# Patient Record
Sex: Male | Born: 1965 | Race: White | Hispanic: No | Marital: Married | Smoking: Current every day smoker
Health system: Southern US, Community
[De-identification: ages and names within clinical notes are randomized; demographics above are authoritative.]

## PROBLEM LIST (undated history)

## (undated) ENCOUNTER — Encounter: Attending: Pediatrics | Primary: Pediatrics

## (undated) ENCOUNTER — Ambulatory Visit: Payer: MEDICAID

## (undated) ENCOUNTER — Encounter

## (undated) ENCOUNTER — Telehealth

## (undated) ENCOUNTER — Ambulatory Visit: Payer: MEDICAID | Attending: Clinical | Primary: Clinical

## (undated) ENCOUNTER — Telehealth: Attending: Pediatrics | Primary: Pediatrics

## (undated) ENCOUNTER — Encounter
Attending: Student in an Organized Health Care Education/Training Program | Primary: Student in an Organized Health Care Education/Training Program

## (undated) ENCOUNTER — Ambulatory Visit: Payer: MEDICAID | Attending: Pediatrics | Primary: Pediatrics

## (undated) ENCOUNTER — Ambulatory Visit

## (undated) ENCOUNTER — Telehealth: Attending: Psychiatry | Primary: Psychiatry

## (undated) ENCOUNTER — Ambulatory Visit: Payer: MEDICAID | Attending: Internal Medicine | Primary: Internal Medicine

## (undated) ENCOUNTER — Ambulatory Visit: Attending: Clinical | Primary: Clinical

## (undated) ENCOUNTER — Ambulatory Visit: Payer: MEDICAID | Attending: Professional | Primary: Professional

## (undated) ENCOUNTER — Inpatient Hospital Stay

## (undated) ENCOUNTER — Ambulatory Visit: Payer: MEDICAID | Attending: Psychiatry | Primary: Psychiatry

## (undated) ENCOUNTER — Ambulatory Visit
Payer: MEDICAID | Attending: Rehabilitative and Restorative Service Providers" | Primary: Rehabilitative and Restorative Service Providers"

## (undated) ENCOUNTER — Encounter: Attending: Dermatology | Primary: Dermatology

## (undated) ENCOUNTER — Ambulatory Visit
Payer: MEDICAID | Attending: Student in an Organized Health Care Education/Training Program | Primary: Student in an Organized Health Care Education/Training Program

## (undated) ENCOUNTER — Encounter: Attending: Psychiatry | Primary: Psychiatry

## (undated) ENCOUNTER — Encounter: Attending: Geriatric Medicine | Primary: Geriatric Medicine

## (undated) ENCOUNTER — Telehealth
Attending: Student in an Organized Health Care Education/Training Program | Primary: Student in an Organized Health Care Education/Training Program

## (undated) ENCOUNTER — Telehealth: Attending: Dental | Primary: Dental

## (undated) ENCOUNTER — Institutional Professional Consult (permissible substitution): Payer: MEDICAID | Attending: Pediatrics | Primary: Pediatrics

## (undated) ENCOUNTER — Ambulatory Visit: Payer: MEDICAID | Attending: Orthopaedic Trauma | Primary: Orthopaedic Trauma

## (undated) ENCOUNTER — Encounter: Payer: MEDICAID | Attending: Pediatrics | Primary: Pediatrics

## (undated) ENCOUNTER — Non-Acute Institutional Stay: Payer: MEDICAID

## (undated) ENCOUNTER — Ambulatory Visit: Payer: BLUE CROSS/BLUE SHIELD

## (undated) DIAGNOSIS — J189 Pneumonia, unspecified organism: Secondary | ICD-10-CM

## (undated) DIAGNOSIS — K219 Gastro-esophageal reflux disease without esophagitis: Secondary | ICD-10-CM

## (undated) DIAGNOSIS — R569 Unspecified convulsions: Secondary | ICD-10-CM

## (undated) DIAGNOSIS — M1990 Unspecified osteoarthritis, unspecified site: Secondary | ICD-10-CM

## (undated) DIAGNOSIS — F319 Bipolar disorder, unspecified: Secondary | ICD-10-CM

## (undated) DIAGNOSIS — I499 Cardiac arrhythmia, unspecified: Secondary | ICD-10-CM

## (undated) DIAGNOSIS — I10 Essential (primary) hypertension: Secondary | ICD-10-CM

## (undated) HISTORY — PX: HAND SURGERY: SHX662

## (undated) MED ORDER — THERA-M TABLET: Freq: Every day | ORAL | 0 days

## (undated) MED ORDER — OLANZAPINE 5 MG TABLET
Freq: Every evening | ORAL | 0 days
Start: ? — End: 2020-03-08

## (undated) MED ORDER — OMEPRAZOLE 20 MG CAPSULE,DELAYED RELEASE: 0 days

## (undated) MED ORDER — SENNA LAX ORAL: Freq: Every day | ORAL | 0.00000 days | Status: SS | PRN

## (undated) MED ORDER — CYCLOBENZAPRINE 10 MG TABLET: Freq: Three times a day (TID) | ORAL | 0 days | Status: SS | PRN

## (undated) MED ORDER — OMEPRAZOLE 20 MG CAPSULE,DELAYED RELEASE
Freq: Every day | ORAL | 0.00000 days | Status: SS
Start: ? — End: 2019-12-26

## (undated) MED ORDER — PSEUDOEPHEDRINE 30 MG TABLET: Freq: Every day | ORAL | 0 days | PRN

## (undated) MED ORDER — CITALOPRAM 10 MG TABLET: Freq: Every day | ORAL | 0.00000 days

## (undated) MED ORDER — IBUPROFEN 400 MG TABLET
Freq: Four times a day (QID) | ORAL | 0.00000 days | PRN
Start: ? — End: 2020-03-08

## (undated) MED ORDER — POTASSIUM GLUCONATE ORAL: Freq: Every day | ORAL | 0 days

## (undated) MED ORDER — PYRIDOXINE (VITAMIN B6) 100 MG TABLET: Freq: Every day | ORAL | 0 days

## (undated) MED ORDER — CALCIUM CARBONATE 200 MG CALCIUM (500 MG) CHEWABLE TABLET: Freq: Every day | ORAL | 0.00000 days | Status: SS | PRN

## (undated) MED ORDER — FOLIC ACID 1 MG TABLET: Freq: Every day | ORAL | 0 days | Status: SS

## (undated) MED ORDER — IBUPROFEN 200 MG TABLET: Freq: Four times a day (QID) | ORAL | 0 days | PRN

## (undated) MED ORDER — CETIRIZINE 10 MG TABLET: Freq: Every day | ORAL | 0 days

## (undated) MED ORDER — CALCIUM 500 + D ORAL: Freq: Every day | ORAL | 0 days

## (undated) MED ORDER — ACETAMINOPHEN 500 MG TABLET: Freq: Four times a day (QID) | ORAL | 0.00000 days | Status: SS | PRN

---
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
## 2012-05-11 DIAGNOSIS — F1021 Alcohol dependence, in remission: Secondary | ICD-10-CM

## 2012-05-11 DIAGNOSIS — F3110 Bipolar disorder, current episode manic without psychotic features, unspecified: Secondary | ICD-10-CM | POA: Diagnosis present

## 2012-05-11 DIAGNOSIS — F319 Bipolar disorder, unspecified: Secondary | ICD-10-CM | POA: Diagnosis present

## 2012-06-08 DIAGNOSIS — F411 Generalized anxiety disorder: Secondary | ICD-10-CM | POA: Diagnosis present

## 2012-06-08 DIAGNOSIS — I10 Essential (primary) hypertension: Secondary | ICD-10-CM

## 2015-06-07 DIAGNOSIS — L4050 Arthropathic psoriasis, unspecified: Secondary | ICD-10-CM | POA: Diagnosis present

## 2016-06-24 DIAGNOSIS — R195 Other fecal abnormalities: Secondary | ICD-10-CM | POA: Insufficient documentation

## 2016-08-03 MED ORDER — SILDENAFIL (PULMONARY HYPERTENSION) 20 MG TABLET
ORAL_TABLET | 5 refills | 0 days | Status: CP
Start: 2016-08-03 — End: 2017-04-22

## 2016-08-21 MED ORDER — OMEPRAZOLE 20 MG CAPSULE,DELAYED RELEASE
ORAL_CAPSULE | 1 refills | 0 days | Status: CP
Start: 2016-08-21 — End: 2016-09-30

## 2016-09-23 ENCOUNTER — Emergency Department
Admission: EM | Admit: 2016-09-23 | Discharge: 2016-09-23 | Disposition: A | Discharge status: Home / Self Care | Source: Intra-hospital | Attending: Emergency Medicine | Admitting: Emergency Medicine

## 2016-09-30 ENCOUNTER — Ambulatory Visit: Admission: RE | Admit: 2016-09-30 | Discharge: 2016-09-30

## 2016-09-30 DIAGNOSIS — R12 Heartburn: Secondary | ICD-10-CM

## 2016-09-30 DIAGNOSIS — F1020 Alcohol dependence, uncomplicated: Secondary | ICD-10-CM

## 2016-09-30 DIAGNOSIS — I10 Essential (primary) hypertension: Secondary | ICD-10-CM

## 2016-09-30 DIAGNOSIS — F419 Anxiety disorder, unspecified: Principal | ICD-10-CM

## 2016-09-30 MED ORDER — FLUTICASONE PROPIONATE 50 MCG/ACTUATION NASAL SPRAY,SUSPENSION
Freq: Every day | NASAL | 12 refills | 0.00000 days | Status: CP
Start: 2016-09-30 — End: 2018-09-09

## 2016-09-30 MED ORDER — AZITHROMYCIN 250 MG TABLET
ORAL_TABLET | 0 refills | 0 days | Status: CP
Start: 2016-09-30 — End: 2018-06-27

## 2016-09-30 MED ORDER — OMEPRAZOLE 20 MG CAPSULE,DELAYED RELEASE
ORAL_CAPSULE | Freq: Every day | ORAL | 12 refills | 0 days | Status: CP
Start: 2016-09-30 — End: 2018-09-09

## 2016-09-30 MED ORDER — CLONAZEPAM 0.5 MG TABLET
ORAL_TABLET | Freq: Two times a day (BID) | ORAL | 0 refills | 0 days | Status: CP | PRN
Start: 2016-09-30 — End: 2018-06-27

## 2016-09-30 MED ORDER — CITALOPRAM 20 MG TABLET
ORAL_TABLET | Freq: Every day | ORAL | 12 refills | 0 days | Status: CP
Start: 2016-09-30 — End: 2018-06-27

## 2016-12-15 MED ORDER — LISINOPRIL 20 MG TABLET
ORAL_TABLET | 2 refills | 0 days | Status: CP
Start: 2016-12-15 — End: 2018-06-27

## 2017-02-03 MED ORDER — ADALIMUMAB 40 MG/0.8 ML SUBCUTANEOUS SYRINGE KIT
SUBCUTANEOUS | 0 refills | 0.00000 days | Status: CP
Start: 2017-02-03 — End: 2017-02-16

## 2017-02-16 ENCOUNTER — Ambulatory Visit: Admit: 2017-02-16 | Discharge: 2017-02-17 | Payer: MEDICAID

## 2017-02-16 DIAGNOSIS — Z79899 Other long term (current) drug therapy: Secondary | ICD-10-CM

## 2017-02-16 DIAGNOSIS — L4050 Arthropathic psoriasis, unspecified: Secondary | ICD-10-CM

## 2017-02-16 DIAGNOSIS — L409 Psoriasis, unspecified: Principal | ICD-10-CM

## 2017-02-16 DIAGNOSIS — L570 Actinic keratosis: Secondary | ICD-10-CM

## 2017-02-16 MED ORDER — ADALIMUMAB 40 MG/0.8 ML SUBCUTANEOUS SYRINGE KIT
SUBCUTANEOUS | 11 refills | 0.00000 days | Status: CP
Start: 2017-02-16 — End: 2018-02-16

## 2017-02-16 MED ORDER — HALOBETASOL PROPIONATE 0.05 % TOPICAL OINTMENT
Freq: Two times a day (BID) | TOPICAL | 5 refills | 0.00000 days | Status: CP
Start: 2017-02-16 — End: 2017-02-16

## 2017-02-16 MED ORDER — HALOBETASOL PROPIONATE 0.05 % TOPICAL OINTMENT: g | Freq: Two times a day (BID) | 5 refills | 0 days | Status: AC

## 2017-04-22 MED ORDER — SILDENAFIL (PULMONARY HYPERTENSION) 20 MG TABLET
ORAL_TABLET | 0 refills | 0 days | Status: CP
Start: 2017-04-22 — End: 2018-09-09

## 2017-08-09 ENCOUNTER — Ambulatory Visit: Admit: 2017-08-09 | Discharge: 2017-08-09 | Disposition: A | Discharge status: Home / Self Care | Payer: MEDICAID

## 2018-04-04 ENCOUNTER — Ambulatory Visit: Admit: 2018-04-04 | Discharge: 2018-04-04 | Disposition: A | Discharge status: Home / Self Care | Payer: MEDICAID

## 2018-04-04 DIAGNOSIS — S39012A Strain of muscle, fascia and tendon of lower back, initial encounter: Principal | ICD-10-CM

## 2018-04-04 DIAGNOSIS — F17210 Nicotine dependence, cigarettes, uncomplicated: Principal | ICD-10-CM

## 2018-04-04 DIAGNOSIS — F419 Anxiety disorder, unspecified: Principal | ICD-10-CM

## 2018-04-04 DIAGNOSIS — M545 Low back pain: Principal | ICD-10-CM

## 2018-04-04 DIAGNOSIS — I10 Essential (primary) hypertension: Principal | ICD-10-CM

## 2018-04-04 MED ORDER — LIDOCAINE 4 % TOPICAL PATCH
MEDICATED_PATCH | Freq: Every day | TRANSDERMAL | 0 refills | 0.00000 days | Status: CP
Start: 2018-04-04 — End: 2018-08-26

## 2018-04-04 MED ORDER — TRAMADOL 50 MG TABLET
ORAL_TABLET | Freq: Four times a day (QID) | ORAL | 0 refills | 0 days | Status: CP | PRN
Start: 2018-04-04 — End: 2018-04-09

## 2018-05-23 ENCOUNTER — Telehealth: Admit: 2018-05-23 | Discharge: 2018-05-24

## 2018-05-23 DIAGNOSIS — R1110 Vomiting, unspecified: Secondary | ICD-10-CM

## 2018-05-23 DIAGNOSIS — I10 Essential (primary) hypertension: Secondary | ICD-10-CM

## 2018-05-23 DIAGNOSIS — R509 Fever, unspecified: Principal | ICD-10-CM

## 2018-05-24 ENCOUNTER — Ambulatory Visit: Admit: 2018-05-24 | Discharge: 2018-05-25 | Payer: MEDICAID

## 2018-05-24 DIAGNOSIS — F1010 Alcohol abuse, uncomplicated: Secondary | ICD-10-CM

## 2018-05-24 DIAGNOSIS — Z1383 Encounter for screening for respiratory disorder NEC: Principal | ICD-10-CM

## 2018-06-14 MED ORDER — VANCOMYCIN 125 MG CAPSULE
ORAL_CAPSULE | Freq: Four times a day (QID) | ORAL | 0 refills | 0.00000 days | Status: CP
Start: 2018-06-14 — End: 2018-08-26

## 2018-06-21 ENCOUNTER — Ambulatory Visit: Admit: 2018-06-21 | Discharge: 2018-06-22 | Payer: MEDICAID | Attending: Internal Medicine | Primary: Internal Medicine

## 2018-06-21 ENCOUNTER — Ambulatory Visit: Admit: 2018-06-21 | Discharge: 2018-06-22 | Disposition: A | Discharge status: Home / Self Care | Payer: MEDICAID

## 2018-06-21 DIAGNOSIS — I951 Orthostatic hypotension: Principal | ICD-10-CM

## 2018-06-27 ENCOUNTER — Ambulatory Visit: Admit: 2018-06-27 | Discharge: 2018-06-28 | Payer: MEDICAID | Attending: Pediatrics | Primary: Pediatrics

## 2018-06-27 DIAGNOSIS — D531 Other megaloblastic anemias, not elsewhere classified: Secondary | ICD-10-CM

## 2018-06-27 DIAGNOSIS — R630 Anorexia: Principal | ICD-10-CM

## 2018-06-27 DIAGNOSIS — A0472 Enterocolitis due to Clostridium difficile, not specified as recurrent: Secondary | ICD-10-CM

## 2018-06-27 DIAGNOSIS — F1010 Alcohol abuse, uncomplicated: Secondary | ICD-10-CM

## 2018-06-27 DIAGNOSIS — I951 Orthostatic hypotension: Secondary | ICD-10-CM

## 2018-06-30 ENCOUNTER — Ambulatory Visit: Admit: 2018-06-30 | Discharge: 2018-07-01 | Payer: MEDICAID | Attending: Pediatrics | Primary: Pediatrics

## 2018-06-30 DIAGNOSIS — I426 Alcoholic cardiomyopathy: Secondary | ICD-10-CM

## 2018-06-30 DIAGNOSIS — F1020 Alcohol dependence, uncomplicated: Principal | ICD-10-CM

## 2018-06-30 MED ORDER — NALTREXONE 50 MG TABLET
ORAL_TABLET | Freq: Every day | ORAL | 11 refills | 0.00000 days | Status: CP
Start: 2018-06-30 — End: 2018-08-26

## 2018-06-30 MED ORDER — CHLORDIAZEPOXIDE 25 MG CAPSULE
ORAL_CAPSULE | Freq: Two times a day (BID) | ORAL | 0 refills | 0.00000 days | Status: CP
Start: 2018-06-30 — End: 2018-07-15

## 2018-07-11 ENCOUNTER — Ambulatory Visit: Admit: 2018-07-11 | Discharge: 2018-07-12 | Payer: MEDICAID

## 2018-07-11 DIAGNOSIS — I426 Alcoholic cardiomyopathy: Principal | ICD-10-CM

## 2018-07-11 MED ORDER — NEOMYCIN-POLYMYXIN-HYDROCORT 3.5 MG-10,000 UNIT/ML-1 % EAR DROPS,SUSP
Freq: Three times a day (TID) | OTIC | 0 refills | 0.00000 days | Status: CP
Start: 2018-07-11 — End: 2018-07-18

## 2018-07-15 ENCOUNTER — Ambulatory Visit: Admit: 2018-07-15 | Discharge: 2018-07-16 | Payer: MEDICAID | Attending: Pediatrics | Primary: Pediatrics

## 2018-07-15 DIAGNOSIS — H60312 Diffuse otitis externa, left ear: Principal | ICD-10-CM

## 2018-07-15 DIAGNOSIS — F1010 Alcohol abuse, uncomplicated: Secondary | ICD-10-CM

## 2018-07-15 MED ORDER — CHLORDIAZEPOXIDE 25 MG CAPSULE: 25 mg | capsule | Freq: Two times a day (BID) | 0 refills | 0 days | Status: AC

## 2018-07-15 MED ORDER — CHLORDIAZEPOXIDE 25 MG CAPSULE
ORAL_CAPSULE | Freq: Two times a day (BID) | ORAL | 0 refills | 0.00000 days | Status: CP
Start: 2018-07-15 — End: 2018-08-26

## 2018-07-15 MED ORDER — CEPHALEXIN 500 MG CAPSULE
ORAL_CAPSULE | Freq: Three times a day (TID) | ORAL | 0 refills | 0 days | Status: CP
Start: 2018-07-15 — End: 2018-07-25

## 2018-08-07 DIAGNOSIS — S0990XA Unspecified injury of head, initial encounter: Principal | ICD-10-CM

## 2018-08-08 ENCOUNTER — Emergency Department
Admit: 2018-08-08 | Discharge: 2018-08-08 | Disposition: A | Discharge status: Home / Self Care | Payer: MEDICAID | Attending: Emergency Medicine

## 2018-08-08 ENCOUNTER — Ambulatory Visit
Admit: 2018-08-08 | Discharge: 2018-08-08 | Disposition: A | Discharge status: Home / Self Care | Payer: MEDICAID | Attending: Emergency Medicine

## 2018-08-24 ENCOUNTER — Ambulatory Visit: Admit: 2018-08-24 | Discharge: 2018-08-25 | Payer: MEDICAID | Attending: Pediatrics | Primary: Pediatrics

## 2018-08-24 ENCOUNTER — Ambulatory Visit: Admit: 2018-08-24 | Discharge: 2018-08-25 | Payer: MEDICAID

## 2018-08-24 DIAGNOSIS — I951 Orthostatic hypotension: Secondary | ICD-10-CM

## 2018-08-24 DIAGNOSIS — M25532 Pain in left wrist: Principal | ICD-10-CM

## 2018-08-24 DIAGNOSIS — F1020 Alcohol dependence, uncomplicated: Secondary | ICD-10-CM

## 2018-08-24 DIAGNOSIS — Z1211 Encounter for screening for malignant neoplasm of colon: Secondary | ICD-10-CM

## 2018-08-24 DIAGNOSIS — R000 Tachycardia, unspecified: Principal | ICD-10-CM

## 2018-08-24 DIAGNOSIS — R5383 Other fatigue: Secondary | ICD-10-CM

## 2018-08-26 ENCOUNTER — Ambulatory Visit: Admit: 2018-08-26 | Discharge: 2018-08-27 | Payer: MEDICAID

## 2018-08-26 DIAGNOSIS — L409 Psoriasis, unspecified: Principal | ICD-10-CM

## 2018-08-26 DIAGNOSIS — Z79899 Other long term (current) drug therapy: Secondary | ICD-10-CM

## 2018-08-26 MED ORDER — HALOBETASOL PROPIONATE 0.05 % TOPICAL OINTMENT
Freq: Two times a day (BID) | TOPICAL | 10 refills | 0.00000 days | Status: CP
Start: 2018-08-26 — End: ?

## 2018-08-26 MED ORDER — ADALIMUMAB SYRINGE CITRATE FREE 40 MG/0.4 ML
SUBCUTANEOUS | 11 refills | 0.00000 days | Status: CP
Start: 2018-08-26 — End: ?
  Filled 2018-09-07: qty 4, 35d supply, fill #0

## 2018-08-29 NOTE — Unmapped (Signed)
Per test claim for Humira at the [facility], patient needs Medication Assistance Program for Prior Authorization.

## 2018-08-29 NOTE — Unmapped (Signed)
[facility] Specialty Medication Referral: [place] Approved      Medication (Brand/Generic): HUMIRA    Final Test Claim completed with resulted information below:    Patient ABLE to fill at [facility] Company:  [place] MEDICAID  Anticipated Copay: $3  Is anticipated copay with a copay card or grant? No, there is no need for grant or copay assistance.     Does this patient have to receive a partial fill of the medication due to insurance restrictions? NO  If so, please cofirm how many days supply is allowed per plan per fill and how long the patient will have to fill partial months supply for the medication: NOT APPLICABLE     If the copay is under the $25 defined limit, per policy there will be no further investigation of need for financial assistance at this time unless patient requests. This referral has been communicated to the provider and handed off to the [facility] team for further processing and filling of prescribed medication.   ______________________________________________________________________  Please utilize this referral for viewing purposes as it will serve as the central location for all relevant documentation and updates.

## 2018-08-31 LAB — QUANTIFERON TB GOLD PLUS
QUANTIFERON ANTIGEN 1 MINUS NIL: 0.04 [IU]/mL
QUANTIFERON MITOGEN: 9.95 [IU]/mL
QUANTIFERON TB GOLD PLUS: NEGATIVE
QUANTIFERON TB NIL VALUE: 0.05 [IU]/mL

## 2018-08-31 LAB — QUANTIFERON MITOGEN: Lab: 9.95

## 2018-08-31 LAB — TB NIL VALUE: Lab: 0.05

## 2018-08-31 LAB — TB AG1 VALUE: Lab: 0.09

## 2018-08-31 LAB — TB AG2 VALUE: Lab: 0.06

## 2018-08-31 LAB — TB MITOGEN VALUE: Lab: 10

## 2018-08-31 NOTE — Unmapped (Signed)
Informed patient via MyChart of good labs. OK to restart medication.

## 2018-09-02 NOTE — Unmapped (Signed)
Patient's wife called in wants to know if the prescription was sent over for the Humira (adalimumab)

## 2018-09-02 NOTE — Unmapped (Signed)
[facility]   Patient Onboarding/Medication Counseling    Nicholas Rush is a 53 y.o. male with psoriasi who I am counseling today on re-initiation of therapy.  I am speaking to the patient.    Verified patient's date of birth / HIPAA.    Specialty medication(s) to be sent: Inflammatory Disorders: Humira      Non-specialty medications/supplies to be sent: sharps kit      Medications not needed at this time: na         Humira (adalimumab)    Medication & Administration     Dosage: Plaque psoriasis: Inject 80mg  under the skin on day 1, then 40mg  every 14 days starting on day 8    Lab tests required prior to treatment initiation:  ? Tuberculosis: Tuberculosis screening resulted in a non-reactive Quantiferon TB Gold assay.  ? Hepatitis B: Hepatitis B serology studies are complete and non-reactive.    Administration:     Prefilled syringe  1. Gather all supplies needed for injection on a clean, flat working surface: medication syringe(s) removed from packaging, alcohol swab, sharps container, etc.  2. Look at the medication label - look for correct medication, correct dose, and check the expiration date  3. Look at the medication - the liquid in the syringe should appear clear and colorless  4. Lay the syringe on a flat surface and allow it to warm up to room temperature for at least 30-45 minutes  5. Select injection site - you can use the front of your thigh or your belly (but not the area 2 inches around your belly button)  6. Prepare injection site - wash your hands and clean the skin at the injection site with an alcohol swab and let it air dry, do not touch the injection site again before the injection  7. Pull off the needle safety cap, do not remove until immediately prior to injection; turn the syringe so the needle is facing up and hold the syringe at eye level with one hand so you can see the air in the syringe; using your other hand, slowly push the plunger in to push the air out through the needle  8. Pinch the skin - with your hand not holding the syringe pinch up a fold of skin at the injection site using your forefinger and thumb  9. Insert the needle into the fold of skin at about a 45 degree angle - it's best to use a quick dart-like motion  10.Push the plunger down slowly as far as it will go until the syringe is empty, hold the syringe in place for a full 5 seconds  11. Check that the syringe is empty and pull the needle out at the same angle as inserted  12. Dispose of the used syringe immediately in your sharps disposal container, do not attempt to recap the needle prior to disposing  13. If you see any blood at the injection site, press a cotton ball or gauze on the site and maintain pressure until the bleeding stops, do not rub the injection site    Adherence/Missed dose instructions:  If your injection is given more than 3 days after your scheduled injection date ??? consult your pharmacist for additional instructions on how to adjust your dosing schedule.    Goals of Therapy     - Achieve remission/inactive disease or low/minimal disease activity  - Maintenance of function  - Minimization of systemic manifestations and comorbidities  - Maintenance of  effective psychosocial functioning    Side Effects & Monitoring Parameters     ? Injection site reaction (redness, irritation, inflammation localized to the site of administration)  ? Signs of a common cold ??? minor sore throat, runny or stuffy nose, etc.  ? Upset stomach  ? Headache    The following side effects should be reported to the provider:  ? Signs of a hypersensitivity reaction ??? rash; hives; itching; red, swollen, blistered, or peeling skin; wheezing; tightness in the chest or throat; difficulty breathing, swallowing, or talking; swelling of the mouth, face, lips, tongue, or throat; etc.  ? Reduced immune function ??? report signs of infection such as fever; chills; body aches; very bad sore throat; ear or sinus pain; cough; more sputum or change in color of sputum; pain with passing urine; wound that will not heal, etc.  Also at a slightly higher risk of some malignancies (mainly skin and blood cancers) due to this reduced immune function.  o In the case of signs of infection ??? the patient should hold the next dose of Humira?? and call your primary care provider to ensure adequate medical care.  Treatment may be resumed when infection is treated and patient is asymptomatic.  ? Changes in skin ??? a new growth or lump that forms; changes in shape, size, or color of a previous mole or marking  ? Signs of unexplained bruising or bleeding ??? throwing up blood or emesis that looks like coffee grounds; black, tarry, or bloody stool; etc.  ? Signs of new or worsening heart failure ??? shortness of breath; sudden weight gain; heartbeat that is not normal; swelling in the arms or legs that is new or worse      Contraindications, Warnings, & Precautions     ? Have your bloodwork checked as you have been told by your prescriber  ? Talk with your doctor if you are pregnant, planning to become pregnant, or breastfeeding  ? Discuss the possible need for holding your dose(s) of Humira?? when a planned procedure is scheduled with the prescriber as it may delay healing/recovery timeline       Drug/Food Interactions     ? Medication list reviewed in Epic. The patient was instructed to inform the care team before taking any new medications or supplements. No drug interactions identified.   ? Talk with you prescriber or pharmacist before receiving any live vaccinations while taking this medication and after you stop taking it    Storage, Handling Precautions, & Disposal     ? Store this medication in the refrigerator.  Do not freeze  ? If needed, you may store at room temperature for up to 14 days  ? Store in [employer], protected from light  ? Do not shake  ? Dispose of used syringes/pens in a sharps disposal container            Current Medications (including OTC/herbals), Comorbidities and Allergies     Current Outpatient Medications   Medication Sig Dispense Refill   ??? ADALIMUMAB SYRINGE CITRATE FREE 40 MG/0.4 ML Inject the contents of 2 syringes (80 mg) under the skin for dose 1. Then inject the contents of 1 syringe (40 mg) every 14 days. 4 each 11   ??? fluticasone (FLONASE) 50 mcg/actuation nasal spray 2 sprays by Each Nare route daily. 16 g 12   ??? halobetasol (ULTRAVATE) 0.05 % ointment Apply topically Two (2) times a day. To affected areas as needed. 60 g 10   ???  omeprazole (PRILOSEC) 20 MG capsule Take 1 capsule (20 mg total) by mouth daily. 30 capsule 12   ??? sildenafil, antihypertensive, (REVATIO) 20 mg tablet take 1-5 tablets by mouth as needed 30 minutes prior to intercourse. max 5 tablets per day 30 tablet 0     No current facility-administered medications for this visit.        Allergies   Allergen Reactions   ??? Hydroxyzine      Sedation.  Ineffective for reducing anxiety.       Patient Active Problem List   Diagnosis   ??? Depressive disorder, not elsewhere classified   ??? Anxiety state   ??? Essential hypertension   ??? Insomnia   ??? Psoriasis   ??? Alcohol abuse, episodic   ??? Psoriatic arthritis (CMS-HCC)   ??? Positive colorectal cancer screening using DNA-based stool test   ??? Diarrhea of presumed infectious origin       Reviewed and up to date in Epic.    Appropriateness of Therapy     Is medication and dose appropriate based on diagnosis? Yes    Baseline Quality of Life Assessment      How many days over the past month did your psoriasis keep you from your normal activities? 0    Financial Information     Medication Assistance provided: Prior Authorization    Anticipated copay of $3 reviewed with patient. Verified delivery address.    Delivery Information     Scheduled delivery date: Wed, Aug 19    Expected start date: Wed, Aug 19    Medication will be delivered via Same Day Courier to the home address in [place].  This shipment will not require a signature.      Explained the services we provide at [facility] and that each month we would call to set up refills.  Stressed importance of returning phone calls so that we could ensure they receive their medications in time each month.  Informed patient that we should be setting up refills 7-10 days prior to when they will run out of medication.  A pharmacist will reach out to perform a clinical assessment periodically.  Informed patient that a welcome packet and a drug information handout will be sent.      Patient verbalized understanding of the above information as well as how to contact the pharmacy at [phone] option 4 with any questions/concerns.  The pharmacy is open Monday through Friday 8:30am-4:30pm.  A pharmacist is available 24/7 via pager to answer any clinical questions they may have.    Patient Specific Needs     ? Does the patient have any physical, cognitive, or cultural barriers? No    ? Patient prefers to have medications discussed with  Patient     ? Is the patient able to read and understand education materials at a high school level or above? No    ? Patient's primary language is  English     ? Is the patient high risk? No     ? Does the patient require a Care Management Plan? No     ? Does the patient require physician intervention or other additional services (i.e. nutrition, smoking cessation, social work)? No      Zackery Brine A Desiree Lucy [facility] Specialty Pharmacist

## 2018-09-02 NOTE — Unmapped (Signed)
I called Nicholas Rush to let him know the prescription was sent to [facility]. I let him know that you (Meghan) tried to call him today. I provided the [facility] phone number as well.

## 2018-09-06 MED ORDER — EMPTY CONTAINER
2 refills | 0 days
Start: 2018-09-06 — End: ?

## 2018-09-07 MED FILL — HUMIRA SYRINGE CITRATE FREE 40 MG/0.4 ML: 35 days supply | Qty: 4 | Fill #0 | Status: AC

## 2018-09-07 MED FILL — EMPTY CONTAINER: 120 days supply | Qty: 1 | Fill #0 | Status: AC

## 2018-09-07 MED FILL — EMPTY CONTAINER: 120 days supply | Qty: 1 | Fill #0

## 2018-09-12 MED ORDER — FLUTICASONE PROPIONATE 50 MCG/ACTUATION NASAL SPRAY,SUSPENSION
Freq: Every day | NASAL | 12 refills | 0 days | Status: CP
Start: 2018-09-12 — End: ?

## 2018-09-12 MED ORDER — SILDENAFIL (PULMONARY HYPERTENSION) 20 MG TABLET
ORAL_TABLET | 5 refills | 0 days | Status: CP
Start: 2018-09-12 — End: ?

## 2018-09-12 MED ORDER — OMEPRAZOLE 20 MG CAPSULE,DELAYED RELEASE
ORAL_CAPSULE | Freq: Every day | ORAL | 12 refills | 30 days | Status: CP
Start: 2018-09-12 — End: ?

## 2018-09-12 NOTE — Unmapped (Signed)
Patient called asking about his CPAP supplies. I see nothing ordered at this time, I do see the study done 07/04/2015 but no supplies ordered recently

## 2018-09-14 NOTE — Unmapped (Signed)
TC to patient to discover if he has Cpap and where he obtains his supplies for ordering.  Left message

## 2018-09-16 ENCOUNTER — Ambulatory Visit: Admit: 2018-09-16 | Discharge: 2018-09-17 | Payer: MEDICAID

## 2018-09-16 DIAGNOSIS — Z20828 Contact with and (suspected) exposure to other viral communicable diseases: Principal | ICD-10-CM

## 2018-09-16 NOTE — Unmapped (Signed)
Patient scheduled for C-19 testing.

## 2018-09-16 NOTE — Unmapped (Signed)
Assessment     Nicholas Rush is a 53 y.o. male presenting to [facility] for COVID testing.     Plan     If no testing performed, pt counseled on routine care for respiratory illness.  If testing performed, COVID sent.  Patient directed to Home given findings during today's visit.    Subjective     Nicholas Rush is a 53 y.o. male who [facility] with complaints of the following:    Exposure History: In the last 21 days?     Have you traveled outside of [place]? No               Have you been in close contact with someone confirmed by a test to have COVID? (Close contact is within 6 feet for at least 10 minutes) Yes, Who? Not answered       Have you worked in a health care facility? No     Lived or worked facility like a nursing home, group home, or assisted living?    No         Are you scheduled to have surgery or a procedure in the next 3 days? No               Are you scheduled to receive cancer chemotherapy within the next 7 days?    No     Have you ever been tested before for COVID-19 with a swab of your nose? Yes: When: Not answered, Where: Not answered   Are you a healthcare worker being tested so to return to work No         Right now,  do you have any of the following that developed over the past 7 days (as stated by patient on intake form):    Subjective fever (felt feverish) Yes, how many days? 14   Chills (especially repeated shaking chills) Yes, how many days? 14   Severe fatigue (felt very tired) Yes, how many days? 14   Muscle aches No   Runny nose Yes, how many days? 14   Sore throat No   Loss of taste or smell No   Cough (new onset or worsening of chronic cough) Yes, how many days? 14   Shortness of breath Yes, how many days? 14   Nausea or vomiting Yes, how many days? 21   Headache No   Abdominal Pain No   Diarrhea (3 or more loose stools in last 24 hours) No     History/Medical Conditions (as stated by patient on intake form):    Do you have any of the following:   Asthma or emphysema or COPD No   Cystic Fibrosis No   Diabetes No   High Blood Pressure  Yes   Cardiovascular Disease No   Chronic Kidney Disease No   Chronic Liver Disease No   Chronic blood disorder like Sickle Cell Disease  No   Weak immune system due to disease or medication No   Neurologic condition that limits movement  No   Developmental delay - Moderate to Severe  No   Recent (within past 2 weeks) or current Pregnancy No   Morbid Obesity (>100 pounds over ideal weight) No   Current Smoker Yes   Former Smoker Yes       Objective     Given above, testing performed: Yes    Testing Performed:  Test Specimen Type Sent to  COVID-19  [facility]       Scribe's Attestation: Candie Chroman, DO obtained and performed the history, physical exam and medical decision making elements that were entered into the chart.  Signed by Mal Amabile, LCSW serving as Scribe, on 09/16/2018 12:14 PM

## 2018-09-16 NOTE — Unmapped (Signed)
~  6/28 - negative

## 2018-09-16 NOTE — Unmapped (Signed)
Do you have any of the following conditions?   ? Heart or Lung condition: No  ? Hypertension: Yes  ? Diabetes: No  ? Congestive Heart Disease: No  ? COPD: No  ? Asthma: No  ? HIV: No  ? Chronic kidney disease (renal failure): No  ? Liver disease: No  ? Cystic Fibrosis: No  ? Sickle Cell Anemia or other blood disorder: No  ? Neurologic disease that limits movement: No  ? Moderate to severe developmental delay: No  ? Usage of Immunosuppressant medications (used in transplant or Lupus patients): Taking [place]  ? Usage of Chemotherapy medications: No  ? Long-term steroid usage:No  ? Pregnant or 2 weeks post partum No  BMI > 30: Yes (         08/24/18 24.49 kg/m??   ? )  ? [employer] Student: No  ? COVID sxs (Fever, Cough, SOB, Chills, Muscle pain, Loss of taste or smell, Vomiting or diarrhea, Sore throat): Yes        COVID Testing  ? Have you been COVID tested: Yes  o If Yes: was it an antibody test: No  ? What was the date of your COVID test: ~ 6/28  ? What was the result of your COVID test: negative      Reason for Disposition  ??? [1] COVID-19 infection suspected by caller or triager AND [2] mild symptoms (cough, fever, or others) AND [3] no complications or SOB    Answer Assessment - Initial Assessment Questions  1. COVID-19 DIAGNOSIS: Who made your Coronavirus (COVID-19) diagnosis? Was it confirmed by a positive lab test? If not diagnosed by a HCP, ask Are there lots of cases (community spread) where you live? (See public health department website, if unsure)      States his girlfriend, with whom he lives, was tested positive for C-19 on 8/26. Lives in [place].  2. ONSET: When did the COVID-19 symptoms start?       ~ 7/28.  3. WORST SYMPTOM: What is your worst symptom? (e.g., cough, fever, shortness of breath, muscle aches)      Sweating and DOE  4. COUGH: Do you have a cough? If so, ask: How bad is the cough?        Dry and productive cough with yellow/green phlegm.  5. FEVER: Do you have a fever? If so, ask: What is your temperature, how was it measured, and when did it start?      Felt chills, sweating and warm spells but has not taken his temperature.  6. RESPIRATORY STATUS: Describe your breathing? (e.g., shortness of breath, wheezing, unable to speak)       States he's had DOE.  7. BETTER-SAME-WORSE: Are you getting better, staying the same or getting worse compared to yesterday?  If getting worse, ask, In what way?      The same  8. HIGH RISK DISEASE: Do you have any chronic medical problems? (e.g., asthma, heart or lung disease, weak immune system, etc.)      HTN, Arthritis  9. PREGNANCY: Is there any chance you are pregnant? When was your last menstrual period?      n/a  10. OTHER SYMPTOMS: Do you have any other symptoms?  (e.g., chills, fatigue, headache, loss of smell or taste, muscle pain, sore throat)        DOE, some dizziness, cough    Protocols used: CORONAVIRUS (COVID-19) DIAGNOSED OR SUSPECTED-ADULT-[place]

## 2018-09-19 NOTE — Unmapped (Signed)
Patient called and requested a phone call from Dr. Mayford Knife, didn't say what it was about.

## 2018-09-20 NOTE — Unmapped (Signed)
Thayer Ohm -- would you mind calling him back?    Thanks, SW

## 2018-09-21 ENCOUNTER — Ambulatory Visit: Admit: 2018-09-21 | Discharge: 2018-09-22 | Discharge status: Against Medical Advice | Payer: MEDICAID

## 2018-09-21 DIAGNOSIS — Z5321 Procedure and treatment not carried out due to patient leaving prior to being seen by health care provider: Secondary | ICD-10-CM

## 2018-09-21 DIAGNOSIS — R42 Dizziness and giddiness: Secondary | ICD-10-CM

## 2018-09-22 NOTE — Unmapped (Signed)
Pt BIB [facility] after falling next to the liquor store. Pt has no complaint of pain but states he is dizzy. NAD noted.

## 2018-09-27 ENCOUNTER — Ambulatory Visit: Admit: 2018-09-27 | Discharge: 2018-09-28 | Payer: MEDICAID | Attending: Pediatrics | Primary: Pediatrics

## 2018-09-27 DIAGNOSIS — F1020 Alcohol dependence, uncomplicated: Secondary | ICD-10-CM

## 2018-09-27 DIAGNOSIS — I10 Essential (primary) hypertension: Secondary | ICD-10-CM

## 2018-09-27 DIAGNOSIS — R197 Diarrhea, unspecified: Secondary | ICD-10-CM

## 2018-09-27 NOTE — Unmapped (Signed)
Recommend ASAp    Recommend alcohol cessation

## 2018-09-29 ENCOUNTER — Ambulatory Visit: Admit: 2018-09-29 | Discharge: 2018-09-30 | Payer: MEDICAID

## 2018-09-29 DIAGNOSIS — R197 Diarrhea, unspecified: Secondary | ICD-10-CM

## 2018-09-29 NOTE — Unmapped (Signed)
Opened in error

## 2018-09-30 DIAGNOSIS — A0472 Enterocolitis due to Clostridium difficile, not specified as recurrent: Secondary | ICD-10-CM

## 2018-09-30 MED ORDER — VANCOMYCIN 125 MG CAPSULE
ORAL_CAPSULE | Freq: Four times a day (QID) | ORAL | 0 refills | 10.00000 days | Status: CP
Start: 2018-09-30 — End: ?

## 2018-09-30 NOTE — Unmapped (Signed)
Positive stool/ for c diff    Will treat with course of vancomycin

## 2018-09-30 NOTE — Unmapped (Signed)
Assessment/Plan:        Diagnoses and all orders for this visit:    Essential hypertension  Patient with essential hypertension which is much improved.  He is off medication his problems in the past have been orthostasis with alcohol intoxication and alcoholism.  I feel it is safer for him to be off of medication at this time.  Blood pressure is reasonable at 146/80    Diarrhea of presumed infectious origin  Diarrhea with close contact to partner who has C. difficile colitis we will check a C. difficile assay and patient is having loose stools.  -     C. Difficile Assay; Future    Alcoholism (CMS-HCC)  Patient with history of alcoholism and continues to drink.  I discussed with him the benefits of quitting and we discussed recovery with Alcoholics Anonymous as well as potential rehab.  Patient is not interested in inpatient stay for rehab but is interested in outpatient recovery and will be referred to [facility] alcohol and substance abuse panel along with prescribed recovery meetings.  -     Ambulatory referral to Psychiatry; Future    Other orders  -     INFLUENZA VACCINE (QUAD) IM - 6 MO-ADULT - PF          Subjective:     Patient ID: Nicholas Rush is a 53 y.o. male.    HPI 53 year old gentleman who presents for routine follow-up of essential hypertension and alcohol abuse with alcoholism.  He continues to drink alcohol despite previous discussions and commitments not to.  Is not currently going to AA meetings but does admit to try to limit his drinking.  He reports that he drinks anywhere from 4-6 drinks daily.  Patient was seen previously with balance problems and was also having difficulty with orthostasis and dehydration.  Patient has had previous admissions to rehab and is currently not interested in revisiting this plan.  He had history of hypertension but because of frequent falls and orthostasis his medication was discontinued and he seems to be doing quite well without this he has not had any falls and denies any chest pain or shortness of breath.  He is however having abdominal discomfort and some mild watery stools.  Of note his partner has been diagnosed him with C. difficile colitis.  Patient denies any blood in stool, nausea, vomiting, dizziness or fever.  The following portions of the patient's history were reviewed and updated as appropriate: allergies, current medications, past family history, past medical history, past social history, past surgical history and problem list.      Review of Systems  ROS:  Constitutional: denies fever,chills, weight loss.   Vision: Denies double vision, eye pain, blindness  Head: Denies HAs  Resp: Denies cough,SOB, DOE or wheeze  CV: Denies CP/ SOB, LE edema or palpitation  GI: Denies ABD pain, distention, Nausea, vomiting as noted above positive for diarrhea  GU: Denies flank pain, dysuria, hematuria  MSK: Denies myalgia, arthralgias or joint swelling  Integument: Denies rash, pallor, bruising  Neuro: Denies seizures, Headaches, dizziness, speech difficulty  Psychiatry:Denies depressive sx, anxiety, SI or HI  Endocrine: Denies polyuria/polydipsia/ heat or cold intolerance        Objective:    Physical Exam      Physical Exam:  GENERAL APPEARANCE: No acute distress.  Slightly disheveled  HEENT: Sclerae are clear. TMs are normal. Nares are clear. Oropharynx is clear.   NECK: Supple, no thyromegaly,carotid bruits or adenopathy.  CARDIOVASCULAR: Regular rate and rhythm. No significant murmur, gallop or ectopy.   LUNGS: Clear to auscultation bilaterally.  ABDOMEN: No organomegaly, masses or tenderness.   EXTREMITIES: No edema, good peripheral pulses.   LYMPHATICS: No cervical, axillary or supraclavicular lymphadenopathy noted.  SKIN: No rash.  NEUROLOGIC: CN Rush-XII intact. No focal motor or sensory deficits noted. Cerebellar function normal. Gait normal.

## 2018-10-03 ENCOUNTER — Institutional Professional Consult (permissible substitution): Admit: 2018-10-03 | Discharge: 2018-10-04 | Payer: MEDICAID | Attending: Clinical | Primary: Clinical

## 2018-10-03 DIAGNOSIS — I10 Essential (primary) hypertension: Secondary | ICD-10-CM

## 2018-10-03 DIAGNOSIS — F419 Anxiety disorder, unspecified: Secondary | ICD-10-CM

## 2018-10-03 DIAGNOSIS — F1020 Alcohol dependence, uncomplicated: Secondary | ICD-10-CM

## 2018-10-03 DIAGNOSIS — F329 Major depressive disorder, single episode, unspecified: Secondary | ICD-10-CM

## 2018-10-03 NOTE — Unmapped (Signed)
[facility] ASAP  Comprehensive Clinical Assessment    Name: Nicholas Rush  Date: 10/03/2018  MRN: [id number]  DOB: Nov 13, 1965  PCP: Blake Divine, MD    Type of Service: Biopsychosocial Assessment (Comprehensive Clinical Assessment).  PROCEDURE CODE:  84132    Purpose: The purpose of the session was to complete a biopsychosocial assessment and initiate services, if appropriate.    Psychiatric Chief Concern:  Initial evaluation    HPI: Patient is a 53 y.o., White or Caucasian race, Not Hispanic or Latino ethnicity,  ENGLISH speaking male  with a history of alcohol abuse, depression and anxiety.  He is seeking treatment for his alcohol use which he has found problematic.  His SO also is concerned about his health.  Patient has been advised to seek a medical detox facility before he stops drinking to avoid any risk of seizures.    Mental Status Exam:  Appearance:    Unable to Assess by Phone   Motor:   Unable to Assess by Phone   Speech/Language:    Normal rate, volume, tone, fluency   Mood:   Calm   Affect:   Cooperative   Thought process:   Logical, linear, clear, coherent, goal directed   Thought content:     Denies SI, HI, self harm, delusions, obsessions, paranoid ideation, or ideas of reference   Perceptual disturbances:     Denies auditory and visual hallucinations, behavior not concerning for response to internal stimuli     Orientation:   Oriented to person, place, time, and general circumstances   Attention:   Able to fully attend without fluctuations in consciousness   Concentration:   Able to fully concentrate and attend   Memory:   Immediate, short-term, long-term, and recall grossly intact    Fund of knowledge:    Consistent with level of education and development   Insight:     Impaired   Judgment:    Impaired   Impulse Control:   Fair       Psychiatric History:     []  Prior MH Tx:  ? When:   ? Where:   ? Reason:   ? Outcomes:   []  Prior SUD Tx:  ? When:   ? Where:   ? Reason:   ? Outcomes:   []  Prior psychiatric hospitalization(s):  ? When:   ? Where:   ? Reason:   ? Outcomes:     Diagnosis Given:       Psychiatric Symptoms     Medications Taken:      History of Medication Adherence:     Mood: []  Patient endorsed feeling sad, blue, down, depressed, worthless and/or hopeless.   []  Pt endorsed feeling the opposite of depressed, very cheerful or happy, racing thoughts, need for little to no sleep without missing it, and this felt different from their normal self.    Psychosis: []  Pt endorsed audio hallucinations.  []  Pt endorsed visual hallucinations.  []  Pt endorsed that people are out to get him/her or are being followed by people.     Anxiety: []  Pt endorsed being worried about several things in his/her life.   []  Pt endorsed having a hard time controlling or stopping his/her worrying.   []  Pt endorsed that ADLs are negatively impacted by anxiety.   []  Pt endorsed experiencing s/sx of panic attacks.     Obsessions/Compulsions: []  Pt endorsed having unwanted thoughts, urges, and/or images.   []  Pt endorsed having to perform physical  acts to avoid or reduce distress.     Dissociation: []  Pt endorsed losing time, forgetting important details about her- or his-self, or found evidence that s/he had participated in events for which cannot be recalled.   []  Pt endorsed feeling like people or places that are familiar are unreal, or that s/he are detached from his or her body as though they are watching her- or him-self in a movie.    Somatic: []  Pt endorsed worrying about physical health more than most people.   []  Pt endorsed getting sick more than most people.    Eating/Feeding: []  Pt reported being dissatisfied with their weight.  []  Pt endorsed restricting or avoiding particular foods so much that it negatively affects their health or weight.  []  Pt endorsed binge eating large portions of food, eating rapidly, and/or a sense of lack of control over eating.  []  Pt endorsed vomiting, using laxatives, diuretics, and/or excessive exercise to prevent weight gain.    Sleeping: []  Pt endorsed inadequate or poor quality sleep.  []  Pt endorsed signs or sx of sleep apnea.    Personality: []  Pt endorsed traits of Cluster A personality disorders.  []  Pt endorsed traits of Cluster B personality disorders.  []  Pt endorsed traits of Cluster C personality disorders.     Elimination: []  Pt endorsed passing urine or feces onto objects, clothing, or places.    Suicidal Ideations or Attempts: Pt denies any past or current Ideation or attempts.  No self harm.    Past Attempts:       Past and Current Ideation, plan, intent:      Non-suicidal Self-Harm:       Homicidal Ideations or Attempts:Pt denies any past or current HI or weapons in the house.    Past Attempts:      Past and Current Ideation, plan, intent:      Access to Firearms and Weapons:     Risk Assessment:  A suicide and violence risk assessment was performed as part of this evaluation. There patient is deemed to be at chronic elevated risk for self-harm/suicide given the following factors: N/A. The patient is deemed to be at chronic elevated risk for violence given the following factors: N/A. These risk factors are mitigated by the following factors:lack of active SI/HI. There is no acute risk for suicide or violence at this time. The patient was educated about relevant modifiable risk factors including following recommendations for treatment of psychiatric illness and abstaining from substance abuse.   While future psychiatric events cannot be accurately predicted, the patient does not currently require acute inpatient psychiatric care and does not currently meet [facility] involuntary commitment criteria.      [place] Suicide Severity Rating Scale - Initial  1. Wish to be Dead: Yes  2. Suicidal Thoughts: No  6. Suicide Behavior Question: No  Risk of Suicide: Low Risk    PHQ-9 PHQ-9 TOTAL SCORE   10/03/2018 3   08/16/2014 2              C-SSRS completed during this visit has been documented in patient's flowsheet and does not indicate further assessment through SAFE-T protocol.         Substance Abuse History     Substance Denies First Use Last Use Route Current Freq. Current Amount Method of Acquiring Notes     Nicotine []  16 Today 2 Daily 1ppd Store    Alcohol []  15 Today 1 Daily Up to five mixed drinks per  day Store [employer] and [employer] or Vodka in the Morning   Cannabis []  16 One month ago 2 0 0 Friends    Amphetamines [x]           Crack/Cocaine [x]           Benzodiazepines []  40 Three months ago 1 0 0 Friends Xanax   Hallucinogens []        See below   Opioids [x]           Inhalants []  16         Other (specify) []             Code Route   1 Oral   2 Smoked   3 Inhalation   4 Injection   5 Other         Problems Caused By Use:  Relationships, finances and job issues.    Periods of Abstinence and How?:   None reported.     Symptoms of W/D:  None reported.    Other information: Patient reports that he tried [facility] one time in [place]. - XTC one time, LSD one time, got Benzos from friends and GF.  Uses Benzo's to calm down from a bad dream or anxiety.  Tried Cocaine one time thirty years ago.    Medical History:  Current Problems: Depressive D/O  History of Head Injury: yes - 08/07/18  How would you rate your physical pain right now? 0  Has patient fallen in the past 6 months? Yes  Did patient require medical treatment for a fall in the past 6 months yes  Does patient feel unsteady? No  Patient Active Problem List   Diagnosis   ??? Depressive disorder, not elsewhere classified   ??? Anxiety state   ??? Essential hypertension   ??? Insomnia   ??? Psoriasis   ??? Alcohol abuse, episodic   ??? Psoriatic arthritis (CMS-HCC)   ??? Positive colorectal cancer screening using DNA-based stool test   ??? Diarrhea of presumed infectious origin         NUTRITIONAL SCREENING:    1.  What is your normal weight?     2.  Have you lost or gained weight recently? No.     3.  Was the weight loss/gain associated with alcohol or drug use? No.    4.  Was the weight loss/gain associated with a medical illness? No.        5.  How many meals a day do you eat? Two    Allergies:  Seasonal    Developmental History:  No known issues      Family History:    Relationship Status: Divorced- Patient feels that his drinking increased after the divorce and his biological children were taken away from me.  Children:Two   History of DSS/CPS: no   History of MI/SA: no       Social History:  Leisure Activities: Publishing copy and listening to music.  Social Supports: Girlfriend, parents and sister.  Support Groups: None reported.  Spiritual History: Raised in the [facility].  Cultural Considerations:     Trauma History:  History of Abuse: no   History of Neglect: no   History of Violence: no   History of Trauma: no   History of Exploitation: no     Legal History:  Current Charges: no   Past charges: yes - 30 years ago.    Educational History:  Highest Level of Education Completed: bachelor degree  History of Learning Disability  or Learning Difficulty: no      Employment History:  SSI/SSDI: no  Past Employment: Forensic psychologist: no   Veteran: no     Residential History:  Lives with significant other.  Homelessness: no   Eviction: no       PRELIMINARY GOALS FOR TREATMENT (Formal individualized treatment plan will be collaboratively created in first follow-up session):     Needs:SUD Treatment    Strengths:Verbal    Preferences:None noted.    Diagnostic Criteria Patient Meets for Primary Substance Use Disorder  Substance is often taken in larger amounts and/or over a longer period than the patient intended., Recurrent substance use resulting in a failure to fulfill major role obligations at work, school, or home., Continued substance use despite having persistent or recurrent social or interpersonal problems caused or exacerbated by the effects of the substance., Important social, occupational or recreational activities given up or reduced because of substance use., Craving or strong desire or urge to use the substance., Persistent attempts or one or more unsuccessful efforts made to cut down or control substance use. , A great deal of time is spent in activities necessary to obtain the substance, use the substance, or recover from effects.  and Recurrent substance use in situations in which it is physically hazardous.    Diagnoses:   1. Alcoholism (CMS-HCC)    Readiness for Change: Planning    ASAM:  (0=no problem or stable / 1=mild / 2=moderate / 3=substantial / 4= severe)  Dimension I (Acute Intoxication and/or Withdrawal): 1  Dimension II (Biomedical Conditions and Complications): 2  Dimension Rush (Emotional, Behavioral, or Cognitive Conditions and Complications): 2  Dimension  IV (Readiness to Change): 2  Dimension V (Relapse, Continued Use, or Continued Problem Potential): 3  Dimension VI (Recovery/Living Environment): 1      PLAN AND CLINICAL RECOMMENDATIONS    Based on this assessment, the patient would benefit from engagement with and enrollment in the [facility] ASAP Program    Outpatient Psychotherapy Visits - Required Documentation for LME (Medicaid) Payers      Treatment: Voluntary    When did patient start in therapy: 10/03/18    What are the current presenting symptoms & their frequency? Daily binge drinking of up to five mixed drinks per day.    Comments on Progress towards goals, and for areas where progress is not occurring, what changes in strategies/interventions are being made.  Please also comment on alternative services being discussed as appropriate:Initial Evaluation    Additional comments on progress towards goals: NA    What Treatment modalities are being used in therapy?  Please be specific about any evidence based practices being implemented: CBT, DBT and Seeking Safety.    What is the measurable realistic criteria for discharge and what is the anticipated discharge date: Within six months the pt will have established a significant reduction in his alcohol use with the goal of total abstinence.    Additional comments on measurable realistic criteria for discharge: None.    Reasons for admission, presenting problem (signs and symptoms including severity/frequency), goals of treatment, any cultural consideration, any relevant treatment history, barriers to treatment, why less restrictive service would not be more appropriate: Daily binge drinking.    LOCUS (Rate 1 through 5 as 5 being the highest)  Risk of Harm 3  Functional Status 2  Co-Morbidity 3  Recovery Environment - Stress 4  Recovery Environment - Support 4  Treatment and Recovery History 3  Engagement 4  .

## 2018-10-03 NOTE — Unmapped (Signed)
[name] reports doing well back on his Humira. He had no questions/concerns, and feels comfortable since he's used the medicine before.     [facility] Clinical Assessment & Refill Coordination Note    [name], DOB: 05-17-1965  Phone: [phone] (home)     All above HIPAA information was verified with patient.     Specialty Medication(s):   Inflammatory Disorders: Humira     Current Outpatient Medications   Medication Sig Dispense Refill   ??? ADALIMUMAB SYRINGE CITRATE FREE 40 MG/0.4 ML Inject the contents of 2 syringes (80 mg) under the skin on day 1. then inject the contents of 1 syringe (40 mg) every 14 days, beginning on day 8. 4 each 11   ??? empty container Misc Use as directed to dispose of Humira syringes. 1 each 2   ??? fluticasone propionate (FLONASE) 50 mcg/actuation nasal spray 2 sprays by Each Nare route daily. 16 g 12   ??? halobetasol (ULTRAVATE) 0.05 % ointment Apply topically Two (2) times a day. To affected areas as needed. 60 g 10   ??? omeprazole (PRILOSEC) 20 MG capsule Take 1 capsule (20 mg total) by mouth daily. 30 capsule 12   ??? sildenafiL, pulm.hypertension, (REVATIO) 20 mg tablet 2-5 tabs prior to intercourse as needed 30 tablet 5   ??? vancomycin (VANCOCIN) 125 MG capsule Take 1 capsule (125 mg total) by mouth Every six (6) hours. 40 capsule 0     No current facility-administered medications for this visit.         Changes to medications: [name] reports no changes at this time.    Allergies   Allergen Reactions   ??? Hydroxyzine      Sedation.  Ineffective for reducing anxiety.       Changes to allergies: No    SPECIALTY MEDICATION ADHERENCE     Humira - 0 left     Specialty medication(s) dose(s) confirmed: Regimen is correct and unchanged.     Are there any concerns with adherence? No    Adherence counseling provided? Not needed    CLINICAL MANAGEMENT AND INTERVENTION      Clinical Benefit Assessment:    Do you feel the medicine is effective or helping your condition? Yes    Clinical Benefit counseling provided? Not needed    Adverse Effects Assessment:    Are you experiencing any side effects? No    Are you experiencing difficulty administering your medicine? No    Quality of Life Assessment:    How many days over the past month did your psoriasis  keep you from your normal activities? For example, brushing your teeth or getting up in the morning. 0    Have you discussed this with your provider? Not needed    Therapy Appropriateness:    Is therapy appropriate? Yes, therapy is appropriate and should be continued    DISEASE/MEDICATION-SPECIFIC INFORMATION      For patients on injectable medications: Patient currently has 0 doses left.  Next injection is scheduled for 9/24.    PATIENT SPECIFIC NEEDS     ? Does the patient have any physical, cognitive, or cultural barriers? No    ? Is the patient high risk? No     ? Does the patient require a Care Management Plan? No     ? Does the patient require physician intervention or other additional services (i.e. nutrition, smoking cessation, social work)? No      SHIPPING  Specialty Medication(s) to be Shipped:   Inflammatory Disorders: Humira    Other medication(s) to be shipped: na     Changes to insurance: No    Delivery Scheduled: Yes, Expected medication delivery date: Friday, Sept 18.     Medication will be delivered via Same Day Courier to the confirmed home address in [facility].    The patient will receive a drug information handout for each medication shipped and additional FDA Medication Guides as required.  Verified that patient has previously received a Conservation officer, historic buildings.    All of the patient's questions and concerns have been addressed.    Lanney Gins   [facility] Specialty Pharmacist

## 2018-10-07 MED FILL — HUMIRA SYRINGE CITRATE FREE 40 MG/0.4 ML: 28 days supply | Qty: 2 | Fill #1

## 2018-10-07 MED FILL — HUMIRA SYRINGE CITRATE FREE 40 MG/0.4 ML: 28 days supply | Qty: 2 | Fill #1 | Status: AC

## 2018-10-25 ENCOUNTER — Emergency Department: Admit: 2018-10-25 | Discharge: 2018-10-25 | Disposition: A | Discharge status: Home / Self Care | Payer: MEDICAID

## 2018-10-25 ENCOUNTER — Ambulatory Visit: Admit: 2018-10-25 | Discharge: 2018-10-25 | Disposition: A | Discharge status: Home / Self Care | Payer: MEDICAID

## 2018-10-25 DIAGNOSIS — I10 Essential (primary) hypertension: Secondary | ICD-10-CM

## 2018-10-25 DIAGNOSIS — F17210 Nicotine dependence, cigarettes, uncomplicated: Secondary | ICD-10-CM

## 2018-10-25 DIAGNOSIS — F10230 Alcohol dependence with withdrawal, uncomplicated: Secondary | ICD-10-CM

## 2018-10-25 DIAGNOSIS — K219 Gastro-esophageal reflux disease without esophagitis: Secondary | ICD-10-CM

## 2018-10-25 DIAGNOSIS — Z20828 Contact with and (suspected) exposure to other viral communicable diseases: Secondary | ICD-10-CM

## 2018-10-25 DIAGNOSIS — Z818 Family history of other mental and behavioral disorders: Secondary | ICD-10-CM

## 2018-10-25 DIAGNOSIS — F329 Major depressive disorder, single episode, unspecified: Secondary | ICD-10-CM

## 2018-10-25 DIAGNOSIS — F419 Anxiety disorder, unspecified: Secondary | ICD-10-CM

## 2018-10-25 DIAGNOSIS — L409 Psoriasis, unspecified: Secondary | ICD-10-CM

## 2018-10-25 DIAGNOSIS — Z811 Family history of alcohol abuse and dependence: Secondary | ICD-10-CM

## 2018-10-25 DIAGNOSIS — R42 Dizziness and giddiness: Secondary | ICD-10-CM

## 2018-10-25 LAB — BLOOD GAS, VENOUS
BASE EXCESS VENOUS: 3.5 — ABNORMAL HIGH (ref -2.0–2.0)
HCO3 VENOUS: 27 mmol/L (ref 22–27)
O2 SATURATION VENOUS: 69.7 % (ref 40.0–85.0)
PCO2 VENOUS: 38 mmHg — ABNORMAL LOW (ref 40–60)
PH VENOUS: 7.47 — ABNORMAL HIGH (ref 7.32–7.43)

## 2018-10-25 LAB — COMPREHENSIVE METABOLIC PANEL
ALBUMIN: 4.4 g/dL (ref 3.5–5.0)
ALT (SGPT): 42 U/L (ref <50)
ANION GAP: 18 mmol/L — ABNORMAL HIGH (ref 7–15)
AST (SGOT): 68 U/L — ABNORMAL HIGH (ref 19–55)
BILIRUBIN TOTAL: 1.4 mg/dL — ABNORMAL HIGH (ref 0.0–1.2)
BLOOD UREA NITROGEN: 7 mg/dL (ref 7–21)
BUN / CREAT RATIO: 8
CALCIUM: 9.3 mg/dL (ref 8.5–10.2)
CHLORIDE: 96 mmol/L — ABNORMAL LOW (ref 98–107)
CO2: 23 mmol/L (ref 22.0–30.0)
CREATININE: 0.93 mg/dL (ref 0.70–1.30)
EGFR CKD-EPI NON-AA MALE: >90 mL/min/{1.73_m2} (ref >=60)
GLUCOSE RANDOM: 120 mg/dL (ref 70–179)
POTASSIUM: 3.3 mmol/L — ABNORMAL LOW (ref 3.5–5.0)
PROTEIN TOTAL: 8.3 g/dL (ref 6.5–8.3)
SODIUM: 137 mmol/L (ref 135–145)

## 2018-10-25 LAB — CBC W/ AUTO DIFF
BASOPHILS ABSOLUTE COUNT: 0 10*9/L (ref 0.0–0.1)
BASOPHILS RELATIVE PERCENT: 0.4 %
EOSINOPHILS ABSOLUTE COUNT: 0.2 10*9/L (ref 0.0–0.4)
EOSINOPHILS RELATIVE PERCENT: 2.8 %
HEMATOCRIT: 46.3 % (ref 41.0–53.0)
LARGE UNSTAINED CELLS: 2 % (ref 0–4)
LYMPHOCYTES ABSOLUTE COUNT: 1.1 10*9/L — ABNORMAL LOW (ref 1.5–5.0)
LYMPHOCYTES RELATIVE PERCENT: 18.2 %
MEAN CORPUSCULAR HEMOGLOBIN CONC: 33 g/dL (ref 31.0–37.0)
MEAN CORPUSCULAR HEMOGLOBIN: 35.7 pg — ABNORMAL HIGH (ref 26.0–34.0)
MEAN PLATELET VOLUME: 8.6 fL (ref 7.0–10.0)
MONOCYTES ABSOLUTE COUNT: 0.3 10*9/L (ref 0.2–0.8)
MONOCYTES RELATIVE PERCENT: 5.4 %
NEUTROPHILS ABSOLUTE COUNT: 4.3 10*9/L (ref 2.0–7.5)
NEUTROPHILS RELATIVE PERCENT: 71.7 %
PLATELET COUNT: 149 10*9/L — ABNORMAL LOW (ref 150–440)
RED CELL DISTRIBUTION WIDTH: 14.1 % (ref 12.0–15.0)
WBC ADJUSTED: 5.9 10*9/L (ref 4.5–11.0)

## 2018-10-25 LAB — BETA-HYDROXYBUTYRATE: Lab: 0.62 — ABNORMAL HIGH

## 2018-10-25 LAB — ETHANOL: Ethanol:MCnc:Pt:Ser/Plas:Qn:GC: 29 — ABNORMAL HIGH

## 2018-10-25 LAB — T3 FREE: Triiodothyronine.free:MCnc:Pt:Ser/Plas:Qn:: 5.43

## 2018-10-25 LAB — TROPONIN I
Troponin I.cardiac:MCnc:Pt:Ser/Plas:Qn:: <0.06
Troponin I.cardiac:MCnc:Pt:Ser/Plas:Qn:: <0.06

## 2018-10-25 LAB — PRO-BNP: Natriuretic peptide.B prohormone N-Terminal:MCnc:Pt:Ser/Plas:Qn:: 751 — ABNORMAL HIGH

## 2018-10-25 LAB — SMEAR REVIEW

## 2018-10-25 LAB — THYROID STIMULATING HORMONE: Thyrotropin:ACnc:Pt:Ser/Plas:Qn:: 0.667

## 2018-10-25 LAB — GLUCOSE RANDOM: Glucose:MCnc:Pt:Ser/Plas:Qn:: 120

## 2018-10-25 LAB — RED BLOOD CELL COUNT: Lab: 4.27 — ABNORMAL LOW

## 2018-10-25 LAB — O2 SATURATION VENOUS: Oxygen saturation:MFr:Pt:BldV:Qn:: 69.7

## 2018-10-25 LAB — FREE T4: Thyroxine.free:MCnc:Pt:Ser/Plas:Qn:: 1.47 — ABNORMAL HIGH

## 2018-10-25 MED ORDER — POTASSIUM CHLORIDE ER 20 MEQ TABLET,EXTENDED RELEASE(PART/CRYST)
ORAL_TABLET | Freq: Every day | ORAL | 0 refills | 10 days | Status: CP
Start: 2018-10-25 — End: 2018-11-04

## 2018-10-25 NOTE — Unmapped (Signed)
Emergency Department Provider Note  [facility]      MDM & Course   ??Nicholas Rush is a 53 y.o. male with past medical history of esophageal reflux, HTN, anxiety, depression and tobacco use  presenting for several months of dizziness upon standing. On exam, patient is slightly anxious appearing but in no acute distress. S1S2 tachycardic. 2+ symmetric radial and DP pulses. Abdomen soft, nondistended, nontender. Normal Respiratory exam. Mildly dry MM. No focal neural deficits. Tongue fasciculations when extended.     High suspicion for dehydration vs alcohol withdrawal in the setting of recurrent orthostasis linked to alcohol abuse. To be conservative, will obtain CT head since patient reports recurrent falls although he denies head trauma since last workup. Plan for CBC, CMP, Troponin, TSH, HIV, CXR and EKG.     3:02 PM   COVID negative. First troponin negative. EtOH elevated to 29. CMP with hypokalemia of 3.3, elevated bilirubin and elevated AST. CXR negative. CT Head negative.     Additional MDM   I independently visualized the EKG tracing.   I independently visualized the radiology images.   I reviewed the patient's prior medical records.     Final diagnoses:   Dizziness on standing (Primary)   Alcohol withdrawal syndrome without complication (CMS-HCC)     History & ROS     Nicholas Rush is a 53 y.o. male with past medical history of esophageal reflux, HTN, anxiety, depression and tobacco use  presenting for several months of dizziness upon standing. Patient reports sometimes when he stands he becomes dizzy and falls, stating he just all of a sudden feels like is going to fall. Patient has history of similar episodes in the past. He reports history of syncope with his episodes of dizziness, last episode within this year. He also endorses mild cold sweats at night. Over the past week, patient notes decreased appetite, but has been drinking normally. He has been taking over the counter allergy medicine and is on Humara once every 14 days for psoriatic arthritis but otherwise denies daily medications. Of note, patient's wife is currently being seen in the ED for shortness of breath and cough, although he denies any infectious symptoms. Denies known COVID contacts.  Additionally, he states he contracted C.Diff from his wife but was treated and since reports 1-2 BM per day. He endorses EtOH use, drinking about 1/5 of alcohol per week, stating he only had about 2 drinks yesterday and one shot this morning. No history of IV drug use. No cardiac history or history of stress test. Denies fevers, chills, chest pain, abdominal pain, bilateral calf pain, feet pain, or known toe infections.     Per review, patient was seen on 7/19 for fall from standing. CT Head and Cervical Spine obtained was negative and patient was discharged when clinically sober. No known falls with head trauma since this encounter. Furthermore, he was seen by Internal Medicine on 9/8 for workup of his dizziness when standing. Per chart, patient has history of orthostasis linked to alcohol use. Additionally, his hypertension has improved and he is no longer taking his hypertensive medications.     CONST: See above  EYES: Negative for eye redness.  ENT: Negative for sore throat.  CARD: Negative for chest pain.  RESP: Negative for shortness of breath.  GI: Negative for abdominal pain, vomiting or diarrhea.  GU: Negative for dysuria.  MSK: Negative for extremity pain  SKIN: Negative for rash.  NEURO: Positive for dizziness  upon standing. Negative for headaches or numbness.    Physical Exam   BP 155/94  - Pulse 92  - Temp 36.4 ??C (97.5 ??F) (Oral)  - Resp 19  - SpO2 99%   CONST:  Alert and oriented. Slightly anxious appearing, but in no distress.  EYES: Normal conjunctiva, PERRL  ENT: Mucous membranes are mildly dry. Clear pharynx.  HEME/LYMPH: No pallor  CV: S1S2 tachycardic. 2+ symmetric radial and DP pulses.  RESP:  Normal respiratory effort. CTABL.  GI: Soft, non-tender, non-distended.   MSK: Nl ROM, no tenderness. No B/L LE erythema or edema.  NEURO: Normal speech and language. No gross focal neurologic deficits are appreciated. Slightly anxious.Tongue fasciculations when extended.   SKIN: Skin is warm, dry and intact. No rash noted.    Additional History     Past Medical History:   Diagnosis Date   ??? Anxiety    ??? Depressive disorder    ??? Erectile dysfunction    ??? Esophageal reflux    ??? History of pyloric stenosis    ??? Hypertension    ??? Insomnia    ??? Psoriasis     Dermatologist - Dr. Deloria Lair in [facility]   ??? Tobacco use     1ppd     Past Surgical History:   Procedure Laterality Date   ??? PYLOROMYOTOMY     ??? WISDOM TOOTH EXTRACTION       Family History   Problem Relation Age of Onset   ??? Bipolar disorder Mother    ??? Depression Sister    ??? Alcohol abuse Sister    ??? Alcohol abuse Maternal Aunt    ??? Melanoma Neg Hx    ??? Basal cell carcinoma Neg Hx    ??? Squamous cell carcinoma Neg Hx      Allergies  Hydroxyzine  Social History  Social History     Tobacco Use   ??? Smoking status: Current Every Day Smoker     Packs/day: 1.00     Years: 30.00     Pack years: 30.00     Types: Cigarettes   ??? Smokeless tobacco: Never Used   Substance Use Topics   ??? Alcohol use: Yes     Alcohol/week: 42.0 standard drinks     Types: 42 Cans of beer per week     Comment: 6 pack every day   ??? Drug use: No     Attestation     Documentation assistance was provided by Adam Phenix, Scribe, on October 25, 2018 at 12:52 PM for Sherlene Shams, MD.    Documentation assistance was provided by the scribe in my presence.  The documentation recorded by the scribe has been reviewed by me and accurately reflects the services I personally performed.     Laurance Flatten, MD  10/27/18 [phone]

## 2018-10-25 NOTE — Unmapped (Signed)
Pt. Via wheelchair to triage with c/o dizziness x a few months that occurs when he stands; was a visitor of another pt. And stood and felt dizzy, so decided to check in

## 2018-10-26 LAB — HIV ANTIGEN/ANTIBODY COMBO: HIV 1+2 Ab+HIV1 p24 Ag:PrThr:Pt:Ser/Plas:Ord:IA: NONREACTIVE

## 2018-10-26 LAB — SYPHILIS RPR SCREEN: Reagin Ab:PrThr:Pt:Ser:Ord:RPR: NONREACTIVE

## 2018-10-26 NOTE — Unmapped (Signed)
T4, free  Order: [id number]  Status:  Final result ????    Beta Hydroxybutyrate  Order: [id number]  Status:  Final result ????    Sent to provider for review and action

## 2018-10-26 NOTE — Unmapped (Signed)
Beta Hydroxybutyrate  Order: [id number]  Status:  Final result ????    Sent to provider for review and action

## 2018-11-08 NOTE — Unmapped (Signed)
The [facility] has made a third and final attempt to reach this patient to refill the following medication:Humira.      We have left voicemails on the following phone numbers: [phone] and [phone].    Dates contacted: 10/8 10/13 10/20  Last scheduled delivery: 9/18    The patient may be at risk of non-compliance with this medication. The patient should call the [facility] at [phone] (option 4) to refill medication.    Nicholas Rush D Administrator [facility] Specialty Technician

## 2018-11-10 NOTE — Unmapped (Signed)
[facility] Refill Coordination Note    Specialty Medication(s) to be Shipped:   Inflammatory Disorders: Humira    Other medication(s) to be shipped: na     Nicholas Rush, DOB: 1965-04-01  Phone: [phone] (home)       All above HIPAA information was verified with patient.     Completed refill call assessment today to schedule patient's medication shipment from the [facility] [phone]).       Specialty medication(s) and dose(s) confirmed: Regimen is correct and unchanged.   Changes to medications: Nicholas Rush reports no changes at this time.  Changes to insurance: No  Questions for the pharmacist: No    Confirmed patient received Welcome Packet with first shipment. The patient will receive a drug information handout for each medication shipped and additional FDA Medication Guides as required.       DISEASE/MEDICATION-SPECIFIC INFORMATION        For patients on injectable medications: Patient currently has 0 doses left.  Next injection is scheduled for 102920.    SPECIALTY MEDICATION ADHERENCE     Medication Adherence    Patient reported X missed doses in the last month: 0  Specialty Medication: Humira CF 40 mg/0.4 ml  Patient is on additional specialty medications: No  Any gaps in refill history greater than 2 weeks in the last 3 months: no  Demonstrates understanding of importance of adherence: yes  Informant: patient  Reliability of informant: reliable  Confirmed plan for next specialty medication refill: delivery by pharmacy  Refills needed for supportive medications: not needed                Humira CF 40 mg/0.4 ml. 0 on hand      SHIPPING     Shipping address confirmed in Epic.     Delivery Scheduled: Yes, Expected medication delivery date: 102620.     Medication will be delivered via Same Day Courier to the home address in Epic [place].    Nicholas Rush   [facility] Specialty Technician

## 2018-11-14 MED FILL — HUMIRA SYRINGE CITRATE FREE 40 MG/0.4 ML: 28 days supply | Qty: 2 | Fill #2

## 2018-11-14 MED FILL — HUMIRA SYRINGE CITRATE FREE 40 MG/0.4 ML: 28 days supply | Qty: 2 | Fill #2 | Status: AC

## 2018-12-12 NOTE — Unmapped (Signed)
[facility] Refill Coordination Note    Specialty Medication(s) to be Shipped:   Inflammatory Disorders: Humira    Other medication(s) to be shipped: none     Nicholas Rush, DOB: [birth year]  Phone: [phone] (home)       All above HIPAA information was verified with patient.     Completed refill call assessment today to schedule patient's medication shipment from the [facility] [phone]).       Specialty medication(s) and dose(s) confirmed: Regimen is correct and unchanged.   Changes to medications: Nicholas Rush reports no changes at this time.  Changes to insurance: No  Questions for the pharmacist: No    Confirmed patient received Welcome Packet with first shipment. The patient will receive a drug information handout for each medication shipped and additional FDA Medication Guides as required.       DISEASE/MEDICATION-SPECIFIC INFORMATION        For patients on injectable medications: Patient currently has 0 doses left.  Next injection is scheduled for 12/16/18.    SPECIALTY MEDICATION ADHERENCE     Medication Adherence    Patient reported X missed doses in the last month: 0  Specialty Medication: Humira CF 40 mg/0.4 ml  Patient is on additional specialty medications: No                Humira cf 40/0.4 mg/ml: 0 days of medicine on hand          SHIPPING     Shipping address confirmed in Epic.     Delivery Scheduled: Yes, Expected medication delivery date: 12/13/18.     Medication will be delivered via Same Day Courier to the prescription address in Epic WAM.    Unk Lightning   [facility] Specialty Technician

## 2018-12-13 MED FILL — HUMIRA SYRINGE CITRATE FREE 40 MG/0.4 ML: 28 days supply | Qty: 2 | Fill #3

## 2018-12-13 MED FILL — HUMIRA SYRINGE CITRATE FREE 40 MG/0.4 ML: 28 days supply | Qty: 2 | Fill #3 | Status: AC

## 2019-01-11 MED FILL — HUMIRA SYRINGE CITRATE FREE 40 MG/0.4 ML: 28 days supply | Qty: 2 | Fill #4 | Status: AC

## 2019-01-11 MED FILL — HUMIRA SYRINGE CITRATE FREE 40 MG/0.4 ML: 28 days supply | Qty: 2 | Fill #4

## 2019-01-11 NOTE — Unmapped (Signed)
[facility] Refill Coordination Note    Specialty Medication(s) to be Shipped:   Inflammatory Disorders: Humira    Other medication(s) to be shipped: n/a     Nicholas Rush III, DOB: September 25, 1965  Phone: [phone] (home)       All above HIPAA information was verified with patient.     Was a Nurse, learning disability used for this call? No    Completed refill call assessment today to schedule patient's medication shipment from the [facility] [phone]).       Specialty medication(s) and dose(s) confirmed: Regimen is correct and unchanged.   Changes to medications: [name] reports no changes at this time.  Changes to insurance: No  Questions for the pharmacist: No    Confirmed patient received Welcome Packet with first shipment. The patient will receive a drug information handout for each medication shipped and additional FDA Medication Guides as required.       DISEASE/MEDICATION-SPECIFIC INFORMATION        For patients on injectable medications: Patient currently has 0 doses left.  Next injection is scheduled for 01/13/19.    SPECIALTY MEDICATION ADHERENCE     Medication Adherence    Patient reported X missed doses in the last month: 0  Specialty Medication: Humira CF 40 mg/0.4 ml   Patient is on additional specialty medications: No  Informant: patient                Humira 40mg /0.47mL: 0 days of medicine on hand         SHIPPING     Shipping address confirmed in Epic.     Delivery Scheduled: Yes, Expected medication delivery date: 01/12/19.     Medication will be delivered via UPS to the prescription address in Epic [place].    Wyatt Mage M Elisabeth Cara   [facility] Specialty Technician

## 2019-02-17 NOTE — Unmapped (Signed)
[facility] Refill Coordination Note    Specialty Medication(s) to be Shipped:   Inflammatory Disorders: Humira    Other medication(s) to be shipped: n/a     Nicholas Rush III, DOB: [birth year]  Phone: [phone] (home)       All above HIPAA information was verified with patient.     Was a Nurse, learning disability used for this call? No    Completed refill call assessment today to schedule patient's medication shipment from the [facility] [phone]).       Specialty medication(s) and dose(s) confirmed: Regimen is correct and unchanged.   Changes to medications: Nicholas Rush reports no changes at this time.  Changes to insurance: No  Questions for the pharmacist: No    Confirmed patient received Welcome Packet with first shipment. The patient will receive a drug information handout for each medication shipped and additional FDA Medication Guides as required.       DISEASE/MEDICATION-SPECIFIC INFORMATION        For patients on injectable medications: Patient currently has 0 doses left.  Next injection is scheduled for 02/24/2019.    SPECIALTY MEDICATION ADHERENCE     Medication Adherence    Patient reported X missed doses in the last month: 0  Specialty Medication: Humira CF 40 mg/0.4 ml  Patient is on additional specialty medications: No  Any gaps in refill history greater than 2 weeks in the last 3 months: no  Demonstrates understanding of importance of adherence: yes  Informant: patient  Reliability of informant: reliable  Confirmed plan for next specialty medication refill: delivery by pharmacy  Refills needed for supportive medications: not needed                Humira CF 40 mg/0.4 ml. 0 on hand      SHIPPING     Shipping address confirmed in Epic.     Delivery Scheduled: Yes, Expected medication delivery date: 02/20/2019.     Medication will be delivered via Same Day Courier to the prescription address in Epic WAM.    Nicholas Rush Nicholas Rush   [facility] Shared [facility] Specialty Technician

## 2019-02-17 NOTE — Unmapped (Signed)
Patient requesting refill of escitalopram. Patient wants to schedule a phone visit.

## 2019-02-19 LAB — SMEAR REVIEW

## 2019-02-19 LAB — CBC W/ AUTO DIFF
BASOPHILS ABSOLUTE COUNT: 0 10*9/L (ref 0.0–0.1)
BASOPHILS RELATIVE PERCENT: 0.7 %
EOSINOPHILS ABSOLUTE COUNT: 0.1 10*9/L (ref 0.0–0.4)
EOSINOPHILS RELATIVE PERCENT: 2.2 %
HEMOGLOBIN: 12.9 g/dL — ABNORMAL LOW (ref 13.5–17.5)
LARGE UNSTAINED CELLS: 2 % (ref 0–4)
LYMPHOCYTES ABSOLUTE COUNT: 0.8 10*9/L — ABNORMAL LOW (ref 1.5–5.0)
LYMPHOCYTES RELATIVE PERCENT: 20.4 %
MEAN CORPUSCULAR HEMOGLOBIN CONC: 33.4 g/dL (ref 31.0–37.0)
MEAN CORPUSCULAR HEMOGLOBIN: 37.7 pg — ABNORMAL HIGH (ref 26.0–34.0)
MEAN CORPUSCULAR VOLUME: 112.8 fL — ABNORMAL HIGH (ref 80.0–100.0)
MEAN PLATELET VOLUME: 9.7 fL (ref 7.0–10.0)
MONOCYTES ABSOLUTE COUNT: 0.2 10*9/L (ref 0.2–0.8)
MONOCYTES RELATIVE PERCENT: 5.3 %
NEUTROPHILS RELATIVE PERCENT: 69.7 %
PLATELET COUNT: 84 10*9/L — ABNORMAL LOW (ref 150–440)
RED BLOOD CELL COUNT: 3.44 10*12/L — ABNORMAL LOW (ref 4.50–5.90)
RED CELL DISTRIBUTION WIDTH: 14.7 % (ref 12.0–15.0)
WBC ADJUSTED: 3.9 10*9/L — ABNORMAL LOW (ref 4.5–11.0)

## 2019-02-19 LAB — URINALYSIS WITH CULTURE REFLEX
BACTERIA: NONE SEEN /HPF
BILIRUBIN UA: NEGATIVE
BLOOD UA: NEGATIVE
GLUCOSE UA: NEGATIVE
HYALINE CASTS: 1 /LPF (ref 0–1)
KETONES UA: 20 — AB
LEUKOCYTE ESTERASE UA: NEGATIVE
NITRITE UA: NEGATIVE
PH UA: 6 (ref 5.0–9.0)
RBC UA: 1 /HPF (ref <=3)
SPECIFIC GRAVITY UA: 1.015 (ref 1.003–1.030)
SQUAMOUS EPITHELIAL: <1 /HPF (ref 0–5)
UROBILINOGEN UA: 4 — AB

## 2019-02-19 LAB — COMPREHENSIVE METABOLIC PANEL
ALBUMIN: 3.2 g/dL — ABNORMAL LOW (ref 3.5–5.0)
ALKALINE PHOSPHATASE: 141 U/L — ABNORMAL HIGH (ref 38–126)
ALT (SGPT): 79 U/L — ABNORMAL HIGH (ref <50)
ANION GAP: 9 mmol/L (ref 7–15)
BILIRUBIN TOTAL: 1.1 mg/dL (ref 0.0–1.2)
BLOOD UREA NITROGEN: 4 mg/dL — ABNORMAL LOW (ref 7–21)
BUN / CREAT RATIO: 6
CALCIUM: 7.6 mg/dL — ABNORMAL LOW (ref 8.5–10.2)
CHLORIDE: 98 mmol/L (ref 98–107)
CO2: 21 mmol/L — ABNORMAL LOW (ref 22.0–30.0)
CREATININE: 0.67 mg/dL — ABNORMAL LOW (ref 0.70–1.30)
EGFR CKD-EPI AA MALE: >90 mL/min/{1.73_m2} (ref >=60)
EGFR CKD-EPI NON-AA MALE: >90 mL/min/{1.73_m2} (ref >=60)
GLUCOSE RANDOM: 87 mg/dL (ref 70–179)
POTASSIUM: 3.2 mmol/L — ABNORMAL LOW (ref 3.5–5.0)
PROTEIN TOTAL: 6.9 g/dL (ref 6.5–8.3)
SODIUM: 128 mmol/L — ABNORMAL LOW (ref 135–145)

## 2019-02-19 LAB — PROTEIN TOTAL: Protein:MCnc:Pt:Ser/Plas:Qn:: 6.9

## 2019-02-19 LAB — ETHANOL
Ethanol:MCnc:Pt:Ser/Plas:Qn:GC: 111
Ethanol:MCnc:Pt:Ser/Plas:Qn:GC: 55

## 2019-02-19 LAB — LACTATE BLOOD VENOUS
Lactate:SCnc:Pt:BldV:Qn:: 4 — ABNORMAL HIGH
Lactate:SCnc:Pt:BldV:Qn:: 7.1

## 2019-02-19 LAB — UROBILINOGEN UA: Lab: 4 — AB

## 2019-02-19 LAB — LYMPHOCYTES RELATIVE PERCENT: Lymphocytes/100 leukocytes:NFr:Pt:Bld:Qn:Automated count: 20.4

## 2019-02-20 ENCOUNTER — Ambulatory Visit: Admit: 2019-02-20 | Discharge: 2019-02-28 | Disposition: A | Discharge status: Home / Self Care | Payer: MEDICAID

## 2019-02-20 DIAGNOSIS — R296 Repeated falls: Secondary | ICD-10-CM | POA: Insufficient documentation

## 2019-02-20 LAB — COMPREHENSIVE METABOLIC PANEL
ALBUMIN: 3.3 g/dL — ABNORMAL LOW (ref 3.5–5.0)
ALKALINE PHOSPHATASE: 153 U/L — ABNORMAL HIGH (ref 38–126)
ALT (SGPT): 78 U/L — ABNORMAL HIGH (ref <50)
ANION GAP: 5 mmol/L — ABNORMAL LOW (ref 7–15)
AST (SGOT): 247 U/L — ABNORMAL HIGH (ref 19–55)
BILIRUBIN TOTAL: 1.9 mg/dL — ABNORMAL HIGH (ref 0.0–1.2)
BLOOD UREA NITROGEN: 5 mg/dL — ABNORMAL LOW (ref 7–21)
BUN / CREAT RATIO: 8
CALCIUM: 7.6 mg/dL — ABNORMAL LOW (ref 8.5–10.2)
CHLORIDE: 99 mmol/L (ref 98–107)
CO2: 26 mmol/L (ref 22.0–30.0)
CREATININE: 0.62 mg/dL — ABNORMAL LOW (ref 0.70–1.30)
EGFR CKD-EPI NON-AA MALE: >90 mL/min/{1.73_m2} (ref >=60)
GLUCOSE RANDOM: 117 mg/dL (ref 70–179)
POTASSIUM: 3.3 mmol/L — ABNORMAL LOW (ref 3.5–5.0)
PROTEIN TOTAL: 7 g/dL (ref 6.5–8.3)
SODIUM: 130 mmol/L — ABNORMAL LOW (ref 135–145)

## 2019-02-20 LAB — MAGNESIUM
Magnesium:MCnc:Pt:Ser/Plas:Qn:: 0.9 — CL
Magnesium:MCnc:Pt:Ser/Plas:Qn:: 1.4 — ABNORMAL LOW

## 2019-02-20 LAB — CBC
HEMATOCRIT: 37.1 % — ABNORMAL LOW (ref 41.0–53.0)
MEAN CORPUSCULAR HEMOGLOBIN CONC: 34.5 g/dL (ref 31.0–37.0)
MEAN CORPUSCULAR HEMOGLOBIN: 38.6 pg — ABNORMAL HIGH (ref 26.0–34.0)
MEAN CORPUSCULAR VOLUME: 111.7 fL — ABNORMAL HIGH (ref 80.0–100.0)
MEAN PLATELET VOLUME: 10.4 fL — ABNORMAL HIGH (ref 7.0–10.0)
RED BLOOD CELL COUNT: 3.32 10*12/L — ABNORMAL LOW (ref 4.50–5.90)
RED CELL DISTRIBUTION WIDTH: 14.4 % (ref 12.0–15.0)
WBC ADJUSTED: 4.6 10*9/L (ref 4.5–11.0)

## 2019-02-20 LAB — CO2: Carbon dioxide:SCnc:Pt:Ser/Plas:Qn:: 26

## 2019-02-20 LAB — HEMOGLOBIN: Hemoglobin:MCnc:Pt:Bld:Qn:: 12.8 — ABNORMAL LOW

## 2019-02-20 LAB — LACTATE BLOOD VENOUS: Lactate:SCnc:Pt:BldV:Qn:: 2.6 — ABNORMAL HIGH

## 2019-02-20 LAB — PROTIME: Coagulation tissue factor induced:Time:Pt:PPP:Qn:Coag: 12.2

## 2019-02-20 MED FILL — HUMIRA SYRINGE CITRATE FREE 40 MG/0.4 ML: 28 days supply | Qty: 2 | Fill #5

## 2019-02-20 MED FILL — HUMIRA SYRINGE CITRATE FREE 40 MG/0.4 ML: 28 days supply | Qty: 2 | Fill #5 | Status: AC

## 2019-02-20 NOTE — Unmapped (Signed)
Social Work  Psychosocial Assessment    Patient Name: Nicholas Rush   Medical Record Number: [id number]   Date of Birth: 1965-05-15  Sex: Male     Referral  Referred by: Physician, Care Manager  Reason for Referral: Substance Abuse Issues  No Psychosocial Interventions Necessary: (wife reports she feel the patient is depressed)     SW Assessment with (wife): Per the patient's RN Amy today, its not a  good day to do a PSA with patient as he is on schedule Librium and withdrawing on CIWA protocol.     Collateral Information from Wife: [name]  Patient and wife have been together for 5 years, they recently married 1 year ago. He lives with wife an 2 step children ages (31 & 43). He also has 2 children ages 33 & 53 and they don't see each other often or talk.    Wife reports he has not worked in the last 2 years. He has progressed over the last 2 years from a pint of liquor daily to a 5th daily. He drinks whiskey and bourbon. He is with a decreased appetite and sleeps 24 hours at a time. Wife feels he is also depressed.     ADL's: Wife reports a hx of falls for about 2 years now even when he is not drinking. He is not able to do his bathing, and stays on the couch, and now is requiring assistance to the bathroom for the past few months. Wife reports he had a seizure around 01/14/19, he is no able to toilet himself and at time has use the bathroom on himself.     Substance Abuse Tx History: [facility] in [place] 2 years ago   [facility] in [place] in August of 2020.     PCP: Dr. Blake Divine @ [facility].   Pharmacy: [facility] on [address]    Family: He has a sister who is a recovery Alcoholic who lives in [place] and his parents lives in [place] and mother is Bipolar per the wife and takes medications. 2 children (17 & 20- one goes to [employer] and one is local).     Contact:   Primary Emergency Contact: Bisbee,[name]  Address: [address] [place], [place] [address]   Home Phone: [phone]  Mobile Phone: [phone]  Relation: wife    Legal Next of Kin / Guardian / POA / Advance Directives  Patient has a wife  Bisbee,[name]  Home Phone: [phone]  Mobile Phone: [phone]    Advance Directive (Medical Treatment)  Does patient have an advance directive covering medical treatment?: Patient does not have advance directive covering medical treatment.    Health Care Decision Maker [HCDM] (Medical & Mental Health Treatment)  Healthcare Decision Maker: Patient does not wish to appoint a Health Care Decision Maker at this time  Information offered on HCDM, Medical & Mental Health advance directives:: Patient declined information.      Type of Residence   Mailing Address:  [address]  [place] [address]    Medical Information   Past Medical History:   Diagnosis Date   ??? Anxiety    ??? Depressive disorder    ??? Erectile dysfunction    ??? Esophageal reflux    ??? History of pyloric stenosis    ??? Hypertension    ??? Insomnia    ??? Psoriasis     Dermatologist - Dr. Deloria Lair in [place]   ???  Seizure (CMS-HCC) 02/20/2019   ??? Tobacco use     1ppd       Past Surgical History:   Procedure Laterality Date   ??? PYLOROMYOTOMY     ??? WISDOM TOOTH EXTRACTION         Family History   Problem Relation Age of Onset   ??? Bipolar disorder Mother    ??? Depression Sister    ??? Alcohol abuse Sister    ??? Alcohol abuse Maternal Aunt    ??? Melanoma Neg Hx    ??? Basal cell carcinoma Neg Hx    ??? Squamous cell carcinoma Neg Hx        Financial Information   Primary Insurance: Payor: MEDICAID [place] / Plan: MEDICAID [place] ACCESS / Product Type: *No Product type* /    Secondary Insurance: None   Prescription Coverage: Medicaid   Preferred Pharmacy: [facility] [place] [facility]  [facility] [phone] - [place], [place] - [address]    Barriers to taking medication: No    Transition Home   Transportation at time of discharge: Family/Friend's Private Vehicle    Anticipated changes related to Illness: inability to care for self   Services in place prior to admission: N/A   Services anticipated for DC: Substance Abuse Services: Inpatient SA or intense outpatient tx   Hemodialysis Prior to Admission: No    Readmission  Risk of Unplanned Readmission Score: UNPLANNED READMISSION SCORE: 14%  Readmitted Within the Last 30 Days?   Readmission Factors include: unable to assess    Social Determinants of Health  Social Determinants of Health were addressed in provider documentation.  Please refer to patient history.    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 and Psychiatric History  Psychosocial Stressors: Coping with health challenges/recent hospitalization, Concern for non-compliance      Psychological Issues/Information: Mental illness   Concerns: Family history of mental illness, Inpatient treatment for mental illness          Chemical Dependency: Alcohol              Outpatient Providers: Primary Care Provider      Legal: No legal issues      Ability to [employer]: Unfamiliar with options/procedures for obtaining    Covering SW   Solmon Ice, MSW  Social Work Futures trader    [phone]) Pager  [phone]) Office    February 20, 2019 3:56 PM

## 2019-02-20 NOTE — Unmapped (Signed)
[facility]   History and Physical    Assessment/Plan:    Principal Problem:    Alcohol withdrawal seizure (CMS-HCC)  Active Problems:    Severe alcohol use disorder (CMS-HCC)    Recurrent falls    Pancytopenia (CMS-HCC)      Nicholas Rush is a 54 y.o. male with PMHx of HTN, anxiety, depressionm severe alcohol use diorder, GERd, psoriasis (on humira) who presents to [facility] with Alcohol withdrawal seizure (CMS-HCC).    Seizure secondary to alcohol withdrawal - Severe alcohol use disorder: Current everyday alcohol consumption, up to 1/5th of bourbon daily without known prior history of complicated alcohol withdrawals.  His presentation does seem consistent with a true seizure given reported loss of consciousness, tonic-clonic motions, and postictal period.  Patient variably reports decreased alcohol consumption over the past 3 weeks with recurrent falls/positive LOC/reported head trauma.    - s/p Ativan 4 mg total in ED   - Continue CIWA - symptom triggered ativan protocol  - Start Librium 25 mg TID, plan for taper   - If persistently hypertensive/tachycardic, plan to start clonidine as adjunct of therapy withdrawal  -Thiamine/folate supplementation ordered  - CT head w/o contrast to rule-out structural etiologies for seizure, especially as patient reports no changes in EtOH consumption to me & reports recurrent falls w/ head trauma.    Recurrent falls: Patient versus recurrent falls with gait instability, with evidence of positive loss of consciousness and reported head trauma.  His exam is notable for nystagmus increasing suspicion for underlying Wernicke's.  - s/p thiamine 500 mg IV x1 in ED   - Continuing high-dose thiamine supplementation with 500 mg IV every 8 hours   - PT/OT consulted    Presumptive alcoholic hepatitis: Elevated transaminases on admission (AST 262, ALT 79), seemingly consistent with alcoholic hepatitis.  Mandery discriminant function score less than 32, indicating no utility of corticosteroids at this time.   - F/u hepatitis panel to exclude alternative etiologies   - monitor LFTs    Hyponatremia: NA 128 on admission. Secondary to ongoing alcohol use with low solute intake.  - We will defer on sending urine sodium/Cozzens as patient was already received 2.5 L of IV fluids  -Reassess sodium on a.m. BMP    Pancytopenia: Likely secondary to chronic alcohol use.   - Monitor CBCs    Hypokalemia, hypomagnesemia: Secondary to chronic alcohol use  - F/u lytes daily    Tobacco use disorder: Current everyday smoker, 1 pack/day x 30 years  -Nicotine patch ordered    Daily Checklist:  Diet: Regular  DVT ppx: Lovenox  Bowel regimen: Miralax PRN  Dispo: Admit to med H, stepdown status given presentation of seizures with expectation for complicated alcohol withdrawal admission    Code Status:  Full Code     Floor time 75 minutes minutes, > 50% spent in counseling and coordination of care about the following issues:  Review of prior medical records, current labs/imaging, coordination of care with the ED provider, discussions with patient.      ___________________________________________________________________    Chief Complaint  Chief Complaint   Patient presents with   ??? Seizure - New Onset       HPI:  Nicholas Rush is a 54 y.o. male with PMHx as noted below who presents to [facility] with Alcohol withdrawal seizure (CMS-HCC).    Patient reports having an episode of loss of consciousness this afternoon.  He reports his wife witnessing him fall to the  ground with associated tonic-clonic motions.  After regaining consciousness, he was reportedly somnolent without full return to mental status baseline.  Of note, patient has a significant alcohol use history of consuming 1/5 of bourbon daily.  He reports being admitted to a alcohol detox center but denies any prior history of complicated alcohol withdrawals including seizures, hallucinations, DTs.  Patient reports last consuming alcohol on day of presentation on 1/31.      Of note, per ED provider history patient endorsed relatively decreased amount of consumption in setting of recent surgery approximately 3 weeks ago.  However during my encounter, he still endorses 1/5 of alcohol consumption daily even during this postoperative.      Patient does note recurrent history of falls with numerous episodes of him losing consciousness and having associated head trauma.  He states his last fall was maybe about 3 weeks ago.    Since arrival to the ED, patient has been hypertensive with systolics up to 200, mildly tachycardic with heart rate 100???115, saturating greater than 93% on room air.  Labs in the ED were remarkable for pancytopenia (WBC 3.9, hemoglobin 12.9, platelets 84), sodium 128, potassium 3.2, AST 262, ALT 79, lactate 7.1 with repeat down to 4.0 after 1.5 L of IV fluids.  Alcohol level was initially 111, with subsequent downtrend to 55 after approximately 2 hours.        Allergies:  Hydroxyzine     Medications:   Prior to Admission medications    Medication Dose, Route, Frequency   ADALIMUMAB SYRINGE CITRATE FREE 40 MG/0.4 ML Inject the contents of 2 syringes (80 mg) under the skin on day 1. then inject the contents of 1 syringe (40 mg) every 14 days, beginning on day 8.   empty container Misc Use as directed to dispose of Humira syringes.   fluticasone propionate (FLONASE) 50 mcg/actuation nasal spray 2 sprays, Each Nare, Daily (standard)   halobetasol (ULTRAVATE) 0.05 % ointment Topical, 2 times a day (standard), To affected areas as needed.   omeprazole (PRILOSEC) 20 MG capsule 20 mg, Oral, Daily (standard)   sildenafiL, pulm.hypertension, (REVATIO) 20 mg tablet 2-5 tabs prior to intercourse as needed   vancomycin (VANCOCIN) 125 MG capsule 125 mg, Oral, Every 6 hours       Medical History:  Past Medical History:   Diagnosis Date   ??? Anxiety    ??? Depressive disorder    ??? Erectile dysfunction    ??? Esophageal reflux    ??? History of pyloric stenosis    ??? Hypertension ??? Insomnia    ??? Psoriasis     Dermatologist - Dr. Deloria Lair in [facility]   ??? Seizure (CMS-HCC) 02/20/2019   ??? Tobacco use     1ppd       Surgical History:  Past Surgical History:   Procedure Laterality Date   ??? PYLOROMYOTOMY     ??? WISDOM TOOTH EXTRACTION         Social History:  Social History     Social History Narrative    Lives in [place] with wife and two children.  Works [employer].  Current smoker.  Alcohol intake is excessive.  On some days it's 12 beers.                 Social History     Tobacco Use   ??? Smoking status: Current Every Day Smoker     Packs/day: 1.00     Years: 30.00     Pack years: 30.00  Types: Cigarettes   ??? Smokeless tobacco: Never Used   Substance Use Topics   ??? Alcohol use: Yes     Alcohol/week: 42.0 standard drinks     Types: 42 Cans of beer per week     Comment: 6 pack every day   ??? Drug use: No       Family History:  Family History   Problem Relation Age of Onset   ??? Bipolar disorder Mother    ??? Depression Sister    ??? Alcohol abuse Sister    ??? Alcohol abuse Maternal Aunt    ??? Melanoma Neg Hx    ??? Basal cell carcinoma Neg Hx    ??? Squamous cell carcinoma Neg Hx        Review of Systems:  10 systems reviewed and are negative unless otherwise mentioned in HPI      Physical Exam:  Heart Rate:  [93-118] 109  SpO2 Pulse:  [113-115] 113  Resp:  [16-28] 24  BP: (158-200)/(89-123) 187/115  SpO2:  [93 %-97 %] 93 %  There is no height or weight on file to calculate BMI.    General: Chronically ill-appearing, mildly uncomfortable appearing  HEENT: anicteric sclera, no conjunctival injection, moist mucous membranes, PERRL  Neck: No JVD, no palpable lymphadenopathy  Cardiac: Tachycardic, regular rhythm, no appreciable murmurs  Pulm: Normal work of breathing, clear breath sounds bilaterally, no wheezes or crackles  Abd: Nondistended, normoactive bowel sounds, nontender, no guarding or rebound  Extrem: Extremities warm peripherally, no edema  Neuro: Moves all extremities spontaneously, significant nystagmus on extraocular motions, tremulous in upper extremities, no auditory or visual hallucinations, gait not assessed due to reported dizziness/lightheadedness with recurrent falls  Psych: Alert and oriented, participates fully in interview    Test Results:  Data Review:  I have reviewed the labs and studies from the last 24 hours.    Imaging: CT head pending                  EKG: Personally reviewed, notable for:  NSR, QTc 480

## 2019-02-20 NOTE — Unmapped (Signed)
Bed: 16B  Expected date:   Expected time:   Means of arrival:   Comments:  EMS

## 2019-02-20 NOTE — Unmapped (Signed)
[facility] Emergency Department Provider Note    Patient Identification  Nicholas Rush  Patient information was obtained from patient, EMS personnel and past medical records.  History/Exam limitations: none.    ED Clinical Impression     Final diagnoses:   Seizure-like activity (CMS-HCC) (Primary)   Alcohol abuse       Initial Impression, ED Course, Assessment and Plan     Impression: This is a 54 year old-year-old male with history of hypertension, anxiety, depression, alcohol abuse (drinks 1/5 of liquor every day), GERD, psoriasis (on Humira), who presents with EMS for seizure-like activity just prior to arrival.  Per EMS, patient's girlfriend noted that he was acting his normal self today, however was lying on the sofa and began having generalized tonic-clonic like seizure-like activity that lasted 2 to 3 minutes in duration and spontaneously resolved.  When EMS arrived, he was postictal appearing, confused, but trying to follow commands.      Initial vital signs mildly tachycardic at 105, but otherwise reassuring and afebrile and satting well on RA.  Patient chronically ill and disheveled appearing, smells of EtOH.  A&Ox4. Nonlabored breathing and clear lungs.  Patient has mild tachycardia, but otherwise reassuring cardiac exam.  No lower extremity edema, symmetric in size nontender palpation.  Head normocephalic atraumatic and midface stable.  No signs of dental trauma.  Extremities without gross signs of trauma, deformity, swelling, ecchymosis, lacerations or abrasions.  No midline spinous tenderness, step-offs or deformities.  Abdomen is soft and nontender to palpation.  Patient does have a fine bilateral upper extremity tremor.  Neurologic examination with CN II-XII intact and 5/5 strength bilateral UE and 4+/5 strength bilateral LE. Sensation to light touch intact. Patient able to ambulate with some assistance but slightly wobbly on his feet.     Suspect patient truly did have a seizure 2/2 unknown cause but includes EtOH withdrawal, electrolyte or metabolic derangement. Low concern for infectious etiology vs. Intracranial abnormality given no systemic infectious symptoms and normal neurologic examination. I do have concern for potential Wernicke's-Korsakoff or other EtOH induced/thiamine deficient neurologic/cerebellar issues with his falls and difficulty ambulating over the past year.     Patient remains on the cardiac monitor.  Will obtain an EKG, basic lab, lactate, alcohol level, urinalysis, LFTs.  We will continue to monitor and repeat neurologic examination.  We will give another liter IV fluid bolus. Will give thiamine and folate IV. Will place on CIWA protocol. Patient does want EtOH detox at this time.     EKG with NSR and mildly prolonged QTc at 480, but no ST or T wave abnormalities concerning for acute ischemia. Initial lactate 7.1 (suspect 2/2 seizure like activity). Low concern for infectious etiology. We will repeat after 2L IVF bolus. Initial CIWA 8 (given 2mg  IV ativan). Labs with slight leukopenia at 3.9, stable hemoglobin. Patient has hypoNa at 128 (new since 10/2018) and hypokalemia at 3.2 (will give KCl). Cr WNLs. LFTs elevated at 262 and 79, AST, ALT, respectively. EtOH level 111. Will repeat EtOH level at 23:00 to see if <80 and then page [facility]O for admission.     I spoke with [facility] who states that neurology has been admitting some alcohol withdrawal patients to help off-load given COVID-19 pandemic. Will page neurology resident. EtOH level now 55. Patient has received a total of 4mg  IV ativan since arrival.     Disposition: admit    Additional Medical Decision Making     I independently visualized the EKG  tracing.   I reviewed the patient's prior medical records.   I discussed the case with the [facility]/admitting provider. Neurology provider.     Any labs and radiology results that were available during my care of the patient were independently reviewed by me and considered in my medical decision making.    Portions of this record have been created using Scientist, clinical (histocompatibility and immunogenetics). Dictation errors have been sought, but may not have been identified and corrected.  ____________________________________________    I have reviewed the triage vital signs and the nursing notes.      Patient History     Chief Complaint  Seizure - New Onset      HPI   Nicholas Rush is a 54 y.o. male with history of hypertension, anxiety, depression, alcohol abuse (drinks 1/5 of liquor every day), GERD, psoriasis (on Humira), who presents with EMS for seizure-like activity just prior to arrival.  Per EMS, patient's girlfriend noted that he was acting his normal self today, however was lying on the sofa and began having generalized tonic-clonic like seizure-like activity that lasted 2 to 3 minutes in duration and spontaneously resolved.  When EMS arrived, he was postictal appearing, confused, but trying to follow commands.  Unable to provide much history, but denies any pain or complaints.  Patient was given 1 L IV fluid bolus in route, but no medications.  Reportedly, patient ran out of his hypertension medications, but is not on any antiepileptics.  Girlfriend states that he has had 1 seizure in the past at least 1 year ago, but has never had formal neurology evaluation.  Patient reportedly drank his normal amount of alcohol today.  Last drink was 3 hours ago, however patient did have a recent surgery to his L pinky finger 2-3 weeks ago and since then has been very tired and sleeping a lot more. He states he normally drinks throughout the day so given the increased time sleeping could be consuming less EtOH. He does report recurrent falls and orthostasis over the past year of which he has been following with his PCP for and was taken off his home antihypertensives. Intermittently takes a multivitamin at home.  Denies any other drug use.  Denies recent fevers, chills, nausea, vomiting, diarrhea.  Patient had no urinary or stool incontinence and no tongue biting.  He did not fall or strike his head.  Remained on the sofa the entire time.  No other alleviating or aggravating factors. Last thing patient remembers was sitting on the cough watching basketball.      Past Medical History:   Diagnosis Date   ??? Anxiety    ??? Depressive disorder    ??? Erectile dysfunction    ??? Esophageal reflux    ??? History of pyloric stenosis    ??? Hypertension    ??? Insomnia    ??? Psoriasis     Dermatologist - Dr. Deloria Lair in [facility]   ??? Tobacco use     1ppd       Patient Active Problem List   Diagnosis   ??? Depressive disorder, not elsewhere classified   ??? Anxiety state   ??? Essential hypertension   ??? Insomnia   ??? Psoriasis   ??? Alcohol abuse, episodic   ??? Psoriatic arthritis (CMS-HCC)   ??? Positive colorectal cancer screening using DNA-based stool test   ??? Diarrhea of presumed infectious origin       Past Surgical History:   Procedure Laterality Date   ??? PYLOROMYOTOMY     ???  WISDOM TOOTH EXTRACTION         No current facility-administered medications for this encounter.     Current Outpatient Medications:   ???  ADALIMUMAB SYRINGE CITRATE FREE 40 MG/0.4 ML, Inject the contents of 2 syringes (80 mg) under the skin on day 1. then inject the contents of 1 syringe (40 mg) every 14 days, beginning on day 8., Disp: 4 each, Rfl: 11  ???  empty container Misc, Use as directed to dispose of Humira syringes., Disp: 1 each, Rfl: 2  ???  fluticasone propionate (FLONASE) 50 mcg/actuation nasal spray, 2 sprays by Each Nare route daily., Disp: 16 g, Rfl: 12  ???  halobetasol (ULTRAVATE) 0.05 % ointment, Apply topically Two (2) times a day. To affected areas as needed., Disp: 60 g, Rfl: 10  ???  omeprazole (PRILOSEC) 20 MG capsule, Take 1 capsule (20 mg total) by mouth daily., Disp: 30 capsule, Rfl: 12  ???  sildenafiL, pulm.hypertension, (REVATIO) 20 mg tablet, 2-5 tabs prior to intercourse as needed, Disp: 30 tablet, Rfl: 5  ???  vancomycin (VANCOCIN) 125 MG capsule, Take 1 capsule (125 mg total) by mouth Every six (6) hours., Disp: 40 capsule, Rfl: 0    Allergies  Hydroxyzine    Family History   Problem Relation Age of Onset   ??? Bipolar disorder Mother    ??? Depression Sister    ??? Alcohol abuse Sister    ??? Alcohol abuse Maternal Aunt    ??? Melanoma Neg Hx    ??? Basal cell carcinoma Neg Hx    ??? Squamous cell carcinoma Neg Hx        Social History  Social History     Tobacco Use   ??? Smoking status: Current Every Day Smoker     Packs/day: 1.00     Years: 30.00     Pack years: 30.00     Types: Cigarettes   ??? Smokeless tobacco: Never Used   Substance Use Topics   ??? Alcohol use: Yes     Alcohol/week: 42.0 standard drinks     Types: 42 Cans of beer per week     Comment: 6 pack every day   ??? Drug use: No       Review of Systems  General: no fevers, chills  Cardiac: no CP, SOB, palpitations  Pulmonary: no cough, sputum production, hemoptysis  A 10 point review of systems was negative except for those previously mentioned in the HPI and above.    Physical Exam     Vitals:    02/19/19 2038   BP: 160/100   Pulse: 105   Resp: 16   SpO2: 97%     Constitutional: Chronically ill and disheveled appearing, smells of EtOH-appearing, alert and follows simple commands.  Oriented x 4  ENT:       Head: Show Low/AT       Eyes: PERRL, conjunctiva clear, sclera anicteric       Nose: no congestion, epistaxis or septal hematoma       Mouth/Throat: MM moist without signs of dental trauma or tongue laceration       Neck: supple, midline trachea, no tenderness or cervical adenopathy.  No midline spinous tenderness, step-offs or deformities  Cardiac: mild tachycardia, normal S1, S2, no appreciable murmurs or rubs. Distal pulses present and symmetrical B/L  Respiratory: CTA bilaterally, non-labored breathing, no stridor, wheezing or crackles  Abdomen: soft, NT, normal BS. No masses, rebound or guarding  Back: No midline spinous tenderness, step-offs  or deformities.  Extremities: LE normal with no CCE, symmetric in size and NTTP.  No gross signs of trauma, deformity, swelling, ecchymosis, lacerations or abrasions  Skin: warm and dry, no rashes or lesions  Neurologic: no gross focal neurologic deficits appreciated. CN II-XII intact. PERRL, EOMI. 5/5 strength bilateral UE and LE. Sensation intact to light touch. Fine tremor of the bilateral UE  Psychiatric: normal mood, affect, speech and behavior    EKG     Ventricular rate 95  PR interval 124  QRS 76   QTc 480    Normal sinus rhythm EKG. Mildly prolonged QTc. Narrow QRS, normal QTc. No acute ST or T wave changes consistent with ischemia.        Rosalio Macadamia, MD  Resident  02/19/19 [phone]

## 2019-02-20 NOTE — Unmapped (Signed)
Pt taken by RN on monitor on stretcher, report given prior to transport. Pt belongings at bedside, care transferred at this time.

## 2019-02-20 NOTE — Unmapped (Signed)
Daily Progress Note    Assessment/Plan:    Principal Problem:    Alcohol withdrawal seizure (CMS-HCC)  Active Problems:    Severe alcohol use disorder (CMS-HCC)    Recurrent falls    Pancytopenia (CMS-HCC)  Resolved Problems:    * No resolved hospital problems. *                 Nicholas Rush is a 54 y.o. male who presented to [facility] with Alcohol withdrawal seizure (CMS-HCC).    Seizure secondary to alcohol withdrawal - Severe alcohol use disorder: Current everyday alcohol consumption, up to 1/5th of bourbon and 10 individual fireballs daily without known prior history of complicated alcohol withdrawals.  His presentation does seem consistent with a true seizure given reported loss of consciousness, tonic-clonic motions, and postictal period.  Patient variably reports decreased alcohol consumption over the past 3 weeks with recurrent falls/positive LOC/reported head trauma. Per wife, she has found him on the floor of the bathroom, incontinent of urine, and lights are on but nobody's home for several minutes. CT head w/o contrast to rule-out structural etiologies for seizure negative for acute intracranial processes.  Possible seizures at home, however, would need pt to get sober to distinguish between alcohol withdrawal seizure and true seizures. On CIWA protocol with surprising low scores, <10 overnight.   - Continue CIWA  With symptom triggered ativan protocol  - continue Librium 25 mg TID  - start clonidine 0.2mg  TID, and clonidine patch 0.1mg  weekly  -Thiamine/folate supplementation ordered  - CM to assist with resources for substance abuse     ??  Recurrent falls: Patient reports recurrent falls with gait instability, with evidence of positive loss of consciousness and reported head trauma.  His exam on admission was notable for nystagmus increasing suspicion for underlying Wernicke's. Concern for possible seizures at home given falls, loss of bladder control, and confusion post fall.    - s/p thiamine 500 mg IV x1 in ED   - Continuing high-dose thiamine supplementation with 500 mg IV every 8 hours   - PT/OT consulted  ??  Presumptive alcoholic hepatitis: Elevated transaminases on admission (AST 262, ALT 79), seemingly consistent with alcoholic hepatitis.  Madrey's discriminant function score less than 32, indicating no utility of corticosteroids at this time. Hepatitis panel non-reactive. LFT's improving  - continue monitor LFTs  ??  Hyponatremia: NA 128 on admission. Secondary to ongoing alcohol use with low solute intake. received 2.5 L of IV fluids in ED. Repeat sodium 130 this am  -continue to monitor  ??  Pancytopenia: Likely secondary to chronic alcohol use.   - Monitor CBCs  ??  Hypokalemia, hypomagnesemia: Secondary to chronic alcohol use.  - given 40 PO KCl, and 2g IV mag   - F/u lytes daily  ??  Tobacco use disorder: Current everyday smoker, 1 pack/day x 30 years  -Nicotine patch ordered  ??  Daily Checklist:  Diet: Regular  DVT ppx: Lovenox  Bowel regimen: Miralax PRN  Dispo: , stepdown status given presentation of seizures with expectation for complicated alcohol withdrawal admission, pt considering substance abuse treatment  ___________________________________________________________________    Subjective:  Pt feeling ok this morning. Denies Auditory or visual hallucinations. He reports that he is interested in substance abuse treatment. He is concerned with his ongoing falls he has been having. He sometimes gets a warning that he is going to fall or pass out, but other times, his legs just give out.  He has hit his  head several times recently.      I spoke with pt's wife [name] via phone. [name] had concerns about pt's falls and ongoing alcohol use. She reports that he has 2 types of falls, one where he stands up and will immediately fall, and one where he falls while up walking (looks like his legs give out). After a fall he usually has a period of confusion, where, per the wife, the lights are on, but no one is home.  She has found him multiple times recently on the floor of the bathroom, where he has lost control of his bladder, and he is completely out of it and confused for a few minutes before coming around.     Floor time 45 minutes, > 50% spent in counseling, coordination of care about the following issues:  ETOH abuse, ETOH withdrawal, Seizures, falls, electrolyte imbalances, and discussing plan of care with family, nursing and case manager.    Recent Results (from the past 24 hour(s))   Comprehensive Metabolic Panel    Collection Time: 02/19/19  8:48 PM   Result Value Ref Range    Sodium 128 (L) 135 - 145 mmol/L    Potassium 3.2 (L) 3.5 - 5.0 mmol/L    Chloride 98 98 - 107 mmol/L    Anion Gap 9 7 - 15 mmol/L    CO2 21.0 (L) 22.0 - 30.0 mmol/L    BUN 4 (L) 7 - 21 mg/dL    Creatinine 8.11 (L) 0.70 - 1.30 mg/dL    BUN/Creatinine Ratio 6     EGFR CKD-EPI Non-African American, Male >90 >=60 mL/min/1.63m2    EGFR CKD-EPI African American, Male >90 >=60 mL/min/1.65m2    Glucose 87 70 - 179 mg/dL    Calcium 7.6 (L) 8.5 - 10.2 mg/dL    Albumin 3.2 (L) 3.5 - 5.0 g/dL    Total Protein 6.9 6.5 - 8.3 g/dL    Total Bilirubin 1.1 0.0 - 1.2 mg/dL    AST 914 (H) 19 - 55 U/L    ALT 79 (H) <50 U/L    Alkaline Phosphatase 141 (H) 38 - 126 U/L   Ethanol    Collection Time: 02/19/19  8:48 PM   Result Value Ref Range    Alcohol, Ethyl 111.0 Undefined mg/dL   Lactic Acid, Venous, Whole Blood    Collection Time: 02/19/19  8:48 PM   Result Value Ref Range    Lactate, Venous 7.1 (HH) 0.5 - 1.8 mmol/L   CBC w/ Differential    Collection Time: 02/19/19  8:48 PM   Result Value Ref Range    WBC 3.9 (L) 4.5 - 11.0 10*9/L    RBC 3.44 (L) 4.50 - 5.90 10*12/L    HGB 12.9 (L) 13.5 - 17.5 g/dL    HCT 78.2 (L) 95.6 - 53.0 %    MCV 112.8 (H) 80.0 - 100.0 fL    MCH 37.7 (H) 26.0 - 34.0 pg    MCHC 33.4 31.0 - 37.0 g/dL    RDW 21.3 08.6 - 57.8 %    MPV 9.7 7.0 - 10.0 fL    Platelet 84 (L) 150 - 440 10*9/L    Neutrophils % 69.7 % Lymphocytes % 20.4 %    Monocytes % 5.3 %    Eosinophils % 2.2 %    Basophils % 0.7 %    Absolute Neutrophils 2.7 2.0 - 7.5 10*9/L    Absolute Lymphocytes 0.8 (L) 1.5 - 5.0 10*9/L    Absolute Monocytes  0.2 0.2 - 0.8 10*9/L    Absolute Eosinophils 0.1 0.0 - 0.4 10*9/L    Absolute Basophils 0.0 0.0 - 0.1 10*9/L    Large Unstained Cells 2 0 - 4 %    Macrocytosis Marked (A) Not Present   Morphology Review    Collection Time: 02/19/19  8:48 PM   Result Value Ref Range    Smear Review Comments See Comment (A) Undefined   Magnesium Level    Collection Time: 02/19/19  8:48 PM   Result Value Ref Range    Magnesium 0.9 (LL) 1.6 - 2.2 mg/dL   ECG 12 lead (Adult)    Collection Time: 02/19/19  8:54 PM   Result Value Ref Range    EKG Systolic BP  mmHg    EKG Diastolic BP  mmHg    EKG Ventricular Rate 95 BPM    EKG Atrial Rate 95 BPM    EKG P-R Interval 124 ms    EKG QRS Duration 76 ms    EKG Q-T Interval 382 ms    EKG QTC Calculation 480 ms    EKG Calculated P Axis 55 degrees    EKG Calculated R Axis 45 degrees    EKG Calculated T Axis 34 degrees    QTC Fredericia 445 ms   Lactic Acid, Venous, Whole Blood    Collection Time: 02/19/19  9:39 PM   Result Value Ref Range    Lactate, Venous 4.0 (H) 0.5 - 1.8 mmol/L   Urinalysis with Culture Reflex    Collection Time: 02/19/19 10:29 PM    Specimen: Clean Catch; Urine   Result Value Ref Range    Color, UA Amber     Clarity, UA Hazy     Specific Gravity, UA 1.015 1.003 - 1.030    pH, UA 6.0 5.0 - 9.0    Leukocyte Esterase, UA Negative Negative    Nitrite, UA Negative Negative    Protein, UA 30 mg/dL (A) Negative    Glucose, UA Negative Negative    Ketones, UA 20 mg/dL (A) Negative    Urobilinogen, UA 4.0 mg/dL (A) 0.2 mg/dL, 1.0 mg/dL    Bilirubin, UA Negative Negative    Blood, UA Negative Negative    RBC, UA 1 <=3 /HPF    WBC, UA 1 <=2 /HPF    Squam Epithel, UA <1 0 - 5 /HPF    Bacteria, UA None Seen None Seen /HPF    Hyaline Casts, UA 1 0 - 1 /LPF    Mucus, UA Occasional (A) None Seen /HPF   COVID-19 PCR    Collection Time: 02/19/19 10:29 PM    Specimen: Nasopharyngeal Swab   Result Value Ref Range    SARS-CoV-2 PCR Not Detected Not Detected   Ethanol    Collection Time: 02/19/19 10:29 PM   Result Value Ref Range    Alcohol, Ethyl 55.0 Undefined mg/dL   Lactic Acid, Venous, Whole Blood    Collection Time: 02/20/19 12:19 AM   Result Value Ref Range    Lactate, Venous 2.6 (H) 0.5 - 1.8 mmol/L   PT-INR    Collection Time: 02/20/19 12:19 AM   Result Value Ref Range    PT 12.2 10.5 - 13.5 sec    INR 1.03    CBC    Collection Time: 02/20/19  7:53 AM   Result Value Ref Range    WBC 4.6 4.5 - 11.0 10*9/L    RBC 3.32 (L) 4.50 - 5.90 10*12/L  HGB 12.8 (L) 13.5 - 17.5 g/dL    HCT 16.1 (L) 09.6 - 53.0 %    MCV 111.7 (H) 80.0 - 100.0 fL    MCH 38.6 (H) 26.0 - 34.0 pg    MCHC 34.5 31.0 - 37.0 g/dL    RDW 04.5 40.9 - 81.1 %    MPV 10.4 (H) 7.0 - 10.0 fL    Platelet 72 (L) 150 - 440 10*9/L   Magnesium Level    Collection Time: 02/20/19  7:53 AM   Result Value Ref Range    Magnesium 1.4 (L) 1.6 - 2.2 mg/dL   Comprehensive Metabolic Panel    Collection Time: 02/20/19  7:53 AM   Result Value Ref Range    Sodium 130 (L) 135 - 145 mmol/L    Potassium 3.3 (L) 3.5 - 5.0 mmol/L    Chloride 99 98 - 107 mmol/L    Anion Gap 5 (L) 7 - 15 mmol/L    CO2 26.0 22.0 - 30.0 mmol/L    BUN 5 (L) 7 - 21 mg/dL    Creatinine 9.14 (L) 0.70 - 1.30 mg/dL    BUN/Creatinine Ratio 8     EGFR CKD-EPI Non-African American, Male >90 >=60 mL/min/1.79m2    EGFR CKD-EPI African American, Male >90 >=60 mL/min/1.34m2    Glucose 117 70 - 179 mg/dL    Calcium 7.6 (L) 8.5 - 10.2 mg/dL    Albumin 3.3 (L) 3.5 - 5.0 g/dL    Total Protein 7.0 6.5 - 8.3 g/dL    Total Bilirubin 1.9 (H) 0.0 - 1.2 mg/dL    AST 782 (H) 19 - 55 U/L    ALT 78 (H) <50 U/L    Alkaline Phosphatase 153 (H) 38 - 126 U/L     Labs/Studies:  Labs and Studies from the last 24hrs per EMR and Reviewed    Objective:  Temp:  [36.3 ??C (97.3 ??F)-37.1 ??C (98.8 ??F)] 36.9 ??C (98.4 ??F)  Heart Rate:  [93-150] 102  SpO2 Pulse:  [98-126] 103  Resp:  [16-28] 22  BP: (156-200)/(66-123) 160/98  SpO2:  [93 %-100 %] 99 %    GEN: disheveled gentleman, lying in bed, in NAD  HEENT: EOMI, Sclera anicteric, MMM  CARDS: tachycardic, regular rhythm, No M/R/G.  PULM: CTA B, no wheezes or crackles, no increased work of breathing  PSYCH: alert, oriented x3  ABD: soft, nondistended, nontender, BS normoactive  EXTREM: no LE edema  NEURO: grossly nonfocal, mild nystagmus noted, +tremors, moving all extremities

## 2019-02-20 NOTE — Unmapped (Signed)
Patient BIB [facility] for further eval of new onset seizure. Per EMS patient drinks a fifth of liquor per day. nad noted. Alert/oriented.

## 2019-02-21 LAB — COMPREHENSIVE METABOLIC PANEL
ALBUMIN: 3 g/dL — ABNORMAL LOW (ref 3.5–5.0)
ALKALINE PHOSPHATASE: 144 U/L — ABNORMAL HIGH (ref 38–126)
ALT (SGPT): 62 U/L — ABNORMAL HIGH (ref <50)
ANION GAP: 8 mmol/L (ref 7–15)
BILIRUBIN TOTAL: 1.6 mg/dL — ABNORMAL HIGH (ref 0.0–1.2)
BLOOD UREA NITROGEN: 6 mg/dL — ABNORMAL LOW (ref 7–21)
BUN / CREAT RATIO: 9
CALCIUM: 7.8 mg/dL — ABNORMAL LOW (ref 8.5–10.2)
CHLORIDE: 96 mmol/L — ABNORMAL LOW (ref 98–107)
CO2: 26 mmol/L (ref 22.0–30.0)
CREATININE: 0.64 mg/dL — ABNORMAL LOW (ref 0.70–1.30)
EGFR CKD-EPI AA MALE: >90 mL/min/{1.73_m2} (ref >=60)
EGFR CKD-EPI NON-AA MALE: >90 mL/min/{1.73_m2} (ref >=60)
GLUCOSE RANDOM: 91 mg/dL (ref 70–179)
POTASSIUM: 3.4 mmol/L — ABNORMAL LOW (ref 3.5–5.0)
PROTEIN TOTAL: 6.6 g/dL (ref 6.5–8.3)
SODIUM: 130 mmol/L — ABNORMAL LOW (ref 135–145)

## 2019-02-21 LAB — CBC
HEMATOCRIT: 35.5 % — ABNORMAL LOW (ref 41.0–53.0)
HEMOGLOBIN: 11.9 g/dL — ABNORMAL LOW (ref 13.5–17.5)
MEAN CORPUSCULAR HEMOGLOBIN CONC: 33.4 g/dL (ref 31.0–37.0)
MEAN CORPUSCULAR HEMOGLOBIN: 37.9 pg — ABNORMAL HIGH (ref 26.0–34.0)
MEAN PLATELET VOLUME: 11.1 fL — ABNORMAL HIGH (ref 7.0–10.0)
PLATELET COUNT: 62 10*9/L — ABNORMAL LOW (ref 150–440)
RED BLOOD CELL COUNT: 3.14 10*12/L — ABNORMAL LOW (ref 4.50–5.90)
RED CELL DISTRIBUTION WIDTH: 14.5 % (ref 12.0–15.0)

## 2019-02-21 LAB — CHLORIDE: Chloride:SCnc:Pt:Ser/Plas:Qn:: 96 — ABNORMAL LOW

## 2019-02-21 LAB — MAGNESIUM: Magnesium:MCnc:Pt:Ser/Plas:Qn:: 1.5 — ABNORMAL LOW

## 2019-02-21 LAB — RED CELL DISTRIBUTION WIDTH: Lab: 14.5

## 2019-02-21 NOTE — Unmapped (Signed)
Pt oriented x4, sleepy most of the time but easily arousable, denies any pain or discomfort or visual disturbances or headache. Pt wife called here and was updated on pt status, reading glasses delivered to bedside by the gift shop. Pt up to the bedside commode, a little impulsive. Po intake encouraged and pt encouraged to call for nay needs. Will continue monitoring.  Problem: Adult Inpatient Plan of Care  Goal: Plan of Care Review  Outcome: Progressing  Goal: Patient-Specific Goal (Individualization)  Outcome: Progressing  Goal: Absence of Hospital-Acquired Illness or Injury  Outcome: Progressing  Goal: Optimal Comfort and Wellbeing  Outcome: Progressing  Goal: Readiness for Transition of Care  Outcome: Progressing  Goal: Rounds/Family Conference  Outcome: Progressing     Problem: Fall Injury Risk  Goal: Absence of Fall and Fall-Related Injury  Outcome: Progressing     Problem: Hypertension Comorbidity  Goal: Blood Pressure in Desired Range  Outcome: Progressing

## 2019-02-21 NOTE — Unmapped (Signed)
Daily Progress Note    Assessment/Plan:    Principal Problem:    Alcohol withdrawal seizure (CMS-HCC)  Active Problems:    Severe alcohol use disorder (CMS-HCC)    Recurrent falls    Pancytopenia (CMS-HCC)  Resolved Problems:    * No resolved hospital problems. *                 Nicholas Rush is a 54 y.o. male who presented to [facility] with Alcohol withdrawal seizure (CMS-HCC).    Seizure secondary to alcohol withdrawal - Severe alcohol use disorder: Current everyday alcohol consumption, up to 1/5th of bourbon and 10 individual fireballs daily without known prior history of complicated alcohol withdrawals.  His presentation does seem consistent with a true seizure given reported loss of consciousness, tonic-clonic motions, and postictal period.  Patient variably reports decreased alcohol consumption over the past 3 weeks with recurrent falls/positive LOC/reported head trauma. Per wife, she has found him on the floor of the bathroom, incontinent of urine, and lights are on but nobody's home for several minutes. CT head w/o contrast to rule-out structural etiologies for seizure negative for acute intracranial processes.  Possible seizures at home, however, would need pt to get sober to distinguish between alcohol withdrawal seizure and true seizures. Continued on  CIWA protocol with scores remaining <10.  - Continue CIWA  With symptom triggered ativan protocol  - continue Librium 25 mg TID  - continue clonidine 0.2mg  TID, and clonidine patch 0.1mg  weekly, does have previous hx of HTN  -Thiamine/folate supplementation ordered  - CM to assist with resources for substance abuse     ??  Recurrent falls: Patient reports recurrent falls with gait instability, with evidence of positive loss of consciousness and reported head trauma.  His exam on admission was notable for nystagmus increasing suspicion for underlying Wernicke's. Concern for possible seizures at home given falls, loss of bladder control, and confusion post fall.    - s/p thiamine 500 mg IV x1 in ED   - Continuing high-dose thiamine supplementation with 500 mg IV every 8 hours   - PT/OT consulted  ??  Presumptive alcoholic hepatitis: Elevated transaminases on admission (AST 262, ALT 79), seemingly consistent with alcoholic hepatitis.  Madrey's discriminant function score less than 32, indicating no utility of corticosteroids at this time. Hepatitis panel non-reactive. LFT's improving  - continue monitor LFTs  - could consider naltrexone at discharge   ??  Hyponatremia: NA 128 on admission. Secondary to ongoing alcohol use with low solute intake. received 2.5 L of IV fluids in ED. Repeat sodium 130 this am  - continue to monitor  ??  Pancytopenia: Likely secondary to chronic alcohol use. Plts 62  - Monitor CBCs  ??  Hypokalemia, hypomagnesemia: Secondary to chronic alcohol use.  - given 40 PO KCl, and 2g IV mag on 2/1 and again today  - F/u lytes daily  ??  Tobacco use disorder: Current everyday smoker, 1 pack/day x 30 years  -Nicotine patch ordered  ??  Daily Checklist:  Diet: Regular  DVT ppx: Lovenox  Bowel regimen: Miralax PRN  Dispo: , stepdown status given presentation of seizures with possibility of complicated alcohol withdrawal possible, pt considering substance abuse treatment  ___________________________________________________________________    Subjective:  Pt feeling pretty good this morning. Pt able to eating and drinking well, no nausea or diarrhea. Mild tremors, denied auditory or visual hallucinations.       Recent Results (from the past 24 hour(s))  CBC    Collection Time: 02/21/19  5:01 AM   Result Value Ref Range    WBC 4.6 4.5 - 11.0 10*9/L    RBC 3.14 (L) 4.50 - 5.90 10*12/L    HGB 11.9 (L) 13.5 - 17.5 g/dL    HCT 09.6 (L) 04.5 - 53.0 %    MCV 113.2 (H) 80.0 - 100.0 fL    MCH 37.9 (H) 26.0 - 34.0 pg    MCHC 33.4 31.0 - 37.0 g/dL    RDW 40.9 81.1 - 91.4 %    MPV 11.1 (H) 7.0 - 10.0 fL    Platelet 62 (L) 150 - 440 10*9/L   Magnesium Level    Collection Time: 02/21/19  5:01 AM   Result Value Ref Range    Magnesium 1.5 (L) 1.6 - 2.2 mg/dL   Comprehensive Metabolic Panel    Collection Time: 02/21/19  5:01 AM   Result Value Ref Range    Sodium 130 (L) 135 - 145 mmol/L    Potassium 3.4 (L) 3.5 - 5.0 mmol/L    Chloride 96 (L) 98 - 107 mmol/L    Anion Gap 8 7 - 15 mmol/L    CO2 26.0 22.0 - 30.0 mmol/L    BUN 6 (L) 7 - 21 mg/dL    Creatinine 7.82 (L) 0.70 - 1.30 mg/dL    BUN/Creatinine Ratio 9     EGFR CKD-EPI Non-African American, Male >90 >=60 mL/min/1.105m2    EGFR CKD-EPI African American, Male >90 >=60 mL/min/1.77m2    Glucose 91 70 - 179 mg/dL    Calcium 7.8 (L) 8.5 - 10.2 mg/dL    Albumin 3.0 (L) 3.5 - 5.0 g/dL    Total Protein 6.6 6.5 - 8.3 g/dL    Total Bilirubin 1.6 (H) 0.0 - 1.2 mg/dL    AST 956 (H) 19 - 55 U/L    ALT 62 (H) <50 U/L    Alkaline Phosphatase 144 (H) 38 - 126 U/L     Labs/Studies:  Labs and Studies from the last 24hrs per EMR and Reviewed    Objective:  Temp:  [36.5 ??C (97.7 ??F)-37.2 ??C (99 ??F)] 36.7 ??C (98 ??F)  Heart Rate:  [64-96] 81  Resp:  [18-23] 18  BP: (148-165)/(89-99) 152/89  SpO2:  [90 %-100 %] 97 %    GEN: disheveled gentleman, lying in bed, in NAD  HEENT: EOMI, Sclera anicteric, MMM  CARDS: RRR, No M/R/G.  PULM: CTA B, no wheezes or crackles, no increased work of breathing  PSYCH: alert, oriented x3  ABD: soft, nondistended, nontender, BS normoactive  EXTREM: no LE edema  NEURO: grossly nonfocal, no nystagmus, +tremors, moving all extremities

## 2019-02-21 NOTE — Unmapped (Signed)
CIWA Protocol continues. No PRN lorazepam required this shift.      Problem: Adult Inpatient Plan of Care  Goal: Plan of Care Review  Outcome: Progressing  Flowsheets (Taken 02/21/2019 0454)  Progress: no change  Plan of Care Reviewed With: patient  Goal: Absence of Hospital-Acquired Illness or Injury  Outcome: Progressing  Goal: Optimal Comfort and Wellbeing  Outcome: Progressing  Goal: Readiness for Transition of Care  Outcome: Progressing     Problem: Fall Injury Risk  Goal: Absence of Fall and Fall-Related Injury  Outcome: Progressing     Problem: Hypertension Comorbidity  Goal: Blood Pressure in Desired Range  Outcome: Progressing

## 2019-02-21 NOTE — Unmapped (Signed)
Nicholas Rush on behalf of Pt  Patient missed his appointment today due to a grand mal seizure.  He was admitted to the ED 02/19/2019.  He is currently in detox.  [name] wants you to be in contact with ED provider .  She states that they are considering a strict outpatient program or 30 day inpatient program   [name]  [phone]

## 2019-02-21 NOTE — Unmapped (Signed)
Problem: Adult Inpatient Plan of Care  Goal: Plan of Care Review  Outcome: Progressing  Patient denies chest pain, nausea, shortness of breath or dizziness.  No seizure activity noted.  Telemetry showing sinus rhythm/sinus tachycardia, heart rate 80s-140s.  Heart rate noted to be increased with activity and agitation.   Konrad Saha, NP notified regarding increased heart rate and in to see patient; IV fluid bolus ordered and given.     CIWA score currently 4.  Patient appears to be able to relax and noted to be resting with eyes closed following Librium administration.    Goal: Patient-Specific Goal (Individualization)  Outcome: Progressing  Goal: Absence of Hospital-Acquired Illness or Injury  Outcome: Progressing  Goal: Optimal Comfort and Wellbeing  Outcome: Progressing  Goal: Readiness for Transition of Care  Outcome: Progressing  Goal: Rounds/Family Conference  Outcome: Progressing     Problem: Fall Injury Risk  Goal: Absence of Fall and Fall-Related Injury  Outcome: Progressing

## 2019-02-22 LAB — COMPREHENSIVE METABOLIC PANEL
ALBUMIN: 2.8 g/dL — ABNORMAL LOW (ref 3.5–5.0)
ALT (SGPT): 46 U/L (ref <50)
AST (SGOT): 72 U/L — ABNORMAL HIGH (ref 19–55)
BILIRUBIN TOTAL: 1.1 mg/dL (ref 0.0–1.2)
BLOOD UREA NITROGEN: 8 mg/dL (ref 7–21)
BUN / CREAT RATIO: 12
CALCIUM: 7.8 mg/dL — ABNORMAL LOW (ref 8.5–10.2)
CHLORIDE: 100 mmol/L (ref 98–107)
CO2: 25 mmol/L (ref 22.0–30.0)
CREATININE: 0.69 mg/dL — ABNORMAL LOW (ref 0.70–1.30)
EGFR CKD-EPI AA MALE: >90 mL/min/{1.73_m2} (ref >=60)
EGFR CKD-EPI NON-AA MALE: >90 mL/min/{1.73_m2} (ref >=60)
GLUCOSE RANDOM: 108 mg/dL (ref 70–179)
POTASSIUM: 3.6 mmol/L (ref 3.5–5.0)
PROTEIN TOTAL: 6.3 g/dL — ABNORMAL LOW (ref 6.5–8.3)

## 2019-02-22 LAB — CBC
HEMATOCRIT: 33.8 % — ABNORMAL LOW (ref 41.0–53.0)
HEMOGLOBIN: 11.3 g/dL — ABNORMAL LOW (ref 13.5–17.5)
MEAN CORPUSCULAR HEMOGLOBIN CONC: 33.3 g/dL (ref 31.0–37.0)
MEAN PLATELET VOLUME: 11.2 fL — ABNORMAL HIGH (ref 7.0–10.0)
PLATELET COUNT: 56 10*9/L — ABNORMAL LOW (ref 150–440)
RED BLOOD CELL COUNT: 2.96 10*12/L — ABNORMAL LOW (ref 4.50–5.90)
RED CELL DISTRIBUTION WIDTH: 14.7 % (ref 12.0–15.0)
WBC ADJUSTED: 4.4 10*9/L — ABNORMAL LOW (ref 4.5–11.0)

## 2019-02-22 LAB — AST (SGOT): Aspartate aminotransferase:CCnc:Pt:Ser/Plas:Qn:: 72 — ABNORMAL HIGH

## 2019-02-22 LAB — MEAN CORPUSCULAR HEMOGLOBIN CONC: Erythrocyte mean corpuscular hemoglobin concentration:MCnc:Pt:RBC:Qn:Automated count: 33.3

## 2019-02-22 LAB — MAGNESIUM: Magnesium:MCnc:Pt:Ser/Plas:Qn:: 1.6

## 2019-02-22 LAB — HEPATITIS PANEL, ACUTE: HEPATITIS B CORE IGM ANTIBODY: NONREACTIVE

## 2019-02-22 LAB — HEPATITIS B CORE IGM ANTIBODY: Hepatitis B virus core Ab.IgM:PrThr:Pt:Ser:Ord:: NONREACTIVE

## 2019-02-22 NOTE — Unmapped (Signed)
Problem: Adult Inpatient Plan of Care  Goal: Plan of Care Review  Outcome: Progressing  Goal: Patient-Specific Goal (Individualization)  Outcome: Progressing  Goal: Absence of Hospital-Acquired Illness or Injury  Outcome: Progressing  Goal: Optimal Comfort and Wellbeing  Outcome: Progressing  Goal: Readiness for Transition of Care  Outcome: Progressing  Goal: Rounds/Family Conference  Outcome: Progressing     Problem: Fall Injury Risk  Goal: Absence of Fall and Fall-Related Injury  Outcome: Progressing     Problem: Hypertension Comorbidity  Goal: Blood Pressure in Desired Range  Outcome: Progressing     Problem: Self-Care Deficit  Goal: Improved Ability to Complete Activities of Daily Living  Outcome: Progressing   VSS throughout the shift. No complaints of pain or discomfort. All meds given as ordered. PT/OT work with pt this shift, pt tolerates well, walker per PT/OT recommendation. No acute event this shift. Fall free. Universal pandemic precaution maintained. Pt is resting in bed comfortably at present, call bell within reach. Will continue to monitor closely.

## 2019-02-22 NOTE — Unmapped (Signed)
Daily Progress Note    Assessment/Plan:    Principal Problem:    Alcohol withdrawal seizure (CMS-HCC)  Active Problems:    Severe alcohol use disorder (CMS-HCC)    Recurrent falls    Pancytopenia (CMS-HCC)  Resolved Problems:    * No resolved hospital problems. *                 Nicholas Rush is a 54 y.o. male who presented to [facility] with Alcohol withdrawal seizure (CMS-HCC).    Seizure secondary to alcohol withdrawal - Severe alcohol use disorder: Current everyday alcohol consumption, up to 1/5th of bourbon and 10 individual fireballs daily without known prior history of complicated alcohol withdrawals.  His presentation does seem consistent with a true seizure given reported loss of consciousness, tonic-clonic motions, and postictal period.  Patient variably reports decreased alcohol consumption over the past 3 weeks with recurrent falls/positive LOC/reported head trauma. Per wife, she has found him on the floor of the bathroom, incontinent of urine, and lights are on but nobody's home for several minutes. CT head w/o contrast to rule-out structural etiologies for seizure negative for acute intracranial processes.  Possible seizures at home, however, would need pt to get sober to distinguish between alcohol withdrawal seizure and true seizures. Continued on  CIWA protocol with scores remaining <10. Did have a witnessed fall overnight after getting out of bed overnight.  Did not hit his head. BP low at time of fall.   - Continue CIWA  With symptom triggered ativan protocol  - will start to taper librium, 25mg  TID today, will decrease to 25mg  BID tomorrow  - will stop clonidine for now given fall with orthostatic hypotension, see below  -Thiamine/folate supplementation ordered  - CM to assist with resources for substance abuse     ??  Recurrent falls: Patient reports recurrent falls with gait instability, with evidence of positive loss of consciousness and reported head trauma.  His exam on admission was notable for nystagmus increasing suspicion for underlying Wernicke's. Concern for possible seizures at home given falls, loss of bladder control, and confusion post fall.  Had been on antihypertensives at one point, but were discontinued d/t orthostatic hypotension with drinking. Had a fall last night while trying to get up out of bed.    - s/p thiamine 500 mg IV x1 in ED   - Continuing high-dose thiamine supplementation with 500 mg IV every 8 hours   - will stop clonidine, started for significant hypertension during withdrawal period. Now orthostatic  - PT/OT consulted  ??  Presumptive alcoholic hepatitis: Elevated transaminases on admission (AST 262, ALT 79), seemingly consistent with alcoholic hepatitis.  Madrey's discriminant function score less than 32, indicating no utility of corticosteroids at this time. Hepatitis panel non-reactive. LFT's improving  - continue monitor LFTs  - could consider naltrexone at discharge   ??  Hyponatremia: NA 128 on admission. Secondary to ongoing alcohol use with low solute intake. received 2.5 L of IV fluids in ED .Improved to 130.  Repeat sodium 129 this am  - continue to monitor  ??  Pancytopenia: Likely secondary to chronic alcohol use. Plts 62  - Monitor CBCs  ??  Hypokalemia, hypomagnesemia: Secondary to chronic alcohol use.  - given 40 PO KCl, and 2g IV mag on 2/1 and again 2/2  - F/u lytes daily  ??    Tobacco use disorder: Current everyday smoker, 1 pack/day x 30 years  -Nicotine patch ordered  ??  Daily  Checklist:  Diet: Regular  DVT ppx: Lovenox  Bowel regimen: Miralax PRN  Dispo: , will transition to floor status  ___________________________________________________________________    Subjective:  Pt had multiple questions for me.  He has access to MyChart, but says there are too many abbreviations and he can't make sense of what medications he is on and what is wrong with him.   I spent some time discussing his medications with him and going through recent test results (head CT, echo). Overnight, pt had a fall while attempting to get out of bed. He said he got up because when he has to use the toilet, he has to go pretty quickly, and while trying to get to the BR, he was incontinent, and slipped.   We discussed that given his last drink was  1/30, he should be out of the withdrawal period, and no longer needs to be in the step-down unit, and we can start thinking about discharge from the hospital. I explained to Mr Nicholas Rush that I was concerned that over the last 6 months he has done very little but lay on the couch or the bed, ambulating only to go to the BR. He has had multiple falls in the last several months,and I suspect he is very deconditioned and in need of significant physical rehab.  I don't think returning home in his current state would be safe.  I also discussed this with Nicholas Rush, his SO as well.  He desperately needs substance abuse treatment, but would need physical rehab in order to be independent in his care before he can consider substance abuse treatment.       Recent Results (from the past 24 hour(s))   CBC    Collection Time: 02/22/19  4:28 AM   Result Value Ref Range    WBC 4.4 (L) 4.5 - 11.0 10*9/L    RBC 2.96 (L) 4.50 - 5.90 10*12/L    HGB 11.3 (L) 13.5 - 17.5 g/dL    HCT 19.1 (L) 47.8 - 53.0 %    MCV 114.0 (H) 80.0 - 100.0 fL    MCH 38.0 (H) 26.0 - 34.0 pg    MCHC 33.3 31.0 - 37.0 g/dL    RDW 29.5 62.1 - 30.8 %    MPV 11.2 (H) 7.0 - 10.0 fL    Platelet 56 (L) 150 - 440 10*9/L   Magnesium Level    Collection Time: 02/22/19  4:28 AM   Result Value Ref Range    Magnesium 1.6 1.6 - 2.2 mg/dL   Comprehensive Metabolic Panel    Collection Time: 02/22/19  4:28 AM   Result Value Ref Range    Sodium 129 (L) 135 - 145 mmol/L    Potassium 3.6 3.5 - 5.0 mmol/L    Chloride 100 98 - 107 mmol/L    Anion Gap 4 (L) 7 - 15 mmol/L    CO2 25.0 22.0 - 30.0 mmol/L    BUN 8 7 - 21 mg/dL    Creatinine 6.57 (L) 0.70 - 1.30 mg/dL    BUN/Creatinine Ratio 12     EGFR CKD-EPI Non-African American, Male >90 >=60 mL/min/1.11m2    EGFR CKD-EPI African American, Male >90 >=60 mL/min/1.9m2    Glucose 108 70 - 179 mg/dL    Calcium 7.8 (L) 8.5 - 10.2 mg/dL    Albumin 2.8 (L) 3.5 - 5.0 g/dL    Total Protein 6.3 (L) 6.5 - 8.3 g/dL    Total Bilirubin 1.1 0.0 - 1.2 mg/dL  AST 72 (H) 19 - 55 U/L    ALT 46 <50 U/L    Alkaline Phosphatase 129 (H) 38 - 126 U/L     Labs/Studies:  Labs and Studies from the last 24hrs per EMR and Reviewed    Objective:  Temp:  [36.5 ??C (97.7 ??F)-36.9 ??C (98.4 ??F)] 36.8 ??C (98.2 ??F)  Heart Rate:  [63-102] 83  Resp:  [16-21] 20  BP: (115-152)/(74-100) 126/84  SpO2:  [95 %-100 %] 99 %    GEN: disheveled gentleman, lying in bed, in NAD, sheets wet from spilled soda  HEENT: EOMI, Sclera anicteric, MMM  CARDS: RRR, No M/R/G.  PULM: CTA B, no wheezes or crackles, no increased work of breathing  PSYCH: alert, oriented x3  ABD: soft, nondistended, nontender, BS normoactive  EXTREM: no LE edema  NEURO: grossly nonfocal, no nystagmus, minimal tremors,  moving all extremities

## 2019-02-22 NOTE — Unmapped (Signed)
Pt is alert and oriented but forgetful, climbed out of bed and fell (See previous note). Call bell within reach, bed low, wheels locked, slip resistant socks on, freq. Checks, bed alarm on, reminded to call for assistance. VSS WNL. Medicated for a headache with good results. CIWA q4, not scoring. In no apparent distress, will continue to monitor.

## 2019-02-22 NOTE — Unmapped (Signed)
[phone]    Patient's wife would like you to call her to help interpret her husband's test results.  She is frustrated that she does not understand what the tests mean and the hospital doctors have been too busy to return her calls.  I told her that unless the answers were very cut and dried you may not be able to give a complete interpretation of a complex patient currently under hospital care, but she requested that you call and explain what you can.  She is concerned because husband cannot yet walk, yet he says he will be discharged within the next day or two.

## 2019-02-23 LAB — HEMATOCRIT: Hematocrit:VFr:Pt:Bld:Qn:: 34.9 — ABNORMAL LOW

## 2019-02-23 LAB — COMPREHENSIVE METABOLIC PANEL
ALBUMIN: 2.8 g/dL — ABNORMAL LOW (ref 3.5–5.0)
ALKALINE PHOSPHATASE: 149 U/L — ABNORMAL HIGH (ref 38–126)
ALT (SGPT): 36 U/L (ref <50)
ANION GAP: 4 mmol/L — ABNORMAL LOW (ref 7–15)
BILIRUBIN TOTAL: 0.6 mg/dL (ref 0.0–1.2)
BLOOD UREA NITROGEN: 12 mg/dL (ref 7–21)
BUN / CREAT RATIO: 17
CALCIUM: 8.1 mg/dL — ABNORMAL LOW (ref 8.5–10.2)
CHLORIDE: 102 mmol/L (ref 98–107)
CO2: 24 mmol/L (ref 22.0–30.0)
CREATININE: 0.72 mg/dL (ref 0.70–1.30)
EGFR CKD-EPI AA MALE: >90 mL/min/{1.73_m2} (ref >=60)
EGFR CKD-EPI NON-AA MALE: >90 mL/min/{1.73_m2} (ref >=60)
GLUCOSE RANDOM: 101 mg/dL (ref 70–179)
POTASSIUM: 3.6 mmol/L (ref 3.5–5.0)
PROTEIN TOTAL: 6.3 g/dL — ABNORMAL LOW (ref 6.5–8.3)
SODIUM: 130 mmol/L — ABNORMAL LOW (ref 135–145)

## 2019-02-23 LAB — CBC
HEMATOCRIT: 34.9 % — ABNORMAL LOW (ref 41.0–53.0)
HEMOGLOBIN: 11.5 g/dL — ABNORMAL LOW (ref 13.5–17.5)
MEAN CORPUSCULAR HEMOGLOBIN CONC: 32.9 g/dL (ref 31.0–37.0)
MEAN CORPUSCULAR HEMOGLOBIN: 37.6 pg — ABNORMAL HIGH (ref 26.0–34.0)
MEAN CORPUSCULAR VOLUME: 114.3 fL — ABNORMAL HIGH (ref 80.0–100.0)
MEAN PLATELET VOLUME: 11.1 fL — ABNORMAL HIGH (ref 7.0–10.0)
RED CELL DISTRIBUTION WIDTH: 14.7 % (ref 12.0–15.0)
WBC ADJUSTED: 4.3 10*9/L — ABNORMAL LOW (ref 4.5–11.0)

## 2019-02-23 LAB — MAGNESIUM: Magnesium:MCnc:Pt:Ser/Plas:Qn:: 1.3 — ABNORMAL LOW

## 2019-02-23 LAB — BLOOD UREA NITROGEN: Urea nitrogen:MCnc:Pt:Ser/Plas:Qn:: 12

## 2019-02-23 NOTE — Unmapped (Signed)
Daily Progress Note    Assessment/Plan:    Principal Problem:    Alcohol withdrawal seizure (CMS-HCC)  Active Problems:    Severe alcohol use disorder (CMS-HCC)    Recurrent falls    Pancytopenia (CMS-HCC)  Resolved Problems:    * No resolved hospital problems. *                 Nicholas Rush is a 54 y.o. male who presented to [facility] with Alcohol withdrawal seizure (CMS-HCC).    Seizure secondary to alcohol withdrawal - Severe alcohol use disorder: Current everyday alcohol consumption, up to 1/5th of bourbon and 10 individual fireballs daily without known prior history of complicated alcohol withdrawals.  His presentation does seem consistent with a true seizure given reported loss of consciousness, tonic-clonic motions, and postictal period.  Patient variably reports decreased alcohol consumption over the past 3 weeks with recurrent falls/positive LOC/reported head trauma. Per wife, she has found him on the floor of the bathroom, incontinent of urine, and lights are on but nobody's home for several minutes. CT head w/o contrast to rule-out structural etiologies for seizure negative for acute intracranial processes.  Possible seizures at home, however, would need pt to get sober to distinguish between alcohol withdrawal seizure and true seizures. Continued on  CIWA protocol with scores remaining <10. Did have a witnessed fall overnight after getting out of bed on 2/3.  Did not hit his head. BP low at time of fall. Stopped clonidine given fall with orthostatic hypotension, see below  - Continue CIWA  With symptom triggered ativan protocol  -Thiamine/folate supplementation ordered  - CM to assist with resources for substance abuse     ??  Recurrent falls: Patient reports recurrent falls with gait instability, with evidence of positive loss of consciousness and reported head trauma.  His exam on admission was notable for nystagmus increasing suspicion for underlying Wernicke's. Concern for possible seizures at home given falls, loss of bladder control, and confusion post fall.  Had been on antihypertensives at one point, but were discontinued d/t orthostatic hypotension with drinking. Had a fall last night while trying to get up out of bed.    - s/p thiamine 500 mg IV x1 in ED   - Continuing high-dose thiamine supplementation with 500 mg IV every 8 hours   - will stop clonidine, started for significant hypertension during withdrawal period. Now orthostatic  - PT/OT consulted, recommended 5x's weekly high intensity, placed consult for PM&R  ??  Presumptive alcoholic hepatitis: Elevated transaminases on admission (AST 262, ALT 79), seemingly consistent with alcoholic hepatitis.  Madrey's discriminant function score less than 32, indicating no utility of corticosteroids at this time. Hepatitis panel non-reactive. LFT's trending down  - continue monitor LFTs  - could consider naltrexone at discharge   ??  Hyponatremia: NA 128 on admission. Secondary to ongoing alcohol use with low solute intake. received 2.5 L of IV fluids in ED .Improved to 130.  Repeat sodium 130 this am  - continue to monitor  ??  Pancytopenia: Likely secondary to chronic alcohol use. Plts 62  - Monitor CBCs  ??  Hypokalemia, hypomagnesemia: Secondary to chronic alcohol use.  given 40 PO KCl, and 2g IV mag on 2/1 and again 2/2. Potassium stable.  - given 3g IV mag today  - F/u lytes daily  ??    Tobacco use disorder: Current everyday smoker, 1 pack/day x 30 years  -Nicotine patch ordered  ??  Daily Checklist:  Diet:  Regular  DVT ppx: Lovenox  Bowel regimen: Miralax PRN  Dispo: , will transition to floor status  ___________________________________________________________________    Subjective:  Pt asleep for most of shift.  Per nurse, had diarrhea overnight, wanted imodium, and had questions regarding medications.  Have spoken to wife and pt multiple times regarding current medications.     Floor time 35 minutes, > 50% spent in counseling, coordination of care about the following issues:  ETOH abuse, substance abuse treatment, physical rehabilitation, as well as discussions with significant other, nursing and CM.      Recent Results (from the past 24 hour(s))   CBC    Collection Time: 02/23/19  4:20 AM   Result Value Ref Range    WBC 4.3 (L) 4.5 - 11.0 10*9/L    RBC 3.05 (L) 4.50 - 5.90 10*12/L    HGB 11.5 (L) 13.5 - 17.5 g/dL    HCT 08.6 (L) 57.8 - 53.0 %    MCV 114.3 (H) 80.0 - 100.0 fL    MCH 37.6 (H) 26.0 - 34.0 pg    MCHC 32.9 31.0 - 37.0 g/dL    RDW 46.9 62.9 - 52.8 %    MPV 11.1 (H) 7.0 - 10.0 fL    Platelet 72 (L) 150 - 440 10*9/L   Magnesium Level    Collection Time: 02/23/19  4:20 AM   Result Value Ref Range    Magnesium 1.3 (L) 1.6 - 2.2 mg/dL   Comprehensive Metabolic Panel    Collection Time: 02/23/19  4:20 AM   Result Value Ref Range    Sodium 130 (L) 135 - 145 mmol/L    Potassium 3.6 3.5 - 5.0 mmol/L    Chloride 102 98 - 107 mmol/L    Anion Gap 4 (L) 7 - 15 mmol/L    CO2 24.0 22.0 - 30.0 mmol/L    BUN 12 7 - 21 mg/dL    Creatinine 4.13 2.44 - 1.30 mg/dL    BUN/Creatinine Ratio 17     EGFR CKD-EPI Non-African American, Male >90 >=60 mL/min/1.73m2    EGFR CKD-EPI African American, Male >90 >=60 mL/min/1.16m2    Glucose 101 70 - 179 mg/dL    Calcium 8.1 (L) 8.5 - 10.2 mg/dL    Albumin 2.8 (L) 3.5 - 5.0 g/dL    Total Protein 6.3 (L) 6.5 - 8.3 g/dL    Total Bilirubin 0.6 0.0 - 1.2 mg/dL    AST 51 19 - 55 U/L    ALT 36 <50 U/L    Alkaline Phosphatase 149 (H) 38 - 126 U/L     Labs/Studies:  Labs and Studies from the last 24hrs per EMR and Reviewed    Objective:  Temp:  [36.3 ??C (97.3 ??F)-36.9 ??C (98.4 ??F)] 36.7 ??C (98.1 ??F)  Heart Rate:  [64-85] 69  Resp:  [16-18] 17  BP: (132-167)/(71-105) 167/92  SpO2:  [94 %-100 %] 100 %    GEN: disheveled gentleman, lying in bed, in NAD, asleep  HEENT: EOMI, Sclera anicteric, MMM  CARDS: RRR, No M/R/G.  PULM: CTA B, no wheezes or crackles, no increased work of breathing  PSYCH: alert, oriented x3  ABD: soft, nondistended, nontender, BS normoactive  EXTREM: no LE edema  NEURO: grossly nonfocal,

## 2019-02-23 NOTE — Unmapped (Signed)
""  Order was placed for a \""PIV by Venous Access Team (VAT)\"".  Patient was assessed at bedside for placement of a PIV. Mask and protective eyewear were donned. Access was obtained. Blood return noted.  Dressing intact and device well secured.  Flushed with normal saline.  Pt advised to inform RN of any s/s of discomfort at the PIV site.    Workup / Procedure Time:  15 minutes        RN was notified.       Thank you,     Fredderick Erb RN Venous Access Team""

## 2019-02-23 NOTE — Unmapped (Signed)
OCCUPATIONAL THERAPY  Evaluation (02/22/19 1454)    Patient Name:  Nicholas Rush       Medical Record Number: [id number]   Date of Birth: 06-08-1965  Sex: Male          OT Treatment Diagnosis:  decreased balance, decreased B LE strength, decreased FMC, impacting ADL performance/safety    Assessment  Problem List: Decreased strength, Decreased endurance, Decreased mobility, Decreased coordination, Impaired vision, Impaired balance, Decreased safety awareness, Fall Risk, Impaired ADLs  Assessment: Nicholas Rush is a 54 y.o. male with PMHx of HTN, anxiety, depressionm severe alcohol use diorder, GERd, psoriasis (on humira) who presents to [facility] with Alcohol withdrawal seizure (CMS-HCC). At baseline, patient was assist in ADLs,  IADLs, and  functional mobility. Patient is now limited by problem list as mentioned above, impacting ADL/functional mobility performance and safety. Pt requiring mod. A for functional transfers and min. Ax2 for fx mobility, decreased FMC and high falls risk, very motivated and participatory. Patient would benefit from skilled OT services 3-4 times per week while at [facility], recommend continued skilled OT services 5 times per week at a high intensity upon discharge. After review of the patient's occupational profile and history, assessment of occupational performance, clinical decision making, and development of POC, the patient presents as a moderate complexity case.     Today's Interventions: OT evaluation, bed mobility, functional transfers, functional mobility, self feeding, lower body dressing, grooming tasks, sitting tolerance and standing tolerance. Patient educated on role of OT, POC, safety, importance of RN/therapy assist with OOB mobility, benefits of early ADL engagement, fall prevention techniques (reducing clutter, removing throw rugs, adequate lighting, use of AD, seated ADL tasks), bed in chair, safety with BSC and nursing.     Activity Tolerance During Today's Session Patient tolerated treatment well    Plan  Planned Frequency of Treatment:  1-2x per day for: 3-4x week       Planned Interventions:  Adaptive equipment, ADL retraining, Balance activities, Bed mobility, Compensatory tech. training, Conservation, Education - Patient, Home exercise program, Functional mobility, Functional cognition, Endurance activities, Education - Family / caregiver, Neuromuscular re-education, Insurance account manager / Proximal stability, Range of motion, Safety education, Positioning, Wheelchair evaluation / modification, Visual / perceptual tasks, UE Strength / coordination exercise, Transfer training, Therapeutic exercise, Wheelchair training    Post-Discharge Occupational Therapy Recommendations:  OT Post Acute Discharge Recommendations: 5x weekly, Low intensity   OT DME Recommendations: Defer to post acute    GOALS:   Patient and Family Goals: I want to go to rehab    Long Term Goal #1: Pt will score 24/24 on AMPAC raw score in 8 weeks       Short Term:  Pt will complete BSC t/f with LRAD and CGA   Time Frame : 2 weeks  Pt will complete 2+ minutes of standing ADL with LRAD and SBA   Time Frame : 2 weeks  Pt will complete LB dressing with LRAD and CGA   Time Frame : 2 weeks                  Prognosis:  Good  Positive Indicators:  family support, motivation  Barriers to Discharge: Functional strength deficits, Endurance deficits, Gait instability, Impaired Balance, Inability to safely perform ADLS    Subjective  Current Status Pt rec'd supine in bed, left in bed in chair, bed alarm engaged, all needs met, call bell within reach, RNupdated  Prior Functional Status PTA. pt reports requiring assist  for ADLs/fx mobility/IADLs from family, often requiring assist for transfers in/out of bath tub and from low surfaces, limited to walking to/from bathroom and has had frequent falls at home. Pt reports decline since ~1 year ago but has been more recently getting worse.       Services patient receives: PT, OT Patient / Caregiver reports: I just never know when these legs are going to give out on me    Past Medical History:   Diagnosis Date   ??? Anxiety    ??? Depressive disorder    ??? Erectile dysfunction    ??? Esophageal reflux    ??? History of pyloric stenosis    ??? Hypertension    ??? Insomnia    ??? Psoriasis     Dermatologist - Dr. Deloria Lair in [facility]   ??? Seizure (CMS-HCC) 02/20/2019   ??? Tobacco use     1ppd    Social History     Tobacco Use   ??? Smoking status: Current Every Day Smoker     Packs/day: 1.00     Years: 30.00     Pack years: 30.00     Types: Cigarettes   ??? Smokeless tobacco: Never Used   Substance Use Topics   ??? Alcohol use: Yes     Alcohol/week: 42.0 standard drinks     Types: 42 Cans of beer per week     Comment: 6 pack every day      Past Surgical History:   Procedure Laterality Date   ??? PYLOROMYOTOMY     ??? WISDOM TOOTH EXTRACTION      Family History   Problem Relation Age of Onset   ??? Bipolar disorder Mother    ??? Depression Sister    ??? Alcohol abuse Sister    ??? Alcohol abuse Maternal Aunt    ??? Melanoma Neg Hx    ??? Basal cell carcinoma Neg Hx    ??? Squamous cell carcinoma Neg Hx         Hydroxyzine     Objective Findings  Precautions / Restrictions  Falls precautions    Weight Bearing  Non-applicable    Required Braces or Orthoses  Non-applicable    Communication Preference  Verbal    Pain  No c/o pain    Equipment / Environment  Vascular access (PIV, TLC, Port-a-cath, PICC), Patient not wearing mask for full session(Pt removing for ADls, wore for ~80% session, Ot wore n95 throughout)    Living Situation  Living Environment: Physiological scientist  Lives With: Family  Home Living: One level home, Ramped entrance, Garden tub, Standard height toilet     Cognition   Orientation Level:  Oriented x 4   Arousal/Alertness:  Appropriate responses to stimuli   Attention Span:  Appears intact   Memory:  Appears intact   Following Commands:  Follows all commands and directions without difficulty   Safety Judgment:  Decreased awareness of need for assistance, Decreased awareness of need for safety   Awareness of Errors:  Decreased awareness of need for assistance, Decreased awareness of need for safety, Assistance required to identify errors made, Assistance required to correct errors made, Decreased awareness of errors   Problem Solving:  Assistance required to identify errors made, Assistance required to generate solutions, Assistance required to implement solutions   Comments: Pt oriented x4 wtih good command follow, repeats history of frequent falls and LOC but denies any changes in cognition, pt received supine in bed with soda all over table/sheets and made no  effort to request assist or alert RN, Will benefit from f/u.    Vision / Perception        Vision: Wears glasses for reading only, Other, Cataracts     Comments: Ot completed brief visual screen, pt with intact visual fields, good divergence/convergence,  but decrased smooth pursuits/visual tracking past midline to R side, with noted nystagmus. Pt reports cataracts in R eye,    Hand Function  Hand Dominance: R  B gross grasp WFL, decreased FMC and L pinky hyper flexed at PIP    Skin Inspection  c/d/i    ROM / Strength/Coordination  UE ROM/ Strength/ Coordination: B UE ROM/Strength: appear WFL but shaky throughout and slightly less coordinated  LE ROM/ Strength/ Coordination: B LE ROM/StrengtH: ROM appear WFL but B Knee buckling noted and weakness, requiring assist for transfer    Sensation:  intact    Balance:  sitting: CGA; standing: min.A x2 with RW    Functional Mobility  Transfer Assistance Needed: Yes  Transfers - Needs Assistance: Mod assist, Verbal assist/cues required(sit <> standx2: mod. A with HHA vs. RW; side steps towards HOB; min. Ax 2 with RW)  Bed Mobility Assistance Needed: Yes  Bed Mobility - Needs Assistance: Physical assistance required, Verbal assist/cues required      ADLs  ADLs: Needs assistance with ADLs  ADLs - Needs Assistance: Feeding, Grooming, Bathing, Toileting, LB dressing, UB dressing  Feeding - Needs Assistance: Set Up Assist  Grooming - Needs Assistance: Physical assistance required, Verbal assist/cues required(EOB grooming (brush teeth) with CGA)  Bathing - Needs Assistance: Min assist, Verbal assist/cues required(anticipate bed level min. A)  Toileting - Needs Assistance: Mod assist, Verbal assist/cues required(anticipate mod. A with RW to [facility])  UB Dressing - Needs Assistance: Set Up Assist  LB Dressing - Needs Assistance: Min assist, Verbal assist/cues required      Vitals / Orthostatics  At Rest: HR 100, 02 98 on RA  With Activity: HR increased to ~130 with EOB/OOB activity, RN aware  Orthostatics: asymptomatic      Medical Staff Made Aware: Rn aware      Occupational Therapy Session Duration  OT Individual - Duration: 46         I attest that I have reviewed the above information.  Signed: Merril Abbe, OT  Ceasar Mons 02/22/2019

## 2019-02-23 NOTE — Unmapped (Addendum)
Pt alert/oriented x 4, VSS. Pt complained of pain, administered PRN pain medication as directed. Discussed plan of care with pt, including scheduled medications during this shift. Pt using call bell appropriately, and verbalized understanding to not get out of bed by himself. Pt reporting loose BM this shift, and was requesting anti-diarrheal medications. On-call provider Dholakia notified via page, and was also notified that pt wanted to speak to provider regarding scheduled medications and that pt wanted to know why he was not being given imodium for his loose BM. CIWA assessed q4hr per protocol. Bed alarm on, call bell within reach and bed in lowest position. Will continue to monitor.    Problem: Adult Inpatient Plan of Care  Goal: Plan of Care Review  Outcome: Progressing  Goal: Patient-Specific Goal (Individualization)  Outcome: Progressing  Goal: Absence of Hospital-Acquired Illness or Injury  Outcome: Progressing  Goal: Optimal Comfort and Wellbeing  Outcome: Progressing  Goal: Readiness for Transition of Care  Outcome: Progressing  Goal: Rounds/Family Conference  Outcome: Progressing     Problem: Fall Injury Risk  Goal: Absence of Fall and Fall-Related Injury  Outcome: Progressing     Problem: Hypertension Comorbidity  Goal: Blood Pressure in Desired Range  Outcome: Progressing     Problem: Self-Care Deficit  Goal: Improved Ability to Complete Activities of Daily Living  Outcome: Progressing

## 2019-02-23 NOTE — Unmapped (Signed)
Physical Medicine and Rehab  Consult Note    Requesting Attending Physician: Elesa Massed, ANP  Service Requesting Consult: [name] [facility])    ASSESSMENT / RECOMMENDATIONS:     Nicholas Rush is a 54 y.o. male with past medical history of HTN, anxiety, depression severe alcohol use diorder, GERd, psoriasis (on humira) who presents to [facility] with Alcohol withdrawal seizure (CMS-HCC).  His exam on admission was notable for nystagmus increasing suspicion for underlying Wernicke's. Concern for possible seizures at home given falls, loss of bladder control, and confusion post fall. The patient is seen in consultation for evaluation of rehabilitation needs.    Functional Impairment  Weakness, ataxia secondary to EtOH use.  Continue with CIWA protocol, vitamin supplementation,   Substance abuse counseling.   - Please continue to have patient work with PT and OT to maximize functional status with mobility and ADLs.   - may benefit from ST to evaluate cognitive function    Rehabilitation Plan of Care  Patient states he would like to stop drinking.  He is interested in substance abuse rehab.    - Patient has complex rehab, nursing, and medical needs and may become appropriate for Acute Inpatient Rehabilitation with regular therapy participation / demonstrated progress, completion of treatment plan and confirmation of disposition plan.  - Currently AIR beds are in short supply and he may progress to be able to go home or go to substance abuse rehab by the time a bed is available.   - Impatient substance abuse rehab may be most appropriate if available.    - Due to the shared treatment spaces inherent to inpatient rehabilitation, [facility] is now requiring a negative COVID-19 test prior to admission.  The patient had a negative COVID-19 test on 1/31.    In order for the patient to be considered for admission to [facility] inpatient rehab, the case manager must give the patient / family choice in post-acute discharge options AND if the patient desires [facility], place a referral order to [facility] inpatient rehab. The case manager may contact the Rehab Intake Coordinator with questions about acceptance to [facility],  bed availability and insurance authorization.     Thank you for this consult.  Please contact the PM&R consult pager [phone]) for questions regarding these recommendations.  For questions of bed availability at [facility], contact the Intake Office at [phone].    Darrick Grinder, MD      SUBJECTIVE:     Reason for Consult: Patient seen in consultation at the request of Elesa Massed, ANP for evaluation of rehabilitation needs and recommendations.    History of Present Illness: Nicholas Rush is a 54 y.o. male with a past medical history HTN, anxiety, depression severe alcohol use diorder, GERd, psoriasis (on humira) who presents to [facility] with Alcohol withdrawal seizure (CMS-HCC).  His exam on admission was notable for nystagmus increasing suspicion for underlying Wernicke's. Concern for possible seizures at home given falls, loss of bladder control, and confusion post fall  Today patient overall feels better. He is alert and orientated but has ataxia with finger to nose and with standing and taking a few steps. He is interested in alcohol rehab    Prior Functional Status:    Prior Functional Status: Reports his overall mobility has declined in the past year and half, most noticeably in the past 6 months. He has been ambulating short household distances without AD, however will ambulating ~70 feet to go  to the bathroom d/t his toilet in the master bathroom is too low and he cannot get off of it. He reports multiple falls d/t LEs giving out on him. Was independent with ADLs except required assist from girlfriend/fiance getting in/out of his tub; she drives and does the errands. He was previously working Holiday representative.    Current Functional Status: Therapy notes reviewed and analyzed     Activities of Daily Living:   Pt requiring mod. A for functional transfers and min. Ax2 for fx mobility, decreased FMC and high falls risk, very motivated and participatory.  ADLs: Needs assistance with ADLs  ADLs - Needs Assistance: Feeding, Grooming, Bathing, Toileting, LB dressing, UB dressing  Feeding - Needs Assistance: Set Up Assist  Grooming - Needs Assistance: Physical assistance required, Verbal assist/cues required(EOB grooming (brush teeth) with CGA)  Bathing - Needs Assistance: Min assist, Verbal assist/cues required(anticipate bed level min. A)  Toileting - Needs Assistance: Mod assist, Verbal assist/cues required(anticipate mod. A with RW to [facility])  UB Dressing - Needs Assistance: Set Up Assist  LB Dressing - Needs Assistance: Min assist, Verbal assist/cues required  Assessment  Problem List: Decreased strength, Decreased endurance, Decreased mobility, Decreased coordination, Impaired vision, Impaired balance, Decreased safety awareness, Fall Risk, Impaired ADLs  Assessment:   Today's Interventions:               Mobility:   Bed Mobility: Supine->sit with HOB flat with CGA with cues for sequencing, requiring extra time. Sit->supine with SBA.  Transfers: Sit <-> stand without AD with mod A and bilateral HHA, heavy posterior lean on his heels upon standing, requiring use of bed to steady himself. Additional sit <-> stand with RW with mod A but with improved quality, cues for UE placement and control with descent. Both reps pt demos LE shaking d/t instability.     Gait: Ambulated 3 feet laterally to [facility] with min A +2 and RW with cues for RW mgmt and sequencing; pt demos slow, unsteady gait, limited by fatigue and weakness but no gross LOB  PT Post Acute Discharge Recommendations: 5x weekly, High intensity    Cognition, Swallow, Speech:   Cognition / Swallow / Speech  Patient's Vision Adequate to Safely Complete Daily Activities: Yes  Patient's Judgement Adequate to Safely Complete Daily Activities: Yes  Patient's Memory Adequate to Safely Complete Daily Activities: Yes  Patient Able to Express Needs/Desires: Yes  Patient has speech problem: No             Assistive Devices: None    Precautions:  Safety Interventions  Safety Interventions: bed alarm, fall reduction program maintained, lighting adjusted for tasks/safety, low bed, nonskid shoes/slippers when out of bed  Seizure Precautions: side rails padded    Medical / Surgical History:   Past Medical History:   Diagnosis Date   ??? Anxiety    ??? Depressive disorder    ??? Erectile dysfunction    ??? Esophageal reflux    ??? History of pyloric stenosis    ??? Hypertension    ??? Insomnia    ??? Psoriasis     Dermatologist - Dr. Deloria Lair in [facility]   ??? Seizure (CMS-HCC) 02/20/2019   ??? Tobacco use     1ppd     Past Surgical History:   Procedure Laterality Date   ??? PYLOROMYOTOMY     ??? WISDOM TOOTH EXTRACTION          Social History:   Social History     Tobacco Use   ???  Smoking status: Current Every Day Smoker     Packs/day: 1.00     Years: 30.00     Pack years: 30.00     Types: Cigarettes   ??? Smokeless tobacco: Never Used   Substance Use Topics   ??? Alcohol use: Yes     Alcohol/week: 42.0 standard drinks     Types: 42 Cans of beer per week     Comment: 6 pack every day   ??? Drug use: No       Living Environment: Physiological scientist  Lives With: Family  Home Living: One level home, Ramped entrance, Garden tub, Standard height toilet    Family History: Reviewed  family history includes Alcohol abuse in his maternal aunt and sister; Bipolar disorder in his mother; Depression in his sister.    Allergies:   Hydroxyzine    Medications:   Scheduled   ??? enoxaparin (LOVENOX) syringe 40 mg Q24H [facility]   ??? magnesium sulfate in D5W 1 gram/100 mL infusion 1 g Q1H   ??? nicotine (NICODERM CQ) 21 mg/24 hr patch 1 patch Daily   ??? pantoprazole (PROTONIX) EC tablet 20 mg Daily     PRN acetaminophen, 650 mg, Q4H PRN  LORazepam, 2 mg, Q4H PRN    Or  LORazepam, 4 mg, Q4H PRN    Or  LORazepam, 4 mg, Q30 Min PRN    Or  LORazepam, 4 mg, Q2H PRN  melatonin, 6 mg, Nightly PRN  ondansetron, 8 mg, Q8H PRN    Or  ondansetron, 4 mg, Q8H PRN      Continuous Infusions      Review of Systems:    Full 10 systems reviewed and neg, unless noted in HPI    OBJECTIVE:     Vitals:  Temp:  [36.3 ??C (97.3 ??F)-36.9 ??C (98.4 ??F)] 36.7 ??C (98.1 ??F)  Heart Rate:  [64-85] 69  Resp:  [16-18] 17  BP: (132-167)/(71-105) 167/92  MAP (mmHg):  [81-119] 112  SpO2:  [94 %-100 %] 100 %    Physical Exam:    GEN: Lying in bed in NAD. Disheveled  HEENT: Atraumatic. Normocephalic. Moist mucous membranes. Trachea midline.  RESP: NWOB on RA.  CV: Reg but tachy, no pedal edema  GI: abd soft, NTND  GU: no Foley   SKIN: no rashes or ecchymoses on exposed skin  MSK:  no notable contractures, no visible swelling or erythema over joints, joints NTTP  NEURO:  Mental Status: A&Ox3, attention intact, speech fluid and coherent, follows commands well and answers questions appropriately  Cranial Nerve: visual acuity intact, no visual fields deficits, pupils equal, EOMI, facial sensation bilaterally, no facial droop, hearing grossly intact b/l, shoulder shrug full and equal, tongue protrudes midline  Sensory: BUE and BLE sensation intact to light touch  Motor:     Strength in Bilat UE and LE grossly intact  Tone: within normal limits, no spasticity noted  Reflexes: no clonus  Cerebellar:  FNF with dysmetria,  PSYCH: mood euthymic, affect appropriate, thought process logical     Labs and Diagnostic Studies: Reviewed   CBC - Results in Past 2 Days  Result Component Current Result   WBC 4.3 (L) (02/23/2019)   RBC 3.05 (L) (02/23/2019)   HGB 11.5 (L) (02/23/2019)   HCT 34.9 (L) (02/23/2019)   MCV 114.3 (H) (02/23/2019)   MCH 37.6 (H) (02/23/2019)   MCHC 32.9 (02/23/2019)   MPV 11.1 (H) (02/23/2019)   Platelet 72 (L) (02/23/2019)     BMP -  Results in Past 2 Days  Result Component Current Result   Sodium 130 (L) (02/23/2019)   Potassium 3.6 (02/23/2019)   Chloride 102 (02/23/2019)   CO2 24.0 (02/23/2019)   BUN 12 (02/23/2019)   Creatinine 0.72 (02/23/2019) EST.GFR (MDRD) Not in Time Range   Glucose 101 (02/23/2019)     Coagulation -   No results found for requested labs within last 2 days.     Cardiac markers -   No results found for requested labs within last 2 days.     LFT's - Results in Past 2 Days  Result Component Current Result   Albumin 2.8 (L) (02/23/2019)   ALT 36 (02/23/2019)   AST 51 (02/23/2019)   Alkaline Phosphatase 149 (H) (02/23/2019)   Total Bilirubin 0.6 (02/23/2019)   Bilirubin, Direct Not in Time Range    Not in Time Range       Radiology Results: Reviewed   Diffuse cerebral volume loss with prominence of CSF-containing spaces. There are a few scattered hypodense foci within the periventricular and deep white matter.  These are nonspecific but commonly associated with small vessel ischemic changes, not significantly changed from prior. There is no midline shift. No mass lesion. There is no evidence of acute infarct. No acute intracranial hemorrhage. No fractures are evident. Polypoid mucosal of the maxillary sinuses. Calcifications of the bilateral carotid siphons.    For coding purposes:   - This patient was seen by the provider Darrick Grinder, MD).  - This encounter should be coded as a an inpatient consultation.

## 2019-02-23 NOTE — Unmapped (Signed)
Called patient to review results and hospital course

## 2019-02-23 NOTE — Unmapped (Signed)
Problem: Adult Inpatient Plan of Care  Goal: Plan of Care Review  Outcome: Progressing  Goal: Patient-Specific Goal (Individualization)  Outcome: Progressing  Goal: Absence of Hospital-Acquired Illness or Injury  Outcome: Progressing  Goal: Optimal Comfort and Wellbeing  Outcome: Progressing  Goal: Readiness for Transition of Care  Outcome: Progressing  Goal: Rounds/Family Conference  Outcome: Progressing     Problem: Fall Injury Risk  Goal: Absence of Fall and Fall-Related Injury  Outcome: Progressing     Problem: Hypertension Comorbidity  Goal: Blood Pressure in Desired Range  Outcome: Progressing     Problem: Self-Care Deficit  Goal: Improved Ability to Complete Activities of Daily Living  Outcome: Progressing

## 2019-02-23 NOTE — Unmapped (Signed)
PHYSICAL THERAPY  Evaluation(Co-eval with Deneise Lever, OT for safe mobility) (02/22/19 1453)     Patient Name:  Nicholas Rush       Medical Record Number: [id number]   Date of Birth: February 09, 1965  Sex: Male            Treatment Diagnosis: Impaired mobility s/p Alcohol withdrawal seizure    ASSESSMENT  Problem List: Impaired balance, Fall Risk, Decreased endurance, Decreased strength, Decreased mobility, Gait deviation, Decreased coordination, Postural Weakness, Core weakness     Assessment : Nicholas Rush is a 54 y.o. male referred to physical therapy with PMHx of HTN, anxiety, depression severe alcohol use diorder, GERd, psoriasis (on humira) who presents to [facility] with Alcohol withdrawal seizure (CMS-HCC). He presents with below baseline mobility with deficits in functional mobility, gait stability, and activity tolerance. He is limited by weakness and instability, requiring mod A for transfers and min A +2 and RW for ambulation; increased shaking and instability with prolonged standing d/t fatigue. After a review of the personal factors, comorbidities, clinical presentation, and examination of the number of affected body systems, the patient presents as a moderate complexity case. He will benefit from skilled PT in the acute setting, as well as post-acute PT of 5x/wk (high intensity) to address his remaining deficits.     Today's Interventions: Bed mobility, transfers, gait, sitting/standing balance/tolerance, pt education on: PT role and POC, fall risks, activity pacing, up with assist, progressive mobility, use of AD for stability, continued UE/LE ROM/exercises                          PLAN  Planned Frequency of Treatment:  1-2x per day for: 3-4x week      Planned Interventions: Balance activities, Education - Patient, Diaphragmatic / Pursed-lip breathing, Education - Family / caregiver, Home exercise program, Therapeutic exercise, Stair training, Gait training, Functional mobility, Endurance activities, Therapeutic activity, Transfer training, Neuromuscular re-education, Postural re-education, Self-care / Home training    Post-Discharge Physical Therapy Recommendations:  5x weekly, High intensity    PT DME Recommendations: Defer to post acute(recommend RW if d/cs home)           Goals:   Patient and Family Goals: To be able to walk more with less falls; return to PLOF    Long Term Goal #1: The patient will score 20/20 on the AMPAC in 6 weeks       SHORT GOAL #1: The patient will perform all bed mobility independently from flat bed              Time Frame : 2 weeks  SHORT GOAL #2: The patient will safely complete all functional transfers with LRAD and mod I              Time Frame : 2 weeks  SHORT GOAL #3: The patient will safely ambulate 200 feet with LRAD and mod I              Time Frame : 2 weeks                                        Prognosis:  Good  Positive Indicators: PLOF, motivation, support at home  Barriers to Discharge: Functional strength deficits, Endurance deficits, Gait instability, Impaired Balance, Inability to safely perform ADLS    SUBJECTIVE  Patient reports:  I've been falling a lot  Current Functional Status: Patient in bed upon arrival with bed alarm on, in bed in chair position with bed alarm on, RN aware of pt positioning  Services patient receives: PT, OT  Prior Functional Status: Reports his overall mobility has declined in the past year and half, most noticeably in the past 6 months. He has been ambulating short household distances without AD, however will ambulating ~70 feet to go to the bathroom d/t his toilet in the master bathroom is too low and he cannot get off of it. He reports multiple falls d/t LEs giving out on him. Was independent with ADLs except required assist from girlfriend/fiance getting in/out of his tub; she drives and does the errands. He was previously working Holiday representative.  Equipment available at home: None     Past Medical History:   Diagnosis Date ??? Anxiety    ??? Depressive disorder    ??? Erectile dysfunction    ??? Esophageal reflux    ??? History of pyloric stenosis    ??? Hypertension    ??? Insomnia    ??? Psoriasis     Dermatologist - Dr. Deloria Lair in [facility]   ??? Seizure (CMS-HCC) 02/20/2019   ??? Tobacco use     1ppd    Social History     Tobacco Use   ??? Smoking status: Current Every Day Smoker     Packs/day: 1.00     Years: 30.00     Pack years: 30.00     Types: Cigarettes   ??? Smokeless tobacco: Never Used   Substance Use Topics   ??? Alcohol use: Yes     Alcohol/week: 42.0 standard drinks     Types: 42 Cans of beer per week     Comment: 6 pack every day      Past Surgical History:   Procedure Laterality Date   ??? PYLOROMYOTOMY     ??? WISDOM TOOTH EXTRACTION      Family History   Problem Relation Age of Onset   ??? Bipolar disorder Mother    ??? Depression Sister    ??? Alcohol abuse Sister    ??? Alcohol abuse Maternal Aunt    ??? Melanoma Neg Hx    ??? Basal cell carcinoma Neg Hx    ??? Squamous cell carcinoma Neg Hx         Allergies: Hydroxyzine                Objective Findings  Precautions / Restrictions  Precautions: Falls precautions  Weight Bearing Status: Non-applicable  Required Braces or Orthoses: Non-applicable    Communication Preference: Verbal   Pain Comments: Denies pain  Medical Tests / Procedures: Imaging and labs reviewed  Equipment / Environment: Vascular access (PIV, TLC, Port-a-cath, PICC), Patient not wearing mask for full session, Telemetry    At Rest: HR 100, BP 126/84, SpO2 100% RA  With Activity: HR max 135 with standing, RN aware  Orthostatics: No c/o dizziness or lightheadedness with mobility/transfers       Living Situation  Living Environment: Physiological scientist  Lives With: Family  Home Living: One level home, Ramped entrance, Garden tub, Standard height toilet     Cognition: A&Ox4; able to follow commands appropriately  Visual / Perception Status: Smooth pursuit impaired with looking to his right with noted dysmetria however pt asymptomatic  Skin Inspection: Skin tear on R hand    UE ROM: WFL, L pinkie flexed and tight d/t previous surgery  UE Strength:  WFL  LE ROM: WFL  LE Strength: WFL                       Sensation: Report L hand numbness from previous finger surgery ~6 weeks ago (pinkie finger)         Other: UE tremors with fatigue  Bed Mobility: Supine->sit with HOB flat with CGA with cues for sequencing, requiring extra time. Sit->supine with SBA.  Transfers: Sit <-> stand without AD with mod A and bilateral HHA, heavy posterior lean on his heels upon standing, requiring use of bed to steady himself. Additional sit <-> stand with RW with mod A but with improved quality, cues for UE placement and control with descent. Both reps pt demos LE shaking d/t instability.   Gait  Level of Assistance: Minimal assist, patient does 75% or more  Assistive Device: Front wheel walker  Distance Ambulated (ft): 3 ft  Gait: Ambulated 3 feet laterally to [facility] with min A +2 and RW with cues for RW mgmt and sequencing; pt demos slow, unsteady gait, limited by fatigue and weakness but no gross LOB  Stairs: n/a      Endurance: Good; limited by instability, fatigue and weakness    Physical Therapy Session Duration  PT Co-Treatment - Duration: 46    Medical Staff Made Aware: RN Teodoro Spray    I attest that I have reviewed the above information.  Signed: Viviana Simpler, PT  Filed 02/22/2019

## 2019-02-24 LAB — COMPREHENSIVE METABOLIC PANEL
ALBUMIN: 2.8 g/dL — ABNORMAL LOW (ref 3.5–5.0)
ALKALINE PHOSPHATASE: 133 U/L — ABNORMAL HIGH (ref 38–126)
ALT (SGPT): 33 U/L (ref <50)
AST (SGOT): 45 U/L (ref 19–55)
BILIRUBIN TOTAL: 0.5 mg/dL (ref 0.0–1.2)
BLOOD UREA NITROGEN: 9 mg/dL (ref 7–21)
BUN / CREAT RATIO: 14
CALCIUM: 8.2 mg/dL — ABNORMAL LOW (ref 8.5–10.2)
CHLORIDE: 101 mmol/L (ref 98–107)
CO2: 28 mmol/L (ref 22.0–30.0)
CREATININE: 0.65 mg/dL — ABNORMAL LOW (ref 0.70–1.30)
EGFR CKD-EPI AA MALE: >90 mL/min/{1.73_m2} (ref >=60)
EGFR CKD-EPI NON-AA MALE: >90 mL/min/{1.73_m2} (ref >=60)
POTASSIUM: 3.6 mmol/L (ref 3.5–5.0)
PROTEIN TOTAL: 6 g/dL — ABNORMAL LOW (ref 6.5–8.3)
SODIUM: 131 mmol/L — ABNORMAL LOW (ref 135–145)

## 2019-02-24 LAB — CBC
HEMOGLOBIN: 11.4 g/dL — ABNORMAL LOW (ref 13.5–17.5)
MEAN CORPUSCULAR HEMOGLOBIN CONC: 32.8 g/dL (ref 31.0–37.0)
MEAN CORPUSCULAR HEMOGLOBIN: 37.4 pg — ABNORMAL HIGH (ref 26.0–34.0)
MEAN CORPUSCULAR VOLUME: 114 fL — ABNORMAL HIGH (ref 80.0–100.0)
MEAN PLATELET VOLUME: 10.2 fL — ABNORMAL HIGH (ref 7.0–10.0)
PLATELET COUNT: 93 10*9/L — ABNORMAL LOW (ref 150–440)
RED BLOOD CELL COUNT: 3.05 10*12/L — ABNORMAL LOW (ref 4.50–5.90)
RED CELL DISTRIBUTION WIDTH: 14.8 % (ref 12.0–15.0)
WBC ADJUSTED: 3.9 10*9/L — ABNORMAL LOW (ref 4.5–11.0)

## 2019-02-24 LAB — MEAN PLATELET VOLUME: Platelet mean volume:EntVol:Pt:Bld:Qn:Automated count: 10.2 — ABNORMAL HIGH

## 2019-02-24 LAB — BUN / CREAT RATIO: Urea nitrogen/Creatinine:MRto:Pt:Ser/Plas:Qn:: 14

## 2019-02-24 LAB — MAGNESIUM: Magnesium:MCnc:Pt:Ser/Plas:Qn:: 1.6

## 2019-02-24 NOTE — Unmapped (Signed)
VSS.  Patient resting comfortably tonight.  Good appetite.  He is awaiting rehab.  Receiving librium.  A and O X4 .    Problem: Adult Inpatient Plan of Care  Goal: Plan of Care Review  Outcome: Progressing  Goal: Patient-Specific Goal (Individualization)  Outcome: Progressing  Goal: Absence of Hospital-Acquired Illness or Injury  Outcome: Progressing  Goal: Optimal Comfort and Wellbeing  Outcome: Progressing  Goal: Readiness for Transition of Care  Outcome: Progressing  Goal: Rounds/Family Conference  Outcome: Progressing     Problem: Fall Injury Risk  Goal: Absence of Fall and Fall-Related Injury  Outcome: Progressing     Problem: Hypertension Comorbidity  Goal: Blood Pressure in Desired Range  Outcome: Progressing     Problem: Self-Care Deficit  Goal: Improved Ability to Complete Activities of Daily Living  Outcome: Progressing

## 2019-02-24 NOTE — Unmapped (Signed)
Daily Progress Note    Assessment/Plan:    Principal Problem:    Alcohol withdrawal seizure (CMS-HCC)  Active Problems:    Severe alcohol use disorder (CMS-HCC)    Recurrent falls    Pancytopenia (CMS-HCC)  Resolved Problems:    * No resolved hospital problems. *                 Nicholas Rush is a 54 y.o. male who presented to [facility] with Alcohol withdrawal seizure (CMS-HCC).    Seizure secondary to alcohol withdrawal - Severe alcohol use disorder: Current everyday alcohol consumption, up to 1/5th of bourbon and 10 individual fireballs daily without known prior history of complicated alcohol withdrawals.  His presentation does seem consistent with a true seizure given reported loss of consciousness, tonic-clonic motions, and postictal period.  Patient variably reports decreased alcohol consumption over the past 3 weeks with recurrent falls/positive LOC/reported head trauma. Per wife, she has found him on the floor of the bathroom, incontinent of urine, and lights are on but nobody's home for several minutes. CT head w/o contrast to rule-out structural etiologies for seizure negative for acute intracranial processes.  Possible seizures at home, however, would need pt to get sober to distinguish between alcohol withdrawal seizure and true seizures.  Did have a witnessed fall overnight after getting out of bed on 2/3.  Did not hit his head. BP low at time of fall. Stopped clonidine given fall with orthostatic hypotension, see below.  CIWA scores have remained below 10 throughout hospital course.    - will stop CIWA monitoring  - will continue to wean librium, now once daily  - continue thiamine, folate, MVI  - CM to assist with resources for substance abuse     ??  Recurrent falls: Patient reports recurrent falls with gait instability, with evidence of positive loss of consciousness and reported head trauma.  His exam on admission was notable for nystagmus increasing suspicion for underlying Wernicke's. Concern for possible seizures at home given falls, loss of bladder control, and confusion post fall.  Had been on antihypertensives at one point, but were discontinued d/t orthostatic hypotension with drinking. Had a fall 2/3  while trying to get up out of bed. Given 5 days of high dose thiamine for possible Wernicke's.    - will stop clonidine, started for significant hypertension during withdrawal period. Now orthostatic  - PT/OT consulted, recommended 5x's weekly high intensity, placed consult for PM&R  ??  Presumptive alcoholic hepatitis: Elevated transaminases on admission (AST 262, ALT 79), seemingly consistent with alcoholic hepatitis.  Madrey's discriminant function score less than 32, indicating no utility of corticosteroids at this time. Hepatitis panel non-reactive. LFT's trending down, now normalized.   - could consider naltrexone at discharge   ??  Hyponatremia: NA 128 on admission. Secondary to ongoing alcohol use with low solute intake. received 2.5 L of IV fluids in ED .Improved to 130.  Repeat sodium 131 this am  - continue to monitor  ??  Pancytopenia: Likely secondary to chronic alcohol use. Plts 93  - Monitor CBCs  ??  Hypokalemia, hypomagnesemia: Secondary to chronic alcohol use.  given 40 PO KCl, and 2g IV mag on 2/1 and again 2/2. Potassium stable. Given 3g IV mag on 2/4  - F/u lytes daily  ??    Tobacco use disorder: Current everyday smoker, 1 pack/day x 30 years  -Nicotine patch ordered  ??  Daily Checklist:  Diet: Regular  DVT ppx: Lovenox  Bowel regimen: Miralax PRN  Dispo: , will transition to floor status  ___________________________________________________________________    Subjective:  Pt without complaints. Pt asked to be discharged home. He expressed interest in doing OP substance abuse treatment but he is not interested in pursuing IP substance abuse treatment, and he would prefer to be discharged and be set up with [facility] PT/OT.    I discussed with the pt my ongoing concerns regarding his high risk for falls. He is still quite unsteady on his feet and orthostatic. I discussed his falls may be secondary to orthostatic hypotension, as well as alcoholic neuropathy.  I told pt that I would honor his wishes, and discharge him but that he would not be able to be seen in person for substance abuse treatment until he was independent in his care.     I discussed my concerns with pt's SO, [name] as well. She stated she had the next week off of work and that her 37 year old son and daughter also could help with his mobility. I provided her with information regarding follow up and next steps.      Informed by CM, that pt has decided to stay and will continue therapy while pursuing placement.     Floor time 35 minutes, > 50% spent in counseling, coordination of care about the following issues:  ETOH abuse, substance abuse treatment, physical rehabilitation, as well as discussions with significant other, nursing and CM.      Recent Results (from the past 24 hour(s))   CBC    Collection Time: 02/24/19  5:24 AM   Result Value Ref Range    WBC 3.9 (L) 4.5 - 11.0 10*9/L    RBC 3.05 (L) 4.50 - 5.90 10*12/L    HGB 11.4 (L) 13.5 - 17.5 g/dL    HCT 16.1 (L) 09.6 - 53.0 %    MCV 114.0 (H) 80.0 - 100.0 fL    MCH 37.4 (H) 26.0 - 34.0 pg    MCHC 32.8 31.0 - 37.0 g/dL    RDW 04.5 40.9 - 81.1 %    MPV 10.2 (H) 7.0 - 10.0 fL    Platelet 93 (L) 150 - 440 10*9/L   Magnesium Level    Collection Time: 02/24/19  5:24 AM   Result Value Ref Range    Magnesium 1.6 1.6 - 2.2 mg/dL   Comprehensive Metabolic Panel    Collection Time: 02/24/19  5:24 AM   Result Value Ref Range    Sodium 131 (L) 135 - 145 mmol/L    Potassium 3.6 3.5 - 5.0 mmol/L    Chloride 101 98 - 107 mmol/L    Anion Gap 2 (L) 7 - 15 mmol/L    CO2 28.0 22.0 - 30.0 mmol/L    BUN 9 7 - 21 mg/dL    Creatinine 9.14 (L) 0.70 - 1.30 mg/dL    BUN/Creatinine Ratio 14     EGFR CKD-EPI Non-African American, Male >90 >=60 mL/min/1.59m2    EGFR CKD-EPI African American, Male >90 >=60 mL/min/1.32m2    Glucose 107 70 - 179 mg/dL    Calcium 8.2 (L) 8.5 - 10.2 mg/dL    Albumin 2.8 (L) 3.5 - 5.0 g/dL    Total Protein 6.0 (L) 6.5 - 8.3 g/dL    Total Bilirubin 0.5 0.0 - 1.2 mg/dL    AST 45 19 - 55 U/L    ALT 33 <50 U/L    Alkaline Phosphatase 133 (H) 38 - 126 U/L  Labs/Studies:  Labs and Studies from the last 24hrs per EMR and Reviewed    Objective:  Temp:  [36.4 ??C (97.5 ??F)-36.9 ??C (98.4 ??F)] 36.9 ??C (98.4 ??F)  Heart Rate:  [75-123] 123  Resp:  [16-18] 17  BP: (122-167)/(82-101) 141/83  SpO2:  [87 %-100 %] 99 %    GEN: disheveled gentleman, lying in bed, in NAD  HEENT: EOMI, Sclera anicteric, MMM  CARDS: RRR, No M/R/G.  PULM: CTA B, no wheezes or crackles, no increased work of breathing  PSYCH: alert, oriented x3  ABD: soft, nondistended, nontender, BS normoactive  EXTREM: no LE edema  NEURO: grossly nonfocal, mild tremor

## 2019-02-24 NOTE — Unmapped (Addendum)
Seizure secondary to alcohol withdrawal - Severe alcohol use disorder:??Current everyday alcohol consumption, up to??1/5th??of bourbon and 10 individual fireballs daily without known prior history of complicated alcohol withdrawals. ??His presentation does seem consistent with a true seizure given reported loss of consciousness, tonic-clonic motions, and postictal period.????Patient variably reports decreased alcohol consumption over the past??3 weeks with??recurrent falls/positive LOC/reported head trauma.??Per wife, she has found him on the floor of the bathroom, incontinent of urine, and lights are on but nobody's home for several minutes. CT head w/o contrast to rule-out structural etiologies for seizure negative for acute intracranial processes.  Possible seizures at home, however, would need pt to get sober to distinguish between alcohol withdrawal seizure and true seizures.  Did have a witnessed fall overnight after getting out of bed on 2/3.  Did not hit his head. BP low at time of fall. Stopped clonidine given fall with orthostatic hypotension, see below.  CIWA scores have remained below 10 throughout hospital course.  We started pt on librium taper, and provided thiamine folate and MVI to prevent metabolic encephalopathy.  He completed the Librium taper without clinical signs or symptoms of withdrawal.  CM provided substance abuse resources for pt.   ??  Recurrent falls:??Patient reports recurrent falls with gait instability, with evidence of positive loss of consciousness and reported head trauma. ??His exam on admission was notable for nystagmus increasing suspicion for underlying Wernicke's. Concern for possible seizures at home given falls, loss of bladder control, and confusion post fall.  Had been on antihypertensives at one point, but were discontinued d/t orthostatic hypotension with drinking. Had a fall 2/3  while trying to get up out of bed. Given 5 days of high dose thiamine for possible Wernicke's, and transitioned to thiamine 100mg  daily.  Pt had been started on clonidine for significant hypertension during withdrawal period, but this was stopped after pt had orthostasis.  PT/OT consulted, who initially recommended rehab. However, patient progressed over hospitalization to outpatient PT/OT.  ??  Presumptive??alcoholic hepatitis:??Elevated transaminases on admission (AST 262, ALT 79),??seemingly consistent with alcoholic hepatitis. ??Madrey's discriminant function score less than 32, indicating no utility of corticosteroids at this time. Hepatitis panel non-reactive. LFT's trended down, now normalized.   ??  Hyponatremia: NA 128 on admission.??Secondary to ongoing alcohol use with low solute intake. received 2.5 L of IV fluids in ED. Improved to 130.  Repeat sodium remained stable.   ??  Pancytopenia:??Likely secondary to chronic alcohol use.??Monitored daily while here, platelets slowly trending up.

## 2019-02-24 NOTE — Unmapped (Signed)
A&Ox4, VSS, Tachy (110s-120s) when moving around.  CIWA order discontinued.  No s/s distress.  Pt has tremors, unstable on feet.   Bed alarm on.  Pt reports no pain.  Good appetite.  Transferred to 1 Mem via transport.  Plan to stay at [facility] for rehab.      Problem: Adult Inpatient Plan of Care  Goal: Plan of Care Review  Outcome: Progressing  Goal: Patient-Specific Goal (Individualization)  Outcome: Progressing  Goal: Absence of Hospital-Acquired Illness or Injury  Outcome: Progressing  Goal: Optimal Comfort and Wellbeing  Outcome: Progressing  Goal: Readiness for Transition of Care  Outcome: Progressing  Goal: Rounds/Family Conference  Outcome: Progressing     Problem: Fall Injury Risk  Goal: Absence of Fall and Fall-Related Injury  Outcome: Progressing     Problem: Hypertension Comorbidity  Goal: Blood Pressure in Desired Range  Outcome: Progressing     Problem: Self-Care Deficit  Goal: Improved Ability to Complete Activities of Daily Living  Outcome: Progressing

## 2019-02-25 LAB — COMPREHENSIVE METABOLIC PANEL
ALBUMIN: 3.1 g/dL — ABNORMAL LOW (ref 3.5–5.0)
ALKALINE PHOSPHATASE: 143 U/L — ABNORMAL HIGH (ref 38–126)
ALT (SGPT): 33 U/L (ref <50)
ANION GAP: 4 mmol/L — ABNORMAL LOW (ref 7–15)
AST (SGOT): 40 U/L (ref 19–55)
BILIRUBIN TOTAL: 0.6 mg/dL (ref 0.0–1.2)
BLOOD UREA NITROGEN: 10 mg/dL (ref 7–21)
BUN / CREAT RATIO: 15
CALCIUM: 8.7 mg/dL (ref 8.5–10.2)
CHLORIDE: 101 mmol/L (ref 98–107)
CO2: 30 mmol/L (ref 22.0–30.0)
CREATININE: 0.66 mg/dL — ABNORMAL LOW (ref 0.70–1.30)
EGFR CKD-EPI AA MALE: >90 mL/min/{1.73_m2} (ref >=60)
EGFR CKD-EPI NON-AA MALE: >90 mL/min/{1.73_m2} (ref >=60)
GLUCOSE RANDOM: 111 mg/dL (ref 70–179)
POTASSIUM: 3 mmol/L — ABNORMAL LOW (ref 3.5–5.0)
PROTEIN TOTAL: 6.6 g/dL (ref 6.5–8.3)
SODIUM: 135 mmol/L (ref 135–145)

## 2019-02-25 LAB — CBC
HEMATOCRIT: 36.1 % — ABNORMAL LOW (ref 41.0–53.0)
HEMOGLOBIN: 11.9 g/dL — ABNORMAL LOW (ref 13.5–17.5)
MEAN CORPUSCULAR HEMOGLOBIN: 37.8 pg — ABNORMAL HIGH (ref 26.0–34.0)
MEAN CORPUSCULAR VOLUME: 115 fL — ABNORMAL HIGH (ref 80.0–100.0)
MEAN PLATELET VOLUME: 11 fL — ABNORMAL HIGH (ref 7.0–10.0)
PLATELET COUNT: 112 10*9/L — ABNORMAL LOW (ref 150–440)
RED BLOOD CELL COUNT: 3.14 10*12/L — ABNORMAL LOW (ref 4.50–5.90)
RED CELL DISTRIBUTION WIDTH: 14.6 % (ref 12.0–15.0)
WBC ADJUSTED: 3.9 10*9/L — ABNORMAL LOW (ref 4.5–11.0)

## 2019-02-25 LAB — MAGNESIUM: Magnesium:MCnc:Pt:Ser/Plas:Qn:: 1.2 — ABNORMAL LOW

## 2019-02-25 LAB — MEAN CORPUSCULAR HEMOGLOBIN CONC: Erythrocyte mean corpuscular hemoglobin concentration:MCnc:Pt:RBC:Qn:Automated count: 32.9

## 2019-02-25 LAB — BILIRUBIN TOTAL: Bilirubin:MCnc:Pt:Ser/Plas:Qn:: 0.6

## 2019-02-25 NOTE — Unmapped (Signed)
Pt hypertensive, otherwise VSS. Pt alert & oriented. Tremors noted & pt very unsteady on feet requiring assistance. Pt otherwise tolerating regular diet. Several voids. BM x1. See flowsheets for further details.    Problem: Adult Inpatient Plan of Care  Goal: Plan of Care Review  Outcome: Ongoing - Unchanged  Goal: Patient-Specific Goal (Individualization)  Outcome: Ongoing - Unchanged  Goal: Absence of Hospital-Acquired Illness or Injury  Outcome: Ongoing - Unchanged  Goal: Optimal Comfort and Wellbeing  Outcome: Ongoing - Unchanged  Goal: Readiness for Transition of Care  Outcome: Ongoing - Unchanged  Goal: Rounds/Family Conference  Outcome: Ongoing - Unchanged

## 2019-02-25 NOTE — Unmapped (Signed)
Daily Progress Note    Assessment/Plan:    Principal Problem:    Alcohol withdrawal seizure (CMS-HCC)  Active Problems:    Severe alcohol use disorder (CMS-HCC)    Recurrent falls    Pancytopenia (CMS-HCC)  Resolved Problems:    * No resolved hospital problems. *                 Nicholas Rush is a 54 y.o. male who presented to [facility] with Alcohol withdrawal seizure (CMS-HCC).    Seizure secondary to alcohol withdrawal - Severe alcohol use disorder: Prior to admission, reported daily alcohol consumption, up to 1/5th of bourbon and 10 individual fireballs daily. No known prior history of complicated alcohol withdrawals.  His presentation does seem consistent with a true seizure given reported loss of consciousness, tonic-clonic motions, and postictal period. He has tolerated detoxification with Librium which has now been tapered off.  - Discontinue Librium  - No indication for CIWA given low scores  - Continue MVI, folate, thiamine  - CM to assist with resources for substance abuse  - Consider naltrexone at discharge as LFTs have normalized      Recurrent falls: Patient reports recurrent falls with gait instability, with evidence of positive loss of consciousness and reported head trauma.  His exam on admission was notable for nystagmus increasing suspicion for underlying Wernicke's. Concern for possible seizures at home given falls, loss of bladder control, and confusion post fall.  Had been on antihypertensives at one point, but were discontinued d/t orthostatic hypotension with drinking. Had a fall 2/3  while trying to get up out of bed. Given 5 days of high dose thiamine for possible Wernicke's.   - PT/OT following  - Referral made to AIR given recommendation for 5x high intensity, no bed at this time    Hyponatremia: Resolved. Likely 2/2 EtOH and poor solute intake.  ??  Pancytopenia: Likely secondary to chronic alcohol use. Counts stable.   ??  Hypokalemia, hypomagnesemia: Requiring multiple PRN doses for replacement. Re-ordered this AM, repeat BMP in AM. ??    Tobacco use disorder: Current everyday smoker, 1 pack/day x 30 years  -Nicotine patch ordered  ??  Daily Checklist:  Diet: Regular  DVT ppx: Lovenox  Bowel regimen: Miralax PRN  Dispo: Floor status. Medically appropriate for discharge once rehab bed available.   ___________________________________________________________________    Subjective:  Patient is doing well this AM and has no complaints. We reviewed the plan for rehab and he remains agreeable to stay until bed available. States he slept fine and appetite is good. Denies tremor or other symptoms of withdrawal.     Labs/Studies:  Labs and Studies from the last 24hrs per EMR and Reviewed    Objective:  Temp:  [36.9 ??C (98.4 ??F)-37.1 ??C (98.8 ??F)] 37.1 ??C (98.8 ??F)  Heart Rate:  [77-123] 81  Resp:  [17-18] 18  BP: (141-172)/(83-96) 172/96  SpO2:  [97 %-99 %] 97 %    Gen: Sitting up in bed eating breakfast. Awake and alert. NAD. Non-toxic.  HEENT: EOMI, sclera anicteric.   Resp: Normal WOB. Equal air movement bilaterally. CTAB  CV: RRR, normal s1s2, no m/g/r.   Abd: Soft, NTND, normoactive BS.  Ext: WWP. No edema.  Neuro: No tremor. Strength grossly intact in BUE and BLE

## 2019-02-25 NOTE — Unmapped (Addendum)
[facility]   [address]   [place], [place] [address]   Main Phone: [phone]     Referrals are accepted Monday - Friday between 8 a.m. and 4:30 p.m., and appointments for the individual assessments will be made at that time.     We understand that every patient presents with a unique history and personal situation. Our therapists are prepared to address the individual needs of patients by matching specific treatment plans and services accordingly. We offer a variety of evidence-based models to aid you in your journey.    Alcohol and Substance Use Disorder Treatment  ?? An individual, comprehensive assessment/evaluation  ?? Outpatient therapy groups  ?? Individualized treatment and treatment goals  ?? Assistance with Psychopharmacological/Medication Management Referrals  ?? Emphasized and encouraged family involvement  ?? Couples Therapy  ?? Adolescent Treatment and psychiatry consultation available  ?? Trauma-informed treatment  ?? Cognitive Behavioral Therapy  ?? Mindfulness  ?? Dialectical Behavioral Therapy  ?? Motivational Enhancement Therapy  ?? Reality Therapy    ________________________________________    Your doctor recommends outpatient PT and OT as a follow up to your hospital stay. One option for you is listed below. You may call them to schedule an appointment:    [facility]  [address]  [place], [place] [address]  [phone]

## 2019-02-26 LAB — CBC
HEMATOCRIT: 34.8 % — ABNORMAL LOW (ref 41.0–53.0)
HEMOGLOBIN: 11.6 g/dL — ABNORMAL LOW (ref 13.5–17.5)
MEAN CORPUSCULAR HEMOGLOBIN CONC: 33.3 g/dL (ref 31.0–37.0)
MEAN CORPUSCULAR HEMOGLOBIN: 38.5 pg — ABNORMAL HIGH (ref 26.0–34.0)
MEAN CORPUSCULAR VOLUME: 115.3 fL — ABNORMAL HIGH (ref 80.0–100.0)
RED BLOOD CELL COUNT: 3.02 10*12/L — ABNORMAL LOW (ref 4.50–5.90)
RED CELL DISTRIBUTION WIDTH: 14.4 % (ref 12.0–15.0)
WBC ADJUSTED: 5.8 10*9/L (ref 4.5–11.0)

## 2019-02-26 LAB — COMPREHENSIVE METABOLIC PANEL
ALBUMIN: 3 g/dL — ABNORMAL LOW (ref 3.5–5.0)
ALKALINE PHOSPHATASE: 134 U/L — ABNORMAL HIGH (ref 38–126)
ALT (SGPT): 28 U/L (ref <50)
AST (SGOT): 33 U/L (ref 19–55)
BILIRUBIN TOTAL: 0.4 mg/dL (ref 0.0–1.2)
BLOOD UREA NITROGEN: 12 mg/dL (ref 7–21)
BUN / CREAT RATIO: 12
CALCIUM: 8.5 mg/dL (ref 8.5–10.2)
CHLORIDE: 100 mmol/L (ref 98–107)
CO2: 26 mmol/L (ref 22.0–30.0)
CREATININE: 0.99 mg/dL (ref 0.70–1.30)
EGFR CKD-EPI AA MALE: >90 mL/min/{1.73_m2} (ref >=60)
EGFR CKD-EPI NON-AA MALE: 86 mL/min/{1.73_m2} (ref >=60)
GLUCOSE RANDOM: 107 mg/dL (ref 70–179)
POTASSIUM: 3.7 mmol/L (ref 3.5–5.0)
PROTEIN TOTAL: 6.2 g/dL — ABNORMAL LOW (ref 6.5–8.3)
SODIUM: 130 mmol/L — ABNORMAL LOW (ref 135–145)

## 2019-02-26 LAB — CALCIUM: Calcium:MCnc:Pt:Ser/Plas:Qn:: 8.5

## 2019-02-26 LAB — HEMATOCRIT: Hematocrit:VFr:Pt:Bld:Qn:: 34.8 — ABNORMAL LOW

## 2019-02-26 LAB — MAGNESIUM: Magnesium:MCnc:Pt:Ser/Plas:Qn:: 1.4 — ABNORMAL LOW

## 2019-02-26 NOTE — Unmapped (Signed)
Pt awaiting rehab or home health. IV mag replaced this shift. IV fluid bolus given this shift. Xray to shoulder done this shift. Pt is alert and oriented, voiding, tolerating po. Remains free from falls. Ambulating on unit this shift. VSS. Will continue to monitor.  Problem: Adult Inpatient Plan of Care  Goal: Plan of Care Review  Outcome: Progressing  Goal: Patient-Specific Goal (Individualization)  Outcome: Progressing  Goal: Absence of Hospital-Acquired Illness or Injury  Outcome: Progressing  Goal: Optimal Comfort and Wellbeing  Outcome: Progressing  Goal: Readiness for Transition of Care  Outcome: Progressing  Goal: Rounds/Family Conference  Outcome: Progressing     Problem: Fall Injury Risk  Goal: Absence of Fall and Fall-Related Injury  Outcome: Progressing     Problem: Hypertension Comorbidity  Goal: Blood Pressure in Desired Range  Outcome: Progressing

## 2019-02-26 NOTE — Unmapped (Signed)
Patient afebrile. VSS. Alert and oriented x 4, calm and cooperative. Denies pain. Mild tremors noted. OOB with PT and nursing, ambulating well with standby assistance. Magnesium and Potassium supplements given as ordered. Patient updated on plan of care.       Problem: Adult Inpatient Plan of Care  Goal: Plan of Care Review  Outcome: Progressing  Goal: Patient-Specific Goal (Individualization)  Outcome: Progressing  Goal: Absence of Hospital-Acquired Illness or Injury  Outcome: Progressing  Goal: Optimal Comfort and Wellbeing  Outcome: Progressing  Goal: Readiness for Transition of Care  Outcome: Progressing  Goal: Rounds/Family Conference  Outcome: Progressing     Problem: Fall Injury Risk  Goal: Absence of Fall and Fall-Related Injury  Outcome: Progressing     Problem: Hypertension Comorbidity  Goal: Blood Pressure in Desired Range  Outcome: Progressing     Problem: Self-Care Deficit  Goal: Improved Ability to Complete Activities of Daily Living  Outcome: Progressing

## 2019-02-26 NOTE — Unmapped (Signed)
Order was placed for a PIV by [facility] Access Team (VAT).  Patient was assessed at bedside for placement of a PIV. Mask and protective eyewear were donned. Access was obtained. Blood return noted.  Dressing intact and device well secured.  Flushed with normal saline.  Pt advised to inform RN of any s/s of discomfort at the PIV site.    Workup / Procedure Time:  15 minutes      Victorino Dike RN was notified.       Thank you,     Seymour Bars RN [facility] Access Team

## 2019-02-26 NOTE — Unmapped (Signed)
Daily Progress Note    Assessment/Plan:    Principal Problem:    Alcohol withdrawal seizure (CMS-HCC)  Active Problems:    Severe alcohol use disorder (CMS-HCC)    Recurrent falls    Pancytopenia (CMS-HCC)  Resolved Problems:    * No resolved hospital problems. *                 Nicholas Rush is a 54 y.o. male who presented to [facility] with Alcohol withdrawal seizure (CMS-HCC).    Seizure secondary to alcohol withdrawal (Resolved) - Severe alcohol use disorder: Prior to admission, reported daily alcohol consumption, up to 1/5th of bourbon and 10 individual fireballs daily. No known prior history of complicated alcohol withdrawals.  His presentation does seem consistent with a true seizure given reported loss of consciousness, tonic-clonic motions, and postictal period. He has tolerated detoxification with Librium which has now been tapered off.  - Discontinue Librium  - Continue MVI, folate, thiamine  - CM to assist with resources for substance abuse  - Consider naltrexone at discharge as LFTs have normalized      Recurrent falls: Patient reports recurrent falls with gait instability, with evidence of positive loss of consciousness and reported head trauma.  His exam on admission was notable for nystagmus increasing suspicion for underlying Wernicke's. Concern for possible seizures at home given falls, loss of bladder control, and confusion post fall.  Had been on antihypertensives at one point, but were discontinued d/t orthostatic hypotension with drinking. Had a fall 2/3  while trying to get up out of bed. Given 5 days of high dose thiamine for possible Wernicke's.   - PT/OT following, and he is progressing well  - Referral made to [facility] given recommendation for 5x high intensity, no bed at this time  - Reporting limited ROM and pain in L shoulder s/p fall. Will get shoulder xr to ensure no injury    Hyponatremia: Resolved. Likely 2/2 EtOH and poor solute intake.  ??  Pancytopenia: Likely secondary to chronic alcohol use. Counts stable.   ??  Hypokalemia, hypomagnesemia: Continue repletion PRN    Tobacco use disorder: Current everyday smoker, 1 pack/day x 30 years  - Continue nicotine patch  ??  Daily Checklist:  Diet: Regular  DVT ppx: Lovenox  Bowel regimen: Miralax PRN  Dispo: Floor status. Medically appropriate for discharge once rehab bed available.   ___________________________________________________________________    Subjective:  Overall doing well today. He walked around the unit 4 times yesterday with the walker. He still has a hard time standing up from the bed, but overall feels more steady and stable on his feet. He is pleased with the progress he is making. He took a shower yesterday. Appetite is good. No nausea/vomiting. Called his girlfriend [name] by phone and updated her on plan of care. She is really pleased to hear his progress. Of note, she will be out of town after this weekend.     Labs/Studies:  Labs and Studies from the last 24hrs per EMR and Reviewed    Objective:  Temp:  [36.4 ??C (97.5 ??F)-37.2 ??C (99 ??F)] 37.2 ??C (99 ??F)  Heart Rate:  [85-107] 92  Resp:  [16-18] 16  BP: (142-176)/(101-121) 142/101  SpO2:  [97 %-99 %] 97 %    Gen: Sitting up in bed.. Awake and alert. NAD. Non-toxic.  HEENT: EOMI, sclera anicteric.   Resp: Normal WOB. Equal air movement bilaterally. CTAB  CV: RRR, normal s1s2, no m/g/r.   Abd:  Soft, NTND, normoactive BS.  Ext: WWP. No edema.  MSK: L shoulder with limited forward flexion (to ~ 100 degrees) with painful passive and active ROM. No pinpoint tenderness to palpation.

## 2019-02-26 NOTE — Unmapped (Signed)
Pt A&Ox4. VSS. No ASD noted. Administered prn Tylenol and prn Melatonin with noted benefit. Up ad lib to bathroom to void. Tolerated solid meal. Pt's sister called and noted that pt and his girlfriend had C-diff about a year ago and wonders if pt should be retested. Informed typically only test if having symptoms. Also informed that she is not on pt's contact list so could not give any pt information to her. Pt denies loose,watery stools currently and states he had a totally normal BM today. Bed in low, call bell within reach [facility].     Problem: Adult Inpatient Plan of Care  Goal: Plan of Care Review  Outcome: Progressing  Goal: Patient-Specific Goal (Individualization)  Outcome: Progressing  Goal: Absence of Hospital-Acquired Illness or Injury  Outcome: Progressing  Goal: Optimal Comfort and Wellbeing  Outcome: Progressing  Goal: Readiness for Transition of Care  Outcome: Progressing  Goal: Rounds/Family Conference  Outcome: Progressing     Problem: Fall Injury Risk  Goal: Absence of Fall and Fall-Related Injury  Outcome: Progressing     Problem: Hypertension Comorbidity  Goal: Blood Pressure in Desired Range  Outcome: Progressing     Problem: Self-Care Deficit  Goal: Improved Ability to Complete Activities of Daily Living  Outcome: Progressing

## 2019-02-27 LAB — COMPREHENSIVE METABOLIC PANEL
ALBUMIN: 3.5 g/dL (ref 3.5–5.0)
ALKALINE PHOSPHATASE: 135 U/L — ABNORMAL HIGH (ref 38–126)
ALT (SGPT): 30 U/L (ref <50)
AST (SGOT): 35 U/L (ref 19–55)
BILIRUBIN TOTAL: 0.5 mg/dL (ref 0.0–1.2)
BLOOD UREA NITROGEN: 11 mg/dL (ref 7–21)
BUN / CREAT RATIO: 14
CALCIUM: 8.9 mg/dL (ref 8.5–10.2)
CHLORIDE: 99 mmol/L (ref 98–107)
CO2: 30 mmol/L (ref 22.0–30.0)
CREATININE: 0.79 mg/dL (ref 0.70–1.30)
EGFR CKD-EPI AA MALE: >90 mL/min/{1.73_m2} (ref >=60)
EGFR CKD-EPI NON-AA MALE: >90 mL/min/{1.73_m2} (ref >=60)
GLUCOSE RANDOM: 95 mg/dL (ref 70–179)
POTASSIUM: 3.9 mmol/L (ref 3.5–5.0)
PROTEIN TOTAL: 7.1 g/dL (ref 6.5–8.3)
SODIUM: 134 mmol/L — ABNORMAL LOW (ref 135–145)

## 2019-02-27 LAB — CBC
HEMATOCRIT: 36.9 % — ABNORMAL LOW (ref 41.0–53.0)
HEMOGLOBIN: 12.1 g/dL — ABNORMAL LOW (ref 13.5–17.5)
MEAN CORPUSCULAR HEMOGLOBIN: 38 pg — ABNORMAL HIGH (ref 26.0–34.0)
MEAN CORPUSCULAR VOLUME: 115.5 fL — ABNORMAL HIGH (ref 80.0–100.0)
PLATELET COUNT: 191 10*9/L (ref 150–440)
RED BLOOD CELL COUNT: 3.19 10*12/L — ABNORMAL LOW (ref 4.50–5.90)
RED CELL DISTRIBUTION WIDTH: 14.6 % (ref 12.0–15.0)
WBC ADJUSTED: 5.7 10*9/L (ref 4.5–11.0)

## 2019-02-27 LAB — BLOOD UREA NITROGEN: Urea nitrogen:MCnc:Pt:Ser/Plas:Qn:: 11

## 2019-02-27 LAB — MAGNESIUM: Magnesium:MCnc:Pt:Ser/Plas:Qn:: 1.5 — ABNORMAL LOW

## 2019-02-27 LAB — WBC ADJUSTED: Leukocytes:NCnc:Pt:Bld:Qn:: 5.7

## 2019-02-27 NOTE — Unmapped (Signed)
Took over care for [place] at 1600 today. VSS on RA, afebrile. PIV SL, dressing changed for leakage, now C/D/I. Pt. Did not complain of pain or nausea. Sitting in chair, encouraging safe mobilization around the room and to call for help. Pt. Eating and drinking this afternoon. Will continue to monitor.     Problem: Adult Inpatient Plan of Care  Goal: Plan of Care Review  Outcome: Ongoing - Unchanged  Goal: Patient-Specific Goal (Individualization)  Outcome: Ongoing - Unchanged  Goal: Absence of Hospital-Acquired Illness or Injury  Outcome: Ongoing - Unchanged  Goal: Optimal Comfort and Wellbeing  Outcome: Ongoing - Unchanged  Goal: Readiness for Transition of Care  Outcome: Ongoing - Unchanged  Goal: Rounds/Family Conference  Outcome: Ongoing - Unchanged     Problem: Fall Injury Risk  Goal: Absence of Fall and Fall-Related Injury  Outcome: Ongoing - Unchanged     Problem: Hypertension Comorbidity  Goal: Blood Pressure in Desired Range  Outcome: Ongoing - Unchanged     Problem: Self-Care Deficit  Goal: Improved Ability to Complete Activities of Daily Living  Outcome: Ongoing - Unchanged

## 2019-02-27 NOTE — Unmapped (Signed)
Pt remained afebrile, VSS. PIV SL. No complaints of pain. Pt remains free of falls, reinforced safety/calling RN for assistance when transferring. Will continue to monitor.     Problem: Adult Inpatient Plan of Care  Goal: Plan of Care Review  Outcome: Ongoing - Unchanged  Goal: Patient-Specific Goal (Individualization)  Outcome: Ongoing - Unchanged  Goal: Absence of Hospital-Acquired Illness or Injury  Outcome: Ongoing - Unchanged  Goal: Optimal Comfort and Wellbeing  Outcome: Ongoing - Unchanged  Goal: Readiness for Transition of Care  Outcome: Ongoing - Unchanged  Goal: Rounds/Family Conference  Outcome: Ongoing - Unchanged

## 2019-02-27 NOTE — Unmapped (Signed)
Daily Progress Note    Assessment/Plan:    Principal Problem:    Alcohol withdrawal seizure (CMS-HCC)  Active Problems:    Severe alcohol use disorder (CMS-HCC)    Recurrent falls    Pancytopenia (CMS-HCC)  Resolved Problems:    * No resolved hospital problems. *                 Nicholas Rush is a 54 y.o. male who presented to [facility] with Alcohol withdrawal seizure (CMS-HCC).    Seizure secondary to alcohol withdrawal (Resolved) - Severe alcohol use disorder: Prior to admission, reported daily alcohol consumption, up to 1/5th of bourbon and 10 individual fireballs daily. No known prior history of complicated alcohol withdrawals.  His presentation does seem consistent with a true seizure given reported loss of consciousness, tonic-clonic motions, and postictal period. He has tolerated detoxification with Librium which has now been tapered off.  - Continue MVI, folate, thiamine  - Consider naltrexone at discharge as LFTs have normalized       Recurrent falls: Patient reports recurrent falls with gait instability, with evidence of positive loss of consciousness and reported head trauma.  His exam on admission was notable for nystagmus increasing suspicion for underlying Wernicke's. Concern for possible seizures at home given falls, loss of bladder control, and confusion post fall.  Had been on antihypertensives at one point, but were discontinued d/t orthostatic hypotension with drinking. Had a fall 2/3  while trying to get up out of bed. Given 5 days of high dose thiamine for possible Wernicke's.   - PT/OT following  - Referral made to [facility] given recommendation for 5x high intensity. However, unclear if he will be accepted. SW also looking into SNF. Patient is agreeable to going to rehab.     Hyponatremia: Resolved. Likely 2/2 EtOH and poor solute intake.  ??  Pancytopenia: Likely secondary to chronic alcohol use. Counts stable.   ??  Hypokalemia, hypomagnesemia: Continue repletion PRN    Tobacco use disorder: Current everyday smoker, 1 pack/day x 30 years  - Continue nicotine patch  ??  Daily Checklist:  Diet: Regular  DVT ppx: Lovenox  Bowel regimen: Miralax PRN  Dispo: Floor status. Medically appropriate for discharge, awaiting rehab at [facility] vs SNF.   ___________________________________________________________________    Subjective:  Patient is doing well today but is just feeling tired. He stayed up late watching the Super Bowl and then a movie last night. He has no complaints today. He agrees he still needs rehab as he has significant trouble trying to stand up from seated. He knows he will be stronger and safer at home after some therapy. Updated girlfriend [name] by phone.     Labs/Studies:  Labs and Studies from the last 24hrs per EMR and Reviewed    Objective:  Temp:  [36.5 ??C (97.7 ??F)-36.7 ??C (98.1 ??F)] 36.6 ??C (97.9 ??F)  Heart Rate:  [86-88] 88  Resp:  [16-18] 18  BP: (141-176)/(97-106) 160/106  SpO2:  [97 %-100 %] 100 %    Gen: Lying flat in bed. Awake and alert. NAD. Non-toxic. No tremor.  HEENT: EOMI, sclera anicteric.   Resp: Normal WOB. Equal air movement bilaterally. CTAB  CV: RRR, normal s1s2, no m/g/r.   Abd: Soft, NTND, normoactive BS.  Ext: WWP. No edema.

## 2019-02-27 NOTE — Unmapped (Signed)
Acute Inpatient Rehab referral has been received and patient is currently under review.     Please call admissions office with any pertinent updates. Thank you.      Janit Pagan, OTR/L   [facility]  Inpatient Coordinator  Office: [phone]    12:36 PM 02/27/2019

## 2019-02-28 LAB — COMPREHENSIVE METABOLIC PANEL
ALBUMIN: 3.1 g/dL — ABNORMAL LOW (ref 3.5–5.0)
ALKALINE PHOSPHATASE: 119 U/L (ref 38–126)
ALT (SGPT): 25 U/L (ref <50)
ANION GAP: 6 mmol/L — ABNORMAL LOW (ref 7–15)
AST (SGOT): 28 U/L (ref 19–55)
BILIRUBIN TOTAL: 0.4 mg/dL (ref 0.0–1.2)
BLOOD UREA NITROGEN: 10 mg/dL (ref 7–21)
BUN / CREAT RATIO: 15
CALCIUM: 8.7 mg/dL (ref 8.5–10.2)
CHLORIDE: 96 mmol/L — ABNORMAL LOW (ref 98–107)
CO2: 29 mmol/L (ref 22.0–30.0)
CREATININE: 0.68 mg/dL — ABNORMAL LOW (ref 0.70–1.30)
EGFR CKD-EPI AA MALE: >90 mL/min/{1.73_m2} (ref >=60)
EGFR CKD-EPI NON-AA MALE: >90 mL/min/{1.73_m2} (ref >=60)
GLUCOSE RANDOM: 113 mg/dL (ref 70–179)
PROTEIN TOTAL: 6.4 g/dL — ABNORMAL LOW (ref 6.5–8.3)
SODIUM: 131 mmol/L — ABNORMAL LOW (ref 135–145)

## 2019-02-28 LAB — CBC
HEMATOCRIT: 33.7 % — ABNORMAL LOW (ref 41.0–53.0)
HEMOGLOBIN: 11.2 g/dL — ABNORMAL LOW (ref 13.5–17.5)
MEAN CORPUSCULAR HEMOGLOBIN CONC: 33.4 g/dL (ref 31.0–37.0)
MEAN CORPUSCULAR HEMOGLOBIN: 38 pg — ABNORMAL HIGH (ref 26.0–34.0)
MEAN CORPUSCULAR VOLUME: 114 fL — ABNORMAL HIGH (ref 80.0–100.0)
MEAN PLATELET VOLUME: 9.7 fL (ref 7.0–10.0)
PLATELET COUNT: 228 10*9/L (ref 150–440)
RED CELL DISTRIBUTION WIDTH: 14.5 % (ref 12.0–15.0)
WBC ADJUSTED: 5.3 10*9/L (ref 4.5–11.0)

## 2019-02-28 LAB — BUN / CREAT RATIO: Urea nitrogen/Creatinine:MRto:Pt:Ser/Plas:Qn:: 15

## 2019-02-28 LAB — MAGNESIUM: Magnesium:MCnc:Pt:Ser/Plas:Qn:: 1.2 — ABNORMAL LOW

## 2019-02-28 LAB — PLATELET COUNT: Platelets:NCnc:Pt:Bld:Qn:Automated count: 228

## 2019-02-28 MED ORDER — MAGNESIUM OXIDE 400 MG (241.3 MG MAGNESIUM) TABLET
ORAL_TABLET | Freq: Every day | ORAL | 0 refills | 120.00000 days | Status: CP
Start: 2019-02-28 — End: ?
  Filled 2019-02-28: qty 120, 120d supply, fill #0

## 2019-02-28 MED ORDER — OMEPRAZOLE 20 MG CAPSULE,DELAYED RELEASE
ORAL_CAPSULE | 1 refills | 0 days | Status: CP
Start: 2019-02-28 — End: ?

## 2019-02-28 MED FILL — MAGNESIUM OXIDE 400 MG (241.3 MG MAGNESIUM) TABLET: 120 days supply | Qty: 120 | Fill #0 | Status: AC

## 2019-02-28 MED FILL — FOLIC ACID 1 MG TABLET: 30 days supply | Qty: 30 | Fill #0 | Status: AC

## 2019-02-28 MED FILL — NICOTINE 21 MG/24 HR DAILY TRANSDERMAL PATCH: 28 days supply | Qty: 28 | Fill #0 | Status: AC

## 2019-02-28 MED FILL — THERA-M 9 MG IRON-400 MCG TABLET: ORAL | 130 days supply | Qty: 130 | Fill #0

## 2019-02-28 MED FILL — THERA-M 9 MG IRON-400 MCG TABLET: 130 days supply | Qty: 130 | Fill #0 | Status: AC

## 2019-02-28 MED FILL — THIAMINE HCL (VITAMIN B1) 100 MG TABLET: 100 days supply | Qty: 100 | Fill #0 | Status: AC

## 2019-02-28 NOTE — Unmapped (Signed)
Nicholas Rush has remained afebrile this shift, all other VSS. He continues on all medications as ordered, tolerating well. Eating and drinking well, voiding appropriately, no c/o pain. PIV c/d/i, remains SL'd. No family at bedside this shift.         Problem: Adult Inpatient Plan of Care  Goal: Plan of Care Review  Outcome: Ongoing - Unchanged  Goal: Patient-Specific Goal (Individualization)  Outcome: Ongoing - Unchanged  Goal: Absence of Hospital-Acquired Illness or Injury  Outcome: Ongoing - Unchanged  Goal: Optimal Comfort and Wellbeing  Outcome: Ongoing - Unchanged  Goal: Readiness for Transition of Care  Outcome: Ongoing - Unchanged  Goal: Rounds/Family Conference  Outcome: Ongoing - Unchanged     Problem: Fall Injury Risk  Goal: Absence of Fall and Fall-Related Injury  Outcome: Ongoing - Unchanged     Problem: Hypertension Comorbidity  Goal: Blood Pressure in Desired Range  Outcome: Ongoing - Unchanged     Problem: Self-Care Deficit  Goal: Improved Ability to Complete Activities of Daily Living  Outcome: Ongoing - Unchanged

## 2019-02-28 NOTE — Unmapped (Signed)
Physician Discharge Summary [facility]  [facility] OBSERVATION [facility]  [address]  [place] [address]  Dept: [phone]  Loc: [phone]     Identifying Information:   Nicholas Rush  Jul 19, 1965  [id number]    Primary Care Physician: Blake Divine, MD     Code Status: Full Code    Admit Date: 02/19/2019    Discharge Date: 02/28/2019     Discharge To: Home    Discharge Service: [facility] -  Hospitalist (MED H TEAM 3)     Discharge Attending Physician: Kathryne Hitch, MD    Discharge Diagnoses:  Principal Problem:    Alcohol withdrawal seizure (CMS-HCC) POA: Yes  Active Problems:    Severe alcohol use disorder (CMS-HCC) POA: Yes    Recurrent falls POA: Not Applicable    Pancytopenia (CMS-HCC) POA: Yes  Resolved Problems:    * No resolved hospital problems. *      Outpatient Provider Follow Up Issues:   [ ]  Follow-up improvement in mobility  [ ]  Ongoing substance abuse counseling  [ ]  Repeat BMP, Mg     Hospital Course:   Seizure secondary to alcohol withdrawal - Severe alcohol use disorder:??Current everyday alcohol consumption, up to??1/5th??of bourbon and 10 individual fireballs daily without known prior history of complicated alcohol withdrawals. ??His presentation does seem consistent with a true seizure given reported loss of consciousness, tonic-clonic motions, and postictal period.????Patient variably reports decreased alcohol consumption over the past??3 weeks with??recurrent falls/positive LOC/reported head trauma.??Per wife, she has found him on the floor of the bathroom, incontinent of urine, and lights are on but nobody's home for several minutes. CT head w/o contrast to rule-out structural etiologies for seizure negative for acute intracranial processes.  Possible seizures at home, however, would need pt to get sober to distinguish between alcohol withdrawal seizure and true seizures.  Did have a witnessed fall overnight after getting out of bed on 2/3.  Did not hit his head. BP low at time of fall. Stopped clonidine given fall with orthostatic hypotension, see below.  CIWA scores have remained below 10 throughout hospital course.  We started pt on librium taper, and provided thiamine folate and MVI to prevent metabolic encephalopathy.  He completed the Librium taper without clinical signs or symptoms of withdrawal.  CM provided substance abuse resources for pt.   ??  Recurrent falls:??Patient reports recurrent falls with gait instability, with evidence of positive loss of consciousness and reported head trauma. ??His exam on admission was notable for nystagmus increasing suspicion for underlying Wernicke's. Concern for possible seizures at home given falls, loss of bladder control, and confusion post fall.  Had been on antihypertensives at one point, but were discontinued d/t orthostatic hypotension with drinking. Had a fall 2/3  while trying to get up out of bed. Given 5 days of high dose thiamine for possible Wernicke's, and transitioned to thiamine 100mg  daily.  Pt had been started on clonidine for significant hypertension during withdrawal period, but this was stopped after pt had orthostasis.  PT/OT consulted, who initially recommended rehab. However, patient progressed over hospitalization to outpatient PT/OT.  ??  Presumptive??alcoholic hepatitis:??Elevated transaminases on admission (AST 262, ALT 79),??seemingly consistent with alcoholic hepatitis. ??Madrey's discriminant function score less than 32, indicating no utility of corticosteroids at this time. Hepatitis panel non-reactive. LFT's trended down, now normalized.   ??  Hyponatremia: NA 128 on admission.??Secondary to ongoing alcohol use with low solute intake. received 2.5 L of IV fluids in ED. Improved  to 130.  Repeat sodium remained stable.   ??  Pancytopenia:??Likely secondary to chronic alcohol use.??Monitored daily while here, platelets slowly trending up.       Procedures:  No admission procedures for hospital encounter. ______________________________________________________________________  Discharge Medications:     Your Medication List      START taking these medications    folic acid 1 MG tablet  Commonly known as: FOLVITE  Take 1 tablet (1 mg total) by mouth daily.  Start taking on: March 01, 2019     magnesium oxide 400 mg (241.3 mg magnesium) tablet  Commonly known as: MAG-OX  Take 1 tablet (400 mg total) by mouth daily.     multivitamins, therapeutic with minerals 9 mg iron-400 mcg tablet  Take 1 tablet by mouth daily.  Start taking on: March 01, 2019     nicotine 21 mg/24 hr patch  Commonly known as: NICODERM CQ  Place 1 patch on the skin daily. Remove old patch before applying new one.  Start taking on: March 01, 2019     thiamine 100 MG tablet  Commonly known as: B-1  Take 1 tablet (100 mg total) by mouth daily.  Start taking on: March 01, 2019        CONTINUE taking these medications    empty container Misc  Use as directed to dispose of Humira syringes.     fluticasone propionate 50 mcg/actuation nasal spray  Commonly known as: FLONASE  2 sprays by Each Nare route daily.     halobetasol 0.05 % ointment  Commonly known as: ULTRAVATE  Apply topically Two (2) times a day. To affected areas as needed.     HUMIRA(CF) 40 mg/0.4 mL injection  Generic drug: adalimumab  Inject the contents of 2 syringes (80 mg) under the skin on day 1. then inject the contents of 1 syringe (40 mg) every 14 days, beginning on day 8.     omeprazole 20 MG capsule  Commonly known as: PriLOSEC  Take 1 capsule (20 mg total) by mouth daily.     sildenafiL (pulm.hypertension) 20 mg tablet  Commonly known as: REVATIO  2-5 tabs prior to intercourse as needed            Allergies:  Hydroxyzine  ______________________________________________________________________  Pending Test Results (if blank, then none):  None    Most Recent Labs:  All lab results last 24 hours -   Recent Results (from the past 24 hour(s))   CBC    Collection Time: 02/28/19 3:34 AM   Result Value Ref Range    WBC 5.3 4.5 - 11.0 10*9/L    RBC 2.96 (L) 4.50 - 5.90 10*12/L    HGB 11.2 (L) 13.5 - 17.5 g/dL    HCT 16.1 (L) 09.6 - 53.0 %    MCV 114.0 (H) 80.0 - 100.0 fL    MCH 38.0 (H) 26.0 - 34.0 pg    MCHC 33.4 31.0 - 37.0 g/dL    RDW 04.5 40.9 - 81.1 %    MPV 9.7 7.0 - 10.0 fL    Platelet 228 150 - 440 10*9/L   Magnesium Level    Collection Time: 02/28/19  3:34 AM   Result Value Ref Range    Magnesium 1.2 (L) 1.6 - 2.2 mg/dL   Comprehensive Metabolic Panel    Collection Time: 02/28/19  3:34 AM   Result Value Ref Range    Sodium 131 (L) 135 - 145 mmol/L    Potassium 3.5 3.5 -  5.0 mmol/L    Chloride 96 (L) 98 - 107 mmol/L    Anion Gap 6 (L) 7 - 15 mmol/L    CO2 29.0 22.0 - 30.0 mmol/L    BUN 10 7 - 21 mg/dL    Creatinine 1.61 (L) 0.70 - 1.30 mg/dL    BUN/Creatinine Ratio 15     EGFR CKD-EPI Non-African American, Male >90 >=60 mL/min/1.31m2    EGFR CKD-EPI African American, Male >90 >=60 mL/min/1.98m2    Glucose 113 70 - 179 mg/dL    Calcium 8.7 8.5 - 09.6 mg/dL    Albumin 3.1 (L) 3.5 - 5.0 g/dL    Total Protein 6.4 (L) 6.5 - 8.3 g/dL    Total Bilirubin 0.4 0.0 - 1.2 mg/dL    AST 28 19 - 55 U/L    ALT 25 <50 U/L    Alkaline Phosphatase 119 38 - 126 U/L       Relevant Studies/Radiology (if blank, then none):  Ecg 12 Lead (adult)    Result Date: 02/20/2019  NORMAL SINUS RHYTHM RSR' OR QR PATTERN IN V1 SUGGESTS RIGHT VENTRICULAR CONDUCTION DELAY PROLONGED QT ABNORMAL ECG WHEN COMPARED WITH ECG OF 25-Oct-2018 12:56, NONSPECIFIC T WAVE ABNORMALITY NOW EVIDENT IN INFERIOR LEADS Confirmed by Freeman Caldron ([id number]) on 02/20/2019 11:39:28 AM    Ct Head Wo Contrast    Result Date: 02/20/2019  EXAM: Computed tomography, head or brain without contrast material. DATE: 02/20/2019 4:33 AM ACCESSION: [id number] UN DICTATED: 02/20/2019 4:37 AM INTERPRETATION LOCATION: [facility] CLINICAL INDICATION: 54 years old Male with seizure, hx of recurrent falls with +LOC, eval for intracranial abnormalities  COMPARISON: 10/25/2018 TECHNIQUE: Axial CT images of the head  from skull base to vertex without contrast. FINDINGS: Diffuse cerebral volume loss with prominence of CSF-containing spaces. There are a few scattered hypodense foci within the periventricular and deep white matter.  These are nonspecific but commonly associated with small vessel ischemic changes, not significantly changed from prior. There is no midline shift. No mass lesion. There is no evidence of acute infarct. No acute intracranial hemorrhage. No fractures are evident. Polypoid mucosal of the maxillary sinuses. Calcifications of the bilateral carotid siphons.     -No acute intracranial process.    Xr Shoulder 2 Views Left    Result Date: 02/26/2019  EXAM: XR SHOULDER 2 VIEWS LEFT DATE: 02/26/2019 2:27 PM ACCESSION: [id number] UN DICTATED: 02/26/2019 2:29 PM INTERPRETATION LOCATION: [facility] CLINICAL INDICATION: 54 years old Male with Limited ROM in L shoulder s/p fall  COMPARISON: None. TECHNIQUE: AP, Grashey, outlet and axillary views of the left shoulder. FINDINGS: Normal glenohumeral joint space and alignment. 2 mm calcification adjacent to the inferior glenoid rim, likely a loose body. No acute fracture or dislocation. Normal subacromial space. Mild acromioclavicular osteoarthritis. Unremarkable left hemithorax.     Small probable loose body and mild acromioclavicular osteoarthritis. No acute findings.    ______________________________________________________________________  Discharge Instructions:   Activity Instructions     Activity as tolerated            Diet Instructions     Discharge diet (specify)      Discharge Nutrition Therapy: General          Other Instructions     Call MD for:  difficulty breathing, headache or visual disturbances      Call MD for:  persistent dizziness or light-headedness      Call MD for:  persistent nausea or vomiting      Call MD for:  severe uncontrolled  pain      Call MD for:  temperature >38.5 Celsius      Discharge instructions      You were admitted to the hospital after having a seizure from alcohol withdrawal. You have completed your detox from alcohol and doing much better. Please look into the resources for staying sober that were provided by the social worker.    I have sent you some prescriptions for vitamins that have been low in your body from the effects of the alcohol. Please take these everyday. I have also sent a prescription for nicotine patches that you can use at home.               Follow Up instructions and Outpatient Referrals     Call MD for:  difficulty breathing, headache or visual disturbances      Call MD for:  persistent dizziness or light-headedness      Call MD for:  persistent nausea or vomiting      Call MD for:  severe uncontrolled pain      Call MD for:  temperature >38.5 Celsius      Discharge instructions      Occupational Therapy      Physical Therapy      Suggest Treatment: Evaluation with suggestions for treatment        ______________________________________________________________________  Discharge Day Services:  BP 173/102  - Pulse 87  - Temp 36.5 ??C (97.7 ??F) (Oral)  - Resp 18  - Ht 185.4 cm (6' 1)  - Wt 82.6 kg (182 lb 1.6 oz)  - SpO2 97%  - BMI 24.03 kg/m??   Pt seen on the day of discharge and determined appropriate for discharge.    Condition at Discharge: good    Length of Discharge: I spent greater than 30 mins in the discharge of this patient.

## 2019-02-28 NOTE — Unmapped (Signed)
Pt is alert and oriented x4, calm and cooperative. He denies having any pain and VSS on RA, except for some HTN which MDs are aware. He is tolerating meals and meds well and has ambulated with PT this AM. Discharge instructions reviewed with pt and pt verbalized complete understanding. All questions answered. Pt hospitality called to transport pt to lobby and receive meds from pharmacy. GF driving to meet him lobby.

## 2019-02-28 NOTE — Unmapped (Signed)
Afeb, VSS. No complaints of pain. No PRNs needed. PIV remains saline locked. Pt self positioning. Voiding well. No family at bedside. Pt aware of POC.      Problem: Adult Inpatient Plan of Care  Goal: Plan of Care Review  Outcome: Ongoing - Unchanged  Goal: Patient-Specific Goal (Individualization)  Outcome: Ongoing - Unchanged  Goal: Absence of Hospital-Acquired Illness or Injury  Outcome: Ongoing - Unchanged  Goal: Optimal Comfort and Wellbeing  Outcome: Ongoing - Unchanged  Goal: Readiness for Transition of Care  Outcome: Ongoing - Unchanged  Goal: Rounds/Family Conference  Outcome: Ongoing - Unchanged     Problem: Fall Injury Risk  Goal: Absence of Fall and Fall-Related Injury  Outcome: Ongoing - Unchanged     Problem: Hypertension Comorbidity  Goal: Blood Pressure in Desired Range  Outcome: Ongoing - Unchanged     Problem: Self-Care Deficit  Goal: Improved Ability to Complete Activities of Daily Living  Outcome: Ongoing - Unchanged

## 2019-03-01 MED ORDER — NICOTINE 21 MG/24 HR DAILY TRANSDERMAL PATCH
MEDICATED_PATCH | Freq: Every day | TRANSDERMAL | 0 refills | 28 days | Status: CP
Start: 2019-03-01 — End: ?
  Filled 2019-02-28: qty 28, 28d supply, fill #0

## 2019-03-01 MED ORDER — FOLIC ACID 1 MG TABLET
ORAL_TABLET | Freq: Every day | ORAL | 0 refills | 30 days | Status: CP
Start: 2019-03-01 — End: 2019-03-31
  Filled 2019-02-28: qty 30, 30d supply, fill #0

## 2019-03-01 MED ORDER — MULTIVITAMIN-IRON 9 MG-FOLIC ACID 400 MCG-CALCIUM AND MINERALS TABLET
ORAL_TABLET | Freq: Every day | ORAL | 0 refills | 130 days | Status: CP
Start: 2019-03-01 — End: 2019-07-08

## 2019-03-01 MED ORDER — THIAMINE HCL (VITAMIN B1) 100 MG TABLET
ORAL_TABLET | Freq: Every day | ORAL | 3 refills | 100.00000 days | Status: CP
Start: 2019-03-01 — End: 2020-02-29
  Filled 2019-02-28: qty 100, 100d supply, fill #0

## 2019-03-06 ENCOUNTER — Institutional Professional Consult (permissible substitution): Admit: 2019-03-06 | Discharge: 2019-03-07 | Payer: MEDICAID | Attending: Pediatrics | Primary: Pediatrics

## 2019-03-06 MED ORDER — LISINOPRIL 10 MG TABLET
ORAL_TABLET | Freq: Every day | ORAL | 11 refills | 30.00000 days | Status: CP
Start: 2019-03-06 — End: 2020-03-05

## 2019-03-06 MED ORDER — CITALOPRAM 10 MG TABLET
ORAL_TABLET | Freq: Every day | ORAL | 2 refills | 30.00000 days | Status: CP
Start: 2019-03-06 — End: 2020-03-05

## 2019-03-07 NOTE — Unmapped (Signed)
Contact Information     Person Contacted: Patient  Contact Phone number: [phone] (home)   Phone Outcome: Contacted Patient/Caregiver  Is there someone else in the room? No.   I have identified myself to the patient and conveyed my credentials to [name]    [name] is a 54 y.o. male  participating in a telephone encounter.  I have reviewed the problem list, medications, and allergies and have updated/reconciled them if needed.    REASON FOR [facility] follow-up    SUBJECTIVE     54 year old gentleman who was recently admitted with an alcohol use disorder, pancytopenia, syncope which was thought related to alcohol withdrawal seizure.  Patient was treated in the hospital and detoxed and is now abstinent he was discharged from the hospital last week and denies any alcohol.  He has upcoming rehab for strengthening of his legs as well as upcoming appointment with alcohol and substance abuse program or [facility].  He has had no further seizures but has had recurrence of his elevated blood pressures as well as some anxiety mild depression.  Patient was previously treated with citalopram and lisinopril.    Assessment/Plan     Diagnoses and all orders for this visit:    Severe alcohol use disorder (CMS-HCC)    Essential hypertension    Pancytopenia (CMS-HCC)    Other orders  -     lisinopriL (PRINIVIL,ZESTRIL) 10 MG tablet; Take 1 tablet (10 mg total) by mouth daily.  -     citalopram (CELEXA) 10 MG tablet; Take 1 tablet (10 mg total) by mouth daily.    Recommended and supported total abstinence from alcohol.  Patient has upcoming appointment with alcohol substance abuse program we will restart his citalopram and lisinopril.  Would consider use of other medications to assist with cravings.  Patient currently denies any craving and has good support at home.  Reporting blood pressures in the 160 range systolic.  Recommended follow-up in approximately 3 weeks    Visit Conducted via Telephone    I spent 20 minutes on the phone with the patient on the date of service. I spent an additional 3 minutes on pre- and post-visit activities.     The patient was physically located in [place] or a state in which I am permitted to provide care. The patient and/or parent/guardian understood that s/he may incur co-pays and cost sharing, and agreed to the telemedicine visit. The visit was reasonable and appropriate under the circumstances given the patient's presentation at the time.    The patient and/or parent/guardian has been advised of the potential risks and limitations of this mode of treatment (including, but not limited to, the absence of in-person examination) and has agreed to be treated using telemedicine. The patient's/patient's family's questions regarding telemedicine have been answered.     If the visit was completed in an ambulatory setting, the patient and/or parent/guardian has also been advised to contact their provider???s office for worsening conditions, and seek emergency medical treatment and/or call 911 if the patient deems either necessary.

## 2019-03-08 ENCOUNTER — Ambulatory Visit: Admit: 2019-03-08 | Discharge: 2019-04-06 | Payer: MEDICAID

## 2019-03-08 ENCOUNTER — Ambulatory Visit
Admit: 2019-03-08 | Discharge: 2019-04-06 | Payer: MEDICAID | Attending: Rehabilitative and Restorative Service Providers" | Primary: Rehabilitative and Restorative Service Providers"

## 2019-03-08 NOTE — Unmapped (Addendum)
NEUROLOGICAL OUTPATIENT PHYSICAL THERAPY   EVALUATION AND PLAN OF CARE    Patient Name: Nicholas Rush  Date of Birth:May 06, 1965  Date: 03/08/2019  Session Number:  1  Therapy Diagnosis:   Encounter Diagnoses   Name Primary?   ??? Recurrent falls Yes   ??? 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 Muscle weakness (generalized)    ??? Impaired mobility and activities of daily living        Date of Injury/Onset:02/19/19  Date of Evaluation: 03/08/2019  Referring Pracitioner: Kathryne Hitch, MD  Certification Dates: Medicaid - PT Requesting 2 x week for 4 weeks or 8 visits.)     Patient wore a mask for the entire therapy session., Therapist wore a mask for the entire session.  and Therapist wore eye goggles/frames during the entire session.     ASSESSMENT:    54 y.o. male presents with fall due to alcoholic seizure with resultant 9 day hospitalization with reported global leg weakness and decreased ability to perform MRADLs.  Patient reports frequent LOB and falls.  Last alcohol intake was 03/05/19.  Patient will benefit from skilled Physical Therapy services to address weakness, decreased endurance, poor coordination and balance.     ED Note 02/19/19   This is a 54 year old-year-old male with history of hypertension, anxiety, depression, alcohol abuse (drinks 1/5 of liquor every day), GERD, psoriasis (on Humira), who presents with EMS for seizure-like activity just prior to arrival.  Per EMS, patient's girlfriend noted that he was acting his normal self today, however was lying on the sofa and began having generalized tonic-clonic like seizure-like activity that lasted 2 to 3 minutes in duration and spontaneously resolved.  When EMS arrived, he was postictal appearing, confused, but trying to follow commands.    ??  Initial vital signs mildly tachycardic at 105, but otherwise reassuring and afebrile and satting well on RA.  Patient chronically ill and disheveled appearing, smells of EtOH.  A&Ox4. Nonlabored breathing and clear lungs.  Patient has mild tachycardia, but otherwise reassuring cardiac exam.  No lower extremity edema, symmetric in size nontender palpation.  Head normocephalic atraumatic and midface stable.  No signs of dental trauma.  Extremities without gross signs of trauma, deformity, swelling, ecchymosis, lacerations or abrasions.  No midline spinous tenderness, step-offs or deformities.  Abdomen is soft and nontender to palpation.  Patient does have a fine bilateral upper extremity tremor.  Neurologic examination with CN II-XII intact and 5/5 strength bilateral UE and 4+/5 strength bilateral LE. Sensation to light touch intact. Patient able to ambulate with some assistance but slightly wobbly on his feet.   ??  Suspect patient truly did have a seizure 2/2 unknown cause but includes EtOH withdrawal, electrolyte or metabolic derangement. Low concern for infectious etiology vs. Intracranial abnormality given no systemic infectious symptoms and normal neurologic examination. I do have concern for potential Wernicke's-Korsakoff or other EtOH induced/thiamine deficient neurologic/cerebellar issues with his falls and difficulty ambulating over the past year.   ??  Patient remains on the cardiac monitor.  Will obtain an EKG, basic lab, lactate, alcohol level, urinalysis, LFTs.  We will continue to monitor and repeat neurologic examination.  We will give another liter IV fluid bolus. Will give thiamine and folate IV. Will place on CIWA protocol. Patient does want EtOH detox at this time.   ??  EKG with NSR and mildly prolonged QTc at 480, but no ST or T wave abnormalities concerning for acute  ischemia. Initial lactate 7.1 (suspect 2/2 seizure like activity). Low concern for infectious etiology. We will repeat after 2L IVF bolus. Initial CIWA 8 (given 2mg  IV ativan). Labs with slight leukopenia at 3.9, stable hemoglobin. Patient has hypoNa at 128 (new since 10/2018) and hypokalemia at 3.2 (will give KCl). Cr WNLs. LFTs elevated at 262 and 79, AST, ALT, respectively. EtOH level 111. Will repeat EtOH level at 23:00 to see if <80 and then page [facility] for admission.   ??  I spoke with [facility] who states that neurology has been admitting some alcohol withdrawal patients to help off-load given COVID-19 pandemic. Will page neurology resident. EtOH level now 55. Patient has received a total of 4mg  IV ativan since arrival??  ??  Problem List: decreased LE  strength, impaired balance, deconditioning, impaired functional mobility, impaired ADLs and impaired self care    Prognosis:  Good   Positive Indicators: age and motivation   Negative Indicators: behavior, symptom severity, multiple co-morbidities and chronic alcohol toxicity    Personal Factors/Comorbidities Present: Alcohol Addiction    Examination of Body Systems:  MSK, balance and Neuro      Clinical Decision Making: .Moderate Complexity Eval Code:  Data from the patient???s history indicates 2 -3 personal factors or comorbidities that will affect the treatment of the current problem. Examination of body systems reveals 2-3 body structures, functions, activity limitations and/or participation restrictions and the clinical presentation is stable. The results of a standardized functional test or outcome measure indicates the clinical decision making was of moderate complexity.  Goals    Patient/Family Goals: I want to get the ability to use my left hand.  I want to be able to stand up without need for UE support      Goals: 4 weeks    Patient will be able to perform sit to stand from standard 19 inch surface with no use   Stand step transfers with LRAD and Ind.   Improve 5 STS to <10 sec or that of age appropriate peers  Improve  TUG < 11 sec or that of age appropriate peers,   Improve balance on BERG Balance Scale to  > 53/56 and DGI  >19/23 as masure of increased balance.   Patient will report No falls4 weeks.   Patient will go Up and down 12 steps with unilateral rail and reciprocal pattern with Mod Ind.   Floor transfers with Mod I and external support  Increase Endurance to tolerate 20 min of LE there Ex on Nustep or Bike without need for rest breaks.        PLAN/Recommendations: 2 x a week for 4 weeks, or 8 sessions  Planned Interventions: Gait Training  Therapeutic Activites  Neuromuscular Education  Self-Care  Physical Performance Test  Therapeutic exercise  Balance training  PT DME/Equipment Recs: Assistive Device for ambulation    SUBJECTIVE:  Pt states: I realize now just how destructive my alcoholism had become.    Reason for Referral/History of Present Condition/Onset of injury/exacerbation:   54 y.o. male presents to the [facility] with alcohol withdrawal seizures and recurrent falls.      Last Fall:  02/19/2019.  Before this I was falling daily - I have lots of  dizziness  Precautions: Fall Risk  Red Flags:  night sweats    Prior Functional Status: Ind with ambulation and MRADLS.  Had a 2 month history of limited activity due to falling prior to this seizure     Current Functional  Status:  I feel very off balance.  I can't stand up from a low chair and I need to sit to take a shower. All not normal for my age.    Currently used Equipment: Walker but not using,.tub bench   Previous Treatment: PT in hospital  Employment/Recreation: Not sure.  I spent a lot of my time drinking.     Set up with  Addiction counselor off of  [employer].  Has not called to make the apt yet.  Patient notes he drank 5 days after discharge from the hospital.  Encouraged patient to call about this apt .     Social History: Lives with girlfriend who has 2 adolescent kids 18 yr old and 93 year old . Home has a  Ramp with rail and 7 STE.   Girlriend works during the day.  Patient feels good about the set up because he is never alone.   One or both of kids are there all the time.      Caregiver availability, capability, willingness:   Tour manager  .  Independent w/ ADLs?: Needs assist with standing and shower    Patient???s communication preference: Verbal, Written and Visual    Barriers to Learning: addictive tendency and impulsive nature    Recent Procedures/Tests/Findings      Past Medical History:   Diagnosis Date   ??? Anxiety    ??? Depressive disorder    ??? Erectile dysfunction    ??? Esophageal reflux    ??? History of pyloric stenosis    ??? Hypertension    ??? Insomnia    ??? Psoriasis     Dermatologist - Dr. Deloria Lair in [facility]   ??? Seizure (CMS-HCC) 02/20/2019   ??? Tobacco use     1ppd     Family History   Problem Relation Age of Onset   ??? Bipolar disorder Mother    ??? Depression Sister    ??? Alcohol abuse Sister    ??? Alcohol abuse Maternal Aunt    ??? Melanoma Neg Hx    ??? Basal cell carcinoma Neg Hx    ??? Squamous cell carcinoma Neg Hx        Past Surgical History:   Procedure Laterality Date   ??? PYLOROMYOTOMY     ??? WISDOM TOOTH EXTRACTION        Allergies   Allergen Reactions   ??? Hydroxyzine Itching     Sedation.  Ineffective for reducing anxiety.        Social History     Tobacco Use   ??? Smoking status: Current Every Day Smoker     Packs/day: 1.00     Years: 30.00     Pack years: 30.00     Types: Cigarettes   ??? Smokeless tobacco: Never Used   Substance Use Topics   ??? Alcohol use: Yes     Alcohol/week: 42.0 standard drinks     Types: 42 Cans of beer per week     Comment: 6 pack every day      Current Outpatient Medications   Medication Sig Dispense Refill   ??? ADALIMUMAB SYRINGE CITRATE FREE 40 MG/0.4 ML Inject the contents of 2 syringes (80 mg) under the skin on day 1. then inject the contents of 1 syringe (40 mg) every 14 days, beginning on day 8. 4 each 11   ??? citalopram (CELEXA) 10 MG tablet Take 1 tablet (10 mg total) by mouth daily. 30 tablet 2   ??? empty  container Misc Use as directed to dispose of Humira syringes. 1 each 2   ??? fluticasone propionate (FLONASE) 50 mcg/actuation nasal spray 2 sprays by Each Nare route daily. 16 g 12   ??? folic acid (FOLVITE) 1 MG tablet Take 1 tablet (1 mg total) by mouth daily. 30 tablet 0 ??? halobetasol (ULTRAVATE) 0.05 % ointment Apply topically Two (2) times a day. To affected areas as needed. 60 g 10   ??? lisinopriL (PRINIVIL,ZESTRIL) 10 MG tablet Take 1 tablet (10 mg total) by mouth daily. 30 tablet 11   ??? magnesium oxide (MAG-OX) 400 mg (241.3 mg magnesium) tablet Take 1 tablet (400 mg total) by mouth daily. 120 tablet 0   ??? multivitamins, therapeutic with minerals 9 mg iron-400 mcg tablet Take 1 tablet by mouth daily. 130 tablet 0   ??? nicotine (NICODERM CQ) 21 mg/24 hr patch Place 1 patch on the skin daily. Remove old patch before applying new one. 28 patch 0   ??? omeprazole (PRILOSEC) 20 MG capsule TAKE 1 CAPSULE(20MG  TOTAL) BY MOUTH EVERY DAY 90 capsule 1   ??? sildenafiL, pulm.hypertension, (REVATIO) 20 mg tablet 2-5 tabs prior to intercourse as needed 30 tablet 5   ??? thiamine (B-1) 100 MG tablet Take 1 tablet (100 mg total) by mouth daily. 100 tablet 3     No current facility-administered medications for this visit.             OBJECTIVE :  Posture:    Sitting: rounded shoulders, foward head and increased thoracic kyphosis   Standing: wide BOS and increased lumbar lordosis  Skin assessment/Edema: n/a    Pain:  Current:  0/10   Max: 7/10    Pain Aggravated By:   Walking and standing - in these positions patient has severe lordosis   Pain Relieved By: lying down      Sensation: Intact except Left hand is numb       Vision:   20/25  Reading glasses     Vestibular:    No room spinning dizziness, but gets orthostatic.  On BP Meds     Current Vitals 136/92    Range of Motion:   WNL x 4       Strength/MMT:   biceps and triceps and grasp 5/5  Shoulder 3+/53+/5 ankles Quads,  Hams and hip Flex   UE MMT   UE MMT Left Right   Shoulder flex: 3+/5 3+/5   Shoulder abd: (C5) 3+/5 3+/5   Elbow flex:  (C6) 5/5 5/5   Elbow ext: (C7) 5/5 5/5   Finger Flex/Grasp:  5/5 5/5         LE MMT   LE MMT Left Right   Hip flex:  (L2) 3-/5 3-/5   Hip ext: 3-/5 3-/5   Hip abd: 3-/5 3-/5   Knee ext:  (L3) 3+/5 3+/5   Knee flex: (S2) 3+/5 3+/5   Ankle DF:  (L4) 4/5 4/5   Ankle PF:  (S1) 4/5 4/5            Motor Function/Coordination:  Bilaterally impaired   Impaired finger to nose coordination  Impaired heel to shin coordination  Impaired Rapid alternating movement     Transfers:    Supine<>Sit: Ind with slow speed   Sit<>Stand:REquires heavy reliance on UEs.       Balance:    FTEO: CGA for balance  FTEC Min A for balance     Endurance : 3 min only on nustep with  UE and LEs Level 1 .  Needs rest due to fatigue.         DYNAMIC GAIT INDEX    Grading: record the lowest category that applies.   1.Gait level surface: 2  Instructions: Walk at your normal speed from here to the next mark (20???).     (3) Normal: walks 20???, no assistive devices, good speed, no evidence for imbalance, normal gait pattern.   (2) Mild impairment: walks 20???, uses assistive devices, slower speed, mild gait deviations.   (1) Moderate impairment: walks 20???, slow speed, abnormal gait patters, evidence for imbalance.   (0) Severe impairment: cannot walk 20??? without assistance, severe gait deviations or imbalance.     2.Change in gait speed: 1  Instructions: Begin walking at your normal pace (for 5???), when I tell you ???go???, walk as fast as you can (for 5???). When I tell you ???slow???, walk as slowly as you can (for 5???).     (3) Normal: Able to smoothly change walking speed without loss of balance or gait deviation. Shows significant difference in walking speeds between normal, fast and slow paces.   (2) Mild impairment: Is able to change speed but demonstrates mild gait deviations, or no gait deviations but unable to achieve a significant change in velocity, or uses as assistive device.   (1) Moderate impairment: Makes only minor adjustments to walking speed, or accomplishes a change in speed with significant gait deviations, or changes speed but loses balance but is able to recover and continue walking.   (0) Severe impairment: Cannot change speeds, or loss balance and has to reach for a wall or be caught.     3.Gait with horizontal head turns: 1  Instructions: Begin walking at your normal pace. When I tell you to ???look right???, keep walking straight, but turn your head to the right. Keep looking to the right until I tell you ???look left???, then keep walking straight and turn your head to the left. Keep your head to the left until I tell you, ???look straight???, then keep walking straight, but return your head to the centre.     (3) Normal: Performs head turns smoothly with no change in gait.   (2) Mild impairment: Performs head turns smoothly with slight change in gait velocity, i.e. minor disruption to smooth gait path or uses walking aid.   (1) Moderate impairment: Performs head turns with moderate change in gait velocity, slows down, staggers, but recovers, can continue to walk.   (0) Severe impairment: Performs task with severe disruption of gait, i.e. staggers outside 15??? path, loses balance, stops, reaches for wall.     4.Gait with vertical head turns: 1  Instructions: Begin walking at your normal pace. When I tell you to ???look up???, keep walking straight, but tip your head and look up. Keep looking up until I tell you, ???look down???. Then keep walking straight and turn your head down. Keep looking down until I tell you, ??? look straight???, then keep walking straight, but return your head to the centre.     (3) Normal: Performs head turns smoothly with no change in gait.   (2) Mild impairment: Performs head turns smoothly with slight change in gait velocity, i.e. minor disruption to smooth gait path or uses walking aid.   (1) Moderate impairment: Performs head turns with moderate change in gait velocity, slows down, staggers, but recovers, can continue to walk.   (0) Severe impairment: Performs task with severe disruption of  gait, i.e. staggers outside 15??? path, loses balance, stops, reaches for wall.     5.Gait and pivot turn: 1  Instructions: Begin walking at your normal pace. When I tell you, ???turn and stop???, turn as quickly as you can to face the opposite direction and stop.     (3) Normal: Pivot turns safely within 3 seconds and stops quickly with no loss of balance.   (2) Mild impairment: pivot turns safely in >3 seconds and stops with no loss of balance.   (1) Moderate impairment: Turns slowly, requires verbal cueing, requires several small steps to catch balance following turn and stop.   (0) Severe impairment: Cannot turn safely, requires assistance to turn and stop.     6.Step over obstacle: 1  Instructions: Begin walking at your normal speed. When you come to the shoebox, step over it, not around it, and keep walking.     (3) Normal: Is able to step over box without changing gait speed; no evidence for imbalance.   (2) Mild impairment: Is able to step over shoe box, but must slow down and adjust steps to clear box safely.   (1) Moderate impairment: Is able to step over box but must stop, then step over. May require verbal cueing.   (0) Severe impairment: Cannot perform without assistance.     7.Step around obstacles: 3  Instructions: Begin walking at normal speed. When you come to the first cone (about 6??? away), walk around the right side of it. When you some to the second cone (6??? past first cone), walk around it to the left.     (3) Normal: Is able to walk safely around cones safely without changing gait speed; no evidence of imbalance.   (2) Mild impairment: Is able to step around both cones, but must slow down and adjust steps to clear cones.   (1) Moderate impairment: Is able to clear cones but must significantly slow speed to accomplish task, or requires verbal cueing.   (0) Severe impairment: Unable to clear cones, walks into one or both cones, or requires physical assistance.     8.Steps: 1  Instructions: Walk up these stairs as you would at home.(i.e. using a rail if necessary. At the top, turn around and walk down.     (3) Normal: Alternating feet, no rail.   (2) Mild impairment: Alternating feet, must use rail.   (1) Moderate impairment: Two feet to a stair, must use rail.   (0) Severe impairment: Cannot do safely.     TOTAL SCORE: 11/24  F:\Intranet\[facility] website\physiotherapy section\Dynamic Gait Index v.doc       BERG: TBD next session     Gait Analysis:   Ambulates 150 feet with CGA- min A no AD  -  Deviations :Lumbar lordosis, knee flexion, hip flexion and shuffling gait with wide BOS and frequent LOB.      Stairs x 6 with heavy reliance on bilateral rails and step to gait pattern.      Floor transfer with Mod A  And external support.       Functional Tests/Outcome Measures:     TUG:  18.56 sec     5 STS 20.21 sec     Emailed Initial HEP of postural positioning to decrease back pain and supine/ seated LE there Ex for strengthening.    LE there Ex supine     Email: [email or web address]     Patient Education: Throughout evaluation patient educated regarding the following: Role of PT  in Rehabilitation, HEP, importance of therapy, posture, treatment plan and Indications/Contraindications to Exercises. Patient demonstrated and verbalized agreement and understanding.        Communication/consultation with other professionals:  discussed POC with OT  discussed POC with PT     Total Time: 45 min   PT Evaluation: 30 min   Treatment Rendered:    Neuromuscular Re-education: 15 mins     I attest that I have reviewed the above information.  Signed: Vira Blanco, PT  03/08/2019 1:49 PM

## 2019-03-08 NOTE — Unmapped (Signed)
OUTPATIENT OCCUPATIONAL THERAPY   Note Type: Evaluation       Patient Name: Nicholas Rush  Date of Birth:[birth year]  Date of Visit: 03/08/2019  Visit #:  1/10 towards re-assessment, 1 total.  Date of Injury/Onset: 02/19/2019  Date of Evaluation: 03/08/2019  Referring Physician: Mar Daring  Reason for Referral: Eval and Treat  Plan of Care: 03/08/2019 - 05/06/2019  Encounter Diagnoses   Name Primary?   ??? 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 Recurrent falls    ??? Fine motor impairment Yes   ??? Decreased strength        ASSESSMENT  54 y.o. year old right handed male with history of alcohol withdrawal seizures and recurrent falls with one resulting in surgery on left 5th digit due to injury from fall. Of note, pt's 5th digit demonstrates increased PIP flexion/decreased digit extension due to forming tightness following surgery. Pt presents with decreased bilateral strength and FMC, impairing participation in ADLs and IADLs.  At this time pt reports to be IND with ADL's but has not returned to IADL's such as meal prep due to fear of falling and back pain. Pt reports difficulty with buttons when donning shirts this date, may be related due to 5th digit injury that has not healed properly. Patient requires skilled outpatient Occupational Therapy services to address the above deficits and maximize independence with ADLs and IADLs in home environment.    Patient wore a mask for the entire therapy session., Therapist wore a mask for the entire session.  and Therapist wore eye goggles/frames during the entire session.       Complexity:  Moderate Complexity Eval Code: Clinical decision making was of moderate complexity, as data from the client???s history, profile, detailed assessments, and consideration of several treatment options.  Patient presents with comorbidities that affect occupational performance.  Minimal to moderate modification of tasks is necessary.       Goals   1. In 9 sessions, patient will perform home exercise program with need for cuing to max IND with ADLs and IADLs.  2. In 9 sessions, pt will demonstrate improved left fine motor coordination as evidenced by  9HPT in 1 minute or less for improved participation in ADLs and IADLs.  3. In 9 sessions, Pt will demonstrate improved right fine motor coordination as evidenced by completing 9HPT in 35 or fewer sec for improved participation in ADLs and IADLs.  4. In 9 sessions, patient will demonstrate improved grip strength by 5 lbs for improved functional use of bilateral hands in ADLs and IADLs.  5. In 9 sessions, Pt will perform a light meal prep task without LOB  in customary fashion to max IND with meal prep  6. In 9 sessions, patient will IND button up Oxford style shirt to max IND with dressing task.           Patient in agreement with plan of care?: yes    Prognosis for goal achievement: Fair due to overall health status, motivation and multiple co-morbidities.    PLAN  Pt. will participate in:  Therapeutic Exercise  Therapeutic Acitivity  Orthotic management  Self-care home training  Adaptive device    Next session: HEP    Planned frequency and duration of treatment:  2x a week for 4 weeks.  Plan will be adjusted as needed.     Past Medical History:   Past Medical History:   Diagnosis Date   ??? Anxiety    ???  Depressive disorder    ??? Erectile dysfunction    ??? Esophageal reflux    ??? History of pyloric stenosis    ??? Hypertension    ??? Insomnia    ??? Psoriasis     Dermatologist - Dr. Deloria Lair in [facility]   ??? Seizure (CMS-HCC) 02/20/2019   ??? Tobacco use     1ppd       Past Surgical History:   Past Surgical History:   Procedure Laterality Date   ??? PYLOROMYOTOMY     ??? WISDOM TOOTH EXTRACTION         SUBJECTIVE:  I can't stand long or walk long.     History of Present Condition: Per referring physician's note:  54 year old gentleman who was recently admitted with an alcohol use disorder, pancytopenia, syncope which was thought related to alcohol withdrawal seizure.  Patient was treated in the hospital and detoxed and is now abstinent he was discharged from the hospital last week and denies any alcohol.  He has upcoming rehab for strengthening of his legs as well as upcoming appointment with alcohol and substance abuse program or [facility].  He has had no further seizures but has had recurrence of his elevated blood pressures as well as some anxiety mild depression.  Patient was previously treated with citalopram and lisinopril.    Precautions: Falls    Prior Therapies: Inpatient OT and PT    Patient???s Goals: I would like to be able to do buttons.     Pain  Pain present?: yes;  Location: Lower back    Frequency/Duration: dependent on activity level  Intensity: 2/10 at best,   8/10 at worse,   6/10 today.   Aggravating Factors: Standing, walking  Relieving Factors: Tylenol and Advil and positioning     Social History:   Lives with: Girlfriend and her 2 kids, 2 dogs, and 1 cat   Stories: 1SH with ramp into home  Bathroom Layout: Tub shower  Home Equipment: Paediatric nurse, railing for toilet, raised toilet seat, hand held shower head    PLOF: Back pain that affected his mobility prior to seizures    OBJECTIVE  Current Level of Function  Feeding: independent  Grooming:  independent  Toileting: modified independence (raised toilet seat and toilet railings device)  Bathing: modified independence (shower chair device)  Dressing ZO:[id number]  Dressing LE: independent  Transfers (bed, chair, toilet, shower):unable to stand without railing   Bed Mobility: independent    Instrumental ADLs:  Medication management: Independent, educated on non child proof pill lids  Health management: Independent  Caregiver Availability/Willingness/Capability: Independent   Money management: Pt's girlfriend completes task   Vocational Pursuits: Not employed, pt used to work in [employer] and liked that  Leisure Pursuits: golf, sports, working out   Communication Management:   Cooking: Pt's girlfriend completes task   Shopping: Pt's girlfriend completes task    Laundry: Pt's girlfriend completes task   Cleaning: Pt's girlfriend completes task   Pet Care: Pt reports to be walking dogs without difficulty  Yard work: N/A  Driving per Patient report: Yes  Safety: Patient does use good safety/judgement for household/daily tasks.     Sleep: Pt reports his sleep scheduled hasn't changed much from being in the hospital     Strength Testing    5/5  Full Anti-Gravity plus Full Resistance-maintains  4/5  Full Anti-Gravity plus Partial Resistance- Gives  3/5  Full Anti-Gravity, cannot take Resistance  2/5  Full Gravity Eliminated  1/5  Partial Gravity  Eliminated  T    Trace  0/5  No Movement     Left Right ROM   Shoulder flex 4/5 4/5 WNL   Shoulder abd 4/5 4/5 WNL   Biceps  4/5 4/5 WNL   Triceps 4/5 4/5 WNL   External rot 4/5 4/5 WNL   Internal rot 4/5 4/5 WNL   Wrist ext 4/5 4/5 WNL   Wrist flex 4/5 4/5 WNL   Supination 4/5 4/5 WNL   Pronation 4/5 4/5 WNL       Grip Strength:  Grip Strength: A quantitative and objective measure of isometric muscular strength of the hand and forearm.  This instrument is scored using force production in pounds    Standardized procedure for positioning consists of the subject seated with back, pelvis, and knees as close to 90 degrees as possible, shoulder is adducted and neutrally rotated, elbow flexed at 90 degrees, forearm neutral, wrist held between 0-15 degrees of ulnar deviation. The arm is not supported by examiner or armrest and the dynamometer is presented vertically and in line with the forearm. Maximum grip is the mean of three trials.        L grip (pounds): 44, 44, 44 = 44lb average  R grip (pounds): 39, 38, 37 = 38lb average   Male - age 45-54 R: 79-151 L: 70-143      Coordination  Nine Hole Peg Test RUE: 41s LUE 1 minute 31 second with left hand   Age 36-54 R 16-23 L 16-25    Spasticity:  Modified Ashworth Scale of Muscle Spasticity - Key: Modified Ashworth (supine)   0: No increased tone   1: Slightly increased tone, manifested by a catch and release at end ROM   1+: Slightly increased tone, a catch followed by minimal resistance through remainder (1/2) of the ROM   2: More marked increase in tone, affected part easily moved   3: Considerable increase in tone, passive movement difficult   4: Affected part rigid in flexion or extension     Edema:   None reported    Sensation:  Comments: Pt reports his L hand often feels numb      Vision and Visual Perception:   Date of last vision exam: probably 2-3 years ago  Vision Dx: Longdistance  Glasses/How Long/Eye Trauma/Surgery: Pt wears reading glasses and has prescription glasses but only wears them when driving   Saccades: intact  Pursuits: jumping  Convergence x3 Endurance: impaired  Acuity: 20/25  Contrast Sensitivity: intact  Comment : 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 The family is aware that they must call to cancel appointments more than 24 hours in advance. They are also aware that if they late cancel or no-show three times, we reserve the right to cancel their remaining appointments. This policy is in place to allow [place] to best serve the needs of our caseload.       I attest that I have reviewed the above information.  Signed: Bernadette Hoit, OT 03/08/2019 4:29 PM

## 2019-03-14 NOTE — Unmapped (Signed)
[facility]  [address] [place], [place] [address]    [phone]    Nicholas Rush did not show for his  scheduled Occupational Therapy follow-up session. He called to cancel morning of due to insurance. Please contact me if you have any questions or concerns.     Thank you for this referral,     Signed: Bernadette Hoit, OT  03/14/2019 8:14 AM

## 2019-03-16 NOTE — Unmapped (Signed)
Nicholas Rush reports things are stable with his Humira, and he hasn't needed his topical steroids. He was hospitalized recently for a seizure/alcohol withdrawal and missed one dose, but reports he's had no flares, and is back on track now.      [facility] Clinical Assessment & Refill Coordination Note    Nicholas Rush, DOB: [birth year]  Phone: [phone] (home)     All above HIPAA information was verified with patient.     Was a Nurse, learning disability used for this call? No    Specialty Medication(s):   Inflammatory Disorders: Humira     Current Outpatient Medications   Medication Sig Dispense Refill   ??? ADALIMUMAB SYRINGE CITRATE FREE 40 MG/0.4 ML Inject the contents of 2 syringes (80 mg) under the skin on day 1. then inject the contents of 1 syringe (40 mg) every 14 days, beginning on day 8. 4 each 11   ??? citalopram (CELEXA) 10 MG tablet Take 1 tablet (10 mg total) by mouth daily. 30 tablet 2   ??? empty container Misc Use as directed to dispose of Humira syringes. 1 each 2   ??? fluticasone propionate (FLONASE) 50 mcg/actuation nasal spray 2 sprays by Each Nare route daily. 16 g 12   ??? folic acid (FOLVITE) 1 MG tablet Take 1 tablet (1 mg total) by mouth daily. 30 tablet 0   ??? halobetasol (ULTRAVATE) 0.05 % ointment Apply topically Two (2) times a day. To affected areas as needed. 60 g 10   ??? lisinopriL (PRINIVIL,ZESTRIL) 10 MG tablet Take 1 tablet (10 mg total) by mouth daily. 30 tablet 11   ??? magnesium oxide (MAG-OX) 400 mg (241.3 mg magnesium) tablet Take 1 tablet (400 mg total) by mouth daily. 120 tablet 0   ??? multivitamins, therapeutic with minerals 9 mg iron-400 mcg tablet Take 1 tablet by mouth daily. 130 tablet 0   ??? nicotine (NICODERM CQ) 21 mg/24 hr patch Place 1 patch on the skin daily. Remove old patch before applying new one. 28 patch 0   ??? omeprazole (PRILOSEC) 20 MG capsule TAKE 1 CAPSULE(20MG  TOTAL) BY MOUTH EVERY DAY 90 capsule 1   ??? sildenafiL, pulm.hypertension, (REVATIO) 20 mg tablet 2-5 tabs prior to intercourse as needed 30 tablet 5   ??? thiamine (B-1) 100 MG tablet Take 1 tablet (100 mg total) by mouth daily. 100 tablet 3     No current facility-administered medications for this visit.         Changes to medications: Nicholas Rush reports no changes at this time.    Allergies   Allergen Reactions   ??? Hydroxyzine Itching     Sedation.  Ineffective for reducing anxiety.       Changes to allergies: No    SPECIALTY MEDICATION ADHERENCE     Humira - 1 dose left  Medication Adherence    Patient reported X missed doses in the last month: 0  Specialty Medication: Humira  Patient is on additional specialty medications: No         Specialty medication(s) dose(s) confirmed: Regimen is correct and unchanged.     Are there any concerns with adherence? No    Adherence counseling provided? Not needed    CLINICAL MANAGEMENT AND INTERVENTION      Clinical Benefit Assessment:    Do you feel the medicine is effective or helping your condition? Yes    Clinical Benefit counseling provided? Not needed    Adverse Effects Assessment:    Are  you experiencing any side effects? No    Are you experiencing difficulty administering your medicine? No    Quality of Life Assessment:    How many days over the past month did your Psoriasis  keep you from your normal activities? For example, brushing your teeth or getting up in the morning. 0    Have you discussed this with your provider? Not needed    Therapy Appropriateness:    Is therapy appropriate? Yes, therapy is appropriate and should be continued    DISEASE/MEDICATION-SPECIFIC INFORMATION      For patients on injectable medications: Patient currently has 1 doses left.  Next injection is scheduled for asap - due today (past due).    PATIENT SPECIFIC NEEDS     ? Does the patient have any physical, cognitive, or cultural barriers? No    ? Is the patient high risk? No     ? Does the patient require a Care Management Plan? No     ? Does the patient require physician intervention or other additional services (i.e. nutrition, smoking cessation, social work)? No      SHIPPING     Specialty Medication(s) to be Shipped:   Inflammatory Disorders: Humira    Other medication(s) to be shipped: na     Changes to insurance: No    Delivery Scheduled: Yes, Expected medication delivery date: Wed, 3/3.     Medication will be delivered via Same Day Courier to the confirmed prescription address in [facility].    The patient will receive a drug information handout for each medication shipped and additional FDA Medication Guides as required.  Verified that patient has previously received a Conservation officer, historic buildings.    All of the patient's questions and concerns have been addressed.    Lanney Gins   [facility] Specialty Pharmacist

## 2019-03-20 NOTE — Unmapped (Signed)
[facility]  [address] [place], [place] [address]    [phone]    Nicholas Rush no showed to his scheduled Physical Therapy follow-up session. Therapist called and left voice message to see if patient would prefer a later time today or to reschedule to another day this week.      SignedJohnsie Cancel, PT  03/20/2019 11:52 AM

## 2019-03-22 MED FILL — HUMIRA SYRINGE CITRATE FREE 40 MG/0.4 ML: 28 days supply | Qty: 2 | Fill #6 | Status: AC

## 2019-03-22 MED FILL — HUMIRA SYRINGE CITRATE FREE 40 MG/0.4 ML: 28 days supply | Qty: 2 | Fill #6

## 2019-03-22 NOTE — Unmapped (Signed)
The care for this patient was completed by Vira Blanco, PT:  A student was present and participated in the care. Licensed/Credentialed therapist was physically present and immediately available to direct and supervise tasks that were related to patient management. The direction and supervision was continuous throughout the time these tasks were performed.    Vira Blanco, PT      NEUROLOGICAL OUTPATIENT PHYSICAL THERAPY   DAILY PROGRESS NOTE    Patient Name: Nicholas Rush  Date of Birth:05/20/1965  Date: 03/22/2019  Session Number:  2 ( 2/10 to reassessment)   Therapy Diagnosis:   Encounter Diagnoses   Name Primary?   ??? Muscle weakness (generalized) Yes   ??? Impaired mobility and activities of daily living    ??? Alcohol withdrawal seizure with complication (CMS-HCC)    ??? Abnormality of gait due to impairment of balance        Date of Injury/Onset:02/19/19  Date of Evaluation: 03/22/2019  Referring Pracitioner: Kathryne Hitch, MD  Certification Dates: Medicaid - PT Requesting 2 x week for 4 weeks or 8 visits.)     Patient wore a mask for the entire therapy session., Therapist wore a mask for the entire session.  and Therapist wore eye goggles/frames during the entire session.        ASSESSMENT:    54 y.o. male presents with fall due to alcoholic seizure with resultant 9 day hospitalization with reported global leg weakness and decreased ability to perform MRADLs.  Patient reports he does not notice a lot of change since last visit.  He reports the is walking more,  but not compliant with recommended exercises.  Reinforced importance of compliance with exercises for strengthening.  Today's session focused on trial of an AD due to poor performance on balance test last session (with score in high fall category).  In addition, worked on balance exercises and  Sit to stand.  Patient tolerated treatment well.   Patient will benefit from skilled Physical Therapy services to address weakness, decreased endurance, poor coordination and balance.     Problem List: decreased LE  strength, impaired balance, deconditioning, impaired functional mobility, impaired ADLs and impaired self care      Goals    Patient/Family Goals:.  I want to be able to stand up without need for UE support      Goals: 4 weeks    Patient will be able to perform sit to stand from standard 19 inch surface with no use   Stand step transfers with LRAD and Ind.   Improve 5 STS to <10 sec or that of age appropriate peers  Improve  TUG < 11 sec or that of age appropriate peers,   Improve balance on BERG Balance Scale to  > 53/56 and DGI  >19/23 as masure of increased balance.   Patient will report No falls4 weeks.   Patient will go Up and down 12 steps with unilateral rail and reciprocal pattern with Mod Ind.   Floor transfers with Mod I and external support  Increase Endurance to tolerate 20 min of LE there Ex on Nustep or Bike without need for rest breaks.        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    Reason for Referral/History of Present Condition/Onset of injury/exacerbation:   54 y.o. male presents to the [facility] with alcohol withdrawal seizures and recurrent falls.      Last Fall:  02/19/2019.  Before this I was falling daily - I have lots of  dizziness  Precautions: Fall Risk  Red Flags:  night sweats        Past Medical History:   Diagnosis Date   ??? Anxiety    ??? Depressive disorder    ??? Erectile dysfunction    ??? Esophageal reflux    ??? History of pyloric stenosis    ??? Hypertension    ??? Insomnia    ??? Psoriasis     Dermatologist - Dr. Deloria Lair in [facility]   ??? Seizure (CMS-HCC) 02/20/2019   ??? Tobacco use     1ppd Family History   Problem Relation Age of Onset   ??? Bipolar disorder Mother    ??? Depression Sister    ??? Alcohol abuse Sister    ??? Alcohol abuse Maternal Aunt    ??? Melanoma Neg Hx    ??? Basal cell carcinoma Neg Hx    ??? Squamous cell carcinoma Neg Hx        Past Surgical History:   Procedure Laterality Date   ??? PYLOROMYOTOMY     ??? WISDOM TOOTH EXTRACTION        Allergies   Allergen Reactions   ??? Hydroxyzine Itching     Sedation.  Ineffective for reducing anxiety.        Social History     Tobacco Use   ??? Smoking status: Current Every Day Smoker     Packs/day: 1.00     Years: 30.00     Pack years: 30.00     Types: Cigarettes   ??? Smokeless tobacco: Never Used   Substance Use Topics   ??? Alcohol use: Yes     Alcohol/week: 42.0 standard drinks     Types: 42 Cans of beer per week     Comment: 6 pack every day      Current Outpatient Medications   Medication Sig Dispense Refill   ??? ADALIMUMAB SYRINGE CITRATE FREE 40 MG/0.4 ML Inject the contents of 2 syringes (80 mg) under the skin on day 1. then inject the contents of 1 syringe (40 mg) every 14 days, beginning on day 8. 4 each 11   ??? citalopram (CELEXA) 10 MG tablet Take 1 tablet (10 mg total) by mouth daily. 30 tablet 2   ??? empty container Misc Use as directed to dispose of Humira syringes. 1 each 2   ??? fluticasone propionate (FLONASE) 50 mcg/actuation nasal spray 2 sprays by Each Nare route daily. 16 g 12   ??? folic acid (FOLVITE) 1 MG tablet Take 1 tablet (1 mg total) by mouth daily. 30 tablet 0   ??? halobetasol (ULTRAVATE) 0.05 % ointment Apply topically Two (2) times a day. To affected areas as needed. 60 g 10   ??? lisinopriL (PRINIVIL,ZESTRIL) 10 MG tablet Take 1 tablet (10 mg total) by mouth daily. 30 tablet 11   ??? magnesium oxide (MAG-OX) 400 mg (241.3 mg magnesium) tablet Take 1 tablet (400 mg total) by mouth daily. 120 tablet 0   ??? multivitamins, therapeutic with minerals 9 mg iron-400 mcg tablet Take 1 tablet by mouth daily. 130 tablet 0   ??? nicotine (NICODERM CQ) 21 mg/24 hr patch  Place 1 patch on the skin daily. Remove old patch before applying new one. 28 patch 0   ??? omeprazole (PRILOSEC) 20 MG capsule TAKE 1 CAPSULE(20MG  TOTAL) BY MOUTH EVERY DAY 90 capsule 1   ??? sildenafiL, pulm.hypertension, (REVATIO) 20 mg tablet 2-5 tabs prior to intercourse as needed 30 tablet 5   ??? thiamine (B-1) 100 MG tablet Take 1 tablet (100 mg total) by mouth daily. 100 tablet 3     No current facility-administered medications for this visit.             OBJECTIVE :      Pain:  Patient is unclear about pain.  At first he states that pain is worse if he is still and then he states pain is worse if he is walking.   Patient does not express that he is in pain during this session.     Falls:  None since last session.  Reports he is not using an AD despite recommendations.      Attempted to teach use of bilateral canes for support , but patient was unable to coordinate themith no luck,  Patient was able to use a RW with intermittent CGA - SBA. And Increased feelings of stability.      Balance:    FTEO: CGA for balance x 30 sec   FTEC Min A for balance x 15sec     Endurance : 3 min only on nustep with UE and LEs Level 1 .  Needs rest due to fatigue.           DGI: 11/24        BERG:     Sharlene Motts Balance Scale    SITTING TO STANDING (3)  INSTRUCTIONS: Please stand up. Try not to use your hand for support.  ( ) 4 able to stand without using hands and stabilize independently  ( ) 3 able to stand independently using hands  ( ) 2 able to stand using hands after several tries  ( ) 1 needs minimal aid to stand or stabilize  ( ) 0 needs moderate or maximal assist to stand    STANDING UNSUPPORTED (2)  INSTRUCTIONS: Please stand for two minutes without holding on.  ( ) 4 able to stand safely for 2 minutes  ( ) 3 able to stand 2 minutes with supervision  ( ) 2 able to stand 30 seconds unsupported  ( ) 1 needs several tries to stand 30 seconds unsupported  ( ) 0 unable to stand 30 seconds unsupported  If a subject is able to stand 2 minutes unsupported, score full points for sitting unsupported. Proceed to item #4.    SITTING WITH BACK UNSUPPORTED BUT FEET SUPPORTED ON FLOOR OR ON A STOOL 4  INSTRUCTIONS: Please sit with arms folded for 2 minutes.  ( ) 4 able to sit safely and securely for 2 minutes  ( ) 3 able to sit 2 minutes under supervision  ( ) 2 able to able to sit 30 seconds  ( ) 1 able to sit 10 seconds  ( ) 0 unable to sit without support 10 seconds    STANDING TO SITTING (3)  INSTRUCTIONS: Please sit down.  ( ) 4 sits safely with minimal use of hands  ( ) 3 controls descent by using hands  ( ) 2 uses back of legs against chair to control descent  ( ) 1 sits independently but has uncontrolled descent  ( ) 0 needs assist to  sit    TRANSFERS (2)  INSTRUCTIONS: Arrange chair(s) for pivot transfer. Ask subject to transfer one way toward a seat with armrests and one way  toward a seat without armrests. You may use two chairs (one with and one without armrests) or a bed and a chair.  ( ) 4 able to transfer safely with minor use of hands  ( ) 3 able to transfer safely definite need of hands  ( ) 2 able to transfer with verbal cuing and/or supervision  ( ) 1 needs one person to assist  ( ) 0 needs two people to assist or supervise to be safe    STANDING UNSUPPORTED WITH EYES CLOSED (3)  INSTRUCTIONS: Please close your eyes and stand still for 10 seconds.  ( ) 4 able to stand 10 seconds safely  ( ) 3 able to stand 10 seconds with supervision  ( ) 2 able to stand 3 seconds  ( ) 1 unable to keep eyes closed 3 seconds but stays safely  ( ) 0 needs help to keep from falling    STANDING UNSUPPORTED WITH FEET TOGETHER (3)  INSTRUCTIONS: Place your feet together and stand without holding on.  ( ) 4 able to place feet together independently and stand 1 minute safely  ( ) 3 able to place feet together independently and stand 1 minute with supervision  ( ) 2 able to place feet together independently but unable to hold for 30 seconds  ( ) 1 needs help to attain position but able to stand 15 seconds feet together  ( ) 0 needs help to attain position and unable to hold for 15 seconds    REACHING FORWARD WITH OUTSTRETCHED ARM WHILE STANDING (1)  INSTRUCTIONS: Lift arm to 90 degrees. Stretch out your fingers and reach forward as far as you can. (Examiner places a ruler at  the end of fingertips when arm is at 90 degrees. Fingers should not touch the ruler while reaching forward. The recorded measure is  the distance forward that the fingers reach while the subject is in the most forward lean position. When possible, ask subject to use  both arms when reaching to avoid rotation of the trunk.)  ( ) 4 can reach forward confidently 25 cm (10 inches)  ( ) 3 can reach forward 12 cm (5 inches)  ( ) 2 can reach forward 5 cm (2 inches)  ( ) 1 reaches forward but needs supervision  ( ) 0 loses balance while trying/requires external support    PICK UP OBJECT FROM THE FLOOR FROM A STANDING POSITION (1  INSTRUCTIONS: Pick up the shoe/slipper, which is in front of your feet.  ( ) 4 able to pick up slipper safely and easily  ( ) 3 able to pick up slipper but needs supervision  ( ) 2 unable to pick up but reaches 2-5 cm(1-2 inches) from slipper and keeps balance independently  ( ) 1 unable to pick up and needs supervision while trying  ( ) 0 unable to try/needs assist to keep from losing balance or falling    TURNING TO LOOK BEHIND OVER LEFT AND RIGHT SHOULDERS WHILE STANDING (2  INSTRUCTIONS: Turn to look directly behind you over toward the left shoulder. Repeat to the right. (Examiner may pick an object  to look at directly behind the subject to encourage a better twist turn.)  ( ) 4 looks behind from both sides and weight shifts well  ( ) 3 looks  behind one side only other side shows less weight shift  ( ) 2 turns sideways only but maintains balance  ( ) 1 needs supervision when turning  ( ) 0 needs assist to keep from losing balance or falling    TURN 360 DEGREES (2 INSTRUCTIONS: Turn completely around in a full circle. Pause. Then turn a full circle in the other direction.  ( ) 4 able to turn 360 degrees safely in 4 seconds or less  ( ) 3 able to turn 360 degrees safely one side only 4 seconds or less  ( ) 2 able to turn 360 degrees safely but slowly  ( ) 1 needs close supervision or verbal cuing  ( ) 0 needs assistance while turning    PLACE ALTERNATE FOOT ON STEP OR STOOL WHILE STANDING UNSUPPORTED (1)  INSTRUCTIONS: Place each foot alternately on the step/stool. Continue until each foot has touched the step/stool four times.  ( ) 4 able to stand independently and safely and complete 8 steps in 20 seconds  ( ) 3 able to stand independently and complete 8 steps in > 20 seconds  ( ) 2 able to complete 4 steps without aid with supervision  ( ) 1 able to complete > 2 steps needs minimal assist  ( ) 0 needs assistance to keep from falling/unable to try    STANDING UNSUPPORTED ONE FOOT IN FRONT (2  INSTRUCTIONS: (DEMONSTRATE TO SUBJECT) Place one foot directly in front of the other. If you feel that you cannot place  your foot directly in front, try to step far enough ahead that the heel of your forward foot is ahead of the toes of the other foot. (To  score 3 points, the length of the step should exceed the length of the other foot and the width of the stance should approximate the  subject???s normal stride width.)  ( ) 4 able to place foot tandem independently and hold 30 seconds  ( ) 3 able to place foot ahead independently and hold 30 seconds  ( ) 2 able to take small step independently and hold 30 seconds  ( ) 1 needs help to step but can hold 15 seconds  ( ) 0 loses balance while stepping or standing    STANDING ON ONE LEG (1)  INSTRUCTIONS: Stand on one leg as long as you can without holding on.  ( ) 4 able to lift leg independently and hold > 10 seconds  ( ) 3 able to lift leg independently and hold 5-10 seconds  ( ) 2 able to lift leg independently and hold L 3 seconds  ( ) 1 tries to lift leg unable to hold 3 seconds but remains standing independently.  ( ) 0 unable to try of needs assist to prevent fall    (30) TOTAL SCORE (Maximum = 56)  Interpretation:   41-56 = low fall risk  21-40 = medium fall risk  0 ???20 = high fall risk  A change of 8 points is required to reveal a genuine change in function between 2 assessments.    Functional Tests/Outcome Measures:   Berg 30/56  Medium fall risk    TUG:  18.56 sec     5 STS 20.21 sec     There Act: 15Trial of bilateral canes for support to assist with balance but patient lacks bilateral UE coordination and needs Mod A to coordinate.    Trial of  Rolling walker with improved ability  to  maintain balance and improved speed of gait.    NMR:  30  Balance exercise:    Standing feet together, Semi Tandem, Tandem, Airex stance with CGA .        Patient Education: Throughout evaluation patient educated regarding the following: Role of PT in Rehabilitation, HEP, importance of therapy, posture, treatment plan and Indications/Contraindications to Exercises. Patient demonstrated and verbalized agreement and understanding.        Communication/consultation with other professionals:  discussed POC with OT  discussed POC with PT     Total Time: 45 min     NMR: 30 min   There Act 15 min    I attest that I have reviewed the above information.  Signed: Vira Blanco, PT, DPT  03/22/2019 12:07 PM

## 2019-03-27 NOTE — Unmapped (Signed)
[facility]  [address] [place], [place] [address]    [phone]    [name] called to cancel his scheduled Physical Therapy follow-up session. Per voice message, no reason given for cancellation. Please contact me if you have any questions. Patient scheduled for Friday PT session.     SignedJohnsie Cancel, PT  03/27/2019 1:18 PM

## 2019-04-03 NOTE — Unmapped (Signed)
[facility]  [address] [place], [place] [address]    [phone]    [name] no-showed to his scheduled Physical Therapy follow-up session. Today was his 4th no-show to therapy and via mychart he has cancelled his outpatient OT appointments. This therapist called and left a voice message in regards to his attendance with cancelling the remaining PT appointments at this time or he can cancel through myChart. Would like to hear an update on his status to see if there are any concerns PT can assist with currently or if he would be able to make a priority for PT for a later date. Please contact me if you have any questions/concerns. Thank you.     SignedJohnsie Cancel, PT  04/03/2019 4:23 PM

## 2019-04-05 NOTE — Unmapped (Signed)
[facility]  [address] [place], [place] [address]    [phone]    [name]  Has missed 4 of his scheduled follow-up apts with PT.  I called [name] today to discuss this and got a voice message.  Explained in detail our no show policy and asked that patient call within 24 hours if he wishes to keep his remaining apts.  If we don't hear from him in the next 24 hours, his last treatment session will function as his discharge and patient will be removed from the schedule due to lack of compliance.   Please feel free to contact me if you have questions.       Thank you for this referral,     Signed: Vira Blanco, PT, DPT  04/05/2019 11:28 AM

## 2019-04-12 NOTE — Unmapped (Signed)
[facility] Refill Coordination Note    Specialty Medication(s) to be Shipped:   Inflammatory Disorders: Humira    Other medication(s) to be shipped:       [name], DOB: 1965/05/05  Phone: [phone] (home)       All above HIPAA information was verified with patient.     Was a Nurse, learning disability used for this call? No    Completed refill call assessment today to schedule patient's medication shipment from the [facility] [phone]).       Specialty medication(s) and dose(s) confirmed: Regimen is correct and unchanged.   Changes to medications: [name] reports no changes at this time.  Changes to insurance: No  Questions for the pharmacist: No    Confirmed patient received Welcome Packet with first shipment. The patient will receive a drug information handout for each medication shipped and additional FDA Medication Guides as required.       DISEASE/MEDICATION-SPECIFIC INFORMATION        For patients on injectable medications: Patient currently has 0 doses left.  Next injection is scheduled for 04/02.    SPECIALTY MEDICATION ADHERENCE     Medication Adherence    Patient reported X missed doses in the last month: 0  Specialty Medication: Humira CF 40 mg/0.4 ml  Patient is on additional specialty medications: No  Informant: patient  Reliability of informant: reliable  Patient is at risk for Non-Adherence: No                Humira 40/0.4 mg/ml: 0 days of medicine on hand         SHIPPING     Shipping address confirmed in Epic.     Delivery Scheduled: Yes, Expected medication delivery date: 03/29.     Medication will be delivered via Same Day Courier to the prescription address in Epic WAM.    Antonietta Barcelona   [facility] Specialty Technician

## 2019-04-17 MED FILL — HUMIRA SYRINGE CITRATE FREE 40 MG/0.4 ML: 28 days supply | Qty: 2 | Fill #7

## 2019-04-17 MED FILL — HUMIRA SYRINGE CITRATE FREE 40 MG/0.4 ML: 28 days supply | Qty: 2 | Fill #7 | Status: AC

## 2019-05-15 NOTE — Unmapped (Unsigned)
The [facility] has made a third and final attempt to reach this patient to refill the following medication:Humira .      We have left voicemails on the following phone numbers: [phone].    Dates contacted: 4/16 4/20 4/26  Last scheduled delivery: 04/17/2019    The patient may be at risk of non-compliance with this medication. The patient should call the [facility] at [phone] (option 4) to refill medication.    Ramina Hulet D Administrator [facility] Specialty Technician

## 2019-05-31 NOTE — Unmapped (Signed)
Patient left message requesting to be sen by Dr. [name] for C Diff exposure. Please call pt to schedule.

## 2019-05-31 NOTE — Unmapped (Signed)
Scheduled for Friday.  

## 2019-06-01 NOTE — Unmapped (Signed)
Patient called to inquire about getting his covid vaccine in clinic tomorrow during his visit. I informed the pateint that we do not administer the covid vaccine in our clinic and that he should proceed with the clinic that gave him his initial vaccine.

## 2019-06-02 NOTE — Unmapped (Signed)
[facility] Refill Coordination Note    Specialty Medication(s) to be Shipped:   Inflammatory Disorders: Humira    Other medication(s) to be shipped: n/a     Nicholas Rush, DOB: [birth year]  Phone: [phone] (home)       All above HIPAA information was verified with patient.     Was a Nurse, learning disability used for this call? No    Completed refill call assessment today to schedule patient's medication shipment from the [facility] [phone]).       Specialty medication(s) and dose(s) confirmed: Regimen is correct and unchanged.   Changes to medications: Nicholas Rush reports no changes at this time.  Changes to insurance: No  Questions for the pharmacist: No    Confirmed patient received Welcome Packet with first shipment. The patient will receive a drug information handout for each medication shipped and additional FDA Medication Guides as required.       DISEASE/MEDICATION-SPECIFIC INFORMATION        For patients on injectable medications: Patient currently has 0 doses left.  Next injection is scheduled for 06/02/19.    SPECIALTY MEDICATION ADHERENCE     Medication Adherence    Patient reported X missed doses in the last month: 0  Specialty Medication: Humira 40mg /0.9ml  Patient is on additional specialty medications: No  Informant: patient                  SHIPPING     Shipping address confirmed in Epic.     Delivery Scheduled: Yes, Expected medication delivery date: 06/05/19.     Medication will be delivered via Same Day Courier to the prescription address in Epic WAM.    Nicholas Rush   [facility] Specialty Technician

## 2019-06-05 MED FILL — HUMIRA SYRINGE CITRATE FREE 40 MG/0.4 ML: 28 days supply | Qty: 2 | Fill #8 | Status: AC

## 2019-06-05 MED FILL — HUMIRA SYRINGE CITRATE FREE 40 MG/0.4 ML: 28 days supply | Qty: 2 | Fill #8

## 2019-06-08 ENCOUNTER — Ambulatory Visit
Admit: 2019-06-08 | Discharge: 2019-06-11 | Disposition: A | Discharge status: Home Health | Payer: BLUE CROSS/BLUE SHIELD

## 2019-06-08 ENCOUNTER — Ambulatory Visit: Admit: 2019-06-08 | Discharge: 2019-06-11 | Disposition: A | Discharge status: Home Health | Payer: MEDICAID

## 2019-06-08 LAB — COMPREHENSIVE METABOLIC PANEL
ALBUMIN: 3.5 g/dL (ref 3.5–5.0)
ALKALINE PHOSPHATASE: 93 U/L (ref 38–126)
ALT (SGPT): 25 U/L (ref <50)
ANION GAP: 8 mmol/L (ref 7–15)
AST (SGOT): 60 U/L — ABNORMAL HIGH (ref 19–55)
BILIRUBIN TOTAL: 0.4 mg/dL (ref 0.0–1.2)
BLOOD UREA NITROGEN: 12 mg/dL (ref 7–21)
BUN / CREAT RATIO: 11
CHLORIDE: 99 mmol/L (ref 98–107)
CO2: 26 mmol/L (ref 22.0–30.0)
CREATININE: 1.1 mg/dL (ref 0.70–1.30)
EGFR CKD-EPI AA MALE: 88 mL/min/{1.73_m2} (ref >=60)
EGFR CKD-EPI NON-AA MALE: 76 mL/min/{1.73_m2} (ref >=60)
GLUCOSE RANDOM: 88 mg/dL (ref 70–179)
POTASSIUM: 3.9 mmol/L (ref 3.5–5.0)
PROTEIN TOTAL: 6.6 g/dL (ref 6.5–8.3)
SODIUM: 133 mmol/L — ABNORMAL LOW (ref 135–145)

## 2019-06-08 LAB — ETHANOL
ETHANOL: 143 mg/dL
Ethanol:MCnc:Pt:Ser/Plas:Qn:GC: 143

## 2019-06-08 LAB — AST (SGOT): Aspartate aminotransferase:CCnc:Pt:Ser/Plas:Qn:: 60 — ABNORMAL HIGH

## 2019-06-08 LAB — CBC W/ AUTO DIFF
BASOPHILS ABSOLUTE COUNT: 0 10*9/L (ref 0.0–0.1)
BASOPHILS RELATIVE PERCENT: 0.7 %
EOSINOPHILS RELATIVE PERCENT: 5 %
HEMATOCRIT: 38.3 % — ABNORMAL LOW (ref 41.0–53.0)
HEMOGLOBIN: 12.9 g/dL — ABNORMAL LOW (ref 13.5–17.5)
LARGE UNSTAINED CELLS: 4 % (ref 0–4)
LYMPHOCYTES ABSOLUTE COUNT: 1.6 10*9/L (ref 1.5–5.0)
LYMPHOCYTES RELATIVE PERCENT: 27.6 %
MEAN CORPUSCULAR HEMOGLOBIN CONC: 33.6 g/dL (ref 31.0–37.0)
MEAN CORPUSCULAR HEMOGLOBIN: 36.5 pg — ABNORMAL HIGH (ref 26.0–34.0)
MEAN CORPUSCULAR VOLUME: 108.8 fL — ABNORMAL HIGH (ref 80.0–100.0)
MONOCYTES ABSOLUTE COUNT: 0.4 10*9/L (ref 0.2–0.8)
MONOCYTES RELATIVE PERCENT: 6.2 %
NEUTROPHILS ABSOLUTE COUNT: 3.2 10*9/L (ref 2.0–7.5)
NEUTROPHILS RELATIVE PERCENT: 57 %
PLATELET COUNT: 159 10*9/L (ref 150–440)
RED BLOOD CELL COUNT: 3.52 10*12/L — ABNORMAL LOW (ref 4.50–5.90)
RED CELL DISTRIBUTION WIDTH: 15.5 % — ABNORMAL HIGH (ref 12.0–15.0)
WBC ADJUSTED: 5.6 10*9/L (ref 4.5–11.0)

## 2019-06-08 LAB — PROTIME-INR: INR: 0.9

## 2019-06-08 LAB — TROPONIN I
Troponin I.cardiac:MCnc:Pt:Ser/Plas:Qn:: <0.034
Troponin I.cardiac:MCnc:Pt:Ser/Plas:Qn:: <0.034

## 2019-06-08 LAB — SMEAR REVIEW

## 2019-06-08 LAB — MEAN CORPUSCULAR VOLUME: Erythrocyte mean corpuscular volume:EntVol:Pt:RBC:Qn:Automated count: 108.8 — ABNORMAL HIGH

## 2019-06-08 LAB — INR: Coagulation tissue factor induced.INR:RelTime:Pt:PPP:Qn:Coag: 0.9

## 2019-06-08 LAB — HEPARIN CORRELATION: Lab: 0.2

## 2019-06-09 LAB — BENZODIAZEPINE SCREEN, URINE: Lab: <200

## 2019-06-09 LAB — TOXICOLOGY SCREEN, URINE
BARBITURATE SCREEN URINE: <200
BENZODIAZEPINE SCREEN, URINE: <200
COCAINE(METAB.)SCREEN, URINE: <150
METHADONE SCREEN, URINE: <300
OPIATE SCREEN URINE: <300
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
## 2019-06-09 NOTE — Unmapped (Signed)
Report given to Sue RN
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
## 2019-06-10 NOTE — Unmapped (Signed)
Care Management  Initial Transition Planning Assessment              General  Care Manager assessed the patient by : Telephone conversation with family  Orientation Level: Oriented X4  Functional level prior to admission: Independent  Reason for referral: Discharge Planning    Contact/Decision [facility]  Extended Emergency Contact Information  Primary Emergency Contact: Bisbee,[name]  Address: [address] [place], [place] [address] [place] [employer] Phone: [phone]  Relation: Domestic Partner  Preferred language: ENGLISH  Interpreter needed? No    Legal Next of Kin / Guardian / POA / Advance Directives       Advance Directive (Medical Treatment)  Does patient have an advance directive covering medical treatment?: Patient does not have advance directive covering medical treatment.    Health Care Decision Maker [HCDM] (Medical & Mental Health Treatment)  Healthcare Decision Maker: Patient does not wish to appoint a Health Care Decision Maker at this time  Information offered on HCDM, Medical & Mental Health advance directives:: Patient declined information.     Patient Information  Lives with: Spouse/significant other, Children    Type of Residence: Private residence     Support Systems/Concerns: Significant Other    Responsibilities/Dependents at home?: No    Home Care services in place prior to admission?: No     Equipment Currently Used at Home: cane, straight, walker, standard, shower chair, raised toilet     Currently receiving outpatient dialysis?: No     Financial Information     Need for financial assistance?: No     Social Determinants of Health  Social Determinants of Health were addressed in provider documentation.  Please refer to patient history.    Discharge Needs Assessment  Concerns to be Addressed: no discharge needs identified    Clinical Risk Factors: Multiple Diagnoses (Chronic)    Barriers to taking medications: No    Prior overnight hospital stay or ED visit in last 90 days: No    Readmission Within the Last 30 Days: no previous admission in last 30 days     Anticipated Changes Related to Illness: none    Equipment Needed After Discharge: none    Discharge Facility/Level of Care Needs:      Readmission  Risk of Unplanned Readmission Score: UNPLANNED READMISSION SCORE: 9%  Predictive Model Details          9% (Low)  Factor Value    Calculated 06/10/2019 12:03 23% Number of active Rx orders 31    [place] Risk of Unplanned Readmission Model 21% Active NSAID Rx order present     12% Number of ED visits in last six months 2     10% ECG/EKG order present in last 6 months     7% Imaging order present in last 6 months     7% Latest hemoglobin low (12.9 g/dL)     5% Number of hospitalizations in last year 1     5% Active corticosteroid Rx order present     5% Age 54     2% Future appointment scheduled     1% Current length of stay 1.317 days     1% Active ulcer medication Rx order present      Readmitted Within the Last 30 Days? (No if blank)   Patient at risk for readmission?: No    Discharge Plan  Screen findings are: Care Manager reviewed the plan of  the patient's care with the Multidisciplinary Team. No discharge planning needs identified at this time. Care Manager will continue to manage plan and monitor patient's progress with the team.    Expected Discharge Date:     Expected Transfer from Critical Care:         Patient and/or family were provided with choice of facilities / services that are available and appropriate to meet post hospital care needs?: N/A       Initial Assessment complete?: Yes

## 2019-06-10 NOTE — Unmapped (Signed)
OCCUPATIONAL THERAPY  Evaluation (06/10/19 1040)    Patient Name:  [name]       Medical Record Number: [id number]   Date of Birth: [birth year]  Sex: Male          OT Treatment Diagnosis:  R UE orthopedic restrictions, decreased dymamic balance impacting ADl performance/safety    Assessment  Problem List: Decreased strength, Decreased endurance, Decreased mobility, Decreased coordination, Impaired vision, Impaired balance, Decreased safety awareness, Fall Risk, Impaired ADLs    Assessment: [name] is a 54 y.o. male with PMHx as noted below who presents to [facility] with Alcohol withdrawal syndrome, with delirium (CMS-HCC). At baseline, patient was ind. in ADLs,  IADLs, and  functional mobility. Patient is now limited by problem list as mentioned above, impacting ADL/functional mobility performance and safety. Pt requiring min. A for ADLs/low toilet t/f, reports family will assist at home. Patient would benefit from skilled OT services 2-3 times per week while at [facility], recommend continued skilled OT services 3 times per week upon discharge. After review of the patient's occupational profile and history, assessment of occupational performance, clinical decision making, and development of POC, the patient presents as a moderate complexity case.    Today's Interventions: OT evaluation, functional transfers, functional mobility, toilet transfer, upper body dressing, sitting tolerance and standing tolerance. Patient educated on role of OT, POC, safety, importance of RN/therapy assist with OOB mobility, benefits of early ADL engagement, fall prevention techniques (reducing clutter, removing throw rugs, adequate lighting, use of AD, seated ADL tasks), NWB R UE, importance of ROM at nonaffected joints, sling don/doff and appropriate fit, adaptive UB dressing strategies.    Activity Tolerance During Today's Session  Tolerated treatment well    Plan  Planned Frequency of Treatment:  1-2x per day for: 2-3x week Planned Interventions:  Adaptive equipment, ADL retraining, Balance activities, Bed mobility, Compensatory tech. training, Conservation, Functional cognition, Endurance activities, Education - Family / caregiver, Education - Patient, Functional mobility, Home exercise program, Passive range of motion, Positioning, Safety education, Range of motion, Transfer training, UE Strength / coordination exercise    Post-Discharge Occupational Therapy Recommendations:  OT Post Acute Discharge Recommendations: 3x weekly (assist from family)   OT DME Recommendations: None    GOALS:   Patient and Family Goals: Get home    Long Term Goal #1: Pt will score 24/24 on AMPAC raw score in 3 weeks       Short Term:  Pt will complete toilet t/f and toileting tasks ind.   Time Frame : 2 weeks  Pt will complete UB dressing with adaptive strategies and mod-I   Time Frame : 2 weeks  Pt will complete 2+ standing ADLs ind.   Time Frame : 2 weeks                  Prognosis:  Good  Positive Indicators:  family support  Barriers to Discharge: Endurance deficits, Impaired Balance, Other (orthopedic restrictions)    Subjective  Current Status Pt left/rec;d EOB, all needs met, call bell within reach, Rn updated  Prior Functional Status PTA, pt reports being grossly ind. with ADLs/fx mobility/IADls/driving. No falls recently.            Patient / Caregiver reports: My GF can help me at home, should be fine    Past Medical History:   Diagnosis Date   ??? Anxiety    ??? Depressive disorder    ??? Erectile dysfunction    ???  Esophageal reflux    ??? History of pyloric stenosis    ??? Hypertension    ??? Insomnia    ??? Psoriasis     Dermatologist - Dr. Deloria Lair in [facility]   ??? Seizure (CMS-HCC) 02/20/2019   ??? Tobacco use     1ppd    Social History     Tobacco Use   ??? Smoking status: Current Every Day Smoker     Packs/day: 1.00     Years: 30.00     Pack years: 30.00     Types: Cigarettes   ??? Smokeless tobacco: Never Used   Substance Use Topics   ??? Alcohol use: Yes Alcohol/week: 42.0 standard drinks     Types: 42 Cans of beer per week     Comment: 6 pack every day      Past Surgical History:   Procedure Laterality Date   ??? PYLOROMYOTOMY     ??? WISDOM TOOTH EXTRACTION      Family History   Problem Relation Age of Onset   ??? Bipolar disorder Mother    ??? Depression Sister    ??? Alcohol abuse Sister    ??? Alcohol abuse Maternal Aunt    ??? Melanoma Neg Hx    ??? Basal cell carcinoma Neg Hx    ??? Squamous cell carcinoma Neg Hx         Hydroxyzine     Objective Findings  Precautions / Restrictions  Falls precautions    Weight Bearing  LUE NWB - Non weight bearing    Required Braces or Orthoses  Sling    Communication Preference  Verbal    Pain  Pt c/o pain near clavicle injury, no rating provided, Educated on NWB and use of sling.    Equipment / Environment  Patient wearing mask for full session, Telemetry, Vascular access (PIV, TLC, Port-a-cath, PICC)    Living Situation  Living Environment: Mobile home  Lives With: Family  Home Living: One level home, Ramped entrance, Garden tub, Standard height toilet, Tub bench, Bedside commode  Equipment available at home: Rolling Walker, Tub bench, Bedside commode     Cognition   Orientation Level:  Oriented x 4   Arousal/Alertness:  Appropriate responses to stimuli   Attention Span:  Appears intact   Memory:  Appears intact   Following Commands:  Follows all commands and directions without difficulty   Safety Judgment:  Good awareness of safety precautions   Awareness of Errors:  Good awareness of safety precautions   Problem Solving:  Able to problem solve independently   Comments:      Vision / Perception        Vision: No visual deficits          Hand Function  Hand Dominance: R  B gross grasp WFL but decreased FMC noted    Skin Inspection  c/d/i    ROM / Strength/Coordination  UE ROM/ Strength/ Coordination: R UE ROM/strength: WFL; L UE: NT  LE ROM/ Strength/ Coordination: B LE ROm/strength: appear WFL    Sensation:  intact    Balance:  sitting: supervision; standing without AD: SBA    Functional Mobility  Transfer Assistance Needed: Yes  Transfers - Needs Assistance: Standby assist, Min assist (min.A from low toilet; SBA for sit <> stand)  Bed Mobility Assistance Needed:  (not assessed)  Ambulation: fx mobility within room: SBA without AD      ADLs  ADLs: Needs assistance with ADLs  ADLs - Needs Assistance: Bathing,  LB dressing, UB dressing, Toileting, Feeding, Grooming  Feeding - Needs Assistance: Set Up Assist  Grooming - Needs Assistance: Min assist  Bathing - Needs Assistance: Min assist  Toileting - Needs Assistance: Min assist  UB Dressing - Needs Assistance: Min assist  LB Dressing - Needs Assistance: Min assist  IADLs: NT      Vitals / Orthostatics  At Rest: HR 120 EOB  With Activity: increasedt o 145-150 with movement,MD notified  Orthostatics: increased WOb with activity      Medical Staff Made Aware: Rn aware      Occupational Therapy Session Duration  OT Individual [mins]: 15         I attest that I have reviewed the above information.  Signed: Merril Abbe, OT  Ceasar Mons 06/10/2019
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
## 2019-06-10 NOTE — Unmapped (Addendum)
Patient has been resting and sleeping well in bed since the shift change. Patient took his HS medications with water. Patient's bed linen and gown changed. Vitals stable. CIWA protocols continued and maintained. No pain verbalized thus far. Patient kept clean and dry. Bed alarm on for safety. Call bell, phone, water in reach. Skin, falls, pain, enteric, safety protocols continued and reviewed. Patient is sleeping at this time. Continue to monitor and support.   Problem: Infection  Goal: Infection Symptom Resolution  Outcome: Ongoing - Unchanged     Problem: Hypertension Comorbidity  Goal: Blood Pressure in Desired Range  Outcome: Ongoing - Unchanged     Problem: Adult Inpatient Plan of Care  Goal: Plan of Care Review  06/10/2019 0046 by Cleta Alberts, RN  Outcome: Ongoing - Unchanged  06/10/2019 0046 by Cleta Alberts, RN  Flowsheets (Taken 06/10/2019 [phone])  Progress: no change  Plan of Care Reviewed With: patient  Goal: Patient-Specific Goal (Individualization)  Outcome: Ongoing - Unchanged  Goal: Absence of Hospital-Acquired Illness or Injury  Outcome: Ongoing - Unchanged  Goal: Optimal Comfort and Wellbeing  Outcome: Ongoing - Unchanged  Goal: Readiness for Transition of Care  Outcome: Ongoing - Unchanged  Goal: Rounds/Family Conference  Outcome: Ongoing - Unchanged     Problem: Fall Injury Risk  Goal: Absence of Fall and Fall-Related Injury  Outcome: Ongoing - Unchanged     Problem: Self-Care Deficit  Goal: Improved Ability to Complete Activities of Daily Living  Outcome: Ongoing - Unchanged

## 2019-06-10 NOTE — Unmapped (Signed)
Patient is resting in bed. No distress noted. Patient denies pain. VS WNL. Call bell within reach. Scheduled meds given. Enteric Precautions maintained. Patient is doing well, denies N/V, pain, anxiety, or any other symptoms. Will continue to monitor patient.   Problem: Infection  Goal: Infection Symptom Resolution  Outcome: Progressing     Problem: Hypertension Comorbidity  Goal: Blood Pressure in Desired Range  Outcome: Progressing     Problem: Adult Inpatient Plan of Care  Goal: Plan of Care Review  Outcome: Progressing  Goal: Patient-Specific Goal (Individualization)  Outcome: Progressing  Goal: Absence of Hospital-Acquired Illness or Injury  Outcome: Progressing  Goal: Optimal Comfort and Wellbeing  Outcome: Progressing  Goal: Readiness for Transition of Care  Outcome: Progressing  Goal: Rounds/Family Conference  Outcome: Progressing     Problem: Fall Injury Risk  Goal: Absence of Fall and Fall-Related Injury  Outcome: Progressing     Problem: Self-Care Deficit  Goal: Improved Ability to Complete Activities of Daily Living  Outcome: Progressing

## 2019-06-10 NOTE — Unmapped (Signed)
PHYSICAL THERAPY        Patient Name:  Nicholas Rush       Medical Record Number: [id number]   Date of Birth: 01/19/1966  Sex: Male            Treatment Diagnosis: gait dysfunction/ataxia    ASSESSMENT  Problem List: Impaired balance, Fall Risk, Decreased endurance, Gait deviation, Decreased coordination     Assessment : Nicholas Rush is a 54 y.o. male with PMHx as noted below who presents to [facility] with Alcohol withdrawal syndrome, with delirium (CMS-HCC). Pt presents with above deficits impacting functional mobility. Pt reports caregiver at home will be able to assist with mobility and ADL's as needed. Pt verbalized understanding of education provided regarding NWB L UE and sling use. Pt will benefit from 3x at dc and support from GF at dc. After a review of the personal factors, comorbidities, clinical presentation, and examination of the number of affected body systems, the patient presents as a mod complexity case.     Today's Interventions: mobility and vitals assessment, education on role of PT, POC, importance of mobility      PLAN  Planned Frequency of Treatment:  1-2x per day for: 3-4x week      Planned Interventions: Balance activities, Endurance activities, Therapeutic exercise, Therapeutic activity, Education - Patient, Functional mobility, Self-care / Home training    Post-Discharge Physical Therapy Recommendations:  3x weekly    PT DME Recommendations: None           Goals:   Patient and Family Goals: dc home    Long Term Goal #1: Pt will ambulate >500 feet with LRAD and mod (I) in 6 weeks       SHORT GOAL #1: Pt will perform all functional transfers mod (I)              Time Frame : 2 weeks  SHORT GOAL #2: Pt will ambulate 200 feet with LRAD and mod (I)              Time Frame : 2 weeks    Prognosis:  Guarded     Barriers to Discharge: Endurance deficits, Impaired Balance, Other (orthopedic restrictions)    SUBJECTIVE  Patient reports: I'm doing pretty good. I just take it slow and if I feel dizzy, I just sit down.  Current Functional Status: Pt seen for PT eval on . Pt received reclined in bed, was left seated EOB with OT, all needs in reach.     Prior Functional Status: (I) driving, reports no falls. Feels he can tell when he is too dizzy and then he will sit down.  Equipment available at home: [employer], Tub bench, Bedside commode     Past Medical History:   Diagnosis Date   ??? Anxiety    ??? Depressive disorder    ??? Erectile dysfunction    ??? Esophageal reflux    ??? History of pyloric stenosis    ??? Hypertension    ??? Insomnia    ??? Psoriasis     Dermatologist - Dr. Deloria Lair in [facility]   ??? Seizure (CMS-HCC) 02/20/2019   ??? Tobacco use     1ppd    Social History     Tobacco Use   ??? Smoking status: Current Every Day Smoker     Packs/day: 1.00     Years: 30.00     Pack years: 30.00     Types: Cigarettes   ??? Smokeless tobacco:  Never Used   Substance Use Topics   ??? Alcohol use: Yes     Alcohol/week: 42.0 standard drinks     Types: 42 Cans of beer per week     Comment: 6 pack every day      Past Surgical History:   Procedure Laterality Date   ??? PYLOROMYOTOMY     ??? WISDOM TOOTH EXTRACTION      Family History   Problem Relation Age of Onset   ??? Bipolar disorder Mother    ??? Depression Sister    ??? Alcohol abuse Sister    ??? Alcohol abuse Maternal Aunt    ??? Melanoma Neg Hx    ??? Basal cell carcinoma Neg Hx    ??? Squamous cell carcinoma Neg Hx         Allergies: Hydroxyzine     Objective Findings  Precautions / Restrictions  Precautions: Falls precautions  Weight Bearing Status: LUE NWB - Non weight bearing  Required Braces or Orthoses: Sling    Communication Preference: Verbal   Pain Comments: L shoulder pain, unrated  Medical Tests / Procedures: reviewed per epic  Equipment / Environment: Patient not wearing mask for full session, Vascular access (PIV, TLC, Port-a-cath, PICC)    At Rest: VSS  With Activity: HR 130 with sitting EOB, up to 150 with ambulation around room.  Orthostatics: denies       Living Situation  Living Environment: Mobile home  Lives With: Family  Home Living: One level home, Ramped entrance, Garden tub, Standard height toilet, Tub bench, Bedside commode           Skin Inspection: Visible skin appears intact       UE comment: L UE NT due to sling and fracture, R UE grossly WFL for mobility     LE comment: Grossly WFL for ROM and strength                Coordination: ataxia B LE         Balance: good sitting balanace, fair-good standing         Bed Mobility: CGA with increased time and HOB slightly elevated. Pt required increased time due to pain and decreased use of L UE during tranfer  Transfers: sit<>stand SBA from bed, minA from low commode- pt had difficulty with transfer as grab bar on L side and unable to use L UE to assist   Gait  Level of Assistance: Standby assist, set-up cues, supervision of patient - no hands on  Assistive Device: None  Distance Ambulated (ft): 30 ft  Gait: Pt ambulates around room without AD. Pt ambulates with wide BOS, increased lateral trunk sway with increased candence and decreased step length B. gait unsteady but no LOB noted.         Endurance: fair- SOB with ambulation    Physical Therapy Session Duration  PT Individual [mins]: 25 (overlap with Deneise Lever OT for dc pending)    Medical Staff Made Aware: RN and MD cleared pt to work with PT and were updated    I attest that I have reviewed the above information.  Signed: Gillermo Murdoch, PT  Filed 06/10/2019
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
## 2019-06-11 LAB — EGFR CKD-EPI AA MALE: Glomerular filtration rate/1.73 sq M.predicted.black:ArVRat:Pt:Ser/Plas/Bld:Qn:Creatinine-based formula (CKD-EPI): >90

## 2019-06-11 LAB — BASIC METABOLIC PANEL
ANION GAP: 3 mmol/L — ABNORMAL LOW (ref 7–15)
BLOOD UREA NITROGEN: 9 mg/dL (ref 7–21)
BUN / CREAT RATIO: 14
CHLORIDE: 97 mmol/L — ABNORMAL LOW (ref 98–107)
CO2: 30 mmol/L (ref 22.0–30.0)
CREATININE: 0.66 mg/dL — ABNORMAL LOW (ref 0.70–1.30)
EGFR CKD-EPI NON-AA MALE: >90 mL/min/{1.73_m2} (ref >=60)
GLUCOSE RANDOM: 107 mg/dL (ref 70–179)
POTASSIUM: 4.9 mmol/L (ref 3.5–5.0)
SODIUM: 130 mmol/L — ABNORMAL LOW (ref 135–145)

## 2019-06-11 LAB — MAGNESIUM: Magnesium:MCnc:Pt:Ser/Plas:Qn:: 1 — ABNORMAL LOW

## 2019-06-11 LAB — PHOSPHORUS: Phosphate:MCnc:Pt:Ser/Plas:Qn:: 4.6

## 2019-06-11 MED ORDER — LISINOPRIL 10 MG TABLET
ORAL_TABLET | Freq: Every day | ORAL | 0 refills | 30.00000 days | Status: CP
Start: 2019-06-11 — End: 2019-07-11
  Filled 2019-06-11: qty 60, 30d supply, fill #0

## 2019-06-11 MED ORDER — VANCOMYCIN 25 MG/ML ORAL SOLUTION
Freq: Four times a day (QID) | ORAL | 0 refills | 15 days | Status: CP
Start: 2019-06-11 — End: 2019-06-20
  Filled 2019-06-11: qty 300, 15d supply, fill #0

## 2019-06-11 MED FILL — LIDOCAINE 5 % TOPICAL PATCH: 3 days supply | Qty: 3 | Fill #0 | Status: AC

## 2019-06-11 MED FILL — LISINOPRIL 10 MG TABLET: 30 days supply | Qty: 60 | Fill #0 | Status: AC

## 2019-06-11 MED FILL — FIRVANQ 25 MG/ML ORAL SOLUTION: 15 days supply | Qty: 300 | Fill #0 | Status: AC
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
## 2019-06-11 NOTE — Unmapped (Addendum)
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 on 5/23 and the encounter was related to the primary reason for home health care.    Call MD for:  difficulty breathing, headache or visual disturbances      Call MD for:  persistent dizziness or light-headedness  Call MD for:  persistent nausea or vomiting      Call MD for:  severe uncontrolled pain      Discharge instructions      Discharge Instruction for Alcohol Abuse/Misuse    Alcohol withdrawal is a group of symptoms that occur when you drink alcohol daily and suddenly stop. It can begin within 5 hours of your last drink and get worse over 2 to 3 days. Withdrawal may also happen if you suddenly reduce the amount of alcohol that you normally drink.    What is naltrexone?  Your provider may have had a discussion with you about nalrexone and may have prescribed this for you at discharge.    Naltrexone blocks the effects of opioid medication, including pain relief or feelings of well-being that can lead to opioid abuse. An opioid is sometimes called a narcotic. Naltrexone is used as part of a treatment program for drug or alcohol dependence.  Naltrexone is used to prevent relapse in people who became dependent on opioid medicine and then stopped using it. Naltrexone can help keep you from feeling a need to use the opioid. Naltrexone is also used to treat alcohol abuse by reducing your urge to drink alcohol. This may help you drink less or stop drinking completely. Naltrexone will not cause you to sober up and will not decrease the effects of alcohol you recently consumed.  Naltrexone is not a cure for drug addiction or alcohol abuse.    Substance Abuse Resources  -- Alcoholics Anonymous: [phone]. Kendell Bane: [phone] [phone]  -- [facility]: (Now has intensive out-pt treatment as well as halfway house) 609-130-5291 or 925-716-4048  -- [facility] s Alcohol and Substance Abuse Treatment Program (ASAP) [phone], or [phone]  -- [facility]: (Long-term residential treatment program located in [place] at [employer]. Requires patient to call themselves to make inquiry) [phone]    - if you need to establish a Primary Care Doctor you may call the Internal Medicine Clinic for an appointment:  3RD [facility] INTERNAL MEDICINE APPOINTMENT LINE: [phone] OR [phone]    -- You may also call the [facility] clinic for an appt.    [employer] support all six of the community health centers as well as [employer]: [phone].    - You can call the PSYCHIATRY CLINIC ADULT at [phone] for an appointment to help you manage your stress and anxiety better. They are located on the 1ST FLOOR of the [facility].          Appointments which have been scheduled for you    Jul 07, 2019  4:00 PM  (Arrive by 3:45 PM)  FOLLOW UP with Mar Daring, MD  [facility] PEDS AND INTERNAL [facility]ICINE [place] [facility]) [address]  [place] [address]  [phone]           ______________________________________________________________________  Discharge Day Services:  BP 152/100  - Pulse 90  - Temp 37 ??C (98.6 ??F) (Oral)  - Resp 18  - Ht 185.4 cm (6' 1)  - Wt 85.9 kg (189 lb 6 oz)  - SpO2 99%  - BMI 24.99 kg/m??   Pt seen on the day of discharge and determined appropriate for discharge.    Condition at Discharge: good    Length of Discharge: I spent greater than 30 mins in the discharge of this patient.

## 2019-06-11 NOTE — Unmapped (Signed)
[facility] [phone] PT/OT eval and treat. Start of care by 06/14/19

## 2019-06-11 NOTE — Unmapped (Signed)
Problem: Infection  Goal: Infection Symptom Resolution  Outcome: Discharged to Home     Problem: Hypertension Comorbidity  Goal: Blood Pressure in Desired Range  Outcome: Discharged to Home     Problem: Adult Inpatient Plan of Care  Goal: Plan of Care Review  Outcome: Discharged to Home  Goal: Patient-Specific Goal (Individualization)  Outcome: Discharged to Home  Goal: Absence of Hospital-Acquired Illness or Injury  Outcome: Discharged to Home  Goal: Optimal Comfort and Wellbeing  Outcome: Discharged to Home  Goal: Readiness for Transition of Care  Outcome: Discharged to Home  Goal: Rounds/Family Conference  Outcome: Discharged to Home     Problem: Fall Injury Risk  Goal: Absence of Fall and Fall-Related Injury  Outcome: Discharged to Home     Problem: Self-Care Deficit  Goal: Improved Ability to Complete Activities of Daily Living  Outcome: Discharged to Home

## 2019-06-11 NOTE — Unmapped (Signed)
No acute events over night. Patient has been alert and oriented throughout the night. Most of this vitals have been stable and within acceptable limits. His blood pressure was elevated to 135/103 or 151/107. Night hospitalist informed. No interventions ordered as patient is chronically hypertensive and currently on blood pressure medication so no new interventions were ordered. He has been adherent with medication therapies. Left arm sling in place. He denied pain requiring medication. CIWA scored every 4 hours. He has not scored greater than a 0 all night. He remains on Enteric Precautions. Remains free of falls and injuries. Plan of care continued.       Problem: Adult Inpatient Plan of Care  Goal: Plan of Care Review  Outcome: Progressing  Goal: Patient-Specific Goal (Individualization)  Outcome: Progressing  Goal: Absence of Hospital-Acquired Illness or Injury  Outcome: Progressing  Goal: Optimal Comfort and Wellbeing  Outcome: Progressing  Goal: Readiness for Transition of Care  Outcome: Progressing  Goal: Rounds/Family Conference  Outcome: Progressing     Problem: Fall Injury Risk  Goal: Absence of Fall and Fall-Related Injury  Outcome: Progressing     Problem: Self-Care Deficit  Goal: Improved Ability to Complete Activities of Daily Living  Outcome: Progressing     Problem: Infection  Goal: Infection Symptom Resolution  Outcome: Progressing     Problem: Hypertension Comorbidity  Goal: Blood Pressure in Desired Range  Outcome: Not Progressing

## 2019-06-12 MED ORDER — LIDOCAINE 5 % TOPICAL PATCH
MEDICATED_PATCH | Freq: Every day | TRANSDERMAL | 0 refills | 14 days | Status: CP
Start: 2019-06-12 — End: 2019-06-26
  Filled 2019-06-11: qty 3, 3d supply, fill #0

## 2019-06-14 ENCOUNTER — Encounter: Admit: 2019-06-14 | Discharge: 2019-07-07 | Payer: MEDICAID

## 2019-06-14 ENCOUNTER — Inpatient Hospital Stay: Admit: 2019-06-14 | Discharge: 2019-07-07 | Payer: BLUE CROSS/BLUE SHIELD

## 2019-06-15 DIAGNOSIS — S42009A Fracture of unspecified part of unspecified clavicle, initial encounter for closed fracture: Principal | ICD-10-CM
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
## 2019-06-27 NOTE — Unmapped (Signed)
[facility] Refill Coordination Note    Specialty Medication(s) to be Shipped:   Inflammatory Disorders: Humira    Other medication(s) to be shipped: none     Nicholas Rush, DOB: 1965/04/16  Phone: [phone] (home)       All above HIPAA information was verified with patient.     Was a Nurse, learning disability used for this call? No    Completed refill call assessment today to schedule patient's medication shipment from the [facility] [phone]).       Specialty medication(s) and dose(s) confirmed: Regimen is correct and unchanged.   Changes to medications: [name] reports no changes at this time.  Changes to insurance: No  Questions for the pharmacist: No    Confirmed patient received Welcome Packet with first shipment. The patient will receive a drug information handout for each medication shipped and additional FDA Medication Guides as required.       DISEASE/MEDICATION-SPECIFIC INFORMATION        For patients on injectable medications: Patient currently has 1 doses left.  Next injection is scheduled for PT unaware.Marland Kitchen    SPECIALTY MEDICATION ADHERENCE     Medication Adherence    Patient reported X missed doses in the last month: 0  Specialty Medication: Humira CF 40 mg/0.4 ml  Patient is on additional specialty medications: No          Humira: .1 dose on hand        SHIPPING     Shipping address confirmed in Epic.     Delivery Scheduled: Yes, Expected medication delivery date: 07/03/19.     Medication will be delivered via Same Day Courier to the prescription address in Epic WAM.    [place] A Dillan Lunden   [facility] Shared [facility] Specialty Technician

## 2019-06-28 NOTE — Unmapped (Signed)
Patient rescheduled for the 14th.

## 2019-07-03 ENCOUNTER — Ambulatory Visit: Admit: 2019-07-03 | Discharge: 2019-07-04 | Payer: MEDICAID | Attending: Pediatrics | Primary: Pediatrics

## 2019-07-03 DIAGNOSIS — N50811 Right testicular pain: Principal | ICD-10-CM

## 2019-07-03 DIAGNOSIS — F1020 Alcohol dependence, uncomplicated: Principal | ICD-10-CM

## 2019-07-03 DIAGNOSIS — A0472 Enterocolitis due to Clostridium difficile, not specified as recurrent: Principal | ICD-10-CM

## 2019-07-03 DIAGNOSIS — I10 Essential (primary) hypertension: Principal | ICD-10-CM

## 2019-07-03 MED ORDER — VANCOMYCIN 125 MG CAPSULE
ORAL_CAPSULE | 0 refills | 0 days | Status: CP
Start: 2019-07-03 — End: ?

## 2019-07-03 MED FILL — HUMIRA SYRINGE CITRATE FREE 40 MG/0.4 ML: 28 days supply | Qty: 2 | Fill #9

## 2019-07-03 MED FILL — HUMIRA SYRINGE CITRATE FREE 40 MG/0.4 ML: 28 days supply | Qty: 2 | Fill #9 | Status: AC

## 2019-07-04 ENCOUNTER — Ambulatory Visit: Admit: 2019-07-04 | Discharge: 2019-07-05 | Payer: MEDICAID

## 2019-07-04 ENCOUNTER — Encounter: Admit: 2019-07-04 | Discharge: 2019-07-05 | Payer: MEDICAID
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
## 2019-07-04 NOTE — Unmapped (Signed)
Testicular ultrasound      c diff treatment
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
## 2019-07-10 NOTE — Unmapped (Addendum)
8:45am-- Patient left message returning your phone call.    3:53pm--Patient left message returning your phone call. He stated that its regarding some very important paperwork.

## 2019-07-11 MED ORDER — PEG-ELECTROLYTE SOLUTION 420 GRAM ORAL SOLUTION
0 refills | 0.00000 days | Status: CP
Start: 2019-07-11 — End: ?

## 2019-07-19 MED FILL — PEG-ELECTROLYTE SOLUTION 420 GRAM ORAL SOLUTION: 1 days supply | Qty: 4000 | Fill #0

## 2019-07-19 MED FILL — PEG-ELECTROLYTE SOLUTION 420 GRAM ORAL SOLUTION: 1 days supply | Qty: 4000 | Fill #0 | Status: AC

## 2019-07-25 ENCOUNTER — Ambulatory Visit: Admit: 2019-07-25 | Discharge: 2019-07-26 | Payer: MEDICAID

## 2019-08-07 NOTE — Unmapped (Signed)
The [facility] has made a third and final attempt to reach this patient to refill the following medication:Humira.      We have left voicemails on the following phone numbers: [phone] and [phone].    Dates contacted: 7/7 7/13 7/19  Last scheduled delivery: 6/14    The patient may be at risk of non-compliance with this medication. The patient should call the [facility] at [phone] (option 4) to refill medication.    Mitcheal Sweetin D Administrator Shared [facility] Specialty Technician

## 2019-08-08 NOTE — Unmapped (Signed)
[facility] Refill Coordination Note    Specialty Medication(s) to be Shipped:   Inflammatory Disorders: Humira    Other medication(s) to be shipped: none     Nicholas Rush, DOB: 1965-11-30  Phone: [phone] (home)       All above HIPAA information was verified with patient.     Was a Nurse, learning disability used for this call? No    Completed refill call assessment today to schedule patient's medication shipment from the [facility] [phone]).       Specialty medication(s) and dose(s) confirmed: Regimen is correct and unchanged.   Changes to medications: [name] reports no changes at this time.  Changes to insurance: No  Questions for the pharmacist: No    Confirmed patient received Welcome Packet with first shipment. The patient will receive a drug information handout for each medication shipped and additional FDA Medication Guides as required.       DISEASE/MEDICATION-SPECIFIC INFORMATION        For patients on injectable medications: Patient currently has 0 doses left.  Next injection is scheduled for 08/11/2019.    SPECIALTY MEDICATION ADHERENCE     Medication Adherence    Patient reported X missed doses in the last month: 0  Specialty Medication: Humira CF 40 mg/0.4 ml  Patient is on additional specialty medications: No  Any gaps in refill history greater than 2 weeks in the last 3 months: no  Demonstrates understanding of importance of adherence: yes  Informant: patient  Reliability of informant: reliable  Confirmed plan for next specialty medication refill: delivery by pharmacy  Refills needed for supportive medications: not needed                  SHIPPING     Shipping address confirmed in Epic.     Delivery Scheduled: Yes, Expected medication delivery date: 08/10/2019.     Medication will be delivered via Same Day Courier to the prescription address in Epic WAM.    Garyn Waguespack D Seve Monette   [facility] Specialty Technician

## 2019-08-10 MED FILL — HUMIRA SYRINGE CITRATE FREE 40 MG/0.4 ML: 28 days supply | Qty: 2 | Fill #10

## 2019-08-10 MED FILL — HUMIRA SYRINGE CITRATE FREE 40 MG/0.4 ML: 28 days supply | Qty: 2 | Fill #10 | Status: AC

## 2019-08-14 DIAGNOSIS — A0472 Enterocolitis due to Clostridium difficile, not specified as recurrent: Principal | ICD-10-CM

## 2019-08-14 MED ORDER — VANCOMYCIN 125 MG CAPSULE: capsule | 0 refills | 0 days | Status: AC

## 2019-08-14 MED ORDER — VANCOMYCIN 125 MG CAPSULE
ORAL_CAPSULE | ORAL | 0 refills | 0.00000 days | Status: CP
Start: 2019-08-14 — End: ?

## 2019-08-15 MED ORDER — VANCOMYCIN 125 MG CAPSULE
ORAL_CAPSULE | 0 refills | 0 days
Start: 2019-08-15 — End: ?

## 2019-08-15 NOTE — Unmapped (Signed)
Filled yesterday

## 2019-08-17 DIAGNOSIS — A0472 Enterocolitis due to Clostridium difficile, not specified as recurrent: Principal | ICD-10-CM

## 2019-08-17 MED ORDER — VANCOMYCIN 125 MG CAPSULE
ORAL_CAPSULE | ORAL | 0 refills | 0.00000 days | Status: CP
Start: 2019-08-17 — End: 2019-08-17

## 2019-08-17 MED ORDER — VANCOMYCIN 125 MG CAPSULE: capsule | 0 refills | 0 days | Status: AC

## 2019-08-18 DIAGNOSIS — A0472 Enterocolitis due to Clostridium difficile, not specified as recurrent: Principal | ICD-10-CM

## 2019-08-18 MED ORDER — VANCOMYCIN 125 MG CAPSULE
ORAL_CAPSULE | 0 refills | 0.00000 days
Start: 2019-08-18 — End: ?

## 2019-08-18 NOTE — Unmapped (Signed)
PLEASE RESEND VANCOMYCIN TO [facility]  ON [address] IN [place]

## 2019-08-31 DIAGNOSIS — L409 Psoriasis, unspecified: Principal | ICD-10-CM

## 2019-08-31 MED ORDER — HUMIRA SYRINGE CITRATE FREE 40 MG/0.4 ML
11 refills | 0.00000 days
Start: 2019-08-31 — End: ?

## 2019-09-01 NOTE — Unmapped (Signed)
Called patient to schd an appt for medication refill had to LVM    Thanks  [facility]

## 2019-09-11 NOTE — Unmapped (Signed)
Talked to patient about his Humira to inform him refill was denied by MD.  He needs to make an appointment before it will be approved.  I will set up another call in two weeks to check status.  He also knows to call us if approved earlier.

## 2019-09-19 DIAGNOSIS — L409 Psoriasis, unspecified: Principal | ICD-10-CM

## 2019-09-19 MED ORDER — ADALIMUMAB SYRINGE CITRATE FREE 40 MG/0.4 ML
SUBCUTANEOUS | 1 refills | 0.00000 days | Status: CP
Start: 2019-09-19 — End: ?
  Filled 2019-10-09: qty 2, 28d supply, fill #0

## 2019-09-19 NOTE — Unmapped (Signed)
Hello Dr. Mingo Rush this was a patient of Nicholas Rush he needs a refill on Humira but his appt is in October, so wants a refill to hold him until then. Thanks!

## 2019-09-21 DIAGNOSIS — L409 Psoriasis, unspecified: Principal | ICD-10-CM

## 2019-09-22 NOTE — Unmapped (Signed)
Patients wife called requesting referral so that patient can be evaluated for fecal transplant due to c.diff

## 2019-09-26 NOTE — Unmapped (Signed)
Nicholas Rush 's Humira shipment will be delayed as a result of prior authorization now approved.     I have reached out to the patient and left a voicemail message.  We will wait for a call back from the patient to reschedule the delivery.  We have not confirmed the new delivery date.

## 2019-10-06 MED ORDER — METHOCARBAMOL 750 MG TABLET
Freq: Three times a day (TID) | ORAL | 0 days | Status: SS
Start: 2019-10-06 — End: ?

## 2019-10-09 MED FILL — HUMIRA SYRINGE CITRATE FREE 40 MG/0.4 ML: 28 days supply | Qty: 2 | Fill #0 | Status: AC

## 2019-10-09 NOTE — Unmapped (Signed)
Nicholas Rush missed two doses of Humira while waiting to make an appt with MD.  He was approved for 2 refills until his appt in October.  He has noticeable flares which have not been controled by topical steroid.  He will receive his next dose of Humira today and will start right away.           [facility] Clinical Assessment & Refill Coordination Note    Nicholas Rush, DOB: Jun 01, 1965  Phone: [phone] (home)     All above HIPAA information was verified with patient.     Was a Nurse, learning disability used for this call? No    Specialty Medication(s):   Inflammatory Disorders: Humira     Current Outpatient Medications   Medication Sig Dispense Refill   ??? ADALIMUMAB SYRINGE CITRATE FREE 40 MG/0.4 ML Inject the contents of 1 syringe (40 mg) under the skin every 14 days 4 each 1   ??? calcium carbonate/vitamin D3 (CALCIUM 500 + D ORAL) Take 1 tablet by mouth daily.     ??? cetirizine (ZYRTEC) 10 MG tablet Take 10 mg by mouth daily.     ??? cyclobenzaprine (FLEXERIL) 10 MG tablet Take 10 mg by mouth Three (3) times a day as needed for muscle spasms.      ??? empty container Misc Use as directed to dispose of Humira syringes. 1 each 2   ??? fluticasone propionate (FLONASE) 50 mcg/actuation nasal spray 2 sprays by Each Nare route daily. 16 g 12   ??? folic acid (FOLVITE) 1 MG tablet Take 1 mg by mouth daily.     ??? halobetasol (ULTRAVATE) 0.05 % ointment Apply topically Two (2) times a day. To affected areas as needed. 60 g 10   ??? ibuprofen (ADVIL,MOTRIN) 200 MG tablet Take 200 mg by mouth every six (6) hours as needed. takes 2 to 4 tabs as needed for pain     ??? lisinopriL (PRINIVIL,ZESTRIL) 10 MG tablet Take 2 tablets (20 mg total) by mouth daily. 60 tablet 0   ??? magnesium oxide (MAG-OX) 400 mg (241.3 mg magnesium) tablet Take 1 tablet (400 mg total) by mouth daily. 120 tablet 0   ??? multivitamin,tx-iron-minerals (THERA-M) Tab Take 1 tablet by mouth daily.     ??? nicotine (NICODERM CQ) 21 mg/24 hr patch Place 1 patch on the skin daily. Remove old patch before applying new one. 28 patch 0   ??? omeprazole (PRILOSEC) 20 MG capsule TAKE 1 CAPSULE(20MG  TOTAL) BY MOUTH EVERY DAY 90 capsule 1   ??? peg-electrolyte soln (NULYTELY) 420 gram SolR Take as directed per instruction sheet from [facility] provider. 4000 mL 0   ??? POTASSIUM GLUCONATE ORAL Take 1 tablet by mouth daily. takes 550 mg     ??? pseudoephedrine (SUDAFED) 30 MG tablet Take 30 mg by mouth daily as needed. allergies     ??? pyridoxine, vitamin B6, (VITAMIN B-6) 100 MG tablet Take 100 mg by mouth daily.      ??? sildenafiL, pulm.hypertension, (REVATIO) 20 mg tablet 2-5 tabs prior to intercourse as needed 30 tablet 5   ??? thiamine (B-1) 100 MG tablet Take 1 tablet (100 mg total) by mouth daily. 100 tablet 3   ??? vancomycin (VANCOCIN) 125 MG capsule Take 1 capsule (125mg ) by mouth 4 times daily for 10 days. Then 1 capsule twice daily for 1 week. Then 1 capsule daily for 1 week. Then 1 capsule every 3 days for 4 weeks. 75 capsule 0  No current facility-administered medications for this visit.        Changes to medications: Nicholas Rush reports no changes at this time.    Allergies   Allergen Reactions   ??? Hydroxyzine Itching     Sedation.  Ineffective for reducing anxiety.       Changes to allergies: No    SPECIALTY MEDICATION ADHERENCE     Humira 40/0.4 mg/ml: 0 days of medicine on hand       Medication Adherence    Patient reported X missed doses in the last month: 2  Specialty Medication: Humira 40 mg/0.4 ml  Patient is on additional specialty medications: No  Informant: patient  Confirmed plan for next specialty medication refill: delivery by pharmacy  Refills needed for supportive medications: not needed          Specialty medication(s) dose(s) confirmed: Regimen is correct and unchanged.     Are there any concerns with adherence? No    Adherence counseling provided? Not needed    CLINICAL MANAGEMENT AND INTERVENTION      Clinical Benefit Assessment:    Do you feel the medicine is effective or helping your condition? Yes    Clinical Benefit counseling provided? Not needed    Adverse Effects Assessment:    Are you experiencing any side effects? No    Are you experiencing difficulty administering your medicine? No    Quality of Life Assessment:    How many days over the past month did your psoriasis  keep you from your normal activities? For example, brushing your teeth or getting up in the morning. 0    Have you discussed this with your provider? Not needed    Therapy Appropriateness:    Is therapy appropriate? Yes, therapy is appropriate and should be continued    DISEASE/MEDICATION-SPECIFIC INFORMATION      For patients on injectable medications: Patient currently has 0 doses left.  Next injection is scheduled for ASAP.    PATIENT SPECIFIC NEEDS     - Does the patient have any physical, cognitive, or cultural barriers? No    - Is the patient high risk? No    - Does the patient require a Care Management Plan? No     - Does the patient require physician intervention or other additional services (i.e. nutrition, smoking cessation, social work)? No      SHIPPING     Specialty Medication(s) to be Shipped:   Inflammatory Disorders: Humira    Other medication(s) to be shipped: No additional medications requested for fill at this time     Changes to insurance: No    Delivery Scheduled: Yes, Expected medication delivery date: 10/09/19.     Medication will be delivered via Same Day Courier to the confirmed prescription address in [facility].    The patient will receive a drug information handout for each medication shipped and additional FDA Medication Guides as required.  Verified that patient has previously received a Conservation officer, historic buildings.    All of the patient's questions and concerns have been addressed.    Gabriel Paulding Vangie Bicker   [facility] Specialty Pharmacist

## 2019-10-09 NOTE — Unmapped (Signed)
Patient left message requesting a return call. Patient stated that he received corresponding information from the [facility] and they are revoking his license. Patient stated that giving up his license is not an option and he would like to discuss that with you.

## 2019-10-10 MED ORDER — TRAMADOL 50 MG TABLET
0 days | Status: SS
Start: 2019-10-10 — End: ?

## 2019-10-10 NOTE — Unmapped (Signed)
Patient is requesting the following refill  Requested Prescriptions     Pending Prescriptions Disp Refills   ??? lisinopriL (PRINIVIL,ZESTRIL) 10 MG tablet 60 tablet 0     Sig: Take 2 tablets (20 mg total) by mouth daily.           Last OV: 07/03/2019   Next OV: Visit date not found

## 2019-10-11 MED ORDER — LISINOPRIL 20 MG TABLET
ORAL_TABLET | Freq: Every day | ORAL | 10 refills | 30.00000 days | Status: CP
Start: 2019-10-11 — End: 2019-11-10
  Filled 2019-11-28: qty 30, 30d supply, fill #0

## 2019-10-12 NOTE — Unmapped (Signed)
Patient requesting a refill of Librium

## 2019-10-13 NOTE — Unmapped (Signed)
Left message for patient to make appointment.

## 2019-10-14 DIAGNOSIS — L409 Psoriasis, unspecified: Principal | ICD-10-CM

## 2019-10-14 MED ORDER — SILDENAFIL (PULMONARY HYPERTENSION) 20 MG TABLET
ORAL_TABLET | 5 refills | 0 days
Start: 2019-10-14 — End: ?

## 2019-10-14 MED ORDER — HALOBETASOL PROPIONATE 0.05 % TOPICAL OINTMENT
Freq: Two times a day (BID) | TOPICAL | 10 refills | 0.00000 days
Start: 2019-10-14 — End: ?

## 2019-10-14 MED ORDER — FLUTICASONE PROPIONATE 50 MCG/ACTUATION NASAL SPRAY,SUSPENSION
Freq: Every day | NASAL | 12 refills | 0 days
Start: 2019-10-14 — End: ?

## 2019-10-14 NOTE — Unmapped (Signed)
Recent:   What is the date of your last related visit? 9.20.21 Telephone for Rx refill   Related acute medications Rx'd: Lisinopril 20 mg daily  Home treatment tried:  NA  COVID Vaccine:     Relevant:   Allergies: Hydroxyzine  Medications: Lisinopril  Health History: HTN  Weight: NA    [facility] [phone] - [address] [place], [place] - [address] [place] [facility] [address]  P: [phone]  F: [phone]  Address    [address] [place]   [place] [address]   Store number: [phone]   Near the intersection of: [address]    Operation       Hours: Not open 24 hours   E-Prescribing   E-Prescribing controlled substances   Mode: Retail   Type: External          Reason for Disposition  ??? [1] Prescription prescribed recently is not at pharmacy AND [2] triager has access to patient's EMR AND [3] prescription is recorded in the EMR    Answer Assessment - Initial Assessment Questions  1. NAME of MEDICATION: What medicine are you calling about?      Lisinopril  2. QUESTION: What is your question? (e.g., medication refill, side effect)      Not at pharmacy  3. PRESCRIBING HCP: Who prescribed it? Reason: if prescribed by specialist, call should be referred to that group.      Dr. [name]  4. SYMPTOMS: Do you have any symptoms?      NA  5. SEVERITY: If symptoms are present, ask Are they mild, moderate or severe?     NA  6. PREGNANCY:  Is there any chance that you are pregnant? When was your last menstrual period?     NA    Protocols used: MEDICATION QUESTION CALL-A-AH

## 2019-10-16 MED ORDER — FLUTICASONE PROPIONATE 50 MCG/ACTUATION NASAL SPRAY,SUSPENSION
Freq: Every day | NASAL | 12 refills | 0 days | Status: CP
Start: 2019-10-16 — End: ?

## 2019-10-17 NOTE — Unmapped (Signed)
Refilled one tube of medication. Patient to see Dr. Domenic Schwab October 19 and should get new prescription there.

## 2019-10-19 ENCOUNTER — Ambulatory Visit
Admit: 2019-10-19 | Discharge: 2019-11-03 | Disposition: A | Discharge status: Home Health | Payer: MEDICAID | Admitting: Allergy

## 2019-10-19 DIAGNOSIS — D539 Nutritional anemia, unspecified: Secondary | ICD-10-CM | POA: Insufficient documentation

## 2019-10-19 DIAGNOSIS — E871 Hypo-osmolality and hyponatremia: Secondary | ICD-10-CM

## 2019-10-19 DIAGNOSIS — D696 Thrombocytopenia, unspecified: Secondary | ICD-10-CM

## 2019-10-19 LAB — SLIDE REVIEW

## 2019-10-19 LAB — COMPREHENSIVE METABOLIC PANEL
ALBUMIN: 3.4 g/dL (ref 3.4–5.0)
ALKALINE PHOSPHATASE: 161 U/L — ABNORMAL HIGH (ref 46–116)
ALT (SGPT): 33 U/L (ref 10–49)
ANION GAP: 9 mmol/L (ref 5–14)
BILIRUBIN TOTAL: 1 mg/dL (ref 0.3–1.2)
BLOOD UREA NITROGEN: 6 mg/dL — ABNORMAL LOW (ref 9–23)
CALCIUM: 9.2 mg/dL (ref 8.7–10.4)
CHLORIDE: 91 mmol/L — ABNORMAL LOW (ref 98–107)
CO2: 25 mmol/L (ref 20.0–31.0)
CREATININE: 0.89 mg/dL
EGFR CKD-EPI AA MALE: >90 mL/min/{1.73_m2} (ref >=60)
EGFR CKD-EPI NON-AA MALE: >90 mL/min/{1.73_m2} (ref >=60)
GLUCOSE RANDOM: 96 mg/dL (ref 70–179)
POTASSIUM: 3.8 mmol/L (ref 3.4–4.5)
PROTEIN TOTAL: 7.4 g/dL (ref 5.7–8.2)
SODIUM: 125 mmol/L — ABNORMAL LOW (ref 135–145)

## 2019-10-19 LAB — CBC W/ AUTO DIFF
BASOPHILS ABSOLUTE COUNT: 0 10*9/L (ref 0.0–0.1)
BASOPHILS RELATIVE PERCENT: 0.2 %
EOSINOPHILS ABSOLUTE COUNT: 0.1 10*9/L (ref 0.0–0.4)
EOSINOPHILS RELATIVE PERCENT: 1.8 %
HEMATOCRIT: 37.2 % — ABNORMAL LOW (ref 41.0–53.0)
HEMOGLOBIN: 13 g/dL — ABNORMAL LOW (ref 13.5–17.5)
LARGE UNSTAINED CELLS: 1 % (ref 0–4)
LYMPHOCYTES ABSOLUTE COUNT: 0.4 10*9/L — ABNORMAL LOW (ref 1.5–5.0)
LYMPHOCYTES RELATIVE PERCENT: 8.1 %
MEAN CORPUSCULAR HEMOGLOBIN CONC: 34.8 g/dL (ref 31.0–37.0)
MEAN CORPUSCULAR HEMOGLOBIN: 38.7 pg — ABNORMAL HIGH (ref 26.0–34.0)
MEAN PLATELET VOLUME: 9.4 fL (ref 7.0–10.0)
NEUTROPHILS ABSOLUTE COUNT: 4.6 10*9/L (ref 2.0–7.5)
NEUTROPHILS RELATIVE PERCENT: 85 %
PLATELET COUNT: 141 10*9/L — ABNORMAL LOW (ref 150–440)
RED BLOOD CELL COUNT: 3.35 10*12/L — ABNORMAL LOW (ref 4.50–5.90)
RED CELL DISTRIBUTION WIDTH: 13.5 % (ref 12.0–15.0)
WBC ADJUSTED: 5.4 10*9/L (ref 4.5–11.0)

## 2019-10-19 LAB — POTASSIUM: Potassium:SCnc:Pt:Ser/Plas:Qn:: 3.8

## 2019-10-19 LAB — MAGNESIUM: Magnesium:MCnc:Pt:Ser/Plas:Qn:: 1.5 — ABNORMAL LOW

## 2019-10-19 LAB — MONOCYTES ABSOLUTE COUNT: Monocytes:NCnc:Pt:Bld:Qn:Automated count: 0.2

## 2019-10-19 LAB — PHOSPHORUS: Phosphate:MCnc:Pt:Ser/Plas:Qn:: 2.8

## 2019-10-19 LAB — ETHANOL: Ethanol:MCnc:Pt:Ser/Plas:Qn:GC: <10

## 2019-10-19 LAB — EXTRA TUBE GREEN LI HEP: Lab: 0

## 2019-10-19 LAB — GREEN LITHIUM HEPARIN EXTRA TUBE

## 2019-10-19 LAB — TOXIC VACUOLATION

## 2019-10-20 LAB — COMPREHENSIVE METABOLIC PANEL
ALKALINE PHOSPHATASE: 139 U/L — ABNORMAL HIGH (ref 46–116)
ALT (SGPT): 30 U/L (ref 10–49)
ANION GAP: 9 mmol/L (ref 5–14)
AST (SGOT): 56 U/L — ABNORMAL HIGH (ref <=34)
BILIRUBIN TOTAL: 1 mg/dL (ref 0.3–1.2)
BLOOD UREA NITROGEN: 7 mg/dL — ABNORMAL LOW (ref 9–23)
BUN / CREAT RATIO: 10
CALCIUM: 8.2 mg/dL — ABNORMAL LOW (ref 8.7–10.4)
CO2: 25 mmol/L (ref 20.0–31.0)
CREATININE: 0.67 mg/dL — ABNORMAL LOW
EGFR CKD-EPI AA MALE: >90 mL/min/{1.73_m2} (ref >=60)
EGFR CKD-EPI NON-AA MALE: >90 mL/min/{1.73_m2} (ref >=60)
GLUCOSE RANDOM: 66 mg/dL — ABNORMAL LOW (ref 70–179)
POTASSIUM: 3.3 mmol/L — ABNORMAL LOW (ref 3.4–4.5)
PROTEIN TOTAL: 6.3 g/dL (ref 5.7–8.2)
SODIUM: 129 mmol/L — ABNORMAL LOW (ref 135–145)

## 2019-10-20 LAB — BASIC METABOLIC PANEL
BLOOD UREA NITROGEN: 6 mg/dL — ABNORMAL LOW (ref 9–23)
CALCIUM: 7.2 mg/dL — ABNORMAL LOW (ref 8.7–10.4)
CHLORIDE: 102 mmol/L (ref 98–107)
CO2: 23 mmol/L (ref 20.0–31.0)
CREATININE: 0.59 mg/dL — ABNORMAL LOW
EGFR CKD-EPI AA MALE: >90 mL/min/{1.73_m2} (ref >=60)
EGFR CKD-EPI NON-AA MALE: >90 mL/min/{1.73_m2} (ref >=60)
GLUCOSE RANDOM: 71 mg/dL (ref 70–179)
SODIUM: 132 mmol/L — ABNORMAL LOW (ref 135–145)

## 2019-10-20 LAB — CBC
HEMATOCRIT: 34.4 % — ABNORMAL LOW (ref 41.0–53.0)
HEMOGLOBIN: 11.4 g/dL — ABNORMAL LOW (ref 13.5–17.5)
MEAN CORPUSCULAR HEMOGLOBIN: 37.2 pg — ABNORMAL HIGH (ref 26.0–34.0)
MEAN CORPUSCULAR VOLUME: 111.8 fL — ABNORMAL HIGH (ref 80.0–100.0)
PLATELET COUNT: 124 10*9/L — ABNORMAL LOW (ref 150–440)
RED BLOOD CELL COUNT: 3.07 10*12/L — ABNORMAL LOW (ref 4.50–5.90)
RED CELL DISTRIBUTION WIDTH: 13.7 % (ref 12.0–15.0)
WBC ADJUSTED: 4.1 10*9/L — ABNORMAL LOW (ref 4.5–11.0)

## 2019-10-20 LAB — MEAN CORPUSCULAR HEMOGLOBIN CONC: Erythrocyte mean corpuscular hemoglobin concentration:MCnc:Pt:RBC:Qn:Automated count: 33.2

## 2019-10-20 LAB — CHLORIDE: Chloride:SCnc:Pt:Ser/Plas:Qn:: 95 — ABNORMAL LOW

## 2019-10-20 LAB — POTASSIUM: Potassium:SCnc:Pt:Ser/Plas:Qn:: 3 — ABNORMAL LOW

## 2019-10-20 LAB — GLUCOSE RANDOM: Glucose:MCnc:Pt:Ser/Plas:Qn:: 71

## 2019-10-20 LAB — MAGNESIUM: Magnesium:MCnc:Pt:Ser/Plas:Qn:: 1.5 — ABNORMAL LOW

## 2019-10-20 MED ORDER — NICOTINE 21 MG/24 HR DAILY TRANSDERMAL PATCH
MEDICATED_PATCH | Freq: Every day | TRANSDERMAL | 0 refills | 28.00000 days
Start: 2019-10-20 — End: ?

## 2019-10-20 MED ADMIN — chlordiazePOXIDE (LIBRIUM) capsule 25 mg: 25 mg | ORAL | @ 03:00:00

## 2019-10-20 MED ADMIN — magnesium sulfate in D5W 1 gram/100 mL infusion 1 g: 1 g | INTRAVENOUS | @ 16:00:00 | Stop: 2019-10-20

## 2019-10-20 MED ADMIN — lisinopriL (PRINIVIL,ZESTRIL) tablet 20 mg: 20 mg | ORAL | @ 12:00:00

## 2019-10-20 MED ADMIN — LORazepam (ATIVAN) 2 mg/mL injection: INTRAVENOUS | Stop: 2019-10-19

## 2019-10-20 MED ADMIN — potassium chloride (KLOR-CON) CR tablet 40 mEq: 40 meq | ORAL | @ 18:00:00 | Stop: 2019-10-20

## 2019-10-20 MED ADMIN — thiamine (B-1) injection 100 mg: 100 mg | INTRAVENOUS | @ 01:00:00 | Stop: 2019-10-19

## 2019-10-20 MED ADMIN — thiamine (B-1) 500 mg in sodium chloride (NS) 0.9 % 100 mL IVPB: 500 mg | INTRAVENOUS | @ 22:00:00 | Stop: 2019-10-23

## 2019-10-20 MED ADMIN — folic acid injection: 1 mg | INTRAVENOUS | @ 01:00:00 | Stop: 2019-10-19

## 2019-10-20 MED ADMIN — thiamine (B-1) 500 mg in sodium chloride (NS) 0.9 % 100 mL IVPB: 500 mg | INTRAVENOUS | @ 10:00:00 | Stop: 2019-10-23

## 2019-10-20 MED ADMIN — folic acid (FOLVITE) tablet 1 mg: 1 mg | ORAL | @ 12:00:00

## 2019-10-20 MED ADMIN — sodium chloride (NS) 0.9 % infusion: 100 mL/h | INTRAVENOUS | @ 13:00:00

## 2019-10-20 MED ADMIN — multivitamins, therapeutic with minerals tablet 1 tablet: 1 | ORAL | @ 01:00:00 | Stop: 2019-10-19

## 2019-10-20 MED ADMIN — LORazepam (ATIVAN) injection 2 mg: 2 mg | INTRAVENOUS | Stop: 2019-10-19

## 2019-10-20 MED ADMIN — magnesium sulfate in D5W 1 gram/100 mL infusion 1 g: 1 g | INTRAVENOUS | @ 14:00:00 | Stop: 2019-10-20

## 2019-10-20 MED ADMIN — chlordiazePOXIDE (LIBRIUM) capsule 25 mg: 25 mg | ORAL | @ 12:00:00

## 2019-10-20 MED ADMIN — magnesium oxide (MAG-OX) tablet 400 mg: 400 mg | ORAL | @ 03:00:00 | Stop: 2019-10-19

## 2019-10-20 MED ADMIN — chlordiazePOXIDE (LIBRIUM) capsule 25 mg: 25 mg | ORAL | @ 18:00:00

## 2019-10-20 MED ADMIN — sodium chloride (NS) 0.9 % infusion: 125 mL/h | INTRAVENOUS | @ 01:00:00 | Stop: 2019-10-20

## 2019-10-20 NOTE — Unmapped (Signed)
General Medicine History and Physical    Assessment/Plan:    Principal Problem:    Alcohol withdrawal seizure (CMS-HCC)  Active Problems:    Alcohol withdrawal syndrome, with delirium (CMS-HCC)    Essential hypertension    Thrombocytopenia (CMS-HCC)    Macrocytic anemia    Hyponatremia    Hypomagnesemia      Nicholas Rush is a 54 y.o. male with PMHx alcohol use disorder, complicated alcohol withdrawals with seizures, HTN, anemia that presented to [facility] with Alcohol withdrawal seizure (CMS-HCC).     Seizure 2/2 alcohol withdrawal - severe alcohol use disorder: Current every day alcohol consumption, most recent drink 9/30 around 1000. Patient with multiple witnessed seizures today, does appear consistent with true seizure given reports of loss of consciousness, tonic clonic motions, and post-ictal period. Given multiple seizures today will admit to stepdown level of care.  - continue CIWA with prn IV ativan  - scheduled librium 25mg  TID with plan for taper  - thiamine/folate supplementation    Hyponatremia: Na 125 on admission. Likely secondary to alcohol use with low solute intake. Also possible that patient's citalopram is contributing.   - hold home citalopram  - currently receiving IV NS for the next 8 hours  - monitor    HTN: continue home lisinopril    Macrocytic anemia: appears chronic in setting of alcohol use.   - monitor    Thrombocytopenia: appears to be chronic in setting of alcohol use.    ___________________________________________________________________    Chief Complaint:  Chief Complaint   Patient presents with   ??? Seizure - Prior History Of     Alcohol withdrawal seizure (CMS-HCC)    HPI:  Nicholas Rush is a 54 y.o. male with PMHx alcohol use disorder, complicated alcohol withdrawals with seizures, HTN, anemia that presented to [facility] with Alcohol withdrawal seizure (CMS-HCC). He was brought into the ED via EMS after his girlfriend witnessed tonic clonic activity, reportedly patient had another seizure en route to the ED. While in the ED around 2000 patient had another witnessed seizure and was given 2mg  IV ativan.     Patient states his last drink was around 1000 this AM. He tells me that he drinks on the weekends, and some weekdays. He is unable to tell me how much alcohol he drinks at a time. Per ED notes patient's girlfriend endorses that he drinking 15-20 1.5oz shot bottles of alcohol a day, and recently has been drinking a fifth of liquor over the past few days. Patient has been hospitalized previously for alcohol withdrawal, most recently 05/2019.     Patient states he has been feeling dizzy while in the ED. Denies chest pain, shortness of breath, abdominal pain, vision changes.      Allergies:  Hydroxyzine    Medications:   Prior to Admission medications    Medication Dose, Route, Frequency   ADALIMUMAB SYRINGE CITRATE FREE 40 MG/0.4 ML Inject the contents of 1 syringe (40 mg) under the skin every 14 days   calcium carbonate/vitamin D3 (CALCIUM 500 + D ORAL) 1 tablet, Oral, Daily   cetirizine (ZYRTEC) 10 MG tablet 10 mg, Oral, Daily (standard)   cyclobenzaprine (FLEXERIL) 10 MG tablet 10 mg, Oral, 3 times a day PRN   empty container Misc Use as directed to dispose of Humira syringes.   fluticasone propionate (FLONASE) 50 mcg/actuation nasal spray 2 sprays, Each Nare, Daily (standard)   folic acid (FOLVITE) 1 MG tablet 1 mg, Oral, Daily (standard)  halobetasol (ULTRAVATE) 0.05 % ointment Topical, 2 times a day (standard), To affected areas as needed.   ibuprofen (ADVIL,MOTRIN) 200 MG tablet 200 mg, Oral, Every 6 hours PRN, takes 2 to 4 tabs as needed for pain   lisinopriL (PRINIVIL,ZESTRIL) 20 MG tablet 20 mg, Oral, Daily (standard)   magnesium oxide (MAG-OX) 400 mg (241.3 mg magnesium) tablet 400 mg, Oral, Daily (standard)   multivitamin,tx-iron-minerals (THERA-M) Tab 1 tablet, Oral, Daily   nicotine (NICODERM CQ) 21 mg/24 hr patch Place 1 patch on the skin daily. Remove old patch before applying new one.   omeprazole (PRILOSEC) 20 MG capsule TAKE 1 CAPSULE(20MG  TOTAL) BY MOUTH EVERY DAY   peg-electrolyte soln (NULYTELY) 420 gram SolR Take as directed per instruction sheet from [facility] provider.   POTASSIUM GLUCONATE ORAL 1 tablet, Oral, Daily, takes 550 mg   pseudoephedrine (SUDAFED) 30 MG tablet 30 mg, Oral, Daily PRN, allergies   pyridoxine, vitamin B6, (VITAMIN B-6) 100 MG tablet 100 mg, Oral, Daily (standard)   sildenafiL, pulm.hypertension, (REVATIO) 20 mg tablet 2-5 tabs prior to intercourse as needed   thiamine (B-1) 100 MG tablet 100 mg, Oral, Daily (standard)   vancomycin (VANCOCIN) 125 MG capsule Take 1 capsule (125mg ) by mouth 4 times daily for 10 days. Then 1 capsule twice daily for 1 week. Then 1 capsule daily for 1 week. Then 1 capsule every 3 days for 4 weeks.       Medical History:  Past Medical History:   Diagnosis Date   ??? Anxiety    ??? Depressive disorder    ??? Erectile dysfunction    ??? Esophageal reflux    ??? History of pyloric stenosis    ??? Hypertension    ??? Insomnia    ??? Psoriasis     Dermatologist - Dr. Deloria Lair in [facility]   ??? Seizure (CMS-HCC) 02/20/2019   ??? Tobacco use     1ppd       Surgical History:  Past Surgical History:   Procedure Laterality Date   ??? PYLOROMYOTOMY     ??? WISDOM TOOTH EXTRACTION         Social History:  Social History     Socioeconomic History   ??? Marital status: Media planner     Spouse name: Not on file   ??? Number of children: Not on file   ??? Years of education: Not on file   ??? Highest education level: Not on file   Occupational History   ??? Not on file   Tobacco Use   ??? Smoking status: Current Every Day Smoker     Packs/day: 1.00     Years: 30.00     Pack years: 30.00     Types: Cigarettes   ??? Smokeless tobacco: Never Used   Substance and Sexual Activity   ??? Alcohol use: Yes     Alcohol/week: 42.0 standard drinks     Types: 42 Cans of beer per week     Comment: 6 pack every day   ??? Drug use: No   ??? Sexual activity: Not on file   Other Topics Concern   ??? Do you use sunscreen? Yes   ??? Tanning bed use? Yes   ??? Are you easily burned? Yes   ??? Excessive sun exposure? No   ??? Blistering sunburns? Yes   Social History Narrative    Lives in [place] with wife and two children.  Works [employer].  Current smoker.  Alcohol intake is excessive.  On some  days it's 12 beers.                 Social Determinants of Health     Financial Resource Strain:    ??? Difficulty of Paying Living Expenses:    Food Insecurity: No Food Insecurity   ??? Worried About Programme researcher, broadcasting/film/video in the Last Year: Never true   ??? Ran Out of Food in the Last Year: Never true   Transportation Needs:    ??? Lack of Transportation (Medical):    ??? Lack of Transportation (Non-Medical):    Physical Activity:    ??? Days of Exercise per Week:    ??? Minutes of Exercise per Session:    Stress:    ??? Feeling of Stress :    Social Connections:    ??? Frequency of Communication with Friends and Family:    ??? Frequency of Social Gatherings with Friends and Family:    ??? Attends Religious Services:    ??? Database administrator or Organizations:    ??? Attends Engineer, structural:    ??? Marital Status:         Alcohol Use:    ??? How often do you have a drink containing alcohol?:    ??? How many drinks containing alcohol do you have on a typical day when you are drinking?:    ??? How often do you have 5 or more drinks on one occasion?:        Family History:  Family History   Problem Relation Age of Onset   ??? Bipolar disorder Mother    ??? Depression Sister    ??? Alcohol abuse Sister    ??? Alcohol abuse Maternal Aunt    ??? Melanoma Neg Hx    ??? Basal cell carcinoma Neg Hx    ??? Squamous cell carcinoma Neg Hx        Review of Systems:  10 systems reviewed and are negative unless otherwise mentioned in HPI    Labs/Studies:  Labs and Studies from the last 24hrs per EMR and Reviewed    Physical Exam:  Temp:  [36.8 ??C (98.2 ??F)] 36.8 ??C (98.2 ??F)  Heart Rate:  [88] 88  SpO2 Pulse:  [88] 88  Resp:  [14] 14  BP: (150)/(85) 150/85  SpO2:  [97 %] 97 %    General: disheveled, laying in bed, no acute distress  HEENT: anisocoria with L pupil > right pupil, both pupils reactive to light. EOMI. Sclerae anicteric and without injection. Oropharynx moist.   NECK: trachea midline and mobile, no submandibular or cervical lymphadenopathy  RESP: CTAB, no increased work of breathing on room air  CV: Regular rate and rhythm. Normal S1 and S2. No murmurs or gallops.   ABD: NABS, soft, non-distended, non-tender to palpation  EXT: No lower extremity edema, cyanosis, or clubbing. Posterior tibial pulses are 2+ and symmetric.   MSK: No edema, normal range of motion  SKIN: No rashes or lesions  NEURO: No focal neurologic deficits, alert, oriented to self and place

## 2019-10-20 NOTE — Unmapped (Signed)
[facility]  Emergency Department Provider Note       ED Clinical Impression     Final diagnoses:   Alcohol withdrawal seizure with complication (CMS-HCC) (Primary)        Impression, ED Course, Assessment and Plan     Impression: Nicholas Rush is a 54yo M here with complications of alcohol abuse. Brought in by EMS for witnessed tonic clonic seizure and post-ictal state. History of seizures, not on anti-epileptic medication. Last drink this morning. Differential = alcohol intoxication, alcohol withdrawal, epilepsy, electrolyte abnormality, drug overdose. Placed on CIWA protocol and ordered CBC, CMP, Mg, Phos, and EtOH level as well as an EKG. Gave B1+B9 with NS. Will likely require admission 2/2 seizure activity.       8:43 PM - Witnessed tonic clonic seizure at 20:28, 2mg  ativan administered IV. Post-ictal for aprox 10 mins. Bite injury noted to bottom lip, bleeding controlled. No loss of continence. MAO paged for admission.  EtOH <10. Seizure likely withdrawal vs acute intoxication.    CIWA-AR Total Score: 9     9:30 PM - Patient requires frequent redirecting to stay in bed but not combative. Admission pending bed assignment.    12:55AM - Na+ = 132; up from 125 on arrival       Additional Medical Decision Making     I have reviewed the vital signs and the nursing notes. Labs and radiology results that were available during my care of the patient were independently reviewed by me and considered in my medical decision making.         History     Chief Complaint  Seizure - Prior History Of      History of Present Illness   Nicholas Rush is a 54 y.o. male with hx of complications from alcohol withdrawal here post seizure.    Patient brought in by EMS who was called by girlfriend for witnessed tonic clonic activity lasting 90sec. EMS arrived and patient had another seizure and was post -ictal and very tremulous for 10 mins. EMS gave NS and 2.5mg  Versed in route.     Patient denies pain, headache, nausea and vomiting, recent falls. Last drink was this morning. Girlfriend endorses history of 15-20 1.5oz shot bottles of alcohol a day and a fifth of liquor over the past few days.  He has had 2 prior seizures this week . Last hospital admission for withdrawal complications in 5/21 for 1 week. Denies hx of DT's. Denies drug use. Current smoker. Denied SI/HI.    Past Medical History:   Diagnosis Date   ??? Anxiety    ??? Depressive disorder    ??? Erectile dysfunction    ??? Esophageal reflux    ??? History of pyloric stenosis    ??? Hypertension    ??? Insomnia    ??? Psoriasis     Dermatologist - Dr. Deloria Lair in [facility]   ??? Seizure (CMS-HCC) 02/20/2019   ??? Tobacco use     1ppd       Patient Active Problem List   Diagnosis   ??? Depressive disorder, not elsewhere classified   ??? Alcohol withdrawal syndrome, with delirium (CMS-HCC)   ??? Alcoholism (CMS-HCC)   ??? Anxiety state   ??? Essential hypertension   ??? Insomnia   ??? Psoriasis   ??? Alcohol abuse, episodic   ??? Psoriatic arthritis (CMS-HCC)   ??? Positive colorectal cancer screening using DNA-based stool test   ??? Diarrhea of presumed infectious origin   ???  Alcohol withdrawal seizure (CMS-HCC)   ??? Recurrent falls   ??? Pancytopenia (CMS-HCC)   ??? Thrombocytopenia (CMS-HCC)   ??? Macrocytic anemia   ??? Hyponatremia   ??? Hypomagnesemia       Past Surgical History:   Procedure Laterality Date   ??? PYLOROMYOTOMY     ??? WISDOM TOOTH EXTRACTION           Current Facility-Administered Medications:   ???  acetaminophen (TYLENOL) tablet 650 mg, 650 mg, Oral, Q4H PRN, Coralee Pesa, MD  ???  chlordiazePOXIDE (LIBRIUM) capsule 25 mg, 25 mg, Oral, TID, Coralee Pesa, MD, 25 mg at 10/21/19 1359  ???  divalproex (DEPAKOTE) DR tablet 500 mg, 500 mg, Oral, Q12H SCH, Coralee Pesa, MD  ???  enoxaparin (LOVENOX) syringe 40 mg, 40 mg, Subcutaneous, Q24H, Coralee Pesa, MD, 40 mg at 10/20/19 2111  ???  folic acid (FOLVITE) tablet 1 mg, 1 mg, Oral, Daily, Coralee Pesa, MD, 1 mg at 10/21/19 0946  ???  influenza vaccine quad (FLUARIX, FLULAVAL, FLUZONE) (6 MOS & UP) 2021-22, 0.5 mL, Intramuscular, During hospitalization, Coralee Pesa, MD  ???  lisinopriL (PRINIVIL,ZESTRIL) tablet 20 mg, 20 mg, Oral, Daily, Coralee Pesa, MD, 20 mg at 10/21/19 0946  ???  LORazepam (ATIVAN) injection 1 mg, 1 mg, Intravenous, Q4H PRN, Coralee Pesa, MD  ???  melatonin tablet 3 mg, 3 mg, Oral, Nightly PRN, Coralee Pesa, MD  ???  OLANZapine zydis (ZyPREXA) disintegrating tablet 5 mg, 5 mg, Oral, BID PRN, Coralee Pesa, MD  ???  ondansetron (ZOFRAN-ODT) disintegrating tablet 4 mg, 4 mg, Oral, Q8H PRN, Coralee Pesa, MD  ???  polyethylene glycol (MIRALAX) packet 17 g, 17 g, Oral, Daily PRN, Coralee Pesa, MD  ???  thiamine (B-1) 500 mg in sodium chloride (NS) 0.9 % 100 mL IVPB, 500 mg, Intravenous, Q8H SCH, Coralee Pesa, MD, Stopped at 10/21/19 1429    Allergies  Hydroxyzine    Family History   Problem Relation Age of Onset   ??? Bipolar disorder Mother    ??? Depression Sister    ??? Alcohol abuse Sister    ??? Alcohol abuse Maternal Aunt    ??? Melanoma Neg Hx    ??? Basal cell carcinoma Neg Hx    ??? Squamous cell carcinoma Neg Hx        Social History  Social History     Tobacco Use   ??? Smoking status: Current Every Day Smoker     Packs/day: 1.00     Years: 30.00     Pack years: 30.00     Types: Cigarettes   ??? Smokeless tobacco: Never Used   Substance Use Topics   ??? Alcohol use: Yes     Alcohol/week: 42.0 standard drinks     Types: 42 Cans of beer per week     Comment: 6 pack every day   ??? Drug use: No       Review of Systems    Constitutional: Negative for fever.  Eyes: Negative for visual changes.  ENT: Negative for sore throat.  Cardiovascular: Negative for chest pain.  Respiratory: Negative for shortness of breath.  Gastrointestinal: Negative for abdominal pain, vomiting or diarrhea.  Genitourinary: Negative for dysuria.  Musculoskeletal: + chronic low back pain.  Skin: Negative for rash.  Neurological: Negative for headaches, focal weakness or numbness.  Psychiatric: Normal behavior and mood       Physical Exam  ED Triage Vitals [10/19/19 1832]   Vital Signs Group      Temp 36.8 ??C (98.2 ??F)      Temp src       Heart Rate 88      SpO2 Pulse 88      Heart Rate Source       Resp 14      BP 150/85      MAP (mmHg) 104      BP Location       BP Method       Patient Position    SpO2 97 %   O2 Flow Rate (L/min)    O2 Device None (Room air)         Constitutional: Alert and oriented and in no distress.  Eyes: Conjunctivae are normal.  ENT       Head: Normocephalic and atraumatic.       Nose: No congestion.       Mouth/Throat: Mucous membranes are moist.       Neck: No stridor.  Cardiovascular: Normal rate, regular rhythm. Normal and symmetric distal pulses are present in all extremities.  Respiratory: Normal respiratory effort. Breath sounds are normal.  Gastrointestinal: Soft and nontender.  Musculoskeletal: Normal range of motion in all extremities.       Right lower leg: No tenderness or edema.       Left lower leg: No tenderness or edema.  Neurologic: Cognitive slowing noted with slightly slurred speech. No gross focal neurologic deficits are appreciated. R lower extremity muscle twitching.   Skin: Skin is warm, dry and intact. No rash noted.  Psychiatric: Mood and affect are normal. Speech and behavior are normal.       EKG     EKG: nonspecific ST and T waves changes.    I evaluated this patient with [place], MSIV. I attest that I have reviewed the student note and that the components of the history of the present illness, the physical exam, and the assessment and plan documented were performed by me or were performed in my presence by the student where I verified the documentation and performed (or re-performed) the exam and medical decision making.            Gilman Schmidt, MD  10/21/19 [phone]

## 2019-10-20 NOTE — Unmapped (Signed)
Pt BIB [facility] from home for seizures and alcohol withdrawl. Pts last drink was this morning. A&Ox4. 2.5mg  versed given.

## 2019-10-20 NOTE — Unmapped (Signed)
Hospital Medicine Daily Progress Note    Assessment/Plan:    Principal Problem:    Alcohol withdrawal seizure (CMS-HCC)  Active Problems:    Alcohol withdrawal syndrome, with delirium (CMS-HCC)    Essential hypertension    Thrombocytopenia (CMS-HCC)    Macrocytic anemia    Hyponatremia    Hypomagnesemia  Resolved Problems:    * No resolved hospital problems. *                 Nicholas Rush is a 54 y.o. male that presented to [facility] with Alcohol withdrawal seizure (CMS-HCC).  ??  Seizure 2/2 alcohol withdrawal - severe alcohol use disorder: Patient presented to the ED following a seizure, and patient with 2 additional seizures following (inculding witnessed in the ED). Patient with large daily alcohol consumption, and reported most recent drink 9/30 around 1000. Etiology is likely due to alcohol withdrawal, however currently the patient's CIWA is controlled ranging between 0-4.   - Continue librium 25 mg TID, and will consider tapering dose down to BID tomorrow  - Continue CIWA with PRN IV ativan for elevated CIWA scores  - Continue daily thiamine and folic acid  - Seizure precautions  ??  Hyponatremia: Patient presented with Na 125, and patient with improvement to 130 upon administration of IVF. Likely secondary to alcohol use with low solute intake. However decreased to 129 this AM after IVF stopped overnight. It is also possible that patient's citalopram could be contributing if work up was suggestive of SIADH.    - Restart NS at 100 ml/hr  - Recheck BMP in the AM  - Will to continue to hold home citalopram for now, however if improves can likely restart.  ??  HTN: Normotensive.  - Continue home lisinopril  ??  Macrocytic anemia: Appears chronic in setting of alcohol use - H/H is slightly lower today however likely related to mild dilutional effect.    - Continue to monitor  ??  Thrombocytopenia: Chronic in setting of alcohol use. Platelets decreased slightly to 124.   - Continue to monitor    Hypokalemia/hypomagnesemia: K is 3.3 and Mg 1.5. Etiology is likely due to poor PO intake.   - Replete and continue to monitor    DVT ppx: Lovenox    Code Status: Full Code  ??  ___________________________________________________________________    Subjective:  Patient seen and examined at bedside in the AM. No acute events overnight. CIWA overnight in the ED ranged from 0-4. Patient reports he is feeling good, and reports he does not recall the events from yesterday. Patient denies tremor, diaphoresis, nausea/vomiting, palpitations. Patient reports he drinks probably 6 drinks of bourbon a day, and reports he drinks daily. Patient reports if he does not drink for a day that he will sometimes get the sweats. Patient is not sure if he has ever had a seizure. Patient denies SOB, cough, nausea, vomiting, abdominal pain, diarrhea.     Labs/Studies:  Labs and Studies from the last 24hrs per EMR and Reviewed  Labs significant for: WBC 4.1, H/H 11.4/34.4, platelets 124, Na 129, K 3.3, Cl 95, BUN/Cr 7/0.67, Mg 1.5, albumin 2.9, AST/ALT 56/30, alk phos 139.     Objective:  Temp:  [36.8 ??C (98.2 ??F)-36.9 ??C (98.5 ??F)] 36.9 ??C (98.5 ??F)  Heart Rate:  [69-88] 71  SpO2 Pulse:  [61-88] 61  Resp:  [14-19] 19  BP: (145-160)/(81-107) 150/91  SpO2:  [96 %-98 %] 97 %    GEN: NAD, lying in  bed, speaking in full sentences, oriented x 3  CV: RRR, nl S1/S2, no murmur appreciated  PULM: CTA B, no increased WOB  ABD: soft, NT/ND, +BS  EXT: No pitting edema, 2+ dorsalis pedis pulses, no tremor on extension of hands

## 2019-10-21 LAB — FREE T4: Thyroxine.free:MCnc:Pt:Ser/Plas:Qn:: 1.3

## 2019-10-21 LAB — OSMOLALITY MEASURED: Osmolality:Osmol:Pt:Ser/Plas:Qn:: 259 — ABNORMAL LOW

## 2019-10-21 LAB — COMPREHENSIVE METABOLIC PANEL
ALBUMIN: 3 g/dL — ABNORMAL LOW (ref 3.4–5.0)
ALKALINE PHOSPHATASE: 154 U/L — ABNORMAL HIGH (ref 46–116)
ALT (SGPT): 31 U/L (ref 10–49)
ANION GAP: 6 mmol/L (ref 5–14)
AST (SGOT): 57 U/L — ABNORMAL HIGH (ref <=34)
BILIRUBIN TOTAL: 1 mg/dL (ref 0.3–1.2)
BLOOD UREA NITROGEN: 8 mg/dL — ABNORMAL LOW (ref 9–23)
BUN / CREAT RATIO: 11
CALCIUM: 8.4 mg/dL — ABNORMAL LOW (ref 8.7–10.4)
CHLORIDE: 98 mmol/L (ref 98–107)
CO2: 25 mmol/L (ref 20.0–31.0)
CREATININE: 0.74 mg/dL
EGFR CKD-EPI AA MALE: >90 mL/min/{1.73_m2} (ref >=60)
GLUCOSE RANDOM: 115 mg/dL (ref 70–179)
POTASSIUM: 3.9 mmol/L (ref 3.4–4.5)
SODIUM: 129 mmol/L — ABNORMAL LOW (ref 135–145)

## 2019-10-21 LAB — CBC
HEMATOCRIT: 33.6 % — ABNORMAL LOW (ref 41.0–53.0)
HEMOGLOBIN: 11.2 g/dL — ABNORMAL LOW (ref 13.5–17.5)
MEAN CORPUSCULAR HEMOGLOBIN CONC: 33.3 g/dL (ref 31.0–37.0)
MEAN CORPUSCULAR VOLUME: 112.4 fL — ABNORMAL HIGH (ref 80.0–100.0)
MEAN PLATELET VOLUME: 9.8 fL (ref 7.0–10.0)
PLATELET COUNT: 125 10*9/L — ABNORMAL LOW (ref 150–440)
RED CELL DISTRIBUTION WIDTH: 13.9 % (ref 12.0–15.0)
WBC ADJUSTED: 4.7 10*9/L (ref 4.5–11.0)

## 2019-10-21 LAB — THYROID STIMULATING HORMONE: Thyrotropin:ACnc:Pt:Ser/Plas:Qn:: 0.387 — ABNORMAL LOW

## 2019-10-21 LAB — MAGNESIUM
MAGNESIUM: 1.6 mg/dL (ref 1.6–2.6)
Magnesium:MCnc:Pt:Ser/Plas:Qn:: 1.6

## 2019-10-21 LAB — WBC ADJUSTED: Leukocytes:NCnc:Pt:Bld:Qn:: 4.7

## 2019-10-21 LAB — EGFR CKD-EPI AA MALE: Glomerular filtration rate/1.73 sq M.predicted.black:ArVRat:Pt:Ser/Plas/Bld:Qn:Creatinine-based formula (CKD-EPI): >90

## 2019-10-21 LAB — SODIUM URINE: Lab: 205

## 2019-10-21 LAB — OSMOLALITY URINE: Lab: 514

## 2019-10-21 MED ADMIN — LORazepam (ATIVAN) injection 2 mg: 2 mg | INTRAVENOUS | @ 05:00:00 | Stop: 2019-10-21

## 2019-10-21 MED ADMIN — thiamine (B-1) 500 mg in sodium chloride (NS) 0.9 % 100 mL IVPB: 500 mg | INTRAVENOUS | @ 18:00:00 | Stop: 2019-10-23

## 2019-10-21 MED ADMIN — magnesium sulfate 2gm/50mL IVPB: 2 g | INTRAVENOUS | @ 14:00:00 | Stop: 2019-10-21

## 2019-10-21 MED ADMIN — lisinopriL (PRINIVIL,ZESTRIL) tablet 20 mg: 20 mg | ORAL | @ 14:00:00

## 2019-10-21 MED ADMIN — thiamine (B-1) 500 mg in sodium chloride (NS) 0.9 % 100 mL IVPB: 500 mg | INTRAVENOUS | @ 14:00:00 | Stop: 2019-10-23

## 2019-10-21 MED ADMIN — thiamine (B-1) 500 mg in sodium chloride (NS) 0.9 % 100 mL IVPB: 500 mg | INTRAVENOUS | @ 05:00:00 | Stop: 2019-10-23

## 2019-10-21 MED ADMIN — chlordiazePOXIDE (LIBRIUM) capsule 25 mg: 25 mg | ORAL | @ 14:00:00

## 2019-10-21 MED ADMIN — folic acid (FOLVITE) tablet 1 mg: 1 mg | ORAL | @ 14:00:00

## 2019-10-21 NOTE — Unmapped (Signed)
Pt resting in bed, eyes closed. VSS. No ASD noted. Disoriented to time. Medications administered per order. CIWA done q4. Pt required one dose of Ativan 2mg  per CIWA score. Tolerated well. NS continues at 161ml/hr. Was voiding using condom cath, but pt pulled off. Now using urinal at bedside. Tolerated solid meal. Bed in low, call bell within reach. WCTM.     Problem: Adult Inpatient Plan of Care  Goal: Plan of Care Review  Outcome: Progressing  Goal: Patient-Specific Goal (Individualized)  Outcome: Progressing  Goal: Absence of Hospital-Acquired Illness or Injury  Outcome: Progressing  Intervention: Identify and Manage Fall Risk  Recent Flowsheet Documentation  Taken 10/20/2019 2000 by Nile Dear, RN  Safety Interventions:   aspiration precautions   seizure precautions   commode/urinal/bedpan at bedside   fall reduction program maintained   lighting adjusted for tasks/safety   low bed  Intervention: Prevent Skin Injury  Recent Flowsheet Documentation  Taken 10/20/2019 2000 by Nile Dear, RN  Skin Protection:   adhesive use limited   incontinence pads utilized  Intervention: Prevent and Manage VTE (Venous Thromboembolism) Risk  Recent Flowsheet Documentation  Taken 10/20/2019 2000 by Nile Dear, RN  Activity Management: activity adjusted per tolerance  Goal: Optimal Comfort and Wellbeing  Outcome: Progressing  Goal: Readiness for Transition of Care  Outcome: Progressing  Goal: Rounds/Family Conference  Outcome: Progressing     Problem: Fall Injury Risk  Goal: Absence of Fall and Fall-Related Injury  Outcome: Progressing  Intervention: Promote Injury-Free Environment  Recent Flowsheet Documentation  Taken 10/20/2019 2000 by Nile Dear, RN  Safety Interventions:   aspiration precautions   seizure precautions   commode/urinal/bedpan at bedside   fall reduction program maintained   lighting adjusted for tasks/safety   low bed     Problem: Self-Care Deficit  Goal: Improved Ability to Complete Activities of Daily Living  Outcome: Progressing     Problem: Hypertension Comorbidity  Goal: Blood Pressure in Desired Range  Outcome: Progressing     Problem: Seizure Disorder Comorbidity  Goal: Maintenance of Seizure Control  Outcome: Progressing  Intervention: Maintain Seizure-Symptom Control  Recent Flowsheet Documentation  Taken 10/20/2019 2000 by Nile Dear, RN  Seizure Precautions:   activity supervised   clutter-free environment maintained   side rails padded

## 2019-10-21 NOTE — Unmapped (Signed)
VENOUS ACCESS ULTRASOUND PROCEDURE NOTE    Indications:   Poor venous access.    The Venous Access Team has assessed this patient for the placement of a PIV. Ultrasound guidance was necessary to obtain access.     Procedure Details:  Identity of the patient was confirmed via name, medical record number and date of birth. The availability of the correct equipment was verified.    The vein was identified for ultrasound catheter insertion.  Field was prepared with necessary supplies and equipment.  Probe cover and sterile gel utilized.  Insertion site was prepped with chlorhexidine solution and allowed to dry.  The catheter extension was primed with normal saline.A(n) 20g x 1.75 inch catheter was placed in the right forearm with 1 attempt(s). See LDA for additional details.    Catheter aspirated, 4 mL blood return present. The catheter was then flushed with 10 mL of normal saline. Insertion site cleansed, and dressing applied per manufacturer guidelines. The catheter was inserted without difficulty  by Michel Santee RN.      RN was notified.     Thank you,     Michel Santee RN Venous Access Team   [phone]     Workup / Procedure Time:  30 minutes    See vein image below:

## 2019-10-21 NOTE — Unmapped (Signed)
Patient has been very anxious today and not following commands. Patient states that he understands to call for help and not to get up but yet patient kept trying to get out of bed. Patient also not able to answer questions like he did last night when he arrived on the floor. Patient has also pulled his purwick off twice but states that it fell off. VSS, bed low to the floor and wheels locked. Pt has call bell, room phone and personal phone. Bed alarm is on. After giving patient PRN meds and scheduled meds, patient finally relaxed and is sleeping. Wife came to bedside but did not want to wake him, and gave her all the updates and events that has happened today.

## 2019-10-21 NOTE — Unmapped (Signed)
Patient is A/O x 4, NAD noted, patient is very calm and cooperative, bed is low with locked wheels and padded rails. Patient understands why and knows to call for help if needing to get up. Call bell and remote are at bedside along with bedside table. Condom cath was replaced. Patient has dinner tray and says he will eat it.

## 2019-10-21 NOTE — Unmapped (Addendum)
Hospital Medicine Daily Progress Note    Assessment/Plan:    Principal Problem:    Alcohol withdrawal seizure (CMS-HCC)  Active Problems:    Alcohol withdrawal syndrome, with delirium (CMS-HCC)    Essential hypertension    Thrombocytopenia (CMS-HCC)    Macrocytic anemia    Hyponatremia    Hypomagnesemia  Resolved Problems:    * No resolved hospital problems. *                 Nicholas Rush is a 54 y.o. male that presented to [facility] with Alcohol withdrawal seizure (CMS-HCC).  ??  Seizure 2/2 alcohol withdrawal with DTs - severe alcohol use disorder: Patient presented to the ED following a seizure, and patient with 2 additional seizures following (inculding witnessed in the ED). Patient with large daily alcohol consumption, and reported most recent drink 9/30 around 1000. Etiology is likely due to alcohol withdrawal, and patients symptoms currently are most consistent with DTs. Patient does not appear to be in acute alcohol withdrawal any further, especially evidenced by his stable vital signs and no further seizures. Query if patient with some delirium worsened by ativan.   - Continue librium 25 mg TID for now  - Continue CIWA scores for additional 24 hours - however will discontinue PRN IV ativan  - Start depakote 500 mg BID given delirium, and zyprexa PRN  - Continue daily thiamine and folic acid  - Seizure precautions  - PT/OT consults  ??  Hyponatremia: Patient presented with Na 125, and patient with improvement to 130 upon administration of IVF. Likely secondary to alcohol use with low solute intake. However decreased to back to 129, and did not improve with additional normal saline. Thus, query if some component of SIADH. It is also possible that patient's citalopram could be contributing if work up was suggestive of SIADH.    - Stop additional IVF  - Will work up further with serum osm, urine osm and Na, TSH/fT4  - Recheck BMP in the AM  - Will to continue to hold home citalopram for now, however if improves can likely restart.  ??  HTN: Normotensive.  - Continue home lisinopril  ??  Macrocytic anemia: Appears chronic in setting of alcohol use - H/H is stable.  - Continue to monitor  ??  Thrombocytopenia: Chronic in setting of alcohol use. Platelets stable currently.  - Continue to monitor    Hypokalemia/hypomagnesemia: K improved, however Mg 1.6. Etiology is likely due to poor PO intake.   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 Diff, and upon review of external med rec the patient was prescribed vancomycin PO in 07/2019. She reports the patient was not complaint with the medication, and he was recently referred to outpatient GI. Patient is not actually having diarrhea currently, however she reports the patient was noncompliant with PO vancomycin.   - Will place on enteric precautions to be conservative although no actual diarrhhea  - No indication to c.diff as the patient is not actually having diarrhea - in addition it is reassuring the patient does not have leukocytosis or fevers    DVT ppx: Lovenox    Code Status: Full Code    I spent a prolonged amount of time counseling and updating the patient's domestic partner. Counseled her on likely DTs, and plan to use additional medications besides benzos to help treat his symptoms. Multiple questions answered.  Educated Charity fundraiser on patient as well. Total time spent: 35 minutes. Greater than 50% of time spent counseling and coordinating care.   ??  ___________________________________________________________________    Subjective:  Patient seen and examined at bedside at midday. No acute events overnight. CIWA scores over the last 24 hours have ranged from 1-8, and patient did require a dose of PRN IV ativan 2 mg at 0122.  About 15 minutes prior to my evaluation, patient's RN scored him as CIWA of 9. She reports she scored him due to his restlessness, pulling on external catheters, palpated but not visualized tremor. Reports the patient has been very restless. RN reports the patient was reportedly hallucinating overnight.  Patient reports he is feels ok. Patient denies tremor, diaphoresis, nausea, vomiting, fever, chills, CP, palpitations, SOB.     Collateral information from the patient's domestic partner: Drinks 10 mini-bottles of Fireball a day, and 15 mini-bottles of Perlie Gold a day. Wife is concerned about [facility] as he is not mentally there. Patient does not know address. Patient has had difficulty physically walking at home, and walking with a wide-based gait. Compressions fracture in his back. At home, only drinking [address] and [address].    Labs/Studies:  Labs and Studies from the last 24hrs per EMR and Reviewed  Labs significant for: WBC 4.7, H/H 11.2/33.6, platelets 125, Na 129, K 3.9, Cl 98, BUN/Cr 8/0.74, Mg 1.6, albumin 3.0, AST/ALT 57/31, alk phos 154.     Objective:  Temp:  [36.4 ??C (97.5 ??F)-37.2 ??C (99 ??F)] 36.4 ??C (97.5 ??F)  Heart Rate:  [68-85] 72  SpO2 Pulse:  [79] 79  Resp:  [15-19] 18  BP: (116-162)/(73-97) 149/94  SpO2:  [96 %-100 %] 97 %    GEN: NAD, lying in bed, asleep but easily arousable  CV: RRR, nl S1/S2, no murmur appreciated  PULM: CTA B, no increased WOB  ABD: soft, NT/ND, +BS  EXT: No pitting edema, 2+ dorsalis pedis pulses, no tremor on extension of hands

## 2019-10-22 LAB — COMPREHENSIVE METABOLIC PANEL
ALBUMIN: 2.8 g/dL — ABNORMAL LOW (ref 3.4–5.0)
ALKALINE PHOSPHATASE: 152 U/L — ABNORMAL HIGH (ref 46–116)
ALT (SGPT): 34 U/L (ref 10–49)
ANION GAP: 10 mmol/L (ref 5–14)
AST (SGOT): 49 U/L — ABNORMAL HIGH (ref <=34)
BILIRUBIN TOTAL: 0.6 mg/dL (ref 0.3–1.2)
BLOOD UREA NITROGEN: 7 mg/dL — ABNORMAL LOW (ref 9–23)
BUN / CREAT RATIO: 10
CALCIUM: 8.7 mg/dL (ref 8.7–10.4)
CHLORIDE: 94 mmol/L — ABNORMAL LOW (ref 98–107)
EGFR CKD-EPI AA MALE: >90 mL/min/{1.73_m2} (ref >=60)
EGFR CKD-EPI NON-AA MALE: >90 mL/min/{1.73_m2} (ref >=60)
GLUCOSE RANDOM: 124 mg/dL (ref 70–179)
POTASSIUM: 3.8 mmol/L (ref 3.4–4.5)
SODIUM: 128 mmol/L — ABNORMAL LOW (ref 135–145)

## 2019-10-22 LAB — BUN / CREAT RATIO: Urea nitrogen/Creatinine:MRto:Pt:Ser/Plas:Qn:: 10

## 2019-10-22 LAB — CBC
HEMATOCRIT: 34.6 % — ABNORMAL LOW (ref 41.0–53.0)
HEMOGLOBIN: 11.5 g/dL — ABNORMAL LOW (ref 13.5–17.5)
MEAN CORPUSCULAR HEMOGLOBIN CONC: 33.1 g/dL (ref 31.0–37.0)
MEAN CORPUSCULAR HEMOGLOBIN: 37.3 pg — ABNORMAL HIGH (ref 26.0–34.0)
MEAN CORPUSCULAR VOLUME: 112.6 fL — ABNORMAL HIGH (ref 80.0–100.0)
MEAN PLATELET VOLUME: 10.4 fL — ABNORMAL HIGH (ref 7.0–10.0)
RED BLOOD CELL COUNT: 3.07 10*12/L — ABNORMAL LOW (ref 4.50–5.90)
RED CELL DISTRIBUTION WIDTH: 13.5 % (ref 12.0–15.0)
WBC ADJUSTED: 4.3 10*9/L — ABNORMAL LOW (ref 4.5–11.0)

## 2019-10-22 LAB — CORTISOL TOTAL: Cortisol:MCnc:Pt:Ser/Plas:Qn:: 9.5

## 2019-10-22 LAB — RED CELL DISTRIBUTION WIDTH: Lab: 13.5

## 2019-10-22 LAB — MAGNESIUM: Magnesium:MCnc:Pt:Ser/Plas:Qn:: 1.6

## 2019-10-22 MED ADMIN — lisinopriL (PRINIVIL,ZESTRIL) tablet 20 mg: 20 mg | ORAL | @ 13:00:00

## 2019-10-22 MED ADMIN — magnesium sulfate 2gm/50mL IVPB: 2 g | INTRAVENOUS | @ 13:00:00 | Stop: 2019-10-22

## 2019-10-22 MED ADMIN — OLANZapine zydis (ZyPREXA) disintegrating tablet 5 mg: 5 mg | ORAL | @ 01:00:00

## 2019-10-22 MED ADMIN — chlordiazePOXIDE (LIBRIUM) capsule 25 mg: 25 mg | ORAL | @ 13:00:00 | Stop: 2019-10-22

## 2019-10-22 MED ADMIN — influenza vaccine quad (FLUARIX, FLULAVAL, FLUZONE) (6 MOS & UP) 2021-22: .5 mL | INTRAMUSCULAR | @ 17:00:00 | Stop: 2019-10-22

## 2019-10-22 MED ADMIN — potassium chloride (KLOR-CON) CR tablet 20 mEq: 20 meq | ORAL | @ 13:00:00 | Stop: 2019-10-22

## 2019-10-22 MED ADMIN — chlordiazePOXIDE (LIBRIUM) capsule 25 mg: 25 mg | ORAL | @ 01:00:00

## 2019-10-22 MED ADMIN — thiamine (B-1) 500 mg in sodium chloride (NS) 0.9 % 100 mL IVPB: 500 mg | INTRAVENOUS | @ 02:00:00 | Stop: 2019-10-23

## 2019-10-22 MED ADMIN — folic acid (FOLVITE) tablet 1 mg: 1 mg | ORAL | @ 13:00:00

## 2019-10-22 MED ADMIN — enoxaparin (LOVENOX) syringe 40 mg: 40 mg | SUBCUTANEOUS | @ 01:00:00

## 2019-10-22 MED ADMIN — thiamine (B-1) 500 mg in sodium chloride (NS) 0.9 % 100 mL IVPB: 500 mg | INTRAVENOUS | @ 17:00:00 | Stop: 2019-10-22

## 2019-10-22 NOTE — Unmapped (Signed)
Patient alert and oriented x4 but forgetful and keeps trying to get out of bed. Bed alarm on for patient safety. Medications given as prescribed, taking diet and fluids well. Vitals stable. Enteric precautions continued, history of c diff. Seizure precautions followed and no seizures noted. Continue to monitor. Condom cath/ male purewick in place but patient keeps taking it off, replaced PRN.    Problem: Adult Inpatient Plan of Care  Goal: Plan of Care Review  Outcome: Ongoing - Unchanged  Goal: Patient-Specific Goal (Individualized)  Outcome: Ongoing - Unchanged  Goal: Absence of Hospital-Acquired Illness or Injury  Outcome: Progressing  Intervention: Identify and Manage Fall Risk  Recent Flowsheet Documentation  Taken 10/21/2019 2000 by Erskine Squibb, RN  Safety Interventions:   bed alarm   fall reduction program maintained   lighting adjusted for tasks/safety   low bed   room near unit station  Intervention: Prevent and Manage VTE (Venous Thromboembolism) Risk  Recent Flowsheet Documentation  Taken 10/21/2019 2000 by Erskine Squibb, RN  Activity Management: activity adjusted per tolerance  Goal: Optimal Comfort and Wellbeing  Outcome: Progressing  Goal: Readiness for Transition of Care  Outcome: Ongoing - Unchanged     Problem: Fall Injury Risk  Goal: Absence of Fall and Fall-Related Injury  Outcome: Progressing  Intervention: Promote Injury-Free Environment  Recent Flowsheet Documentation  Taken 10/21/2019 2000 by Erskine Squibb, RN  Safety Interventions:   bed alarm   fall reduction program maintained   lighting adjusted for tasks/safety   low bed   room near unit station     Problem: Self-Care Deficit  Goal: Improved Ability to Complete Activities of Daily Living  Outcome: Ongoing - Unchanged     Problem: Hypertension Comorbidity  Goal: Blood Pressure in Desired Range  Outcome: Progressing     Problem: Seizure Disorder Comorbidity  Goal: Maintenance of Seizure Control  Outcome: Progressing  Intervention: Maintain Seizure-Symptom Control  Recent Flowsheet Documentation  Taken 10/21/2019 2000 by Erskine Squibb, RN  Seizure Precautions:   activity supervised   clutter-free environment maintained   side rails padded     Problem: Skin Injury Risk Increased  Goal: Skin Health and Integrity  Outcome: Progressing  Intervention: Optimize Skin Protection  Recent Flowsheet Documentation  Taken 10/21/2019 2000 by Erskine Squibb, RN  Pressure Reduction Techniques: frequent weight shift encouraged  Head of Bed (HOB) Positioning: HOB elevated  Pressure Reduction Devices: pressure-redistributing mattress utilized     Problem: Infection  Goal: Absence of Infection Signs and Symptoms  Outcome: Progressing

## 2019-10-22 NOTE — Unmapped (Addendum)
PHYSICAL THERAPY  Evaluation (co-eval with Bonnita Nasuti, OT) (10/22/19 0858)     Patient Name:  Nicholas Rush       Medical Record Number: [id number]   Date of Birth: 1966-01-13  Sex: Male            Treatment Diagnosis: general deconditioning, impaired balance, gait instability, altered mental status    Activity Tolerance: Tolerated treatment well    ASSESSMENT  Problem List: Impaired balance, Fall Risk, Decreased endurance, Gait deviation, Decreased strength, Decreased mobility, Decreased safety awareness, Impaired judgement, Impaired ADLs, Poor awareness of body mechanics     Assessment : Nicholas Rush is a 54 y.o. male with hx of complications from alcohol withdrawal here post seizure. Patient presents to PT today with decreased functional mobility secondary to deficits including but not limited to general deconditioning, altered mental status. Pt limited in ability to complete basic mobility tasks this date mostly 2/2 poor balance and decreased safety awareness. Patient would benefit from skilled PT during acute stay to address aforementioned deficits and assist with mobility progression with close monitoring for safety and independence. After review of personal factors/comorbidities, body systems exam, acute hospital problems, current medical presentation and required clinical decision making, evaluation deemed as moderate complexity.     Today's Interventions: Eval, mobility assessment, static/dynamic standing balance, patient ed on: PT role POC, Importance of regular mobility with assistance from staff (up to chair/ambulation), and safety awareness with mobility. All questions answered.                          PLAN  Planned Frequency of Treatment:  1-2x per day for: 3-4x week      Planned Interventions: Balance activities, Diaphragmatic / Pursed-lip breathing, Education - Patient, Education - Family / caregiver, Home exercise program, Gait training, Functional mobility, Functional cognition, Endurance activities, Therapeutic activity, Therapeutic exercise, Transfer training, Stair training    Post-Discharge Physical Therapy Recommendations:  5x weekly, Low intensity    PT DME Recommendations: Defer to post acute (owns cane)           Goals:   Patient and Family Goals: none stated    Long Term Goal #1: Pt will complete transfers and ambulate >800 feet with LRAD at mod I level in 4-6 weeks.       SHORT GOAL #1: Supine<>sit in flat bed, independent.              Time Frame : 2 weeks  SHORT GOAL #2: Transfer sit<>stand with LRAD, mod I.              Time Frame : 2 weeks  SHORT GOAL #3: Ambulate 150 feet with LRAD, mod I.              Time Frame : 2 weeks  SHORT GOAL #4: Navigate up/down 1 flight of stairs with railing, Mod I.              Time Frame : 2 weeks                      Prognosis:  Fair  Positive Indicators: PLOF  Barriers to Discharge: Cognitive deficits, Impaired Balance, Gait instability, Endurance deficits, Decreased safety awareness, Impulsivity, Inability to safely perform ADLS, Poor insight into deficits    SUBJECTIVE  Equipment / Environment: Patient not wearing mask for full session, Vascular access (PIV, TLC, Port-a-cath, PICC)  Patient reports: I like sour  candy.  Current Functional Status: pt/RN Jasmine December agreeable to PT/OT eval; pt received/ended reclined in bed, needs within reach, RN updated.     Prior Functional Status: Pt is a poor historian on eval, unable to give adequate PLOF. Pt reports being Mod I with functional mobility using a SPC and BADLs. He is noted to have a (+) fall history.  Equipment available at home: Gilmer Mor, Agricultural consultant, Shower chair with back     Past Medical History:   Diagnosis Date   ??? Anxiety    ??? Depressive disorder    ??? Erectile dysfunction    ??? Esophageal reflux    ??? History of pyloric stenosis    ??? Hypertension    ??? Insomnia    ??? Psoriasis     Dermatologist - Dr. Deloria Lair in [facility]   ??? Seizure (CMS-HCC) 02/20/2019   ??? Tobacco use     1ppd    Social History Tobacco Use   ??? Smoking status: Current Every Day Smoker     Packs/day: 1.00     Years: 30.00     Pack years: 30.00     Types: Cigarettes   ??? Smokeless tobacco: Never Used   Substance Use Topics   ??? Alcohol use: Yes     Alcohol/week: 42.0 standard drinks     Types: 42 Cans of beer per week     Comment: 6 pack every day      Past Surgical History:   Procedure Laterality Date   ??? PYLOROMYOTOMY     ??? WISDOM TOOTH EXTRACTION      Family History   Problem Relation Age of Onset   ??? Bipolar disorder Mother    ??? Depression Sister    ??? Alcohol abuse Sister    ??? Alcohol abuse Maternal Aunt    ??? Melanoma Neg Hx    ??? Basal cell carcinoma Neg Hx    ??? Squamous cell carcinoma Neg Hx         Allergies: Hydroxyzine                Objective Findings  Precautions / Restrictions  Precautions: Falls precautions, Isolation precautions, Seizure precautions (enteric)  Weight Bearing Status: Non-applicable  Required Braces or Orthoses: Non-applicable    Communication Preference: Verbal   Pain Comments: no c/o pain  Medical Tests / Procedures: EMR reviewed  Equipment / Environment: Vascular access (PIV, TLC, Port-a-cath, PICC), Patient not wearing mask for full session    At Rest: VSS per epic  With Activity: NAD  Orthostatics: asymptomatic  Airway Clearance: mobilization    Living Situation  Living Environment: House (needs clarification)  Lives With: Alone  Home Living: Two level home, Stairs to enter with rails, Bed/bath upstairs, Shower chair with back  Rail placement (outside): Bilateral rails  Number of Stairs to Enter (outside): 4     Cognition  Cognition: Impaired/Limited  Cognition comment: alert, oriented to person, place; disoriented to time and situation; very slow responses to questions.  Visual / Perception status  Visual/Perception: Wears Glasses/Contacts  Skin Inspection: Visible skin appears intact    UE ROM / Strength  UE ROM/Strength: Left Intact, Right Intact  LE ROM / Strength  LE ROM/Strength: Left Impaired/Limited, Right Impaired/Limited  RLE Impairment: Reduced strength  LLE Impairment: Reduced strength  LE comment: mild functional weakness noted bilaterally    Coordination: gross motor intact      Sensation: denies N/T  Balance: sitting: CGA<>Min A sitting EOB; standing static: Min<>Mod A despite  use of cane; Standing dynamic with SPC; Min<>Mod A with LOB x4         Bed Mobility: Supine<>sit with HOB elevated and use of railing, Min A; pt requiring additional cues for sequencing, initiation, and remaining at EOB for symptom monitoring.  Transfers: sit<>stand from bed without device, Min A; pt noted to have decreased balance upon standing despite addition of his SPC.   Gait  Level of Assistance: Minimal assist, patient does 75% or more  Assistive Device: [employer] Ambulated (ft): 10 ft  Gait: Pt amb ~10 feet in room with slow gait speed, decreased/variable step length, LOB x4  Stairs: n/a      Endurance: below reported baseline, fair    Physical Therapy Session Duration  PT Individual [mins]: 15  Reason for Co-treatment: To safely progress mobility, Poor activity tolerance    Medical Staff Made Aware: RN updated    I attest that I have reviewed the above information.  Signed: Dorthula Nettles, PT  Filed 10/22/2019

## 2019-10-22 NOTE — Unmapped (Signed)
OCCUPATIONAL THERAPY  Evaluation (co-evaluation with Arvid Right, PT for safe pt mobility) (10/22/19 0857)    Patient Name:  Nicholas Rush       Medical Record Number: [id number]   Date of Birth: 1965/04/10  Sex: Male          OT Treatment Diagnosis:  balance and cognitive deficits affecting pt's ability to complete ADLs and functional mobility safely    Assessment  Problem List: Decreased strength, Decreased endurance, Decreased mobility, Decreased coordination, Impaired vision, Impaired balance, Decreased safety awareness, Fall Risk, Impaired ADLs  Assessment: Nicholas Rush is a 54 y.o. male with hx of complications from alcohol withdrawal here post seizure. Pt with occupational deficits in ADLs, functional transfers, and functional mobility. He is currently experiencing balance and cognitive deficits affecting his ability to complete self-care safely. Pt requires repeated verbal cues to initiate and complete basic self-care. Pt also expereinced multiple LOB with functional mobility and required assistance to remain upright. Patient is now limited by problem list as mentioned above, impacting ADL/functional mobility performance and safety. Based on consideration of pt's occupational profile, assessment review, level of clinical decision making involved, and intervention plan, this pt is considered to be a moderate complexity case.  Today's Interventions: supine <> sit, sit <> stand, LB dressing, grooming, and functional mobility. OT educating pt re: OOB with assitance, safe transfer techniques, post-acute recs, role of OT, and OT POC.    Activity Tolerance During Today's Session  Tolerated treatment well    Plan  Planned Frequency of Treatment:  1-2x per day for: 2-3x week     Planned Interventions:  ADL retraining, Balance activities, Bed mobility, Conservation, Education - Patient, Home exercise program, Functional mobility, Functional cognition, Compensatory tech. training, Environmental support, Endurance activities, Merchant navy officer, Education - Family / caregiver, Field seismologist education, Teacher, early years/pre, UE Strength / coordination exercise, Therapeutic exercise    Post-Discharge Occupational Therapy Recommendations:  5x weekly, Low intensity   OT DME Recommendations: Defer to post acute    GOALS:   Patient and Family Goals: none stated    Long Term Goal #1: Pt will score 24/24 on AMPAC in 4 weeks.     Short Term:  Pt will complete full body dressing independently   Time Frame : 2 weeks  Pt will complete grooming independently while standing at sink + LRAD   Time Frame : 2 weeks  Pt will complete functional mobility independently + LRAD for safe access of areas of ADLs.   Time Frame : 2 weeks  Pt will follow one-step commands 100% of the time    Prognosis:  Good  Positive Indicators:  PLOF  Barriers to Discharge: Cognitive deficits, Impaired Balance, Gait instability, Endurance deficits, Decreased safety awareness, Impulsivity, Inability to safely perform ADLS, Poor insight into deficits    Subjective  Current Status Pt recieved and left supine in bed with call bell and all needs in reach. RN aware.  Prior Functional Status Pt unable to provide social history. Per Epic and previous OT note, pt previously completing all ADLs and IADLs independently without an AD. Pt drives    Medical Tests / Procedures: Reviewed in Epic.       Patient / Caregiver reports: It's 1967    Past Medical History:   Diagnosis Date   ??? Anxiety    ??? Depressive disorder    ??? Erectile dysfunction    ??? Esophageal reflux    ??? History of pyloric stenosis    ???  Hypertension    ??? Insomnia    ??? Psoriasis     Dermatologist - Dr. Deloria Lair in [facility]   ??? Seizure (CMS-HCC) 02/20/2019   ??? Tobacco use     1ppd    Social History     Tobacco Use   ??? Smoking status: Current Every Day Smoker     Packs/day: 1.00     Years: 30.00     Pack years: 30.00     Types: Cigarettes   ??? Smokeless tobacco: Never Used   Substance Use Topics   ??? Alcohol use: Yes Alcohol/week: 42.0 standard drinks     Types: 42 Cans of beer per week     Comment: 6 pack every day      Past Surgical History:   Procedure Laterality Date   ??? PYLOROMYOTOMY     ??? WISDOM TOOTH EXTRACTION      Family History   Problem Relation Age of Onset   ??? Bipolar disorder Mother    ??? Depression Sister    ??? Alcohol abuse Sister    ??? Alcohol abuse Maternal Aunt    ??? Melanoma Neg Hx    ??? Basal cell carcinoma Neg Hx    ??? Squamous cell carcinoma Neg Hx         Hydroxyzine     Objective Findings  Precautions / Restrictions  Falls precautions, Isolation precautions, Seizure precautions (enteric)    Weight Bearing  Non-applicable    Required Braces or Orthoses  Non-applicable    Pain  no c/o pain    Equipment / Environment  Patient not wearing mask for full session, Vascular access (PIV, TLC, Port-a-cath, PICC)    Living Situation  Living Environment: House (needs clarification)  Lives With: Alone  Home Living: Two level home, Stairs to enter with rails, Bed/bath upstairs, Shower chair with back  Rail placement (outside): Bilateral rails  Number of Stairs to Enter (outside): 4  Equipment available at home: Cane, Agricultural consultant, Air traffic controller chair with back     Cognition   Orientation Level:  Disoriented to time, Disoriented to situation   Arousal/Alertness:  Delayed responses to stimuli   Attention Span:  Difficulty attending to directions, Difficulty dividing attention   Memory:  Decreased recall of biographical information, Decreased recall of recent events, Decreased recall of precautions   Following Commands:  Follows one step commands with increased time, Follows one step commands with repetition   Safety Judgment:  Decreased awareness of need for assistance, Decreased awareness of need for safety   Comments: Pt required verbal and tactile cues to initate self-care and mobility. He was very impulsive during eval and required repeated cues to complete self-care.    Vision / Perception      Vision: Wears glasses all the time      Hand Function   B grip strength WFL    Skin Inspection  all visible skin intact    ROM / Strength/Coordination  UE ROM/ Strength/ Coordination: B UE WFL  LE ROM/ Strength/ Coordination: B LE grossly WFL; difficulty with coordination    Sensation:  No c/o numbness/tingling    Balance:  independent for static and dynamic; min A for static and dynamic standing balance; Pt experiencing multiple LOB with upright mobility    Functional Mobility  Transfer Assistance Needed: Yes (sit <> stand with min A + SPC. Pt experienced LOB posteriorly upon standing)  Bed Mobility Assistance Needed: Yes (supine <> sit min A)  Ambulation: Pt completed functional mobility  at short household distances ~102ft with min A + SPC.    ADLs  ADLs: Needs assistance with ADLs  ADLs - Needs Assistance: Bathing, LB dressing, UB dressing, Toileting, Feeding, Grooming  Feeding - Needs Assistance: Set Up Assist  Grooming - Needs Assistance: Min assist  Bathing - Needs Assistance: Mod assist  Toileting - Needs Assistance: Mod assist  UB Dressing - Needs Assistance: Min assist  LB Dressing - Needs Assistance: Mod assist    Vitals / Orthostatics  At Rest: NAD  With Activity: NAD  Orthostatics: asymptomatic    Medical Staff Made Aware: Jasmine December, RN updated and aware    Occupational Therapy Session Duration  OT Individual [mins]: 17       I attest that I have reviewed the above information.  Signed: Bonnita Nasuti, OT  Filed 10/22/2019

## 2019-10-22 NOTE — Unmapped (Signed)
Hospital Medicine Daily Progress Note    Assessment/Plan:    Principal Problem:    Alcohol withdrawal seizure (CMS-HCC)  Active Problems:    Alcohol withdrawal syndrome, with delirium (CMS-HCC)    Essential hypertension    Thrombocytopenia (CMS-HCC)    Macrocytic anemia    Hyponatremia    Hypomagnesemia  Resolved Problems:    * No resolved hospital problems. *                 Nicholas Rush is a 54 y.o. male that presented to [facility] with Alcohol withdrawal seizure (CMS-HCC).  ??  Seizure 2/2 alcohol withdrawal with DTs - severe alcohol use disorder: Patient presented to the ED following a seizure, and patient with 2 additional seizures following (inculding witnessed in the ED). Patient with large daily alcohol consumption, and reported most recent drink 9/30 around 1000. Etiology is likely due to alcohol withdrawal, and patients symptoms are most consistent with DTs. Patient does not appear to be in acute alcohol withdrawal any further, especially evidenced by his stable vital signs and no further seizures. Suspect patient developed some delirium worsened by ativan.   - Titrate Librium to BID today, and then once daily tomorrow, and then off  - Continue depakote 500 mg BID given delirium for today - however will have it complete with librium  - Continue zyprexa PRN  - Continue daily thiamine and folic acid  - Seizure precautions  - PT/OT consults  ??  Hyponatremia: Patient presented with Na 125, and patient with improvement to 130 upon administration of IVF. Likely secondary to alcohol use with low solute intake. However decreased to back to 129, and did not improve with additional normal saline. Thus, query if some component of SIADH. It is also possible that patient's citalopram could be contributing if work up was suggestive of SIADH.  Upon review of EMR, it also appears the patient with some chronic hyponatremia.  - Start fluid restriction  - Recheck BMP in the AM  - Will to continue to hold home citalopram for now, however if improves can likely restart.  ??  HTN: Normotensive.  - Continue home lisinopril  ??  Macrocytic anemia: Appears chronic in setting of alcohol use - H/H is stable.  - Continue to monitor  ??  Thrombocytopenia: Chronic in setting of alcohol use. Platelets stable currently.  - Continue to monitor    Hypokalemia/hypomagnesemia: K improved, however Mg 1.6. Etiology is likely due to poor PO intake.   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 Diff, and upon review of external med rec the patient was prescribed vancomycin PO in 07/2019. She reports the patient was not complaint with the medication, and he was recently referred to outpatient GI. Patient is not actually having diarrhea currently, however she reports the patient was noncompliant with PO vancomycin. Thus, doubt actual C. Diff infection.   - Continue on enteric precautions to be conservative although no actual diarrhhea  - No indication to c.diff as the patient is not actually having diarrhea - in addition it is reassuring the patient does not have leukocytosis or fevers    DVT ppx: Lovenox    Code Status: Full Code    I updated the patient's domestic partner on 10/3.  ??  ___________________________________________________________________    Subjective:  Patient seen and examined at bedside in the AM. No acute events overnight. CIWA scores  over the last 24 hours have ranged from 4-9. Patient was given a dose of ativan 2 mg around midday, however this was discontinued per treatment plan. Patient did require a dose of zyprexa 5mg  at 2115. Patient reports he is feeling good today. Patient acknowledges he saw a catepillar the other day, but no one else saw it. Patient's partner reports he was talking about catching a chicken last night in the room before she left. Patient has not had any further hallucinations. Denies nausea tremor, diaphoresis, nausea, vomiting, fever, chills, CP, palpitations, SOB. Patient breakfast, and empty tray is at bedside.     Labs/Studies:  Labs and Studies from the last 24hrs per EMR and Reviewed  Labs significant for: WBC 4.3, H/H 11.5/34.6, platelets 105, Na 128, K 3.8, Cl 94, BUN/Cr 7/0.72, Mg 1.6, albumin 2.8, AST/ALT 49/34, alk phos 152.     Objective:  Temp:  [36.4 ??C (97.5 ??F)-36.8 ??C (98.2 ??F)] 36.8 ??C (98.2 ??F)  Heart Rate:  [75-83] 75  Resp:  [18] 18  BP: (122-149)/(85-105) 148/93  SpO2:  [94 %-100 %] 100 %    GEN: NAD, lying in bed, awake/alert, oriented to person/[place] [facility]/October/2021/situation  CV: RRR, nl S1/S2, no murmur appreciated  PULM: CTA B, no increased WOB  ABD: soft, NT/ND, +BS  EXT: No pitting edema, 2+ dorsalis pedis pulses, no tremor on extension of hands

## 2019-10-23 LAB — CBC
HEMATOCRIT: 35.5 % — ABNORMAL LOW (ref 41.0–53.0)
MEAN CORPUSCULAR HEMOGLOBIN CONC: 33.9 g/dL (ref 31.0–37.0)
MEAN CORPUSCULAR HEMOGLOBIN: 37.9 pg — ABNORMAL HIGH (ref 26.0–34.0)
MEAN CORPUSCULAR VOLUME: 111.9 fL — ABNORMAL HIGH (ref 80.0–100.0)
MEAN PLATELET VOLUME: 11 fL — ABNORMAL HIGH (ref 7.0–10.0)
PLATELET COUNT: 116 10*9/L — ABNORMAL LOW (ref 150–440)
RED BLOOD CELL COUNT: 3.17 10*12/L — ABNORMAL LOW (ref 4.50–5.90)
RED CELL DISTRIBUTION WIDTH: 13.2 % (ref 12.0–15.0)

## 2019-10-23 LAB — COMPREHENSIVE METABOLIC PANEL
ALBUMIN: 3 g/dL — ABNORMAL LOW (ref 3.4–5.0)
ALT (SGPT): 35 U/L (ref 10–49)
ANION GAP: 6 mmol/L (ref 5–14)
BILIRUBIN TOTAL: 0.6 mg/dL (ref 0.3–1.2)
BLOOD UREA NITROGEN: 7 mg/dL — ABNORMAL LOW (ref 9–23)
BUN / CREAT RATIO: 11
CALCIUM: 8.8 mg/dL (ref 8.7–10.4)
CHLORIDE: 100 mmol/L (ref 98–107)
CO2: 23 mmol/L (ref 20.0–31.0)
CREATININE: 0.65 mg/dL — ABNORMAL LOW
EGFR CKD-EPI AA MALE: >90 mL/min/{1.73_m2} (ref >=60)
EGFR CKD-EPI NON-AA MALE: >90 mL/min/{1.73_m2} (ref >=60)
GLUCOSE RANDOM: 102 mg/dL (ref 70–179)
POTASSIUM: 3.7 mmol/L (ref 3.4–4.5)
PROTEIN TOTAL: 6.8 g/dL (ref 5.7–8.2)
SODIUM: 129 mmol/L — ABNORMAL LOW (ref 135–145)

## 2019-10-23 LAB — PLATELET COUNT: Platelets:NCnc:Pt:Bld:Qn:Automated count: 116 — ABNORMAL LOW

## 2019-10-23 LAB — MAGNESIUM: Magnesium:MCnc:Pt:Ser/Plas:Qn:: 1.6

## 2019-10-23 LAB — PROTEIN TOTAL: Protein:MCnc:Pt:Ser/Plas:Qn:: 6.8

## 2019-10-23 MED ADMIN — chlordiazePOXIDE (LIBRIUM) capsule 25 mg: 25 mg | ORAL | @ 15:00:00 | Stop: 2019-10-23

## 2019-10-23 MED ADMIN — chlordiazePOXIDE (LIBRIUM) capsule 25 mg: 25 mg | ORAL | @ 01:00:00 | Stop: 2019-10-23

## 2019-10-23 MED ADMIN — folic acid (FOLVITE) tablet 1 mg: 1 mg | ORAL | @ 15:00:00

## 2019-10-23 MED ADMIN — thiamine (B-1) 500 mg in sodium chloride (NS) 0.9 % 100 mL IVPB: 500 mg | INTRAVENOUS | @ 01:00:00 | Stop: 2019-10-22

## 2019-10-23 MED ADMIN — enoxaparin (LOVENOX) syringe 40 mg: 40 mg | SUBCUTANEOUS | @ 01:00:00

## 2019-10-23 MED ADMIN — divalproex (DEPAKOTE) DR tablet 500 mg: 500 mg | ORAL | @ 01:00:00 | Stop: 2019-10-23

## 2019-10-23 NOTE — Unmapped (Signed)
Pt on CIWA, score = 0 today. Pt has been alert for most of the day, he ate 100% of breakfast & lunch (ate late as he fell asleep). Voiding in urinal. No seizure like activity noted. Flu vaccine was given today. Will continue with POC.     Problem: Adult Inpatient Plan of Care  Goal: Plan of Care Review  Outcome: Progressing  Goal: Patient-Specific Goal (Individualized)  Outcome: Progressing  Goal: Absence of Hospital-Acquired Illness or Injury  Outcome: Progressing  Intervention: Identify and Manage Fall Risk  Recent Flowsheet Documentation  Taken 10/22/2019 0800 by Tristan Schroeder, RN  Safety Interventions:   bed alarm   commode/urinal/bedpan at bedside   isolation precautions   lighting adjusted for tasks/safety   nonskid shoes/slippers when out of bed   seizure precautions   supervised activity  Goal: Optimal Comfort and Wellbeing  Outcome: Progressing  Goal: Readiness for Transition of Care  Outcome: Progressing  Goal: Rounds/Family Conference  Outcome: Progressing     Problem: Fall Injury Risk  Goal: Absence of Fall and Fall-Related Injury  Outcome: Progressing  Intervention: Promote Injury-Free Environment  Recent Flowsheet Documentation  Taken 10/22/2019 0800 by Tristan Schroeder, RN  Safety Interventions:   bed alarm   commode/urinal/bedpan at bedside   isolation precautions   lighting adjusted for tasks/safety   nonskid shoes/slippers when out of bed   seizure precautions   supervised activity     Problem: Self-Care Deficit  Goal: Improved Ability to Complete Activities of Daily Living  Outcome: Progressing     Problem: Hypertension Comorbidity  Goal: Blood Pressure in Desired Range  Outcome: Progressing     Problem: Seizure Disorder Comorbidity  Goal: Maintenance of Seizure Control  Outcome: Progressing  Intervention: Maintain Seizure-Symptom Control  Recent Flowsheet Documentation  Taken 10/22/2019 0800 by Tristan Schroeder, RN  Seizure Precautions:   activity supervised   clutter-free environment maintained   side rails padded     Problem: Skin Injury Risk Increased  Goal: Skin Health and Integrity  Outcome: Progressing     Problem: Infection  Goal: Absence of Infection Signs and Symptoms  Outcome: Progressing  Intervention: Prevent or Manage Infection  Recent Flowsheet Documentation  Taken 10/22/2019 0800 by Tristan Schroeder, RN  Isolation Precautions: enteric precautions maintained

## 2019-10-23 NOTE — Unmapped (Signed)
Medicine Daily Progress Note    Assessment/Plan:  Principal Problem:    Alcohol withdrawal seizure (CMS-HCC)  Active Problems:    Alcohol withdrawal syndrome, with delirium (CMS-HCC)    Essential hypertension    Thrombocytopenia (CMS-HCC)    Macrocytic anemia    Hyponatremia    Hypomagnesemia  Resolved Problems:    * No resolved hospital problems. *           Nicholas Rush is a 54 y.o. male who presented to [facility] with Alcohol withdrawal seizure (CMS-HCC).    Seizure in setting of alcohol withdrawal with delirium tremens: Patient presented to the ED following seizure and had 2 additional witnessed seizures in the ED.  Last drink was approximately 12 hours prior to presentation on the morning of 9/30.  He has been monitored with CIWA protocol.  No further seizures while admitted.  - will discontinue CIWA as scores have been 0-2 over the last 24 hours.  - Continue Librium taper, last dose today, 10/4.  - Continue Depakote 500 mg twice daily for delirium; last dose today, 10/4  - Zyprexa as needed agitation  - Seizure precautions  - Continue thiamine and folate  - Social work consult for substance abuse resources    Hyponatremia, chronic:    - holding home citalopram; resume if Na improves    HTN:   - Continue home lisinopril  ??  Macrocytic anemia: Appears chronic in setting of alcohol use.   - Continue to monitor  ??  Thrombocytopenia: Chronic in setting of alcohol use. Platelets stable currently.  - Continue to monitor  ??  Hypokalemia/hypomagnesemia:   - monitor and replace as needed  ??  Reported history of C. Diff: Patient's wife reports a questionable recent diagnosis of C. Diff. Was prescribed vanc in July but never took it. No current diarrhea.   ___________________________________________________________________    Subjective:  Feeling well.  No complaints.     Labs/Studies:  Labs and Studies from the last 24hrs per EMR and Reviewed    Objective:  Temp:  [36.4 ??C (97.5 ??F)-36.8 ??C (98.2 ??F)] 36.4 ??C (97.5 ??F)  Heart Rate:  [78-84] 80  Resp:  [17-18] 18  BP: (140-171)/(88-104) 171/104  SpO2:  [97 %-100 %] 97 %    GEN: Lying in bed, no acute distress  HEENT: sclera anicteric, moist mucous membranes  CV: RRR, no murmur  RESP: CTAB, normal WOB  ABD: soft, non-tender, non-distended,  BS normoactive  EXT: no LE edema  NEURO: non-focal

## 2019-10-23 NOTE — Unmapped (Signed)
Intermittent confusion this shift, speech clear but with delayed responses. Pulled off condom catheter x2 Patient either incontinent at times or missing his urinal. Will not call for help and needs  frequent reminders to stay in bed and call for assistance. Bed alarm for safety. Free from falls and seizures. Scoring low on CIWA. Vitals stable.    Problem: Adult Inpatient Plan of Care  Goal: Plan of Care Review  Outcome: Progressing  Goal: Patient-Specific Goal (Individualized)  Outcome: Progressing  Goal: Absence of Hospital-Acquired Illness or Injury  Outcome: Progressing  Intervention: Identify and Manage Fall Risk  Recent Flowsheet Documentation  Taken 10/22/2019 1951 by Erskine Squibb, RN  Safety Interventions:   bed alarm   fall reduction program maintained   lighting adjusted for tasks/safety   low bed   nonskid shoes/slippers when out of bed  Intervention: Prevent and Manage VTE (Venous Thromboembolism) Risk  Recent Flowsheet Documentation  Taken 10/22/2019 1951 by Erskine Squibb, RN  Activity Management: activity adjusted per tolerance  Goal: Optimal Comfort and Wellbeing  Outcome: Progressing  Goal: Readiness for Transition of Care  Outcome: Ongoing - Unchanged     Problem: Fall Injury Risk  Goal: Absence of Fall and Fall-Related Injury  Outcome: Progressing  Intervention: Promote Injury-Free Environment  Recent Flowsheet Documentation  Taken 10/22/2019 1951 by Erskine Squibb, RN  Safety Interventions:   bed alarm   fall reduction program maintained   lighting adjusted for tasks/safety   low bed   nonskid shoes/slippers when out of bed     Problem: Self-Care Deficit  Goal: Improved Ability to Complete Activities of Daily Living  Outcome: Ongoing - Unchanged     Problem: Hypertension Comorbidity  Goal: Blood Pressure in Desired Range  Outcome: Progressing     Problem: Seizure Disorder Comorbidity  Goal: Maintenance of Seizure Control  Outcome: Progressing  Intervention: Maintain Seizure-Symptom Control  Recent Flowsheet Documentation  Taken 10/22/2019 1951 by Erskine Squibb, RN  Seizure Precautions:   activity supervised   clutter-free environment maintained   side rails padded     Problem: Skin Injury Risk Increased  Goal: Skin Health and Integrity  Outcome: Progressing  Intervention: Optimize Skin Protection  Recent Flowsheet Documentation  Taken 10/22/2019 1951 by Erskine Squibb, RN  Head of Bed [facility]) Positioning: [facility] elevated  Taken 10/22/2019 1945 by Erskine Squibb, RN  Pressure Reduction Techniques: frequent weight shift encouraged  Pressure Reduction Devices: pressure-redistributing mattress utilized     Problem: Infection  Goal: Absence of Infection Signs and Symptoms  Outcome: Progressing  Intervention: Prevent or Manage Infection  Recent Flowsheet Documentation  Taken 10/22/2019 1951 by Erskine Squibb, RN  Isolation Precautions: enteric precautions maintained

## 2019-10-24 MED ADMIN — folic acid (FOLVITE) tablet 1 mg: 1 mg | ORAL | @ 13:00:00

## 2019-10-24 MED ADMIN — melatonin tablet 3 mg: 3 mg | ORAL | @ 01:00:00

## 2019-10-24 MED ADMIN — OLANZapine zydis (ZyPREXA) disintegrating tablet 5 mg: 5 mg | ORAL | @ 11:00:00

## 2019-10-24 MED ADMIN — enoxaparin (LOVENOX) syringe 40 mg: 40 mg | SUBCUTANEOUS | @ 01:00:00

## 2019-10-24 NOTE — Unmapped (Signed)
Seizure in setting of alcohol withdrawal with delirium tremens: Patient presented to the ED following seizure and had 2 additional witnessed seizures in the ED.  Last drink was approximately 12 hours prior to presentation on the morning of 9/30.  He was monitored with CIWA protocol; stopped PRN Ativan due to concern it was causing worsened deliruim.  No further seizures while admitted. He was treated with a Librium taper, last dose 10/4 as well as Depakote 500 mg twice daily for delirium; last dose 10/4. He received thiamine and folate. Social work was consulted for substance abuse resources. He dishcarged to***  ??  Hyponatremia, chronic:  Held home citalopram; resume if Na improves in outpatient setting.   ??  HTN: Continued home lisinopril  ??  Macrocytic anemia: Appears chronic in setting of alcohol use.     Thrombocytopenia: Chronic in setting of alcohol use. Platelets stable.  ??  Hypokalemia/hypomagnesemia: Monitored and replaced as needed  ??  Reported history of C. Diff:??Patient's significant other reported a questionable recent diagnosis of C. Diff. Was prescribed vanc in July but never took it. No diarrhea, leukocytosis, abdominal pain or fever this admission.

## 2019-10-24 NOTE — Unmapped (Signed)
Pt very impulsive; frequently getting out of bed.  Bed alarm in place and chair alarm when he wants to sit in chair.  Alert and oriented x 3 (not to situation) but forgetful.  Has been incontinent of bowel, but seems to be able to urinate in the urinal.  No c/o pain, but constantly trying to reach for things out of bed or trying to walk around the room - he is very unsteady.  Continue to watch closely and keep alarms in place.  VSS.      Problem: Adult Inpatient Plan of Care  Goal: Plan of Care Review  Outcome: Progressing  Goal: Patient-Specific Goal (Individualized)  Outcome: Progressing  Goal: Absence of Hospital-Acquired Illness or Injury  Outcome: Progressing  Intervention: Identify and Manage Fall Risk  Recent Flowsheet Documentation  Taken 10/23/2019 2000 by Patrick Jupiter, RN  Safety Interventions:   bed alarm   chair alarm  Intervention: Prevent and Manage VTE (Venous Thromboembolism) Risk  Recent Flowsheet Documentation  Taken 10/23/2019 2000 by Patrick Jupiter, RN  Activity Management: activity adjusted per tolerance  Goal: Optimal Comfort and Wellbeing  Outcome: Progressing  Goal: Readiness for Transition of Care  Outcome: Progressing  Goal: Rounds/Family Conference  Outcome: Progressing     Problem: Fall Injury Risk  Goal: Absence of Fall and Fall-Related Injury  Outcome: Progressing  Intervention: Promote Injury-Free Environment  Recent Flowsheet Documentation  Taken 10/23/2019 2000 by Patrick Jupiter, RN  Safety Interventions:   bed alarm   chair alarm     Problem: Self-Care Deficit  Goal: Improved Ability to Complete Activities of Daily Living  Outcome: Progressing     Problem: Hypertension Comorbidity  Goal: Blood Pressure in Desired Range  Outcome: Progressing     Problem: Seizure Disorder Comorbidity  Goal: Maintenance of Seizure Control  Outcome: Progressing     Problem: Skin Injury Risk Increased  Goal: Skin Health and Integrity  Outcome: Progressing  Intervention: Optimize Skin Protection  Recent Flowsheet Documentation  Taken 10/23/2019 2126 by Patrick Jupiter, RN  Pressure Reduction Techniques: frequent weight shift encouraged  Pressure Reduction Devices: pressure-redistributing mattress utilized  Taken 10/23/2019 2000 by Patrick Jupiter, RN  Pressure Reduction Techniques: frequent weight shift encouraged  Head of Bed (HOB) Positioning: HOB elevated  Pressure Reduction Devices: pressure-redistributing mattress utilized     Problem: Infection  Goal: Absence of Infection Signs and Symptoms  Outcome: Progressing

## 2019-10-24 NOTE — Unmapped (Signed)
Nicholas Rush resting in bed this shift, AO x 2-3 with VSS. No falls or injuries noted. CIWA scoring low and discontinued around 1200 this shift. Enteric Precautions discontinued at same time. Medications given as ordered.     Wife visiting in PM. She is very concerned about patient's mental status. He has spent 3 hours trying to eat a chicken sandwich. Can't answer any questions unless he has already been given the answer. Explains that he has not had these behaviors in previous detox episodes, and is concerned about seizure being related to new onset of behaviors. Also asking for psych consult for patient. She believes he is depressed and in need of mental health resources.  Paged MD with concerns.      Problem: Adult Inpatient Plan of Care  Goal: Plan of Care Review  Outcome: Progressing  Goal: Patient-Specific Goal (Individualized)  Outcome: Progressing  Goal: Absence of Hospital-Acquired Illness or Injury  Outcome: Progressing  Intervention: Identify and Manage Fall Risk  Recent Flowsheet Documentation  Taken 10/23/2019 0800 by Amedeo Plenty, RN  Safety Interventions:   bed alarm   commode/urinal/bedpan at bedside   low bed   lighting adjusted for tasks/safety   nonskid shoes/slippers when out of bed   room near unit station  Intervention: Prevent Skin Injury  Recent Flowsheet Documentation  Taken 10/23/2019 0800 by Amedeo Plenty, RN  Skin Protection: adhesive use limited  Intervention: Prevent and Manage VTE (Venous Thromboembolism) Risk  Recent Flowsheet Documentation  Taken 10/23/2019 0800 by Amedeo Plenty, RN  Activity Management: activity adjusted per tolerance  Goal: Optimal Comfort and Wellbeing  Outcome: Progressing  Goal: Readiness for Transition of Care  Outcome: Progressing  Goal: Rounds/Family Conference  Outcome: Progressing     Problem: Fall Injury Risk  Goal: Absence of Fall and Fall-Related Injury  Outcome: Progressing  Intervention: Promote Injury-Free Environment  Recent Flowsheet Documentation  Taken 10/23/2019 0800 by Amedeo Plenty, RN  Safety Interventions:   bed alarm   commode/urinal/bedpan at bedside   low bed   lighting adjusted for tasks/safety   nonskid shoes/slippers when out of bed   room near unit station     Problem: Self-Care Deficit  Goal: Improved Ability to Complete Activities of Daily Living  Outcome: Progressing     Problem: Hypertension Comorbidity  Goal: Blood Pressure in Desired Range  Outcome: Progressing     Problem: Seizure Disorder Comorbidity  Goal: Maintenance of Seizure Control  Outcome: Progressing  Intervention: Maintain Seizure-Symptom Control  Recent Flowsheet Documentation  Taken 10/23/2019 0800 by Amedeo Plenty, RN  Seizure Precautions:   activity supervised   side rails padded     Problem: Skin Injury Risk Increased  Goal: Skin Health and Integrity  Outcome: Progressing  Intervention: Optimize Skin Protection  Recent Flowsheet Documentation  Taken 10/23/2019 1000 by Amedeo Plenty, RN  Pressure Reduction Techniques: frequent weight shift encouraged  Taken 10/23/2019 0800 by Amedeo Plenty, RN  Pressure Reduction Techniques: frequent weight shift encouraged  Pressure Reduction Devices: pressure-redistributing mattress utilized  Skin Protection: adhesive use limited     Problem: Infection  Goal: Absence of Infection Signs and Symptoms  Outcome: Progressing  Intervention: Prevent or Manage Infection  Recent Flowsheet Documentation  Taken 10/23/2019 0800 by Amedeo Plenty, RN  Isolation Precautions: enteric precautions maintained

## 2019-10-24 NOTE — Unmapped (Signed)
Medicine Daily Progress Note    Assessment/Plan:  Principal Problem:    Alcohol withdrawal seizure (CMS-HCC)  Active Problems:    Alcohol withdrawal syndrome, with delirium (CMS-HCC)    Essential hypertension    Thrombocytopenia (CMS-HCC)    Macrocytic anemia    Hyponatremia    Hypomagnesemia  Resolved Problems:    * No resolved hospital problems. *           [name] is a 54 y.o. male who presented to [facility] with Alcohol withdrawal seizure (CMS-HCC).    Seizure in setting of alcohol withdrawal with delirium tremens: Patient presented to the ED following seizure and had 2 additional witnessed seizures in the ED.  Last drink was approximately 12 hours prior to presentation on the morning of 9/30.  He has been monitored with CIWA protocol.  No further seizures while admitted. Completed Librium taper and scheduled Depakote for delirium.   - will discontinue CIWA as scores have been 0-2 over the last 24 hours.  - Continue Librium taper, last dose today, 10/4.  - Zyprexa as needed agitation  - Seizure precautions  - Continue thiamine and folate  - Social work consult for substance abuse resources    Hyponatremia, chronic:    - holding home citalopram; resume if Na improves    HTN:   - Continue home lisinopril  ??  Macrocytic anemia: Appears chronic in setting of alcohol use.   - Continue to monitor  ??  Thrombocytopenia: Chronic in setting of alcohol use. Platelets stable currently.  - Continue to monitor  ??  Hypokalemia/hypomagnesemia:   - monitor and replace as needed  ??  Reported history of C. Diff: Patient's wife reports a questionable recent diagnosis of C. Diff. Was prescribed vanc in July but never took it. No current diarrhea.   ___________________________________________________________________    Subjective:  Feeling well.  No complaints. Good attention and problem solving. Oriented to place situation and generally to date/time     Labs/Studies:  Labs and Studies from the last 24hrs per EMR and Reviewed    Objective:  Temp:  [36.3 ??C (97.3 ??F)-36.6 ??C (97.9 ??F)] 36.6 ??C (97.9 ??F)  Heart Rate:  [79-102] 79  Resp:  [18] 18  BP: (109-149)/(72-86) 122/73  SpO2:  [97 %-100 %] 98 %    GEN: Lying in bed, no acute distress  HEENT: sclera anicteric, moist mucous membranes  CV: RRR, no murmur  RESP: CTAB, normal WOB  ABD: soft, non-tender, non-distended,  BS normoactive  EXT: no LE edema  NEURO: non-focal

## 2019-10-24 NOTE — Unmapped (Signed)
Pt has been free from falls throughout the shift. No c/o pain/N/V/D. VSS.   Problem: Adult Inpatient Plan of Care  Goal: Plan of Care Review  Outcome: Progressing  Goal: Patient-Specific Goal (Individualized)  Outcome: Progressing  Goal: Absence of Hospital-Acquired Illness or Injury  Outcome: Progressing  Goal: Optimal Comfort and Wellbeing  Outcome: Progressing  Goal: Readiness for Transition of Care  Outcome: Progressing  Goal: Rounds/Family Conference  Outcome: Progressing     Problem: Fall Injury Risk  Goal: Absence of Fall and Fall-Related Injury  Outcome: Progressing     Problem: Self-Care Deficit  Goal: Improved Ability to Complete Activities of Daily Living  Outcome: Progressing     Problem: Hypertension Comorbidity  Goal: Blood Pressure in Desired Range  Outcome: Progressing     Problem: Seizure Disorder Comorbidity  Goal: Maintenance of Seizure Control  Outcome: Progressing     Problem: Skin Injury Risk Increased  Goal: Skin Health and Integrity  Outcome: Progressing     Problem: Infection  Goal: Absence of Infection Signs and Symptoms  Outcome: Progressing

## 2019-10-24 NOTE — Unmapped (Signed)
Social Work  Psychosocial Assessment  Nicholas Rush is a 54 y.o. male who presented to [facility] with Alcohol withdrawal seizure (CMS-HCC).  This is third hospitalization for pt this year for Alcoholic related issues. Pt has not followed up with any OP treatment program, last time he went to treatment was [facility] in [place] in 2018, father of pt paid the bill.     History completed by gf Nicholas Rush as pt is oriented to self and place right now only. Not sure if pt is still going through withdrawal or perhaps is in a new cognitive baseline, but this will take time to determine.     Per gf, she pays all the bills for pt and works as a Runner, broadcasting/film/video during the day. She admits to buying all the alcohol for him , pours his drinks for him also as she fears he will have a seizure . She go one to describe having to go through detox herself last summer after trying to keep up with him drinking last summer.  She voices she does not feel pt take his meds correctly . Voices he drinks anywhere from 10-25 airplane bottles of liquor daily and now will be incontinent on self at home, can walk with a walker only but mainly lays on couch       GF paints picture of poor support from family unless pt is going to go to rehab , voices he does not want to talk to them. SW will follow up with pt tomorrow to inquire about this.     GF also states she is now going to continue to be have a co dependent relationship with pt, she is planning on moving to [place] alone and not continue to support pt. She voices she has told pt this last evening and will come again this evening and tell him again as last evening he just played with a napkin when she told him and did not appear to understand. He usually calls her constantly during the previous hospitalization but has not this admission.  SW explained medical team usually needs to follow pt over a period of time to determine any sort of cognitive impairment and withdrawal makes it impossible to figure out the true baseline.     Pt was going to an appt to apply for disability but fell twice on way to the lawyer office and came to [facility] for withdrawal. Parents (who pt does not want to talk to per chart) was paying for the lawyer.   Gf describes mixed messages about support from parents, pt has not worked since last January 2020- he wreaked his lexus ( that parents bought for him)    Per gf, she is moving to [place], pt needs to go to PT rehab and SA rehab and then he can come live with her afterwards.    SW will follow up with pt in the am, ask him about dc planning, see what he considers is the dc plan. Spoke with medical team they will follow up with an OT [facility] Skills eval for pt.     Per last admission pt was able to walk 400 feet in about a 1.5 weeks, will see if pt is able to rebound as well . Will talk to pt about SA treatment , however SA will not take anyone that needs PT and OT.      Patient Name: Nicholas Rush   Medical Record Number: [id number]  Date of Birth: November 08, 1965  Sex: Male     Referral  Referred by: Care Manager  Reason for Referral: Substance Abuse Issues    Extended Emergency Contact Information  Primary Emergency Contact: Bisbee,[name]  Address: [address] [place], [place] [address] [place] [employer] Phone: [phone]  Relation: Domestic Partner  Preferred language: ENGLISH  Interpreter needed? No    Legal Next of Kin / Guardian / POA / Advance Directives      Advance Directive (Medical Treatment)  Does patient have an advance directive covering medical treatment?: Patient does not have advance directive covering medical treatment.  Reason patient does not have an advance directive covering medical treatment:: Patient does not wish to complete one at this time.    Health Care Decision Maker [HCDM] (Medical & Mental Health Treatment)  Healthcare Decision Maker: Patient does not wish to appoint a Health Care Decision Maker at this time  Information offered on HCDM, Medical & Mental Health advance directives:: Patient declined information.    Advance Directive (Mental Health Treatment)  Does patient have an advance directive covering mental health treatment?: Patient does not have advance directive covering mental health treatment.  Reason patient does not have an advance directive covering mental health treatment:: Patient needs follow-up to complete one.    Discharge Planning  Discharge Planning Information:   Type of Residence   Mailing Address:  [address]  [place] [address]    Medical Information   Past Medical History:   Diagnosis Date   ??? Anxiety    ??? Depressive disorder    ??? Erectile dysfunction    ??? Esophageal reflux    ??? History of pyloric stenosis    ??? Hypertension    ??? Insomnia    ??? Psoriasis     Dermatologist - Dr. Deloria Lair in [facility]   ??? Seizure (CMS-HCC) 02/20/2019   ??? Tobacco use     1ppd       Past Surgical History:   Procedure Laterality Date   ??? PYLOROMYOTOMY     ??? WISDOM TOOTH EXTRACTION         Family History   Problem Relation Age of Onset   ??? Bipolar disorder Mother    ??? Depression Sister    ??? Alcohol abuse Sister    ??? Alcohol abuse Maternal Aunt    ??? Melanoma Neg Hx    ??? Basal cell carcinoma Neg Hx    ??? Squamous cell carcinoma Neg Hx        Financial Information   Primary Insurance: Payor: MEDICAID LME [employer]ARDINAL [employer]OVATIONS / Plan: MEDICAID LME [employer]AL INNOVATIONS / Product Type: *No Product type* /    Secondary Insurance: None   Prescription Coverage: Medicaid   Preferred Pharmacy: [facility]  [facility] [phone] - , [place] - [address] AT [address] OF [address]  [facility]  [facility] [phone] - [place], [place] - [address] AT [facility] OF [address]    Barriers to taking medication: per gf pt is not compliant with taking his meds     Transition Home   Transportation at time of discharge: Family/Friend's Private Vehicle    Anticipated changes related to Illness: TBD   Services in place prior to admission: none   Services anticipated for DC: TBD  Hemodialysis Prior to Admission: No    Readmission  Risk of Unplanned Readmission Score: UNPLANNED READMISSION SCORE: 16%  Readmitted Within the Last 30 Days?   Readmission Factors include: previous discharge plan unsuccessful    Social Determinants of Health  Social Determinants of Health     Tobacco Use: High Risk   ??? Smoking Tobacco Use: Current Every Day Smoker   ??? Smokeless Tobacco Use: Never Used   Alcohol Use:    ??? How often do you have a drink containing alcohol?:    ??? How many drinks containing alcohol do you have on a typical day when you are drinking?:    ??? How often do you have 5 or more drinks on one occasion?:    Financial Resource Strain: Low Risk    ??? Difficulty of Paying Living Expenses: Not hard at all   Food Insecurity: No Food Insecurity   ??? Worried About Programme researcher, broadcasting/film/video in the Last Year: Never true   ??? Ran Out of Food in the Last Year: Never true   Transportation Needs: No Transportation Needs   ??? Lack of Transportation (Medical): No   ??? Lack of Transportation (Non-Medical): No   Physical Activity:    ??? Days of Exercise per Week:    ??? Minutes of Exercise per Session:    Stress:    ??? Feeling of Stress :    Social Connections:    ??? Frequency of Communication with Friends and Family:    ??? Frequency of Social Gatherings with Friends and Family:    ??? Attends Religious Services:    ??? Database administrator or Organizations:    ??? Attends Engineer, structural:    ??? Marital Status:    Intimate Programme researcher, broadcasting/film/video Violence:    ??? Fear of Current or Ex-Partner:    ??? Emotionally Abused:    ??? Physically Abused:    ??? Sexually Abused:    Depression:    ??? PHQ-2 Score:    Housing/Utilities: Low Risk    ??? Within the past 12 months, have you ever stayed: outside, in a car, in a tent, in an overnight shelter, or temporarily in someone else's home (i.e. couch-surfing)?: No   ??? Are you worried about losing your housing?: No   ??? Within the past 12 months, have you been unable to get utilities (heat, electricity) when it was really needed?: No   Substance Use:    ??? Taken prescription drugs for non-medical reasons:    ??? Taken illegal drugs:    ??? Patient indicated they have taken drugs in the past year for non-medical reasons: Yes, [positive answer(s)]:    Health Literacy:    ??? :        Social History  Support Systems/Concerns: Psychologist, prison and probation services Service: No Conservator, museum/gallery and Psychiatric History  Psychosocial Stressors: Lack of Caregivers / Caregiver burn out      Psychological Issues/Information: Cognitive impairment suspected, Comment           Comment: domestic partner has concerns about dementia for pt due to memory issues.  Chemical Dependency: Alcohol, History of previous inpatient treatment, Other (Comment)              Outpatient Providers: Primary Care  Provider   Name / Contact #: : Delton Prairie MD     [facility] in [place] on [address]  Legal: Comment   Comment: pt has lost his driver licenses due to drinking per domestic partner  Ability to [employer]: Comment   Comment: pt has not been agreeable to go to OP SA, pt does not have insurance to pay for short term rehab for SNF as he does not have LTC medicaid

## 2019-10-25 MED ADMIN — melatonin tablet 3 mg: 3 mg | ORAL | @ 02:00:00

## 2019-10-25 MED ADMIN — lisinopriL (PRINIVIL,ZESTRIL) tablet 20 mg: 20 mg | ORAL | @ 13:00:00

## 2019-10-25 MED ADMIN — cetirizine (ZyrTEC) tablet 10 mg: 10 mg | ORAL | @ 13:00:00

## 2019-10-25 MED ADMIN — enoxaparin (LOVENOX) syringe 40 mg: 40 mg | SUBCUTANEOUS | @ 02:00:00

## 2019-10-25 MED ADMIN — cetirizine (ZyrTEC) tablet 10 mg: 10 mg | ORAL | @ 02:00:00 | Stop: 2019-10-24

## 2019-10-25 MED ADMIN — OLANZapine zydis (ZyPREXA) disintegrating tablet 5 mg: 5 mg | ORAL | @ 04:00:00

## 2019-10-25 MED ADMIN — thiamine (B-1) tablet 100 mg: 100 mg | ORAL | @ 13:00:00

## 2019-10-25 MED ADMIN — folic acid (FOLVITE) tablet 1 mg: 1 mg | ORAL | @ 13:00:00

## 2019-10-25 MED ADMIN — acetaminophen (TYLENOL) tablet 650 mg: 650 mg | ORAL | @ 15:00:00

## 2019-10-25 NOTE — Unmapped (Signed)
A&OX3, reoriented to time. Up most of the night. No c/o pain or discomfort. VSS. Fall precautions maintained, bed in low position, call bell within reach, bed alarm on.

## 2019-10-25 NOTE — Unmapped (Signed)
Medicine Daily Progress Note    Assessment/Plan:  Principal Problem:    Alcohol withdrawal seizure (CMS-HCC)  Active Problems:    Alcohol withdrawal syndrome, with delirium (CMS-HCC)    Essential hypertension    Thrombocytopenia (CMS-HCC)    Macrocytic anemia    Hyponatremia    Hypomagnesemia  Resolved Problems:    * No resolved hospital problems. *           Nicholas Rush is a 54 y.o. male who presented to [facility] with Alcohol withdrawal seizure (CMS-HCC).    Seizure in setting of alcohol withdrawal with delirium tremens: Patient presented to the ED following seizure and had 2 additional witnessed seizures in the ED.  Last drink was approximately 12 hours prior to presentation on the morning of 9/30.  Was monitored with CIWA protocol which was discotinued 10/2.  No further seizures while admitted. Completed Librium taper and scheduled Depakote for delirium.   - Zyprexa as needed agitation  - Seizure precautions  - Continue thiamine and folate  - Social work consult for substance abuse resources    Hyponatremia:  Sodium appears to be chronically low. 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 Cortisol normal, TSH low but FT4 WNL.    - holding home citalopram  - 1000 mL fluid restriction  - monitor    HTN:   - Continue home lisinopril  ??  Macrocytic anemia: Appears chronic in setting of alcohol use.   - Continue to monitor  ??  Thrombocytopenia: Chronic in setting of alcohol use. Platelets stable currently.  - Continue to monitor  ??  Hypokalemia/hypomagnesemia:   - monitor and replace as needed  ??  Reported history of C. Diff: Patient's wife reports a questionable recent diagnosis of C. Diff. Was prescribed vanc in July but never took it. No current diarrhea.     Dispo: PT/OT rec 5x weekly low. Social work working on discharge options as his insurance does not cover SNF.   ___________________________________________________________________    Subjective:  Feeling well.  No complaints. Social work at bedside for the start of this exam. Patient was able to carry an appropriate conversation both with me and the Child psychotherapist. He had good insight into his alcohol use and chronic medical conditions as well as accurate recall of events from several months prior. He remembers that [place] plans to move to [place].  He denied feelings of self harm or hopelessness. He denied diarrhea. He does ask that we not share information with his parents.      Labs/Studies:  Labs and Studies from the last 24hrs per EMR and Reviewed    Objective:  Temp:  [36.5 ??C (97.7 ??F)-37.3 ??C (99.1 ??F)] 36.7 ??C (98.1 ??F)  Heart Rate:  [77-85] 79  Resp:  [17-18] 17  BP: (104-157)/(70-90) 151/84  SpO2:  [98 %-100 %] 100 %    GEN: Lying in bed, no acute distress  HEENT: sclera anicteric, moist mucous membranes  CV: RRR, no murmur  RESP: CTAB, normal WOB  ABD: soft, non-tender, non-distended,  BS normoactive  EXT: no LE edema  NEURO: non-focal

## 2019-10-26 LAB — CREATININE: Creatinine:MCnc:Pt:Ser/Plas:Qn:: 0.63 — ABNORMAL LOW

## 2019-10-26 LAB — BASIC METABOLIC PANEL
ANION GAP: 6 mmol/L (ref 5–14)
BLOOD UREA NITROGEN: 10 mg/dL (ref 9–23)
BUN / CREAT RATIO: 16
CALCIUM: 9.1 mg/dL (ref 8.7–10.4)
CHLORIDE: 98 mmol/L (ref 98–107)
CREATININE: 0.63 mg/dL — ABNORMAL LOW
EGFR CKD-EPI AA MALE: >90 mL/min/{1.73_m2} (ref >=60)
EGFR CKD-EPI NON-AA MALE: >90 mL/min/{1.73_m2} (ref >=60)
GLUCOSE RANDOM: 103 mg/dL (ref 70–179)
POTASSIUM: 4 mmol/L (ref 3.4–4.5)
SODIUM: 130 mmol/L — ABNORMAL LOW (ref 135–145)

## 2019-10-26 LAB — CBC
HEMATOCRIT: 35.6 % — ABNORMAL LOW (ref 41.0–53.0)
HEMOGLOBIN: 12.4 g/dL — ABNORMAL LOW (ref 13.5–17.5)
MEAN CORPUSCULAR HEMOGLOBIN CONC: 34.9 g/dL (ref 31.0–37.0)
MEAN CORPUSCULAR HEMOGLOBIN: 38.5 pg — ABNORMAL HIGH (ref 26.0–34.0)
MEAN PLATELET VOLUME: 9.9 fL (ref 7.0–10.0)
PLATELET COUNT: 197 10*9/L (ref 150–440)
RED BLOOD CELL COUNT: 3.22 10*12/L — ABNORMAL LOW (ref 4.50–5.90)
RED CELL DISTRIBUTION WIDTH: 13.2 % (ref 12.0–15.0)
WBC ADJUSTED: 4.7 10*9/L (ref 4.5–11.0)

## 2019-10-26 LAB — MEAN CORPUSCULAR VOLUME: Erythrocyte mean corpuscular volume:EntVol:Pt:RBC:Qn:Automated count: 110.5 — ABNORMAL HIGH

## 2019-10-26 MED ADMIN — calcium carbonate (TUMS) chewable tablet 200 mg of elem calcium: 200 mg | ORAL | @ 10:00:00

## 2019-10-26 MED ADMIN — lisinopriL (PRINIVIL,ZESTRIL) tablet 20 mg: 20 mg | ORAL | @ 13:00:00

## 2019-10-26 MED ADMIN — melatonin tablet 3 mg: 3 mg | ORAL

## 2019-10-26 MED ADMIN — folic acid (FOLVITE) tablet 1 mg: 1 mg | ORAL | @ 13:00:00

## 2019-10-26 MED ADMIN — cetirizine (ZyrTEC) tablet 10 mg: 10 mg | ORAL | @ 13:00:00

## 2019-10-26 MED ADMIN — enoxaparin (LOVENOX) syringe 40 mg: 40 mg | SUBCUTANEOUS

## 2019-10-26 NOTE — Unmapped (Signed)
Continues with intermittent confusion. Reoriented to place and time frequently this shift. Adequate intake and output. Plan of care reviewed with patient. Bed in low position, call bell within reach, bed alarm activated.

## 2019-10-26 NOTE — Unmapped (Signed)
Medicine Daily Progress Note    Assessment/Plan:  Principal Problem:    Alcohol withdrawal seizure (CMS-HCC)  Active Problems:    Alcohol withdrawal syndrome, with delirium (CMS-HCC)    Essential hypertension    Thrombocytopenia (CMS-HCC)    Macrocytic anemia    Hyponatremia    Hypomagnesemia  Resolved Problems:    * No resolved hospital problems. *           Nicholas Rush is a 54 y.o. male who presented to [facility] with Alcohol withdrawal seizure (CMS-HCC).    Seizure in setting of alcohol withdrawal with delirium tremens: Patient presented to the ED following seizure and had 2 additional witnessed seizures in the ED.  Last drink was approximately 12 hours prior to presentation on the morning of 9/30.  Was monitored with CIWA protocol which was discotinued 10/2.  No further seizures while admitted. Completed Librium taper and scheduled Depakote for delirium.   - Zyprexa as needed agitation  - Seizure precautions  - Continue thiamine and folate  - Social work consult for substance abuse resources    Hyponatremia:  Sodium appears to be chronically low. 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 Cortisol normal, TSH low but FT4 WNL.    - holding home citalopram  - 1000 mL fluid restriction  - Monitor    HTN:   - Continue home lisinopril  ??  Macrocytic anemia: Appears chronic in setting of alcohol use.   - Continue to monitor  ??  Thrombocytopenia: Chronic in setting of alcohol use. Platelets stable currently.  - Continue to monitor  ??  Hypokalemia/hypomagnesemia:   - monitor and replace as needed  ??  Reported history of C. Diff: Patient's wife reports a questionable recent diagnosis of C. Diff. Was prescribed vanc in July but never took it. No current diarrhea.     Dispo: PT/OT rec 5x weekly low. Social work working on discharge options as his insurance does not cover SNF.   ___________________________________________________________________    Subjective:  Feeling well. He talked at length about his back pain and needing injections. I discussed that those are done in the outpatient setting but that his SNF Rehab could arrange them. He is frustrated by bed alarm and we discussed his risk of falls and that is why he needs SNF Rehab.    Labs/Studies:  Labs and Studies from the last 24hrs per EMR and Reviewed    Objective:  Temp:  [36.7 ??C (98.1 ??F)-37 ??C (98.6 ??F)] 36.7 ??C (98.1 ??F)  Heart Rate:  [78-89] 88  Resp:  [16-20] 20  BP: (126-163)/(71-99) 126/89  SpO2:  [100 %] 100 %    GEN: Lying in bed, no acute distress  HEENT: sclera anicteric, moist mucous membranes  CV: RRR, no murmur  RESP: CTAB, normal WOB  ABD: soft, non-tender, non-distended,  BS normoactive  EXT: no LE edema  NEURO: non-focal

## 2019-10-26 NOTE — Unmapped (Signed)
OCCUPATIONAL THERAPY LIVING SKILLS ASSESSMENT    The Performance Assessment of Self-care Skills (PASS) consists of 26 core tasks/items and is criterion-referenced (the client is rated according to established performance criteria). Each item stands alone and is validated and reliable. All items may be administered in total or select items (one or more) relevant for a specific client can be administered; item selection is based on tasks identified as meaningful and relevant to the patient's typical performance.    In addition to the PASS, this evaluator used clinical judgment and reasoning, occupational analysis, and clinical observation to analyze patient performance of ADL, IADL, functional mobility, safety awareness, and executive functioning/cognitive skills. Of note, this assessment was performed in a clinical context to assist with making recommendations for the types and amount of support an individual would need to live independently. The patient's performance may differ significantly in a familiar setting and follow-up intervention and monitoring is recommended to ensure appropriate discharge planning.  -----------------------------------------------------------------------------------------  SUMMARY: Pt required significant verbal cues and occasional gestures to attend to task, initiate/terminate tasks, and to sequence through BADLs/IADLs. Pt required physical assist (min A + cane) to ambulate to/from sink and for functional transfers (sit <> stand from bed and chair). Pt demonstrated safety concern throughout session 2/2 physical and cognitive impairments. He is a high falls risk and demonstrated deficits with executive/higher level cognitive functioning.     DISCHARGE RECOMMENDATIONS: It is this writer's recommendation that this patient discharge to SNF unless able to have 24/7 supervision.   -----------------------------------------------------------------------------------------    SCORING    PASS SCORE Comparative Scoring Level of Assist/Support Discharge Setting    GAF (Global Assmt of Functioning) KELS (Kohlman Evaluation of Living Skills)     3   71-100    4 Independent Independent Living   2 51-70 3 Minimal to Moderate Assist (supervision, verbal cues, gestures)   Supported Living:  Home Health  Group Home  Transitional Housing  Assisted Living (ALF)     1 31-50 2 Moderate Assist (verbal cues, physical assist, some safety issues)   Skilled Nursing (SNF)     0 1-30 1 Maximum to Total Assist (Physical support, total assist, major safety issues)   Total Care       -----------------------------------------------------------------------------------------    SUBTASKS    TASK BADL: Oral Hygiene      PERFORMANCE RATING PATIENT PERFORMANCE SCORE   Independence Occasional verbal cues or demonstration, no physical assistance  Type of Assistance: Verbal directive    2.67/3   Safety Safety risks observed and assistance given to prevent potential harm 1/3   Adequacy Process   ??? Subtasks generally performed w/ lack of precision and/or economy of effort & action; consistent extraneous or redundant actions; steps may be missing    1/3    Quality   ??? Marginal (Performance, for the most part, does not match the quality standards listed in each subtask)         COMMENTS Pt perseverating on telling OT about going to the dentists and having marginal cavities.   Pt also perseverating on his oral hygiene routine and that he performs, in the morning only.   He required increased time to manipulate toothpaste lid 2/2 fine motor and pinch strength impairments   Pt required cues to initiate and terminate task            TASK IADLs: Medication Management      PERFORMANCE RATING PATIENT PERFORMANCE SCORE   Independence Continuous verbal cues or demonstration, no  physical assistance    Type of Assistance: Verbal directive and gestures   2.67/3   Safety Safety risks observed and assistance given to prevent potential harm   1/3   Adequacy Process   ??? Subtasks generally performed w/ lack of precision and/or economy of effort & action; consistent extraneous or redundant actions; steps may be missing    1/3    Quality   ??? Marginal (Performance, for the most part, does not match the quality standards listed in each subtask)         COMMENTS Pt missing steps for time of medication. Pt reports assisting his father with medication management and that sometimes he takes his pills too

## 2019-10-26 NOTE — Unmapped (Signed)
Patient was a&ox2 today - only knew name and that he was at [facility]. Unable to report month, president, and time. Met with ex wife and daughter. Poor safety awareness as he tried to get up without help multiple times throughout shift. Bed alarm on, seizure precautions maintained. Bed locked and in lowest position, call bell within reach. VSS. Will continue to monitor.   Problem: Adult Inpatient Plan of Care  Goal: Plan of Care Review  Outcome: Progressing  Goal: Patient-Specific Goal (Individualized)  Outcome: Progressing  Goal: Absence of Hospital-Acquired Illness or Injury  Outcome: Progressing  Intervention: Identify and Manage Fall Risk  Recent Flowsheet Documentation  Taken 10/25/2019 0800 by Kris Hartmann, RN  Safety Interventions:   lighting adjusted for tasks/safety   low bed   nonskid shoes/slippers when out of bed   fall reduction program maintained   environmental modification   bed alarm  Intervention: Prevent Skin Injury  Recent Flowsheet Documentation  Taken 10/25/2019 0800 by Kris Hartmann, RN  Skin Protection: adhesive use limited  Intervention: Prevent and Manage VTE (Venous Thromboembolism) Risk  Recent Flowsheet Documentation  Taken 10/25/2019 0800 by Kris Hartmann, RN  Activity Management: activity adjusted per tolerance  Goal: Optimal Comfort and Wellbeing  Outcome: Progressing  Goal: Readiness for Transition of Care  Outcome: Progressing  Goal: Rounds/Family Conference  Outcome: Progressing

## 2019-10-27 LAB — BASIC METABOLIC PANEL
ANION GAP: 6 mmol/L (ref 5–14)
BLOOD UREA NITROGEN: 10 mg/dL (ref 9–23)
BUN / CREAT RATIO: 14
CALCIUM: 9.1 mg/dL (ref 8.7–10.4)
CHLORIDE: 95 mmol/L — ABNORMAL LOW (ref 98–107)
CO2: 28 mmol/L (ref 20.0–31.0)
CREATININE: 0.72 mg/dL
EGFR CKD-EPI AA MALE: >90 mL/min/{1.73_m2} (ref >=60)
EGFR CKD-EPI NON-AA MALE: >90 mL/min/{1.73_m2} (ref >=60)
GLUCOSE RANDOM: 87 mg/dL (ref 70–179)
SODIUM: 129 mmol/L — ABNORMAL LOW (ref 135–145)

## 2019-10-27 LAB — BLOOD UREA NITROGEN: Urea nitrogen:MCnc:Pt:Ser/Plas:Qn:: 10

## 2019-10-27 MED ADMIN — lisinopriL (PRINIVIL,ZESTRIL) tablet 20 mg: 20 mg | ORAL | @ 12:00:00

## 2019-10-27 MED ADMIN — thiamine (B-1) tablet 100 mg: 100 mg | ORAL | @ 12:00:00 | Stop: 2019-10-27

## 2019-10-27 MED ADMIN — melatonin tablet 3 mg: 3 mg | ORAL | @ 01:00:00

## 2019-10-27 MED ADMIN — citalopram (CeleXA) tablet 10 mg: 10 mg | ORAL | @ 17:00:00

## 2019-10-27 NOTE — Unmapped (Signed)
Patient has gone in and out of being a&o2-4 throughout the day. Patient's father was updated - pt gave permission and stated the no information to parents was a misunderstanding. Tolerating regular diet, complied with asking before ambulating. Bed locked and in lowest position, call bell within reach. VSS. Will continue to monitor.   Problem: Adult Inpatient Plan of Care  Goal: Plan of Care Review  Outcome: Progressing  Goal: Patient-Specific Goal (Individualized)  Outcome: Progressing  Goal: Absence of Hospital-Acquired Illness or Injury  Outcome: Progressing  Intervention: Identify and Manage Fall Risk  Recent Flowsheet Documentation  Taken 10/26/2019 0800 by Kris Hartmann, RN  Safety Interventions:   low bed   lighting adjusted for tasks/safety   nonskid shoes/slippers when out of bed   fall reduction program maintained   environmental modification   bed alarm  Intervention: Prevent Skin Injury  Recent Flowsheet Documentation  Taken 10/26/2019 0800 by Kris Hartmann, RN  Skin Protection: adhesive use limited  Intervention: Prevent and Manage VTE (Venous Thromboembolism) Risk  Recent Flowsheet Documentation  Taken 10/26/2019 0810 by Kris Hartmann, RN  Activity Management: activity adjusted per tolerance  Goal: Optimal Comfort and Wellbeing  Outcome: Progressing  Goal: Readiness for Transition of Care  Outcome: Progressing  Goal: Rounds/Family Conference  Outcome: Progressing

## 2019-10-27 NOTE — Unmapped (Signed)
Pt alert to self with some confusion to time and situation. Pt tolerates meds and diet well. No BM, no c/o pain. No s/s of agitation. Has some impulsiveness upon movements. Family at patient's bedside for visitation. Pt rested well throughout shift. Will con't to monitor.       Problem: Adult Inpatient Plan of Care  Goal: Plan of Care Review  Outcome: Progressing  Goal: Patient-Specific Goal (Individualized)  Outcome: Progressing  Goal: Absence of Hospital-Acquired Illness or Injury  Outcome: Progressing  Intervention: Prevent and Manage VTE (Venous Thromboembolism) Risk  Recent Flowsheet Documentation  Taken 10/26/2019 2120 by [name], RN  Activity Management: activity adjusted per tolerance  Goal: Optimal Comfort and Wellbeing  Outcome: Progressing  Goal: Readiness for Transition of Care  Outcome: Progressing  Goal: Rounds/Family Conference  Outcome: Progressing     Problem: Fall Injury Risk  Goal: Absence of Fall and Fall-Related Injury  Outcome: Progressing     Problem: Self-Care Deficit  Goal: Improved Ability to Complete Activities of Daily Living  Outcome: Progressing     Problem: Hypertension Comorbidity  Goal: Blood Pressure in Desired Range  Outcome: Progressing     Problem: Seizure Disorder Comorbidity  Goal: Maintenance of Seizure Control  Outcome: Progressing     Problem: Skin Injury Risk Increased  Goal: Skin Health and Integrity  Outcome: Progressing     Problem: Infection  Goal: Absence of Infection Signs and Symptoms  Outcome: Progressing

## 2019-10-27 NOTE — Unmapped (Signed)
Medicine Daily Progress Note    Assessment/Plan:  Principal Problem:    Alcohol withdrawal seizure (CMS-HCC)  Active Problems:    Alcohol withdrawal syndrome, with delirium (CMS-HCC)    Essential hypertension    Thrombocytopenia (CMS-HCC)    Macrocytic anemia    Hyponatremia    Hypomagnesemia  Resolved Problems:    * No resolved hospital problems. *           Nicholas Rush is a 54 y.o. male who presented to [facility] with Alcohol withdrawal seizure (CMS-HCC).    Seizure in setting of alcohol withdrawal with delirium tremens: Patient presented to the ED following seizure and had 2 additional witnessed seizures in the ED.  Last drink was approximately 12 hours prior to presentation on the morning of 9/30.  Was monitored with CIWA protocol which was discotinued 10/2.  No further seizures while admitted. Completed Librium taper and scheduled Depakote for delirium.   - Zyprexa as needed agitation  - Seizure precautions  - Continue thiamine and folate  - Social work consult for substance abuse resources    Intermittent confusion  Seems most consistent with a component of delirium (waxing/waning) due to acute insults (withdrawal, hyponatremia, hospitalization) on top of likely chronic cognitive impairment. Wife concerned this is worsening despite now being out of the window of withdrawal and not really receiving CNS active medications. OT performed living skills assessment which confirmed cognitive impairment.   - CT head to rule out acute intracranial process  - CXR given new cough. COVID negative  - Will give three days thiamine 200mg  TID (10/8 - 10/10)  - Will resume citalopram in case abrupt discontinuation has contributed to symptoms    Hyponatremia, improved:  Sodium appears to be chronically low. 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 Cortisol normal, TSH low but FT4 WNL. Citalopram held on admission.   - Daily BMP for now given resuming citalopram  - 1000 mL fluid restriction  - Monitor    HTN:   - Continue home lisinopril  ??  Macrocytic anemia: Appears chronic in setting of alcohol use.   - Continue to monitor  ??  Thrombocytopenia: Chronic in setting of alcohol use. Platelets stable currently.  - Continue to monitor  ??  Hypokalemia/hypomagnesemia:   - monitor and replace as needed  ??  Reported history of C. Diff: Patient's wife reports a questionable recent diagnosis of C. Diff. Was prescribed vanc in July but never took it. No current diarrhea.     Dispo: PT/OT rec 5x weekly low. Social work working on discharge options as his insurance does not cover SNF.   ___________________________________________________________________    Subjective:  Feeling well, denies new complaints. Did note he was coughing while I was in the room and he said this was new and later asked to be tested for COVID. Denies chronic cough or COPD history but has a long smoking history.    Wife called and voiced concerns about his mental status. Has seen him withdraw before but never take this long to return to baseline. Is calling family and saying things that don't make any sense. She has seen clear waxing and waning with it. Discuss delirium but she is worried there is another medical process we are missing.     Labs/Studies:  Labs and Studies from the last  24hrs per EMR and Reviewed    Objective:  Temp:  [36.4 ??C (97.5 ??F)-36.9 ??C (98.4 ??F)] 36.9 ??C (98.4 ??F)  Heart Rate:  [82-92] 92  Resp:  [17-18] 17  BP: (144-148)/(94-96) 144/94  SpO2:  [95 %-100 %] 95 %    GEN: Lying in bed, no acute distress, interactive but will occasionally be slow to respond or say something that doesn't quite make sense  HEENT: sclera anicteric, moist mucous membranes  CV: RRR, no murmur  RESP: CTAB, normal WOB  ABD: soft, non-tender, non-distended,  BS normoactive  EXT: no LE edema, mild tremor  NEURO: non-focal

## 2019-10-28 LAB — URINALYSIS
BACTERIA: NONE SEEN /HPF
BILIRUBIN UA: NEGATIVE
BLOOD UA: NEGATIVE
GLUCOSE UA: NEGATIVE
KETONES UA: NEGATIVE
LEUKOCYTE ESTERASE UA: NEGATIVE
PH UA: 5 (ref 5.0–9.0)
PROTEIN UA: NEGATIVE
RBC UA: 1 /HPF (ref <=3)
SPECIFIC GRAVITY UA: 1.019 (ref 1.003–1.030)
SQUAMOUS EPITHELIAL: 1 /HPF (ref 0–5)
TRANSITIONAL EPITHELIAL: 1 /HPF (ref 0–2)
UROBILINOGEN UA: 4 — AB
WBC UA: 4 /HPF — ABNORMAL HIGH (ref <=2)

## 2019-10-28 LAB — PROTIME: Coagulation tissue factor induced:Time:Pt:PPP:Qn:Coag: 12.4

## 2019-10-28 LAB — BASIC METABOLIC PANEL
ANION GAP: 6 mmol/L (ref 5–14)
BLOOD UREA NITROGEN: 14 mg/dL (ref 9–23)
BUN / CREAT RATIO: 18
CALCIUM: 9 mg/dL (ref 8.7–10.4)
CHLORIDE: 99 mmol/L (ref 98–107)
CO2: 26 mmol/L (ref 20.0–31.0)
EGFR CKD-EPI AA MALE: >90 mL/min/{1.73_m2} (ref >=60)
EGFR CKD-EPI NON-AA MALE: >90 mL/min/{1.73_m2} (ref >=60)
GLUCOSE RANDOM: 140 mg/dL (ref 70–179)
POTASSIUM: 3.6 mmol/L (ref 3.4–4.5)
SODIUM: 131 mmol/L — ABNORMAL LOW (ref 135–145)

## 2019-10-28 LAB — BLOOD GAS, VENOUS
BASE EXCESS VENOUS: 1.2 (ref -2.0–2.0)
HCO3 VENOUS: 25 mmol/L (ref 22–27)
PO2 VENOUS: 77 mmHg — ABNORMAL HIGH (ref 30–55)

## 2019-10-28 LAB — CBC W/ AUTO DIFF
BASOPHILS ABSOLUTE COUNT: 0 10*9/L (ref 0.0–0.1)
BASOPHILS RELATIVE PERCENT: 0.6 %
EOSINOPHILS ABSOLUTE COUNT: 0.2 10*9/L (ref 0.0–0.4)
EOSINOPHILS RELATIVE PERCENT: 3.7 %
HEMATOCRIT: 34.7 % — ABNORMAL LOW (ref 41.0–53.0)
HEMOGLOBIN: 12.1 g/dL — ABNORMAL LOW (ref 13.5–17.5)
LARGE UNSTAINED CELLS: 2 % (ref 0–4)
LYMPHOCYTES ABSOLUTE COUNT: 1.1 10*9/L — ABNORMAL LOW (ref 1.5–5.0)
LYMPHOCYTES RELATIVE PERCENT: 21.9 %
MEAN CORPUSCULAR HEMOGLOBIN CONC: 34.7 g/dL (ref 31.0–37.0)
MEAN CORPUSCULAR HEMOGLOBIN: 38.3 pg — ABNORMAL HIGH (ref 26.0–34.0)
MEAN CORPUSCULAR VOLUME: 110.4 fL — ABNORMAL HIGH (ref 80.0–100.0)
MEAN PLATELET VOLUME: 9.7 fL (ref 7.0–10.0)
MONOCYTES RELATIVE PERCENT: 11.1 %
NEUTROPHILS ABSOLUTE COUNT: 3.1 10*9/L (ref 2.0–7.5)
PLATELET COUNT: 249 10*9/L (ref 150–440)
RED BLOOD CELL COUNT: 3.14 10*12/L — ABNORMAL LOW (ref 4.50–5.90)
RED CELL DISTRIBUTION WIDTH: 13 % (ref 12.0–15.0)

## 2019-10-28 LAB — HEPATIC FUNCTION PANEL
ALBUMIN: 3.2 g/dL — ABNORMAL LOW (ref 3.4–5.0)
ALT (SGPT): 74 U/L — ABNORMAL HIGH (ref 10–49)
AST (SGOT): 53 U/L — ABNORMAL HIGH (ref <=34)
BILIRUBIN DIRECT: 0.2 mg/dL (ref 0.00–0.30)
PROTEIN TOTAL: 7.1 g/dL (ref 5.7–8.2)

## 2019-10-28 LAB — LYMPHOCYTES ABSOLUTE COUNT: Lymphocytes:NCnc:Pt:Bld:Qn:Automated count: 1.1 — ABNORMAL LOW

## 2019-10-28 LAB — EGFR CKD-EPI NON-AA MALE
Glomerular filtration rate/1.73 sq M.predicted.non black:ArVRat:Pt:Ser/Plas/Bld:Qn:Creatinine-based formula (CKD-EPI): >90

## 2019-10-28 LAB — SPECIMEN SOURCE

## 2019-10-28 LAB — MAGNESIUM: Magnesium:MCnc:Pt:Ser/Plas:Qn:: 1.2 — ABNORMAL LOW

## 2019-10-28 LAB — SMEAR REVIEW: Lab: 0

## 2019-10-28 LAB — BILIRUBIN TOTAL: Bilirubin:MCnc:Pt:Ser/Plas:Qn:: 0.5

## 2019-10-28 LAB — BACTERIA: Bacteria:PrThr:Pt:Urine:Ord:: NONE SEEN

## 2019-10-28 MED ADMIN — citalopram (CeleXA) tablet 10 mg: 10 mg | ORAL | @ 13:00:00

## 2019-10-28 MED ADMIN — ondansetron (ZOFRAN) injection 4 mg: 4 mg | INTRAVENOUS | @ 03:00:00 | Stop: 2019-10-27

## 2019-10-28 MED ADMIN — melatonin tablet 3 mg: 3 mg | ORAL | @ 01:00:00

## 2019-10-28 MED ADMIN — thiamine (B-1) tablet 200 mg: 200 mg | ORAL | @ 18:00:00 | Stop: 2019-10-28

## 2019-10-28 MED ADMIN — OLANZapine zydis (ZyPREXA) disintegrating tablet 5 mg: 5 mg | ORAL | @ 18:00:00

## 2019-10-28 MED ADMIN — LORazepam (ATIVAN) injection 1 mg: 1 mg | INTRAVENOUS | @ 21:00:00 | Stop: 2019-10-28

## 2019-10-28 MED ADMIN — enoxaparin (LOVENOX) syringe 40 mg: 40 mg | SUBCUTANEOUS | @ 01:00:00

## 2019-10-28 MED ADMIN — magnesium sulfate 2gm/50mL IVPB: 2 g | INTRAVENOUS | @ 16:00:00 | Stop: 2019-10-28

## 2019-10-28 MED ADMIN — folic acid (FOLVITE) tablet 1 mg: 1 mg | ORAL | @ 13:00:00

## 2019-10-28 MED ADMIN — thiamine (B-1) tablet 200 mg: 200 mg | ORAL | @ 01:00:00 | Stop: 2019-10-30

## 2019-10-28 NOTE — Unmapped (Signed)
Pt VSS, safety interventions maintained. Continued delirium today, behavior appeared worse than yesterday, short term memory, restlessness. MD notified. Scheduled medication administered, pt went down for abd ultrasound, NPO for procedure, reminded numerous times how to accomplish ADL/assisted by nursing personal. Will continue POC. Reoriented to room,plan of care, call bell. Spoke w providers regarding plan of care/pt status.   Problem: Adult Inpatient Plan of Care  Goal: Plan of Care Review  Outcome: Progressing  Goal: Patient-Specific Goal (Individualized)  Outcome: Progressing  Goal: Absence of Hospital-Acquired Illness or Injury  Outcome: Progressing  Intervention: Identify and Manage Fall Risk  Recent Flowsheet Documentation  Taken 10/28/2019 0813 by Sherron Ales, RN  Safety Interventions:   assistive device   bed alarm   environmental modification   fall reduction program maintained   lighting adjusted for tasks/safety   low bed   nonskid shoes/slippers when out of bed   seizure precautions  Intervention: Prevent Skin Injury  Recent Flowsheet Documentation  Taken 10/28/2019 0813 by Sherron Ales, RN  Skin Protection:   adhesive use limited   protective footwear used   pulse oximeter probe site changed  Goal: Optimal Comfort and Wellbeing  Outcome: Progressing  Goal: Readiness for Transition of Care  Outcome: Progressing  Goal: Rounds/Family Conference  Outcome: Progressing     Problem: Fall Injury Risk  Goal: Absence of Fall and Fall-Related Injury  Outcome: Progressing  Intervention: Promote Injury-Free Environment  Recent Flowsheet Documentation  Taken 10/28/2019 0813 by Sherron Ales, RN  Safety Interventions:   assistive device   bed alarm   environmental modification   fall reduction program maintained   lighting adjusted for tasks/safety   low bed   nonskid shoes/slippers when out of bed   seizure precautions     Problem: Self-Care Deficit  Goal: Improved Ability to Complete Activities of Daily Living  Outcome: Progressing     Problem: Hypertension Comorbidity  Goal: Blood Pressure in Desired Range  Outcome: Progressing     Problem: Seizure Disorder Comorbidity  Goal: Maintenance of Seizure Control  Outcome: Progressing  Intervention: Maintain Seizure-Symptom Control  Recent Flowsheet Documentation  Taken 10/28/2019 0813 by Sherron Ales, RN  Seizure Precautions:   activity supervised   clutter-free environment maintained   emergency equipment at bedside   side rails padded   soft boundaries provided     Problem: Skin Injury Risk Increased  Goal: Skin Health and Integrity  Outcome: Progressing  Intervention: Optimize Skin Protection  Recent Flowsheet Documentation  Taken 10/28/2019 0813 by Sherron Ales, RN  Pressure Reduction Techniques:   frequent weight shift encouraged   heels elevated off bed   positioned off wounds  Pressure Reduction Devices:   heel offloading device utilized   positioning supports utilized  Skin Protection:   adhesive use limited   protective footwear used   pulse oximeter probe site changed     Problem: Infection  Goal: Absence of Infection Signs and Symptoms  Outcome: Progressing

## 2019-10-28 NOTE — Unmapped (Addendum)
Medicine Daily Progress Note    Assessment/Plan:  Principal Problem:    Alcohol withdrawal seizure (CMS-HCC)  Active Problems:    Alcohol withdrawal syndrome, with delirium (CMS-HCC)    Essential hypertension    Thrombocytopenia (CMS-HCC)    Macrocytic anemia    Hyponatremia    Hypomagnesemia  Resolved Problems:    * No resolved hospital problems. *           Nicholas Rush is a 54 y.o. male who presented to [facility] with Alcohol withdrawal seizure (CMS-HCC).    Seizure in setting of alcohol withdrawal with delirium tremens: Patient presented to the ED following seizure and had 2 additional witnessed seizures in the ED.  Last drink was approximately 12 hours prior to presentation on the morning of 9/30.  Was monitored with CIWA protocol which was discotinued 10/2.  No further seizures while admitted. Completed Librium taper and scheduled Depakote for delirium.   - Zyprexa as needed for agitation  - Seizure precautions  - Continue thiamine and folate  - Social work consult for substance abuse resources    Encephalopathy  Etiology remains somewhat unclear. May be multifactorial related to chronic alcohol consumption with component of delirium (waxing/waning) due to acute insults (withdrawal, hyponatremia, hospitalization), likely some baseline chronic cognitive impairment. Mental status changes have been protracted and not yet improving. Wife concerned this is worsening despite now being out of the window of withdrawal and not really receiving CNS active medications, and never having had this, at least not this bad, with prior admissions for withdrawal. OT performed living skills assessment which confirmed cognitive impairment.   - FU CT head to rule out acute intracranial process  - FU radiology interpretation of CXR given new cough but no evidence of acute process by my read. COVID negative.  - Differential includes Wernicke's - Will increase thiamine to 500mg  IV q8H for now (19/9)  - Resumed citalopram 10/8 in case abrupt discontinuation has contributed to symptoms  - Consider also new dx cirrhosis with HE, will check abdominal US and ammonia    Hyponatremia, improved:  Sodium appears to be chronically low. 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 Cortisol normal, TSH low but FT4 WNL. Citalopram held on admission, resumed 10/8, which thus far has not worsening hyponatremia.   - Daily BMP for now given resuming citalopram  - 1000 mL fluid restriction  - Monitor    HTN:   - Continue home lisinopril  ??  Macrocytic anemia: Appears chronic in setting of alcohol use.   - Continue to monitor  ??  Thrombocytopenia: Chronic in setting of alcohol use. Platelets stable currently.  - Continue to monitor  ??  Hypokalemia/hypomagnesemia:   - monitor and replace as needed, repleted IV on 10/9  ??  Reported history of C. Diff: Patient's wife reports a questionable recent diagnosis of C. Diff. Was prescribed vanc in July but never took it. No current diarrhea.     Dispo: PT/OT rec 5x weekly low. Social work working on discharge options as his insurance does not cover SNF.   ___________________________________________________________________    Subjective / Interval events:  Denies any complaints this AM. Remains confused - tried to get out of bed and when asked where he was going told me he needed a walker and he wanted a beer before  he went to jail. RN staff continue to report significant confusion. Yesterday voided on a picture instead of in urinal. Having some difficulty following basic commands.     Has no known hx of cirrhosis, albumin mildly low and AST/ALT mildly elevated. Synthetic function intact.       Labs/Studies:  Labs and Studies from the last 24hrs per EMR and Reviewed    Objective:  Temp:  [36.7 ??C (98.1 ??F)-37 ??C (98.6 ??F)] 37 ??C (98.6 ??F)  Heart Rate: [79-94] 81  Resp:  [17-18] 18  BP: (129-152)/(71-87) 139/79  SpO2:  [99 %-100 %] 99 %    GEN: Lying in bed, no acute distress, interactive but often slow to respond or says something that doesn't answer your question or make sense in current context  HEENT: sclera anicteric, moist mucous membranes, +anisocoria with left pupil ~56mm and right ~28mm - patient reports this is chronic and has been documented previously  CV: RRR, no murmur  RESP: CTAB, normal WOB  ABD: soft, non-tender, non-distended,  BS normoactive  EXT: no LE edema, mild tremor, do not appreciate any asterixis  NEURO: non-focal, strength and sensation intact throughout. CN intact aside from anisocoria.

## 2019-10-28 NOTE — Unmapped (Signed)
VSS, pt safety maintained. Triggered bed alarm a few times, moments requiring reorientation to situation. Pleasant affect. No complaints of nausea, discomfort. Will continue POC.  Problem: Adult Inpatient Plan of Care  Goal: Plan of Care Review  Outcome: Progressing  Goal: Patient-Specific Goal (Individualized)  Outcome: Progressing  Goal: Absence of Hospital-Acquired Illness or Injury  Outcome: Progressing  Intervention: Identify and Manage Fall Risk  Recent Flowsheet Documentation  Taken 10/27/2019 0842 by Sherron Ales, RN  Safety Interventions:   environmental modification   fall reduction program maintained   family at bedside  Intervention: Prevent Skin Injury  Recent Flowsheet Documentation  Taken 10/27/2019 0842 by Sherron Ales, RN  Skin Protection:   adhesive use limited   transparent dressing maintained   tubing/devices free from skin contact  Goal: Optimal Comfort and Wellbeing  Outcome: Progressing  Goal: Readiness for Transition of Care  Outcome: Progressing  Goal: Rounds/Family Conference  Outcome: Progressing     Problem: Fall Injury Risk  Goal: Absence of Fall and Fall-Related Injury  Outcome: Progressing  Intervention: Promote Injury-Free Environment  Recent Flowsheet Documentation  Taken 10/27/2019 0842 by Sherron Ales, RN  Safety Interventions:   environmental modification   fall reduction program maintained   family at bedside     Problem: Self-Care Deficit  Goal: Improved Ability to Complete Activities of Daily Living  Outcome: Progressing     Problem: Hypertension Comorbidity  Goal: Blood Pressure in Desired Range  Outcome: Progressing     Problem: Seizure Disorder Comorbidity  Goal: Maintenance of Seizure Control  Outcome: Progressing  Intervention: Maintain Seizure-Symptom Control  Recent Flowsheet Documentation  Taken 10/27/2019 0842 by Sherron Ales, RN  Seizure Precautions: activity supervised     Problem: Skin Injury Risk Increased  Goal: Skin Health and Integrity  Outcome: Progressing  Intervention: Optimize Skin Protection  Recent Flowsheet Documentation  Taken 10/27/2019 0842 by Sherron Ales, RN  Pressure Reduction Techniques:   frequent weight shift encouraged   heels elevated off bed   positioned off wounds  Pressure Reduction Devices:   heel offloading device utilized   positioning supports utilized  Skin Protection:   adhesive use limited   transparent dressing maintained   tubing/devices free from skin contact     Problem: Infection  Goal: Absence of Infection Signs and Symptoms  Outcome: Progressing

## 2019-10-28 NOTE — Unmapped (Signed)
Additional order received and the patient is already on the caseload. Continue with  plan of care.    OT previously completed living skills assessment (PASS) 10/6, discussed results with MD/CM.

## 2019-10-29 LAB — TOXICOLOGY SCREEN, URINE
AMPHETAMINE SCREEN URINE: NEGATIVE
BARBITURATE SCREEN URINE: NEGATIVE
BENZODIAZEPINE SCREEN, URINE: POSITIVE — AB
BUPRENORPHINE, URINE SCREEN: NEGATIVE
CANNABINOID SCREEN URINE: NEGATIVE
FENTANYL SCREEN, URINE: NEGATIVE
METHADONE SCREEN, URINE: NEGATIVE
OXYCODONE SCREEN URINE: NEGATIVE

## 2019-10-29 LAB — AMMONIA: Ammonia:SCnc:Pt:Plas:Qn:: <10 — ABNORMAL LOW

## 2019-10-29 LAB — BASIC METABOLIC PANEL
ANION GAP: 5 mmol/L (ref 5–14)
ANION GAP: 5 mmol/L (ref 5–14)
BLOOD UREA NITROGEN: 13 mg/dL (ref 9–23)
BLOOD UREA NITROGEN: 13 mg/dL (ref 9–23)
BUN / CREAT RATIO: 16
BUN / CREAT RATIO: 16
CALCIUM: 9.4 mg/dL (ref 8.7–10.4)
CHLORIDE: 95 mmol/L — ABNORMAL LOW (ref 98–107)
CHLORIDE: 97 mmol/L — ABNORMAL LOW (ref 98–107)
CO2: 28 mmol/L (ref 20.0–31.0)
CO2: 29 mmol/L (ref 20.0–31.0)
CREATININE: 0.81 mg/dL
CREATININE: 0.83 mg/dL
EGFR CKD-EPI AA MALE: >90 mL/min/{1.73_m2} (ref >=60)
EGFR CKD-EPI AA MALE: >90 mL/min/{1.73_m2} (ref >=60)
EGFR CKD-EPI NON-AA MALE: >90 mL/min/{1.73_m2} (ref >=60)
GLUCOSE RANDOM: 86 mg/dL (ref 70–179)
GLUCOSE RANDOM: 88 mg/dL (ref 70–179)
POTASSIUM: 3.9 mmol/L (ref 3.4–4.5)
POTASSIUM: 3.9 mmol/L (ref 3.4–4.5)
SODIUM: 130 mmol/L — ABNORMAL LOW (ref 135–145)

## 2019-10-29 LAB — ETHANOL: Ethanol:MCnc:Pt:Ser/Plas:Qn:GC: <10

## 2019-10-29 LAB — POTASSIUM: Potassium:SCnc:Pt:Ser/Plas:Qn:: 3.9

## 2019-10-29 LAB — FENTANYL SCREEN, URINE: Lab: NEGATIVE

## 2019-10-29 LAB — MAGNESIUM: Magnesium:MCnc:Pt:Ser/Plas:Qn:: 1.7

## 2019-10-29 LAB — GLUCOSE RANDOM: Glucose:MCnc:Pt:Ser/Plas:Qn:: 88

## 2019-10-29 MED ADMIN — folic acid (FOLVITE) tablet 1 mg: 1 mg | ORAL | @ 13:00:00

## 2019-10-29 MED ADMIN — OLANZapine zydis (ZyPREXA) disintegrating tablet 5 mg: 5 mg | ORAL | @ 01:00:00

## 2019-10-29 MED ADMIN — LORazepam (ATIVAN) injection 1 mg: 1 mg | INTRAVENOUS | @ 13:00:00 | Stop: 2019-10-29

## 2019-10-29 MED ADMIN — haloperidol lactate (HALDOL) injection 2 mg: 2 mg | INTRAMUSCULAR | Stop: 2019-10-29

## 2019-10-29 MED ADMIN — thiamine (B-1) 500 mg in sodium chloride (NS) 0.9 % 100 mL IVPB: 500 mg | INTRAVENOUS | @ 09:00:00 | Stop: 2019-10-31

## 2019-10-29 MED ADMIN — LORazepam (ATIVAN) injection 1 mg: 1 mg | INTRAVENOUS | @ 15:00:00 | Stop: 2019-10-29

## 2019-10-29 MED ADMIN — cetirizine (ZyrTEC) tablet 10 mg: 10 mg | ORAL | @ 13:00:00

## 2019-10-29 MED ADMIN — melatonin tablet 3 mg: 3 mg | ORAL | @ 01:00:00

## 2019-10-29 MED ADMIN — lisinopriL (PRINIVIL,ZESTRIL) tablet 20 mg: 20 mg | ORAL | @ 13:00:00

## 2019-10-29 NOTE — Unmapped (Signed)
No injuries during shift. Bed alarm remains on d/t impulsivity and confusion. IV ativan given for agitation per MD order. Order for urine tox screen still pending. Pt's significant other at bedside. Call bell within reach.

## 2019-10-29 NOTE — Unmapped (Signed)
Medicine Daily Progress Note    Assessment/Plan:  Principal Problem:    Alcohol withdrawal seizure (CMS-HCC)  Active Problems:    Alcohol withdrawal syndrome, with delirium (CMS-HCC)    Essential hypertension    Thrombocytopenia (CMS-HCC)    Macrocytic anemia    Hyponatremia    Hypomagnesemia  Resolved Problems:    * No resolved hospital problems. *           Nicholas Rush is a 54 y.o. male who presented to [facility] with Alcohol withdrawal seizure (CMS-HCC).    Seizure in setting of alcohol withdrawal with delirium tremens: Patient presented to the ED following seizure and had 2 additional witnessed seizures in the ED.  Last drink was approximately 12 hours prior to presentation on the morning of 9/30.  Was monitored with CIWA protocol which was discotinued 10/2.  No further seizures while admitted. Completed Librium taper.   - Zyprexa as needed for agitation  - Seizure precautions  - Continue thiamine and folate  - Social work consult for substance abuse resources    Delirium  Considered multiple potential causes of persistent encephalopathy but evaluation has been unrevealing including head CT with no acute process, no evidence of cirrhosis on labs or imaging, no evidence of acute infection including COVID or on CXR or UA. Suspect at this point continued waxing and waning confusion is primarily delrium. May also have some degree of Wernicke's and/or baseline cognitive impairment at this point. OT performed living skills assessment which confirmed cognitive impairment. Trialed low dose benzo on 10/9 and 10/10 to see if due to some protracted EtOH withdrawal but did not seem to help.   - Continue thiamine to 500mg  IV q8H for now (10/9 - )  - Resumed citalopram 10/8  - Delirium precaations. Unfortunately is challenging due on 1 memorial.     Hyponatremia, stable:  Sodium appears to be chronically low. 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 Cortisol normal, TSH low but FT4 WNL. Citalopram held on admission, resumed 10/8.   - Continue to monitor sodium for now to ensure resuming citalopram is not worsening hyponatremia  - Continue 1000 mL fluid restriction for now    HTN:   - Continue home lisinopril  ??  Macrocytic anemia: Appears chronic in setting of alcohol use.   - Continue to monitor  ??  Thrombocytopenia: Chronic in setting of alcohol use. Platelets stable currently.  - Continue to monitor  ??  Hypokalemia/hypomagnesemia:   - monitor and replace as needed, repleted IV on 10/9  ??  Reported history of C. Diff: Patient's wife reports a questionable recent diagnosis of C. Diff. Was prescribed vanc in July but never took it. No current diarrhea.     Dispo: PT/OT rec 5x weekly low. Social work working on discharge options as his insurance does not cover SNF.   ___________________________________________________________________    Subjective / Interval events:  Denies any complaints this AM. Continues to have waxing and waning confusion and agitation. Per charge RN [name] does seem to be helpful. Was unclear if 1mg  IV ativan yesterday helped or not, so tried again today. Initally seemed better after 1mg  IV ativan. Gave another 1mg  IV ativan and seemed more confused and was found out of bed having urinated on himself, thus feel ativan probably is not helping  his confusion. His significant other was at bedside this AM and updated.       Labs/Studies:  Labs and Studies from the last 24hrs per EMR and Reviewed    Objective:  Temp:  [36.7 ??C (98.1 ??F)-37.1 ??C (98.8 ??F)] 36.7 ??C (98.1 ??F)  Heart Rate:  [78-92] 92  Resp:  [16-18] 16  BP: (113-155)/(71-88) 142/88  SpO2:  [97 %-100 %] 100 %    GEN: Lying in bed, no acute distress, generally answers questions and this AM was able to tell me he was admitted for a seizure but remains intermittently confused  HEENT: sclera anicteric, moist mucous membranes, +anisocoria with left pupil ~70mm and right ~55mm - patient reports this is chronic and has been documented previously  CV: RRR, no murmur  RESP: CTAB, normal WOB  ABD: soft, non-tender, non-distended,  BS normoactive  EXT: no LE edema, mild tremor, do not appreciate any asterixis  NEURO: non-focal, strength and sensation intact throughout. CN intact aside from anisocoria.

## 2019-10-29 NOTE — Unmapped (Signed)
Problem: Adult Inpatient Plan of Care  Goal: Plan of Care Review  Outcome: Ongoing - Unchanged  Goal: Patient-Specific Goal (Individualized)  Outcome: Ongoing - Unchanged  Goal: Absence of Hospital-Acquired Illness or Injury  Outcome: Ongoing - Unchanged  Intervention: Identify and Manage Fall Risk  Recent Flowsheet Documentation  Taken 10/28/2019 2000 by Misty Stanley, RN  Safety Interventions:   assistive device   bed alarm   fall reduction program maintained   low bed   nonskid shoes/slippers when out of bed  Intervention: Prevent and Manage VTE (Venous Thromboembolism) Risk  Recent Flowsheet Documentation  Taken 10/28/2019 2000 by Misty Stanley, RN  Activity Management: activity adjusted per tolerance  Goal: Optimal Comfort and Wellbeing  Outcome: Ongoing - Unchanged  Goal: Readiness for Transition of Care  Outcome: Ongoing - Unchanged  Goal: Rounds/Family Conference  Outcome: Ongoing - Unchanged     Problem: Fall Injury Risk  Goal: Absence of Fall and Fall-Related Injury  Outcome: Ongoing - Unchanged  Intervention: Promote Injury-Free Environment  Recent Flowsheet Documentation  Taken 10/28/2019 2000 by Misty Stanley, RN  Safety Interventions:   assistive device   bed alarm   fall reduction program maintained   low bed   nonskid shoes/slippers when out of bed     Problem: Seizure Disorder Comorbidity  Goal: Maintenance of Seizure Control  Outcome: Ongoing - Unchanged     Problem: Skin Injury Risk Increased  Goal: Skin Health and Integrity  Outcome: Ongoing - Unchanged  Intervention: Optimize Skin Protection  Recent Flowsheet Documentation  Taken 10/28/2019 2000 by Misty Stanley, RN  Head of Bed [facility]) Positioning: HOB at 30 degrees    No falls this shift. Bed alarm on.  No Sz's noted. Bed rails padded. Pt turns self in bed.  Call bell in reach.

## 2019-10-30 LAB — BASIC METABOLIC PANEL
ANION GAP: 5 mmol/L (ref 5–14)
BLOOD UREA NITROGEN: 15 mg/dL (ref 9–23)
BUN / CREAT RATIO: 17
CALCIUM: 9.3 mg/dL (ref 8.7–10.4)
CHLORIDE: 100 mmol/L (ref 98–107)
CREATININE: 0.88 mg/dL
EGFR CKD-EPI AA MALE: >90 mL/min/{1.73_m2} (ref >=60)
EGFR CKD-EPI NON-AA MALE: >90 mL/min/{1.73_m2} (ref >=60)
GLUCOSE RANDOM: 84 mg/dL (ref 70–179)
SODIUM: 131 mmol/L — ABNORMAL LOW (ref 135–145)

## 2019-10-30 LAB — SYPHILIS RPR SCREEN: Reagin Ab:PrThr:Pt:Ser:Ord:RPR: NONREACTIVE

## 2019-10-30 LAB — CO2: Carbon dioxide:SCnc:Pt:Ser/Plas:Qn:: 26

## 2019-10-30 MED ADMIN — enoxaparin (LOVENOX) syringe 40 mg: 40 mg | SUBCUTANEOUS | @ 02:00:00

## 2019-10-30 MED ADMIN — thiamine (B-1) 500 mg in sodium chloride (NS) 0.9 % 100 mL IVPB: 500 mg | INTRAVENOUS | @ 02:00:00 | Stop: 2019-10-31

## 2019-10-30 MED ADMIN — melatonin tablet 3 mg: 3 mg | ORAL | @ 02:00:00

## 2019-10-30 MED ADMIN — OLANZapine zydis (ZyPREXA) disintegrating tablet 5 mg: 5 mg | ORAL | @ 20:00:00

## 2019-10-30 MED ADMIN — cetirizine (ZyrTEC) tablet 10 mg: 10 mg | ORAL | @ 13:00:00

## 2019-10-30 MED ADMIN — folic acid (FOLVITE) tablet 1 mg: 1 mg | ORAL | @ 13:00:00

## 2019-10-30 MED ADMIN — thiamine (B-1) 500 mg in sodium chloride (NS) 0.9 % 100 mL IVPB: 500 mg | INTRAVENOUS | @ 09:00:00 | Stop: 2019-10-30

## 2019-10-30 MED ADMIN — citalopram (CeleXA) tablet 10 mg: 10 mg | ORAL | @ 13:00:00

## 2019-10-30 MED ADMIN — lisinopriL (PRINIVIL,ZESTRIL) tablet 20 mg: 20 mg | ORAL | @ 13:00:00

## 2019-10-30 NOTE — Unmapped (Addendum)
Pt resting in bed, eyes closed. Disoriented to situation. Not sure of where exactly he was, but able to verbalize he was in a hospital. VSS. No ASD noted. Shortly after start of shift, pt became agitated and got up from bed stating he needed to be discharged. Unable to get pt back into bed despite redirection and call to girlfriend. Pt appeared very unsteady on his feet. Behavioral response called, and by time team arrived pt was sitting back in bed but remained agitated. Hospitalist on call provided order for IM Haldol d/t not having Zyprexa in stock on unit and pt having lost IV access. Administered and pt calm on reassessment. No further agitation noted currently. Medications administered per order. Urine tox collected. Safety and sz precautions maintained. Bed alarm activated and audible. Room across from nurses' station. Bed in low, call bell within reach. WCTM.      Addendum: Pt got up from bed, wanting to get dressed and leave. Allowed pt to change into clothes he likes, but advised that it is 0400 in the AM and he needs to sleep a little longer. Administered prn Zyprexa and pt agreed to get back into bed. Bed alarm activated and audible.     Problem: Adult Inpatient Plan of Care  Goal: Plan of Care Review  Outcome: Progressing  Goal: Patient-Specific Goal (Individualized)  Outcome: Progressing  Goal: Absence of Hospital-Acquired Illness or Injury  Outcome: Progressing  Intervention: Identify and Manage Fall Risk  Recent Flowsheet Documentation  Taken 10/29/2019 2000 by Nile Dear, RN  Safety Interventions:  ??? commode/urinal/bedpan at bedside  ??? fall reduction program maintained  ??? lighting adjusted for tasks/safety  ??? low bed  ??? nonskid shoes/slippers when out of bed  ??? seizure precautions  Intervention: Prevent Skin Injury  Recent Flowsheet Documentation  Taken 10/29/2019 2000 by Nile Dear, RN  Skin Protection:  ??? adhesive use limited  ??? incontinence pads utilized  Intervention: Prevent and Manage VTE (Venous Thromboembolism) Risk  Recent Flowsheet Documentation  Taken 10/29/2019 2000 by Nile Dear, RN  Activity Management: activity adjusted per tolerance  Goal: Optimal Comfort and Wellbeing  Outcome: Progressing  Goal: Readiness for Transition of Care  Outcome: Progressing  Goal: Rounds/Family Conference  Outcome: Progressing     Problem: Fall Injury Risk  Goal: Absence of Fall and Fall-Related Injury  Outcome: Progressing  Intervention: Promote Injury-Free Environment  Recent Flowsheet Documentation  Taken 10/29/2019 2000 by Nile Dear, RN  Safety Interventions:  ??? commode/urinal/bedpan at bedside  ??? fall reduction program maintained  ??? lighting adjusted for tasks/safety  ??? low bed  ??? nonskid shoes/slippers when out of bed  ??? seizure precautions     Problem: Self-Care Deficit  Goal: Improved Ability to Complete Activities of Daily Living  Outcome: Progressing     Problem: Hypertension Comorbidity  Goal: Blood Pressure in Desired Range  Outcome: Progressing     Problem: Seizure Disorder Comorbidity  Goal: Maintenance of Seizure Control  Outcome: Progressing  Intervention: Maintain Seizure-Symptom Control  Recent Flowsheet Documentation  Taken 10/29/2019 2000 by Nile Dear, RN  Seizure Precautions:  ??? activity supervised  ??? side rails padded     Problem: Skin Injury Risk Increased  Goal: Skin Health and Integrity  Outcome: Progressing  Intervention: Optimize Skin Protection  Recent Flowsheet Documentation  Taken 10/29/2019 2000 by Nile Dear, RN  Pressure Reduction Techniques: frequent weight shift encouraged  Head of Bed (HOB) Positioning: HOB elevated  Pressure Reduction Devices: pressure-redistributing  mattress utilized  Skin Protection:  ??? adhesive use limited  ??? incontinence pads utilized     Problem: Infection  Goal: Absence of Infection Signs and Symptoms  Outcome: Progressing

## 2019-10-30 NOTE — Unmapped (Signed)
Medicine Daily Progress Note    Assessment/Plan:  Principal Problem:    Alcohol withdrawal seizure (CMS-HCC)  Active Problems:    Alcohol withdrawal syndrome, with delirium (CMS-HCC)    Essential hypertension    Thrombocytopenia (CMS-HCC)    Macrocytic anemia    Hyponatremia    Hypomagnesemia  Resolved Problems:    * No resolved hospital problems. *           Nicholas Rush is a 54 y.o. male who presented to [facility] with Alcohol withdrawal seizure (CMS-HCC).    Delirium  See below re: initial presentation for alcohol withdrawal. Withdrawal symptoms resolved but has continued to have waxing and waning confusion and intermittent agitation felt most consistent with protracted delirium. Considered multiple potential contributing causes but evaluation has been unrevealing including head CT with no acute process, no evidence of cirrhosis on labs or imaging, no evidence of acute infection including COVID or on CXR or UA. Trialed low dose benzo on 10/9 and 10/10 to see if due to some continued EtOH withdrawal but seemed to make the confusion worse consistent with delirium rather than withdrawal. May also have some degree of Wernicke's and/or baseline cognitive impairment at this point. OT performed living skills assessment which does suggest cognitive impairment.   - Gave high dose thiamine 500mg  IV q8H for 3 days (10/9 - 10/11). Will continue 100mg  PO daily  - Delirium precautions. Has been sleeping poorly and normalizing day/night has been a challenge on 1 memorial.   - Increase melatonin to 6mg  nightly (10/11)  - Will schedule xyprexa (10/11) 2.5mg  BID. Can continue to use 5mg  PO as needed or haldol 2mg  IM/IV if needed for severe agitation. QTc has not been prolonged but will recheck.     Seizure in setting of alcohol withdrawal with delirium tremens:   Patient presented to the ED following seizure and had 2 additional witnessed seizures in the ED.  Last drink was approximately 12 hours prior to presentation on the morning of 9/30.  Was monitored with CIWA protocol which was discotinued 10/2.  No further seizures while admitted. Completed Librium taper.   - Seizure precautions  - Continue thiamine and folate  - Social work consulted for substance abuse resources    Hyponatremia, stable:    Sodium appears to be chronically low. 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 Cortisol normal, TSH low but FT4 WNL. Citalopram held on admission, resumed 10/8 thus far without any worsening in his mild hyponatremia.   - Can continue to monitor BMP pediodically  - Continue 1000 mL fluid restriction for now    HTN:   - Continue home lisinopril  ??  Macrocytic anemia: Appears chronic in setting of alcohol use.   - Continue to monitor  ??  Thrombocytopenia: Chronic in setting of alcohol use. Platelets stable currently.  - Continue to monitor  ??  Hypokalemia/hypomagnesemia:   - monitor and replace as needed, magnesium repleted IV on 10/9  ??  Reported history of C. Diff: Patient's wife reports a questionable recent diagnosis of C. Diff. Was prescribed vanc in July but never took it. No current diarrhea.     Dispo: PT/OT rec 5x weekly low. Social work working on discharge options as his insurance does not cover SNF. Hopeful that intermittent agitation will improve prior to DC to  SNF.   ___________________________________________________________________    Subjective / Interval events:  Yesterday afternoon a couple hours after ativan he got out of bed, urinated on himself, and became more confused. Overnight became agitated and tried to leave the hospital. Had no IV available and xyprexa was not on unit so was given IM haldol with goo results. Denies any complaints this AM. Did a better job this morning telling me why he was in the hospital - 'because I had seizures and had been drinking too much'. During times he is more lucid is still stating he wants to quit drinking. Tells me he watched a four hour movie last night. Tells me he needs to leave the hospital tomorrow because he has an appointment with the therapists for the next month (I clarified we do not feel its safe for him to leave and that yes we're working on finding a rehab facility but it is not currently planned for tomorrow). He recounts feeling more calm after they gave me that shot. Agreeable to trying xyprexa to help keep him more calm and help his confusion.       Labs/Studies:  Labs and Studies from the last 24hrs per EMR and Reviewed    Objective:  Temp:  [36.7 ??C (98.1 ??F)-37.2 ??C (99 ??F)] 36.7 ??C (98.1 ??F)  Heart Rate:  [75-77] 75  Resp:  [18] 18  BP: (158-159)/(91-95) 158/91  SpO2:  [99 %-100 %] 100 %    GEN: Lying in bed, calm and in no acute distress, see subjective  HEENT: sclera anicteric, moist mucous membranes, +anisocoria with left pupil ~10mm and right ~16mm - patient reports this is chronic and has been documented previously  CV: RRR, no murmur  RESP: CTAB, normal WOB  ABD: soft, non-tender, non-distended  EXT: no LE edema  NEURO: non-focal, strength and sensation intact throughout.

## 2019-10-31 MED ADMIN — OLANZapine zydis (ZyPREXA) disintegrating tablet 5 mg: 5 mg | ORAL | @ 20:00:00

## 2019-10-31 MED ADMIN — thiamine (B-1) tablet 100 mg: 100 mg | ORAL | @ 13:00:00

## 2019-10-31 MED ADMIN — cetirizine (ZyrTEC) tablet 10 mg: 10 mg | ORAL | @ 13:00:00

## 2019-10-31 MED ADMIN — lisinopriL (PRINIVIL,ZESTRIL) tablet 20 mg: 20 mg | ORAL | @ 13:00:00

## 2019-10-31 MED ADMIN — OLANZapine zydis (ZyPREXA) disintegrating tablet 2.5 mg: 2.5 mg | ORAL

## 2019-10-31 MED ADMIN — melatonin tablet 6 mg: 6 mg | ORAL | @ 23:00:00

## 2019-10-31 MED ADMIN — OLANZapine zydis (ZyPREXA) disintegrating tablet 2.5 mg: 2.5 mg | ORAL | @ 13:00:00

## 2019-10-31 MED ADMIN — enoxaparin (LOVENOX) syringe 40 mg: 40 mg | SUBCUTANEOUS | @ 02:00:00

## 2019-10-31 MED ADMIN — thiamine (B-1) 500 mg in sodium chloride (NS) 0.9 % 100 mL IVPB: 500 mg | INTRAVENOUS | @ 02:00:00 | Stop: 2019-10-30

## 2019-10-31 MED ADMIN — haloperidol lactate (HALDOL) injection 2 mg: 2 mg | INTRAVENOUS | @ 02:00:00 | Stop: 2019-10-30

## 2019-10-31 NOTE — Unmapped (Signed)
Pt has no IV medications at this time. Pt is pulling out IVs. RN will replace order when IV meds are ordered.

## 2019-10-31 NOTE — Unmapped (Signed)
Medicine Daily Progress Note    Assessment/Plan:  Principal Problem:    Alcohol withdrawal seizure (CMS-HCC)  Active Problems:    Alcohol withdrawal syndrome, with delirium (CMS-HCC)    Essential hypertension    Thrombocytopenia (CMS-HCC)    Macrocytic anemia    Hyponatremia    Hypomagnesemia  Resolved Problems:    * No resolved hospital problems. *           Nicholas Rush is a 54 y.o. male who presented to [facility] with Alcohol withdrawal seizure (CMS-HCC).    Delirium  Presented with ETOH withdrawal, now with waxing and waning confusion, somewhat improved today. Heat CT negative. No evidence of cirrhosis on labs or imaging, no evidence of acute infection including COVID or on CXR or UA. Trialed low dose benzo on 10/9 and 10/10 to see if due to some continued EtOH withdrawal but seemed to make the confusion worse consistent with delirium rather than withdrawal. May also have some degree of Wernicke's and/or baseline cognitive impairment at this point. OT performed living skills assessment which does suggest cognitive impairment. Received thiamine 500mg  IV Q8H x 3 days.   -Continue scheduled xyprexa 2.5mg  BID. Haldol 2mg  IV or IM prn, seems to have helped last night. QTc wnl.   -Continue melatonin.   -Delirium precautions.   ??  Seizure in setting of alcohol withdrawal with delirium tremens:   Patient presented to the ED following seizure and had 2 additional witnessed seizures in the ED.  Last drink was approximately 12 hours prior to presentation on the morning of 9/30.  Was monitored with CIWA protocol which was discotinued 10/2.  No further seizures while admitted. Completed Librium taper.   - Seizure precautions  - Continue thiamine and folate  - Social work consulted for substance abuse resources  ??  Hyponatremia, stable:   Sodium appears to be chronically low. 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 Cortisol normal, TSH low but FT4 WNL. Citalopram held on admission, resumed 10/8 thus far without any worsening in his mild hyponatremia.   -Can continue to monitor BMP pediodically  -Continue 1000 mL fluid restriction for now  ??  HTN  BP stable.   -Continue home lisinopril  ??  Macrocytic anemia  Appears chronic in setting of alcohol use.   ??  Thrombocytopenia  Chronic in setting of alcohol use. Platelets stable currently.  - Continue to monitor  ??  Hypokalemia/hypomagnesemia  -Monitor and replace as needed, magnesium repleted IV on 10/9  ??  Reported history of C. Diff:??Patient's wife reports a questionable recent diagnosis of C. Diff. Was prescribed vanc in July but never took it. No current diarrhea.   ??  Dispo: PT/OT rec 5x weekly low. Social work working on discharge options as his insurance does not cover SNF. Hopeful that intermittent agitation will improve prior to DC to SNF.     ___________________________________________________________________    Subjective:  Agitated overnight despite zyprexa, required IV haldol. Per nurse, significantly improved today. Though process more clear. Pt is without complaints. Trying to order his lunch. Interested in going somewhere to get stronger for 6 weeks.     Labs/Studies:    All lab results last 24 hours:    Recent Results (from the past 24 hour(s))   ECG 12 Lead    Collection Time: 10/30/19  3:47 PM   Result Value Ref Range    EKG Systolic BP  mmHg    EKG Diastolic BP  mmHg    EKG Ventricular Rate 77 BPM    EKG Atrial Rate 77 BPM    EKG P-R Interval 122 ms    EKG QRS Duration 76 ms    EKG Q-T Interval 398 ms    EKG QTC Calculation 450 ms    EKG Calculated P Axis 29 degrees    EKG Calculated R Axis 25 degrees    EKG Calculated T Axis 32 degrees    QTC Fredericia 432 ms       Objective:  Temp:  [36.7 ??C (98.1 ??F)-37.3 ??C (99.1 ??F)] 36.7 ??C (98.1 ??F)  Heart Rate:  [80-90] 80  Resp:  [18] 18  BP: (137-146)/(86-96) 146/86  SpO2:  [96 %-100 %] 96 %    GEN: No acute distress  HEENT: Mucous membranes moist  CV: Regular rate and rhythm, no murmurs, rubs, or gallops  PULM: Clear to auscultation bilaterally  ABD: Soft, non-tender, non-distended, positive bowel sounds, no rebound or guarding  EXT: No lower extremity edema  NEURO: Alert, oriented to place, able to tell me why he is hospitalized (seizure)

## 2019-10-31 NOTE — Unmapped (Signed)
Pt resting in bed. VSS. No ASD noted. Disoriented to situation. Medications administered per order. Pt pleasant throughout shift, but very impulsive and getting up frequently despite attempts at redirection. Provider paged, order for haldol placed and given w/ pt sleeping on reassessment. At about 0400, pt awoke and took out his IV. No further IV meds at this time, provider paged and okay no PIV order placed. Bed in low, call bell within reach. WCTM.     Problem: Adult Inpatient Plan of Care  Goal: Plan of Care Review  Outcome: Progressing  Goal: Patient-Specific Goal (Individualized)  Outcome: Progressing  Goal: Absence of Hospital-Acquired Illness or Injury  Outcome: Progressing  Intervention: Identify and Manage Fall Risk  Recent Flowsheet Documentation  Taken 10/30/2019 2000 by Nile Dear, RN  Safety Interventions:   aspiration precautions   commode/urinal/bedpan at bedside   fall reduction program maintained   lighting adjusted for tasks/safety   low bed   nonskid shoes/slippers when out of bed   bed alarm   seizure precautions   room near unit station  Intervention: Prevent Skin Injury  Recent Flowsheet Documentation  Taken 10/30/2019 2000 by Nile Dear, RN  Skin Protection:   adhesive use limited   incontinence pads utilized  Intervention: Prevent and Manage VTE (Venous Thromboembolism) Risk  Recent Flowsheet Documentation  Taken 10/30/2019 2000 by Nile Dear, RN  Activity Management: activity adjusted per tolerance  Goal: Optimal Comfort and Wellbeing  Outcome: Progressing  Goal: Readiness for Transition of Care  Outcome: Progressing  Goal: Rounds/Family Conference  Outcome: Progressing     Problem: Fall Injury Risk  Goal: Absence of Fall and Fall-Related Injury  Outcome: Progressing  Intervention: Promote Injury-Free Environment  Recent Flowsheet Documentation  Taken 10/30/2019 2000 by Nile Dear, RN  Safety Interventions:   aspiration precautions   commode/urinal/bedpan at bedside fall reduction program maintained   lighting adjusted for tasks/safety   low bed   nonskid shoes/slippers when out of bed   bed alarm   seizure precautions   room near unit station     Problem: Self-Care Deficit  Goal: Improved Ability to Complete Activities of Daily Living  Outcome: Progressing     Problem: Hypertension Comorbidity  Goal: Blood Pressure in Desired Range  Outcome: Progressing     Problem: Seizure Disorder Comorbidity  Goal: Maintenance of Seizure Control  Outcome: Progressing  Intervention: Maintain Seizure-Symptom Control  Recent Flowsheet Documentation  Taken 10/30/2019 2000 by Nile Dear, RN  Seizure Precautions:   activity supervised   side rails padded     Problem: Skin Injury Risk Increased  Goal: Skin Health and Integrity  Outcome: Progressing  Intervention: Optimize Skin Protection  Recent Flowsheet Documentation  Taken 10/30/2019 2000 by Nile Dear, RN  Pressure Reduction Techniques: frequent weight shift encouraged  Head of Bed (HOB) Positioning: HOB elevated  Pressure Reduction Devices: pressure-redistributing mattress utilized  Skin Protection:   adhesive use limited   incontinence pads utilized     Problem: Infection  Goal: Absence of Infection Signs and Symptoms  Outcome: Progressing

## 2019-11-01 MED ADMIN — melatonin tablet 6 mg: 6 mg | ORAL | @ 23:00:00

## 2019-11-01 MED ADMIN — lisinopriL (PRINIVIL,ZESTRIL) tablet 20 mg: 20 mg | ORAL | @ 13:00:00

## 2019-11-01 MED ADMIN — citalopram (CeleXA) tablet 10 mg: 10 mg | ORAL | @ 13:00:00

## 2019-11-01 MED ADMIN — enoxaparin (LOVENOX) syringe 40 mg: 40 mg | SUBCUTANEOUS | @ 01:00:00

## 2019-11-01 NOTE — Unmapped (Signed)
Medicine Daily Progress Note    Assessment/Plan:  Principal Problem:    Alcohol withdrawal seizure (CMS-HCC)  Active Problems:    Alcohol withdrawal syndrome, with delirium (CMS-HCC)    Essential hypertension    Thrombocytopenia (CMS-HCC)    Macrocytic anemia    Hyponatremia    Hypomagnesemia  Resolved Problems:    * No resolved hospital problems. *         Nicholas Rush is a 54 y.o. male who presented to [facility] with Alcohol withdrawal seizure (CMS-HCC).  ??  Delirium  Presented with ETOH withdrawal, now with waxing and waning confusion, somewhat improved today. Head CT negative. No evidence of cirrhosis on labs or imaging, no evidence of acute infection including COVID or on CXR or UA. Trialed low dose benzo on 10/9 and 10/10 to see if due to some continued??EtOH withdrawal but??seemed to make the confusion worse consistent with delirium rather than withdrawal. May also have some degree of Wernicke's and/or baseline cognitive impairment at this point. OT performed living skills assessment which??does suggest??cognitive impairment. Received thiamine 500mg  IV Q8H x 3 days. Mental status improved last 2 days.   -Continue scheduled xyprexa 2.5mg  BID. Haldol 2mg  IV or IM prn, seems to have helped last night. QTc wnl.   -Continue melatonin.   -Delirium precautions.   ??  Seizure in setting of alcohol withdrawal with delirium tremens:   Patient presented to the ED following seizure and had 2 additional witnessed seizures in the ED. ??Last drink was approximately 12 hours prior to presentation on the morning of 9/30. ??Was monitored with CIWA protocol which was discotinued 10/2. ??No further seizures while admitted. Completed Librium taper.??  - Seizure precautions  - Continue thiamine and folate  - Social work consulted??for substance abuse resources  ??  Hyponatremia, stable:   Sodium appears to be chronically low. 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 Cortisol normal, TSH low but FT4 WNL. Citalopram held on admission, resumed 10/8??thus far without any worsening in his mild hyponatremia.   -Can continue to monitor BMP pediodically  -Continue 1000 mL fluid restriction for now  ??  HTN  BP stable.   -Continue home lisinopril  ??  Macrocytic anemia  Appears chronic in setting of alcohol use.   ??  Thrombocytopenia  Chronic in setting of alcohol use. Platelets stable currently.  - Continue to monitor  ??  Hypokalemia/hypomagnesemia  -Monitor and replace as needed,??magnesium??repleted IV on 10/9  ??  Reported history of C. Diff:??Patient's wife reports a questionable recent diagnosis of C. Diff. Was prescribed vanc in July but never took it. No current diarrhea.   ??  Dispo: PT/OT rec 5x weekly low. Social work working on discharge options as his insurance does not cover SNF.??    ___________________________________________________________________    Subjective:  No acute events overnight    Labs/Studies:  All lab results last 24 hours:  No results found for this or any previous visit (from the past 24 hour(s)).      Objective:  Temp:  [36.7 ??C (98.1 ??F)-36.9 ??C (98.4 ??F)] 36.9 ??C (98.4 ??F)  Heart Rate:  [78-107] 107  Resp:  [18-20] 18  BP: (114-149)/(73-86) 114/73  SpO2:  [95 %-100 %] 95 %    GEN: No acute distress  HEENT: Mucous membranes moist  CV: Regular rate and  rhythm, no murmurs, rubs, or gallops  PULM: Clear to auscultation bilaterally  ABD: Soft, non-tender, non-distended, positive bowel sounds, no rebound or guarding  EXT: No lower extremity edema  NEURO: Alert, oriented to place, knows he is in the hospital for a seizure and alcohol withdrawal

## 2019-11-01 NOTE — Unmapped (Addendum)
Pt resting in bed, eyes closed. Continues to be disoriented to situation. VSS. No ASD noted. Continues to be impulsive and getting up from bed/chair multiple times. Having intermittent moments of confusion, at one point pt was putting objects into his bedside commode, however he is more redirectable this shift. No need for prn meds thus far, and has been sleeping throughout most of the night. Scheduled medications administered. Tolerated solid meal. Safety maintained. Bed alarm activated and audible. Bed in low, call bell within reach. WCTM.     Addendum: Pt had a very good night. He went to bed around 2300 and is still currently sleeping. He appropriately used his call bell when he needed to use his urinal. WCTM.     Problem: Adult Inpatient Plan of Care  Goal: Plan of Care Review  Outcome: Progressing  Goal: Patient-Specific Goal (Individualized)  Outcome: Progressing  Goal: Absence of Hospital-Acquired Illness or Injury  Outcome: Progressing  Intervention: Identify and Manage Fall Risk  Recent Flowsheet Documentation  Taken 10/31/2019 2000 by Nile Dear, RN  Safety Interventions:  ??? commode/urinal/bedpan at bedside  ??? fall reduction program maintained  ??? lighting adjusted for tasks/safety  ??? low bed  ??? nonskid shoes/slippers when out of bed  ??? seizure precautions  ??? bed alarm  ??? aspiration precautions  ??? family at bedside  Intervention: Prevent Skin Injury  Recent Flowsheet Documentation  Taken 10/31/2019 2100 by Nile Dear, RN  Skin Protection:  ??? adhesive use limited  ??? incontinence pads utilized  Taken 10/31/2019 2000 by Nile Dear, RN  Skin Protection:  ??? adhesive use limited  ??? incontinence pads utilized  Goal: Optimal Comfort and Wellbeing  Outcome: Progressing  Goal: Readiness for Transition of Care  Outcome: Progressing  Goal: Rounds/Family Conference  Outcome: Progressing     Problem: Fall Injury Risk  Goal: Absence of Fall and Fall-Related Injury  Outcome: Progressing  Intervention: Promote Injury-Free Environment  Recent Flowsheet Documentation  Taken 10/31/2019 2000 by Nile Dear, RN  Safety Interventions:  ??? commode/urinal/bedpan at bedside  ??? fall reduction program maintained  ??? lighting adjusted for tasks/safety  ??? low bed  ??? nonskid shoes/slippers when out of bed  ??? seizure precautions  ??? bed alarm  ??? aspiration precautions  ??? family at bedside     Problem: Self-Care Deficit  Goal: Improved Ability to Complete Activities of Daily Living  Outcome: Progressing     Problem: Hypertension Comorbidity  Goal: Blood Pressure in Desired Range  Outcome: Progressing     Problem: Seizure Disorder Comorbidity  Goal: Maintenance of Seizure Control  Outcome: Progressing  Intervention: Maintain Seizure-Symptom Control  Recent Flowsheet Documentation  Taken 10/31/2019 2000 by Nile Dear, RN  Seizure Precautions:  ??? activity supervised  ??? side rails padded     Problem: Skin Injury Risk Increased  Goal: Skin Health and Integrity  Outcome: Progressing  Intervention: Optimize Skin Protection  Recent Flowsheet Documentation  Taken 10/31/2019 2100 by Nile Dear, RN  Pressure Reduction Techniques: frequent weight shift encouraged  Pressure Reduction Devices: pressure-redistributing mattress utilized  Skin Protection:  ??? adhesive use limited  ??? incontinence pads utilized  Taken 10/31/2019 2000 by Nile Dear, RN  Pressure Reduction Techniques: frequent weight shift encouraged  Pressure Reduction Devices: pressure-redistributing mattress utilized  Skin Protection:  ??? adhesive use limited  ??? incontinence pads utilized     Problem: Infection  Goal: Absence of Infection Signs and Symptoms  Outcome: Progressing

## 2019-11-02 LAB — HIV ANTIGEN/ANTIBODY COMBO: HIV 1+2 Ab+HIV1 p24 Ag:PrThr:Pt:Ser/Plas:Ord:IA: NONREACTIVE

## 2019-11-02 MED ADMIN — thiamine (B-1) tablet 100 mg: 100 mg | ORAL | @ 14:00:00

## 2019-11-02 MED ADMIN — cetirizine (ZyrTEC) tablet 10 mg: 10 mg | ORAL | @ 14:00:00

## 2019-11-02 MED ADMIN — enoxaparin (LOVENOX) syringe 40 mg: 40 mg | SUBCUTANEOUS

## 2019-11-02 MED ADMIN — OLANZapine zydis (ZyPREXA) disintegrating tablet 5 mg: 5 mg | ORAL | @ 04:00:00

## 2019-11-02 MED ADMIN — melatonin tablet 6 mg: 6 mg | ORAL | @ 23:00:00

## 2019-11-02 MED ADMIN — lisinopriL (PRINIVIL,ZESTRIL) tablet 20 mg: 20 mg | ORAL | @ 14:00:00

## 2019-11-02 MED ADMIN — citalopram (CeleXA) tablet 10 mg: 10 mg | ORAL | @ 14:00:00

## 2019-11-02 MED ADMIN — OLANZapine zydis (ZyPREXA) disintegrating tablet 2.5 mg: 2.5 mg | ORAL

## 2019-11-02 NOTE — Unmapped (Signed)
Medicine Daily Progress Note    Assessment/Plan:  Principal Problem:    Alcohol withdrawal seizure (CMS-HCC)  Active Problems:    Alcohol withdrawal syndrome, with delirium (CMS-HCC)    Essential hypertension    Thrombocytopenia (CMS-HCC)    Macrocytic anemia    Hyponatremia    Hypomagnesemia  Resolved Problems:    * No resolved hospital problems. Nicholas Rush??is a 54 y.o.??male??who presented to [facility] with Alcohol withdrawal seizure (CMS-HCC).  ??  Delirium/AMS  Presented with ETOH withdrawal, now with waxing and waning confusion and agitation. This has slowly improved overall, less agitation, more coherent thought processes, though still not at baseline. Head CT negative for acute process, notable for small vessel disease and global atrophy. No evidence of cirrhosis on labs or imaging, no evidence of acute infection including COVID or on CXR or UA. TSH wnl. HIV negative, Syphilis screen negative. Trialed low dose benzo on 10/9 and 10/10 but this worsened confusion. Received thiamine 500mg  IV Q8H x 3 days. Improvement after starting xyprexa. Concern for some degree of baseline cognitive impairment due to long-term ETOH use.  -Continue zyprexa, increase to 5mg  at bedtime. Continue zyprexa prn. QTC wnl.   -Continue melatonin.   -Delirium precautions.??  ??  Seizure in setting of alcohol withdrawal with delirium tremens:   Patient presented to the ED following seizure and had 2 additional witnessed seizures in the ED. ??Last drink was approximately 12 hours prior to presentation on the morning of 9/30. ??Was monitored with CIWA protocol which was discotinued 10/2. ??No further seizures while admitted. Completed Librium taper.??  - Seizure precautions  - Continue thiamine and folate  - Social work consulted??for substance abuse resources  ??  Hyponatremia, stable  Sodium appears to be chronically low. 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 Cortisol normal, TSH low but FT4 WNL. Citalopram held on admission, resumed 10/8??thus far without any worsening in his mild hyponatremia.   -Repeat BMP in am.   ??  HTN  BP stable.??  -Continue lisinopril.  ??  Macrocytic anemia  Appears chronic in setting of alcohol use.   ??  Thrombocytopenia  Chronic in setting of alcohol use. Platelets stable currently.  ??  Hypokalemia/hypomagnesemia  -Repeat BMP, Mg in am.   ??  Reported history of C. Diff:??Patient's wife reports a questionable recent diagnosis of C. Diff. Was prescribed vanc in July but never took it. No current diarrhea.   ??  Dispo: Improving with PT/OT. Will likely progress to going home, will need 24/7 help. Family not able to provide what he needs with his current level of mentation.   ___________________________________________________________________    Subjective:  No acute events overnight. Per nurse, agitated this morning, confused, insists he is ready to go today.     Labs/Studies:  All lab results last 24 hours:  No results found for this or any previous visit (from the past 24 hour(s)).    Objective:  Temp:  [36.9 ??C (98.4 ??F)-37.2 ??C (99 ??F)] 36.9 ??C (98.4 ??F)  Heart Rate:  [85-93] 85  Resp:  [16-20] 16  BP: (125-147)/(90-100) 147/100  SpO2:  [99 %-100 %] 100 %    GEN: No acute distress  CV: Regular rate and rhythm, no murmurs, rubs, or gallops  PULM: Clear to  auscultation bilaterally  ABD: Soft, non-tender, non-distended, positive bowel sounds, no rebound or guarding  EXT: No lower extremity edema  NEURO: Alert, oriented to place, knows why he is in the hospital, perseverating on leaving the hospital today, though not able to clearly state where he is going

## 2019-11-02 NOTE — Unmapped (Addendum)
Patient AAOx3, VSS, and afebrile. Patient restless for most of the night. Patient anxious to leave and called girlfriend to pick him up. Bed alarm activated, call bell within reach, and bed low. No falls during this shift.   Problem: Adult Inpatient Plan of Care  Goal: Plan of Care Review  Outcome: Ongoing - Unchanged  Goal: Patient-Specific Goal (Individualized)  Outcome: Ongoing - Unchanged  Goal: Absence of Hospital-Acquired Illness or Injury  Outcome: Ongoing - Unchanged  Intervention: Identify and Manage Fall Risk  Recent Flowsheet Documentation  Taken 11/01/2019 2000 by Walker Shadow, RN  Safety Interventions:  ??? chair alarm  ??? environmental modification  ??? fall reduction program maintained  ??? low bed  ??? lighting adjusted for tasks/safety  ??? nonskid shoes/slippers when out of bed  ??? room near unit station  Intervention: Prevent Skin Injury  Recent Flowsheet Documentation  Taken 11/01/2019 2000 by Walker Shadow, RN  Skin Protection:  ??? adhesive use limited  ??? incontinence pads utilized  Intervention: Prevent and Manage VTE (Venous Thromboembolism) Risk  Recent Flowsheet Documentation  Taken 11/01/2019 2000 by Walker Shadow, RN  Activity Management: up in chair  VTE Prevention/Management:  ??? anticoagulant therapy  ??? bleeding precautions maintained  ??? bleeding risk factors identified  ??? ambulation promoted  Goal: Optimal Comfort and Wellbeing  Outcome: Ongoing - Unchanged  Goal: Readiness for Transition of Care  Outcome: Ongoing - Unchanged  Goal: Rounds/Family Conference  Outcome: Ongoing - Unchanged     Problem: Fall Injury Risk  Goal: Absence of Fall and Fall-Related Injury  Outcome: Ongoing - Unchanged  Intervention: Promote Injury-Free Environment  Recent Flowsheet Documentation  Taken 11/01/2019 2000 by Walker Shadow, RN  Safety Interventions:  ??? chair alarm  ??? environmental modification  ??? fall reduction program maintained  ??? low bed  ??? lighting adjusted for tasks/safety  ??? nonskid shoes/slippers when out of bed  ??? room near unit station     Problem: Self-Care Deficit  Goal: Improved Ability to Complete Activities of Daily Living  Outcome: Ongoing - Unchanged     Problem: Hypertension Comorbidity  Goal: Blood Pressure in Desired Range  Outcome: Ongoing - Unchanged     Problem: Seizure Disorder Comorbidity  Goal: Maintenance of Seizure Control  Outcome: Ongoing - Unchanged  Intervention: Maintain Seizure-Symptom Control  Recent Flowsheet Documentation  Taken 11/01/2019 2000 by Walker Shadow, RN  Seizure Precautions: side rails padded     Problem: Skin Injury Risk Increased  Goal: Skin Health and Integrity  Outcome: Ongoing - Unchanged  Intervention: Optimize Skin Protection  Recent Flowsheet Documentation  Taken 11/01/2019 2000 by Walker Shadow, RN  Pressure Reduction Techniques: frequent weight shift encouraged  Skin Protection:  ??? adhesive use limited  ??? incontinence pads utilized     Problem: Infection  Goal: Absence of Infection Signs and Symptoms  Outcome: Ongoing - Unchanged

## 2019-11-02 NOTE — Unmapped (Signed)
Patient has had a good day, he has been up in the chair all day with alarm set. Pt has short term memory problem and is very impulsive. He has been A/Ox 3 but will then ramble random words and talk about things that make no sense and says things about he discharge that we know nothing about. His parents came today and he did well while they were here. Patient walked with PT today he used a walker, more steady on his feet but still needs someone to walk with him. Patient is very busy and does not want to stay seated he must be reminded very often in a very short time to sit down and call for help. NAD noted, VSS, WCTM.

## 2019-11-03 LAB — CBC
HEMATOCRIT: 36.3 % — ABNORMAL LOW (ref 41.0–53.0)
HEMOGLOBIN: 12 g/dL — ABNORMAL LOW (ref 13.5–17.5)
MEAN CORPUSCULAR HEMOGLOBIN CONC: 33 g/dL (ref 31.0–37.0)
MEAN CORPUSCULAR HEMOGLOBIN: 36.3 pg — ABNORMAL HIGH (ref 26.0–34.0)
MEAN CORPUSCULAR VOLUME: 109.8 fL — ABNORMAL HIGH (ref 80.0–100.0)
MEAN PLATELET VOLUME: 9.5 fL (ref 7.0–10.0)
PLATELET COUNT: 418 10*9/L (ref 150–440)
RED CELL DISTRIBUTION WIDTH: 13.2 % (ref 12.0–15.0)
WBC ADJUSTED: 6.8 10*9/L (ref 4.5–11.0)

## 2019-11-03 LAB — BASIC METABOLIC PANEL
ANION GAP: 10 mmol/L (ref 5–14)
BLOOD UREA NITROGEN: 12 mg/dL (ref 9–23)
BUN / CREAT RATIO: 15
CALCIUM: 9.1 mg/dL (ref 8.7–10.4)
CO2: 23 mmol/L (ref 20.0–31.0)
CREATININE: 0.79 mg/dL
EGFR CKD-EPI AA MALE: >90 mL/min/{1.73_m2} (ref >=60)
EGFR CKD-EPI NON-AA MALE: >90 mL/min/{1.73_m2} (ref >=60)
POTASSIUM: 3.5 mmol/L (ref 3.4–4.5)
SODIUM: 131 mmol/L — ABNORMAL LOW (ref 135–145)

## 2019-11-03 LAB — GLUCOSE RANDOM: Glucose:MCnc:Pt:Ser/Plas:Qn:: 91

## 2019-11-03 LAB — MAGNESIUM: Magnesium:MCnc:Pt:Ser/Plas:Qn:: 1.4 — ABNORMAL LOW

## 2019-11-03 LAB — VITAMIN B-12: Cobalamins:MCnc:Pt:Ser/Plas:Qn:: 482

## 2019-11-03 LAB — RED CELL DISTRIBUTION WIDTH: Lab: 13.2

## 2019-11-03 MED ADMIN — folic acid (FOLVITE) tablet 1 mg: 1 mg | ORAL | @ 14:00:00 | Stop: 2019-11-03

## 2019-11-03 MED ADMIN — calcium carbonate (TUMS) chewable tablet 200 mg of elem calcium: 200 mg | ORAL | @ 02:00:00 | Stop: 2019-11-02

## 2019-11-03 MED ADMIN — magnesium oxide (MAG-OX) tablet 400 mg: 400 mg | ORAL | @ 14:00:00 | Stop: 2019-11-03

## 2019-11-03 MED ADMIN — nicotine (NICODERM CQ) 14 mg/24 hr patch 1 patch: 1 | TRANSDERMAL | @ 14:00:00 | Stop: 2019-11-03

## 2019-11-03 MED ADMIN — OLANZapine zydis (ZyPREXA) disintegrating tablet 5 mg: 5 mg | ORAL | @ 02:00:00

## 2019-11-03 MED ADMIN — nicotine (NICODERM CQ) 14 mg/24 hr patch 1 patch: 1 | TRANSDERMAL | @ 02:00:00

## 2019-11-03 NOTE — Unmapped (Signed)
You have been referred to:  [facility]  [address]  [place], [place] [address]  [phone]  They will reach out to you regarding referral, but please reach out if you do not hear back or are interested in updates    Please consider:  [facility]  Slots available Mon- Friday from 9am -1:30pm  [address]  [place] [address]  [phone]  Fax [phone]

## 2019-11-03 NOTE — Unmapped (Addendum)
You have been accepted for Home Health services with:  [facility] ??[facility] ??& Wellness   Spoke w/KELLY   Referral accepted for [facility] 10/18   [phone]

## 2019-11-03 NOTE — Unmapped (Addendum)
Physician Discharge Summary [facility]  [facility] OBSERVATION [facility]  [address]  [place] [address]  Dept: [phone]  Loc: [phone]     Identifying Information:   Nicholas Rush  1965-06-05  [id number]    Primary Care Physician: Blake Divine, MD   Code Status: Full Code    Admit Date: 10/19/2019    Discharge Date: 11/03/2019     Discharge To: Home with Home Health and/or PT/OT    Discharge Service: [facility] - Hospitalist [place]     Discharge Attending Physician: Herby Abraham, MD    Discharge Diagnoses:  Principal Problem:    Alcohol withdrawal seizure (CMS-HCC) POA: Yes  Active Problems:    Alcohol withdrawal syndrome, with delirium (CMS-HCC) POA: Yes    Essential hypertension POA: Yes    Thrombocytopenia (CMS-HCC) POA: Yes    Macrocytic anemia POA: Yes    Hyponatremia POA: Yes    Hypomagnesemia POA: Yes  Resolved Problems:    * No resolved hospital problems. *      Outpatient Provider Follow Up Issues:   Referral to neuropsych placed for formal neuropsych evaluation, concern for ongoing cognitive impairment due to alcoholism.    Hospital Course:   Seizure in setting of alcohol withdrawal with delirium tremens  Patient presented to the ED following seizure and had 2 additional witnessed seizures in the ED.  Last drink was approximately 12 hours prior to presentation on the morning of 9/30.  He was monitored with CIWA protocol; ultimately CIWA scores were all low and PRN Ativan was stopped due to concern it was causing worsened deliruim.  No further seizures while admitted. He was treated with a Librium taper, last dose 10/4 as well as briefly with Depakote 500 mg twice daily; last dose 10/4. He received thiamine and folate, including high dose IV thiamine for three days. Social work was consulted for substance abuse resources. See below re: protracted hospital delirium. He ultimately discharged to home with home health services.     Delirium  Continued to have waxing and waning confusion and intermittent agitation felt most consistent with protracted delirium. Considered multiple potential contributing causes but evaluation was unrevealing including head CT with no acute process, no evidence of hepatic encephalopathy, no evidence of acute infection including COVID or on CXR or UA, normal B12, TSH, syphilis screen, HIV. Trialed low dose benzo for ongoing withdrawal and this worsened symptoms. OT performed living skills assessment which did suggest cognitive impairment. He slowly improved to near his baseline, and suspect he may have some degree of ongoing cognitive impairment due to alcoholism, with global atrophy noted on head CT. He was started on scheduled olanzapine which improved his delirium. At discharge, he was able to ambulate, and his significant other [name] agreed to take him home with 24/7 supervision provided. Home health nursing, PT/OT was arranged.  ??  Hyponatremia, stable  Sodium 125 on admission, improved with IV fluids, and remained stable low 130s. 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 Cortisol normal, TSH low but FT4 WNL. Was placed on 1000 mL fluid restriction. Citalopram was briefly held but resumed with no change to his sodium.     HTN  Continued his home lisinopril    Hypokalemia/hypomagnesemia  Electrolytes were monitored and repleted as needed.    Procedures:  None  No admission procedures for hospital encounter.  ______________________________________________________________________  Discharge Medications:     Your Medication List      STOP taking these medications  cyclobenzaprine 10 MG tablet  Commonly known as: FLEXERIL     methocarbamoL 750 MG tablet  Commonly known as: ROBAXIN     peg-electrolyte soln 420 gram Solr  Commonly known as: NULYTELY     potassium gluconate 550 mg (90 mg) Tab     pseudoephedrine 30 MG tablet  Commonly known as: SUDAFED     traMADoL 50 mg tablet  Commonly known as: ULTRAM     vancomycin 125 MG capsule  Commonly known as: VANCOCIN        CONTINUE taking these medications    CALCIUM 500 + D ORAL  Take 1 tablet by mouth daily.     cetirizine 10 MG tablet  Commonly known as: ZyrTEC  Take 10 mg by mouth daily.     citalopram 10 MG tablet  Commonly known as: CeleXA  Take 10 mg by mouth daily.     empty container Misc  Use as directed to dispose of Humira syringes.     fluticasone propionate 50 mcg/actuation nasal spray  Commonly known as: FLONASE  2 sprays into each nostril daily.     folic acid 1 MG tablet  Commonly known as: FOLVITE  Take 1 mg by mouth daily.     halobetasol 0.05 % ointment  Commonly known as: ULTRAVATE  Apply topically Two (2) times a day. To affected areas as needed.     HUMIRA(CF) 40 mg/0.4 mL injection  Generic drug: adalimumab  Inject the contents of 1 syringe (40 mg) under the skin every 14 days     ibuprofen 200 MG tablet  Commonly known as: ADVIL,MOTRIN  Take 200 mg by mouth every six (6) hours as needed. takes 2 to 4 tabs as needed for pain     lisinopriL 20 MG tablet  Commonly known as: PRINIVIL,ZESTRIL  Take 1 tablet (20 mg total) by mouth daily.     magnesium oxide 400 mg (241.3 mg elemental) tablet  Commonly known as: MAG-OX  Take 1 tablet (400 mg total) by mouth daily.     nicotine 21 mg/24 hr patch  Commonly known as: NICODERM CQ  Place 1 patch on the skin daily. Remove old patch before applying new one.     omeprazole 20 MG capsule  Commonly known as: PriLOSEC  TAKE 1 CAPSULE(20MG  TOTAL) BY MOUTH EVERY DAY     sildenafiL (pulm.hypertension) 20 mg tablet  Commonly known as: REVATIO  2-5 tabs prior to intercourse as needed     THERA-M Tab  Generic drug: multivitamin,tx-iron-minerals  Take 1 tablet by mouth daily.     thiamine 100 MG tablet  Commonly known as: B-1  Take 1 tablet (100 mg total) by mouth daily.     vitamin B-6 100 MG tablet  Generic drug: pyridoxine (vitamin B6)  Take 100 mg by mouth daily. Allergies:  Hydroxyzine  ______________________________________________________________________  Pending Test Results (if blank, then none):      Most Recent Labs:  All lab results last 24 hours -   Recent Results (from the past 24 hour(s))   Basic Metabolic Panel    Collection Time: 11/03/19  3:31 AM   Result Value Ref Range    Sodium 131 (L) 135 - 145 mmol/L    Potassium 3.5 3.4 - 4.5 mmol/L    Chloride 98 98 - 107 mmol/L    CO2 23.0 20.0 - 31.0 mmol/L    Anion Gap 10 5 - 14 mmol/L    BUN 12 9 - 23 mg/dL  Creatinine 0.79 0.70 - 1.30 mg/dL    BUN/Creatinine Ratio 15     EGFR CKD-EPI Non-African American, Male >90 >=60 mL/min/1.31m2    EGFR CKD-EPI African American, Male >90 >=60 mL/min/1.83m2    Glucose 91 70 - 179 mg/dL    Calcium 9.1 8.7 - 16.1 mg/dL   Magnesium Level    Collection Time: 11/03/19  3:31 AM   Result Value Ref Range    Magnesium 1.4 (L) 1.6 - 2.6 mg/dL   Vitamin W96 Level    Collection Time: 11/03/19  3:31 AM   Result Value Ref Range    Vitamin B-12 482 211 - 911 pg/ml   CBC    Collection Time: 11/03/19  3:31 AM   Result Value Ref Range    WBC 6.8 4.5 - 11.0 10*9/L    RBC 3.31 (L) 4.50 - 5.90 10*12/L    HGB 12.0 (L) 13.5 - 17.5 g/dL    HCT 04.5 (L) 40.9 - 53.0 %    MCV 109.8 (H) 80.0 - 100.0 fL    MCH 36.3 (H) 26.0 - 34.0 pg    MCHC 33.0 31.0 - 37.0 g/dL    RDW 81.1 91.4 - 78.2 %    MPV 9.5 7.0 - 10.0 fL    Platelet 418 150 - 440 10*9/L       Relevant Studies/Radiology (if blank, then none):  ECG 12 Lead    Result Date: 10/30/2019  POOR DATA QUALITY, INTERPRETATION MAY BE ADVERSELY AFFECTED NORMAL SINUS RHYTHM NORMAL ECG WHEN COMPARED WITH ECG OF 19-Oct-2019 20:03, QRS AXIS SHIFTED LEFT  (LIKELY LIMB LEAD REVERSAL CORRECTED) Confirmed by Rose-jones, Lisa ([id number]) on 10/30/2019 4:22:51 PM    XR Chest Portable    Result Date: 10/29/2019  EXAM: XR CHEST PORTABLE DATE: 10/27/2019 5:19 PM ACCESSION: [id number] UN DICTATED: 10/29/2019 9:00 AM INTERPRETATION LOCATION: [facility]     CLINICAL INDICATION: 54 years old Male with COUGH      COMPARISON: 06/09/2019, left clavicle radiograph dated 07/04/2019     TECHNIQUE: AP portable view of the chest     FINDINGS: Cardiac and mediastinal contours are similar to prior. Trachea is midline. Lungs are clear. No pneumothorax or pleural effusion. Redemonstrated healing left distal clavicle fracture.         No acute cardiopulmonary abnormality.    CT Head Wo Contrast    Result Date: 10/28/2019  EXAM: Computed tomography, head or brain without contrast material. DATE: 10/27/2019 10:53 PM ACCESSION: [id number] UN DICTATED: 10/27/2019 11:08 PM INTERPRETATION LOCATION: [facility]     CLINICAL INDICATION: 54 years old Male with altered mental status      COMPARISON: Head CT 06/08/2019     TECHNIQUE: Axial CT images of the head  from skull base to vertex without contrast.     FINDINGS: Patchy regions of hypoattenuation within the bilateral periventricular and subcortical white matter, likely representing mild to moderate small vessel ischemic disease. Mild global volume loss with commensurate ventricular dilatation. This appears overall similar to prior studies.     There is no midline shift. No mass lesion. There is no evidence of acute infarct. No acute intracranial hemorrhage. No fractures are evident. Similar mucoperiosteal thickening of the right maxillary sinus. The sinuses are otherwise pneumatized. Carotid siphon calcifications.         -- No acute intracranial abnormalities.     -- Chronic small vessel ischemic changes and global volume loss.    US Abdomen Limited    Result Date: 10/28/2019  EXAM: US  ABDOMEN LIMITED DATE: 10/28/2019 4:34 PM ACCESSION: [id number] UN DICTATED: 10/28/2019 4:35 PM INTERPRETATION LOCATION: [facility]     CLINICAL INDICATION: 54 years old Male with RUQ, eval liver for ? cirrhosis      TECHNIQUE: Static and cine images of the right upper quadrant were performed.     COMPARISON: 06/09/2019 CT abdomen pelvis     FINDINGS:     LIVER: The liver shows increased echogenicity with normal contours. No focal hepatic lesions. No biliary ductal dilatation.     GALLBLADDER: The gallbladder is physiologically distended without internal stones or sludge. Sonographic Eulah Pont sign is negative.   No pericholecystic fluid. No gallbladder wall thickening.     LIMITED RIGHT KIDNEY: No hydronephrosis.         No gallstones or biliary dilatation. Increased echogenicity of the liver which could be seen secondary to fatty liver.     Please see below for data measurements:         Liver: 12.25 cm     Gallbladder wall: 0.21 cm Sonographic Murphy's Sign: patient medicated Pericholecystic fluid visualized: no     Common hepatic duct: 0.21 cm Proximal common bile duct: 0.24 cm Distal common bile duct: non vis cm     Right kidney: 12.12 cm       ______________________________________________________________________  Discharge Instructions:   Activity Instructions     Activity as tolerated            Diet Instructions     Discharge diet (specify)      Discharge Nutrition Therapy: Regular              Follow Up instructions and Outpatient Referrals     Ambulatory referral to Home Health      Is this a [facility] or [facility] Patient?: Yes    Home Health Options: Traditional Home Health    Is this patient at high risk for COVID 19 transmission and recommended to stay at home during this pandemic?: No    If the patient has a diagnosis of heart failure and is already on oral diuretics, do you want to activate the in home IV Lasix protocol?: No    Physician to follow patient's care: PCP    Disciplines requested:  Nursing  Physical Therapy  Occupational Therapy       Nursing requested: Other: (please enter in comments) Comment - Medication Admin    Physical Therapy requested: Evaluate and treat    Occupational Therapy Requested: Evaluate and treat    Requested start of care date: Routine (within 48 hours)    I certify that Nicholas Rush is confined to his/her home and needs intermittent skilled nursing care, physical therapy and/or speech therapy or continues to need occupational therapy. The patient is under my care, and I have authorized services on this plan of care and will periodically review the plan. The patient had a face-to-face encounter with an allowed provider type on 11/03/19 and the encounter was related to the primary reason for home health care.    Order to schedule follow-up visit with primary care physician            Appointments which have been scheduled for you    Dec 29, 2019  2:00 PM  (Arrive by 1:45 PM)  NEW GENERAL PCP with Scherrie Gerlach, MD  [facility] [place] [facility]) [address]  [place] [address]  [phone]  ______________________________________________________________________  Discharge Day Services:  BP 135/76  - Pulse 95  - Temp 36.4 ??C (97.5 ??F) (Oral)  - Resp 16  - SpO2 100%   Pt seen on the day of discharge and determined appropriate for discharge.  GEN: No acute distress  HEENT: Mucous membranes moist  CV: Regular rate and rhythm, no murmurs, rubs, or gallops  PULM: Clear to auscultation bilaterally  ABD: Soft, non-tender, non-distended, positive bowel sounds, no rebound or guarding  EXT: No lower extremity edema  NEURO: Alert, answering most questions appropriately    Condition at Discharge: good    Length of Discharge: I spent greater than 30 mins in the discharge of this patient.

## 2019-11-03 NOTE — Unmapped (Signed)
Pt A&Ox4 with episodes of slight confusion and anxiety. VSS, tolerates diet and meds well. No BM, no c/o pain. Walks with walker and standby assist. Pt repeatedly states that he wants to leave the hospital and is tired of being here. After pt talked to SO via phone call, pt seemed more relaxed and was able to rest for remainder of shift. Will con't to monitor pt status.     Problem: Adult Inpatient Plan of Care  Goal: Plan of Care Review  Outcome: Progressing  Goal: Patient-Specific Goal (Individualized)  Outcome: Progressing  Goal: Absence of Hospital-Acquired Illness or Injury  Outcome: Progressing  Intervention: Prevent and Manage VTE (Venous Thromboembolism) Risk  Recent Flowsheet Documentation  Taken 11/02/2019 2150 by [name], RN  Activity Management: activity adjusted per tolerance  Goal: Optimal Comfort and Wellbeing  Outcome: Progressing  Goal: Readiness for Transition of Care  Outcome: Progressing  Goal: Rounds/Family Conference  Outcome: Progressing     Problem: Fall Injury Risk  Goal: Absence of Fall and Fall-Related Injury  Outcome: Progressing     Problem: Self-Care Deficit  Goal: Improved Ability to Complete Activities of Daily Living  Outcome: Progressing     Problem: Hypertension Comorbidity  Goal: Blood Pressure in Desired Range  Outcome: Progressing     Problem: Seizure Disorder Comorbidity  Goal: Maintenance of Seizure Control  Outcome: Progressing     Problem: Skin Injury Risk Increased  Goal: Skin Health and Integrity  Outcome: Progressing     Problem: Infection  Goal: Absence of Infection Signs and Symptoms  Outcome: Progressing

## 2019-11-03 NOTE — Unmapped (Signed)
Patient still has had a lot of issues with remembering not to get up on his own and keeps trying to walk. Patient could possibly go home tomorrow with his girlfriend. No improvements with cognitive function. Patient is very impulsive. NAD noted, VSS, WCTM.

## 2019-11-03 NOTE — Unmapped (Signed)
Patient is currently resting in bed, VSS, significant other at bedside. Patient eloped off unit during beginning of this shift, see previous note. Patient is still confused to situation. Plan of care has been reviewed with patient. Patient agrees with plan of care. Patient is preparing for DC. All safety measures are in place. Will continue to monitor.     Problem: Adult Inpatient Plan of Care  Goal: Plan of Care Review  Outcome: Resolved  Goal: Patient-Specific Goal (Individualized)  Outcome: Resolved  Goal: Absence of Hospital-Acquired Illness or Injury  Outcome: Resolved  Intervention: Identify and Manage Fall Risk  Recent Flowsheet Documentation  Taken 11/03/2019 0800 by Helane Rima, RN  Safety Interventions:   chair alarm   environmental modification   fall reduction program maintained   lighting adjusted for tasks/safety   low bed   nonskid shoes/slippers when out of bed   room near unit station   seizure precautions  Intervention: Prevent and Manage VTE (Venous Thromboembolism) Risk  Recent Flowsheet Documentation  Taken 11/03/2019 0800 by Helane Rima, RN  Activity Management: activity adjusted per tolerance  Goal: Optimal Comfort and Wellbeing  Outcome: Resolved  Goal: Readiness for Transition of Care  Outcome: Resolved  Goal: Rounds/Family Conference  Outcome: Resolved     Problem: Fall Injury Risk  Goal: Absence of Fall and Fall-Related Injury  Outcome: Resolved  Intervention: Promote Injury-Free Environment  Recent Flowsheet Documentation  Taken 11/03/2019 0800 by Helane Rima, RN  Safety Interventions:   chair alarm   environmental modification   fall reduction program maintained   lighting adjusted for tasks/safety   low bed   nonskid shoes/slippers when out of bed   room near unit station   seizure precautions     Problem: Self-Care Deficit  Goal: Improved Ability to Complete Activities of Daily Living  Outcome: Resolved     Problem: Hypertension Comorbidity  Goal: Blood Pressure in Desired Range  Outcome: Resolved     Problem: Seizure Disorder Comorbidity  Goal: Maintenance of Seizure Control  Outcome: Resolved  Intervention: Maintain Seizure-Symptom Control  Recent Flowsheet Documentation  Taken 11/03/2019 0800 by Helane Rima, RN  Seizure Precautions:   side rails padded   activity supervised     Problem: Skin Injury Risk Increased  Goal: Skin Health and Integrity  Outcome: Resolved  Intervention: Optimize Skin Protection  Recent Flowsheet Documentation  Taken 11/03/2019 1200 by Helane Rima, RN  Pressure Reduction Techniques: frequent weight shift encouraged  Taken 11/03/2019 1000 by Helane Rima, RN  Pressure Reduction Techniques: frequent weight shift encouraged  Taken 11/03/2019 0800 by Helane Rima, RN  Pressure Reduction Techniques: frequent weight shift encouraged     Problem: Infection  Goal: Absence of Infection Signs and Symptoms  Outcome: Resolved

## 2019-11-06 NOTE — Unmapped (Signed)
Reason for Disposition  ??? Caller requesting an appointment, triage offered and declined     Requesting guidance prior to follow-up appointment.    Answer Assessment - Initial Assessment Questions  1. REASON FOR CALL or QUESTION: What is your reason for calling today? or How can I best  help you? or What question do you have that I can help answer?      Pt was discharged from hospital 9/30 - 10/15 and wife reviewing discharge instructions.  Concern for recommendation for neuropsych eval referral.  Also concern for possible medication to assist with withdrawal symptoms and cravings prior to having follow-up appt with PCP on Tuesday.  Advised to call the office during office hours to discuss with PCP or physician covering Dr. Verdene Lennert patients while he is on vacation.  Wife verbalized understanding  2. CALLER: Document the source of call. (e.g., laboratory, patient).      Pt wife.    Protocols used: PCP CALL - NO TRIAGE-A-AH

## 2019-11-06 NOTE — Unmapped (Signed)
Hi Renalda,  He will need an appointment for this.    Thanks,  [employer]

## 2019-11-06 NOTE — Unmapped (Signed)
Coming to see Dr. Freda Jackson on Thursday

## 2019-11-06 NOTE — Unmapped (Signed)
Patient's wife left message requesting a return call. She stated that they he was just released from the hospital for alcohol withdrawal.  She stated that he is slightly cognitively impaired and is already craving alcohol. They have cambria and naltrexone but both are expired. She wants to know if she should administer either one or if he could get a new prescription. He is already scheduled for an ov with [name] on 11/14/19. Please advise.

## 2019-11-09 ENCOUNTER — Ambulatory Visit: Admit: 2019-11-09 | Discharge: 2019-11-10 | Payer: BLUE CROSS/BLUE SHIELD

## 2019-11-09 DIAGNOSIS — M545 Chronic midline low back pain without sciatica: Principal | ICD-10-CM

## 2019-11-09 DIAGNOSIS — F5101 Primary insomnia: Principal | ICD-10-CM

## 2019-11-09 DIAGNOSIS — F1020 Alcohol dependence, uncomplicated: Principal | ICD-10-CM

## 2019-11-09 DIAGNOSIS — L409 Psoriasis, unspecified: Principal | ICD-10-CM

## 2019-11-09 DIAGNOSIS — G8929 Other chronic pain: Secondary | ICD-10-CM

## 2019-11-09 MED ORDER — THIAMINE HCL (VITAMIN B1) 100 MG TABLET
ORAL_TABLET | Freq: Every day | ORAL | 3 refills | 100 days | Status: CP
Start: 2019-11-09 — End: 2020-11-08

## 2019-11-09 MED ORDER — NALTREXONE 50 MG TABLET
ORAL_TABLET | Freq: Every day | ORAL | 11 refills | 30 days | Status: CP
Start: 2019-11-09 — End: 2020-11-08

## 2019-11-09 MED ORDER — CITALOPRAM 10 MG TABLET
ORAL_TABLET | Freq: Every day | ORAL | 3 refills | 90 days | Status: CP
Start: 2019-11-09 — End: ?

## 2019-11-09 MED ORDER — PYRIDOXINE (VITAMIN B6) 100 MG TABLET
ORAL_TABLET | Freq: Every day | ORAL | 11 refills | 30 days | Status: CP
Start: 2019-11-09 — End: ?

## 2019-11-09 MED ORDER — FOLIC ACID 1 MG TABLET
ORAL_TABLET | Freq: Every day | ORAL | 11 refills | 30 days | Status: CP
Start: 2019-11-09 — End: ?

## 2019-11-09 MED ORDER — DICLOFENAC SODIUM 75 MG TABLET,DELAYED RELEASE
ORAL_TABLET | Freq: Two times a day (BID) | ORAL | 11 refills | 30 days | Status: CP | PRN
Start: 2019-11-09 — End: 2020-11-08

## 2019-11-09 MED ORDER — MAGNESIUM OXIDE 400 MG (241.3 MG MAGNESIUM) TABLET
ORAL_TABLET | Freq: Every day | ORAL | 2 refills | 90.00000 days | Status: CP
Start: 2019-11-09 — End: ?

## 2019-11-09 MED ORDER — ADALIMUMAB SYRINGE CITRATE FREE 40 MG/0.4 ML
1 refills | 0 days | Status: CP
Start: 2019-11-09 — End: ?

## 2019-11-09 NOTE — Unmapped (Signed)
[place] ASAP program [phone]

## 2019-11-09 NOTE — Unmapped (Signed)
Assessment and Plan:  1. Alcoholism (CMS-HCC)  Craving but avoiding alcohol, has good support from his partner  Start naltrexone  Fu 1 month  Gave number to [facility] ASAP program  - naltrexone (DEPADE) 50 mg tablet; Take 1 tablet (50 mg total) by mouth daily.  Dispense: 30 tablet; Refill: 11    2. Psoriasis  Refill needed  - ADALIMUMAB SYRINGE CITRATE FREE 40 MG/0.4 ML; Inject the contents of 1 syringe (40 mg) under the skin every 14 days  Dispense: 4 each; Refill: 1    3. Primary insomnia  Avoiding all addictive meds  Benadryl ok to try if it doesn't cause confusion    4. Chronic midline low back pain without sciatica  Trial of voltaren, all muscle relaxants and controlled pain meds should be avoided  - diclofenac (VOLTAREN) 75 MG EC tablet; Take 1 tablet (75 mg total) by mouth two (2) times a day as needed.  Dispense: 60 tablet; Refill: 11    5. Cognitive issues--a referral has been made for neuropsych eval. Most likely related to heavy alcohol use        Cc:   Chief Complaint     Hospitalization Follow-up           HPI  Nicholas Rush is a 54 y.o. male who presents today for hospital fu after being admitted from 9/30-10/15 for alcohol withdrawal causing DTs. Course is as below:    Patient presented to the ED following seizure and had 2 additional witnessed seizures in the ED. ??Last drink was approximately 12 hours prior to presentation on the morning of 9/30. ??He was monitored with CIWA protocol; ultimately CIWA scores were all low and PRN Ativan was stopped due to concern it was causing worsened deliruim. ??No further seizures while admitted. He was treated with a Librium taper, last dose 10/4 as well as briefly with Depakote 500 mg twice daily; last dose 10/4. He received thiamine and folate, including high dose IV thiamine for three days. Social work was consulted for substance abuse resources. See below re: protracted hospital delirium. He ultimately discharged to home with home health services. ??  Delirium  Continued to have waxing and waning confusion and intermittent agitation felt most consistent with protracted delirium.??Considered multiple potential??contributing??causes but evaluation was unrevealing including head CT with no acute process, no evidence of hepatic encephalopathy, no evidence of acute infection including COVID or on CXR or UA, normal B12, TSH, syphilis screen, HIV. Trialed low dose benzo for ongoing withdrawal and this worsened symptoms. OT performed living skills assessment which??did suggest??cognitive impairment. He slowly improved to near his baseline, and suspect he may have some degree of ongoing cognitive impairment due to alcoholism, with global atrophy noted on head CT. He was started on scheduled olanzapine which improved his delirium. At discharge, he was able to ambulate, and his significant other Nicholas Rush agreed to take him home with 24/7 supervision provided. Home health nursing, PT/OT was arranged.  ??  Hyponatremia, stable  Sodium 125 on admission, improved with IV fluids, and remained stable low 130s. 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 Cortisol normal, TSH low but FT4 WNL. Was placed on 1000 mL fluid restriction. Citalopram was briefly held but resumed with no change to his sodium.     His partner Nicholas Rush is with him 24/7. He has not had anything to drink since his hospitalization. No seizures. Has been on and off confused. +craving alcohol. Wants something for back pain. Wants something for  sleep.       Past Medical History:   Diagnosis Date   ??? Anxiety    ??? Depressive disorder    ??? Erectile dysfunction    ??? Esophageal reflux    ??? History of pyloric stenosis    ??? Hypertension    ??? Insomnia    ??? Psoriasis     Dermatologist - Dr. Deloria Lair in [facility]   ??? Seizure (CMS-HCC) 02/20/2019   ??? Tobacco use     1ppd     Allergies   Allergen Reactions   ??? Hydroxyzine Itching     Sedation.  Ineffective for reducing anxiety.       Current Outpatient Medications on File Prior to Visit   Medication Sig Dispense Refill   ??? ADALIMUMAB SYRINGE CITRATE FREE 40 MG/0.4 ML Inject the contents of 1 syringe (40 mg) under the skin every 14 days 4 each 1   ??? calcium carbonate/vitamin D3 (CALCIUM 500 + D ORAL) Take 1 tablet by mouth daily.     ??? cetirizine (ZYRTEC) 10 MG tablet Take 10 mg by mouth daily.     ??? citalopram (CELEXA) 10 MG tablet Take 10 mg by mouth daily.     ??? empty container Misc Use as directed to dispose of Humira syringes. 1 each 2   ??? fluticasone propionate (FLONASE) 50 mcg/actuation nasal spray 2 sprays into each nostril daily. 16 g 12   ??? folic acid (FOLVITE) 1 MG tablet Take 1 mg by mouth daily.     ??? halobetasol (ULTRAVATE) 0.05 % ointment Apply topically Two (2) times a day. To affected areas as needed. 60 g 0   ??? ibuprofen (ADVIL,MOTRIN) 200 MG tablet Take 200 mg by mouth every six (6) hours as needed. takes 2 to 4 tabs as needed for pain     ??? lisinopriL (PRINIVIL,ZESTRIL) 20 MG tablet Take 1 tablet (20 mg total) by mouth daily. 30 tablet 10   ??? magnesium oxide (MAG-OX) 400 mg (241.3 mg magnesium) tablet Take 1 tablet (400 mg total) by mouth daily. 120 tablet 0   ??? multivitamin,tx-iron-minerals (THERA-M) Tab Take 1 tablet by mouth daily.     ??? nicotine (NICODERM CQ) 21 mg/24 hr patch Place 1 patch on the skin daily. Remove old patch before applying new one. 28 patch 0   ??? omeprazole (PRILOSEC) 20 MG capsule TAKE 1 CAPSULE(20MG  TOTAL) BY MOUTH EVERY DAY 90 capsule 1   ??? pyridoxine, vitamin B6, (VITAMIN B-6) 100 MG tablet Take 100 mg by mouth daily.      ??? sildenafiL, pulm.hypertension, (REVATIO) 20 mg tablet 2-5 tabs prior to intercourse as needed 30 tablet 5   ??? thiamine (B-1) 100 MG tablet Take 1 tablet (100 mg total) by mouth daily. 100 tablet 3     No current facility-administered medications on file prior to visit.       Family History   Problem Relation Age of Onset   ??? Bipolar disorder Mother    ??? Depression Sister    ??? Alcohol abuse Sister    ??? Alcohol abuse Maternal Aunt    ??? Melanoma Neg Hx    ??? Basal cell carcinoma Neg Hx    ??? Squamous cell carcinoma Neg Hx      Social History     Socioeconomic History   ??? Marital status: Media planner     Spouse name: Not on file   ??? Number of children: Not on file   ??? Years of education:  Not on file   ??? Highest education level: Not on file   Occupational History   ??? Not on file   Tobacco Use   ??? Smoking status: Current Every Day Smoker     Packs/day: 1.00     Years: 30.00     Pack years: 30.00     Types: Cigarettes   ??? Smokeless tobacco: Never Used   Substance and Sexual Activity   ??? Alcohol use: Yes     Alcohol/week: 42.0 standard drinks     Types: 42 Cans of beer per week     Comment: 6 pack every day   ??? Drug use: No   ??? Sexual activity: Not on file   Other Topics Concern   ??? Do you use sunscreen? Yes   ??? Tanning bed use? Yes   ??? Are you easily burned? Yes   ??? Excessive sun exposure? No   ??? Blistering sunburns? Yes   Social History Narrative    Lives in [place] with wife and two children.  Works [employer].  Current smoker.  Alcohol intake is excessive.  On some days it's 12 beers.                 Social Determinants of Health     Financial Resource Strain: Low Risk    ??? Difficulty of Paying Living Expenses: Not hard at all   Food Insecurity: No Food Insecurity   ??? Worried About Programme researcher, broadcasting/film/video in the Last Year: Never true   ??? Ran Out of Food in the Last Year: Never true   Transportation Needs: No Transportation Needs   ??? Lack of Transportation (Medical): No   ??? Lack of Transportation (Non-Medical): No   Physical Activity:    ??? Days of Exercise per Week:    ??? Minutes of Exercise per Session:    Stress:    ??? Feeling of Stress :    Social Connections:    ??? Frequency of Communication with Friends and Family:    ??? Frequency of Social Gatherings with Friends and Family:    ??? Attends Religious Services:    ??? Database administrator or Organizations:    ??? Attends Engineer, structural:    ??? Marital Status:           Review of Systems - Negative except noted in hpi    Physical Exam:  BP 130/80 (BP Site: L Arm, BP Position: Sitting, BP Cuff Size: Medium)  - Pulse 76  - SpO2 96%     General appearance: alert, cooperative and forgetful and repeats self  Lungs: clear to auscultation bilaterally  Heart: regular rate and rhythm, S1, S2 normal, no murmur, click, rub or gallop  Abdomen: soft, non-tender; bowel sounds normal; no masses,  no organomegaly  Extremities: extremities normal, atraumatic, no cyanosis or edema  Pulses: 2+ and symmetric.

## 2019-11-13 DIAGNOSIS — M545 Chronic midline low back pain without sciatica: Principal | ICD-10-CM

## 2019-11-13 DIAGNOSIS — L409 Psoriasis, unspecified: Principal | ICD-10-CM

## 2019-11-13 DIAGNOSIS — G8929 Other chronic pain: Principal | ICD-10-CM

## 2019-11-13 MED ORDER — ADALIMUMAB SYRINGE CITRATE FREE 40 MG/0.4 ML
1 refills | 0 days | Status: CP
Start: 2019-11-13 — End: ?
  Filled 2019-11-28: qty 2, 28d supply, fill #0

## 2019-11-13 MED ORDER — DICLOFENAC SODIUM 75 MG TABLET,DELAYED RELEASE
ORAL_TABLET | Freq: Two times a day (BID) | ORAL | 11 refills | 30 days | Status: CP | PRN
Start: 2019-11-13 — End: 2020-11-12
  Filled 2019-11-28: qty 60, 30d supply, fill #0

## 2019-11-13 MED ORDER — EMPTY CONTAINER
2 refills | 0 days
Start: 2019-11-13 — End: ?

## 2019-11-13 NOTE — Unmapped (Addendum)
[facility] Refill Coordination Note    Specialty Medication(s) to be Shipped:   Inflammatory Disorders: Humira    Other medication(s) to be shipped: Sharps container, Diclofenac 75 mg tablets and Lisinopril 20 mg     Nicholas Rush, DOB: [birth year]  Phone: [phone] (home)       All above HIPAA information was verified with patient.     Was a Nurse, learning disability used for this call? No    Completed refill call assessment today to schedule patient's medication shipment from the [facility] [phone]).       Specialty medication(s) and dose(s) confirmed: Regimen is correct and unchanged.   Changes to medications: Nicholas Rush reports no changes at this time.  Changes to insurance: No  Questions for the pharmacist: No    Confirmed patient received Welcome Packet with first shipment. The patient will receive a drug information handout for each medication shipped and additional FDA Medication Guides as required.       DISEASE/MEDICATION-SPECIFIC INFORMATION        For patients on injectable medications: Patient currently has 0 doses left.  Next injection is scheduled for next week.    SPECIALTY MEDICATION ADHERENCE     Medication Adherence    Patient reported X missed doses in the last month: 0  Specialty Medication: Humira CF 40 mg/0.4 ml  Patient is on additional specialty medications: No  Any gaps in refill history greater than 2 weeks in the last 3 months: no  Demonstrates understanding of importance of adherence: yes  Informant: patient  Reliability of informant: reliable  Confirmed plan for next specialty medication refill: delivery by pharmacy  Refills needed for supportive medications: yes, ordered or provider notified                  SHIPPING     Shipping address confirmed in Epic.     Delivery Scheduled: Yes, Expected medication delivery date: 11/15/2019.  However, Rx request for refills was sent to the provider as there are none remaining.     Medication will be delivered via Same Day Courier to the prescription address in Epic WAM.    Nicholas Rush   [facility] Specialty Technician

## 2019-11-13 NOTE — Unmapped (Signed)
Patient is requesting the following refill  Requested Prescriptions     Pending Prescriptions Disp Refills   ??? ADALIMUMAB SYRINGE CITRATE FREE 40 MG/0.4 ML 4 each 1     Sig: Inject the contents of 1 syringe (40 mg) under the skin every 14 days   ??? diclofenac (VOLTAREN) 75 MG EC tablet 60 tablet 11     Sig: Take 1 tablet (75 mg total) by mouth two (2) times a day as needed.       Last OV: 11/09/2019    Next OV: Visit date not found.     Labs: Not applicable this refill

## 2019-11-15 DIAGNOSIS — M545 Chronic midline low back pain without sciatica: Principal | ICD-10-CM

## 2019-11-15 DIAGNOSIS — G8929 Other chronic pain: Principal | ICD-10-CM

## 2019-11-15 NOTE — Unmapped (Signed)
Nicholas Rush 's Humira shipment will be delayed as a result of the prescription being sent to the incorrect pharmacy and now have issues with Medicaid reversal.     I have reached out to the patient and was unable to leave a message. We will call the patient back to reschedule the delivery upon resolution. We have not confirmed the new delivery date.

## 2019-11-16 NOTE — Unmapped (Signed)
Held for 15 minutes after sending [employer] video visit link without response. Will need to reschedule.

## 2019-11-27 NOTE — Unmapped (Signed)
Patient called requesting a return call. He would like labs to be ordered for today and he would also like a Doctor's note stating that he is fit to drive and 2 handicap placards.

## 2019-11-28 MED FILL — EMPTY CONTAINER: 120 days supply | Qty: 1 | Fill #0

## 2019-11-28 MED FILL — HUMIRA SYRINGE CITRATE FREE 40 MG/0.4 ML: 28 days supply | Qty: 2 | Fill #0 | Status: AC

## 2019-11-28 MED FILL — EMPTY CONTAINER: 120 days supply | Qty: 1 | Fill #0 | Status: AC

## 2019-11-28 MED FILL — LISINOPRIL 20 MG TABLET: 30 days supply | Qty: 30 | Fill #0 | Status: AC

## 2019-11-28 MED FILL — DICLOFENAC SODIUM 75 MG TABLET,DELAYED RELEASE: 30 days supply | Qty: 60 | Fill #0 | Status: AC

## 2019-11-28 NOTE — Unmapped (Signed)
Nicholas Rush 's Humira and entire delivery shipment will be sent out  as a result of prior authorization now approved. and Medicaid reversal was fixed with [employer].     I have reached out to the patient and communicated the delivery change. We will reschedule the medication for the delivery date that the patient agreed upon.  We have confirmed the delivery date as 11/28/19 via [facility] Courier.

## 2019-11-29 ENCOUNTER — Ambulatory Visit
Admit: 2019-11-29 | Discharge: 2019-12-07 | Disposition: A | Discharge status: Home Health | Payer: MEDICAID | Admitting: Internal Medicine

## 2019-11-29 DIAGNOSIS — I10 Essential (primary) hypertension: Principal | ICD-10-CM

## 2019-11-29 DIAGNOSIS — F1010 Alcohol abuse, uncomplicated: Principal | ICD-10-CM

## 2019-11-29 DIAGNOSIS — G629 Polyneuropathy, unspecified: Principal | ICD-10-CM

## 2019-11-29 DIAGNOSIS — E871 Hypo-osmolality and hyponatremia: Principal | ICD-10-CM

## 2019-11-29 DIAGNOSIS — F17210 Nicotine dependence, cigarettes, uncomplicated: Principal | ICD-10-CM

## 2019-11-29 DIAGNOSIS — L4050 Arthropathic psoriasis, unspecified: Principal | ICD-10-CM

## 2019-11-29 DIAGNOSIS — R270 Ataxia, unspecified: Principal | ICD-10-CM

## 2019-11-29 DIAGNOSIS — F319 Bipolar disorder, unspecified: Principal | ICD-10-CM

## 2019-11-29 DIAGNOSIS — Z20822 Contact with and (suspected) exposure to covid-19: Principal | ICD-10-CM

## 2019-11-29 DIAGNOSIS — M545 Low back pain, unspecified: Principal | ICD-10-CM

## 2019-11-29 DIAGNOSIS — K219 Gastro-esophageal reflux disease without esophagitis: Principal | ICD-10-CM

## 2019-11-29 DIAGNOSIS — F419 Anxiety disorder, unspecified: Principal | ICD-10-CM

## 2019-11-29 DIAGNOSIS — R4182 Altered mental status, unspecified: Principal | ICD-10-CM

## 2019-11-29 DIAGNOSIS — D539 Nutritional anemia, unspecified: Principal | ICD-10-CM

## 2019-11-29 LAB — TOXICOLOGY SCREEN, URINE
AMPHETAMINE SCREEN URINE: NEGATIVE
BARBITURATE SCREEN URINE: NEGATIVE
BENZODIAZEPINE SCREEN, URINE: NEGATIVE
BUPRENORPHINE, URINE SCREEN: NEGATIVE
CANNABINOID SCREEN URINE: NEGATIVE
COCAINE(METAB.)SCREEN, URINE: NEGATIVE
FENTANYL SCREEN, URINE: NEGATIVE
METHADONE SCREEN, URINE: NEGATIVE
OPIATE SCREEN URINE: NEGATIVE
OXYCODONE SCREEN URINE: NEGATIVE

## 2019-11-29 LAB — URINALYSIS
BACTERIA: NONE SEEN /HPF
BILIRUBIN UA: NEGATIVE
BLOOD UA: NEGATIVE
GLUCOSE UA: NEGATIVE
KETONES UA: NEGATIVE
LEUKOCYTE ESTERASE UA: NEGATIVE
NITRITE UA: NEGATIVE
PH UA: 7 (ref 5.0–9.0)
PROTEIN UA: NEGATIVE
RBC UA: 1 /HPF (ref <=3)
SPECIFIC GRAVITY UA: 1.005 (ref 1.003–1.030)
SQUAMOUS EPITHELIAL: <1 /HPF (ref 0–5)
UROBILINOGEN UA: 0.2
WBC UA: 1 /HPF (ref <=2)

## 2019-11-29 LAB — COMPREHENSIVE METABOLIC PANEL
ALBUMIN: 3.6 g/dL (ref 3.4–5.0)
ALKALINE PHOSPHATASE: 74 U/L (ref 46–116)
ALT (SGPT): 16 U/L (ref 10–49)
ANION GAP: 7 mmol/L (ref 5–14)
AST (SGOT): 26 U/L (ref <=34)
BILIRUBIN TOTAL: 0.4 mg/dL (ref 0.3–1.2)
BLOOD UREA NITROGEN: 14 mg/dL (ref 9–23)
BUN / CREAT RATIO: 15
CALCIUM: 9.4 mg/dL (ref 8.7–10.4)
CHLORIDE: 96 mmol/L — ABNORMAL LOW (ref 98–107)
CO2: 24 mmol/L (ref 20.0–31.0)
CREATININE: 0.95 mg/dL
EGFR CKD-EPI AA MALE: >90 mL/min/{1.73_m2} (ref >=60)
EGFR CKD-EPI NON-AA MALE: 90 mL/min/{1.73_m2} (ref >=60)
GLUCOSE RANDOM: 103 mg/dL (ref 70–179)
POTASSIUM: 3.9 mmol/L (ref 3.4–4.5)
PROTEIN TOTAL: 6.9 g/dL (ref 5.7–8.2)
SODIUM: 127 mmol/L — ABNORMAL LOW (ref 135–145)

## 2019-11-29 LAB — CBC W/ AUTO DIFF
BASOPHILS ABSOLUTE COUNT: 0 10*9/L (ref 0.0–0.1)
BASOPHILS RELATIVE PERCENT: 0.7 %
EOSINOPHILS ABSOLUTE COUNT: 0.3 10*9/L (ref 0.0–0.4)
EOSINOPHILS RELATIVE PERCENT: 4.8 %
HEMATOCRIT: 37 % — ABNORMAL LOW (ref 41.0–53.0)
HEMOGLOBIN: 12.9 g/dL — ABNORMAL LOW (ref 13.5–17.5)
LARGE UNSTAINED CELLS: 2 % (ref 0–4)
LYMPHOCYTES ABSOLUTE COUNT: 2 10*9/L (ref 1.5–5.0)
LYMPHOCYTES RELATIVE PERCENT: 30.8 %
MEAN CORPUSCULAR HEMOGLOBIN CONC: 34.8 g/dL (ref 31.0–37.0)
MEAN CORPUSCULAR HEMOGLOBIN: 36 pg — ABNORMAL HIGH (ref 26.0–34.0)
MEAN CORPUSCULAR VOLUME: 103.7 fL — ABNORMAL HIGH (ref 80.0–100.0)
MEAN PLATELET VOLUME: 8.3 fL (ref 7.0–10.0)
MONOCYTES ABSOLUTE COUNT: 0.4 10*9/L (ref 0.2–0.8)
MONOCYTES RELATIVE PERCENT: 6.6 %
NEUTROPHILS ABSOLUTE COUNT: 3.6 10*9/L (ref 2.0–7.5)
NEUTROPHILS RELATIVE PERCENT: 55.4 %
PLATELET COUNT: 329 10*9/L (ref 150–440)
RED BLOOD CELL COUNT: 3.57 10*12/L — ABNORMAL LOW (ref 4.50–5.90)
RED CELL DISTRIBUTION WIDTH: 12.5 % (ref 12.0–15.0)
WBC ADJUSTED: 6.6 10*9/L (ref 4.5–11.0)

## 2019-11-29 LAB — AMMONIA: AMMONIA: 28 umol/L (ref 11–32)

## 2019-11-29 LAB — SALICYLATE LEVEL: SALICYLATE LEVEL: <3 mg/dL (ref 0.0–30.0)

## 2019-11-29 LAB — ETHANOL: ETHANOL: <10 mg/dL (ref <=10.0)

## 2019-11-29 LAB — TSH: THYROID STIMULATING HORMONE: 0.513 u[IU]/mL — ABNORMAL LOW (ref 0.550–4.780)

## 2019-11-29 LAB — HIGH SENSITIVITY TROPONIN I - SINGLE: HIGH SENSITIVITY TROPONIN I: 5 ng/L (ref <=53)

## 2019-11-29 LAB — LIPASE: LIPASE: 26 U/L (ref 12–53)

## 2019-11-29 LAB — T4, FREE: FREE T4: 1.35 ng/dL (ref 0.89–1.76)

## 2019-11-29 LAB — MAGNESIUM: MAGNESIUM: 1.3 mg/dL — ABNORMAL LOW (ref 1.6–2.6)

## 2019-11-29 LAB — ACETAMINOPHEN LEVEL: ACETAMINOPHEN LEVEL: <2 ug/mL (ref <=20.0)

## 2019-11-29 MED ADMIN — sodium chloride 0.9% (NS) bolus 1,000 mL: 1000 mL | INTRAVENOUS | @ 23:00:00 | Stop: 2019-11-29

## 2019-11-29 MED ADMIN — magnesium sulfate in water 2 gram/50 mL (4 %) IVPB 2 g: 2 g | INTRAVENOUS | @ 23:00:00 | Stop: 2019-11-29

## 2019-11-29 MED ADMIN — folic acid (FOLVITE) tablet 1 mg: 1 mg | ORAL | @ 23:00:00 | Stop: 2019-11-29

## 2019-11-29 MED ADMIN — thiamine (B-1) tablet 500 mg: 500 mg | ORAL | @ 23:00:00 | Stop: 2019-11-29

## 2019-11-29 NOTE — Unmapped (Signed)
Dr. Grover at bedside

## 2019-11-29 NOTE — Unmapped (Signed)
Pt doing sit ups in the room while wife is in the hallway outside the room talking to Dr. Williemae Area, stopped once wife returned to the room

## 2019-11-29 NOTE — Unmapped (Signed)
[facility]  Emergency Department Provider Note        ED Clinical Impression      Final diagnoses:   Altered mental status, unspecified altered mental status type (Primary)           Impression, ED Course, Assessment and Plan      Impression: Nicholas Rush is a 54 y.o. male with PMHx of anxiety, depression (on celexa) , HTN, seizures, and alcohol abuse with complicated withdrawals presenting for a few weeks of increasingly erratic behavior and irrational decision-making, in the setting of ~1.5 months of no alcohol use as described below.    On exam, the patient is well-appearing and in no acute distress. VSS, afebrile, normotensive, sating 100% on RA. Heart rate and rhythm regular. Lungs clear to auscultation bilaterally. Abdomen soft and non tender. No guarding or rebound. Neuro exam is normal.  Cranial nerves intact.  5/5 strength in upper and lower extremities.  Sensation intact.  No cerebellar dysfunction.    Differential includes Wernicke/Korsakoff encephalopathy versus alcohol associated dementia.  Patient does have nutritional deficiency with mental status change, suggesting Wernicke's is a high possibility.  Wife adamantly denies any recent alcohol use, making withdrawal less likely. Infectious etiology such as meningitis or encephalitis also possible but less likely given absence of other symptoms such as fever, headache, nausea, vomiting. Thyrotoxicosis also on the differential.     Plan for basic labs,ammonia levels, Covid-PCR, hsTroponin, magnesium, TSH, lipase, salicylate, acetaminophen, EtOH, tox screen, and UA. Will give thiamine 500 mg  and folic acid 1 g.       ED Course:      2100: Labs significant for Mag 1.6, Na 131. TSH 0.513 however free T3 and T4 wnl. EtOH, acetominophen, salicylate level negative. CT Head negative for intracranial hemorrhage.     Will replete with 1 g Mag and 1 L bolus. Will admit to medicine given altered mental status of somewhat unclear etiology (possibly Wernicke's, alcohol-associated dementia).        Additional Medical Decision Making     I have reviewed the vital signs and the nursing notes. Labs and radiology results that were available during my care of the patient were independently reviewed by me and considered in my medical decision making. I discussed the case with Dr. Noralee Stain, ED Attending.      History        Chief Complaint  Psych Problem      HPI   Nicholas Rush is a 54 y.o. male with a PMH of anxiety, depression (on celexa) , HTN, seizures, and alcohol abuse with complicated withdrawals presenting to the ED for evaluation of a psych problem. Per chart review, the patient was hospitalized from 9/30-10/15 for seizures 2/2 alcohol withdrawal. He has also been hospitalized in the past for psychiatric evaluation and for previous episodes withdrawal seizures. The patient's wife was advised by his PCP to bring him in to the hospital today for psychiatric evaluation due to increasingly erratic and manic behavior. She reports that since he was last discharged from the hospital on 10/15, he has been weaning off of his antipsychotic drugs, and he has been making more irrational decisions, like trying to buy a Radio producer, take out loans, and join [employer] on a whim. She reports that he alternates between very quiet behavior to nonstop talking sporadically. He has not been sleeping throughout the night and is only sleeping for 20 minutes as a time when he does. She  has tried giving him melatonin and Benadryl, which have not helped with his sleeping. Per wife, the patient is oriented to himself, but is unsure as to why he is being brought to the hospital today.  Wife is nervous that his behavior will lead him to eventually hurt himself. Wife reports that he has a history of years of alcohol abuse, drinking ~25 airplane bottles or 1 handle of liquor each day. He has a home health aide that checks up on him once per week.     The patient denies any acute complaints today- he denies any new pain. He denies recent alcohol use, with his last use being 9/28. He denies other drug use. He notes that he has been attending AA meetings via Zoom. When asked about his sleeping habits, he reports that he has been sleeping in 4 hour intervals, though wife continues to endorse that he only sleeps for 20 minutes at a time. He denies history of difficulty sleeping. He began having seizures related to withdrawals in November 2020, though previous doctors have speculated that he has been having seizures for a few years. Family history of bipolar disorder in mom. The patient denies SI or HI. Wife reports he last took thiamine this morning. Denies fevers, nausea, vomiting, diarrhea, chest pain, shortness of breath, double vision, abdominal pain or headaches.     Past Medical History:   Diagnosis Date   ??? Anxiety    ??? Depressive disorder    ??? Erectile dysfunction    ??? Esophageal reflux    ??? History of pyloric stenosis    ??? Hypertension    ??? Insomnia    ??? Psoriasis     Dermatologist - Dr. Deloria Lair in [facility]   ??? Seizure (CMS-HCC) 02/20/2019   ??? Tobacco use     1ppd       Patient Active Problem List   Diagnosis   ??? Depressive disorder, not elsewhere classified   ??? Alcohol withdrawal syndrome, with delirium (CMS-HCC)   ??? Alcoholism (CMS-HCC)   ??? Anxiety state   ??? Essential hypertension   ??? Insomnia   ??? Psoriasis   ??? Alcohol abuse, episodic   ??? Psoriatic arthritis (CMS-HCC)   ??? Positive colorectal cancer screening using DNA-based stool test   ??? Diarrhea of presumed infectious origin   ??? Alcohol withdrawal seizure (CMS-HCC)   ??? Recurrent falls   ??? Pancytopenia (CMS-HCC)   ??? Thrombocytopenia (CMS-HCC)   ??? Macrocytic anemia   ??? Hyponatremia   ??? Hypomagnesemia       Past Surgical History:   Procedure Laterality Date   ??? PYLOROMYOTOMY     ??? WISDOM TOOTH EXTRACTION           Current Facility-Administered Medications:   ???  folic acid (FOLVITE) tablet 1 mg, 1 mg, Oral, Daily, Clancy Gourd, MD, 1 mg at 11/29/19 1740  ???  magnesium sulfate in water 2 gram/50 mL (4 %) IVPB 2 g, 2 g, Intravenous, Once, Clancy Gourd, MD, Last Rate: 25 mL/hr at 11/29/19 1743, 2 g at 11/29/19 1743    Current Outpatient Medications:   ???  ADALIMUMAB SYRINGE CITRATE FREE 40 MG/0.4 ML, Inject the contents of 1 syringe (40 mg) under the skin every 14 days, Disp: 4 each, Rfl: 1  ???  calcium carbonate/vitamin D3 (CALCIUM 500 + D ORAL), Take 1 tablet by mouth daily., Disp: , Rfl:   ???  cetirizine (ZYRTEC) 10 MG tablet, Take 10 mg by mouth daily., Disp: , Rfl:   ???  citalopram (CELEXA) 10 MG tablet, Take 1 tablet (10 mg total) by mouth daily., Disp: 90 tablet, Rfl: 3  ???  diclofenac (VOLTAREN) 75 MG EC tablet, Take 1 tablet (75 mg total) by mouth two (2) times a day as needed., Disp: 60 tablet, Rfl: 11  ???  empty container Misc, Use as directed to dispose of Humira syringes., Disp: 1 each, Rfl: 2  ???  empty container Misc, Use as directed, Disp: 1 each, Rfl: 2  ???  fluticasone propionate (FLONASE) 50 mcg/actuation nasal spray, 2 sprays into each nostril daily., Disp: 16 g, Rfl: 12  ???  folic acid (FOLVITE) 1 MG tablet, Take 1 tablet (1 mg total) by mouth daily., Disp: 30 tablet, Rfl: 11  ???  halobetasol (ULTRAVATE) 0.05 % ointment, Apply topically Two (2) times a day. To affected areas as needed., Disp: 60 g, Rfl: 0  ???  lisinopriL (PRINIVIL,ZESTRIL) 20 MG tablet, Take 1 tablet (20 mg total) by mouth daily., Disp: 30 tablet, Rfl: 10  ???  magnesium oxide (MAG-OX) 400 mg (241.3 mg elemental magnesium) tablet, Take 1 tablet (400 mg total) by mouth daily., Disp: 90 tablet, Rfl: 2  ???  multivitamin,tx-iron-minerals (THERA-M) Tab, Take 1 tablet by mouth daily., Disp: , Rfl:   ???  naltrexone (DEPADE) 50 mg tablet, Take 1 tablet (50 mg total) by mouth daily., Disp: 30 tablet, Rfl: 11  ???  nicotine (NICODERM CQ) 21 mg/24 hr patch, Place 1 patch on the skin daily. Remove old patch before applying new one., Disp: 28 patch, Rfl: 0  ???  omeprazole (PRILOSEC) 20 MG capsule, TAKE 1 CAPSULE(20MG  TOTAL) BY MOUTH EVERY DAY, Disp: 90 capsule, Rfl: 1  ???  pyridoxine, vitamin B6, (VITAMIN B-6) 100 MG tablet, Take 1 tablet (100 mg total) by mouth daily., Disp: 30 tablet, Rfl: 11  ???  sildenafiL, pulm.hypertension, (REVATIO) 20 mg tablet, 2-5 tabs prior to intercourse as needed, Disp: 30 tablet, Rfl: 5  ???  thiamine (B-1) 100 MG tablet, Take 1 tablet (100 mg total) by mouth daily., Disp: 100 tablet, Rfl: 3    Allergies  Tramadol and Hydroxyzine    Family History   Problem Relation Age of Onset   ??? Bipolar disorder Mother    ??? Depression Sister    ??? Alcohol abuse Sister    ??? Alcohol abuse Maternal Aunt    ??? Melanoma Neg Hx    ??? Basal cell carcinoma Neg Hx    ??? Squamous cell carcinoma Neg Hx        Social History  Social History     Tobacco Use   ??? Smoking status: Current Every Day Smoker     Packs/day: 1.00     Years: 30.00     Pack years: 30.00     Types: Cigarettes   ??? Smokeless tobacco: Never Used   Substance Use Topics   ??? Alcohol use: Yes     Alcohol/week: 42.0 standard drinks     Types: 42 Cans of beer per week     Comment: 6 pack every day   ??? Drug use: No       Review of Systems  Constitutional: Negative for fevers, chills  Eyes: Negative for visual changes.  ENT: Negative for sore throat.  Cardiovascular: Negative for chest pain.  Respiratory: Negative for shortness of breath.  Gastrointestinal: Negative for nausea, vomiting, diarrhea  Genitourinary: Negative for dysuria.   Musculoskeletal: Negative for back pain.  Skin: Negative for rash.  Neurological: Negative for headaches,  focal weakness or numbness.  Psychiatric: Positive for manic behavior     Physical Exam     ED Triage Vitals [11/29/19 1245]   Enc Vitals Group      BP 126/75      Heart Rate 87      SpO2 Pulse       Resp 16      Temp 36 ??C (96.8 ??F)      Temp Source Tympanic      SpO2 100 %     Constitutional: Well-appearing, in no acute distress  Eyes: Conjunctivae are normal.  ENT       Head: Normocephalic and atraumatic.       Nose: No congestion.       Mouth/Throat: Mucous membranes are moist.       Neck: No stridor.  Hematological/Lymphatic/Immunilogical: No cervical lymphadenopathy.  Cardiovascular: RRR, no murmurs; normal and symmetric distal pulses are present in all extremities.  Respiratory: Lungs clear to ascultation bilaterally, no wheezes, crackles, rales; no increased work of breathing.   Gastrointestinal: Soft, non-distended, non-tender; no rebound tenderness or guarding.   Musculoskeletal: Normal range of motion in all extremities. No lower extremity edema or tenderness.   Neurologic: Cranial nerves II-XII intact. Normal speech and language. No gross focal neurologic deficits are appreciated.  Skin: Skin is warm, dry and intact. No rash noted.  Psychiatric: Mood and affect are normal. Speech and behavior are normal.         Radiology     CT head WO contrast   Final Result   -- No acute intracranial abnormality.   -- Unchanged chronic small vessel ischemic changes and cortical volume loss.      ====================   ADDENDUM (11/29/2019 6:26 PM):    On review, the following additional findings were noted:      Due to concern for concern for Wernicke's encephalopathy, recommend brain MRI with contrast.              Pertinent labs & imaging results that were available during my care of the patient were reviewed by me and considered in my medical decision making (see chart for details).     Please note- This chart has been created using [employer]. Chart creation errors have been sought, but may not always be located and such creation errors, especially pronoun confusion, do NOT reflect on the standard of medical care.        Documentation assistance was provided by Pecola Leisure, Scribe, on November 29, 2019 at 2:33 PM for Dr. Clancy Gourd, MD.     Documentation assistance was provided by the scribe in my presence.  The documentation recorded by the scribe has been reviewed by me and accurately reflects the services I personally performed.    Clancy Gourd, MD, MPH           Clancy Gourd, MD  Resident  11/30/19 [phone]
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
## 2019-11-29 NOTE — Unmapped (Signed)
Provider in with pt,
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
## 2019-11-29 NOTE — Unmapped (Signed)
Pt changed out,

## 2019-11-30 DIAGNOSIS — M545 Low back pain, unspecified: Principal | ICD-10-CM

## 2019-11-30 DIAGNOSIS — L4050 Arthropathic psoriasis, unspecified: Principal | ICD-10-CM

## 2019-11-30 DIAGNOSIS — Z20822 Contact with and (suspected) exposure to covid-19: Principal | ICD-10-CM

## 2019-11-30 DIAGNOSIS — K219 Gastro-esophageal reflux disease without esophagitis: Principal | ICD-10-CM

## 2019-11-30 DIAGNOSIS — I10 Essential (primary) hypertension: Principal | ICD-10-CM

## 2019-11-30 DIAGNOSIS — F319 Bipolar disorder, unspecified: Principal | ICD-10-CM

## 2019-11-30 DIAGNOSIS — R4182 Altered mental status, unspecified: Principal | ICD-10-CM

## 2019-11-30 DIAGNOSIS — E871 Hypo-osmolality and hyponatremia: Principal | ICD-10-CM

## 2019-11-30 DIAGNOSIS — R270 Ataxia, unspecified: Principal | ICD-10-CM

## 2019-11-30 DIAGNOSIS — F17210 Nicotine dependence, cigarettes, uncomplicated: Principal | ICD-10-CM

## 2019-11-30 DIAGNOSIS — F419 Anxiety disorder, unspecified: Principal | ICD-10-CM

## 2019-11-30 DIAGNOSIS — D539 Nutritional anemia, unspecified: Principal | ICD-10-CM

## 2019-11-30 DIAGNOSIS — F1010 Alcohol abuse, uncomplicated: Principal | ICD-10-CM

## 2019-11-30 DIAGNOSIS — G629 Polyneuropathy, unspecified: Principal | ICD-10-CM

## 2019-11-30 LAB — CBC W/ AUTO DIFF
BASOPHILS ABSOLUTE COUNT: 0 10*9/L (ref 0.0–0.1)
BASOPHILS RELATIVE PERCENT: 0.6 %
EOSINOPHILS ABSOLUTE COUNT: 0.3 10*9/L (ref 0.0–0.4)
EOSINOPHILS RELATIVE PERCENT: 4.7 %
HEMATOCRIT: 37.2 % — ABNORMAL LOW (ref 41.0–53.0)
HEMOGLOBIN: 12.2 g/dL — ABNORMAL LOW (ref 13.5–17.5)
LARGE UNSTAINED CELLS: 3 % (ref 0–4)
LYMPHOCYTES ABSOLUTE COUNT: 2.3 10*9/L (ref 1.5–5.0)
LYMPHOCYTES RELATIVE PERCENT: 31.4 %
MEAN CORPUSCULAR HEMOGLOBIN CONC: 32.7 g/dL (ref 31.0–37.0)
MEAN CORPUSCULAR HEMOGLOBIN: 34.9 pg — ABNORMAL HIGH (ref 26.0–34.0)
MEAN CORPUSCULAR VOLUME: 106.7 fL — ABNORMAL HIGH (ref 80.0–100.0)
MEAN PLATELET VOLUME: 8.5 fL (ref 7.0–10.0)
MONOCYTES ABSOLUTE COUNT: 0.4 10*9/L (ref 0.2–0.8)
MONOCYTES RELATIVE PERCENT: 5.9 %
NEUTROPHILS ABSOLUTE COUNT: 3.9 10*9/L (ref 2.0–7.5)
NEUTROPHILS RELATIVE PERCENT: 54.7 %
PLATELET COUNT: 324 10*9/L (ref 150–440)
RED BLOOD CELL COUNT: 3.49 10*12/L — ABNORMAL LOW (ref 4.50–5.90)
RED CELL DISTRIBUTION WIDTH: 13.3 % (ref 12.0–15.0)
WBC ADJUSTED: 7.2 10*9/L (ref 4.5–11.0)

## 2019-11-30 LAB — COMPREHENSIVE METABOLIC PANEL
ALBUMIN: 3.3 g/dL — ABNORMAL LOW (ref 3.4–5.0)
ALKALINE PHOSPHATASE: 71 U/L (ref 46–116)
ALT (SGPT): 15 U/L (ref 10–49)
ANION GAP: 7 mmol/L (ref 5–14)
AST (SGOT): 21 U/L (ref <=34)
BILIRUBIN TOTAL: 0.3 mg/dL (ref 0.3–1.2)
BLOOD UREA NITROGEN: 11 mg/dL (ref 9–23)
BUN / CREAT RATIO: 14
CALCIUM: 9 mg/dL (ref 8.7–10.4)
CHLORIDE: 101 mmol/L (ref 98–107)
CO2: 23 mmol/L (ref 20.0–31.0)
CREATININE: 0.76 mg/dL
EGFR CKD-EPI AA MALE: >90 mL/min/{1.73_m2} (ref >=60)
EGFR CKD-EPI NON-AA MALE: >90 mL/min/{1.73_m2} (ref >=60)
GLUCOSE RANDOM: 92 mg/dL (ref 70–179)
POTASSIUM: 4.1 mmol/L (ref 3.4–4.5)
PROTEIN TOTAL: 6.8 g/dL (ref 5.7–8.2)
SODIUM: 131 mmol/L — ABNORMAL LOW (ref 135–145)

## 2019-11-30 LAB — SLIDE REVIEW

## 2019-11-30 LAB — PROTIME-INR
INR: 0.98
PROTIME: 11.5 s (ref 10.3–13.4)

## 2019-11-30 LAB — CERULOPLASMIN: CERULOPLASMIN: 30 mg/dL (ref 15.0–52.0)

## 2019-11-30 LAB — MAGNESIUM: MAGNESIUM: 1.6 mg/dL (ref 1.6–2.6)

## 2019-11-30 MED ADMIN — thiamine (B-1) 500 mg in sodium chloride (NS) 0.9 % 100 mL IVPB: 500 mg | INTRAVENOUS | @ 12:00:00 | Stop: 2019-12-06

## 2019-11-30 MED ADMIN — citalopram (CeleXA) tablet 10 mg: 10 mg | ORAL | @ 15:00:00 | Stop: 2019-11-30

## 2019-11-30 MED ADMIN — diclofenac sodium (VOLTAREN) 1 % gel 2 g: 2 g | TOPICAL | @ 22:00:00 | Stop: 2019-12-07

## 2019-11-30 MED ADMIN — ibuprofen (MOTRIN) tablet 400 mg: 400 mg | ORAL | @ 20:00:00 | Stop: 2019-12-07

## 2019-11-30 MED ADMIN — pantoprazole (PROTONIX) EC tablet 20 mg: 20 mg | ORAL | @ 15:00:00 | Stop: 2019-12-07

## 2019-11-30 MED ADMIN — magnesium oxide (MAG-OX) tablet 400 mg: 400 mg | ORAL | @ 15:00:00 | Stop: 2019-12-07

## 2019-11-30 MED ADMIN — folic acid (FOLVITE) tablet 1 mg: 1 mg | ORAL | @ 15:00:00 | Stop: 2019-12-07

## 2019-11-30 MED ADMIN — thiamine (B-1) 500 mg in sodium chloride (NS) 0.9 % 100 mL IVPB: 500 mg | INTRAVENOUS | @ 02:00:00 | Stop: 2019-11-29

## 2019-11-30 MED ADMIN — pyridoxine (vitamin B6) (B-6) tablet 100 mg: 100 mg | ORAL | @ 15:00:00 | Stop: 2019-12-07

## 2019-11-30 MED ADMIN — fluticasone propionate (FLONASE) 50 mcg/actuation nasal spray 2 spray: 2 | NASAL | @ 15:00:00 | Stop: 2019-12-07

## 2019-11-30 MED ADMIN — acetaminophen (TYLENOL) tablet 650 mg: 650 mg | ORAL | @ 07:00:00 | Stop: 2019-12-07

## 2019-11-30 MED ADMIN — lisinopriL (PRINIVIL,ZESTRIL) tablet 20 mg: 20 mg | ORAL | @ 15:00:00 | Stop: 2019-12-07

## 2019-11-30 MED ADMIN — multivitamins, therapeutic with minerals tablet 1 tablet: 1 | ORAL | @ 15:00:00 | Stop: 2019-12-07

## 2019-11-30 MED ADMIN — cetirizine (ZyrTEC) tablet 10 mg: 10 mg | ORAL | @ 15:00:00 | Stop: 2019-12-07

## 2019-11-30 MED ADMIN — magnesium sulfate 2gm/50mL IVPB: 2 g | INTRAVENOUS | @ 23:00:00 | Stop: 2019-11-30

## 2019-11-30 MED ADMIN — diclofenac sodium (VOLTAREN) 1 % gel 2 g: 2 g | TOPICAL | @ 20:00:00 | Stop: 2019-12-07

## 2019-11-30 MED ADMIN — thiamine (B-1) 500 mg in sodium chloride (NS) 0.9 % 100 mL IVPB: 500 mg | INTRAVENOUS | @ 20:00:00 | Stop: 2019-12-06

## 2019-11-30 MED ADMIN — magnesium oxide (MAG-OX) tablet 400 mg: 400 mg | ORAL | @ 07:00:00 | Stop: 2019-12-07

## 2019-11-30 MED ADMIN — nicotine (NICODERM CQ) 21 mg/24 hr patch 1 patch: 1 | TRANSDERMAL | @ 15:00:00 | Stop: 2019-12-07

## 2019-11-30 NOTE — Unmapped (Signed)
Pt's wife at bedside.

## 2019-11-30 NOTE — Unmapped (Signed)
Psych At bedside
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
## 2019-11-30 NOTE — Unmapped (Signed)
Patient state this NA that I am going to [facility] I am not staying here this NA explain that he is waiting room as soon as ready you will be transfer to patient room.

## 2019-11-30 NOTE — Unmapped (Signed)
Pt transported to CT 1 and back to room [facility] hospital police without any incidence

## 2019-11-30 NOTE — Unmapped (Signed)
Pt received dinner tray, siting up in bed and eating w/ assistance from wife
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
## 2019-11-30 NOTE — Unmapped (Signed)
PSYCHIATRIC EMERGENCY SERVICE                     PROGRESS NOTE    Spoke with Clancy Gourd, MD regarding consultation for this patient due to concerning sx of mania without hx of bipolar disorder. At this time, MD presents concerns of Wernicke's encephalopathy vs worsening cognitive decline 2/2 chronic alcohol use vs underlying psychiatric dx. Dr. Mylinda Latina plans to consult neuro to rule out Wernicke's encephalopathy for full medical clearance, as well as address hyponatremia and hypomagnesemia. Pending full medical clearance, EM to repage PES for consult.    Raechel Chute, PMHNP  [facility] Psychiatric Emergency Service
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
## 2019-12-01 LAB — T4, FREE: FREE T4: 1.43 ng/dL (ref 0.89–1.76)

## 2019-12-01 LAB — COPPER, SERUM: COPPER: 1.02 ug/mL

## 2019-12-01 LAB — VITAMIN B12: VITAMIN B-12: 388 pg/mL (ref 211–911)

## 2019-12-01 MED ADMIN — gadobenate dimeglumine (MULTIHANCE) 529 mg/mL (0.1mmol/0.2mL) solution 16 mL: 16 mL | INTRAVENOUS | @ 05:00:00 | Stop: 2019-11-30

## 2019-12-01 MED ADMIN — diclofenac sodium (VOLTAREN) 1 % gel 2 g: 2 g | TOPICAL | @ 22:00:00 | Stop: 2019-12-07

## 2019-12-01 MED ADMIN — pyridoxine (vitamin B6) (B-6) tablet 100 mg: 100 mg | ORAL | @ 13:00:00 | Stop: 2019-12-07

## 2019-12-01 MED ADMIN — OLANZapine zydis (ZyPREXA) disintegrating tablet 2.5 mg: 2.5 mg | ORAL | @ 02:00:00 | Stop: 2019-12-01

## 2019-12-01 MED ADMIN — ibuprofen (MOTRIN) tablet 400 mg: 400 mg | ORAL | @ 14:00:00 | Stop: 2019-12-07

## 2019-12-01 MED ADMIN — nicotine (NICODERM CQ) 21 mg/24 hr patch 1 patch: 1 | TRANSDERMAL | @ 14:00:00 | Stop: 2019-12-07

## 2019-12-01 MED ADMIN — acetaminophen (TYLENOL) tablet 650 mg: 650 mg | ORAL | @ 18:00:00 | Stop: 2019-12-07

## 2019-12-01 MED ADMIN — diclofenac sodium (VOLTAREN) 1 % gel 2 g: 2 g | TOPICAL | @ 11:00:00 | Stop: 2019-12-07

## 2019-12-01 MED ADMIN — thiamine (B-1) 500 mg in sodium chloride (NS) 0.9 % 100 mL IVPB: 500 mg | INTRAVENOUS | @ 11:00:00 | Stop: 2019-12-06

## 2019-12-01 MED ADMIN — thiamine (B-1) 500 mg in sodium chloride (NS) 0.9 % 100 mL IVPB: 500 mg | INTRAVENOUS | @ 19:00:00 | Stop: 2019-12-06

## 2019-12-01 MED ADMIN — magnesium oxide (MAG-OX) tablet 400 mg: 400 mg | ORAL | @ 02:00:00 | Stop: 2019-12-07

## 2019-12-01 MED ADMIN — diclofenac sodium (VOLTAREN) 1 % gel 2 g: 2 g | TOPICAL | @ 02:00:00 | Stop: 2019-12-07

## 2019-12-01 MED ADMIN — cetirizine (ZyrTEC) tablet 10 mg: 10 mg | ORAL | @ 13:00:00 | Stop: 2019-12-07

## 2019-12-01 MED ADMIN — LORazepam (ATIVAN) injection 1 mg: 1 mg | INTRAVENOUS | @ 04:00:00 | Stop: 2019-11-30

## 2019-12-01 MED ADMIN — folic acid (FOLVITE) tablet 1 mg: 1 mg | ORAL | @ 13:00:00 | Stop: 2019-12-07

## 2019-12-01 MED ADMIN — thiamine (B-1) 500 mg in sodium chloride (NS) 0.9 % 100 mL IVPB: 500 mg | INTRAVENOUS | @ 03:00:00 | Stop: 2019-12-06

## 2019-12-01 MED ADMIN — lisinopriL (PRINIVIL,ZESTRIL) tablet 20 mg: 20 mg | ORAL | @ 13:00:00 | Stop: 2019-12-07

## 2019-12-01 MED ADMIN — magnesium oxide (MAG-OX) tablet 400 mg: 400 mg | ORAL | @ 13:00:00 | Stop: 2019-12-07

## 2019-12-01 MED ADMIN — pantoprazole (PROTONIX) EC tablet 20 mg: 20 mg | ORAL | @ 13:00:00 | Stop: 2019-12-07

## 2019-12-01 MED ADMIN — multivitamins, therapeutic with minerals tablet 1 tablet: 1 | ORAL | @ 13:00:00 | Stop: 2019-12-07

## 2019-12-01 NOTE — Unmapped (Signed)
Order was placed for a PIV by Venous Access Team (VAT).  Patient was assessed at bedside for placement of a PIV. Mask and protective eyewear were donned. Access was obtained. Blood return noted.  Dressing intact and device well secured.  Flushed with normal saline.  See LDA for details.  Pt advised to inform RN of any s/s of discomfort at the PIV site.    Workup / Procedure Time:  30 minutes       Care RN was notified.       Thank you,     Lyn Records RN Venous Access Team
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
## 2019-12-01 NOTE — Unmapped (Signed)
Patient alert and oriented x 4, VSS and all medications administered as ordered per MAR. Patient on room air. Voltaren gel applied for pain. 1x dose of ativan given prior to MRI. PNA at bedside for elopement precautions. No acute events or falls to report. Continue plan of care.  Problem: Fall Injury Risk  Goal: Absence of Fall and Fall-Related Injury  Outcome: Progressing  Intervention: Promote Scientist, clinical (histocompatibility and immunogenetics) Documentation  Taken 11/30/2019 2000 by Creed Copper, RN  Safety Interventions:   elopement precautions   bed alarm   fall reduction program maintained   lighting adjusted for tasks/safety   low bed   nonskid shoes/slippers when out of bed   room near unit station   sitter at bedside     Problem: Self-Care Deficit  Goal: Improved Ability to Complete Activities of Daily Living  Outcome: Progressing     Problem: Adult Inpatient Plan of Care  Goal: Patient-Specific Goal (Individualized)  Outcome: Progressing  Flowsheets (Taken 12/01/2019 0657)  Patient-Specific Goals (Include Timeframe): Patient will get MRI this shift.  Goal: Absence of Hospital-Acquired Illness or Injury  Outcome: Progressing  Intervention: Identify and Manage Fall Risk  Recent Flowsheet Documentation  Taken 11/30/2019 2000 by Creed Copper, RN  Safety Interventions:   elopement precautions   bed alarm   fall reduction program maintained   lighting adjusted for tasks/safety   low bed   nonskid shoes/slippers when out of bed   room near unit station   sitter at bedside  Intervention: Prevent and Manage VTE (Venous Thromboembolism) Risk  Recent Flowsheet Documentation  Taken 11/30/2019 2000 by Creed Copper, RN  Activity Management:   activity adjusted per tolerance   ambulated to bathroom  Goal: Optimal Comfort and Wellbeing  Outcome: Progressing  Goal: Readiness for Transition of Care  Outcome: Progressing     Problem: Hypertension Comorbidity  Goal: Blood Pressure in Desired Range  Outcome: Progressing     Problem: Pain Chronic (Persistent) (Comorbidity Management)  Goal: Acceptable Pain Control and Functional Ability  Outcome: Progressing

## 2019-12-01 NOTE — Unmapped (Signed)
Patient arrived to unit this afternoon, spouse at bedside. Able to correctly answer all orientation questions, patient noted to be hyperverbal. Pupils asymmetrical; (see assessment) patient and spouse report this as baseline, team notified. Patient planned for MRI. Maintains oxygen saturation on room air. PRN ibuprofen administered for chronic back pain (see MAR). PNA at bedside, fall precautions in place, bed alarm active and audible. Medications reviewed with patient and administered per MAR. Patient encouraged to call for all needs, ambulates with walker. Plan of care continues, will report to oncoming shift.      Problem: Adult Inpatient Plan of Care  Goal: Plan of Care Review  Outcome: Ongoing - Unchanged  Goal: Patient-Specific Goal (Individualized)  Outcome: Ongoing - Unchanged  Goal: Absence of Hospital-Acquired Illness or Injury  Outcome: Ongoing - Unchanged  Goal: Optimal Comfort and Wellbeing  Outcome: Ongoing - Unchanged  Goal: Readiness for Transition of Care  Outcome: Ongoing - Unchanged  Goal: Rounds/Family Conference  Outcome: Ongoing - Unchanged     Problem: Fall Injury Risk  Goal: Absence of Fall and Fall-Related Injury  Outcome: Ongoing - Unchanged     Problem: Self-Care Deficit  Goal: Improved Ability to Complete Activities of Daily Living  Outcome: Ongoing - Unchanged

## 2019-12-01 NOTE — Unmapped (Signed)
Afebrile with VSS thus far in shift. Maintains SpO2 on room air. Continues to receive IV Thiamine per MAR, A&Ox3 (disoriented to situation), hyperverbal. Pupils asymmetrical at baseline per patient and spouse report (see assessment), team aware. Patient reports adequate management of chronic back pain with PRN ibuprofen and tylenol (see MAR). Fall precautions remain in place. PNA remains at bedside for elopement precautions/IVC. Medications reviewed with patient and administered per MAR. Plan of care continues, will report to oncoming shift.    Problem: Adult Inpatient Plan of Care  Goal: Plan of Care Review  Outcome: Ongoing - Unchanged  Goal: Patient-Specific Goal (Individualized)  Outcome: Ongoing - Unchanged  Goal: Absence of Hospital-Acquired Illness or Injury  Outcome: Ongoing - Unchanged  Intervention: Identify and Manage Fall Risk  Recent Flowsheet Documentation  Taken 12/01/2019 1000 by Gertha Calkin, RN  Safety Interventions:   elopement precautions   sitter at bedside  Taken 12/01/2019 0800 by Gertha Calkin, RN  Safety Interventions: sitter at bedside  Goal: Optimal Comfort and Wellbeing  Outcome: Ongoing - Unchanged  Goal: Readiness for Transition of Care  Outcome: Ongoing - Unchanged  Goal: Rounds/Family Conference  Outcome: Ongoing - Unchanged     Problem: Fall Injury Risk  Goal: Absence of Fall and Fall-Related Injury  Outcome: Ongoing - Unchanged  Intervention: Promote Injury-Free Environment  Recent Flowsheet Documentation  Taken 12/01/2019 1000 by Gertha Calkin, RN  Safety Interventions:   elopement precautions   sitter at bedside  Taken 12/01/2019 0800 by Gertha Calkin, RN  Safety Interventions: sitter at bedside     Problem: Pain Chronic (Persistent) (Comorbidity Management)  Goal: Acceptable Pain Control and Functional Ability  Outcome: Ongoing - Unchanged
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
## 2019-12-02 DIAGNOSIS — D539 Nutritional anemia, unspecified: Principal | ICD-10-CM

## 2019-12-02 DIAGNOSIS — G629 Polyneuropathy, unspecified: Principal | ICD-10-CM

## 2019-12-02 DIAGNOSIS — F17210 Nicotine dependence, cigarettes, uncomplicated: Principal | ICD-10-CM

## 2019-12-02 DIAGNOSIS — R4182 Altered mental status, unspecified: Principal | ICD-10-CM

## 2019-12-02 DIAGNOSIS — E871 Hypo-osmolality and hyponatremia: Principal | ICD-10-CM

## 2019-12-02 DIAGNOSIS — F419 Anxiety disorder, unspecified: Principal | ICD-10-CM

## 2019-12-02 DIAGNOSIS — L4050 Arthropathic psoriasis, unspecified: Principal | ICD-10-CM

## 2019-12-02 DIAGNOSIS — K219 Gastro-esophageal reflux disease without esophagitis: Principal | ICD-10-CM

## 2019-12-02 DIAGNOSIS — R270 Ataxia, unspecified: Principal | ICD-10-CM

## 2019-12-02 DIAGNOSIS — F1010 Alcohol abuse, uncomplicated: Principal | ICD-10-CM

## 2019-12-02 DIAGNOSIS — I10 Essential (primary) hypertension: Principal | ICD-10-CM

## 2019-12-02 DIAGNOSIS — F319 Bipolar disorder, unspecified: Principal | ICD-10-CM

## 2019-12-02 DIAGNOSIS — M545 Low back pain, unspecified: Principal | ICD-10-CM

## 2019-12-02 DIAGNOSIS — Z20822 Contact with and (suspected) exposure to covid-19: Principal | ICD-10-CM

## 2019-12-02 MED ADMIN — folic acid (FOLVITE) tablet 1 mg: 1 mg | ORAL | @ 13:00:00 | Stop: 2019-12-07

## 2019-12-02 MED ADMIN — magnesium oxide (MAG-OX) tablet 400 mg: 400 mg | ORAL | @ 01:00:00 | Stop: 2019-12-07

## 2019-12-02 MED ADMIN — acetaminophen (TYLENOL) tablet 650 mg: 650 mg | ORAL | @ 22:00:00 | Stop: 2019-12-07

## 2019-12-02 MED ADMIN — ibuprofen (MOTRIN) tablet 400 mg: 400 mg | ORAL | @ 13:00:00 | Stop: 2019-12-07

## 2019-12-02 MED ADMIN — thiamine (B-1) 500 mg in sodium chloride (NS) 0.9 % 100 mL IVPB: 500 mg | INTRAVENOUS | @ 19:00:00 | Stop: 2019-12-06

## 2019-12-02 MED ADMIN — thiamine (B-1) 500 mg in sodium chloride (NS) 0.9 % 100 mL IVPB: 500 mg | INTRAVENOUS | @ 12:00:00 | Stop: 2019-12-06

## 2019-12-02 MED ADMIN — magnesium oxide (MAG-OX) tablet 400 mg: 400 mg | ORAL | @ 13:00:00 | Stop: 2019-12-07

## 2019-12-02 MED ADMIN — pantoprazole (PROTONIX) EC tablet 20 mg: 20 mg | ORAL | @ 13:00:00 | Stop: 2019-12-07

## 2019-12-02 MED ADMIN — nicotine (NICODERM CQ) 21 mg/24 hr patch 1 patch: 1 | TRANSDERMAL | @ 13:00:00 | Stop: 2019-12-07

## 2019-12-02 MED ADMIN — diclofenac sodium (VOLTAREN) 1 % gel 2 g: 2 g | TOPICAL | @ 01:00:00 | Stop: 2019-12-07

## 2019-12-02 MED ADMIN — thiamine (B-1) 500 mg in sodium chloride (NS) 0.9 % 100 mL IVPB: 500 mg | INTRAVENOUS | @ 02:00:00 | Stop: 2019-12-06

## 2019-12-02 MED ADMIN — lisinopriL (PRINIVIL,ZESTRIL) tablet 20 mg: 20 mg | ORAL | @ 13:00:00 | Stop: 2019-12-07

## 2019-12-02 MED ADMIN — cetirizine (ZyrTEC) tablet 10 mg: 10 mg | ORAL | @ 13:00:00 | Stop: 2019-12-07

## 2019-12-02 MED ADMIN — fluticasone propionate (FLONASE) 50 mcg/actuation nasal spray 2 spray: 2 | NASAL | @ 13:00:00 | Stop: 2019-12-07

## 2019-12-02 MED ADMIN — acetaminophen (TYLENOL) tablet 650 mg: 650 mg | ORAL | @ 05:00:00 | Stop: 2019-12-07

## 2019-12-02 MED ADMIN — OLANZapine zydis (ZyPREXA) disintegrating tablet 5 mg: 5 mg | ORAL | @ 01:00:00 | Stop: 2019-12-07

## 2019-12-02 MED ADMIN — diclofenac sodium (VOLTAREN) 1 % gel 2 g: 2 g | TOPICAL | @ 12:00:00 | Stop: 2019-12-07

## 2019-12-02 MED ADMIN — diclofenac sodium (VOLTAREN) 1 % gel 2 g: 2 g | TOPICAL | @ 17:00:00 | Stop: 2019-12-07

## 2019-12-02 MED ADMIN — calcium carbonate (TUMS) chewable tablet 400 mg of elem calcium: 400 mg | ORAL | @ 22:00:00 | Stop: 2019-12-07

## 2019-12-02 MED ADMIN — melatonin tablet 6 mg: 6 mg | ORAL | @ 01:00:00 | Stop: 2019-12-07

## 2019-12-02 MED ADMIN — multivitamins, therapeutic with minerals tablet 1 tablet: 1 | ORAL | @ 13:00:00 | Stop: 2019-12-07

## 2019-12-02 MED ADMIN — diclofenac sodium (VOLTAREN) 1 % gel 2 g: 2 g | TOPICAL | @ 22:00:00 | Stop: 2019-12-07

## 2019-12-02 MED ADMIN — pyridoxine (vitamin B6) (B-6) tablet 100 mg: 100 mg | ORAL | @ 13:00:00 | Stop: 2019-12-07
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
## 2019-12-03 MED ADMIN — cetirizine (ZyrTEC) tablet 10 mg: 10 mg | ORAL | @ 14:00:00 | Stop: 2019-12-07

## 2019-12-03 MED ADMIN — ibuprofen (MOTRIN) tablet 400 mg: 400 mg | ORAL | @ 19:00:00 | Stop: 2019-12-07

## 2019-12-03 MED ADMIN — magnesium oxide (MAG-OX) tablet 400 mg: 400 mg | ORAL | @ 02:00:00 | Stop: 2019-12-07

## 2019-12-03 MED ADMIN — thiamine (B-1) 500 mg in sodium chloride (NS) 0.9 % 100 mL IVPB: 500 mg | INTRAVENOUS | @ 22:00:00 | Stop: 2019-12-06

## 2019-12-03 MED ADMIN — diclofenac sodium (VOLTAREN) 1 % gel 2 g: 2 g | TOPICAL | @ 22:00:00 | Stop: 2019-12-07

## 2019-12-03 MED ADMIN — multivitamins, therapeutic with minerals tablet 1 tablet: 1 | ORAL | @ 14:00:00 | Stop: 2019-12-07

## 2019-12-03 MED ADMIN — pantoprazole (PROTONIX) EC tablet 20 mg: 20 mg | ORAL | @ 14:00:00 | Stop: 2019-12-07

## 2019-12-03 MED ADMIN — diclofenac sodium (VOLTAREN) 1 % gel 2 g: 2 g | TOPICAL | @ 07:00:00 | Stop: 2019-12-07

## 2019-12-03 MED ADMIN — acetaminophen (TYLENOL) tablet 650 mg: 650 mg | ORAL | @ 07:00:00 | Stop: 2019-12-07

## 2019-12-03 MED ADMIN — diclofenac sodium (VOLTAREN) 1 % gel 2 g: 2 g | TOPICAL | @ 19:00:00 | Stop: 2019-12-07

## 2019-12-03 MED ADMIN — fluticasone propionate (FLONASE) 50 mcg/actuation nasal spray 2 spray: 2 | NASAL | @ 14:00:00 | Stop: 2019-12-07

## 2019-12-03 MED ADMIN — melatonin tablet 6 mg: 6 mg | ORAL | @ 04:00:00 | Stop: 2019-12-07

## 2019-12-03 MED ADMIN — thiamine (B-1) 500 mg in sodium chloride (NS) 0.9 % 100 mL IVPB: 500 mg | INTRAVENOUS | @ 02:00:00 | Stop: 2019-12-06

## 2019-12-03 MED ADMIN — thiamine (B-1) 500 mg in sodium chloride (NS) 0.9 % 100 mL IVPB: 500 mg | INTRAVENOUS | @ 10:00:00 | Stop: 2019-12-06

## 2019-12-03 MED ADMIN — magnesium oxide (MAG-OX) tablet 400 mg: 400 mg | ORAL | @ 14:00:00 | Stop: 2019-12-07

## 2019-12-03 MED ADMIN — nicotine (NICODERM CQ) 21 mg/24 hr patch 1 patch: 1 | TRANSDERMAL | @ 14:00:00 | Stop: 2019-12-07

## 2019-12-03 MED ADMIN — lisinopriL (PRINIVIL,ZESTRIL) tablet 20 mg: 20 mg | ORAL | @ 14:00:00 | Stop: 2019-12-07

## 2019-12-03 MED ADMIN — folic acid (FOLVITE) tablet 1 mg: 1 mg | ORAL | @ 14:00:00 | Stop: 2019-12-07

## 2019-12-03 MED ADMIN — pyridoxine (vitamin B6) (B-6) tablet 100 mg: 100 mg | ORAL | @ 14:00:00 | Stop: 2019-12-07

## 2019-12-03 MED ADMIN — OLANZapine zydis (ZyPREXA) disintegrating tablet 5 mg: 5 mg | ORAL | @ 02:00:00 | Stop: 2019-12-07

## 2019-12-03 NOTE — Unmapped (Signed)
Problem: Fall Injury Risk  Goal: Absence of Fall and Fall-Related Injury  Outcome: Ongoing - Unchanged     Problem: Self-Care Deficit  Goal: Improved Ability to Complete Activities of Daily Living  Outcome: Ongoing - Unchanged     Problem: Adult Inpatient Plan of Care  Goal: Plan of Care Review  Outcome: Ongoing - Unchanged  Goal: Patient-Specific Goal (Individualized)  Outcome: Ongoing - Unchanged  Goal: Absence of Hospital-Acquired Illness or Injury  Outcome: Ongoing - Unchanged  Goal: Optimal Comfort and Wellbeing  Outcome: Ongoing - Unchanged  Goal: Readiness for Transition of Care  Outcome: Ongoing - Unchanged  Goal: Rounds/Family Conference  Outcome: Ongoing - Unchanged     Problem: Hypertension Comorbidity  Goal: Blood Pressure in Desired Range  Outcome: Ongoing - Unchanged     Problem: Pain Chronic (Persistent) (Comorbidity Management)  Goal: Acceptable Pain Control and Functional Ability  Outcome: Ongoing - Unchanged     Problem: Manic Behavior Episode  Goal: Decreased Manic Symptoms  Outcome: Ongoing - Unchanged   Stable on initial assessments but remains  restless and excessively verbal . Denies pain and discomfort , will continue to monitor

## 2019-12-03 NOTE — Unmapped (Signed)
Order was placed for a PIV by Venous Access Team (VAT).  Patient was assessed at bedside for placement of a PIV. Mask and protective eyewear were donned. Access was obtained. Blood return noted.  Dressing intact and device well secured.  Flushed with normal saline.  See LDA for details.  Pt advised to inform RN of any s/s of discomfort at the PIV site.    Workup / Procedure Time:  15 minutes        RN was notified.       Thank you,     Marylee Floras RN Venous Access Team
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
## 2019-12-03 NOTE — Unmapped (Signed)
ROUTINE EEG REPORT     HISTORY   Nicholas Rush is 54 y.o. years old male with a history of behavioral changes     Indication for EEG: Encephalopathy    Pertinent medications/ sedatives: No sedatives or anti seizure medications    Patient State: Awake    TECHNICAL DETAILS   Routine EEG was performed while awake utilizing 21 active electrodes placed according to the international 10-20 system.  The study was recorded digitally with a bandpass of 1-70Hz  and a sampling rate of 200Hz  and was reviewed with the possibility of multiple reformatting.     Duration of recording:  20 Minutes     Attending Physician:  Kristie Cowman. Denyla Cortese, MD    EEG FINDINGS    Background Activity:  The EEG background during waking consisted of symmetric 9 Hz alpha activity which attenuated with eye opening.  Normal amplitude    Drowsiness is characterized by attenuation of the background and bilateral theta slowing. Stage II sleep was not present    Seizure or Epileptiform Discharges: No    Activating Procedures: Hyperventilation: Not Done  Photic stimulation: Symmetric driving response     EKG  regular rhythm and normal rate     IMPRESSION  Normal waking EEG     CLINICAL CORRELATION  A normal EEG cannot completely exclude the possibility of epilepsy       Interpreting Attending: Bunnie Pion, MD
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
## 2019-12-03 NOTE — Unmapped (Signed)
Initial Consult Note        Requesting Attending Physician:  Yisroel Ramming, MD  Service Requesting Consult: [facility])     Assessment and Plan          Nicholas Rush is a 54 y.o. male with PMH chronic EtOH use, depression, anxiety, psoriatic arthritis on humara on whom I have been asked by Yisroel Ramming, MD to consult for behavior abnormalities and ataxia.    Mania and ataxia  Presentation of increased activity, tangential and pressured speech, agitation most consistent with mania. On exam, alertness, recent/remote memory intact, naming/repetition, language intact. He has mild ataxia in all extremities as well as diminished vibration sense in bilateral feet, diminished cold temperature, and decreased ankle jerk reflexes, all of which are potentially consistent with a polyneuropathy seen with chronic EtOH use. Although he has ataxia and significant EtOH use, MRI findings, absent ophthalmoplegia and otherwise intact mental status exam make Wernicke's less likely. He does not have evidence of T2/FLAIR signal changes on MRI or lateralizing neurological deficits making anti-TNF-alpha toxicity less likely. In the absence of movement disorder or seizures (not related to EtOH withdraw), other neuroinflammatory disease, like autoimmune encephalitis (some of which can present with mania), is less likely. Most case reports of secondary mania after TBI involve co-existing brain lesions, typically with temporal lobe involvement. Although he has global cerebral atrophy, it is quite mild and there is minimal temporal lobe involvement. Advise routine EEG as screening study to help guide yield of AE panel.        RECOMMENDATIONS:  - Routine EEG  - Management of mania per psychiatry  - Agree with repletion of thiamine and management of hyponatremia per primary team    This patient was seen & discussed with Dr. Lorenso Courier who agrees with the plan.     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        Objective        Temp:  [36.3 ??C-37 ??C] 37 ??C  Heart Rate:  [79] 79  Resp:  [16-18] 17  BP: (147-158)/(91-99) 154/99  MAP (mmHg):  [106-114] 114  SpO2:  [100 %] 100 %  I/O this shift:  In: 115 [IV Piggyback:115]  Out: 100 [Urine:100]    Physical Exam:  General Appearance: Well appearing. In no acute distress. Peanuts spilled on shirt and in beard.   HEENT: Head is atraumatic and normocephalic. Sclera anicteric without injection. Oropharyngeal membranes are moist with no erythema or exudate.  Neck: Supple.  Lungs: Normal work of breathing.   Heart: Regular rate and rhythm.  Abdomen: Soft, nontender, nondistended.  Extremities: No clubbing, cyanosis, or edema.    Neurological Examination:     Mental Status: Alert, conversant, able to follow conversation and interview. Spontaneous speech was fluent without word finding pauses, dysarthria, or paraphasic errors. Comprehension was intact. Memory for recent and remote events was intact.  Recalls 3 objects immediately and after 5 minutes.  Points to the ceiling and door accurately.   Names 3 objects.   Speech is pressured and tangential.     Cranial Nerves: Pupils 4mm L eye, 3mm R eye, reactive to light bilaterally.  Pursuit eye movements were uninterrupted with full  range and without >3 beats nystagmus. No nystagmus with midline gaze.   Facial sensation intact bilaterally to light touch in all three divisions of CNV.   Face symmetric at rest. Normal facial movement bilaterally, including forehead, eye closure and grimace/smile.   Hearing intact to conversation.   Shoulder shrug full strength bilaterally.   Palate movement is symmetric.   Tongue protrudes midline and tongue movements are normal.     Motor Exam: Normal bulk.   No resting tremors or other adventitious movement.   Pronator drift is absent.  (R/L) Strength 5/5 in deltoid, wrist extension, hand grip, hip flexion, quadriceps, hamstrings, plantarflexion and dorsiflexion. Biceps 4+/5, triceps 4+/5.     Reflexes: DTRs are 2+ and symmetric throughout, but 1+ in ankles. Toes are downgoing bilaterally.    Sensory: LT intact in hands and feet  Cold temperature decreased in L foot to ankle, normal in R foot, normal in bilateral hands  Vibration diminished in bilateral toes, normal at ankle. Normal in bilateral PIPs    Cerebellar/Coordination/Gait:  FTN with mild ataxia bilaterally, not worsened with eye closure. Heel-to-shin with mild ataxia bilaterally, L>R.  Romberg negative. Wide based gate with normal arm swing. Mild pseudoathetosis in bilateral hands, not observed in feet.        Diagnostic Studies      All Labs Last 24hrs:   Recent Results (from the past 24 hour(s))   ECG 12 Lead    Collection Time: 12/02/19  8:00 AM   Result Value Ref Range    EKG Systolic BP  mmHg    EKG Diastolic BP  mmHg    EKG Ventricular Rate 69 BPM    EKG Atrial Rate 69 BPM    EKG P-R Interval 116 ms    EKG QRS Duration 82 ms    EKG Q-T Interval 404 ms    EKG QTC Calculation 432 ms    EKG Calculated P Axis 8 degrees    EKG Calculated R Axis 16 degrees    EKG Calculated T Axis 17 degrees    QTC Fredericia 423 ms       MRI brain wwo contrast, 12/01/19, personally reviewed:    IMPRESSION:  Motion degraded exam.  ??  Mild global cerebral atrophy with mild hydrocephalus ex vacuo. Sequelae of chronic small vessel ischemic changes.  ??  ====================  ADDENDUM (12/01/2019 7:41 AM):   Attending note:  ??  - No evidence of Wernicke encephalopathy as clinically questioned.

## 2019-12-04 MED ADMIN — folic acid (FOLVITE) tablet 1 mg: 1 mg | ORAL | @ 14:00:00 | Stop: 2019-12-07

## 2019-12-04 MED ADMIN — diclofenac sodium (VOLTAREN) 1 % gel 2 g: 2 g | TOPICAL | Stop: 2019-12-07

## 2019-12-04 MED ADMIN — diclofenac sodium (VOLTAREN) 1 % gel 2 g: 2 g | TOPICAL | @ 16:00:00 | Stop: 2019-12-07

## 2019-12-04 MED ADMIN — nicotine (NICODERM CQ) 21 mg/24 hr patch 1 patch: 1 | TRANSDERMAL | @ 14:00:00 | Stop: 2019-12-07

## 2019-12-04 MED ADMIN — ibuprofen (MOTRIN) tablet 400 mg: 400 mg | ORAL | @ 01:00:00 | Stop: 2019-12-07

## 2019-12-04 MED ADMIN — OLANZapine zydis (ZyPREXA) disintegrating tablet 5 mg: 5 mg | ORAL | @ 01:00:00 | Stop: 2019-12-07

## 2019-12-04 MED ADMIN — ibuprofen (MOTRIN) tablet 400 mg: 400 mg | ORAL | @ 16:00:00 | Stop: 2019-12-07

## 2019-12-04 MED ADMIN — magnesium oxide (MAG-OX) tablet 400 mg: 400 mg | ORAL | @ 14:00:00 | Stop: 2019-12-07

## 2019-12-04 MED ADMIN — diclofenac sodium (VOLTAREN) 1 % gel 2 g: 2 g | TOPICAL | @ 04:00:00 | Stop: 2019-12-07

## 2019-12-04 MED ADMIN — melatonin tablet 6 mg: 6 mg | ORAL | @ 01:00:00 | Stop: 2019-12-07

## 2019-12-04 MED ADMIN — multivitamins, therapeutic with minerals tablet 1 tablet: 1 | ORAL | @ 14:00:00 | Stop: 2019-12-07

## 2019-12-04 MED ADMIN — thiamine (B-1) 500 mg in sodium chloride (NS) 0.9 % 100 mL IVPB: 500 mg | INTRAVENOUS | @ 20:00:00 | Stop: 2019-12-06

## 2019-12-04 MED ADMIN — thiamine (B-1) 500 mg in sodium chloride (NS) 0.9 % 100 mL IVPB: 500 mg | INTRAVENOUS | @ 10:00:00 | Stop: 2019-12-06

## 2019-12-04 MED ADMIN — acetaminophen (TYLENOL) tablet 650 mg: 650 mg | ORAL | Stop: 2019-12-07

## 2019-12-04 MED ADMIN — fluticasone propionate (FLONASE) 50 mcg/actuation nasal spray 2 spray: 2 | NASAL | @ 14:00:00 | Stop: 2019-12-07

## 2019-12-04 MED ADMIN — cetirizine (ZyrTEC) tablet 10 mg: 10 mg | ORAL | @ 14:00:00 | Stop: 2019-12-07

## 2019-12-04 MED ADMIN — thiamine (B-1) 500 mg in sodium chloride (NS) 0.9 % 100 mL IVPB: 500 mg | INTRAVENOUS | @ 04:00:00 | Stop: 2019-12-06

## 2019-12-04 MED ADMIN — pyridoxine (vitamin B6) (B-6) tablet 100 mg: 100 mg | ORAL | @ 14:00:00 | Stop: 2019-12-07

## 2019-12-04 MED ADMIN — magnesium oxide (MAG-OX) tablet 400 mg: 400 mg | ORAL | @ 01:00:00 | Stop: 2019-12-07

## 2019-12-04 MED ADMIN — lisinopriL (PRINIVIL,ZESTRIL) tablet 20 mg: 20 mg | ORAL | @ 14:00:00 | Stop: 2019-12-07

## 2019-12-04 MED ADMIN — pantoprazole (PROTONIX) EC tablet 20 mg: 20 mg | ORAL | @ 14:00:00 | Stop: 2019-12-07
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
## 2019-12-04 NOTE — Unmapped (Signed)
Pt VSS. Pt took all medication without any problems. Pt complained of pain and received pain medication. PNA at bedside. Bed in low, locked position. No acute changes will continue to monitor.    Problem: Adult Inpatient Plan of Care  Goal: Plan of Care Review  Outcome: Ongoing - Unchanged     Problem: Adult Inpatient Plan of Care  Goal: Patient-Specific Goal (Individualized)  Outcome: Ongoing - Unchanged     Problem: Adult Inpatient Plan of Care  Goal: Absence of Hospital-Acquired Illness or Injury  Outcome: Ongoing - Unchanged

## 2019-12-04 NOTE — Unmapped (Signed)
No acute events this shift.  Pt eager to go home.  Sitter at bedside.  Tolerated scheduled thiamine with no issues.  Denies pain. No falls.   Will cont to monitor.    Problem: Fall Injury Risk  Goal: Absence of Fall and Fall-Related Injury  Outcome: Progressing     Problem: Self-Care Deficit  Goal: Improved Ability to Complete Activities of Daily Living  Outcome: Progressing     Problem: Adult Inpatient Plan of Care  Goal: Plan of Care Review  Outcome: Progressing  Goal: Patient-Specific Goal (Individualized)  Outcome: Progressing  Goal: Absence of Hospital-Acquired Illness or Injury  Outcome: Progressing  Goal: Optimal Comfort and Wellbeing  Outcome: Progressing  Goal: Readiness for Transition of Care  Outcome: Progressing  Goal: Rounds/Family Conference  Outcome: Progressing

## 2019-12-04 NOTE — Unmapped (Signed)
Order was placed for a PIV by Venous Access Team (VAT).  Patient was assessed at bedside for placement of a PIV. Mask and protective eyewear were donned. Access was obtained. Blood return noted.  Dressing intact and device well secured.  Flushed with normal saline.  See LDA for details.  Pt advised to inform RN of any s/s of discomfort at the PIV site.    Workup / Procedure Time:  30 minutes       Care RN was notified.       Thank you,     Gillie Manners RN Venous Access Team

## 2019-12-05 MED ADMIN — OLANZapine zydis (ZyPREXA) disintegrating tablet 5 mg: 5 mg | ORAL | @ 02:00:00 | Stop: 2019-12-07

## 2019-12-05 MED ADMIN — pyridoxine (vitamin B6) (B-6) tablet 100 mg: 100 mg | ORAL | @ 13:00:00 | Stop: 2019-12-07

## 2019-12-05 MED ADMIN — thiamine (B-1) 500 mg in sodium chloride (NS) 0.9 % 100 mL IVPB: 500 mg | INTRAVENOUS | @ 11:00:00 | Stop: 2019-12-06

## 2019-12-05 MED ADMIN — fluticasone propionate (FLONASE) 50 mcg/actuation nasal spray 2 spray: 2 | NASAL | @ 13:00:00 | Stop: 2019-12-07

## 2019-12-05 MED ADMIN — folic acid (FOLVITE) tablet 1 mg: 1 mg | ORAL | @ 13:00:00 | Stop: 2019-12-07

## 2019-12-05 MED ADMIN — pantoprazole (PROTONIX) EC tablet 20 mg: 20 mg | ORAL | @ 13:00:00 | Stop: 2019-12-07

## 2019-12-05 MED ADMIN — lisinopriL (PRINIVIL,ZESTRIL) tablet 20 mg: 20 mg | ORAL | @ 13:00:00 | Stop: 2019-12-07

## 2019-12-05 MED ADMIN — magnesium oxide (MAG-OX) tablet 400 mg: 400 mg | ORAL | @ 13:00:00 | Stop: 2019-12-07

## 2019-12-05 MED ADMIN — thiamine (B-1) 500 mg in sodium chloride (NS) 0.9 % 100 mL IVPB: 500 mg | INTRAVENOUS | @ 20:00:00 | Stop: 2019-12-06

## 2019-12-05 MED ADMIN — magnesium oxide (MAG-OX) tablet 400 mg: 400 mg | ORAL | @ 02:00:00 | Stop: 2019-12-07

## 2019-12-05 MED ADMIN — diclofenac sodium (VOLTAREN) 1 % gel 2 g: 2 g | TOPICAL | @ 23:00:00 | Stop: 2019-12-07

## 2019-12-05 MED ADMIN — multivitamins, therapeutic with minerals tablet 1 tablet: 1 | ORAL | @ 13:00:00 | Stop: 2019-12-07

## 2019-12-05 MED ADMIN — thiamine (B-1) 500 mg in sodium chloride (NS) 0.9 % 100 mL IVPB: 500 mg | INTRAVENOUS | @ 02:00:00 | Stop: 2019-12-06

## 2019-12-05 MED ADMIN — diclofenac sodium (VOLTAREN) 1 % gel 2 g: 2 g | TOPICAL | @ 20:00:00 | Stop: 2019-12-07

## 2019-12-05 MED ADMIN — nicotine (NICODERM CQ) 21 mg/24 hr patch 1 patch: 1 | TRANSDERMAL | @ 13:00:00 | Stop: 2019-12-07

## 2019-12-05 MED ADMIN — diclofenac sodium (VOLTAREN) 1 % gel 2 g: 2 g | TOPICAL | @ 02:00:00 | Stop: 2019-12-07

## 2019-12-05 MED ADMIN — divalproex (DEPAKOTE) DR tablet 500 mg: 500 mg | ORAL | @ 02:00:00 | Stop: 2019-12-07

## 2019-12-05 MED ADMIN — cetirizine (ZyrTEC) tablet 10 mg: 10 mg | ORAL | @ 13:00:00 | Stop: 2019-12-07

## 2019-12-05 NOTE — Unmapped (Signed)
Patient remains with sitter at bedside. NAD. Will continue POC       Problem: Fall Injury Risk  Goal: Absence of Fall and Fall-Related Injury  Outcome: Ongoing - Unchanged  Intervention: Promote Injury-Free Environment  Recent Flowsheet Documentation  Taken 12/04/2019 2000 by Larose Kells, RN BSN  Safety Interventions: sitter at bedside     Problem: Self-Care Deficit  Goal: Improved Ability to Complete Activities of Daily Living  Outcome: Ongoing - Unchanged     Problem: Adult Inpatient Plan of Care  Goal: Plan of Care Review  Outcome: Ongoing - Unchanged  Goal: Patient-Specific Goal (Individualized)  Outcome: Ongoing - Unchanged  Goal: Absence of Hospital-Acquired Illness or Injury  Outcome: Ongoing - Unchanged  Intervention: Identify and Manage Fall Risk  Recent Flowsheet Documentation  Taken 12/04/2019 2000 by Larose Kells, RN BSN  Safety Interventions: sitter at bedside  Goal: Optimal Comfort and Wellbeing  Outcome: Ongoing - Unchanged  Goal: Readiness for Transition of Care  Outcome: Ongoing - Unchanged  Goal: Rounds/Family Conference  Outcome: Ongoing - Unchanged     Problem: Hypertension Comorbidity  Goal: Blood Pressure in Desired Range  Outcome: Ongoing - Unchanged     Problem: Pain Chronic (Persistent) (Comorbidity Management)  Goal: Acceptable Pain Control and Functional Ability  Outcome: Ongoing - Unchanged     Problem: Manic Behavior Episode  Goal: Decreased Manic Symptoms  Outcome: Ongoing - Unchanged

## 2019-12-05 NOTE — Unmapped (Addendum)
Care Management  Initial Transition Planning Assessment              General  Care Manager assessed the patient by : Telephone conversation with family  Orientation Level: Other (Comment)  Functional level prior to admission: Partially Assisted  Who provides care at home?: Family member  Level of assistance required: Walking  Reason for referral: Discharge Planning     Per H&P: Patient is a 54 y.o. male with PMHx as noted below that presents to [facility] with Bizarre behavior.SW completed assement with patient wife [name] Bisbee: [phone]) via phone. SW introduced herself and explained this SW role to patient wife. Patient wife confirmed demographics. Patient lives with his wife and two stepchildren ages 90 and 43. Patient stepdaughter stays home and provides patient with 24 hour care. Patient could barely walk prior to admissions and has a walker at home. Patient had home health (skilled nurse, pt, and ot). Pt wife stated pt and ot never showed up. Pt wife is a Runner, broadcasting/film/video and is available after 5 pm to provide pt with transportation once ready for discharge. Pt wife requested a letter from patient provider for a letter to assist patient with applying for disability. SW updated provider with pt wife request via chat.  SW requested CMA to send referral to all adult psych units. SW will continue to follow patient and family for continued support, avoidable delays, discharge planning and opportunities for progression of care.      Contact/Decision Maker  Extended Emergency Contact Information  Primary Emergency Contact: Bisbee,[name]  Address: [address]           [place], [place] [address] [place]  Home Phone: [phone]  Mobile Phone: [phone]  Relation: Spouse  Preferred language: ENGLISH  Interpreter needed? No    Legal Next of Kin / Guardian / POA / Advance Directives     Advance Directive (Medical Treatment)  Does patient have an advance directive covering medical treatment?: Patient does not have advance directive covering medical treatment.    Health Care Decision Maker [HCDM] (Medical & Mental Health Treatment)  Healthcare Decision Maker: Patient needs follow-up to appoint a Health Care Decision Maker.  Information offered on HCDM, Medical & Mental Health advance directives:: Patient declined information.     Patient Information  Lives with: Spouse/significant other,Children    Type of Residence: Private residence    Support Systems/Concerns: Spouse,Children    Responsibilities/Dependents at home?: Yes (Describe)    Home Care services in place prior to admission?: Yes  Type of Home Care services in place prior to admission: Home PT,Home OT    Equipment Currently Used at Home: walker, standard    Currently receiving outpatient dialysis?: No    Financial Information    Need for financial assistance?: No    Social Determinants of Health  Social Determinants of Health were addressed in provider documentation.  Please refer to patient history.    Discharge Needs Assessment  Concerns to be Addressed:      Clinical Risk Factors:      Barriers to taking medications: No    Prior overnight hospital stay or ED visit in last 90 days: No    Readmission Within the Last 30 Days: no previous admission in last 30 days    Discharge Facility/Level of Care Needs:      Readmission  Risk of Unplanned Readmission Score: UNPLANNED READMISSION SCORE: 16%  Predictive Model Details  16% (Medium)  Factor Value    Calculated 12/05/2019 12:03 17% Number of active Rx orders 32    [place] Risk of Unplanned Readmission Model 15% Active NSAID Rx order present     13% Number of ED visits in last six months 3     11% Number of hospitalizations in last year 3     7% Active antipsychotic Rx order present     7% ECG/EKG order present in last 6 months     6% Encounter of ten days or longer in last year present     6% Diagnosis of electrolyte disorder present     5% Imaging order present in last 6 months     4% Current length of stay 4.809 days     4% Diagnosis of deficiency anemia present     3% Age 68     2% Future appointment scheduled     1% Active ulcer medication Rx order present      Readmitted Within the Last 30 Days? (No if blank) Yes  Patient at risk for readmission?: No    Discharge Plan  Screen findings are: Care Manager reviewed the plan of the patient's care with the Multidisciplinary Team. No discharge planning needs identified at this time. Care Manager will continue to manage plan and monitor patient's progress with the team.    Expected Discharge Date: 12/05/2019    Expected Transfer from Critical Care:      Quality data for continuing care services shared with patient and/or representative?: No  Patient and/or family were provided with choice of facilities / services that are available and appropriate to meet post hospital care needs?: N/A       Initial Assessment complete?: Yes
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
## 2019-12-05 NOTE — Unmapped (Signed)
Pt VSS. Pt took all medication without any problems. Pt complained of pain and received pain medication. PNA at bedside. Bed in low, locked position. No acute changes will continue to monitor.  Problem: Adult Inpatient Plan of Care  Goal: Plan of Care Review  Outcome: Ongoing - Unchanged     Problem: Adult Inpatient Plan of Care  Goal: Patient-Specific Goal (Individualized)  Outcome: Ongoing - Unchanged     Problem: Adult Inpatient Plan of Care  Goal: Absence of Hospital-Acquired Illness or Injury  Outcome: Ongoing - Unchanged     Problem: Adult Inpatient Plan of Care  Goal: Optimal Comfort and Wellbeing  Outcome: Ongoing - Unchanged
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
## 2019-12-06 MED ADMIN — pantoprazole (PROTONIX) EC tablet 20 mg: 20 mg | ORAL | @ 14:00:00 | Stop: 2019-12-07

## 2019-12-06 MED ADMIN — lisinopriL (PRINIVIL,ZESTRIL) tablet 20 mg: 20 mg | ORAL | @ 14:00:00 | Stop: 2019-12-07

## 2019-12-06 MED ADMIN — acetaminophen (TYLENOL) tablet 650 mg: 650 mg | ORAL | @ 15:00:00 | Stop: 2019-12-07

## 2019-12-06 MED ADMIN — diclofenac sodium (VOLTAREN) 1 % gel 2 g: 2 g | TOPICAL | @ 10:00:00 | Stop: 2019-12-07

## 2019-12-06 MED ADMIN — ibuprofen (MOTRIN) tablet 400 mg: 400 mg | ORAL | @ 14:00:00 | Stop: 2019-12-07

## 2019-12-06 MED ADMIN — thiamine (B-1) 500 mg in sodium chloride (NS) 0.9 % 100 mL IVPB: 500 mg | INTRAVENOUS | @ 10:00:00 | Stop: 2019-12-06

## 2019-12-06 MED ADMIN — cetirizine (ZyrTEC) tablet 10 mg: 10 mg | ORAL | @ 14:00:00 | Stop: 2019-12-07

## 2019-12-06 MED ADMIN — nicotine (NICODERM CQ) 21 mg/24 hr patch 1 patch: 1 | TRANSDERMAL | @ 14:00:00 | Stop: 2019-12-07

## 2019-12-06 MED ADMIN — thiamine (B-1) 500 mg in sodium chloride (NS) 0.9 % 100 mL IVPB: 500 mg | INTRAVENOUS | @ 02:00:00 | Stop: 2019-12-06

## 2019-12-06 MED ADMIN — divalproex (DEPAKOTE) DR tablet 500 mg: 500 mg | ORAL | @ 02:00:00 | Stop: 2019-12-07

## 2019-12-06 MED ADMIN — aluminum-magnesium hydroxide-simethicone (MAALOX MAX) 80-80-8 mg/mL oral suspension: 30 mL | ORAL | @ 19:00:00 | Stop: 2019-12-07

## 2019-12-06 MED ADMIN — magnesium oxide (MAG-OX) tablet 400 mg: 400 mg | ORAL | @ 02:00:00 | Stop: 2019-12-07

## 2019-12-06 MED ADMIN — fluticasone propionate (FLONASE) 50 mcg/actuation nasal spray 2 spray: 2 | NASAL | @ 14:00:00 | Stop: 2019-12-07

## 2019-12-06 MED ADMIN — OLANZapine zydis (ZyPREXA) disintegrating tablet 5 mg: 5 mg | ORAL | @ 02:00:00 | Stop: 2019-12-07

## 2019-12-06 MED ADMIN — diclofenac sodium (VOLTAREN) 1 % gel 2 g: 2 g | TOPICAL | @ 16:00:00 | Stop: 2019-12-07

## 2019-12-06 MED ADMIN — acetaminophen (TYLENOL) tablet 650 mg: 650 mg | ORAL | @ 06:00:00 | Stop: 2019-12-07

## 2019-12-06 MED ADMIN — multivitamins, therapeutic with minerals tablet 1 tablet: 1 | ORAL | @ 14:00:00 | Stop: 2019-12-07

## 2019-12-06 MED ADMIN — diclofenac sodium (VOLTAREN) 1 % gel 2 g: 2 g | TOPICAL | @ 21:00:00 | Stop: 2019-12-07

## 2019-12-06 MED ADMIN — ibuprofen (MOTRIN) tablet 400 mg: 400 mg | ORAL | @ 21:00:00 | Stop: 2019-12-07

## 2019-12-06 MED ADMIN — thiamine (B-1) 500 mg in sodium chloride (NS) 0.9 % 100 mL IVPB: 500 mg | INTRAVENOUS | @ 18:00:00 | Stop: 2019-12-06

## 2019-12-06 MED ADMIN — folic acid (FOLVITE) tablet 1 mg: 1 mg | ORAL | @ 14:00:00 | Stop: 2019-12-07

## 2019-12-06 MED ADMIN — ibuprofen (MOTRIN) tablet 400 mg: 400 mg | ORAL | @ 05:00:00 | Stop: 2019-12-07

## 2019-12-06 MED ADMIN — diclofenac sodium (VOLTAREN) 1 % gel 2 g: 2 g | TOPICAL | @ 02:00:00 | Stop: 2019-12-07

## 2019-12-06 MED ADMIN — magnesium oxide (MAG-OX) tablet 400 mg: 400 mg | ORAL | @ 14:00:00 | Stop: 2019-12-07

## 2019-12-06 MED ADMIN — pyridoxine (vitamin B6) (B-6) tablet 100 mg: 100 mg | ORAL | @ 14:00:00 | Stop: 2019-12-07

## 2019-12-06 MED ADMIN — senna (SENOKOT) tablet 2 tablet: 2 | ORAL | @ 15:00:00 | Stop: 2019-12-07

## 2019-12-06 NOTE — Unmapped (Signed)
Pt VSS. Pt took all medication without any problems. PNA at bedside. Bed in low, locked position. No acute changes will continue to monitor.  Problem: Adult Inpatient Plan of Care  Goal: Patient-Specific Goal (Individualized)  Outcome: Ongoing - Unchanged     Problem: Adult Inpatient Plan of Care  Goal: Absence of Hospital-Acquired Illness or Injury  Outcome: Ongoing - Unchanged     Problem: Adult Inpatient Plan of Care  Goal: Optimal Comfort and Wellbeing  Outcome: Ongoing - Unchanged     Problem: Adult Inpatient Plan of Care  Goal: Readiness for Transition of Care  Outcome: Ongoing - Unchanged
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
## 2019-12-06 NOTE — Unmapped (Signed)
Patient remains on RA. VSS. Sitter at bedside. C/o back pain this shift improved with tylenol. Patient continues to be hyper- talkative. Will continue POC         Problem: Fall Injury Risk  Goal: Absence of Fall and Fall-Related Injury  Outcome: Ongoing - Unchanged  Intervention: Promote Injury-Free Environment  Recent Flowsheet Documentation  Taken 12/05/2019 2000 by Larose Kells, RN BSN  Safety Interventions: sitter at bedside     Problem: Self-Care Deficit  Goal: Improved Ability to Complete Activities of Daily Living  Outcome: Ongoing - Unchanged     Problem: Adult Inpatient Plan of Care  Goal: Plan of Care Review  Outcome: Ongoing - Unchanged  Goal: Patient-Specific Goal (Individualized)  Outcome: Ongoing - Unchanged  Goal: Absence of Hospital-Acquired Illness or Injury  Outcome: Ongoing - Unchanged  Intervention: Identify and Manage Fall Risk  Recent Flowsheet Documentation  Taken 12/05/2019 2000 by Larose Kells, RN BSN  Safety Interventions: sitter at bedside  Goal: Optimal Comfort and Wellbeing  Outcome: Ongoing - Unchanged  Goal: Readiness for Transition of Care  Outcome: Ongoing - Unchanged  Goal: Rounds/Family Conference  Outcome: Ongoing - Unchanged     Problem: Hypertension Comorbidity  Goal: Blood Pressure in Desired Range  Outcome: Ongoing - Unchanged     Problem: Pain Chronic (Persistent) (Comorbidity Management)  Goal: Acceptable Pain Control and Functional Ability  Outcome: Ongoing - Unchanged     Problem: Manic Behavior Episode  Goal: Decreased Manic Symptoms  Outcome: Ongoing - Unchanged
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
## 2019-12-07 LAB — VALPROIC ACID LEVEL, TOTAL: VALPROIC ACID TOTAL: 31.6 ug/mL — ABNORMAL LOW (ref 50.0–100.0)

## 2019-12-07 MED ORDER — NALTREXONE 50 MG TABLET
ORAL_TABLET | Freq: Every day | ORAL | 0 refills | 30.00000 days | Status: CP
Start: 2019-12-07 — End: 2019-12-11
  Filled 2019-12-07: qty 30, 30d supply, fill #0

## 2019-12-07 MED ORDER — FOLIC ACID 1 MG TABLET
ORAL_TABLET | Freq: Every day | ORAL | 0 refills | 30.00000 days | Status: CP
Start: 2019-12-07 — End: 2020-03-08
  Filled 2019-12-07: qty 30, 30d supply, fill #0

## 2019-12-07 MED ORDER — DIVALPROEX 500 MG TABLET,DELAYED RELEASE
ORAL_TABLET | Freq: Every evening | ORAL | 0 refills | 30.00000 days | Status: CP
Start: 2019-12-07 — End: 2019-12-11
  Filled 2019-12-07: qty 60, 30d supply, fill #0

## 2019-12-07 MED ORDER — OLANZAPINE 5 MG TABLET
ORAL_TABLET | Freq: Every evening | ORAL | 0 refills | 30.00000 days | Status: CP
Start: 2019-12-07 — End: 2020-01-06
  Filled 2019-12-07: qty 30, 30d supply, fill #0

## 2019-12-07 MED ADMIN — magnesium oxide (MAG-OX) tablet 400 mg: 400 mg | ORAL | @ 02:00:00 | Stop: 2019-12-07

## 2019-12-07 MED ADMIN — pantoprazole (PROTONIX) EC tablet 20 mg: 20 mg | ORAL | @ 14:00:00 | Stop: 2019-12-07

## 2019-12-07 MED ADMIN — acetaminophen (TYLENOL) tablet 650 mg: 650 mg | ORAL | @ 14:00:00 | Stop: 2019-12-07

## 2019-12-07 MED ADMIN — magnesium oxide (MAG-OX) tablet 400 mg: 400 mg | ORAL | @ 14:00:00 | Stop: 2019-12-07

## 2019-12-07 MED ADMIN — folic acid (FOLVITE) tablet 1 mg: 1 mg | ORAL | @ 14:00:00 | Stop: 2019-12-07

## 2019-12-07 MED ADMIN — lisinopriL (PRINIVIL,ZESTRIL) tablet 20 mg: 20 mg | ORAL | @ 14:00:00 | Stop: 2019-12-07

## 2019-12-07 MED ADMIN — aluminum-magnesium hydroxide-simethicone (MAALOX MAX) 80-80-8 mg/mL oral suspension: 30 mL | ORAL | @ 20:00:00 | Stop: 2019-12-07

## 2019-12-07 MED ADMIN — OLANZapine zydis (ZyPREXA) disintegrating tablet 5 mg: 5 mg | ORAL | @ 02:00:00 | Stop: 2019-12-07

## 2019-12-07 MED ADMIN — cetirizine (ZyrTEC) tablet 10 mg: 10 mg | ORAL | @ 14:00:00 | Stop: 2019-12-07

## 2019-12-07 MED ADMIN — fluticasone propionate (FLONASE) 50 mcg/actuation nasal spray 2 spray: 2 | NASAL | @ 14:00:00 | Stop: 2019-12-07

## 2019-12-07 MED ADMIN — aluminum-magnesium hydroxide-simethicone (MAALOX MAX) 80-80-8 mg/mL oral suspension: 30 mL | ORAL | @ 02:00:00 | Stop: 2019-12-07

## 2019-12-07 MED ADMIN — pyridoxine (vitamin B6) (B-6) tablet 100 mg: 100 mg | ORAL | @ 14:00:00 | Stop: 2019-12-07

## 2019-12-07 MED ADMIN — diclofenac sodium (VOLTAREN) 1 % gel 2 g: 2 g | TOPICAL | @ 21:00:00 | Stop: 2019-12-07

## 2019-12-07 MED ADMIN — diclofenac sodium (VOLTAREN) 1 % gel 2 g: 2 g | TOPICAL | @ 11:00:00 | Stop: 2019-12-07

## 2019-12-07 MED ADMIN — multivitamins, therapeutic with minerals tablet 1 tablet: 1 | ORAL | @ 14:00:00 | Stop: 2019-12-07

## 2019-12-07 MED ADMIN — divalproex (DEPAKOTE) DR tablet 500 mg: 500 mg | ORAL | @ 02:00:00 | Stop: 2019-12-07

## 2019-12-07 MED ADMIN — thiamine (B-1) 500 mg in sodium chloride (NS) 0.9 % 100 mL IVPB: 500 mg | INTRAVENOUS | @ 02:00:00 | Stop: 2019-12-06

## 2019-12-07 MED ADMIN — acetaminophen (TYLENOL) tablet 650 mg: 650 mg | ORAL | @ 22:00:00 | Stop: 2019-12-07

## 2019-12-07 MED ADMIN — acetaminophen (TYLENOL) tablet 650 mg: 650 mg | ORAL | @ 02:00:00 | Stop: 2019-12-07

## 2019-12-07 MED ADMIN — diclofenac sodium (VOLTAREN) 1 % gel 2 g: 2 g | TOPICAL | @ 17:00:00 | Stop: 2019-12-07

## 2019-12-07 MED ADMIN — ibuprofen (MOTRIN) tablet 400 mg: 400 mg | ORAL | @ 11:00:00 | Stop: 2019-12-07

## 2019-12-07 MED ADMIN — diclofenac sodium (VOLTAREN) 1 % gel 2 g: 2 g | TOPICAL | @ 02:00:00 | Stop: 2019-12-07

## 2019-12-07 MED ADMIN — nicotine (NICODERM CQ) 21 mg/24 hr patch 1 patch: 1 | TRANSDERMAL | @ 14:00:00 | Stop: 2019-12-07

## 2019-12-07 MED FILL — NALTREXONE 50 MG TABLET: 30 days supply | Qty: 30 | Fill #0 | Status: AC

## 2019-12-07 MED FILL — DIVALPROEX 500 MG TABLET,DELAYED RELEASE: 30 days supply | Qty: 60 | Fill #0 | Status: AC

## 2019-12-07 MED FILL — OLANZAPINE 5 MG TABLET: 30 days supply | Qty: 30 | Fill #0 | Status: AC

## 2019-12-07 MED FILL — FOLIC ACID 1 MG TABLET: 30 days supply | Qty: 30 | Fill #0 | Status: AC

## 2019-12-07 NOTE — Unmapped (Signed)
Patient is alert and oriented x4.  He is hyperverbal but redirectable.  Maintained on room air with no signs of respiratory distress.  Meds given per MAR.  1:1 sitter maintained.  No falls during shift.  Pt's wife called and spoke with nurse about concerns on pt  Discharging too soon.  MD notified.  Bed in lowest position and call bell within reach.  No further concerns.  Will continue POC.  Estimated discharge date 12/07/19.  Problem: Adult Inpatient Plan of Care  Goal: Plan of Care Review  Outcome: Ongoing - Unchanged     Problem: Adult Inpatient Plan of Care  Goal: Optimal Comfort and Wellbeing  Outcome: Ongoing - Unchanged     Problem: Adult Inpatient Plan of Care  Goal: Patient-Specific Goal (Individualized) - pt will rest during shift  Outcome: Ongoing - Unchanged
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
## 2019-12-08 DIAGNOSIS — E871 Hypo-osmolality and hyponatremia: Principal | ICD-10-CM

## 2019-12-08 DIAGNOSIS — D539 Nutritional anemia, unspecified: Principal | ICD-10-CM

## 2019-12-08 DIAGNOSIS — F411 Generalized anxiety disorder: Principal | ICD-10-CM

## 2019-12-08 DIAGNOSIS — R462 Strange and inexplicable behavior: Principal | ICD-10-CM

## 2019-12-08 DIAGNOSIS — G40909 Epilepsy, unspecified, not intractable, without status epilepticus: Principal | ICD-10-CM

## 2019-12-08 DIAGNOSIS — I10 Essential (primary) hypertension: Principal | ICD-10-CM

## 2019-12-08 DIAGNOSIS — L4050 Arthropathic psoriasis, unspecified: Principal | ICD-10-CM

## 2019-12-08 DIAGNOSIS — F10280 Alcohol dependence with alcohol-induced anxiety disorder: Principal | ICD-10-CM

## 2019-12-08 DIAGNOSIS — F17210 Nicotine dependence, cigarettes, uncomplicated: Principal | ICD-10-CM

## 2019-12-08 DIAGNOSIS — F32A Depression, unspecified: Principal | ICD-10-CM

## 2019-12-08 LAB — METHYLMALONIC ACID, SERUM: METHYLMALONIC ACID: 0.09 nmol/mL

## 2019-12-08 NOTE — Unmapped (Signed)
Talked to patient & told him Dr. [name] didn't have an earlier appt. He was fine with that

## 2019-12-08 NOTE — Unmapped (Signed)
Patient left message requesting an earlier appointment

## 2019-12-10 ENCOUNTER — Encounter: Admit: 2019-12-10 | Discharge: 2019-12-12 | Payer: MEDICAID

## 2019-12-10 ENCOUNTER — Inpatient Hospital Stay: Admit: 2019-12-10 | Discharge: 2019-12-12 | Payer: MEDICAID

## 2019-12-10 DIAGNOSIS — F32A Depression, unspecified: Principal | ICD-10-CM

## 2019-12-10 DIAGNOSIS — L4050 Arthropathic psoriasis, unspecified: Principal | ICD-10-CM

## 2019-12-10 DIAGNOSIS — D539 Nutritional anemia, unspecified: Principal | ICD-10-CM

## 2019-12-10 DIAGNOSIS — G40909 Epilepsy, unspecified, not intractable, without status epilepticus: Principal | ICD-10-CM

## 2019-12-10 DIAGNOSIS — E871 Hypo-osmolality and hyponatremia: Principal | ICD-10-CM

## 2019-12-10 DIAGNOSIS — I10 Essential (primary) hypertension: Principal | ICD-10-CM

## 2019-12-10 DIAGNOSIS — R462 Strange and inexplicable behavior: Principal | ICD-10-CM

## 2019-12-10 DIAGNOSIS — F10280 Alcohol dependence with alcohol-induced anxiety disorder: Principal | ICD-10-CM

## 2019-12-10 DIAGNOSIS — F411 Generalized anxiety disorder: Principal | ICD-10-CM

## 2019-12-10 DIAGNOSIS — F17210 Nicotine dependence, cigarettes, uncomplicated: Principal | ICD-10-CM

## 2019-12-11 ENCOUNTER — Ambulatory Visit: Admit: 2019-12-11 | Discharge: 2019-12-12 | Payer: MEDICAID | Attending: Psychiatry | Primary: Psychiatry

## 2019-12-11 DIAGNOSIS — F319 Bipolar disorder, unspecified: Principal | ICD-10-CM

## 2019-12-11 DIAGNOSIS — F1020 Alcohol dependence, uncomplicated: Principal | ICD-10-CM

## 2019-12-11 MED ORDER — NALTREXONE 50 MG TABLET
ORAL_TABLET | Freq: Every day | ORAL | 0 refills | 30.00000 days | Status: CP
Start: 2019-12-11 — End: 2020-12-10

## 2019-12-11 MED ORDER — OLANZAPINE 10 MG TABLET
ORAL_TABLET | Freq: Every evening | ORAL | 0 refills | 90 days | Status: CP
Start: 2019-12-11 — End: 2020-02-19

## 2019-12-11 MED ORDER — DIVALPROEX 500 MG TABLET,DELAYED RELEASE
ORAL_TABLET | Freq: Every evening | ORAL | 0 refills | 30.00000 days | Status: CP
Start: 2019-12-11 — End: 2020-02-19

## 2019-12-11 NOTE — Unmapped (Signed)
Account Number [id number]  Account Name [facility]  Call Id [id number]  Call Start Time   12/10/2019 03:04:53 PM PT    (12/10/2019 06:04:53 PM ET)  Call End Time   12/10/2019 04:02:49 PM PT    (12/10/2019 07:02:49 PM ET)  Call Duration 00:57:55  Total Handle Time 01:05:50  Finalized Time   12/10/2019 04:13:36 PM PT    (12/10/2019 07:13:36 PM ET)  Finalized Call Taker Edwena Blow, LMSW  Responsible Call Taker Edwena Blow, LMSW  Level of Care Urgent  Call Type Counseling Request  (Person Information)  Caller [name]  Caller Alternate Name   Caller Primary Phone [phone]  Is it ok to leave a message : Yes  Caller Secondary Phone [phone]  Is it ok to leave a message : Yes  Is Caller the POC No  Person of Concern Nicholas Rush  Person of Concern Alternate Name   Person of Concern Primary Phone [phone]  Is it ok to leave a message : No  New Call  Insurance coverage?   Medicaid  -[facility]  -[place]   Is the [employer] or Person of Concern part of the [employer] program?   No  Service Request Type   For Another Adult  COVID  Is this call related to concerns about the coronavirus/COVID-19?   No  3Rd Party Information  Caller's relationship to that person Wife  Gender of that person   Male  -Date of birth? Birthdate: January 30, 1965  Age: 1  Insurance coverage?   Medicaid  Currently enrolled in services with [facility]?   Yes  Current therapist or case manager?   Yes. Provider name is (below):  Current therapist or case manager? 1st appointment with Rogers Blocker scheduled for tomorrow  -3rd Party's Address: [address]  [place], [place]  [address]  Irena Cords States  Clinical Assessment 2.2  Primary Reason for Call   Cognitive Concerns  Assessed Initial Level of Distress   Mild  Overview [name] is seeking immediate support for Kyran's cognitive concerns (mania) precipitated by his recent hospitalization and his purchasing 57 items on Amazon last night as well as his trying to buy a car last night.  Assessed Presentation   Unremarkable  Other Presentations (describe as relevant)  Additional Presentation Details CONCERNED  Suicide Risk Assessment   Assessment indicates no risk related to suicide  Buffers and Resiliency Factors   Engagement with Phone Counselor  Intentional Self-Injury Risk Assessment   Assessment indicates no risk related to self-injury  Threat of Violence Risk Assessment   Assessment indicates no risk related to harm to others  Substance Use Risk Assessment   Current or recent substance use*  Narrative Substance Use Risk Assessment TYPE OF SUBSTANCE(S): Alcohol FREQUENCY: Daily AMOUNT USED: 25 airplane bottles a day DURATION OF USE: Thirty years LAST USE: September 27th CURRENT SYMPTOMS: None noted.  Interpersonal Violence Risk Assessment   Assessment indicates no risk related to interpersonal violence  Abuse-related Risk Assessment   Not Relevant  Psychiatric Medication Assessment   Confirms taking listed medication(s) as prescribed *  Narrative Psychiatric Medication Assessment PSYCHIATRIC MEDICATION NAME(S): Depakote and Zyprexa, Naltrexone DESCRIBE CONCERNS/RISK, IF ANY: His medication was doubled and [name] wonders if it might be contributing to his mania. We discussed the importance of going to the hospital should the symptoms grow worse. [name] will have [name] follow up with his doctor tomorrow at his appointment at 8:15 AM.  Interventions   Provided empathic listening and validation.  Spoke directly to the Person of Concern.  Encouraged caller to seek face-to-face services.  Encouraged caller to follow up with current provider.  Advised calling this line if future risk or urgent concerns arise.  Educated about when Emergency Services are an appropriate option.  Advised that services are available to caller 24/7.  Created self-care plan with caller.  Helped caller identify friends/family who may be source of support.  Advised seeking medical attention for serious physical symptoms.  Describe clinical justification and rationale I marked this call as Urgent given Abdur's noticeable impairment in functioning (inability to sleep) following exposure to an identifiable stressor (manic episode) which is putting at risk stabilizing factors such as his sobriety, employment, relationship, his finances, and his housing status (he is overspending in his manic state and wanting to drink alcohol again) and which could potentially lead to a further deterioration in his physical condition.  Assessed Concluding Level of Distress   Mild  Level of Care (LOC)   Urgent  Is this call related to concerns about the coronavirus/COVID-19?   No  Pick one outcome from the drop-down list. If more than one applies, choose the highest numbered item.   8. No higher level intervention needed/available.  Caller's Plan SELF CARE: Has hidden the keys to the car, changed the password so he can't purchase items on the internet tonight. RISK MITIGATION: N/A CONTINGENCY: [name] agreed to watch over her husband tonight to keep him safe. If he develops worsening symptoms, she will bring him in to the Emergency Room or call us back.  Follow-up Expectations   No follow-up expected  Screened Call

## 2019-12-11 NOTE — Unmapped (Signed)
We made the following medication changes today:  We are increasing your Zyprexa 5mg  to 10mg  every night.      Follow-up instructions:  -- Please continue taking your medications as prescribed for your mental health.   -- Do not make changes to your medications, including taking more or less than prescribed, unless under the supervision of your physician. Be aware that some medications may make you feel worse if abruptly stopped  -- Please refrain from using illicit substances, as these can affect your mood and could cause anxiety or other concerning symptoms.   -- Seek further medical care for any increase in symptoms or new symptoms such as thoughts of wanting to hurt yourself or hurt others.   -- Please follow-up 01/15/20 at 2pm at [facility] info:  Life-threatening emergencies: Call 911 or go to the nearest ER for medical or psychiatric attention.      Issues that need urgent attention but are not life threatening: Call the clinic outpatient frontdesk at [phone] for assistance.      Non-urgent routine concerns, questions, and refill requests: The best way to reach me is through a MyChart message and I Khoa Opdahl get back to you within 2 business days.      Regarding appointments:  - If you need to cancel your appointment, we ask that you call [phone] at least 24 hours before your scheduled appointment.  - If for any reason you arrive 15 minutes later than your scheduled appointment time, you may not be seen and your visit may be rescheduled.  - Please remember that we Nicklous Aburto not automatically reschedule missed appointments.  - If you miss two (2) appointments without letting us know in advance, you Kimley Apsey likely be referred to a provider in your community.  - We Ransome Helwig do our best to be on time. Sometimes an emergency Kourtlyn Charlet arise that might cause your clinician to be late. We Obdulia Steier try to inform you of this when you check in for your appointment. If you wait more than 15 minutes past your appointment time without such notice, please speak with the front desk staff.     In the event of bad weather, the clinic staff Donyale Berthold attempt to contact you, should your appointment need to be rescheduled. Additionally, you can call the Patient Weather Line [phone] for system-wide clinic status     For more information and reminders regarding clinic policies (these were provided when you were admitted to the clinic), please ask the front desk.
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
## 2019-12-12 DIAGNOSIS — F17210 Nicotine dependence, cigarettes, uncomplicated: Principal | ICD-10-CM

## 2019-12-12 DIAGNOSIS — F411 Generalized anxiety disorder: Principal | ICD-10-CM

## 2019-12-12 DIAGNOSIS — L4050 Arthropathic psoriasis, unspecified: Principal | ICD-10-CM

## 2019-12-12 DIAGNOSIS — I10 Essential (primary) hypertension: Principal | ICD-10-CM

## 2019-12-12 DIAGNOSIS — F10280 Alcohol dependence with alcohol-induced anxiety disorder: Principal | ICD-10-CM

## 2019-12-12 DIAGNOSIS — G40909 Epilepsy, unspecified, not intractable, without status epilepticus: Principal | ICD-10-CM

## 2019-12-12 DIAGNOSIS — F32A Depression, unspecified: Principal | ICD-10-CM

## 2019-12-12 DIAGNOSIS — R462 Strange and inexplicable behavior: Principal | ICD-10-CM

## 2019-12-12 DIAGNOSIS — D539 Nutritional anemia, unspecified: Principal | ICD-10-CM

## 2019-12-12 DIAGNOSIS — E871 Hypo-osmolality and hyponatremia: Principal | ICD-10-CM

## 2019-12-12 NOTE — Unmapped (Signed)
I discussed this case with the resident. I agree with the assessment and plan as documented in the resident's note.

## 2019-12-13 ENCOUNTER — Ambulatory Visit: Admit: 2019-12-13 | Discharge: 2019-12-14 | Payer: MEDICAID | Attending: Pediatrics | Primary: Pediatrics

## 2019-12-13 DIAGNOSIS — R569 Unspecified convulsions: Principal | ICD-10-CM

## 2019-12-13 DIAGNOSIS — I10 Essential (primary) hypertension: Principal | ICD-10-CM

## 2019-12-13 DIAGNOSIS — F319 Bipolar disorder, unspecified: Principal | ICD-10-CM

## 2019-12-13 DIAGNOSIS — F10231 Alcohol dependence with withdrawal delirium: Principal | ICD-10-CM

## 2019-12-13 DIAGNOSIS — F1020 Alcohol dependence, uncomplicated: Principal | ICD-10-CM

## 2019-12-13 LAB — CBC W/ AUTO DIFF
BASOPHILS ABSOLUTE COUNT: 0 10*9/L (ref 0.0–0.1)
BASOPHILS RELATIVE PERCENT: 0.4 %
EOSINOPHILS ABSOLUTE COUNT: 0.3 10*9/L (ref 0.0–0.4)
EOSINOPHILS RELATIVE PERCENT: 3 %
HEMATOCRIT: 36.2 % — ABNORMAL LOW (ref 41.0–53.0)
HEMOGLOBIN: 11.7 g/dL — ABNORMAL LOW (ref 13.5–17.5)
LARGE UNSTAINED CELLS: 2 % (ref 0–4)
LYMPHOCYTES ABSOLUTE COUNT: 2.2 10*9/L (ref 1.5–5.0)
LYMPHOCYTES RELATIVE PERCENT: 27.2 %
MEAN CORPUSCULAR HEMOGLOBIN CONC: 32.4 g/dL (ref 31.0–37.0)
MEAN CORPUSCULAR HEMOGLOBIN: 34.8 pg — ABNORMAL HIGH (ref 26.0–34.0)
MEAN CORPUSCULAR VOLUME: 107.3 fL — ABNORMAL HIGH (ref 80.0–100.0)
MEAN PLATELET VOLUME: 9.6 fL (ref 7.0–10.0)
MONOCYTES ABSOLUTE COUNT: 0.7 10*9/L (ref 0.2–0.8)
MONOCYTES RELATIVE PERCENT: 7.9 %
NEUTROPHILS ABSOLUTE COUNT: 4.9 10*9/L (ref 2.0–7.5)
NEUTROPHILS RELATIVE PERCENT: 59.7 %
PLATELET COUNT: 330 10*9/L (ref 150–440)
RED BLOOD CELL COUNT: 3.37 10*12/L — ABNORMAL LOW (ref 4.50–5.90)
RED CELL DISTRIBUTION WIDTH: 13.8 % (ref 12.0–15.0)
WBC ADJUSTED: 8.3 10*9/L (ref 4.5–11.0)

## 2019-12-13 LAB — COMPREHENSIVE METABOLIC PANEL
ALBUMIN: 3.8 g/dL (ref 3.4–5.0)
ALKALINE PHOSPHATASE: 74 U/L (ref 46–116)
ALT (SGPT): 14 U/L (ref 10–49)
ANION GAP: 8 mmol/L (ref 5–14)
AST (SGOT): 17 U/L (ref <=34)
BILIRUBIN TOTAL: 0.4 mg/dL (ref 0.3–1.2)
BLOOD UREA NITROGEN: 24 mg/dL — ABNORMAL HIGH (ref 9–23)
BUN / CREAT RATIO: 17
CALCIUM: 10 mg/dL (ref 8.7–10.4)
CHLORIDE: 98 mmol/L (ref 98–107)
CO2: 24 mmol/L (ref 20.0–31.0)
CREATININE: 1.44 mg/dL — ABNORMAL HIGH
EGFR CKD-EPI AA MALE: 63 mL/min/{1.73_m2} (ref >=60)
EGFR CKD-EPI NON-AA MALE: 54 mL/min/{1.73_m2} — ABNORMAL LOW (ref >=60)
GLUCOSE RANDOM: 99 mg/dL (ref 70–99)
POTASSIUM: 5.1 mmol/L — ABNORMAL HIGH (ref 3.4–4.5)
PROTEIN TOTAL: 7.7 g/dL (ref 5.7–8.2)
SODIUM: 130 mmol/L — ABNORMAL LOW (ref 135–145)

## 2019-12-13 LAB — VALPROIC ACID LEVEL, TOTAL: VALPROIC ACID TOTAL: 65 ug/mL (ref 50.0–100.0)

## 2019-12-13 LAB — SLIDE REVIEW

## 2019-12-13 NOTE — Unmapped (Signed)
Patient called reguarding his appointment for tomorrow if he needs a driver or not about getting his license .    Per Dr [name], should bring a driver.

## 2019-12-13 NOTE — Unmapped (Signed)
Continue with depakote    Check levels/ CMP

## 2019-12-13 NOTE — Unmapped (Addendum)
Patient called and wanted to make sure you have the fax number for the medical review board. [phone] said its crucial that the paper work get sent asap.     Patient is not fit to drive and still having some episodes of inappropriate thinking

## 2019-12-14 NOTE — Unmapped (Signed)
Assessment/Plan:        Diagnoses and all orders for this visit:    Alcoholism (CMS-HCC)  Patient with history of alcoholism and reports that he has been abstinent now for several months and is taking naltrexone.  We will check a comprehensive panel as well as CBC with differential as he has had history of pancytopenia.  -     Comprehensive Metabolic Panel; Future  -     CBC w/ Differential; Future    Alcohol withdrawal seizure with delirium (CMS-HCC)  Patient with previous history of alcohol withdrawal seizure  -     Order to schedule follow-up visit with primary care physician    Essential hypertension  Essential hypertension which has been under excellent control patient is currently taking lisinopril and is having no episodes of hypotension reports home blood pressure measurements are at target    Bipolar 1 disorder (CMS-HCC)  Recent admission for bizarre behavior and increased agitation was suggestive of bipolar 1 patient also has history of alcoholism with potential for alcohol induced dementia.  He has been started on Depakote and has not had levels or any laboratory follow-up we will check valproic acid as well as CBC and CHEM profile.  He has had follow-up with psychiatry.  He reports compliance with medication but is still floridly manic with no hallucinations.  -     Valproic Acid Level; Future  -     CBC w/ Differential; Future  -     Cancel: Valproic acid level, total          Subjective:     Patient ID: Nicholas Rush is a 54 y.o. male.    HPI 54 year old gentleman with history of alcoholism, hypertension, recent hospital admission for bizarre behavior and diagnosis of bipolar disorder with mania versus alcoholic brain dysfunction.  He has been started on Depakote as well as naltrexone and reports abstinence from alcohol.  He does report some cravings but no other symptoms and he is also been monitoring his blood pressure which has been at target.  Patient was recently discharged from the hospital with psychiatric follow-up.  He reports that despite orders they could not draw his blood to check levels.  He denies any side effects from his current medication of Depakote he reports that he has been compliant with this.  Patient denies dizziness, nausea, vomiting, diarrhea he reports that he has a good appetite    The following portions of the patient's history were reviewed and updated as appropriate: allergies, current medications, past family history, past medical history, past social history, past surgical history and problem list.      Review of Systems  ROS:  Constitutional: denies fever,chills, weight loss.   Vision: Denies double vision, eye pain, blindness  Head: Denies HAs  Resp: Denies cough,SOB, DOE or wheeze  CV: Denies CP/ SOB, LE edema or palpitation  GI: Denies ABD pain, distention, Nausea, vomiting or diarrhea  GU: Denies flank pain, dysuria, hematuria  MSK: Denies myalgia, arthralgias or joint swelling  Integument: Denies rash, pallor, bruising  Neuro: Denies seizures, Headaches, dizziness, speech difficulty  Psychiatry:Denies depressive sx, anxiety, SI or HI  Endocrine: Denies polyuria/polydipsia/ heat or cold intolerance        Objective:    Physical Exam      Physical Exam:  GENERAL APPEARANCE: No acute distress.   HEENT: Sclerae are clear. TMs are normal. Nares are clear. Oropharynx is clear.   NECK: Supple, no thyromegaly,carotid bruits or adenopathy.  CARDIOVASCULAR: Regular rate and rhythm. No significant murmur, gallop or ectopy.   LUNGS: Clear to auscultation bilaterally.  ABDOMEN: No organomegaly, masses or tenderness.   EXTREMITIES: No edema, good peripheral pulses.   LYMPHATICS: No cervical, axillary or supraclavicular lymphadenopathy noted.  SKIN: No rash.  NEUROLOGIC: CN Rush-XII intact. No focal motor or sensory deficits noted. Cerebellar function normal. Gait normal.  Psychiatric patient appears to be calm with good eye contact but does switch from subject to subject rapidly and has rapid speech pattern.  In addition he demonstrates ideas of grandiosity but denies any frank hallucinations.

## 2019-12-18 DIAGNOSIS — F10280 Alcohol dependence with alcohol-induced anxiety disorder: Principal | ICD-10-CM

## 2019-12-18 DIAGNOSIS — G40909 Epilepsy, unspecified, not intractable, without status epilepticus: Principal | ICD-10-CM

## 2019-12-18 DIAGNOSIS — L4050 Arthropathic psoriasis, unspecified: Principal | ICD-10-CM

## 2019-12-18 DIAGNOSIS — E871 Hypo-osmolality and hyponatremia: Principal | ICD-10-CM

## 2019-12-18 DIAGNOSIS — R462 Strange and inexplicable behavior: Principal | ICD-10-CM

## 2019-12-18 DIAGNOSIS — F17210 Nicotine dependence, cigarettes, uncomplicated: Principal | ICD-10-CM

## 2019-12-18 DIAGNOSIS — F32A Depression, unspecified: Principal | ICD-10-CM

## 2019-12-18 DIAGNOSIS — I10 Essential (primary) hypertension: Principal | ICD-10-CM

## 2019-12-18 DIAGNOSIS — F411 Generalized anxiety disorder: Principal | ICD-10-CM

## 2019-12-18 DIAGNOSIS — D539 Nutritional anemia, unspecified: Principal | ICD-10-CM

## 2019-12-18 NOTE — Unmapped (Signed)
[name] left message stating that the patient has recently been committed and she just wanted to update you.

## 2019-12-20 DIAGNOSIS — R462 Strange and inexplicable behavior: Principal | ICD-10-CM

## 2019-12-20 DIAGNOSIS — F32A Depression, unspecified: Principal | ICD-10-CM

## 2019-12-20 DIAGNOSIS — L4050 Arthropathic psoriasis, unspecified: Principal | ICD-10-CM

## 2019-12-20 DIAGNOSIS — D539 Nutritional anemia, unspecified: Principal | ICD-10-CM

## 2019-12-20 DIAGNOSIS — G40909 Epilepsy, unspecified, not intractable, without status epilepticus: Principal | ICD-10-CM

## 2019-12-20 DIAGNOSIS — F17210 Nicotine dependence, cigarettes, uncomplicated: Principal | ICD-10-CM

## 2019-12-20 DIAGNOSIS — E871 Hypo-osmolality and hyponatremia: Principal | ICD-10-CM

## 2019-12-20 DIAGNOSIS — F10280 Alcohol dependence with alcohol-induced anxiety disorder: Principal | ICD-10-CM

## 2019-12-20 DIAGNOSIS — I10 Essential (primary) hypertension: Principal | ICD-10-CM

## 2019-12-20 DIAGNOSIS — F411 Generalized anxiety disorder: Principal | ICD-10-CM

## 2019-12-20 NOTE — Unmapped (Signed)
[facility] Refill Coordination Note    Specialty Medication(s) to be Shipped:   Inflammatory Disorders: Humira    Other medication(s) to be shipped: n/a     Nicholas Rush, DOB: 02-04-1965  Phone: [phone] (home)       All above HIPAA information was verified with patient's girlfriend [name]     Was a Nurse, learning disability used for this call? No    Completed refill call assessment today to schedule patient's medication shipment from the [facility] [phone]).       Specialty medication(s) and dose(s) confirmed: Regimen is correct and unchanged.   Changes to medications: [name] reports no changes at this time.  Changes to insurance: No  Questions for the pharmacist: No    Confirmed patient received Welcome Packet with first shipment. The patient will receive a drug information handout for each medication shipped and additional FDA Medication Guides as required.       DISEASE/MEDICATION-SPECIFIC INFORMATION        For patients on injectable medications: Patient currently has 0 doses left.  Next injection is scheduled for unsure (pt is currently in psychiatric hospital).    SPECIALTY MEDICATION ADHERENCE     Medication Adherence    Patient reported X missed doses in the last month: 0  Specialty Medication: Humira CF 40 mg/0.4 ml   Patient is on additional specialty medications: No  Any gaps in refill history greater than 2 weeks in the last 3 months: no  Demonstrates understanding of importance of adherence: yes  Informant: significant other  Reliability of informant: reliable  Confirmed plan for next specialty medication refill: delivery by pharmacy  Refills needed for supportive medications: not needed                  SHIPPING     Shipping address confirmed in Epic.     Delivery Scheduled: Yes, Expected medication delivery date: 12/25/2019.     Medication will be delivered via Same Day Courier to the prescription address in Epic WAM.    Izetta Sakamoto D Anaalicia Reimann   [facility] Specialty Technician

## 2019-12-21 DIAGNOSIS — F411 Generalized anxiety disorder: Principal | ICD-10-CM

## 2019-12-21 DIAGNOSIS — E871 Hypo-osmolality and hyponatremia: Principal | ICD-10-CM

## 2019-12-21 DIAGNOSIS — I10 Essential (primary) hypertension: Principal | ICD-10-CM

## 2019-12-21 DIAGNOSIS — F32A Depression, unspecified: Principal | ICD-10-CM

## 2019-12-21 DIAGNOSIS — F17210 Nicotine dependence, cigarettes, uncomplicated: Principal | ICD-10-CM

## 2019-12-21 DIAGNOSIS — G40909 Epilepsy, unspecified, not intractable, without status epilepticus: Principal | ICD-10-CM

## 2019-12-21 DIAGNOSIS — F10280 Alcohol dependence with alcohol-induced anxiety disorder: Principal | ICD-10-CM

## 2019-12-21 DIAGNOSIS — R462 Strange and inexplicable behavior: Principal | ICD-10-CM

## 2019-12-21 DIAGNOSIS — D539 Nutritional anemia, unspecified: Principal | ICD-10-CM

## 2019-12-21 DIAGNOSIS — L4050 Arthropathic psoriasis, unspecified: Principal | ICD-10-CM

## 2019-12-22 NOTE — Unmapped (Signed)
Patient's spouse left message requesting a return call. He would like to be referred to a specialist.

## 2019-12-25 MED ORDER — OMEPRAZOLE 20 MG CAPSULE,DELAYED RELEASE
ORAL_CAPSULE | 0 refills | 0 days
Start: 2019-12-25 — End: ?

## 2019-12-25 MED FILL — HUMIRA SYRINGE CITRATE FREE 40 MG/0.4 ML: 28 days supply | Qty: 2 | Fill #1 | Status: AC

## 2019-12-25 MED FILL — HUMIRA SYRINGE CITRATE FREE 40 MG/0.4 ML: 28 days supply | Qty: 2 | Fill #1

## 2019-12-26 MED ORDER — OMEPRAZOLE 20 MG CAPSULE,DELAYED RELEASE
ORAL_CAPSULE | 0 refills | 0 days | Status: SS
Start: 2019-12-26 — End: ?

## 2019-12-26 NOTE — Unmapped (Signed)
Patient is requesting the following refill  Requested Prescriptions     Pending Prescriptions Disp Refills   ??? omeprazole (PRILOSEC) 20 MG capsule [Pharmacy Med Name: OMEPRAZOLE 20MG  CAPSULES] 90 capsule 0     Sig: TAKE 1 CAPSULE(20 MG) BY MOUTH EVERY DAY       Last OV: 12/13/2019    Next OV: 03/01/2020.     Labs: Not applicable this refill

## 2019-12-27 NOTE — Unmapped (Signed)
Patient's wife called stating she needs an appt next week for [facility] f/u from [employer].  I was advised to ask if you can see him 01/04/20 @10 :00.  Please advise ASAP I will call wife to schedule.

## 2019-12-27 NOTE — Unmapped (Signed)
Patient left message requesting a return call to discuss his driver's license. Patient states that is is driving today

## 2019-12-28 NOTE — Unmapped (Signed)
3rd call

## 2019-12-31 NOTE — Unmapped (Signed)
Recent:   What is the date of your last related visit?  11/24- Dr [name];  Discharged from [facility] on 12/8  Related acute medications Rx'd:  vraylar- in am;  seroquel 200 mg at hs  Home treatment tried:  none  COVID Vaccine: x2    Relevant:   Allergies: Tramadol and Hydroxyzine  Medications: depakote, seroqel- 200 mg, , humira   Health History: bipolar, htn  Weight: 195 lbs        Reason for Disposition  ??? [1] Very swollen ankle AND [2] no fever    Answer Assessment - Initial Assessment Questions  1. LOCATION: Which ankle is swollen? Where is the swelling?      [place]. Feet and ankles-up to knee- left ankle is more swollen than right; Left top of the ankle is pinker than the rest of the foot, leg.  Since 12/8- the swelling has stayed the same.    2. ONSET: When did the swelling start?      Wife noticed when she picked him up from [facility] for [facility] on 12/08  3. SIZE: How large is the swelling?      Not pitting edema-wife says hard to measure-but he cannot get his shoes on  4. PAIN: Is there any pain? If Yes, ask: How bad is it? (Scale 1-10; or mild, moderate, severe)    - NONE (0): no pain.    - MILD (1-3): doesn't interfere with normal activities.     - MODERATE (4-7): interferes with normal activities (e.g., work or school) or awakens from sleep, limping.     - SEVERE (8-10): excruciating pain, unable to do any normal activities, unable to walk.       No pain  5. CAUSE: What do you think caused the ankle swelling?      unsure  6. OTHER SYMPTOMS: Do you have any other symptoms? (e.g., fever, chest pain, difficulty breathing, calf pain)      No fever- no calf pain, no other symptoms  7. PREGNANCY: Is there any chance you are pregnant? When was your last menstrual period?      n/a    Blood pressure 12/12=108/73, pulse-72    Protocols used: ANKLE SWELLING-A-AH

## 2020-01-03 ENCOUNTER — Emergency Department
Admit: 2020-01-03 | Discharge: 2020-01-05 | Disposition: A | Discharge status: Other Acute Inpatient Hospital | Payer: MEDICAID

## 2020-01-03 ENCOUNTER — Ambulatory Visit
Admit: 2020-01-03 | Discharge: 2020-01-05 | Disposition: A | Discharge status: Other Acute Inpatient Hospital | Payer: MEDICAID

## 2020-01-03 DIAGNOSIS — F17210 Nicotine dependence, cigarettes, uncomplicated: Principal | ICD-10-CM

## 2020-01-03 DIAGNOSIS — Z79899 Other long term (current) drug therapy: Principal | ICD-10-CM

## 2020-01-03 DIAGNOSIS — F340 Cyclothymic disorder: Principal | ICD-10-CM

## 2020-01-03 DIAGNOSIS — F308 Other manic episodes: Principal | ICD-10-CM

## 2020-01-03 DIAGNOSIS — G312 Degeneration of nervous system due to alcohol: Principal | ICD-10-CM

## 2020-01-03 DIAGNOSIS — Z20822 Contact with and (suspected) exposure to covid-19: Principal | ICD-10-CM

## 2020-01-03 DIAGNOSIS — G4700 Insomnia, unspecified: Principal | ICD-10-CM

## 2020-01-03 DIAGNOSIS — F418 Other specified anxiety disorders: Principal | ICD-10-CM

## 2020-01-03 DIAGNOSIS — K219 Gastro-esophageal reflux disease without esophagitis: Principal | ICD-10-CM

## 2020-01-03 DIAGNOSIS — I10 Essential (primary) hypertension: Principal | ICD-10-CM

## 2020-01-03 DIAGNOSIS — N529 Male erectile dysfunction, unspecified: Principal | ICD-10-CM

## 2020-01-03 LAB — CBC W/ AUTO DIFF
BASOPHILS ABSOLUTE COUNT: 0 10*9/L (ref 0.0–0.1)
BASOPHILS RELATIVE PERCENT: 0.4 %
EOSINOPHILS ABSOLUTE COUNT: 0.3 10*9/L (ref 0.0–0.4)
EOSINOPHILS RELATIVE PERCENT: 3.3 %
HEMATOCRIT: 31.7 % — ABNORMAL LOW (ref 41.0–53.0)
HEMOGLOBIN: 11.1 g/dL — ABNORMAL LOW (ref 13.5–17.5)
LARGE UNSTAINED CELLS: 2 % (ref 0–4)
LYMPHOCYTES ABSOLUTE COUNT: 1.7 10*9/L (ref 1.5–5.0)
LYMPHOCYTES RELATIVE PERCENT: 22.8 %
MEAN CORPUSCULAR HEMOGLOBIN CONC: 35 g/dL (ref 31.0–37.0)
MEAN CORPUSCULAR HEMOGLOBIN: 35.1 pg — ABNORMAL HIGH (ref 26.0–34.0)
MEAN CORPUSCULAR VOLUME: 100.3 fL — ABNORMAL HIGH (ref 80.0–100.0)
MEAN PLATELET VOLUME: 8.4 fL (ref 7.0–10.0)
MONOCYTES ABSOLUTE COUNT: 0.5 10*9/L (ref 0.2–0.8)
MONOCYTES RELATIVE PERCENT: 6.2 %
NEUTROPHILS ABSOLUTE COUNT: 5 10*9/L (ref 2.0–7.5)
NEUTROPHILS RELATIVE PERCENT: 65.4 %
PLATELET COUNT: 266 10*9/L (ref 150–440)
RED BLOOD CELL COUNT: 3.16 10*12/L — ABNORMAL LOW (ref 4.50–5.90)
RED CELL DISTRIBUTION WIDTH: 13.2 % (ref 12.0–15.0)
WBC ADJUSTED: 7.6 10*9/L (ref 4.5–11.0)

## 2020-01-03 LAB — URINALYSIS WITH CULTURE REFLEX
BACTERIA: NONE SEEN /HPF
BILIRUBIN UA: NEGATIVE
BLOOD UA: NEGATIVE
GLUCOSE UA: NEGATIVE
KETONES UA: 20 — AB
LEUKOCYTE ESTERASE UA: NEGATIVE
NITRITE UA: NEGATIVE
PH UA: 5 (ref 5.0–9.0)
PROTEIN UA: NEGATIVE
RBC UA: 1 /HPF (ref <=3)
SPECIFIC GRAVITY UA: 1.017 (ref 1.003–1.030)
SQUAMOUS EPITHELIAL: <1 /HPF (ref 0–5)
UROBILINOGEN UA: 0.2
WBC UA: 1 /HPF (ref <=2)

## 2020-01-03 LAB — AMMONIA: AMMONIA: 12 umol/L (ref 11–32)

## 2020-01-03 LAB — TOXICOLOGY SCREEN, URINE
AMPHETAMINE SCREEN URINE: NEGATIVE
BARBITURATE SCREEN URINE: NEGATIVE
BENZODIAZEPINE SCREEN, URINE: NEGATIVE
BUPRENORPHINE, URINE SCREEN: NEGATIVE
CANNABINOID SCREEN URINE: POSITIVE — AB
COCAINE(METAB.)SCREEN, URINE: NEGATIVE
FENTANYL SCREEN, URINE: NEGATIVE
METHADONE SCREEN, URINE: NEGATIVE
OPIATE SCREEN URINE: NEGATIVE
OXYCODONE SCREEN URINE: NEGATIVE

## 2020-01-03 LAB — FOLATE: FOLATE: 20.5 ng/mL (ref >=5.4)

## 2020-01-03 LAB — ACETAMINOPHEN LEVEL: ACETAMINOPHEN LEVEL: <2 ug/mL (ref <=20.0)

## 2020-01-03 LAB — HIGH SENSITIVITY TROPONIN I - SINGLE: HIGH SENSITIVITY TROPONIN I: 4 ng/L (ref <=53)

## 2020-01-03 LAB — COMPREHENSIVE METABOLIC PANEL
ALBUMIN: 3.8 g/dL (ref 3.4–5.0)
ALKALINE PHOSPHATASE: 77 U/L (ref 46–116)
ALT (SGPT): 13 U/L (ref 10–49)
ANION GAP: 7 mmol/L (ref 5–14)
AST (SGOT): 28 U/L (ref <=34)
BILIRUBIN TOTAL: 0.4 mg/dL (ref 0.3–1.2)
BLOOD UREA NITROGEN: 11 mg/dL (ref 9–23)
BUN / CREAT RATIO: 12
CALCIUM: 9.4 mg/dL (ref 8.7–10.4)
CHLORIDE: 92 mmol/L — ABNORMAL LOW (ref 98–107)
CO2: 26 mmol/L (ref 20.0–31.0)
CREATININE: 0.91 mg/dL
EGFR CKD-EPI AA MALE: >90 mL/min/{1.73_m2} (ref >=60)
EGFR CKD-EPI NON-AA MALE: >90 mL/min/{1.73_m2} (ref >=60)
GLUCOSE RANDOM: 92 mg/dL (ref 70–179)
POTASSIUM: 4.6 mmol/L — ABNORMAL HIGH (ref 3.4–4.5)
PROTEIN TOTAL: 7.3 g/dL (ref 5.7–8.2)
SODIUM: 125 mmol/L — ABNORMAL LOW (ref 135–145)

## 2020-01-03 LAB — ETHANOL: ETHANOL: <10 mg/dL (ref <=10.0)

## 2020-01-03 LAB — VALPROIC ACID LEVEL, TOTAL: VALPROIC ACID TOTAL: 70.5 ug/mL (ref 50.0–100.0)

## 2020-01-03 LAB — B-TYPE NATRIURETIC PEPTIDE: B-TYPE NATRIURETIC PEPTIDE: 81 pg/mL (ref <=100)

## 2020-01-03 LAB — MAGNESIUM: MAGNESIUM: 1.7 mg/dL (ref 1.6–2.6)

## 2020-01-03 LAB — VITAMIN B12: VITAMIN B-12: 548 pg/mL (ref 211–911)

## 2020-01-03 LAB — SALICYLATE LEVEL: SALICYLATE LEVEL: <3 mg/dL (ref 0.0–30.0)

## 2020-01-03 MED ADMIN — lisinopriL (PRINIVIL,ZESTRIL) tablet 20 mg: 20 mg | ORAL | @ 22:00:00 | Stop: 2020-01-05

## 2020-01-03 MED ADMIN — folic acid (FOLVITE) tablet 1 mg: 1 mg | ORAL | @ 22:00:00 | Stop: 2020-01-05

## 2020-01-03 MED ADMIN — multivitamins, therapeutic with minerals tablet 1 tablet: 1 | ORAL | @ 22:00:00 | Stop: 2020-01-05

## 2020-01-03 MED ADMIN — thiamine (B-1) tablet 100 mg: 100 mg | ORAL | @ 22:00:00 | Stop: 2020-01-05

## 2020-01-03 MED ADMIN — sodium chloride 0.9% (NS) bolus 1,000 mL: 1000 mL | INTRAVENOUS | @ 21:00:00 | Stop: 2020-01-03

## 2020-01-03 NOTE — Unmapped (Addendum)
[facility]    Psychiatry Emergency Service     Initial Consult      Service Date:  January 03, 2020  Admit Date/Time: 01/03/2020 11:35 AM  LOS:    LOS: 0 days   Service requesting consult:  Emergency Medicine   Requesting Attending Physician:  Krystal Clark, MD  Location of patient and modality: IN-PERSON--[facility]  Consulting Attending: Lynnea Maizes, MD  Consulting Resident/Provider: Erma Pinto NP    Assessment   Nicholas Rush is a 54 y.o., White or Caucasian race, Not Hispanic or Latino ethnicity,  ENGLISH speaking male  with a history of Alcohol use disorder, history of complicated withdrawal alcoholic encephalopathy, possible bipolar disorder, chronic back pain who presents for evaluation of increasing insomnia little to no sleep past several days, irritability, impulsivity including increased spending behavior (attempting to take out car loans, buy a boat, hitch hiking.  On exam patient is alert and oriented x 4; he reports primary concern is poor sleep and racing thoughts.  He is tangential in conversation with notable grandiosity and confabulation.  He is a poor historian and per collateral has not been medication adherent, Family denies concerns for current ETOH or drug abuse.  Patient was hospitalized for similar presentation on 12/15/2019 and was discharged from [facility] a week ago.  At this time patient's presentation is concerning for mania.  Patient will require inpatient psychiatric hospitalization for ongoing stabilization of mood and thought processes.       The patient is at acutely elevated risk of suicide/dangerousness to others and further worsening of psychiatric condition. Risk factors for suicide for this patient include: current treatment non-compliance, past substance abuse, chronic mood lability , chronic impulsivity and chronic poor judgment.  Risk factors for violence for this patient include: male gender, history of aggressive behavior and past substance abuse. Protective factors for this patient are limited to: lack of active SI/HI, no know access to weapons or firearms, no history of previous suicide attempts , supportive family, sense of responsibility to family and social supports, presence of a significant relationship, expresses purpose for living, safe housing and support system in agreement with treatment recommendations. The patient does meet [facility] involuntary commitment criteria at this time. This was explained to the patient and family member(s), who voiced understanding.    A thorough psychiatric evaluation has been completed including evaluation of the patient, collecting collateral history from wife, reviewing available medical/clinic records, evaluating his unique risk and protective factors, and discussing treatment recommendations.     Diagnoses:   Active Problems:    Unspecified mood (affective) disorder (CMS-HCC) rule out mania        Stressors: unemployed, history of TBI, substance abuse (currently in recovery)          Plan   -- Safety Concerns: We recommend that, following any necessary medical clearance, the patient be admitted to an inpatient psychiatric unit for safety, stabilization and treatment.     -- Disposition: At this time [facility] does not have the capacity to appropriately treat the patient. Referrals to alternative facilites are being made at this time. Psych staff will inform ED clinicians and the patient and patient's support system  when placement is found. Marland Kitchen    -- Admission Status: Involuntary- This has been explained to the patient or appropriate surrogate. 1st QPE completed; please call hospital police if patient attempts to leave..     -- Further Work-up: none at this time     --  Psychiatric Interventions:    #concern for Mania (unspecified affective disorder)  -- Start Zyprexa 10 mg nightly   -- Start Zyprexa 5 mg daily prn agitation  -- Continue home Depakote DR 500 mg BID  -- Hold vrayler (not on formulary, non compliant)  -- Hold Seroquel 200 mg nightly (not taking)     -- General Medical Interventions:   #HTN  Continue home Lisinopril 20 mg daily      #history of Alcohol encephalopathy  -- continue home folic acid 1 mg daily   Continue multivitamin one tablet daily   -- Continue thiamine 100 mg daily      Thank you for this consult. Should you have any questions regarding the assessment, plan, or recommendations please contact on-call psychiatry resident at pager # [phone]     TIME SPENT: 40 minutes    INTERVENTION: Risk assessment and Supportive/Problem solving psychotherapy.     Patient and plan of care were discussed with the Attending MD, Lynnea Maizes , who agrees with the above statement and plan.           Patient had recent CSSRS screening performed which rated them as a negative screen. At this time we recommend Q 15 Minute Obs level of observation once in secure area.  Given risk of elopement would benefit from one to one in ED medical milieu.. This decision is based on my review the chart, interview of the patient, mental status examination, and consideration of suicide risk factors.       [name], PMHNP      Subjective:      Reason for consult: Safety Assessment     Per Triage: Patient presents with reported foot swelling on the L side, patient worried that recent psychiatric medication prescriptions have caused this swelling and is concerned about a blood clot. Patient reports he hasn't taken his medication for 3 weeks and is very anxious. BP elevated in triage.    Per EM Provider:     Pt Interview: Patient seen and nursing notes reviewed. Patient is pleasant on approach wandering around room in the ED.  Reports he is here as he has not been sleeping and feels his thoughts are going really fast  Endorses anxiety about getting driver's license back.  Goes on to confabulate about multiple business ventures, working for [employer], going to [employer], starting Holiday representative business in [place] among others..  Thought processes are tangential with notable grandiosity.  He denies SI, HI or AVH.  Patient is  A and 0 x 4, cam ICU negative for delirium.  He reports that his sleep has been poor past few nights but is a poor historian with regard to medication compliance / current treatment providers and history         Pertinent Negatives: The pt denies SI, recent aborted attempts, suicidal planning or research, HI, psychosis, hallucinations, CAH, paranoia and thought insertion/blocking.       COLLATERAL:  [name] [phone]) reports patient has continued to decompensate since discharge from [facility] on 12/8.  She notes poor sleep, wanders at night, he is sleeping about 3 hours a night.  During the day he is supervised by their adult daughter age 53.  Daughter has contacted mom repeatedly verbalizing concernfor her own safety due to the erratic behavior of the patient., level of irritability, verbal aggression at home. Yesterday patient Kicked the wall,  cussed out neighbors, was belligerent with daughter.  She reports he becomes frustrated when asked to  take medications, and becomes angry upset if wife is not able to come home immediately to take care of him.  Wife notes that he has been sober as far as she knows since September 2021.  Reports history of complicated withdrawal.       Social History     Socioeconomic History   ??? Marital status: Married     Spouse name: Not on file   ??? Number of children: Not on file   ??? Years of education: Not on file   ??? Highest education level: Not on file   Occupational History   ??? Not on file   Tobacco Use   ??? Smoking status: Current Every Day Smoker     Packs/day: 1.00     Years: 30.00     Pack years: 30.00     Types: Cigarettes   ??? Smokeless tobacco: Never Used   Substance and Sexual Activity   ??? Alcohol use: Yes     Alcohol/week: 42.0 standard drinks     Types: 42 Cans of beer per week     Comment: 6 pack every day   ??? Drug use: No   ??? Sexual activity: Not on file   Other Topics Concern   ??? Do you use sunscreen? Yes   ??? Tanning bed use? Yes   ??? Are you easily burned? Yes   ??? Excessive sun exposure? No   ??? Blistering sunburns? Yes   Social History Narrative    Updated 01/03/2020        Lives in [place] with wife and two children.  Currently unemployed had Worked at [employer].  Current smoker.History of excessive ETOh abuse has been sober since September 2021.Marland Kitchen           PSYCHIATRIC HX:     -Current provider(s):  [facility] Dr. Roxanne Gates     -Suicide attempts/SIB: NO    -Psych Hospitalizations:  2014 for depression at [facility]    -Med compliance hx: Poor,      -Fa hx suicide: YES, sister attempted         SUBSTANCE ABUSE HX:     -Current using substance: sig hx of alcohol use (last drink Sept 2021) and was hospitalized for withdrawal with DT    -Hx w/d sxs: YES    -Sz Hx: YES,    -DT Hx:YES, Sept 2021        SOCIAL HX:    -Current living environment: lives in [place] with wife and 2 childrne     -Current support: wife, father     -Violence (perp): verbal, has been kicking walls     -Access to Firearms: NO        -Guardian: NO        -Trauma: NO             Social Determinants of Health     Financial Resource Strain: Low Risk    ??? Difficulty of Paying Living Expenses: Not hard at all   Food Insecurity: No Food Insecurity   ??? Worried About Programme researcher, broadcasting/film/video in the Last Year: Never true   ??? Ran Out of Food in the Last Year: Never true   Transportation Needs: No Transportation Needs   ??? Lack of Transportation (Medical): No   ??? Lack of Transportation (Non-Medical): No   Physical Activity: Not on file   Stress: Not on file   Social Connections: Not on file       Objective:  VS:   Vital Signs  Temp: 36.4 ??C (97.6 ??F)  Temp Source: Oral  Heart Rate: 67  Heart Rate Source: Monitor  Resp: 17  BP: 145/79  MAP (mmHg): 96  BP Location: Left arm  BP Method: Automatic  Patient Position: Lying    Mental Status Exam:  Appearance:    Appears stated age and Disheveled   Behavior: Cooperative and Litmited to no eye contact   Motor:   restless paces at times during interview   Speech/Language:    Language intact, well formed   Mood:   Anxious   Affect:   Anxious and Mood congruent   Thought process:   Circumstantial and Tangential confabulates    Thought content:     denies SI, HI, grandiose in conversation, confabulates    Perceptual disturbances:     Denies auditory and visual hallucinations, behavior not concerning for response to internal stimuli     Orientation:   Oriented to person, place, time, and general circumstances   Attention:   Able to fully attend without fluctuations in consciousness   Concentration:   Distractible   Memory:   Immediate, short-term, long-term, and recall grossly intact    Fund of knowledge:    Consistent with level of education and development   Insight:     Impaired   Judgment:    Impaired   Impulse Control:   Impaired     Mask Adherence: During the evaluation the pt did not adhere to ED COVID masking policy.         ROS:  Complains of chronic back pain, swelling to lower extremities, otherwise unremarkable review of systems    PHYS:  PHYSICAL EXAM:  ??? General: NAD  ??? Eyes: Sclera clear. No nystagmus or discharge  ??? ENT: Hearing grossly intact  ??? Lungs: Non-labored breathing  ??? Neuro: Normal gait, no tremor observed    Labs:   Lab results last 24 hours:    Recent Results (from the past 24 hour(s))   ECG 12 Lead    Collection Time: 01/03/20 12:08 PM   Result Value Ref Range    EKG Systolic BP  mmHg    EKG Diastolic BP  mmHg    EKG Ventricular Rate 71 BPM    EKG Atrial Rate 71 BPM    EKG P-R Interval 122 ms    EKG QRS Duration 82 ms    EKG Q-T Interval 380 ms    EKG QTC Calculation 412 ms    EKG Calculated P Axis 27 degrees    EKG Calculated R Axis 44 degrees    EKG Calculated T Axis 31 degrees    QTC Fredericia 402 ms   Comprehensive Metabolic Panel    Collection Time: 01/03/20 12:35 PM   Result Value Ref Range    Sodium 125 (L) 135 - 145 mmol/L    Potassium 4.6 (H) 3.4 - 4.5 mmol/L    Chloride 92 (L) 98 - 107 mmol/L    Anion Gap 7 5 - 14 mmol/L    CO2 26.0 20.0 - 31.0 mmol/L    BUN 11 9 - 23 mg/dL    Creatinine 2.95 2.84 - 1.10 mg/dL    BUN/Creatinine Ratio 12     EGFR CKD-EPI Non-African American, Male >90 >=60 mL/min/1.88m2    EGFR CKD-EPI African American, Male >90 >=60 mL/min/1.45m2    Glucose 92 70 - 179 mg/dL    Calcium 9.4 8.7 - 13.2 mg/dL    Albumin 3.8 3.4 -  5.0 g/dL    Total Protein 7.3 5.7 - 8.2 g/dL    Total Bilirubin 0.4 0.3 - 1.2 mg/dL    AST 28 <=16 U/L    ALT 13 10 - 49 U/L    Alkaline Phosphatase 77 46 - 116 U/L   Ethanol    Collection Time: 01/03/20 12:35 PM   Result Value Ref Range    Alcohol, Ethyl <10.0 <=10.0 mg/dL   B-type natriuretic peptide    Collection Time: 01/03/20 12:35 PM   Result Value Ref Range    BNP 81 <=100 pg/mL   hsTroponin I (single, no delta)    Collection Time: 01/03/20 12:35 PM   Result Value Ref Range    hsTroponin I 4 <=53 ng/L   Salicylate level    Collection Time: 01/03/20 12:35 PM   Result Value Ref Range    Salicylate Lvl <3.0 0.0 - 30.0 mg/dL   Acetaminophen level    Collection Time: 01/03/20 12:35 PM   Result Value Ref Range    Acetaminophen Level <2.0 <=20.0 ug/ml   CBC w/ Differential    Collection Time: 01/03/20 12:35 PM   Result Value Ref Range    WBC 7.6 4.5 - 11.0 10*9/L    RBC 3.16 (L) 4.50 - 5.90 10*12/L    HGB 11.1 (L) 13.5 - 17.5 g/dL    HCT 10.9 (L) 60.4 - 53.0 %    MCV 100.3 (H) 80.0 - 100.0 fL    MCH 35.1 (H) 26.0 - 34.0 pg    MCHC 35.0 31.0 - 37.0 g/dL    RDW 54.0 98.1 - 19.1 %    MPV 8.4 7.0 - 10.0 fL    Platelet 266 150 - 440 10*9/L    Neutrophils % 65.4 %    Lymphocytes % 22.8 %    Monocytes % 6.2 %    Eosinophils % 3.3 %    Basophils % 0.4 %    Absolute Neutrophils 5.0 2.0 - 7.5 10*9/L    Absolute Lymphocytes 1.7 1.5 - 5.0 10*9/L    Absolute Monocytes 0.5 0.2 - 0.8 10*9/L    Absolute Eosinophils 0.3 0.0 - 0.4 10*9/L    Absolute Basophils 0.0 0.0 - 0.1 10*9/L    Large Unstained Cells 2 0 - 4 % Macrocytosis Slight (A) Not Present   Ammonia    Collection Time: 01/03/20 12:35 PM   Result Value Ref Range    Ammonia 12 11 - 32 umol/L   COVID-19 PCR    Collection Time: 01/03/20 12:37 PM    Specimen: Nasopharyngeal Swab   Result Value Ref Range    SARS-CoV-2 PCR Negative Negative       Labs reviewed, unremarkable with exception of: anemia chronic,  Hyponatremia, slight hyperkalemia  (will get one time fluid bolus)    EKG: NORMAL SINUS RHYTHM  NORMAL ECG  WHEN COMPARED WITH ECG OF 02-Dec-2019 08:00,  NO SIGNIFICANT CHANGE WAS FOUND    Imaging: Radiology report(s) reviewed.  Ultrasound of lower extremities negative for acute findings.          SAFE-T Protocol with C-SSRS - Initial    Step 1: Identify Risk Factors    C-SSRS Suicidal Ideation Severity  1)Wish to be dead  Within the last month, have you wished you were dead or wished you could go to sleep and not wake up? No (01/03/20 4782)   2)Suicidal Thoughts  Within the last month, have you actually had any thoughts of killing yourself? Yes (01/03/20 9562)  3)Suicidal Thoughts with Method Without Specific Plan or Intent to Act  Within the last month, have you been thinking about how you might kill yourself? No (01/03/20 1610)   4)Suicidal Intent Without Specific Plan  Within the last month, have you had these thoughts and had some intention of acting on them?  No (01/03/20 9604)   5)Suicide Intent with Specific Plan  Within the last month, have you started to work out or worked out the details of how to kill yourself? Do you intend to carry out this plan? No (01/03/20 5409)   6) Suicide Behavior Question  Within your lifetime, have you ever done anything, started to do anything, or prepared to do anything to end your life?  No (01/03/20 8119)      Lifetime Past 3 Months   How long ago did you do any of these?                     First Initial Risk Level Low Risk (01/03/20 1478)         ++Safety Note: All patients awaiting psychiatric evaluation and/or disposition in the ED shall be placed in a locked behavioral health area as space permits. When in this setting they will be monitored via live video feed++

## 2020-01-03 NOTE — Unmapped (Signed)
Patient presents with reported foot swelling on the L side, patient worried that recent psychiatric medication prescriptions have caused this swelling and is concerned about a blood clot. Patient reports he hasn't taken his medication for 3 weeks and is very anxious. BP elevated in triage.

## 2020-01-03 NOTE — Unmapped (Signed)
Nicholas Rush's wife called this am.  Nicholas Nicholas Rush has developed bilateral leg swelling.  He is uncomfortable and she is concerned. No appointments today she will take him to the ED.  Has a follow up already scheduled for tomorrow will keep appt.

## 2020-01-03 NOTE — Unmapped (Signed)
Emergency Department Provider Note        ED Clinical Impression     Final diagnoses:   Hypomania (CMS-HCC) (Primary)     ED Assessment/Plan       History     Chief Complaint   Patient presents with   ??? Foot Swelling     HPI  Nicholas Rush is a 54 y.o. male with history of HTN, GERD, alcoholic encephalopathy, Bipolar 1 disorder, and alcohol use disorder presenting to the ED with bilateral leg swelling as well as erratic behavior. Per chart review, patient was seen at [facility] ED on 12/15/19 for erratic and manic behavior, specifically increased spending, grandiosity of ideas, and decreased sleep. At that time, he was IVC'd to an outside hospital until 12/27/19. Prior to being discharged, patient had significant changes made to his psychiatric medications.    Since being discharged from the hospital, patient has had worsening swelling to his bilateral lower legs (L>R). He endorses some mild shortness of breath secondary to chronic respiratory problems but denies any leg pain, chest pain, or other symptoms at this time. He has received 2 doses of the COVID vaccine.    Of note, patient's significant other reports that since being discharged, patient has had persistent and worsening erratic behavior. Specifically, she cites incidents such as patient attempting to buy boats or becoming aggravated and kicking the wall. She also states that patient has had decreased sleep (~3 hours each night) in the setting of being increasingly anxious and having racing thoughts. Patient is taken care of by his daughter, but significant other states that his daughter has become afraid to take care of him due to his recent behavior. Patient has also intermittently failed to take some of his medications since being discharged, per significant other. Patient states that he has had some thoughts of hurting others but that this has been ongoing for years. He denies any acute HI, SI, or AVH.    Past Medical History:   Diagnosis Date   ??? Anxiety    ??? Depressive disorder    ??? Erectile dysfunction    ??? Esophageal reflux    ??? History of pyloric stenosis    ??? Hypertension    ??? Insomnia    ??? Psoriasis     Dermatologist - Dr. Deloria Lair in [facility]   ??? Seizure (CMS-HCC) 02/20/2019   ??? Tobacco use     1ppd       Past Surgical History:   Procedure Laterality Date   ??? PYLOROMYOTOMY     ??? WISDOM TOOTH EXTRACTION         Family History   Problem Relation Age of Onset   ??? Bipolar disorder Mother    ??? Depression Sister    ??? Alcohol abuse Sister    ??? Alcohol abuse Maternal Aunt    ??? Melanoma Neg Hx    ??? Basal cell carcinoma Neg Hx    ??? Squamous cell carcinoma Neg Hx        Social History     Socioeconomic History   ??? Marital status: Married     Spouse name: Not on file   ??? Number of children: Not on file   ??? Years of education: Not on file   ??? Highest education level: Not on file   Occupational History   ??? Not on file   Tobacco Use   ??? Smoking status: Current Every Day Smoker     Packs/day: 1.00  Years: 30.00     Pack years: 30.00     Types: Cigarettes   ??? Smokeless tobacco: Never Used   Substance and Sexual Activity   ??? Alcohol use: Yes     Alcohol/week: 42.0 standard drinks     Types: 42 Cans of beer per week     Comment: 6 pack every day   ??? Drug use: No   ??? Sexual activity: Not on file   Other Topics Concern   ??? Do you use sunscreen? Yes   ??? Tanning bed use? Yes   ??? Are you easily burned? Yes   ??? Excessive sun exposure? No   ??? Blistering sunburns? Yes   Social History Narrative    Updated 11/29/2019        Lives in [place] with wife and two children.  Works [employer].  Current smoker.  Alcohol intake is excessive.  On some days it's 12 beers.        PSYCHIATRIC HX:     -Current provider(s):          -Psych Hospitalizations:  2014 for depression at [facility]    -Med compliance hx: Poor, fair, good             SUBSTANCE ABUSE HX:     -Current using substance: sig hx of alcohol use (last drink Sept 2021) and was hospitalized for withdrawal with DT -Hx w/d sxs: YES    -Sz Hx: YES,    -DT Hx:YES, Sept 2021        SOCIAL HX:    -Current living environment: lives in [place] with wife and 2 childrne     -Current support: wife, father                 -Guardian: NO                     Social Determinants of Health     Financial Resource Strain: Low Risk    ??? Difficulty of Paying Living Expenses: Not hard at all   Food Insecurity: No Food Insecurity   ??? Worried About Programme researcher, broadcasting/film/video in the Last Year: Never true   ??? Ran Out of Food in the Last Year: Never true   Transportation Needs: No Transportation Needs   ??? Lack of Transportation (Medical): No   ??? Lack of Transportation (Non-Medical): No   Physical Activity: Not on file   Stress: Not on file   Social Connections: Not on file       Review of Systems   Constitutional: Negative for fatigue and fever.   HENT: Negative for congestion, sore throat and trouble swallowing.    Eyes: Negative for discharge.   Respiratory: Negative for cough and wheezing.    Cardiovascular: Positive for leg swelling. Negative for chest pain.   Gastrointestinal: Negative for abdominal pain, diarrhea, nausea and vomiting.   Genitourinary: Negative for dysuria.   Musculoskeletal: Negative for back pain and myalgias.   Skin: Negative for rash and wound.   Neurological: Negative for seizures, weakness and headaches.   Psychiatric/Behavioral: Positive for behavioral problems and sleep disturbance.       Physical Exam     BP 145/79  - Pulse 67  - Temp 36.4 ??C (97.6 ??F) (Oral)  - Resp 17  - SpO2 100%     Physical Exam  Vitals and nursing note reviewed.   Constitutional:       General: He is not in acute distress.  Appearance: He is well-developed. He is not diaphoretic.   HENT:      Head: Normocephalic and atraumatic.   Eyes:      General:         Right eye: No discharge.         Left eye: No discharge.      Pupils: Pupils are equal, round, and reactive to light.   Neck:      Trachea: No tracheal deviation.   Cardiovascular:      Rate and Rhythm: Normal rate and regular rhythm.      Heart sounds: Normal heart sounds.   Pulmonary:      Effort: Pulmonary effort is normal. No respiratory distress.      Breath sounds: Normal breath sounds. No wheezing.   Abdominal:      General: There is no distension.      Palpations: Abdomen is soft.      Tenderness: There is no abdominal tenderness. There is no rebound.   Musculoskeletal:         General: No tenderness. Normal range of motion.      Right lower leg: Edema (1+) present.      Left lower leg: Edema (1+) present.      Comments: Equal bilaterally   Skin:     General: Skin is warm and dry.      Findings: No rash.   Neurological:      Mental Status: He is alert and oriented to person, place, and time.      Cranial Nerves: No cranial nerve deficit.   Psychiatric:         Thought Content: Thought content normal.      Comments: Somewhat tangential, needs to be redirected.         ED Course     Vital signs stable, patient is nontoxic appearing. He is somewhat tangential and needs to be redirected during my exam. Will order PVL of BLE for his leg swelling. Per significant other, patient has had persistent erratic behavior since being discharged last week. This is likely related to unresolved psychiatric problems. I will not IVC at this time, patient is here voluntarily without acute HI, SI, or AVH. Plan to consult PES and order labwork to medically clear patient.    12:18 PM  Spoke with PES, who stated that they will see patient once he is medically cleared. Will repage after workup has resulted.    2:10 PM  Patient medically cleared and evaluated by PES. Will go ahead with admission to psychiatric unit. Patient informed of the plan.    ED Course as of 01/03/20 1519   Wed Jan 03, 2020   1227 IMPRESSION:  ??  Clear lungs.          Specimen Collected: 01/03/20 12:21           1247 Spouse left to go to work but states she can be reached on her cell phone.  Awaiting psychiatric evaluation and lab work at this time. 1317 Right Technical Summary  No evidence of deep venous obstruction in the lower extremity. No indirect evidence of obstruction proximal to the inguinal ligament.     Left Technical Summary  No evidence of deep venous obstruction in the lower extremity. No indirect evidence of obstruction proximal to the inguinal ligament.            Specimen Collected: 01/03/20 12:47           1333 Noted to be slightly  hyponatremic but has been low in the past and I do not think this is the acute cause of the patient's hypomanic behavior.   1340 SARS-CoV-2 PCR: Negative       MDM  Reviewed: previous chart, nursing note and vitals  Reviewed previous: labs, x-ray and ECG  Interpretation: labs, x-ray and ECG      ________________________________  Documentation assistance was provided by Lacey Jensen, Scribe, on January 03, 2020 at 11:41 AM for Shelia Media, MD.    January 03, 2020 2:59 PM. Documentation assistance provided by the scribe. I was present during the time the encounter was recorded. The information recorded by the scribe was done at my direction and has been reviewed and validated by me.          Nicholas Clark, MD  01/03/20 [id number]

## 2020-01-03 NOTE — Unmapped (Signed)
Pt changed out,  Belongings  Removed out of pt reach

## 2020-01-03 NOTE — Unmapped (Signed)
01/03/20    Travel Screening Questions Completed.    Travel Screening Questions/Answers:  1). Have you traveled out of [place] within the last 14 days?: No  2). Do you have any of the following COVID-19 symptoms that are new or worsening: cough, shortness of breath, loss of taste/smell, sore throat, fever or feeling feverish, repeated shaking chills, muscle pain, vomiting, or diarrhea?: No  3).    4). Have you tested positive in the last 21 days for COVID-19?: No      Negative Travel Screen: Patient answered NO to questions 2, 3, and 4. Offer Telephone visit, Virtual Visit or In Person Visit according to the current scheduling protocol for your clinic.       The call was handled in the following manner: PT'S WIFE CALLED AND STATED HIS LEG ARE SWOLLEN AND HAS HASN'T BEEN TAKING HIS ANITPSYCHOTIC MEDS AND WANTED TO SPEAK WITH TRIAGE NURSE TO SEE IF THEY SHOULD TAKE HIM TO ED. SENT ON TO TRIAGE

## 2020-01-04 LAB — BASIC METABOLIC PANEL
ANION GAP: 5 mmol/L (ref 5–14)
BLOOD UREA NITROGEN: 13 mg/dL (ref 9–23)
BUN / CREAT RATIO: 15
CALCIUM: 9 mg/dL (ref 8.7–10.4)
CHLORIDE: 96 mmol/L — ABNORMAL LOW (ref 98–107)
CO2: 26 mmol/L (ref 20.0–31.0)
CREATININE: 0.87 mg/dL
EGFR CKD-EPI AA MALE: >90 mL/min/{1.73_m2} (ref >=60)
EGFR CKD-EPI NON-AA MALE: >90 mL/min/{1.73_m2} (ref >=60)
GLUCOSE RANDOM: 107 mg/dL (ref 70–179)
POTASSIUM: 4.3 mmol/L (ref 3.4–4.5)
SODIUM: 127 mmol/L — ABNORMAL LOW (ref 135–145)

## 2020-01-04 MED ADMIN — multivitamins, therapeutic with minerals tablet 1 tablet: 1 | ORAL | @ 14:00:00 | Stop: 2020-01-05

## 2020-01-04 MED ADMIN — OLANZapine zydis (ZyPREXA) disintegrating tablet 10 mg: 10 mg | ORAL | @ 02:00:00 | Stop: 2020-01-05

## 2020-01-04 MED ADMIN — thiamine (B-1) tablet 100 mg: 100 mg | ORAL | @ 14:00:00 | Stop: 2020-01-05

## 2020-01-04 MED ADMIN — calcium carbonate (TUMS) chewable tablet 200 mg of elem calcium: 200 mg | ORAL | @ 15:00:00 | Stop: 2020-01-05

## 2020-01-04 MED ADMIN — acetaminophen (TYLENOL) tablet 650 mg: 650 mg | ORAL | @ 15:00:00 | Stop: 2020-01-05

## 2020-01-04 MED ADMIN — pantoprazole (PROTONIX) EC tablet 40 mg: 40 mg | ORAL | @ 15:00:00 | Stop: 2020-01-05

## 2020-01-04 MED ADMIN — ibuprofen (MOTRIN) tablet 400 mg: 400 mg | ORAL | @ 15:00:00 | Stop: 2020-01-05

## 2020-01-04 MED ADMIN — divalproex (DEPAKOTE) DR tablet 500 mg: 500 mg | ORAL | @ 14:00:00 | Stop: 2020-01-05

## 2020-01-04 MED ADMIN — divalproex (DEPAKOTE) DR tablet 500 mg: 500 mg | ORAL | @ 06:00:00 | Stop: 2020-01-05

## 2020-01-04 MED ADMIN — folic acid (FOLVITE) tablet 1 mg: 1 mg | ORAL | @ 14:00:00 | Stop: 2020-01-05

## 2020-01-04 NOTE — Unmapped (Signed)
Patient has one small brown paper bag. The family take the rest of his belongings.

## 2020-01-04 NOTE — Unmapped (Addendum)
This Arts administrator member [place] at 11:40a that pt requires transport from [facility] ED to [facility] [address], [place] [address]. This Clinical research associate provided the staff member with the request for transport paperwork.     This Clinical research associate spoke to the patient and wife and patient relationsabout transfer to [facility] [address], [place] [address]. Identified pt legal status as Involuntary and details of transportation by law enforcement (ie [employer]). Questions regarding the process were answered and understanding was verbalized.     Dub Amis LCSW LCAS  [facility]

## 2020-01-05 MED ADMIN — acetaminophen (TYLENOL) tablet 650 mg: 650 mg | ORAL | @ 12:00:00 | Stop: 2020-01-05

## 2020-01-05 MED ADMIN — divalproex (DEPAKOTE) DR tablet 500 mg: 500 mg | ORAL | @ 14:00:00 | Stop: 2020-01-05

## 2020-01-05 MED ADMIN — multivitamins, therapeutic with minerals tablet 1 tablet: 1 | ORAL | @ 14:00:00 | Stop: 2020-01-05

## 2020-01-05 MED ADMIN — thiamine (B-1) tablet 100 mg: 100 mg | ORAL | @ 14:00:00 | Stop: 2020-01-05

## 2020-01-05 MED ADMIN — OLANZapine zydis (ZyPREXA) disintegrating tablet 10 mg: 10 mg | ORAL | @ 01:00:00 | Stop: 2020-01-05

## 2020-01-05 MED ADMIN — folic acid (FOLVITE) tablet 1 mg: 1 mg | ORAL | @ 14:00:00 | Stop: 2020-01-05

## 2020-01-05 MED ADMIN — lisinopriL (PRINIVIL,ZESTRIL) tablet 20 mg: 20 mg | ORAL | @ 14:00:00 | Stop: 2020-01-05

## 2020-01-05 MED ADMIN — melatonin tablet 6 mg: 6 mg | ORAL | @ 06:00:00 | Stop: 2020-01-05

## 2020-01-05 MED ADMIN — diphenhydrAMINE (BENADRYL) capsule/tablet 50 mg: 50 mg | ORAL | @ 06:00:00 | Stop: 2020-01-05

## 2020-01-05 MED ADMIN — divalproex (DEPAKOTE) DR tablet 500 mg: 500 mg | ORAL | @ 01:00:00 | Stop: 2020-01-05

## 2020-01-05 MED ADMIN — OLANZapine zydis (ZyPREXA) disintegrating tablet 5 mg: 5 mg | ORAL | @ 02:00:00 | Stop: 2020-01-05

## 2020-01-05 MED ADMIN — pantoprazole (PROTONIX) EC tablet 40 mg: 40 mg | ORAL | @ 14:00:00 | Stop: 2020-01-05

## 2020-01-05 NOTE — Unmapped (Signed)
Wife called [facility] again to ask if patient had been offered any other outside hospital bed besides [facility].  Writer informed wife that patient had only been offered a bed at [facility] & plan was to transfer to that facility tomorrow.  Wife thanked Clinical research associate then hung up the phone.    Dub Amis LCSW LCAS  [facility]

## 2020-01-05 NOTE — Unmapped (Signed)
ED Progress Note    Patient requesting medication for sleep and reviewed with psychiatry.  Has as needed Zyprexa ordered for agitation and I will add for anxiety as patient is having some anxiety about being transferred to a facility tomorrow.

## 2020-01-05 NOTE — Unmapped (Signed)
ED Progress Note:    Patient being transferred out today to inpatient psychiatric unit for stabilization of bipolar mania, to [facility]. Per nursing, patient has been ambulating and taking PO well. His vital signs today below. I have reviewed his laboratory data. He was slightly hyponatremic on 12/15, that improved to 127, which is consistent with prior levels in epic. Appears to be chronic in nature. Patient stable for transfer at this time.   Date and Time Temp Temp src Pulse SpO2 Pulse Heart Rate Source Resp BP MAP (mmHg) BP Location BP Method Patient Position 0-10 Pain Scale SpO2 FiO2 (%) O2 Flow Rate (L/min) O2 Device User   01/05/20 0917 36.5 ??C (97.7 ??F) -- 75 -- -- 18 148/77 -- -- -- -- -- 98 % -- -- -- CH

## 2020-01-15 ENCOUNTER — Ambulatory Visit: Admit: 2020-01-15 | Discharge: 2020-01-16 | Payer: MEDICAID | Attending: Psychiatry | Primary: Psychiatry

## 2020-01-15 DIAGNOSIS — F1020 Alcohol dependence, uncomplicated: Principal | ICD-10-CM

## 2020-01-15 DIAGNOSIS — F319 Bipolar disorder, unspecified: Principal | ICD-10-CM

## 2020-01-15 NOTE — Unmapped (Signed)
Venipuncture performed in left cubital fossa using a 21g butterfly needle. 1 attempt needed to collect 2 tubes. Gauze placed with pressure on venipuncture site until hemostasis observed. Bandage placed. Patient tolerated well.
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
## 2020-01-15 NOTE — Unmapped (Signed)
We made the following medication changes today:  No changes today  Continue to take your Zyprexa 15mg  at night  Continue to take your Depakote 500mg  twice a day  Continue to take Naltrexone 50mg  daily  Can take Melatonin 1-3mg  at dinner time every night     Follow-up instructions:  -- Please continue taking your medications as prescribed for your mental health.   -- Do not make changes to your medications, including taking more or less than prescribed, unless under the supervision of your physician. Be aware that some medications may make you feel worse if abruptly stopped  -- Please refrain from using illicit substances, as these can affect your mood and could cause anxiety or other concerning symptoms.   -- Seek further medical care for any increase in symptoms or new symptoms such as thoughts of wanting to hurt yourself or hurt others.   -- Please follow-up 02/19/20 10:00AM    Contact info:  Life-threatening emergencies: Call 911 or go to the nearest ER for medical or psychiatric attention.      Issues that need urgent attention but are not life threatening: Call the clinic outpatient frontdesk at [phone] for assistance.      Non-urgent routine concerns, questions, and refill requests: The best way to reach me is through a MyChart message and I Delvecchio Madole get back to you within 2 business days.      Regarding appointments:  - If you need to cancel your appointment, we ask that you call [phone] at least 24 hours before your scheduled appointment.  - If for any reason you arrive 15 minutes later than your scheduled appointment time, you may not be seen and your visit may be rescheduled.  - Please remember that we Annaston Upham not automatically reschedule missed appointments.  - If you miss two (2) appointments without letting us know in advance, you Mickal Meno likely be referred to a provider in your community.  - We Alyssabeth Bruster do our best to be on time. Sometimes an emergency Harold Moncus arise that might cause your clinician to be late. We Corleen Otwell try to inform you of this when you check in for your appointment. If you wait more than 15 minutes past your appointment time without such notice, please speak with the front desk staff.     In the event of bad weather, the clinic staff Alyn Jurney attempt to contact you, should your appointment need to be rescheduled. Additionally, you can call the Patient Weather Line [phone] for system-wide clinic status     For more information and reminders regarding clinic policies (these were provided when you were admitted to the clinic), please ask the front desk.

## 2020-01-16 LAB — COMPREHENSIVE METABOLIC PANEL
ALBUMIN: 3.6 g/dL (ref 3.4–5.0)
ALKALINE PHOSPHATASE: 76 U/L (ref 46–116)
ALT (SGPT): 18 U/L (ref 10–49)
ANION GAP: 7 mmol/L (ref 5–14)
AST (SGOT): 33 U/L (ref <=34)
BILIRUBIN TOTAL: 0.5 mg/dL (ref 0.3–1.2)
BLOOD UREA NITROGEN: 16 mg/dL (ref 9–23)
BUN / CREAT RATIO: 20
CALCIUM: 9.5 mg/dL (ref 8.7–10.4)
CHLORIDE: 89 mmol/L — ABNORMAL LOW (ref 98–107)
CO2: 26 mmol/L (ref 20.0–31.0)
CREATININE: 0.8 mg/dL
EGFR CKD-EPI AA MALE: >90 mL/min/{1.73_m2} (ref >=60)
EGFR CKD-EPI NON-AA MALE: >90 mL/min/{1.73_m2} (ref >=60)
GLUCOSE RANDOM: 74 mg/dL (ref 70–179)
POTASSIUM: 4.6 mmol/L — ABNORMAL HIGH (ref 3.4–4.5)
PROTEIN TOTAL: 7.4 g/dL (ref 5.7–8.2)
SODIUM: 122 mmol/L — ABNORMAL LOW (ref 135–145)

## 2020-01-16 LAB — CBC W/ AUTO DIFF
BASOPHILS ABSOLUTE COUNT: 0 10*9/L (ref 0.0–0.1)
BASOPHILS RELATIVE PERCENT: 0.6 %
EOSINOPHILS ABSOLUTE COUNT: 0.4 10*9/L (ref 0.0–0.4)
EOSINOPHILS RELATIVE PERCENT: 5.6 %
HEMATOCRIT: 32.6 % — ABNORMAL LOW (ref 41.0–53.0)
HEMOGLOBIN: 11.3 g/dL — ABNORMAL LOW (ref 13.5–17.5)
LARGE UNSTAINED CELLS: 3 % (ref 0–4)
LYMPHOCYTES ABSOLUTE COUNT: 2.1 10*9/L (ref 1.5–5.0)
LYMPHOCYTES RELATIVE PERCENT: 28 %
MEAN CORPUSCULAR HEMOGLOBIN CONC: 34.7 g/dL (ref 31.0–37.0)
MEAN CORPUSCULAR HEMOGLOBIN: 34.1 pg — ABNORMAL HIGH (ref 26.0–34.0)
MEAN CORPUSCULAR VOLUME: 98.1 fL (ref 80.0–100.0)
MEAN PLATELET VOLUME: 9.8 fL (ref 7.0–10.0)
MONOCYTES ABSOLUTE COUNT: 0.6 10*9/L (ref 0.2–0.8)
MONOCYTES RELATIVE PERCENT: 8.7 %
NEUTROPHILS ABSOLUTE COUNT: 4 10*9/L (ref 2.0–7.5)
NEUTROPHILS RELATIVE PERCENT: 54.4 %
PLATELET COUNT: 307 10*9/L (ref 150–440)
RED BLOOD CELL COUNT: 3.33 10*12/L — ABNORMAL LOW (ref 4.50–5.90)
RED CELL DISTRIBUTION WIDTH: 13.4 % (ref 12.0–15.0)
WBC ADJUSTED: 7.4 10*9/L (ref 4.5–11.0)

## 2020-01-16 LAB — SLIDE REVIEW

## 2020-01-16 LAB — T4, FREE: FREE T4: 1.37 ng/dL (ref 0.89–1.76)

## 2020-01-16 LAB — VALPROIC ACID LEVEL, TOTAL: VALPROIC ACID TOTAL: 58.5 ug/mL (ref 50.0–100.0)

## 2020-01-16 LAB — T3, FREE: T3 FREE: 2.76 pg/mL (ref 2.30–4.20)

## 2020-01-16 LAB — TSH: THYROID STIMULATING HORMONE: 0.57 u[IU]/mL (ref 0.550–4.780)

## 2020-01-16 NOTE — Unmapped (Signed)
I was immediately available via  phone/pager and was present on site.  I reviewed and discussed the case with the resident, but did not see the patient.  I agree with the assessment and plan as documented in the resident's note.     Horton Marshall, MD

## 2020-01-17 ENCOUNTER — Ambulatory Visit: Admit: 2020-01-17 | Discharge: 2020-01-18 | Discharge status: Against Medical Advice | Payer: MEDICAID

## 2020-01-17 DIAGNOSIS — Z5321 Procedure and treatment not carried out due to patient leaving prior to being seen by health care provider: Principal | ICD-10-CM

## 2020-01-17 LAB — CBC W/ AUTO DIFF
BASOPHILS ABSOLUTE COUNT: 0 10*9/L (ref 0.0–0.1)
BASOPHILS RELATIVE PERCENT: 0.8 %
EOSINOPHILS ABSOLUTE COUNT: 0.3 10*9/L (ref 0.0–0.7)
EOSINOPHILS RELATIVE PERCENT: 5.3 %
HEMATOCRIT: 29.9 % — ABNORMAL LOW (ref 38.0–50.0)
HEMOGLOBIN: 10.6 g/dL — ABNORMAL LOW (ref 13.5–17.5)
LYMPHOCYTES ABSOLUTE COUNT: 2.3 10*9/L (ref 0.7–4.0)
LYMPHOCYTES RELATIVE PERCENT: 37.8 %
MEAN CORPUSCULAR HEMOGLOBIN CONC: 35.4 g/dL (ref 30.0–36.0)
MEAN CORPUSCULAR HEMOGLOBIN: 33.5 pg (ref 26.0–34.0)
MEAN CORPUSCULAR VOLUME: 94.6 fL (ref 81.0–95.0)
MEAN PLATELET VOLUME: 7.1 fL (ref 7.0–10.0)
MONOCYTES ABSOLUTE COUNT: 0.8 10*9/L (ref 0.1–1.0)
MONOCYTES RELATIVE PERCENT: 12.7 %
NEUTROPHILS ABSOLUTE COUNT: 2.6 10*9/L (ref 1.7–7.7)
NEUTROPHILS RELATIVE PERCENT: 43.4 %
NUCLEATED RED BLOOD CELLS: 0 /100{WBCs} (ref <=4)
PLATELET COUNT: 289 10*9/L (ref 150–450)
RED BLOOD CELL COUNT: 3.16 10*12/L — ABNORMAL LOW (ref 4.32–5.72)
RED CELL DISTRIBUTION WIDTH: 13.6 % (ref 12.0–15.0)
WBC ADJUSTED: 6.1 10*9/L (ref 3.5–10.5)

## 2020-01-17 LAB — BASIC METABOLIC PANEL
ANION GAP: 7 mmol/L (ref 5–14)
BLOOD UREA NITROGEN: 14 mg/dL (ref 9–23)
BUN / CREAT RATIO: 16
CALCIUM: 9.4 mg/dL (ref 8.7–10.4)
CHLORIDE: 93 mmol/L — ABNORMAL LOW (ref 98–107)
CO2: 27.4 mmol/L (ref 20.0–31.0)
CREATININE: 0.9 mg/dL
EGFR CKD-EPI AA MALE: >90 mL/min/{1.73_m2} (ref >=60)
EGFR CKD-EPI NON-AA MALE: >90 mL/min/{1.73_m2} (ref >=60)
GLUCOSE RANDOM: 83 mg/dL (ref 70–179)
POTASSIUM: 3.8 mmol/L (ref 3.4–4.5)
SODIUM: 127 mmol/L — ABNORMAL LOW (ref 135–145)

## 2020-01-17 LAB — MAGNESIUM: MAGNESIUM: 1.6 mg/dL (ref 1.6–2.6)

## 2020-01-17 LAB — PHOSPHORUS: PHOSPHORUS: 4.3 mg/dL (ref 2.4–5.1)
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
## 2020-01-18 ENCOUNTER — Ambulatory Visit: Admit: 2020-01-18 | Discharge: 2020-01-18 | Disposition: A | Discharge status: Home / Self Care | Payer: MEDICAID

## 2020-01-18 DIAGNOSIS — E871 Hypo-osmolality and hyponatremia: Principal | ICD-10-CM

## 2020-01-18 DIAGNOSIS — Z5321 Procedure and treatment not carried out due to patient leaving prior to being seen by health care provider: Principal | ICD-10-CM

## 2020-01-18 NOTE — Unmapped (Signed)
Pt was told by his psychiatrist that he needed to come in due to low sodium. Pt stating he has had issues with his sodium. Pt has no complaints at this time.

## 2020-01-18 NOTE — Unmapped (Signed)
Pt to ED for reports of low sodium.  Was told by PCP to come get a look over.  Denies any symptoms, drinking coffee at triage

## 2020-01-23 NOTE — Unmapped (Deleted)
Subjective:     CC: ***    HPI: Nicholas Rush is a 55 y.o. male here for ***.      ROS:       Review of systems negative unless otherwise noted as per HPI.      I have reviewed past medical, surgical, social and family histories today and updated them in Epic where appropriate.      Current Medications:     Current Outpatient Medications   Medication Sig Dispense Refill   ??? acetaminophen (TYLENOL) 500 MG tablet Take 500 mg by mouth every six (6) hours as needed for pain.     ??? ADALIMUMAB SYRINGE CITRATE FREE 40 MG/0.4 ML Inject the contents of 1 syringe (40 mg) under the skin every 14 days 4 each 1   ??? calcium carbonate (TUMS) 200 mg calcium (500 mg) chewable tablet Chew 2 tablets daily as needed for heartburn.     ??? calcium carbonate/vitamin D3 (CALCIUM 500 + D ORAL) Take 1 tablet by mouth daily.     ??? cetirizine (ZYRTEC) 10 MG tablet Take 10 mg by mouth daily.     ??? diclofenac (VOLTAREN) 75 MG EC tablet Take 1 tablet (75 mg total) by mouth two (2) times a day as needed. 60 tablet 11   ??? divalproex (DEPAKOTE) 500 MG DR tablet Take 2 tablets (1,000 mg total) by mouth nightly. 60 tablet 0   ??? empty container Misc Use as directed to dispose of Humira syringes. 1 each 2   ??? empty container Misc Use as directed 1 each 2   ??? fluticasone propionate (FLONASE) 50 mcg/actuation nasal spray 2 sprays into each nostril daily. 16 g 12   ??? folic acid (FOLVITE) 1 MG tablet Take 1 tablet (1 mg total) by mouth daily. 30 tablet 0   ??? ibuprofen (MOTRIN) 400 MG tablet Take 400 mg by mouth every six (6) hours as needed for pain.     ??? lisinopriL (PRINIVIL,ZESTRIL) 20 MG tablet Take 1 tablet (20 mg total) by mouth daily. 30 tablet 10   ??? multivitamin,tx-iron-minerals (THERA-M) Tab Take 1 tablet by mouth daily.     ??? naltrexone (DEPADE) 50 mg tablet Take 1 tablet (50 mg total) by mouth daily. 30 tablet 0   ??? nicotine (NICODERM CQ) 21 mg/24 hr patch Place 1 patch on the skin daily. Remove old patch before applying new one. 28 patch 0   ??? OLANZapine (ZYPREXA) 10 MG tablet Take 1 tablet (10 mg total) by mouth nightly. 90 tablet 0   ??? OLANZapine (ZYPREXA) 5 MG tablet Take 5 mg by mouth nightly.     ??? omeprazole (PRILOSEC) 20 MG capsule TAKE 1 CAPSULE(20 MG) BY MOUTH EVERY DAY 90 capsule 0   ??? sennosides (SENNA LAX ORAL) Take 1 tablet by mouth daily as needed (constipation).     ??? thiamine (B-1) 100 MG tablet Take 1 tablet (100 mg total) by mouth daily. 100 tablet 3     No current facility-administered medications for this visit.       Allergies:     Tramadol and Hydroxyzine      Objective:     There were no vitals filed for this visit.  There were no vitals filed for this visit.   There is no height or weight on file to calculate BMI.    General: Sitting up comfortably, no acute distress.  Eyes: PERRL, EOMI, anicteric sclera  Ears: Tympanic membranes clear bilaterally.  Mouth/Throat: No  orpharyngeal lesions.  Neck: Supple, no thyromegaly. No cervical LAD.  CV: Regular rhythm and rate. Normal S1 and S2. No murmurs.  Lungs: Clear to auscultation bilaterally. No wheezes or rales.  Abdomen: Normoactive bowel sounds, non-tender, no masses, no hepatomegaly, no splenomegaly.  Skin: No rashes.     Assessment and Plan:     There are no diagnoses linked to this encounter.     No follow-ups on file.      [facility]:     Medication adherence and barriers to the treatment plan have been addressed. Opportunities to optimize healthy behaviors have been discussed. Patient / caregiver voiced understanding.      France Ravens, MD

## 2020-01-26 NOTE — Unmapped (Signed)
[facility] Refill Coordination Note    Specialty Medication(s) to be Shipped:   Inflammatory Disorders: Humira    Other medication(s) to be shipped: No additional medications requested for fill at this time     Nicholas Rush, DOB: 23-Jul-1965  Phone: [phone] (home)       All above HIPAA information was verified with patient's family member, wife.     Was a Nurse, learning disability used for this call? No    Completed refill call assessment today to schedule patient's medication shipment from the [facility] [phone]).       Specialty medication(s) and dose(s) confirmed: Regimen is correct and unchanged.   Changes to medications: Nicholas Rush reports starting the following medications: depakote, stopped raylar,and seroquel  Changes to insurance: No  Questions for the pharmacist: No    Confirmed patient received Welcome Packet with first shipment. The patient will receive a drug information handout for each medication shipped and additional FDA Medication Guides as required.       DISEASE/MEDICATION-SPECIFIC INFORMATION        For patients on injectable medications: Patient currently has 0 doses left.  Next injection is scheduled for 01/10.    SPECIALTY MEDICATION ADHERENCE     Medication Adherence    Patient reported X missed doses in the last month: 0  Specialty Medication: Humira CF 40 mg/0.4 ml  Patient is on additional specialty medications: No  Patient is on more than two specialty medications: No  Any gaps in refill history greater than 2 weeks in the last 3 months: no  Demonstrates understanding of importance of adherence: yes  Informant: spouse  Reliability of informant: reliable  Provider-estimated medication adherence level: good  Patient is at risk for Non-Adherence: No                humira 40/0.4 mg/ml: 0 days of medicine on hand         SHIPPING     Shipping address confirmed in Epic.     Delivery Scheduled: Yes, Expected medication delivery date: 01/10.     Medication will be delivered via Same Day Courier to the prescription address in Epic WAM.    Nicholas Rush   [facility] Specialty Technician

## 2020-01-29 MED FILL — HUMIRA SYRINGE CITRATE FREE 40 MG/0.4 ML: 28 days supply | Qty: 2 | Fill #2

## 2020-02-19 ENCOUNTER — Telehealth: Admit: 2020-02-19 | Discharge: 2020-02-20 | Payer: MEDICAID | Attending: Psychiatry | Primary: Psychiatry

## 2020-02-19 DIAGNOSIS — F1020 Alcohol dependence, uncomplicated: Principal | ICD-10-CM

## 2020-02-19 DIAGNOSIS — F319 Bipolar disorder, unspecified: Principal | ICD-10-CM

## 2020-02-19 MED ORDER — NALTREXONE 50 MG TABLET
ORAL_TABLET | Freq: Every day | ORAL | 0 refills | 30.00000 days | Status: CP
Start: 2020-02-19 — End: 2020-03-08

## 2020-02-19 MED ORDER — OLANZAPINE 10 MG TABLET
ORAL_TABLET | Freq: Every evening | ORAL | 0 refills | 90.00000 days | Status: CP
Start: 2020-02-19 — End: 2020-03-08

## 2020-02-19 MED ORDER — DIVALPROEX 500 MG TABLET,DELAYED RELEASE
ORAL_TABLET | Freq: Every evening | ORAL | 0 refills | 30.00000 days | Status: CP
Start: 2020-02-19 — End: 2020-03-08

## 2020-02-19 NOTE — Unmapped (Signed)
We made the following medication changes today:  Decrease Zyprexa to 10mg  nightly with 5mg  as needed     Follow-up instructions:  -- Please continue taking your medications as prescribed for your mental health.   -- Do not make changes to your medications, including taking more or less than prescribed, unless under the supervision of your physician. Be aware that some medications may make you feel worse if abruptly stopped  -- Please refrain from using illicit substances, as these can affect your mood and could cause anxiety or other concerning symptoms.   -- Seek further medical care for any increase in symptoms or new symptoms such as thoughts of wanting to hurt yourself or hurt others.   -- Please follow-up 2/21 9:00AM, 4/11 9:15AM, 5/16 10:30AM, 6/20 9:15AM     Contact info:  Life-threatening emergencies: Call 911 or go to the nearest ER for medical or psychiatric attention.      Issues that need urgent attention but are not life threatening: Call the clinic outpatient frontdesk at [phone] for assistance.      Non-urgent routine concerns, questions, and refill requests: The best way to reach me is through a MyChart message and I Madigan Rosensteel get back to you within 2 business days.      Regarding appointments:  - If you need to cancel your appointment, we ask that you call [phone] at least 24 hours before your scheduled appointment.  - If for any reason you arrive 15 minutes later than your scheduled appointment time, you may not be seen and your visit may be rescheduled.  - Please remember that we Hayleigh Bawa not automatically reschedule missed appointments.  - If you miss two (2) appointments without letting us know in advance, you Matheau Orona likely be referred to a provider in your community.  - We Delman Goshorn do our best to be on time. Sometimes an emergency Sherline Eberwein arise that might cause your clinician to be late. We Keshonda Monsour try to inform you of this when you check in for your appointment. If you wait more than 15 minutes past your appointment time without such notice, please speak with the front desk staff.     In the event of bad weather, the clinic staff Hobart Marte attempt to contact you, should your appointment need to be rescheduled. Additionally, you can call the Patient Weather Line [phone] for system-wide clinic status     For more information and reminders regarding clinic policies (these were provided when you were admitted to the clinic), please ask the front desk.
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
## 2020-02-22 DIAGNOSIS — E878 Other disorders of electrolyte and fluid balance, not elsewhere classified: Principal | ICD-10-CM

## 2020-02-22 DIAGNOSIS — E222 Syndrome of inappropriate secretion of antidiuretic hormone: Principal | ICD-10-CM

## 2020-02-22 DIAGNOSIS — F319 Bipolar disorder, unspecified: Principal | ICD-10-CM

## 2020-02-22 DIAGNOSIS — R609 Edema, unspecified: Principal | ICD-10-CM

## 2020-02-22 DIAGNOSIS — Z9114 Patient's other noncompliance with medication regimen: Principal | ICD-10-CM

## 2020-02-22 DIAGNOSIS — F1097 Alcohol use, unspecified with alcohol-induced persisting dementia: Principal | ICD-10-CM

## 2020-02-22 DIAGNOSIS — D696 Thrombocytopenia, unspecified: Principal | ICD-10-CM

## 2020-02-22 DIAGNOSIS — D539 Nutritional anemia, unspecified: Principal | ICD-10-CM

## 2020-02-22 DIAGNOSIS — I10 Essential (primary) hypertension: Principal | ICD-10-CM

## 2020-02-22 DIAGNOSIS — Z20822 Contact with and (suspected) exposure to covid-19: Principal | ICD-10-CM

## 2020-02-22 DIAGNOSIS — R4182 Altered mental status, unspecified: Principal | ICD-10-CM

## 2020-02-22 DIAGNOSIS — E871 Hypo-osmolality and hyponatremia: Principal | ICD-10-CM

## 2020-02-22 DIAGNOSIS — R296 Repeated falls: Principal | ICD-10-CM

## 2020-02-22 DIAGNOSIS — F17210 Nicotine dependence, cigarettes, uncomplicated: Principal | ICD-10-CM

## 2020-02-22 DIAGNOSIS — E162 Hypoglycemia, unspecified: Principal | ICD-10-CM

## 2020-02-22 DIAGNOSIS — L4050 Arthropathic psoriasis, unspecified: Principal | ICD-10-CM

## 2020-02-22 DIAGNOSIS — Z560 Unemployment, unspecified: Principal | ICD-10-CM

## 2020-02-22 LAB — COMPREHENSIVE METABOLIC PANEL
ALBUMIN: 4 g/dL (ref 3.4–5.0)
ALKALINE PHOSPHATASE: 94 U/L (ref 46–116)
ALT (SGPT): 32 U/L (ref 10–49)
ANION GAP: 15 mmol/L — ABNORMAL HIGH (ref 5–14)
AST (SGOT): 96 U/L — ABNORMAL HIGH (ref <=34)
BILIRUBIN TOTAL: 0.6 mg/dL (ref 0.3–1.2)
BLOOD UREA NITROGEN: 8 mg/dL — ABNORMAL LOW (ref 9–23)
BUN / CREAT RATIO: 14
CALCIUM: 10.1 mg/dL (ref 8.7–10.4)
CHLORIDE: 76 mmol/L — ABNORMAL LOW (ref 98–107)
CO2: 20 mmol/L (ref 20.0–31.0)
CREATININE: 0.56 mg/dL — ABNORMAL LOW
EGFR CKD-EPI AA MALE: >90 mL/min/{1.73_m2} (ref >=60)
EGFR CKD-EPI NON-AA MALE: >90 mL/min/{1.73_m2} (ref >=60)
GLUCOSE RANDOM: 63 mg/dL — ABNORMAL LOW (ref 70–179)
POTASSIUM: 4.6 mmol/L — ABNORMAL HIGH (ref 3.4–4.5)
PROTEIN TOTAL: 7.8 g/dL (ref 5.7–8.2)
SODIUM: 111 mmol/L — CL (ref 135–145)

## 2020-02-22 LAB — CBC
HEMATOCRIT: 33.3 % — ABNORMAL LOW (ref 41.0–53.0)
HEMOGLOBIN: 11.8 g/dL — ABNORMAL LOW (ref 13.5–17.5)
MEAN CORPUSCULAR HEMOGLOBIN CONC: 35.3 g/dL (ref 31.0–37.0)
MEAN CORPUSCULAR HEMOGLOBIN: 33.9 pg (ref 26.0–34.0)
MEAN CORPUSCULAR VOLUME: 96.1 fL (ref 80.0–100.0)
MEAN PLATELET VOLUME: 10.9 fL — ABNORMAL HIGH (ref 7.0–10.0)
PLATELET COUNT: 161 10*9/L (ref 150–440)
RED BLOOD CELL COUNT: 3.47 10*12/L — ABNORMAL LOW (ref 4.50–5.90)
RED CELL DISTRIBUTION WIDTH: 15.2 % — ABNORMAL HIGH (ref 12.0–15.0)
WBC ADJUSTED: 5.8 10*9/L (ref 4.5–11.0)

## 2020-02-22 LAB — LIPID PANEL
CHOLESTEROL/HDL RATIO SCREEN: 1.5 (ref 1.0–4.5)
CHOLESTEROL: 154 mg/dL (ref <=200)
HDL CHOLESTEROL: 101 mg/dL — ABNORMAL HIGH (ref 40–60)
LDL CHOLESTEROL CALCULATED: 44 mg/dL (ref 40–99)
NON-HDL CHOLESTEROL: 53 mg/dL — ABNORMAL LOW (ref 70–130)
TRIGLYCERIDES: 46 mg/dL (ref 0–150)
VLDL CHOLESTEROL CAL: 9.2 mg/dL — ABNORMAL LOW (ref 12–47)

## 2020-02-22 NOTE — Unmapped (Addendum)
I will call you with lab results.  I will ask our Case Manager Roney Mans to contact you about local transportation  I have ordered an echocardiogram.      Please call [facility] to schedule the echocardiogram  [phone]   [address], [place], [place] [address]

## 2020-02-22 NOTE — Unmapped (Signed)
Labs drawn from patients right AC. One stick. Patient tolerated well.    Roselind Messier, CMA
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
## 2020-02-23 ENCOUNTER — Ambulatory Visit
Admit: 2020-02-23 | Discharge: 2020-03-09 | Disposition: A | Discharge status: Other Acute Inpatient Hospital | Payer: MEDICAID

## 2020-02-23 DIAGNOSIS — F17200 Nicotine dependence, unspecified, uncomplicated: Secondary | ICD-10-CM | POA: Diagnosis present

## 2020-02-23 DIAGNOSIS — Z9114 Patient's other noncompliance with medication regimen: Principal | ICD-10-CM

## 2020-02-23 DIAGNOSIS — E878 Other disorders of electrolyte and fluid balance, not elsewhere classified: Principal | ICD-10-CM

## 2020-02-23 DIAGNOSIS — L4050 Arthropathic psoriasis, unspecified: Principal | ICD-10-CM

## 2020-02-23 DIAGNOSIS — Z20822 Contact with and (suspected) exposure to covid-19: Principal | ICD-10-CM

## 2020-02-23 DIAGNOSIS — D696 Thrombocytopenia, unspecified: Principal | ICD-10-CM

## 2020-02-23 DIAGNOSIS — I10 Essential (primary) hypertension: Principal | ICD-10-CM

## 2020-02-23 DIAGNOSIS — F1097 Alcohol use, unspecified with alcohol-induced persisting dementia: Principal | ICD-10-CM

## 2020-02-23 DIAGNOSIS — F17210 Nicotine dependence, cigarettes, uncomplicated: Principal | ICD-10-CM

## 2020-02-23 DIAGNOSIS — E162 Hypoglycemia, unspecified: Principal | ICD-10-CM

## 2020-02-23 DIAGNOSIS — E871 Hypo-osmolality and hyponatremia: Principal | ICD-10-CM

## 2020-02-23 DIAGNOSIS — E222 Syndrome of inappropriate secretion of antidiuretic hormone: Principal | ICD-10-CM

## 2020-02-23 DIAGNOSIS — F319 Bipolar disorder, unspecified: Principal | ICD-10-CM

## 2020-02-23 DIAGNOSIS — Z560 Unemployment, unspecified: Principal | ICD-10-CM

## 2020-02-23 LAB — COMPREHENSIVE METABOLIC PANEL
ALBUMIN: 3.6 g/dL (ref 3.4–5.0)
ALBUMIN: 3.6 g/dL (ref 3.4–5.0)
ALBUMIN: 3.6 g/dL (ref 3.4–5.0)
ALKALINE PHOSPHATASE: 84 U/L (ref 46–116)
ALKALINE PHOSPHATASE: 88 U/L (ref 46–116)
ALKALINE PHOSPHATASE: 122 U/L — ABNORMAL HIGH (ref 46–116)
ALT (SGPT): 42 U/L (ref 10–49)
ALT (SGPT): 41 U/L (ref 10–49)
ALT (SGPT): 48 U/L (ref 10–49)
ANION GAP: 13 mmol/L (ref 5–14)
ANION GAP: 14 mmol/L (ref 5–14)
ANION GAP: 13 mmol/L (ref 5–14)
AST (SGOT): 109 U/L — ABNORMAL HIGH (ref <=34)
AST (SGOT): 112 U/L — ABNORMAL HIGH (ref <=34)
AST (SGOT): 105 U/L — ABNORMAL HIGH (ref <=34)
BILIRUBIN TOTAL: 0.5 mg/dL (ref 0.3–1.2)
BILIRUBIN TOTAL: 0.6 mg/dL (ref 0.3–1.2)
BILIRUBIN TOTAL: 0.3 mg/dL (ref 0.3–1.2)
BLOOD UREA NITROGEN: 8 mg/dL — ABNORMAL LOW (ref 9–23)
BLOOD UREA NITROGEN: 6 mg/dL — ABNORMAL LOW (ref 9–23)
BLOOD UREA NITROGEN: 12 mg/dL (ref 9–23)
BUN / CREAT RATIO: 12
BUN / CREAT RATIO: 10
BUN / CREAT RATIO: 17
CALCIUM: 9.3 mg/dL (ref 8.7–10.4)
CALCIUM: 9.4 mg/dL (ref 8.7–10.4)
CALCIUM: 9.5 mg/dL (ref 8.7–10.4)
CHLORIDE: 76 mmol/L — ABNORMAL LOW (ref 98–107)
CHLORIDE: 80 mmol/L — ABNORMAL LOW (ref 98–107)
CHLORIDE: 86 mmol/L — ABNORMAL LOW (ref 98–107)
CO2: 22 mmol/L (ref 20.0–31.0)
CO2: 22 mmol/L (ref 20.0–31.0)
CO2: 21.7 mmol/L (ref 20.0–31.0)
CREATININE: 0.67 mg/dL
CREATININE: 0.63 mg/dL
CREATININE: 0.72 mg/dL
EGFR CKD-EPI AA MALE: >90 mL/min/{1.73_m2} (ref >=60)
EGFR CKD-EPI AA MALE: >90 mL/min/{1.73_m2} (ref >=60)
EGFR CKD-EPI AA MALE: >90 mL/min/{1.73_m2} (ref >=60)
EGFR CKD-EPI NON-AA MALE: >90 mL/min/{1.73_m2} (ref >=60)
EGFR CKD-EPI NON-AA MALE: >90 mL/min/{1.73_m2} (ref >=60)
EGFR CKD-EPI NON-AA MALE: >90 mL/min/{1.73_m2} (ref >=60)
GLUCOSE RANDOM: 66 mg/dL — ABNORMAL LOW (ref 70–179)
GLUCOSE RANDOM: 74 mg/dL (ref 70–179)
GLUCOSE RANDOM: 155 mg/dL (ref 70–179)
POTASSIUM: 4.3 mmol/L (ref 3.4–4.5)
POTASSIUM: 4 mmol/L (ref 3.4–4.5)
POTASSIUM: 3.6 mmol/L (ref 3.4–4.5)
PROTEIN TOTAL: 7.3 g/dL (ref 5.7–8.2)
PROTEIN TOTAL: 7.2 g/dL (ref 5.7–8.2)
PROTEIN TOTAL: 7.3 g/dL (ref 5.7–8.2)
SODIUM: 111 mmol/L — CL (ref 135–145)
SODIUM: 116 mmol/L — ABNORMAL LOW (ref 135–145)
SODIUM: 121 mmol/L — ABNORMAL LOW (ref 135–145)

## 2020-02-23 LAB — SODIUM
SODIUM: 117 mmol/L — ABNORMAL LOW (ref 135–145)
SODIUM: 110 mmol/L — CL (ref 135–145)
SODIUM: 116 mmol/L — ABNORMAL LOW (ref 135–145)
SODIUM: 119 mmol/L — ABNORMAL LOW (ref 135–145)
SODIUM: 121 mmol/L — ABNORMAL LOW (ref 135–145)
SODIUM: 120 mmol/L — ABNORMAL LOW (ref 135–145)
SODIUM: 122 mmol/L — ABNORMAL LOW (ref 135–145)
SODIUM: 122 mmol/L — ABNORMAL LOW (ref 135–145)
SODIUM: 121 mmol/L — ABNORMAL LOW (ref 135–145)

## 2020-02-23 LAB — CBC W/ AUTO DIFF
BASOPHILS ABSOLUTE COUNT: 0 10*9/L (ref 0.0–0.1)
BASOPHILS ABSOLUTE COUNT: 0 10*9/L (ref 0.0–0.1)
BASOPHILS RELATIVE PERCENT: 0.2 %
BASOPHILS RELATIVE PERCENT: 0.8 %
EOSINOPHILS ABSOLUTE COUNT: 0 10*9/L (ref 0.0–0.7)
EOSINOPHILS ABSOLUTE COUNT: 0.1 10*9/L (ref 0.0–0.4)
EOSINOPHILS RELATIVE PERCENT: 0.5 %
EOSINOPHILS RELATIVE PERCENT: 0.8 %
HEMATOCRIT: 33.3 % — ABNORMAL LOW (ref 41.0–53.0)
HEMATOCRIT: 34.2 % — ABNORMAL LOW (ref 38.0–50.0)
HEMOGLOBIN: 11.5 g/dL — ABNORMAL LOW (ref 13.5–17.5)
HEMOGLOBIN: 11.9 g/dL — ABNORMAL LOW (ref 13.5–17.5)
LARGE UNSTAINED CELLS: 2 % (ref 0–4)
LYMPHOCYTES ABSOLUTE COUNT: 0.7 10*9/L — ABNORMAL LOW (ref 1.5–5.0)
LYMPHOCYTES ABSOLUTE COUNT: 2.2 10*9/L (ref 0.7–4.0)
LYMPHOCYTES RELATIVE PERCENT: 12.6 %
LYMPHOCYTES RELATIVE PERCENT: 39 %
MEAN CORPUSCULAR HEMOGLOBIN CONC: 34.5 g/dL (ref 31.0–37.0)
MEAN CORPUSCULAR HEMOGLOBIN CONC: 34.8 g/dL (ref 30.0–36.0)
MEAN CORPUSCULAR HEMOGLOBIN: 33.7 pg (ref 26.0–34.0)
MEAN CORPUSCULAR HEMOGLOBIN: 34.1 pg — ABNORMAL HIGH (ref 26.0–34.0)
MEAN CORPUSCULAR VOLUME: 97.7 fL (ref 80.0–100.0)
MEAN CORPUSCULAR VOLUME: 97.9 fL — ABNORMAL HIGH (ref 81.0–95.0)
MEAN PLATELET VOLUME: 7.6 fL (ref 7.0–10.0)
MEAN PLATELET VOLUME: 8.8 fL (ref 7.0–10.0)
MONOCYTES ABSOLUTE COUNT: 0.6 10*9/L (ref 0.2–0.8)
MONOCYTES ABSOLUTE COUNT: 0.9 10*9/L (ref 0.1–1.0)
MONOCYTES RELATIVE PERCENT: 10 %
MONOCYTES RELATIVE PERCENT: 15.1 %
NEUTROPHILS ABSOLUTE COUNT: 2.5 10*9/L (ref 1.7–7.7)
NEUTROPHILS ABSOLUTE COUNT: 4.2 10*9/L (ref 2.0–7.5)
NEUTROPHILS RELATIVE PERCENT: 44.6 %
NEUTROPHILS RELATIVE PERCENT: 74.6 %
NUCLEATED RED BLOOD CELLS: 0 /100{WBCs} (ref <=4)
PLATELET COUNT: 140 10*9/L — ABNORMAL LOW (ref 150–440)
PLATELET COUNT: 150 10*9/L (ref 150–450)
RED BLOOD CELL COUNT: 3.41 10*12/L — ABNORMAL LOW (ref 4.50–5.90)
RED BLOOD CELL COUNT: 3.5 10*12/L — ABNORMAL LOW (ref 4.32–5.72)
RED CELL DISTRIBUTION WIDTH: 14.5 % (ref 12.0–15.0)
RED CELL DISTRIBUTION WIDTH: 14.6 % (ref 12.0–15.0)
WBC ADJUSTED: 5.6 10*9/L (ref 4.5–11.0)
WBC ADJUSTED: 5.7 10*9/L (ref 3.5–10.5)

## 2020-02-23 LAB — URINALYSIS WITH CULTURE REFLEX
BACTERIA: NONE SEEN /HPF
BILIRUBIN UA: NEGATIVE
GLUCOSE UA: NEGATIVE
HYALINE CASTS: 3 /LPF — ABNORMAL HIGH (ref 0–1)
KETONES UA: 40 — AB
LEUKOCYTE ESTERASE UA: NEGATIVE
NITRITE UA: NEGATIVE
PH UA: 6 (ref 5.0–9.0)
PROTEIN UA: NEGATIVE
RBC UA: 3 /HPF (ref <=3)
SPECIFIC GRAVITY UA: 1.015 (ref 1.003–1.030)
SQUAMOUS EPITHELIAL: <1 /HPF (ref 0–5)
UROBILINOGEN UA: 0.2
WBC UA: 2 /HPF (ref <=2)

## 2020-02-23 LAB — CBC
HEMATOCRIT: 35.4 % — ABNORMAL LOW (ref 41.0–53.0)
HEMOGLOBIN: 12.6 g/dL — ABNORMAL LOW (ref 13.5–17.5)
MEAN CORPUSCULAR HEMOGLOBIN CONC: 35.5 g/dL (ref 31.0–37.0)
MEAN CORPUSCULAR HEMOGLOBIN: 35 pg — ABNORMAL HIGH (ref 26.0–34.0)
MEAN CORPUSCULAR VOLUME: 98.6 fL (ref 80.0–100.0)
MEAN PLATELET VOLUME: 9 fL (ref 7.0–10.0)
PLATELET COUNT: 157 10*9/L (ref 150–440)
RED BLOOD CELL COUNT: 3.6 10*12/L — ABNORMAL LOW (ref 4.50–5.90)
RED CELL DISTRIBUTION WIDTH: 15 % (ref 12.0–15.0)
WBC ADJUSTED: 5.1 10*9/L (ref 4.5–11.0)

## 2020-02-23 LAB — BLOOD GAS, VENOUS
BASE EXCESS VENOUS: -0.5 (ref -2.0–2.0)
HCO3 VENOUS: 24 mmol/L (ref 22–27)
O2 SATURATION VENOUS: 96.2 % — ABNORMAL HIGH (ref 40.0–85.0)
PCO2 VENOUS: 33 mmHg — ABNORMAL LOW (ref 40–60)
PH VENOUS: 7.46 — ABNORMAL HIGH (ref 7.32–7.43)
PO2 VENOUS: 76 mmHg — ABNORMAL HIGH (ref 30–55)

## 2020-02-23 LAB — SODIUM, URINE, RANDOM: SODIUM URINE: 20 mmol/L

## 2020-02-23 LAB — TOXICOLOGY SCREEN, URINE
AMPHETAMINE SCREEN URINE: NEGATIVE
BARBITURATE SCREEN URINE: NEGATIVE
BENZODIAZEPINE SCREEN, URINE: NEGATIVE
BUPRENORPHINE, URINE SCREEN: NEGATIVE
CANNABINOID SCREEN URINE: NEGATIVE
COCAINE(METAB.)SCREEN, URINE: NEGATIVE
FENTANYL SCREEN, URINE: NEGATIVE
METHADONE SCREEN, URINE: NEGATIVE
OPIATE SCREEN URINE: NEGATIVE
OXYCODONE SCREEN URINE: NEGATIVE

## 2020-02-23 LAB — OSMOLALITY, SERUM: OSMOLALITY MEASURED: 249 mosm/kg — ABNORMAL LOW (ref 275–295)

## 2020-02-23 LAB — ETHANOL: ETHANOL: <10 mg/dL (ref <=10.0)

## 2020-02-23 LAB — APTT
APTT: 36.4 s (ref 24.9–36.9)
HEPARIN CORRELATION: 0.2

## 2020-02-23 LAB — T3, FREE: T3 FREE: 2.35 pg/mL (ref 2.30–4.20)

## 2020-02-23 LAB — PROTIME-INR
INR: 0.97
PROTIME: 11.4 s (ref 10.3–13.4)

## 2020-02-23 LAB — PHOSPHORUS: PHOSPHORUS: 2.8 mg/dL (ref 2.4–5.1)

## 2020-02-23 LAB — TSH: THYROID STIMULATING HORMONE: 0.445 u[IU]/mL — ABNORMAL LOW (ref 0.550–4.780)

## 2020-02-23 LAB — MAGNESIUM
MAGNESIUM: 1.5 mg/dL — ABNORMAL LOW (ref 1.6–2.6)
MAGNESIUM: 1.8 mg/dL (ref 1.6–2.6)

## 2020-02-23 LAB — T4, FREE: FREE T4: 1.81 ng/dL — ABNORMAL HIGH (ref 0.89–1.76)

## 2020-02-23 LAB — OSMOLALITY, RANDOM URINE: OSMOLALITY URINE: 133 mosm/kg

## 2020-02-23 LAB — VALPROIC ACID LEVEL, TOTAL: VALPROIC ACID TOTAL: 26 ug/mL — ABNORMAL LOW (ref 50.0–100.0)

## 2020-02-23 LAB — LACTATE, VENOUS, WHOLE BLOOD: LACTATE BLOOD VENOUS: 8.1 mmol/L (ref 0.5–1.8)

## 2020-02-23 MED ADMIN — magnesium oxide (MAG-OX) tablet 400 mg: 400 mg | ORAL | @ 20:00:00 | Stop: 2020-02-24

## 2020-02-23 MED ADMIN — sodium chloride 0.9% (NS) bolus 1,000 mL: 1000 mL | INTRAVENOUS | @ 05:00:00 | Stop: 2020-02-23

## 2020-02-23 MED ADMIN — nicotine polacrilex (NICORETTE) gum 4 mg: 4 mg | BUCCAL | @ 17:00:00 | Stop: 2020-03-08

## 2020-02-23 MED ADMIN — nicotine (NICODERM CQ) 21 mg/24 hr patch 1 patch: 1 | TRANSDERMAL | @ 17:00:00 | Stop: 2020-03-08

## 2020-02-23 MED ADMIN — OLANZapine (ZYPREXA) tablet 10 mg: 10 mg | ORAL | @ 06:00:00 | Stop: 2020-02-26

## 2020-02-23 MED ADMIN — DEXTROSE 5 % IV BOLUS 5 % bolus 250 mL: 250 mL | INTRAVENOUS | @ 09:00:00 | Stop: 2020-02-23

## 2020-02-23 MED ADMIN — enoxaparin (LOVENOX) syringe 40 mg: 40 mg | SUBCUTANEOUS | @ 17:00:00 | Stop: 2020-03-08

## 2020-02-23 MED ADMIN — gabapentin (NEURONTIN) capsule 200 mg: 200 mg | ORAL | @ 20:00:00 | Stop: 2020-03-08

## 2020-02-23 MED ADMIN — polyethylene glycol (MIRALAX) packet 17 g: 17 g | ORAL | @ 23:00:00 | Stop: 2020-02-23
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
## 2020-02-23 NOTE — Unmapped (Signed)
Patient in via [facility] for weakness.  Report that sodium is 109.

## 2020-02-23 NOTE — Unmapped (Signed)
Bed: 80-D  Expected date:   Expected time:   Means of arrival:   Comments:  EMS Na 109
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
## 2020-02-23 NOTE — Unmapped (Signed)
Adult Nutrition Assessment Note    Visit Type: RN Consult  Reason for Visit: Per Admission Nutrition Screen (Adult),Have you had a decrease in food intake or appetite?      HPI & PMH:  Per MD note, patient is a 55 y.o. male with a past medical history significant for Bipolar 1, Alcohol use disorder in reportedly in remission, HTN, Psoriasis on adalimumab who presents with acute on chronic hyponatremia.    Anthropometric Data:  Height: 185.4 cm (6' 1)   Admission weight: 83.9 kg (184 lb 15.5 oz)  Last recorded weight: 83.9 kg (184 lb 15.5 oz)  IBW: 83.51 kg  Percent IBW:    BMI: Body mass index is 24.4 kg/m??.   Usual Body Weight: (185-200)# - per patient, unsure the last time he weighed this    Weight history prior to admission: -2.7kg (3.1%) weight loss in ~ 1 1/2 months.   Wt Readings from Last 10 Encounters:   02/23/20 83.9 kg (184 lb 15.5 oz)   01/17/20 83.9 kg (185 lb)   01/15/20 86.6 kg (191 lb)   12/13/19 84.7 kg (186 lb 11.2 oz)   12/11/19 85 kg (187 lb 6.4 oz)   11/30/19 81.3 kg (179 lb 3.7 oz)   07/03/19 81.6 kg (180 lb)   06/09/19 85.9 kg (189 lb 6 oz)   02/20/19 82.6 kg (182 lb 1.6 oz)   09/27/18 82.6 kg (182 lb 3.2 oz)        Weight changes this admission:   Last 5 Recorded Weights    02/23/20 1245   Weight: 83.9 kg (184 lb 15.5 oz)        Nutrition Focused Physical Exam:  Unable to complete at this time due to provider working remotely.      NUTRITIONALLY RELEVANT DATA     Medications:   Nutritionally pertinent medications reviewed and evaluated for potential food and/or medication interactions.   Mag-Ox    Labs:   Nutritionally pertinent labs reviewed and include Na: 110 mmol/L      Nutrition History:   February 23, 2020: Prior to admission: RD spoke with patient this afternoon.  Patient notes a decrease in intake over the last month due to a new medication that makes things not taste quite right.  Patient reports that he avoids marinara sauce, tacos, and spicy things due to altered flavor.  Patient states he routinely eats 2-3 meals per day and will at least try and drink a protein shake in the morning.  Patient reports that he did not eat breakfast or lunch today, but plans to order some mac and cheese.  Patient denies N/V/D.    Allergies, Intolerances, Sensitivities, and/or Cultural/Religious Dietary Restrictions: include garlic - per patient    Current Nutrition:  Oral intake        Nutrition Orders   (From admission, onward)             Start     Ordered    02/23/20 0042  Nutrition Therapy Regular/House; Fluid 1000 ml  Effective now        Question Answer Comment   Nutrition Therapy: Regular/House    Fluid Restriction total / 24h: Fluid 1000 ml        02/23/20 0042                   Nutritional Needs:   Healthy balance of carbohydrate, protein, and fat.       Malnutrition assessment not yet completed at this  time due lack of nutrition history and inability to complete nutrition focused physical exam (NFPE).     GOALS and EVALUATION     ??? Patient to consume 75% or greater of po intake via combination of meals, snacks, and/or oral supplements within 3-5 days.  - New    Motivation, Barriers, and Compliance:  Evaluation of motivation, barriers, and compliance pending at this time due to limited information regarding intake at meals.    NUTRITION ASSESSMENT     ??? Current  nutrition therapy is appropriate although not meeting nutritional  needs at this time due to limited patient intake.   ??? Difficult to determine if patient was meeting estimated needs PTA due to limited information regarding nutrition history.  ??? No significant weight loss noted.    Discharge Planning:   Monitor via CAPP rounds for any discharge planning needs.      NUTRITION INTERVENTIONS and RECOMMENDATION     1. Recommend to continue current diet as ordered: Regular with fluid restriction per MD team  2. Ensure Max at breakfast  3. Correct lytes  4. Consider multivitamin supplementation  5. Monitor po intake and record % meals in Epic  6. Weekly weights    Follow-Up Parameters:   1-2 times per week (and more frequent as indicated)    I appreciate the opportunity to participate in the care of this patient.  Please contact me with any questions.    Terrence Dupont, MS, RD, LDN  Pager: [phone]
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
## 2020-02-23 NOTE — Unmapped (Signed)
Notified by lab that patient's sodium 111 from today's lab draw at clinic. Called patient's home and wife answered and notified of finding. Was recommended to go to the nearest ED ASAP for evaluation of acute on chronic hyponatremia. Wife stated patient was with her at home at the time of phone call and verbalized understanding and agreement to take him to the nearest ED. All questions answered at this time. Notified Dr. Cathie Hoops, [place] as well.

## 2020-02-24 DIAGNOSIS — D696 Thrombocytopenia, unspecified: Principal | ICD-10-CM

## 2020-02-24 DIAGNOSIS — L4050 Arthropathic psoriasis, unspecified: Principal | ICD-10-CM

## 2020-02-24 DIAGNOSIS — F319 Bipolar disorder, unspecified: Principal | ICD-10-CM

## 2020-02-24 DIAGNOSIS — E222 Syndrome of inappropriate secretion of antidiuretic hormone: Principal | ICD-10-CM

## 2020-02-24 DIAGNOSIS — E878 Other disorders of electrolyte and fluid balance, not elsewhere classified: Principal | ICD-10-CM

## 2020-02-24 DIAGNOSIS — F1097 Alcohol use, unspecified with alcohol-induced persisting dementia: Principal | ICD-10-CM

## 2020-02-24 DIAGNOSIS — Z20822 Contact with and (suspected) exposure to covid-19: Principal | ICD-10-CM

## 2020-02-24 DIAGNOSIS — I10 Essential (primary) hypertension: Principal | ICD-10-CM

## 2020-02-24 DIAGNOSIS — E162 Hypoglycemia, unspecified: Principal | ICD-10-CM

## 2020-02-24 DIAGNOSIS — E871 Hypo-osmolality and hyponatremia: Principal | ICD-10-CM

## 2020-02-24 DIAGNOSIS — F17210 Nicotine dependence, cigarettes, uncomplicated: Principal | ICD-10-CM

## 2020-02-24 DIAGNOSIS — Z9114 Patient's other noncompliance with medication regimen: Principal | ICD-10-CM

## 2020-02-24 DIAGNOSIS — Z560 Unemployment, unspecified: Principal | ICD-10-CM

## 2020-02-24 LAB — CBC
HEMATOCRIT: 34.2 % — ABNORMAL LOW (ref 38.0–50.0)
HEMOGLOBIN: 11.8 g/dL — ABNORMAL LOW (ref 13.5–17.5)
MEAN CORPUSCULAR HEMOGLOBIN CONC: 34.4 g/dL (ref 30.0–36.0)
MEAN CORPUSCULAR HEMOGLOBIN: 32.9 pg (ref 26.0–34.0)
MEAN CORPUSCULAR VOLUME: 95.5 fL — ABNORMAL HIGH (ref 81.0–95.0)
MEAN PLATELET VOLUME: 7.5 fL (ref 7.0–10.0)
PLATELET COUNT: 130 10*9/L — ABNORMAL LOW (ref 150–450)
RED BLOOD CELL COUNT: 3.58 10*12/L — ABNORMAL LOW (ref 4.32–5.72)
RED CELL DISTRIBUTION WIDTH: 14.2 % (ref 12.0–15.0)
WBC ADJUSTED: 5.8 10*9/L (ref 3.5–10.5)

## 2020-02-24 LAB — COMPREHENSIVE METABOLIC PANEL
ALBUMIN: 3.5 g/dL (ref 3.4–5.0)
ALKALINE PHOSPHATASE: 92 U/L (ref 46–116)
ALT (SGPT): 48 U/L (ref 10–49)
ANION GAP: 5 mmol/L (ref 5–14)
AST (SGOT): 85 U/L — ABNORMAL HIGH (ref <=34)
BILIRUBIN TOTAL: 0.4 mg/dL (ref 0.3–1.2)
BLOOD UREA NITROGEN: 11 mg/dL (ref 9–23)
BUN / CREAT RATIO: 15
CALCIUM: 9.3 mg/dL (ref 8.7–10.4)
CHLORIDE: 86 mmol/L — ABNORMAL LOW (ref 98–107)
CO2: 30.8 mmol/L (ref 20.0–31.0)
CREATININE: 0.72 mg/dL
EGFR CKD-EPI AA MALE: >90 mL/min/{1.73_m2} (ref >=60)
EGFR CKD-EPI NON-AA MALE: >90 mL/min/{1.73_m2} (ref >=60)
GLUCOSE RANDOM: 109 mg/dL (ref 70–179)
POTASSIUM: 3.3 mmol/L — ABNORMAL LOW (ref 3.4–4.5)
PROTEIN TOTAL: 7.1 g/dL (ref 5.7–8.2)
SODIUM: 122 mmol/L — ABNORMAL LOW (ref 135–145)

## 2020-02-24 LAB — SODIUM
SODIUM: 123 mmol/L — ABNORMAL LOW (ref 135–145)
SODIUM: 126 mmol/L — ABNORMAL LOW (ref 135–145)
SODIUM: 122 mmol/L — ABNORMAL LOW (ref 135–145)
SODIUM: 123 mmol/L — ABNORMAL LOW (ref 135–145)
SODIUM: 123 mmol/L — ABNORMAL LOW (ref 135–145)
SODIUM: 124 mmol/L — ABNORMAL LOW (ref 135–145)
SODIUM: 123 mmol/L — ABNORMAL LOW (ref 135–145)
SODIUM: 125 mmol/L — ABNORMAL LOW (ref 135–145)
SODIUM: 123 mmol/L — ABNORMAL LOW (ref 135–145)

## 2020-02-24 LAB — OSMOLALITY, RANDOM URINE: OSMOLALITY URINE: 314 mosm/kg

## 2020-02-24 LAB — LACTATE, VENOUS, WHOLE BLOOD: LACTATE BLOOD VENOUS: 1.7 mmol/L (ref 0.5–1.8)

## 2020-02-24 LAB — CORTISOL: CORTISOL TOTAL: 14.9 ug/dL

## 2020-02-24 LAB — SODIUM, URINE, RANDOM: SODIUM URINE: 71 mmol/L

## 2020-02-24 LAB — MAGNESIUM: MAGNESIUM: 1.5 mg/dL — ABNORMAL LOW (ref 1.6–2.6)

## 2020-02-24 MED ADMIN — gabapentin (NEURONTIN) capsule 200 mg: 200 mg | ORAL | @ 14:00:00 | Stop: 2020-03-08

## 2020-02-24 MED ADMIN — magnesium oxide (MAG-OX) tablet 400 mg: 400 mg | ORAL | @ 14:00:00 | Stop: 2020-02-24

## 2020-02-24 MED ADMIN — divalproex (DEPAKOTE) DR tablet 1,000 mg: 1000 mg | ORAL | @ 02:00:00 | Stop: 2020-03-08

## 2020-02-24 MED ADMIN — gabapentin (NEURONTIN) capsule 200 mg: 200 mg | ORAL | @ 02:00:00 | Stop: 2020-03-08

## 2020-02-24 MED ADMIN — dextrose 5 % infusion: 75 mL/h | INTRAVENOUS | @ 02:00:00 | Stop: 2020-02-23

## 2020-02-24 MED ADMIN — enoxaparin (LOVENOX) syringe 40 mg: 40 mg | SUBCUTANEOUS | @ 14:00:00 | Stop: 2020-03-08

## 2020-02-24 MED ADMIN — polyethylene glycol (MIRALAX) packet 17 g: 17 g | ORAL | @ 14:00:00 | Stop: 2020-03-08

## 2020-02-24 MED ADMIN — senna (SENOKOT) tablet 1 tablet: 1 | ORAL | @ 02:00:00 | Stop: 2020-03-08

## 2020-02-24 MED ADMIN — gabapentin (NEURONTIN) capsule 200 mg: 200 mg | ORAL | @ 20:00:00 | Stop: 2020-03-08

## 2020-02-24 MED ADMIN — OLANZapine (ZYPREXA) tablet 10 mg: 10 mg | ORAL | @ 02:00:00 | Stop: 2020-02-26

## 2020-02-24 MED ADMIN — polyethylene glycol (MIRALAX) packet 17 g: 17 g | ORAL | @ 02:00:00 | Stop: 2020-03-08

## 2020-02-24 MED ADMIN — nicotine (NICODERM CQ) 21 mg/24 hr patch 1 patch: 1 | TRANSDERMAL | @ 14:00:00 | Stop: 2020-03-08

## 2020-02-24 MED ADMIN — thiamine (B-1) injection 100 mg: 100 mg | INTRAVENOUS | @ 21:00:00 | Stop: 2020-02-26

## 2020-02-24 MED ADMIN — magnesium oxide (MAG-OX) tablet 400 mg: 400 mg | ORAL | @ 15:00:00 | Stop: 2020-02-28

## 2020-02-24 MED ADMIN — sodium chloride (NS) 0.9 % infusion: 100 mL/h | INTRAVENOUS | @ 15:00:00 | Stop: 2020-02-27

## 2020-02-24 MED ADMIN — dextrose 5 % infusion: 75 mL/h | INTRAVENOUS | @ 07:00:00 | Stop: 2020-02-24

## 2020-02-24 MED ADMIN — potassium chloride (KLOR-CON) CR tablet 20 mEq: 20 meq | ORAL | @ 15:00:00 | Stop: 2020-02-25

## 2020-02-24 MED ADMIN — folic acid (FOLVITE) tablet 1 mg: 1 mg | ORAL | @ 21:00:00 | Stop: 2020-03-08

## 2020-02-24 NOTE — Unmapped (Signed)
Care Management  Initial Transition Planning Assessment      CM met with patient in pt room.  Pt/visitors were not wearing hospital provided masks for the duration of the interaction with CM.   CM was wearing hospital provided surgical mask and hospital provided eye protection.  CM was not within 6 foot of the patient/visitors during this interaction.               General  Orientation Level: Oriented X4  Functional level prior to admission: Partially Assisted  Who provides care at home?: Friend  Level of assistance required: Bathing,Other (cooking and personal assistance if having a bad day)  Reason for referral: Discharge Planning    Contact/Decision [facility]  Extended Emergency Contact Information  Primary Emergency Contact: Bisbee,[name]  Address: [address]           [place], [place] [address] [place]  Home Phone: [phone]  Mobile Phone: [phone]  Relation: Spouse  Preferred language: ENGLISH  Interpreter needed? No    Legal Next of Kin / Guardian / POA / Advance Directives       Advance Directive (Medical Treatment)  Does patient have an advance directive covering medical treatment?: Patient has advance directive covering medical treatment, copy not in chart.  Advance directive covering medical treatment not in Chart:: Copy requested from family    Health Care Decision Maker [HCDM] (Medical & Mental Health Treatment)  Healthcare Decision Maker: HCDM documented in the HCDM/Contact Info section.  Information offered on HCDM, Medical & Mental Health advance directives:: Patient declined information.    Advance Directive (Mental Health Treatment)  Does patient have an advance directive covering mental health treatment?: Patient does not have advance directive covering mental health treatment.  Reason patient does not have an advance directive covering mental health treatment:: Patient does not wish to complete one at this time.    Patient Information  Lives with: Family members,Spouse/significant other    Type of Residence: Private residence        Location/Detail: [address] [place] [address] phone [phone]    Support Systems/Concerns: Family Members    Responsibilities/Dependents at home?: No    Home Care services in place prior to admission?: No          Outpatient/Community Resources in place prior to admission: Clinic  Agency detail (Name/Phone #): PCP [facility]    Equipment Currently Used at Home: walker, rolling,shower chair       Currently receiving outpatient dialysis?: No       Financial Information       Need for financial assistance?: No       Social Determinants of Health  Social Determinants of Health were addressed in provider documentation.  Please refer to patient history.  Social Determinants of Health     Tobacco Use: High Risk   ??? Smoking Tobacco Use: Current Every Day Smoker   ??? Smokeless Tobacco Use: Never Used   Alcohol Use: Not on file   Financial Resource Strain: Low Risk    ??? Difficulty of Paying Living Expenses: Not hard at all   Food Insecurity: No Food Insecurity   ??? Worried About Programme researcher, broadcasting/film/video in the Last Year: Never true   ??? Ran Out of Food in the Last Year: Never true   Transportation Needs: No Transportation Needs   ??? Lack of Transportation (Medical): No   ??? Lack of Transportation (Non-Medical): No   Physical Activity: Not on file  Stress: Not on file   Social Connections: Not on file   Intimate Partner Violence: Not on file   Depression: Not on file   Housing/Utilities: Low Risk    ??? Within the past 12 months, have you ever stayed: outside, in a car, in a tent, in an overnight shelter, or temporarily in someone else's home (i.e. couch-surfing)?: No   ??? Are you worried about losing your housing?: No   ??? Within the past 12 months, have you been unable to get utilities (heat, electricity) when it was really needed?: No   Substance Use: Not on file   Health Literacy: Not on file       Discharge Needs Assessment  Concerns to be Addressed: adjustment to diagnosis/illness,decision-making,home safety,medication,cognitive/perceptual,coping/stress    Clinical Risk Factors: Multiple Diagnoses (Chronic),Visual or Hearing Impaired,Functional Limitations    Barriers to taking medications: No    Prior overnight hospital stay or ED visit in last 90 days: No    Readmission Within the Last 30 Days: no previous admission in last 30 days         Anticipated Changes Related to Illness: none    Equipment Needed After Discharge: other (see comments) (TBD)    Discharge Facility/Level of Care Needs:  (home with home health.)    Readmission  Risk of Unplanned Readmission Score: UNPLANNED READMISSION SCORE: 23%  Predictive Model Details          23% (High)  Factor Value    Calculated 02/23/2020 16:03 22% Number of ED visits in last six months 6    [facility] Risk of Unplanned Readmission Model 13% Active NSAID Rx order present     13% Number of hospitalizations in last year 4     12% Number of active Rx orders 27     6% Active antipsychotic Rx order present     6% ECG/EKG order present in last 6 months     5% Encounter of ten days or longer in last year present     5% Diagnosis of electrolyte disorder present     4% Imaging order present in last 6 months     4% Latest hemoglobin low (12.6 g/dL)     3% Diagnosis of deficiency anemia present     3% Age 55     3% Active anticoagulant Rx order present     1% Future appointment scheduled     1% Active ulcer medication Rx order present     0% Current length of stay 0.63 days      Readmitted Within the Last 30 Days? (No if blank)   Patient at risk for readmission?: No    Discharge Plan  Screen findings are: Discharge planning needs identified or anticipated (Comment).,Care Manager reviewed the plan of the patient's care with the Multidisciplinary Team. No discharge planning needs identified at this time. Care Manager will continue to manage plan and monitor patient's progress with the team. (Resume HH with established agency-[facility])    Expected Discharge Date:     Expected Transfer from Critical Care:      Quality data for continuing care services shared with patient and/or representative?: Yes  Patient and/or family were provided with choice of facilities / services that are available and appropriate to meet post hospital care needs?: Yes   List choices in order highest to lowest preferred, if applicable. : [facility]    Initial Assessment complete?: Yes

## 2020-02-24 NOTE — Unmapped (Signed)
Pt stable, no acute events overnight.   Neuro: AOx4. CAM-ICU -. 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 Follows commands and breaks gravity with all extremities. No prn's given overnight. CIWA Q4H, scores have been 0.  Pt had a witnessed seizure prior to shift change.  Head CT completed overnight.    CV: SD status. HR, ST, 100s when awake then 70s when pt is asleep.  Pt was hypertensive when awake with SBP 180s/DBP 90s, however, once asleep he averages SBP 150-160s/DBP 70-80s. Afebrile. Pulses palpable.   Resp: 2L [place], no desaturations noted.   GI/GU: Pt has a condom cath and has adequate UOP. Lst BM PTA, date unknown. Regular diet with adequate PO intake.   Lines: 20g RH, flushed/patent w/intermediate 2-3hr boluses of D5 to help lower Na levels. 20g LFA, flushed/patent and has blood return.   Wounds: Scattered bruising, but no open wounds.   Pt safe/free from falls.     Problem: Adult Inpatient Plan of Care  Goal: Plan of Care Review  Outcome: Progressing  Goal: Patient-Specific Goal (Individualized)  Outcome: Progressing  Goal: Absence of Hospital-Acquired Illness or Injury  Outcome: Progressing  Intervention: Identify and Manage Fall Risk  Recent Flowsheet Documentation  Taken 02/24/2020 0400 by Betsey Holiday, RN  Safety Interventions:   bed alarm   bleeding precautions   fall reduction program maintained   low bed   nonskid shoes/slippers when out of bed  Taken 02/24/2020 0200 by Betsey Holiday, RN  Safety Interventions:   bed alarm   bleeding precautions   fall reduction program maintained   low bed   nonskid shoes/slippers when out of bed  Taken 02/24/2020 0000 by Betsey Holiday, RN  Safety Interventions:   bed alarm   bleeding precautions   fall reduction program maintained   low bed   nonskid shoes/slippers when out of bed  Taken 02/23/2020 2200 by Betsey Holiday, RN  Safety Interventions:   bed alarm   bleeding precautions   fall reduction program maintained   low bed   nonskid shoes/slippers when out of bed  Taken 02/23/2020 2000 by Betsey Holiday, RN  Safety Interventions:   bed alarm   bleeding precautions   fall reduction program maintained   low bed   nonskid shoes/slippers when out of bed  Intervention: Prevent Skin Injury  Recent Flowsheet Documentation  Taken 02/24/2020 0400 by Betsey Holiday, RN  Skin Protection: tubing/devices free from skin contact  Taken 02/24/2020 0200 by Betsey Holiday, RN  Skin Protection: tubing/devices free from skin contact  Taken 02/24/2020 0000 by Betsey Holiday, RN  Skin Protection: tubing/devices free from skin contact  Taken 02/23/2020 2200 by Betsey Holiday, RN  Skin Protection: tubing/devices free from skin contact  Taken 02/23/2020 2000 by Betsey Holiday, RN  Skin Protection: tubing/devices free from skin contact  Intervention: Prevent and Manage VTE (Venous Thromboembolism) Risk  Recent Flowsheet Documentation  Taken 02/24/2020 0400 by Betsey Holiday, RN  Activity Management: bedrest  Taken 02/24/2020 0200 by Betsey Holiday, RN  Activity Management: bedrest  Taken 02/24/2020 0000 by Betsey Holiday, RN  Activity Management: bedrest  Taken 02/23/2020 2200 by Betsey Holiday, RN  Activity Management: bedrest  Taken 02/23/2020 2000 by Betsey Holiday, RN  Activity Management: bedrest  Goal: Optimal Comfort and Wellbeing  Outcome: Progressing  Goal: Readiness for Transition of Care  Outcome: Progressing  Goal: Rounds/Family Conference  Outcome: Progressing     Problem: Self-Care Deficit  Goal: Improved Ability to Complete Activities of Daily Living  Outcome: Progressing     Problem: Behavioral Health Comorbidity  Goal: Maintenance of Behavioral Health Symptom Control  Outcome: Progressing     Problem: Hypertension Comorbidity  Goal: Blood Pressure in Desired Range  Outcome: Progressing     Problem: Osteoarthritis Comorbidity  Goal: Maintenance of Osteoarthritis Symptom Control  Outcome: Progressing  Intervention: Maintain Osteoarthritis Symptom Control  Recent Flowsheet Documentation  Taken 02/24/2020 0400 by Betsey Holiday, RN  Activity Management: bedrest  Taken 02/24/2020 0200 by Betsey Holiday, RN  Activity Management: bedrest  Taken 02/24/2020 0000 by Betsey Holiday, RN  Activity Management: bedrest  Taken 02/23/2020 2200 by Betsey Holiday, RN  Activity Management: bedrest  Taken 02/23/2020 2000 by Betsey Holiday, RN  Activity Management: bedrest     Problem: Pain Chronic (Persistent) (Comorbidity Management)  Goal: Acceptable Pain Control and Functional Ability  Outcome: Progressing     Problem: Behavioral Health Comorbidity  Goal: Maintenance of Behavioral Health Symptom Control  Outcome: Progressing     Problem: Hypertension Comorbidity  Goal: Blood Pressure in Desired Range  Outcome: Progressing     Problem: Fall Injury Risk  Goal: Absence of Fall and Fall-Related Injury  Outcome: Progressing  Intervention: Promote Injury-Free Environment  Recent Flowsheet Documentation  Taken 02/24/2020 0400 by Betsey Holiday, RN  Safety Interventions:   bed alarm   bleeding precautions   fall reduction program maintained   low bed   nonskid shoes/slippers when out of bed  Taken 02/24/2020 0200 by Betsey Holiday, RN  Safety Interventions:   bed alarm   bleeding precautions   fall reduction program maintained   low bed   nonskid shoes/slippers when out of bed  Taken 02/24/2020 0000 by Betsey Holiday, RN  Safety Interventions:   bed alarm   bleeding precautions   fall reduction program maintained   low bed   nonskid shoes/slippers when out of bed  Taken 02/23/2020 2200 by Betsey Holiday, RN  Safety Interventions:   bed alarm   bleeding precautions   fall reduction program maintained   low bed   nonskid shoes/slippers when out of bed  Taken 02/23/2020 2000 by Betsey Holiday, RN  Safety Interventions:   bed alarm   bleeding precautions   fall reduction program maintained   low bed   nonskid shoes/slippers when out of bed     Problem: Seizure, Active Management  Goal: Absence of Seizure/Seizure-Related Injury  Outcome: Progressing
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
## 2020-02-24 NOTE — Unmapped (Signed)
Pt was admitted to the unit form the main ED this morning due to hyponatremia. Sodium was checked every 2 hours with the last check 121. Pt was drowsy and then back to normal. Able to answer questions properly and follow the commends. VSS. On room air. Need assist for ADLS.  Appetite is good. Adequate urine output. Pt had a witnessed  episode of seizure. Rapid was called and pt was able to come back to his baseline mental status and vss 20 minutes after. MD evaluated at the bedside. Labs collected. See the Rapid charting for the details. Chronic pain. Seizure precaution and aspiration. Educated about today's plan of care and daily treatments. Pt cooperative with care, and verbalizes understanding. Pt safety maintained. Will continue to monitor and reassess pt.    Problem: Adult Inpatient Plan of Care  Goal: Plan of Care Review  Outcome: Progressing  Goal: Patient-Specific Goal (Individualized)  Outcome: Progressing  Goal: Absence of Hospital-Acquired Illness or Injury  Outcome: Progressing  Intervention: Identify and Manage Fall Risk  Recent Flowsheet Documentation  Taken 02/23/2020 0855 by Trixie Dredge, RN  Safety Interventions:   fall reduction program maintained   low bed  Intervention: Prevent and Manage VTE (Venous Thromboembolism) Risk  Recent Flowsheet Documentation  Taken 02/23/2020 1800 by Trixie Dredge, RN  Activity Management: activity adjusted per tolerance  Goal: Optimal Comfort and Wellbeing  Outcome: Progressing  Goal: Readiness for Transition of Care  Outcome: Progressing  Goal: Rounds/Family Conference  Outcome: Progressing     Problem: Self-Care Deficit  Goal: Improved Ability to Complete Activities of Daily Living  Outcome: Progressing     Problem: Behavioral Health Comorbidity  Goal: Maintenance of Behavioral Health Symptom Control  Outcome: Progressing     Problem: Hypertension Comorbidity  Goal: Blood Pressure in Desired Range  Outcome: Progressing     Problem: Osteoarthritis Comorbidity  Goal: Maintenance of Osteoarthritis Symptom Control  Outcome: Progressing  Intervention: Maintain Osteoarthritis Symptom Control  Recent Flowsheet Documentation  Taken 02/23/2020 1800 by Trixie Dredge, RN  Activity Management: activity adjusted per tolerance     Problem: Pain Chronic (Persistent) (Comorbidity Management)  Goal: Acceptable Pain Control and Functional Ability  Outcome: Progressing     Problem: Behavioral Health Comorbidity  Goal: Maintenance of Behavioral Health Symptom Control  Outcome: Progressing     Problem: Hypertension Comorbidity  Goal: Blood Pressure in Desired Range  Outcome: Progressing     Problem: Fall Injury Risk  Goal: Absence of Fall and Fall-Related Injury  Outcome: Progressing  Intervention: Promote Injury-Free Environment  Recent Flowsheet Documentation  Taken 02/23/2020 0855 by Trixie Dredge, RN  Safety Interventions:   fall reduction program maintained   low bed     Problem: Seizure, Active Management  Goal: Absence of Seizure/Seizure-Related Injury  Outcome: Progressing

## 2020-02-25 LAB — COMPREHENSIVE METABOLIC PANEL
ALBUMIN: 3.1 g/dL — ABNORMAL LOW (ref 3.4–5.0)
ALKALINE PHOSPHATASE: 90 U/L (ref 46–116)
ALT (SGPT): 39 U/L (ref 10–49)
ANION GAP: 5 mmol/L (ref 5–14)
AST (SGOT): 47 U/L — ABNORMAL HIGH (ref <=34)
BILIRUBIN TOTAL: 0.3 mg/dL (ref 0.3–1.2)
BLOOD UREA NITROGEN: 10 mg/dL (ref 9–23)
BUN / CREAT RATIO: 16
CALCIUM: 8.8 mg/dL (ref 8.7–10.4)
CHLORIDE: 93 mmol/L — ABNORMAL LOW (ref 98–107)
CO2: 26.7 mmol/L (ref 20.0–31.0)
CREATININE: 0.61 mg/dL
EGFR CKD-EPI AA MALE: >90 mL/min/{1.73_m2} (ref >=60)
EGFR CKD-EPI NON-AA MALE: >90 mL/min/{1.73_m2} (ref >=60)
GLUCOSE RANDOM: 105 mg/dL (ref 70–179)
POTASSIUM: 3.8 mmol/L (ref 3.4–4.5)
PROTEIN TOTAL: 6.6 g/dL (ref 5.7–8.2)
SODIUM: 125 mmol/L — ABNORMAL LOW (ref 135–145)

## 2020-02-25 LAB — SODIUM
SODIUM: 126 mmol/L — ABNORMAL LOW (ref 135–145)
SODIUM: 124 mmol/L — ABNORMAL LOW (ref 135–145)
SODIUM: 124 mmol/L — ABNORMAL LOW (ref 135–145)
SODIUM: 126 mmol/L — ABNORMAL LOW (ref 135–145)
SODIUM: 128 mmol/L — ABNORMAL LOW (ref 135–145)
SODIUM: 128 mmol/L — ABNORMAL LOW (ref 135–145)

## 2020-02-25 LAB — CBC
HEMATOCRIT: 31.4 % — ABNORMAL LOW (ref 38.0–50.0)
HEMOGLOBIN: 11.1 g/dL — ABNORMAL LOW (ref 13.5–17.5)
MEAN CORPUSCULAR HEMOGLOBIN CONC: 35.2 g/dL (ref 30.0–36.0)
MEAN CORPUSCULAR HEMOGLOBIN: 33.8 pg (ref 26.0–34.0)
MEAN CORPUSCULAR VOLUME: 96.1 fL — ABNORMAL HIGH (ref 81.0–95.0)
MEAN PLATELET VOLUME: 8.2 fL (ref 7.0–10.0)
PLATELET COUNT: 136 10*9/L — ABNORMAL LOW (ref 150–450)
RED BLOOD CELL COUNT: 3.27 10*12/L — ABNORMAL LOW (ref 4.32–5.72)
RED CELL DISTRIBUTION WIDTH: 14.2 % (ref 12.0–15.0)
WBC ADJUSTED: 5.8 10*9/L (ref 3.5–10.5)

## 2020-02-25 LAB — MAGNESIUM: MAGNESIUM: 1.4 mg/dL — ABNORMAL LOW (ref 1.6–2.6)

## 2020-02-25 MED ADMIN — gabapentin (NEURONTIN) capsule 200 mg: 200 mg | ORAL | @ 02:00:00 | Stop: 2020-03-08

## 2020-02-25 MED ADMIN — nicotine (NICODERM CQ) 21 mg/24 hr patch 1 patch: 1 | TRANSDERMAL | @ 14:00:00 | Stop: 2020-03-08

## 2020-02-25 MED ADMIN — potassium chloride (KLOR-CON) CR tablet 20 mEq: 20 meq | ORAL | @ 14:00:00 | Stop: 2020-02-25

## 2020-02-25 MED ADMIN — gabapentin (NEURONTIN) capsule 200 mg: 200 mg | ORAL | @ 19:00:00 | Stop: 2020-03-08

## 2020-02-25 MED ADMIN — OLANZapine (ZYPREXA) tablet 10 mg: 10 mg | ORAL | @ 02:00:00 | Stop: 2020-02-26

## 2020-02-25 MED ADMIN — potassium chloride (KLOR-CON) CR tablet 20 mEq: 20 meq | ORAL | @ 02:00:00 | Stop: 2020-02-25

## 2020-02-25 MED ADMIN — polyethylene glycol (MIRALAX) packet 17 g: 17 g | ORAL | @ 02:00:00 | Stop: 2020-03-08

## 2020-02-25 MED ADMIN — thiamine (B-1) injection 100 mg: 100 mg | INTRAVENOUS | @ 14:00:00 | Stop: 2020-02-26

## 2020-02-25 MED ADMIN — senna (SENOKOT) tablet 1 tablet: 1 | ORAL | @ 02:00:00 | Stop: 2020-03-08

## 2020-02-25 MED ADMIN — enoxaparin (LOVENOX) syringe 40 mg: 40 mg | SUBCUTANEOUS | @ 14:00:00 | Stop: 2020-03-08

## 2020-02-25 MED ADMIN — magnesium oxide (MAG-OX) tablet 400 mg: 400 mg | ORAL | @ 14:00:00 | Stop: 2020-02-28

## 2020-02-25 MED ADMIN — lisinopriL (PRINIVIL,ZESTRIL) tablet 20 mg: 20 mg | ORAL | @ 21:00:00 | Stop: 2020-02-25

## 2020-02-25 MED ADMIN — gabapentin (NEURONTIN) capsule 200 mg: 200 mg | ORAL | @ 14:00:00 | Stop: 2020-03-08

## 2020-02-25 MED ADMIN — lisinopriL (PRINIVIL,ZESTRIL) tablet 20 mg: 20 mg | ORAL | @ 19:00:00 | Stop: 2020-02-25

## 2020-02-25 MED ADMIN — divalproex (DEPAKOTE) DR tablet 1,000 mg: 1000 mg | ORAL | @ 02:00:00 | Stop: 2020-03-08

## 2020-02-25 MED ADMIN — folic acid (FOLVITE) tablet 1 mg: 1 mg | ORAL | @ 14:00:00 | Stop: 2020-03-08

## 2020-02-25 MED ADMIN — polyethylene glycol (MIRALAX) packet 17 g: 17 g | ORAL | @ 14:00:00 | Stop: 2020-03-08

## 2020-02-25 NOTE — Unmapped (Signed)
Pt is downgrade to step down status. Alert and oriented. Mild ataxia with tremors more likely form the side effects of the psych meds per MD notes. CIWA 3-4. Denies withdrawal symptoms. VSS. On room air. Sodium check every 2 hours and slowly trending up as directed. IVF of NS at 75 ml/hr. Removed the fluid restrictions. Pt has good appetite and states  feeling better today. Continues with chronic back pain and medicated with scheduled gabapentin. Will start the PT/OT. No seizure episodes noted. Continues on seizure and fall precautions.  No s/s of acute distress noted.  Pt educated about today's plan of care and daily treatments. Pt cooperative with care, and verbalizes understanding. Pt safety maintained. Will continue to monitor and reassess pt.    Problem: Adult Inpatient Plan of Care  Goal: Plan of Care Review  Outcome: Progressing  Goal: Patient-Specific Goal (Individualized)  Outcome: Progressing  Goal: Absence of Hospital-Acquired Illness or Injury  Outcome: Progressing  Intervention: Prevent and Manage VTE (Venous Thromboembolism) Risk  Recent Flowsheet Documentation  Taken 02/24/2020 1600 by Trixie Dredge, RN  Activity Management: activity adjusted per tolerance  Taken 02/24/2020 1000 by Trixie Dredge, RN  Activity Management: activity adjusted per tolerance  Taken 02/24/2020 0800 by Trixie Dredge, RN  Activity Management: bedrest  Goal: Optimal Comfort and Wellbeing  Outcome: Progressing  Goal: Readiness for Transition of Care  Outcome: Progressing  Goal: Rounds/Family Conference  Outcome: Progressing     Problem: Self-Care Deficit  Goal: Improved Ability to Complete Activities of Daily Living  Outcome: Progressing     Problem: Behavioral Health Comorbidity  Goal: Maintenance of Behavioral Health Symptom Control  Outcome: Progressing     Problem: Hypertension Comorbidity  Goal: Blood Pressure in Desired Range  Outcome: Progressing     Problem: Osteoarthritis Comorbidity  Goal: Maintenance of Osteoarthritis Symptom Control  Outcome: Progressing  Intervention: Maintain Osteoarthritis Symptom Control  Recent Flowsheet Documentation  Taken 02/24/2020 1600 by Trixie Dredge, RN  Activity Management: activity adjusted per tolerance  Taken 02/24/2020 1000 by Trixie Dredge, RN  Activity Management: activity adjusted per tolerance  Taken 02/24/2020 0800 by Trixie Dredge, RN  Activity Management: bedrest     Problem: Pain Chronic (Persistent) (Comorbidity Management)  Goal: Acceptable Pain Control and Functional Ability  Outcome: Progressing     Problem: Behavioral Health Comorbidity  Goal: Maintenance of Behavioral Health Symptom Control  Outcome: Progressing     Problem: Hypertension Comorbidity  Goal: Blood Pressure in Desired Range  Outcome: Progressing     Problem: Fall Injury Risk  Goal: Absence of Fall and Fall-Related Injury  Outcome: Progressing     Problem: Seizure, Active Management  Goal: Absence of Seizure/Seizure-Related Injury  Outcome: Progressing

## 2020-02-25 NOTE — Unmapped (Signed)
Pt stable this shift, no acute events.    General: VSS w/ exception of hypertension, afebrile, no complaints of pain. CIWA scores = 0.    Neuro: A&Ox4. Limited movement in some extremities - worked w/ PT & OT today, pt is very weak. CAM-ICU negative. L pupil is larger than R pupil - baseline for pt.    Respiratory: lungs diminished. On 2L [place] w/ sats in high 90s.     Cardiac: NSR on tele. HR 60s-70s. SBP 120s-200s, DBP 60s-80s. Pt started on lisinopril for htn. Pulses palpable. +1 edema to LLE.    GI/GU: voiding via condom cath w/ adequate output. No BM this shift. Adequate PO intake.    Lines: #20 PIV to RH and #20 to LFA, both patent/flushed.    Skin: grossly intact w/ scattered bruising.    Will continue to monitor.    Problem: Adult Inpatient Plan of Care  Goal: Plan of Care Review  Outcome: Progressing  Goal: Patient-Specific Goal (Individualized)  Outcome: Progressing  Goal: Absence of Hospital-Acquired Illness or Injury  Outcome: Progressing  Intervention: Identify and Manage Fall Risk  Recent Flowsheet Documentation  Taken 02/25/2020 0800 by Yvonna Alanis, RN  Safety Interventions:  ??? aspiration precautions  ??? bed alarm  ??? bleeding precautions  ??? environmental modification  ??? fall reduction program maintained  ??? infection management  ??? lighting adjusted for tasks/safety  ??? low bed  ??? nonskid shoes/slippers when out of bed  ??? room near unit station  Intervention: Prevent Skin Injury  Recent Flowsheet Documentation  Taken 02/25/2020 0800 by Yvonna Alanis, RN  Skin Protection:  ??? adhesive use limited  ??? incontinence pads utilized  ??? tubing/devices free from skin contact  Intervention: Prevent and Manage VTE (Venous Thromboembolism) Risk  Recent Flowsheet Documentation  Taken 02/25/2020 0800 by Yvonna Alanis, RN  Activity Management: bedrest  Intervention: Prevent Infection  Recent Flowsheet Documentation  Taken 02/25/2020 0800 by Yvonna Alanis, RN  Infection Prevention:  ??? cohorting utilized  ??? environmental surveillance performed  ??? equipment surfaces disinfected  ??? hand hygiene promoted  ??? personal protective equipment utilized  ??? rest/sleep promoted  ??? single patient room provided  ??? visitors restricted/screened  Goal: Optimal Comfort and Wellbeing  Outcome: Progressing  Goal: Readiness for Transition of Care  Outcome: Progressing  Goal: Rounds/Family Conference  Outcome: Progressing     Problem: Self-Care Deficit  Goal: Improved Ability to Complete Activities of Daily Living  Outcome: Progressing     Problem: Behavioral Health Comorbidity  Goal: Maintenance of Behavioral Health Symptom Control  Outcome: Progressing     Problem: Hypertension Comorbidity  Goal: Blood Pressure in Desired Range  Outcome: Progressing     Problem: Osteoarthritis Comorbidity  Goal: Maintenance of Osteoarthritis Symptom Control  Outcome: Progressing  Intervention: Maintain Osteoarthritis Symptom Control  Recent Flowsheet Documentation  Taken 02/25/2020 0800 by Yvonna Alanis, RN  Activity Management: bedrest     Problem: Pain Chronic (Persistent) (Comorbidity Management)  Goal: Acceptable Pain Control and Functional Ability  Outcome: Progressing     Problem: Fall Injury Risk  Goal: Absence of Fall and Fall-Related Injury  Outcome: Progressing  Intervention: Promote Injury-Free Environment  Recent Flowsheet Documentation  Taken 02/25/2020 0800 by Yvonna Alanis, RN  Safety Interventions:  ??? aspiration precautions  ??? bed alarm  ??? bleeding precautions  ??? environmental modification  ??? fall reduction program maintained  ??? infection management  ??? lighting adjusted for tasks/safety  ??? low bed  ??? nonskid shoes/slippers when out of  bed  ??? room near unit station     Problem: Behavioral Health Comorbidity  Goal: Maintenance of Behavioral Health Symptom Control  Outcome: Progressing     Problem: Hypertension Comorbidity  Goal: Blood Pressure in Desired Range  Outcome: Progressing     Problem: Seizure, Active Management  Goal: Absence of Seizure/Seizure-Related Injury  Outcome: Progressing  Intervention: Prevent Seizure-Related Injury  Recent Flowsheet Documentation  Taken 02/25/2020 0800 by Yvonna Alanis, RN  Seizure Precautions:  ??? clutter-free environment maintained  ??? emergency equipment at bedside  ??? side rails padded
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
## 2020-02-25 NOTE — Unmapped (Signed)
Pt stable, no acute events overnight.   Neuro: AOx4. CAM-ICU -. 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 Follows commands and breaks gravity with all extremities. No prn's given overnight. CIWA Q4H, scores have been 0.    CV: SD status. HR, NSR, 70-80s. SBP 140-170s/DBP 70-80s. Afebrile. Pulses palpable.   GI/GU: Pt has a condom cath and has adequate UOP. Lst BM PTA, date unknown. Regular diet with adequate PO intake.   Lines: 20g RH, flushed/patent and a 20g LFA, flushed/patent and has blood return.   Wounds: Scattered bruising, but no open wounds. Pt bathed overnight.   Pt safe/free from falls.     Problem: Adult Inpatient Plan of Care  Goal: Plan of Care Review  Outcome: Progressing  Goal: Patient-Specific Goal (Individualized)  Outcome: Progressing  Goal: Absence of Hospital-Acquired Illness or Injury  Outcome: Progressing  Intervention: Identify and Manage Fall Risk  Recent Flowsheet Documentation  Taken 02/25/2020 0200 by Betsey Holiday, RN  Safety Interventions:   bed alarm   bleeding precautions   fall reduction program maintained   low bed   nonskid shoes/slippers when out of bed  Taken 02/25/2020 0000 by Betsey Holiday, RN  Safety Interventions:   bed alarm   bleeding precautions   fall reduction program maintained   low bed   nonskid shoes/slippers when out of bed  Taken 02/24/2020 2200 by Betsey Holiday, RN  Safety Interventions:   bed alarm   bleeding precautions   fall reduction program maintained   low bed   nonskid shoes/slippers when out of bed  Taken 02/24/2020 2000 by Betsey Holiday, RN  Safety Interventions:   bed alarm   bleeding precautions   fall reduction program maintained   family at bedside   low bed   nonskid shoes/slippers when out of bed  Intervention: Prevent Skin Injury  Recent Flowsheet Documentation  Taken 02/25/2020 0200 by Betsey Holiday, RN  Skin Protection: tubing/devices free from skin contact  Taken 02/25/2020 0000 by Betsey Holiday, RN  Skin Protection: tubing/devices free from skin contact  Taken 02/24/2020 2200 by Betsey Holiday, RN  Skin Protection: tubing/devices free from skin contact  Taken 02/24/2020 2000 by Betsey Holiday, RN  Skin Protection: tubing/devices free from skin contact  Intervention: Prevent and Manage VTE (Venous Thromboembolism) Risk  Recent Flowsheet Documentation  Taken 02/25/2020 0200 by Betsey Holiday, RN  Activity Management: bedrest  Taken 02/25/2020 0000 by Betsey Holiday, RN  Activity Management: bedrest  Taken 02/24/2020 2200 by Betsey Holiday, RN  Activity Management: bedrest  Taken 02/24/2020 2000 by Betsey Holiday, RN  Activity Management: bedrest  Goal: Optimal Comfort and Wellbeing  Outcome: Progressing  Goal: Readiness for Transition of Care  Outcome: Progressing  Goal: Rounds/Family Conference  Outcome: Progressing     Problem: Self-Care Deficit  Goal: Improved Ability to Complete Activities of Daily Living  Outcome: Progressing     Problem: Behavioral Health Comorbidity  Goal: Maintenance of Behavioral Health Symptom Control  Outcome: Progressing     Problem: Hypertension Comorbidity  Goal: Blood Pressure in Desired Range  Outcome: Progressing     Problem: Osteoarthritis Comorbidity  Goal: Maintenance of Osteoarthritis Symptom Control  Outcome: Progressing  Intervention: Maintain Osteoarthritis Symptom Control  Recent Flowsheet Documentation  Taken 02/25/2020 0200 by Betsey Holiday, RN  Activity Management: bedrest  Taken 02/25/2020 0000 by Betsey Holiday,  RN  Activity Management: bedrest  Taken 02/24/2020 2200 by Betsey Holiday, RN  Activity Management: bedrest  Taken 02/24/2020 2000 by Betsey Holiday, RN  Activity Management: bedrest     Problem: Pain Chronic (Persistent) (Comorbidity Management)  Goal: Acceptable Pain Control and Functional Ability  Outcome: Progressing     Problem: Behavioral Health Comorbidity  Goal: Maintenance of Behavioral Health Symptom Control  Outcome: Progressing     Problem: Hypertension Comorbidity  Goal: Blood Pressure in Desired Range  Outcome: Progressing     Problem: Fall Injury Risk  Goal: Absence of Fall and Fall-Related Injury  Outcome: Progressing  Intervention: Promote Injury-Free Environment  Recent Flowsheet Documentation  Taken 02/25/2020 0200 by Betsey Holiday, RN  Safety Interventions:   bed alarm   bleeding precautions   fall reduction program maintained   low bed   nonskid shoes/slippers when out of bed  Taken 02/25/2020 0000 by Betsey Holiday, RN  Safety Interventions:   bed alarm   bleeding precautions   fall reduction program maintained   low bed   nonskid shoes/slippers when out of bed  Taken 02/24/2020 2200 by Betsey Holiday, RN  Safety Interventions:   bed alarm   bleeding precautions   fall reduction program maintained   low bed   nonskid shoes/slippers when out of bed  Taken 02/24/2020 2000 by Betsey Holiday, RN  Safety Interventions:   bed alarm   bleeding precautions   fall reduction program maintained   family at bedside   low bed   nonskid shoes/slippers when out of bed     Problem: Seizure, Active Management  Goal: Absence of Seizure/Seizure-Related Injury  Outcome: Progressing  Intervention: Prevent Seizure-Related Injury  Recent Flowsheet Documentation  Taken 02/25/2020 0200 by Betsey Holiday, RN  Seizure Precautions:   clutter-free environment maintained   emergency equipment at bedside  Taken 02/25/2020 0000 by Betsey Holiday, RN  Seizure Precautions:   clutter-free environment maintained   emergency equipment at bedside  Taken 02/24/2020 2200 by Betsey Holiday, RN  Seizure Precautions:   clutter-free environment maintained   emergency equipment at bedside  Taken 02/24/2020 2000 by Betsey Holiday, RN  Seizure Precautions:   clutter-free environment maintained   emergency equipment at bedside

## 2020-02-25 NOTE — Unmapped (Signed)
OCCUPATIONAL THERAPY  Evaluation (02/25/20 1259)    Patient Name:  Nicholas Rush       Medical Record Number: [id number]   Date of Birth: 1965-08-16  Sex: Male          OT Treatment Diagnosis:  presents with acute on chronic hyponatremia.    Assessment  Problem List: Decreased strength,Decreased endurance,Impaired balance,Decreased mobility,Decreased coordination,Impaired judgement,Decreased safety awareness,Fall Risk,Impaired ADLs  Assessment: 55 yo male with a past medical history significant for Bipolar 1, Alcohol use disorder in reportedly in remission, HTN, Psoriasis on adalimumab who presents with acute on chronic hyponatremia. Pt was educated on the role of OT in the acute care setting. Pt required min A for bed mobility. Pt sat EOB with S to SBA . Pt required max A to donn socks sitting on the EOB. Pt required min A for UB dressing sitting EOB. Pt washed his face with su A. Pt required su A for self feeding. Pt required max A for sit to halfway standing up at EOB. Pt attempted to stand and only stood for 5 sec halfway from standing upright. Pt required mod A for sitting back down on the EOB. Pt sat EOB for 5 minutes for resting. Pt was on 2 liters of O2. Pt's stats were in the high 90's. Pt required min A for sit to supine in bed. Pt required rest breaks thru out OT session. Pt demonstrated a decline in self care and functional mobility. Pt has an AM-PAC score of 15/24 and deficits of 56.46% in self care. Pt is a moderate complexity pt for OT 2/2 clinical judgment and functional level. Dispo: 5xwk LOW.  Today's Interventions: ADL retraining,Balance activities,Bed mobility,Education - Patient,Education - Family / caregiver,Endurance activities,Functional mobility,Functional cognition,Positioning,Range of motion,Transfer training,UE Strength / coordination exercise,Other  Today's Interventions: self care, balance, bed mobility, pt education, staff education, endurance, functional cognition, functional mobility, positioning, ROM, transfers, strength, AM-PAC 15/24    Activity Tolerance During Today's Session  Limited by fatigue    Plan  Planned Frequency of Treatment:  1-2x per day for: 3-4x week       Planned Interventions:  ADL retraining,Balance activities,Education - Patient,Education - Family / caregiver,Conservation,Endurance activities,Functional cognition,Functional mobility,Positioning,Safety education,Transfer training,UE Strength / coordination exercise,Other    Post-Discharge Occupational Therapy Recommendations:  5x weekly,Low intensity   OT DME Recommendations: Defer to post acute    GOALS:   Patient and Family Goals: not stated this date    Long Term Goal #1: Pt to reach maximum LOF in 6 weeks.       Short Term:  Pt to complete BSC transfer with mod A using RW.   Time Frame : 2 weeks  Pt to sat EOB Ily and complete simple grooming tasks with su A.   Time Frame : 1 week  Pt to donn and doff socks with su A only sitting EOB.   Time Frame : 1 week  Pt to transfer from EOB to recliner with mod A for simple ADL's.   Time Frame : 2 weeks           Prognosis:     Positive Indicators:     Barriers to Discharge: Cognitive deficits,Decreased safety awareness,Endurance deficits,Functional strength deficits,Gait instability,Impaired Balance,Inability to safely perform ADLS,Poor insight into deficits    Subjective  Current Status Pt was sitting up in bed.  Prior Functional Status Pt reports he requires min A for self care. min A with lite IADL's, uses a RW inside and uses a  w/c for long distances.       Services patient receives: OT    Patient / Caregiver reports: Charity fundraiser and pt agreed to OT.    Past Medical History:   Diagnosis Date   ??? Anxiety    ??? Bipolar 1 disorder (CMS-HCC)    ??? Depressive disorder    ??? Erectile dysfunction    ??? Esophageal reflux    ??? History of pyloric stenosis    ??? Hypertension    ??? Insomnia    ??? Psoriasis     Dermatologist - Dr. Deloria Lair in [facility]   ??? Seizure (CMS-HCC) 02/20/2019   ??? Tobacco use 1ppd    Social History     Tobacco Use   ??? Smoking status: Current Every Day Smoker     Packs/day: 1.00     Years: 30.00     Pack years: 30.00     Types: Cigarettes   ??? Smokeless tobacco: Never Used   Substance Use Topics   ??? Alcohol use: Yes     Comment: 3 drinks a day.      Past Surgical History:   Procedure Laterality Date   ??? PYLOROMYOTOMY     ??? WISDOM TOOTH EXTRACTION      Family History   Problem Relation Age of Onset   ??? Bipolar disorder Mother    ??? Depression Sister    ??? Alcohol abuse Sister    ??? Alcohol abuse Maternal Aunt    ??? Melanoma Neg Hx    ??? Basal cell carcinoma Neg Hx    ??? Squamous cell carcinoma Neg Hx         Tramadol and Hydroxyzine     Objective Findings  Precautions / Restrictions  Seizure precautions,Falls precautions    Weight Bearing  Non-applicable    Required Braces or Orthoses  Non-applicable    Communication Preference  Verbal,Visual    Pain  Pt reports no pain.    Equipment / Environment  Purewick/Condom catheter,Vascular access (PIV, TLC, Port-a-cath, PICC),Caregiver wearing mask for full session,Patient not wearing mask for full session,Supplemental oxygen,Telemetry    Living Situation  Living Environment: House  Lives With: Significant other,Extended Family  Home Living: One level home,Ramped entrance,Tub/shower unit,Shower chair with back,Hand-held shower hose,Standard height toilet,Raised toilet seat with rails  Equipment available at home: [employer] chair with back     Cognition   Orientation Level:  Oriented x 4   Arousal/Alertness:  Appropriate responses to stimuli   Attention Span:  Appears intact   Memory:  Appears intact   Following Commands:  Follows all commands and directions without difficulty   Safety Judgment:  Decreased awareness of need for assistance   Awareness of Errors:  Decreased awareness of need for assistance   Problem Solving:  Assistance required to identify errors made,Assistance required to generate solutions,Assistance required to implement solutions   Comments:      Vision / Hearing   Vision: Wears glasses for distance only,Wears glasses for reading only     Hearing: No deficit identified         Hand Function:  Right Hand Function: Right hand grip strength, ROM and coordination WNL  Left Hand Function: Left hand grip strength, ROM and coordination WNL  Hand Dominance: Right    Skin Inspection:  Skin Inspection: Bruising,Abrasion    ROM / Strength:  UE ROM/Strength: Left WFL,Right WFL  LE ROM/Strength: Left Impaired/Limited,Right Impaired/Limited  RLE Impairment: Reduced strength  LLE Impairment: Reduced strength    Coordination:  Coordination: Decreased accuracy    Sensation:  RUE Sensation: RUE intact  LUE Sensation: LUE impaired  LUE Sensation Impairment: Numbness,Tingling    Balance:  Sitting balance static S dynamic SBA    standing balance static max A using RW  dynamic total A using RW.    Functional Mobility  Transfer Assistance Needed: Yes  Transfers - Needs Assistance: Total assist  Bed Mobility Assistance Needed: Yes  Bed Mobility - Needs Assistance: Min assist,Physical assistance required,Verbal assist/cues required  Ambulation: NT      ADLs  ADLs: Needs assistance with ADLs  ADLs - Needs Assistance: LB dressing,UB dressing,Toileting,Grooming,Feeding  Feeding - Needs Assistance: Set Up Assist  Grooming - Needs Assistance: Set Up Assist  Toileting - Needs Assistance: Total Assist  UB Dressing - Needs Assistance: Min assist  LB Dressing - Needs Assistance: Max assist  IADLs: NT      Vitals / Orthostatics  At Rest: VSS  With Activity: VSS  Orthostatics: NT      Medical Staff Made Aware: RN aware      Occupational Therapy Session Duration  OT Individual [mins]: 39         I attest that I have reviewed the above information.  Signed: Gladys Damme, OT  Filed 02/25/2020

## 2020-02-26 DIAGNOSIS — I10 Essential (primary) hypertension: Principal | ICD-10-CM

## 2020-02-26 DIAGNOSIS — E222 Syndrome of inappropriate secretion of antidiuretic hormone: Principal | ICD-10-CM

## 2020-02-26 DIAGNOSIS — F1097 Alcohol use, unspecified with alcohol-induced persisting dementia: Principal | ICD-10-CM

## 2020-02-26 DIAGNOSIS — E871 Hypo-osmolality and hyponatremia: Principal | ICD-10-CM

## 2020-02-26 DIAGNOSIS — F17210 Nicotine dependence, cigarettes, uncomplicated: Principal | ICD-10-CM

## 2020-02-26 DIAGNOSIS — F319 Bipolar disorder, unspecified: Principal | ICD-10-CM

## 2020-02-26 DIAGNOSIS — E878 Other disorders of electrolyte and fluid balance, not elsewhere classified: Principal | ICD-10-CM

## 2020-02-26 DIAGNOSIS — Z9114 Patient's other noncompliance with medication regimen: Principal | ICD-10-CM

## 2020-02-26 DIAGNOSIS — D696 Thrombocytopenia, unspecified: Principal | ICD-10-CM

## 2020-02-26 DIAGNOSIS — E162 Hypoglycemia, unspecified: Principal | ICD-10-CM

## 2020-02-26 DIAGNOSIS — Z560 Unemployment, unspecified: Principal | ICD-10-CM

## 2020-02-26 DIAGNOSIS — L4050 Arthropathic psoriasis, unspecified: Principal | ICD-10-CM

## 2020-02-26 DIAGNOSIS — Z20822 Contact with and (suspected) exposure to covid-19: Principal | ICD-10-CM

## 2020-02-26 LAB — CBC
HEMATOCRIT: 31 % — ABNORMAL LOW (ref 38.0–50.0)
HEMOGLOBIN: 10.8 g/dL — ABNORMAL LOW (ref 13.5–17.5)
MEAN CORPUSCULAR HEMOGLOBIN CONC: 34.7 g/dL (ref 30.0–36.0)
MEAN CORPUSCULAR HEMOGLOBIN: 33.6 pg (ref 26.0–34.0)
MEAN CORPUSCULAR VOLUME: 96.8 fL — ABNORMAL HIGH (ref 81.0–95.0)
MEAN PLATELET VOLUME: 8 fL (ref 7.0–10.0)
PLATELET COUNT: 135 10*9/L — ABNORMAL LOW (ref 150–450)
RED BLOOD CELL COUNT: 3.2 10*12/L — ABNORMAL LOW (ref 4.32–5.72)
RED CELL DISTRIBUTION WIDTH: 14.4 % (ref 12.0–15.0)
WBC ADJUSTED: 5.8 10*9/L (ref 3.5–10.5)

## 2020-02-26 LAB — COMPREHENSIVE METABOLIC PANEL
ALBUMIN: 2.9 g/dL — ABNORMAL LOW (ref 3.4–5.0)
ALKALINE PHOSPHATASE: 102 U/L (ref 46–116)
ALT (SGPT): 29 U/L (ref 10–49)
ANION GAP: 4 mmol/L — ABNORMAL LOW (ref 5–14)
AST (SGOT): 30 U/L (ref <=34)
BILIRUBIN TOTAL: 0.2 mg/dL — ABNORMAL LOW (ref 0.3–1.2)
BLOOD UREA NITROGEN: 10 mg/dL (ref 9–23)
BUN / CREAT RATIO: 17
CALCIUM: 8.6 mg/dL — ABNORMAL LOW (ref 8.7–10.4)
CHLORIDE: 98 mmol/L (ref 98–107)
CO2: 25.3 mmol/L (ref 20.0–31.0)
CREATININE: 0.6 mg/dL
EGFR CKD-EPI AA MALE: >90 mL/min/{1.73_m2} (ref >=60)
EGFR CKD-EPI NON-AA MALE: >90 mL/min/{1.73_m2} (ref >=60)
GLUCOSE RANDOM: 99 mg/dL (ref 70–179)
POTASSIUM: 3.9 mmol/L (ref 3.4–4.5)
PROTEIN TOTAL: 6.3 g/dL (ref 5.7–8.2)
SODIUM: 127 mmol/L — ABNORMAL LOW (ref 135–145)

## 2020-02-26 LAB — SODIUM
SODIUM: 127 mmol/L — ABNORMAL LOW (ref 135–145)
SODIUM: 127 mmol/L — ABNORMAL LOW (ref 135–145)
SODIUM: 130 mmol/L — ABNORMAL LOW (ref 135–145)

## 2020-02-26 LAB — MAGNESIUM: MAGNESIUM: 1.4 mg/dL — ABNORMAL LOW (ref 1.6–2.6)

## 2020-02-26 MED ADMIN — OLANZapine (ZYPREXA) tablet 10 mg: 10 mg | ORAL | @ 01:00:00 | Stop: 2020-02-26

## 2020-02-26 MED ADMIN — naltrexone (DEPADE) tablet 50 mg: 50 mg | ORAL | @ 18:00:00 | Stop: 2020-03-08

## 2020-02-26 MED ADMIN — polyethylene glycol (MIRALAX) packet 17 g: 17 g | ORAL | @ 14:00:00 | Stop: 2020-03-08

## 2020-02-26 MED ADMIN — sodium chloride (NS) 0.9 % infusion: 100 mL/h | INTRAVENOUS | @ 23:00:00 | Stop: 2020-02-27

## 2020-02-26 MED ADMIN — lisinopriL (PRINIVIL,ZESTRIL) tablet 40 mg: 40 mg | ORAL | @ 14:00:00 | Stop: 2020-03-08

## 2020-02-26 MED ADMIN — magnesium oxide (MAG-OX) tablet 400 mg: 400 mg | ORAL | @ 14:00:00 | Stop: 2020-02-28

## 2020-02-26 MED ADMIN — gabapentin (NEURONTIN) capsule 200 mg: 200 mg | ORAL | @ 14:00:00 | Stop: 2020-03-08

## 2020-02-26 MED ADMIN — magnesium sulfate 2gm/50mL IVPB: 2 g | INTRAVENOUS | @ 16:00:00 | Stop: 2020-02-26

## 2020-02-26 MED ADMIN — folic acid (FOLVITE) tablet 1 mg: 1 mg | ORAL | @ 14:00:00 | Stop: 2020-03-08

## 2020-02-26 MED ADMIN — polyethylene glycol (MIRALAX) packet 17 g: 17 g | ORAL | @ 01:00:00 | Stop: 2020-03-08

## 2020-02-26 MED ADMIN — divalproex (DEPAKOTE) DR tablet 1,000 mg: 1000 mg | ORAL | @ 01:00:00 | Stop: 2020-03-08

## 2020-02-26 MED ADMIN — thiamine (B-1) injection 100 mg: 100 mg | INTRAVENOUS | @ 14:00:00 | Stop: 2020-02-26

## 2020-02-26 MED ADMIN — gabapentin (NEURONTIN) capsule 200 mg: 200 mg | ORAL | @ 18:00:00 | Stop: 2020-03-08

## 2020-02-26 MED ADMIN — nicotine (NICODERM CQ) 21 mg/24 hr patch 1 patch: 1 | TRANSDERMAL | @ 14:00:00 | Stop: 2020-03-08

## 2020-02-26 MED ADMIN — magnesium oxide (MAG-OX) tablet 400 mg: 400 mg | ORAL | @ 16:00:00 | Stop: 2020-02-28

## 2020-02-26 MED ADMIN — senna (SENOKOT) tablet 1 tablet: 1 | ORAL | @ 01:00:00 | Stop: 2020-03-08

## 2020-02-26 MED ADMIN — gabapentin (NEURONTIN) capsule 200 mg: 200 mg | ORAL | @ 01:00:00 | Stop: 2020-03-08

## 2020-02-26 MED ADMIN — enoxaparin (LOVENOX) syringe 40 mg: 40 mg | SUBCUTANEOUS | @ 14:00:00 | Stop: 2020-03-08

## 2020-02-26 NOTE — Unmapped (Signed)
Problem: Adult Inpatient Plan of Care  Goal: Plan of Care Review  Outcome: Progressing     Problem: Gas Exchange Impaired  Goal: Optimal Gas Exchange  Outcome: Progressing  Patient received and remained on nasal cannula with no complications noted this shift.

## 2020-02-26 NOTE — Unmapped (Signed)
Additional consult received- pt evaluated 02/25/2020 and recommended 5x LOW. Please see evaluation for details.     Thank you!!!

## 2020-02-26 NOTE — Unmapped (Signed)
Afebrile, vitals stable. Pt is alert and oriented X 4. Denies pain. Tolerating regular diet. Voiding with condom cath. No BM, on bowel regimen. Awaiting PT consult per patient.   Problem: Adult Inpatient Plan of Care  Goal: Plan of Care Review  Outcome: Progressing  Goal: Patient-Specific Goal (Individualized)  Outcome: Progressing  Goal: Absence of Hospital-Acquired Illness or Injury  Outcome: Progressing  Intervention: Identify and Manage Fall Risk  Recent Flowsheet Documentation  Taken 02/26/2020 0800 by Carin Hock, RN  Safety Interventions:   bed alarm   fall reduction program maintained   low bed  Intervention: Prevent Skin Injury  Recent Flowsheet Documentation  Taken 02/26/2020 0800 by Carin Hock, RN  Skin Protection:   adhesive use limited   incontinence pads utilized   transparent dressing maintained   tubing/devices free from skin contact  Intervention: Prevent and Manage VTE (Venous Thromboembolism) Risk  Recent Flowsheet Documentation  Taken 02/26/2020 1200 by Carin Hock, RN  Activity Management: bedrest  Taken 02/26/2020 1000 by Carin Hock, RN  Activity Management: bedrest  Taken 02/26/2020 0800 by Carin Hock, RN  Activity Management: bedrest  Range of Motion: Bilateral Upper and Lower Extremities  Intervention: Prevent Infection  Recent Flowsheet Documentation  Taken 02/26/2020 0800 by Carin Hock, RN  Infection Prevention:   hand hygiene promoted   personal protective equipment utilized   rest/sleep promoted   single patient room provided   visitors restricted/screened  Goal: Optimal Comfort and Wellbeing  Outcome: Progressing  Goal: Readiness for Transition of Care  Outcome: Progressing  Goal: Rounds/Family Conference  Outcome: Progressing
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
## 2020-02-26 NOTE — Unmapped (Signed)
PHYSICAL THERAPY  Evaluation (02/25/20 1438)     Patient Name:  Nicholas Rush       Medical Record Number: [id number]   Date of Birth: [birth year]  Sex: Male            Treatment Diagnosis: s/p admission with acute on chronic hyponatremia    Activity Tolerance: Tolerated treatment well    ASSESSMENT  Problem List: Decreased strength,Decreased mobility,Impaired balance,Impaired judgement,Decreased safety awareness,Fall Risk,Core weakness,Postural Weakness,Decreased coordination     Assessment : 55 y/o pleasant/motivated male who presents with deficits in gait, functional activities, conditioning, and safety - will benefit from skilled acute PT interventions; PT evaluation complexity Low - AMPAC 14/24 exhibiting 53.86% impairment in functional mobility; recommend 5x low to maximize strength and safety     Today's Interventions: PT evaluation, gait training, functional activity training, thera-ex (AP, QS, GS, HS), patient education regarding consistent thera-ex/mobility                    Clinical Decision Making: Low     PLAN  Planned Frequency of Treatment:  1-2x per day for: 3-4x week      Planned Interventions: Balance activities,Education - Patient,Functional mobility,Gait training,Postural re-education,Therapeutic exercise,Therapeutic activity,Transfer training    Post-Discharge Physical Therapy Recommendations:  5x weekly,Low intensity    PT DME Recommendations: Defer to post acute           Goals:   Patient and Family Goals: Get well and return home    Long Term Goal #1: Defer to post acute       SHORT GOAL #1: Supine <> sit supervision              Time Frame : 2 weeks  SHORT GOAL #2: Sit <> stand with RW CGA              Time Frame : 2 weeks  SHORT GOAL #3: Ambulation with RW x 34ft CGA              Time Frame : 2 weeks                                        Prognosis:  Good  Positive Indicators: Motivated patient, good family support  Barriers to Discharge: Functional strength deficits,Decreased safety awareness,Gait instability,Impaired Balance    SUBJECTIVE  Equipment / Environment: Purewick/Condom catheter,Vascular access (PIV, TLC, Port-a-cath, PICC),Caregiver wearing mask for full session,Patient not wearing mask for full session,Supplemental oxygen,Telemetry  Patient reports: Yes, I can get up. I already worked with OT today.  Current Functional Status: Received patient supine in bed and concluded PT evaluation with patient back in bed, call button accessible/RN notified  Services patient receives: PT,OT  Prior Functional Status: Independent home ambulation with rollator  Equipment available at home: [facility] chair with back     Past Medical History:   Diagnosis Date   ??? Anxiety    ??? Bipolar 1 disorder (CMS-HCC)    ??? Depressive disorder    ??? Erectile dysfunction    ??? Esophageal reflux    ??? History of pyloric stenosis    ??? Hypertension    ??? Insomnia    ??? Psoriasis     Dermatologist - Dr. Deloria Lair in [facility]   ??? Seizure (CMS-HCC) 02/20/2019   ??? Tobacco use     1ppd    Social History  Tobacco Use   ??? Smoking status: Current Every Day Smoker     Packs/day: 1.00     Years: 30.00     Pack years: 30.00     Types: Cigarettes   ??? Smokeless tobacco: Never Used   Substance Use Topics   ??? Alcohol use: Yes     Comment: 3 drinks a day.      Past Surgical History:   Procedure Laterality Date   ??? PYLOROMYOTOMY     ??? WISDOM TOOTH EXTRACTION      Family History   Problem Relation Age of Onset   ??? Bipolar disorder Mother    ??? Depression Sister    ??? Alcohol abuse Sister    ??? Alcohol abuse Maternal Aunt    ??? Melanoma Neg Hx    ??? Basal cell carcinoma Neg Hx    ??? Squamous cell carcinoma Neg Hx         Allergies: Tramadol and Hydroxyzine                Objective Findings  Precautions / Restrictions  Precautions: Seizure precautions,Falls precautions  Weight Bearing Status: Non-applicable  Required Braces or Orthoses: Non-applicable    Communication Preference: Verbal   Pain Comments: Denies  Medical Tests / Procedures: Order verification, H&P, Progress notes  Equipment / Environment: Purewick/Condom catheter,Supplemental oxygen,Telemetry,Vascular access (PIV, TLC, Port-a-cath, PICC),Caregiver wearing mask for full session,Patient not wearing mask for full session    At Rest: VSS per telemetry     Orthostatics: Asymptomatic  Airway Clearance: Mobility    Living Situation  Living Environment: House  Lives With: Significant other,Extended Family  Home Living: One level home,Ramped entrance,Tub/shower unit,Shower chair with back,Hand-held shower hose,Standard height toilet,Raised toilet seat with rails     Cognition  Cognition comment: Alert/oriented to self, place - unsure of time and situation  Visual / Perception status  Visual/Perception: Within Functional Limits    UE ROM / Strength  UE ROM/Strength: Left WFL,Right WFL  LE ROM / Strength  LE ROM/Strength: Right WFL,Left WFL    Motor/ Sensory/ Neuro  Coordination: Not tested  Proprioception: Not tested  Sensation: WFL  Balance: Impaired dynamic standing balance,Impaired static standing balance,Loss of balance with ambulation  Balance comment: Sitting EOB CGA; standing with RW mod A  Posture: Not tested     Bed Mobility: Supine <> sit min A, verbal instructions for body positioning/scooting in bed    Transfers  Transfers: Sit to Stand  Sit to Stand assistance level: Verbal Cues,26% - 50% physical assistance  Sit to Stand comments: Sit <> stand with RW mod A, verbal instructions for feet positioning/BOS, postural correction in standing, hand placement; performed 3 trials - during 1st 2 trials, patient lost balance immediately upon standing and descended onto EOB, able to maintain balance on 3rd trial using RW support     Gait  Level of Assistance: Moderate assist, patient does 50-74%  Assistive Device: Front wheel walker  Distance Ambulated (ft): 5 ft (Side-stepping EOB)  Gait: Ambulation with RW x 45ft side-stepping EOB, mod A, verbal instructions for postural correction, RW safety/control, step length/BOS    Stairs: Not formally assessed               Physical Therapy Session Duration  PT Individual [mins]: 40    Medical Staff Made Aware: RN Dani    I attest that I have reviewed the above information.  SignedVanita Ingles, PT  Filed 02/25/2020

## 2020-02-27 LAB — COMPREHENSIVE METABOLIC PANEL
ALBUMIN: 3 g/dL — ABNORMAL LOW (ref 3.4–5.0)
ALKALINE PHOSPHATASE: 72 U/L (ref 46–116)
ALT (SGPT): 26 U/L (ref 10–49)
ANION GAP: 7 mmol/L (ref 5–14)
AST (SGOT): 21 U/L (ref <=34)
BILIRUBIN TOTAL: 0.3 mg/dL (ref 0.3–1.2)
BLOOD UREA NITROGEN: 8 mg/dL — ABNORMAL LOW (ref 9–23)
BUN / CREAT RATIO: 12
CALCIUM: 9.1 mg/dL (ref 8.7–10.4)
CHLORIDE: 95 mmol/L — ABNORMAL LOW (ref 98–107)
CO2: 25.1 mmol/L (ref 20.0–31.0)
CREATININE: 0.66 mg/dL
EGFR CKD-EPI AA MALE: >90 mL/min/{1.73_m2} (ref >=60)
EGFR CKD-EPI NON-AA MALE: >90 mL/min/{1.73_m2} (ref >=60)
GLUCOSE RANDOM: 107 mg/dL (ref 70–179)
POTASSIUM: 4.5 mmol/L (ref 3.4–4.5)
PROTEIN TOTAL: 6.6 g/dL (ref 5.7–8.2)
SODIUM: 127 mmol/L — ABNORMAL LOW (ref 135–145)

## 2020-02-27 LAB — CBC
HEMATOCRIT: 30.5 % — ABNORMAL LOW (ref 38.0–50.0)
HEMOGLOBIN: 10.8 g/dL — ABNORMAL LOW (ref 13.5–17.5)
MEAN CORPUSCULAR HEMOGLOBIN CONC: 35.4 g/dL (ref 30.0–36.0)
MEAN CORPUSCULAR HEMOGLOBIN: 34.5 pg — ABNORMAL HIGH (ref 26.0–34.0)
MEAN CORPUSCULAR VOLUME: 97.5 fL — ABNORMAL HIGH (ref 81.0–95.0)
MEAN PLATELET VOLUME: 8.2 fL (ref 7.0–10.0)
PLATELET COUNT: 136 10*9/L — ABNORMAL LOW (ref 150–450)
RED BLOOD CELL COUNT: 3.13 10*12/L — ABNORMAL LOW (ref 4.32–5.72)
RED CELL DISTRIBUTION WIDTH: 14.3 % (ref 12.0–15.0)
WBC ADJUSTED: 7.1 10*9/L (ref 3.5–10.5)

## 2020-02-27 LAB — MAGNESIUM: MAGNESIUM: 1.7 mg/dL (ref 1.6–2.6)

## 2020-02-27 MED ORDER — LISINOPRIL 40 MG TABLET
ORAL_TABLET | Freq: Every day | ORAL | 0 refills | 30 days
Start: 2020-02-27 — End: 2020-03-28

## 2020-02-27 MED ORDER — NICOTINE 21 MG/24 HR DAILY TRANSDERMAL PATCH
MEDICATED_PATCH | Freq: Every day | TRANSDERMAL | 0 refills | 28 days
Start: 2020-02-27 — End: ?

## 2020-02-27 MED ADMIN — divalproex (DEPAKOTE) DR tablet 1,000 mg: 1000 mg | ORAL | @ 02:00:00 | Stop: 2020-03-08

## 2020-02-27 MED ADMIN — naltrexone (DEPADE) tablet 50 mg: 50 mg | ORAL | @ 14:00:00 | Stop: 2020-03-08

## 2020-02-27 MED ADMIN — folic acid (FOLVITE) tablet 1 mg: 1 mg | ORAL | @ 14:00:00 | Stop: 2020-03-08

## 2020-02-27 MED ADMIN — lisinopriL (PRINIVIL,ZESTRIL) tablet 40 mg: 40 mg | ORAL | @ 14:00:00 | Stop: 2020-03-08

## 2020-02-27 MED ADMIN — gabapentin (NEURONTIN) capsule 200 mg: 200 mg | ORAL | @ 14:00:00 | Stop: 2020-03-08

## 2020-02-27 MED ADMIN — nicotine (NICODERM CQ) 21 mg/24 hr patch 1 patch: 1 | TRANSDERMAL | @ 14:00:00 | Stop: 2020-03-08

## 2020-02-27 MED ADMIN — OLANZapine (ZYPREXA) tablet 15 mg: 15 mg | ORAL | @ 02:00:00 | Stop: 2020-03-08

## 2020-02-27 MED ADMIN — senna (SENOKOT) tablet 1 tablet: 1 | ORAL | @ 02:00:00 | Stop: 2020-03-08

## 2020-02-27 MED ADMIN — magnesium oxide (MAG-OX) tablet 400 mg: 400 mg | ORAL | @ 14:00:00 | Stop: 2020-02-28

## 2020-02-27 MED ADMIN — sodium chloride (NS) 0.9 % infusion: 100 mL/h | INTRAVENOUS | @ 08:00:00 | Stop: 2020-02-27

## 2020-02-27 MED ADMIN — gabapentin (NEURONTIN) capsule 200 mg: 200 mg | ORAL | @ 20:00:00 | Stop: 2020-03-08

## 2020-02-27 MED ADMIN — polyethylene glycol (MIRALAX) packet 17 g: 17 g | ORAL | @ 14:00:00 | Stop: 2020-03-08

## 2020-02-27 MED ADMIN — enoxaparin (LOVENOX) syringe 40 mg: 40 mg | SUBCUTANEOUS | @ 14:00:00 | Stop: 2020-03-08

## 2020-02-27 MED ADMIN — gabapentin (NEURONTIN) capsule 200 mg: 200 mg | ORAL | @ 02:00:00 | Stop: 2020-03-08

## 2020-02-27 NOTE — Unmapped (Signed)
VSS, afebrile. Pt is alert and oriented X 4. Tolerating regular diet. Voiding in condom cath. No BM, on bowel regimen.  Denies pain this shift. States he does not want to go to SNF/rehab for PT but would go to outpatient PT, that he has 2 adult children and a fiance to help him. SW aware and spoke with patient.     Problem: Adult Inpatient Plan of Care  Goal: Plan of Care Review  02/26/2020 1700 by Carin Hock, RN  Outcome: Progressing  02/26/2020 1444 by Carin Hock, RN  Outcome: Progressing  Goal: Patient-Specific Goal (Individualized)  02/26/2020 1700 by Carin Hock, RN  Outcome: Progressing  02/26/2020 1444 by Carin Hock, RN  Outcome: Progressing  Goal: Absence of Hospital-Acquired Illness or Injury  02/26/2020 1700 by Carin Hock, RN  Outcome: Progressing  02/26/2020 1444 by Carin Hock, RN  Outcome: Progressing  Intervention: Identify and Manage Fall Risk  Recent Flowsheet Documentation  Taken 02/26/2020 0800 by Carin Hock, RN  Safety Interventions:   bed alarm   fall reduction program maintained   low bed  Intervention: Prevent Skin Injury  Recent Flowsheet Documentation  Taken 02/26/2020 0800 by Carin Hock, RN  Skin Protection:   adhesive use limited   incontinence pads utilized   transparent dressing maintained   tubing/devices free from skin contact  Intervention: Prevent and Manage VTE (Venous Thromboembolism) Risk  Recent Flowsheet Documentation  Taken 02/26/2020 1600 by Carin Hock, RN  Activity Management: bedrest  Taken 02/26/2020 1200 by Carin Hock, RN  Activity Management: bedrest  Taken 02/26/2020 1000 by Carin Hock, RN  Activity Management: bedrest  Taken 02/26/2020 0800 by Carin Hock, RN  Activity Management: bedrest  Range of Motion: Bilateral Upper and Lower Extremities  Intervention: Prevent Infection  Recent Flowsheet Documentation  Taken 02/26/2020 0800 by Carin Hock, RN  Infection Prevention:   hand hygiene promoted   personal protective equipment utilized   rest/sleep promoted   single patient room provided   visitors restricted/screened  Goal: Optimal Comfort and Wellbeing  02/26/2020 1700 by Carin Hock, RN  Outcome: Progressing  02/26/2020 1444 by Carin Hock, RN  Outcome: Progressing  Goal: Readiness for Transition of Care  02/26/2020 1700 by Carin Hock, RN  Outcome: Progressing  02/26/2020 1444 by Carin Hock, RN  Outcome: Progressing  Goal: Rounds/Family Conference  02/26/2020 1700 by Carin Hock, RN  Outcome: Progressing  02/26/2020 1444 by Carin Hock, RN  Outcome: Progressing

## 2020-02-27 NOTE — Unmapped (Signed)
Additional consult. Pt is on the PT caseload. Please refer to evaluation for details.
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
## 2020-02-27 NOTE — Unmapped (Signed)
Oriented. Has weakness all limbs. On PT, OT, in SR, BP stable. Both legs are swollen. On room air. Has good appetite. No BM. Making adequate urine. Bath done with linen change. For possible home tomorrow. We shall continue to observe closely.  Problem: Adult Inpatient Plan of Care  Goal: Plan of Care Review  Outcome: Ongoing - Unchanged  Goal: Patient-Specific Goal (Individualized)  Outcome: Ongoing - Unchanged  Goal: Absence of Hospital-Acquired Illness or Injury  Outcome: Ongoing - Unchanged  Intervention: Identify and Manage Fall Risk  Recent Flowsheet Documentation  Taken 02/27/2020 0200 by Tommye Standard, RN  Safety Interventions:   low bed   fall reduction program maintained   bed alarm  Taken 02/27/2020 0000 by Tommye Standard, RN  Safety Interventions:   low bed   fall reduction program maintained  Taken 02/26/2020 2200 by Tommye Standard, RN  Safety Interventions:   low bed   fall reduction program maintained  Taken 02/26/2020 2000 by Tommye Standard, RN  Safety Interventions:   low bed   fall reduction program maintained   bed alarm  Intervention: Prevent and Manage VTE (Venous Thromboembolism) Risk  Recent Flowsheet Documentation  Taken 02/26/2020 2000 by Tommye Standard, RN  Activity Management: bedrest  Goal: Optimal Comfort and Wellbeing  Outcome: Ongoing - Unchanged  Goal: Readiness for Transition of Care  Outcome: Ongoing - Unchanged  Goal: Rounds/Family Conference  Outcome: Ongoing - Unchanged     Problem: Self-Care Deficit  Goal: Improved Ability to Complete Activities of Daily Living  Outcome: Ongoing - Unchanged     Problem: Behavioral Health Comorbidity  Goal: Maintenance of Behavioral Health Symptom Control  Outcome: Ongoing - Unchanged     Problem: Hypertension Comorbidity  Goal: Blood Pressure in Desired Range  Outcome: Ongoing - Unchanged     Problem: Osteoarthritis Comorbidity  Goal: Maintenance of Osteoarthritis Symptom Control  Outcome: Ongoing - Unchanged  Intervention: Maintain Osteoarthritis Symptom Control  Recent Flowsheet Documentation  Taken 02/26/2020 2000 by Tommye Standard, RN  Activity Management: bedrest     Problem: Pain Chronic (Persistent) (Comorbidity Management)  Goal: Acceptable Pain Control and Functional Ability  Outcome: Ongoing - Unchanged     Problem: Behavioral Health Comorbidity  Goal: Maintenance of Behavioral Health Symptom Control  Outcome: Ongoing - Unchanged      Problem: Hypertension Comorbidity  Goal: Blood Pressure in Desired Range  Outcome: Ongoing - Unchanged     Problem: Fall Injury Risk  Goal: Absence of Fall and Fall-Related Injury  Outcome: Ongoing - Unchanged  Intervention: Promote Injury-Free Environment  Recent Flowsheet Documentation  Taken 02/27/2020 0200 by Tommye Standard, RN  Safety Interventions:   low bed   fall reduction program maintained   bed alarm  Taken 02/27/2020 0000 by Tommye Standard, RN  Safety Interventions:   low bed   fall reduction program maintained  Taken 02/26/2020 2200 by Tommye Standard, RN  Safety Interventions:   low bed   fall reduction program maintained  Taken 02/26/2020 2000 by Tommye Standard, RN  Safety Interventions:   low bed   fall reduction program maintained   bed alarm     Problem: Seizure, Active Management  Goal: Absence of Seizure/Seizure-Related Injury  Outcome: Ongoing - Unchanged     Problem: Gas Exchange Impaired  Goal: Optimal Gas Exchange  Outcome: Ongoing - Unchanged  Intervention: Optimize Oxygenation and Ventilation  Recent Flowsheet Documentation  Taken 02/26/2020 2000 by Tommye Standard, RN  Head  of Bed (HOB) Positioning: HOB at 30 degrees

## 2020-02-28 LAB — COMPREHENSIVE METABOLIC PANEL
ALBUMIN: 3.2 g/dL — ABNORMAL LOW (ref 3.4–5.0)
ALKALINE PHOSPHATASE: 72 U/L (ref 46–116)
ALT (SGPT): 23 U/L (ref 10–49)
ANION GAP: 6 mmol/L (ref 5–14)
AST (SGOT): 19 U/L (ref <=34)
BILIRUBIN TOTAL: 0.5 mg/dL (ref 0.3–1.2)
BLOOD UREA NITROGEN: 11 mg/dL (ref 9–23)
BUN / CREAT RATIO: 17
CALCIUM: 9.6 mg/dL (ref 8.7–10.4)
CHLORIDE: 93 mmol/L — ABNORMAL LOW (ref 98–107)
CO2: 26.8 mmol/L (ref 20.0–31.0)
CREATININE: 0.64 mg/dL
EGFR CKD-EPI AA MALE: >90 mL/min/{1.73_m2} (ref >=60)
EGFR CKD-EPI NON-AA MALE: >90 mL/min/{1.73_m2} (ref >=60)
GLUCOSE RANDOM: 111 mg/dL (ref 70–179)
POTASSIUM: 4.4 mmol/L (ref 3.4–4.5)
PROTEIN TOTAL: 7.2 g/dL (ref 5.7–8.2)
SODIUM: 126 mmol/L — ABNORMAL LOW (ref 135–145)

## 2020-02-28 LAB — CBC
HEMATOCRIT: 33.3 % — ABNORMAL LOW (ref 38.0–50.0)
HEMOGLOBIN: 11.5 g/dL — ABNORMAL LOW (ref 13.5–17.5)
MEAN CORPUSCULAR HEMOGLOBIN CONC: 34.6 g/dL (ref 30.0–36.0)
MEAN CORPUSCULAR HEMOGLOBIN: 33.2 pg (ref 26.0–34.0)
MEAN CORPUSCULAR VOLUME: 95.9 fL — ABNORMAL HIGH (ref 81.0–95.0)
MEAN PLATELET VOLUME: 7.9 fL (ref 7.0–10.0)
PLATELET COUNT: 162 10*9/L (ref 150–450)
RED BLOOD CELL COUNT: 3.47 10*12/L — ABNORMAL LOW (ref 4.32–5.72)
RED CELL DISTRIBUTION WIDTH: 14.2 % (ref 12.0–15.0)
WBC ADJUSTED: 6 10*9/L (ref 3.5–10.5)

## 2020-02-28 LAB — MAGNESIUM: MAGNESIUM: 1.8 mg/dL (ref 1.6–2.6)

## 2020-02-28 MED ADMIN — magnesium oxide (MAG-OX) tablet 400 mg: 400 mg | ORAL | @ 14:00:00 | Stop: 2020-02-28

## 2020-02-28 MED ADMIN — OLANZapine (ZYPREXA) tablet 15 mg: 15 mg | ORAL | @ 02:00:00 | Stop: 2020-03-08

## 2020-02-28 MED ADMIN — nicotine (NICODERM CQ) 21 mg/24 hr patch 1 patch: 1 | TRANSDERMAL | @ 14:00:00 | Stop: 2020-03-08

## 2020-02-28 MED ADMIN — gabapentin (NEURONTIN) capsule 200 mg: 200 mg | ORAL | @ 18:00:00 | Stop: 2020-03-08

## 2020-02-28 MED ADMIN — naltrexone (DEPADE) tablet 50 mg: 50 mg | ORAL | @ 14:00:00 | Stop: 2020-03-08

## 2020-02-28 MED ADMIN — divalproex (DEPAKOTE) DR tablet 1,000 mg: 1000 mg | ORAL | @ 02:00:00 | Stop: 2020-03-08

## 2020-02-28 MED ADMIN — gabapentin (NEURONTIN) capsule 200 mg: 200 mg | ORAL | @ 14:00:00 | Stop: 2020-03-08

## 2020-02-28 MED ADMIN — folic acid (FOLVITE) tablet 1 mg: 1 mg | ORAL | @ 14:00:00 | Stop: 2020-03-08

## 2020-02-28 MED ADMIN — polyethylene glycol (MIRALAX) packet 17 g: 17 g | ORAL | @ 14:00:00 | Stop: 2020-03-08

## 2020-02-28 MED ADMIN — senna (SENOKOT) tablet 1 tablet: 1 | ORAL | @ 02:00:00 | Stop: 2020-03-08

## 2020-02-28 MED ADMIN — gabapentin (NEURONTIN) capsule 200 mg: 200 mg | ORAL | @ 02:00:00 | Stop: 2020-03-08

## 2020-02-28 MED ADMIN — lisinopriL (PRINIVIL,ZESTRIL) tablet 40 mg: 40 mg | ORAL | @ 14:00:00 | Stop: 2020-03-08

## 2020-02-28 MED ADMIN — enoxaparin (LOVENOX) syringe 40 mg: 40 mg | SUBCUTANEOUS | @ 14:00:00 | Stop: 2020-03-08

## 2020-02-28 NOTE — Unmapped (Signed)
Problem: Fall Injury Risk  Goal: Absence of Fall and Fall-Related Injury  Outcome: Ongoing - Unchanged     Problem: Pain Chronic (Persistent) (Comorbidity Management)  Goal: Acceptable Pain Control and Functional Ability  Outcome: Progressing  Note: Pt denies discomfort.      Problem: Seizure, Active Management  Goal: Absence of Seizure/Seizure-Related Injury  Outcome: Progressing  Intervention: Prevent Seizure-Related Injury  Recent Flowsheet Documentation  Taken 02/27/2020 2000 by Garner Gavel, RN  Seizure Precautions: clutter-free environment maintained     Problem: Gas Exchange Impaired  Goal: Optimal Gas Exchange  Outcome: Progressing     Problem: Adult Inpatient Plan of Care  Goal: Absence of Hospital-Acquired Illness or Injury  Intervention: Prevent Skin Injury  Recent Flowsheet Documentation  Taken 02/27/2020 2200 by Garner Gavel, RN  Skin Protection: adhesive use limited  Taken 02/27/2020 2000 by Garner Gavel, RN  Skin Protection: adhesive use limited  Intervention: Prevent and Manage VTE (Venous Thromboembolism) Risk  Recent Flowsheet Documentation  Taken 02/27/2020 2000 by Garner Gavel, RN  Activity Management: activity adjusted per tolerance  Intervention: Prevent Infection  Recent Flowsheet Documentation  Taken 02/27/2020 2000 by Garner Gavel, RN  Infection Prevention: environmental surveillance performed     Problem: Osteoarthritis Comorbidity  Goal: Maintenance of Osteoarthritis Symptom Control  Intervention: Maintain Osteoarthritis Symptom Control  Recent Flowsheet Documentation  Taken 02/27/2020 2000 by Garner Gavel, RN  Activity Management: activity adjusted per tolerance
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
## 2020-02-28 NOTE — Unmapped (Signed)
Pt is downgrade to floor status. Alert and oriented. VSS. On room air. Continues on the PT/OT. Generalized weakness with unsteady gait. Up in the chair with PT and stayed in the bed for a few hours. Appetite remains fair. BM X 1. Void adequately.  Continues on fall and seizure precautions. No s/s of acute distress noted. Plan to be discharged to the rehab. Pt's family was updated over the phone. Case management is working on the Placement. Continue to monitor.   Problem: Adult Inpatient Plan of Care  Goal: Plan of Care Review  Outcome: Progressing  Goal: Patient-Specific Goal (Individualized)  Outcome: Progressing  Goal: Absence of Hospital-Acquired Illness or Injury  Outcome: Progressing  Intervention: Identify and Manage Fall Risk  Recent Flowsheet Documentation  Taken 02/27/2020 0800 by Trixie Dredge, RN  Safety Interventions:  ??? aspiration precautions  ??? fall reduction program maintained  ??? low bed  Intervention: Prevent and Manage VTE (Venous Thromboembolism) Risk  Recent Flowsheet Documentation  Taken 02/27/2020 1400 by Trixie Dredge, RN  Activity Management: activity adjusted per tolerance  Intervention: Prevent Infection  Recent Flowsheet Documentation  Taken 02/27/2020 0800 by Trixie Dredge, RN  Infection Prevention:  ??? environmental surveillance performed  ??? rest/sleep promoted  ??? hand hygiene promoted  Goal: Optimal Comfort and Wellbeing  Outcome: Progressing  Goal: Readiness for Transition of Care  Outcome: Progressing  Goal: Rounds/Family Conference  Outcome: Progressing     Problem: Self-Care Deficit  Goal: Improved Ability to Complete Activities of Daily Living  Outcome: Progressing     Problem: Behavioral Health Comorbidity  Goal: Maintenance of Behavioral Health Symptom Control  Outcome: Progressing     Problem: Hypertension Comorbidity  Goal: Blood Pressure in Desired Range  Outcome: Progressing     Problem: Osteoarthritis Comorbidity  Goal: Maintenance of Osteoarthritis Symptom Control  Outcome: Progressing  Intervention: Maintain Osteoarthritis Symptom Control  Recent Flowsheet Documentation  Taken 02/27/2020 1400 by Trixie Dredge, RN  Activity Management: activity adjusted per tolerance     Problem: Pain Chronic (Persistent) (Comorbidity Management)  Goal: Acceptable Pain Control and Functional Ability  Outcome: Progressing     Problem: Behavioral Health Comorbidity  Goal: Maintenance of Behavioral Health Symptom Control  Outcome: Progressing     Problem: Hypertension Comorbidity  Goal: Blood Pressure in Desired Range  Outcome: Progressing     Problem: Fall Injury Risk  Goal: Absence of Fall and Fall-Related Injury  Outcome: Progressing  Intervention: Promote Injury-Free Environment  Recent Flowsheet Documentation  Taken 02/27/2020 0800 by Trixie Dredge, RN  Safety Interventions:  ??? aspiration precautions  ??? fall reduction program maintained  ??? low bed     Problem: Seizure, Active Management  Goal: Absence of Seizure/Seizure-Related Injury  Outcome: Progressing  Intervention: Prevent Seizure-Related Injury  Recent Flowsheet Documentation  Taken 02/27/2020 0800 by Trixie Dredge, RN  Seizure Precautions:  ??? clutter-free environment maintained  ??? activity supervised     Problem: Gas Exchange Impaired  Goal: Optimal Gas Exchange  Outcome: Progressing  Intervention: Optimize Oxygenation and Ventilation  Recent Flowsheet Documentation  Taken 02/27/2020 1000 by Trixie Dredge, RN  Head of Bed [facility]) Positioning: HOB elevated  Taken 02/27/2020 0800 by Trixie Dredge, RN  Head of Bed [facility]) Positioning: HOB elevated

## 2020-02-29 LAB — BASIC METABOLIC PANEL
ANION GAP: 5 mmol/L (ref 5–14)
BLOOD UREA NITROGEN: 14 mg/dL (ref 9–23)
BUN / CREAT RATIO: 21
CALCIUM: 9.3 mg/dL (ref 8.7–10.4)
CHLORIDE: 94 mmol/L — ABNORMAL LOW (ref 98–107)
CO2: 25.9 mmol/L (ref 20.0–31.0)
CREATININE: 0.66 mg/dL
EGFR CKD-EPI AA MALE: >90 mL/min/{1.73_m2} (ref >=60)
EGFR CKD-EPI NON-AA MALE: >90 mL/min/{1.73_m2} (ref >=60)
GLUCOSE RANDOM: 104 mg/dL (ref 70–179)
POTASSIUM: 4.7 mmol/L — ABNORMAL HIGH (ref 3.4–4.5)
SODIUM: 125 mmol/L — ABNORMAL LOW (ref 135–145)

## 2020-02-29 LAB — MAGNESIUM: MAGNESIUM: 1.6 mg/dL (ref 1.6–2.6)

## 2020-02-29 MED ADMIN — naltrexone (DEPADE) tablet 50 mg: 50 mg | ORAL | @ 14:00:00 | Stop: 2020-03-08

## 2020-02-29 MED ADMIN — gabapentin (NEURONTIN) capsule 200 mg: 200 mg | ORAL | @ 02:00:00 | Stop: 2020-03-08

## 2020-02-29 MED ADMIN — gabapentin (NEURONTIN) capsule 200 mg: 200 mg | ORAL | @ 19:00:00 | Stop: 2020-03-08

## 2020-02-29 MED ADMIN — folic acid (FOLVITE) tablet 1 mg: 1 mg | ORAL | @ 14:00:00 | Stop: 2020-03-08

## 2020-02-29 MED ADMIN — divalproex (DEPAKOTE) DR tablet 1,000 mg: 1000 mg | ORAL | @ 02:00:00 | Stop: 2020-03-08

## 2020-02-29 MED ADMIN — polyethylene glycol (MIRALAX) packet 17 g: 17 g | ORAL | @ 14:00:00 | Stop: 2020-03-08

## 2020-02-29 MED ADMIN — enoxaparin (LOVENOX) syringe 40 mg: 40 mg | SUBCUTANEOUS | @ 14:00:00 | Stop: 2020-03-08

## 2020-02-29 MED ADMIN — nicotine (NICODERM CQ) 21 mg/24 hr patch 1 patch: 1 | TRANSDERMAL | @ 14:00:00 | Stop: 2020-03-08

## 2020-02-29 MED ADMIN — magnesium oxide (MAG-OX) tablet 800 mg: 800 mg | ORAL | @ 14:00:00 | Stop: 2020-03-08

## 2020-02-29 MED ADMIN — gabapentin (NEURONTIN) capsule 200 mg: 200 mg | ORAL | @ 14:00:00 | Stop: 2020-03-08

## 2020-02-29 MED ADMIN — lisinopriL (PRINIVIL,ZESTRIL) tablet 40 mg: 40 mg | ORAL | @ 14:00:00 | Stop: 2020-03-08

## 2020-02-29 MED ADMIN — OLANZapine (ZYPREXA) tablet 15 mg: 15 mg | ORAL | @ 02:00:00 | Stop: 2020-03-08
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
## 2020-02-29 NOTE — Unmapped (Signed)
Pt afebrile, VSS. Alert and oriented X 4. Orders food independently from room. Voiding adequate urine in urinal. BM in chair this am, states he wasn't aware he stooled. Awaiting facility placement.   Problem: Adult Inpatient Plan of Care  Goal: Plan of Care Review  Outcome: Progressing  Goal: Patient-Specific Goal (Individualized)  Outcome: Progressing  Goal: Absence of Hospital-Acquired Illness or Injury  Outcome: Progressing  Intervention: Prevent Skin Injury  Recent Flowsheet Documentation  Taken 02/28/2020 0800 by Carin Hock, RN  Skin Protection: adhesive use limited  Intervention: Prevent and Manage VTE (Venous Thromboembolism) Risk  Recent Flowsheet Documentation  Taken 02/28/2020 0800 by Carin Hock, RN  Activity Management: activity adjusted per tolerance  Range of Motion: Bilateral Upper and Lower Extremities  Intervention: Prevent Infection  Recent Flowsheet Documentation  Taken 02/28/2020 0800 by Carin Hock, RN  Infection Prevention:   single patient room provided   rest/sleep promoted   personal protective equipment utilized   hand hygiene promoted  Goal: Optimal Comfort and Wellbeing  Outcome: Progressing  Goal: Readiness for Transition of Care  Outcome: Progressing  Goal: Rounds/Family Conference  Outcome: Progressing

## 2020-02-29 NOTE — Unmapped (Signed)
No changes overnight.  Remains floor care status.  Alert and oriented.  Voids to urinal independently.  Independent in bed.  Did not like dinner, but ate some snacks from home.  Vss.  Afebrile.  No distress.  Denies pain.  CIWA 0.  No c/o.  Uses call bell appropriately.  Slept quietly most of night.          Problem: Adult Inpatient Plan of Care  Goal: Plan of Care Review  Outcome: Ongoing - Unchanged  Goal: Patient-Specific Goal (Individualized)  Outcome: Ongoing - Unchanged  Goal: Absence of Hospital-Acquired Illness or Injury  Outcome: Ongoing - Unchanged  Intervention: Prevent Skin Injury  Recent Flowsheet Documentation  Taken 02/28/2020 2000 by Rodney Cruise, RN  Skin Protection:   adhesive use limited   incontinence pads utilized  Intervention: Prevent and Manage VTE (Venous Thromboembolism) Risk  Recent Flowsheet Documentation  Taken 02/28/2020 2000 by Rodney Cruise, RN  Activity Management: bedrest  Intervention: Prevent Infection  Recent Flowsheet Documentation  Taken 02/28/2020 2000 by Rodney Cruise, RN  Infection Prevention: single patient room provided  Goal: Optimal Comfort and Wellbeing  Outcome: Ongoing - Unchanged  Goal: Readiness for Transition of Care  Outcome: Ongoing - Unchanged  Goal: Rounds/Family Conference  Outcome: Ongoing - Unchanged     Problem: Self-Care Deficit  Goal: Improved Ability to Complete Activities of Daily Living  Outcome: Ongoing - Unchanged     Problem: Behavioral Health Comorbidity  Goal: Maintenance of Behavioral Health Symptom Control  Outcome: Ongoing - Unchanged     Problem: Hypertension Comorbidity  Goal: Blood Pressure in Desired Range  Outcome: Ongoing - Unchanged     Problem: Osteoarthritis Comorbidity  Goal: Maintenance of Osteoarthritis Symptom Control  Outcome: Ongoing - Unchanged  Intervention: Maintain Osteoarthritis Symptom Control  Recent Flowsheet Documentation  Taken 02/28/2020 2000 by Rodney Cruise, RN  Activity Management: bedrest     Problem: Pain Chronic (Persistent) (Comorbidity Management)  Goal: Acceptable Pain Control and Functional Ability  Outcome: Ongoing - Unchanged     Problem: Fall Injury Risk  Goal: Absence of Fall and Fall-Related Injury  Outcome: Ongoing - Unchanged     Problem: Behavioral Health Comorbidity  Goal: Maintenance of Behavioral Health Symptom Control  Outcome: Ongoing - Unchanged     Problem: Hypertension Comorbidity  Goal: Blood Pressure in Desired Range  Outcome: Ongoing - Unchanged     Problem: Seizure, Active Management  Goal: Absence of Seizure/Seizure-Related Injury  Outcome: Ongoing - Unchanged  Intervention: Prevent Seizure-Related Injury  Recent Flowsheet Documentation  Taken 02/28/2020 2000 by Rodney Cruise, RN  Seizure Precautions:   clutter-free environment maintained   side rails padded   emergency equipment at bedside     Problem: Gas Exchange Impaired  Goal: Optimal Gas Exchange  Outcome: Ongoing - Unchanged  Intervention: Optimize Oxygenation and Ventilation  Recent Flowsheet Documentation  Taken 02/28/2020 2000 by Rodney Cruise, RN  Head of Bed [facility]) Positioning: HOB at 45 degrees     Problem: Skin Injury Risk Increased  Goal: Skin Health and Integrity  Outcome: Ongoing - Unchanged  Intervention: Optimize Skin Protection  Recent Flowsheet Documentation  Taken 02/28/2020 2000 by Rodney Cruise, RN  Pressure Reduction Techniques: frequent weight shift encouraged  Head of Bed (HOB) Positioning: HOB at 45 degrees  Pressure Reduction Devices: pressure-redistributing mattress utilized  Skin Protection:   adhesive use limited   incontinence pads utilized

## 2020-03-01 LAB — BASIC METABOLIC PANEL
ANION GAP: 7 mmol/L (ref 5–14)
BLOOD UREA NITROGEN: 16 mg/dL (ref 9–23)
BUN / CREAT RATIO: 24
CALCIUM: 9.3 mg/dL (ref 8.7–10.4)
CHLORIDE: 94 mmol/L — ABNORMAL LOW (ref 98–107)
CO2: 25.1 mmol/L (ref 20.0–31.0)
CREATININE: 0.66 mg/dL
EGFR CKD-EPI AA MALE: >90 mL/min/{1.73_m2} (ref >=60)
EGFR CKD-EPI NON-AA MALE: >90 mL/min/{1.73_m2} (ref >=60)
GLUCOSE RANDOM: 104 mg/dL (ref 70–179)
POTASSIUM: 4.1 mmol/L (ref 3.4–4.5)
SODIUM: 126 mmol/L — ABNORMAL LOW (ref 135–145)

## 2020-03-01 LAB — MAGNESIUM: MAGNESIUM: 1.6 mg/dL (ref 1.6–2.6)

## 2020-03-01 MED ORDER — LISINOPRIL 40 MG TABLET
ORAL_TABLET | Freq: Every day | ORAL | 0 refills | 30.00000 days | Status: CN
Start: 2020-03-01 — End: 2020-03-31

## 2020-03-01 MED ORDER — OLANZAPINE 15 MG TABLET
ORAL_TABLET | Freq: Every evening | ORAL | 0 refills | 30.00000 days | Status: CN
Start: 2020-03-01 — End: 2020-03-31

## 2020-03-01 MED ADMIN — folic acid (FOLVITE) tablet 1 mg: 1 mg | ORAL | @ 14:00:00 | Stop: 2020-03-08

## 2020-03-01 MED ADMIN — magnesium oxide (MAG-OX) tablet 800 mg: 800 mg | ORAL | @ 14:00:00 | Stop: 2020-03-08

## 2020-03-01 MED ADMIN — gabapentin (NEURONTIN) capsule 200 mg: 200 mg | ORAL | @ 19:00:00 | Stop: 2020-03-08

## 2020-03-01 MED ADMIN — senna (SENOKOT) tablet 1 tablet: 1 | ORAL | @ 03:00:00 | Stop: 2020-03-08

## 2020-03-01 MED ADMIN — enoxaparin (LOVENOX) syringe 40 mg: 40 mg | SUBCUTANEOUS | @ 14:00:00 | Stop: 2020-03-08

## 2020-03-01 MED ADMIN — polyethylene glycol (MIRALAX) packet 17 g: 17 g | ORAL | @ 03:00:00 | Stop: 2020-03-08

## 2020-03-01 MED ADMIN — gabapentin (NEURONTIN) capsule 200 mg: 200 mg | ORAL | @ 03:00:00 | Stop: 2020-03-08

## 2020-03-01 MED ADMIN — divalproex (DEPAKOTE) DR tablet 1,000 mg: 1000 mg | ORAL | @ 03:00:00 | Stop: 2020-03-08

## 2020-03-01 MED ADMIN — naltrexone (DEPADE) tablet 50 mg: 50 mg | ORAL | @ 14:00:00 | Stop: 2020-03-08

## 2020-03-01 MED ADMIN — gabapentin (NEURONTIN) capsule 200 mg: 200 mg | ORAL | @ 14:00:00 | Stop: 2020-03-08

## 2020-03-01 MED ADMIN — nicotine (NICODERM CQ) 21 mg/24 hr patch 1 patch: 1 | TRANSDERMAL | @ 14:00:00 | Stop: 2020-03-08

## 2020-03-01 MED ADMIN — polyethylene glycol (MIRALAX) packet 17 g: 17 g | ORAL | @ 14:00:00 | Stop: 2020-03-08

## 2020-03-01 MED ADMIN — lisinopriL (PRINIVIL,ZESTRIL) tablet 40 mg: 40 mg | ORAL | @ 14:00:00 | Stop: 2020-03-08

## 2020-03-01 MED ADMIN — OLANZapine (ZYPREXA) tablet 15 mg: 15 mg | ORAL | @ 03:00:00 | Stop: 2020-03-08

## 2020-03-01 NOTE — Unmapped (Signed)
Assumed care at 1100. No adverse events today. Pt worked with OT and PT, got dressed. Pt  ambulated with walker frequently for exercise in the room and at end of hall. Steady gait with walker, pt stated he felt better with movement. Wife on phone with pt, she voiced her concern that an MD has not called her and she wishes to speak with MD. PM Resident, Shawnie Dapper MD messaged and requested for MD to call. Shawnie Dapper stated that he is not as familiar and day team will need to call. Relayed info to pt & pt's wife, as well as night shift charge and Public relations account executive. Instructed pt and wife that MD does not call by 1900 tomorrow to have floor charge nurse get involved and contact house supervisor. Voiced understanding.     Total intake for 24 hours 1395 mL with 985 out.     Problem: Adult Inpatient Plan of Care  Goal: Plan of Care Review  Outcome: Progressing  Goal: Patient-Specific Goal (Individualized)  Outcome: Progressing  Goal: Absence of Hospital-Acquired Illness or Injury  Outcome: Progressing  Goal: Optimal Comfort and Wellbeing  Outcome: Progressing  Goal: Readiness for Transition of Care  Outcome: Progressing  Goal: Rounds/Family Conference  Outcome: Progressing     Problem: Self-Care Deficit  Goal: Improved Ability to Complete Activities of Daily Living  Outcome: Progressing     Problem: Behavioral Health Comorbidity  Goal: Maintenance of Behavioral Health Symptom Control  Outcome: Progressing     Problem: Hypertension Comorbidity  Goal: Blood Pressure in Desired Range  Outcome: Progressing     Problem: Osteoarthritis Comorbidity  Goal: Maintenance of Osteoarthritis Symptom Control  Outcome: Progressing     Problem: Pain Chronic (Persistent) (Comorbidity Management)  Goal: Acceptable Pain Control and Functional Ability  Outcome: Progressing     Problem: Fall Injury Risk  Goal: Absence of Fall and Fall-Related Injury  Outcome: Progressing     Problem: Behavioral Health Comorbidity  Goal: Maintenance of Behavioral Health Symptom Control  Outcome: Progressing     Problem: Hypertension Comorbidity  Goal: Blood Pressure in Desired Range  Outcome: Progressing     Problem: Seizure, Active Management  Goal: Absence of Seizure/Seizure-Related Injury  Outcome: Progressing     Problem: Gas Exchange Impaired  Goal: Optimal Gas Exchange  Outcome: Progressing  Intervention: Optimize Oxygenation and Ventilation  Recent Flowsheet Documentation  Taken 02/29/2020 1600 by Roxy Cedar, RN  Head of Bed [facility]) Positioning: HOB at 60-90 degrees  Taken 02/29/2020 1330 by Roxy Cedar, RN  Head of Bed [facility]) Positioning: (sitting in chair) other (see comments)     Problem: Skin Injury Risk Increased  Goal: Skin Health and Integrity  Outcome: Progressing  Intervention: Optimize Skin Protection  Recent Flowsheet Documentation  Taken 02/29/2020 1600 by Roxy Cedar, RN  Head of Bed [facility]) Positioning: HOB at 60-90 degrees  Taken 02/29/2020 1330 by Roxy Cedar, RN  Head of Bed [facility]) Positioning: (sitting in chair) other (see comments)
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
## 2020-03-01 NOTE — Unmapped (Signed)
Oriented, denies pain. Ambulating with a walker. Has tremors om walking. BP, HR stable. On room air. Voiding adequately on a urinal. Afebrile. We shall continue to observe.  Problem: Adult Inpatient Plan of Care  Goal: Absence of Hospital-Acquired Illness or Injury  Intervention: Prevent and Manage VTE (Venous Thromboembolism) Risk  Recent Flowsheet Documentation  Taken 03/01/2020 0000 by Tommye Standard, RN  Activity Management: activity encouraged     Problem: Osteoarthritis Comorbidity  Goal: Maintenance of Osteoarthritis Symptom Control  Intervention: Maintain Osteoarthritis Symptom Control  Recent Flowsheet Documentation  Taken 03/01/2020 0000 by Tommye Standard, RN  Activity Management: activity encouraged       Problem: Skin Injury Risk Increased  Goal: Skin Health and Integrity  Intervention: Optimize Skin Protection  Recent Flowsheet Documentation  Taken 03/01/2020 0200 by Tommye Standard, RN  Head of Bed [facility]) Positioning: HOB at 30 degrees  Taken 03/01/2020 0042 by Tommye Standard, RN  Head of Bed [facility]) Positioning: HOB at 30-45 degrees  Taken 03/01/2020 0000 by Tommye Standard, RN  Pressure Reduction Techniques: sit time limited to 1 hour  Head of Bed (HOB) Positioning: HOB at 30 degrees  Pressure Reduction Devices: pressure-redistributing mattress utilized

## 2020-03-02 LAB — BASIC METABOLIC PANEL
ANION GAP: 5 mmol/L (ref 5–14)
BLOOD UREA NITROGEN: 15 mg/dL (ref 9–23)
BUN / CREAT RATIO: 24
CALCIUM: 9.6 mg/dL (ref 8.7–10.4)
CHLORIDE: 93 mmol/L — ABNORMAL LOW (ref 98–107)
CO2: 27.3 mmol/L (ref 20.0–31.0)
CREATININE: 0.63 mg/dL
EGFR CKD-EPI AA MALE: >90 mL/min/{1.73_m2} (ref >=60)
EGFR CKD-EPI NON-AA MALE: >90 mL/min/{1.73_m2} (ref >=60)
GLUCOSE RANDOM: 103 mg/dL (ref 70–179)
POTASSIUM: 4.7 mmol/L — ABNORMAL HIGH (ref 3.4–4.5)
SODIUM: 125 mmol/L — ABNORMAL LOW (ref 135–145)

## 2020-03-02 LAB — MAGNESIUM: MAGNESIUM: 1.7 mg/dL (ref 1.6–2.6)

## 2020-03-02 LAB — CREATININE, URINE: CREATININE, URINE: 103 mg/dL

## 2020-03-02 LAB — SODIUM, URINE, RANDOM: SODIUM URINE: 110 mmol/L

## 2020-03-02 LAB — OSMOLALITY, RANDOM URINE: OSMOLALITY URINE: 649 mosm/kg

## 2020-03-02 MED ADMIN — polyethylene glycol (MIRALAX) packet 17 g: 17 g | ORAL | @ 02:00:00 | Stop: 2020-03-08

## 2020-03-02 MED ADMIN — OLANZapine (ZYPREXA) tablet 15 mg: 15 mg | ORAL | @ 02:00:00 | Stop: 2020-03-08

## 2020-03-02 MED ADMIN — divalproex (DEPAKOTE) DR tablet 1,000 mg: 1000 mg | ORAL | @ 02:00:00 | Stop: 2020-03-08

## 2020-03-02 MED ADMIN — gabapentin (NEURONTIN) capsule 200 mg: 200 mg | ORAL | @ 02:00:00 | Stop: 2020-03-08

## 2020-03-02 MED ADMIN — enoxaparin (LOVENOX) syringe 40 mg: 40 mg | SUBCUTANEOUS | @ 14:00:00 | Stop: 2020-03-08

## 2020-03-02 MED ADMIN — gabapentin (NEURONTIN) capsule 200 mg: 200 mg | ORAL | @ 21:00:00 | Stop: 2020-03-08

## 2020-03-02 MED ADMIN — nicotine (NICODERM CQ) 21 mg/24 hr patch 1 patch: 1 | TRANSDERMAL | @ 14:00:00 | Stop: 2020-03-08

## 2020-03-02 MED ADMIN — naltrexone (DEPADE) tablet 50 mg: 50 mg | ORAL | @ 14:00:00 | Stop: 2020-03-08

## 2020-03-02 MED ADMIN — gabapentin (NEURONTIN) capsule 200 mg: 200 mg | ORAL | @ 14:00:00 | Stop: 2020-03-08

## 2020-03-02 MED ADMIN — lisinopriL (PRINIVIL,ZESTRIL) tablet 40 mg: 40 mg | ORAL | @ 14:00:00 | Stop: 2020-03-08

## 2020-03-02 MED ADMIN — magnesium oxide (MAG-OX) tablet 800 mg: 800 mg | ORAL | @ 14:00:00 | Stop: 2020-03-08

## 2020-03-02 MED ADMIN — senna (SENOKOT) tablet 1 tablet: 1 | ORAL | @ 02:00:00 | Stop: 2020-03-08

## 2020-03-02 MED ADMIN — polyethylene glycol (MIRALAX) packet 17 g: 17 g | ORAL | @ 14:00:00 | Stop: 2020-03-08

## 2020-03-02 MED ADMIN — folic acid (FOLVITE) tablet 1 mg: 1 mg | ORAL | @ 14:00:00 | Stop: 2020-03-08
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
## 2020-03-02 NOTE — Unmapped (Signed)
Client admitted to floor from CCU this shift. Alert and oriented x 4, able to make all needs known to staff. On seizure precautions. Has IV in left forearm. Denies pain on arrival. Has mild redness to scrotum. Ambulatory with assist and walker, tremors noted with ambulation at baseline. Continent of bowel and bladder.

## 2020-03-02 NOTE — Unmapped (Signed)
Pt assisted with needs.Denies pain and no acute distress noted at this time.Seizure and fall  precaution maintained.Encouraged to call for help when in need.Will keep monitoring.   Problem: Adult Inpatient Plan of Care  Goal: Plan of Care Review  Outcome: Progressing  Goal: Patient-Specific Goal (Individualized)  Outcome: Progressing  Goal: Absence of Hospital-Acquired Illness or Injury  Outcome: Progressing  Intervention: Prevent and Manage VTE (Venous Thromboembolism) Risk  Recent Flowsheet Documentation  Taken 03/01/2020 1919 by Wonda Olds, RN  Activity Management: activity adjusted per tolerance  Goal: Optimal Comfort and Wellbeing  Outcome: Progressing  Goal: Readiness for Transition of Care  Outcome: Progressing  Goal: Rounds/Family Conference  Outcome: Progressing     Problem: Self-Care Deficit  Goal: Improved Ability to Complete Activities of Daily Living  Outcome: Progressing     Problem: Behavioral Health Comorbidity  Goal: Maintenance of Behavioral Health Symptom Control  Outcome: Progressing     Problem: Pain Chronic (Persistent) (Comorbidity Management)  Goal: Acceptable Pain Control and Functional Ability  Outcome: Progressing     Problem: Fall Injury Risk  Goal: Absence of Fall and Fall-Related Injury  Outcome: Progressing     Problem: Seizure, Active Management  Goal: Absence of Seizure/Seizure-Related Injury  Outcome: Progressing     Problem: Skin Injury Risk Increased  Goal: Skin Health and Integrity  Outcome: Progressing

## 2020-03-03 LAB — BASIC METABOLIC PANEL
ANION GAP: 6 mmol/L (ref 5–14)
BLOOD UREA NITROGEN: 16 mg/dL (ref 9–23)
BUN / CREAT RATIO: 22
CALCIUM: 9.3 mg/dL (ref 8.7–10.4)
CHLORIDE: 94 mmol/L — ABNORMAL LOW (ref 98–107)
CO2: 26.2 mmol/L (ref 20.0–31.0)
CREATININE: 0.72 mg/dL
EGFR CKD-EPI AA MALE: >90 mL/min/{1.73_m2} (ref >=60)
EGFR CKD-EPI NON-AA MALE: >90 mL/min/{1.73_m2} (ref >=60)
GLUCOSE RANDOM: 103 mg/dL (ref 70–179)
POTASSIUM: 4.5 mmol/L (ref 3.4–4.5)
SODIUM: 126 mmol/L — ABNORMAL LOW (ref 135–145)

## 2020-03-03 LAB — MAGNESIUM: MAGNESIUM: 1.7 mg/dL (ref 1.6–2.6)

## 2020-03-03 MED ORDER — NICOTINE (POLACRILEX) 4 MG GUM
BUCCAL | 0 refills | 5.00000 days | PRN
Start: 2020-03-03 — End: 2020-04-02

## 2020-03-03 MED ADMIN — polyethylene glycol (MIRALAX) packet 17 g: 17 g | ORAL | @ 15:00:00 | Stop: 2020-03-08

## 2020-03-03 MED ADMIN — divalproex (DEPAKOTE) DR tablet 1,000 mg: 1000 mg | ORAL | @ 01:00:00 | Stop: 2020-03-08

## 2020-03-03 MED ADMIN — OLANZapine (ZYPREXA) tablet 15 mg: 15 mg | ORAL | @ 01:00:00 | Stop: 2020-03-08

## 2020-03-03 MED ADMIN — lisinopriL (PRINIVIL,ZESTRIL) tablet 40 mg: 40 mg | ORAL | @ 15:00:00 | Stop: 2020-03-08

## 2020-03-03 MED ADMIN — magnesium oxide (MAG-OX) tablet 800 mg: 800 mg | ORAL | @ 15:00:00 | Stop: 2020-03-08

## 2020-03-03 MED ADMIN — gabapentin (NEURONTIN) capsule 200 mg: 200 mg | ORAL | @ 15:00:00 | Stop: 2020-03-08

## 2020-03-03 MED ADMIN — gabapentin (NEURONTIN) capsule 200 mg: 200 mg | ORAL | @ 01:00:00 | Stop: 2020-03-08

## 2020-03-03 MED ADMIN — folic acid (FOLVITE) tablet 1 mg: 1 mg | ORAL | @ 15:00:00 | Stop: 2020-03-08

## 2020-03-03 MED ADMIN — gabapentin (NEURONTIN) capsule 200 mg: 200 mg | ORAL | @ 20:00:00 | Stop: 2020-03-08

## 2020-03-03 MED ADMIN — naltrexone (DEPADE) tablet 50 mg: 50 mg | ORAL | @ 15:00:00 | Stop: 2020-03-08

## 2020-03-03 MED ADMIN — enoxaparin (LOVENOX) syringe 40 mg: 40 mg | SUBCUTANEOUS | @ 15:00:00 | Stop: 2020-03-08

## 2020-03-03 MED ADMIN — nicotine (NICODERM CQ) 21 mg/24 hr patch 1 patch: 1 | TRANSDERMAL | @ 15:00:00 | Stop: 2020-03-08
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
## 2020-03-03 NOTE — Unmapped (Signed)
Client is alert and oriented x 4, able to make all needs known to staff. Denies pain at this time. Continent of bowel and bladder, urinal at bedside. Can ambulate with walker and use of gait belt for steadying of gait r/t tremors.     Problem: Adult Inpatient Plan of Care  Goal: Plan of Care Review  Outcome: Progressing  Goal: Patient-Specific Goal (Individualized)  Outcome: Progressing  Goal: Absence of Hospital-Acquired Illness or Injury  Outcome: Progressing  Intervention: Prevent and Manage VTE (Venous Thromboembolism) Risk  Recent Flowsheet Documentation  Taken 03/02/2020 0800 by Karl Bales, RN  Activity Management: activity adjusted per tolerance  Intervention: Prevent Infection  Recent Flowsheet Documentation  Taken 03/02/2020 0800 by Karl Bales, RN  Infection Prevention:   cohorting utilized   environmental surveillance performed   equipment surfaces disinfected   hand hygiene promoted   personal protective equipment utilized   rest/sleep promoted   single patient room provided   visitors restricted/screened  Goal: Optimal Comfort and Wellbeing  Outcome: Progressing  Goal: Readiness for Transition of Care  Outcome: Progressing  Goal: Rounds/Family Conference  Outcome: Progressing     Problem: Self-Care Deficit  Goal: Improved Ability to Complete Activities of Daily Living  Outcome: Progressing     Problem: Behavioral Health Comorbidity  Goal: Maintenance of Behavioral Health Symptom Control  Outcome: Progressing     Problem: Hypertension Comorbidity  Goal: Blood Pressure in Desired Range  Outcome: Progressing     Problem: Osteoarthritis Comorbidity  Goal: Maintenance of Osteoarthritis Symptom Control  Outcome: Progressing  Intervention: Maintain Osteoarthritis Symptom Control  Recent Flowsheet Documentation  Taken 03/02/2020 0800 by Karl Bales, RN  Activity Management: activity adjusted per tolerance     Problem: Pain Chronic (Persistent) (Comorbidity Management)  Goal: Acceptable Pain Control and Functional Ability  Outcome: Progressing     Problem: Fall Injury Risk  Goal: Absence of Fall and Fall-Related Injury  Outcome: Progressing     Problem: Behavioral Health Comorbidity  Goal: Maintenance of Behavioral Health Symptom Control  Outcome: Progressing     Problem: Hypertension Comorbidity  Goal: Blood Pressure in Desired Range  Outcome: Progressing     Problem: Seizure, Active Management  Goal: Absence of Seizure/Seizure-Related Injury  Outcome: Progressing  Intervention: Prevent Seizure-Related Injury  Recent Flowsheet Documentation  Taken 03/02/2020 0800 by Karl Bales, RN  Seizure Precautions:   side rails padded   clutter-free environment maintained   activity supervised     Problem: Gas Exchange Impaired  Goal: Optimal Gas Exchange  Outcome: Progressing     Problem: Skin Injury Risk Increased  Goal: Skin Health and Integrity  Outcome: Progressing  Intervention: Optimize Skin Protection  Recent Flowsheet Documentation  Taken 03/02/2020 0800 by Karl Bales, RN  Pressure Reduction Techniques: frequent weight shift encouraged  Pressure Reduction Devices: pressure-redistributing mattress utilized

## 2020-03-03 NOTE — Unmapped (Signed)
Progressing.NAD noted at this time.Will keep monitoring.  Problem: Adult Inpatient Plan of Care  Goal: Plan of Care Review  Outcome: Progressing  Goal: Patient-Specific Goal (Individualized)  Outcome: Progressing  Goal: Absence of Hospital-Acquired Illness or Injury  Outcome: Progressing  Goal: Optimal Comfort and Wellbeing  Outcome: Progressing  Goal: Readiness for Transition of Care  Outcome: Progressing  Goal: Rounds/Family Conference  Outcome: Progressing     Problem: Fall Injury Risk  Goal: Absence of Fall and Fall-Related Injury  Outcome: Progressing     Problem: Seizure, Active Management  Goal: Absence of Seizure/Seizure-Related Injury  Outcome: Progressing

## 2020-03-04 LAB — SODIUM: SODIUM: 126 mmol/L — ABNORMAL LOW (ref 135–145)

## 2020-03-04 MED ORDER — DIVALPROEX 500 MG TABLET,DELAYED RELEASE
ORAL_TABLET | Freq: Every evening | ORAL | 0 refills | 30 days
Start: 2020-03-04 — End: 2020-04-03

## 2020-03-04 MED ORDER — FOLIC ACID 1 MG TABLET
ORAL_TABLET | Freq: Every day | ORAL | 11 refills | 30 days
Start: 2020-03-04 — End: 2021-03-04

## 2020-03-04 MED ORDER — OLANZAPINE 15 MG TABLET
ORAL_TABLET | Freq: Every evening | ORAL | 0 refills | 30.00000 days
Start: 2020-03-04 — End: 2020-04-03

## 2020-03-04 MED ORDER — GABAPENTIN 100 MG CAPSULE
ORAL_CAPSULE | Freq: Three times a day (TID) | ORAL | 0 refills | 30 days
Start: 2020-03-04 — End: 2020-04-03

## 2020-03-04 MED ORDER — NICOTINE (POLACRILEX) 4 MG GUM
BUCCAL | 0 refills | 5 days | PRN
Start: 2020-03-04 — End: 2020-04-03

## 2020-03-04 MED ORDER — NALTREXONE 50 MG TABLET
ORAL_TABLET | Freq: Every day | ORAL | 11 refills | 30 days
Start: 2020-03-04 — End: 2021-03-04

## 2020-03-04 MED ADMIN — naltrexone (DEPADE) tablet 50 mg: 50 mg | ORAL | @ 14:00:00 | Stop: 2020-03-08

## 2020-03-04 MED ADMIN — lisinopriL (PRINIVIL,ZESTRIL) tablet 40 mg: 40 mg | ORAL | @ 14:00:00 | Stop: 2020-03-08

## 2020-03-04 MED ADMIN — enoxaparin (LOVENOX) syringe 40 mg: 40 mg | SUBCUTANEOUS | @ 14:00:00 | Stop: 2020-03-08

## 2020-03-04 MED ADMIN — senna (SENOKOT) tablet 1 tablet: 1 | ORAL | @ 01:00:00 | Stop: 2020-03-08

## 2020-03-04 MED ADMIN — ibuprofen (MOTRIN) tablet 400 mg: 400 mg | ORAL | @ 11:00:00 | Stop: 2020-03-08

## 2020-03-04 MED ADMIN — gabapentin (NEURONTIN) capsule 200 mg: 200 mg | ORAL | @ 20:00:00 | Stop: 2020-03-08

## 2020-03-04 MED ADMIN — folic acid (FOLVITE) tablet 1 mg: 1 mg | ORAL | @ 14:00:00 | Stop: 2020-03-08

## 2020-03-04 MED ADMIN — gabapentin (NEURONTIN) capsule 200 mg: 200 mg | ORAL | @ 14:00:00 | Stop: 2020-03-08

## 2020-03-04 MED ADMIN — OLANZapine (ZYPREXA) tablet 15 mg: 15 mg | ORAL | @ 01:00:00 | Stop: 2020-03-08

## 2020-03-04 MED ADMIN — magnesium oxide (MAG-OX) tablet 800 mg: 800 mg | ORAL | @ 14:00:00 | Stop: 2020-03-08

## 2020-03-04 MED ADMIN — gabapentin (NEURONTIN) capsule 200 mg: 200 mg | ORAL | @ 01:00:00 | Stop: 2020-03-08

## 2020-03-04 MED ADMIN — divalproex (DEPAKOTE) DR tablet 1,000 mg: 1000 mg | ORAL | @ 01:00:00 | Stop: 2020-03-08

## 2020-03-04 MED ADMIN — nicotine (NICODERM CQ) 21 mg/24 hr patch 1 patch: 1 | TRANSDERMAL | @ 14:00:00 | Stop: 2020-03-08

## 2020-03-04 NOTE — Unmapped (Signed)
Progressing.  Problem: Adult Inpatient Plan of Care  Goal: Plan of Care Review  Outcome: Progressing  Goal: Patient-Specific Goal (Individualized)  Outcome: Progressing  Goal: Absence of Hospital-Acquired Illness or Injury  Outcome: Progressing  Intervention: Prevent and Manage VTE (Venous Thromboembolism) Risk  Recent Flowsheet Documentation  Taken 03/03/2020 1917 by Wonda Olds, RN  Activity Management: activity adjusted per tolerance  Intervention: Prevent Infection  Recent Flowsheet Documentation  Taken 03/03/2020 1917 by Wonda Olds, RN  Infection Prevention: hand hygiene promoted  Goal: Optimal Comfort and Wellbeing  Outcome: Progressing  Goal: Readiness for Transition of Care  Outcome: Progressing  Goal: Rounds/Family Conference  Outcome: Progressing     Problem: Self-Care Deficit  Goal: Improved Ability to Complete Activities of Daily Living  Outcome: Progressing     Problem: Pain Chronic (Persistent) (Comorbidity Management)  Goal: Acceptable Pain Control and Functional Ability  Outcome: Progressing     Problem: Seizure, Active Management  Goal: Absence of Seizure/Seizure-Related Injury  Outcome: Progressing     Problem: Fall Injury Risk  Goal: Absence of Fall and Fall-Related Injury  Outcome: Progressing

## 2020-03-04 NOTE — Unmapped (Signed)
Pt alert and oriented x4. Pt denies pain this shift. Pt in NAD, VSS. Pt assisted to chair w/ 1 assist. Seizure precautions in place.       Problem: Adult Inpatient Plan of Care  Goal: Plan of Care Review  Outcome: Progressing  Goal: Patient-Specific Goal (Individualized)  Outcome: Progressing  Goal: Absence of Hospital-Acquired Illness or Injury  Outcome: Progressing  Goal: Optimal Comfort and Wellbeing  Outcome: Progressing  Goal: Readiness for Transition of Care  Outcome: Progressing  Goal: Rounds/Family Conference  Outcome: Progressing     Problem: Self-Care Deficit  Goal: Improved Ability to Complete Activities of Daily Living  Outcome: Progressing     Problem: Behavioral Health Comorbidity  Goal: Maintenance of Behavioral Health Symptom Control  Outcome: Progressing     Problem: Hypertension Comorbidity  Goal: Blood Pressure in Desired Range  Outcome: Progressing     Problem: Osteoarthritis Comorbidity  Goal: Maintenance of Osteoarthritis Symptom Control  Outcome: Progressing     Problem: Pain Chronic (Persistent) (Comorbidity Management)  Goal: Acceptable Pain Control and Functional Ability  Outcome: Progressing     Problem: Fall Injury Risk  Goal: Absence of Fall and Fall-Related Injury  Outcome: Progressing     Problem: Behavioral Health Comorbidity  Goal: Maintenance of Behavioral Health Symptom Control  Outcome: Progressing     Problem: Hypertension Comorbidity  Goal: Blood Pressure in Desired Range  Outcome: Progressing     Problem: Seizure, Active Management  Goal: Absence of Seizure/Seizure-Related Injury  Outcome: Progressing     Problem: Gas Exchange Impaired  Goal: Optimal Gas Exchange  Outcome: Progressing     Problem: Skin Injury Risk Increased  Goal: Skin Health and Integrity  Outcome: Progressing
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
## 2020-03-05 LAB — URIC ACID: URIC ACID: 4.5 mg/dL

## 2020-03-05 MED ORDER — NICOTINE 21 MG/24 HR DAILY TRANSDERMAL PATCH
MEDICATED_PATCH | Freq: Every day | TRANSDERMAL | 0 refills | 28.00000 days | Status: CP
Start: 2020-03-05 — End: 2020-03-08

## 2020-03-05 MED ORDER — OLANZAPINE 15 MG TABLET
ORAL_TABLET | Freq: Every evening | ORAL | 0 refills | 30.00000 days | Status: CP
Start: 2020-03-05 — End: 2020-03-08

## 2020-03-05 MED ORDER — LISINOPRIL 40 MG TABLET
ORAL_TABLET | Freq: Every day | ORAL | 0 refills | 30.00000 days | Status: CP
Start: 2020-03-05 — End: 2020-03-08

## 2020-03-05 MED ORDER — GABAPENTIN 100 MG CAPSULE
ORAL_CAPSULE | Freq: Three times a day (TID) | ORAL | 0 refills | 30.00000 days | Status: CP
Start: 2020-03-05 — End: 2020-04-04

## 2020-03-05 MED ORDER — FOLIC ACID 1 MG TABLET
ORAL_TABLET | Freq: Every day | ORAL | 11 refills | 30.00000 days | Status: CP
Start: 2020-03-05 — End: 2020-03-08

## 2020-03-05 MED ORDER — DIVALPROEX 500 MG TABLET,DELAYED RELEASE
ORAL_TABLET | Freq: Every evening | ORAL | 0 refills | 30.00000 days | Status: CP
Start: 2020-03-05 — End: 2020-03-08

## 2020-03-05 MED ADMIN — naltrexone (DEPADE) tablet 50 mg: 50 mg | ORAL | @ 14:00:00 | Stop: 2020-03-08

## 2020-03-05 MED ADMIN — OLANZapine (ZYPREXA) tablet 15 mg: 15 mg | ORAL | @ 03:00:00 | Stop: 2020-03-08

## 2020-03-05 MED ADMIN — sodium chloride tablet 1 g: 1 g | ORAL | @ 23:00:00 | Stop: 2020-03-08

## 2020-03-05 MED ADMIN — gabapentin (NEURONTIN) capsule 200 mg: 200 mg | ORAL | @ 03:00:00 | Stop: 2020-03-08

## 2020-03-05 MED ADMIN — senna (SENOKOT) tablet 1 tablet: 1 | ORAL | @ 03:00:00 | Stop: 2020-03-08

## 2020-03-05 MED ADMIN — lisinopriL (PRINIVIL,ZESTRIL) tablet 40 mg: 40 mg | ORAL | @ 14:00:00 | Stop: 2020-03-08

## 2020-03-05 MED ADMIN — polyethylene glycol (MIRALAX) packet 17 g: 17 g | ORAL | @ 14:00:00 | Stop: 2020-03-08

## 2020-03-05 MED ADMIN — gabapentin (NEURONTIN) capsule 200 mg: 200 mg | ORAL | @ 14:00:00 | Stop: 2020-03-08

## 2020-03-05 MED ADMIN — folic acid (FOLVITE) tablet 1 mg: 1 mg | ORAL | @ 14:00:00 | Stop: 2020-03-08

## 2020-03-05 MED ADMIN — magnesium oxide (MAG-OX) tablet 800 mg: 800 mg | ORAL | @ 14:00:00 | Stop: 2020-03-08

## 2020-03-05 MED ADMIN — divalproex (DEPAKOTE) DR tablet 1,000 mg: 1000 mg | ORAL | @ 03:00:00 | Stop: 2020-03-08

## 2020-03-05 MED ADMIN — nicotine (NICODERM CQ) 21 mg/24 hr patch 1 patch: 1 | TRANSDERMAL | @ 14:00:00 | Stop: 2020-03-08

## 2020-03-05 MED ADMIN — polyethylene glycol (MIRALAX) packet 17 g: 17 g | ORAL | @ 03:00:00 | Stop: 2020-03-08

## 2020-03-05 MED ADMIN — gabapentin (NEURONTIN) capsule 200 mg: 200 mg | ORAL | @ 20:00:00 | Stop: 2020-03-08

## 2020-03-05 MED ADMIN — ibuprofen (MOTRIN) tablet 400 mg: 400 mg | ORAL | @ 14:00:00 | Stop: 2020-03-08

## 2020-03-05 MED ADMIN — enoxaparin (LOVENOX) syringe 40 mg: 40 mg | SUBCUTANEOUS | @ 14:00:00 | Stop: 2020-03-08

## 2020-03-05 NOTE — Unmapped (Signed)
Client is alert and oriented x 4, able to make all needs known to staff. Denies pain at this time. Continent of bowel and bladder.     Problem: Adult Inpatient Plan of Care  Goal: Plan of Care Review  Outcome: Progressing  Goal: Patient-Specific Goal (Individualized)  Outcome: Progressing  Goal: Absence of Hospital-Acquired Illness or Injury  Outcome: Progressing  Goal: Optimal Comfort and Wellbeing  Outcome: Progressing  Goal: Readiness for Transition of Care  Outcome: Progressing  Goal: Rounds/Family Conference  Outcome: Progressing     Problem: Self-Care Deficit  Goal: Improved Ability to Complete Activities of Daily Living  Outcome: Progressing     Problem: Behavioral Health Comorbidity  Goal: Maintenance of Behavioral Health Symptom Control  Outcome: Progressing     Problem: Hypertension Comorbidity  Goal: Blood Pressure in Desired Range  Outcome: Progressing     Problem: Osteoarthritis Comorbidity  Goal: Maintenance of Osteoarthritis Symptom Control  Outcome: Progressing     Problem: Pain Chronic (Persistent) (Comorbidity Management)  Goal: Acceptable Pain Control and Functional Ability  Outcome: Progressing     Problem: Fall Injury Risk  Goal: Absence of Fall and Fall-Related Injury  Outcome: Progressing     Problem: Behavioral Health Comorbidity  Goal: Maintenance of Behavioral Health Symptom Control  Outcome: Progressing     Problem: Hypertension Comorbidity  Goal: Blood Pressure in Desired Range  Outcome: Progressing     Problem: Seizure, Active Management  Goal: Absence of Seizure/Seizure-Related Injury  Outcome: Progressing     Problem: Gas Exchange Impaired  Goal: Optimal Gas Exchange  Outcome: Progressing     Problem: Skin Injury Risk Increased  Goal: Skin Health and Integrity  Outcome: Progressing
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
## 2020-03-05 NOTE — Unmapped (Signed)
Client is alert and oriented x 4, able to make all needs known to staff. Denies pain at this time. Ambulatory with walker and stand-by assist. Continent of bowel and bladder. Up to bedside recliner this shift.     Problem: Adult Inpatient Plan of Care  Goal: Plan of Care Review  Outcome: Progressing  Goal: Patient-Specific Goal (Individualized)  Outcome: Progressing  Goal: Absence of Hospital-Acquired Illness or Injury  Outcome: Progressing  Goal: Optimal Comfort and Wellbeing  Outcome: Progressing  Goal: Readiness for Transition of Care  Outcome: Progressing  Goal: Rounds/Family Conference  Outcome: Progressing     Problem: Self-Care Deficit  Goal: Improved Ability to Complete Activities of Daily Living  Outcome: Progressing     Problem: Behavioral Health Comorbidity  Goal: Maintenance of Behavioral Health Symptom Control  Outcome: Progressing     Problem: Hypertension Comorbidity  Goal: Blood Pressure in Desired Range  Outcome: Progressing     Problem: Osteoarthritis Comorbidity  Goal: Maintenance of Osteoarthritis Symptom Control  Outcome: Progressing     Problem: Pain Chronic (Persistent) (Comorbidity Management)  Goal: Acceptable Pain Control and Functional Ability  Outcome: Progressing     Problem: Fall Injury Risk  Goal: Absence of Fall and Fall-Related Injury  Outcome: Progressing     Problem: Behavioral Health Comorbidity  Goal: Maintenance of Behavioral Health Symptom Control  Outcome: Progressing     Problem: Hypertension Comorbidity  Goal: Blood Pressure in Desired Range  Outcome: Progressing     Problem: Seizure, Active Management  Goal: Absence of Seizure/Seizure-Related Injury  Outcome: Progressing     Problem: Gas Exchange Impaired  Goal: Optimal Gas Exchange  Outcome: Progressing     Problem: Skin Injury Risk Increased  Goal: Skin Health and Integrity  Outcome: Progressing

## 2020-03-05 NOTE — Unmapped (Signed)
Alert and oriented x4. Vss; afebrile. Denies pain. Monitoring intake/output. Fall precautions maintained. All needs met. Will continue to monitor.    Problem: Adult Inpatient Plan of Care  Goal: Plan of Care Review  Outcome: Progressing  Goal: Patient-Specific Goal (Individualized)  Outcome: Progressing  Goal: Absence of Hospital-Acquired Illness or Injury  Outcome: Progressing  Goal: Optimal Comfort and Wellbeing  Outcome: Progressing  Goal: Readiness for Transition of Care  Outcome: Progressing  Goal: Rounds/Family Conference  Outcome: Progressing     Problem: Self-Care Deficit  Goal: Improved Ability to Complete Activities of Daily Living  Outcome: Progressing     Problem: Behavioral Health Comorbidity  Goal: Maintenance of Behavioral Health Symptom Control  Outcome: Progressing     Problem: Hypertension Comorbidity  Goal: Blood Pressure in Desired Range  Outcome: Progressing     Problem: Osteoarthritis Comorbidity  Goal: Maintenance of Osteoarthritis Symptom Control  Outcome: Progressing     Problem: Pain Chronic (Persistent) (Comorbidity Management)  Goal: Acceptable Pain Control and Functional Ability  Outcome: Progressing     Problem: Fall Injury Risk  Goal: Absence of Fall and Fall-Related Injury  Outcome: Progressing     Problem: Behavioral Health Comorbidity  Goal: Maintenance of Behavioral Health Symptom Control  Outcome: Progressing     Problem: Hypertension Comorbidity  Goal: Blood Pressure in Desired Range  Outcome: Progressing     Problem: Seizure, Active Management  Goal: Absence of Seizure/Seizure-Related Injury  Outcome: Progressing     Problem: Gas Exchange Impaired  Goal: Optimal Gas Exchange  Outcome: Progressing     Problem: Skin Injury Risk Increased  Goal: Skin Health and Integrity  Outcome: Progressing
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
## 2020-03-06 LAB — VALPROIC ACID LEVEL, TOTAL: VALPROIC ACID TOTAL: 33.3 ug/mL — ABNORMAL LOW (ref 50.0–100.0)

## 2020-03-06 LAB — SODIUM
SODIUM: 131 mmol/L — ABNORMAL LOW (ref 135–145)
SODIUM: 131 mmol/L — ABNORMAL LOW (ref 135–145)

## 2020-03-06 MED ADMIN — sodium chloride tablet 1 g: 1 g | ORAL | @ 14:00:00

## 2020-03-06 MED ADMIN — magnesium oxide (MAG-OX) tablet 800 mg: 800 mg | ORAL | @ 14:00:00

## 2020-03-06 MED ADMIN — nicotine (NICODERM CQ) 21 mg/24 hr patch 1 patch: 1 | TRANSDERMAL | @ 14:00:00

## 2020-03-06 MED ADMIN — divalproex (DEPAKOTE) DR tablet 1,000 mg: 1000 mg | ORAL | @ 02:00:00 | Stop: 2020-03-08

## 2020-03-06 MED ADMIN — sodium chloride tablet 1 g: 1 g | ORAL | @ 19:00:00

## 2020-03-06 MED ADMIN — polyethylene glycol (MIRALAX) packet 17 g: 17 g | ORAL | @ 02:00:00 | Stop: 2020-03-08

## 2020-03-06 MED ADMIN — gabapentin (NEURONTIN) capsule 200 mg: 200 mg | ORAL | @ 14:00:00

## 2020-03-06 MED ADMIN — gabapentin (NEURONTIN) capsule 200 mg: 200 mg | ORAL | @ 02:00:00 | Stop: 2020-03-08

## 2020-03-06 MED ADMIN — OLANZapine (ZYPREXA) tablet 15 mg: 15 mg | ORAL | @ 02:00:00 | Stop: 2020-03-08

## 2020-03-06 MED ADMIN — enoxaparin (LOVENOX) syringe 40 mg: 40 mg | SUBCUTANEOUS | @ 14:00:00

## 2020-03-06 MED ADMIN — gabapentin (NEURONTIN) capsule 200 mg: 200 mg | ORAL | @ 19:00:00

## 2020-03-06 MED ADMIN — lisinopriL (PRINIVIL,ZESTRIL) tablet 40 mg: 40 mg | ORAL | @ 14:00:00

## 2020-03-06 MED ADMIN — folic acid (FOLVITE) tablet 1 mg: 1 mg | ORAL | @ 14:00:00

## 2020-03-06 MED ADMIN — naltrexone (DEPADE) tablet 50 mg: 50 mg | ORAL | @ 14:00:00

## 2020-03-06 MED ADMIN — sodium chloride tablet 1 g: 1 g | ORAL | @ 22:00:00

## 2020-03-06 MED ADMIN — senna (SENOKOT) tablet 1 tablet: 1 | ORAL | @ 02:00:00 | Stop: 2020-03-08

## 2020-03-06 NOTE — Unmapped (Signed)
Still monitoring I&O. Vss; afebrile. NAD. POC continued.    Problem: Adult Inpatient Plan of Care  Goal: Plan of Care Review  Outcome: Progressing  Goal: Patient-Specific Goal (Individualized)  Outcome: Progressing  Goal: Absence of Hospital-Acquired Illness or Injury  Outcome: Progressing  Intervention: Prevent and Manage VTE (Venous Thromboembolism) Risk  Recent Flowsheet Documentation  Taken 03/05/2020 2000 by Sigmund Hazel, RN  Activity Management: activity adjusted per tolerance  Goal: Optimal Comfort and Wellbeing  Outcome: Progressing  Goal: Readiness for Transition of Care  Outcome: Progressing  Goal: Rounds/Family Conference  Outcome: Progressing     Problem: Self-Care Deficit  Goal: Improved Ability to Complete Activities of Daily Living  Outcome: Progressing     Problem: Behavioral Health Comorbidity  Goal: Maintenance of Behavioral Health Symptom Control  Outcome: Progressing     Problem: Hypertension Comorbidity  Goal: Blood Pressure in Desired Range  Outcome: Progressing     Problem: Osteoarthritis Comorbidity  Goal: Maintenance of Osteoarthritis Symptom Control  Outcome: Progressing  Intervention: Maintain Osteoarthritis Symptom Control  Recent Flowsheet Documentation  Taken 03/05/2020 2000 by Sigmund Hazel, RN  Activity Management: activity adjusted per tolerance     Problem: Pain Chronic (Persistent) (Comorbidity Management)  Goal: Acceptable Pain Control and Functional Ability  Outcome: Progressing     Problem: Fall Injury Risk  Goal: Absence of Fall and Fall-Related Injury  Outcome: Progressing     Problem: Behavioral Health Comorbidity  Goal: Maintenance of Behavioral Health Symptom Control  Outcome: Progressing     Problem: Hypertension Comorbidity  Goal: Blood Pressure in Desired Range  Outcome: Progressing     Problem: Seizure, Active Management  Goal: Absence of Seizure/Seizure-Related Injury  Outcome: Progressing  Intervention: Prevent Seizure-Related Injury  Recent Flowsheet Documentation  Taken 03/06/2020 0000 by Sigmund Hazel, RN  Seizure Precautions: side rails padded  Taken 03/05/2020 2000 by Sigmund Hazel, RN  Seizure Precautions: side rails padded     Problem: Gas Exchange Impaired  Goal: Optimal Gas Exchange  Outcome: Progressing  Intervention: Optimize Oxygenation and Ventilation  Recent Flowsheet Documentation  Taken 03/05/2020 2000 by Sigmund Hazel, RN  Head of Bed [facility]) Positioning: HOB at 30-45 degrees     Problem: Skin Injury Risk Increased  Goal: Skin Health and Integrity  Outcome: Progressing  Intervention: Optimize Skin Protection  Recent Flowsheet Documentation  Taken 03/05/2020 2000 by Sigmund Hazel, RN  Head of Bed [facility]) Positioning: HOB at 30-45 degrees
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
## 2020-03-06 NOTE — Unmapped (Addendum)
[facility] Treatment Plan Note  Assessment/Recommendations: Nicholas Rush is a 55 y.o. male with chronic hyponatremia (baseline in low 130s), bipolar 1 disorder, alcohol abuse, HTN, psoriasis who presented to [facility] on 2/3 with altered mental status 2/2 likely hyponatremia. His sodium was 111. Na corrected in first 24 hours from 111 to 119 which corrected very quickly in first few hours with 1 liter of NS in ED (urine osm 133 and urine 20 on 2/4) and required a couple hours of D5W for overcorrection. Na in next 24 hours from 119 to 126 with NS at 75 ml/hr (urine osm 314 and urine sodium 71). He also had some seizure like activity that may have been from hyponatremia vs alcohol withdrawal. His volume status is documented as being volume down. Since 2/5-2/15 his sodium has remained in 126-130 range and patient has been fluid restricted to 1.5 Liters (urine osm 649 and urine sodium 110) and his current volume status is euvolemic. He takes Olanzapine and Depakote for his bipolar disorder. Nephrology consulted 2/15 for assistance with hyponatremia.    2/16: Na is 131.   ??  #Acute on Chronic Hyponatremia  -Initially appears to have been volume down and improved with fluids. 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  -Needs routine outpatient cancer screening (C-scope & if smoking history CT scan of lungs)  -Would keep fluid restriction at 1.5 liters for now and may need to decrease  -Continue NaCl tablets at 1 g TID for now  -May need to discuss with psychiatry alternate medications for bipolar treatment    Beverly Sessions, DO  [facility] Nephrology Fellow PGY-4  03/06/20

## 2020-03-06 NOTE — Unmapped (Incomplete)
[facility]  {New vs ZO:10960} Psychiatry {Telehealth or In Person:68338::Consult} Note     Service Date: March 06, 2020  LOS:  LOS: 12 days      Assessment:   Nicholas Rush is a 55 y.o. male {pertinent past med / psych history:59461} admitted 02/22/2020 11:14 PM for ***.  Patient was seen in consultation by Psychiatry at the request of Milus Banister, DO with [facility]) for evaluation of {Consult Reason:65627}.     The patient's current presentation of *** is most consistent with ***.  Medical and psychiatric factors contributing to medical decision-making include ***.     Please see below for detailed recommendations.    Diagnoses:   Active Hospital problems:  Principal Problem:    Hyponatremia  Active Problems:    Unspecified mood (affective) disorder (CMS-HCC) rule out mania     Alcohol withdrawal syndrome, with delirium (CMS-HCC)    Essential hypertension    Recurrent falls    Tobacco use disorder       Problems edited/added by me:  No problems updated.    Safety Risk Assessment:  {Safety Assessement:65628}       Recommendations:   ## Safety:   {Safety Recommendations:65632}    ## Medications:   -- ***  {Additional Med Rec Options:59469}    ## Medical Decision Making Capacity:   {Medical Decision Making [phone]::-- A formal capacity assessement was not performed as a part of this evaluation.  If specific capacity questions arise, please contact our team as below. }    ## Further Work-up:   -- ***  {Generic and Specific Work-Up Recs:65844}    ## Disposition:   {Various Disposition Options / Know [facility] if desired:65845}    ## Behavioral / Environmental:   {Behavioral Recs Combined:65846}    Thank you for this consult request. Recommendations have been communicated to the primary team.  We {PSY We will:24115} at this time. Please page {WDS Who To WGNF:62130::*** or [phone] (after hours) } for any questions or concerns.     {CL Resident Attestion:68820::This patient was evaluated in person.}    {CL Staffing Fellow and Attending [phone]    Ulla Gallo, MD      {New vs FU History:58290}:   Relevant Aspects of Hospital Course:   Admitted on 02/22/2020 for ***.    Patient Report:   ***    ROS:   {ROS List Pertinent:65847::All systems reviewed as negative/unremarkable aside from the following pertinent positives and negatives: ***}    Collateral information:   {List of Collateral Sources:63141::- Reviewed medical records in Epic}    Psychiatric History:   {Past Psych History SELECT ALL THAT WNUUV:25366}  {Family Psych History:57171}    Medical History:  Past Medical History:   Diagnosis Date   ??? Anxiety    ??? Bipolar 1 disorder (CMS-HCC)    ??? Depressive disorder    ??? Erectile dysfunction    ??? Esophageal reflux    ??? History of pyloric stenosis    ??? Hypertension    ??? Insomnia    ??? Psoriasis     Dermatologist - Dr. Deloria Lair in [facility]   ??? Seizure (CMS-HCC) 02/20/2019   ??? Tobacco use     1ppd       Surgical History:  Past Surgical History:   Procedure Laterality Date   ??? PYLOROMYOTOMY     ??? WISDOM TOOTH EXTRACTION         Medications:     Current  Facility-Administered Medications:   ???  divalproex (DEPAKOTE) DR tablet 1,000 mg, 1,000 mg, Oral, Nightly, Cleone Slim, MD, 1,000 mg at 03/05/20 2124  ???  enoxaparin (LOVENOX) syringe 40 mg, 40 mg, Subcutaneous, Q24H, Cleone Slim, MD, 40 mg at 03/06/20 1610  ???  folic acid (FOLVITE) tablet 1 mg, 1 mg, Oral, Daily, Cleone Slim, MD, 1 mg at 03/06/20 9604  ???  gabapentin (NEURONTIN) capsule 200 mg, 200 mg, Oral, TID, Cleone Slim, MD, 200 mg at 03/06/20 5409  ???  ibuprofen (MOTRIN) tablet 400 mg, 400 mg, Oral, Q6H PRN, Jolyn Nap, MD, 400 mg at 03/05/20 8119  ???  lisinopriL (PRINIVIL,ZESTRIL) tablet 40 mg, 40 mg, Oral, Daily, Cleone Slim, MD, 40 mg at 03/06/20 1478  ???  magnesium oxide (MAG-OX) tablet 800 mg, 800 mg, Oral, Daily, Cleone Slim, MD, 800 mg at 03/06/20 2956  ???  naltrexone (DEPADE) tablet 50 mg, 50 mg, Oral, Daily, Cleone Slim, MD, 50 mg at 03/06/20 2130  ???  nicotine (NICODERM CQ) 21 mg/24 hr patch 1 patch, 1 patch, Transdermal, Daily, Cleone Slim, MD, 1 patch at 03/06/20 [phone]  ???  nicotine polacrilex (NICORETTE) gum 4 mg, 4 mg, Buccal, Q1H PRN, 4 mg at 02/23/20 1216 **OR** nicotine polacrilex (NICORETTE) lozenge 4 mg, 4 mg, Buccal, Q1H PRN, Cleone Slim, MD  ???  OLANZapine (ZYPREXA) tablet 15 mg, 15 mg, Oral, Nightly, Cleone Slim, MD, 15 mg at 03/05/20 2120  ???  polyethylene glycol (MIRALAX) packet 17 g, 17 g, Oral, BID, Cleone Slim, MD, 17 g at 03/05/20 2121  ???  senna (SENOKOT) tablet 1 tablet, 1 tablet, Oral, Nightly, Cleone Slim, MD, 1 tablet at 03/05/20 2120  ???  sodium chloride tablet 1 g, 1 g, Oral, 3xd Meals, Jolyn Nap, MD, 1 g at 03/06/20 8469    Allergies:  Allergies   Allergen Reactions   ??? Tramadol      Risk of seizures   ??? Hydroxyzine Itching     Sedation.  Ineffective for reducing anxiety.       Social History:   {Social History SELECT ALL THAT GEXBM:84132}    Tobacco use: {Tobacco GMW:10272}  Alcohol use: {EtOH Use INCLUDE AMOUNT AND FREQUENCY:63804}  Drug use: {Drug Use:59262}    Family History: {PSYHX:61224}  The patient's family history includes Alcohol abuse in his maternal aunt and sister; Bipolar disorder in his mother; Depression in his sister.      Objective:   Vital signs:   Temp:  [36.5 ??C-36.9 ??C] 36.7 ??C  Heart Rate:  [68-77] 68  Resp:  [18-20] 18  BP: (98-148)/(58-85) 139/85  MAP (mmHg):  [70-100] 100  SpO2:  [96 %-100 %] 96 %    Physical Exam:  {Phone / Video / In Person ZD:66440}    {CL Mental Status Exam:68919::Mental Status Exam:}  Appearance:  {PSY Appearance:62800}   Attitude:   {WDS PSY attitude:58274}   Behavior/Psychomotor:  {PSY Motor:68053::Unable to assess on phone interview.}   Speech/Language:   {WDS PSY speech:58276}   Mood:  ???***???   Affect:  {PSY affect 5.11.20:66075}   Thought process:  {WDS PSY thought process:57284}   Thought content:    {WDS PSY thought content:57285}   Perceptual disturbances:   {WDS PSY perceptual disturbances:57286}   Attention:  {WDS PSY Attention:63142}   Concentration:  {WDS PSY concentration:58281}   Orientation:  {WDS PSY Orientation:57177}   Memory:  {WDS PSY memory:57287}   Fund of knowledge:   {  WDS PSY NWG:95621}   Insight:    {WDS PSY insight:58283}   Judgment:   {WDS PSY judgment:58284}   Impulse Control:  {WDS PSY impulse control:58285}       Data Reviewed:  {PSY Data Review:43286}    Additional Psychometric Testing:  {Psychometrics:68339}
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
## 2020-03-07 LAB — SODIUM: SODIUM: 131 mmol/L — ABNORMAL LOW (ref 135–145)

## 2020-03-07 LAB — VALPROIC ACID LEVEL, TOTAL: VALPROIC ACID TOTAL: 84.1 ug/mL (ref 50.0–100.0)

## 2020-03-07 MED ORDER — SODIUM CHLORIDE 1 GRAM TABLET
ORAL_TABLET | Freq: Three times a day (TID) | ORAL | 11 refills | 30.00000 days
Start: 2020-03-07 — End: 2021-03-07

## 2020-03-07 MED ADMIN — sodium chloride tablet 1 g: 1 g | ORAL | @ 14:00:00

## 2020-03-07 MED ADMIN — lisinopriL (PRINIVIL,ZESTRIL) tablet 40 mg: 40 mg | ORAL | @ 14:00:00

## 2020-03-07 MED ADMIN — polyethylene glycol (MIRALAX) packet 17 g: 17 g | ORAL | @ 02:00:00

## 2020-03-07 MED ADMIN — senna (SENOKOT) tablet 1 tablet: 1 | ORAL | @ 02:00:00

## 2020-03-07 MED ADMIN — folic acid (FOLVITE) tablet 1 mg: 1 mg | ORAL | @ 14:00:00

## 2020-03-07 MED ADMIN — nicotine (NICODERM CQ) 21 mg/24 hr patch 1 patch: 1 | TRANSDERMAL | @ 14:00:00

## 2020-03-07 MED ADMIN — gabapentin (NEURONTIN) capsule 200 mg: 200 mg | ORAL | @ 02:00:00

## 2020-03-07 MED ADMIN — magnesium oxide (MAG-OX) tablet 800 mg: 800 mg | ORAL | @ 14:00:00

## 2020-03-07 MED ADMIN — naltrexone (DEPADE) tablet 50 mg: 50 mg | ORAL | @ 14:00:00

## 2020-03-07 MED ADMIN — sodium chloride tablet 1 g: 1 g | ORAL | @ 23:00:00

## 2020-03-07 MED ADMIN — ibuprofen (MOTRIN) tablet 400 mg: 400 mg | ORAL | @ 14:00:00

## 2020-03-07 MED ADMIN — sodium chloride tablet 1 g: 1 g | ORAL | @ 18:00:00

## 2020-03-07 MED ADMIN — gabapentin (NEURONTIN) capsule 200 mg: 200 mg | ORAL | @ 14:00:00

## 2020-03-07 MED ADMIN — enoxaparin (LOVENOX) syringe 40 mg: 40 mg | SUBCUTANEOUS | @ 14:00:00

## 2020-03-07 MED ADMIN — gabapentin (NEURONTIN) capsule 200 mg: 200 mg | ORAL | @ 18:00:00

## 2020-03-07 MED ADMIN — OLANZapine (ZYPREXA) tablet 15 mg: 15 mg | ORAL | @ 02:00:00

## 2020-03-07 MED ADMIN — divalproex (DEPAKOTE) DR tablet 1,000 mg: 1000 mg | ORAL | @ 02:00:00

## 2020-03-07 NOTE — Unmapped (Signed)
AAOx4, denies pain, ambulatory with standby assist and walker, continent of bowel and bladder with urinal in use, no PIV at this time.     Problem: Adult Inpatient Plan of Care  Goal: Plan of Care Review  Outcome: Progressing  Goal: Patient-Specific Goal (Individualized)  Outcome: Progressing  Goal: Absence of Hospital-Acquired Illness or Injury  Outcome: Progressing  Intervention: Prevent and Manage VTE (Venous Thromboembolism) Risk  Recent Flowsheet Documentation  Taken 03/06/2020 0800 by Karl Bales, RN  Activity Management: activity adjusted per tolerance  VTE Prevention/Management: anticoagulant therapy  Intervention: Prevent Infection  Recent Flowsheet Documentation  Taken 03/06/2020 0800 by Karl Bales, RN  Infection Prevention:   cohorting utilized   environmental surveillance performed   equipment surfaces disinfected   hand hygiene promoted   personal protective equipment utilized   rest/sleep promoted   single patient room provided   visitors restricted/screened  Goal: Optimal Comfort and Wellbeing  Outcome: Progressing  Goal: Readiness for Transition of Care  Outcome: Progressing  Goal: Rounds/Family Conference  Outcome: Progressing     Problem: Self-Care Deficit  Goal: Improved Ability to Complete Activities of Daily Living  Outcome: Progressing     Problem: Behavioral Health Comorbidity  Goal: Maintenance of Behavioral Health Symptom Control  Outcome: Progressing     Problem: Hypertension Comorbidity  Goal: Blood Pressure in Desired Range  Outcome: Progressing     Problem: Osteoarthritis Comorbidity  Goal: Maintenance of Osteoarthritis Symptom Control  Outcome: Progressing  Intervention: Maintain Osteoarthritis Symptom Control  Recent Flowsheet Documentation  Taken 03/06/2020 0800 by Karl Bales, RN  Activity Management: activity adjusted per tolerance     Problem: Pain Chronic (Persistent) (Comorbidity Management)  Goal: Acceptable Pain Control and Functional Ability  Outcome: Progressing     Problem: Fall Injury Risk  Goal: Absence of Fall and Fall-Related Injury  Outcome: Progressing     Problem: Behavioral Health Comorbidity  Goal: Maintenance of Behavioral Health Symptom Control  Outcome: Progressing     Problem: Hypertension Comorbidity  Goal: Blood Pressure in Desired Range  Outcome: Progressing     Problem: Seizure, Active Management  Goal: Absence of Seizure/Seizure-Related Injury  Outcome: Progressing  Intervention: Prevent Seizure-Related Injury  Recent Flowsheet Documentation  Taken 03/06/2020 0800 by Karl Bales, RN  Seizure Precautions: side rails padded     Problem: Gas Exchange Impaired  Goal: Optimal Gas Exchange  Outcome: Progressing     Problem: Skin Injury Risk Increased  Goal: Skin Health and Integrity  Outcome: Progressing  Intervention: Optimize Skin Protection  Recent Flowsheet Documentation  Taken 03/06/2020 0800 by Karl Bales, RN  Pressure Reduction Techniques: frequent weight shift encouraged  Pressure Reduction Devices: pressure-redistributing mattress utilized
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
## 2020-03-07 NOTE — Unmapped (Signed)
NAD. POC continued.    Problem: Adult Inpatient Plan of Care  Goal: Plan of Care Review  Outcome: Progressing  Goal: Patient-Specific Goal (Individualized)  Outcome: Progressing  Goal: Absence of Hospital-Acquired Illness or Injury  Outcome: Progressing  Intervention: Prevent and Manage VTE (Venous Thromboembolism) Risk  Recent Flowsheet Documentation  Taken 03/06/2020 2000 by Sigmund Hazel, RN  Activity Management: activity adjusted per tolerance  Goal: Optimal Comfort and Wellbeing  Outcome: Progressing  Goal: Readiness for Transition of Care  Outcome: Progressing  Goal: Rounds/Family Conference  Outcome: Progressing     Problem: Self-Care Deficit  Goal: Improved Ability to Complete Activities of Daily Living  Outcome: Progressing     Problem: Behavioral Health Comorbidity  Goal: Maintenance of Behavioral Health Symptom Control  Outcome: Progressing     Problem: Hypertension Comorbidity  Goal: Blood Pressure in Desired Range  Outcome: Progressing     Problem: Osteoarthritis Comorbidity  Goal: Maintenance of Osteoarthritis Symptom Control  Outcome: Progressing  Intervention: Maintain Osteoarthritis Symptom Control  Recent Flowsheet Documentation  Taken 03/06/2020 2000 by Sigmund Hazel, RN  Activity Management: activity adjusted per tolerance     Problem: Pain Chronic (Persistent) (Comorbidity Management)  Goal: Acceptable Pain Control and Functional Ability  Outcome: Progressing     Problem: Fall Injury Risk  Goal: Absence of Fall and Fall-Related Injury  Outcome: Progressing     Problem: Behavioral Health Comorbidity  Goal: Maintenance of Behavioral Health Symptom Control  Outcome: Progressing     Problem: Hypertension Comorbidity  Goal: Blood Pressure in Desired Range  Outcome: Progressing     Problem: Seizure, Active Management  Goal: Absence of Seizure/Seizure-Related Injury  Outcome: Progressing  Intervention: Prevent Seizure-Related Injury  Recent Flowsheet Documentation  Taken 03/07/2020 0400 by Sigmund Hazel, RN  Seizure Precautions: side rails padded  Taken 03/07/2020 0200 by Sigmund Hazel, RN  Seizure Precautions: side rails padded  Taken 03/07/2020 0000 by Sigmund Hazel, RN  Seizure Precautions: side rails padded  Taken 03/06/2020 2200 by Sigmund Hazel, RN  Seizure Precautions: side rails padded  Taken 03/06/2020 2000 by Sigmund Hazel, RN  Seizure Precautions: side rails padded     Problem: Gas Exchange Impaired  Goal: Optimal Gas Exchange  Outcome: Progressing  Intervention: Optimize Oxygenation and Ventilation  Recent Flowsheet Documentation  Taken 03/06/2020 2000 by Sigmund Hazel, RN  Head of Bed [facility]) Positioning: HOB at 30-45 degrees     Problem: Skin Injury Risk Increased  Goal: Skin Health and Integrity  Outcome: Progressing  Intervention: Optimize Skin Protection  Recent Flowsheet Documentation  Taken 03/06/2020 2000 by Sigmund Hazel, RN  Head of Bed [facility] Positioning: HOB at 30-45 degrees

## 2020-03-07 NOTE — Unmapped (Signed)
[facility] Treatment Plan Note  Assessment/Recommendations: Nicholas Rush is a 55 y.o. male with chronic hyponatremia (baseline in low 130s), bipolar 1 disorder, alcohol abuse, HTN, psoriasis who presented to [facility] on 2/3 with altered mental status 2/2 likely hyponatremia. His sodium was 111. Na corrected in first 24 hours from 111 to 119 which corrected very quickly in first few hours with 1 liter of NS in ED (urine osm 133 and urine 20 on 2/4) and required a couple hours of D5W for overcorrection. Na in next 24 hours from 119 to 126 with NS at 75 ml/hr (urine osm 314 and urine sodium 71). He also had some seizure like activity that may have been from hyponatremia vs alcohol withdrawal. His volume status is documented as being volume down. Since 2/5-2/15 his sodium has remained in 126-130 range and patient has been fluid restricted to 1.5 Liters (urine osm 649 and urine sodium 110) and his current volume status is euvolemic. He takes Olanzapine and Depakote for his bipolar disorder. Nephrology consulted 2/15 for assistance with hyponatremia.    2/16: Na is 131.   2/17: Na is 131  ??  #Acute on Chronic Hyponatremia  -Initially appears to have been volume down and improved with fluids. 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  -Needs routine outpatient cancer screening (C-scope/CT lung performed without sign of malignancy)  -Would keep fluid restriction at 1.5 liters for now and may need to decrease  -Continue NaCl tablets at 1 g TID  -May need to discuss with psychiatry alternate medications for bipolar treatment  -Will arrange for follow up in April    Nephrology will sign off. Please call back for any questions      Beverly Sessions, DO  [facility] Nephrology Fellow PGY-4  03/07/20

## 2020-03-08 LAB — SODIUM: SODIUM: 133 mmol/L — ABNORMAL LOW (ref 135–145)

## 2020-03-08 MED ORDER — LISINOPRIL 40 MG TABLET
ORAL_TABLET | Freq: Every day | ORAL | 0 refills | 30.00000 days | Status: SS
Start: 2020-03-08 — End: 2020-04-07

## 2020-03-08 MED ORDER — OLANZAPINE 15 MG TABLET
ORAL_TABLET | Freq: Every evening | ORAL | 0 refills | 30 days | Status: SS
Start: 2020-03-08 — End: 2020-04-07

## 2020-03-08 MED ORDER — FOLIC ACID 1 MG TABLET
ORAL_TABLET | Freq: Every day | ORAL | 11 refills | 30.00000 days | Status: SS
Start: 2020-03-08 — End: 2021-03-08

## 2020-03-08 MED ORDER — GABAPENTIN 100 MG CAPSULE
ORAL_CAPSULE | Freq: Three times a day (TID) | ORAL | 0 refills | 30 days | Status: SS
Start: 2020-03-08 — End: 2020-04-07

## 2020-03-08 MED ORDER — NICOTINE 21 MG/24 HR DAILY TRANSDERMAL PATCH
MEDICATED_PATCH | Freq: Every day | TRANSDERMAL | 0 refills | 28.00000 days | Status: SS
Start: 2020-03-08 — End: ?

## 2020-03-08 MED ORDER — DIVALPROEX 500 MG TABLET,DELAYED RELEASE
ORAL_TABLET | Freq: Every evening | ORAL | 0 refills | 30 days | Status: SS
Start: 2020-03-08 — End: 2020-04-07

## 2020-03-08 MED ORDER — NICOTINE (POLACRILEX) 4 MG GUM
BUCCAL | 0 refills | 5 days | Status: SS | PRN
Start: 2020-03-08 — End: 2020-04-07

## 2020-03-08 MED ADMIN — OLANZapine (ZYPREXA) tablet 15 mg: 15 mg | ORAL | @ 01:00:00

## 2020-03-08 MED ADMIN — folic acid (FOLVITE) tablet 1 mg: 1 mg | ORAL | @ 15:00:00 | Stop: 2020-03-08

## 2020-03-08 MED ADMIN — polyethylene glycol (MIRALAX) packet 17 g: 17 g | ORAL | @ 01:00:00

## 2020-03-08 MED ADMIN — magnesium oxide (MAG-OX) tablet 800 mg: 800 mg | ORAL | @ 15:00:00 | Stop: 2020-03-08

## 2020-03-08 MED ADMIN — senna (SENOKOT) tablet 1 tablet: 1 | ORAL | @ 01:00:00

## 2020-03-08 MED ADMIN — ibuprofen (MOTRIN) tablet 400 mg: 400 mg | ORAL | @ 01:00:00

## 2020-03-08 MED ADMIN — gabapentin (NEURONTIN) capsule 200 mg: 200 mg | ORAL | @ 01:00:00

## 2020-03-08 MED ADMIN — divalproex (DEPAKOTE) DR tablet 1,000 mg: 1000 mg | ORAL | @ 01:00:00

## 2020-03-08 MED ADMIN — ibuprofen (MOTRIN) tablet 400 mg: 400 mg | ORAL | @ 15:00:00 | Stop: 2020-03-08

## 2020-03-08 MED ADMIN — nicotine (NICODERM CQ) 21 mg/24 hr patch 1 patch: 1 | TRANSDERMAL | @ 15:00:00 | Stop: 2020-03-08

## 2020-03-08 MED ADMIN — sodium chloride tablet 1 g: 1 g | ORAL | @ 23:00:00 | Stop: 2020-03-08

## 2020-03-08 MED ADMIN — gabapentin (NEURONTIN) capsule 200 mg: 200 mg | ORAL | @ 19:00:00 | Stop: 2020-03-08

## 2020-03-08 MED ADMIN — naltrexone (DEPADE) tablet 50 mg: 50 mg | ORAL | @ 15:00:00 | Stop: 2020-03-08

## 2020-03-08 MED ADMIN — enoxaparin (LOVENOX) syringe 40 mg: 40 mg | SUBCUTANEOUS | @ 15:00:00 | Stop: 2020-03-08

## 2020-03-08 MED ADMIN — gabapentin (NEURONTIN) capsule 200 mg: 200 mg | ORAL | @ 15:00:00 | Stop: 2020-03-08

## 2020-03-08 MED ADMIN — lisinopriL (PRINIVIL,ZESTRIL) tablet 40 mg: 40 mg | ORAL | @ 15:00:00 | Stop: 2020-03-08

## 2020-03-08 MED ADMIN — sodium chloride tablet 1 g: 1 g | ORAL | @ 17:00:00 | Stop: 2020-03-08

## 2020-03-08 MED ADMIN — sodium chloride tablet 1 g: 1 g | ORAL | @ 15:00:00 | Stop: 2020-03-08

## 2020-03-08 NOTE — Unmapped (Signed)
NicholasRush has missed his last 2 doses of Humira.  He is currently inpatient and is being moved to psych care at [facility].  His wife reports flaring of psoriasis due to missed doses and Dr. Stacie Acres is okay with restarting.  She will use the 1 pen they have on hand and [facility] will ship another shipment.  She also request calling her from this point forward for Humira issues.        [facility] Clinical Assessment & Refill Coordination Note    Nicholas Rush, DOB: [birth year]  Phone: [phone] (home)     All above HIPAA information was verified with patient's family member, wife.     Was a Nurse, learning disability used for this call? No    Specialty Medication(s):   Inflammatory Disorders: Humira     No current facility-administered medications for this visit.     Current Outpatient Medications   Medication Sig Dispense Refill   ??? divalproex (DEPAKOTE) 500 MG DR tablet Take 2 tablets (1,000 mg total) by mouth nightly. 60 tablet 0   ??? folic acid (FOLVITE) 1 MG tablet Take 1 tablet (1 mg total) by mouth daily. 30 tablet 11   ??? gabapentin (NEURONTIN) 100 MG capsule Take 2 capsules (200 mg total) by mouth Three (3) times a day. 180 capsule 0   ??? lisinopriL (PRINIVIL,ZESTRIL) 40 MG tablet Take 1 tablet (40 mg total) by mouth daily. 30 tablet 0   ??? naltrexone (DEPADE) 50 mg tablet Take 1 tablet (50 mg total) by mouth daily. 30 tablet 11   ??? nicotine (NICODERM CQ) 21 mg/24 hr patch Place 1 patch on the skin daily. 28 patch 0   ??? nicotine polacrilex (NICORETTE) 4 MG gum Apply 1 each (4 mg total) to cheek every hour as needed for smoking cessation. 110 each 0   ??? OLANZapine (ZYPREXA) 15 MG tablet Take 1 tablet (15 mg total) by mouth nightly. 30 tablet 0     Facility-Administered Medications Ordered in Other Visits   Medication Dose Route Frequency Provider Last Rate Last Admin   ??? divalproex (DEPAKOTE) DR tablet 1,000 mg  1,000 mg Oral Nightly Cleone Slim, MD   1,000 mg at 03/07/20 2023   ??? enoxaparin (LOVENOX) syringe 40 mg  40 mg Subcutaneous Q24H Cleone Slim, MD   40 mg at 03/08/20 0959   ??? folic acid (FOLVITE) tablet 1 mg  1 mg Oral Daily Cleone Slim, MD   1 mg at 03/08/20 0957   ??? gabapentin (NEURONTIN) capsule 200 mg  200 mg Oral TID Cleone Slim, MD   200 mg at 03/08/20 1416   ??? ibuprofen (MOTRIN) tablet 400 mg  400 mg Oral Q6H PRN Jolyn Nap, MD   400 mg at 03/08/20 1002   ??? lisinopriL (PRINIVIL,ZESTRIL) tablet 40 mg  40 mg Oral Daily Cleone Slim, MD   40 mg at 03/08/20 0957   ??? magnesium oxide (MAG-OX) tablet 800 mg  800 mg Oral Daily Cleone Slim, MD   800 mg at 03/08/20 0981   ??? naltrexone (DEPADE) tablet 50 mg  50 mg Oral Daily Cleone Slim, MD   50 mg at 03/08/20 1914   ??? nicotine (NICODERM CQ) 21 mg/24 hr patch 1 patch  1 patch Transdermal Daily Cleone Slim, MD   1 patch at 03/08/20 [phone]   ??? nicotine polacrilex (NICORETTE) gum 4 mg  4 mg Buccal Q1H PRN Cleone Slim,  MD   4 mg at 02/23/20 1216    Or   ??? nicotine polacrilex (NICORETTE) lozenge 4 mg  4 mg Buccal Q1H PRN Cleone Slim, MD       ??? OLANZapine (ZYPREXA) tablet 15 mg  15 mg Oral Nightly Cleone Slim, MD   15 mg at 03/07/20 2023   ??? polyethylene glycol (MIRALAX) packet 17 g  17 g Oral BID Cleone Slim, MD   17 g at 03/07/20 2023   ??? senna (SENOKOT) tablet 1 tablet  1 tablet Oral Nightly Cleone Slim, MD   1 tablet at 03/07/20 2023   ??? sodium chloride tablet 1 g  1 g Oral 3xd Meals Jolyn Nap, MD   1 g at 03/08/20 1211      Changes to medications: being admitted to psych care to wean off Depakote.  Possible other change to medicine while inpatient    Allergies   Allergen Reactions   ??? Tramadol      Risk of seizures   ??? Hydroxyzine Itching     Sedation.  Ineffective for reducing anxiety.       Changes to allergies: No    SPECIALTY MEDICATION ADHERENCE     Humira 40/0.4 mg/ml: 0 days of medicine on hand       Medication Adherence    Patient reported X missed doses in the last month: 2  Specialty Medication: Humira 40 mg/0.4 ml  Patient is on additional specialty medications: No  Informant: spouse  Confirmed plan for next specialty medication refill: delivery by pharmacy  Refills needed for supportive medications: not needed          Specialty medication(s) dose(s) confirmed: Regimen is correct and unchanged.     Are there any concerns with adherence? No    Adherence counseling provided? Not needed    CLINICAL MANAGEMENT AND INTERVENTION      Clinical Benefit Assessment:    Do you feel the medicine is effective or helping your condition? Yes    Clinical Benefit counseling provided? has flaring due to 2 missed dosing.      Adverse Effects Assessment:    Are you experiencing any side effects? No    Are you experiencing difficulty administering your medicine? No    Quality of Life Assessment:    How many days over the past month did your psoriasis  keep you from your normal activities? For example, brushing your teeth or getting up in the morning. 0    Have you discussed this with your provider? Not needed    Therapy Appropriateness:    Is therapy appropriate? Yes, therapy is appropriate and should be continued    DISEASE/MEDICATION-SPECIFIC INFORMATION      For patients on injectable medications: Patient currently has 0 doses left.  Next injection is scheduled for ASAP.    PATIENT SPECIFIC NEEDS     - Does the patient have any physical, cognitive, or cultural barriers? No    - Is the patient high risk? No    - Does the patient require a Care Management Plan? No     - Does the patient require physician intervention or other additional services (i.e. nutrition, smoking cessation, social work)? No      SHIPPING     Specialty Medication(s) to be Shipped:   Inflammatory Disorders: Humira    Other medication(s) to be shipped: No additional medications requested for fill at this time     Changes to insurance: No  Delivery Scheduled: Yes, Expected medication delivery date: 03/11/20.     Medication will be delivered via Same Day Courier to the confirmed prescription address in [facility].    The patient will receive a drug information handout for each medication shipped and additional FDA Medication Guides as required.  Verified that patient has previously received a Conservation officer, historic buildings.    All of the patient's questions and concerns have been addressed.    Neko Mcgeehan Vangie Bicker   [facility] Specialty Pharmacist

## 2020-03-08 NOTE — Unmapped (Addendum)
Outpatient Physical Therapy and Occupational Therapy:    Outpatient Physical therapy appt with [facility]: ??  Address: [address] [place], [place] [address]  [phone])     03/15/2020 for Physical Therapy   04/03/2020 for Occupational Therapy

## 2020-03-08 NOTE — Unmapped (Signed)
Problem: Behavioral Health Comorbidity  Goal: Maintenance of Behavioral Health Symptom Control  Outcome: Progressing     Problem: Hypertension Comorbidity  Goal: Blood Pressure in Desired Range  Outcome: Progressing     Problem: Hypertension Comorbidity  Goal: Blood Pressure in Desired Range  Outcome: Progressing     Problem: Skin Injury Risk Increased  Goal: Skin Health and Integrity  Intervention: Optimize Skin Protection  Recent Flowsheet Documentation  Taken 03/07/2020 2025 by Corinna Capra, RN  Pressure Reduction Techniques: frequent weight shift encouraged

## 2020-03-08 NOTE — Unmapped (Signed)
Pharmacist Discharge Note  Patient Name: Nicholas Rush  Reason for admission: Hyponatremia  Reason for writing this note: patient requires medication-related outpatient intervention and/or monitoring    Highlighted medication changes with rationale (if applicable):  - Patient being discharged on sodium chloride tablets    Medication access:  - No barriers identified    Outpatient follow-up:  [ ]  Na    Cherlynn Polo   Clinical Pharmacist    Future Appointments   Date Time Provider Department Center   03/11/2020  9:00 AM Will Karsten Fells, MD [facility]   04/22/2020  9:00 AM [name], DO [facility]   04/29/2020  9:15 AM Will Karsten Fells, MD [facility]   06/03/2020 10:30 AM Will Karsten Fells, MD [facility]   07/08/2020  9:15 AM Will Karsten Fells, MD [facility]

## 2020-03-08 NOTE — Unmapped (Signed)
Pt with no falls or injuries this shift, and able to ambulate with 1-2 person assist to bathroom/chair.  Bed low and locked, side rails up x 2, and call bell within reach.  Vitals WNL.  No signs/symptoms of infection.  Pt with no complaints of pain.  Pt tolerating diet.  Will continue to monitor.    Problem: Adult Inpatient Plan of Care  Goal: Plan of Care Review  Outcome: Progressing  Goal: Patient-Specific Goal (Individualized)  Outcome: Progressing  Goal: Absence of Hospital-Acquired Illness or Injury  Outcome: Progressing  Intervention: Prevent and Manage VTE (Venous Thromboembolism) Risk  Recent Flowsheet Documentation  Taken 03/07/2020 0900 by Francena Hanly, RN  VTE Prevention/Management: anticoagulant therapy  Taken 03/07/2020 0800 by Francena Hanly, RN  Activity Management: activity adjusted per tolerance  Goal: Optimal Comfort and Wellbeing  Outcome: Progressing  Goal: Readiness for Transition of Care  Outcome: Progressing  Goal: Rounds/Family Conference  Outcome: Progressing     Problem: Self-Care Deficit  Goal: Improved Ability to Complete Activities of Daily Living  Outcome: Progressing     Problem: Behavioral Health Comorbidity  Goal: Maintenance of Behavioral Health Symptom Control  Outcome: Progressing     Problem: Hypertension Comorbidity  Goal: Blood Pressure in Desired Range  Outcome: Progressing     Problem: Osteoarthritis Comorbidity  Goal: Maintenance of Osteoarthritis Symptom Control  Outcome: Progressing  Intervention: Maintain Osteoarthritis Symptom Control  Recent Flowsheet Documentation  Taken 03/07/2020 0800 by Francena Hanly, RN  Activity Management: activity adjusted per tolerance     Problem: Pain Chronic (Persistent) (Comorbidity Management)  Goal: Acceptable Pain Control and Functional Ability  Outcome: Progressing     Problem: Fall Injury Risk  Goal: Absence of Fall and Fall-Related Injury  Outcome: Progressing     Problem: Behavioral Health Comorbidity  Goal: Maintenance of Behavioral Health Symptom Control  Outcome: Progressing     Problem: Hypertension Comorbidity  Goal: Blood Pressure in Desired Range  Outcome: Progressing     Problem: Seizure, Active Management  Goal: Absence of Seizure/Seizure-Related Injury  Outcome: Progressing  Intervention: Prevent Seizure-Related Injury  Recent Flowsheet Documentation  Taken 03/07/2020 0800 by Francena Hanly, RN  Seizure Precautions: side rails padded     Problem: Gas Exchange Impaired  Goal: Optimal Gas Exchange  Outcome: Progressing  Intervention: Optimize Oxygenation and Ventilation  Recent Flowsheet Documentation  Taken 03/07/2020 0800 by Francena Hanly, RN  Head of Bed [facility]) Positioning: HOB at 30 degrees     Problem: Skin Injury Risk Increased  Goal: Skin Health and Integrity  Outcome: Progressing  Intervention: Optimize Skin Protection  Recent Flowsheet Documentation  Taken 03/07/2020 0800 by Francena Hanly, RN  Head of Bed [facility]) Positioning: HOB at 30 degrees

## 2020-03-08 NOTE — Unmapped (Signed)
Pt accepted for inpatient psych care at [facility]. Anticipate transfer today.

## 2020-03-09 ENCOUNTER — Ambulatory Visit: Admit: 2020-03-09 | Discharge: 2020-03-09 | Discharge status: Against Medical Advice | Payer: MEDICAID

## 2020-03-09 ENCOUNTER — Ambulatory Visit
Admission: TF | Admit: 2020-03-09 | Discharge: 2020-03-15 | Disposition: A | Discharge status: Home / Self Care | Payer: MEDICAID | Source: Intra-hospital

## 2020-03-09 MED ADMIN — sodium chloride tablet 1 g: 1 g | ORAL | @ 22:00:00

## 2020-03-09 MED ADMIN — magnesium oxide (MAG-OX) tablet 800 mg: 800 mg | ORAL | @ 14:00:00

## 2020-03-09 MED ADMIN — sodium chloride tablet 1 g: 1 g | ORAL | @ 17:00:00

## 2020-03-09 MED ADMIN — naltrexone (DEPADE) tablet 50 mg: 50 mg | ORAL | @ 14:00:00

## 2020-03-09 MED ADMIN — thiamine mononitrate (vit B1) tablet 100 mg: 100 mg | ORAL | @ 14:00:00

## 2020-03-09 MED ADMIN — gabapentin (NEURONTIN) capsule 200 mg: 200 mg | ORAL | @ 14:00:00

## 2020-03-09 MED ADMIN — polyethylene glycol (MIRALAX) packet 17 g: 17 g | ORAL | @ 14:00:00

## 2020-03-09 MED ADMIN — OLANZapine (ZYPREXA) tablet 15 mg: 15 mg | ORAL | @ 01:00:00 | Stop: 2020-03-08

## 2020-03-09 MED ADMIN — nicotine (NICODERM CQ) 21 mg/24 hr patch 1 patch: 1 | TRANSDERMAL | @ 14:00:00

## 2020-03-09 MED ADMIN — divalproex (DEPAKOTE) DR tablet 1,000 mg: 1000 mg | ORAL | @ 01:00:00 | Stop: 2020-03-08

## 2020-03-09 MED ADMIN — ibuprofen (MOTRIN) tablet 400 mg: 400 mg | ORAL | @ 14:00:00

## 2020-03-09 MED ADMIN — lisinopriL (PRINIVIL,ZESTRIL) tablet 40 mg: 40 mg | ORAL | @ 14:00:00

## 2020-03-09 MED ADMIN — folic acid (FOLVITE) tablet 1 mg: 1 mg | ORAL | @ 14:00:00

## 2020-03-09 MED ADMIN — gabapentin (NEURONTIN) capsule 200 mg: 200 mg | ORAL | @ 20:00:00

## 2020-03-09 MED ADMIN — sodium chloride tablet 1 g: 1 g | ORAL | @ 14:00:00

## 2020-03-09 MED ADMIN — gabapentin (NEURONTIN) capsule 200 mg: 200 mg | ORAL | @ 01:00:00 | Stop: 2020-03-08

## 2020-03-09 NOTE — Unmapped (Signed)
Patient is a transfer from [facility] who arrived on the CRISIS unit @ 2346 last night, accompanied by hospital police and ED RN. Pt is alert and oriented X4, no acute distress, denies any pain, he is calm, cooperative and appropriate during the admission process. He denies SI/HI/AVH and no behavioral issues noted. Says he feels safe and comfortable on the unit. Admission assessment done. Skin checked and belongings searched. No cuts and no contraband found. VSS. Oriented to the unit rules and guidelines. Patient uses wheelchair and walker for mobility and requires one person assist with ADLs. Bed alarm engaged. Side rails up and padded. Will continue to monitor.  Problem: Adult Behavioral Health Plan of Care  Goal: Plan of Care Review  Outcome: Progressing  Flowsheets (Taken 03/09/2020 0226)  Progress: improving  Plan of Care Reviewed With: patient  Patient Agreement with Plan of Care: agrees  Goal: Patient-Specific Goal (Individualization)  Outcome: Progressing  Flowsheets (Taken 03/09/2020 0226)  Patient-Specific Goals (Include Timeframe): To get better and go home, by 03/18/2020  Patient Personal Strengths:   expressive of emotions   expressive of needs   medication/treatment adherence  Goal: Adheres to Safety Considerations for Self and Others  Outcome: Progressing  Goal: Absence of New-Onset Illness or Injury  Outcome: Progressing  Intervention: Prevent Infection  Recent Flowsheet Documentation  Taken 03/09/2020 0000 by Ronnell Guadalajara, RN  Infection Prevention:   hand hygiene promoted   rest/sleep promoted  Goal: Optimized Coping Skills in Response to Life Stressors  Outcome: Progressing  Goal: Develops/Participates in Therapeutic Alliance to Support Successful Transition  Outcome: Progressing  Goal: Rounds/Family Conference  Outcome: Progressing     Problem: Self-Care Deficit  Goal: Improved Ability to Complete Activities of Daily Living  Outcome: Progressing     Problem: Fall Injury Risk  Goal: Absence of Fall and Fall-Related Injury  Outcome: Progressing  Intervention: Promote Injury-Free Environment  Recent Flowsheet Documentation  Taken 03/09/2020 0000 by Ronnell Guadalajara, RN  Safety Interventions:   assistive device   bed alarm   fall reduction program maintained   low bed   mobility aid   nonskid shoes/slippers when out of bed   room near unit station

## 2020-03-09 NOTE — Unmapped (Signed)
Patient was sent via police to main campus psy, patient was transported from room in restraints with police and RN to ER to police car,

## 2020-03-09 NOTE — Unmapped (Signed)
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   Result Value Ref Range    Alcohol, Ethyl <10.0 <=10.0 mg/dL   Sodium   Result Value Ref Range    Sodium 117 (L) 135 - 145 mmol/L   Sodium   Result Value Ref Range    Sodium 110 (LL) 135 - 145 mmol/L   Sodium   Result Value Ref Range    Sodium 116 (L) 135 - 145 mmol/L   Sodium   Result Value Ref Range    Sodium 119 (L) 135 - 145 mmol/L   Sodium   Result Value Ref Range    Sodium 121 (L) 135 - 145 mmol/L   Sodium   Result Value Ref Range    Sodium 120 (L) 135 - 145 mmol/L   Magnesium Level   Result Value Ref Range    Magnesium 1.5 (L) 1.6 - 2.6 mg/dL   Comprehensive metabolic panel   Result Value Ref Range    Sodium 116 (L) 135 - 145 mmol/L    Potassium 4.0 3.4 - 4.5 mmol/L    Chloride 80 (L) 98 - 107 mmol/L    Anion Gap 14 5 - 14 mmol/L    CO2 22.0 20.0 - 31.0 mmol/L    BUN 6 (L) 9 - 23 mg/dL    Creatinine 1.91 4.78 - 1.10 mg/dL    BUN/Creatinine Ratio 10     EGFR CKD-EPI Non-African American, Male >90 >=60 mL/min/1.49m2    EGFR CKD-EPI African American, Male >90 >=60 mL/min/1.76m2  Glucose 74 70 - 179 mg/dL    Calcium 9.4 8.7 - 03.4 mg/dL    Albumin 3.6 3.4 - 5.0 g/dL    Total Protein 7.2 5.7 - 8.2 g/dL    Total Bilirubin 0.6 0.3 - 1.2 mg/dL    AST 742 (H) <=59 U/L    ALT 41 10 - 49 U/L    Alkaline Phosphatase 88 46 - 116 U/L   CBC   Result Value Ref Range    WBC 5.1 4.5 - 11.0 10*9/L    RBC 3.60 (L) 4.50 - 5.90 10*12/L    HGB 12.6 (L) 13.5 - 17.5 g/dL    HCT 56.3 (L) 87.5 - 53.0 %    MCV 98.6 80.0 - 100.0 fL    MCH 35.0 (H) 26.0 - 34.0 pg    MCHC 35.5 31.0 - 37.0 g/dL    RDW 64.3 32.9 - 51.8 %    MPV 9.0 7.0 - 10.0 fL    Platelet 157 150 - 440 10*9/L   Sodium, Random Urine   Result Value Ref Range    Sodium, Ur 20 mmol/L   Osmolality, Random Urine   Result Value Ref Range    Osmolality, Ur 133 mOsm/kg   TSH   Result Value Ref Range    TSH 0.445 (L) 0.550 - 4.780 uIU/mL   Osmolality, Serum   Result Value Ref Range    Osmolality Meas 249 (L) 275 - 295 mOsm/kg   T3, Free   Result Value Ref Range    T3, Free 2.35 2.30 - 4.20 pg/mL   T4, Free   Result Value Ref Range    Free T4 1.81 (H) 0.89 - 1.76 ng/dL   Sodium   Result Value Ref Range    Sodium 122 (L) 135 - 145 mmol/L   Sodium   Result Value Ref Range    Sodium 122 (L) 135 - 145 mmol/L   Sodium   Result Value Ref Range    Sodium 121 (L) 135 - 145 mmol/L   Sodium   Result Value Ref Range    Sodium 123 (L) 135 - 145 mmol/L   Comprehensive Metabolic Panel   Result Value Ref Range    Sodium 121 (L) 135 - 145 mmol/L    Potassium 3.6 3.4 - 4.5 mmol/L    Chloride 86 (L) 98 - 107 mmol/L    Anion Gap 13 5 - 14 mmol/L    CO2 21.7 20.0 - 31.0 mmol/L    BUN 12 9 - 23 mg/dL    Creatinine 8.41 6.60 - 1.10 mg/dL    BUN/Creatinine Ratio 17     EGFR CKD-EPI Non-African American, Male >90 >=60 mL/min/1.42m2    EGFR CKD-EPI African American, Male >90 >=60 mL/min/1.23m2    Glucose 155 70 - 179 mg/dL    Calcium 9.5 8.7 - 63.0 mg/dL    Albumin 3.6 3.4 - 5.0 g/dL    Total Protein 7.3 5.7 - 8.2 g/dL    Total Bilirubin 0.3 0.3 - 1.2 mg/dL    AST 160 (H) <=10 U/L    ALT 48 10 - 49 U/L    Alkaline Phosphatase 122 (H) 46 - 116 U/L   Magnesium Level   Result Value Ref Range    Magnesium 1.8 1.6 - 2.6 mg/dL   Phosphorus Level   Result Value Ref Range    Phosphorus 2.8 2.4 - 5.1 mg/dL   PT-INR   Result Value Ref Range    PT 11.4  10.3 - 13.4 sec    INR 0.97    aPTT   Result Value Ref Range    APTT 36.4 24.9 - 36.9 sec    Heparin Correlation 0.2    Blood Gas, Venous   Result Value Ref Range    Specimen Source Venous     FIO2 Venous Not Specified     pH, Venous 7.46 (H) 7.32 - 7.43    pCO2, Ven 33 (L) 40 - 60 mm Hg    pO2, Ven 76 (H) 30 - 55 mm Hg    HCO3, Ven 24 22 - 27 mmol/L    Base Excess, Ven -0.5 -2.0 - 2.0    O2 Saturation, Venous 96.2 (H) 40.0 - 85.0 %   Lactate, Venous, Whole Blood   Result Value Ref Range    Lactate, Venous 8.1 (HH) 0.5 - 1.8 mmol/L   Valproic Acid Level   Result Value Ref Range    Valproic Acid, Total 26.0 (L) 50.0 - 100.0 ug/mL   Toxicology Screen, Urine   Result Value Ref Range    Amphetamines Screen, Ur Negative <500 ng/mL    Barbiturates Screen, Ur Negative <200 ng/mL    Benzodiazepines Screen, Urine Negative <200 ng/mL    Cannabinoids Screen, Ur Negative <20 ng/mL    Methadone Screen, Urine Negative <300 ng/mL    Cocaine(Metab.)Screen, Urine Negative <150 ng/mL    Opiates Screen, Ur Negative <300 ng/mL    Fentanyl Screen, Ur Negative <1.0 ng/mL    Oxycodone Screen, Ur Negative <100 ng/mL    Buprenorphine, Urine Negative <5 ng/mL   Magnesium Level   Result Value Ref Range    Magnesium 1.5 (L) 1.6 - 2.6 mg/dL   Comprehensive metabolic panel   Result Value Ref Range    Sodium 122 (L) 135 - 145 mmol/L    Potassium 3.3 (L) 3.4 - 4.5 mmol/L    Chloride 86 (L) 98 - 107 mmol/L    Anion Gap 5 5 - 14 mmol/L    CO2 30.8 20.0 - 31.0 mmol/L    BUN 11 9 - 23 mg/dL    Creatinine 1.61 0.96 - 1.10 mg/dL    BUN/Creatinine Ratio 15     EGFR CKD-EPI Non-African American, Male >90 >=60 mL/min/1.45m2    EGFR CKD-EPI African American, Male >90 >=60 mL/min/1.72m2    Glucose 109 70 - 179 mg/dL    Calcium 9.3 8.7 - 04.5 mg/dL    Albumin 3.5 3.4 - 5.0 g/dL    Total Protein 7.1 5.7 - 8.2 g/dL    Total Bilirubin 0.4 0.3 - 1.2 mg/dL    AST 85 (H) <=40 U/L    ALT 48 10 - 49 U/L    Alkaline Phosphatase 92 46 - 116 U/L   CBC   Result Value Ref Range    WBC 5.8 3.5 - 10.5 10*9/L    RBC 3.58 (L) 4.32 - 5.72 10*12/L    HGB 11.8 (L) 13.5 - 17.5 g/dL    HCT 98.1 (L) 19.1 - 50.0 %    MCV 95.5 (H) 81.0 - 95.0 fL    MCH 32.9 26.0 - 34.0 pg    MCHC 34.4 30.0 - 36.0 g/dL    RDW 47.8 29.5 - 62.1 %    MPV 7.5 7.0 - 10.0 fL    Platelet 130 (L) 150 - 450 10*9/L   Sodium   Result Value Ref Range    Sodium 126 (L) 135 - 145 mmol/L   Lactate, Venous,  Whole Blood   Result Value Ref Range    Lactate, Venous 1.7 0.5 - 1.8 mmol/L   Sodium   Result Value Ref Range    Sodium 122 (L) 135 - 145 mmol/L   Sodium   Result Value Ref Range    Sodium 123 (L) 135 - 145 mmol/L   Cortisol   Result Value Ref Range    Cortisol 14.9 See Comment ug/dL   Osmolality, Random Urine   Result Value Ref Range    Osmolality, Ur 314 mOsm/kg   Sodium, Random Urine   Result Value Ref Range    Sodium, Ur 71 mmol/L   Sodium   Result Value Ref Range    Sodium 123 (L) 135 - 145 mmol/L   Sodium   Result Value Ref Range    Sodium 124 (L) 135 - 145 mmol/L   Sodium   Result Value Ref Range    Sodium 123 (L) 135 - 145 mmol/L   Sodium   Result Value Ref Range    Sodium 125 (L) 135 - 145 mmol/L   Sodium   Result Value Ref Range    Sodium 123 (L) 135 - 145 mmol/L   Sodium   Result Value Ref Range    Sodium 126 (L) 135 - 145 mmol/L   Magnesium Level   Result Value Ref Range    Magnesium 1.4 (L) 1.6 - 2.6 mg/dL   Comprehensive metabolic panel   Result Value Ref Range    Sodium 125 (L) 135 - 145 mmol/L    Potassium 3.8 3.4 - 4.5 mmol/L    Chloride 93 (L) 98 - 107 mmol/L    Anion Gap 5 5 - 14 mmol/L    CO2 26.7 20.0 - 31.0 mmol/L    BUN 10 9 - 23 mg/dL    Creatinine 1.61 0.96 - 1.10 mg/dL    BUN/Creatinine Ratio 16     EGFR CKD-EPI Non-African American, Male >90 >=60 mL/min/1.60m2    EGFR CKD-EPI African American, Male >90 >=60 mL/min/1.86m2    Glucose 105 70 - 179 mg/dL    Calcium 8.8 8.7 - 04.5 mg/dL    Albumin 3.1 (L) 3.4 - 5.0 g/dL    Total Protein 6.6 5.7 - 8.2 g/dL    Total Bilirubin 0.3 0.3 - 1.2 mg/dL    AST 47 (H) <=40 U/L    ALT 39 10 - 49 U/L    Alkaline Phosphatase 90 46 - 116 U/L   CBC   Result Value Ref Range    WBC 5.8 3.5 - 10.5 10*9/L    RBC 3.27 (L) 4.32 - 5.72 10*12/L    HGB 11.1 (L) 13.5 - 17.5 g/dL    HCT 98.1 (L) 19.1 - 50.0 %    MCV 96.1 (H) 81.0 - 95.0 fL    MCH 33.8 26.0 - 34.0 pg    MCHC 35.2 30.0 - 36.0 g/dL RDW 47.8 29.5 - 62.1 %    MPV 8.2 7.0 - 10.0 fL    Platelet 136 (L) 150 - 450 10*9/L   Sodium   Result Value Ref Range    Sodium 124 (L) 135 - 145 mmol/L   Sodium   Result Value Ref Range    Sodium 124 (L) 135 - 145 mmol/L   Sodium   Result Value Ref Range    Sodium 126 (L) 135 - 145 mmol/L   Sodium   Result Value Ref Range    Sodium 128 (L) 135 - 145  mmol/L   Sodium   Result Value Ref Range    Sodium 128 (L) 135 - 145 mmol/L   Sodium   Result Value Ref Range    Sodium 127 (L) 135 - 145 mmol/L   Sodium   Result Value Ref Range    Sodium 127 (L) 135 - 145 mmol/L   Sodium   Result Value Ref Range    Sodium 130 (L) 135 - 145 mmol/L   CBC   Result Value Ref Range    WBC 5.8 3.5 - 10.5 10*9/L    RBC 3.20 (L) 4.32 - 5.72 10*12/L    HGB 10.8 (L) 13.5 - 17.5 g/dL    HCT 47.8 (L) 29.5 - 50.0 %    MCV 96.8 (H) 81.0 - 95.0 fL    MCH 33.6 26.0 - 34.0 pg    MCHC 34.7 30.0 - 36.0 g/dL    RDW 62.1 30.8 - 65.7 %    MPV 8.0 7.0 - 10.0 fL    Platelet 135 (L) 150 - 450 10*9/L   Comprehensive metabolic panel   Result Value Ref Range    Sodium 127 (L) 135 - 145 mmol/L    Potassium 3.9 3.4 - 4.5 mmol/L    Chloride 98 98 - 107 mmol/L    Anion Gap 4 (L) 5 - 14 mmol/L    CO2 25.3 20.0 - 31.0 mmol/L    BUN 10 9 - 23 mg/dL    Creatinine 8.46 9.62 - 1.10 mg/dL    BUN/Creatinine Ratio 17     EGFR CKD-EPI Non-African American, Male >90 >=60 mL/min/1.64m2    EGFR CKD-EPI African American, Male >90 >=60 mL/min/1.74m2    Glucose 99 70 - 179 mg/dL    Calcium 8.6 (L) 8.7 - 10.4 mg/dL    Albumin 2.9 (L) 3.4 - 5.0 g/dL    Total Protein 6.3 5.7 - 8.2 g/dL    Total Bilirubin 0.2 (L) 0.3 - 1.2 mg/dL    AST 30 <=95 U/L    ALT 29 10 - 49 U/L    Alkaline Phosphatase 102 46 - 116 U/L   Magnesium Level   Result Value Ref Range    Magnesium 1.4 (L) 1.6 - 2.6 mg/dL   CBC   Result Value Ref Range    WBC 7.1 3.5 - 10.5 10*9/L    RBC 3.13 (L) 4.32 - 5.72 10*12/L    HGB 10.8 (L) 13.5 - 17.5 g/dL    HCT 28.4 (L) 13.2 - 50.0 %    MCV 97.5 (H) 81.0 - 95.0 fL    MCH 34.5 (H) 26.0 - 34.0 pg    MCHC 35.4 30.0 - 36.0 g/dL    RDW 44.0 10.2 - 72.5 %    MPV 8.2 7.0 - 10.0 fL    Platelet 136 (L) 150 - 450 10*9/L   Comprehensive metabolic panel   Result Value Ref Range    Sodium 127 (L) 135 - 145 mmol/L    Potassium 4.5 3.4 - 4.5 mmol/L    Chloride 95 (L) 98 - 107 mmol/L    Anion Gap 7 5 - 14 mmol/L    CO2 25.1 20.0 - 31.0 mmol/L    BUN 8 (L) 9 - 23 mg/dL    Creatinine 3.66 4.40 - 1.10 mg/dL    BUN/Creatinine Ratio 12     EGFR CKD-EPI Non-African American, Male >90 >=60 mL/min/1.35m2    EGFR CKD-EPI African American, Male >90 >=60 mL/min/1.64m2    Glucose 107  70 - 179 mg/dL    Calcium 9.1 8.7 - 16.1 mg/dL    Albumin 3.0 (L) 3.4 - 5.0 g/dL    Total Protein 6.6 5.7 - 8.2 g/dL    Total Bilirubin 0.3 0.3 - 1.2 mg/dL    AST 21 <=09 U/L    ALT 26 10 - 49 U/L    Alkaline Phosphatase 72 46 - 116 U/L   Magnesium Level   Result Value Ref Range    Magnesium 1.7 1.6 - 2.6 mg/dL   CBC   Result Value Ref Range    WBC 6.0 3.5 - 10.5 10*9/L    RBC 3.47 (L) 4.32 - 5.72 10*12/L    HGB 11.5 (L) 13.5 - 17.5 g/dL    HCT 60.4 (L) 54.0 - 50.0 %    MCV 95.9 (H) 81.0 - 95.0 fL    MCH 33.2 26.0 - 34.0 pg    MCHC 34.6 30.0 - 36.0 g/dL    RDW 98.1 19.1 - 47.8 %    MPV 7.9 7.0 - 10.0 fL    Platelet 162 150 - 450 10*9/L   Comprehensive metabolic panel   Result Value Ref Range    Sodium 126 (L) 135 - 145 mmol/L    Potassium 4.4 3.4 - 4.5 mmol/L    Chloride 93 (L) 98 - 107 mmol/L    Anion Gap 6 5 - 14 mmol/L    CO2 26.8 20.0 - 31.0 mmol/L    BUN 11 9 - 23 mg/dL    Creatinine 2.95 6.21 - 1.10 mg/dL    BUN/Creatinine Ratio 17     EGFR CKD-EPI Non-African American, Male >90 >=60 mL/min/1.40m2    EGFR CKD-EPI African American, Male >90 >=60 mL/min/1.72m2    Glucose 111 70 - 179 mg/dL    Calcium 9.6 8.7 - 30.8 mg/dL    Albumin 3.2 (L) 3.4 - 5.0 g/dL    Total Protein 7.2 5.7 - 8.2 g/dL    Total Bilirubin 0.5 0.3 - 1.2 mg/dL    AST 19 <=65 U/L    ALT 23 10 - 49 U/L    Alkaline Phosphatase 72 46 - 116 U/L   Magnesium Level Result Value Ref Range    Magnesium 1.8 1.6 - 2.6 mg/dL   Magnesium Level   Result Value Ref Range    Magnesium 1.6 1.6 - 2.6 mg/dL   Basic Metabolic Panel   Result Value Ref Range    Sodium 125 (L) 135 - 145 mmol/L    Potassium 4.7 (H) 3.4 - 4.5 mmol/L    Chloride 94 (L) 98 - 107 mmol/L    CO2 25.9 20.0 - 31.0 mmol/L    Anion Gap 5 5 - 14 mmol/L    BUN 14 9 - 23 mg/dL    Creatinine 7.84 6.96 - 1.10 mg/dL    BUN/Creatinine Ratio 21     EGFR CKD-EPI Non-African American, Male >90 >=60 mL/min/1.54m2    EGFR CKD-EPI African American, Male >90 >=60 mL/min/1.18m2    Glucose 104 70 - 179 mg/dL    Calcium 9.3 8.7 - 29.5 mg/dL   Magnesium Level   Result Value Ref Range    Magnesium 1.6 1.6 - 2.6 mg/dL   Basic Metabolic Panel   Result Value Ref Range    Sodium 126 (L) 135 - 145 mmol/L    Potassium 4.1 3.4 - 4.5 mmol/L    Chloride 94 (L) 98 - 107 mmol/L    CO2 25.1 20.0 - 31.0 mmol/L  Anion Gap 7 5 - 14 mmol/L    BUN 16 9 - 23 mg/dL    Creatinine 5.78 4.69 - 1.10 mg/dL    BUN/Creatinine Ratio 24     EGFR CKD-EPI Non-African American, Male >90 >=60 mL/min/1.37m2    EGFR CKD-EPI African American, Male >90 >=60 mL/min/1.63m2    Glucose 104 70 - 179 mg/dL    Calcium 9.3 8.7 - 62.9 mg/dL   Magnesium Level   Result Value Ref Range    Magnesium 1.7 1.6 - 2.6 mg/dL   Basic Metabolic Panel   Result Value Ref Range    Sodium 125 (L) 135 - 145 mmol/L    Potassium 4.7 (H) 3.4 - 4.5 mmol/L    Chloride 93 (L) 98 - 107 mmol/L    CO2 27.3 20.0 - 31.0 mmol/L    Anion Gap 5 5 - 14 mmol/L    BUN 15 9 - 23 mg/dL    Creatinine 5.28 4.13 - 1.10 mg/dL    BUN/Creatinine Ratio 24     EGFR CKD-EPI Non-African American, Male >90 >=60 mL/min/1.4m2    EGFR CKD-EPI African American, Male >90 >=60 mL/min/1.50m2    Glucose 103 70 - 179 mg/dL    Calcium 9.6 8.7 - 24.4 mg/dL   Sodium, Random Urine   Result Value Ref Range    Sodium, Ur 110 mmol/L   Creatinine, Urine   Result Value Ref Range    Creat U 103.0 Undefined mg/dL   Osmolality, Random Urine Result Value Ref Range    Osmolality, Ur 649 mOsm/kg   Magnesium Level   Result Value Ref Range    Magnesium 1.7 1.6 - 2.6 mg/dL   Basic Metabolic Panel   Result Value Ref Range    Sodium 126 (L) 135 - 145 mmol/L    Potassium 4.5 3.4 - 4.5 mmol/L    Chloride 94 (L) 98 - 107 mmol/L    CO2 26.2 20.0 - 31.0 mmol/L    Anion Gap 6 5 - 14 mmol/L    BUN 16 9 - 23 mg/dL    Creatinine 0.10 2.72 - 1.10 mg/dL    BUN/Creatinine Ratio 22     EGFR CKD-EPI Non-African American, Male >90 >=60 mL/min/1.91m2    EGFR CKD-EPI African American, Male >90 >=60 mL/min/1.29m2    Glucose 103 70 - 179 mg/dL    Calcium 9.3 8.7 - 53.6 mg/dL   Sodium   Result Value Ref Range    Sodium 126 (L) 135 - 145 mmol/L   Valproic Acid Level   Result Value Ref Range    Valproic Acid, Total 33.3 (L) 50.0 - 100.0 ug/mL   Uric acid   Result Value Ref Range    Uric Acid 4.5 3.7 - 9.2 mg/dL   Sodium   Result Value Ref Range    Sodium 131 (L) 135 - 145 mmol/L   Sodium   Result Value Ref Range    Sodium 131 (L) 135 - 145 mmol/L   Valproic Acid Level   Result Value Ref Range    Valproic Acid, Total 84.1 50.0 - 100.0 ug/mL   Sodium   Result Value Ref Range    Sodium 131 (L) 135 - 145 mmol/L   Sodium   Result Value Ref Range    Sodium 133 (L) 135 - 145 mmol/L   POCT Glucose   Result Value Ref Range    Glucose, POC 97 70 - 179 mg/dL   CBC w/ Differential   Result Value  Ref Range    WBC 5.6 4.5 - 11.0 10*9/L    RBC 3.41 (L) 4.50 - 5.90 10*12/L    HGB 11.5 (L) 13.5 - 17.5 g/dL    HCT 16.1 (L) 09.6 - 53.0 %    MCV 97.7 80.0 - 100.0 fL    MCH 33.7 26.0 - 34.0 pg    MCHC 34.5 31.0 - 37.0 g/dL    RDW 04.5 40.9 - 81.1 %    MPV 8.8 7.0 - 10.0 fL    Platelet 140 (L) 150 - 440 10*9/L    Neutrophils % 74.6 %    Lymphocytes % 12.6 %    Monocytes % 10.0 %    Eosinophils % 0.8 %    Basophils % 0.2 %    Absolute Neutrophils 4.2 2.0 - 7.5 10*9/L    Absolute Lymphocytes 0.7 (L) 1.5 - 5.0 10*9/L    Absolute Monocytes 0.6 0.2 - 0.8 10*9/L    Absolute Eosinophils 0.1 0.0 - 0.4 10*9/L Absolute Basophils 0.0 0.0 - 0.1 10*9/L    Large Unstained Cells 2 0 - 4 %    Macrocytosis Slight (A) Not Present   CBC w/ Differential   Result Value Ref Range    WBC 5.7 3.5 - 10.5 10*9/L    RBC 3.50 (L) 4.32 - 5.72 10*12/L    HGB 11.9 (L) 13.5 - 17.5 g/dL    HCT 91.4 (L) 78.2 - 50.0 %    MCV 97.9 (H) 81.0 - 95.0 fL    MCH 34.1 (H) 26.0 - 34.0 pg    MCHC 34.8 30.0 - 36.0 g/dL    RDW 95.6 21.3 - 08.6 %    MPV 7.6 7.0 - 10.0 fL    Platelet 150 150 - 450 10*9/L    nRBC 0 <=4 /100 WBCs    Neutrophils % 44.6 %    Lymphocytes % 39.0 %    Monocytes % 15.1 %    Eosinophils % 0.5 %    Basophils % 0.8 %    Absolute Neutrophils 2.5 1.7 - 7.7 10*9/L    Absolute Lymphocytes 2.2 0.7 - 4.0 10*9/L    Absolute Monocytes 0.9 0.1 - 1.0 10*9/L    Absolute Eosinophils 0.0 0.0 - 0.7 10*9/L    Absolute Basophils 0.0 0.0 - 0.1 10*9/L     Imaging: None    Psychometrics:  To be completed per unit protocol    SAFE-T:  Not completed, as patient scored low or moderate on CSSRS
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
## 2020-03-09 NOTE — Unmapped (Signed)
Patient is alert and oriented, spent the day in his room. Patient is continent of bowel and bladder, uses walker to walk to the bathroom and back to bed, uses urinal for urine at the bedside, no BM thus far today.  Patient is compliant with prescribed medication and independent with ADL this shift, no falls or injuries.  Patient denies SI/HI/AVH.      Problem: Adult Behavioral Health Plan of Care  Goal: Plan of Care Review  Outcome: Progressing  Flowsheets (Taken 03/09/2020 1424)  Progress: improving  Plan of Care Reviewed With: patient  Patient Agreement with Plan of Care: agrees  Goal: Patient-Specific Goal (Individualization)  Outcome: Progressing  Flowsheets (Taken 03/09/2020 1424)  Patient-Specific Goals (Include Timeframe): 'To get better and go home', 03/18/20  Patient Personal Strengths:  ??? expressive of emotions  ??? expressive of needs  Goal: Adheres to Safety Considerations for Self and Others  Outcome: Progressing  Goal: Absence of New-Onset Illness or Injury  Outcome: Progressing  Note: No new onset illness or injury  Goal: Optimized Coping Skills in Response to Life Stressors  Outcome: Progressing  Goal: Develops/Participates in Therapeutic Alliance to Support Successful Transition  Outcome: Progressing  Goal: Rounds/Family Conference  Outcome: Progressing  Flowsheets (Taken 03/09/2020 1424)  Participants:  ??? psychiatrist  ??? patient  ??? nursing  ??? case manager  ??? social work     Problem: Self-Care Deficit  Goal: Improved Ability to Complete Activities of Daily Living  Outcome: Progressing     Problem: Fall Injury Risk  Goal: Absence of Fall and Fall-Related Injury  Outcome: Progressing  Note: No falls or injuries noted     Problem: Pain Chronic (Persistent) (Comorbidity Management)  Goal: Acceptable Pain Control and Functional Ability  Outcome: Progressing     Problem: Seizure Disorder Comorbidity  Goal: Maintenance of Seizure Control  Outcome: Progressing

## 2020-03-09 NOTE — Unmapped (Signed)
Pt is a discharge/readmit to psych this evening.  Pt given discharge instructions and verbalized understanding.  Pt is awaiting open bed on psych unit, which should be later this evening per house supervisor.  This RN spoke to officer Manring regarding this, who stated that an officer will likely come from main to pick up patient when he is ready for transportation.  PLC to let house supervisor know when bed is ready.    Problem: Adult Inpatient Plan of Care  Goal: Plan of Care Review  Outcome: Transitioned to Another Facility  Goal: Patient-Specific Goal (Individualized)  Outcome: Transitioned to Another Facility  Goal: Absence of Hospital-Acquired Illness or Injury  Outcome: Transitioned to Another Facility  Intervention: Prevent and Manage VTE (Venous Thromboembolism) Risk  Recent Flowsheet Documentation  Taken 03/08/2020 1000 by Francena Hanly, RN  VTE Prevention/Management: anticoagulant therapy  Taken 03/08/2020 0800 by Francena Hanly, RN  Activity Management: activity adjusted per tolerance  Goal: Optimal Comfort and Wellbeing  Outcome: Transitioned to Another Facility  Goal: Readiness for Transition of Care  Outcome: Transitioned to Another Facility  Goal: Rounds/Family Conference  Outcome: Transitioned to Another Facility     Problem: Self-Care Deficit  Goal: Improved Ability to Complete Activities of Daily Living  Outcome: Transitioned to Another Facility     Problem: Behavioral Health Comorbidity  Goal: Maintenance of Behavioral Health Symptom Control  Outcome: Transitioned to Another Facility     Problem: Hypertension Comorbidity  Goal: Blood Pressure in Desired Range  Outcome: Transitioned to Another Facility     Problem: Osteoarthritis Comorbidity  Goal: Maintenance of Osteoarthritis Symptom Control  Outcome: Transitioned to Another Facility  Intervention: Maintain Osteoarthritis Symptom Control  Recent Flowsheet Documentation  Taken 03/08/2020 0800 by Francena Hanly, RN  Activity Management: activity adjusted per tolerance     Problem: Pain Chronic (Persistent) (Comorbidity Management)  Goal: Acceptable Pain Control and Functional Ability  Outcome: Transitioned to Another Facility     Problem: Fall Injury Risk  Goal: Absence of Fall and Fall-Related Injury  Outcome: Transitioned to Another Facility     Problem: Behavioral Health Comorbidity  Goal: Maintenance of Behavioral Health Symptom Control  Outcome: Transitioned to Another Facility     Problem: Hypertension Comorbidity  Goal: Blood Pressure in Desired Range  Outcome: Transitioned to Another Facility     Problem: Seizure, Active Management  Goal: Absence of Seizure/Seizure-Related Injury  Outcome: Transitioned to Another Facility  Intervention: Prevent Seizure-Related Injury  Recent Flowsheet Documentation  Taken 03/08/2020 0800 by Francena Hanly, RN  Seizure Precautions: side rails padded     Problem: Gas Exchange Impaired  Goal: Optimal Gas Exchange  Outcome: Transitioned to Another Facility  Intervention: Optimize Oxygenation and Ventilation  Recent Flowsheet Documentation  Taken 03/08/2020 0800 by Francena Hanly, RN  Head of Bed [facility]) Positioning: HOB at 30 degrees     Problem: Skin Injury Risk Increased  Goal: Skin Health and Integrity  Outcome: Transitioned to Another Facility  Intervention: Optimize Skin Protection  Recent Flowsheet Documentation  Taken 03/08/2020 0800 by Francena Hanly, RN  Head of Bed [facility]) Positioning: HOB at 30 degrees
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
## 2020-03-10 LAB — COMPREHENSIVE METABOLIC PANEL
ALBUMIN: 3.1 g/dL — ABNORMAL LOW (ref 3.4–5.0)
ALKALINE PHOSPHATASE: 60 U/L (ref 46–116)
ALT (SGPT): 10 U/L (ref 10–49)
ANION GAP: 6 mmol/L (ref 5–14)
AST (SGOT): 15 U/L (ref <=34)
BILIRUBIN TOTAL: 0.4 mg/dL (ref 0.3–1.2)
BLOOD UREA NITROGEN: 14 mg/dL (ref 9–23)
BUN / CREAT RATIO: 21
CALCIUM: 9.3 mg/dL (ref 8.7–10.4)
CHLORIDE: 93 mmol/L — ABNORMAL LOW (ref 98–107)
CO2: 29 mmol/L (ref 20.0–31.0)
CREATININE: 0.67 mg/dL
EGFR CKD-EPI AA MALE: >90 mL/min/{1.73_m2} (ref >=60)
EGFR CKD-EPI NON-AA MALE: >90 mL/min/{1.73_m2} (ref >=60)
GLUCOSE RANDOM: 90 mg/dL (ref 70–179)
POTASSIUM: 4.4 mmol/L (ref 3.4–4.5)
PROTEIN TOTAL: 6.7 g/dL (ref 5.7–8.2)
SODIUM: 128 mmol/L — ABNORMAL LOW (ref 135–145)

## 2020-03-10 MED ADMIN — thiamine mononitrate (vit B1) tablet 100 mg: 100 mg | ORAL | @ 14:00:00

## 2020-03-10 MED ADMIN — polyethylene glycol (MIRALAX) packet 17 g: 17 g | ORAL | @ 14:00:00

## 2020-03-10 MED ADMIN — lisinopriL (PRINIVIL,ZESTRIL) tablet 40 mg: 40 mg | ORAL | @ 14:00:00

## 2020-03-10 MED ADMIN — nicotine (NICODERM CQ) 21 mg/24 hr patch 1 patch: 1 | TRANSDERMAL | @ 14:00:00

## 2020-03-10 MED ADMIN — sodium chloride tablet 1 g: 1 g | ORAL | @ 23:00:00

## 2020-03-10 MED ADMIN — magnesium oxide (MAG-OX) tablet 800 mg: 800 mg | ORAL | @ 14:00:00

## 2020-03-10 MED ADMIN — gabapentin (NEURONTIN) capsule 200 mg: 200 mg | ORAL | @ 19:00:00

## 2020-03-10 MED ADMIN — gabapentin (NEURONTIN) capsule 200 mg: 200 mg | ORAL | @ 14:00:00

## 2020-03-10 MED ADMIN — divalproex (DEPAKOTE) DR tablet 500 mg: 500 mg | ORAL | @ 02:00:00

## 2020-03-10 MED ADMIN — folic acid (FOLVITE) tablet 1 mg: 1 mg | ORAL | @ 14:00:00

## 2020-03-10 MED ADMIN — senna (SENOKOT) tablet 1 tablet: 1 | ORAL | @ 02:00:00

## 2020-03-10 MED ADMIN — naltrexone (DEPADE) tablet 50 mg: 50 mg | ORAL | @ 14:00:00

## 2020-03-10 MED ADMIN — sodium chloride tablet 1 g: 1 g | ORAL | @ 14:00:00

## 2020-03-10 MED ADMIN — polyethylene glycol (MIRALAX) packet 17 g: 17 g | ORAL | @ 02:00:00

## 2020-03-10 MED ADMIN — gabapentin (NEURONTIN) capsule 200 mg: 200 mg | ORAL | @ 02:00:00

## 2020-03-10 MED ADMIN — OLANZapine (ZYPREXA) tablet 15 mg: 15 mg | ORAL | @ 02:00:00

## 2020-03-10 MED ADMIN — sodium chloride tablet 1 g: 1 g | ORAL | @ 18:00:00
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
## 2020-03-10 NOTE — Unmapped (Signed)
Patient seen in his room reading a book. He is calm , cooperative and appropriate. Denies any SI/HI/AVH. Describes mood as bored. He mobilizes using his rollator walker with one person assist. Made a phone call to his wife, took HS meds and went to sleep without any problem.  Falls and seizure precautions in progress. Will continue to monitor.  Problem: Adult Behavioral Health Plan of Care  Goal: Plan of Care Review  Outcome: Progressing  Flowsheets (Taken 03/10/2020 0303)  Progress: improving  Plan of Care Reviewed With: patient  Patient Agreement with Plan of Care: agrees  Goal: Patient-Specific Goal (Individualization)  Outcome: Progressing  Flowsheets (Taken 03/10/2020 0303)  Patient-Specific Goals (Include Timeframe): To get better and go home, by 03/18/2020  Patient Personal Strengths:   expressive of emotions   expressive of needs   medication/treatment adherence  Goal: Adheres to Safety Considerations for Self and Others  Outcome: Progressing  Goal: Absence of New-Onset Illness or Injury  Outcome: Progressing  Intervention: Prevent Infection  Recent Flowsheet Documentation  Taken 03/09/2020 2100 by Ronnell Guadalajara, RN  Infection Prevention:   hand hygiene promoted   rest/sleep promoted  Goal: Optimized Coping Skills in Response to Life Stressors  Outcome: Progressing  Goal: Develops/Participates in Therapeutic Alliance to Support Successful Transition  Outcome: Progressing  Goal: Rounds/Family Conference  Outcome: Progressing     Problem: Self-Care Deficit  Goal: Improved Ability to Complete Activities of Daily Living  Outcome: Progressing     Problem: Fall Injury Risk  Goal: Absence of Fall and Fall-Related Injury  Outcome: Progressing  Intervention: Promote Injury-Free Environment  Recent Flowsheet Documentation  Taken 03/09/2020 2100 by Ronnell Guadalajara, RN  Safety Interventions:   assistive device   bed alarm   environmental modification   fall reduction program maintained   low bed   mobility aid   nonskid shoes/slippers when out of bed   room near unit station     Problem: Seizure Disorder Comorbidity  Goal: Maintenance of Seizure Control  Outcome: Progressing  Intervention: Maintain Seizure-Symptom Control  Recent Flowsheet Documentation  Taken 03/09/2020 2100 by Ronnell Guadalajara, RN  Seizure Precautions:   clutter-free environment maintained   side rails padded

## 2020-03-11 LAB — SODIUM: SODIUM: 131 mmol/L — ABNORMAL LOW (ref 135–145)

## 2020-03-11 MED ADMIN — polyethylene glycol (MIRALAX) packet 17 g: 17 g | ORAL | @ 01:00:00

## 2020-03-11 MED ADMIN — folic acid (FOLVITE) tablet 1 mg: 1 mg | ORAL | @ 14:00:00

## 2020-03-11 MED ADMIN — gabapentin (NEURONTIN) capsule 200 mg: 200 mg | ORAL | @ 01:00:00

## 2020-03-11 MED ADMIN — multivitamins, therapeutic with minerals tablet 1 tablet: 1 | ORAL | @ 17:00:00

## 2020-03-11 MED ADMIN — sodium chloride tablet 1 g: 1 g | ORAL | @ 23:00:00

## 2020-03-11 MED ADMIN — senna (SENOKOT) tablet 1 tablet: 1 | ORAL | @ 01:00:00

## 2020-03-11 MED ADMIN — gabapentin (NEURONTIN) capsule 200 mg: 200 mg | ORAL | @ 18:00:00

## 2020-03-11 MED ADMIN — divalproex (DEPAKOTE) DR tablet 500 mg: 500 mg | ORAL | @ 01:00:00

## 2020-03-11 MED ADMIN — nicotine (NICODERM CQ) 21 mg/24 hr patch 1 patch: 1 | TRANSDERMAL | @ 14:00:00

## 2020-03-11 MED ADMIN — magnesium oxide (MAG-OX) tablet 800 mg: 800 mg | ORAL | @ 14:00:00

## 2020-03-11 MED ADMIN — thiamine mononitrate (vit B1) tablet 100 mg: 100 mg | ORAL | @ 14:00:00

## 2020-03-11 MED ADMIN — gabapentin (NEURONTIN) capsule 200 mg: 200 mg | ORAL | @ 14:00:00

## 2020-03-11 MED ADMIN — naltrexone (DEPADE) tablet 50 mg: 50 mg | ORAL | @ 14:00:00

## 2020-03-11 MED ADMIN — sodium chloride tablet 1 g: 1 g | ORAL | @ 14:00:00

## 2020-03-11 MED ADMIN — sodium chloride tablet 1 g: 1 g | ORAL | @ 17:00:00

## 2020-03-11 MED ADMIN — OLANZapine (ZYPREXA) tablet 15 mg: 15 mg | ORAL | @ 01:00:00

## 2020-03-11 MED FILL — HUMIRA SYRINGE CITRATE FREE 40 MG/0.4 ML: 28 days supply | Qty: 2 | Fill #3

## 2020-03-11 NOTE — Unmapped (Signed)
Mr Nicholas Rush spent the evening in his room, calmly reading a book.  He is pleasant, appropriate, and appreciative of care. He denies SI/HI/AVH just says he's bored here. He mobilizes using his rollator walker with one-person assist.  Pt denies pain and remains free of falls.  Made a phone call to wife, took HS meds and went to sleep without any problem.  Falls and seizure precautions in progress. Will continue POC, Pt monitored and supported as ordered.    Problem: Adult Behavioral Health Plan of Care  Goal: Plan of Care Review  Outcome: Progressing  Goal: Patient-Specific Goal (Individualization)  Outcome: Progressing  Flowsheets (Taken 03/10/2020 2336)  Patient-Specific Goals (Include Timeframe): Pt states that he met his goal of wanting to shower today. 03/10/2020  Goal: Adheres to Safety Considerations for Self and Others  Outcome: Progressing  Goal: Absence of New-Onset Illness or Injury  Outcome: Progressing  Intervention: Prevent Infection  Recent Flowsheet Documentation  Taken 03/10/2020 1930 by Luella Cook, RN  Infection Prevention: equipment surfaces disinfected  Goal: Optimized Coping Skills in Response to Life Stressors  Outcome: Progressing  Goal: Develops/Participates in Therapeutic Alliance to Support Successful Transition  Outcome: Progressing  Goal: Rounds/Family Conference  Outcome: Progressing     Problem: Self-Care Deficit  Goal: Improved Ability to Complete Activities of Daily Living  Outcome: Progressing     Problem: Fall Injury Risk  Goal: Absence of Fall and Fall-Related Injury  Outcome: Progressing  Intervention: Promote Injury-Free Environment  Recent Flowsheet Documentation  Taken 03/10/2020 1930 by Luella Cook, RN  Safety Interventions:   assistive device   low bed   nonskid shoes/slippers when out of bed   room near unit station   mobility aid   fall reduction program maintained   lighting adjusted for tasks/safety     Problem: Pain Chronic (Persistent) (Comorbidity Management)  Goal: Acceptable Pain Control and Functional Ability  Outcome: Progressing     Problem: Seizure Disorder Comorbidity  Goal: Maintenance of Seizure Control  Outcome: Progressing  Intervention: Maintain Seizure-Symptom Control  Recent Flowsheet Documentation  Taken 03/10/2020 1930 by Luella Cook, RN  Seizure Precautions: side rails padded

## 2020-03-11 NOTE — Unmapped (Addendum)
Received pt. in the room, he had padded side rails, own walker and urinal at bedside. He stated that he had a good sleep last night, denies SI. He utilized his own rolling walker. He has impaired gait. Continue to encouraged to join group.    Problem: Adult Behavioral Health Plan of Care  Goal: Plan of Care Review  Outcome: Progressing  Flowsheets (Taken 03/11/2020 1115)  Progress: no change  Plan of Care Reviewed With: patient  Patient Agreement with Plan of Care: agrees  Goal: Patient-Specific Goal (Individualization)  Outcome: Progressing  Flowsheets (Taken 03/11/2020 1115)  Patient-Specific Goals (Include Timeframe): To get better, get my strength back by 03-14-20  Patient Personal Strengths:  ??? expressive of emotions  ??? expressive of needs  ??? medication/treatment adherence  Goal: Adheres to Safety Considerations for Self and Others  Outcome: Progressing  Flowsheets (Taken 03/11/2020 1115)  Adheres to Safety Considerations for Self and Others: making progress toward outcome  Intervention: Develop and Maintain Individualized Safety Plan  Flowsheets (Taken 03/11/2020 1115)  Safety Measures:  ??? safety plan reviewed  ??? safety rounds completed  Goal: Absence of New-Onset Illness or Injury  Outcome: Progressing  Intervention: Identify and Manage Fall Risk  Flowsheets (Taken 03/11/2020 1115)  Safety Measures:  ??? safety plan reviewed  ??? safety rounds completed  Intervention: Prevent Infection  Flowsheets (Taken 03/11/2020 1115)  Infection Prevention: environmental surveillance performed  Goal: Optimized Coping Skills in Response to Life Stressors  Outcome: Progressing  Flowsheets (Taken 03/11/2020 1115)  Optimized Coping Skills in Response to Life Stressors: making progress toward outcome  Intervention: Promote Effective Coping Strategies  Flowsheets (Taken 03/11/2020 1115)  Supportive Measures:  ??? active listening utilized  ??? self-responsibility promoted  ??? verbalization of feelings encouraged  ??? problem-solving facilitated  ??? relaxation techniques promoted  ??? self-care encouraged  ??? self-reflection promoted  ??? positive reinforcement provided  Goal: Develops/Participates in Therapeutic Alliance to Support Successful Transition  Outcome: Progressing  Flowsheets (Taken 03/11/2020 1115)  Develops/Participates in Therapeutic Alliance to Support Successful Transition: making progress toward outcome  Intervention: [employer]  Flowsheets (Taken 03/11/2020 1115)  Trust Relationship/Rapport:  ??? care explained  ??? reassurance provided  ??? thoughts/feelings acknowledged  ??? choices provided  ??? emotional support provided  ??? empathic listening provided  ??? questions encouraged  ??? questions answered  Intervention: Mutually Develop Transition Plan  Flowsheets (Taken 03/11/2020 1115)  Transition Support: crisis management plan verbalized  Current Discharge Risk: psychiatric illness  Goal: Rounds/Family Conference  Outcome: Progressing  Flowsheets (Taken 03/11/2020 1115)  Participants:  ??? Milieu/Psych techs  ??? nursing  ??? patient  ??? psychiatrist  ??? social work     Problem: Self-Care Deficit  Goal: Improved Ability to Complete Activities of Daily Living  Outcome: Progressing  Intervention: Promote Activity and Functional Independence  Flowsheets (Taken 03/11/2020 1115)  Self-Care Promotion: independence encouraged     Problem: Fall Injury Risk  Goal: Absence of Fall and Fall-Related Injury  Outcome: Progressing  Intervention: Identify and Manage Contributors  Flowsheets (Taken 03/11/2020 1115)  Self-Care Promotion: independence encouraged  Intervention: Promote Injury-Free Environment  Flowsheets  Taken 03/11/2020 1115  Safety Interventions:  ??? low bed  ??? nonskid shoes/slippers when out of bed  Taken 03/11/2020 0815  Safety Interventions:  ??? low bed  ??? nonskid shoes/slippers when out of bed     Problem: Pain Chronic (Persistent) (Comorbidity Management)  Goal: Acceptable Pain Control and Functional Ability  Outcome: Progressing  Intervention: Optimize Psychosocial Wellbeing  Flowsheets (  Taken 03/11/2020 1115)  Supportive Measures:  ??? active listening utilized  ??? self-responsibility promoted  ??? verbalization of feelings encouraged  ??? problem-solving facilitated  ??? relaxation techniques promoted  ??? self-care encouraged  ??? self-reflection promoted  ??? positive reinforcement provided     Problem: Seizure Disorder Comorbidity  Goal: Maintenance of Seizure Control  Outcome: Progressing  Intervention: Maintain Seizure-Symptom Control  Flowsheets (Taken 03/11/2020 1115)  Seizure Precautions: side rails padded
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
## 2020-03-11 NOTE — Unmapped (Signed)
PHYSICAL THERAPY  Progress Note (03/11/20 1231)     Patient Name:  Nicholas Rush       Medical Record Number: [id number]   Date of Birth: September 27, 1965  Sex: Male            Treatment Diagnosis: mobility deficts in setting of history of bipolar 1 disorder, chronic hyponatremia, szs, and alcohol use disorder    Activity Tolerance: Tolerated treatment well    ASSESSMENT  Problem List: Decreased endurance,Impaired balance,Decreased mobility,Fall Risk,Impaired ADLs,Gait deviation (use rollator exclusively for safety)     Assessment : 55 y.o. male with a history of bipolar 1 disorder, chronic hyponatremia, szs, and alcohol use disorder admitted for further evaluation of bipolar 1 disorder and medication management. Patient presents in bed and demonstrated the ability to transfer with supervision and ambulate in hall with rollator with modified indep, limited primarily by endurance and LE strength deficits.  Patient demonstrated improved gait with VC to keep body close to rollator.  Pt indep with provided HEP.  Anticipate mobility progression back to full mobility baseline with continued acute and follow up PT.  Please see Daily Intervention and Today's Intervention sections for additional session detail.  Patient will benefit from continued Acute Physical Therapy per below POC to maximize functional independence prior to D/C.  After a review of the personal factors, comorbidities, clinical presentation, and examination of the number of affected body systems, the patient presents as a moderate complexity case.     Today's Interventions: AMPAC: 23/24.  Mobility assessment completed.  Therex/Theract: standing wt shifts, seated reciprocal LAQs instructed and performed (added to HEP).  Pt Education: POC, anticipated course of cont recovery, benefits of frequent ambulation with rollator and HEP compliance to build endurance and LE strength.  All questions answered.  Patient verbalized understanding and stated intention to comply with recommendations.                          PLAN  Planned Frequency of Treatment:  1x per day for: 1-2x week      Planned Interventions: Balance activities,Education - Patient,Functional mobility,Gait training,Postural re-education,Therapeutic exercise,Therapeutic activity,Transfer training    Post-Discharge Physical Therapy Recommendations:  3x weekly,Community Ambulator    PT DME Recommendations: None (pt has all needed DME including rollator)           Goals:   Patient and Family Goals: to get stronger and walk better    Long Term Goal #1: pt will score 24/24 on AMPAC and return to full PLOF in 4 weeks.       SHORT GOAL #1: pt will perform all transfers indep with LRAD              Time Frame : 1 week  SHORT GOAL #2: pt will indep perform and report compliance with current HEP                                                                         Prognosis:  Good  Positive Indicators: strong insight into deficits, very motivated       SUBJECTIVE  Equipment / Environment: Caregiver wearing mask for full session,Patient not wearing mask for full session  Patient reports:  agreeable to session  Current Functional Status: presents and left in room     Prior Functional Status: Independent home ambulation with rollator  Equipment available at home: Rolling Walker,Wheelchair-manual,Shower chair with back,Rollator     Past Medical History:   Diagnosis Date   ??? Anxiety    ??? Bipolar 1 disorder (CMS-HCC)    ??? Depressive disorder    ??? Erectile dysfunction    ??? Esophageal reflux    ??? History of pyloric stenosis    ??? Hypertension    ??? Insomnia    ??? Psoriasis     Dermatologist - Dr. Deloria Lair in [facility]   ??? Seizure (CMS-HCC) 02/20/2019   ??? Tobacco use     1ppd    Social History     Tobacco Use   ??? Smoking status: Current Every Day Smoker     Packs/day: 1.00     Years: 30.00     Pack years: 30.00     Types: Cigarettes   ??? Smokeless tobacco: Never Used   Substance Use Topics   ??? Alcohol use: Yes     Comment: 3 drinks a day.      Past Surgical History:   Procedure Laterality Date   ??? PYLOROMYOTOMY     ??? WISDOM TOOTH EXTRACTION      Family History   Problem Relation Age of Onset   ??? Bipolar disorder Mother    ??? Depression Sister    ??? Alcohol abuse Sister    ??? Alcohol abuse Maternal Aunt    ??? Melanoma Neg Hx    ??? Basal cell carcinoma Neg Hx    ??? Squamous cell carcinoma Neg Hx         Allergies: Tramadol and Hydroxyzine                Objective Findings  Precautions / Restrictions  Precautions: Seizure precautions,Falls precautions,Psych Safety precautions  Weight Bearing Status: Non-applicable  Required Braces or Orthoses: Non-applicable    Communication Preference: Verbal   Pain Comments: denies at rest     Equipment / Environment: Agricultural consultant (rollator)          Orthostatics: Asymptomatic       Living Situation  Living Environment: House  Lives With: Significant other,Extended Family  Home Living: One level home,Ramped entrance,Tub/shower unit,Shower chair with back,Hand-held shower hose,Standard height toilet,Raised toilet seat with rails     Cognition  Cognition comment: pt pleasant and cooperative throughout. answered all questions appropriately.  Visual / Perception status  Visual/Perception:  (denies acute change/deficit)    UE ROM / Strength  UE ROM/Strength: Left WFL,Right WFL  LE ROM / Strength  LE ROM/Strength: Right WFL,Left WFL  LE comment: ~4-/5 bilat    Motor/ Sensory/ Neuro  Balance comment: static sit EOB: indep, static stand: modified indep with rw (cg without device)     Bed Mobility: supine to/from sit: indep    Transfers  Transfers:  (sit to/from stand: from low bed surface: supervision with RW)     Gait  Level of Assistance: Modified independent, requires aide device or extra time  Assistive Device: Front wheel walker (rollator)  Distance Ambulated (ft): 80 ft (approx)  Gait: VC to keep body close to RW,  subjective report of feeling a little wobbly however without observed LOB    Stairs: NT Physical Therapy Session Duration  PT Individual [mins]: 25         I attest that I have reviewed the above information.  Signed:  Henrene Pastor, PT  Ceasar Mons 03/11/2020

## 2020-03-11 NOTE — Unmapped (Signed)
Patient has been calm and cooperative, spending more time in his room reading. Patient voiced his motivation for treatment and recovery, with a  goal to obtain a shower today. Assured patient that staff will help him with achieving his goal.Continue to offer support and encouragement.  Problem: Adult Behavioral Health Plan of Care  Goal: Plan of Care Review  Outcome: Progressing  Flowsheets (Taken 03/10/2020 1708)  Progress: no change  Plan of Care Reviewed With: patient  Patient Agreement with Plan of Care: agrees  Goal: Patient-Specific Goal (Individualization)  Outcome: Progressing  Flowsheets (Taken 03/10/2020 1708)  Patient-Specific Goals (Include Timeframe):  To take a shower today. 03/11/19.  Patient Personal Strengths:   expressive of emotions   medication/treatment adherence   insight into illness/situation   motivated for treatment   motivated for recovery  Goal: Adheres to Safety Considerations for Self and Others  Outcome: Progressing  Goal: Absence of New-Onset Illness or Injury  Outcome: Progressing  Intervention: Prevent Infection  Recent Flowsheet Documentation  Taken 03/10/2020 1400 by Moss Mc, RN  Infection Prevention:   equipment surfaces disinfected   hand hygiene promoted   personal protective equipment utilized   rest/sleep promoted   environmental surveillance performed   single patient room provided   visitors restricted/screened  Goal: Optimized Coping Skills in Response to Life Stressors  Outcome: Progressing  Goal: Develops/Participates in Therapeutic Alliance to Support Successful Transition  Outcome: Progressing  Goal: Rounds/Family Conference  Outcome: Progressing     Problem: Self-Care Deficit  Goal: Improved Ability to Complete Activities of Daily Living  Outcome: Progressing     Problem: Fall Injury Risk  Goal: Absence of Fall and Fall-Related Injury  Outcome: Progressing  Intervention: Promote Scientist, clinical (histocompatibility and immunogenetics) Documentation  Taken 03/10/2020 1400 by Moss Mc, RN  Safety Interventions: assistive device     Problem: Pain Chronic (Persistent) (Comorbidity Management)  Goal: Acceptable Pain Control and Functional Ability  Outcome: Progressing     Problem: Seizure Disorder Comorbidity  Goal: Maintenance of Seizure Control  Outcome: Progressing  Intervention: Maintain Seizure-Symptom Control  Recent Flowsheet Documentation  Taken 03/10/2020 1400 by Moss Mc, RN  Seizure Precautions: clutter-free environment maintained
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
## 2020-03-12 LAB — SODIUM: SODIUM: 130 mmol/L — ABNORMAL LOW (ref 135–145)

## 2020-03-12 MED ADMIN — senna (SENOKOT) tablet 1 tablet: 1 | ORAL | @ 01:00:00

## 2020-03-12 MED ADMIN — gabapentin (NEURONTIN) capsule 200 mg: 200 mg | ORAL | @ 01:00:00

## 2020-03-12 MED ADMIN — sodium chloride tablet 1 g: 1 g | ORAL | @ 14:00:00

## 2020-03-12 MED ADMIN — lisinopriL (PRINIVIL,ZESTRIL) tablet 40 mg: 40 mg | ORAL | @ 14:00:00

## 2020-03-12 MED ADMIN — thiamine mononitrate (vit B1) tablet 100 mg: 100 mg | ORAL | @ 14:00:00

## 2020-03-12 MED ADMIN — naltrexone (DEPADE) tablet 50 mg: 50 mg | ORAL | @ 14:00:00

## 2020-03-12 MED ADMIN — lithium tablet 300 mg: 300 mg | ORAL | @ 23:00:00

## 2020-03-12 MED ADMIN — multivitamins, therapeutic with minerals tablet 1 tablet: 1 | ORAL | @ 14:00:00

## 2020-03-12 MED ADMIN — gabapentin (NEURONTIN) capsule 200 mg: 200 mg | ORAL | @ 14:00:00

## 2020-03-12 MED ADMIN — OLANZapine (ZYPREXA) tablet 15 mg: 15 mg | ORAL | @ 01:00:00

## 2020-03-12 MED ADMIN — magnesium oxide (MAG-OX) tablet 800 mg: 800 mg | ORAL | @ 14:00:00

## 2020-03-12 MED ADMIN — folic acid (FOLVITE) tablet 1 mg: 1 mg | ORAL | @ 14:00:00

## 2020-03-12 MED ADMIN — divalproex (DEPAKOTE) DR tablet 250 mg: 250 mg | ORAL | @ 01:00:00

## 2020-03-12 MED ADMIN — sodium chloride tablet 1 g: 1 g | ORAL | @ 17:00:00

## 2020-03-12 MED ADMIN — nicotine (NICODERM CQ) 21 mg/24 hr patch 1 patch: 1 | TRANSDERMAL | @ 14:00:00

## 2020-03-12 MED ADMIN — gabapentin (NEURONTIN) capsule 200 mg: 200 mg | ORAL | @ 20:00:00

## 2020-03-12 MED ADMIN — sodium chloride tablet 1 g: 1 g | ORAL | @ 23:00:00
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
## 2020-03-12 NOTE — Unmapped (Signed)
Social Work  Psychosocial Assessment    Patient Name: Nicholas Rush   Medical Record Number: [id number]   Date of Birth: 1965-03-24  Sex: Male     Referral  Referred by: Physician  Reason for Referral: Cognitive / Behavioral / Psych Concerns     Nicholas Rush is a 55 year old male who presents to Psychiatry from [facility] for evaluation of bipolar disorder and medication management. Nicholas Rush has a reported history of bipolar 1 disorder and has had 3 psychiatric hospitalizations for mania since November 2021. [name] previously was a heavy alcohol drinker, but reports being sober since September 2021. He is currently followed by the [facility].     Extended Emergency Contact Information  Primary Emergency Contact: Bisbee,[name]  Address: [address] [place], [place] [address] [place]  Mobile Phone: [phone]  Relation: Spouse  Preferred language: ENGLISH  Interpreter needed? No    Legal Next of Kin / Guardian / POA / Advance Directives      Advance Directive (Medical Treatment)  Does patient have an advance directive covering medical treatment?: Patient does not have advance directive covering medical treatment.  Reason patient does not have an advance directive covering medical treatment:: Patient does not wish to complete one at this time.    Health Care Decision Maker [HCDM] (Medical & Mental Health Treatment)  Healthcare Decision Maker: Patient does not wish to appoint a Health Care Decision Maker at this time  Information offered on HCDM, Medical & Mental Health advance directives:: Patient declined information.    Advance Directive (Mental Health Treatment)  Does patient have an advance directive covering mental health treatment?: Patient does not have advance directive covering mental health treatment.  Reason patient does not have an advance directive covering mental health treatment:: Patient does not wish to complete one at this time.    Discharge Planning  Discharge Planning Information:   Type of Residence   Mailing Address:  [address]  [place] [address]    Medical Information   Past Medical History:   Diagnosis Date   ??? Anxiety    ??? Bipolar 1 disorder (CMS-HCC)    ??? Depressive disorder    ??? Erectile dysfunction    ??? Esophageal reflux    ??? History of pyloric stenosis    ??? Hypertension    ??? Insomnia    ??? Psoriasis     Dermatologist - Dr. Deloria Lair in [facility]   ??? Seizure (CMS-HCC) 02/20/2019   ??? Tobacco use     1ppd       Past Surgical History:   Procedure Laterality Date   ??? PYLOROMYOTOMY     ??? WISDOM TOOTH EXTRACTION         Family History   Problem Relation Age of Onset   ??? Bipolar disorder Mother    ??? Depression Sister    ??? Alcohol abuse Sister    ??? Alcohol abuse Maternal Aunt    ??? Melanoma Neg Hx    ??? Basal cell carcinoma Neg Hx    ??? Squamous cell carcinoma Neg Hx        Financial Information   Primary Insurance: Payor: MEDICAID [employer] / Plan: MEDICAID [employer] / Product Type: *No Product type* /    Secondary Insurance: None   Prescription Coverage: Medicaid   Preferred Pharmacy: [facility]  [facility]  [id number] - [place], [place] - [address] AT [facility] [address] [facility] [address] & [facility]  [facility]  [facility] [phone] - [address] [place], [place] - [address] AT [facility] OF [address]    Barriers to taking medication: TBD    Transition Home   Transportation at time of discharge: Family/Friend's Sales executive or Taxi    Anticipated changes related to Illness: none   Services in place prior to admission: Home Medical Equipment: rollator and Scientist, research (physical sciences) Mental Health Services: [facility]   Services anticipated for DC: tbd   Hemodialysis Prior to Admission: No    Readmission  Risk of Unplanned Readmission Score: UNPLANNED READMISSION SCORE: 23%  Readmitted Within the Last 30 Days? Yes  Readmission Factors include: other: N/A    Social Determinants of Health  Social Determinants of Health     Tobacco Use: High Risk   ??? Smoking Tobacco Use: Current Every Day Smoker   ??? Smokeless Tobacco Use: Never Used   Alcohol Use: Not At Risk   ??? How often do you have a drink containing alcohol?: 2 - 4 times per month   ??? How many drinks containing alcohol do you have on a typical day when you are drinking?: 3 - 4   ??? How often do you have 5 or more drinks on one occasion?: Monthly   Financial Resource Strain: Low Risk    ??? Difficulty of Paying Living Expenses: Not hard at all   Food Insecurity: No Food Insecurity   ??? Worried About Programme researcher, broadcasting/film/video in the Last Year: Never true   ??? Ran Out of Food in the Last Year: Never true   Transportation Needs: No Transportation Needs   ??? Lack of Transportation (Medical): No   ??? Lack of Transportation (Non-Medical): No   Physical Activity: Not on file   Stress: Not on file   Social Connections: Not on file   Intimate Partner Violence: Not on file   Depression: Not on file   Housing/Utilities: Low Risk    ??? Within the past 12 months, have you ever stayed: outside, in a car, in a tent, in an overnight shelter, or temporarily in someone else's home (i.e. couch-surfing)?: No   ??? Are you worried about losing your housing?: No   ??? Within the past 12 months, have you been unable to get utilities (heat, electricity) when it was really needed?: No   Substance Use: Not on file   Health Literacy: Not on file       Social History  Support Systems/Concerns: Armed forces technical officer Service: No Field seismologist Affecting Healthcare: None reported    Medical and Psychiatric History  Psychosocial Stressors: Coping with health challenges/recent hospitalization      Psychological Issues/Information: Mental illness   Concerns: Active mental illness concerns,Inpatient treatment for mental illness,Outpatient treatment for mental illness,Patient history of mental illness          Chemical Dependency: Alcohol (sober from alcohol since September 2021)              Outpatient Providers: Psychiatrist   Name / Contact #: : Dr. Roxanne Gates, [facility]  Legal: No legal  issues      Ability to [employer]: No issues accessing community services        [place], [place] pager [phone]

## 2020-03-12 NOTE — Unmapped (Signed)
Patient in bed reading a book at beginning of shift. He remains calm, cooperative, pleasant and care compliant. He walked to the bathroom and to the dayroom utilizing his rollator walker. No falls this shift. He denies SI/HI/AVH. He took bedtime medication, made a phone call to his wife and went back to bed without any problem. Bed alarm on, side rails padded/ up and bed on low position. Will continue plan of care.  Problem: Adult Behavioral Health Plan of Care  Goal: Plan of Care Review  Outcome: Progressing  Flowsheets (Taken 03/12/2020 0516)  Progress: improving  Plan of Care Reviewed With: patient  Patient Agreement with Plan of Care: agrees  Goal: Patient-Specific Goal (Individualization)  Outcome: Progressing  Flowsheets (Taken 03/12/2020 0516)  Patient-Specific Goals (Include Timeframe): To get up and keep walking, 03/12/2020  Patient Personal Strengths:   expressive of emotions   expressive of needs   family/social support   medication/treatment adherence  Goal: Adheres to Safety Considerations for Self and Others  Outcome: Progressing  Goal: Absence of New-Onset Illness or Injury  Outcome: Progressing  Intervention: Prevent Infection  Recent Flowsheet Documentation  Taken 03/12/2020 0000 by Ronnell Guadalajara, RN  Infection Prevention:   hand hygiene promoted   rest/sleep promoted  Goal: Optimized Coping Skills in Response to Life Stressors  Outcome: Progressing  Goal: Develops/Participates in Therapeutic Alliance to Support Successful Transition  Outcome: Progressing  Goal: Rounds/Family Conference  Outcome: Progressing     Problem: Self-Care Deficit  Goal: Improved Ability to Complete Activities of Daily Living  Outcome: Progressing     Problem: Fall Injury Risk  Goal: Absence of Fall and Fall-Related Injury  Outcome: Progressing  Intervention: Promote Injury-Free Environment  Recent Flowsheet Documentation  Taken 03/12/2020 0000 by Ronnell Guadalajara, RN  Safety Interventions:   assistive device   bed alarm commode/urinal/bedpan at bedside   fall reduction program maintained   low bed   mobility aid   nonskid shoes/slippers when out of bed   room near unit station     Problem: Pain Chronic (Persistent) (Comorbidity Management)  Goal: Acceptable Pain Control and Functional Ability  Outcome: Progressing     Problem: Seizure Disorder Comorbidity  Goal: Maintenance of Seizure Control  Outcome: Progressing  Intervention: Maintain Seizure-Symptom Control  Recent Flowsheet Documentation  Taken 03/12/2020 0000 by Ronnell Guadalajara, RN  Seizure Precautions:   clutter-free environment maintained   side rails padded

## 2020-03-13 LAB — HEMOGLOBIN A1C
ESTIMATED AVERAGE GLUCOSE: 111 mg/dL
HEMOGLOBIN A1C: 5.5 % (ref 4.8–5.6)

## 2020-03-13 LAB — SODIUM: SODIUM: 131 mmol/L — ABNORMAL LOW (ref 135–145)

## 2020-03-13 MED ADMIN — sodium chloride tablet 1 g: 1 g | ORAL | @ 17:00:00

## 2020-03-13 MED ADMIN — lithium tablet 300 mg: 300 mg | ORAL | @ 23:00:00

## 2020-03-13 MED ADMIN — magnesium oxide (MAG-OX) tablet 800 mg: 800 mg | ORAL | @ 14:00:00

## 2020-03-13 MED ADMIN — naltrexone (DEPADE) tablet 50 mg: 50 mg | ORAL | @ 14:00:00

## 2020-03-13 MED ADMIN — gabapentin (NEURONTIN) capsule 200 mg: 200 mg | ORAL | @ 14:00:00

## 2020-03-13 MED ADMIN — thiamine mononitrate (vit B1) tablet 100 mg: 100 mg | ORAL | @ 14:00:00

## 2020-03-13 MED ADMIN — senna (SENOKOT) tablet 1 tablet: 1 | ORAL | @ 02:00:00

## 2020-03-13 MED ADMIN — sodium chloride tablet 1 g: 1 g | ORAL | @ 23:00:00

## 2020-03-13 MED ADMIN — lisinopriL (PRINIVIL,ZESTRIL) tablet 40 mg: 40 mg | ORAL | @ 14:00:00 | Stop: 2020-03-13

## 2020-03-13 MED ADMIN — gabapentin (NEURONTIN) capsule 200 mg: 200 mg | ORAL | @ 20:00:00

## 2020-03-13 MED ADMIN — OLANZapine (ZYPREXA) tablet 20 mg: 20 mg | ORAL | @ 02:00:00

## 2020-03-13 MED ADMIN — multivitamins, therapeutic with minerals tablet 1 tablet: 1 | ORAL | @ 14:00:00

## 2020-03-13 MED ADMIN — gabapentin (NEURONTIN) capsule 200 mg: 200 mg | ORAL | @ 02:00:00

## 2020-03-13 MED ADMIN — folic acid (FOLVITE) tablet 1 mg: 1 mg | ORAL | @ 14:00:00

## 2020-03-13 MED ADMIN — nicotine (NICODERM CQ) 21 mg/24 hr patch 1 patch: 1 | TRANSDERMAL | @ 14:00:00

## 2020-03-13 MED ADMIN — lithium tablet 300 mg: 300 mg | ORAL | @ 14:00:00

## 2020-03-13 MED ADMIN — sodium chloride tablet 1 g: 1 g | ORAL | @ 14:00:00

## 2020-03-13 NOTE — Unmapped (Signed)
Pt visited by wife in the evening. Pt is calm and cooperative. Ambulates with walker and no falls or injuries noted this shift. Pt uses the urinal. Complaint with Fluid Restrictions. Denies SI, HI, AVH. Stayed in his room reading his book before going to bed. Pt took night medications with no issues and no prn needed. No aggressive or unsafe behavior observed.  Problem: Adult Behavioral Health Plan of Care  Goal: Plan of Care Review  Outcome: Progressing  Goal: Patient-Specific Goal (Individualization)  Outcome: Progressing  Flowsheets (Taken 03/13/2020 0504)  Patient-Specific Goals (Include Timeframe): 'To get up and keep walking', 03/13/2020  Goal: Adheres to Safety Considerations for Self and Others  Outcome: Progressing  Goal: Absence of New-Onset Illness or Injury  Outcome: Progressing  Goal: Optimized Coping Skills in Response to Life Stressors  Outcome: Progressing  Goal: Develops/Participates in Therapeutic Alliance to Support Successful Transition  Outcome: Progressing  Goal: Rounds/Family Conference  Outcome: Progressing     Problem: Self-Care Deficit  Goal: Improved Ability to Complete Activities of Daily Living  Outcome: Progressing     Problem: Fall Injury Risk  Goal: Absence of Fall and Fall-Related Injury  Outcome: Progressing  Intervention: Promote Injury-Free Environment  Recent Flowsheet Documentation  Taken 03/12/2020 2000 by Ree Shay, RN  Safety Interventions:   assistive device   fall reduction program maintained   low bed   nonskid shoes/slippers when out of bed     Problem: Pain Chronic (Persistent) (Comorbidity Management)  Goal: Acceptable Pain Control and Functional Ability  Outcome: Progressing     Problem: Seizure Disorder Comorbidity  Goal: Maintenance of Seizure Control  Outcome: Progressing

## 2020-03-13 NOTE — Unmapped (Incomplete)
Emergency Medicine Access Physician [facility])  [facility] - Transfer Request Note    Requesting Provider: [facility]: {[facility]:22095}    Requesting Service: {Blank single:19197::Emergency Medicine}    {** Confirm patient is currently admitted to the ED at the requesting facility. If patient is not an emergency department patient (they are already admitted to an inpatient hospital service at the transferring hospital) please refer the case to the MAP or the appropriate inpatient [facility] service. DELETE THIS LINE BEFORE SIGNING NOTE **}    Reason for transfer request: ***    Overview of ED course at transferring hospital: Nicholas Rush is a 55 y.o. male ***    Was the patient accepted to the [facility] ED in transfer: {YES/NO:21013}  If no, why?: {Blank single:19197::Patient redirected to inpatient bed,Capacity case-by-case,ED capacity,N/A}    Accepting service/expected service information:  Has an inpatient or consulting service been contacted by the [facility], EMAP or the referring provider?: {YES/NO:21013}  If admitting/consult service contacted by [facility]:  Time of discussion: {Blank single:19197::Documented above in overview section,EMAP conferenced with the inpatient/consulting provider at the time of transfer request,Contacted outside of [facility] by the referring provider,Contacted through the [facility] after initial referral request to the [facility] through the [facility] prior to [facility] involement,N/A}  Name/service (e.g. Dr. Floyde Parkins, neprology) of inpatient team member/consultant: {Blank single:19197::Documented above in overview section,N/A}  Role of admit/consult service member: {Blank single:19197::Documented above in overview section,Attending,Fellow,Consult resident,N/A}  Brief summary of call: {Blank single:19197::Documented above in overview section,N/A}  Anticipated Inpatient Bed Type Needed: {LM Bed Type:26357::To be determined,N/A}    Outside Hospital Imaging:  Was Imaging Done at the Referring Hospital:  {YES/NO:21013}  If yes, what studies?: {Blank single:19197::N/A}  [facility] Aff[facility]:  {LM [facility]:50806::[place] affiliate, images available in Epic,***}
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
## 2020-03-13 NOTE — Unmapped (Signed)
Patient is compliant with prescribed medication and independent with ADL, no falls or injuries noted. Patient walked back and forth hallway this evening with walker.        Problem: Adult Behavioral Health Plan of Care  Goal: Plan of Care Review  Outcome: Progressing  Flowsheets (Taken 03/12/2020 1724)  Progress: improving  Plan of Care Reviewed With: patient  Patient Agreement with Plan of Care: agrees  Goal: Patient-Specific Goal (Individualization)  Outcome: Progressing  Flowsheets (Taken 03/12/2020 1724)  Patient-Specific Goals (Include Timeframe): 'To get up and keep walking', 03/12/2020  Patient Personal Strengths:   expressive of emotions   expressive of needs  Goal: Adheres to Safety Considerations for Self and Others  Outcome: Progressing  Goal: Absence of New-Onset Illness or Injury  Outcome: Progressing  Note: No new onset illness or injury.  Goal: Optimized Coping Skills in Response to Life Stressors  Outcome: Progressing  Goal: Develops/Participates in Therapeutic Alliance to Support Successful Transition  Outcome: Progressing  Goal: Rounds/Family Conference  Outcome: Progressing  Flowsheets (Taken 03/12/2020 1724)  Participants:   psychiatrist   patient   social work   nursing   case manager     Problem: Self-Care Deficit  Goal: Improved Ability to Complete Activities of Daily Living  Outcome: Progressing     Problem: Fall Injury Risk  Goal: Absence of Fall and Fall-Related Injury  Outcome: Progressing  Note: No falls or injury     Problem: Pain Chronic (Persistent) (Comorbidity Management)  Goal: Acceptable Pain Control and Functional Ability  Outcome: Progressing     Problem: Seizure Disorder Comorbidity  Goal: Maintenance of Seizure Control  Outcome: Progressing

## 2020-03-14 LAB — BASIC METABOLIC PANEL
ANION GAP: 10 mmol/L (ref 5–14)
BLOOD UREA NITROGEN: 16 mg/dL (ref 9–23)
BUN / CREAT RATIO: 21
CALCIUM: 9.6 mg/dL (ref 8.7–10.4)
CHLORIDE: 98 mmol/L (ref 98–107)
CO2: 22 mmol/L (ref 20.0–31.0)
CREATININE: 0.78 mg/dL
EGFR CKD-EPI AA MALE: >90 mL/min/{1.73_m2} (ref >=60)
EGFR CKD-EPI NON-AA MALE: >90 mL/min/{1.73_m2} (ref >=60)
GLUCOSE RANDOM: 129 mg/dL (ref 70–179)
POTASSIUM: 4.3 mmol/L (ref 3.4–4.5)
SODIUM: 130 mmol/L — ABNORMAL LOW (ref 135–145)

## 2020-03-14 LAB — SODIUM: SODIUM: 130 mmol/L — ABNORMAL LOW (ref 135–145)

## 2020-03-14 MED ORDER — OLANZAPINE 20 MG TABLET
ORAL_TABLET | Freq: Every evening | ORAL | 0 refills | 30 days
Start: 2020-03-14 — End: 2020-04-13

## 2020-03-14 MED ORDER — LITHIUM CARBONATE ER 300 MG TABLET,EXTENDED RELEASE
ORAL_TABLET | Freq: Two times a day (BID) | ORAL | 0 refills | 30 days
Start: 2020-03-14 — End: 2020-04-13

## 2020-03-14 MED ADMIN — iohexoL (OMNIPAQUE) 350 mg iodine/mL solution 100 mL: 100 mL | INTRAVENOUS | @ 22:00:00 | Stop: 2020-03-14

## 2020-03-14 MED ADMIN — lithium tablet 300 mg: 300 mg | ORAL | @ 13:00:00 | Stop: 2020-03-14

## 2020-03-14 MED ADMIN — folic acid (FOLVITE) tablet 1 mg: 1 mg | ORAL | @ 13:00:00

## 2020-03-14 MED ADMIN — senna (SENOKOT) tablet 1 tablet: 1 | ORAL | @ 01:00:00

## 2020-03-14 MED ADMIN — thiamine mononitrate (vit B1) tablet 100 mg: 100 mg | ORAL | @ 13:00:00

## 2020-03-14 MED ADMIN — magnesium oxide (MAG-OX) tablet 800 mg: 800 mg | ORAL | @ 13:00:00

## 2020-03-14 MED ADMIN — nicotine (NICODERM CQ) 21 mg/24 hr patch 1 patch: 1 | TRANSDERMAL | @ 13:00:00

## 2020-03-14 MED ADMIN — naltrexone (DEPADE) tablet 50 mg: 50 mg | ORAL | @ 13:00:00

## 2020-03-14 MED ADMIN — multivitamins, therapeutic with minerals tablet 1 tablet: 1 | ORAL | @ 13:00:00

## 2020-03-14 MED ADMIN — OLANZapine (ZYPREXA) tablet 20 mg: 20 mg | ORAL | @ 01:00:00

## 2020-03-14 MED ADMIN — gabapentin (NEURONTIN) capsule 200 mg: 200 mg | ORAL | @ 01:00:00

## 2020-03-14 MED ADMIN — gabapentin (NEURONTIN) capsule 200 mg: 200 mg | ORAL | @ 19:00:00

## 2020-03-14 MED ADMIN — sodium chloride tablet 1 g: 1 g | ORAL | @ 17:00:00

## 2020-03-14 MED ADMIN — sodium chloride tablet 1 g: 1 g | ORAL | @ 23:00:00

## 2020-03-14 MED ADMIN — sodium chloride tablet 1 g: 1 g | ORAL | @ 13:00:00

## 2020-03-14 MED ADMIN — gabapentin (NEURONTIN) capsule 200 mg: 200 mg | ORAL | @ 13:00:00

## 2020-03-14 NOTE — Unmapped (Signed)
VENOUS ACCESS TEAM PROCEDURE    Order was placed for a PIV by Venous Access Team (VAT).  Patient was assessed at bedside for placement of a PIV. Mask and protective eyewear were donned. Access was obtained. Blood return noted.  Dressing intact and device well secured.  Flushed with normal saline.  See LDA for details.  Pt advised to inform RN of any s/s of discomfort at the PIV site.    Workup / Procedure Time:  15 minutes       Care RN was notified.       Thank you,     Jacqulyn Liner RN Venous Access Team
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
## 2020-03-14 NOTE — Unmapped (Signed)
Patient has been medication compliant and cooperative with staff. He has attended groups. Interactions with staff have been polite and appropriate. No complications related to his CT scan. IV access removed. No unsafe behavior or falls at this time. Monitoring ongoing.       Problem: Adult Behavioral Health Plan of Care  Goal: Plan of Care Review  Outcome: Progressing  Flowsheets (Taken 03/14/2020 1456)  Progress: improving  Plan of Care Reviewed With: patient  Patient Agreement with Plan of Care: agrees  Goal: Patient-Specific Goal (Individualization)  Outcome: Progressing  Flowsheets (Taken 03/14/2020 1456)  Patient-Specific Goals (Include Timeframe): To walk and exercise.  Goal: Adheres to Safety Considerations for Self and Others  Outcome: Progressing  Goal: Absence of New-Onset Illness or Injury  Outcome: Progressing  Intervention: Prevent Skin Injury  Recent Flowsheet Documentation  Taken 03/14/2020 1610 by Arvid Right, RN  Skin Protection: protective footwear used  Intervention: Prevent Infection  Recent Flowsheet Documentation  Taken 03/14/2020 9604 by Arvid Right, RN  Infection Prevention:  ??? environmental surveillance performed  ??? equipment surfaces disinfected  ??? hand hygiene promoted  ??? personal protective equipment utilized  Goal: Optimized Coping Skills in Response to Life Stressors  Outcome: Progressing  Goal: Develops/Participates in Therapeutic Alliance to Support Successful Transition  Outcome: Progressing  Goal: Rounds/Family Conference  Outcome: Progressing  Flowsheets (Taken 03/14/2020 1456)  Participants:  ??? Milieu/Psych techs  ??? nursing  ??? patient  ??? psychiatrist  ??? social work     Problem: Self-Care Deficit  Goal: Improved Ability to Complete Activities of Daily Living  Outcome: Progressing     Problem: Pain Chronic (Persistent) (Comorbidity Management)  Goal: Acceptable Pain Control and Functional Ability  Outcome: Progressing     Problem: Seizure Disorder Comorbidity  Goal: Maintenance of Seizure Control  Outcome: Progressing  Intervention: Maintain Seizure-Symptom Control  Recent Flowsheet Documentation  Taken 03/14/2020 5409 by Arvid Right, RN  Seizure Precautions:  ??? clutter-free environment maintained  ??? side rails padded

## 2020-03-14 NOTE — Unmapped (Signed)
Patient on the porch visiting with wife at the beginning of the shift, after visiting pt went to his room.  Wife came to nurses and reported that she feels that husband is a little off.  Patient wife reports patient's hands are trembling, and his eyes are bloodshot.  Wife feels that patient is extremely anxious, would like physician to call her in the AM.  Patient took evening medication without difficulty, and went to bed.  Pt is alert and oriented, cooperative with care, had no complaints of pain is in no acute distress. Denies SI/HI & AVH, contracts for safety. '  Problem: Adult Behavioral Health Plan of Care  Goal: Plan of Care Review  Outcome: Progressing  Goal: Patient-Specific Goal (Individualization)  Outcome: Progressing  Flowsheets (Taken 03/13/2020 2358)  Patient-Specific Goals (Include Timeframe): To get up and keep walking by 03/14/20  Goal: Adheres to Safety Considerations for Self and Others  Outcome: Progressing  Goal: Absence of New-Onset Illness or Injury  Outcome: Progressing  Goal: Optimized Coping Skills in Response to Life Stressors  Outcome: Progressing  Goal: Develops/Participates in Therapeutic Alliance to Support Successful Transition  Outcome: Progressing  Goal: Rounds/Family Conference  Outcome: Progressing     Problem: Self-Care Deficit  Goal: Improved Ability to Complete Activities of Daily Living  Outcome: Progressing     Problem: Fall Injury Risk  Goal: Absence of Fall and Fall-Related Injury  Outcome: Progressing  Intervention: Promote Injury-Free Environment  Recent Flowsheet Documentation  Taken 03/13/2020 2100 by Laverta Baltimore, RN  Safety Interventions:   assistive device   elopement precautions   fall reduction program maintained   lighting adjusted for tasks/safety   low bed   nonskid shoes/slippers when out of bed     Problem: Pain Chronic (Persistent) (Comorbidity Management)  Goal: Acceptable Pain Control and Functional Ability  Outcome: Progressing     Problem: Seizure Disorder Comorbidity  Goal: Maintenance of Seizure Control  Outcome: Progressing

## 2020-03-14 NOTE — Unmapped (Signed)
Patient is compliant with prescribed medication and independent with ADL, walks with walker no falls or injuries noted. Patient denies SI/HI/AVH.        Problem: Adult Behavioral Health Plan of Care  Goal: Plan of Care Review  Outcome: Progressing  Flowsheets (Taken 03/13/2020 1829)  Progress: improving  Plan of Care Reviewed With: patient  Patient Agreement with Plan of Care: agrees  Goal: Patient-Specific Goal (Individualization)  Outcome: Progressing  Flowsheets (Taken 03/13/2020 1829)  Patient-Specific Goals (Include Timeframe): 'To get up and keep walking', 03/13/2020  Patient Personal Strengths:  ??? expressive of emotions  ??? expressive of needs  Goal: Adheres to Safety Considerations for Self and Others  Outcome: Progressing  Goal: Absence of New-Onset Illness or Injury  Outcome: Progressing  Note: No new onset illness or injury  Goal: Optimized Coping Skills in Response to Life Stressors  Outcome: Progressing  Goal: Develops/Participates in Therapeutic Alliance to Support Successful Transition  Outcome: Progressing  Goal: Rounds/Family Conference  Outcome: Progressing  Flowsheets (Taken 03/13/2020 1829)  Participants:  ??? patient  ??? nursing  ??? case manager  ??? psychiatrist  ??? social work     Problem: Self-Care Deficit  Goal: Improved Ability to Complete Activities of Daily Living  Outcome: Progressing     Problem: Fall Injury Risk  Goal: Absence of Fall and Fall-Related Injury  Outcome: Progressing  Note: No falls or injuries noted       Problem: Pain Chronic (Persistent) (Comorbidity Management)  Goal: Acceptable Pain Control and Functional Ability  Outcome: Progressing     Problem: Seizure Disorder Comorbidity  Goal: Maintenance of Seizure Control  Outcome: Progressing

## 2020-03-15 DIAGNOSIS — R195 Other fecal abnormalities: Principal | ICD-10-CM

## 2020-03-15 LAB — SODIUM: SODIUM: 131 mmol/L — ABNORMAL LOW (ref 135–145)

## 2020-03-15 MED ORDER — THERA-M TABLET
ORAL_TABLET | Freq: Every day | ORAL | 0 refills | 0 days | Status: CP
Start: 2020-03-15 — End: ?

## 2020-03-15 MED ORDER — THIAMINE MONONITRATE (VITAMIN B1) 100 MG TABLET
ORAL_TABLET | Freq: Every day | ORAL | 0 refills | 30.00000 days | Status: CN
Start: 2020-03-15 — End: 2020-04-14

## 2020-03-15 MED ORDER — LITHIUM CARBONATE ER 300 MG TABLET,EXTENDED RELEASE
ORAL_TABLET | Freq: Two times a day (BID) | ORAL | 0 refills | 30 days | Status: CP
Start: 2020-03-15 — End: 2020-04-14
  Filled 2020-03-15: qty 60, 30d supply, fill #0

## 2020-03-15 MED ORDER — SODIUM CHLORIDE 1 GRAM TABLET
ORAL_TABLET | Freq: Three times a day (TID) | ORAL | 2 refills | 34 days | Status: CP
Start: 2020-03-15 — End: 2021-03-15
  Filled 2020-03-15: qty 100, 34d supply, fill #0

## 2020-03-15 MED ORDER — THIAMINE HCL (VITAMIN B1) 100 MG TABLET
ORAL_TABLET | Freq: Every day | ORAL | 0 refills | 110 days | Status: CP
Start: 2020-03-15 — End: 2020-04-14

## 2020-03-15 MED ORDER — GABAPENTIN 100 MG CAPSULE
ORAL_CAPSULE | Freq: Three times a day (TID) | ORAL | 0 refills | 30 days | Status: CP
Start: 2020-03-15 — End: 2020-04-14
  Filled 2020-03-15: qty 180, 30d supply, fill #0

## 2020-03-15 MED ORDER — MULTIVITAMIN-IRON 9 MG-FOLIC ACID 400 MCG-CALCIUM AND MINERALS TABLET
ORAL_TABLET | Freq: Every day | ORAL | 0 refills | 30.00000 days | Status: CN
Start: 2020-03-15 — End: 2020-04-14

## 2020-03-15 MED ORDER — LISINOPRIL 20 MG TABLET
ORAL_TABLET | Freq: Every day | ORAL | 0 refills | 30.00000 days | Status: CN
Start: 2020-03-15 — End: 2020-04-14
  Filled 2020-03-15: qty 30, 30d supply, fill #0

## 2020-03-15 MED ORDER — NALTREXONE 50 MG TABLET
ORAL_TABLET | Freq: Every day | ORAL | 0 refills | 30 days | Status: CP
Start: 2020-03-15 — End: ?
  Filled 2020-03-15: qty 30, 30d supply, fill #0

## 2020-03-15 MED ORDER — OLANZAPINE 20 MG TABLET
ORAL_TABLET | Freq: Every evening | ORAL | 0 refills | 30.00000 days | Status: CN
Start: 2020-03-15 — End: 2020-04-14
  Filled 2020-03-15: qty 30, 30d supply, fill #0

## 2020-03-15 MED ORDER — MAGNESIUM OXIDE 400 MG (241.3 MG MAGNESIUM) TABLET
ORAL_TABLET | Freq: Every day | ORAL | 0 refills | 30.00000 days | Status: CP
Start: 2020-03-15 — End: 2020-04-14
  Filled 2020-03-15: qty 120, 60d supply, fill #0

## 2020-03-15 MED ADMIN — naltrexone (DEPADE) tablet 50 mg: 50 mg | ORAL | @ 13:00:00 | Stop: 2020-03-15

## 2020-03-15 MED ADMIN — gabapentin (NEURONTIN) capsule 200 mg: 200 mg | ORAL | @ 01:00:00

## 2020-03-15 MED ADMIN — sodium chloride tablet 1 g: 1 g | ORAL | @ 17:00:00 | Stop: 2020-03-15

## 2020-03-15 MED ADMIN — lithium (LITHOBID) ER tablet 300 mg: 300 mg | ORAL | @ 01:00:00

## 2020-03-15 MED ADMIN — thiamine mononitrate (vit B1) tablet 100 mg: 100 mg | ORAL | @ 13:00:00 | Stop: 2020-03-15

## 2020-03-15 MED ADMIN — multivitamins, therapeutic with minerals tablet 1 tablet: 1 | ORAL | @ 13:00:00 | Stop: 2020-03-15

## 2020-03-15 MED ADMIN — lisinopriL (PRINIVIL,ZESTRIL) tablet 20 mg: 20 mg | ORAL | @ 13:00:00 | Stop: 2020-03-15

## 2020-03-15 MED ADMIN — OLANZapine (ZYPREXA) tablet 20 mg: 20 mg | ORAL | @ 01:00:00

## 2020-03-15 MED ADMIN — lithium (LITHOBID) ER tablet 300 mg: 300 mg | ORAL | @ 13:00:00 | Stop: 2020-03-15

## 2020-03-15 MED ADMIN — senna (SENOKOT) tablet 1 tablet: 1 | ORAL | @ 01:00:00

## 2020-03-15 MED ADMIN — magnesium oxide (MAG-OX) tablet 800 mg: 800 mg | ORAL | @ 13:00:00 | Stop: 2020-03-15

## 2020-03-15 MED ADMIN — nicotine (NICODERM CQ) 21 mg/24 hr patch 1 patch: 1 | TRANSDERMAL | @ 13:00:00 | Stop: 2020-03-15

## 2020-03-15 MED ADMIN — sodium chloride tablet 1 g: 1 g | ORAL | @ 13:00:00 | Stop: 2020-03-15

## 2020-03-15 MED ADMIN — folic acid (FOLVITE) tablet 1 mg: 1 mg | ORAL | @ 13:00:00 | Stop: 2020-03-15

## 2020-03-15 MED ADMIN — gabapentin (NEURONTIN) capsule 200 mg: 200 mg | ORAL | @ 20:00:00 | Stop: 2020-03-15

## 2020-03-15 MED ADMIN — gabapentin (NEURONTIN) capsule 200 mg: 200 mg | ORAL | @ 13:00:00 | Stop: 2020-03-15

## 2020-03-15 NOTE — Unmapped (Signed)
To schedule an appointment for Physical Therapy & Occupational Therapy, call the [facility] at [phone].

## 2020-03-15 NOTE — Unmapped (Signed)
Patient remains calm, cooperative, pleasant and medication compliant. Denies SI/HI/AVH, denies any pain or concerns. No falls or seizures this shift. Spent the evening reading books in his room. Wife called @ 2155 expressing concerns that husband has no lithium level result yet and husband is being discharged tomorrow. Instructed to call and speak to the team in the morning about her concerns. Will continue to support and maintain safety.   Problem: Adult Behavioral Health Plan of Care  Goal: Plan of Care Review  Outcome: Progressing  Flowsheets (Taken 03/14/2020 2239)  Progress: improving  Plan of Care Reviewed With: patient  Patient Agreement with Plan of Care: agrees  Goal: Patient-Specific Goal (Individualization)  Outcome: Progressing  Flowsheets (Taken 03/14/2020 2239)  Patient-Specific Goals (Include Timeframe): To go home tomorrow, 03/15/2020  Patient Personal Strengths:   expressive of emotions   expressive of needs   family/social support   medication/treatment adherence  Goal: Adheres to Safety Considerations for Self and Others  Outcome: Progressing  Goal: Absence of New-Onset Illness or Injury  Outcome: Progressing  Intervention: Prevent Infection  Recent Flowsheet Documentation  Taken 03/14/2020 2000 by Ronnell Guadalajara, RN  Infection Prevention:   hand hygiene promoted   rest/sleep promoted  Goal: Optimized Coping Skills in Response to Life Stressors  Outcome: Progressing  Goal: Develops/Participates in Therapeutic Alliance to Support Successful Transition  Outcome: Progressing  Goal: Rounds/Family Conference  Outcome: Progressing     Problem: Self-Care Deficit  Goal: Improved Ability to Complete Activities of Daily Living  Outcome: Progressing     Problem: Fall Injury Risk  Goal: Absence of Fall and Fall-Related Injury  Outcome: Progressing  Intervention: Promote Injury-Free Environment  Recent Flowsheet Documentation  Taken 03/14/2020 2000 by Ronnell Guadalajara, RN  Safety Interventions:   assistive device   bed alarm   fall reduction program maintained   lighting adjusted for tasks/safety   low bed   nonskid shoes/slippers when out of bed   room near unit station   seizure precautions     Problem: Pain Chronic (Persistent) (Comorbidity Management)  Goal: Acceptable Pain Control and Functional Ability  Outcome: Progressing     Problem: Seizure Disorder Comorbidity  Goal: Maintenance of Seizure Control  Outcome: Progressing  Intervention: Maintain Seizure-Symptom Control  Recent Flowsheet Documentation  Taken 03/14/2020 2000 by Ronnell Guadalajara, RN  Seizure Precautions:   clutter-free environment maintained   side rails padded

## 2020-03-15 NOTE — Unmapped (Signed)
Nicholas Rush denied SI/HI and hallucinations when I asked him this morning. He spent the day in his patient room. No unsafe behaviors observed as of this note. Pt scheduled to be discharged today. Will continue to monitor.    Problem: Adult Behavioral Health Plan of Care  Goal: Plan of Care Review  Outcome: Resolved  Goal: Patient-Specific Goal (Individualization)  Outcome: Resolved  Goal: Adheres to Safety Considerations for Self and Others  Outcome: Resolved  Goal: Absence of New-Onset Illness or Injury  Outcome: Resolved  Intervention: Prevent Infection  Recent Flowsheet Documentation  Taken 03/15/2020 0800 by Runell Gess, RN  Infection Prevention: environmental surveillance performed  Goal: Optimized Coping Skills in Response to Life Stressors  Outcome: Resolved  Goal: Develops/Participates in Therapeutic Alliance to Support Successful Transition  Outcome: Resolved  Goal: Rounds/Family Conference  Outcome: Resolved     Problem: Self-Care Deficit  Goal: Improved Ability to Complete Activities of Daily Living  Outcome: Resolved     Problem: Fall Injury Risk  Goal: Absence of Fall and Fall-Related Injury  Outcome: Resolved  Intervention: Promote Injury-Free Environment  Recent Flowsheet Documentation  Taken 03/15/2020 0800 by Runell Gess, RN  Safety Interventions:   bed alarm   fall reduction program maintained   elopement precautions   environmental modification   low bed   nonskid shoes/slippers when out of bed   room near unit station     Problem: Pain Chronic (Persistent) (Comorbidity Management)  Goal: Acceptable Pain Control and Functional Ability  Outcome: Resolved     Problem: Seizure Disorder Comorbidity  Goal: Maintenance of Seizure Control  Outcome: Resolved  Intervention: Maintain Seizure-Symptom Control  Recent Flowsheet Documentation  Taken 03/15/2020 0800 by Runell Gess, RN  Seizure Precautions: side rails padded

## 2020-03-15 NOTE — Unmapped (Signed)
[facility]  Psychiatry   Discharge Summary    Admit date and time: 03/08/2020 11:46 PM  Discharge date and time: 03/15/20 PM  Discharge to: Home  Discharge Service: Psychiatry (PSY)  Discharge Attending Physician: Bryn Gulling, MD  Discharge Unit 24-7 Contact Number: [phone] 5NS[facility]-child (Crisis)    Allergies: Tramadol and Hydroxyzine    Chief Concern/Admitting Diagnosis:   Medication adjustment, diagnostic clarification of bipolar disorder    Discharge Diagnosis:   Active Problems:    Bipolar 1 and/or TBI and/or unspecified mood disorder POA: Yes    Hyponatremia POA: Yes  Resolved Problems:    * No resolved hospital problems. *    Stressors: significant medical illness    Disability Assessment Scale: Clinically assessed as mild disability, based on WHODAS 2.0     Discharge Day Services:    Hours of sleep overnight : 7.25 hrs.    The treatment team, including resident and attending physicians, nursing staff and social work/case management , met to discuss the patient's progress and plan of care.    Per 03/14/20 RN Epic note: Patient remains calm, cooperative, pleasant and medication compliant. Denies SI/HI/AVH, denies any pain or concerns. No falls or seizures this shift. Spent the evening reading books in his room. Wife called @ 2155 expressing concerns that husband has no lithium level result yet and husband is being discharged tomorrow. Instructed to call and speak to the team in the morning about her concerns. Will continue to support and maintain safety.     MD interview: Patient reports feeling alright today. His wife brought in MRI reports of lumbar spine imaging performed in September 2021 which were placed in his chart. He says he hasn't heard anything about outpatient physical therapy and requests follow up. He notes his appointment with his usual psychiatrist would not be until April, so they are having him seen next week by another clinician. His sleep and mood are okay. He denies SI, AH/VH, racing thoughts. Agreeable to discharge today.    Spoke to patient's wife on the phone for [phone]. Updated her on result of CT scan, plan for PT/OT referrals, and referral for colonoscopy. Confirmed plan for discharge this afternoon and she will be picking him up.    Attempted to reach patient's desired outpatient PT location to confirm whether they would be able to start seeing him within the week. Called at 1033 but no answer. Left a message asking the above and requesting a return call, but did not receive a call back.    ROS: as per interview above, otherwise negative    Vitals:   Patient Vitals for the past 12 hrs:   BP Temp Temp src Pulse Resp SpO2   03/15/20 0816 110/67 ??? ??? ??? ??? ???   03/15/20 0638 116/64 36.3 ??C Temporal 65 16 99 %       Height:  185.4 cm (6' 1)  Weight: 90.4 kg (199 lb 3 oz)  BMI: Body mass index is 26.28 kg/m??.      Mental Status Exam:  Appearance:  ??  Appears stated age and Disheveled   Motor: ??  No abnormal movements; ambulates with rollator with stooped posture   Speech/Language:  ??  Normal rate, volume, tone, fluency   Mood: ??   alright   Affect: ??  Calm, Cooperative and Euthymic   Thought process: ??  Logical, linear, clear, coherent, goal directed   Thought content:   ??  Denies SI, HI, self harm,  delusions, obsessions, paranoid ideation, or ideas of reference   Perceptual disturbances:   ??  Denies auditory and visual hallucinations, behavior not concerning for response to internal stimuli  ??   Orientation: ??  Oriented to person, place, time, and general circumstances   Attention: ??  Able to fully attend without fluctuations in consciousness   Concentration: ??  Able to fully concentrate and attend   Memory: ??  Immediate, short-term, long-term, and recall grossly intact    Fund of knowledge:  ??   Not formally assessed   Insight:   ??  Fair   Judgment:  ??  Fair   Impulse Control: ??  Fair     PE:  General: NAD  HEENT: [place]/AT  Lungs: normal WOB  Neuro: no gross deficits    Test Results:  Data Review:   Lab results last 24 hours:    Recent Results (from the past 24 hour(s))   Sodium    Collection Time: 03/15/20  8:58 AM   Result Value Ref Range    Sodium 131 (L) 135 - 145 mmol/L     Imaging: Radiology report(s) reviewed.  Pending Test Results: No pending test or studies    Psychometrics: Not applicable    Hospital Course:     -- Will need outpatient colonoscopy, referral to GI placed at discharge  -- Will need to obtain lithium level 2/28 - AM draw before he takes dose. Relayed to outpatient psych provider who will order this during the pt's appointment on 2/28.  -- PT/OT referrals placed at discharge  -- Will need repeat low-dose screening CT chest in 1 year (Feb 2023)  -- Recommend monitoring weekly sodium at least until stable lithium dose is reached and sodium remains stable  -- May consider alternative antihypertensive in the future while on lithium therapy  -- Patient complaining of anxiety inpatient which may have been situational. Would reassess outpatient and could titrate gabapentin to help with both chronic pain and anxiety    Reason for Admission: Nicholas Rush is a 55 y.o. male with a history of mania from likely bipolar 1 disorder, TBI, alcohol use disorder, and chronic hyponatremia who was admitted voluntarily to the [facility] Crisis Stabilization unit for safety, stabilization, and management of medications. Please refer to full History and Physical note for details.    Monitoring: The level of observation on unit was initially During this time of the global COVID-19 pandemic and in following the overall hospital protocol, the use of a procedural mask by patients as a medical device is indicated to limit the spread of the disease. Will initiate q15 min safety check for unpredictable behavior based on my risk assessment. I have reviewed the chart, interviewed the patient, and taken into consideration the suicide risk factors.  q15 min checks and off unit, Restrict to unit. The patient did not have any episodes of  aggression, agitation, self-injurious behavior, inappropriate behavior, and attempted elopement while admitted. The patient was able to maintain safe behavior on the unit before discharge, but in the setting of COVID19 pandemic and mask requirements for patients, safety checks are maintained at every 15 minutes.    Psychiatry: The patient was offered the following resources during their hospitalization: psychiatric physician and nursing services, clinical case management, occupational and recreational therapies, and physical therapy. The aforementioned disciplines established multidisciplinary treatment plan(s) within 24 hours of admission, which was discussed on a daily basis and updated weekly. The patient had access to individual, group, and  milieu therapeutic modalities.     Diagnostic clarification was achieved through serial mental status examinations, behavioral observations, collateral obtained from family, outpatient providers, previous medical records. Rating scale results were reviewed, compared to clinical observations, and were used to inform treatment decisions. The patient was transferred from a medical hospitalization for acute on chronic hyponatremia after he had been stabilized and evaluated by Nephrology and Psychiatry. 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 His sodium was stabilized to apparent baseline of 130 with 1.5L fluid restriction and salt tablets. After discussion with patient and family, they preferred discontinuation of Depakote not only because of risk of SIADH but also because patient feels it may have caused dysgeusia. It is important to note that his hyponatremia, per chart review, predates initiation of Depakote so it could still serve as a suitable mood stabilizer in the future if needed. While patient had not exhibited any signs or symptoms of mania during medical hospitalization, his multiple recent hospitalizations for manic behavior warranted inpatient medication adjustment for close monitoring. It is still felt that his history of symptoms including decreased need for sleep, increased goal directed activity, elevated mood, pressured speech, and disorganization are consistent with Bipolar 1, especially given strong family history. Complicated by late-in-life diagnosis, but it is possible that TBI last year may have contributed to increased impulsivity, and patient is intermittently adherent with medications. There was a discussion of side effects, risks, benefits, alternatives, and indications for treatment with lithium, including but not limited to polydipsia, thyroid dysfunction, renal dysfunction, tremor, GI upset, and sodium abnormalities. The patient and wife asked appropriate questions, acknowledged understanding of answers, and provided informed consent to initiation of lithium to target mood stabilization. This medication was chosen because of a family member's positive treatment response to this class of medication and prior medication trials with alternate medications, including Depakote . Lamictal was considered but the long titration schedule made close inpatient monitoring during a medication change impractical and not in line with patient's goal for hospitalization. The patient was maintained on Zyprexa and the dose was increased to 20mg  nightly. It was hoped that patient could remain on monotherapy of Zyprexa for maintenance treatment as he did not exhibit any symptoms of mania with taper of Depakote. However patient and wife strongly preferred he be treated with a mood stabilizer. With regard to medication tolerability, the patient tolerated them well and did not report any bothersome side effects.    During the patient???s hospital course, he maintained mood stability.  The patient???s participation in therapeutic groups, 1:1 interactions with staff, and family interactions were appropriate. The team maintained regular contact with family and outpatient providers throughout the admission for ongoing inpatient management and disposition planning.    The patient will return to home at the time of discharge. The patient will follow up with outpatient services, including [facility] for psychiatric and medical management. The treatment team provided psycho-education and made recommendations regarding cessation of substance use, medication adherence, and healthy lifestyle modifications . Recommendation for discharge safety planning included: removal of all firearms from the home, locking all sharps in a secure container, locking all weapons and dangerous objects in a secure container, and locking all medications in a secure container.    Other Medical Issues: Admissions labs were reviewed and found to be remarkable for Na 128 . Further admission lab work was not performed as he had just been discharged from medical hospitalization. An EKG was not performed but  EKG 01/03/20 was NSR with Qtc . Additional laboratory monitoring included: daily sodium which remained stable at 130-131 during admission. The patient was monitored for physical complaints, including potential medication side effects. The following medical problems were addressed during the patient's admission: Regarding his hyponatremia, part of the purpose of this admission was to see if Depakote was causing SIADH, judging by whether sodium would improve as it was tapered. Rather sodium remained stable at ~130 throughout this time while he was maintained on 1.5L fluid restriction and TID salt tabs. He seemed to remain at what appears to be his cognitive baseline throughout his admission. He reports low back pain that seems to be chronic but that is still significantly limiting his mobility. He used a rollator to get around most of the time. Consideration was given to tapering gabapentin as this can have cognitive side effects and affect balance, but patient disagreed as he says it helps his low back pain. He was seen by PT and OT due to deconditioning, and referrals were placed for him to continue with them outpatient. There was also consideration of the possibility of underlying malignancy causing SIADH. Screening lung CT was done given his smoking history and showed subcentimeter nodules that will require repeat imaging in 1 year. He also has a history of a positive Cologuard in 2018 but never had a colonoscopy, and here he had low back pain and some intermittent constipation raising concern for colorectal malignancy. CT A/P was performed to evaluate for malignancy since inpatient colonoscopy was unlikely to be performed, and it did not show any evidence of metastatic disease or malignancy in the abdomen or pelvis. An ambulatory referral to GI was made for outpatient colonoscopy. His blood pressures were noted to be in the 90s/60s on occasion, so lisinopril was decreased from 40mg  to 20mg  daily. May consider alternative agent in the future while he is on lithium therapy. Patient's wife noted a mild tremor which is postural. Patient is unsure if this is entirely new because he has had it before, but does not find it bothersome at this time. Metabolic monitoring was performed by checking hemoglobin A1c on 03/13/20 (5.5%) and a lipid panel during his prior admission on 02/22/20 (HDL 101, LDL 44)    Risk Assessment: On the day of discharge, the patient was evaluated by the attending MD and was discussed with the multidisciplinary treatment team. The treatment team has determined the patient to be stable and appropriate for discharge with medical approval. Day of discharge assessment included a suicide and violence risk assessment. Risk factors for self-harm/suicide that are present at time of discharge include: past head injury.   A violence risk assessment was performed on the day of discharge. Risk factors for aggression/violence that are present at time of discharge include: male gender and past substance abuse. These risk factors are mitigated by the following factors:lack of active SI/HI, no know access to weapons or firearms, no history of previous suicide attempts , no history of violence, motivation for treatment, utilization of positive coping skills, supportive family, presence of a significant relationship, presence of an available support system, enjoyment of leisure actvities, current treatment compliance, and safe housing. Furthermore, the treatment team has attempted to mitigate risk through supportive psychotherapy, providing psycho-education, thoughtful medication management, and communication with outpatient providers for continuity of care. The team educated the patient about relevant modifiable risk factors including following recommendations for treatment of psychiatric illness and abstaining from substance abuse.    While future psychiatric events  cannot be accurately predicted, the patient does not currently require further acute inpatient psychiatric care and does not currently meet [facility] involuntary commitment criteria.  It is recommended that the patient continue treatment in outpatient care. A follow up plan and crisis plan are in place, have been discussed with the patient, and they agree to the plan at time of discharge.         Condition at Discharge: fair  Discharge Medications:      Your Medication List      STOP taking these medications    divalproex 500 MG DR tablet  Commonly known as: DEPAKOTE        START taking these medications    lithium 300 MG ER tablet  Commonly known as: LITHOBID  Take 1 tablet (300 mg total) by mouth Two (2) times a day. Avoid medications like ibuprofen, naproxen while taking.     magnesium oxide 400 mg (241.3 mg elemental) tablet  Commonly known as: MAG-OX  Take 2 tablets (800 mg total) by mouth daily.        CHANGE how you take these medications lisinopriL 20 MG tablet  Commonly known as: PRINIVIL,ZESTRIL  Take 1 tablet (20 mg total) by mouth daily.  What changed:   ?? medication strength  ?? how much to take     OLANZapine 20 MG tablet  Commonly known as: ZyPREXA  Take 1 tablet (20 mg total) by mouth nightly.  What changed:   ?? medication strength  ?? how much to take        CONTINUE taking these medications    acetaminophen 500 MG tablet  Commonly known as: TYLENOL  Take 500 mg by mouth every six (6) hours as needed for pain.     CALCIUM 500 + D ORAL  Take 1 tablet by mouth daily.     cetirizine 10 MG tablet  Commonly known as: ZyrTEC  Take 10 mg by mouth daily.     fluticasone propionate 50 mcg/actuation nasal spray  Commonly known as: FLONASE  2 sprays into each nostril daily.     folic acid 1 MG tablet  Commonly known as: FOLVITE  Take 1 tablet (1 mg total) by mouth daily.     gabapentin 100 MG capsule  Commonly known as: NEURONTIN  Take 2 capsules (200 mg total) by mouth Three (3) times a day.     HUMIRA(CF) 40 mg/0.4 mL injection  Generic drug: adalimumab  Inject the contents of 1 syringe (40 mg) under the skin every 14 days     naltrexone 50 mg tablet  Commonly known as: DEPADE  Take 1 tablet (50 mg total) by mouth daily.     nicotine 21 mg/24 hr patch  Commonly known as: NICODERM CQ  Place 1 patch on the skin daily.     nicotine polacrilex 4 MG gum  Commonly known as: NICORETTE  Apply 1 each (4 mg total) to cheek every hour as needed for smoking cessation.     omeprazole 20 MG capsule  Commonly known as: PriLOSEC  TAKE 1 CAPSULE(20 MG) BY MOUTH EVERY DAY     SENNA LAX ORAL  Take 1 tablet by mouth daily as needed (constipation).     sodium chloride 1 gram tablet  Take 1 tablet (1 g total) by mouth Three (3) times a day with a meal.     THERA-M Tab  Generic drug: multivitamin,tx-iron-minerals  Take 1 tablet by mouth daily.     thiamine  100 MG tablet  Commonly known as: B-1  Take 1 tablet (100 mg total) by mouth daily.     TUMS 200 mg calcium (500 mg) chewable tablet  Generic drug: calcium carbonate  Chew 2 tablets daily as needed for heartburn.            Advanced Directives:  Does patient have an advance directive covering medical treatment?: Patient does not have advance directive covering medical treatment.  Reason patient does not have an advance directive covering medical treatment:: Patient does not wish to complete one at this time.    Healthcare Decision Maker: Patient does not wish to appoint a Health Care Decision Maker at this time  Extended Emergency Contact Information  Primary Emergency Contact: Bisbee,[name]  Address: [address] [place], [place] [address] [place] [employer] Phone: [phone]  Relation: Spouse  Preferred language: ENGLISH  Interpreter needed? No  Information offered on HCDM, Medical & Mental Health advance directives:: Patient declined information.   Does patient have an advance directive covering mental health treatment?: Patient does not have advance directive covering mental health treatment.  Reason patient does not have an advance directive covering mental health treatment:: Patient does not wish to complete one at this time.    Discharge Instructions:     Other Instructions     Discharge instructions      HOSPITAL TRANSITION INFORMATION:  Reason for admission: You were hospitalized for medication changes and continued monitoring of your sodium level.    Discharge Diagnosis:   Active Problems:    Bipolar 1 and/or TBI and/or unspecified mood disorder    Hyponatremia    Test Results:  Data Review: First and last BMP/CMP-  Lab Results - Current Encounter       Component                Value               Date/Time                  NA                       131 (L)             03/15/2020 0858            NA                       128 (L)             03/10/2020 0712            K                        4.3                 03/14/2020 0945            K                        4.4                 03/10/2020 0712 CL                       98  03/14/2020 0945            CL                       93 (L)              03/10/2020 0712            CO2                      22.0                03/14/2020 0945            CO2                      29.0                03/10/2020 0712            BUN                      16                  03/14/2020 0945            BUN                      14                  03/10/2020 0712            CREATININE               0.78                03/14/2020 0945            CREATININE               0.67                03/10/2020 0712            GLU                      129                 03/14/2020 0945            GLU                      90                  03/10/2020 0712            CALCIUM                  9.6                 03/14/2020 0945            CALCIUM                  9.3                 03/10/2020 0712            ALBUMIN                  3.1 (L)             03/10/2020 1610  PROT                     6.7                 03/10/2020 0712            BILITOT                  0.4                 03/10/2020 0712            ALT                      10                  03/10/2020 0712            AST                      15                  03/10/2020 0712            ALKPHOS                  60                  03/10/2020 0712       HgbA1C- Lab Results - Current Encounter       Component                Value               Date/Time                  A1C                      5.5                 03/13/2020 0950       Lipid Panel - 02/22/20: CHOL 154, TRIG 46, HDL 101, LDL 44  TSH- 02/23/20: TSH 0.445, T3 2.35, T4 1.81  Imaging: Radiology report(s) reviewed.   Encounter Date: 01/03/20  -ECG 12 Lead:        Result                      Value                           EKG Systolic BP                                             EKG Diastolic BP                                            EKG Ventricular Rate        71                              EKG Atrial Rate             71 EKG P-R Interval  122                             EKG QRS Duration            82                              EKG Q-T Interval            380                             EKG QTC Calculation         412                             EKG Calculated P Axis       27                              EKG Calculated R Axis       44                              EKG Calculated T Axis       31                              QTC Fredericia              402                                                                                      Narrative    NORMAL SINUS RHYTHM  NORMAL ECG  WHEN COMPARED WITH ECG OF 02-Dec-2019 08:00,  NO SIGNIFICANT CHANGE WAS FOUND  Confirmed by Mariane Baumgarten ([id number]) on 01/04/2020 4:55:56 PM     Pending Test Results: No pending test or studies    Advanced Directives:  Does patient have an advance directive covering medical treatment?: Patient does not have advance directive covering medical treatment.  Reason patient does not have an advance directive covering medical treatment:: Patient does not wish to complete one at this time.    Healthcare Decision Maker: Patient does not wish to appoint a Health Care Decision Maker at this time  Extended Emergency Contact Information  Primary Emergency Contact: Bisbee,[name]  Address: [address] [place], [place] [address] [place] [employer] Phone: [phone]  Relation: Spouse  Preferred language: ENGLISH  Interpreter needed? No  Information offered on HCDM, Medical & Mental Health advance directives:: Patient declined information.   Does patient have an advance directive covering mental health treatment?: Patient does not have advance directive covering mental health treatment.  Reason patient does not have an advance directive covering mental health treatment:: Patient does not wish to complete one at this time.  Discharge Unit 24-7 Contact Number: [phone] 5NS[facility]-child (Crisis)    DISCHARGE INSTRUCTIONS:  -Please continue taking medications as prescribed. Try to continue to adhere to a fluid restriction of 1.5L/day as much as possible.    -Please refrain from using illicit substances and alcohol, as these can affect your mood and could cause anxiety or other concerning symptoms.    -Your outpatient provider will monitor your labs as indicated    -Referrals were placed for outpatient PT/OT as well as to Gastroenterology to get scheduled for a colonoscopy. They should contact you for scheduling.    -For lithium therapy, your provider will check a lithium level, TSH, and chemistry panel as needed, routinely every 3 months at first and then every 6 months. When taking lithium, please drink water while keeping in mind the fluid restriction. Also, if you have pain, you may take tylenol (acetominophen), but you cannot take NSAIDS such as motrin (ibuprophen), as this may increase your lithium level. Signs of lithium toxicity may include slurred speech, unsteady gait, confusion and worsening tremor. Please contact your provider if these symptoms develop.    -For antipsychotic therapy, your provider will check your weight, lipid panel, and blood glucose (Hemoglobin A1c) periodically    -As a condition of discharge, you agree to follow-up with appropriate outpatient psychiatric providers. You have been provided prescriptions for a limited supply of your medications. All refills will need to be handled by your outpatient provider.    -Do not make changes to your medications, including taking more or less than prescribed, unless under the supervision of your physician. Be aware that some medications may make you feel worse if abruptly stopped.    -Seek further medical care for any increase in symptoms or new symptoms, such as thoughts of wanting to hurt yourself, hurt others, or if you have increased agitation.    -For immediate concerns, call 911 for emergency medical attention.    -Emergency services are available 24 hours a day at [facility] to get assistance in deciding appropriate care during periods of psychiatric concern.    -Consider a psychiatric advanced directive at [email or web address]        Appointments which have been scheduled for you    Mar 18, 2020  8:30 AM  (Arrive by 8:15 AM)  RETURN  STEP with Park Pope, MD  [facility] PSYCHIATRY STEP [facility]) [address] [place] [address]  [phone]      Mar 18, 2020  9:30 AM  (Arrive by 9:15 AM)  RETURN INTEGRATED CARE with Rupal Doreatha Lew, MD  [facility] STEP PRIMARY CARE [place] [facility]) [address]  [place] [address]  [phone]      Apr 22, 2020  9:00 AM  (Arrive by 8:45 AM)  RETURN  30 with [name], DO  [facility] [place] [facility]) [address]  [place] [address]  [phone]      Apr 29, 2020  9:15 AM  (Arrive by 9:00 AM)  RETURN  STEP with Maryan Char, MD  [facility] PSYCHIATRY STEP [place] [facility]) [address]  [place] [address]  [phone]      Jun 03, 2020 10:30 AM  (Arrive by 10:15 AM)  RETURN  STEP with Will Karsten Fells, MD  [facility] STEP [place]) [address]  [place]  [address]  [phone]      Jul 08, 2020  9:15 AM  (Arrive by 9:00 AM)  RETURN  STEP with Maryan Char, MD  [facility] PSYCHIATRY STEP [facility]) [address] [place] [address]  [phone]      Additional instructions:      To schedule an appointment for Physical Therapy & Occupational Therapy, call the [facility] for [facility] Care at [phone].              Patient was seen and plan of care was discussed with the Attending MD, Dr. Perrin Maltese, who agrees with the above statement and plan.    I spent greater than 30 minutes in the discharge of this patient.    Doreatha Massed, MD

## 2020-03-17 NOTE — Unmapped (Signed)
Received call from protocol for urgent request 2/26 around 7pm. Pt discharged from Crisis 2/25, wife now reporting AMS. He was switched from Depakote to lithium and having significant side effects. History of seizures. Side effects worsened overnight. Very confused, doesn???t remember why he was at the hospital, hands and arms trembling, brain fog/disoriented. Recommended presenting to nearest ED.

## 2020-03-18 ENCOUNTER — Ambulatory Visit: Admit: 2020-03-18 | Discharge: 2020-03-24 | Disposition: A | Discharge status: Home / Self Care | Payer: MEDICAID

## 2020-03-18 ENCOUNTER — Telehealth: Admit: 2020-03-18 | Discharge: 2020-03-19 | Payer: MEDICAID | Attending: Psychiatry | Primary: Psychiatry

## 2020-03-18 DIAGNOSIS — Z79899 Other long term (current) drug therapy: Principal | ICD-10-CM

## 2020-03-18 DIAGNOSIS — F1020 Alcohol dependence, uncomplicated: Principal | ICD-10-CM

## 2020-03-18 DIAGNOSIS — F319 Bipolar disorder, unspecified: Principal | ICD-10-CM

## 2020-03-18 LAB — CBC W/ AUTO DIFF
BASOPHILS ABSOLUTE COUNT: 0 10*9/L (ref 0.0–0.1)
BASOPHILS RELATIVE PERCENT: 0.3 %
EOSINOPHILS ABSOLUTE COUNT: 0.1 10*9/L (ref 0.0–0.4)
EOSINOPHILS RELATIVE PERCENT: 1.4 %
HEMATOCRIT: 38.4 % — ABNORMAL LOW (ref 41.0–53.0)
HEMOGLOBIN: 13.3 g/dL — ABNORMAL LOW (ref 13.5–17.5)
LARGE UNSTAINED CELLS: 1 % (ref 0–4)
LYMPHOCYTES ABSOLUTE COUNT: 1.2 10*9/L — ABNORMAL LOW (ref 1.5–5.0)
LYMPHOCYTES RELATIVE PERCENT: 11.3 %
MEAN CORPUSCULAR HEMOGLOBIN CONC: 34.7 g/dL (ref 31.0–37.0)
MEAN CORPUSCULAR HEMOGLOBIN: 33 pg (ref 26.0–34.0)
MEAN CORPUSCULAR VOLUME: 95.3 fL (ref 80.0–100.0)
MEAN PLATELET VOLUME: 8.4 fL (ref 7.0–10.0)
MONOCYTES ABSOLUTE COUNT: 0.5 10*9/L (ref 0.2–0.8)
MONOCYTES RELATIVE PERCENT: 4.4 %
NEUTROPHILS ABSOLUTE COUNT: 8.5 10*9/L — ABNORMAL HIGH (ref 2.0–7.5)
NEUTROPHILS RELATIVE PERCENT: 81.4 %
PLATELET COUNT: 326 10*9/L (ref 150–440)
RED BLOOD CELL COUNT: 4.02 10*12/L — ABNORMAL LOW (ref 4.50–5.90)
RED CELL DISTRIBUTION WIDTH: 13 % (ref 12.0–15.0)
WBC ADJUSTED: 10.4 10*9/L (ref 4.5–11.0)

## 2020-03-18 LAB — CREATININE, URINE: CREATININE, URINE: 27 mg/dL

## 2020-03-18 LAB — COMPREHENSIVE METABOLIC PANEL
ALBUMIN: 3.7 g/dL (ref 3.4–5.0)
ALKALINE PHOSPHATASE: 71 U/L (ref 46–116)
ALT (SGPT): 16 U/L (ref 10–49)
ANION GAP: 12 mmol/L (ref 5–14)
AST (SGOT): 25 U/L (ref <=34)
BILIRUBIN TOTAL: 0.6 mg/dL (ref 0.3–1.2)
BLOOD UREA NITROGEN: 11 mg/dL (ref 9–23)
BUN / CREAT RATIO: 14
CALCIUM: 9.6 mg/dL (ref 8.7–10.4)
CHLORIDE: 91 mmol/L — ABNORMAL LOW (ref 98–107)
CO2: 22 mmol/L (ref 20.0–31.0)
CREATININE: 0.77 mg/dL
EGFR CKD-EPI AA MALE: >90 mL/min/{1.73_m2} (ref >=60)
EGFR CKD-EPI NON-AA MALE: >90 mL/min/{1.73_m2} (ref >=60)
GLUCOSE RANDOM: 91 mg/dL (ref 70–179)
POTASSIUM: 4.6 mmol/L (ref 3.4–4.8)
PROTEIN TOTAL: 7.5 g/dL (ref 5.7–8.2)
SODIUM: 125 mmol/L — ABNORMAL LOW (ref 135–145)

## 2020-03-18 LAB — URINALYSIS
BACTERIA: NONE SEEN /HPF
BILIRUBIN UA: NEGATIVE
BLOOD UA: NEGATIVE
GLUCOSE UA: NEGATIVE
LEUKOCYTE ESTERASE UA: NEGATIVE
NITRITE UA: NEGATIVE
PH UA: 7 (ref 5.0–9.0)
PROTEIN UA: NEGATIVE
RBC UA: <1 /HPF (ref <=3)
SPECIFIC GRAVITY UA: 1.005 (ref 1.003–1.030)
SQUAMOUS EPITHELIAL: <1 /HPF (ref 0–5)
UROBILINOGEN UA: 0.2
WBC UA: <1 /HPF (ref <=2)

## 2020-03-18 LAB — TOXICOLOGY SCREEN, URINE
AMPHETAMINE SCREEN URINE: NEGATIVE
BARBITURATE SCREEN URINE: NEGATIVE
BENZODIAZEPINE SCREEN, URINE: NEGATIVE
BUPRENORPHINE, URINE SCREEN: NEGATIVE
CANNABINOID SCREEN URINE: NEGATIVE
COCAINE(METAB.)SCREEN, URINE: NEGATIVE
FENTANYL SCREEN, URINE: NEGATIVE
METHADONE SCREEN, URINE: NEGATIVE
OPIATE SCREEN URINE: NEGATIVE
OXYCODONE SCREEN URINE: NEGATIVE

## 2020-03-18 LAB — OSMOLALITY, SERUM: OSMOLALITY MEASURED: 266 mosm/kg — ABNORMAL LOW (ref 275–295)

## 2020-03-18 LAB — SODIUM, URINE, RANDOM: SODIUM URINE: 50 mmol/L

## 2020-03-18 LAB — OSMOLALITY, RANDOM URINE: OSMOLALITY URINE: 211 mosm/kg

## 2020-03-18 LAB — LITHIUM LEVEL: LITHIUM LEVEL: 0.6 mmol/L (ref 0.5–1.2)

## 2020-03-18 LAB — ETHANOL: ETHANOL: <10 mg/dL (ref <=10.0)

## 2020-03-18 MED ADMIN — LORazepam (ATIVAN) injection 4 mg: 4 mg | INTRAVENOUS | @ 23:00:00

## 2020-03-18 MED ADMIN — LORazepam (ATIVAN) injection 4 mg: 4 mg | INTRAVENOUS | @ 19:00:00

## 2020-03-18 MED ADMIN — lactated ringers bolus 1,000 mL: 1000 mL | INTRAVENOUS | @ 20:00:00 | Stop: 2020-03-18

## 2020-03-18 MED ADMIN — thiamine mononitrate (vit B1) tablet 500 mg: 500 mg | ORAL | @ 17:00:00 | Stop: 2020-03-18

## 2020-03-18 MED ADMIN — LORazepam (ATIVAN) injection 4 mg: 4 mg | INTRAVENOUS | @ 17:00:00

## 2020-03-18 MED ADMIN — LORazepam (ATIVAN) injection 4 mg: 4 mg | INTRAVENOUS | @ 21:00:00

## 2020-03-18 MED ADMIN — lactated ringers bolus 500 mL: 500 mL | INTRAVENOUS | @ 17:00:00 | Stop: 2020-03-18

## 2020-03-18 MED ADMIN — thiamine (B-1) 500 mg in sodium chloride (NS) 0.9 % 100 mL IVPB: 500 mg | INTRAVENOUS | @ 22:00:00

## 2020-03-18 MED ADMIN — folic acid (FOLVITE) tablet 1 mg: 1 mg | ORAL | @ 17:00:00 | Stop: 2020-03-18

## 2020-03-18 MED ADMIN — LORazepam (ATIVAN) injection 4 mg: 4 mg | INTRAVENOUS | @ 18:00:00

## 2020-03-18 NOTE — Unmapped (Signed)
Follow-up instructions:  -- Please get lithium level checked ASAP. If you can't get into a car to be transported, call 911.  -- Hold lithium until your level is back.  -- Please continue taking your medications as prescribed for your mental health.   -- Do not make changes to your medications, including taking more or less than prescribed, unless under the supervision of your physician. Be aware that some medications may make you feel worse if abruptly stopped  -- Please refrain from using illicit substances, as these can affect your mood and could cause anxiety or other concerning symptoms.   -- Seek further medical care for any increase in symptoms or new symptoms such as thoughts of wanting to hurt yourself or hurt others.     Contact info:  Life-threatening emergencies: call 911 or go to the nearest ER for medical or psychiatric attention.     Issues that need urgent attention but are not life threatening: call the clinic outpatient frontdesk at [phone] for assistance.     Non-urgent routine concerns, questions, and refill requests: please send me a MyChart message and I will get back to you within 2 business days.    Regarding appointments:  - If you need to cancel your appointment, we ask that you call [phone] at least 24 hours before your scheduled appointment.  - If for any reason you arrive 15 minutes later than your scheduled appointment time, you may not be seen and your visit may be rescheduled.  - Please remember that we will not automatically reschedule missed appointments.  - If you miss two (2) appointments without letting us know in advance, you will likely be referred to a provider in your community.  - We will do our best to be on time. Sometimes an emergency will arise that might cause your clinician to be late. We will try to inform you of this when you check in for your appointment. If you wait more than 15 minutes past your appointment time without such notice, please speak with the front desk staff.    In the event of bad weather, the clinic staff will attempt to contact you, should your appointment need to be rescheduled. Additionally, you can call the Patient Weather Line [phone] for system-wide clinic status    For more information and reminders regarding clinic policies (these were provided when you were admitted to the clinic), please ask the front desk.

## 2020-03-18 NOTE — Unmapped (Signed)
Account Number [id number]  Account Name [facility] Outpatient Psychiatry  Call Id [id number]  Call Start Time   03/16/2020 03:43:29 PM PT    (03/16/2020 06:43:29 PM ET)  Call End Time   03/16/2020 04:41:22 PM PT    (03/16/2020 07:41:22 PM ET)  Call Duration 00:57:52  Total Handle Time 00:58:57  Finalized Time   03/16/2020 04:42:27 PM PT    (03/16/2020 07:42:27 PM ET)  Finalized Call Taker Nicholas Rush, LPC, License Number: [id number], License State: [place]  Responsible Call Taker Nicholas Rush, [facility], License Number: [id number], License State: [place]  Level of Care Urgent  Call Type Counseling Request,On-call Contact  (Person Information)  Caller Nicholas Rush  Caller Alternate Name   Caller Primary Phone Mobile [phone]  Is it ok to leave a message : Yes  Is Caller the POC No  Person of Concern Nicholas Rush  Person of Concern Alternate Name   Person of Concern Primary Phone [phone]  Is it ok to leave a message : No  New Call  Insurance coverage?   Medicaid  -[facility]  -[place] Text   Is the [employer] or Person of Concern part of the [employer] program?   No  Service Request Type   For Another Adult  COVID  Is this call related to concerns about the coronavirus/COVID-19?   No  3Rd Party Information  Caller's relationship to that person Wife  Gender of that person   Male  -Date of birth? Birthdate: 1965-06-03  Age: 55  Insurance coverage?   Medicaid  Currently enrolled in services with [facility]?   Yes  Current therapist or case manager?   Yes. Provider name is (below):  Current therapist or case manager? Dr. Henreitta Leber  -3rd Party's Address: [address]  [place], [place]  [address]  Irena Cords States  Clinical Assessment 2.2  Primary Reason for Call   Medication  Assessed Initial Level of Distress   Mild  Overview Caller's husband is having side effects from a recent medication change.  Assessed Presentation   Anxious  Suicide Risk Assessment   Assessment indicates no risk related to suicide  Buffers and Resiliency Factors   Immediate Supports  Intentional Self-Injury Risk Assessment   Not Relevant  Threat of Violence Risk Assessment   Assessment indicates no risk related to harm to others  Substance Use Risk Assessment   Assessment indicates no risk related to substance use  Interpersonal Violence Risk Assessment   Assessment indicates no risk related to interpersonal violence  Abuse-related Risk Assessment   Not Relevant  Psychiatric Medication Assessment   Confirms taking listed medication(s) as prescribed *  Narrative Psychiatric Medication Assessment PSYCHIATRIC MEDICATION NAME(S): Lithium 300mg  XR BID DESCRIBE CONCERNS/RISK, IF ANY: Depakote (weening off over three/four days) to Lithium (weds) while at hospital - hands trembling, brain fog, confusion, doesn't remember why he uses a walker, why he was at the hospital history of seizures due to sodium levels discharged from hospital yesterday  Interventions   Provided empathic listening and validation.  Focused on problem solving with caller.  Describe clinical justification and rationale   Assessed Concluding Level of Distress   Mild  Level of Care (LOC)   Urgent  Is this call related to concerns about the coronavirus/COVID-19?   No  Pick one outcome from the drop-down list. If more than one applies, choose the highest numbered item.   1. Contacted the On-Call.  Caller's Plan   Follow-up  Expectations   other (see below)  Follow-up Narrative I relayed the information to Nicholas Rush that recommendation was to get levels taken and for impaired mental status to take to closest ED for evaluation. Nicholas Rush stated she will monitor him and if he worsens will take him to the ED. She states he already has Rush appointment on Monday for the levels check.  Screened Call  On Call  On-Call   Date/Time Phone Number On-Call Type Who Was [facility] Memo  03/16/2020 04:07:29 PM [phone] Paged Psych on call [phone]   03/16/2020 04:16:21 PM [phone] Paged Psych Oncall [phone]   03/16/2020 04:26:12 PM [phone] Paged Psych Attending [phone]   03/16/2020 04:34:06 PM [phone] Responded Psych Attending [phone]   On-Call Notes Nicholas Rush responded - reviewed patient's record and stated Lithium could be causing tremors, recommended getting levels taken and evaluate at ER due to altered cognition.

## 2020-03-18 NOTE — Unmapped (Addendum)
Hx HTn, bipolar one.   Presented w/ AMS - recent psych admission, discharged 2 days prior. Changed from depakote to lithium. Had tele psych visit today, tremulous, confused, was told to present to ED.     Has never withdrawn previously.     BIB EMS from home   Has been showing signs of withdrawal in ED - 4 mg ativan x3     Don't restart depakote or lithium....keep zyprexa    Zyrexa 2.5 q6 PRN or haldol 2mg  iv for agitation    ........................................Marland Kitchen  Nicholas Rush is a 55 y.o. male with a past medical history significant for  Bipolar Disorder type 1, alcohol use disorder with hx of complicated withdrawal, HTN, psoriasis on adalimumab, and chronic hyponatermia who presents with altered mental status in the setting of hyponatremia, now improving.      # Encephalopathy, improving  # hx Seizures  Ddx for AMS included medication side effect vs hyponatremia.  Patient recently was changed from Depakote to lithium last week.  Has a history of hyponatremia and on home salt tabs.  Discussed with psych who state possibility that these medications were causing SIADH and salt tabs not replacing as needed.  Held lithium during hospitalization and hyponatremia improved along with mental status.  Was evaluated by psych and recommend***       # Acute on Chronic Hyponatremia  # Hx of SIADH   s/p switch from Depakote to lithium (which can also cause SIADH). However hyponatremia predates starting Depakote too. Recent admission 2/4 for hyponatremia with seizures, was discharged on salt tabs 1 g TID with meals, Fluid restriction 1.5 L/day. Hyponatremia corrected to baseline of 130 with the 1.5L received in the ED, so likely component of acute hypovolemic hyponatremia.  Currently on a normal diet with improvement in sodium, therefore will continue to hold home salt tabs.  -  Na at baseline, can check daily w/BMP  -  If unable to maintain Na, can consider restart salt tablets 1g with meals     # Bipolar 1 Disorder vs other  recent hospitalization with Psychiatry for transition from Depakote to Lithium given hyponatremia. Has had ~5 psych hospitalizations since November. 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  Held lithium while hospitalized.  Psych recommended not to restart home lithium and to continue Zyprexa for medication management.***       #HTN: amlodipine 10 mg daily     #Psoriasis - psoriatic arthritis: hold home Humera      # Tobacco use disorder: Patient currently smoking 1 PPD.   - Tobacco cessation consult  - Nicotine replacement therapy with patch, gum, lozenge PRN

## 2020-03-18 NOTE — Unmapped (Signed)
 Family Medicine Inpatient Service  History and Physical Note    Team: [facility] (pgr [phone])    PCP: Fabio Pierce, MD  Date of Admission: March 18, 2020  Code Status: full code presumed  Emergency Contact: presumably wife    ASSESSMENT / PLAN:   Nicholas Rush is a 55 y.o. male with a past medical history significant for Bipolar Disorder type 1, alcohol use disorder with hx of complicated withdrawal, HTN, psoriasis on adalimumab, and chronic hyponatermia who presents with altered mental status in the setting of hyponatremia, alcohol withdrawal.     # Encephalopathy - Alcohol Withdrawal - hx Seizures:  Patient recently transitioned from Depakote (valproate) to Lithium on 2/22, and presented to ED for AMS given concerns from psychiatrist for lithium toxicity, and was noted to have fasciculations of the tongue in the ED, however normal lithium levels. History of alcohol use disorder, including complicated withdrawals with seizures and DTs. Endorsed heavy alcohol use from time of discharge from Psych on 2/25 to last drink the night of 2/27 around midnight.  Undetectable ethanol levels, urine toxicology unremarkable. Initial CIWA 20 with rapid increase to 43, responded well to ativan in the ED. 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 Possible but less likely Wernicke's or osmotic demyelinating syndrome (hx of abrupt sodium increase with seizure during last hospitalization on 02/23/20, seizure determined to be not related to alcohol withdrawal and occurred after improvement in sodium, so of unclear etiology). Unusual to present in severe withdrawals within 24 hours of last drink, but does have hx of severe withdrawals and responded well to ativan so this is most likely etiology. Less likely related to lithium given normal level in ED, but this is most recent medication change. Less likely psychosis given waxing and waning more consistent with delirium, and recently psychiatrically relatively stable on 2/25 at time of discharge. HIV and syphilis checked in 10/21, TSH wnl. Patient unable to provide further hx or full neurological exam at time of admission due to receiving a dose of ativan immediately prior to EMS transport and a second dose during transport of unknown quantity.   - Hold lithium and consult psychiatry  - CIWA protocol with scheduled ativan 4mg  IV q4hr (hold until awake) + symptom triggered  - consider MRI brain if not returning to neurological baseline (to r/o ODS)  - hyponatremia now corrected as below  - consider restarting Naltrexone 50mg  daily once tolerating PO  - SW consult for AUD    # Acute on Chronic Hyponatremia: Na of 125 on presentation, Serum osms 266. Baseline ~130, most recent Na 131 on 2/25. Hx of SIADH (previously thought to be secondary to Depakote vs chronic alcohol use vs malignancy (+iFOBT), now s/p switch from Depakote to lithium (which can also cause SIADH). However hyponatremia predates starting Depakote too. Recent admission 2/4 for hyponatremia with seizures, was discharged on salt tabs 1 g TID with meals, Fluid restriction 1.5 L/day. Hyponatremia corrected to baseline of 130 with the 1.5L received in the ED, so likely component of acute hypovolemic hyponatremia in setting of binge. UA obtained after fluid resuscitation so less useful in this scenario. Will need workup for malignancy outpatient.  -  Na at baseline, can check daily w/BMP  -  Fluid restriction of 1.5L/day  -  If unable to maintain Na, can consider restart salt tablets 1g with meals  -  Consider Nephrology re-consult if condition worsens    #  Bipolar 1 Disorder vs other: recent hospitalization with Psychiatry for transition from Depakote to Lithium given hyponatremia. Has had ~5 psych hospitalizations since November. 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  -  Hold lithium until able to discuss with psych  -  Continue Zyprexa 20mg  at bedtime  - consult psych  - gabapentin qhs    #HTN:  Discontinue lisinopril 40mg  daily, start amlodipine    #Psoriasis??-??psoriatic arthritis: hold home Humera     # Tobacco use disorder: Patient currently smoking 1 PPD.   - Tobacco cessation consult  - Nicotine replacement therapy with patch, gum, lozenge PRN    # FEN/GI:  - IVF None -- consider IVF if not taking PO   - Check electrolytes as indicated, replete as needed.  - Diet Regular with 1.5L fluid restriction    # PPX:   - DVT: SQ Lovenox    # Dispo: Step down  [ ]  Anticipated Discharge Location: Inpatient psychiatric unit  [ ]  PT/OT/DME: No needs anticipated  [ ]  CM/SW needs: Substance use disorder resources  [ ]  Meds/Rx:  Not yet prescribed. No special med needs  [ ]  Teaching: None anticipated  [ ]  Follow up appt: Appointment needed  [ ]  Excuse letter: Needed  [ ]  Transport: Zenaida Niece Needed    This patient warrants inpatient care because of their severity of illness and the intensity of service that can only be performed in a hospital setting. This patient is expected to be hospitalized for greater than two midnights in the hospital because of intensity of services including IV therapies for severe alcohol withdrawal with hx of seizures requiring Stepdown level care. (if patient is admitted as Obs status you can delete this sentence, if you cannot justify this sentence then consider making them obs status)    HISTORY OF PRESENT ILLNESS:  Nicholas Rush is a 55 y.o. male who presents with increased confusion. Unable to obtain HPI from patient as he had received a second close interval dose of ativan while in transit with EMS and was obtunded at time of arrival, so the following is gathered from ED documentation and chart review.   He was discharged from a psychiatric hospital on 2/25 and went home on lithium. Proceeded to start drinking again, and endorsed ~10 drinks per day to ED provider. Wife noted increased confusion on 2/27, and held his morning lithium dose. At his telehealth appointment with psychiatry on 2/28 he was noted to be oriented x4 but endorsed anxiety, and was noted to have hand tremors bilaterally.    ED Course: On arrival to the ED, HR 115, RR, BP 158/101. On exam tongue fasciculations, tremulous, oriented to person and place and knows the year, but confused about reason for presentation, limited ability to discuss recent events. Subsequently noted to have CIWAs in the 40s and to be actively hallucinating, was treated with IV ativan with improvement in CIWA scores but ongoing hallucinations, waxing and waning mental status consistent with delirium. Workup notable for sodium of 125, TSH wnl, lithium level 0.6, alcohol level of zero. UA collected after fluids, ketones, osmolality  211, sodium 50.     COVID-19 Universal Testing on Admission (if asymptomatic, does not need repeat if done with the past 7 days): Asymptomatic & Pending    COVID-19 Vaccine: Primary series completed, will offer booster when awake    PAST MEDICAL / SURGICAL HX:  Past Medical History:   Diagnosis Date   ??? Anxiety    ???  Bipolar 1 disorder (CMS-HCC)    ??? Depressive disorder    ??? Erectile dysfunction    ??? Esophageal reflux    ??? History of pyloric stenosis    ??? Hypertension    ??? Insomnia    ??? Psoriasis     Dermatologist - Dr. Deloria Lair in [facility]   ??? Seizure (CMS-HCC) 02/20/2019   ??? Tobacco use     1ppd     Past Surgical History:   Procedure Laterality Date   ??? PYLOROMYOTOMY     ??? WISDOM TOOTH EXTRACTION          FAMILY HX:   Family History   Problem Relation Age of Onset   ??? Bipolar disorder Mother    ??? Depression Sister    ??? Alcohol abuse Sister    ??? Alcohol abuse Maternal Aunt    ??? Melanoma Neg Hx    ??? Basal cell carcinoma Neg Hx    ??? Squamous cell carcinoma Neg Hx        SOCIAL HX:   Social History     Socioeconomic History   ??? Marital status: Married     Spouse name: Not on file   ??? Number of children: Not on file   ??? Years of education: Not on file   ??? Highest education level: Not on file   Occupational History   ??? Not on file   Tobacco Use   ??? Smoking status: Current Every Day Smoker     Packs/day: 1.00     Years: 30.00     Pack years: 30.00     Types: Cigarettes   ??? Smokeless tobacco: Never Used   Vaping Use   ??? Vaping Use: Never used   Substance and Sexual Activity   ??? Alcohol use: Yes     Comment: 3 drinks a day.   ??? Drug use: Yes     Types: Marijuana   ??? Sexual activity: Not on file   Other Topics Concern   ??? Do you use sunscreen? Yes   ??? Tanning bed use? Yes   ??? Are you easily burned? Yes   ??? Excessive sun exposure? No   ??? Blistering sunburns? Yes   Social History Narrative    Updated 01/03/2020        Lives in [place] with wife and two children.  Currently unemployed had Worked at [employer].  Current smoker.History of excessive ETOh abuse has been sober since September 2021.Marland Kitchen           PSYCHIATRIC HX:     -Current provider(s):  [place] Dr. Roxanne Gates     -Suicide attempts/SIB: NO    -Psych Hospitalizations:  2014 for depression at [facility]    -Med compliance hx: Poor,      -Fa hx suicide: YES, sister attempted         SUBSTANCE ABUSE HX:     -Current using substance: sig hx of alcohol use (last drink Sept 2021) and was hospitalized for withdrawal with DT    -Hx w/d sxs: YES    -Sz Hx: YES,    -DT Hx:YES, Sept 2021        SOCIAL HX:    -Current living environment: lives in [place] with wife and 2 childrne     -Current support: wife, father     -Violence (perp): verbal, has been kicking walls     -Access to Firearms: NO        -Guardian: NO        -  Trauma: NO             Social Determinants of Health     Financial Resource Strain: Low Risk    ??? Difficulty of Paying Living Expenses: Not hard at all   Food Insecurity: No Food Insecurity   ??? Worried About Programme researcher, broadcasting/film/video in the Last Year: Never true   ??? Ran Out of Food in the Last Year: Never true   Transportation Needs: No Transportation Needs   ??? Lack of Transportation (Medical): No   ??? Lack of Transportation (Non-Medical): No   Physical Activity: Not on file   Stress: Not on file   Social Connections: Not on file       MEDICATIONS / ALLERGIES:  Medications Prior to Admission   Medication Sig Dispense Refill Last Dose   ??? acetaminophen (TYLENOL) 500 MG tablet Take 500 mg by mouth every six (6) hours as needed for pain.      ??? ADALIMUMAB SYRINGE CITRATE FREE 40 MG/0.4 ML Inject the contents of 1 syringe (40 mg) under the skin every 14 days 4 each 1    ??? calcium carbonate (TUMS) 200 mg calcium (500 mg) chewable tablet Chew 2 tablets daily as needed for heartburn.      ??? calcium carbonate/vitamin D3 (CALCIUM 500 + D ORAL) Take 1 tablet by mouth daily.      ??? cetirizine (ZYRTEC) 10 MG tablet Take 10 mg by mouth daily.      ??? fluticasone propionate (FLONASE) 50 mcg/actuation nasal spray 2 sprays into each nostril daily. 16 g 12    ??? folic acid (FOLVITE) 1 MG tablet Take 1 tablet (1 mg total) by mouth daily. 30 tablet 11    ??? gabapentin (NEURONTIN) 100 MG capsule Take 2 capsules (200 mg total) by mouth Three (3) times a day. 180 capsule 0    ??? lisinopriL (PRINIVIL,ZESTRIL) 20 MG tablet Take 1 tablet (20 mg total) by mouth daily. 30 tablet 2    ??? lithium (LITHOBID) 300 MG ER tablet Take 1 tablet (300 mg total) by mouth Two (2) times a day. Avoid medications like ibuprofen, naproxen while taking. 60 tablet 0    ??? magnesium oxide (MAG-OX) 400 mg (241.3 mg elemental magnesium) tablet Take 2 tablets (800 mg total) by mouth daily. 120 tablet 0    ??? multivitamin,tx-iron-minerals (THERA-M) Tab Take 1 tablet by mouth daily. 30 tablet 0    ??? naltrexone (DEPADE) 50 mg tablet Take 1 tablet (50 mg total) by mouth daily. 30 tablet 0    ??? nicotine (NICODERM CQ) 21 mg/24 hr patch Place 1 patch on the skin daily. 28 patch 0    ??? nicotine polacrilex (NICORETTE) 4 MG gum Apply 1 each (4 mg total) to cheek every hour as needed for smoking cessation. 110 each 0    ??? OLANZapine (ZYPREXA) 20 MG tablet Take 1 tablet (20 mg total) by mouth nightly. 30 tablet 0    ??? omeprazole (PRILOSEC) 20 MG capsule TAKE 1 CAPSULE(20 MG) BY MOUTH EVERY DAY 90 capsule 0    ??? sennosides (SENNA LAX ORAL) Take 1 tablet by mouth daily as needed (constipation).      ??? sodium chloride 1 gram tablet Take 1 tablet (1 g total) by mouth Three (3) times a day with a meal. 100 tablet 2    ??? thiamine (B-1) 100 MG tablet Take 1 tablet (100 mg total) by mouth daily. 110 tablet 0  Allergies   Allergen Reactions   ??? Tramadol      Risk of seizures   ??? Hydroxyzine Itching     Sedation.  Ineffective for reducing anxiety.       IMMUNIZATIONS:   Immunization History   Administered Date(s) Administered   ??? COVID-19 VACCINE,MRNA(MODERNA)(PF)(IM) 04/26/2019, 06/08/2019   ??? INFLUENZA TIV (TRI) 81MO+ W/ PRESERV (IM) 11/17/2007   ??? INFLUENZA TIV (TRI) PF (IM) 02/20/2009   ??? Influenza Vaccine Quad (IIV4 PF) 85mo+ injectable 09/27/2018, 10/22/2019   ??? PNEUMOCOCCAL POLYSACCHARIDE 23 08/16/2014   ??? TdaP 02/20/2009       REVIEW OF SYSTEMS:  Unable to obtain complete review of systems due to patient's present condition.    PHYSICAL EXAM:    Initial ED Vitals:   ED Triage Vitals [03/18/20 1104]   Enc Vitals Group      BP 158/101      Heart Rate 115      SpO2 Pulse 116      Resp 18      Temp 36.8 ??C (98.3 ??F)      Temp Source Tympanic      SpO2 95 %      Weight       Height       Head Circumference       Peak Flow       Pain Score       Pain Loc       Pain Edu?       Excl. in GC?        Recent Vitals:  Vitals:    03/18/20 1447   BP: 135/94   Pulse: 104   Resp: 17   Temp: 36.2 ??C (97.2 ??F)   SpO2: 92%       GEN: snoring, briefly arousable to sternal rub and able to follow simple commands intermittently  Eyes: PER. No scleral icterus. Conjunctiva non-erythematous.   HEENT: NCAT, MM mildly tacky..  Neck: Supple.  Lymphadenopathy: No cervical or supraclavicular LAD.  CV: Regular rate and rhythm. No murmurs/rubs/gallops. No cyanosis or clubbing. Cap Refill < 2 secs  Pulm: Loud snoring, some sleep apnea which improved with repositioning.   Abd: Flat.  Soft. Normoactive bowel sounds.    Neuro: Asleep. Arousable to sternal rub, then would squeeze hands with minimal effort, wiggle toes, did not follow any other commands. Able to say name and that he is in [place].    Ext: No peripheral edema.  Palpable distal pulses.  Skin: No rashes or skin lesions.  Sacrum examined and intact      LABS/ STUDIES:  All imaging, laboratory studies, and other pertinent tests including electrocardiography were reviewed prior to admission and are summarized within the assessment and plan.       Katherina Right, MD PGY2
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
## 2020-03-18 NOTE — Unmapped (Signed)
BIB EMS from home c/o AMS. Switched from Depakote to lithium 2/22 during hospitalization here for hyponatremia. Dc'd home Friday, AMS on Saturday resolved after skipping lithium. Given lithium at 2300 last night, GCS 13. VSS

## 2020-03-18 NOTE — Unmapped (Signed)
[facility]  Emergency Department Provider Note    This documentation was generated through the use of dictation and/or voice recognition software, and as such, may contain spelling or other transcription errors. Chart creation errors have been sought, but may not always be located and such creation errors, especially pronoun confusion, do NOT reflect on the standard of medical care. Any questions regarding the content of this documentation should be directed to the individual who electronically signed.    ED Clinical Impression     Final diagnoses:   Altered mental status, unspecified altered mental status type (Primary)   Hyponatremia   History of alcohol abuse       Initial Impression, ED Course, Assessment and Plan     Impression: 55 y.o. 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    BP 138/99  - Pulse 95  - Temp 36.2 ??C (97.2 ??F)  - Resp 18  - SpO2 98%     Upon arrival, patient is tachycardic with HR 115 and mildly hypertensive with SBP 160.  Otherwise, afebrile, no tachypnea, satting 95% on RA.  Patient appears older than stated age, but is overall well-appearing, nontoxic. No clinical signs suggesting severe systemic illness. Clinical exam documented below.    Interesting case, ultimately likely multifactorial and at this point appears most consistent with encephalopathy and delirium.  Initially, patient appeared clinically to be in withdrawal, most likely alcohol given his reported severe use.  However, admittedly the timeline does not appear to check out given that he was only discharged from the hospital less than 72 hours ago.  His symptoms did improved after being placed on CIWA protocol, which he was scoring very high for initial primarily for AMS in the 30s, and after receiving at least 8 mg of Ativan his tongue fasciculations and bilateral hand tremors appeared mildly improved.  Patient was also found to be hyponatremic again today, sodium level 125, this is worse from discharge in which his sodium level was 130.  His lithium level is WNL at 0.6, therefore do not believe this is playing a large clinical role.  Otherwise, CT head negative for acute pathology, no leukocytosis or leukopenia, H&H and platelet count stable, no major electrolyte abnormalities.  He received thiamine, folic acid, and 1.5L IVF with improvement in his tachycardia, most recently HR in the 90s.  This patient will certainly require admission for further testing, evaluation, and observation.  MAO paged.    Disposition: Admit    Additional Medical Decision Making     I have reviewed the vital signs and the nursing notes. Labs and radiology results that were available during my care of the patient were independently reviewed by me and considered in my medical decision making.     I staffed the case with the ED attending, Dr. Vicente Males    I reviewed the patient's prior medical records.   I independently visualized the radiology images.   I independently visualized the EKG tracing.       History     Chief Complaint  Altered Mental Status      HPI   Nicholas Rush is a 55 y.o. 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    History is obtained exclusively from EMS and chart review.  EMS states they were called to the house due to waxing and waning mental status since recent hospital discharge on 03/15/2020.  He has reportedly been experiencing intermittent confusion, as well as intermittent tremors.  Wife supposedly held the patient's lithium dose on Saturday, and subjectively noticed an improvement in his symptoms.  The last dose of lithium that the patient received was last night at 1130.  EMS states that the family had a telephone appointment with [facility] psychiatry team today, who recommended presentation to the ED to get his lithium levels checked.  No additional details none at this time.    When speaking directly to the patient, he remembers being discharged from the hospital recently.  He states he is not sure what he has been doing since discharge, but he does endorse alcohol use.  He reports drinking 5???10 drinks of bourbon last night.  He states that he is here for my lithium levels to be checked because my wife told me to.  He is able to tell me that he does not feel right but is unable to articulate any further details.      Past Medical History:   Diagnosis Date   ??? Anxiety    ??? Bipolar 1 disorder (CMS-HCC)    ??? Depressive disorder    ??? Erectile dysfunction    ??? Esophageal reflux    ??? History of pyloric stenosis    ??? Hypertension    ??? Insomnia    ??? Psoriasis     Dermatologist - Dr. Deloria Lair [facility]   ??? Seizure (CMS-HCC) 02/20/2019   ??? Tobacco use     1ppd       Patient Active Problem List   Diagnosis   ??? Bipolar 1 and/or TBI and/or unspecified mood disorder   ??? Alcohol withdrawal syndrome, with delirium (CMS-HCC)   ??? Alcoholism (CMS-HCC)   ??? Anxiety state   ??? Essential hypertension   ??? Insomnia   ??? Psoriasis   ??? Alcohol abuse, episodic   ??? Psoriatic arthritis (CMS-HCC)   ??? Positive colorectal cancer screening using DNA-based stool test   ??? Diarrhea of presumed infectious origin   ??? Alcohol withdrawal seizure (CMS-HCC)   ??? Recurrent falls   ??? Pancytopenia (CMS-HCC)   ??? Thrombocytopenia (CMS-HCC)   ??? Macrocytic anemia   ??? Hyponatremia   ??? Hypomagnesemia ??? Bizarre behavior   ??? Tobacco use disorder       Past Surgical History:   Procedure Laterality Date   ??? PYLOROMYOTOMY     ??? WISDOM TOOTH EXTRACTION           Current Facility-Administered Medications:   ???  LORazepam (ATIVAN) injection 2 mg, 2 mg, Intravenous, Q4H PRN **OR** LORazepam (ATIVAN) injection 4 mg, 4 mg, Intravenous, Q4H PRN, 4 mg at 03/18/20 1224 **OR** LORazepam (ATIVAN) injection 4 mg, 4 mg, Intravenous, Q30 Min PRN, 4 mg at 03/18/20 1541 **OR** LORazepam (ATIVAN) injection 4 mg, 4 mg, Intravenous, Q2H PRN, Dorian Heckle, MD  ???  LORazepam (ATIVAN) injection 4 mg, 4 mg, Intravenous, Q4H SCH, Vergia Alberts, MD, 4 mg  at 03/18/20 1755  ???  thiamine (B-1) 500 mg in sodium chloride (NS) 0.9 % 100 mL IVPB, 500 mg, Intravenous, TID, Vergia Alberts, MD, Stopped at 03/18/20 1750    Current Outpatient Medications:   ???  acetaminophen (TYLENOL) 500 MG tablet, Take 500 mg by mouth every six (6) hours as needed for pain., Disp: , Rfl:   ???  ADALIMUMAB SYRINGE CITRATE FREE 40 MG/0.4 ML, Inject the contents of 1 syringe (40 mg) under the skin every 14 days, Disp: 4 each, Rfl: 1  ???  calcium carbonate (TUMS) 200 mg calcium (500 mg) chewable tablet, Chew 2 tablets daily as needed for heartburn., Disp: , Rfl:   ???  calcium carbonate/vitamin D3 (CALCIUM 500 + D ORAL), Take 1 tablet by mouth daily., Disp: , Rfl:   ???  cetirizine (ZYRTEC) 10 MG tablet, Take 10 mg by mouth daily., Disp: , Rfl:   ???  fluticasone propionate (FLONASE) 50 mcg/actuation nasal spray, 2 sprays into each nostril daily., Disp: 16 g, Rfl: 12  ???  folic acid (FOLVITE) 1 MG tablet, Take 1 tablet (1 mg total) by mouth daily., Disp: 30 tablet, Rfl: 11  ???  gabapentin (NEURONTIN) 100 MG capsule, Take 2 capsules (200 mg total) by mouth Three (3) times a day., Disp: 180 capsule, Rfl: 0  ???  lisinopriL (PRINIVIL,ZESTRIL) 20 MG tablet, Take 1 tablet (20 mg total) by mouth daily., Disp: 30 tablet, Rfl: 2  ???  lithium (LITHOBID) 300 MG ER tablet, Take 1 tablet (300 mg total) by mouth Two (2) times a day. Avoid medications like ibuprofen, naproxen while taking., Disp: 60 tablet, Rfl: 0  ???  magnesium oxide (MAG-OX) 400 mg (241.3 mg elemental magnesium) tablet, Take 2 tablets (800 mg total) by mouth daily., Disp: 120 tablet, Rfl: 0  ???  multivitamin,tx-iron-minerals (THERA-M) Tab, Take 1 tablet by mouth daily., Disp: 30 tablet, Rfl: 0  ???  naltrexone (DEPADE) 50 mg tablet, Take 1 tablet (50 mg total) by mouth daily., Disp: 30 tablet, Rfl: 0  ???  nicotine (NICODERM CQ) 21 mg/24 hr patch, Place 1 patch on the skin daily., Disp: 28 patch, Rfl: 0  ???  nicotine polacrilex (NICORETTE) 4 MG gum, Apply 1 each (4 mg total) to cheek every hour as needed for smoking cessation., Disp: 110 each, Rfl: 0  ???  OLANZapine (ZYPREXA) 20 MG tablet, Take 1 tablet (20 mg total) by mouth nightly., Disp: 30 tablet, Rfl: 0  ???  omeprazole (PRILOSEC) 20 MG capsule, TAKE 1 CAPSULE(20 MG) BY MOUTH EVERY DAY, Disp: 90 capsule, Rfl: 0  ???  sennosides (SENNA LAX ORAL), Take 1 tablet by mouth daily as needed (constipation)., Disp: , Rfl:   ???  sodium chloride 1 gram tablet, Take 1 tablet (1 g total) by mouth Three (3) times a day with a meal., Disp: 100 tablet, Rfl: 2  ???  thiamine (B-1) 100 MG tablet, Take 1 tablet (100 mg total) by mouth daily., Disp: 110 tablet, Rfl: 0    Allergies  Tramadol and Hydroxyzine    Family History   Problem Relation Age of Onset   ??? Bipolar disorder Mother    ??? Depression Sister    ??? Alcohol abuse Sister    ??? Alcohol abuse Maternal Aunt    ??? Melanoma Neg Hx    ??? Basal cell carcinoma Neg Hx    ??? Squamous cell carcinoma Neg Hx        Social History  Social History  Tobacco Use   ??? Smoking status: Current Every Day Smoker     Packs/day: 1.00     Years: 30.00     Pack years: 30.00     Types: Cigarettes   ??? Smokeless tobacco: Never Used   Vaping Use   ??? Vaping Use: Never used   Substance Use Topics   ??? Alcohol use: Yes     Comment: 3 drinks a day.   ??? Drug use: Yes     Types: Marijuana Review of Systems  Unable to assess in the setting of the patient's AMS    Physical Exam     ED Triage Vitals [03/18/20 1104]   Enc Vitals Group      BP 158/101      Heart Rate 115      SpO2 Pulse 116      Resp 18      Temp 36.8 ??C (98.3 ??F)      Temp Source Tympanic      SpO2 95 %       CONSTITUTIONAL: Well-nourished male who appears older than stated age sitting on the stretcher, in NAD  HEAD: Normocephalic. Atraumatic.  EYES: Conjunctivae clear. Sclera non-icteric.  ENT: Airway patent. MMM  NECK: Supple without meningismus. No significant cervical LAD.   CARD: + Tachycardic.  Regular rhythm. S1 and S2 appreciated. Normal and symmetric distal pulses present in all extremities. Cap refill < 2 secs.   RESP: No tachypnea. No increased WOB. No audible stridor or stertor. LCTAB. No wheezes or rales. No cough noted during my evaluation.   ABD/GI: Soft. Non-distended. Non-tender to palpation. No rebound. No guarding.  BACK:  The back appears normal  EXT: Normal spontaneous ROM in all joints. No effusions. No edema. Non-tender to palpation.   SKIN: Warm. Dry. Normal color for age and race. No acute lesions or obvious rash noted.  NEURO: A&O to person and place, states the year is 22. States Trump is president. PEERLA at 3 mm. EOMI. no nystagmus.  Tongue midline. Normal facial sensation. Face is symmetric.  5/5 strength in the upper and lower extremities both proximally and distally (patient initially had difficulty completing this with his LUE, but was able to with some encouragement). Normal sensation intact in the upper and lower extremities both proximally and distally.  Questionable dysmetria on L sided finger-to-nose (patient initially had difficulty completing this with his LUE, but was able to with some encouragement). +Diffuse tremor, most prominent in the bilateral hands, as well as tongue fasciculations.  Normal tone.  No clonus.  Speech and language are normal.  PSYCH: Unable to assess    Results Labs Reviewed   COMPREHENSIVE METABOLIC PANEL - Abnormal; Notable for the following components:       Result Value    Sodium 125 (*)     Chloride 91 (*)     All other components within normal limits   CBC W/ AUTO DIFF - Abnormal; Notable for the following components:    RBC 4.02 (*)     HGB 13.3 (*)     HCT 38.4 (*)     Neutrophil Left Shift 1+ (*)     Absolute Neutrophils 8.5 (*)     Absolute Lymphocytes 1.2 (*)     All other components within normal limits    Narrative:     Please use the Absolute Differential for reference ranges.    ETHANOL - Normal    Narrative:     Testing for medical purposes  only.   LITHIUM LEVEL - Normal   TOXICOLOGY SCREEN, URINE - Normal    Narrative:     The positive screening test(s)detected drugs belonging to the indicated class or another similar substance. Confirmation of positive screening results is available on request. These results should be used only for medical (i.e., treatment) purposes.   CBC W/ DIFFERENTIAL    Narrative:     The following orders were created for panel order CBC w/ Differential.  Procedure                               Abnormality         Status                     ---------                               -----------         ------                     CBC w/ Differential[[id number]]         Abnormal            Final result                 Please view results for these tests on the individual orders.   VOLATILE SCREEN, URINE   ETHYL GLUCURONIDE SCREEN, URINE       CT Head Wo Contrast   Final Result      No acute appearing intracranial abnormality.          Procedures     N/A     Dorian Heckle, MD  Resident  03/18/20 [phone]

## 2020-03-19 LAB — BASIC METABOLIC PANEL
ANION GAP: 6 mmol/L (ref 5–14)
ANION GAP: 7 mmol/L (ref 5–14)
BLOOD UREA NITROGEN: 11 mg/dL (ref 9–23)
BLOOD UREA NITROGEN: 11 mg/dL (ref 9–23)
BUN / CREAT RATIO: 14
BUN / CREAT RATIO: 15
CALCIUM: 9.1 mg/dL (ref 8.7–10.4)
CALCIUM: 9.7 mg/dL (ref 8.7–10.4)
CHLORIDE: 98 mmol/L (ref 98–107)
CHLORIDE: 99 mmol/L (ref 98–107)
CO2: 26 mmol/L (ref 20.0–31.0)
CO2: 26.1 mmol/L (ref 20.0–31.0)
CREATININE: 0.74 mg/dL
CREATININE: 0.78 mg/dL
EGFR CKD-EPI AA MALE: >90 mL/min/{1.73_m2} (ref >=60)
EGFR CKD-EPI AA MALE: >90 mL/min/{1.73_m2} (ref >=60)
EGFR CKD-EPI NON-AA MALE: >90 mL/min/{1.73_m2} (ref >=60)
EGFR CKD-EPI NON-AA MALE: >90 mL/min/{1.73_m2} (ref >=60)
GLUCOSE RANDOM: 89 mg/dL (ref 70–179)
GLUCOSE RANDOM: 96 mg/dL (ref 70–179)
POTASSIUM: 3.7 mmol/L (ref 3.4–4.8)
POTASSIUM: 3.7 mmol/L (ref 3.4–4.8)
SODIUM: 130 mmol/L — ABNORMAL LOW (ref 135–145)
SODIUM: 132 mmol/L — ABNORMAL LOW (ref 135–145)

## 2020-03-19 LAB — TSH: THYROID STIMULATING HORMONE: 1.643 u[IU]/mL (ref 0.550–4.780)

## 2020-03-19 MED ADMIN — enoxaparin (LOVENOX) syringe 40 mg: 40 mg | SUBCUTANEOUS | @ 03:00:00

## 2020-03-19 MED ADMIN — fluticasone propionate (FLONASE) 50 mcg/actuation nasal spray 2 spray: 2 | NASAL | @ 15:00:00

## 2020-03-19 MED ADMIN — LORazepam (ATIVAN) injection 4 mg: 4 mg | INTRAVENOUS | @ 03:00:00

## 2020-03-19 MED ADMIN — nicotine (NICODERM CQ) 21 mg/24 hr patch 1 patch: 1 | TRANSDERMAL | @ 15:00:00

## 2020-03-19 MED ADMIN — LORazepam (ATIVAN) injection 2 mg: 2 mg | INTRAVENOUS | @ 01:00:00

## 2020-03-19 MED ADMIN — gabapentin (NEURONTIN) capsule 200 mg: 200 mg | ORAL | @ 03:00:00

## 2020-03-19 MED ADMIN — thiamine (B-1) 500 mg in sodium chloride (NS) 0.9 % 100 mL IVPB: 500 mg | INTRAVENOUS | @ 10:00:00 | Stop: 2020-03-22

## 2020-03-19 MED ADMIN — lactated Ringers infusion: 60 mL/h | INTRAVENOUS | @ 10:00:00 | Stop: 2020-03-19

## 2020-03-19 MED ADMIN — cetirizine (ZyrTEC) tablet 10 mg: 10 mg | ORAL | @ 15:00:00

## 2020-03-19 MED ADMIN — nicotine (NICODERM CQ) 21 mg/24 hr patch 1 patch: 1 | TRANSDERMAL | @ 03:00:00

## 2020-03-19 MED ADMIN — folic acid injection: 1 mg | INTRAVENOUS | @ 16:00:00 | Stop: 2020-03-22

## 2020-03-19 MED ADMIN — amLODIPine (NORVASC) tablet 10 mg: 10 mg | ORAL | @ 15:00:00

## 2020-03-19 MED ADMIN — OLANZapine (ZYPREXA) tablet 20 mg: 20 mg | ORAL | @ 03:00:00

## 2020-03-19 MED ADMIN — LORazepam (ATIVAN) injection 4 mg: 4 mg | INTRAVENOUS | @ 06:00:00 | Stop: 2020-03-19

## 2020-03-19 MED ADMIN — thiamine (B-1) 500 mg in sodium chloride (NS) 0.9 % 100 mL IVPB: 500 mg | INTRAVENOUS | @ 20:00:00 | Stop: 2020-03-22

## 2020-03-19 NOTE — Unmapped (Signed)
error 

## 2020-03-20 LAB — VOLATILE SCREEN, URINE
ACETONE URINE: NOT DETECTED mg/dL
ETHANOL URINE: NOT DETECTED mg/dL
ISOPROPANOL, URINE: NOT DETECTED mg/dL
METHANOL, URINE: NOT DETECTED mg/dL

## 2020-03-20 LAB — BASIC METABOLIC PANEL
ANION GAP: 10 mmol/L (ref 5–14)
BLOOD UREA NITROGEN: 15 mg/dL (ref 9–23)
BUN / CREAT RATIO: 19
CALCIUM: 9.3 mg/dL (ref 8.7–10.4)
CHLORIDE: 101 mmol/L (ref 98–107)
CO2: 25.4 mmol/L (ref 20.0–31.0)
CREATININE: 0.77 mg/dL
EGFR CKD-EPI AA MALE: >90 mL/min/{1.73_m2} (ref >=60)
EGFR CKD-EPI NON-AA MALE: >90 mL/min/{1.73_m2} (ref >=60)
GLUCOSE RANDOM: 122 mg/dL (ref 70–179)
POTASSIUM: 3.4 mmol/L (ref 3.4–4.8)
SODIUM: 136 mmol/L (ref 135–145)

## 2020-03-20 LAB — VITAMIN B12: VITAMIN B-12: 466 pg/mL (ref 211–911)

## 2020-03-20 MED ADMIN — folic acid (FOLVITE) tablet 1 mg: 1 mg | ORAL | @ 14:00:00

## 2020-03-20 MED ADMIN — nicotine (NICODERM CQ) 21 mg/24 hr patch 1 patch: 1 | TRANSDERMAL | @ 14:00:00

## 2020-03-20 MED ADMIN — cetirizine (ZyrTEC) tablet 10 mg: 10 mg | ORAL | @ 14:00:00

## 2020-03-20 MED ADMIN — enoxaparin (LOVENOX) syringe 40 mg: 40 mg | SUBCUTANEOUS | @ 02:00:00

## 2020-03-20 MED ADMIN — thiamine (B-1) 500 mg in sodium chloride (NS) 0.9 % 100 mL IVPB: 500 mg | INTRAVENOUS | @ 02:00:00 | Stop: 2020-03-22

## 2020-03-20 MED ADMIN — gabapentin (NEURONTIN) capsule 200 mg: 200 mg | ORAL | @ 02:00:00

## 2020-03-20 MED ADMIN — LORazepam (ATIVAN) tablet 4 mg: 4 mg | ORAL | @ 02:00:00

## 2020-03-20 MED ADMIN — LORazepam (ATIVAN) tablet 2 mg: 2 mg | ORAL

## 2020-03-20 MED ADMIN — amLODIPine (NORVASC) tablet 10 mg: 10 mg | ORAL | @ 14:00:00

## 2020-03-20 MED ADMIN — thiamine (B-1) 500 mg in sodium chloride (NS) 0.9 % 100 mL IVPB: 500 mg | INTRAVENOUS | @ 11:00:00 | Stop: 2020-03-20

## 2020-03-20 MED ADMIN — OLANZapine (ZYPREXA) tablet 20 mg: 20 mg | ORAL | @ 02:00:00

## 2020-03-20 MED ADMIN — fluticasone propionate (FLONASE) 50 mcg/actuation nasal spray 2 spray: 2 | NASAL | @ 14:00:00

## 2020-03-20 NOTE — Unmapped (Signed)
[facility]  Initial Psychiatry Telehealth Consult Note     Service Date: March 20, 2020  LOS:  LOS: 1 day      Assessment:   Nicholas Rush is a 55 y.o. 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  Psychiatry was consulted at the request of Soyla Murphy, MD with Family Medicine [facility]) for evaluation of AMS.    On evaluation, he demonstrates intact attention testing, without evidence of frank AMS. Based on his history, it is concerning overall for AMS secondary to lithium toxicity, though unclear if this was precipitated by something such as volume loss, concurrent medication use (NSAIDs, ACE inhibitors), or underlying sensitivity to lithium. While his lithium level was not elevated when checked, unclear if this was a true trough and may not clearly represent a true level. Given that his mental status improved with discontinuation of lithium, it points to a medication-induced encephalopathy. At present, he is not demonstrating any signs or symptoms of mania or psychosis, however, per collateral he has had waxing and waning mental status and they do not feel he is back at baseline currently. He has been doing well on Olanzapine 20 mg at bedtime thus far and it is reasonable to attempt monotherapy with Olanzapine given its use as a mood stabilizer and concerns for medication induced SIADH (with VPA) and recent lithium toxicity. He has largely stopped his ETOH use, which makes ongoing withdrawal less likely, but would still exercise caution as it pertains to psychotropic use.     For now, reasonable to continue Olanzapine and continuing to monitor mental status while admitted. Impression of drug-induced encephalopathy (likely lithium) in the context of diagnosis of bipolar disorder and historic AUD.     Please see below for detailed recommendations.    Diagnoses:   Active Hospital problems:  Active Problems:    * No active hospital problems. *       Problems edited/added by me:  Problem   Bipolar Disorder (Cms-Hcc)       Safety Risk Assessment:  A suicide and violence risk assessment was performed as part of this evaluation. Risk factors for self-harm/suicide: history of substance abuse, poor adherence to treatment  and past head injury.  Protective factors against self-harm/suicide:  lack of active SI, no history of previous suicide attempts , currently receiving mental health treatment, supportive family, minor children living at home, presence of an available support system and safe housing.  Risk factors for harm to others: past substance abuse.  Protective factors against harm to others: no active symptoms of psychosis, no active symptoms of mania, no previous acts of violence in current setting and connectedness to family.  While future psychiatric events cannot be accurately predicted, the patient is not currently at elevated acute risk, and is at elevated chronic risk of harm to self and is not currently at elevated acute risk, and is not at elevated chronic risk of harm to others.     Recommendations:   ##Safety:   -- No acute safety concerns.  Please see safety assessment for further discussion.    ##Medications:   -- Continue Olanzapine 20 mg at bedtime, would defer further consideration of mood stabilizer to the outpatient setting if feasible  -- Restart Naltrexone at 50 mg daily for  AUD  -- START Melatonin 3 mg q1800 for sleep and circadian rhythm regulation.    ##Further Work-up:   -- Would order B12 and folate labs  -- Continue to work-up and treat possible medical conditions that may be contributing to current presentation.     ##Medical Decision Making Capacity:   -- A formal capacity assessement was not performed as a part of this evaluation.  If specific capacity questions arise, please contact our team as below.     ##Behavioral - Environmental:   -- Although not currently delirious, the patient is at an elevated risk for developing delirium. Please utilize delirium prevention protocol.    ##Disposition:   -- There are no psychiatric contraindications to discharging this patient when medically appropriate.    Thank you for this consult request. Recommendations have been communicated to the primary team.  We will follow as needed at this time. Please page [phone] for any questions or concerns.       I was in the hospital.  I spent 30 minutes on the real-time audio and video with the patient for a a consultation. Total time was 45 minutes and over 50% of the service was counseling and/or coordination of care within the patient unit.      The patient and/or parent/guardian has been advised of the potential risks and limitations of this mode of treatment (including, but not limited to, the absence of in-person examination) and has agreed to be treated using telemedicine. The  Patient's, patient's guardian's and/or patient's family's questions regarding telemedicine have been answered.     Discussed with and seen by Attending, [name], MD, who agrees with the assessment and plan.    Lillia Pauls, MD   Fellow, PGY-5 - Consultation-Liaison Psychiatry    History:   Relevant Aspects of Hospital Course:  Admitted on 03/18/2020 for AMS thought to be secondary to lithium toxicity. He was recently admitted to Psychiatry at [facility] on 2/19 for management of psychotropics in the context of multiple prior admissions for mania. There was concern for Depakote potentially contributing to hyponatremia, so he was transitioned to Lithium 300 mg bid; of note, hyponatremia appears to predate initiation of Depakote. Olanzapine was increased to 20 mg at bedtime as well with good response. He was discharged on 2/25, but wife called reporting AMS (very confused, poor memory, hands and arms trembling) a day after his discharge. At that time, recommendation was to seek care at the ED. However, he had a follow-up in the adult outpatient clinic on 2/28, where it was recommended that Lithium be held, while Olanzapine was continued. He subsequently was brought to the [facility] ED via EMS due to AMS and examination notable for tachycardia, HTN, tongue fasciculations, tremulousness; work-up at that time largely unrevealing, including lithium level, but did demonstrate recurrent hyponatremia. There was concern for potential ETOH withdrawal with CIWA going from 20 to 43, with good response to Ativan    HPI - Patient Report:   Nicholas Rush is a 55 y.o. 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  Psychiatry was consulted at the request of Soyla Murphy, MD with Family Medicine [facility]) for evaluation of AMS.    Seen via telemedicine. He reports that he is feeling better today, but does describe being  aware of his confusional state, noting that he had memory deficits. He is largely unsure of the overall timeline, but states that he had been on Lithium at least a few days prior to developing tremors and cognitive impairment. He does not demonstrate tremors on examination today. Otherwise, he does not report any signs or symptoms consistent with mania and feels that he has been tolerating both being off of lithium and an increased dose of Olanzapine (though per wife, has been on 20 mg since at least January prior to most recent psychiatric hospitalization). He does not report any perceptual disturbances nor does he report SI or HI. He reports that he has been drinking slim to none and has been sober x 9 months. He does not report any other substance use.    Spoke with wife who reports that he has had a long history of significant alcohol use, which has declined over the past several months. He has had complicated withdrawals that included seizures. Prior to his discharge from inpatient psychiatry, she notes that he was shaky, which worsened, prompting her to call the emergency line. They were then seen emergently by Dr. Polo Riley who recommended stopping lithium and presenting to the ED. She feels that he is not yet back at his baseline and describes his significant mood lability. Notes that he was experiencing perceptual disturbances and being hateful towards herself and his step-children. She does not feel like she can take him home. Notes trials with multiple medications in the past, including a which weren't necessarily helpful; VPA and Olanzapine seem to have been the most. For added context, mother is on VPA, previously on Lithium.    ROS:   All systems reviewed as negative/unremarkable aside from the following pertinent positives and negatives: None unless specified elsewhere    Collateral information:   - Reviewed medical records in Epic  - Spoke to patient's wife    Psychiatric History:   Information collected from patient interview, collateral and chart review.  Prior Diagnoses: BPAD, AUD    Inpatient: Multiple psychiatric hospitalizations over the past year including at [facility], [facility], and [place], with most recent admission to [facility]  Outpatient: Will be following up with Dr. Roxanne Gates in [facility] outpatient clinic    Current medication regimen: Olanzapine 20 mg at bedtime, Naltrexone 50 mg daily  Prior medication trials: Lithium 300 mg bid, Vraylar, Xanax, Citalopram    SIB/SA: No reported prior SIB or SA    Medical History:  Past Medical History:   Diagnosis Date   ??? Anxiety    ??? Bipolar 1 disorder (CMS-HCC)    ??? Depressive disorder    ??? Erectile dysfunction    ??? Esophageal reflux    ??? History of pyloric stenosis    ??? Hypertension    ??? Insomnia    ??? Psoriasis     Dermatologist - Dr. Deloria Lair in [facility]   ??? Seizure (CMS-HCC) 02/20/2019   ??? Tobacco use     1ppd       Surgical History:  Past Surgical History:   Procedure Laterality Date   ??? PYLOROMYOTOMY     ??? WISDOM TOOTH EXTRACTION         Medications:     Current Facility-Administered Medications:   ???  amLODIPine (NORVASC) tablet 10 mg, 10 mg, Oral, Daily, Katherina Right, MD, 10 mg at 03/20/20 0848  ???  calcium carbonate (TUMS) chewable tablet 200 mg of elem calcium, 200 mg of elem calcium, Oral, TID PRN, Sherald Barge, DO  ???  cetirizine (ZyrTEC) tablet 10 mg, 10 mg, Oral, Daily, Vergia Alberts, MD, 10 mg at 03/20/20 0847  ???  enoxaparin (LOVENOX) syringe 40 mg, 40 mg, Subcutaneous, Q24H, Vergia Alberts, MD, 40 mg at 03/19/20 2108  ???  fluticasone propionate (FLONASE) 50 mcg/actuation nasal spray 2 spray, 2 spray, Each Nare, Daily, Vergia Alberts, MD, 2 spray at 03/20/20 0848  ???  folic acid (FOLVITE) tablet 1 mg, 1 mg, Oral, Daily, Sherald Barge, DO, 1 mg at 03/20/20 0847  ???  gabapentin (NEURONTIN) capsule 200 mg, 200 mg, Oral, Nightly, Vergia Alberts, MD, 200 mg at 03/19/20 2108  ???  LORazepam (ATIVAN) injection 2 mg, 2 mg, Intravenous, Once PRN, Sherald Barge, DO  ???  LORazepam (ATIVAN) tablet 2 mg, 2 mg, Oral, Q4H PRN, 2 mg at 03/19/20 1905 **OR** LORazepam (ATIVAN) tablet 4 mg, 4 mg, Oral, Q4H PRN **OR** LORazepam (ATIVAN) tablet 4 mg, 4 mg, Oral, Q30 Min PRN, 4 mg at 03/19/20 2109 **OR** LORazepam (ATIVAN) tablet 4 mg, 4 mg, Oral, Q2H PRN, Sherald Barge, DO  ???  nicotine (NICODERM CQ) 21 mg/24 hr patch 1 patch, 1 patch, Transdermal, Daily, Vergia Alberts, MD, 1 patch at 03/20/20 0848  ???  nicotine polacrilex (NICORETTE) gum 4 mg, 4 mg, Buccal, Q1H PRN, Vergia Alberts, MD  ???  OLANZapine [facility]) tablet 20 mg, 20 mg, Oral, Nightly, Vergia Alberts, MD, 20 mg at 03/19/20 2109    Allergies:  Allergies   Allergen Reactions   ??? Tramadol      Risk of seizures   ??? Hydroxyzine Itching     Sedation.  Ineffective for reducing anxiety.       Social History:   Living situation: the patient lives with their family.  Relationship Status: Married   Children: Yes; 2 step children (19 year old son and 44 year old daughter)  Current/Prior Legal: Yes; reports suspended license currently    Substance Use History:  Tobacco use: Current use, cigarettes (1 pack/day).  Alcohol use: Former use, reports drinking up to 6 beers a day; wife notes fairly significant use historically.    Does not report use of MJ, cocaine, methamphetamines, opioids, or benzodiazepines    Substance Use Treatment (including MAT): Naltrexone currently    PDMP:    10/10/2019  10/06/2019   1  Tramadol Hcl 50 Mg Tablet   28.00  7  Da Mus  [id number]  [facility] (9550)  0/0  20.00 MME  Medicaid  [place]     11/23/2018  11/23/2018   1  Oxycodone Hcl (Ir) 5 Mg Tablet   60.00  5  Ju Ald  [id number]  Wal (9550)  0/0  90.00 MME  Medicaid  [place]     11/16/2018  11/16/2018   1  Hydrocodone-Acetamin 7.5-325   20.00  5  Ju Ald  [id number]  Wal (9550)  0/0  30.00 MME  Medicaid  [place]     09/16/2018  06/30/2018   2  Chlordiazepoxide 25 Mg Capsule   30.00  15  Pa Tob  [id number]  [facility] (0024)  0/0  2.00 LME  Medicaid  [place]     08/24/2018  07/15/2018   2  Chlordiazepoxide 25 Mg Capsule   15.00  7  Pa Tob  [id number]  [facility] (0024)  0/0  2.14 LME  Comm Ins  [place]     04/04/2018  04/04/2018   3  Tramadol Hcl 50 Mg Tablet  Reviewed on 03/20/20.    Family History: Reviewed and updated  The patient's family history includes Alcohol abuse in his maternal aunt and sister; Bipolar disorder in his mother; Depression in his sister.    Objective:   Vital signs:   Temp:  [36.5 ??C-37.1 ??C] 36.7 ??C  Heart Rate:  [70-98] 84  SpO2 Pulse:  [70-95] 71  Resp:  [14-21] 14  BP: (109-155)/(52-93) 155/78  MAP (mmHg):  [73-109] 105  SpO2:  [95 %-98 %] 97 %    Physical Exam:  The patient was in view of the camera.  No acute distress.  Normal work of breathing.  All extremity movements appear intact.  There are no focal neurological deficits observed.     Mental Status Exam:  Appearance: appears stated age, dissheveled and lying in bed, wearing hospital gown, covered in blankets   Attitude:   calm, cooperative and polite   Behavior/Psychomotor:  appropriate eye contact and no abnormal movements   Speech/Language:   slow, somewhat languid, with a gruff quality   Mood:  ???I'm doing better???   Affect:  constricted, subdued   Thought process:  logical, linear, clear, coherent, goal directed   Thought content:    denies thoughts of self-harm. Denies SI, plans, or intent. Denies HI.  No grandiose, self-referential, persecutory, or paranoid delusions noted.   Perceptual disturbances:   denies auditory and visual hallucinations and behavior not concerning for response to internal stimuli   Attention:  able to fully attend without fluctuations in consciousness and on days of the week backwards able to complete successfully   Concentration:  Able to fully concentrate and attend   Orientation:  Oriented to person, place, city, date, day, month, year and situation.   Memory:  not formally tested, but grossly intact   Fund of knowledge:   not formally assessed   Insight:    Fair   Judgment:   Fair   Impulse Control:  Fair     Data Reviewed:  I reviewed labs from the last 24 hours.     Additional Psychometric Testing:  Not applicable.

## 2020-03-20 NOTE — Unmapped (Signed)
Social Work  Psychosocial Assessment    Patient Name: Nicholas Rush   Medical Record Number: [id number]   Date of Birth: [birth year]  Sex: Male     From H and P:  Nicholas Rush is a 55 y.o. male with a past medical history significant for Bipolar Disorder type 1, alcohol use disorder with hx of complicated withdrawal, HTN, psoriasis on adalimumab, and chronic hyponatermia who presents with altered mental status in the setting of hyponatremia, alcohol withdrawal.     Patient was readmitted to hospital after discharge from [facility] inpatient Psych unit on 03/15/20.  The patient is being followed by [facility] STEP clinic.      [employer] - Admitted Since 03/18/2020     Service Provider Selected Services Address Phone Fax Patient Preferred    [facility] Kelton Pillar  Medications for Mental Health ,  Mental Health Evaluation ,  Mental Health Outpatient Treatment [address] [place], [place] [address] [phone] ???        Per medical team, the patient is being evaluated by Psychiatry for safety and will likely be discharged today with no discharge needs.    SW will assist with Psych recommendations as needed and appropriate.     Referral  Reason for Referral: Cognitive / Behavioral / Psych Concerns  No Psychosocial Interventions Necessary: No Psychosocial Interventions Necessary    Extended Emergency Contact Information  Primary Emergency Contact: Bisbee,[name]  Address: [address] [place], [place] [address] [place] [employer] Phone: [phone]  Relation: Spouse  Preferred language: ENGLISH  Interpreter needed? No    Legal Next of Kin / Guardian / POA / Advance Directives      Advance Directive (Medical Treatment)  Does patient have an advance directive covering medical treatment?: Patient has advance directive covering medical treatment, copy not in chart.  Advance directive covering medical treatment not in Chart:: Copy requested from family    Health Care Decision Maker [HCDM] (Medical & Mental Health Treatment)  Healthcare Decision Maker: HCDM documented in the HCDM/Contact Info section.  Information offered on HCDM, Medical & Mental Health advance directives:: Patient declined information.         Discharge Planning  Discharge Planning Information:   Type of Residence   Mailing Address:  [address]  [place] [address]    Medical Information   Past Medical History:   Diagnosis Date   ??? Anxiety    ??? Bipolar 1 disorder (CMS-HCC)    ??? Depressive disorder    ??? Erectile dysfunction    ??? Esophageal reflux    ??? History of pyloric stenosis    ??? Hypertension    ??? Insomnia    ??? Psoriasis     Dermatologist - Dr. Deloria Lair in [facility]   ??? Seizure (CMS-HCC) 02/20/2019   ??? Tobacco use     1ppd       Past Surgical History:   Procedure Laterality Date   ??? PYLOROMYOTOMY     ??? WISDOM TOOTH EXTRACTION         Family History   Problem Relation Age of Onset   ??? Bipolar disorder Mother    ??? Depression Sister    ??? Alcohol abuse Sister    ??? Alcohol abuse Maternal Aunt    ??? Melanoma Neg Hx    ??? Basal cell carcinoma Neg Hx    ???  Squamous cell carcinoma Neg Hx        Financial Information   Primary Insurance: Payor: MEDICAID [place] / Plan: MEDICAID [place] ACCESS / Product Type: *No Product type* /    Secondary Insurance: Secondary Insurance  MEDICAID [place]*   Prescription Coverage: Medicaid   Preferred Pharmacy: [facility]  [facility] [phone] - [place], [place] - [address] AT [address] OF [address] & [address]  [facility] OUT-PT [facility]ACY [facility]  [facility] [phone] - [place],  - [address] [place] AT [facility] [address]    Barriers to taking medication: No    Transition Home   Transportation at time of discharge: Family/Friend's Private Vehicle    Anticipated changes related to Illness: none   Services in place prior to admission: [facility]: [facility] clinic   Services anticipated for DC: TBD   Hemodialysis Prior to Admission: No    Readmission  Risk of Unplanned Readmission Score: UNPLANNED READMISSION SCORE: 40%  Readmitted Within the Last 30 Days? Yes  Readmission Factors include: current reason for admission unrelated to previous admission    Social Determinants of Health  Social Determinants of Health     Tobacco Use: High Risk   ??? Smoking Tobacco Use: Current Every Day Smoker   ??? Smokeless Tobacco Use: Never Used   Alcohol Use: Not At Risk   ??? How often do you have a drink containing alcohol?: 2 - 4 times per month   ??? How many drinks containing alcohol do you have on a typical day when you are drinking?: 3 - 4   ??? How often do you have 5 or more drinks on one occasion?: Monthly   Financial Resource Strain: Low Risk    ??? Difficulty of Paying Living Expenses: Not hard at all   Food Insecurity: No Food Insecurity   ??? Worried About Programme researcher, broadcasting/film/video in the Last Year: Never true   ??? Ran Out of Food in the Last Year: Never true   Transportation Needs: No Transportation Needs   ??? Lack of Transportation (Medical): No   ??? Lack of Transportation (Non-Medical): No   Physical Activity: Not on file   Stress: Not on file   Social Connections: Not on file   Intimate Partner Violence: Not on file   Depression: Not on file   Housing/Utilities: Low Risk    ??? Within the past 12 months, have you ever stayed: outside, in a car, in a tent, in an overnight shelter, or temporarily in someone else's home (i.e. couch-surfing)?: No   ??? Are you worried about losing your housing?: No   ??? Within the past 12 months, have you been unable to get utilities (heat, electricity) when it was really needed?: No   Substance Use: Not on file   Health Literacy: Not on file       Social History  Support Systems/Concerns: Family Members                          Military Service: No Conservator, museum/gallery and Psychiatric History  Psychosocial Stressors: Coping with health challenges/recent hospitalization      Psychological Issues/Information: Mental illness   Concerns: Active mental illness concerns,Patient history of mental illness,Outpatient treatment for mental illness,Inpatient treatment for mental  illness          Chemical Dependency: Alcohol              Outpatient Providers: Primary Care Provider,Psychiatrist   Name / Contact #: : Dr. Roxanne Gates, [facility] STEP Clinic  Legal: No legal issues      Ability to Access Community Services: No issues accessing community services      Floyde Parkins, [place]  CCM Pager number [phone]

## 2020-03-20 NOTE — Unmapped (Signed)
Family Medicine Inpatient Service    Progress Note    Team: [facility] (pgr [phone])    Hospital Day: 1    ASSESSMENT / PLAN:   Nicholas Rush is a 55 y.o. male with a past medical history significant for  Bipolar Disorder type 1, alcohol use disorder with hx of complicated withdrawal, HTN, psoriasis on adalimumab, and chronic hyponatermia who presents with altered mental status in the setting of hyponatremia, now improving.   ??  # Encephalopathy, improving  # hx Seizures  Patient did have an episode of agitation in which she scored a CIWA score of 27 and given Ativan 6 mg. Spoke to patient's wife yesterday who stated alcohol use since November, therefore making alcohol withdrawal unlikely.  She is concerned that he may be having these episodes of AMS secondary to low salt, and states that lately he has been vomiting his salt tabs.  There is also concern that AMS could be secondary to medication since he was recently transitioned from Depakote to lithium on 2/22.  Patient today alert and oriented x3.  Plan to touch base with psychiatrist to discuss medication management in regards to Depakote versus lithium.  Patient does state that he had less issues with Depakote.  - Hold lithium and consult psychiatry  - Discontinue CIWA protocol  - hyponatremia now corrected as below  ??  # Acute on Chronic Hyponatremia  # Hx of SIADH   s/p switch from Depakote to lithium (which can also cause SIADH). However hyponatremia predates starting Depakote too. Recent admission 2/4 for hyponatremia with seizures, was discharged on salt tabs 1 g TID with meals, Fluid restriction 1.5 L/day. Hyponatremia corrected to baseline of 130 with the 1.5L received in the ED, so likely component of acute hypovolemic hyponatremia.  Currently on a normal diet with improvement in sodium, therefore will continue to hold home salt tabs.  -  Na at baseline, can check daily w/BMP  -  If unable to maintain Na, can consider restart salt tablets 1g with meals  ??  # Bipolar 1 Disorder vs other  recent hospitalization with Psychiatry for transition from Depakote to Lithium given hyponatremia. Has had ~5 psych hospitalizations since November. 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  -  Hold lithium until able to discuss with psych  -  Continue Zyprexa 20mg  at bedtime  - consult psych  - gabapentin qhs  ??  #HTN: amlodipine 10 mg daily  ??  #Psoriasis??-??psoriatic arthritis: hold home [place]   ??  # Tobacco use disorder: Patient currently smoking 1 PPD.   - Tobacco cessation consult  - Nicotine replacement therapy with patch, gum, lozenge PRN      # Checklist:  - IVF None  - Tubes/Lines/Drains: PIV x2  - Diet Regular  - Bowel Regimen: No indication for a bowel regimen at this time  - DVT: SQ Lovenox  - Code Status:   Orders Placed This Encounter   Procedures   ??? Full Code     Standing Status:   Standing     Number of Occurrences:   1     - Dispo: Floor    [ ]  Anticipated Discharge Location: Home  [ ]  PT/OT/DME: No needs anticipated  [ ]  CM/SW needs: None anticipated  [ ]  Meds/Rx:  Not yet prescribed. No special med needs  [ ]  Teaching: None anticipated  [ ]  Follow up appt: Appointment needed  [ ]  Excuse  letter: None anticipated  [ ]  Transport: Private Needed    SUBJECTIVE:  Interval events: Had an episode of agitation scoring 27 on CIWA's.  Given Ativan 8 mg and returned to baseline.  Has been sleeping since.  Spoke with his wife yesterday who believes AMS is secondary to medication change versus hyponatremia.  Denies any alcohol use since November.      REVIEW OF SYSTEMS:  Pertinent positives and negatives per HPI. A complete review of systems otherwise negative.    PHYSICAL EXAM:      Intake/Output Summary (Last 24 hours) at 03/20/2020 0759  Last data filed at 03/20/2020 0600  Gross per 24 hour   Intake 1980 ml   Output 1150 ml   Net 830 ml       Recent Vitals:  Vitals:    03/20/20 0400   BP: 120/76   Pulse: 70   Resp: 18   Temp: 36.6 ??C   SpO2: 98%       GEN: Tired appearing, lying in bed, NAD   HEENT: NCAT, No scleral icterus. Conjunctiva non-erythematous. MMM.  CV: Regular rate and rhythm. No murmurs/rubs/gallops.  Pulm: Normal work of breathing on RA. CTAB.  Abd: Flat.  Nontender. No guarding, rebound.   Neuro: A&O x 3. No focal deficits.  Ext: No peripheral edema.  Palpable distal pulses.  Skin: No rashes or skin lesions.       LABS/ STUDIES:    All imaging, laboratory studies, and other pertinent tests including electrocardiography within the last 24 hours were reviewed and are summarized within the assessment and plan.     NUTRITION:                           Sherald Barge, DO PGY2  March 20, 2020 12:13 PM

## 2020-03-20 NOTE — Unmapped (Signed)
Pt was readmitted to the unit  within 72 discharge from the hospital due to AMS. PT is still drowsy most of the shift but alert and oriented X 2 while he is awake. Able to follow the commands and answer questions properly.  CIWA score less than 8. Still has tremors and discoordination more likely chronic issues. Scheduled ativan was discontinued. Family came visited and updated the family the medical team. Pt has good appetite and able consume more than 75% of his meal. Adequate urine output. Continues on the fall precaution and aspiration. No further injury form previous fall in the ED. Reposition every 2 hours. No s/s of acute distress noted. Continue to monitor.   Problem: Fall Injury Risk  Goal: Absence of Fall and Fall-Related Injury  Outcome: Progressing  Intervention: Promote Scientist, clinical (histocompatibility and immunogenetics) Documentation  Taken 03/19/2020 1000 by Trixie Dredge, RN  Safety Interventions:   seizure precautions   low bed  Taken 03/19/2020 0800 by Trixie Dredge, RN  Safety Interventions:   fall reduction program maintained   low bed     Problem: Asthma Comorbidity  Goal: Maintenance of Asthma Control  Outcome: Progressing     Problem: Behavioral Health Comorbidity  Goal: Maintenance of Behavioral Health Symptom Control  Outcome: Progressing     Problem: COPD (Chronic Obstructive Pulmonary Disease) Comorbidity  Goal: Maintenance of COPD Symptom Control  Outcome: Progressing     Problem: Diabetes Comorbidity  Goal: Blood Glucose Level Within Targeted Range  Outcome: Progressing     Problem: Heart Failure Comorbidity  Goal: Maintenance of Heart Failure Symptom Control  Outcome: Progressing     Problem: Hypertension Comorbidity  Goal: Blood Pressure in Desired Range  Outcome: Progressing     Problem: Obstructive Sleep Apnea Risk or Actual Comorbidity Management  Goal: Unobstructed Breathing During Sleep  Outcome: Progressing     Problem: Osteoarthritis Comorbidity  Goal: Maintenance of Osteoarthritis Symptom Control  Outcome: Progressing  Intervention: Maintain Osteoarthritis Symptom Control  Recent Flowsheet Documentation  Taken 03/19/2020 0814 by Trixie Dredge, RN  Activity Management: activity adjusted per tolerance     Problem: Pain Chronic (Persistent) (Comorbidity Management)  Goal: Acceptable Pain Control and Functional Ability  Outcome: Progressing     Problem: Seizure Disorder Comorbidity  Goal: Maintenance of Seizure Control  Outcome: Progressing  Intervention: Maintain Seizure-Symptom Control  Recent Flowsheet Documentation  Taken 03/19/2020 0800 by Trixie Dredge, RN  Seizure Precautions: clutter-free environment maintained     Problem: Skin Injury Risk Increased  Goal: Skin Health and Integrity  Outcome: Progressing  Intervention: Optimize Skin Protection  Recent Flowsheet Documentation  Taken 03/19/2020 1600 by Trixie Dredge, RN  Pressure Reduction Techniques: frequent weight shift encouraged  Taken 03/19/2020 1400 by Trixie Dredge, RN  Pressure Reduction Techniques: frequent weight shift encouraged  Taken 03/19/2020 1200 by Trixie Dredge, RN  Pressure Reduction Techniques: frequent weight shift encouraged  Taken 03/19/2020 1000 by Trixie Dredge, RN  Pressure Reduction Techniques: frequent weight shift encouraged  Taken 03/19/2020 0814 by Trixie Dredge, RN  Pressure Reduction Techniques: frequent weight shift encouraged  Head of Bed (HOB) Positioning: HOB elevated     Problem: Adult Inpatient Plan of Care  Goal: Plan of Care Review  Outcome: Progressing  Goal: Patient-Specific Goal (Individualized)  Outcome: Progressing  Goal: Absence of Hospital-Acquired Illness or Injury  Outcome: Progressing  Intervention: Identify and Manage Fall Risk  Recent Flowsheet Documentation  Taken 03/19/2020 1000 by Trixie Dredge, RN  Safety Interventions:   seizure precautions   low  bed  Taken 03/19/2020 0800 by Trixie Dredge, RN  Safety Interventions:   fall reduction program maintained   low bed  Intervention: Prevent and Manage VTE (Venous Thromboembolism) Risk  Recent Flowsheet Documentation  Taken 03/19/2020 0814 by Trixie Dredge, RN  Activity Management: activity adjusted per tolerance  Intervention: Prevent Infection  Recent Flowsheet Documentation  Taken 03/19/2020 0800 by Trixie Dredge, RN  Infection Prevention:   hand hygiene promoted   rest/sleep promoted  Goal: Optimal Comfort and Wellbeing  Outcome: Progressing  Goal: Readiness for Transition of Care  Outcome: Progressing  Goal: Rounds/Family Conference  Outcome: Progressing     Problem: Self-Care Deficit  Goal: Improved Ability to Complete Activities of Daily Living  Outcome: Progressing

## 2020-03-20 NOTE — Unmapped (Addendum)
Early beginning of the shift CIWA score at 27,  Pt highly agitated, 4mg  PO rescue ativan was given per orders, results effective, Pt calm and sleeping. Remains afebrile, VSS, O2 Sats maintained on RA, PO intake and UOP adequate via condom cath. Denies pain, bed alarm activated for safety, will continue to monitor closely.    Problem: Fall Injury Risk  Goal: Absence of Fall and Fall-Related Injury  Outcome: Progressing  Intervention: Promote Scientist, clinical (histocompatibility and immunogenetics) Documentation  Taken 03/20/2020 0000 by Ander Purpura, RN  Safety Interventions:  ??? bed alarm  ??? fall reduction program maintained  Taken 03/19/2020 2200 by Ander Purpura, RN  Safety Interventions:  ??? bed alarm  ??? fall reduction program maintained  ??? low bed  Taken 03/19/2020 2000 by Ander Purpura, RN  Safety Interventions: bed alarm     Problem: Asthma Comorbidity  Goal: Maintenance of Asthma Control  Outcome: Progressing

## 2020-03-21 LAB — BASIC METABOLIC PANEL
ANION GAP: 9 mmol/L (ref 5–14)
BLOOD UREA NITROGEN: 11 mg/dL (ref 9–23)
BUN / CREAT RATIO: 16
CALCIUM: 9 mg/dL (ref 8.7–10.4)
CHLORIDE: 102 mmol/L (ref 98–107)
CO2: 24.3 mmol/L (ref 20.0–31.0)
CREATININE: 0.7 mg/dL
EGFR CKD-EPI AA MALE: >90 mL/min/{1.73_m2} (ref >=60)
EGFR CKD-EPI NON-AA MALE: >90 mL/min/{1.73_m2} (ref >=60)
GLUCOSE RANDOM: 99 mg/dL (ref 70–179)
POTASSIUM: 3.7 mmol/L (ref 3.4–4.8)
SODIUM: 135 mmol/L (ref 135–145)

## 2020-03-21 LAB — FOLATE: FOLATE: >24 ng/mL (ref >=5.4)

## 2020-03-21 MED ADMIN — LORazepam (ATIVAN) tablet 2 mg: 2 mg | ORAL | @ 04:00:00

## 2020-03-21 MED ADMIN — folic acid (FOLVITE) tablet 1 mg: 1 mg | ORAL | @ 15:00:00

## 2020-03-21 MED ADMIN — nicotine (NICODERM CQ) 21 mg/24 hr patch 1 patch: 1 | TRANSDERMAL | @ 15:00:00

## 2020-03-21 MED ADMIN — OLANZapine (ZYPREXA) tablet 20 mg: 20 mg | ORAL | @ 01:00:00

## 2020-03-21 MED ADMIN — cetirizine (ZyrTEC) tablet 10 mg: 10 mg | ORAL | @ 15:00:00

## 2020-03-21 MED ADMIN — calcium carbonate (TUMS) chewable tablet 200 mg of elem calcium: 200 mg | ORAL | @ 19:00:00

## 2020-03-21 MED ADMIN — melatonin tablet 3 mg: 3 mg | ORAL | @ 23:00:00

## 2020-03-21 MED ADMIN — enoxaparin (LOVENOX) syringe 40 mg: 40 mg | SUBCUTANEOUS | @ 01:00:00

## 2020-03-21 MED ADMIN — gabapentin (NEURONTIN) capsule 200 mg: 200 mg | ORAL | @ 01:00:00

## 2020-03-21 MED ADMIN — naltrexone (DEPADE) tablet 50 mg: 50 mg | ORAL | @ 17:00:00

## 2020-03-21 MED ADMIN — amLODIPine (NORVASC) tablet 10 mg: 10 mg | ORAL | @ 15:00:00

## 2020-03-21 NOTE — Unmapped (Signed)
Family Medicine Inpatient Service    Progress Note    Team: [facility] (pgr [phone])    Hospital Day: 2    ASSESSMENT / PLAN:   Nicholas Rush is a 55 y.o. male with a past medical history significant for  Bipolar Disorder type 1, alcohol use disorder with hx of complicated withdrawal, HTN, psoriasis on adalimumab, and chronic hyponatermia who presents with altered mental status in the setting of hyponatremia, now improving.   ??  # Encephalopathy, improving  # hx Seizures  Patient did have an episode of agitation in which she scored a CIWA score of 27 and given Ativan 6 mg. Spoke to patient's wife yesterday who stated alcohol use since November, therefore making alcohol withdrawal unlikely.  She is concerned that he may be having these episodes of AMS secondary to low salt, and states that lately he has been vomiting his salt tabs.  There is also concern that AMS could be secondary to medication since he was recently transitioned from Depakote to lithium on 2/22.  Patient today alert and oriented x3.  Plan to touch base with psychiatrist to discuss medication management in regards to Depakote versus lithium.  Patient does state that he had less issues with Depakote.  - Hold lithium and depakote per psych  - Discontinued CIWA protocol  - hyponatremia now corrected as below  ??  # Acute on Chronic Hyponatremia  # Hx of SIADH   s/p switch from Depakote to lithium (which can also cause SIADH). However hyponatremia predates starting Depakote too. Recent admission 2/4 for hyponatremia with seizures, was discharged on salt tabs 1 g TID with meals, Fluid restriction 1.5 L/day. Hyponatremia corrected to baseline of 130 with the 1.5L received in the ED, so likely component of acute hypovolemic hyponatremia.  Currently on a normal diet with improvement in sodium, therefore will continue to hold home salt tabs.  -  Na at baseline, can check daily w/ BMP  -  If unable to maintain Na, can consider restart salt tablets 1g with meals  ??  # Bipolar 1 Disorder vs other  recent hospitalization with Psychiatry for transition from Depakote to Lithium given hyponatremia. Has had ~5 psych hospitalizations since November. 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  -  Hold lithium and depakote  - Discuss restarting depakote or another agent w/ psych  -  Continue Zyprexa 20mg  at bedtime  -  consult psych  -  gabapentin qhs  ??  #HTN: amlodipine 10 mg daily  ??  #Psoriasis??-??psoriatic arthritis: hold home [place]   ??  # Tobacco use disorder: Patient currently smoking 1 PPD.   - Tobacco cessation consult  - Nicotine replacement therapy with patch, gum, lozenge PRN      # Checklist:  - IVF None  - Tubes/Lines/Drains: PIV x2  - Diet Regular  - Bowel Regimen: No indication for a bowel regimen at this time  - DVT: SQ Lovenox  - Code Status:   Orders Placed This Encounter   Procedures   ??? Full Code     Standing Status:   Standing     Number of Occurrences:   1     - Dispo: Floor    [ ]  Anticipated Discharge Location: Home  [ ]  PT/OT/DME: No needs anticipated  [ ]  CM/SW needs: None anticipated  [ ]  Meds/Rx:  Not yet prescribed. No special med needs  [ ]  Teaching: None anticipated  [ ]   Follow up appt: Appointment needed  [ ]  Excuse letter: None anticipated  [ ]  Transport: Private Needed    SUBJECTIVE:  Interval events: Wife and family very concerned about stopping depakote      REVIEW OF SYSTEMS:  Pertinent positives and negatives per HPI. A complete review of systems otherwise negative.    PHYSICAL EXAM:      Intake/Output Summary (Last 24 hours) at 03/21/2020 1205  Last data filed at 03/21/2020 0800  Gross per 24 hour   Intake 815 ml   Output 1400 ml   Net -585 ml       Recent Vitals:  Vitals:    03/21/20 1008   BP: 114/69   Pulse:    Resp:    Temp:    SpO2:        GEN: Tired appearing, lying in bed, NAD   HEENT: NCAT, No scleral icterus. Conjunctiva non-erythematous. MMM.  CV: Regular rate and rhythm. No murmurs/rubs/gallops.  Pulm: Normal work of breathing on RA. CTAB.  Abd: Flat.  Nontender. No guarding, rebound.   Neuro: A&O x 3. No focal deficits.  Ext: No peripheral edema.  Palpable distal pulses.  Skin: No rashes or skin lesions.       LABS/ STUDIES:    All imaging, laboratory studies, and other pertinent tests including electrocardiography within the last 24 hours were reviewed and are summarized within the assessment and plan.     NUTRITION:                           Angelita Ingles, MD PGY2

## 2020-03-21 NOTE — Unmapped (Signed)
 [facility]  Follow-up Psychiatry Telehealth Consult Note     Service Date: March 21, 2020  LOS:  LOS: 2 days      Assessment:   ARWIN HAAG III is a 55 y.o. 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  Psychiatry was consulted at the request of Soyla Murphy, MD with [facility]) for evaluation of AMS.    On evaluation, he demonstrates intact attention testing, without evidence of frank AMS. Based on his history, it is concerning overall for AMS secondary to lithium toxicity, though unclear if this was precipitated by something such as volume loss, concurrent medication use (NSAIDs, ACE inhibitors), or underlying sensitivity to lithium. While his lithium level was not elevated when checked, unclear if this was a true trough and may not clearly represent a true level. Additionally, certain individuals have sensitivity to lithium even when level considered therapeutic. As such, likely represents intolerance to lithium as a mood stabilizer. However, both patient and family express significant concern for monotherapy with olanzapine or other antipsychotic alone given patient's repeated decompensations. Discussed alternative mood stabilizers, including tegretol and lamictal which are also not ideal in his scenairo either (medication-medication interaction and inability to . Discussed cautious reintroduction of depakote, as patient has never been on both depakote and olanzapine while also taking salt supplementation. Patient and significant other are very aware of risk of hyponatremia with this proposed option, and believe the benefit of psychiatric stability outweighs that risk paired with careful monitoring. Discussed with patient plan to restart depakote with weekly sodium checks at PCP's office, and rapid follow-up in psychiatry following discharge. Please see below for detailed recommendations.    Diagnoses:   Active Hospital problems:  Delirium     Problems edited/added by me:  No problems updated.    Safety Risk Assessment:  A suicide and violence risk assessment was performed as part of this evaluation. Risk factors for self-harm/suicide: history of substance abuse, poor adherence to treatment  and past head injury.  Protective factors against self-harm/suicide:  lack of active SI, no history of previous suicide attempts , currently receiving mental health treatment, supportive family, minor children living at home, presence of an available support system and safe housing.  Risk factors for harm to others: past substance abuse.  Protective factors against harm to others: no active symptoms of psychosis, no active symptoms of mania, no previous acts of violence in current setting and connectedness to family.  While future psychiatric events cannot be accurately predicted, the patient is not currently at elevated acute risk, and is at elevated chronic risk of harm to self and is not currently at elevated acute risk, and is not at elevated chronic risk of harm to others.     Recommendations:   ##Safety:   -- No acute safety concerns.  Please see safety assessment for further discussion.    ##Medications:   -- Continue olanzapine 20 mg at bedtime  --START Depakote ER 750 mg nightly at bedtime   --obtain level Sunday 03/06 AM   -- continue Naltrexone at 50 mg daily for AUD  -- START Melatonin 3 mg q1800 for sleep and circadian rhythm regulation.    ##Further Work-up:   -- continue obtaining sodium levels daily until discharge   -- plan to  obtain Na and VPA levels as outpatient 03/09 and 03/16  -- Continue to work-up and treat possible medical conditions that may be contributing to current presentation.     ##Medical Decision Making Capacity:   -- A formal capacity assessement was not performed as a part of this evaluation.  If specific capacity questions arise, please contact our team as below.     ##Behavioral - Environmental:   -- Although not currently delirious, the patient is at an elevated risk for developing delirium. Please utilize delirium prevention protocol.    ##Disposition:   -- There are no psychiatric contraindications to discharging this patient when medically appropriate.  -- The patient will follow-up with Durene Fruits MD at [facility] clinic 03/24  for mental health care at the time of discharge.    Thank you for this consult request. Recommendations have been communicated to the primary team.  We will follow as needed at this time. Please page [phone] for any questions or concerns.       I was in the hospital.  I spent 30 minutes on the real-time audio and video with the patient for a a consultation. Total time was 45 minutes and over 50% of the service was counseling and/or coordination of care within the patient unit.      The patient and/or parent/guardian has been advised of the potential risks and limitations of this mode of treatment (including, but not limited to, the absence of in-person examination) and has agreed to be treated using telemedicine. The  Patient's, patient's guardian's and/or patient's family's questions regarding telemedicine have been answered.     Discussed with and seen by Attending, Voncille Lo, MD, who agrees with the assessment and plan.    Ulla Gallo, MD     I saw and evaluated the patient, participating in the key portions of the service.  I spent 30 minutes on the real-time audio and video with the patient for a a consultation. Total time was 45 minutes and over 50% of the service was counseling and/or coordination of care within the patient unit.  I reviewed the resident???s note.  I agree with the resident???s findings and plan.     Hubert Azure, MD     History:   Relevant Aspects of Hospital Course:  Admitted on 03/18/2020 for AMS thought to be secondary to lithium toxicity. He was recently admitted to Psychiatry at [facility] on 2/19 for management of psychotropics in the context of multiple prior admissions for mania. There was concern for Depakote potentially contributing to hyponatremia, so he was transitioned to Lithium 300 mg bid; of note, hyponatremia appears to predate initiation of Depakote.       Patient Report:   Seen via telemedicine, significant other in the room at the time which patient expressed care with. He reports that he is feeling better today, stating once lithium was discontinued his thinking cleared significantly once off lithium. Both he and significant other express frustration that depakote was discontinued and transitioned to lithium, but expressed understanding the possible risks. Discussed monotherapy with olanzapine or alternative antipsychotic, which both were strongly opposed to as wife has noted he's already been significantly irritable and agitated at night during this hospitalization with only olanzapine. Discussed other mood stabilizers such as tegretol and lamictal with their own unique limitations in his case as well. Significant other relayed that few nights before he transferred to [facility] crisis unit pt was the best he had been psychiatrically for many months on  the combination of olanzapine and depakote. Discussed extensively risks of depakote worsening hyponatremia, however never trialed this combo with salt tabs. Also discussed olanzapine can also cause SIADH. Pt and SO both expressed understanding with risks and opted to trial depakote. Made plan to monitor sodium for next few days with plan to obtain Na levels weekly and follow-up in psychiatry clinic in 2 weeks following discharge. Pt amenable to ongoing admission in order to prevent repeat admissions as he has experienced past month.     ROS:   All systems reviewed as negative/unremarkable aside from the following pertinent positives and negatives: None unless specified elsewhere    Collateral information:   - Reviewed medical records in Epic  - Spoke to patient's wife    Psychiatric, social, family and medical History: updated as appropriate       Medications:     Current Facility-Administered Medications:     amLODIPine (NORVASC) tablet 10 mg, 10 mg, Oral, Daily, Jolyn Nap, MD, 10 mg at 03/21/20 1008    calcium carbonate (TUMS) chewable tablet 200 mg of elem calcium, 200 mg of elem calcium, Oral, TID PRN, Jolyn Nap, MD    cetirizine (ZyrTEC) tablet 10 mg, 10 mg, Oral, Daily, Jolyn Nap, MD, 10 mg at 03/21/20 1008    enoxaparin (LOVENOX) syringe 40 mg, 40 mg, Subcutaneous, Q24H, Jolyn Nap, MD, 40 mg at 03/20/20 2021    fluticasone propionate (FLONASE) 50 mcg/actuation nasal spray 2 spray, 2 spray, Each Nare, Daily, Jolyn Nap, MD, 2 spray at 03/20/20 0848    folic acid (FOLVITE) tablet 1 mg, 1 mg, Oral, Daily, Jolyn Nap, MD, 1 mg at 03/21/20 1008    gabapentin (NEURONTIN) capsule 200 mg, 200 mg, Oral, Nightly, Jolyn Nap, MD, 200 mg at 03/20/20 2022    LORazepam (ATIVAN) injection 2 mg, 2 mg, Intravenous, Once PRN, Jolyn Nap, MD    melatonin tablet 3 mg, 3 mg, Oral, QPM, Jolyn Nap, MD    naltrexone (DEPADE) tablet 50 mg, 50 mg, Oral, Daily, Angelita Ingles, MD    nicotine (NICODERM CQ) 21 mg/24 hr patch 1 patch, 1 patch, Transdermal, Daily, Jolyn Nap, MD, 1 patch at 03/21/20 1008    nicotine polacrilex (NICORETTE) gum 4 mg, 4 mg, Buccal, Q1H PRN, Jolyn Nap, MD    OLANZapine (ZYPREXA) tablet 20 mg, 20 mg, Oral, Nightly, Jolyn Nap, MD, 20 mg at 03/20/20 2024    Allergies:  Allergies   Allergen Reactions    Tramadol      Risk of seizures    Hydroxyzine Itching     Sedation.  Ineffective for reducing anxiety.         PDMP:    10/10/2019  10/06/2019   1  Tramadol Hcl 50 Mg Tablet   28.00  7  Da Mus  [id number]  Wal ([id number])  0/0  20.00 MME  Medicaid  [place]     11/23/2018  11/23/2018   1  Oxycodone Hcl (Ir) 5 Mg Tablet   60.00  5  Ju Ald  [id number] Wal ([id number])  0/0  90.00 MME  Medicaid  [place]     11/16/2018  11/16/2018   1  Hydrocodone-Acetamin 7.5-325   20.00  5  Ju Ald  [id number]  Wal ([id number])  0/0  30.00 MME  Medicaid  [place]     09/16/2018  06/30/2018   2  Chlordiazepoxide 25 Mg Capsule   30.00  15  Pa Tob  [id number]  Har ([id number])  0/0  2.00 LME  Medicaid  [place]     08/24/2018  07/15/2018   2  Chlordiazepoxide 25 Mg Capsule   15.00  7  Pa Tob  [id number]  Har ([id number])  0/0  2.14 LME  Comm Ins  [place]     04/04/2018  04/04/2018   3  Tramadol Hcl 50 Mg Tablet              Reviewed on 03/21/20.    Objective:   Vital signs:   Temp:  [36.5 ??C-36.7 ??C] 36.5 ??C  Heart Rate:  [72-90] 72  Resp:  [12-18] 16  BP: (114-173)/(69-98) 114/69  MAP (mmHg):  [98-125] 98  SpO2:  [97 %-99 %] 97 %    Physical Exam:  The patient was in view of the camera.  No acute distress.  Normal work of breathing.  All extremity movements appear intact.  There are no focal neurological deficits observed.     Mental Status Exam:  Appearance:  appears stated age, dissheveled and lying in bed, wearing hospital gown, covered in blankets   Attitude:   calm, cooperative and polite   Behavior/Psychomotor:  appropriate eye contact and no abnormal movements   Speech/Language:    slow, somewhat languid, with a gruff quality   Mood:  ???I'm doing better???   Affect:  constricted, subdued   Thought process:  logical, linear, clear, coherent, goal directed   Thought content:    denies thoughts of self-harm. Denies SI, plans, or intent. Denies HI.  No grandiose, self-referential, persecutory, or paranoid delusions noted.   Perceptual disturbances:   denies auditory and visual hallucinations and behavior not concerning for response to internal stimuli   Attention:  able to fully attend without fluctuations in consciousness and on days of the week backwards able to complete successfully   Concentration:  Able to fully concentrate and attend   Orientation:  Oriented to person, place, city, date, day, month, year and situation.   Memory: not formally tested, but grossly intact   Fund of knowledge:   not formally assessed   Insight:    Fair   Judgment:   Fair   Impulse Control:  Fair     Data Reviewed:  I reviewed labs from the last 24 hours.     Additional Psychometric Testing:  Not applicable.

## 2020-03-21 NOTE — Unmapped (Signed)
PHYSICAL THERAPY  Evaluation (03/21/20 1141)     Patient Name:  Nicholas Rush       Medical Record Number: [id number]   Date of Birth: 1966-01-11  Sex: Male            Treatment Diagnosis: PT order for weakness.    Activity Tolerance: Tolerated treatment well    ASSESSMENT  Problem List: Decreased strength,Impaired balance,Decreased mobility,Fall Risk,Impaired ADLs,Gait deviation,Postural Weakness     Assessment : Nicholas Rush is a 55 y.o. male with a past medical history significant for Bipolar Disorder type 1, alcohol use disorder with hx of complicated withdrawal, HTN, psoriasis on adalimumab, and chronic hyponatermia who presents with altered mental status in the setting of hyponatremia, alcohol withdrawal.    Pt presents to skilled PT services with deficits in functional strength and balance, requiring RW device with CGA assist for mobility. OOB mobility limited by functional weakness and initial BLE shakiness. Therapist reviewed techniques for sit<>stand transfers and use of tub bench for increased safety with home tub/shower transfers. Pt would benefit from continued skilled PT services to address safety, education and deficits. After review of contributing co-morbitities and personal factors, clinical presentation and exam findings, patient demonstrates moderate complexity for evaluation and development of POC.  AMPAC 17/20.      Recommend 3x/wk post-acute stay.       Today's Interventions: PT eval, transfers and gait, pt and wife educated re: PT role and POC, falls risk reduction with potential equipment recommendations to improve home safety, rationale for home health vs outpatient PT                          PLAN  Planned Frequency of Treatment:  1-2x per day for: 3-4x week      Planned Interventions: Balance activities,Education - Patient,Functional mobility,Gait training,Postural re-education,Therapeutic exercise,Therapeutic activity,Transfer training,Education - Family / caregiver,Endurance activities,Home exercise program,Neuromuscular re-education,Self-care / Home training,Stair training,Wheelchair training    Post-Discharge Physical Therapy Recommendations:  3x weekly    PT DME Recommendations: Wheelchair Engineer, drilling)      Equipment Specifications: Transport wheelchair with removeable leg rests and drop/swing-away armrests    Goals:   Patient and Family Goals: Wife reports she wants to be able to safely transport pt to appointments.            SHORT GOAL #1: Pt will complete sit<>stand with LRAD and SBA.              Time Frame : 2 weeks  SHORT GOAL #2: Pt will ambulate with LRAD and SBA x100 ft.              Time Frame : 2 weeks  SHORT GOAL #3: Pt will complete car transfer with min-A.              Time Frame : 2 weeks                                        Prognosis:  Fair  Positive Indicators: good motivation, family support  Barriers to Discharge: Functional strength deficits,Decreased safety awareness,Gait instability,Impaired Balance    SUBJECTIVE     Patient reports: Agreeable to work with PT  Current Functional Status: Pt received supine in bed, concluded session seated EOB with wife at bedside, all needs in reach, RN aware.  Services patient receives:  PT  Prior Functional Status: Pt reports that he ambulates mod-I with rollator or scoots around in computer chair at home, and sometimes is unable to safely ambulate d/t LE shakiness.  Wife reports that sometimes she has to cancel appointments for him d/t inability to get him safely to the car and is requesting transport w/c for home use.  Both pt and wife report that bath tub/shower transfers are challenging.  Equipment available at home: [employer] chair without back     Past Medical History:   Diagnosis Date   ??? Anxiety    ??? Bipolar 1 disorder (CMS-HCC)    ??? Depressive disorder    ??? Erectile dysfunction    ??? Esophageal reflux    ??? History of pyloric stenosis    ??? Hypertension    ??? Insomnia    ??? Psoriasis     Dermatologist - Dr. Deloria Lair in [facility]   ??? Seizure (CMS-HCC) 02/20/2019   ??? Tobacco use     1ppd    Social History     Tobacco Use   ??? Smoking status: Current Every Day Smoker     Packs/day: 1.00     Years: 30.00     Pack years: 30.00     Types: Cigarettes   ??? Smokeless tobacco: Never Used   Substance Use Topics   ??? Alcohol use: Yes     Comment: 3 drinks a day.      Past Surgical History:   Procedure Laterality Date   ??? PYLOROMYOTOMY     ??? WISDOM TOOTH EXTRACTION      Family History   Problem Relation Age of Onset   ??? Bipolar disorder Mother    ??? Depression Sister    ??? Alcohol abuse Sister    ??? Alcohol abuse Maternal Aunt    ??? Melanoma Neg Hx    ??? Basal cell carcinoma Neg Hx    ??? Squamous cell carcinoma Neg Hx         Allergies: Tramadol and Hydroxyzine                Objective Findings  Precautions / Restrictions  Precautions: Seizure precautions,Falls precautions  Weight Bearing Status: Non-applicable  Required Braces or Orthoses: Non-applicable    Communication Preference: Verbal   Pain Comments: Pt denies pain  Medical Tests / Procedures: H&P, orders, vitals, progress notes/consults  Equipment / Environment: Public relations account executive wearing mask for full session,Patient not wearing mask for full session,Vascular access (PIV, TLC, Port-a-cath, PICC)    At Rest: VSS per Epic  With Activity: NAD  Orthostatics: Asymptomatic  Airway Clearance: OOB mobility    Living Situation  Living Environment: House  Lives With: Significant other,Extended Family  Home Living: One level home,Ramped entrance,Tub/shower unit,Hand-held shower hose,Standard height toilet,Raised toilet seat with rails,Shower chair without back     Cognition  Cognition: Impaired/Limited  Cognition comment: Pt confused regarding his previously scheduled PT appointments, with wife stating that she had discussed these with him and he forgot.  Pt responding appropriately to PT questions, though wife provides additional information/detail throughout.       UE ROM / Strength  UE ROM/Strength: Left WFL,Right WFL  LE ROM / Strength  LE ROM/Strength: Left Impaired/Limited,Right Impaired/Limited  RLE Impairment: Reduced strength  LLE Impairment: Reduced strength  LE comment: ~ 4/5 to MMT of B hip/knee extension, however functional strength limited requiring multiple attempts for sucessful sit>stand and poor eccentric control with stand>sit    Motor/ Sensory/ Neuro  Coordination:  Not tested  Sensation: [facility]  Balance: Standing balance (needs UE support)  Posture comment: Forward flexed posture over RW     Bed Mobility: Supine<>sit indep.    Transfers  Transfers: Sit to Stand  Sit to Stand comments: Sit<>stand with CGA and multiple attempts to power up to standing, RW for balance once in standing.     Gait  Level of Assistance: Contact guard assist, steadying assist  Assistive Device: Front wheel walker  Distance Ambulated (ft): 80 ft  Gait: Feet flat at IC, decreased B step length, forward flexed over RW, B LEs shakey with initial few steps progressing to increased LE stability               Endurance: Pt tolerated session well, did not want to return to supine at end of session preferring to sit EOB    Physical Therapy Session Duration  PT Individual [mins]: 22    Medical Staff Made Aware: RN Victorino Dike    I attest that I have reviewed the above information.  Signed: Maryan Puls, PT  Filed 03/21/2020

## 2020-03-21 NOTE — Unmapped (Signed)
Problem: Fall Injury Risk  Goal: Absence of Fall and Fall-Related Injury  Outcome: Progressing     Problem: Asthma Comorbidity  Goal: Maintenance of Asthma Control  Outcome: Progressing     Problem: Behavioral Health Comorbidity  Goal: Maintenance of Behavioral Health Symptom Control  Outcome: Progressing     Problem: COPD (Chronic Obstructive Pulmonary Disease) Comorbidity  Goal: Maintenance of COPD Symptom Control  Outcome: Progressing     Problem: Diabetes Comorbidity  Goal: Blood Glucose Level Within Targeted Range  Outcome: Progressing     Problem: Heart Failure Comorbidity  Goal: Maintenance of Heart Failure Symptom Control  Outcome: Progressing     Problem: Hypertension Comorbidity  Goal: Blood Pressure in Desired Range  Outcome: Progressing     Problem: Obstructive Sleep Apnea Risk or Actual Comorbidity Management  Goal: Unobstructed Breathing During Sleep  Outcome: Progressing     Problem: Osteoarthritis Comorbidity  Goal: Maintenance of Osteoarthritis Symptom Control  Outcome: Progressing     Problem: Pain Chronic (Persistent) (Comorbidity Management)  Goal: Acceptable Pain Control and Functional Ability  Outcome: Progressing     Problem: Seizure Disorder Comorbidity  Goal: Maintenance of Seizure Control  Outcome: Progressing     Problem: Skin Injury Risk Increased  Goal: Skin Health and Integrity  Outcome: Progressing     Problem: Adult Inpatient Plan of Care  Goal: Plan of Care Review  Outcome: Progressing  Goal: Patient-Specific Goal (Individualized)  Outcome: Progressing  Goal: Absence of Hospital-Acquired Illness or Injury  Outcome: Progressing  Goal: Optimal Comfort and Wellbeing  Outcome: Progressing  Goal: Readiness for Transition of Care  Outcome: Progressing  Goal: Rounds/Family Conference  Outcome: Progressing     Problem: Self-Care Deficit  Goal: Improved Ability to Complete Activities of Daily Living  Outcome: Progressing

## 2020-03-21 NOTE — Unmapped (Signed)
Problem: Fall Injury Risk  Goal: Absence of Fall and Fall-Related Injury  Outcome: Progressing  Intervention: Promote Scientist, clinical (histocompatibility and immunogenetics) Documentation  Taken 03/21/2020 0200 by Nestor Lewandowsky, RN  Safety Interventions:   bed alarm   fall reduction program maintained   low bed   nonskid shoes/slippers when out of bed  Taken 03/21/2020 0000 by Nestor Lewandowsky, RN  Safety Interventions:   bed alarm   fall reduction program maintained   low bed   nonskid shoes/slippers when out of bed  Taken 03/20/2020 2230 by Nestor Lewandowsky, RN  Safety Interventions:   bed alarm   fall reduction program maintained   low bed   nonskid shoes/slippers when out of bed     Problem: Asthma Comorbidity  Goal: Maintenance of Asthma Control  Outcome: Progressing     Problem: Behavioral Health Comorbidity  Goal: Maintenance of Behavioral Health Symptom Control  Outcome: Progressing     Problem: COPD (Chronic Obstructive Pulmonary Disease) Comorbidity  Goal: Maintenance of COPD Symptom Control  Outcome: Progressing     Problem: Diabetes Comorbidity  Goal: Blood Glucose Level Within Targeted Range  Outcome: Progressing     Problem: Heart Failure Comorbidity  Goal: Maintenance of Heart Failure Symptom Control  Outcome: Progressing     Problem: Hypertension Comorbidity  Goal: Blood Pressure in Desired Range  Outcome: Progressing     Problem: Obstructive Sleep Apnea Risk or Actual Comorbidity Management  Goal: Unobstructed Breathing During Sleep  Outcome: Progressing     Problem: Osteoarthritis Comorbidity  Goal: Maintenance of Osteoarthritis Symptom Control  Outcome: Progressing     Problem: Pain Chronic (Persistent) (Comorbidity Management)  Goal: Acceptable Pain Control and Functional Ability  Outcome: Progressing     Problem: Seizure Disorder Comorbidity  Goal: Maintenance of Seizure Control  Outcome: Progressing     Problem: Skin Injury Risk Increased  Goal: Skin Health and Integrity  Outcome: Progressing  Intervention: Optimize Skin Protection  Recent Flowsheet Documentation  Taken 03/21/2020 0200 by Nestor Lewandowsky, RN  Pressure Reduction Techniques: frequent weight shift encouraged  Taken 03/21/2020 0000 by Nestor Lewandowsky, RN  Pressure Reduction Techniques: frequent weight shift encouraged  Taken 03/20/2020 2230 by Nestor Lewandowsky, RN  Pressure Reduction Techniques: frequent weight shift encouraged     Problem: Adult Inpatient Plan of Care  Goal: Plan of Care Review  Outcome: Progressing  Goal: Patient-Specific Goal (Individualized)  Outcome: Progressing  Goal: Absence of Hospital-Acquired Illness or Injury  Outcome: Progressing  Intervention: Identify and Manage Fall Risk  Recent Flowsheet Documentation  Taken 03/21/2020 0200 by Nestor Lewandowsky, RN  Safety Interventions:   bed alarm   fall reduction program maintained   low bed   nonskid shoes/slippers when out of bed  Taken 03/21/2020 0000 by Nestor Lewandowsky, RN  Safety Interventions:   bed alarm   fall reduction program maintained   low bed   nonskid shoes/slippers when out of bed  Taken 03/20/2020 2230 by Nestor Lewandowsky, RN  Safety Interventions:   bed alarm   fall reduction program maintained   low bed   nonskid shoes/slippers when out of bed  Goal: Optimal Comfort and Wellbeing  Outcome: Progressing  Goal: Readiness for Transition of Care  Outcome: Progressing  Goal: Rounds/Family Conference  Outcome: Progressing     Problem: Self-Care Deficit  Goal: Improved Ability to Complete Activities of Daily Living  Outcome: Progressing

## 2020-03-21 NOTE — Unmapped (Signed)
OCCUPATIONAL THERAPY  Evaluation (03/21/20 1410)    Patient Name:  Nicholas Rush       Medical Record Number: [id number]   Date of Birth: 11/27/1965  Sex: Male          OT Treatment Diagnosis:  Decreased endurance and activity tolerance, generalized weakness    Assessment  Problem List: Fall Risk,Impaired ADLs,Decreased cognition,Decreased strength,Decreased mobility,Impaired balance,Decreased endurance    Assessment: Nicholas Rush is a 55 y.o. male with a past medical history significant for ??Bipolar Disorder type 1, alcohol use disorder??with hx of complicated withdrawal,??HTN, psoriasis on adalimumab,??and chronic hyponatermia??who presents with altered mental status in the setting of hyponatremia, now improving.??    Pt with history of multiple hospital admissions in the past year (medical and inpatient psych), presenting to acute OT with above stated deficits impacting functional safety and Independence in self care and ADL tasks. Prior to ~27months ago, pt grossly Independent-Mod I, though has required up to Total Assist with ADLs and functional transfers/mobility (per chart review and wife's report); pt recently has been Mod I - Min Assist for functional transfers/mobility and ADLs using Rollator or RW. Pt required SBA-Mod Assist this session for functional transfers/mobility using RW and bADLs. Pt benefits from acute OT services to increase functional safety and participation in self care routines and decrease burden of care. Recommend post-acute OT at 3x/wk [facility] for home safety assessment, maximize pt's independence and participation in self care routines, and decrease burden of care.  Review of client factors, occupational history, assessment of occupational performance, and development of POC required Mod complexity OT evaluation.    Today's Interventions: AMPAC 21/24, role of OT, POC, functional transfers, functional safety with RW, LBD, BSC transfer, down-grading grooming task for balance and safety, seated grooming and self care, functional mobility household distance, endurance, activity tolerance    Activity Tolerance During Today's Session  Tolerated treatment well    Plan  Planned Frequency of Treatment:  1-2x per day for: 3-4x week    Planned Interventions:  ADL retraining,Balance activities,Education - Patient,Education - Family / caregiver,Conservation,Endurance activities,Functional cognition,Functional mobility,Positioning,Safety education,Transfer training,UE Strength / coordination Regulatory affairs officer. training,Environmental support    Post-Discharge Occupational Therapy Recommendations:  3x weekly   OT DME Recommendations: Tub Transfer Bench    GOALS:   Patient and Family Goals: I don't want to have to keep coming back to the hospital (per pt report); pt's wife expressed goal for pt to be Independent again.    Long Term Goal #1: Pt will maximize functional independence in self care routines in 4 weeks.    Short Term:  Pt will complete BSC transfer with Supervision using LRAD.   Time Frame : 1 week  Pt will complete toileting tasks Supervision using LRAD.   Time Frame : 1 week  Pt will complete LBD with setup assist using LRAD.   Time Frame : 1 week  Pt will complete +2 grooming tasks at sink with setup assist only (seated or standing).   Time Frame : 1 week     Prognosis:  Good  Positive Indicators:  Initial PLOF (prior to hospitalizations summer 2021), supportive wife, motivation  Barriers to Discharge: Inability to safely perform ADLS,Endurance deficits,Impaired Balance    Subjective  Current Status Received/left in recliner, all needs within reach, wife in room, RN aware  Prior Functional Status Per pt and wife's report, for past ~68months, pt has required assist with most ADLs though has improved to Mod I - Min  Assist particularly with LB bADLs. Pt uses Rollator or RW for functional mobility household distances, hasn't been out in the community much since extensive hospitalizations started last year. Pt typically sits at sink to complete grooming tasks, has shower chair though has some difficulty with transfers into tub.    Medical Tests / Procedures: Reviewed  Services patient receives: PT    Patient / Caregiver reports: He actually is putting his socks on now! wife reports improvement in functional participation recently    Past Medical History:   Diagnosis Date   ??? Anxiety    ??? Bipolar 1 disorder (CMS-HCC)    ??? Depressive disorder    ??? Erectile dysfunction    ??? Esophageal reflux    ??? History of pyloric stenosis    ??? Hypertension    ??? Insomnia    ??? Psoriasis     Dermatologist - Dr. Deloria Lair in [facility]   ??? Seizure (CMS-HCC) 02/20/2019   ??? Tobacco use     1ppd    Social History     Tobacco Use   ??? Smoking status: Current Every Day Smoker     Packs/day: 1.00     Years: 30.00     Pack years: 30.00     Types: Cigarettes   ??? Smokeless tobacco: Never Used   Substance Use Topics   ??? Alcohol use: Yes     Comment: 3 drinks a day.      Past Surgical History:   Procedure Laterality Date   ??? PYLOROMYOTOMY     ??? WISDOM TOOTH EXTRACTION      Family History   Problem Relation Age of Onset   ??? Bipolar disorder Mother    ??? Depression Sister    ??? Alcohol abuse Sister    ??? Alcohol abuse Maternal Aunt    ??? Melanoma Neg Hx    ??? Basal cell carcinoma Neg Hx    ??? Squamous cell carcinoma Neg Hx         Tramadol and Hydroxyzine     Objective Findings  Precautions / Restrictions  Seizure precautions,Falls precautions    Weight Bearing  Non-applicable    Required Braces or Orthoses  Non-applicable    Communication Preference  Verbal    Pain  No c/o pain this session    Equipment / Environment  Vascular access (PIV, TLC, Port-a-cath, PICC),Caregiver wearing mask for full session,Patient not wearing mask for full session (OT wearing mask and eye protection)    Living Situation  Living Environment: Mobile home  Lives With: Spouse  Home Living: One level home,Ramped entrance,Tub/shower unit,Hand-held shower hose,Standard height toilet,Raised toilet seat with rails,Shower chair without back  Equipment available at home: [employer] chair without back,Other (Raised toilet seat with rails)     Cognition   Orientation Level:  Oriented x 4   Arousal/Alertness:  Delayed responses to stimuli   Attention Span:  Appears intact   Memory:  Appears intact   Following Commands:  Follows one step commands without difficulty   Safety Judgment:  Good awareness of safety precautions   Awareness of Errors:  Good awareness of safety precautions   Problem Solving:  Assistance required to identify errors made,Assistance required to generate solutions   Comments: Pt with increased time to respond to simple questions/cues, often looking to wife to answer home/PLOF questions (though alert and oriented x4)    Vision / Hearing   Vision: Wears glasses for reading only  Hearing: No deficit identified  Hand Function:  Right Hand Function: Right hand grip strength, ROM and coordination WNL  Left Hand Function: Left hand grip strength, ROM and coordination WNL  Hand Dominance: Right    Skin Inspection:  Skin Inspection: Intact where visualized    ROM / Strength:  UE ROM/Strength: Left WFL,Right WFL  LE ROM/Strength: Left WFL,Right WFL  LE ROM/ Strength Comment: Pt did not demonstrate figure four positioning this date (though reports he does sometimes at home); some buckling and tremors to BLE upon standing, decreased with functional mobility and time standing    Coordination:  Coordination: Impaired  Coordination comment: Some impairement with bringing toothbrush to mouth while seated at sink and using mirror, pt able to self-correct    Sensation:  RUE Sensation: RUE intact  LUE Sensation: LUE intact    Balance:  Static and dynamic sitting balance=Mod I in recliner, Supervision on BSC at bathroom sink for grooming; static standing balance with RW=CGA intiially transitioning to Supervision, dynamic standing balance with RW=SBA; standing tolerance ~2minutes    Functional Mobility  Transfer Assistance Needed: Yes  Transfers - Needs Assistance: Contact Guard assist (CGA sit<>stand at recliner and BSC transfer with RW; initial BLE shaking upon standing, mostly resolved with funcitonal mobility and prolonged standing)  Bed Mobility - Needs Assistance:  (NT)  Ambulation: Functional mobility household distance recliner<>bathroom with CGA transitioning to SBA using RW      ADLs  ADLs: Needs assistance with ADLs  ADLs - Needs Assistance: LB dressing,Toileting,Bathing,Grooming  Grooming - Needs Assistance: Performed seated,Set Up Assist (Setup assist while seated on BSC at bathroom sink (facewashing, hair brushing, and oral care))  Bathing - Needs Assistance: Min assist (Anticipate Min Assist for posterior hygiene, recommend seated (baseline))  Toileting - Needs Assistance:  (SBA for clothing management at [facility])  LB Dressing - Needs Assistance:  (SBA for standing balance with RW)      Vitals / Orthostatics  At Rest: NAD  With Activity: NAD  Orthostatics: No signs/symptoms      Medical Staff Made Aware: RN Victorino Dike      Occupational Therapy Session Duration  OT Individual [mins]: 25         I attest that I have reviewed the above information.  Signed: Jessica Priest, OT  [facility] 03/21/2020

## 2020-03-21 NOTE — Unmapped (Signed)
Afebrile, vitals stable. Pt alert and oriented, moved to [facility] Medicine floor via stretcher, pt states he will call his significant other and make her aware of transfer.   Problem: Fall Injury Risk  Goal: Absence of Fall and Fall-Related Injury  Outcome: Progressing  Intervention: Promote Injury-Free Environment  Recent Flowsheet Documentation  Taken 03/20/2020 2000 by Carin Hock, RN  Safety Interventions:   bed alarm   fall reduction program maintained   low bed     Problem: Adult Inpatient Plan of Care  Goal: Plan of Care Review  Outcome: Progressing  Goal: Patient-Specific Goal (Individualized)  Outcome: Progressing  Goal: Absence of Hospital-Acquired Illness or Injury  Outcome: Progressing  Intervention: Identify and Manage Fall Risk  Recent Flowsheet Documentation  Taken 03/20/2020 2000 by Carin Hock, RN  Safety Interventions:   bed alarm   fall reduction program maintained   low bed  Intervention: Prevent Skin Injury  Recent Flowsheet Documentation  Taken 03/20/2020 2000 by Carin Hock, RN  Skin Protection:   incontinence pads utilized   tubing/devices free from skin contact  Intervention: Prevent and Manage VTE (Venous Thromboembolism) Risk  Recent Flowsheet Documentation  Taken 03/20/2020 2000 by Carin Hock, RN  Activity Management: activity adjusted per tolerance  VTE Prevention/Management:   ambulation promoted   anticoagulant therapy  Intervention: Prevent Infection  Recent Flowsheet Documentation  Taken 03/20/2020 2000 by Carin Hock, RN  Infection Prevention:   visitors restricted/screened   single patient room provided   rest/sleep promoted   personal protective equipment utilized   hand hygiene promoted   equipment surfaces disinfected   environmental surveillance performed  Goal: Optimal Comfort and Wellbeing  Outcome: Progressing  Goal: Readiness for Transition of Care  Outcome: Progressing  Goal: Rounds/Family Conference  Outcome: Progressing

## 2020-03-21 NOTE — Unmapped (Signed)
Pt remains in the CCU. A and O x 2. No PRN ativan given today. CIWA scores have been low. Pt able to ambulate to bathroom with FWW. On RA. VSS. Floor status. Tele health visit with psych today. Tolerating a diet. Voiding appropriately. 1 Bm today       Problem: Fall Injury Risk  Goal: Absence of Fall and Fall-Related Injury  Outcome: Ongoing - Unchanged  Intervention: Promote Injury-Free Environment  Recent Flowsheet Documentation  Taken 03/20/2020 0800 by Westley Foots, RN  Safety Interventions: bed alarm     Problem: Asthma Comorbidity  Goal: Maintenance of Asthma Control  Outcome: Ongoing - Unchanged     Problem: Behavioral Health Comorbidity  Goal: Maintenance of Behavioral Health Symptom Control  Outcome: Ongoing - Unchanged     Problem: COPD (Chronic Obstructive Pulmonary Disease) Comorbidity  Goal: Maintenance of COPD Symptom Control  Outcome: Ongoing - Unchanged

## 2020-03-22 ENCOUNTER — Inpatient Hospital Stay: Admit: 2020-03-22 | Payer: MEDICAID

## 2020-03-22 LAB — BASIC METABOLIC PANEL
ANION GAP: 8 mmol/L (ref 5–14)
BLOOD UREA NITROGEN: 9 mg/dL (ref 9–23)
BUN / CREAT RATIO: 13
CALCIUM: 9.3 mg/dL (ref 8.7–10.4)
CHLORIDE: 100 mmol/L (ref 98–107)
CO2: 24.7 mmol/L (ref 20.0–31.0)
CREATININE: 0.69 mg/dL
EGFR CKD-EPI AA MALE: >90 mL/min/{1.73_m2} (ref >=60)
EGFR CKD-EPI NON-AA MALE: >90 mL/min/{1.73_m2} (ref >=60)
GLUCOSE RANDOM: 101 mg/dL (ref 70–179)
POTASSIUM: 3.9 mmol/L (ref 3.4–4.8)
SODIUM: 133 mmol/L — ABNORMAL LOW (ref 135–145)

## 2020-03-22 MED ADMIN — enoxaparin (LOVENOX) syringe 40 mg: 40 mg | SUBCUTANEOUS | @ 02:00:00

## 2020-03-22 MED ADMIN — pantoprazole (PROTONIX) EC tablet 20 mg: 20 mg | ORAL | @ 18:00:00

## 2020-03-22 MED ADMIN — cetirizine (ZyrTEC) tablet 10 mg: 10 mg | ORAL | @ 13:00:00

## 2020-03-22 MED ADMIN — gabapentin (NEURONTIN) capsule 200 mg: 200 mg | ORAL | @ 02:00:00

## 2020-03-22 MED ADMIN — nicotine (NICODERM CQ) 21 mg/24 hr patch 1 patch: 1 | TRANSDERMAL | @ 13:00:00

## 2020-03-22 MED ADMIN — naltrexone (DEPADE) tablet 50 mg: 50 mg | ORAL | @ 13:00:00

## 2020-03-22 MED ADMIN — amLODIPine (NORVASC) tablet 10 mg: 10 mg | ORAL | @ 13:00:00

## 2020-03-22 MED ADMIN — OLANZapine (ZYPREXA) tablet 20 mg: 20 mg | ORAL | @ 02:00:00

## 2020-03-22 MED ADMIN — divalproex ER (DEPAKOTE ER) extended release 24 hr tablet 750 mg: 750 mg | ORAL | @ 02:00:00

## 2020-03-22 MED ADMIN — folic acid (FOLVITE) tablet 1 mg: 1 mg | ORAL | @ 13:00:00

## 2020-03-22 NOTE — Unmapped (Signed)
Adult Nutrition Assessment Note    Visit Type: RN Consult  Reason for Visit: Per Admission Nutrition Screen (Adult),Have you gained or lost 10 pounds in the past 3 months?,Have you had a decrease in food intake or appetite?      HPI & PMH:  Nicholas Rush is a 55 y.o. male with a past medical history significant for Bipolar Disorder type 1, alcohol use disorder with hx of complicated withdrawal, HTN, psoriasis on adalimumab, and chronic hyponatermia who presents with altered mental status in the setting of hyponatremia, alcohol withdrawal.   ??    Anthropometric Data:  Height: 185.4 cm (6' 1)   Admission weight: 89.1 kg (196 lb 6.9 oz)  Last recorded weight: 89.4 kg (197 lb 1.5 oz)  IBW: 83.51 kg  Percent IBW: 107.05 %  BMI: Body mass index is 26 kg/m??.   Usual Body Weight: Patient is unsure    Weight history prior to admission: No significant wt loss noted   Wt Readings from Last 10 Encounters:   03/19/20 89.4 kg (197 lb 1.5 oz)   03/09/20 90.4 kg (199 lb 3 oz)   02/23/20 83.9 kg (184 lb 15.5 oz)   01/17/20 83.9 kg (185 lb)   01/15/20 86.6 kg (191 lb)   12/13/19 84.7 kg (186 lb 11.2 oz)   12/11/19 85 kg (187 lb 6.4 oz)   11/30/19 81.3 kg (179 lb 3.7 oz)   07/03/19 81.6 kg (180 lb)   06/09/19 85.9 kg (189 lb 6 oz)        Weight changes this admission:   Last 5 Recorded Weights    03/19/20 0200 03/19/20 1246   Weight: 89.1 kg (196 lb 6.9 oz) 89.4 kg (197 lb 1.5 oz)        Nutrition Focused Physical Exam:  Nutrition Focused Physical Exam:                        Nutrition Evaluation  Overall Impressions: Nutrition-Focused Physical Exam not indicated due to lack of malnutrition risk factors. (03/22/20 1148)  Nutrition Designation: Overweight (BMI 25.00 - 29.99 kg/m2) (03/22/20 1148)      NUTRITIONALLY RELEVANT DATA     Medications:   Nutritionally pertinent medications reviewed and evaluated for potential food and/or medication interactions and include Folic acid    Labs:   Nutritionally pertinent labs reviewed and include Na: 133 mmol/L    Nutrition History:   March 22, 2020: Prior to admission: Patient reports a good appetite and PO intake PTA and at present. He was recently discharged from hospital and denies any changes as far as nutrition goes since last RD visit. Per observation, pt consumed 75% of breakfast tray. Denies any N/V or chewing/swallowing issues. He requested Ensure supplements. Pt is unsure of UBW.  Wt hx in EMR does not reveal a loss. Per EMR, 100%, 75% of last 2 documented meals consumed.     Allergies, Intolerances, Sensitivities, and/or Cultural/Religious Dietary Restrictions: none identified per chart review at this time     Current Nutrition:  Oral intake        Nutrition Orders   (From admission, onward)             Start     Ordered    03/18/20 1947  Nutrition Therapy Regular/House  Effective now        Question:  Nutrition Therapy:  Answer:  Regular/House    03/18/20 1946  Nutritional Needs:   Healthy balance of carbohydrate, protein, and fat.       Malnutrition Assessment using AND/ASPEN Clinical Characteristics:    Patient does not meet AND/ASPEN criteria for malnutrition at this time (03/22/20 1148)       GOALS and EVALUATION     ??? Patient to consume 75% or greater of po intake via combination of meals, snacks, and/or oral supplements within 3-5 days.  - New    Motivation, Barriers, and Compliance:  Evaluation of motivation, barriers, and compliance completed. No concerns identified at this time.     NUTRITION ASSESSMENT     ??? Current nutrition therapy is appropriate and likely meeting meeting nutritional needs at this time.    ??? Folic acid supplementation noted and is appropriate given alcohol use disorder      Discharge Planning:   Monitor via CAPP rounds for any discharge planning needs.      NUTRITION INTERVENTIONS and RECOMMENDATION     1. Continue with current diet: Regular  2. Recommend a daily multivitamin with minerals   3. Will add Ensure HP once daily (per patients request)  4. Weekly weights  5. No nutrition concerns to address at this time    Follow-Up Parameters:   Signing off at this time (Please reconsult if needed)    Thank you for allowing me to participate in the care of this patient. Please feel free to page with any questions or concerns.    Theadora Rama, MS, RD, LDN, CDCES  Pager#: [phone]

## 2020-03-22 NOTE — Unmapped (Addendum)
Pharmacist Discharge Note  Patient Name: Nicholas Rush  Reason for admission: AMS in the setting of hypnatremia  Reason for writing this note: patient requires medication-related outpatient intervention and/or monitoring    Highlighted medication changes with rationale (if applicable):  Discontinued medications:  - lithium - AMS thought to be secondary to lithium toxicity  - lisinopril    New Medications  - depakote ER 750mg  qhs (of note: historically patient was on depakote 500mg  BID)  - amlodipine 10mg  once daily    Medication access:  - No barriers identified    Outpatient follow-up:  [ ]  plan to obtain Na and VPA levels as outpatient 03/09 and 03/16    Cherlynn Polo   Clinical Pharmacist    Future Appointments   Date Time Provider Department Center   04/05/2020 11:00 AM Christoper Allegra, PT [facility]   04/05/2020 12:30 PM Bernadette Hoit, OT [facility]   04/11/2020  3:00 PM Ulla Gallo, MD [facility]   04/22/2020  9:00 AM [name], DO [facility]   04/29/2020  9:15 AM Will Karsten Fells, MD [facility]   05/16/2020  1:00 PM Durenda Hurt, MD [facility]   06/03/2020 10:30 AM Will Karsten Fells, MD [facility]   07/08/2020  9:15 AM Will Karsten Fells, MD [facility]

## 2020-03-22 NOTE — Unmapped (Signed)
Valproate and Derivatives Therapeutic Monitoring Pharmacy Note    Nicholas Rush is a 55 y.o. male starting valproic acid.    Indication: Mood stabilization    Prior Dosing Information: Current regimen 750 mg ER qhs    Goals:  Therapeutic Drug Levels  50 - 125 mcg/mL (mood stabilization).    Additional clinical monitoring/outcomes:   Pregnancy status (where indicated), CBC with differential (platelets/ANC), sodium level, hepatic function, amylase/lipase, ammonia level with encephalopathy    Results: Not applicable    Pharmacokinetic Information/Significant Interacting Medications:  ??? Interacting medications: none  ??? Lamotrigine: Valproate derivatives are known to increase lamotrigine concentrations. Recommended dose reduction and titration protocols are provided in the package insert  ??? Lorazepam: Valproate derivatives are known to increase serum exposure to lorazepam. Monitoring for lorazepam over-exposure and dose reductions may be required.     Assessment/Plan:  Recommendation(s)   Continue current dose of depakote 750mg  ER qhs    Follow-up  ??? Valproic acid level: pending 3/7.   ??? Steady-state trough serum valproic acid concentrations should be obtained within 3 - 5 days after initiation, dosing changes or addition of interacting medications.   ??? Male patients of child-bearing age: a daily supplemental folic acid dose of 1 - 4 mg (1000 - 4000 micrograms) is generally recommended for the prevention of neural tube defects (NTD) in male patients of child-bearing age receiving valproate    Formulation-specific Valproic Acid Derivative Monitoring Recommendations:  ??? Divalproex Delayed-release or Valproate Immediate-release (twice daily dosing): obtain serum concentrations 12 hours after the preceding dose  ??? Divalproex Extended-release (nightly dosing): obtain serum concentrations at least 18 hours after preceding dose  ??? Divalproex Extended-release (morning dosing): obtain serum concentrations as a 24-hr trough, immediately prior to next dose    Pharmacy will continue to follow patient with team an recommend levels as appropriate.    Cherlynn Polo, PharmD

## 2020-03-22 NOTE — Unmapped (Signed)
Family Medicine Inpatient Service    Progress Note    Team: Family [facility] (pgr [phone])    Hospital Day: 3    ASSESSMENT / PLAN:   Nicholas Rush is a 55 y.o. male with a past medical history significant for  Bipolar Disorder type 1, alcohol use disorder with hx of complicated withdrawal, HTN, psoriasis on adalimumab, and chronic hyponatermia who presents with altered mental status in the setting of hyponatremia, now improving.   ??  # Encephalopathy, improving  # hx Seizures  Improving, likely 2/2 to lithium and hyponatremia.  - Hold lithium  - Hyponatremia managed as below    # Acute on Chronic Hyponatremia  # Hx of SIADH   Resumed depakote, received first dose last night. Will continue to monitor Na daily with restart.  - Daily BMP, likely goal Na 125-130  - Appreciate psych's assistance with med management in setting of hyponatremia and bipolar disorder  -  If unable to maintain Na, consider restart salt tablets 1g with meals  ??  # Bipolar 1 Disorder vs other  recent hospitalization with Psychiatry for transition from Depakote to Lithium given hyponatremia. Has had ~5 psych hospitalizations since November. Restarted depakote on 3/3 w/ psychiatry's assistance d/t family's concerns with monotherapy.  - Continue depakote  - Depakote level 3/6  - Continue Zyprexa 20mg  at bedtime  - Gabapentin at bedtime  - Apprec psych's recs  ??  #HTN: amlodipine 10 mg daily  ??  #Psoriasis??-??psoriatic arthritis: hold home [place]   ??  # Tobacco use disorder: Patient currently smoking 1 PPD.   - Tobacco cessation consult  - Nicotine replacement therapy with patch, gum, lozenge PRN      # Checklist:  - IVF None  - Tubes/Lines/Drains: PIV x2  - Diet Regular  - Bowel Regimen: No indication for a bowel regimen at this time  - DVT: SQ Lovenox  - Code Status:   Orders Placed This Encounter   Procedures   ??? Full Code     Standing Status:   Standing     Number of Occurrences:   1     - Dispo: Floor    [ ]  Anticipated Discharge Location: Home  [ ]  PT/OT/DME: No needs anticipated  [ ]  CM/SW needs: None anticipated  [ ]  Meds/Rx:  Not yet prescribed. No special med needs  [ ]  Teaching: None anticipated  [ ]  Follow up appt: Appointment needed  [ ]  Excuse letter: None anticipated  [ ]  Transport: Private Needed    SUBJECTIVE:  Interval events: No acute events overnight      REVIEW OF SYSTEMS:  Pertinent positives and negatives per HPI. A complete review of systems otherwise negative.    PHYSICAL EXAM:      Intake/Output Summary (Last 24 hours) at 03/22/2020 1155  Last data filed at 03/22/2020 0410  Gross per 24 hour   Intake ???   Output 800 ml   Net -800 ml       Recent Vitals:  Vitals:    03/22/20 0406   BP: 134/90   Pulse: 87   Resp: 16   Temp: 36.4 ??C   SpO2: 99%       GEN: Tired appearing, lying in bed, NAD   HEENT: NCAT, No scleral icterus. Conjunctiva non-erythematous. MMM.  CV: Regular rate and rhythm. No murmurs/rubs/gallops.  Pulm: Normal work of breathing on RA. CTAB.  Abd: Flat.  Nontender. No guarding, rebound.   Neuro: A&O x  3. No focal deficits.  Ext: No peripheral edema.  Palpable distal pulses.  Skin: No rashes or skin lesions.       LABS/ STUDIES:    All imaging, laboratory studies, and other pertinent tests including electrocardiography within the last 24 hours were reviewed and are summarized within the assessment and plan.     NUTRITION:  Malnutrition Evaluation as performed by RD, LDN: Patient does not meet AND/ASPEN criteria for malnutrition at this time (03/22/20 1148)                        Angelita Ingles, MD PGY2

## 2020-03-23 DIAGNOSIS — G9340 Encephalopathy, unspecified: Secondary | ICD-10-CM | POA: Insufficient documentation

## 2020-03-23 LAB — BASIC METABOLIC PANEL
ANION GAP: 9 mmol/L (ref 5–14)
BLOOD UREA NITROGEN: 12 mg/dL (ref 9–23)
BUN / CREAT RATIO: 19
CALCIUM: 9.2 mg/dL (ref 8.7–10.4)
CHLORIDE: 100 mmol/L (ref 98–107)
CO2: 22.3 mmol/L (ref 20.0–31.0)
CREATININE: 0.62 mg/dL
EGFR CKD-EPI AA MALE: >90 mL/min/{1.73_m2} (ref >=60)
EGFR CKD-EPI NON-AA MALE: >90 mL/min/{1.73_m2} (ref >=60)
GLUCOSE RANDOM: 135 mg/dL (ref 70–179)
POTASSIUM: 3.8 mmol/L (ref 3.4–4.8)
SODIUM: 131 mmol/L — ABNORMAL LOW (ref 135–145)

## 2020-03-23 MED ADMIN — polyethylene glycol (MIRALAX) packet 17 g: 17 g | ORAL | @ 19:00:00

## 2020-03-23 MED ADMIN — nicotine (NICODERM CQ) 21 mg/24 hr patch 1 patch: 1 | TRANSDERMAL | @ 15:00:00

## 2020-03-23 MED ADMIN — fluticasone propionate (FLONASE) 50 mcg/actuation nasal spray 2 spray: 2 | NASAL | @ 15:00:00

## 2020-03-23 MED ADMIN — OLANZapine (ZYPREXA) tablet 20 mg: 20 mg | ORAL | @ 02:00:00

## 2020-03-23 MED ADMIN — gabapentin (NEURONTIN) capsule 200 mg: 200 mg | ORAL | @ 02:00:00

## 2020-03-23 MED ADMIN — pantoprazole (PROTONIX) EC tablet 20 mg: 20 mg | ORAL | @ 15:00:00

## 2020-03-23 MED ADMIN — naltrexone (DEPADE) tablet 50 mg: 50 mg | ORAL | @ 15:00:00

## 2020-03-23 MED ADMIN — melatonin tablet 3 mg: 3 mg | ORAL | @ 02:00:00

## 2020-03-23 MED ADMIN — cetirizine (ZyrTEC) tablet 10 mg: 10 mg | ORAL | @ 15:00:00

## 2020-03-23 MED ADMIN — enoxaparin (LOVENOX) syringe 40 mg: 40 mg | SUBCUTANEOUS | @ 02:00:00

## 2020-03-23 MED ADMIN — divalproex ER (DEPAKOTE ER) extended release 24 hr tablet 750 mg: 750 mg | ORAL | @ 02:00:00

## 2020-03-23 MED ADMIN — nicotine polacrilex (NICORETTE) gum 4 mg: 4 mg | BUCCAL | @ 21:00:00

## 2020-03-23 MED ADMIN — folic acid (FOLVITE) tablet 1 mg: 1 mg | ORAL | @ 15:00:00

## 2020-03-23 NOTE — Unmapped (Signed)
Progressing.NAD noted at this time.Encouraged to call for help when in need.Will keep monitoring.  Problem: Fall Injury Risk  Goal: Absence of Fall and Fall-Related Injury  Outcome: Progressing  Intervention: Promote Injury-Free Environment  Recent Flowsheet Documentation  Taken 03/22/2020 2000 by Wonda Olds, RN  Safety Interventions:  ??? bed alarm  ??? fall reduction program maintained  ??? lighting adjusted for tasks/safety  ??? low bed     Problem: Pain Chronic (Persistent) (Comorbidity Management)  Goal: Acceptable Pain Control and Functional Ability  Outcome: Progressing     Problem: Skin Injury Risk Increased  Goal: Skin Health and Integrity  Outcome: Progressing

## 2020-03-23 NOTE — Unmapped (Signed)
Patient alert and oriented x4. Bed in low, locked position. Call bell and phone within reach. No complaints of pain. Tolerated medications well. VSS. Will continue to monitor.     Problem: Fall Injury Risk  Goal: Absence of Fall and Fall-Related Injury  Outcome: Ongoing - Unchanged  Intervention: Promote Injury-Free Environment  Recent Flowsheet Documentation  Taken 03/22/2020 0800 by Rolly Salter, RN  Safety Interventions:   fall reduction program maintained   low bed     Problem: Asthma Comorbidity  Goal: Maintenance of Asthma Control  Outcome: Ongoing - Unchanged     Problem: Behavioral Health Comorbidity  Goal: Maintenance of Behavioral Health Symptom Control  Outcome: Ongoing - Unchanged     Problem: COPD (Chronic Obstructive Pulmonary Disease) Comorbidity  Goal: Maintenance of COPD Symptom Control  Outcome: Ongoing - Unchanged     Problem: Diabetes Comorbidity  Goal: Blood Glucose Level Within Targeted Range  Outcome: Ongoing - Unchanged     Problem: Heart Failure Comorbidity  Goal: Maintenance of Heart Failure Symptom Control  Outcome: Ongoing - Unchanged     Problem: Hypertension Comorbidity  Goal: Blood Pressure in Desired Range  Outcome: Ongoing - Unchanged     Problem: Obstructive Sleep Apnea Risk or Actual Comorbidity Management  Goal: Unobstructed Breathing During Sleep  Outcome: Ongoing - Unchanged     Problem: Osteoarthritis Comorbidity  Goal: Maintenance of Osteoarthritis Symptom Control  Outcome: Ongoing - Unchanged     Problem: Seizure Disorder Comorbidity  Goal: Maintenance of Seizure Control  Outcome: Ongoing - Unchanged     Problem: Skin Injury Risk Increased  Goal: Skin Health and Integrity  Outcome: Ongoing - Unchanged     Problem: Adult Inpatient Plan of Care  Goal: Plan of Care Review  Outcome: Ongoing - Unchanged  Goal: Patient-Specific Goal (Individualized)  Outcome: Ongoing - Unchanged  Goal: Absence of Hospital-Acquired Illness or Injury  Outcome: Ongoing - Unchanged  Intervention: Identify and Manage Fall Risk  Recent Flowsheet Documentation  Taken 03/22/2020 0800 by Rolly Salter, RN  Safety Interventions:   fall reduction program maintained   low bed  Goal: Optimal Comfort and Wellbeing  Outcome: Ongoing - Unchanged  Goal: Readiness for Transition of Care  Outcome: Ongoing - Unchanged  Goal: Rounds/Family Conference  Outcome: Ongoing - Unchanged

## 2020-03-23 NOTE — Unmapped (Signed)
Family Medicine Inpatient Service    Progress Note    Team: Family [facility] (pgr [phone])    Hospital Day: 4    ASSESSMENT / PLAN:   Nicholas Rush is a 55 y.o. male with a past medical history significant for  Bipolar Disorder type 1, alcohol use disorder with hx of complicated withdrawal, HTN, psoriasis on adalimumab, and chronic hyponatermia who presented on 03/18/20 with altered mental status in the setting of hyponatremia.    # Bipolar 1 Disorder vs other - Encephalopathy (resolving): Depakote restarted on 03/21/20 per Psych Recs. Mood and affect have been appropriate since transition from lithium. We will plan to collect a Depkaote level tomorrow, 03/24/20. Nicholas Rush would likely benefit from outpatient psych f/u.  Management plan  - Depakote level 3/6  - Apprec psych's recs  Treatment plan  - continue depakote 750mg  qHS  - Continue Zyprexa 20mg  qHS  - Gabapentin qHS    # Acute on Chronic Hyponatremia - Hx of SIADH: Sodium 131 on check this morning, 03/23/20, after restarting depakote on 03/21/20.   - Daily BMP, likely goal Na 125-130  - Appreciate psych's assistance with med management in setting of hyponatremia and bipolar disorder  -  If unable to maintain Na, consider restart salt tablets 1g with meals    Chronic Medical Conditions  # Tobacco use disorder: Patient currently smoking 1 PPD.   - Tobacco cessation consult  - Nicotine replacement therapy with patch, gum, lozenge PRN    # HTN: amlodipine 10 mg daily  # Psoriasis??-??psoriatic arthritis: hold home [place]   ??      # Checklist:  - IVF None  - Tubes/Lines/Drains: PIV x2  - Diet Regular  - Bowel Regimen: Miralax 17 g daily  - DVT: SQ Lovenox  - Code Status:   Orders Placed This Encounter   Procedures   ??? Full Code     Standing Status:   Standing     Number of Occurrences:   1     - Dispo: Floor    [ ]  Anticipated Discharge Location: Home  [ ]  PT/OT/DME: No needs anticipated  [ ]  CM/SW needs: None anticipated  [ ]  Meds/Rx:  Not yet prescribed. No special med needs  [ ]  Teaching: None anticipated  [ ]  Follow up appt: Appointment needed  [ ]  Excuse letter: None anticipated  [ ]  Transport: Private Needed    SUBJECTIVE:  Interval events: Reports dissatisfactoin in being woken up around 3:30 AM this morning. States that he has been unable to fall asleep since that time. He experienced some nausea over night, which has since resolved.       REVIEW OF SYSTEMS:  Pertinent positives and negatives per HPI. A complete review of systems otherwise negative.    PHYSICAL EXAM:      Intake/Output Summary (Last 24 hours) at 03/23/2020 0611  Last data filed at 03/23/2020 0330  Gross per 24 hour   Intake 840 ml   Output 950 ml   Net -110 ml       Recent Vitals:  Vitals:    03/23/20 0328   BP: 137/86   Pulse: 72   Resp: 16   Temp: 36.6 ??C   SpO2: 99%       GEN: Tired appearing, lying in bed, NAD, trash can at bedside  HEENT: NCAT, No scleral icterus. Conjunctiva non-erythematous. MMM.  CV: Regular rate and rhythm. No murmurs/rubs/gallops.  Pulm: Normal work of breathing on RA.  CTAB.  Abd: Flat.  Nontender. No guarding, rebound.   Neuro: A&O x 3. No focal deficits.  Ext: No peripheral edema.  Palpable distal pulses.  Skin: No rashes or skin lesions on exposed skin       LABS/ STUDIES:    All imaging, laboratory studies, and other pertinent tests including electrocardiography within the last 24 hours were reviewed and are summarized within the assessment and plan.     NUTRITION:  Malnutrition Evaluation as performed by RD, LDN: Patient does not meet AND/ASPEN criteria for malnutrition at this time (03/22/20 1148)                        Ronney Asters, MD PGY2

## 2020-03-24 LAB — BASIC METABOLIC PANEL
ANION GAP: 8 mmol/L (ref 5–14)
BLOOD UREA NITROGEN: 14 mg/dL (ref 9–23)
BUN / CREAT RATIO: 20
CALCIUM: 9.6 mg/dL (ref 8.7–10.4)
CHLORIDE: 100 mmol/L (ref 98–107)
CO2: 25.7 mmol/L (ref 20.0–31.0)
CREATININE: 0.7 mg/dL
EGFR CKD-EPI AA MALE: >90 mL/min/{1.73_m2} (ref >=60)
EGFR CKD-EPI NON-AA MALE: >90 mL/min/{1.73_m2} (ref >=60)
GLUCOSE RANDOM: 104 mg/dL (ref 70–179)
POTASSIUM: 4 mmol/L (ref 3.4–4.8)
SODIUM: 134 mmol/L — ABNORMAL LOW (ref 135–145)

## 2020-03-24 LAB — VALPROIC ACID LEVEL, TOTAL: VALPROIC ACID TOTAL: 45.8 ug/mL — ABNORMAL LOW (ref 50.0–100.0)

## 2020-03-24 MED ORDER — MELATONIN 3 MG TABLET
ORAL_TABLET | Freq: Every evening | ORAL | 0 refills | 30 days | Status: CP
Start: 2020-03-24 — End: 2020-04-23

## 2020-03-24 MED ORDER — DIVALPROEX ER 250 MG TABLET,EXTENDED RELEASE 24 HR
ORAL_TABLET | Freq: Every evening | ORAL | 0 refills | 30 days | Status: CP
Start: 2020-03-24 — End: 2020-04-23

## 2020-03-24 MED ADMIN — naltrexone (DEPADE) tablet 50 mg: 50 mg | ORAL | @ 14:00:00 | Stop: 2020-03-24

## 2020-03-24 MED ADMIN — enoxaparin (LOVENOX) syringe 40 mg: 40 mg | SUBCUTANEOUS | @ 02:00:00

## 2020-03-24 MED ADMIN — cetirizine (ZyrTEC) tablet 10 mg: 10 mg | ORAL | @ 14:00:00 | Stop: 2020-03-24

## 2020-03-24 MED ADMIN — gabapentin (NEURONTIN) capsule 200 mg: 200 mg | ORAL | @ 14:00:00 | Stop: 2020-03-24

## 2020-03-24 MED ADMIN — folic acid (FOLVITE) tablet 1 mg: 1 mg | ORAL | @ 14:00:00 | Stop: 2020-03-24

## 2020-03-24 MED ADMIN — polyethylene glycol (MIRALAX) packet 17 g: 17 g | ORAL | @ 14:00:00 | Stop: 2020-03-24

## 2020-03-24 MED ADMIN — nicotine (NICODERM CQ) 21 mg/24 hr patch 1 patch: 1 | TRANSDERMAL | @ 14:00:00 | Stop: 2020-03-24

## 2020-03-24 MED ADMIN — divalproex ER (DEPAKOTE ER) extended release 24 hr tablet 750 mg: 750 mg | ORAL | @ 02:00:00

## 2020-03-24 MED ADMIN — melatonin tablet 3 mg: 3 mg | ORAL | @ 02:00:00

## 2020-03-24 MED ADMIN — OLANZapine (ZYPREXA) tablet 20 mg: 20 mg | ORAL | @ 02:00:00

## 2020-03-24 MED ADMIN — pantoprazole (PROTONIX) EC tablet 20 mg: 20 mg | ORAL | @ 14:00:00 | Stop: 2020-03-24

## 2020-03-24 MED ADMIN — gabapentin (NEURONTIN) capsule 200 mg: 200 mg | ORAL | @ 02:00:00

## 2020-03-24 MED ADMIN — amLODIPine (NORVASC) tablet 10 mg: 10 mg | ORAL | @ 14:00:00 | Stop: 2020-03-24

## 2020-03-24 MED ADMIN — fluticasone propionate (FLONASE) 50 mcg/actuation nasal spray 2 spray: 2 | NASAL | @ 14:00:00 | Stop: 2020-03-24

## 2020-03-24 NOTE — Unmapped (Signed)
Discharge summary and medications reviewed with patient with verbal understanding. Personal wc in room. Spouse will transport and pack belongings per patient request.

## 2020-03-24 NOTE — Unmapped (Signed)
Physician Discharge Summary [facility]  [facility]  [address]  [place] [address]  Dept: [phone]  Loc: [phone]     Identifying Information:   Nicholas Rush  December 09, 1965  [id number]    Primary Care Physician: Fabio Pierce, MD   Code Status: Full Code    Admit Date: 03/18/2020    Discharge Date: 03/24/2020     Discharge To: Home    Discharge Service: [facility] - [facility] [place] [facility]     Discharge Attending Physician: Soyla Murphy, MD    Discharge Diagnoses:  Principal Problem:    Encephalopathy  Resolved Problems:    * No resolved hospital problems. *      Outpatient Provider Follow Up Issues:   [ ]  Weekly Na+ level and Depakote level: 3/9 and 3/16    Hospital Course:   Nicholas Rush is a 55 y.o. male with a past medical history significant for  Bipolar Disorder type 1, alcohol use disorder with hx of complicated withdrawal, HTN, psoriasis on adalimumab, and chronic hyponatermia who presents with altered mental status in the setting of hyponatremia, now improving.      # Encephalopathy - Bipolar Disorder - H/o Seizures  Ddx for AMS included medication side effect vs hyponatremia.  Patient recently was changed from Depakote to lithium the week prior to admission.  Has a history of hyponatremia and was on home salt tabs.  Discussed with psych who state possibility that these medications were causing SIADH and salt tabs not replacing as needed.  Held lithium during hospitalization and hyponatremia improved along with mental status.  Was evaluated by psych, who originally recommended monotherapy w/ olanzapine. However, d/t family's concerns for safety and well-being the decision was made to restart depakote while inpatient. He was monitored for 3 days w/ daily BMP. His Na remained stable and was 134 on day of discharge. He was discharged with plan for weekly Na and Depakote levels to bridge pt to his psychiatry appt for depakote titration. He will be discharged with very close follow-up with his PCP on 3/7.    # Acute on Chronic Hyponatremia - SIADH  Resolved w/ discontinuation of Lithium and fluid restriction. Remained stable w/ Depakote restart. Will continue to monitor Na+ weekly until his psychiatry follow-up.    # HTN: Discontinued lisinopril and replaced with amlodipine.      COVID-19 Vaccination: Needs Booster    Procedures:  None  No admission procedures for hospital encounter.  ______________________________________________________________________  Discharge Medications:     Your Medication List      STOP taking these medications    lisinopriL 20 MG tablet  Commonly known as: PRINIVIL,ZESTRIL     lithium 300 MG ER tablet  Commonly known as: LITHOBID        START taking these medications    amLODIPine 10 MG tablet  Commonly known as: NORVASC  Take 1 tablet (10 mg total) by mouth daily.  Start taking on: March 25, 2020     divalproex ER 250 MG extended release 24 hr tablet  Commonly known as: DEPAKOTE ER  Take 3 tablets (750 mg total) by mouth nightly.     melatonin 3 mg Tab  Take 1 tablet (3 mg total) by mouth every evening.        CONTINUE taking these medications    acetaminophen 500 MG tablet  Commonly known as: TYLENOL  Take 500 mg by mouth every six (6) hours as needed for pain.  CALCIUM 500 + D ORAL  Take 1 tablet by mouth daily.     cetirizine 10 MG tablet  Commonly known as: ZyrTEC  Take 10 mg by mouth daily.     fluticasone propionate 50 mcg/actuation nasal spray  Commonly known as: FLONASE  2 sprays into each nostril daily.     folic acid 1 MG tablet  Commonly known as: FOLVITE  Take 1 tablet (1 mg total) by mouth daily.     gabapentin 100 MG capsule  Commonly known as: NEURONTIN  Take 2 capsules (200 mg total) by mouth Three (3) times a day.     HUMIRA(CF) 40 mg/0.4 mL injection  Generic drug: adalimumab  Inject the contents of 1 syringe (40 mg) under the skin every 14 days     magnesium oxide 400 mg (241.3 mg elemental) tablet  Commonly known as: MAG-OX  Take 2 tablets (800 mg total) by mouth daily. naltrexone 50 mg tablet  Commonly known as: DEPADE  Take 1 tablet (50 mg total) by mouth daily.     nicotine 21 mg/24 hr patch  Commonly known as: NICODERM CQ  Place 1 patch on the skin daily.     nicotine polacrilex 4 MG gum  Commonly known as: NICORETTE  Apply 1 each (4 mg total) to cheek every hour as needed for smoking cessation.     OLANZapine 20 MG tablet  Commonly known as: ZyPREXA  Take 1 tablet (20 mg total) by mouth nightly.     omeprazole 20 MG capsule  Commonly known as: PriLOSEC  TAKE 1 CAPSULE(20 MG) BY MOUTH EVERY DAY     SENNA LAX ORAL  Take 1 tablet by mouth daily as needed (constipation).     sodium chloride 1 gram tablet  Take 1 tablet (1 g total) by mouth Three (3) times a day with a meal.     THERA-M Tab  Generic drug: multivitamin,tx-iron-minerals  Take 1 tablet by mouth daily.     thiamine 100 MG tablet  Commonly known as: B-1  Take 1 tablet (100 mg total) by mouth daily.     TUMS 200 mg calcium (500 mg) chewable tablet  Generic drug: calcium carbonate  Chew 2 tablets daily as needed for heartburn.            Allergies:  Tramadol and Hydroxyzine  ______________________________________________________________________  Pending Test Results (if blank, then none):      Most Recent Labs:  All lab results last 24 hours -   Recent Results (from the past 24 hour(s))   Basic Metabolic Panel    Collection Time: 03/24/20  9:01 AM   Result Value Ref Range    Sodium 134 (L) 135 - 145 mmol/L    Potassium 4.0 3.4 - 4.8 mmol/L    Chloride 100 98 - 107 mmol/L    CO2 25.7 20.0 - 31.0 mmol/L    Anion Gap 8 5 - 14 mmol/L    BUN 14 9 - 23 mg/dL    Creatinine 1.61 0.96 - 1.10 mg/dL    BUN/Creatinine Ratio 20     EGFR CKD-EPI Non-African American, Male >90 >=60 mL/min/1.29m2    EGFR CKD-EPI African American, Male >90 >=60 mL/min/1.66m2    Glucose 104 70 - 179 mg/dL    Calcium 9.6 8.7 - 04.5 mg/dL   Valproic Acid Level    Collection Time: 03/24/20  9:01 AM   Result Value Ref Range    Valproic Acid, Total 45.8 (L) 50.0 - 100.0  ug/mL       Relevant Studies/Radiology (if blank, then none):  ECG 12 lead (Adult)    Result Date: 03/18/2020  NORMAL SINUS RHYTHM NORMAL ECG WHEN COMPARED WITH ECG OF 03-Jan-2020 12:08, NO SIGNIFICANT CHANGE WAS FOUND Confirmed by Freeman Caldron ([id number]) on 03/18/2020 1:06:50 PM    XR Chest Portable    Result Date: 03/19/2020  EXAM: XR CHEST PORTABLE DATE: 03/19/2020 7:30 AM ACCESSION: [id number] UN DICTATED: 03/19/2020 8:42 AM INTERPRETATION LOCATION: [facility] CLINICAL INDICATION: 55 years old Male with ALTERED MENTAL STATUS  COMPARISON: Chest radiograph 03/23/2020 TECHNIQUE: Portable Chest Radiograph. FINDINGS: The lungs are mildly inflated. Streaky bibasilar opacities, likely atelectasis. No pleural effusion or pneumothorax. Cardiomediastinal silhouette is within normal limits.     No acute airspace disease.    CT Head Wo Contrast    Result Date: 03/18/2020  EXAM: Computed tomography, head or brain without contrast material. DATE: 03/18/2020 1:03 PM ACCESSION: [id number] UN DICTATED: 03/18/2020 1:21 PM INTERPRETATION LOCATION: [facility] CLINICAL INDICATION: 55 years old Male with ams ; Mental status change, unknown cause  COMPARISON: CT head 02/23/2020 and priors, MRI brain 11/30/2019. TECHNIQUE: Axial CT images of the head  from skull base to vertex without contrast. FINDINGS: Mild global parenchymal volume loss. No evidence of acute intracranial hemorrhage, extra axial collections, or acute ischemia. The basal cisterns are patent. No midline shift. There are calcifications involving the carotid siphons. Mild mucosal thickening in the right maxillary sinus. There is leftward deviation of the nasal septum.     No acute appearing intracranial abnormality.    ______________________________________________________________________  Discharge Instructions:           Other Instructions     Discharge instructions      You were seen at [facility] for low sodium. You sodium has been corrected and your medications have been adjusted. Please continue to take your depakote daily and your Zyprexa at nighttime. Otherwise we stopped your lisinopril for your blood pressure and replaced it with amlodipine. Continue to take all of your other medications as prescribed. It will be extremely important to closely monitor your sodium levels over the next few weeks. You have an appointment with your PCP on 3/7. You will need to have a sodium and depakote level drawn on 3/9 and 3/16. Your psych appointment is scheduled for 3/24 to continue to adjust your psychiatric meds.      Write down your questions so you remember to ask them during your visits.  Contact your primary healthcare provider if:      You have new or worse signs and symptoms.      You have questions or concerns about your condition or care.    Seek care immediately or call 911 if:      You are sweating and have cool, clammy, pale skin.      You feel dizzy or like you are going to faint.      You have dark bowel movements, or you vomit blood.      You have a hard abdomen, or you are not able to pass gas.      You have severe pain in your abdomen that does not go away after you take medicine.      You have a very fast heartbeat, shortness of breath, and fast, shallow breathing.      You are thirsty and cold, your eyes and mouth feel dry, and you urinate little or nothing.          Resources  and Referrals     Transfer Bench (DME)      Type: Other- Specify in Comments    Length of Need: 99 mo.    Tub transfer bench         Tub transfer bench    Wheelchair      Type: Other - Specify in Comments    Options: Other- Specify in Comments    Length of Need: 99 mo    Wheelchair (Tub/shower transfer bench) - Runner, broadcasting/film/video: Transport wheelchair with removeable leg rests and drop/swing-away armrests    Ht: 73  Wt: 197#         Wheelchair (Tub/shower transfer bench) - Runner, broadcasting/film/video: Transport wheelchair with removeable leg rests and drop/swing-away armrests    Ht: 73  Wt: 197#           Follow Up instructions and Outpatient Referrals     Ambulatory referral to Home Health      Is this a [employer] or [facility] Patient?: Yes    Do you want agency provider parameter notifications or patient specific   provider parameter notifications?: Agency    Do you want to initiate remote patient monitoring?: Yes    Physician to follow patient's care: PCP    Disciplines requested:  Nursing  Physical Therapy  Occupational Therapy       Nursing requested:  Teaching/skilled observation and assessment  Other: (please enter in comments)       What teaching is needed (new diagnosis? new medications?): Medication   management and home safety assessment    Physical Therapy requested: Evaluate and treat    Occupational Therapy Requested: Evaluate and treat    Requested SOC Date: 03/25/2020    Do you want ongoing co-management?: No    Care coordination required?: No    Discharge instructions          Appointments which have been scheduled for you    Mar 25, 2020  9:00 AM  (Arrive by 8:45 AM)  RETURN INTEGRATED CARE with Rupal Doreatha Lew, MD  [facility] STEP PRIMARY CARE [place] [facility] REGION) [address]  [place] [address]  [phone]      Apr 05, 2020 11:00 AM  (Arrive by 10:45 AM)  PT NEURO EVAL with Christoper Allegra, PT  [facility] REHAB THERAPIES PT [facility] [address] [place] [facility] REGION) [phone] [place] [address]  [phone]      Apr 05, 2020 12:30 PM  (Arrive by 12:15 PM)  OT NEURO EVAL with Bernadette Hoit, OT  [facility] REHAB THERAPIES OT [facility] BLVD [facility] REGION) [address] [place] [address]  [phone]      Apr 11, 2020  3:00 PM  (Arrive by 2:50 PM)  RETURN  STEP with Ulla Gallo, MD  [facility] PSYCHIATRY [facility] AT [facility] REGION) [address]  [address]  [place] [address]  [phone]      Apr 22, 2020  9:00 AM  (Arrive by 8:45 AM)  RETURN  30 with [name], DO  [facility] KIDNEY SPECIALTY [facility] [place]  [facility] REGION) [address]  [place] [address]  [phone]      Apr 29, 2020  9:15 AM  (Arrive by 9:00 AM)  RETURN  STEP with Will Karsten Fells, MD  [place] [facility]YCHIATRY STEP [place]  [facility] REGION) [address] [place] [address]  [phone]      May 16, 2020  1:00 PM  (Arrive by 12:45 PM)  NEW ADULT with Durenda Hurt, MD  [facility] INTERNAL MEDICINE [place] [facility] REGION) [address]  [address]  [place] [address]  [phone]      Jun 03, 2020 10:30 AM  (Arrive by 10:15 AM)  RETURN  STEP with Maryan Char, MD  [facility] PSYCHIATRY STEP [place] [facility]) [address] [place]  [place] [address]  [phone]      Jul 08, 2020  9:15 AM  (Arrive by 9:00 AM)  RETURN  STEP with Maryan Char, MD  [place] PSYCHIATRY STEP [place] [facility] REGION) [address] [place] [address]  [phone]           ______________________________________________________________________  Discharge Day Services:  BP 120/73  - Pulse 82  - Temp 36.8 ??C (Oral)  - Resp 18  - Ht 185.4 cm (6' 1)  - Wt 89.4 kg (197 lb 1.5 oz)  - SpO2 100%  - BMI 26.00 kg/m??   Pt seen on the day of discharge and determined appropriate for discharge.    GEN: well appearing, lying in bed, NAD  HEENT: NCAT, MMM. EOMI.  Neck: Supple.  CV: Regular rate and rhythm. No murmurs/rubs/gallops.  Pulm: CTAB. No wheezing, crackles, or rhonchi.  Abd: Flat.  Nontender. No guarding, rebound.  Normoactive bowel sounds.    Neuro: A&O x 3. No focal deficits.   Ext: No peripheral edema.  Palpable distal pulses.    Condition at Discharge: good    Length of Discharge: I spent greater than 30 mins in the discharge of this patient.

## 2020-03-24 NOTE — Unmapped (Signed)
Patient oob to chair this shift. Ambulated in hallways with wife. No c/o pain. PRN Nicorette requested and given. IV wnl. Falls precautions maintained. Call bell within reach.   Problem: Fall Injury Risk  Goal: Absence of Fall and Fall-Related Injury  Outcome: Progressing  Intervention: Promote Scientist, clinical (histocompatibility and immunogenetics) Documentation  Taken 03/23/2020 1600 by Emiliano Dyer, RN  Safety Interventions:   fall reduction program maintained   infection management   low bed   nonskid shoes/slippers when out of bed  Taken 03/23/2020 1400 by Emiliano Dyer, RN  Safety Interventions:   fall reduction program maintained   infection management   low bed   nonskid shoes/slippers when out of bed  Taken 03/23/2020 1200 by Emiliano Dyer, RN  Safety Interventions:   fall reduction program maintained   infection management   low bed   nonskid shoes/slippers when out of bed  Taken 03/23/2020 1000 by Emiliano Dyer, RN  Safety Interventions:   fall reduction program maintained   low bed   nonskid shoes/slippers when out of bed  Taken 03/23/2020 0800 by Emiliano Dyer, RN  Safety Interventions:   infection management   low bed   nonskid shoes/slippers when out of bed   fall reduction program maintained     Problem: Asthma Comorbidity  Goal: Maintenance of Asthma Control  Outcome: Progressing     Problem: Behavioral Health Comorbidity  Goal: Maintenance of Behavioral Health Symptom Control  Outcome: Progressing     Problem: COPD (Chronic Obstructive Pulmonary Disease) Comorbidity  Goal: Maintenance of COPD Symptom Control  Outcome: Progressing     Problem: Diabetes Comorbidity  Goal: Blood Glucose Level Within Targeted Range  Outcome: Progressing     Problem: Heart Failure Comorbidity  Goal: Maintenance of Heart Failure Symptom Control  Outcome: Progressing     Problem: Hypertension Comorbidity  Goal: Blood Pressure in Desired Range  Outcome: Progressing     Problem: Obstructive Sleep Apnea Risk or Actual Comorbidity Management  Goal: Unobstructed Breathing During Sleep  Outcome: Progressing     Problem: Osteoarthritis Comorbidity  Goal: Maintenance of Osteoarthritis Symptom Control  Outcome: Progressing  Intervention: Maintain Osteoarthritis Symptom Control  Recent Flowsheet Documentation  Taken 03/23/2020 0800 by Emiliano Dyer, RN  Activity Management: activity adjusted per tolerance     Problem: Pain Chronic (Persistent) (Comorbidity Management)  Goal: Acceptable Pain Control and Functional Ability  Outcome: Progressing     Problem: Seizure Disorder Comorbidity  Goal: Maintenance of Seizure Control  Outcome: Progressing     Problem: Skin Injury Risk Increased  Goal: Skin Health and Integrity  Outcome: Progressing     Problem: Adult Inpatient Plan of Care  Goal: Plan of Care Review  Outcome: Progressing  Goal: Patient-Specific Goal (Individualized)  Outcome: Progressing  Goal: Absence of Hospital-Acquired Illness or Injury  Outcome: Progressing  Intervention: Identify and Manage Fall Risk  Recent Flowsheet Documentation  Taken 03/23/2020 1600 by Emiliano Dyer, RN  Safety Interventions:   fall reduction program maintained   infection management   low bed   nonskid shoes/slippers when out of bed  Taken 03/23/2020 1400 by Emiliano Dyer, RN  Safety Interventions:   fall reduction program maintained   infection management   low bed   nonskid shoes/slippers when out of bed  Taken 03/23/2020 1200 by Emiliano Dyer, RN  Safety Interventions:   fall reduction program maintained   infection management   low bed   nonskid shoes/slippers when out  of bed  Taken 03/23/2020 1000 by Emiliano Dyer, RN  Safety Interventions:   fall reduction program maintained   low bed   nonskid shoes/slippers when out of bed  Taken 03/23/2020 0800 by Emiliano Dyer, RN  Safety Interventions:   infection management   low bed   nonskid shoes/slippers when out of bed   fall reduction program maintained  Intervention: Prevent and Manage VTE (Venous Thromboembolism) Risk  Recent Flowsheet Documentation  Taken 03/23/2020 0800 by Emiliano Dyer, RN  Activity Management: activity adjusted per tolerance  Intervention: Prevent Infection  Recent Flowsheet Documentation  Taken 03/23/2020 1600 by Emiliano Dyer, RN  Infection Prevention:   environmental surveillance performed   equipment surfaces disinfected   hand hygiene promoted   personal protective equipment utilized   rest/sleep promoted   single patient room provided  Taken 03/23/2020 1400 by Emiliano Dyer, RN  Infection Prevention:   environmental surveillance performed   equipment surfaces disinfected   hand hygiene promoted   personal protective equipment utilized   rest/sleep promoted   single patient room provided  Taken 03/23/2020 1200 by Emiliano Dyer, RN  Infection Prevention:   environmental surveillance performed   equipment surfaces disinfected   hand hygiene promoted   personal protective equipment utilized   rest/sleep promoted   single patient room provided  Taken 03/23/2020 1000 by Emiliano Dyer, RN  Infection Prevention:   environmental surveillance performed   equipment surfaces disinfected   hand hygiene promoted   personal protective equipment utilized   rest/sleep promoted   single patient room provided  Taken 03/23/2020 0800 by Emiliano Dyer, RN  Infection Prevention:   environmental surveillance performed   equipment surfaces disinfected   hand hygiene promoted   personal protective equipment utilized   rest/sleep promoted   single patient room provided  Goal: Optimal Comfort and Wellbeing  Outcome: Progressing  Goal: Readiness for Transition of Care  Outcome: Progressing  Goal: Rounds/Family Conference  Outcome: Progressing     Problem: Self-Care Deficit  Goal: Improved Ability to Complete Activities of Daily Living  Outcome: Progressing

## 2020-03-24 NOTE — Unmapped (Signed)
Problem: Fall Injury Risk  Goal: Absence of Fall and Fall-Related Injury  Outcome: Progressing  Intervention: Promote Scientist, clinical (histocompatibility and immunogenetics) Documentation  Taken 03/24/2020 1030 by Lucendia Herrlich, RN  Safety Interventions: bed alarm  Taken 03/24/2020 0746 by Lucendia Herrlich, RN  Safety Interventions: low bed     Problem: Asthma Comorbidity  Goal: Maintenance of Asthma Control  Outcome: Progressing     Problem: Behavioral Health Comorbidity  Goal: Maintenance of Behavioral Health Symptom Control  Outcome: Progressing     Problem: COPD (Chronic Obstructive Pulmonary Disease) Comorbidity  Goal: Maintenance of COPD Symptom Control  Outcome: Progressing     Problem: Diabetes Comorbidity  Goal: Blood Glucose Level Within Targeted Range  Outcome: Progressing     Problem: Heart Failure Comorbidity  Goal: Maintenance of Heart Failure Symptom Control  Outcome: Progressing     Problem: Hypertension Comorbidity  Goal: Blood Pressure in Desired Range  Outcome: Progressing     Problem: Obstructive Sleep Apnea Risk or Actual Comorbidity Management  Goal: Unobstructed Breathing During Sleep  Outcome: Progressing     Problem: Osteoarthritis Comorbidity  Goal: Maintenance of Osteoarthritis Symptom Control  Outcome: Progressing     Problem: Pain Chronic (Persistent) (Comorbidity Management)  Goal: Acceptable Pain Control and Functional Ability  Outcome: Progressing     Problem: Seizure Disorder Comorbidity  Goal: Maintenance of Seizure Control  Outcome: Progressing     Problem: Skin Injury Risk Increased  Goal: Skin Health and Integrity  Outcome: Progressing     Problem: Adult Inpatient Plan of Care  Goal: Plan of Care Review  Outcome: Progressing  Goal: Patient-Specific Goal (Individualized)  Outcome: Progressing  Goal: Absence of Hospital-Acquired Illness or Injury  Outcome: Progressing  Intervention: Identify and Manage Fall Risk  Recent Flowsheet Documentation  Taken 03/24/2020 1030 by Lucendia Herrlich, RN  Safety Interventions: bed alarm  Taken 03/24/2020 0746 by Lucendia Herrlich, RN  Safety Interventions: low bed  Goal: Optimal Comfort and Wellbeing  Outcome: Progressing  Goal: Readiness for Transition of Care  Outcome: Progressing  Goal: Rounds/Family Conference  Outcome: Progressing     Problem: Self-Care Deficit  Goal: Improved Ability to Complete Activities of Daily Living  Outcome: Progressing

## 2020-03-24 NOTE — Unmapped (Signed)
Valproate and Derivatives Therapeutic Monitoring Pharmacy Note    Nicholas Rush is a 55 y.o. male starting valproic acid.    Indication: Mood stabilization    Prior Dosing Information: Current regimen 750 mg ER qhs    Goals:  Therapeutic Drug Levels  50 - 125 mcg/mL (mood stabilization).    Additional clinical monitoring/outcomes:   Pregnancy status (where indicated), CBC with differential (platelets/ANC), sodium level, hepatic function, amylase/lipase, ammonia level with encephalopathy    Results: Valproic Acid level 45.8 mcg/mL, drawn appropriately    Pharmacokinetic Information/Significant Interacting Medications:  ??? Interacting medications: none  ??? Lamotrigine: Valproate derivatives are known to increase lamotrigine concentrations. Recommended dose reduction and titration protocols are provided in the package insert  ??? Lorazepam: Valproate derivatives are known to increase serum exposure to lorazepam. Monitoring for lorazepam over-exposure and dose reductions may be required.     Assessment/Plan:  Recommendation(s)   Continue current dose of depakote 750mg  ER qhs    Follow-up  ??? Valproic acid level: order for 3/11.   ??? Steady-state trough serum valproic acid concentrations should be obtained within 3 - 5 days after initiation, dosing changes or addition of interacting medications.   ??? Male patients of child-bearing age: a daily supplemental folic acid dose of 1 - 4 mg (1000 - 4000 micrograms) is generally recommended for the prevention of neural tube defects (NTD) in male patients of child-bearing age receiving valproate    Formulation-specific Valproic Acid Derivative Monitoring Recommendations:  ??? Divalproex Delayed-release or Valproate Immediate-release (twice daily dosing): obtain serum concentrations 12 hours after the preceding dose  ??? Divalproex Extended-release (nightly dosing): obtain serum concentrations at least 18 hours after preceding dose  ??? Divalproex Extended-release (morning dosing): obtain serum concentrations as a 24-hr trough, immediately prior to next dose    Pharmacy will continue to follow patient with team an recommend levels as appropriate.    Lynnae January, PharmD

## 2020-03-24 NOTE — Unmapped (Signed)
Patient is A/O x 4  with no apparent distress at this time.  Bilateral PIVs were removed because they were both leaking at the insertion site. MD on call was notified. Falls precautions maintained, patient declines pain and is resting with orders not to be woken for VS, during the night. Will continue to monitor.   Problem: Fall Injury Risk  Goal: Absence of Fall and Fall-Related Injury  Outcome: Progressing  Intervention: Promote Injury-Free Environment  Recent Flowsheet Documentation  Taken 03/23/2020 2000 by Oletta Cohn, RN  Safety Interventions:  ??? fall reduction program maintained  ??? lighting adjusted for tasks/safety  ??? low bed     Problem: Asthma Comorbidity  Goal: Maintenance of Asthma Control  Outcome: Progressing     Problem: Behavioral Health Comorbidity  Goal: Maintenance of Behavioral Health Symptom Control  Outcome: Progressing     Problem: COPD (Chronic Obstructive Pulmonary Disease) Comorbidity  Goal: Maintenance of COPD Symptom Control  Outcome: Progressing     Problem: Diabetes Comorbidity  Goal: Blood Glucose Level Within Targeted Range  Outcome: Progressing     Problem: Heart Failure Comorbidity  Goal: Maintenance of Heart Failure Symptom Control  Outcome: Progressing     Problem: Hypertension Comorbidity  Goal: Blood Pressure in Desired Range  Outcome: Progressing     Problem: Obstructive Sleep Apnea Risk or Actual Comorbidity Management  Goal: Unobstructed Breathing During Sleep  Outcome: Progressing     Problem: Osteoarthritis Comorbidity  Goal: Maintenance of Osteoarthritis Symptom Control  Outcome: Progressing  Intervention: Maintain Osteoarthritis Symptom Control  Recent Flowsheet Documentation  Taken 03/23/2020 2000 by Oletta Cohn, RN  Activity Management: activity adjusted per tolerance     Problem: Pain Chronic (Persistent) (Comorbidity Management)  Goal: Acceptable Pain Control and Functional Ability  Outcome: Progressing     Problem: Seizure Disorder Comorbidity  Goal: Maintenance of Seizure Control  Outcome: Progressing     Problem: Skin Injury Risk Increased  Goal: Skin Health and Integrity  Outcome: Progressing     Problem: Adult Inpatient Plan of Care  Goal: Plan of Care Review  Outcome: Progressing  Goal: Patient-Specific Goal (Individualized)  Outcome: Progressing  Goal: Absence of Hospital-Acquired Illness or Injury  Outcome: Progressing  Intervention: Identify and Manage Fall Risk  Recent Flowsheet Documentation  Taken 03/23/2020 2000 by Oletta Cohn, RN  Safety Interventions:  ??? fall reduction program maintained  ??? lighting adjusted for tasks/safety  ??? low bed  Intervention: Prevent and Manage VTE (Venous Thromboembolism) Risk  Recent Flowsheet Documentation  Taken 03/23/2020 2000 by Oletta Cohn, RN  Activity Management: activity adjusted per tolerance  Intervention: Prevent Infection  Recent Flowsheet Documentation  Taken 03/23/2020 2000 by Oletta Cohn, RN  Infection Prevention:  ??? equipment surfaces disinfected  ??? hand hygiene promoted  ??? personal protective equipment utilized  ??? single patient room provided  Goal: Optimal Comfort and Wellbeing  Outcome: Progressing  Goal: Readiness for Transition of Care  Outcome: Progressing  Goal: Rounds/Family Conference  Outcome: Progressing     Problem: Self-Care Deficit  Goal: Improved Ability to Complete Activities of Daily Living  Outcome: Progressing

## 2020-03-25 ENCOUNTER — Ambulatory Visit: Admit: 2020-03-25 | Discharge: 2020-03-26 | Payer: MEDICAID

## 2020-03-25 MED ORDER — AMLODIPINE 5 MG TABLET
ORAL_TABLET | Freq: Every day | ORAL | 11 refills | 30.00000 days | Status: CP
Start: 2020-03-25 — End: 2021-03-25

## 2020-03-25 MED ORDER — GABAPENTIN 100 MG CAPSULE
ORAL_CAPSULE | 0 refills | 0 days
Start: 2020-03-25 — End: ?

## 2020-03-25 MED ORDER — AMLODIPINE 10 MG TABLET
ORAL_TABLET | Freq: Every day | ORAL | 3 refills | 90 days | Status: CP
Start: 2020-03-25 — End: 2021-03-25

## 2020-03-25 MED ORDER — OLANZAPINE 20 MG TABLET
ORAL_TABLET | Freq: Every evening | ORAL | 0 refills | 30 days | Status: CP
Start: 2020-03-25 — End: 2020-04-24

## 2020-03-25 NOTE — Unmapped (Addendum)
Decrease Amlodipine to 5mg  once daily  Increase gabapentin to 100mg  each morning and 200mg  at night.   Check your rollator with your PT  Handicap form  Schedule your colonoscopy [phone]   Labs on 04/01/20 - at [facility] - in the morning (depakote level and sodium)    See you on video on 3/14 at 2:30pm, I'll send you a link to your cell phone
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
## 2020-03-26 DIAGNOSIS — M545 Low back pain, unspecified: Secondary | ICD-10-CM | POA: Diagnosis present

## 2020-03-26 MED ORDER — OLANZAPINE 20 MG TABLET
ORAL_TABLET | Freq: Every evening | ORAL | 0 refills | 30 days | Status: CP
Start: 2020-03-26 — End: 2020-04-25

## 2020-03-26 NOTE — Unmapped (Signed)
Agreed to schedule colonoscopy  Reviewed what to expect

## 2020-03-26 NOTE — Unmapped (Signed)
Switched from lisinopril 20mg  to amlodipine 10mg  during the last hospital stay  Given his generalized weakness/deconditioning and low-normal BP of 106/73, will decrease dose to amlodipine 5mg  to prevent orthostasis at home  - has a bp monitor at home  - close follow-up by video visit

## 2020-03-26 NOTE — Unmapped (Signed)
On natlrexone 50mg  once daily  Describes ongoing cravings for ETOH  Reviewed negative consequences of alcohol use on his physical health
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
## 2020-03-26 NOTE — Unmapped (Signed)
Smoking 1ppd  Pre-contemplative for change

## 2020-03-26 NOTE — Unmapped (Signed)
Generalized deconditioned state, will start [facility] PT tomorrow

## 2020-03-26 NOTE — Unmapped (Signed)
Reports history of chronic LBP, treated by outside location   Previously was on gabapentin TID for pain control, now only on 200mg  at bedtime  Agreed ok to increase to 100mg  qam and continue 200mg  at bedtime  Will titrate slowly, as his mobility improves.  Reviewed risk of falls

## 2020-04-01 ENCOUNTER — Telehealth: Admit: 2020-04-01 | Discharge: 2020-04-02 | Payer: MEDICAID

## 2020-04-01 DIAGNOSIS — L409 Psoriasis, unspecified: Principal | ICD-10-CM

## 2020-04-01 DIAGNOSIS — M5440 Lumbago with sciatica, unspecified side: Principal | ICD-10-CM

## 2020-04-01 DIAGNOSIS — G8929 Other chronic pain: Principal | ICD-10-CM

## 2020-04-01 DIAGNOSIS — R195 Other fecal abnormalities: Principal | ICD-10-CM

## 2020-04-01 DIAGNOSIS — F17200 Nicotine dependence, unspecified, uncomplicated: Principal | ICD-10-CM

## 2020-04-01 DIAGNOSIS — I10 Essential (primary) hypertension: Principal | ICD-10-CM

## 2020-04-01 DIAGNOSIS — G9340 Encephalopathy, unspecified: Principal | ICD-10-CM

## 2020-04-01 DIAGNOSIS — F1020 Alcohol dependence, uncomplicated: Principal | ICD-10-CM

## 2020-04-01 DIAGNOSIS — E871 Hypo-osmolality and hyponatremia: Principal | ICD-10-CM

## 2020-04-01 MED ORDER — ADALIMUMAB SYRINGE CITRATE FREE 40 MG/0.4 ML
SUBCUTANEOUS | 1 refills | 0.00000 days | Status: CP
Start: 2020-04-01 — End: ?
  Filled 2020-05-17: qty 4, 28d supply, fill #0

## 2020-04-01 MED ORDER — HUMIRA SYRINGE CITRATE FREE 40 MG/0.4 ML
1 refills | 0 days | Status: CN
Start: 2020-04-01 — End: ?

## 2020-04-01 MED ORDER — NICOTINE 21 MG/24 HR DAILY TRANSDERMAL PATCH
MEDICATED_PATCH | Freq: Every day | TRANSDERMAL | 5 refills | 28 days | Status: CP
Start: 2020-04-01 — End: 2020-09-16
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
## 2020-04-01 NOTE — Unmapped (Signed)
HH services were declined because he is set up for out PT/OT  Reports his partner arranged [facility] for his appointments on 3/18  Using walker to get around the house.   Using a shower bench - bathing on 3-4 times per week.
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
## 2020-04-01 NOTE — Unmapped (Signed)
Reminded to schedule colonoscopy, h/o positive cologuard

## 2020-04-01 NOTE — Unmapped (Addendum)
Switched from lisinopril 20mg  to amlodipine 10mg  during the last hospitalization  Given his generalized weakness/deconditioning and low-normal last clinic BP of 106/73, I decreased amlodipine to 5mg .   -unable to find his home BP monitor to check, but will locate it and start logging home measures

## 2020-04-01 NOTE — Unmapped (Signed)
Admits he had 7 drinks (whiskey and ginger ale) over the weekend due to basketball game.   Adherent to Naltrexone 50mg 

## 2020-04-01 NOTE — Unmapped (Addendum)
Smoking 1ppd  Pre-contemplative for change: reasons to cut back include to improve health and to save money.  Feels that it is dirty habit. Quit for 9 months several years ago, using patch.  - agreed to start the patch again daily for goal of gradual reduction.  Tolerated while inpatient

## 2020-04-01 NOTE — Unmapped (Signed)
Reports history of chronic LBP, treated by outside location   Previously was on gabapentin TID for pain control, now only on 200mg  at bedtime  Agreed ok to increase to 100mg  qam and continue 200mg  at bedtime  Will titrate slowly, as his mobility improves.  Reviewed risk of falls

## 2020-04-01 NOTE — Unmapped (Signed)
Refill approved but patient is due for a TB test. Would ask that he schedule a f/up appt. Thank you

## 2020-04-01 NOTE — Unmapped (Signed)
Patient requesting a refill for Humira last seen 11/16/19

## 2020-04-02 NOTE — Unmapped (Signed)
Called patient to schd an appt had to LVM    Thanks  [facility]

## 2020-04-05 ENCOUNTER — Ambulatory Visit: Admit: 2020-04-05 | Discharge: 2020-05-04 | Payer: MEDICAID

## 2020-04-05 NOTE — Unmapped (Unsigned)
NEUROLOGICAL OUTPATIENT PHYSICAL THERAPY   Note Type: Evaluation         Patient Name: Nicholas Rush  Date of Birth:1965/03/13  Date: 04/05/2020  Session Number:  1  Therapy Diagnosis:   Encounter Diagnoses   Name Primary?   ??? Recurrent falls    ??? Impaired functional mobility, balance, gait, and endurance Yes   ??? Muscle weakness (generalized)    ??? Balance problems        Date of Injury/Onset: 03/15/2020  Date of Evaluation: 04/05/2020  Referring Pracitioner: Bryn Gulling   Certification Dates: Medicaid Auth submitted for 16 visits (OT has submitted for initial 3 visits)     Patient wore a mask for the entire therapy session., Patient's companion wore a mask for the entire therapy session., Therapist wore a mask for the entire session.  and Therapist wore eye goggles/frames during the entire session.     ASSESSMENT:    55 y.o. male presents with history of recurrent falls and multiple hospitilizations, resulting in endurance, strength, and balance deficits, limited activity tolerance, impaired posture, and back pain which are limiting the patient's independence, safety, and functional mobility.  During evaluation the patient demonstrates deficits in functional strength (5xSTS: 19.4 sec); endurance ( : 210 feet); mobility (gait speed: 0.3 m/s); and balance (TUG: 49 sec with RW). [name] has been seen for eval here at [facility] about a year ago with 1 follow session, however only returned for 1 follow up session. I reiterated to the patient that given the level of his deficits adherence to HEP as well as coming in for PT would be of particular importance. He endorsed understanding of this. [name] has a rollator that he uses at home for very short household distance walking, but has not been physically active recently due to being in and out of the hospital. Patient will benefit from skilled Physical Therapy services to address the above noted deficits/impairments as the patient requires: assistance to optimize safety, facilitating independence, verbal cueing, analyzing/modifying performance, manual/physical assistance, inhibition of abnormal/compensatory strategies, establishing a HEP, facilitating function, providing instructions/education, developing compensatory strategies, gait training and environmental modifications.     Next session: NuStep, ambulation, LE strength, postural strengthening      Outcome measures:  Test Eval (04/05/2020)    TUG 49 seconds with RW and CGA.    210 ft with rolling walker, CGA, 2 seated rest breaks    5xSTS 19.4 seconds with heavy UE use, legs hinging, rolling walker in front.    Gait speed ( ) 0.3 m/s with rolling walker         Problem List: decreased BLE strength, impaired balance, decreased ROM, impaired posture, deconditioning, pain, impaired flexibility, impaired ambulation, impaired transfers, impaired functional mobility, impaired ADLs, impaired self care and core weakness    Prognosis:  Fair   Positive Indicators: good caregiver/family support   Negative Indicators: symptom severity, endurance deficits, functional strength deficits, multiple co-morbidities, poor motivation, history of non-compliance and overall health status    Personal Factors/Comorbidities Present: 1-2 personal factors and/or comorbidities that will impact the plan of care for the current problem, including HTN, hx of ETOH abuse, hx of non-compliance, complicated psychiatric hx    Examination of Body Systems includes 3  body structures, functions, activity limitations and/or participation restrictions including musculoskeletal, LE and functional mobility    Clinical Decision Making:  Moderate Complexity Eval Code:  Data from the patient???s history indicates 1-2 personal factors or comorbidities that will affect the treatment of  the current problem. Examination of body systems reveals 3 or more body structures, functions, activity limitations and/or participation restrictions and the clinical presentation is evolving. The results of a standardized functional test or outcome measure indicates the clinical decision making was of moderate complexity.      Goals    Patient/Family Goals: improve walking and mobility     Long Term Goals 8 weeks  1. Patient will be independent with upgraded HEP and transition to community based wellness program as appropriate.  2. Patient will ambulate > 400 feet with LRAD with symmetrical step length and limited negative compensatory strategies.  3. Patient will improve score on TUG to <30 seconds with LRAD to demonstrate improved mobility, balance and reduced risk of falls.  4. Patient will improve score on 5xSTS to <15 seconds with no UE support to demonstrate improved LE strength, postural control, and reduced risk of falls.  5. Patient will improve gait speed to >0.6 m/s with LRAD to demonstrate improved mobility and reduced risk of falls.      PLAN/Recommendations: 2 x a week for 8 weeks  Planned Interventions: Manual Therapy  Gait Training  Therapeutic Activites  Neuromuscular Education  Wheelchair Management & Risk analyst Test  Therapeutic exercise  Balance training  Postural exercises/education  Body mechanics/education  E-stim  Taping  Biofeedback  Education  Dry needling as appropriate    PT DME/Equipment Recs: Assistive Device for ambulation    SUBJECTIVE:  Pt states: I'm real unstable. hard to stand still  Was in and out of the hospital last few months. From being in the bed so much I have no leg strength.     Uses rolling walker at house, feels like hamstrings and glutes are especially week.    Has TM, but can't use right now.      Reason for Referral/History of Present Condition/Onset of injury/exacerbation:   55 y.o. male presents to the [facility] with  a past medical history significant for  Bipolar Disorder type 1, alcohol use disorder with hx of complicated withdrawal, HTN, psoriasis on adalimumab, and chronic hyponatermia who presents with altered mental status in the setting of hyponatremia, now improving. .     Last Fall: a few months ago  Precautions: fall risk  Red Flags:  none    Prior Functional Status: mod I with rollator at home     Current Functional Status: more confident in transport chair, room to room short household distances with rollator  Currently used Equipment: rollator, rolling walker, transport chair, shower bench  Previous Treatment: brief acute PT, brief OPPT at [facility] (2 visits) last year  Employment/Recreation: unemployed     Social History: Lives with wife, 2 kids in [place] with ramp.    Caregiver availability, capability, willingness: wife  Independent w/ ADLs?: mostly, help with shower    Patient???s communication preference: Verbal, Written and Visual    Barriers to Learning: none    Recent Procedures/Tests/Findings    See EMR for full history of procedures/tests/findings.    Past Medical History:   Diagnosis Date   ??? Anxiety    ??? Bipolar 1 disorder (CMS-HCC)    ??? Depressive disorder    ??? Erectile dysfunction    ??? Esophageal reflux    ??? History of pyloric stenosis    ??? Hypertension    ??? Insomnia    ??? Psoriasis     Dermatologist - Dr. Deloria Lair in [facility]   ??? Seizure (  CMS-HCC) 02/20/2019   ??? Tobacco use     1ppd     Family History   Problem Relation Age of Onset   ??? Bipolar disorder Mother    ??? Depression Sister    ??? Alcohol abuse Sister    ??? Alcohol abuse Maternal Aunt    ??? Melanoma Neg Hx    ??? Basal cell carcinoma Neg Hx    ??? Squamous cell carcinoma Neg Hx        Past Surgical History:   Procedure Laterality Date   ??? PYLOROMYOTOMY     ??? WISDOM TOOTH EXTRACTION        Allergies   Allergen Reactions   ??? Tramadol      Risk of seizures   ??? Hydroxyzine Itching     Sedation.  Ineffective for reducing anxiety.        Social History     Tobacco Use   ??? Smoking status: Current Every Day Smoker     Packs/day: 1.00     Years: 30.00     Pack years: 30.00     Types: Cigarettes   ??? Smokeless tobacco: Never Used Substance Use Topics   ??? Alcohol use: Yes     Comment: 3 drinks a day.      Current Outpatient Medications   Medication Sig Dispense Refill   ??? acetaminophen (TYLENOL) 500 MG tablet Take 500 mg by mouth every six (6) hours as needed for pain.     ??? ADALIMUMAB SYRINGE CITRATE FREE 40 MG/0.4 ML Inject the contents of 1 syringe (40 mg) under the skin every 14 days 4 each 1   ??? amLODIPine (NORVASC) 5 MG tablet Take 1 tablet (5 mg total) by mouth daily. 30 tablet 11   ??? calcium carbonate (TUMS) 200 mg calcium (500 mg) chewable tablet Chew 2 tablets daily as needed for heartburn.     ??? calcium carbonate/vitamin D3 (CALCIUM 500 + D ORAL) Take 1 tablet by mouth daily.     ??? cetirizine (ZYRTEC) 10 MG tablet Take 10 mg by mouth daily.     ??? divalproex ER (DEPAKOTE ER) 250 MG extended release 24 hr tablet Take 3 tablets (750 mg total) by mouth nightly. 90 tablet 0   ??? fluticasone propionate (FLONASE) 50 mcg/actuation nasal spray 2 sprays into each nostril daily. 16 g 12   ??? folic acid (FOLVITE) 1 MG tablet Take 1 tablet (1 mg total) by mouth daily. 30 tablet 11   ??? gabapentin (NEURONTIN) 100 MG capsule 100mg  each morning and 200mg  each night 180 capsule 0   ??? magnesium oxide (MAG-OX) 400 mg (241.3 mg elemental magnesium) tablet Take 2 tablets (800 mg total) by mouth daily. 120 tablet 0   ??? melatonin 3 mg Tab Take 1 tablet (3 mg total) by mouth every evening. 30 tablet 0   ??? multivitamin,tx-iron-minerals (THERA-M) Tab Take 1 tablet by mouth daily. 30 tablet 0   ??? naltrexone (DEPADE) 50 mg tablet Take 1 tablet (50 mg total) by mouth daily. 30 tablet 0   ??? nicotine (NICODERM CQ) 21 mg/24 hr patch Place 1 patch on the skin daily. 28 patch 5   ??? nicotine polacrilex (NICORETTE) 4 MG gum Apply 1 each (4 mg total) to cheek every hour as needed for smoking cessation. 110 each 0   ??? OLANZapine (ZYPREXA) 20 MG tablet Take 1 tablet (20 mg total) by mouth nightly. 30 tablet 0   ??? omeprazole (PRILOSEC) 20 MG capsule TAKE 1  CAPSULE(20 MG) BY MOUTH EVERY DAY 90 capsule 0   ??? sennosides (SENNA LAX ORAL) Take 1 tablet by mouth daily as needed (constipation).     ??? sodium chloride 1 gram tablet Take 1 tablet (1 g total) by mouth Three (3) times a day with a meal. 100 tablet 2   ??? thiamine (B-1) 100 MG tablet Take 1 tablet (100 mg total) by mouth daily. 110 tablet 0     No current facility-administered medications for this visit.            OBJECTIVE :  Posture:    Sitting: rounded shoulders and foward head   Standing: foward flexed posture, wide BOS, rounded shoulders and foward heard  Skin assessment/Edema: n/a    Pain:  Current:  0/10   Max:  10/10 back pain   Least:  0/10  Pain Frequency: intermittent  Pain Aggravated By: movement   Pain Relieved By: tylenol and advil, rest    Sensation:  Gross sensation screen: Intact     Proprioception:  NT    Vision:  Wears reading glasses    Vestibular:    Denies dizziness    Range of Motion:   WFL    Strength/MMT:  LE MMT   LE MMT Left Right   Hip flex:  (L2) 3+/5 3+/5   Hip ext: 3/5 3/5   Hip IR 4/5 4/5   Hip ER 4/5 4/5   Knee ext:  (L3) 4/5 4/5   Knee flex: (S2) 3+/5 3+/5   Ankle DF:  (L4) 4+/5 4+/5       Motor Function/Coordination:   Impaired finger to nose coordination  Impaired heel to shin coordination  Impaired Rapid alternating movement     Reflexes/Tone/Spasticity:   NT    Transfers:   Supine<>Sit: mod I  Sit<>Stand:mod I     Balance:    Unable to stand without UE support     See TUG score    Gait Analysis:  Patient ambulates with rolling walker  and CGA, and the following gait deviations:  limited toe clearance, forward flexed posture  decreased gait speed  wide BOS  decreased terminal hip ext on B    Stairs: deferred     Functional Tests/Outcome Measures:    TUG: 49 seconds with RW and CGA.  5xSTS: 19.4 seconds with heavy UE use, legs hinging, rolling walker in front.  Gait speed ( ): 0.3 m/s      HEP:  Bridges  Sit<>stand  Shoulder blade squeeze  SLR      Patient Education: Throughout evaluation patient educated regarding the following: Role of PT in Rehabilitation, HEP, importance of therapy, equipment recommendations, posture, body mechanics, treatment plan and Indications/Contraindications to Exercises. Patient demonstrated and verbalized agreement and understanding.        I reviewed the no-show/attendance policy with the patient and caregiver(s). The patient and/or family is aware that they must call to cancel appointments more than 24 hours in advance. They are also aware that if they late cancel (less than 24 hours from appointment), arrives greater than 15 minutes late, or no-show three times, we reserve the right to cancel their remaining appointments. This policy is in place to allow [place] to best serve the needs of our caseload.       Communication/consultation with other professionals:  discussed POC with OT  submitted medicaid auth    Total Time: 45 min   PT Evaluation: 35 min   Treatment Rendered:    Self  Care: 10 mins    I attest that I have reviewed the above information.  Signed: Christoper Allegra, PT, DPT  04/05/2020 2:16 PM

## 2020-04-05 NOTE — Unmapped (Signed)
OUTPATIENT OCCUPATIONAL THERAPY   Note Type: Treatment Note       Patient Name: Nicholas Rush  Date of Birth:01-21-1965  Date of Visit: 04/05/2020  Visit #:  1/10 towards re-assessment, 1 total.  Date of Injury/Onset: 2022  Date of Evaluation: 04/05/2020  Referring Physician: Bryn Gulling  Reason for Referral: Eval and Treat  Plan of Care: Medicaid Submitted  Encounter Diagnoses   Name Primary?   ??? Recurrent falls    ??? Fine motor impairment Yes   ??? Decreased strength        ASSESSMENT  55 y.o. year old right handed male with history of history of frequent hospitalizations over the past months due to mania from likely bipolar 1 disorder, TBI, and alcohol use disorder, who presents with impaired balance, fall risk, impaired bilateral FMC, decreased strength in BUE, general deconditioning, and limited motivation impairing participation in ADLs and IADLs. At this time patient reports IND with majority of ADL tasks but receives assistance from wife to shave and has not returned to showering in bathroom due to fear of falls. Patient reports despite IND with majority of grooming tasks he inconsistently completes them due to limited motivation. Patient's demonstrated reduced bilateral grip strength this date as compared to previous OT evaluation at this site last year, 03/08/2019, with significant 11lb decrease of left hand. At this time patient would benefit from participation in outpatient occupational therapy for provision of HEP to target grip strength and [facility] skills, development of daily hygiene routine, strategies to safely return to showering, and improved [facility] skills to IND tie shoelaces. Patient requires skilled outpatient Occupational Therapy services to address the above deficits and maximize independence with ADLs and IADLs in home environment.    Patient wore a mask for the entire therapy session., Patient's companion wore a mask for the entire therapy session., Therapist wore a mask for the entire session.  and Therapist wore eye goggles/frames during the entire session.       Complexity:  Moderate Complexity Eval Code: Clinical decision making was of moderate complexity, as data from the client???s history, profile, detailed assessments, and consideration of several treatment options.  Patient presents with comorbidities that affect occupational performance.  Minimal to moderate modification of tasks is necessary.      Long Term Goals:   1. In 9 sessions, patient will perform upgraded home exercise program independently  to max IND with ADLs and IADLs.  2. In 9 sessions, patient will complete showering task with supervision using AE as needed (TTB, long handled bath sponge) for improved IND and safety with task.  3. In 9 sessions, patient will independently tie shoelaces to max IND with LBD task  4. In 9 sessions, patient will report implementation and completion of daily hygiene routine 7/7 days a week to max IND with grooming tasks.   5. In 9 sessions, patient will demonstrate improved grip strength of 5 or more lbs for improved functional use of BUE in ADLs and IADLs.  6. In 9 sessions, Pt will demonstrate improved right fine motor coordination as evidenced by completing 9HPT in 35 or fewer sec for improved participation in ADLs and IADLs.  7. In 9 sessions, Pt will demonstrate improved left fine motor coordination as evidenced by placing 5 or more pegs in 50 seconds or less in 9HPT for improved participation in ADLs and IADLs.        Patient in agreement with plan of care?: yes    Prognosis for  goal achievement: Fair due to supportive family, motivation, history of non-compliance and behavior.    PLAN  Pt. will participate in:  Therapeutic Exercise  Therapeutic Acitivity  Manual Therapy  Neuromuscular Re-education  Orthotic management  Self-care home training  Energy conservation  Hot/Cold pack  Cognitive skills  Adaptive device  Assistive technology    Next session: HEP    Planned frequency and duration of treatment:  2x a week for 4 weeks.  Plan will be adjusted as needed.     Past Medical History:   Past Medical History:   Diagnosis Date   ??? Anxiety    ??? Bipolar 1 disorder (CMS-HCC)    ??? Depressive disorder    ??? Erectile dysfunction    ??? Esophageal reflux    ??? History of pyloric stenosis    ??? Hypertension    ??? Insomnia    ??? Psoriasis     Dermatologist - Dr. Deloria Lair in [facility]   ??? Seizure (CMS-HCC) 02/20/2019   ??? Tobacco use     1ppd       Past Surgical History:   Past Surgical History:   Procedure Laterality Date   ??? PYLOROMYOTOMY     ??? WISDOM TOOTH EXTRACTION         SUBJECTIVE:  I've been bedridden and chair ridden so I haven't been getting around and so I'm less confident about doing stuff by myself.     History of Present Condition: Per referring physician's note:  -- Will need outpatient colonoscopy, referral to GI placed at discharge  -- Will need to obtain lithium level 2/28 - AM draw before he takes dose. Relayed to outpatient psych provider who will order this during the pt's appointment on 2/28.  -- PT/OT referrals placed at discharge  -- Will need repeat low-dose screening CT chest in 1 year (Feb 2023)  -- Recommend monitoring weekly sodium at least until stable lithium dose is reached and sodium remains stable  -- May consider alternative antihypertensive in the future while on lithium therapy  -- Patient complaining of anxiety inpatient which may have been situational. Would reassess outpatient and could titrate gabapentin to help with both chronic pain and anxiety  ??  Reason for Admission: Nicholas Rush is a 55 y.o. male with a history of mania from likely bipolar 1 disorder, TBI, alcohol use disorder, and chronic hyponatremia who was admitted voluntarily to the [facility] St[facility]ization unit for safety, stabilization, and management of medications. Please refer to full History and Physical note for details.  ??  Monitoring: The level of observation on unit was initially During this time of the global COVID-19 pandemic and in following the overall hospital protocol, the use of a procedural mask by patients as a medical device is indicated to limit the spread of the disease. Will initiate q15 min safety check for unpredictable behavior based on my risk assessment. I have reviewed the chart, interviewed the patient, and taken into consideration the suicide risk factors.  q15 min checks and off unit, Restrict to unit. The patient did not have any episodes of  aggression, agitation, self-injurious behavior, inappropriate behavior, and attempted elopement while admitted. The patient was able to maintain safe behavior on the unit before discharge, but in the setting of COVID19 pandemic and mask requirements for patients, safety checks are maintained at every 15 minutes.  ??  Psychiatry: The patient was offered the following resources during their hospitalization: psychiatric physician and nursing services, clinical case management, occupational  and recreational therapies, and physical therapy. The aforementioned disciplines established multidisciplinary treatment plan(s) within 24 hours of admission, which was discussed on a daily basis and updated weekly. The patient had access to individual, group, and milieu therapeutic modalities.??                          Diagnostic clarification was achieved through serial mental status examinations, behavioral observations, collateral obtained from family, outpatient providers, previous medical records. Rating scale results were reviewed, compared to clinical observations, and were used to inform treatment decisions. The patient was transferred from a medical hospitalization for acute on chronic hyponatremia after he had been stabilized and evaluated by Nephrology and Psychiatry. 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 His sodium was stabilized to apparent baseline of 130 with 1.5L fluid restriction and salt tablets. After discussion with patient and family, they preferred discontinuation of Depakote not only because of risk of SIADH but also because patient feels it may have caused dysgeusia. It is important to note that his hyponatremia, per chart review, predates initiation of Depakote so it could still serve as a suitable mood stabilizer in the future if needed. While patient had not exhibited any signs or symptoms of mania during medical hospitalization, his multiple recent hospitalizations for manic behavior warranted inpatient medication adjustment for close monitoring. It is still felt that his history of symptoms including decreased need for sleep, increased goal directed activity, elevated mood, pressured speech, and disorganization are consistent with Bipolar 1, especially given strong family history. Complicated by late-in-life diagnosis, but it is possible that TBI last year may have contributed to increased impulsivity, and patient is intermittently adherent with medications. There was a discussion of side effects, risks, benefits, alternatives, and indications for treatment with lithium, including but not limited to polydipsia, thyroid dysfunction, renal dysfunction, tremor, GI upset, and sodium abnormalities. The patient and wife asked appropriate questions, acknowledged understanding of answers, and provided informed consent to initiation of lithium to target mood stabilization. This medication was chosen because of a family member's positive treatment response to this class of medication and prior medication trials with alternate medications, including Depakote . Lamictal was considered but the long titration schedule made close inpatient monitoring during a medication change impractical and not in line with patient's goal for hospitalization. The patient was maintained on Zyprexa and the dose was increased to 20mg  nightly. It was hoped that patient could remain on monotherapy of Zyprexa for maintenance treatment as he did not exhibit any symptoms of mania with taper of Depakote. However patient and wife strongly preferred he be treated with a mood stabilizer. With regard to medication tolerability, the patient tolerated them well and did not report any bothersome side effects.               During the patient???s hospital course, he maintained mood stability.  The patient???s participation in therapeutic groups, 1:1 interactions with staff, and family interactions were appropriate. The team maintained regular contact with family and outpatient providers throughout the admission for ongoing inpatient management and disposition planning.               The patient will return to home at the time of discharge. The patient will follow up with outpatient services, including [facility] for psychiatric and medical management. The treatment team provided psycho-education and made recommendations regarding cessation of substance use, medication adherence, and healthy lifestyle modifications .  Recommendation for discharge safety planning included: removal of all firearms from the home, locking all sharps in a secure container, locking all weapons and dangerous objects in a secure container, and locking all medications in a secure container.  ??  Other Medical Issues: Admissions labs were reviewed and found to be remarkable for Na 128 . Further admission lab work was not performed as he had just been discharged from medical hospitalization. An EKG was not performed but EKG 01/03/20 was NSR with Qtc . Additional laboratory monitoring included: daily sodium which remained stable at 130-131 during admission. The patient was monitored for physical complaints, including potential medication side effects. The following medical problems were addressed during the patient's admission: Regarding his hyponatremia, part of the purpose of this admission was to see if Depakote was causing SIADH, judging by whether sodium would improve as it was tapered. Rather sodium remained stable at ~130 throughout this time while he was maintained on 1.5L fluid restriction and TID salt tabs. He seemed to remain at what appears to be his cognitive baseline throughout his admission. He reports low back pain that seems to be chronic but that is still significantly limiting his mobility. He used a rollator to get around most of the time. Consideration was given to tapering gabapentin as this can have cognitive side effects and affect balance, but patient disagreed as he says it helps his low back pain. He was seen by PT and OT due to deconditioning, and referrals were placed for him to continue with them outpatient. There was also consideration of the possibility of underlying malignancy causing SIADH. Screening lung CT was done given his smoking history and showed subcentimeter nodules that will require repeat imaging in 1 year. He also has a history of a positive Cologuard in 2018 but never had a colonoscopy, and here he had low back pain and some intermittent constipation raising concern for colorectal malignancy. CT A/P was performed to evaluate for malignancy since inpatient colonoscopy was unlikely to be performed, and it did not show any evidence of metastatic disease or malignancy in the abdomen or pelvis. An ambulatory referral to GI was made for outpatient colonoscopy. His blood pressures were noted to be in the 90s/60s on occasion, so lisinopril was decreased from 40mg  to 20mg  daily. May consider alternative agent in the future while he is on lithium therapy. Patient's wife noted a mild tremor which is postural. Patient is unsure if this is entirely new because he has had it before, but does not find it bothersome at this time. Metabolic monitoring was performed by checking hemoglobin A1c on 03/13/20 (5.5%) and a lipid panel during his prior admission on 02/22/20 (HDL 101, LDL 44)    Precautions: Fall Risk    Prior Therapies: Inpatient/Outpatient OT and PT    Patient???s Goals: To be able to shower.    Pain  Pain present?: no pain reported     Social History:   Lives with: Wife (Christine), Alex, and pt's wife daughter   Stories: 1SH with handicap ramp  Bathroom Layout: Tub shower   Home Equipment: TTB, transport w/c, non-skid mat, rolling walker, raised toilet with arm rests, urinal     PLOF: Recently diagnosed with Bipolar Disorder in past year    OBJECTIVE  Current Level of Function  Feeding: independent  Grooming: IND completing majority of hygiene tasks with exception of shaving but reports inconsistent completion of tasks daily   Toileting: independent  Bathing: IND with sponge bathing, has not showered  since returning home and reports concerns of safety doing so  Dressing ZO:[id number]  Dressing LE: independent currently wearing slide on shoes as he is unable to tie laces  Transfers (bed, chair, toilet, shower): independent  Bed Mobility: independent    Instrumental ADLs:  Medication management: P'ts wife sets up pill box organizer, using routine to take meds   Health management: Receives A from wife and family members  Caregiver Availability/Willingness/Capability: Pt's wife   Money management: Pt's wife   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     Sleep: Not good reports challenge falling asleep initially and after he gets up to go to the bathroom    General Status:  Comments: generally oriented     Balance:  Comments: patient balanced while seated in w/c    Strength Testing    5/5  Full Anti-Gravity plus Full Resistance-maintains  4/5  Full Anti-Gravity plus Partial Resistance- Gives  3/5  Full Anti-Gravity, cannot take Resistance  2/5  Full Gravity Eliminated  1/5  Partial Gravity Eliminated  T    Trace  0/5  No Movement     Left Right ROM   Shoulder flex 4/5 4/5 WNL   Shoulder abd 4/5 4/5 WNL   Biceps  4/5 4/5 WNL   Triceps 4/5 4/5 WNL   External rot 4/5 4/5 WNL   Internal rot 4/5 4/5 WNL   Wrist ext 4/5 4/5 WNL   Wrist flex 4/5 4/5 WNL   Supination 4/5 4/5 WNL   Pronation 4/5 4/5 WNL         Grip Strength:  Grip Strength: A quantitative and objective measure of isometric muscular strength of the hand and forearm.  This instrument is scored using force production in pounds    Standardized procedure for positioning consists of the subject seated with back, pelvis, and knees as close to 90 degrees as possible, shoulder is adducted and neutrally rotated, elbow flexed at 90 degrees, forearm neutral, wrist held between 0-15 degrees of ulnar deviation. The arm is not supported by examiner or armrest and the dynamometer is presented vertically and in line with the forearm. Maximum grip is the mean of three trials.        L grip (pounds): 30, 36, 35 = 33.67lb average  R grip (pounds): 37, 32, 38 = 35.67lb average   Male -  age 48-59 R: 79-86 L: 31-76      Coordination  Nine Hole Peg Test RUE: 59s LUE 2 in 1 minute  Age 19-59 R 14-24 L 15-27    Spasticity:  Modified Ashworth Scale of Muscle Spasticity - Key: Modified Ashworth (supine)   0: No increased tone   1: Slightly increased tone, manifested by a catch and release at end ROM   1+: Slightly increased tone, a catch followed by minimal resistance through remainder (1/2) of the ROM   2: More marked increase in tone, affected part easily moved   3: Considerable increase in tone, passive movement  difficult   4: Affected part rigid in flexion or extension     Edema:   Swelling in dorsum of L hand, distal to wrist     Sensation:  Comments: L hand tingling      Vision and Visual Perception:   Eye Appearance: symmetrical  Subjective Vision Complaints: none reported  Date of last vision exam: ~1.5 years ago  Vision Dx: wears readers   Glasses/How Long/Eye Trauma/Surgery: N/A  Saccades: intact  Pursuits: intact deviation, fixation, jumping  Convergence x3 Endurance: impaired    Treatment Rendered  Evaluation Only    Total Evaluation time:  45 minutes    Total Treatment Time:  0 minutes    Education:  Topics:Disease Process   Education Provided to: patient and family  Education Type:Explanation  Response to education/teachback:Verbal understanding recieved    I reviewed the no-show/attendance policy with the patient and caregiver(s). The family is aware that they must call to cancel appointments more than 24 hours in advance. They are also aware that if they late cancel or no-show three times, we reserve the right to cancel their remaining appointments. This policy is in place to allow [place] to best serve the needs of our caseload.       I attest that I have reviewed the above information.  Signed: Bernadette Hoit, OT 04/05/2020 1:56 PM

## 2020-04-10 NOTE — Unmapped (Signed)
Patient was called to be reminded of 3/24 appointment with Dr. Claudean Kinds at 3:00 PM.

## 2020-04-11 ENCOUNTER — Ambulatory Visit
Admit: 2020-04-11 | Discharge: 2020-04-12 | Payer: MEDICAID | Attending: Student in an Organized Health Care Education/Training Program | Primary: Student in an Organized Health Care Education/Training Program

## 2020-04-11 MED ORDER — SODIUM CHLORIDE 1 GRAM TABLET
ORAL_TABLET | Freq: Three times a day (TID) | ORAL | 2 refills | 34.00000 days | Status: CP
Start: 2020-04-11 — End: 2021-04-11

## 2020-04-11 MED ORDER — OLANZAPINE 20 MG TABLET
ORAL_TABLET | Freq: Every evening | ORAL | 0 refills | 30 days | Status: CP
Start: 2020-04-11 — End: 2020-05-11
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
## 2020-04-11 NOTE — Unmapped (Signed)
[facility]  [address] [place], [place] [address]    [phone]    [name] called to cancel his scheduled Occupational Therapy follow-up session less than 24 hours prior to scheduled appointment. Please contact me if you have any questions or concerns.     Thank you for this referral,     Signed: Bernadette Hoit, OT  04/11/2020 10:10 AM

## 2020-04-11 NOTE — Unmapped (Addendum)
Follow-up instructions:  -- If your sodium is low, Dr. Claudean Kinds will contact you to restart sodium tablets. I want you to start taking sodium 1 g three times daily like you were in the hospital!   -- Please continue taking your medications as prescribed for your mental health.   -- Do not make changes to your medications, including taking more or less than prescribed, unless under the supervision of your physician. Be aware that some medications may make you feel worse if abruptly stopped  -- Please refrain from using illicit substances, as these can affect your mood and could cause anxiety or other concerning symptoms.   -- Seek further medical care for any increase in symptoms or new symptoms such as thoughts of wanting to hurt yourself or hurt others.     Contact info:  Life-threatening emergencies: call 911 or go to the nearest ER for medical or psychiatric attention.     Issues that need urgent attention but are not life threatening: call the clinic outpatient frontdesk at [phone] for assistance.     Non-urgent routine concerns, questions, and refill requests: please use [facility] My [facility] to send me a message or leave me a voicemail at [phone] and I will get back to you within 2 business days.     Regarding appointments:  - If you need to cancel your appointment, we ask that you call [phone] at least 24 hours before your scheduled appointment.  - If for any reason you arrive 15 minutes later than your scheduled appointment time, you may not be seen and your visit may be rescheduled.  - Please remember that we will not automatically reschedule missed appointments.  - If you miss two (2) appointments without letting us know in advance, you will likely be referred to a provider in your community.  - We will do our best to be on time. Sometimes an emergency will arise that might cause your clinician to be late. We will try to inform you of this when you check in for your appointment. If you wait more than 15 minutes past your appointment time without such notice, please speak with the front desk staff.    In the event of bad weather, the clinic staff will attempt to contact you, should your appointment need to be rescheduled. Additionally, you can call the Patient Weather Line [phone] for system-wide clinic status    For more information and reminders regarding clinic policies (these were provided when you were admitted to the clinic), please ask the front desk.

## 2020-04-12 LAB — VALPROIC ACID LEVEL, TOTAL: VALPROIC ACID TOTAL: 50.1 ug/mL (ref 50.0–100.0)

## 2020-04-12 LAB — BASIC METABOLIC PANEL
ANION GAP: 16 mmol/L — ABNORMAL HIGH (ref 5–14)
BLOOD UREA NITROGEN: 7 mg/dL — ABNORMAL LOW (ref 9–23)
BUN / CREAT RATIO: 9
CALCIUM: 9.7 mg/dL (ref 8.7–10.4)
CHLORIDE: 90 mmol/L — ABNORMAL LOW (ref 98–107)
CO2: 22 mmol/L (ref 20.0–31.0)
CREATININE: 0.78 mg/dL
EGFR CKD-EPI AA MALE: >90 mL/min/{1.73_m2} (ref >=60)
EGFR CKD-EPI NON-AA MALE: >90 mL/min/{1.73_m2} (ref >=60)
GLUCOSE RANDOM: 91 mg/dL (ref 70–179)
POTASSIUM: 3.9 mmol/L (ref 3.4–4.8)
SODIUM: 128 mmol/L — ABNORMAL LOW (ref 135–145)

## 2020-04-12 LAB — LITHIUM LEVEL: LITHIUM LEVEL: <0.1 mmol/L — ABNORMAL LOW (ref 0.5–1.2)

## 2020-04-12 LAB — MAGNESIUM: MAGNESIUM: 1.2 mg/dL — ABNORMAL LOW (ref 1.6–2.6)

## 2020-04-12 NOTE — Unmapped (Signed)
Patient's sodium resulted as 128. Patient has had this low of sodium levels before, however would benefit from sodium supplementation as discussed at yesterday's appointment. Discussed with primary provider Dr. Donia Ast and Dr. Ulla Potash, Dr. Roxanne Gates will contact pt and/or significant other to relay sodium level results and need to restart sodium tablets. Patient had instructions from me during appointment importance of immediate presentation to ED for any emergence of symptoms concerning for symptomatic hyponatremia.

## 2020-04-12 NOTE — Unmapped (Signed)
Called patient to inform of his lab results --> it went to voicemail.  I left a message informing him his sodium is low again, and that he should restart the Sodium Chloride 1gram tablets 3 times per day.     Please try calling Nicholas Rush on Monday 3/28 to ensure he got my message  Please remind him to keep his appointment with the kidney doctors on 4/4

## 2020-04-15 MED ORDER — DIVALPROEX ER 250 MG TABLET,EXTENDED RELEASE 24 HR
ORAL_TABLET | 1 refills | 0 days | Status: CP
Start: 2020-04-15 — End: ?

## 2020-04-15 NOTE — Unmapped (Signed)
Spoke with patient's wife 3/28 1:38-1:42pm [name] who reports that patient is doing very well. She feels his mentation/mood are at baseline. Informed her that sodium had dropped to 128 and she reassured that patient has restarted taking his sodium tablets this past Friday. Per wife, patient is not showing any signs of psychosis, mania, or hyponatremia. Encouraged wife to reach out with any concerns between now and next follow up on 04/29/20.     Donia Ast, MD  PGY2 Psychiatry

## 2020-04-16 NOTE — Unmapped (Signed)
The [facility] has made a third and final attempt to reach this patient to refill the following medication: Humira.      We have left voicemails on the following phone numbers: [phone] (3/16, 3/21, 3/29), [phone] (3/21, 3/29), have been unable to leave messages on the following phone numbers: [phone] (no VM set up, 3/21 and 3/29) and have sent a MyChart message.    Dates contacted: 3/16, 3/21, 3/29  Last scheduled delivery: 2/21 for 1 month supply    The patient may be at risk of non-compliance with this medication. The patient should call the [facility] at [phone] (option 4) to refill medication.    Lanney Gins   [facility] Specialty Pharmacist

## 2020-04-16 NOTE — Unmapped (Signed)
Called patient at Dr.Yu's request to report recent lab results. No answer. Left voicemail to return call to the clinic.

## 2020-04-22 ENCOUNTER — Telehealth
Admit: 2020-04-22 | Discharge: 2020-04-23 | Payer: MEDICAID | Attending: Student in an Organized Health Care Education/Training Program | Primary: Student in an Organized Health Care Education/Training Program

## 2020-04-22 NOTE — Unmapped (Addendum)
Referring Provider: Zandra Abts, MD     PCP: Fabio Pierce, MD    04/22/2020    Chief Complaint: Hyponatremia    HPI:  [name] is a 55 y.o. male with chronic hyponatremia (baseline in low 130s), bipolar 1 disorder, alcohol abuse, HTN, psoriasis who is was seen in consultation at the request of Zandra Abts, MD for evaluation of hyponatremia.     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     Patient then presented to [facility] on 02/22/20 with altered mental status secondary likely hyponatremia. His sodium was 111. Na corrected in first 24 hours from 111 to 119 which corrected very quickly in first few hours with 1 liter of NS in ED (urine osm 133 and urine 20 on 2/4) and required a couple hours of D5W for overcorrection. Na in next 24 hours from 119 to 126 with NS at 75 ml/hr (urine osm 314 and urine sodium 71). He also had some seizure like activity that may have been from hyponatremia vs alcohol withdrawal. His volume status was documented as being volume down. Since 2/5-2/15 his sodium remained in 126-130 range and patient had been fluid restricted to 1.5 Liters (urine osm 649 and urine sodium 110) and his current volume status was euvolemic. He takes Olanzapine and Depakote for his bipolar disorder. Nephrology consulted virtually on 2/15 for assistance with hyponatremia. CT scan of lung showed solitary pulmonary nodule. CTAP showed no signs of cancer. On discharge 03/24/20 patient sodium was 134. He stopped taking the salt tablets at 1 g TID, and sodium checked on 04/11/20 was 128, and since then patient has restarted taking the salt tablets.    Patient denies excessive water intake, and only drinks to thirst. He does still drinks about 2-3 shots of hard liquor daily. Reports he eats chicken and meat at home. His depakote is currently at 750 mg BID.    Denies fevers, chills, headaches, chest pain, shortness of breath, nausea, vomiting, abdominal pain, diarrhea, dysuria, hematuria, ulcers, arthralgias, syncope or seizures.     ROS:  11 systems reviewed and negative except those noted in the history of present illness    PAST MEDICAL HISTORY:  Past Medical History:   Diagnosis Date   ??? Anxiety    ??? Bipolar 1 disorder (CMS-HCC)    ??? Depressive disorder    ??? Erectile dysfunction    ??? Esophageal reflux    ??? History of pyloric stenosis    ??? Hypertension    ??? Insomnia    ??? Psoriasis     Dermatologist - Dr. Deloria Lair in [facility]   ??? Seizure (CMS-HCC) 02/20/2019   ??? Tobacco use     1ppd       ALLERGIES  Tramadol and Hydroxyzine    SOCIAL HISTORY  Social History     Socioeconomic History   ??? Marital status: Married     Spouse name: Not on file   ??? Number of children: Not on file   ??? Years of education: Not on file   ??? Highest education level: Not on file   Occupational History   ??? Not on file   Tobacco Use   ??? Smoking status: Current Every Day Smoker     Packs/day: 1.00  Years: 30.00     Pack years: 30.00     Types: Cigarettes   ??? Smokeless tobacco: Never Used   Vaping Use   ??? Vaping Use: Never used   Substance and Sexual Activity   ??? Alcohol use: Yes     Comment: 3 drinks a day.   ??? Drug use: Yes     Types: Marijuana   ??? Sexual activity: Not on file   Other Topics Concern   ??? Do you use sunscreen? Yes   ??? Tanning bed use? Yes   ??? Are you easily burned? Yes   ??? Excessive sun exposure? No   ??? Blistering sunburns? Yes   Social History Narrative    Updated 01/03/2020        Lives in [place] with wife and two children.  Currently unemployed had Worked at [employer].  Current smoker.History of excessive ETOh abuse has been sober since September 2021.Marland Kitchen           PSYCHIATRIC HX:     -Current provider(s):  [place] Dr. Roxanne Gates     -Suicide attempts/SIB: NO    -Psych Hospitalizations:  2014 for depression at [facility]    -Med compliance hx: Poor,      -Fa hx suicide: YES, sister attempted         SUBSTANCE ABUSE HX:     -Current using substance: sig hx of alcohol use (last drink Sept 2021) and was hospitalized for withdrawal with DT    -Hx w/d sxs: YES    -Sz Hx: YES,    -DT Hx:YES, Sept 2021        SOCIAL HX:    -Current living environment: lives in [place] with wife and 2 childrne     -Current support: wife, father     -Violence (perp): verbal, has been kicking walls     -Access to Firearms: NO        -Guardian: NO        -Trauma: NO             Social Determinants of Health     Financial Resource Strain: Low Risk    ??? Difficulty of Paying Living Expenses: Not hard at all   Food Insecurity: No Food Insecurity   ??? Worried About Programme researcher, broadcasting/film/video in the Last Year: Never true   ??? Ran Out of Food in the Last Year: Never true   Transportation Needs: No Transportation Needs   ??? Lack of Transportation (Medical): No   ??? Lack of Transportation (Non-Medical): No   Physical Activity: Not on file   Stress: Not on file   Social Connections: Not on file         FAMILY HISTORY  Family History   Problem Relation Age of Onset   ??? Bipolar disorder Mother    ??? Depression Sister    ??? Alcohol abuse Sister    ??? Alcohol abuse Maternal Aunt    ??? Melanoma Neg Hx    ??? Basal cell carcinoma Neg Hx    ??? Squamous cell carcinoma Neg Hx         MEDICATIONS:  Current Outpatient Medications   Medication Sig Dispense Refill   ??? acetaminophen (TYLENOL) 500 MG tablet Take 500 mg by mouth every six (6) hours as needed for pain.     ??? ADALIMUMAB SYRINGE CITRATE FREE 40 MG/0.4 ML Inject the contents of 1 syringe (40 mg) under the skin every 14 days 4 each 1   ???  amLODIPine (NORVASC) 5 MG tablet Take 1 tablet (5 mg total) by mouth daily. 30 tablet 11   ??? calcium carbonate (TUMS) 200 mg calcium (500 mg) chewable tablet Chew 2 tablets daily as needed for heartburn.     ??? calcium carbonate/vitamin D3 (CALCIUM 500 + D ORAL) Take 1 tablet by mouth daily.     ??? cetirizine (ZYRTEC) 10 MG tablet Take 10 mg by mouth daily.     ??? divalproex ER (DEPAKOTE ER) 250 MG extended release 24 hr tablet TAKE 3 TABLETS(750 MG) BY MOUTH EVERY NIGHT 270 tablet 1   ??? fluticasone propionate (FLONASE) 50 mcg/actuation nasal spray 2 sprays into each nostril daily. 16 g 12   ??? folic acid (FOLVITE) 1 MG tablet Take 1 tablet (1 mg total) by mouth daily. 30 tablet 11   ??? gabapentin (NEURONTIN) 100 MG capsule 100mg  each morning and 200mg  each night 180 capsule 0   ??? magnesium oxide (MAG-OX) 400 mg (241.3 mg elemental magnesium) tablet Take 2 tablets (800 mg total) by mouth daily. 120 tablet 0   ??? melatonin 3 mg Tab Take 1 tablet (3 mg total) by mouth every evening. 30 tablet 0   ??? multivitamin,tx-iron-minerals (THERA-M) Tab Take 1 tablet by mouth daily. 30 tablet 0   ??? naltrexone (DEPADE) 50 mg tablet Take 1 tablet (50 mg total) by mouth daily. 30 tablet 0   ??? nicotine (NICODERM CQ) 21 mg/24 hr patch Place 1 patch on the skin daily. 28 patch 5   ??? OLANZapine (ZYPREXA) 20 MG tablet Take 1 tablet (20 mg total) by mouth nightly. 30 tablet 0   ??? omeprazole (PRILOSEC) 20 MG capsule TAKE 1 CAPSULE(20 MG) BY MOUTH EVERY DAY 90 capsule 0   ??? sennosides (SENNA LAX ORAL) Take 1 tablet by mouth daily as needed (constipation).     ??? sodium chloride 1 gram tablet Take 1 tablet (1 g total) by mouth Three (3) times a day with a meal. 100 tablet 2     No current facility-administered medications for this visit.       PHYSICAL EXAM:     There were no vitals filed for this visit.     Telephone visit, patient sounds well at this time.    MEDICAL DECISION MAKING    Results for orders placed or performed in visit on 04/11/20   Lithium level   Result Value Ref Range    Lithium Lvl <0.1 (L) 0.5 - 1.2 mmol/L   Basic Metabolic Panel   Result Value Ref Range    Sodium 128 (L) 135 - 145 mmol/L    Potassium 3.9 3.4 - 4.8 mmol/L    Chloride 90 (L) 98 - 107 mmol/L    CO2 22.0 20.0 - 31.0 mmol/L    Anion Gap 16 (H) 5 - 14 mmol/L    BUN 7 (L) 9 - 23 mg/dL    Creatinine 1.61 0.96 - 1.10 mg/dL    BUN/Creatinine Ratio 9     EGFR CKD-EPI Non-African American, Male >90 >=60 mL/min/1.48m2    EGFR CKD-EPI African American, Male >90 >=60 mL/min/1.22m2    Glucose 91 70 - 179 mg/dL    Calcium 9.7 8.7 - 04.5 mg/dL   Valproic acid level, total   Result Value Ref Range    Valproic Acid, Total 50.1 50.0 - 100.0 ug/mL   Magnesium Level   Result Value Ref Range    Magnesium 1.2 (L) 1.6 - 2.6 mg/dL  Creatinine   Date Value Ref Range Status   04/11/2020 0.78 0.60 - 1.10 mg/dL Final   96/29/5284 1.32 0.60 - 1.10 mg/dL Final   44/01/270 5.36 0.60 - 1.10 mg/dL Final   64/40/3474 2.59 0.60 - 1.10 mg/dL Final   56/38/7564 3.32 0.60 - 1.10 mg/dL Final   95/18/8416 6.06 0.70 - 1.30 mg/dL Final   30/16/0109 3.23 0.70 - 1.30 mg/dL Final   55/73/2202 5.42 0.70 - 1.30 mg/dL Final     No results found for: GFR   BUN   Date Value Ref Range Status   04/11/2020 7 (L) 9 - 23 mg/dL Final   70/62/3762 14 9 - 23 mg/dL Final   83/15/1761 12 9 - 23 mg/dL Final   60/73/7106 9 9 - 23 mg/dL Final   26/94/8546 11 9 - 23 mg/dL Final   27/03/5007 12 7 - 21 mg/dL Final   38/18/2993 8 7 - 21 mg/dL Final      Phosphorus   Date Value Ref Range Status   02/23/2020 2.8 2.4 - 5.1 mg/dL Final   71/69/6789 4.3 2.4 - 5.1 mg/dL Final   38/10/1749 2.8 2.4 - 5.1 mg/dL Final   02/58/5277 4.6 2.9 - 4.7 mg/dL Final      Calcium   Date Value Ref Range Status   04/11/2020 9.7 8.7 - 10.4 mg/dL Final   82/42/3536 9.6 8.7 - 10.4 mg/dL Final   14/43/1540 9.2 8.7 - 10.4 mg/dL Final   08/67/6195 9.3 8.7 - 10.4 mg/dL Final   09/32/6712 9.0 8.7 - 10.4 mg/dL Final   45/80/9983 9.1 8.5 - 10.2 mg/dL Final   38/25/0539 9.2 8.5 - 10.2 mg/dL Final      No results found for: PTH   HGB   Date Value Ref Range Status   03/18/2020 13.3 (L) 13.5 - 17.5 g/dL Final   76/73/4193 79.0 (L) 13.5 - 17.5 g/dL Final   24/09/7351 29.9 (L) 13.5 - 17.5 g/dL Final   24/26/8341 96.2 (L) 13.5 - 17.5 g/dL Final   22/97/9892 11.9 (L) 13.5 - 17.5 g/dL Final   41/74/0814 48.1 13.5 - 17.5 g/dL Final   85/63/1497 02.6 13.5 - 17.5 g/dL Final      CO2   Date Value Ref Range Status   04/11/2020 22.0 20.0 - 31.0 mmol/L Final   03/24/2020 25.7 20.0 - 31.0 mmol/L Final   03/23/2020 22.3 20.0 - 31.0 mmol/L Final   03/22/2020 24.7 20.0 - 31.0 mmol/L Final   03/21/2020 24.3 20.0 - 31.0 mmol/L Final   06/07/2013 26 22 - 30 mmol/L Final   05/11/2012 20 (L) 22 - 30 mmol/L Final         IMAGING STUDIES:  -CTAP 03/14/20: Kidneys show unchanged subcentimeter right hypodensity, too small to characterize on CT. Symmetric enhancement kidneys. No hydronephrosis.      ASSESSMENT/PLAN:  Mr.Nicholas Rush is a 55 y.o. patient with chronic hyponatremia (baseline in low 130s), bipolar 1 disorder, alcohol abuse, HTN, psoriasis on Humira who is being seen for evaluation of chronic hyponatremia.    Chronic Hyponatremia w/ recent episode of acute on chronic hyponatremia  -Episode in February 2022 initially appears to have been volume down and improved with fluids. 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  -CT lung scan and CTAP without signs of malignancy. CT head and prior MRI without malignant findings. Need C-scope with PCP if not UTD.  -Continue NaCl tablets at 1 g TID  -Encouraged to cut down on alcohol use  -Checking BMP, serum osm, urine osm, and urine Na  -Will arrange for in person follow up with patient, as nephrology had never met patient in-person and would like to evaluate volume status    Mr.Nicholas Rush will follow up in 1 month    Patient was seen and discussed with Dr. Margaretmary Bayley who is in agreement with the plan.        The patient reports they are currently: at home. I spent 15 minutes on the phone with the patient on the date of service. I spent an additional 10 minutes on pre- and post-visit activities on the date of service.     The patient was physically located in [place] or a state in which I am permitted to provide care. The patient and/or parent/guardian understood that s/he may incur co-pays and cost sharing, and agreed to the telemedicine visit. The visit was reasonable and appropriate under the circumstances given the patient's presentation at the time.    The patient and/or parent/guardian has been advised of the potential risks and limitations of this mode of treatment (including, but not limited to, the absence of in-person examination) and has agreed to be treated using telemedicine. The patient's/patient's family's questions regarding telemedicine have been answered.     If the visit was completed in an ambulatory setting, the patient and/or parent/guardian has also been advised to contact their provider???s office for worsening conditions, and seek emergency medical treatment and/or call 911 if the patient deems either necessary.

## 2020-04-23 NOTE — Unmapped (Signed)
OUTPATIENT OCCUPATIONAL THERAPY   Note Type: Treatment Note       Patient Name: Nicholas Rush  Date of Birth:1966-01-14  Date of Visit: 04/23/2020  Visit #:  2/10 towards re-assessment, 2 total.  Date of Injury/Onset: 2022  Date of Evaluation: 04/05/2020  Referring Physician: Bryn Gulling  Reason for Referral: Eval and Treat  Plan of Care: 04/11/2020 - 04/24/2020, 3 units  Encounter Diagnoses   Name Primary?   ??? Recurrent falls    ??? Fine motor impairment Yes   ??? Decreased strength        ASSESSMENT  55 y.o. year old right handed male with history of history of frequent hospitalizations over the past months due to mania from likely bipolar 1 disorder, TBI, and alcohol use disorder, who presents with impaired balance, fall risk, impaired bilateral FMC, decreased strength in BUE, general deconditioning, and limited motivation impairing participation in ADLs and IADLs.   This session, provided theraputty HEP with pt demonstrating mild difficulty with fine motor movements in left hand but good tolerance of exercises.  Patient requires skilled outpatient Occupational Therapy services to address the above deficits and maximize independence with ADLs and IADLs in home environment.    Patient wore a mask for the entire therapy session. and Therapist wore a mask for the entire session.       Complexity:  Moderate Complexity Eval Code: Clinical decision making was of moderate complexity, as data from the client???s history, profile, detailed assessments, and consideration of several treatment options.  Patient presents with comorbidities that affect occupational performance.  Minimal to moderate modification of tasks is necessary.      Long Term Goals:   1. In 9 sessions, patient will perform upgraded home exercise program independently  to max IND with ADLs and IADLs.  2. In 9 sessions, patient will complete showering task with supervision using AE as needed (TTB, long handled bath sponge) for improved IND and safety with task.  3. In 9 sessions, patient will independently tie shoelaces to max IND with LBD task  4. In 9 sessions, patient will report implementation and completion of daily hygiene routine 7/7 days a week to max IND with grooming tasks.   5. In 9 sessions, patient will demonstrate improved grip strength of 5 or more lbs for improved functional use of BUE in ADLs and IADLs. (to 37 L, 41 R)  6. In 9 sessions, Pt will demonstrate improved right fine motor coordination as evidenced by completing 9HPT in 35 or fewer sec for improved participation in ADLs and IADLs.  7. In 9 sessions, Pt will demonstrate improved left fine motor coordination as evidenced by placing 5 or more pegs in 50 seconds or less in 9HPT for improved participation in ADLs and IADLs.        Patient in agreement with plan of care?: yes    Prognosis for goal achievement: Fair due to supportive family, motivation, history of non-compliance and behavior.    PLAN  Pt. will participate in:  Therapeutic Exercise  Therapeutic Acitivity  Manual Therapy  Neuromuscular Re-education  Orthotic management  Self-care home training  Energy conservation  Hot/Cold pack  Cognitive skills  Adaptive device  Assistive technology    Next session: make oval 8 splint for pinky (pre-fab ones didn't fit right)    Planned frequency and duration of treatment:  2x a week for 4 weeks.  Plan will be adjusted as needed.     Past Medical History:  Past Medical History:   Diagnosis Date   ??? Anxiety    ??? Bipolar 1 disorder (CMS-HCC)    ??? Depressive disorder    ??? Erectile dysfunction    ??? Esophageal reflux    ??? History of pyloric stenosis    ??? Hypertension    ??? Insomnia    ??? Psoriasis     Dermatologist - Dr. Deloria Lair in [facility]   ??? Seizure (CMS-HCC) 02/20/2019   ??? Tobacco use     1ppd       Past Surgical History:   Past Surgical History:   Procedure Laterality Date   ??? PYLOROMYOTOMY     ??? WISDOM TOOTH EXTRACTION         SUBJECTIVE:  Patient reported he hasn't been doing much in terms of exercise at the moment, feeling unsteady on his feet for walking with rollator    History of Present Condition: Per referring physician's note:  Reason for Admission: Nicholas Rush is a 55 y.o. male with a history of mania from likely bipolar 1 disorder, TBI, alcohol use disorder, and chronic hyponatremia who was admitted voluntarily to the [facility] Stabilization unit for safety, stabilization, and management of medications. Please refer to full History and Physical note for details.         Precautions: Fall Risk    Prior Therapies: Inpatient/Outpatient OT and PT    Patient???s Goals: To be able to shower.    Pain  Pain present?: no pain reported     Social History:   Lives with: Wife (Christine), Alex, and pt's wife daughter   Stories: 1SH with handicap ramp  Bathroom Layout: Tub shower   Home Equipment: TTB, transport w/c, non-skid mat, rolling walker, raised toilet with arm rests, urinal     PLOF: Recently diagnosed with Bipolar Disorder in past year    OBJECTIVE  Therapeutic Exercise  Reviewed HEP of theraputty strengthening exercises with pink colored putty  Emphasized pinching, twisting, pulling, extending digits, and manipulating to increase intrinsic hand muscle strength and improve overall grip  Patient demonstrated good skill, challenge with left hand and pinky  Would benefit from oval 8 custom splint to L pinky  Tried size 8 and 9 but unable to get over PIP safely without causing risk of pressure sore      Total Treatment Time:  40 minutes    Education:  Topics:Disease Process   Education Provided to: patient and family  Education Type:Explanation  Response to education/teachback:Verbal understanding recieved    I reviewed the no-show/attendance policy with the patient and caregiver(s). The family is aware that they must call to cancel appointments more than 24 hours in advance. They are also aware that if they late cancel or no-show three times, we reserve the right to cancel their remaining appointments. This policy is in place to allow [place] to best serve the needs of our caseload.       I attest that I have reviewed the above information.  Signed: Earlie Counts, OT 04/23/2020 1:41 PM

## 2020-04-23 NOTE — Unmapped (Signed)
The care for this patient was completed by Pat Sires, SPT and Rhea Belton, DPT:  A student was present and participated in the care. Licensed/Credentialed therapist was physically present and immediately available to direct and supervise tasks that were related to patient management. The direction and supervision was continuous throughout the time these tasks were performed.    Vira Blanco, DPT    NEUROLOGICAL OUTPATIENT PHYSICAL THERAPY  Daily Note      Patient Name: Nicholas Rush  Date of Birth:September 20, 1965  Date: 04/23/2020  Session Number:  2 (1/10 to reassessment)  Therapy Diagnosis:   Encounter Diagnoses   Name Primary?   ??? Recurrent falls Yes   ??? Impaired functional mobility, balance, gait, and endurance    ??? Muscle weakness (generalized)    ??? Alcohol withdrawal seizure with complication (CMS-HCC)        Date of Injury/Onset: 03/15/2020  Date of Evaluation: 04/23/2020  Referring Pracitioner: Bryn Gulling   Certification Dates: 04/22/20 - 06/02/20 12 visits     Patient wore a mask for the entire therapy session., Patient's companion wore a mask for the entire therapy session., Therapist wore a mask for the entire session. , Therapist wore eye goggles/frames during the entire session.  and PT student wore mask and eye protection entire session.    ASSESSMENT:    55 y.o. male presents with history of recurrent falls and multiple hospitilizations, resulting in severe endurance, strength, and balance deficits, limited activity tolerance, impaired posture, and back pain which are limiting the patient's independence, safety, and functional mobility. Today's session focused on building endurance and re-establishing the patient's HEP. His posture continues to be impaired with walking and continues to have low endurance, needing rest after 1 min of walking. Patient was educated on the importance of doing his HEP outside of therapy as well as the importance of attending therapy sessions. He is expected to benefit from continued physical therapy interventions to improve his endurance, strength and walking tolerance.     Next session: LE strengthening, balance, endurance training     Outcome measures:  Test Eval (04/05/2020)    TUG 49 seconds with RW and CGA.    210 ft with rolling walker, CGA, 2 seated rest breaks    5xSTS 19.4 seconds with heavy UE use, legs hinging, rolling walker in front.    Gait speed ( ) 0.3 m/s with rolling walker       Goals    Patient/Family Goals: improve walking and mobility     Long Term Goals 8 weeks  1. Patient will be independent with upgraded HEP and transition to community based wellness program as appropriate.  2. Patient will ambulate > 400 feet with LRAD with symmetrical step length and limited negative compensatory strategies.  3. Patient will improve score on TUG to <30 seconds with LRAD to demonstrate improved mobility, balance and reduced risk of falls.  4. Patient will improve score on 5xSTS to <15 seconds with no UE support to demonstrate improved LE strength, postural control, and reduced risk of falls.  5. Patient will improve gait speed to >0.6 m/s with LRAD to demonstrate improved mobility and reduced risk of falls.    SUBJECTIVE:  Pt states: He has been doing one exercise regularly which he described as a long arc quad. He is using his wheel chair to get around the house and is rarely walking because he says his legs feel unsteady.     Reason for Referral/History of  Present Condition/Onset of injury/exacerbation:   55 y.o. male presents to the [facility] with  a past medical history significant for  Bipolar Disorder type 1, alcohol use disorder with hx of complicated withdrawal, HTN, psoriasis on adalimumab, and chronic hyponatermia who presents with altered mental status in the setting of hyponatremia, now improving. .       OBJECTIVE :  Pain: 0/10  Falls: none     TherEx: 35 min  - NuStep 8. , no resistance, 57 SPM, 0.26 mi  - Sit to stand x 5 with UE support, patient cued to scoot forward and bring trunk forward before standing to limit use of legs against back of mat  Needs CGA   - Scapular retractions 5 x 15 seconds   - Pelvic tilts with adduction ball squeeze 5 x 5 seconds, patient cued to engage core   - SLR x 5 bilaterally - patient reported a stretch like feeling in his low back   - Knees to chest 1 x 30 seconds bilaterally - patient instructed to perform with towel under knee for increased comfort and control   - Supine Heel slide x 5 bilaterally     TherAct:  -supine to sit transfer x 1- patient instructed in proper transfer technique from supine to sit to limit strain on low back   -ambulation 65 feet in 1 min x 2, patient fatigued after of walking. Demonstrated forward trunk lean and cued to squeeze glutes to bring hips forward and retract shoulders to open chest, displayed improved posture on second trial.     HEP:  Patient given updated hand out from [facility] (04/23/20) - Educated on the importance of keeping up with HEP to help maintain gains that occur in therapy.   Pelvic tilts  Sit<>stand  Shoulder blade squeeze  SLR  Adduction ball squeeze   Heel slides   Knees to chest     Patient Education: Throughout evaluation patient educated regarding the following: Role of PT in Rehabilitation, HEP, importance of therapy, equipment recommendations, posture, body mechanics, treatment plan and Indications/Contraindications to Exercises. Patient demonstrated and verbalized agreement and understanding.        Communication/consultation with other professionals: discussed POC with PT.     Total Time: 45 min    Treatment Rendered:    Therapeutic Exercise: 35 min  Therapeutic Activity: 10 min    I attest that I have reviewed the above information.  SignedAleatha Borer, SPT  04/23/2020 12:47 PM

## 2020-04-26 NOTE — Unmapped (Signed)
[facility]  [address] [place], [place] [address]    [phone]    [name] did not show for his  scheduled Occupational Therapy follow-up session. Patient called to cancel morning of due to lack of transportation.  Please contact me if you have any questions or concerns.     Thank you for this referral,     Signed: Bernadette Hoit, OT  04/26/2020 11:16 AM

## 2020-04-26 NOTE — Unmapped (Signed)
Pts wife Nicholas Rush called. Concerned that he is going downhill. Couldn't got to PT today because he couldn't get out of the bed. Requests a call from Dr. Cathie Hoops    [name] # [phone]

## 2020-04-26 NOTE — Unmapped (Signed)
Called [name]'s cell phone #, unable to leave VM.

## 2020-04-26 NOTE — Unmapped (Signed)
[facility]  [address] [place], [place] [address]    [phone]    [name] did not show for his  scheduled Physical Therapy follow-up session. He called this morning to cancel due to transportation issues.  Please contact me if you have any questions or concerns.     Thank you for this referral,     Signed: Christoper Allegra, PT  04/26/2020 1:03 PM

## 2020-04-28 MED ORDER — OMEPRAZOLE 20 MG CAPSULE,DELAYED RELEASE
ORAL_CAPSULE | 0 refills | 0 days
Start: 2020-04-28 — End: ?

## 2020-04-29 ENCOUNTER — Telehealth: Admit: 2020-04-29 | Discharge: 2020-04-30 | Payer: MEDICAID | Attending: Psychiatry | Primary: Psychiatry

## 2020-04-29 DIAGNOSIS — E871 Hypo-osmolality and hyponatremia: Principal | ICD-10-CM

## 2020-04-29 DIAGNOSIS — F1020 Alcohol dependence, uncomplicated: Principal | ICD-10-CM

## 2020-04-29 DIAGNOSIS — F319 Bipolar disorder, unspecified: Principal | ICD-10-CM

## 2020-04-29 DIAGNOSIS — Z79899 Other long term (current) drug therapy: Principal | ICD-10-CM

## 2020-04-29 MED ORDER — DIVALPROEX ER 250 MG TABLET,EXTENDED RELEASE 24 HR
ORAL_TABLET | Freq: Every evening | ORAL | 1 refills | 90 days | Status: CP
Start: 2020-04-29 — End: ?

## 2020-04-29 MED ORDER — OLANZAPINE 20 MG TABLET
ORAL_TABLET | Freq: Every evening | ORAL | 0 refills | 30 days | Status: CP
Start: 2020-04-29 — End: 2020-05-29

## 2020-04-29 MED ORDER — NALTREXONE 50 MG TABLET
ORAL_TABLET | Freq: Every day | ORAL | 0 refills | 30 days | Status: CP
Start: 2020-04-29 — End: ?

## 2020-04-29 NOTE — Unmapped (Signed)
Patient is requesting the following refill  Requested Prescriptions     Pending Prescriptions Disp Refills   ??? omeprazole (PRILOSEC) 20 MG capsule [Pharmacy Med Name: OMEPRAZOLE 20MG  CAPSULES] 90 capsule 0     Sig: TAKE 1 CAPSULE(20 MG) BY MOUTH EVERY DAY       Recent Visits  Date Type Provider Dept   04/01/20 Telemedicine Rupal Doreatha Lew, MD [facility]   03/25/20 Office Visit Rupal Doreatha Lew, MD [facility]   02/22/20 Office Visit Rupal Doreatha Lew, MD [facility]   12/13/19 Office Visit Mar Daring, MD [facility] [place]   11/09/19 Office Visit Lavell Islam, MD [facility] [place]   07/03/19 Office Visit Mar Daring, MD [facility]   Showing recent visits within past 365 days with a meds authorizing provider and meeting all other requirements  Future Appointments  No visits were found meeting these conditions.  Showing future appointments within next 365 days with a meds authorizing provider and meeting all other requirements       Labs: Not applicable this refill

## 2020-04-29 NOTE — Unmapped (Addendum)
Spoke with patient.  Was previously using public transportation, but he couldn't get on the bus because he was so weak.  Was told he would need someone to accompany him, but he doesn't have anyone. Overall feels like his weakness is getting worse.    Sleeping a lot, 10-12 hours per night.  Using a rolling chair (not a rollator, but an actual chair on wheels) to get around his house.  3 drinks on some day -- last drink was on 4/9.     Overall, it sounds like he has declined significantly over the past few weeks.   - he will try to get to the [facility] lab for labs order by Nephrology -- lab closes at 4:30pm in [place], but GF doesn't get off work until 6pm.   - will re-order [facility] RN/PT, perhaps RN can draw labs.    - f/u by video visit in 1 week  - adherent to TID salt tabs per his report, urged to work on fluid restriction

## 2020-04-29 NOTE — Unmapped (Signed)
We made the following medication changes today:  No changes     Follow-up instructions:  -- Please continue taking your medications as prescribed for your mental health.   -- Do not make changes to your medications, including taking more or less than prescribed, unless under the supervision of your physician. Be aware that some medications may make you feel worse if abruptly stopped  -- Please refrain from using illicit substances, as these can affect your mood and could cause anxiety or other concerning symptoms.   -- Seek further medical care for any increase in symptoms or new symptoms such as thoughts of wanting to hurt yourself or hurt others.   -- Please follow-up 06/10/20 9:15AM, 6/27 11:15AM     Contact info:  Life-threatening emergencies: Call 911 or go to the nearest ER for medical or psychiatric attention.      Issues that need urgent attention but are not life threatening: Call the clinic outpatient frontdesk at [phone] for assistance.      Non-urgent routine concerns, questions, and refill requests: The best way to reach me is through a MyChart message and I Davien Malone get back to you within 2 business days.      Regarding appointments:  - If you need to cancel your appointment, we ask that you call [phone] at least 24 hours before your scheduled appointment.  - If for any reason you arrive 15 minutes later than your scheduled appointment time, you may not be seen and your visit may be rescheduled.  - Please remember that we Rashawna Scoles not automatically reschedule missed appointments.  - If you miss two (2) appointments without letting us know in advance, you Carliss Quast likely be referred to a provider in your community.  - We Ieesha Abbasi do our best to be on time. Sometimes an emergency Helen Cuff arise that might cause your clinician to be late. We Jilda Kress try to inform you of this when you check in for your appointment. If you wait more than 15 minutes past your appointment time without such notice, please speak with the front desk staff.     In the event of bad weather, the clinic staff Ceceilia Cephus attempt to contact you, should your appointment need to be rescheduled. Additionally, you can call the Patient Weather Line [phone] for system-wide clinic status     For more information and reminders regarding clinic policies (these were provided when you were admitted to the clinic), please ask the front desk.
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
## 2020-04-29 NOTE — Unmapped (Signed)
Addended by: Marchia Bond on: 04/29/2020 10:54 AM     Modules accepted: Orders

## 2020-04-30 NOTE — Unmapped (Signed)
We have received the referral you have placed for this patient. Please be advised the patient has OP therapy , until the patient is discharged from OP we are not able to admit the patient as per federal regulations.     Thank you ,   If we can be of any further assistance please let us know

## 2020-05-01 MED ORDER — SODIUM CHLORIDE 1,000 MG SOLUBLE TABLET
0 days
Start: 2020-05-01 — End: ?

## 2020-05-03 NOTE — Unmapped (Signed)
[facility]  [address] [place], [place] [address]    [phone]    Nicholas Rush did not show for his  scheduled Physical Therapy follow-up session.  Please contact me if you have any questions or concerns.     Thank you for this referral,     Signed: Christoper Allegra, PT  05/03/2020 8:49 AM

## 2020-05-06 ENCOUNTER — Telehealth: Admit: 2020-05-06 | Discharge: 2020-05-07 | Payer: MEDICAID

## 2020-05-06 DIAGNOSIS — I10 Essential (primary) hypertension: Principal | ICD-10-CM

## 2020-05-06 DIAGNOSIS — E871 Hypo-osmolality and hyponatremia: Principal | ICD-10-CM

## 2020-05-06 DIAGNOSIS — R296 Repeated falls: Principal | ICD-10-CM
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
## 2020-05-06 NOTE — Unmapped (Signed)
Patient has cancelled all outpatient PT.    His condition has worsened and he has not left the home since 4/8  Please initial [place] services

## 2020-05-06 NOTE — Unmapped (Signed)
error 

## 2020-05-06 NOTE — Unmapped (Addendum)
Coffee this morning may explain pulse of 112.  BP check 124/89  I decreased amldopine from 10 to 5mg  in the context of recent weakness, may need to titrate back up later  Switched from lisinopril 20mg  to amlodipine 10mg  during the last hospitalization

## 2020-05-06 NOTE — Unmapped (Addendum)
Generalized weakness, particularly in the lower legs, after most recent hospital stay  Using a rolling stool to push himself around the home, and is stand by assist for showering.  Denies new pain, numbness or tingling in lower extremities that would suggest a new process.  All outpatient PT appts have been cancelled, as he physically cannot leave the home-- was asked by public transit on 4/8 not to attempt to board the bus again.   - would benefit from [facility] RN for lab draw and [facility] PT for strengthening  - I called [facility] and asked them to please re-eval after our visit

## 2020-05-06 NOTE — Unmapped (Deleted)
[facility] STEP Primary Care Clinic - Dava Najjar, [place] [address]  Dr. Marchia Bond - Family Medicine      Assessment/ Plan:    Problem List Items Addressed This Visit     None            Preventive Care  Health Maintenance Due   Topic Date Due   ??? Zoster Vaccines (1 of 2) Never done   ??? DTaP/Tdap/Td Vaccines (2 - Td or Tdap) 02/21/2019   ??? FIT-DNA Stool Test  06/17/2019   ??? COVID-19 Vaccine (3 - Booster for Moderna series) 11/08/2019       Medication adherence and barriers to the treatment plan have been addressed. Opportunities to optimize healthy behaviors have been discussed. Patient / caregiver voiced understanding.    RETURN TO CARE:  No follow-ups on file.    Future Appointments   Date Time Provider Department Center   05/16/2020  1:00 PM Durenda Hurt, MD [facility]   06/03/2020 10:30 AM Will Karsten Fells, MD [facility]   07/08/2020  9:15 AM Will Karsten Fells, MD [facility]       {Optional Time Based Coding:73335}    _____________________________________________________________________    Subjective:  CC: No chief complaint on file.      Patient Care Team:  Chivas Notz Doreatha Lew, MD as PCP - General (Family Medicine)  Mar Daring, MD as PCP - Rande Brunt    HPI: Nicholas Rush is a 55 y.o. male here to address the concerns above.        BP Readings from Last 3 Encounters:   04/11/20 140/81   03/25/20 106/73   03/24/20 120/73       Wt Readings from Last 3 Encounters:   03/25/20 89.6 kg (197 lb 8 oz)   03/19/20 89.4 kg (197 lb 1.5 oz)   03/09/20 90.4 kg (199 lb 3 oz)         Medications:     Current Outpatient Medications:   ???  acetaminophen (TYLENOL) 500 MG tablet, Take 500 mg by mouth every six (6) hours as needed for pain., Disp: , Rfl:   ???  ADALIMUMAB SYRINGE CITRATE FREE 40 MG/0.4 ML, Inject the contents of 1 syringe (40 mg) under the skin every 14 days, Disp: 4 each, Rfl: 1  ???  amLODIPine (NORVASC) 5 MG tablet, Take 1 tablet (5 mg total) by mouth daily., Disp: 30 tablet, Rfl: 11  ???  calcium carbonate (TUMS) 200 mg calcium (500 mg) chewable tablet, Chew 2 tablets daily as needed for heartburn., Disp: , Rfl:   ???  calcium carbonate/vitamin D3 (CALCIUM 500 + D ORAL), Take 1 tablet by mouth daily., Disp: , Rfl:   ???  cetirizine (ZYRTEC) 10 MG tablet, Take 10 mg by mouth daily., Disp: , Rfl:   ???  divalproex ER (DEPAKOTE ER) 250 MG extended release 24 hr tablet, Take 3 tablets (750 mg total) by mouth nightly., Disp: 270 tablet, Rfl: 1  ???  fluticasone propionate (FLONASE) 50 mcg/actuation nasal spray, 2 sprays into each nostril daily., Disp: 16 g, Rfl: 12  ???  folic acid (FOLVITE) 1 MG tablet, Take 1 tablet (1 mg total) by mouth daily., Disp: 30 tablet, Rfl: 11  ???  gabapentin (NEURONTIN) 100 MG capsule, 100mg  each morning and 200mg  each night, Disp: 180 capsule, Rfl: 0  ???  magnesium oxide (MAG-OX) 400 mg (241.3 mg elemental magnesium) tablet, Take 2 tablets (800 mg total) by mouth daily., Disp: 120  tablet, Rfl: 0  ???  multivitamin,tx-iron-minerals (THERA-M) Tab, Take 1 tablet by mouth daily., Disp: 30 tablet, Rfl: 0  ???  naltrexone (DEPADE) 50 mg tablet, Take 1 tablet (50 mg total) by mouth daily., Disp: 30 tablet, Rfl: 0  ???  nicotine (NICODERM CQ) 21 mg/24 hr patch, Place 1 patch on the skin daily., Disp: 28 patch, Rfl: 5  ???  OLANZapine (ZYPREXA) 20 MG tablet, Take 1 tablet (20 mg total) by mouth nightly., Disp: 30 tablet, Rfl: 0  ???  omeprazole (PRILOSEC) 20 MG capsule, TAKE 1 CAPSULE(20 MG) BY MOUTH EVERY DAY, Disp: 90 capsule, Rfl: 0  ???  sennosides (SENNA LAX ORAL), Take 1 tablet by mouth daily as needed (constipation)., Disp: , Rfl:   ???  sodium chloride 1 gram tablet, Take 1 tablet (1 g total) by mouth Three (3) times a day with a meal., Disp: 100 tablet, Rfl: 2      I have reviewed the patient's problem list, current medications, allergies, and social history and updated them as needed.       Objective:  PE: There were no vitals taken for this visit.   {Optional Physical Exam:73334}

## 2020-05-10 DIAGNOSIS — R5381 Other malaise: Principal | ICD-10-CM

## 2020-05-10 DIAGNOSIS — E871 Hypo-osmolality and hyponatremia: Principal | ICD-10-CM

## 2020-05-10 DIAGNOSIS — R296 Repeated falls: Principal | ICD-10-CM

## 2020-05-15 NOTE — Unmapped (Signed)
Nicholas Rush says he has not missed any doses of Humira.  He was in the hospital for a few weeks and believes hospital was using their stock.  He is 5 days late for this dose and will take it this Sunday.  Will then start every 2 week cycle from 05/02.  He has not noticed any breakouts nor any side effects.      [facility] Clinical Assessment & Refill Coordination Note    Nicholas Rush, DOB: [birth year]  Phone: [phone] (home)     All above HIPAA information was verified with patient.     Was a Nurse, learning disability used for this call? No    Specialty Medication(s):   Inflammatory Disorders: Humira     Current Outpatient Medications   Medication Sig Dispense Refill   ??? acetaminophen (TYLENOL) 500 MG tablet Take 500 mg by mouth every six (6) hours as needed for pain.     ??? ADALIMUMAB SYRINGE CITRATE FREE 40 MG/0.4 ML Inject the contents of 1 syringe (40 mg) under the skin every 14 days 4 each 1   ??? amLODIPine (NORVASC) 5 MG tablet Take 1 tablet (5 mg total) by mouth daily. 30 tablet 11   ??? calcium carbonate (TUMS) 200 mg calcium (500 mg) chewable tablet Chew 2 tablets daily as needed for heartburn.     ??? calcium carbonate/vitamin D3 (CALCIUM 500 + D ORAL) Take 1 tablet by mouth daily.     ??? cetirizine (ZYRTEC) 10 MG tablet Take 10 mg by mouth daily.     ??? divalproex ER (DEPAKOTE ER) 250 MG extended release 24 hr tablet Take 3 tablets (750 mg total) by mouth nightly. 270 tablet 1   ??? fluticasone propionate (FLONASE) 50 mcg/actuation nasal spray 2 sprays into each nostril daily. 16 g 12   ??? folic acid (FOLVITE) 1 MG tablet Take 1 tablet (1 mg total) by mouth daily. 30 tablet 11   ??? gabapentin (NEURONTIN) 100 MG capsule 100mg  each morning and 200mg  each night 180 capsule 0   ??? multivitamin,tx-iron-minerals (THERA-M) Tab Take 1 tablet by mouth daily. 30 tablet 0   ??? naltrexone (DEPADE) 50 mg tablet Take 1 tablet (50 mg total) by mouth daily. 30 tablet 0   ??? nicotine (NICODERM CQ) 21 mg/24 hr patch Place 1 patch on the skin daily. 28 patch 5   ??? OLANZapine (ZYPREXA) 20 MG tablet Take 1 tablet (20 mg total) by mouth nightly. 30 tablet 0   ??? omeprazole (PRILOSEC) 20 MG capsule TAKE 1 CAPSULE(20 MG) BY MOUTH EVERY DAY 90 capsule 0   ??? sennosides (SENNA LAX ORAL) Take 1 tablet by mouth daily as needed (constipation).     ??? sodium chloride 1 gram tablet Take 1 tablet (1 g total) by mouth Three (3) times a day with a meal. 100 tablet 2     No current facility-administered medications for this visit.        Changes to medications: Nicholas Rush reports no changes at this time.    Allergies   Allergen Reactions   ??? Tramadol      Risk of seizures   ??? Hydroxyzine Itching     Sedation.  Ineffective for reducing anxiety.       Changes to allergies: No    SPECIALTY MEDICATION ADHERENCE     Humira 40/0.4 mg/ml: 0 days of medicine on hand       Specialty medication(s) dose(s) confirmed: Regimen is correct and unchanged.  Are there any concerns with adherence? No    Adherence counseling provided? Not needed    CLINICAL MANAGEMENT AND INTERVENTION      Clinical Benefit Assessment:    Do you feel the medicine is effective or helping your condition? Yes    Clinical Benefit counseling provided? Not needed    Adverse Effects Assessment:    Are you experiencing any side effects? No    Are you experiencing difficulty administering your medicine? No    Quality of Life Assessment:    How many days over the past month did your psoriasis  keep you from your normal activities? For example, brushing your teeth or getting up in the morning. 0    Have you discussed this with your provider? Not needed    Acute Infection Status:    Acute infections noted within Epic:  No active infections  Patient reported infection: None    Therapy Appropriateness:    Is therapy appropriate? Yes, therapy is appropriate and should be continued    DISEASE/MEDICATION-SPECIFIC INFORMATION      For patients on injectable medications: Patient currently has 0 doses left.  Next injection is scheduled for 05/19/20.  He will be 7 days late on dose of Humira.  He will start every 2 week cycle from this Sunday.    PATIENT SPECIFIC NEEDS     - Does the patient have any physical, cognitive, or cultural barriers? No    - Is the patient high risk? No    - Does the patient require a Care Management Plan? No     - Does the patient require physician intervention or other additional services (i.e. nutrition, smoking cessation, social work)? No      SHIPPING     Specialty Medication(s) to be Shipped:   Inflammatory Disorders: Humira    Other medication(s) to be shipped: No additional medications requested for fill at this time     Changes to insurance: No    Delivery Scheduled: Yes, Expected medication delivery date: 05/17/20.     Medication will be delivered via Same Day Courier to the confirmed prescription address in [facility].    The patient will receive a drug information handout for each medication shipped and additional FDA Medication Guides as required.  Verified that patient has previously received a Conservation officer, historic buildings and a Surveyor, mining.    All of the patient's questions and concerns have been addressed.    Nicholas Rush Vangie Bicker   [facility] Specialty Pharmacist

## 2020-05-17 MED ORDER — GABAPENTIN 100 MG CAPSULE
ORAL_CAPSULE | 0 refills | 0 days | Status: CP
Start: 2020-05-17 — End: ?

## 2020-05-17 NOTE — Unmapped (Signed)
Attempted to call [name], unable to reach  Spoke with patient Nicholas Rush, who reports diarrhea for almost two weeks, now unable to make it to the BR in time.  Last shower was last week. He is worried he may have c dif again (has had it twice, so has his partner).   He has not heard from [facility].     I reviewed Dr. Jerelyn Charles note from today and agree that a hospital evaluation is best.  Patient has failed plan for home care with outpatient PT services at last discharge.  He was advised by public transit not to attempt to get on the bus again.  He has not been able get to a lab for weekly lab draw as planned at last hospital discharge two months ago.      Unfortunately, the 2nd home health referral to [facility] that I placed on 4/22 was automatically denied due to his history of drinking and bipolar disease, with no alternative agencies listed.      I strongly encouraged [name] to be evaluated in the emergency department today for diarrhea, repeat labwork, and placement.  I agree with Dr. Roxanne Gates that his mental health is stable, but he is too deconditioned (history of FIVE hospitalizations since October 2021) to remain at home.  I would advocate for him to be discharged to acute inpatient rehabilitation.

## 2020-05-17 NOTE — Unmapped (Signed)
Spoke with patient's wife [name] 1:24 - 1:35pm:    Wife reports patient is deteriorating to the point of soiling himself, not moving off the couch, urinating into bottles and becoming unmanageable to take care of. She feels psychiatrically he has stabilized but she can't tell if his apathy is secondary to severe depression or medical decline. Patient is no longer tending to hygiene or caring for themselves. Felt it was appropriate for patient to be evaluated at the hospital and if it is found that symptoms/anhedonia have psychiatric etiology then it may be worth pursuing ECT. It may be best for patient to transition to assisted living or SNF given that wife works most days and is not available to take care of patient.    Donia Ast, MD  PGY2 Psychiatry

## 2020-05-17 NOTE — Unmapped (Signed)
Patients wife called with a concern that she feels like he is not dong to well. He cant make to the rest room, she was up at 5 am this morning cleaning up feces, he just sits on the couch all day, he is using a rolling chair to get around when he does move around. She cant get him to take showers, or leave the house. Her 56 year old son had to stay home with him  today so that someone is there to clean up after him. She states that she and her daughter got sick last week with a stomach bug and they think it came from his feces that has been all over the living room floor. She states that they haven't heard form home health and and she wants to discuss with you how he is doing overall.    [phone]  [name]

## 2020-05-19 LAB — COMPREHENSIVE METABOLIC PANEL
ALBUMIN: 3.4 g/dL (ref 3.4–5.0)
ALKALINE PHOSPHATASE: 107 U/L (ref 46–116)
ALT (SGPT): 56 U/L — ABNORMAL HIGH (ref 10–49)
ANION GAP: 11 mmol/L (ref 5–14)
AST (SGOT): 141 U/L — ABNORMAL HIGH (ref <=34)
BILIRUBIN TOTAL: 0.6 mg/dL (ref 0.3–1.2)
BLOOD UREA NITROGEN: 5 mg/dL — ABNORMAL LOW (ref 9–23)
BUN / CREAT RATIO: 8
CALCIUM: 9.4 mg/dL (ref 8.7–10.4)
CHLORIDE: 96 mmol/L — ABNORMAL LOW (ref 98–107)
CO2: 26.3 mmol/L (ref 20.0–31.0)
CREATININE: 0.65 mg/dL
EGFR CKD-EPI AA MALE: >90 mL/min/{1.73_m2} (ref >=60)
EGFR CKD-EPI NON-AA MALE: >90 mL/min/{1.73_m2} (ref >=60)
GLUCOSE RANDOM: 67 mg/dL — ABNORMAL LOW (ref 70–179)
POTASSIUM: 4 mmol/L (ref 3.4–4.8)
PROTEIN TOTAL: 7.8 g/dL (ref 5.7–8.2)
SODIUM: 133 mmol/L — ABNORMAL LOW (ref 135–145)

## 2020-05-19 LAB — URINALYSIS WITH CULTURE REFLEX
BACTERIA: NONE SEEN /HPF
BILIRUBIN UA: NEGATIVE
BLOOD UA: NEGATIVE
GLUCOSE UA: NEGATIVE
KETONES UA: 15 — AB
LEUKOCYTE ESTERASE UA: NEGATIVE
NITRITE UA: NEGATIVE
PH UA: 7 (ref 5.0–9.0)
PROTEIN UA: NEGATIVE
RBC UA: 3 /HPF — ABNORMAL HIGH (ref <3)
SPECIFIC GRAVITY UA: 1.015 (ref 1.005–1.040)
SQUAMOUS EPITHELIAL: 1 /HPF (ref 0–5)
UROBILINOGEN UA: 0.2
WBC UA: 7 /HPF — ABNORMAL HIGH (ref <2)

## 2020-05-19 LAB — TOXICOLOGY SCREEN, URINE
AMPHETAMINE SCREEN URINE: NEGATIVE
BARBITURATE SCREEN URINE: NEGATIVE
BENZODIAZEPINE SCREEN, URINE: NEGATIVE
BUPRENORPHINE, URINE SCREEN: NEGATIVE
CANNABINOID SCREEN URINE: POSITIVE — AB
COCAINE(METAB.)SCREEN, URINE: NEGATIVE
FENTANYL SCREEN, URINE: NEGATIVE
METHADONE SCREEN, URINE: NEGATIVE
OPIATE SCREEN URINE: NEGATIVE
OXYCODONE SCREEN URINE: NEGATIVE

## 2020-05-19 LAB — CBC W/ AUTO DIFF
BASOPHILS ABSOLUTE COUNT: 0 10*9/L (ref 0.0–0.1)
BASOPHILS RELATIVE PERCENT: 0.8 %
EOSINOPHILS ABSOLUTE COUNT: 0.2 10*9/L (ref 0.0–0.5)
EOSINOPHILS RELATIVE PERCENT: 2.6 %
HEMATOCRIT: 41.8 % (ref 39.0–48.0)
HEMOGLOBIN: 14.7 g/dL (ref 12.9–16.5)
LYMPHOCYTES ABSOLUTE COUNT: 1.9 10*9/L (ref 1.1–3.6)
LYMPHOCYTES RELATIVE PERCENT: 33.7 %
MEAN CORPUSCULAR HEMOGLOBIN CONC: 35.1 g/dL (ref 32.0–36.0)
MEAN CORPUSCULAR HEMOGLOBIN: 36.4 pg — ABNORMAL HIGH (ref 25.9–32.4)
MEAN CORPUSCULAR VOLUME: 103.7 fL — ABNORMAL HIGH (ref 77.6–95.7)
MEAN PLATELET VOLUME: 7.4 fL (ref 6.8–10.7)
MONOCYTES ABSOLUTE COUNT: 0.5 10*9/L (ref 0.3–0.8)
MONOCYTES RELATIVE PERCENT: 9.4 %
NEUTROPHILS ABSOLUTE COUNT: 3.1 10*9/L (ref 1.8–7.8)
NEUTROPHILS RELATIVE PERCENT: 53.5 %
NUCLEATED RED BLOOD CELLS: 0 /100{WBCs} (ref <=4)
PLATELET COUNT: 157 10*9/L (ref 150–450)
RED BLOOD CELL COUNT: 4.03 10*12/L — ABNORMAL LOW (ref 4.26–5.60)
RED CELL DISTRIBUTION WIDTH: 17.5 % — ABNORMAL HIGH (ref 12.2–15.2)
WBC ADJUSTED: 5.8 10*9/L (ref 3.6–11.2)

## 2020-05-19 LAB — TSH: THYROID STIMULATING HORMONE: 0.485 u[IU]/mL — ABNORMAL LOW (ref 0.550–4.780)

## 2020-05-19 LAB — HIGH SENSITIVITY TROPONIN I - SINGLE: HIGH SENSITIVITY TROPONIN I: 7 ng/L (ref <=53)

## 2020-05-19 LAB — ETHANOL: ETHANOL: 183.2 mg/dL — ABNORMAL HIGH (ref <=10.0)

## 2020-05-19 LAB — LIPASE: LIPASE: 25 U/L (ref 12–53)

## 2020-05-19 LAB — MAGNESIUM: MAGNESIUM: 1.3 mg/dL — ABNORMAL LOW (ref 1.6–2.6)

## 2020-05-19 LAB — PHOSPHORUS: PHOSPHORUS: 3.6 mg/dL (ref 2.4–5.1)

## 2020-05-20 ENCOUNTER — Ambulatory Visit: Admit: 2020-05-20 | Discharge: 2020-06-01 | Disposition: A | Discharge status: Home Health | Payer: MEDICAID

## 2020-05-20 DIAGNOSIS — I480 Paroxysmal atrial fibrillation: Secondary | ICD-10-CM | POA: Diagnosis present

## 2020-05-20 LAB — BASIC METABOLIC PANEL
ANION GAP: 12 mmol/L (ref 5–14)
ANION GAP: 7 mmol/L (ref 5–14)
BLOOD UREA NITROGEN: <5 mg/dL — ABNORMAL LOW (ref 9–23)
BLOOD UREA NITROGEN: 11 mg/dL (ref 9–23)
BUN / CREAT RATIO: 14
CALCIUM: 8.3 mg/dL — ABNORMAL LOW (ref 8.7–10.4)
CALCIUM: 8.6 mg/dL — ABNORMAL LOW (ref 8.7–10.4)
CHLORIDE: 99 mmol/L (ref 98–107)
CHLORIDE: 103 mmol/L (ref 98–107)
CO2: 23.8 mmol/L (ref 20.0–31.0)
CO2: 25.9 mmol/L (ref 20.0–31.0)
CREATININE: 0.66 mg/dL
CREATININE: 0.81 mg/dL
EGFR CKD-EPI AA MALE: >90 mL/min/{1.73_m2} (ref >=60)
EGFR CKD-EPI AA MALE: >90 mL/min/{1.73_m2} (ref >=60)
EGFR CKD-EPI NON-AA MALE: >90 mL/min/{1.73_m2} (ref >=60)
EGFR CKD-EPI NON-AA MALE: >90 mL/min/{1.73_m2} (ref >=60)
GLUCOSE RANDOM: 67 mg/dL — ABNORMAL LOW (ref 70–179)
GLUCOSE RANDOM: 99 mg/dL (ref 70–179)
POTASSIUM: 3.7 mmol/L (ref 3.4–4.8)
POTASSIUM: 4.2 mmol/L (ref 3.4–4.8)
SODIUM: 135 mmol/L (ref 135–145)
SODIUM: 136 mmol/L (ref 135–145)

## 2020-05-20 LAB — CBC
HEMATOCRIT: 36.7 % — ABNORMAL LOW (ref 39.0–48.0)
HEMOGLOBIN: 12.9 g/dL (ref 12.9–16.5)
MEAN CORPUSCULAR HEMOGLOBIN CONC: 35.2 g/dL (ref 32.0–36.0)
MEAN CORPUSCULAR HEMOGLOBIN: 37.4 pg — ABNORMAL HIGH (ref 25.9–32.4)
MEAN CORPUSCULAR VOLUME: 106.2 fL — ABNORMAL HIGH (ref 77.6–95.7)
MEAN PLATELET VOLUME: 7.9 fL (ref 6.8–10.7)
PLATELET COUNT: 145 10*9/L — ABNORMAL LOW (ref 150–450)
RED BLOOD CELL COUNT: 3.46 10*12/L — ABNORMAL LOW (ref 4.26–5.60)
RED CELL DISTRIBUTION WIDTH: 17.8 % — ABNORMAL HIGH (ref 12.2–15.2)
WBC ADJUSTED: 4.6 10*9/L (ref 3.6–11.2)

## 2020-05-20 LAB — HIGH SENSITIVITY TROPONIN I - SINGLE
HIGH SENSITIVITY TROPONIN I: 10 ng/L (ref <=53)
HIGH SENSITIVITY TROPONIN I: 12 ng/L (ref <=53)

## 2020-05-20 LAB — MAGNESIUM
MAGNESIUM: 1.5 mg/dL — ABNORMAL LOW (ref 1.6–2.6)
MAGNESIUM: 1.6 mg/dL (ref 1.6–2.6)

## 2020-05-20 LAB — PHOSPHORUS: PHOSPHORUS: 3.4 mg/dL (ref 2.4–5.1)

## 2020-05-20 LAB — T4, FREE: FREE T4: 1.67 ng/dL (ref 0.89–1.76)

## 2020-05-20 MED ADMIN — divalproex ER (DEPAKOTE ER) extended release 24 hr tablet 750 mg: 750 mg | ORAL | @ 06:00:00

## 2020-05-20 MED ADMIN — dilTIAZem (CARDIZEM) tablet 30 mg: 30 mg | ORAL | @ 16:00:00 | Stop: 2020-05-20

## 2020-05-20 MED ADMIN — thiamine mononitrate (vit B1) tablet 200 mg: 200 mg | ORAL | @ 20:00:00 | Stop: 2020-05-22

## 2020-05-20 MED ADMIN — OLANZapine (ZyPREXA) tablet 20 mg: 20 mg | ORAL | @ 06:00:00

## 2020-05-20 MED ADMIN — dilTIAZem (CARDIZEM) injection 10 mg: 10 mg | INTRAVENOUS | Stop: 2020-05-20

## 2020-05-20 MED ADMIN — metoprolol tartrate (LOPRESSOR) tablet 12.5 mg: 12.5 mg | ORAL | @ 04:00:00 | Stop: 2020-05-20

## 2020-05-20 MED ADMIN — gabapentin (NEURONTIN) capsule 200 mg: 200 mg | ORAL | @ 12:00:00

## 2020-05-20 MED ADMIN — pantoprazole (PROTONIX) EC tablet 40 mg: 40 mg | ORAL | @ 12:00:00

## 2020-05-20 MED ADMIN — nicotine (NICODERM CQ) 21 mg/24 hr patch 1 patch: 1 | TRANSDERMAL | @ 12:00:00

## 2020-05-20 MED ADMIN — thiamine mononitrate (vit B1) tablet 200 mg: 200 mg | ORAL | @ 11:00:00 | Stop: 2020-05-22

## 2020-05-20 MED ADMIN — metoprolol (LOPRESSOR) injection 5 mg: 5 mg | INTRAVENOUS | @ 12:00:00 | Stop: 2020-05-20

## 2020-05-20 MED ADMIN — sodium chloride tablet 1 g: 1 g | ORAL | @ 15:00:00

## 2020-05-20 MED ADMIN — magnesium oxide (MAG-OX) tablet 800 mg: 800 mg | ORAL | @ 20:00:00 | Stop: 2020-05-20

## 2020-05-20 MED ADMIN — adenosine (ADENOCARD) injection 12 mg: 12 mg | INTRAVENOUS | @ 02:00:00 | Stop: 2020-05-19

## 2020-05-20 MED ADMIN — adenosine (ADENOCARD) injection 6 mg: 6 mg | INTRAVENOUS | @ 02:00:00 | Stop: 2020-05-19

## 2020-05-20 MED ADMIN — thiamine mononitrate (vit B1) tablet 200 mg: 200 mg | ORAL | @ 06:00:00 | Stop: 2020-05-22

## 2020-05-20 MED ADMIN — potassium chloride (KLOR-CON) CR tablet 40 mEq: 40 meq | ORAL | @ 13:00:00 | Stop: 2020-05-20

## 2020-05-20 MED ADMIN — COVID 19, MRNA-1273 (MODERNA) (PF) injection 100mcg/0.5mL: .25 mL | INTRAMUSCULAR | @ 16:00:00 | Stop: 2020-05-20

## 2020-05-20 MED ADMIN — gabapentin (NEURONTIN) capsule 100 mg: 100 mg | ORAL | @ 06:00:00 | Stop: 2020-05-20

## 2020-05-20 MED ADMIN — thiamine mononitrate (vit B1) tablet 100 mg: 100 mg | ORAL | @ 01:00:00

## 2020-05-20 MED ADMIN — LORazepam (ATIVAN) tablet 2 mg: 2 mg | ORAL | @ 08:00:00

## 2020-05-20 MED ADMIN — dilTIAZem (CARDIZEM) injection 10 mg: 10 mg | INTRAVENOUS | @ 13:00:00 | Stop: 2020-05-20

## 2020-05-20 MED ADMIN — folic acid (FOLVITE) tablet 1 mg: 1 mg | ORAL | @ 12:00:00

## 2020-05-20 MED ADMIN — dilTIAZem (CARDIZEM) tablet 30 mg: 30 mg | ORAL | Stop: 2020-05-20

## 2020-05-20 MED ADMIN — multivitamins, therapeutic with minerals tablet 1 tablet: 1 | ORAL | @ 01:00:00 | Stop: 2020-05-19

## 2020-05-20 MED ADMIN — enoxaparin (LOVENOX) syringe 40 mg: 40 mg | SUBCUTANEOUS | @ 11:00:00

## 2020-05-20 MED ADMIN — dilTIAZem (CARDIZEM) injection 20 mg: 20 mg | INTRAVENOUS | @ 03:00:00 | Stop: 2020-05-19

## 2020-05-20 MED ADMIN — metoprolol tartrate (LOPRESSOR) tablet 25 mg: 25 mg | ORAL | @ 11:00:00 | Stop: 2020-05-20

## 2020-05-20 MED ADMIN — lactated ringers bolus 1,000 mL: 1000 mL | INTRAVENOUS | @ 04:00:00

## 2020-05-20 MED ADMIN — dilTIAZem (CARDIZEM) injection 15 mg: 15 mg | INTRAVENOUS | @ 02:00:00 | Stop: 2020-05-19

## 2020-05-20 MED ADMIN — dilTIAZem (CARDIZEM) tablet 30 mg: 30 mg | ORAL | @ 21:00:00 | Stop: 2020-05-20

## 2020-05-20 MED ADMIN — sodium chloride tablet 1 g: 1 g | ORAL | @ 14:00:00

## 2020-05-20 MED ADMIN — gabapentin (NEURONTIN) capsule 200 mg: 200 mg | ORAL | @ 06:00:00

## 2020-05-20 MED ADMIN — melatonin tablet 3 mg: 3 mg | ORAL | @ 21:00:00

## 2020-05-20 MED ADMIN — magnesium sulfate 2gm/50mL IVPB: 2 g | INTRAVENOUS | @ 04:00:00

## 2020-05-20 NOTE — Unmapped (Addendum)
Outpatient Follow-up Needs:  [ ]  Recheck BMP and Mg as an outpatient given persistently low electrolytes  [ ]  Monitor HR  [ ]  Consider restarting home amlodipine / switching antihypertensives if BP still elevated  [ ]  Needs routine outpatient cancer screening (C-scope)  [ ]  Ensure Psychiatry follow-up, scheduled for 6/20  [ ]  Continue to work on alcohol and tobacco cessation    Nicholas Rush is a 55 y.o. male with PMH of HTN, bipolar I disorder, alcohol use disorder c/b withdrawal seizures, recurrent C diff infection who presented to the ED for alcohol withdrawal, found to be in atrial fibrillation with RVR and C.diff positive.    Paroxysmal atrial fibrillation  Patient presented in atrial fibrillation with RVR. Tachycardia initially not responsive to metoprolol. Responded to diltiazem. TSH slightly low but thyrotropin receptor antibodies negative. Echo 5/5 non-concerning. Etiology of atrial fibrillation likely infection and alcohol withdrawal. Patient managed on diltiazem with rate control and returned to normal sinus rhythm.   - Continue diltiazem CD 360 mg daily for AFib  - Stop amlodipine  - Increase magnesium oxide from 800 mg daily to 800 mg three times daily for goal Mg >2     Alcohol withdrawal, resolved  Alcohol use disorder  Patient reports drinking 3-4 bourbon shots per day, last drink at 6pm 05/19/20. He has withdrawn multiple times with complications including seizure with last withdrawal. No significant withdrawal symptoms this admission. CIWA scores ranged from 4-5. Transitioned off CIWA on hospital day 4 and managed with multivitamin, thiamine, gabapentin, melatonin at bedtime.  - Stop folate, as recent level >24 ng/mL  - Start thiamine 100 mg daily for Wernicke's encephalopathy prevention  - Increase gabapentin from 100 mg AM/200 mg PM to 200 mg twice daily for neuropathy and prolonged withdrawal symptoms     Recurrent C. difficile infection, resolved  Found to be positive for C. Diff. Patient with with improvement of diarrhea upon initiation of fidaxomicin. S/p fidaxomicin course 5/3-5/12 with resolution of diarrhea. Subsequently had constipation, managed with miralax and senna.   - Stop omeprazole in setting of recurrent C difficile colitis and persistent hypomagnesemia  - Stop Tums due to post-C diff constipation    Hypomagnesemia, resolved  Hypokalemia, resolved  Hyponatremia, resolved  Had persistently low Mg and K+ while admitted as well as intermittent mild hyponatremia (nadir 132), responsive to Mg/K repletion and PO intake.  - Mg supplementation as above     ? Bipolar I disorder  Managed with home Depakote 750 mg nightly and Zyprexa 20 mg nightly without changes. Per OP Psychiatrist, may not have BPD1 as this is a more recent diagnosis which is an atypical presentation. Additionally, in a prior hospitalization, was seen by Psychiatry consult who felt his symptoms were related to a medical condition rather than a primary mood disorder. Psychiatry follow-up already scheduled.     HTN  Home amlodipine was initially held due to soft BPs, not restarted due to diltiazem initiation. After heart rate stabilized, patient remained normotensive to mildly hypertensive (SBP 130-153).      Tobacco use disorder  Patient reported smoking 1PPD. Managed with nicotine replacement therapy with patch and PRN gum and lozenges were made available.  - Start nicotine 4 mg gum/lozenge as needed    Environmental allergies  Maintained on home Zyrtec.

## 2020-05-20 NOTE — Unmapped (Signed)
Social Work  Psychosocial Assessment    Patient Name: Nicholas Rush   Medical Record Number: [id number]   Date of Birth: 12/11/1965  Sex: Male     From H and P:  Nicholas Rush is a 55 y.o. male with a past medical history significant for HTN, bipolar I disorder, alcohol use disorder c/b withdrawal seizures, recurrent c-diff who presents to the ED for alcohol withdrawal, and found to be in atrial fibrillation with RVR.    SW interviewed the patient's wife [name]) by telephone.    The patient lives with his wife and the wife's two children from a previous marriage -- a son (44 years old) and a daughter (50 years old).  The patient's wife and the wife's daughter work at a preschool.  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  The patient's wife is assisting him with applying for SSI.    Per wife, the patient's condition has declined dramatically over the past 2 years.  The patient has always been a drinker, but this worsened during COVID when the patient found himself out of work.  The patient's wife stated he drinks a large amount of liquor each day but typically mixes this into [facility].  She has tried to discourage his drinking and has, at times, refused to buy it for him; however, he is very demanding in wanting alcohol.  The patient's wife says she finds it is easier to give into his demands for alcohol. The patient has been to several detox facilities and completed treatment at [facility] in [place].  He has always returned to alcohol, however.    The wife stated the patient hasn't walked in over a year.  He has not taken a shower in two weeks and has recently been pooping all over the house.  The patient has declined to go to PT/OT appointments and has been unsuccessful in getting home health services.  The patient rarely uses his medical equipment.  The patient's wife stated she believes the patient's drinking would resolve itself if the patient were able to walk again.      The patient has a history of Bipolar Disorder and is treated at the [facility] by Dr. Roxanne Gates.      SW will continue to follow.       Referral  Referred by: Care Manager  Reason for Referral: Substance Abuse Issues    Extended Emergency Contact Information  Primary Emergency Contact: Bisbee,[name]  Address: [address] [place], [place] [address] [place] [employer] Phone: [phone]  Relation: Spouse  Preferred language: ENGLISH  Interpreter needed? No    Legal Next of Kin / Guardian / POA / Advance Directives    HCDM (patient stated preference): Bisbee,[name] - Spouse - [phone]    Advance Directive (Medical Treatment)  Does patient have an advance directive covering medical treatment?: Patient has advance directive covering medical treatment, copy not in chart.  Advance directive covering medical treatment not in Chart:: Copy requested from family    Health Care Decision Maker [HCDM] (Medical & Mental Health Treatment)  Healthcare Decision Maker: HCDM documented in the HCDM/Contact Info section.  Information offered on HCDM, Medical & Mental Health advance directives:: Patient declined information.    Advance Directive (Mental Health Treatment)  Does patient have an advance directive covering mental  health treatment?: Patient has advance directive covering mental health treatment, copy not in chart.  Advance directive covering mental health treatment not in Chart:: Copy requested from family.  Reason patient does not have an advance directive covering mental health treatment:: HCDM documented in the HCDM/Contact Info section.    Discharge Planning  See CM Initial Transition Planning Assesssment for Discharge Planning Information.    Social Determinants of Health  Social Determinants of Health were addressed in provider documentation.  Please refer to patient history.    Social History  Support Systems/Concerns: Family Members, Economist Service: No Conservator, museum/gallery and Psychiatric History  Psychosocial Stressors: Coping with health challenges/recent hospitalization      Psychological Issues/Information: Mental illness   Concerns: Active mental illness concerns          Chemical Dependency: Alcohol              Outpatient Providers: Primary Care Provider             Ability to Access Community Services: Unfamiliar with options/procedures for obtaining

## 2020-05-20 NOTE — Unmapped (Signed)
OCCUPATIONAL THERAPY  Evaluation (05/20/20 1046)    Patient Name:  Nicholas Rush       Medical Record Number: [id number]   Date of Birth: 12-07-1965  Sex: Male          OT Treatment Diagnosis:  eval for eval and treat    Assessment    Problem List: Fall Risk, Impaired ADLs, Decreased cognition, Decreased strength, Decreased mobility, Impaired balance, Decreased endurance    Assessment: Nicholas Rush is a 55 y.o. male with a past medical history significant for HTN, bipolar I disorder, alcohol use disorder c/b withdrawal seizures, recurrent c-diff who presents to the ED for alcohol withdrawal, and found to be in atrial fibrillation with RVR. Pt reports prior to admission, he has been requiring min-mod A for ADLs and ADL transfers including LE dressing, LE bathing, and has been utilizing rolling stool for functional mobility for household distances. Pt is currently requiring mod-max A for ADLs and ADL transfers using RW due to demonstrated deficits including decreased balance, decreased strength, decreased safety awareness, and decreased coordination decreasing independence. Skilled OT recommended to address these deficits and increase independence. Recommend 5x low at DC due to significant decrease in function from prior admissions and decrease in coordination. AM-PAC 15/24 indicating 56.4% impairment.    Today's Interventions: ADL retraining, Balance activities, Conservation, Education - Patient, Safety education, Functional mobility, Endurance activities, Education - Family / caregiver, Teacher, early years/pre    Activity Tolerance During [employer] Session  Tolerated treatment well    Plan  Planned Frequency of Treatment:  1-2x per day for: 3-4x week       Planned Interventions:  ADL retraining, Balance activities, Compensatory tech. training, Conservation, Education - Patient, Education - Family / caregiver, Merchant navy officer, Endurance activities, Functional mobility, Safety education, Therapeutic exercise, UE Strength / coordination exercise, Transfer training    Post-Discharge Occupational Therapy Recommendations:   5x weekly, Low intensity   OT DME Recommendations: Defer to post acute -        GOALS:   Patient and Family Goals: get stronger    Long Term Goal #1: Pt will maximize functional independence in self care routines in 4 weeks       Short Term:  Pt will complete toilet transfer with min A using LRD and no balance concerns noted   Time Frame : 2 weeks  Pt will complete LE dressing with min A using AE as needed and LRD   Time Frame : 2 weeks  Pt will complete grooming task sitting at sink with mod I   Time Frame : 2 weeks  Pt will complete full body bathing with min A using LRD   Time Frame : 2 weeks           Prognosis:  Good  Positive Indicators:  motivated for therapy  Barriers to Discharge: Inability to safely perform ADLS, Endurance deficits, Impaired Balance, Decreased safety awareness    Subjective  Current Status Pt found and left supine in bed with all needs met and call bell within reach  Prior Functional Status Pt reported min/mod A for ADLs and mod A using RW. Pt requires assist for showering and LE dressing and pt has been unable to leave the house because he has felt unsafe walking up/down ramp. Pt typically completed eating and grooming tasks in seated position    Medical Tests / Procedures: reviewed in EPIC  Services patient receives: OT, PT    Patient / Caregiver reports: I would  like to go home Pt agreeable to OT session    Past Medical History:   Diagnosis Date   ??? Anxiety    ??? Bipolar 1 disorder (CMS-HCC)    ??? Depressive disorder    ??? Erectile dysfunction    ??? Esophageal reflux    ??? History of pyloric stenosis    ??? Hypertension    ??? Insomnia    ??? Psoriasis     Dermatologist - Dr. Deloria Lair in [facility]   ??? Seizure (CMS-HCC) 02/20/2019   ??? Tobacco use     1ppd    Social History     Tobacco Use   ??? Smoking status: Current Every Day Smoker     Packs/day: 1.00     Years: 30.00     Pack years: 30.00 Types: Cigarettes   ??? Smokeless tobacco: Never Used   Substance Use Topics   ??? Alcohol use: Yes     Comment: 3 drinks a day.      Past Surgical History:   Procedure Laterality Date   ??? PYLOROMYOTOMY     ??? WISDOM TOOTH EXTRACTION      Family History   Problem Relation Age of Onset   ??? Bipolar disorder Mother    ??? Depression Sister    ??? Alcohol abuse Sister    ??? Alcohol abuse Maternal Aunt    ??? Melanoma Neg Hx    ??? Basal cell carcinoma Neg Hx    ??? Squamous cell carcinoma Neg Hx         Tramadol and Hydroxyzine     Objective Findings  Precautions / Restrictions  Seizure precautions, Falls precautions    Weight Bearing  Non-applicable    Required Braces or Orthoses  Non-applicable    Communication Preference  Verbal    Pain  Pt reported no pain    Equipment / Environment  Vascular access (PIV, TLC, Port-a-cath, PICC), Patient not wearing mask for full session, Caregiver wearing mask for full session    Living Situation  Living Environment: Mobile home  Lives With: Spouse  Home Living: One level home, Ramped entrance, Tub/shower unit, Hand-held shower hose, Standard height toilet, Raised toilet seat with rails, Shower chair without back  Equipment available at home: [employer], Rollator (pt reports he's been using a rolling stool for mobility prior to admission)     Cognition   Orientation Level:  Oriented x 4   Arousal/Alertness:  Appropriate responses to stimuli   Attention Span:  Attends with cues to redirect   Memory:  Appears intact   Following Commands:  Follows one step commands without difficulty   Safety Judgment:  Decreased awareness of need for assistance, Decreased awareness of need for safety   Awareness of Errors:  Unable to assess   Problem Solving:  Unable to assess   Comments:      Vision / Hearing   Vision: Wears glasses for reading only     Hearing: No deficit identified         Hand Function:  Right Hand Function: Right hand grip strength, ROM and coordination WNL  Left Hand Function: Left hand function impaired  Left Hand Impairment: grip strength fair  Hand Dominance: Right    Skin Inspection:  Skin Inspection: Intact where visualized    ROM / Strength:  UE ROM/Strength: Left Impaired/Limited, Right WFL  LUE Impairment: Reduced strength, Limited AROM  LE ROM/Strength: Left WFL, Right WFL    Coordination:  Coordination: Decreased accuracy  Sensation:  RUE Sensation: RUE intact  LUE Sensation: LUE impaired  LUE Sensation Impairment: Numbness, Tingling    Balance:  static sitting - supervision. static standing - max A. dynamic standing - max A    Functional Mobility  Transfer Assistance Needed: Yes  Transfers - Needs Assistance: Max assist (stand pivot transfer from EOB to [facility] using RW)  Bed Mobility Assistance Needed: Yes  Bed Mobility - Needs Assistance: Standby assist  Ambulation: pt completed 3 steps with max A using RW and limited elevation of B feet noted      ADLs  ADLs: Needs assistance with ADLs  ADLs - Needs Assistance: LB dressing, Toileting, Bathing, Grooming, UB dressing  Grooming - Needs Assistance: Performed seated, Set Up Assist, Verbal assist/cues required (oral care sitting EOB, verbal cues for posture and safety)  Bathing - Needs Assistance: Mod assist (anticipate mod A for peri care and LE bathing)  Toileting - Needs Assistance: Max assist (clothing management and peri care on BSC)  UB Dressing - Needs Assistance: Min assist (for threading LUE)  LB Dressing - Needs Assistance: Min assist (anticpate min A for pulling over hips. pt doffed and donned B socks sitting EOB with supervision)  IADLs: NT      Vitals / Orthostatics  At Rest: HT 120 BPM at rest. O2 sat 97% on room air. BP 97/56.  With Activity: Following functional mobility, HR 146 BPM, BP 115/85.  Orthostatics: NT      Medical Staff Made Aware: RN aware      Occupational Therapy Session Duration  OT Individual [mins]: 25         I attest that I have reviewed the above information.  Signed: Girard Cooter, OT  Filed 05/20/2020

## 2020-05-20 NOTE — Unmapped (Signed)
[facility]  Emergency Department Provider Note    ED Clinical Impression     Final diagnoses:   Alcohol use (Primary)   Atrial fibrillation, unspecified type (CMS-HCC)       Initial Impression, ED Course, Assessment and Plan     Impression:     Nicholas Rush is a 55 y.o. male with history of frequent hospitalizations over the past months due to mania from likely bipolar 1 disorder, TBI, and alcohol use disorder who presents to the ED for detox.     On exam, the patient is alert and oriented. NAD. Vitals notable for tachycardia to 105 and hypertension to 136/84. PE is unremarkable.     Plan for EKG, ethanol, COVID PCR, Utox, hsTroponin, lipase, UA, C.diff assay, and basic labs.         10:12 PM   Patient is tachycardic to 192 appears to be in SVT. Patient denies hx of arrhythmia. Denies chest pain, shortness of breath, or palpitations. Patient was given adenosine, with slowed rate an additional EKG was captured demonstrating Afib for which patient was given diltiazem.     2300:   Patient signed out pending admission. Labs so far notable for Na 133, etoh 183. AST 141, ALT 56.     Additional Medical Decision Making     I have reviewed the vital signs and the nursing notes. Labs and radiology results that were available during my care of the patient were independently reviewed by me and considered in my medical decision making.     I independently visualized the EKG tracing.   I reviewed the patient's prior medical records (ED 03/18/20).   I discussed the case with the admitting provider.     Portions of this record have been created using Scientist, clinical (histocompatibility and immunogenetics). Dictation errors have been sought, but may not have been identified and corrected.  ____________________________________________       History     Chief Complaint  Detox Evaluation      HPI   Nicholas Rush is a 55 y.o. male with PMH of frequent hospitalizations over the past months due to mania from likely bipolar 1 disorder, TBI, and alcohol use disorder who presents to the ED for a detox evaluation. The patient last had a drink around 6PM today and wants to detox, but he has a hx of withdrawal seizures, which prompted him to come into the ED. He has 3-4 whiskey drinks every day. Hist last seizure was 3 months ago, which is also the last time he tried to detox. Denies hx of delirium or tremors with withdrawal. He states that his PCP advised him to present to the ED for lab workup as his sodium levels have dropped, and he is still having diarrhea concerning for ongoing C. Diff. The patient smokes cigarettes and will occasionally use marijuana. Denies abdominal pain.     Past Medical History:   Diagnosis Date   ??? Anxiety    ??? Bipolar 1 disorder (CMS-HCC)    ??? Depressive disorder    ??? Erectile dysfunction    ??? Esophageal reflux    ??? History of pyloric stenosis    ??? Hypertension    ??? Insomnia    ??? Psoriasis     Dermatologist - Dr. Deloria Lair in [facility]   ??? Seizure (CMS-HCC) 02/20/2019   ??? Tobacco use     1ppd       Patient Active Problem List   Diagnosis   ???  Bipolar disorder (CMS-HCC)   ??? Alcohol withdrawal syndrome, with delirium (CMS-HCC)   ??? Alcohol use disorder, moderate, dependence (CMS-HCC)   ??? Anxiety state   ??? Essential hypertension   ??? Psoriasis   ??? Psoriatic arthritis (CMS-HCC)   ??? Positive colorectal cancer screening using DNA-based stool test   ??? Alcohol withdrawal seizure (CMS-HCC)   ??? Recurrent falls   ??? Thrombocytopenia (CMS-HCC)   ??? Macrocytic anemia   ??? Hyponatremia   ??? Hypomagnesemia   ??? Tobacco use disorder   ??? Encephalopathy   ??? Chronic midline low back pain   ??? Paroxysmal atrial fibrillation (CMS-HCC)       Past Surgical History:   Procedure Laterality Date   ??? PYLOROMYOTOMY     ??? WISDOM TOOTH EXTRACTION           Current Facility-Administered Medications:   ???  cetirizine (ZyrTEC) tablet 10 mg, 10 mg, Oral, Daily, Avelina Laine, MD, 10 mg at 05/27/20 1610  ???  dilTIAZem (CARDIZEM CD) 24 hr capsule 360 mg, 360 mg, Oral, Q24H, Amy P Scott, MD, 360 mg at 05/27/20 1210  ???  divalproex ER (DEPAKOTE ER) extended release 24 hr tablet 750 mg, 750 mg, Oral, Nightly, Amy Kirby Crigler, MD, 750 mg at 05/26/20 2016  ???  enoxaparin (LOVENOX) syringe 40 mg, 40 mg, Subcutaneous, Q24H, Amy P Scott, MD, 40 mg at 05/27/20 0932  ???  fidaxomicin (DIFICID) tablet 200 mg, 200 mg, Oral, Q12H SCH, Amy P Scott, MD, 200 mg at 05/27/20 9604  ???  gabapentin (NEURONTIN) capsule 200 mg, 200 mg, Oral, BID, Amy P Scott, MD, 200 mg at 05/27/20 5409  ???  magnesium oxide (MAG-OX) tablet 800 mg, 800 mg, Oral, BID, Avelina Laine, MD, 800 mg at 05/27/20 1548  ???  melatonin tablet 3 mg, 3 mg, Oral, QPM, Amy P Scott, MD, 3 mg at 05/26/20 2020  ???  multivitamins, therapeutic with minerals tablet 1 tablet, 1 tablet, Oral, Daily, Amy Kirby Crigler, MD, 1 tablet at 05/27/20 8119  ???  naltrexone (DEPADE) tablet 50 mg, 50 mg, Oral, Daily, Benna Dunks, MD, 50 mg at 05/27/20 1478  ???  nicotine (NICODERM CQ) 21 mg/24 hr patch 1 patch, 1 patch, Transdermal, Daily, Amy Kirby Crigler, MD, 1 patch at 05/27/20 0937  ???  nicotine polacrilex (NICORETTE) gum 4 mg, 4 mg, Buccal, Q1H PRN, Amy P Scott, MD  ???  nicotine polacrilex (NICORETTE) lozenge 4 mg, 4 mg, Buccal, Q1H PRN, Amy P Scott, MD  ???  OLANZapine (ZyPREXA) tablet 20 mg, 20 mg, Oral, Nightly, Amy P Scott, MD, 20 mg at 05/26/20 2016  ???  ondansetron (ZOFRAN-ODT) disintegrating tablet 4 mg, 4 mg, Oral, Q12H PRN, Amy P Scott, MD  ???  pantoprazole (PROTONIX) EC tablet 40 mg, 40 mg, Oral, Daily, Amy Kirby Crigler, MD, 40 mg at 05/27/20 0928  ???  pneumococcal conjugate (13-valent) (PREVNAR-13) vaccine 0.5 mL, 0.5 mL, Intramuscular, During hospitalization, Amy Kirby Crigler, MD  ???  polyethylene glycol (MIRALAX) packet 17 g, 17 g, Oral, Nightly, Amy P Scott, MD, 17 g at 05/26/20 2016  ???  senna (SENOKOT) tablet 2 tablet, 2 tablet, Oral, Nightly, Benna Dunks, MD, 2 tablet at 05/26/20 2017  ???  sodium chloride tablet 1 g, 1 g, Oral, TID, Amy P Scott, MD, 1 g at 05/27/20 1650  ???  [COMPLETED] thiamine mononitrate (vit B1) tablet 200 mg, 200 mg, Oral, Q8H SCH, 200 mg at 05/22/20 0629 **FOLLOWED BY** thiamine mononitrate (vit  B1) tablet 200 mg, 200 mg, Oral, Daily, Amy Kirby Crigler, MD, 200 mg at 05/27/20 1610  ???  varicella-zoster gE-AS01B (PF) (SHINGRIX) injection, 0.5 mL, Intramuscular, During hospitalization, Amy Kirby Crigler, MD    Allergies  Tramadol and Hydroxyzine    Family History   Problem Relation Age of Onset   ??? Bipolar disorder Mother    ??? Depression Sister    ??? Alcohol abuse Sister    ??? Alcohol abuse Maternal Aunt    ??? Melanoma Neg Hx    ??? Basal cell carcinoma Neg Hx    ??? Squamous cell carcinoma Neg Hx        Social History  Social History     Tobacco Use   ??? Smoking status: Current Every Day Smoker     Packs/day: 1.00     Years: 30.00     Pack years: 30.00     Types: Cigarettes   ??? Smokeless tobacco: Never Used   Vaping Use   ??? Vaping Use: Never used   Substance Use Topics   ??? Alcohol use: Yes     Comment: 3 drinks a day.   ??? Drug use: Yes     Types: Marijuana       Review of Systems   Constitutional: Negative for fever.  Eyes: Negative for visual changes.  ENT: Negative for sore throat.  Cardiovascular: Negative for chest pain.  Respiratory: Negative for shortness of breath.  Gastrointestinal: Negative for abdominal pain, vomiting or diarrhea.  Genitourinary: Negative for dysuria.   Musculoskeletal: Negative for back pain.  Skin: Negative for rash.  Neurological: Negative for headaches, focal weakness or numbness.    Physical Exam     ED Triage Vitals [05/19/20 2029]   Enc Vitals Group      BP 138/90      Heart Rate 108      SpO2 Pulse 108      Resp 16      Temp 36.6 ??C (97.9 ??F)      Temp Source Oral      SpO2 100 %      Weight 89.4 kg (197 lb)      Height 1.829 m (6')     Constitutional: Alert and oriented. Well appearing and in no distress.  Eyes: Conjunctivae are normal.  ENT       Head: Normocephalic and atraumatic.       Nose: No congestion.       Mouth/Throat: Mucous membranes are moist.       Neck: No stridor.  Hematological/Lymphatic/Immunilogical: No cervical lymphadenopathy.  Cardiovascular: Normal rate, regular rhythm. Normal and symmetric distal pulses are present in all extremities.  Respiratory: Normal respiratory effort. Breath sounds are normal.  Gastrointestinal: Soft and nontender. There is no CVA tenderness.  Musculoskeletal: Normal range of motion in all extremities.       Right lower leg: No tenderness or edema.       Left lower leg: No tenderness or edema.  Neurologic: Normal speech and language. No gross focal neurologic deficits are appreciated.  Skin: Skin is warm, dry and intact. No rash noted.  Psychiatric: Mood and affect are normal. Speech and behavior are normal.      EKG     Encounter Date: 05/19/20   ECG 12 lead (Adult)   Result Value    EKG Systolic BP     EKG Diastolic BP     EKG Ventricular Rate 150    EKG Atrial Rate  EKG P-R Interval     EKG QRS Duration 78    EKG Q-T Interval 286    EKG QTC Calculation 451    EKG Calculated P Axis     EKG Calculated R Axis 51    EKG Calculated T Axis 8    QTC Fredericia 388    Narrative    ATRIAL FIBRILLATION WITH RAPID VENTRICULAR RESPONSE  NONSPECIFIC ST ABNORMALITY  ABNORMAL ECG  WHEN COMPARED WITH ECG OF 19-May-2020 22:14,  NO SIGNIFICANT CHANGE WAS FOUND  Confirmed by Freeman Caldron ([id number]) on 05/20/2020 8:49:23 AM       ______________________________________________________   Documentation assistance was provided by Tommas Olp, Scribe, on May 19, 2020 at 9:33 PM for Bea Laura, MD.        May 27, 2020 8:13 PM. Documentation assistance provided by the above mentioned scribe. I was present during the time the encounter was recorded. The information recorded by the scribe was done at my direction and has been reviewed and validated by me.   Bea Laura, MD          Bea Laura, MD  05/27/20 2013

## 2020-05-20 NOTE — Unmapped (Signed)
ED Progress Note    I received signout on patient care from outgoing physician.    In brief, this is a 55 year old male with history of complicated alcohol withdrawal with DTs and seizures who presents for alcohol withdrawal with a strengthening 6 PM.  He initially had a heart rate in the 100s, however during his ED stay, developed tachycardia to the 160s to 170s.  Previous physician had given him adenosine to slow down the heart rate and better delineate the underlying rhythm and appeared to be atrial fibrillation with RVR.  Patient was given diltiazem and his heart rate is now in the 1 teens to 120s.  He has mentating well.  Alcohol level 183.  Patient was paged out for admission.    I discussed patient's case with family medicine admitting consultant and they will evaluate patient for admission.

## 2020-05-20 NOTE — Unmapped (Signed)
ULTRASOUND PIV PROCEDURE NOTE    Indications:   Poor venous access.    Ultrasound guidance was necessary to obtain access.     Procedure Details:  Identity of the patient was confirmed via name, medical record number and date of birth. The availability of the correct equipment was verified.    The vein was identified and measured for ultrasound catheter insertion.       Vein measurement (without tourniquet):   0.32 cm   A(n) 20 g x 1.75 inch catheter was selected based on the recommendations below:    [facility] Catheter/Vein Ratio Guidelines    Chart to determine PIV catheter size/length to use based on vein diameter and depth   Catheter Gauge Size (g)  22g 20g 18g   Catheter length (inches)  1.75 1.75 1.75   Catheter diameter measurement (mm) 0.44mm 0.57mm 1.2mm          Minimum required vein diameter       Sonosite (cm)  0.25cm 0.3cm 0.4cm          Maximum vein depth  1.25 cm 1.25 cm 1.25 cm            The field was prepared with necessary supplies and equipment.  Probe cover and sterile gel utilized. Insertion site was prepped with chlorhexidine and allowed to dry.  The catheter extension was primed with normal saline. The [place] PIV was placed in the left forearm with 1 attempt(s).     Catheter aspirated, 3 mL blood return present. The catheter was then flushed with 10 mL of normal saline. Insertion site cleansed, and dressing applied per manufacturer guidelines. The catheter was inserted without difficulty.    Thank you,     Alyssa Grove DNP, RN    Ultrasound Resource Nurse    Workup / Procedure Time:  15 minutes

## 2020-05-20 NOTE — Unmapped (Signed)
ULTRASOUND PIV PROCEDURE NOTE    Indications:   Poor venous access.    Ultrasound guidance was necessary to obtain access.     Procedure Details:  Identity of the patient was confirmed via name, medical record number and date of birth. The availability of the correct equipment was verified.    The vein was identified and measured for ultrasound catheter insertion.       Vein measurement (without tourniquet):   0.26 cm   A(n) 22 g x 1.75 inch catheter was selected based on the recommendations below:    [facility] Catheter/Vein Ratio Guidelines    Chart to determine PIV catheter size/length to use based on vein diameter and depth   Catheter Gauge Size (g)  22g 20g 18g   Catheter length (inches)  1.75 1.75 1.75   Catheter diameter measurement (mm) 0.25mm 0.72mm 1.76mm          Minimum required vein diameter       Sonosite (cm)  0.25cm 0.3cm 0.4cm          Maximum vein depth  1.25 cm 1.25 cm 1.25 cm            The field was prepared with necessary supplies and equipment.  Probe cover and sterile gel utilized. Insertion site was prepped with chlorhexidine and allowed to dry.  The catheter extension was primed with normal saline. The [place] PIV was placed in the right forearm with 1 attempt(s).     Catheter aspirated, 3 mL blood return present. The catheter was then flushed with 10 mL of normal saline. Insertion site cleansed, and dressing applied per manufacturer guidelines. The catheter was inserted without difficulty.    Thank you,     Alyssa Grove DNP, RN    Ultrasound Resource Nurse    Workup / Procedure Time:  15 minutes

## 2020-05-20 NOTE — Unmapped (Signed)
Family Medicine Inpatient Service    Progress Note    Team: [facility] (pgr [phone])    Hospital Day: 1    ASSESSMENT / PLAN:   Nicholas Rush is a 55 y.o. male with a past medical history significant for HTN, bipolar I disorder, alcohol use disorder c/b withdrawal seizures, recurrent c-diff who presented to the ED for alcohol withdrawal, and found to be in atrial fibrillation with RVR.    # Atrial Fibrillation:   Patient currently tachycardic in A. Fib with irregular rhythm. Rate not responsive to metoprolol. Required 10mg  IV diltiazem for rate control.    Etiology for afib at this time includes alcohol withdrawal, subclinical hyperthyroidism (less likely given absence of typical exam findings), possible c-diff infection, cardiac issue.  - continue diltiazem 30mg  q6h (6pm dose given early this evening (5/2) due to HR returning to 130s-140s around 5pm)  - thyrotropin receptor antibodies pending for Grave's Disease workup  - C. Diff pending  - K 3.7, repleted. Mg 1.5, repleted (goal Mg > 2, K > 4)  - consider echo once rate decreases  - consider repeat lipids, A1C for cardiac risk factor workup (both last performed in 02/2020)  ??  # Alcohol Withdrawal:   Patient reports drinking 3-4 bourbon shots per day, last drink at 6pm 05/19/20. He has withdrawn multiple times with complications including seizure with last withdrawal. Withdrawal symptoms have been very mild thus far, with highest CIWA of 8 this morning, resulting in 2mg  ativan being given.  - continue CIWAS w/ symptpom triggered Ativan. If patient begins consistently scoring higher, will consider scheduling ativan.  - thiamine and folate daily  - zofran PRN  - melatonin at bedtime  - baclofen PRN  - continue gabapentin 200mg  BID  ??  # Diarrhea:   Patient currently not having diarrhea  Differential including c. Diff vs Viral gastroenteritis (more likely given family with similar symptoms) vs malabsorption 2/2 alcohol use.   - C-diff assay pending. Currently on enteric precautions. If positive, will plan to treat with PO vancomycin.  - can give imodium PRN if c-diff ruled out  ??  # Bipolar I Disorder: well-controlled on Depakote and Zyprexa  - continue depakote 750mg  at bedtime and zyprexa 20mg  at bedtime. No indication for depakote level at this time.   ??  #HTN:   Patient hypotensive initially this AM, asymptomatic, BP became normotensive in response to PO intake.  - Will hold amlodipine given soft pressures  ??  # Tobacco use disorder:   Patient currently smoking 1PPD.   - Nicotine replacement therapy with patch  ??  # Checklist:  - IVF: none  - Tubes/Lines/Drains: peripheral IV  - Diet Regular  - Bowel Regimen: No indication for a bowel regimen at this time  - DVT: SQ Lovenox  - Code Status:   Orders Placed This Encounter   Procedures   ??? Full Code     Standing Status:   Standing     Number of Occurrences:   1     - Dispo: Step down    [ ]  Anticipated Discharge Location: home or possible placement in SNF- need further discussion with patient and family  [ ]  PT/OT/DME: PT/OT both 5xL  [ ]  CM/SW needs: Substance abuse resources, potentially SNF arrangement  [ ]  Meds/Rx:  Not yet prescribed. No special med needs  [ ]  Teaching: None anticipated  [ ]  Follow up appt: Appointment needed  [ ]  Excuse letter: None anticipated  [ ]   Transport: Private Needed    SUBJECTIVE:  Interval events: admitted     This AM patient HR elevated to 140s and irregular. Given 25mg  PO then 5mg  IV metoprolol without improvement. Given 10mg  IV diltiazem with improvement. Patient not experiencing any additional symptoms. States that he is feeling okay. Denies chest pain, shortness of breath, nausea/vomiting, diarrhea, headaches, dizziness, tremors. Urinating normally. Had 2 BMs this afternoon per nursing. This afternoon, patient received 30mg  PO diltiazem around 1200; his HR remained predominantly in the 100s-110s until around 1700 elevated to 140s sustained and 1800 dose of PO diltiazem was given early.    REVIEW OF SYSTEMS:  Pertinent positives and negatives per HPI. A complete review of systems otherwise negative.    PHYSICAL EXAM:      Intake/Output Summary (Last 24 hours) at 05/20/2020 2045  Last data filed at 05/20/2020 1949  Gross per 24 hour   Intake 858.33 ml   Output 775 ml   Net 83.33 ml       Recent Vitals:  Vitals:    05/20/20 1956   BP: 99/79   Pulse:    Resp:    Temp:    SpO2:      GEN: tired appearing, lying in bed, NAD  HEENT: NCAT, No scleral icterus. Conjunctiva non-erythematous. MMM.  CV: Tachycardic w/ irregular rhythm. No murmurs/rubs/gallops.  Pulm: Normal work of breathing on room air. Diffuse ronchi bilaterally. No wheezing, crackles, or rhonchi.  Abd: Flat.  Nontender. No guarding, rebound.  Normoactive bowel sounds.    Neuro: A&O x 3. No focal deficits.  Ext: Trace pitting peripheral edema on right, 1+ pitting edema along shins on left. Palpable distal pulses.  Skin: No rashes or skin lesions.     LABS/ STUDIES:    All imaging, laboratory studies, and other pertinent tests including electrocardiography within the last 24 hours were reviewed and are summarized within the assessment and plan.     NUTRITION:                           [name], MS4   May 20, 2020 6:13 AM    I attest that I have reviewed the student note and that the components of the history of the present illness, the physical exam, and the assessment and plan documented were performed by me or were performed in my presence by the student where I verified the documentation and performed (or re-performed) the exam and medical decision making.     Kessa Fairbairn Kirby Crigler, MD

## 2020-05-20 NOTE — Unmapped (Signed)
PHYSICAL THERAPY  Evaluation (05/20/20 1250)      Patient Name:?? Nicholas Rush????????   Medical Record Number: [id number]   Date of Birth: 02-26-1965  Sex: Male??????????????      Treatment Diagnosis: PT order for deconditioning     Activity Tolerance: Limited by fatigue     ASSESSMENT  Problem List: Decreased strength, Impaired balance, Decreased mobility, Fall Risk, Impaired ADLs, Gait deviation, Postural Weakness, Decreased coordination      Assessment : Nicholas Rush is a 55 y.o. male with a past medical history significant for HTN, bipolar I disorder, alcohol use disorder c/b withdrawal seizures, recurrent c-diff who presents to the ED for alcohol withdrawal, and found to be in atrial fibrillation with RVR.    Pt presents to skilled PT services with deficits in coordination, balance, functional mobility, and functional strength, requiring RW device with mod-max assist for mobility. OOB mobility limited by significant shakiness throughout body and pt report of fatigue. Therapist reviewed techniques for safe use of RW and safety with transfers, and guided pt through supine exercises to work on strengthening and coordination between PT sessions. Pt would benefit from continued skilled PT services to address safety, education and deficits. After review of contributing co-morbidities and personal factors, clinical presentation and exam findings, patient demonstrates high complexity for evaluation and development of POC.  AMPAC 12/20.      Recommend 5x/wk low intensity post-acute stay.        Today's Interventions: PT eval, gait and balance training, transfers and bed mobility training, therex with HEP training for safe bed-level exercises: AP, QS, GS, HS, SLR.  Pt educated re: PT role and POC, safe use of RW, safety with transfers, rationale for bed-level exercise HEP between PT visits.                            PLAN  Planned Frequency of Treatment:?? 1-2x per day for: 3-4x week       Planned Interventions: Balance activities, Education - Patient, Education - Family / caregiver, Home exercise program, Gait training, Functional mobility, Endurance activities, Neuromuscular re-education, Postural re-education, Self-care / Home training, Therapeutic exercise, Therapeutic activity, Transfer training     Post-Discharge Physical Therapy Recommendations:?? 5x weekly, Low intensity     PT DME Recommendations: Defer to post acute??????????       Goals:   Patient and Family Goals: Get back to walking again.              SHORT GOAL #1: Pt will transfer sit<>stand with LRAD and min-A or less assistance.  ?????????????????????? Time Frame : 2 weeks  SHORT GOAL #2: Pt will ambulate x50' with LRAD and min-A or less assistance.  ?????????????????????? Time Frame : 2 weeks     ??????????????????????       ??????????????????????       ??????????????????????       Prognosis:?? Fair  Positive Indicators: ramped entrance at home with some equipment  Barriers to Discharge: Inability to safely perform ADLS, Endurance deficits, Impaired Balance, Decreased safety awareness, Decreased caregiver support, Functional strength deficits, Gait instability, Severity of deficits     SUBJECTIVE  Equipment / Environment: Vascular access (PIV, TLC, Port-a-cath, PICC), Patient not wearing mask for full session, Caregiver wearing mask for full session  Patient reports: Pt agreeable to PT, repots he is shakey and weak.  Current Functional Status: Pt received seated on BSC with RN and NA present, concluded session  supine in bed with all needs in reach, bed alarm active, RN aware.  Services patient receives: OT, PT  Prior Functional Status: Pt reports that he usually scoots around home on his computer chair and sometimes uses a RW, but his rollator gets away from him.  He had difficulty with outpatient PT due to difficulties getting to the car safely for transport.  Pt sits to shower and brush his teeth, is home alone during the day on weekdays and has not been making it to the bathroom lately.  Equipment available at home: Levan Hurst, Occupational hygienist      Past Medical History:   Diagnosis Date   ??? Anxiety    ??? Bipolar 1 disorder (CMS-HCC)    ??? Depressive disorder    ??? Erectile dysfunction    ??? Esophageal reflux    ??? History of pyloric stenosis    ??? Hypertension    ??? Insomnia    ??? Psoriasis     Dermatologist - Dr. Deloria Lair in [facility]   ??? Seizure (CMS-HCC) 02/20/2019   ??? Tobacco use     1ppd         @  Past Surgical History:   Procedure Laterality Date   ??? PYLOROMYOTOMY     ??? WISDOM TOOTH EXTRACTION            @     Allergies: Tramadol and Hydroxyzine                  Objective Findings  Precautions / Restrictions  Precautions: Falls precautions  Weight Bearing Status: Non-applicable  Required Braces or Orthoses: Non-applicable     Communication Preference: Verbal          Pain Comments: Pt denies pain currently.  Medical Tests / Procedures: H&P, orders, vitals, progress notes/consults  Equipment / Environment: Caregiver wearing mask for full session, Patient not wearing mask for full session, Telemetry, Vascular access (PIV, TLC, Port-a-cath, PICC), Bedside commode     At Rest: VSS per Epic, pt and RN agreeable to PT  With Activity: BP 132/86, HR ranging 105 to 142  Orthostatics: Asymptomatic with sit>stand  Airway Clearance: OOB mobility     Living Situation  Living Environment: Mobile home  Lives With: Spouse  Home Living: One level home, Ramped entrance, Tub/shower unit, Hand-held shower hose, Standard height toilet, Raised toilet seat with rails, Shower chair without back      Cognition  Cognition comment: Pt answering questions appropriately and in keeping with previous subjective taken at prior hospitalization.  Somnolent and falling asleep during guided exercises at end of session.  Visual / Perception status  Visual/Perception: Within Functional Limits (wears reading glasses)     Skin Inspection: Intact where visualized     UE ROM / Strength  UE ROM/Strength: Left Impaired/Limited, Right Impaired/Limited  RUE Impairment: Reduced coordination, Reduced strength  LUE Impairment: Reduced coordination, Reduced strength  UE comment: Pt unable to maintain smooth muscle contraction, with jerking ataxic movements and waxing/waning strength, strength grossly 4/5 in bilateraly UEs.  LE ROM / Strength  LE ROM/Strength: Left Impaired/Limited, Right Impaired/Limited  RLE Impairment: Reduced strength, Reduced coordination  LLE Impairment: Reduced strength, Reduced coordination  LE comment: Pt unable to maintain smooth muscle contraction, with jerking ataxic movements and waxing/waning strength, strength grossly 4/5 in bilateral LEs.     Motor/ Sensory/ Neuro  Coordination: Impaired  Coordination comment: Poor performance on finger-nose-finger and heel-to-shin as well as BLE RAM testing.  Sensation: [facility]  Sensation comment:  Intact to light touch throughout BLEs  Balance: Impaired, Impaired static sitting balance, Impaired static standing balance  Balance comment: Requires BUE support and mod-max assist for balance in standing with RW and sitting EOB with feet on floor and hands on EOB.      Bed Mobility: Sit>supine SBA for cues to pull legs fully into bed and align body appropriately to bed orientation     Transfers  Transfers: Sit to Stand  Sit to Stand comments: Sit>stand from [facility] with RW and mod-Ax2 for stability d/t significant BLE and trunk shaking when rising up to stand, cues for hand placement prior to transfer and min-A to guide hand to RW handle once in standing.  Pt requiring signifiant support to rise up to stand and maintain balance.  Stand>sit with mod-A for safety and cues to back up fully to EOB prior to sitting.      Gait  Level of Assistance: Maximum assist, patient does 25-49%  Assistive Device: Front wheel walker  Distance Ambulated (ft): 5 ft  Gait: Ambulated x5 ft with RW and max-A for stability, step-through gait with high variability of foot placement and significant shakiness throughout.                        Physical Therapy Session Duration  PT Individual [mins]: 30     Medical Staff Made Aware: RN Brad     I attest that I have reviewed the above information.  Signed: Maryan Puls, PT  Filed 05/20/2020

## 2020-05-20 NOTE — Unmapped (Signed)
Patient reports he would like alcohol detox. Has experienced seizures in past with detox. Spoke with PCP and they are concerned also for low sodium levels. Patient is unsteady. Denies drug use.  Last drink around 4 hours ago. 3 Mixed drinks/day.

## 2020-05-20 NOTE — Unmapped (Signed)
Patient admitted for management of etoh withdrawal, low ciwa at present.  New onset of Atrial fibrillation, treated with adenosine diltiazem fluid bolus, magnesium replacement and metoprolol in ED.  On transfer to CCU heart rate 120 - 130 AF with occasional pvc and stable BP. Patient educated about atrial fibrillation,  Dr. Veatrice Kells in to assess patient, plan of care and questions answered.  Patient instructed on all safety measures with emphasis on fall precautions and use of call bell.

## 2020-05-20 NOTE — Unmapped (Signed)
Care Management  Initial Transition Planning Assessment  Minimal discharge needs have been identified for this patient.  Anticipated needs are: SW     CM initial assessment completed telephonically as a precautionary measure in accordance with [facility] COVID-19 Pandemic emergency response plan. Patient verbalized understanding and agreement with completing this assessment by telephone.      Per EMR, patient is a 55 y.o. male with a past medical history significant for HTN, bipolar I disorder, alcohol use disorder c/b withdrawal seizures, recurrent c-diff who presents to the ED for alcohol withdrawal, and found to be in atrial fibrillation with RVR.  ??CM will continue to follow for avoidable delays and opportunities for progression of care.     Lives with wife and 2 kids who are 24 and 74. Patient stated he needs assistance with ADLs, uses [employer], [employer], [employer], [employer], CPAP. Lives in a 1 story residence that has ramp to get into the house. [facility] preference and wife will be his ride home.    05/20/2020 11:49 AM            General  Care Manager assessed the patient by : Telephone conversation with patient (Patient is on Enteric preacutions.)  Orientation Level: Oriented X4  Functional level prior to admission: Partially Assisted  Who provides care at home?: Family member  Level of assistance required: Bathing, Dressing, Eating, Toileting, Transferring  Reason for referral: Discharge Planning    Contact/Decision Maker  Extended Emergency Contact Information  Primary Emergency Contact: Bisbee,[name]  Address: [address]           [place], [place] [address] [place] [employer] Phone: [phone]  Relation: Spouse  Preferred language: ENGLISH  Interpreter needed? No    Legal Next of Kin / Guardian / POA / Advance Directives     Advance Directive (Medical Treatment)  Does patient have an advance directive covering medical treatment?: Patient has advance directive covering medical treatment, copy not in chart.  Advance directive covering medical treatment not in Chart:: Copy requested from family    Health Care Decision Maker [HCDM] (Medical & Mental Health Treatment)  Healthcare Decision Maker: HCDM documented in the HCDM/Contact Info section.  Information offered on HCDM, Medical & Mental Health advance directives:: Patient declined information.    Advance Directive (Mental Health Treatment)  Does patient have an advance directive covering mental health treatment?: Patient has advance directive covering mental health treatment, copy not in chart.  Advance directive covering mental health treatment not in Chart:: Copy requested from family.  Reason patient does not have an advance directive covering mental health treatment:: HCDM documented in the HCDM/Contact Info section.    Patient Information  Lives with: Spouse/significant other, Children    Type of Residence: Private residence    Type of Residence: Mailing Address:  [address]  [place] [address]  Contacts: Accompanied by: Alone  Patient Phone Number: [phone] (cell)           Medical Provider(s): Rupal Delmar Landau, MD  Reason for Admission: Admitting Diagnosis:  No admission diagnoses are documented for this encounter.  Past Medical History:   has a past medical history of Anxiety, Bipolar 1 disorder (CMS-HCC), Depressive disorder, Erectile dysfunction, Esophageal reflux, History of pyloric stenosis, Hypertension, Insomnia, Psoriasis, Seizure (CMS-HCC) (02/20/2019), and Tobacco use.  Past Surgical History:   has a past surgical history that includes Pyloromyotomy and Wisdom tooth extraction.   Previous admit date: 03/19/2020    Primary Insurance-  Payor: MEDICAID [place] / Plan: MEDICAID [place] ACCESS / Product Type: *No Product type* /   Secondary Insurance ??? Secondary Insurance  MEDICAID LME ALLIA*  Prescription Coverage ???   Preferred Pharmacy - [facility] [phone] - [address] [place] [place] AT [facility] [address]  [facility]    Transportation home: Private vehicle     Location/Detail: [place], [place], [address]    Support Systems/Concerns: Case Manager/Social Worker, Support Group    Responsibilities/Dependents at home?: Yes (Describe) (Lives with wife and kids 15 year and 73 year old.)    Home Care services in place prior to admission?: No     Equipment Currently Used at Home: walker, standard, cane, straight, walker, rolling, shower chair, respiratory supplies (CPAP)     Currently receiving outpatient dialysis?: No     Financial Information     Need for financial assistance?: No     Social Determinants of Health  Social Determinants of Health     Tobacco Use: High Risk   ??? Smoking Tobacco Use: Current Every Day Smoker   ??? Smokeless Tobacco Use: Never Used   Alcohol Use: Not At Risk   ??? How often do you have a drink containing alcohol?: 2 - 4 times per month   ??? How many drinks containing alcohol do you have on a typical day when you are drinking?: 3 - 4   ??? How often do you have 5 or more drinks on one occasion?: Monthly   Financial Resource Strain: Medium Risk   ??? Difficulty of Paying Living Expenses: Somewhat hard   Food Insecurity: No Food Insecurity   ??? Worried About Running Out of Food in the Last Year: Never true   ??? Ran Out of Food in the Last Year: Never true   Transportation Needs: No Transportation Needs   ??? Lack of Transportation (Medical): No   ??? Lack of Transportation (Non-Medical): No   Physical Activity: Not on file   Stress: Not on file   Social Connections: Not on file   Intimate Partner Violence: Not on file   Depression: Not on file   Housing/Utilities: Low Risk    ??? Within the past 12 months, have you ever stayed: outside, in a car, in a tent, in an overnight shelter, or temporarily in someone else's home (i.e. couch-surfing)?: No   ??? Are you worried about losing your housing?: No   ??? Within the past 12 months, have you been unable to get utilities (heat, electricity) when it was really needed?: No   Substance Use: Not on file   Health Literacy: Not on file     Discharge Needs Assessment  Concerns to be Addressed: discharge planning, substance/tobacco abuse/use    Clinical Risk Factors: Multiple Diagnoses (Chronic), Functional Limitations, Poor Health Literacy    Barriers to taking medications: No    Prior overnight hospital stay or ED visit in last 90 days: No    Readmission Within the Last 30 Days: no previous admission in last 30 days    Equipment Needed After Discharge: none    Discharge Facility/Level of Care Needs: substance abuse facility    Readmission  Risk of Unplanned Readmission Score: UNPLANNED READMISSION SCORE: 47%  Predictive Model Details          47% (High)  Factor Value    Calculated 05/20/2020 08:03 23% Number of ED visits in last six months 7  [facility] Risk of Unplanned Readmission Model 17% Number of hospitalizations in last year 6     15% Number of active Rx orders 37     5% Active antipsychotic Rx order present     5% ECG/EKG order present in last 6 months     5% Latest calcium low (8.3 mg/dL)     4% Encounter of ten days or longer in last year present     4% Diagnosis of electrolyte disorder present     4% Restraint order present in last 6 months     4% Imaging order present in last 6 months     3% Phosphorous result present     3% Diagnosis of deficiency anemia present     3% Age 2     3% Active anticoagulant Rx order present     1% Future appointment scheduled     1% Active ulcer medication Rx order present     0% Current length of stay 0.358 days      Readmitted Within the Last 30 Days? (No if blank)   Patient at risk for readmission?: Yes    Discharge Plan  Screen findings are: Discharge planning needs identified or anticipated (Comment).    Expected Discharge Date: TBD    Expected Transfer from Critical Care:  N/A    Quality data for continuing care services shared with patient and/or representative?: Yes  Patient and/or family were provided with choice of facilities / services that are available and appropriate to meet post hospital care needs?: Yes   List choices in order highest to lowest preferred, if applicable. : [facility]    Initial Assessment complete?: Yes

## 2020-05-20 NOTE — Unmapped (Signed)
Family Medicine Inpatient Service  History and Physical Note    Team: [facility] (pgr [phone])    PCP: Fabio Pierce, MD  Date of Admission: May 19, 2020  Code Status: full code  Emergency Contact: Extended Emergency Contact Information  Primary Emergency Contact: Bisbee,[name]  Address: [address] [place], [place] [address] [place]  Mobile Phone: [phone]  Relation: Spouse  Preferred language: ENGLISH  Interpreter needed? No     ASSESSMENT / PLAN:   Nicholas Rush is a 55 y.o. male with a past medical history significant for HTN, bipolar I disorder, alcohol use disorder c/b withdrawal seizures, recurrent c-diff who presents to the ED for alcohol withdrawal, and found to be in atrial fibrillation with RVR.    # Atrial Fibrillation: New onset A-fib with RVR, likely paroxysmal. On presentation to the ED, he was found to be tachy to the 160s/170s, peaked at 192. HR decreased to 110s w/ 2 rounds of diltiazem. Asymptomatic at this time and denies previous history of a-fib or heart disease. Echo done 02/2020 was normal. TSH mildly depressed at 0.485, with normal FT4.  Etiology for afib at this time includes, alcohol withdrawal, subclinical hyperthyroidism, possible c-diff infection.  CHADS-VASC of 1, likely not an anticoagulation candidate.  - s/p metoprolol tartrate 12.5mg  w/o adequate response > increase to 25mg  q6h  - consider additional Grave's Disease workup  - f/u C. Diff results (as below)  - Mg > 2, K > 4, replete PRN    # Alcohol Withdrawal: He reports drinking 3-4 bourbon shots per day, last drink at 6pm 05/19/20. He has withdrawn multiple times with complications including seizure with last withdrawal.   - CIWAS w/ triggered Ativan > add standing as indicated  - thiamine and folate daily  - zofran PRN  - melatonin at bedtime  - baclofen PRN  - increased home gabapentin dose to 200mg  BID    # Diarrhea: Two week history of loose stools with urgency and multiple occurrences of fecal incontinence. He reports a history of c-diff on two occasions. Reports his wife and her daughter both have diarrhea. Differential including malabsorption vs. c diff vs. Viral gastroenteritis.   - C-diff assay   - can give imodium PRN if c-diff ruled out    # Bipolar I Disorder: well-controlled on Depakote and Zyprexa.  Was previously on depakote with resultant hyponatremia and transitioned to lithium, though was then hospitalized for lithium toxicity.  - continue depakote 750mg  at bedtime and zyprexa 20mg  qhs    #HTN: Blood pressures have been normal since presentation to ED. Will stop amlodipine while using metoprolol for a-fib rate control.  - Discontinue amlodipine while inpatient    # Tobacco use disorder: Patient currently smoking 1PPD.   - Nicotine replacement therapy with patch    # FEN/GI:  - IVF None  - Check electrolytes as indicated, replete as needed.  - Diet Regular    # PPX:   - DVT: SQ Lovenox    # Dispo: Step down  [ ]  Anticipated Discharge Location: Home  [ ]  PT/OT/DME: No needs anticipated  [ ]  CM/SW needs: Substance use disorder resources  [ ]  Meds/Rx:  Not yet prescribed. No special med needs  [ ]  Teaching: None anticipated  [ ]  Follow up appt: Appointment needed  [ ]  Excuse letter: None anticipated  [ ]  Transport: Private     This patient  warrants inpatient care because of their severity of illness and the intensity of service that can only be performed in a hospital setting. This patient is expected to be hospitalized for greater than two midnights in the hospital because of the severity of their illness with hemodynamically instability due to underlying diagnosis including persistent tachycardia.     HISTORY OF PRESENT ILLNESS:  Nicholas Rush is a 55 y.o. male who presents at PCP's suggestion.    Pt states that his doctor sent him in due to lab abnormalities (to get his sodium checked).  He's had persistent diarrhea for the past several weeks, so have close household contacts.  Has had C. Diff in the past, but doesn't feel that this feels similarly.  With this diarrhea, has had a few episodes of fecal incontinence due to urgency.    Denies palpitations, SOB, CP. Denies previous history of a-fib.    Last drink 5/1 at 5pm, had a bourbon.  Drinks 3-4 drinks shots daily.  Has been drinking for about 40 years.  Has attempted to withdraw in the past, last detox was 4-5 months ago.  Was hospitalized at that time.  Has had seizures in the past, once related to hypoNa and another time related to alcohol withdrawal.    ED Course: Patient presented to the ED for alcohol detox and was tachycardic to 192, normotensive. He was found to be in a-fib with RVR. Given adenosine 6mg  x1, 12mg  x2 w/o response, then diltiazem 20mg  x1, 15mg  x1 with decrease of HR to 110s.  Labs remarkable for mild hyponatremia (133), Utox w/ cannabinoids, EtOH 183.2, Mg 1.3, TSH 0.485 w/ normal FT4.  CXR negative.  Given 1L bolus and Mg repletion.    COVID-19 Universal Testing on Admission (if asymptomatic, does not need repeat if done with the past 7 days): Asymptomatic & Negative     COVID-19 Vaccine: Booster inpatient    PAST MEDICAL / SURGICAL HX:  Past Medical History:   Diagnosis Date   ??? Anxiety    ??? Bipolar 1 disorder (CMS-HCC)    ??? Depressive disorder    ??? Erectile dysfunction    ??? Esophageal reflux    ??? History of pyloric stenosis    ??? Hypertension    ??? Insomnia    ??? Psoriasis     Dermatologist - Dr. Deloria Lair in [facility]   ??? Seizure (CMS-HCC) 02/20/2019   ??? Tobacco use     1ppd     Past Surgical History:   Procedure Laterality Date   ??? PYLOROMYOTOMY     ??? WISDOM TOOTH EXTRACTION         FAMILY HX:   Family History   Problem Relation Age of Onset   ??? Bipolar disorder Mother    ??? Depression Sister    ??? Alcohol abuse Sister    ??? Alcohol abuse Maternal Aunt    ??? Melanoma Neg Hx    ??? Basal cell carcinoma Neg Hx    ??? Squamous cell carcinoma Neg Hx        SOCIAL HX:   Social History     Socioeconomic History   ??? Marital status: Married Spouse name: None   ??? Number of children: None   ??? Years of education: None   ??? Highest education level: None   Occupational History   ??? None   Tobacco Use   ??? Smoking status: Current Every Day Smoker     Packs/day: 1.00     Years: 30.00  Pack years: 30.00     Types: Cigarettes   ??? Smokeless tobacco: Never Used   Vaping Use   ??? Vaping Use: Never used   Substance and Sexual Activity   ??? Alcohol use: Yes     Comment: 3 drinks a day.   ??? Drug use: Yes     Types: Marijuana   ??? Sexual activity: None   Other Topics Concern   ??? Do you use sunscreen? Yes   ??? Tanning bed use? Yes   ??? Are you easily burned? Yes   ??? Excessive sun exposure? No   ??? Blistering sunburns? Yes   Social History Narrative    Updated 01/03/2020        Lives in [place] with wife and two children.  Currently unemployed had Worked at [employer].  Current smoker.History of excessive ETOh abuse has been sober since September 2021.Marland Kitchen           PSYCHIATRIC HX:     -Current provider(s):  [facility]. Roxanne Gates     -Suicide attempts/SIB: NO    -Psych Hospitalizations:  2014 for depression at [facility]    -Med compliance hx: Poor,      -Fa hx suicide: YES, sister attempted         SUBSTANCE ABUSE HX:     -Current using substance: sig hx of alcohol use (last drink Sept 2021) and was hospitalized for withdrawal with DT    -Hx w/d sxs: YES    -Sz Hx: YES,    -DT Hx:YES, Sept 2021        SOCIAL HX:    -Current living environment: lives in [place] with wife and 2 childrne     -Current support: wife, father     -Violence (perp): verbal, has been kicking walls     -Access to Firearms: NO        -Guardian: NO        -Trauma: NO             Social Determinants of Health     Financial Resource Strain: Low Risk    ??? Difficulty of Paying Living Expenses: Not hard at all   Food Insecurity: No Food Insecurity   ??? Worried About Programme researcher, broadcasting/film/video in the Last Year: Never true   ??? Ran Out of Food in the Last Year: Never true   Transportation Needs: No Transportation Needs   ??? Lack of Transportation (Medical): No   ??? Lack of Transportation (Non-Medical): No   Physical Activity: Not on file   Stress: Not on file   Social Connections: Not on file       MEDICATIONS / ALLERGIES:  Medications Prior to Admission   Medication Sig Dispense Refill Last Dose   ??? acetaminophen (TYLENOL) 500 MG tablet Take 500 mg by mouth every six (6) hours as needed for pain.      ??? ADALIMUMAB SYRINGE CITRATE FREE 40 MG/0.4 ML Inject the contents of 1 syringe (40 mg) under the skin every 14 days 4 each 1    ??? amLODIPine (NORVASC) 5 MG tablet Take 1 tablet (5 mg total) by mouth daily. 30 tablet 11    ??? calcium carbonate (TUMS) 200 mg calcium (500 mg) chewable tablet Chew 2 tablets daily as needed for heartburn.      ??? calcium carbonate/vitamin D3 (CALCIUM 500 + D ORAL) Take 1 tablet by mouth daily.      ??? cetirizine (ZYRTEC) 10 MG tablet Take 10 mg by  mouth daily.      ??? divalproex ER (DEPAKOTE ER) 250 MG extended release 24 hr tablet Take 3 tablets (750 mg total) by mouth nightly. 270 tablet 1    ??? fluticasone propionate (FLONASE) 50 mcg/actuation nasal spray 2 sprays into each nostril daily. 16 g 12    ??? folic acid (FOLVITE) 1 MG tablet Take 1 tablet (1 mg total) by mouth daily. 30 tablet 11    ??? gabapentin (NEURONTIN) 100 MG capsule 100mg  each morning and 200mg  each night 180 capsule 0    ??? [EXPIRED] magnesium oxide (MAG-OX) 400 mg (241.3 mg elemental magnesium) tablet Take 2 tablets (800 mg total) by mouth daily. 120 tablet 0    ??? [EXPIRED] melatonin 3 mg Tab Take 1 tablet (3 mg total) by mouth every evening. 30 tablet 0    ??? multivitamin,tx-iron-minerals (THERA-M) Tab Take 1 tablet by mouth daily. 30 tablet 0    ??? naltrexone (DEPADE) 50 mg tablet Take 1 tablet (50 mg total) by mouth daily. 30 tablet 0    ??? nicotine (NICODERM CQ) 21 mg/24 hr patch Place 1 patch on the skin daily. 28 patch 5    ??? OLANZapine (ZYPREXA) 20 MG tablet Take 1 tablet (20 mg total) by mouth nightly. 30 tablet 0    ??? omeprazole (PRILOSEC) 20 MG capsule TAKE 1 CAPSULE(20 MG) BY MOUTH EVERY DAY 90 capsule 0    ??? sennosides (SENNA LAX ORAL) Take 1 tablet by mouth daily as needed (constipation).      ??? sodium chloride 1 gram tablet Take 1 tablet (1 g total) by mouth Three (3) times a day with a meal. 100 tablet 2        Allergies   Allergen Reactions   ??? Tramadol      Risk of seizures   ??? Hydroxyzine Itching     Sedation.  Ineffective for reducing anxiety.       IMMUNIZATIONS:   Immunization History   Administered Date(s) Administered   ??? COVID-19 VACCINE,MRNA(MODERNA)(PF)(IM) 04/26/2019, 06/08/2019   ??? INFLUENZA TIV (TRI) 28MO+ W/ PRESERV (IM) 11/17/2007   ??? INFLUENZA TIV (TRI) PF (IM) 02/20/2009   ??? Influenza Vaccine Quad (IIV4 PF) 39mo+ injectable 09/27/2018, 10/22/2019   ??? PNEUMOCOCCAL POLYSACCHARIDE 23 08/16/2014   ??? TdaP 02/20/2009       REVIEW OF SYSTEMS:  Pertinent positives and negatives per HPI. A complete review of systems otherwise negative.    PHYSICAL EXAM:    Initial ED Vitals:   ED Triage Vitals [05/19/20 2029]   Enc Vitals Group      BP 138/90      Heart Rate 108      SpO2 Pulse 108      Resp 16      Temp 36.6 ??C      Temp Source Oral      SpO2 100 %      Weight 89.4 kg (197 lb)      Height 1.829 m (6')      Head Circumference       Peak Flow       Pain Score       Pain Loc       Pain Edu?       Excl. in GC?        Recent Vitals:  Vitals:    05/20/20 0100   BP: 120/68   Pulse: 130   Resp: 19   Temp:    SpO2: 98%  GEN: Well-appearing, lying in bed, NAD   Eyes: PERRL. No scleral icterus. Conjunctiva non-erythematous. EOMI.  HEENT: NCAT, MMM. Oropharynx clear.  CV: Tachycardic and irregularly iregular rhythm. No murmurs/rubs/gallops.  Pulm: CTAB. No wheezing, crackles, or rhonchi.  Abd: Flat.  Nontender. No guarding, rebound.  Normoactive bowel sounds.    Neuro: A&O x 3. No focal deficits.   Ext: 1+ peripheral edema.    Skin: No rashes or skin lesions.        LABS/ STUDIES:  All imaging, laboratory studies, and other pertinent tests including electrocardiography were reviewed prior to admission and are summarized within the assessment and plan.     Micah Noel, MS3    I attest that I have reviewed the student note and that the components of the history of the present illness, the physical exam, and the assessment and plan documented were performed by me or were performed in my presence by the student where I verified the documentation and performed (or re-performed) the exam and medical decision making.     Fayne Mediate, MD PGY2  May 19, 2020 11:58 PM

## 2020-05-21 LAB — BASIC METABOLIC PANEL
ANION GAP: 6 mmol/L (ref 5–14)
ANION GAP: 5 mmol/L (ref 5–14)
BLOOD UREA NITROGEN: 12 mg/dL (ref 9–23)
BLOOD UREA NITROGEN: 11 mg/dL (ref 9–23)
BUN / CREAT RATIO: 16
BUN / CREAT RATIO: 15
CALCIUM: 8.2 mg/dL — ABNORMAL LOW (ref 8.7–10.4)
CALCIUM: 8.3 mg/dL — ABNORMAL LOW (ref 8.7–10.4)
CHLORIDE: 100 mmol/L (ref 98–107)
CHLORIDE: 106 mmol/L (ref 98–107)
CO2: 26.6 mmol/L (ref 20.0–31.0)
CO2: 23.8 mmol/L (ref 20.0–31.0)
CREATININE: 0.74 mg/dL
CREATININE: 0.72 mg/dL
EGFR CKD-EPI AA MALE: >90 mL/min/{1.73_m2} (ref >=60)
EGFR CKD-EPI AA MALE: >90 mL/min/{1.73_m2} (ref >=60)
EGFR CKD-EPI NON-AA MALE: >90 mL/min/{1.73_m2} (ref >=60)
EGFR CKD-EPI NON-AA MALE: >90 mL/min/{1.73_m2} (ref >=60)
GLUCOSE RANDOM: 137 mg/dL (ref 70–179)
GLUCOSE RANDOM: 115 mg/dL (ref 70–179)
POTASSIUM: 3.4 mmol/L (ref 3.4–4.8)
POTASSIUM: 4.7 mmol/L (ref 3.4–4.8)
SODIUM: 133 mmol/L — ABNORMAL LOW (ref 135–145)
SODIUM: 135 mmol/L (ref 135–145)

## 2020-05-21 LAB — CBC
HEMATOCRIT: 37.7 % — ABNORMAL LOW (ref 39.0–48.0)
HEMOGLOBIN: 13.3 g/dL (ref 12.9–16.5)
MEAN CORPUSCULAR HEMOGLOBIN CONC: 35.2 g/dL (ref 32.0–36.0)
MEAN CORPUSCULAR HEMOGLOBIN: 37.2 pg — ABNORMAL HIGH (ref 25.9–32.4)
MEAN CORPUSCULAR VOLUME: 105.6 fL — ABNORMAL HIGH (ref 77.6–95.7)
MEAN PLATELET VOLUME: 8.2 fL (ref 6.8–10.7)
PLATELET COUNT: 125 10*9/L — ABNORMAL LOW (ref 150–450)
RED BLOOD CELL COUNT: 3.57 10*12/L — ABNORMAL LOW (ref 4.26–5.60)
RED CELL DISTRIBUTION WIDTH: 17.5 % — ABNORMAL HIGH (ref 12.2–15.2)
WBC ADJUSTED: 5.3 10*9/L (ref 3.6–11.2)

## 2020-05-21 LAB — MAGNESIUM
MAGNESIUM: 1.3 mg/dL — ABNORMAL LOW (ref 1.6–2.6)
MAGNESIUM: 2.4 mg/dL (ref 1.6–2.6)

## 2020-05-21 MED ADMIN — sodium chloride tablet 1 g: 1 g | ORAL | @ 18:00:00

## 2020-05-21 MED ADMIN — magnesium sulfate 2gm/50mL IVPB: 2 g | INTRAVENOUS | @ 16:00:00 | Stop: 2020-05-21

## 2020-05-21 MED ADMIN — folic acid (FOLVITE) tablet 1 mg: 1 mg | ORAL | @ 13:00:00

## 2020-05-21 MED ADMIN — potassium chloride (KLOR-CON) CR tablet 40 mEq: 40 meq | ORAL | @ 16:00:00 | Stop: 2020-05-21

## 2020-05-21 MED ADMIN — gabapentin (NEURONTIN) capsule 200 mg: 200 mg | ORAL | @ 01:00:00

## 2020-05-21 MED ADMIN — thiamine mononitrate (vit B1) tablet 200 mg: 200 mg | ORAL | @ 18:00:00 | Stop: 2020-05-22

## 2020-05-21 MED ADMIN — magnesium sulfate 2gm/50mL IVPB: 2 g | INTRAVENOUS | @ 14:00:00 | Stop: 2020-05-21

## 2020-05-21 MED ADMIN — gabapentin (NEURONTIN) capsule 200 mg: 200 mg | ORAL | @ 13:00:00

## 2020-05-21 MED ADMIN — OLANZapine (ZyPREXA) tablet 20 mg: 20 mg | ORAL | @ 01:00:00

## 2020-05-21 MED ADMIN — sodium chloride tablet 1 g: 1 g | ORAL | @ 13:00:00

## 2020-05-21 MED ADMIN — potassium chloride (KLOR-CON) CR tablet 40 mEq: 40 meq | ORAL | @ 13:00:00 | Stop: 2020-05-21

## 2020-05-21 MED ADMIN — sodium chloride tablet 1 g: 1 g | ORAL | @ 03:00:00

## 2020-05-21 MED ADMIN — melatonin tablet 3 mg: 3 mg | ORAL | @ 21:00:00

## 2020-05-21 MED ADMIN — dilTIAZem (CARDIZEM) tablet 60 mg: 60 mg | ORAL | @ 04:00:00

## 2020-05-21 MED ADMIN — dilTIAZem (CARDIZEM) tablet 60 mg: 60 mg | ORAL | @ 11:00:00

## 2020-05-21 MED ADMIN — pantoprazole (PROTONIX) EC tablet 40 mg: 40 mg | ORAL | @ 13:00:00

## 2020-05-21 MED ADMIN — dilTIAZem (CARDIZEM) tablet 60 mg: 60 mg | ORAL | @ 16:00:00

## 2020-05-21 MED ADMIN — fidaxomicin (DIFICID) tablet 200 mg: 200 mg | ORAL | @ 14:00:00 | Stop: 2020-05-31

## 2020-05-21 MED ADMIN — enoxaparin (LOVENOX) syringe 40 mg: 40 mg | SUBCUTANEOUS | @ 13:00:00

## 2020-05-21 MED ADMIN — thiamine mononitrate (vit B1) tablet 200 mg: 200 mg | ORAL | @ 01:00:00 | Stop: 2020-05-22

## 2020-05-21 MED ADMIN — thiamine mononitrate (vit B1) tablet 200 mg: 200 mg | ORAL | @ 11:00:00 | Stop: 2020-05-22

## 2020-05-21 MED ADMIN — dilTIAZem (CARDIZEM) tablet 60 mg: 60 mg | ORAL | @ 21:00:00

## 2020-05-21 MED ADMIN — divalproex ER (DEPAKOTE ER) extended release 24 hr tablet 750 mg: 750 mg | ORAL | @ 01:00:00

## 2020-05-21 MED ADMIN — nicotine (NICODERM CQ) 21 mg/24 hr patch 1 patch: 1 | TRANSDERMAL | @ 13:00:00

## 2020-05-21 NOTE — Unmapped (Signed)
Pt is A&Ox4, able to follow commands, vitals stable, Afib on the monitor, pt having some elevated heart rate, mostly with movement. Pt has only had one BM today. Pt CIWAA stable with a tremor only. PT week, but was able to get sponge bath with OT today. One person assist to bedside commode. Will continue to monitor pt. Closely.     Problem: Adult Inpatient Plan of Care  Goal: Plan of Care Review  Outcome: Progressing  Goal: Patient-Specific Goal (Individualized)  Outcome: Progressing  Goal: Absence of Hospital-Acquired Illness or Injury  Outcome: Progressing  Intervention: Identify and Manage Fall Risk  Recent Flowsheet Documentation  Taken 05/21/2020 0800 by Ronn Melena, RN  Safety Interventions:   bed alarm   environmental modification   fall reduction program maintained   isolation precautions   lighting adjusted for tasks/safety   low bed  Intervention: Prevent Skin Injury  Recent Flowsheet Documentation  Taken 05/21/2020 1600 by Ronn Melena, RN  Skin Protection:   adhesive use limited   cleansing with dimethicone incontinence wipes   drying agents applied   incontinence pads utilized   tubing/devices free from skin contact  Taken 05/21/2020 1400 by Ronn Melena, RN  Skin Protection: adhesive use limited  Taken 05/21/2020 1200 by Ronn Melena, RN  Skin Protection:   adhesive use limited   drying agents applied   cleansing with dimethicone incontinence wipes   incontinence pads utilized   tubing/devices free from skin contact  Taken 05/21/2020 1000 by Ronn Melena, RN  Skin Protection:   adhesive use limited   cleansing with dimethicone incontinence wipes   drying agents applied   incontinence pads utilized   tubing/devices free from skin contact  Taken 05/21/2020 0800 by Ronn Melena, RN  Skin Protection:   adhesive use limited   cleansing with dimethicone incontinence wipes   drying agents applied   incontinence pads utilized   tubing/devices free from skin contact  Intervention: Prevent Infection  Recent Flowsheet Documentation  Taken 05/21/2020 0800 by Ronn Melena, RN  Infection Prevention:   environmental surveillance performed   equipment surfaces disinfected   hand hygiene promoted   single patient room provided   rest/sleep promoted   personal protective equipment utilized  Goal: Optimal Comfort and Wellbeing  Outcome: Progressing  Goal: Readiness for Transition of Care  Outcome: Progressing  Goal: Rounds/Family Conference  Outcome: Progressing     Problem: Infection  Goal: Absence of Infection Signs and Symptoms  Outcome: Progressing  Intervention: Prevent or Manage Infection  Recent Flowsheet Documentation  Taken 05/21/2020 0800 by Ronn Melena, RN  Infection Management: aseptic technique maintained  Isolation Precautions: enteric precautions maintained     Problem: Skin Injury Risk Increased  Goal: Skin Health and Integrity  Outcome: Progressing  Intervention: Optimize Skin Protection  Recent Flowsheet Documentation  Taken 05/21/2020 1600 by Ronn Melena, RN  Pressure Reduction Techniques: frequent weight shift encouraged  Pressure Reduction Devices: pressure-redistributing mattress utilized  Skin Protection:   adhesive use limited   cleansing with dimethicone incontinence wipes   drying agents applied   incontinence pads utilized   tubing/devices free from skin contact  Taken 05/21/2020 1400 by Ronn Melena, RN  Pressure Reduction Techniques: frequent weight shift encouraged  Pressure Reduction Devices: pressure-redistributing mattress utilized  Skin Protection: adhesive use limited  Taken 05/21/2020 1200 by Ronn Melena, RN  Pressure Reduction Techniques: frequent weight shift encouraged  Pressure Reduction Devices: pressure-redistributing mattress utilized  Skin Protection:   adhesive use limited   drying agents  applied   cleansing with dimethicone incontinence wipes   incontinence pads utilized   tubing/devices free from skin contact  Taken 05/21/2020 1000 by Ronn Melena, RN  Pressure Reduction Techniques: frequent weight shift encouraged  Pressure Reduction Devices: pressure-redistributing mattress utilized  Skin Protection:   adhesive use limited   cleansing with dimethicone incontinence wipes   drying agents applied   incontinence pads utilized   tubing/devices free from skin contact  Taken 05/21/2020 0800 by Ronn Melena, RN  Pressure Reduction Techniques:   frequent weight shift encouraged   heels elevated off bed   weight shift assistance provided  Pressure Reduction Devices: pressure-redistributing mattress utilized  Skin Protection:   adhesive use limited   cleansing with dimethicone incontinence wipes   drying agents applied   incontinence pads utilized   tubing/devices free from skin contact     Problem: Fall Injury Risk  Goal: Absence of Fall and Fall-Related Injury  Outcome: Progressing  Intervention: Promote Scientist, clinical (histocompatibility and immunogenetics) Documentation  Taken 05/21/2020 0800 by Ronn Melena, RN  Safety Interventions:   bed alarm   environmental modification   fall reduction program maintained   isolation precautions   lighting adjusted for tasks/safety   low bed     Problem: Self-Care Deficit  Goal: Improved Ability to Complete Activities of Daily Living  Outcome: Progressing     Problem: Dysrhythmia  Goal: Normalized Cardiac Rhythm  Outcome: Progressing     Problem: Behavioral Health Comorbidity  Goal: Maintenance of Behavioral Health Symptom Control  Outcome: Progressing     Problem: Hypertension Comorbidity  Goal: Blood Pressure in Desired Range  Outcome: Progressing     Problem: Obstructive Sleep Apnea Risk or Actual Comorbidity Management  Goal: Unobstructed Breathing During Sleep  Outcome: Progressing     Problem: Pain Chronic (Persistent) (Comorbidity Management)  Goal: Acceptable Pain Control and Functional Ability  Outcome: Progressing  Intervention: Manage Persistent Pain  Recent Flowsheet Documentation  Taken 05/21/2020 0800 by Ronn Melena, RN  Sleep/Rest Enhancement: awakenings minimized     Problem: Seizure Disorder Comorbidity  Goal: Maintenance of Seizure Control  Outcome: Progressing     Problem: Alcohol Withdrawal  Goal: Alcohol Withdrawal Symptom Control  Outcome: Progressing     Problem: Acute Neurologic Deterioration (Alcohol Withdrawal)  Goal: Optimal Neurologic Function  Outcome: Progressing     Problem: Substance Misuse (Alcohol Withdrawal)  Goal: Readiness for Change Identified  Outcome: Progressing

## 2020-05-21 NOTE — Unmapped (Signed)
Family Medicine Inpatient Service Progress Note    Team: [facility] (pgr [phone])    Hospital Day: 2    ASSESSMENT / PLAN:   Nicholas Rush is a 55 y.o. male with a past medical history significant for HTN, bipolar I disorder, alcohol use disorder c/b withdrawal seizures, recurrent c-diff who presented to the ED for alcohol withdrawal, and found to be in atrial fibrillation with RVR.    # Atrial Fibrillation:   Patient currently tachycardic in A. Fib with irregular rhythm. Rate is more controlled < 110   Etiology for afib at this time includes c. diff infection, alcohol withdrawal, subclinical hyperthyroidism (less likely given absence of typical exam findings), cardiac (valvular disease, HF).  - continue diltiazem 60mg  q6h (consider increase in coming days depending on HR)  - thyrotropin receptor antibodies pending for Grave's Disease workup  - C. Diff positive, treating as below  - K 3.4, repleted. Mg 1.3, repleted (goal Mg > 2, K > 4)  - consider restarting home daily mag-ox in AM  - consider echo once HR improved  - consider repeat lipids, A1C for cardiac risk factor workup (both last performed in 02/2020)  ??  # Alcohol Withdrawal - Alcohol Use Disorder:   Patient reports drinking 3-4 bourbon shots per day, last drink at 6pm 05/19/20. He has withdrawn multiple times with complications including seizure with last withdrawal. Withdrawal symptoms have been very mild thus far, with CIWA of 4 x 4 times in past 24h. Highest CIWA score this admission has been 8 (on 5/2 at 4am).  - continue CIWAs w/ symptom triggered Ativan. If patient begins consistently scoring higher, will consider scheduling ativan.  - thiamine and folate daily  - zofran PRN  - melatonin at bedtime  - baclofen PRN  - continue gabapentin 200mg  BID  ??  # C. difficile infection, history of recurrent infections- last in May 2021  Patient has had 3 episodes of diarrhea over past 24hrs  - C-diff positive  - will start oral antibiotics-fidaxomicin 200mg  BID x 10days     # Bipolar I Disorder: well-controlled on Depakote and Zyprexa  - continue depakote 750mg  at bedtime and zyprexa 20mg  at bedtime. No indication for depakote level at this time.   ??  #HTN:   Patient normotensive this AM  -Stopped amlodipine given softer pressures and currently taking diltiazem   ??  # Tobacco use disorder:   Patient currently smoking 1PPD.   - Nicotine replacement therapy with patch  - tobacco cessation team consulted  ??  # Checklist:  - IVF: none  - Tubes/Lines/Drains: peripheral IV  - Diet Regular  - Bowel Regimen: No indication for a bowel regimen at this time, active diarrhea  - DVT: SQ Lovenox  - Code Status:   Orders Placed This Encounter   Procedures   ??? Full Code     Standing Status:   Standing     Number of Occurrences:   1     - Dispo: Step down    [ ]  Anticipated Discharge Location: SNF   [ ]  PT/OT/DME: PT/OT both 5xL  [ ]  CM/SW needs: Substance abuse resources, SNF arrangement  [ ]  Meds/Rx:  Not yet prescribed. No special med needs  [ ]  Teaching: None anticipated  [ ]  Follow up appt: Appointment needed  [ ]  Excuse letter: None anticipated  [ ]  Transport: Ambulance Needed    SUBJECTIVE:  Interval events: patient tachy to 127 overnight and given 10mg   IV diltiazem then 30mg  PO with improvement. Scheduled diltiazem increased to 60mg  PO.    Patient reports that he is feeling okay. Denies chest pain, shortness of breath, nausea/vomiting, diarrhea, headaches, dizziness, tremors, abdominal pain. Urinating normally. Has had 3 episodes of diarrhea over past 24hrs.     Wife states that patient has not been caring for himself well over the past year. When at home, sits in one place and does not move for most of day. Hygiene has declined.     REVIEW OF SYSTEMS:  Pertinent positives and negatives per HPI. A complete review of systems otherwise negative.    PHYSICAL EXAM:      Intake/Output Summary (Last 24 hours) at 05/21/2020 1555  Last data filed at 05/21/2020 1000  Gross per 24 hour   Intake 590.42 ml   Output 525 ml   Net 65.42 ml       Recent Vitals:  Vitals:    05/21/20 1412   BP: 141/98   Pulse: 133   Resp: 25   Temp:    SpO2:      GEN: tired appearing, lying in bed, NAD  HEENT: NCAT, No scleral icterus. Conjunctiva non-erythematous. MMM.  CV: Tachycardic w/ irregular rhythm. No murmurs/rubs/gallops.  Pulm: Normal work of breathing on room air. Diffuse rhonchi bilaterally. No wheezing, crackles.  Abd: Flat.  Nontender. No guarding, rebound.  Normoactive bowel sounds.    Neuro: A&O x 3. No focal deficits.  Ext: Trace pitting peripheral edema on right, 1+ pitting edema along shins on left. Palpable distal pulses. Mild tremor on extension of hands  Skin: No rashes or skin lesions.     LABS/ STUDIES:    All imaging, laboratory studies, and other pertinent tests including electrocardiography within the last 24 hours were reviewed and are summarized within the assessment and plan.     NUTRITION:                         [name], MS4     I attest that I have reviewed the student note and that the components of the history of the present illness, the physical exam, and the assessment and plan documented were performed by me or were performed in my presence by the student where I verified the documentation and performed (or re-performed) the exam and medical decision making.     Jana Half, MD PGY1

## 2020-05-21 NOTE — Unmapped (Signed)
Problem: Adult Inpatient Plan of Care  Goal: Plan of Care Review  Outcome: Progressing  Goal: Patient-Specific Goal (Individualized)  Outcome: Progressing  Goal: Absence of Hospital-Acquired Illness or Injury  Outcome: Progressing  Intervention: Identify and Manage Fall Risk  Recent Flowsheet Documentation  Taken 05/20/2020 2000 by [name] Nottingham, RN  Safety Interventions:   bed alarm   fall reduction program maintained   infection management   isolation precautions   lighting adjusted for tasks/safety   low bed   nonskid shoes/slippers when out of bed   room near unit station   supervised activity  Intervention: Prevent Skin Injury  Recent Flowsheet Documentation  Taken 05/21/2020 0000 by [name] Nottingham, RN  Skin Protection:   adhesive use limited   incontinence pads utilized   transparent dressing maintained  Taken 05/20/2020 2000 by [name] Nottingham, RN  Skin Protection: adhesive use limited  Intervention: Prevent and Manage VTE (Venous Thromboembolism) Risk  Recent Flowsheet Documentation  Taken 05/20/2020 2000 by [name] Nottingham, RN  Activity Management: activity adjusted per tolerance  Intervention: Prevent Infection  Recent Flowsheet Documentation  Taken 05/20/2020 2000 by [name] Nottingham, RN  Infection Prevention:   cohorting utilized   environmental surveillance performed   equipment surfaces disinfected   hand hygiene promoted   personal protective equipment utilized   rest/sleep promoted   single patient room provided  Goal: Optimal Comfort and Wellbeing  Outcome: Progressing  Goal: Readiness for Transition of Care  Outcome: Progressing  Goal: Rounds/Family Conference  Outcome: Progressing     Problem: Infection  Goal: Absence of Infection Signs and Symptoms  Outcome: Progressing  Intervention: Prevent or Manage Infection  Recent Flowsheet Documentation  Taken 05/20/2020 2000 by [name] Nottingham, RN  Infection Management: aseptic technique maintained  Isolation Precautions: enteric precautions maintained Problem: Skin Injury Risk Increased  Goal: Skin Health and Integrity  Outcome: Progressing  Intervention: Optimize Skin Protection  Recent Flowsheet Documentation  Taken 05/21/2020 0600 by [name] Nottingham, RN  Pressure Reduction Techniques: frequent weight shift encouraged  Taken 05/21/2020 0400 by [name] Nottingham, RN  Pressure Reduction Techniques: frequent weight shift encouraged  Taken 05/21/2020 0200 by [name] Nottingham, RN  Pressure Reduction Techniques: frequent weight shift encouraged  Taken 05/21/2020 0000 by [name] Nottingham, RN  Pressure Reduction Techniques: frequent weight shift encouraged  Pressure Reduction Devices: positioning supports utilized  Skin Protection:   adhesive use limited   incontinence pads utilized   transparent dressing maintained  Taken 05/20/2020 2200 by [name] Nottingham, RN  Pressure Reduction Techniques: frequent weight shift encouraged  Taken 05/20/2020 2000 by [name] Nottingham, RN  Pressure Reduction Techniques: frequent weight shift encouraged  Pressure Reduction Devices: positioning supports utilized  Skin Protection: adhesive use limited     Problem: Fall Injury Risk  Goal: Absence of Fall and Fall-Related Injury  Outcome: Progressing  Intervention: Promote Scientist, clinical (histocompatibility and immunogenetics) Documentation  Taken 05/20/2020 2000 by [name] Nottingham, RN  Safety Interventions:   bed alarm   fall reduction program maintained   infection management   isolation precautions   lighting adjusted for tasks/safety   low bed   nonskid shoes/slippers when out of bed   room near unit station   supervised activity     Problem: Self-Care Deficit  Goal: Improved Ability to Complete Activities of Daily Living  Outcome: Progressing     Problem: Dysrhythmia  Goal: Normalized Cardiac Rhythm  Outcome: Progressing     Problem:  Behavioral Health Comorbidity  Goal: Maintenance of Behavioral Health Symptom Control  Outcome: Progressing     Problem: Hypertension Comorbidity  Goal: Blood Pressure in Desired Range  Outcome: Progressing     Problem: Obstructive Sleep Apnea Risk or Actual Comorbidity Management  Goal: Unobstructed Breathing During Sleep  Outcome: Progressing     Problem: Pain Chronic (Persistent) (Comorbidity Management)  Goal: Acceptable Pain Control and Functional Ability  Outcome: Progressing  Intervention: Manage Persistent Pain  Recent Flowsheet Documentation  Taken 05/21/2020 0400 by [name] Nottingham, RN  Sleep/Rest Enhancement:   awakenings minimized   noise level reduced   room darkened     Problem: Seizure Disorder Comorbidity  Goal: Maintenance of Seizure Control  Outcome: Progressing     Problem: Alcohol Withdrawal  Goal: Alcohol Withdrawal Symptom Control  Outcome: Progressing     Problem: Acute Neurologic Deterioration (Alcohol Withdrawal)  Goal: Optimal Neurologic Function  Outcome: Progressing     Problem: Substance Misuse (Alcohol Withdrawal)  Goal: Readiness for Change Identified  Outcome: Progressing

## 2020-05-21 NOTE — Unmapped (Signed)
Inpatient Tobacco Cessation Counseling Note    This medical encounter was conducted virtually using [email or web address]  TeleHealth protocols.    I have identified myself to the patient and conveyed my credentials to [name]  I have explained the capabilities and limitations of telemedicine and the patient/proxy and myself both agree that it is appropriate for their current circumstances/symptoms.     Contact Information  Person Contacted: Lavena Bullion Rush         Contact Phone number: [phone]      Phone Outcome: spoke with pt  Is there someone else in the room? No.     Patient's location at the time of the telephone visit: Hospitalized at [facility]   Provider's location at the time of the telephone visit: At home, in [place]      Purpose of contact:     Pt participated in a telephone visit for tobacco cessation counseling.  Patient was admitted to hospital for alcohol withdrawal, and found to be in atrial fibrillation with RVR. Patient consented to telephone visit given due to social isolation measures in place due to the COVID-19 pandemic.     Tobacco Use History and Assessment  Time Since Last Tobacco Use: 1 to 7 days ago  Tobacco Withdrawal (Past 24 Hours): None noted  Type of Tobacco Products Used: Cigarettes  Quantity Used: 20  Quantity Per: day  Time to First Use After Waking: Immediately  Other Household Members Use Tobacco: Yes  Age Began Use (Years Old): 16  Longest Time Without Tobacco: Months  # of Hours, Days, Weeks, Months or Years: 6  Most Recent Attempt: 6-10 years  Medications Used in Past Attempts: Nicotine Patch, Nicotine Gum       Behavioral Assessment  Why Uses: 1. habit 2. stress-relief 3. smokes while drinking alcohol 4. wife smokes 5. smokes after meals  Reasons to Become Tobacco Free: health  Barriers/Challenges: 1. long-standing habit 2. stress 3. wife smokes 4. cigarettes are paired with food and alcohol  Strategies: pt can 1) use NRT to manage cravings 2) use spacing strategies 3) use hand-to-mouth replacements 4) abstain from alcohol    NOTE: Pt denies having cravings to smoke and states patch he has on is effective. SW and pt discussed nicotine gum and lozenges and pt agrees to use patch with gum or lozenge. SW provided education on recommended duration for use post-discharge. Pt voices desire to become tobacco-free. Pt and SW discussed triggers, hand-to-mouth replacements, and spacing strategies. Pt states he had a 40-month quit period about 10 years ago and found using straws to puff/chew on helped him space cigarettes. SW reinforced health benefits of becoming tobacco-free, specifically in context of pt's HTN and afib. SW provided pt with contact information, physical improvements related to tobacco cessation, and available resources (including outpatient Tobacco Treatment Program at [facility] and [place] [facility]).      Treatment Plan  Please see below for medication recommendations in bold.   Cessation Meds Currently Using: Patch 21mg   Medications Recommended During Hospitalization: Patch 21mg , Gum 4mg , Lozenge 4mg   Outpatient/Discharge Medications Recommended: Patch 21mg , Gum 4mg , Lozenge 4mg   Plan to Obtain Outpatient Meds: TTS messaged providers for Rx  Patient's Plan Post Discharge/Visit: Plan to quit as soon as possible      As part of this Telephone Visit, no in-person exam was conducted.     I personally spent 17 minutes counseling the patient via telephone about tobacco cessation.  I spent an additional  11 minutes on pre- and post-visit activities.      The patient was physically located in [place] or a state in which I am permitted to provide care. The patient and/or parent/guardian understood that s/he may incur co-pays and cost sharing, and agreed to the telemedicine visit. The visit was reasonable and appropriate under the circumstances given the patient's presentation at the time.     The patient and/or parent/guardian has been advised of the potential risks and limitations of this mode of treatment (including, but not limited to, the absence of in-person examination) and has agreed to be treated using telemedicine. The patient's/patient's family's questions regarding telemedicine have been answered.      If the visit was completed in an ambulatory setting, the patient and/or parent/guardian has also been advised to contact their provider???s office for worsening conditions, and seek emergency medical treatment and/or call 911 if the patient deems either necessary.     Visit Format/Coding: Telephone     Coding: 16109 (11-20 minutes)  Service rendered over the phone most consistent with: Tobacco cessation counseling, greater than 10 minutes (60454)       Venancio Poisson, LCSW  Clinical Social Worker / Tobacco Treatment Specialist  [facility]  Phone: [phone]

## 2020-05-21 NOTE — Unmapped (Signed)
Status: SD ISO rapid Afib with RVR    Gen: afebrile, CIWA score max 6 this shift  Neuro: A&O x 4, intermittently slow to respond to questions  CV: Afib  Resp: room air  GI/GU: BM x 2 this shift, uses urinal sitting on side of bed  Diet: regular, good appetite  Skin: intact    Plan: control Afib rate, increase strength to max benefit from rehab upon discharge, control ETOH withdrawal    Will continue to monitor.         Problem: Adult Inpatient Plan of Care  Goal: Plan of Care Review  Outcome: Ongoing - Unchanged  Goal: Patient-Specific Goal (Individualized)  Outcome: Ongoing - Unchanged  Goal: Absence of Hospital-Acquired Illness or Injury  Outcome: Ongoing - Unchanged  Intervention: Identify and Manage Fall Risk  Recent Flowsheet Documentation  Taken 05/20/2020 0800 by Gorden Harms, RN  Safety Interventions:  ??? bed alarm  ??? commode/urinal/bedpan at bedside  ??? environmental modification  ??? fall reduction program maintained  ??? family at bedside  ??? infection management  ??? lighting adjusted for tasks/safety  ??? low bed  ??? nonskid shoes/slippers when out of bed  ??? room near unit station  ??? supervised activity  ??? toileting scheduled  Intervention: Prevent Skin Injury  Recent Flowsheet Documentation  Taken 05/20/2020 0800 by Gorden Harms, RN  Skin Protection:  ??? adhesive use limited  ??? cleansing with dimethicone incontinence wipes  ??? incontinence pads utilized  ??? protective footwear used  ??? skin-to-device areas padded  ??? transparent dressing maintained  ??? tubing/devices free from skin contact  Intervention: Prevent and Manage VTE (Venous Thromboembolism) Risk  Recent Flowsheet Documentation  Taken 05/20/2020 0800 by Gorden Harms, RN  Activity Management: activity adjusted per tolerance  Range of Motion: Bilateral Upper and Lower Extremities  Intervention: Prevent Infection  Recent Flowsheet Documentation  Taken 05/20/2020 0800 by Gorden Harms, RN  Infection Prevention:  ??? cohorting utilized  ??? environmental surveillance performed  ??? equipment surfaces disinfected  ??? hand hygiene promoted  ??? personal protective equipment utilized  ??? rest/sleep promoted  ??? single patient room provided  Goal: Optimal Comfort and Wellbeing  Outcome: Ongoing - Unchanged  Goal: Readiness for Transition of Care  Outcome: Ongoing - Unchanged  Goal: Rounds/Family Conference  Outcome: Ongoing - Unchanged     Problem: Infection  Goal: Absence of Infection Signs and Symptoms  Outcome: Ongoing - Unchanged  Intervention: Prevent or Manage Infection  Recent Flowsheet Documentation  Taken 05/20/2020 0800 by Gorden Harms, RN  Infection Management: aseptic technique maintained  Isolation Precautions: contact precautions maintained     Problem: Skin Injury Risk Increased  Goal: Skin Health and Integrity  Outcome: Ongoing - Unchanged  Intervention: Optimize Skin Protection  Recent Flowsheet Documentation  Taken 05/20/2020 1800 by Gorden Harms, RN  Pressure Reduction Techniques:  ??? frequent weight shift encouraged  ??? heels elevated off bed  ??? positioned off wounds  Pressure Reduction Devices:  ??? positioning supports utilized  ??? pressure-redistributing mattress utilized  ??? specialty bed utilized  Taken 05/20/2020 1600 by Gorden Harms, RN  Pressure Reduction Techniques:  ??? frequent weight shift encouraged  ??? heels elevated off bed  ??? weight shift assistance provided  Pressure Reduction Devices:  ??? positioning supports utilized  ??? pressure-redistributing mattress utilized  ??? specialty bed utilized  Taken 05/20/2020 1400 by Gorden Harms, RN  Pressure Reduction Techniques:  ??? frequent weight shift encouraged  ??? heels elevated off bed  ??? positioned off wounds  ???  weight shift assistance provided  Pressure Reduction Devices:  ??? positioning supports utilized  ??? pressure-redistributing mattress utilized  ??? specialty bed utilized  Taken 05/20/2020 1200 by Gorden Harms, RN  Pressure Reduction Techniques:  ??? frequent weight shift encouraged  ??? heels elevated off bed  ??? positioned off wounds  ??? weight shift assistance provided  Pressure Reduction Devices:  ??? positioning supports utilized  ??? pressure-redistributing mattress utilized  ??? specialty bed utilized  Taken 05/20/2020 1000 by Gorden Harms, RN  Pressure Reduction Techniques:  ??? frequent weight shift encouraged  ??? heels elevated off bed  ??? positioned off wounds  ??? pressure points protected  ??? weight shift assistance provided  Pressure Reduction Devices:  ??? positioning supports utilized  ??? pressure-redistributing mattress utilized  ??? specialty bed utilized  Taken 05/20/2020 0800 by Gorden Harms, RN  Pressure Reduction Techniques:  ??? frequent weight shift encouraged  ??? heels elevated off bed  ??? positioned off wounds  ??? weight shift assistance provided  Pressure Reduction Devices:  ??? positioning supports utilized  ??? pressure-redistributing mattress utilized  ??? specialty bed utilized  Skin Protection:  ??? adhesive use limited  ??? cleansing with dimethicone incontinence wipes  ??? incontinence pads utilized  ??? protective footwear used  ??? skin-to-device areas padded  ??? transparent dressing maintained  ??? tubing/devices free from skin contact     Problem: Fall Injury Risk  Goal: Absence of Fall and Fall-Related Injury  Outcome: Ongoing - Unchanged  Intervention: Promote Injury-Free Environment  Recent Flowsheet Documentation  Taken 05/20/2020 0800 by Gorden Harms, RN  Safety Interventions:  ??? bed alarm  ??? commode/urinal/bedpan at bedside  ??? environmental modification  ??? fall reduction program maintained  ??? family at bedside  ??? infection management  ??? lighting adjusted for tasks/safety  ??? low bed  ??? nonskid shoes/slippers when out of bed  ??? room near unit station  ??? supervised activity  ??? toileting scheduled     Problem: Self-Care Deficit  Goal: Improved Ability to Complete Activities of Daily Living  Outcome: Ongoing - Unchanged     Problem: Dysrhythmia  Goal: Normalized Cardiac Rhythm  Outcome: Ongoing - Unchanged Problem: Behavioral Health Comorbidity  Goal: Maintenance of Behavioral Health Symptom Control  Outcome: Ongoing - Unchanged     Problem: Hypertension Comorbidity  Goal: Blood Pressure in Desired Range  Outcome: Ongoing - Unchanged     Problem: Obstructive Sleep Apnea Risk or Actual Comorbidity Management  Goal: Unobstructed Breathing During Sleep  Outcome: Ongoing - Unchanged     Problem: Pain Chronic (Persistent) (Comorbidity Management)  Goal: Acceptable Pain Control and Functional Ability  Outcome: Ongoing - Unchanged  Intervention: Manage Persistent Pain  Recent Flowsheet Documentation  Taken 05/20/2020 0800 by Gorden Harms, RN  Sleep/Rest Enhancement:  ??? awakenings minimized  ??? consistent schedule promoted  ??? natural light exposure provided  ??? noise level reduced  ??? regular sleep/rest pattern promoted  ??? relaxation techniques promoted     Problem: Seizure Disorder Comorbidity  Goal: Maintenance of Seizure Control  Outcome: Ongoing - Unchanged

## 2020-05-22 LAB — CBC
HEMATOCRIT: 37.4 % — ABNORMAL LOW (ref 39.0–48.0)
HEMOGLOBIN: 13.3 g/dL (ref 12.9–16.5)
MEAN CORPUSCULAR HEMOGLOBIN CONC: 35.5 g/dL (ref 32.0–36.0)
MEAN CORPUSCULAR HEMOGLOBIN: 37.3 pg — ABNORMAL HIGH (ref 25.9–32.4)
MEAN CORPUSCULAR VOLUME: 105.1 fL — ABNORMAL HIGH (ref 77.6–95.7)
MEAN PLATELET VOLUME: 8.1 fL (ref 6.8–10.7)
PLATELET COUNT: 126 10*9/L — ABNORMAL LOW (ref 150–450)
RED BLOOD CELL COUNT: 3.56 10*12/L — ABNORMAL LOW (ref 4.26–5.60)
RED CELL DISTRIBUTION WIDTH: 17.4 % — ABNORMAL HIGH (ref 12.2–15.2)
WBC ADJUSTED: 4.4 10*9/L (ref 3.6–11.2)

## 2020-05-22 LAB — THYROTROPIN RECEPTOR ANTIBODY: THYROTROPIN RECEPTOR ANTIBODY: <1.1 IU/L

## 2020-05-22 LAB — BASIC METABOLIC PANEL
ANION GAP: 7 mmol/L (ref 5–14)
BLOOD UREA NITROGEN: 8 mg/dL — ABNORMAL LOW (ref 9–23)
BUN / CREAT RATIO: 14
CALCIUM: 8.4 mg/dL — ABNORMAL LOW (ref 8.7–10.4)
CHLORIDE: 103 mmol/L (ref 98–107)
CO2: 24.1 mmol/L (ref 20.0–31.0)
CREATININE: 0.58 mg/dL — ABNORMAL LOW
EGFR CKD-EPI AA MALE: >90 mL/min/{1.73_m2} (ref >=60)
EGFR CKD-EPI NON-AA MALE: >90 mL/min/{1.73_m2} (ref >=60)
GLUCOSE RANDOM: 95 mg/dL (ref 70–179)
POTASSIUM: 3.9 mmol/L (ref 3.4–4.8)
SODIUM: 134 mmol/L — ABNORMAL LOW (ref 135–145)

## 2020-05-22 LAB — MAGNESIUM: MAGNESIUM: 1.7 mg/dL (ref 1.6–2.6)

## 2020-05-22 MED ADMIN — potassium chloride (KLOR-CON) CR tablet 40 mEq: 40 meq | ORAL | @ 12:00:00 | Stop: 2020-05-22

## 2020-05-22 MED ADMIN — gabapentin (NEURONTIN) capsule 200 mg: 200 mg | ORAL | @ 12:00:00

## 2020-05-22 MED ADMIN — divalproex ER (DEPAKOTE ER) extended release 24 hr tablet 750 mg: 750 mg | ORAL | @ 01:00:00

## 2020-05-22 MED ADMIN — sodium chloride tablet 1 g: 1 g | ORAL | @ 17:00:00

## 2020-05-22 MED ADMIN — magnesium sulfate 2gm/50mL IVPB: 2 g | INTRAVENOUS | @ 12:00:00 | Stop: 2020-05-22

## 2020-05-22 MED ADMIN — OLANZapine (ZyPREXA) tablet 20 mg: 20 mg | ORAL | @ 01:00:00

## 2020-05-22 MED ADMIN — thiamine mononitrate (vit B1) tablet 200 mg: 200 mg | ORAL | @ 01:00:00 | Stop: 2020-05-22

## 2020-05-22 MED ADMIN — sodium chloride tablet 1 g: 1 g | ORAL | @ 01:00:00

## 2020-05-22 MED ADMIN — dilTIAZem (CARDIZEM) tablet 90 mg: 90 mg | ORAL | @ 22:00:00

## 2020-05-22 MED ADMIN — pantoprazole (PROTONIX) EC tablet 40 mg: 40 mg | ORAL | @ 12:00:00

## 2020-05-22 MED ADMIN — enoxaparin (LOVENOX) syringe 40 mg: 40 mg | SUBCUTANEOUS | @ 12:00:00

## 2020-05-22 MED ADMIN — thiamine mononitrate (vit B1) tablet 200 mg: 200 mg | ORAL | @ 10:00:00 | Stop: 2020-05-22

## 2020-05-22 MED ADMIN — dilTIAZem (CARDIZEM) tablet 30 mg: 30 mg | ORAL | @ 12:00:00 | Stop: 2020-05-22

## 2020-05-22 MED ADMIN — fidaxomicin (DIFICID) tablet 200 mg: 200 mg | ORAL | @ 12:00:00 | Stop: 2020-05-31

## 2020-05-22 MED ADMIN — sodium chloride tablet 1 g: 1 g | ORAL | @ 12:00:00

## 2020-05-22 MED ADMIN — nicotine (NICODERM CQ) 21 mg/24 hr patch 1 patch: 1 | TRANSDERMAL | @ 12:00:00

## 2020-05-22 MED ADMIN — thiamine mononitrate (vit B1) tablet 200 mg: 200 mg | ORAL | @ 17:00:00

## 2020-05-22 MED ADMIN — dilTIAZem (CARDIZEM) tablet 60 mg: 60 mg | ORAL | @ 11:00:00 | Stop: 2020-05-22

## 2020-05-22 MED ADMIN — fidaxomicin (DIFICID) tablet 200 mg: 200 mg | ORAL | @ 01:00:00 | Stop: 2020-05-31

## 2020-05-22 MED ADMIN — gabapentin (NEURONTIN) capsule 200 mg: 200 mg | ORAL | @ 01:00:00

## 2020-05-22 MED ADMIN — dilTIAZem (CARDIZEM) tablet 90 mg: 90 mg | ORAL | @ 16:00:00

## 2020-05-22 MED ADMIN — dilTIAZem (CARDIZEM) tablet 60 mg: 60 mg | ORAL | @ 04:00:00 | Stop: 2020-05-22

## 2020-05-22 MED ADMIN — folic acid (FOLVITE) tablet 1 mg: 1 mg | ORAL | @ 12:00:00 | Stop: 2020-05-22

## 2020-05-22 NOTE — Unmapped (Addendum)
No acute events overnight. Afebrile.  VSS.  CIWA scores 4 d/t tremor. No other withdrawal symptoms.     Neuro:  Pt is A&Ox4. Moves all limbs independently.  Weak-moderate strength.  Tremors.     Cardiac:  Afib. HR 90s-1 teens     Resp:  RA     Heme: Subcutaneous Lovenox for VTE prophylaxis (see MAR).     GI: No BMs overnight. Enteric precautions for confirmed c-diff.      GU: Pt voids in urinal.  UOP adequate.     IV/Skin/Pain:  1 PIV in LUE.  Skin intact. Pt turns independently.  Pt reports no pain.     Alarms on and audible. Bed locked in lowest position.  Pt remained free of falls.    Problem: Adult Inpatient Plan of Care  Goal: Plan of Care Review  Outcome: Progressing  Goal: Patient-Specific Goal (Individualized)  Outcome: Progressing  Goal: Absence of Hospital-Acquired Illness or Injury  Outcome: Progressing  Intervention: Identify and Manage Fall Risk  Recent Flowsheet Documentation  Taken 05/21/2020 2000 by [name] Nottingham, RN  Safety Interventions:  ??? environmental modification  ??? fall reduction program maintained  ??? commode/urinal/bedpan at bedside  ??? bed alarm  ??? infection management  ??? isolation precautions  ??? lighting adjusted for tasks/safety  ??? low bed  ??? nonskid shoes/slippers when out of bed  ??? room near unit station  ??? supervised activity  Intervention: Prevent and Manage VTE (Venous Thromboembolism) Risk  Recent Flowsheet Documentation  Taken 05/21/2020 2000 by [name] Nottingham, RN  Activity Management: activity adjusted per tolerance  Intervention: Prevent Infection  Recent Flowsheet Documentation  Taken 05/21/2020 2000 by [name] Nottingham, RN  Infection Prevention:  ??? environmental surveillance performed  ??? equipment surfaces disinfected  ??? hand hygiene promoted  ??? personal protective equipment utilized  ??? rest/sleep promoted  ??? single patient room provided  Goal: Optimal Comfort and Wellbeing  Outcome: Progressing  Goal: Readiness for Transition of Care  Outcome: Progressing  Goal: Rounds/Family Conference  Outcome: Progressing     Problem: Infection  Goal: Absence of Infection Signs and Symptoms  Outcome: Progressing  Intervention: Prevent or Manage Infection  Recent Flowsheet Documentation  Taken 05/21/2020 2000 by [name] Nottingham, RN  Infection Management: aseptic technique maintained  Isolation Precautions: enteric precautions maintained     Problem: Skin Injury Risk Increased  Goal: Skin Health and Integrity  Outcome: Progressing  Intervention: Optimize Skin Protection  Recent Flowsheet Documentation  Taken 05/22/2020 0000 by [name] Nottingham, RN  Pressure Reduction Techniques: frequent weight shift encouraged  Taken 05/21/2020 2200 by [name] Nottingham, RN  Pressure Reduction Techniques: frequent weight shift encouraged  Taken 05/21/2020 2000 by [name] Nottingham, RN  Pressure Reduction Techniques: frequent weight shift encouraged  Pressure Reduction Devices: pressure-redistributing mattress utilized     Problem: Fall Injury Risk  Goal: Absence of Fall and Fall-Related Injury  Outcome: Progressing  Intervention: Promote Scientist, clinical (histocompatibility and immunogenetics) Documentation  Taken 05/21/2020 2000 by [name] Nottingham, RN  Safety Interventions:  ??? environmental modification  ??? fall reduction program maintained  ??? commode/urinal/bedpan at bedside  ??? bed alarm  ??? infection management  ??? isolation precautions  ??? lighting adjusted for tasks/safety  ??? low bed  ??? nonskid shoes/slippers when out of bed  ??? room near unit station  ??? supervised activity     Problem: Self-Care Deficit  Goal: Improved Ability to Complete Activities of Daily Living  Outcome: Progressing  Problem: Dysrhythmia  Goal: Normalized Cardiac Rhythm  Outcome: Progressing     Problem: Behavioral Health Comorbidity  Goal: Maintenance of Behavioral Health Symptom Control  Outcome: Progressing     Problem: Hypertension Comorbidity  Goal: Blood Pressure in Desired Range  Outcome: Progressing     Problem: Obstructive Sleep Apnea Risk or Actual Comorbidity Management  Goal: Unobstructed Breathing During Sleep  Outcome: Progressing     Problem: Pain Chronic (Persistent) (Comorbidity Management)  Goal: Acceptable Pain Control and Functional Ability  Outcome: Progressing  Intervention: Manage Persistent Pain  Recent Flowsheet Documentation  Taken 05/21/2020 2000 by [name] Nottingham, RN  Sleep/Rest Enhancement:  ??? awakenings minimized  ??? room darkened     Problem: Seizure Disorder Comorbidity  Goal: Maintenance of Seizure Control  Outcome: Progressing     Problem: Alcohol Withdrawal  Goal: Alcohol Withdrawal Symptom Control  Outcome: Progressing     Problem: Acute Neurologic Deterioration (Alcohol Withdrawal)  Goal: Optimal Neurologic Function  Outcome: Progressing     Problem: Substance Misuse (Alcohol Withdrawal)  Goal: Readiness for Change Identified  Outcome: Progressing

## 2020-05-22 NOTE — Unmapped (Signed)
Pt remains stepdown status.  Pleasant and cooperative.  CIWA scores q 4 hours and so far less than 8.   Pt ambulatory with use of walker and contact guard assist this am from bed to bathroom.  Gait unsteady and wobbly.  Pt using call bell appropriately when he needs assistance and verbalized understanding of instruction to call for assistance with ambulating to reduce risk of fall.    Problem: Adult Inpatient Plan of Care  Goal: Plan of Care Review  Outcome: Progressing  Goal: Patient-Specific Goal (Individualized)  Outcome: Progressing  Goal: Absence of Hospital-Acquired Illness or Injury  Outcome: Progressing  Intervention: Prevent and Manage VTE (Venous Thromboembolism) Risk  Recent Flowsheet Documentation  Taken 05/22/2020 1100 by March Rummage, RN  Activity Management: ambulated to bathroom  Taken 05/22/2020 1000 by March Rummage, RN  Activity Management: bedrest  Taken 05/22/2020 0800 by March Rummage, RN  Activity Management: bedrest  Goal: Optimal Comfort and Wellbeing  Outcome: Progressing  Goal: Readiness for Transition of Care  Outcome: Progressing  Goal: Rounds/Family Conference  Outcome: Progressing     Problem: Infection  Goal: Absence of Infection Signs and Symptoms  Outcome: Progressing  Intervention: Prevent or Manage Infection  Recent Flowsheet Documentation  Taken 05/22/2020 0800 by March Rummage, RN  Isolation Precautions: enteric precautions maintained     Problem: Skin Injury Risk Increased  Goal: Skin Health and Integrity  Outcome: Progressing  Intervention: Optimize Skin Protection  Recent Flowsheet Documentation  Taken 05/22/2020 1200 by March Rummage, RN  Pressure Reduction Techniques:   heels elevated off bed   frequent weight shift encouraged  Taken 05/22/2020 0800 by March Rummage, RN  Pressure Reduction Techniques:   frequent weight shift encouraged   heels elevated off bed  Head of Bed (HOB) Positioning: HOB at 20-30 degrees     Problem: Fall Injury Risk  Goal: Absence of Fall and Fall-Related Injury  Outcome: Progressing     Problem: Self-Care Deficit  Goal: Improved Ability to Complete Activities of Daily Living  Outcome: Progressing     Problem: Dysrhythmia  Goal: Normalized Cardiac Rhythm  Outcome: Progressing     Problem: Behavioral Health Comorbidity  Goal: Maintenance of Behavioral Health Symptom Control  Outcome: Progressing     Problem: Hypertension Comorbidity  Goal: Blood Pressure in Desired Range  Outcome: Progressing     Problem: Obstructive Sleep Apnea Risk or Actual Comorbidity Management  Goal: Unobstructed Breathing During Sleep  Outcome: Progressing     Problem: Pain Chronic (Persistent) (Comorbidity Management)  Goal: Acceptable Pain Control and Functional Ability  Outcome: Progressing     Problem: Seizure Disorder Comorbidity  Goal: Maintenance of Seizure Control  Outcome: Progressing     Problem: Alcohol Withdrawal  Goal: Alcohol Withdrawal Symptom Control  Outcome: Progressing     Problem: Acute Neurologic Deterioration (Alcohol Withdrawal)  Goal: Optimal Neurologic Function  Outcome: Progressing     Problem: Substance Misuse (Alcohol Withdrawal)  Goal: Readiness for Change Identified  Outcome: Progressing

## 2020-05-22 NOTE — Unmapped (Signed)
Family Medicine Inpatient Service Progress Note    Team: [facility] (pgr [phone])    Hospital Day: 3    ASSESSMENT / PLAN:   Nicholas Rush is a 55 y.o. male with a past medical history significant for HTN, bipolar I disorder, alcohol use disorder c/b withdrawal seizures, recurrent c-diff who presented to the ED for alcohol withdrawal, and found to be in atrial fibrillation with RVR.    # Atrial Fibrillation:   Patient currently tachycardic in A. Fib with irregular rhythm. Rate is more controlled < 100  Etiology for afib at this time includes c. diff infection, alcohol withdrawal, subclinical hyperthyroidism (less likely given absence of typical exam findings), cardiac (valvular disease, HF).  - increase diltiazem to 90mg  q6h   - thyrotropin receptor antibodies pending for Grave's Disease workup  - C. Diff positive, treating as below  - K 3.9, repleted and Mg 1.7, repleted (goal Mg > 2, K > 4)  - echo tomorrow  - consider repeat lipids, A1C for cardiac risk factor workup (both last performed in 02/2020)  ??  # Alcohol Withdrawal - Alcohol Use Disorder:   Patient reports drinking 3-4 bourbon shots per day, last drink at 6pm 05/19/20. He has withdrawn multiple times with complications including seizure with last withdrawal. Withdrawal symptoms have been very mild thus far, with CIWA of 4 x 4 times in past 24h. Highest CIWA score this admission has been 8 (on 5/2 at 4am).  - continue CIWAs w/ symptom triggered Ativan, consider discontinuing tomorrow if stable   - start multivitamin  - zofran PRN  - melatonin at bedtime  - baclofen PRN  - continue gabapentin 200mg  BID  ??  # C. 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 No indication for depakote level at this time.   ??  #HTN:   Patient mildly hypertensive this AM  -Stopped amlodipine given softer pressures and currently taking diltiazem   -will monitor  ??  # Tobacco use disorder:   Patient currently smoking 1PPD.   - Nicotine replacement therapy with patch  - tobacco cessation team consulted  ??  # Checklist:  - IVF: none  - Tubes/Lines/Drains: peripheral IV  - Diet Regular  - Bowel Regimen: No indication for a bowel regimen at this time, active diarrhea  - DVT: SQ Lovenox  - Code Status:   Orders Placed This Encounter   Procedures   ??? Full Code     Standing Status:   Standing     Number of Occurrences:   1     - Dispo: Step down    [ ]  Anticipated Discharge Location: SNF   [ ]  PT/OT/DME: PT/OT both 5xL  [ ]  CM/SW needs: Substance abuse resources, SNF arrangement  [ ]  Meds/Rx:  Not yet prescribed. Needs Pharmacy assistance (fidaxomicin)  [ ]  Teaching: None anticipated  [ ]  Follow up appt: Appointment needed  [ ]  Excuse letter: None anticipated  [ ]  Transport: Ambulance Needed    SUBJECTIVE:  Interval events: patient tachy to 130s but decreased without additional medication    Patient reports that he is feeling pretty good. Denies chest pain, shortness of breath, nausea/vomiting, diarrhea, headaches, dizziness, tremors, abdominal pain.  Urinating normally. Eating well.    Tachy to 140s, given additional 30mg  diltiazem this AM and scheduled dose increased to 90mg .     REVIEW OF SYSTEMS:  Pertinent positives and negatives per HPI. A complete review of systems otherwise negative.    PHYSICAL EXAM:      Intake/Output Summary (Last 24 hours) at 05/22/2020 1549  Last data filed at 05/22/2020 1200  Gross per 24 hour   Intake 500 ml   Output 2200 ml   Net -1700 ml     CIWA for the past 24 hrs:   CIWA-Ar Total   05/22/20 1527 5   05/22/20 1145 5   05/22/20 0717 5   05/22/20 0400 4   05/22/20 0000 4   05/21/20 2015 4   05/21/20 1600 4       Recent Vitals:  Vitals:    05/22/20 0825 05/22/20 1145 05/22/20 1200 05/22/20 1527   BP: 118/88 104/78 100/69 124/78   Pulse:  94 82 115   Resp:  16 16 16    Temp:  36.4 ??C  36.6 ??C   TempSrc:  Axillary  Oral   SpO2:   99% 98%   Weight:       Height:         GEN: intervally more well-appearing, lying in bed, NAD  HEENT: NCAT, No scleral icterus. Conjunctiva non-erythematous. MMM.  CV: Tachycardic w/ irregular rhythm. No murmurs/rubs/gallops.  Pulm: Normal work of breathing on room air. No ronchi, wheezing, crackles.  Abd: Flat.  Nontender. No guarding, rebound.  Normoactive bowel sounds.    Neuro: A&O x 3. No focal deficits.  Ext: Trace pitting peripheral edema on left, no edema on right. Palpable distal pulses. Mild tremor on extension of hands  Skin: No rashes or skin lesions.     LABS/ STUDIES:    All imaging, laboratory studies, and other pertinent tests including electrocardiography within the last 24 hours were reviewed and are summarized within the assessment and plan.     NUTRITION:                         [name], MS4     I attest that I have reviewed the student note and that the components of the history of the present illness, the physical exam, and the assessment and plan documented were performed by me or were performed in my presence by the student where I verified the documentation and performed (or re-performed) the exam and medical decision making.     Lyndal Alamillo Kirby Crigler, MD

## 2020-05-23 LAB — CBC
HEMATOCRIT: 37.3 % — ABNORMAL LOW (ref 39.0–48.0)
HEMOGLOBIN: 13.1 g/dL (ref 12.9–16.5)
MEAN CORPUSCULAR HEMOGLOBIN CONC: 35 g/dL (ref 32.0–36.0)
MEAN CORPUSCULAR HEMOGLOBIN: 36.7 pg — ABNORMAL HIGH (ref 25.9–32.4)
MEAN CORPUSCULAR VOLUME: 104.8 fL — ABNORMAL HIGH (ref 77.6–95.7)
MEAN PLATELET VOLUME: 8.3 fL (ref 6.8–10.7)
PLATELET COUNT: 128 10*9/L — ABNORMAL LOW (ref 150–450)
RED BLOOD CELL COUNT: 3.56 10*12/L — ABNORMAL LOW (ref 4.26–5.60)
RED CELL DISTRIBUTION WIDTH: 17 % — ABNORMAL HIGH (ref 12.2–15.2)
WBC ADJUSTED: 5 10*9/L (ref 3.6–11.2)

## 2020-05-23 LAB — BASIC METABOLIC PANEL
ANION GAP: 4 mmol/L — ABNORMAL LOW (ref 5–14)
BLOOD UREA NITROGEN: 13 mg/dL (ref 9–23)
BUN / CREAT RATIO: 19
CALCIUM: 8.1 mg/dL — ABNORMAL LOW (ref 8.7–10.4)
CHLORIDE: 103 mmol/L (ref 98–107)
CO2: 26.7 mmol/L (ref 20.0–31.0)
CREATININE: 0.67 mg/dL
EGFR CKD-EPI AA MALE: >90 mL/min/{1.73_m2} (ref >=60)
EGFR CKD-EPI NON-AA MALE: >90 mL/min/{1.73_m2} (ref >=60)
GLUCOSE RANDOM: 89 mg/dL (ref 70–179)
POTASSIUM: 4.3 mmol/L (ref 3.4–4.8)
SODIUM: 134 mmol/L — ABNORMAL LOW (ref 135–145)

## 2020-05-23 LAB — MAGNESIUM: MAGNESIUM: 1.7 mg/dL (ref 1.6–2.6)

## 2020-05-23 MED ADMIN — magnesium sulfate 2gm/50mL IVPB: 2 g | INTRAVENOUS | @ 13:00:00 | Stop: 2020-05-23

## 2020-05-23 MED ADMIN — dilTIAZem (CARDIZEM CD) 24 hr capsule 360 mg: 360 mg | ORAL | @ 23:00:00

## 2020-05-23 MED ADMIN — pantoprazole (PROTONIX) EC tablet 40 mg: 40 mg | ORAL | @ 14:00:00

## 2020-05-23 MED ADMIN — divalproex ER (DEPAKOTE ER) extended release 24 hr tablet 750 mg: 750 mg | ORAL | @ 01:00:00

## 2020-05-23 MED ADMIN — gabapentin (NEURONTIN) capsule 200 mg: 200 mg | ORAL | @ 01:00:00

## 2020-05-23 MED ADMIN — enoxaparin (LOVENOX) syringe 40 mg: 40 mg | SUBCUTANEOUS | @ 13:00:00

## 2020-05-23 MED ADMIN — dilTIAZem (CARDIZEM) tablet 90 mg: 90 mg | ORAL | @ 16:00:00 | Stop: 2020-05-23

## 2020-05-23 MED ADMIN — sodium chloride tablet 1 g: 1 g | ORAL | @ 14:00:00

## 2020-05-23 MED ADMIN — gabapentin (NEURONTIN) capsule 200 mg: 200 mg | ORAL | @ 14:00:00

## 2020-05-23 MED ADMIN — fidaxomicin (DIFICID) tablet 200 mg: 200 mg | ORAL | @ 01:00:00 | Stop: 2020-05-31

## 2020-05-23 MED ADMIN — dilTIAZem (CARDIZEM) tablet 90 mg: 90 mg | ORAL | @ 04:00:00 | Stop: 2020-05-23

## 2020-05-23 MED ADMIN — thiamine mononitrate (vit B1) tablet 200 mg: 200 mg | ORAL | @ 14:00:00

## 2020-05-23 MED ADMIN — melatonin tablet 3 mg: 3 mg | ORAL | @ 23:00:00

## 2020-05-23 MED ADMIN — multivitamins, therapeutic with minerals tablet 1 tablet: 1 | ORAL | @ 14:00:00

## 2020-05-23 MED ADMIN — nicotine (NICODERM CQ) 21 mg/24 hr patch 1 patch: 1 | TRANSDERMAL | @ 13:00:00

## 2020-05-23 MED ADMIN — fidaxomicin (DIFICID) tablet 200 mg: 200 mg | ORAL | @ 14:00:00 | Stop: 2020-05-31

## 2020-05-23 MED ADMIN — dilTIAZem (CARDIZEM) tablet 90 mg: 90 mg | ORAL | @ 10:00:00 | Stop: 2020-05-23

## 2020-05-23 MED ADMIN — OLANZapine (ZyPREXA) tablet 20 mg: 20 mg | ORAL | @ 01:00:00

## 2020-05-23 MED ADMIN — sodium chloride tablet 1 g: 1 g | ORAL | @ 01:00:00

## 2020-05-23 MED ADMIN — sodium chloride tablet 1 g: 1 g | ORAL | @ 18:00:00

## 2020-05-23 NOTE — Unmapped (Signed)
Family Medicine Inpatient Service Progress Note    Team: [facility] (pgr [phone])    Hospital Day: 4    ASSESSMENT / PLAN:   Nicholas Rush is a 55 y.o. male with a past medical history significant for HTN, bipolar I disorder, alcohol use disorder c/b withdrawal seizures, recurrent c-diff who presented to the ED for alcohol withdrawal, and found to be in atrial fibrillation with RVR.    # Atrial Fibrillation:   Patient currently in normal sinus rhythm. Rate is controlled < 100. Thyrotropin receptor antibodies negative at 1.10. Echo non-concerning.   A. Fib most likely due to infection and alcohol withdrawal.   - Start diltiazem ER 360mg  daily this evening to replace 60mg  PO q6h   - C. Diff positive, treating as below  - K 4.3 and Mg 1.7, repleted (goal Mg > 2, K > 4)  - consider repeat lipids, A1C for cardiac risk factor workup (both last performed in 02/2020)  ??  # Alcohol Withdrawal - Alcohol Use Disorder:   Patient reported drinking 3-4 bourbon shots per day, last drink at 6pm 05/19/20. He has withdrawn multiple times with complications including seizure with last withdrawal. Withdrawal symptoms have been mild.   - discontinued CIWAs and symptom-triggered Ativan on 5/5  - continue multivitamin  - continue thiamine  - zofran PRN  - melatonin at bedtime  - discontinue baclofen PRN (not being used)  - continue gabapentin 200mg  BID  - PT/OT   ??  # C. difficile infection  History of recurrent infections- last in May 2021  Diarrhea has resolved  - C-diff positive  - Continue oral antibiotics-fidaxomicin 200mg  BID x 10days (through 3/12)  - patient most likely must complete antibiotic course/be off enteric precautions to transfer to SNF  - evaluate tomorrow for need for bowel regimen    # Bipolar I Disorder  well-controlled on Depakote and Zyprexa  - continue depakote 750mg  at bedtime and zyprexa 20mg  at bedtime. No indication for depakote level at this time.   ??  #HTN:   Patient mildly hypertensive this AM  -diltiazem being used for rate control; if needs additional BP control will need alternative agent than home amlodipine  -will monitor  ??  # Tobacco use disorder:   Patient currently smoking 1PPD.   - Nicotine replacement therapy with patch  - tobacco cessation team consult complete  ??  # Checklist:  - IVF: none  - Tubes/Lines/Drains: peripheral IV  - Diet Regular  - Bowel Regimen: No indication for a bowel regimen at this time  - DVT: SQ Lovenox  - Code Status:   Orders Placed This Encounter   Procedures   ??? Full Code     Standing Status:   Standing     Number of Occurrences:   1     - Dispo: floor    [ ]  Anticipated Discharge Location: SNF   [ ]  PT/OT/DME: PT/OT both 5xL  [ ]  CM/SW needs: Substance abuse resources, SNF arrangement  [ ]  Meds/Rx:  Not yet prescribed. Needs Pharmacy assistance (fidaxomicin)  [ ]  Teaching: None anticipated  [ ]  Follow up appt: Appointment needed  [ ]  Excuse letter: None anticipated  [ ]  Transport: Ambulance Needed    SUBJECTIVE:  Interval events: patient in sinus rhythm overnight with rate controlled    Patient reports that he is feeling pretty good. Denies chest pain, shortness of breath, nausea/vomiting, diarrhea, headaches, dizziness, tremors, abdominal pain. Urinating normally. Eating well. Has  not stooled since 5/3.    REVIEW OF SYSTEMS:  Pertinent positives and negatives per HPI. A complete review of systems otherwise negative.    PHYSICAL EXAM:      Intake/Output Summary (Last 24 hours) at 05/23/2020 1400  Last data filed at 05/23/2020 1200  Gross per 24 hour   Intake 650 ml   Output 1025 ml   Net -375 ml     CIWA for the past 24 hrs:   CIWA-Ar Total   05/23/20 0400 4   05/23/20 0000 4   05/22/20 2006 4   05/22/20 1527 5       Recent Vitals:  Vitals:    05/23/20 0400 05/23/20 0602 05/23/20 0800 05/23/20 1125   BP: 127/79 136/86 115/69 113/70   Pulse: 71  68 74   Resp: 17  17 18    Temp: 36.7 ??C  37 ??C 36.8 ??C   TempSrc: Oral  Oral Oral   SpO2: 99%  94% 96%   Weight: Height:         GEN: intervally more well-appearing, lying in bed, NAD  HEENT: NCAT, No scleral icterus. Conjunctiva non-erythematous. MMM.  CV: Normal rate and rhythm. No murmurs/rubs/gallops.  Pulm: Normal work of breathing on room air. No rhonchi, wheezing, crackles.  Abd: Flat.  Nontender. No guarding, rebound.  Normoactive bowel sounds.    Neuro: A&O x 3. No focal deficits.  Ext: Trace pitting peripheral edema on left, no edema on right. Palpable distal pulses. Mild tremor on extension of hands  Skin: No rashes or skin lesions.     LABS/ STUDIES:    All imaging, laboratory studies, and other pertinent tests including electrocardiography within the last 24 hours were reviewed and are summarized within the assessment and plan.     NUTRITION:  Malnutrition Evaluation as performed by RD, LDN: Patient does not meet AND/ASPEN criteria for malnutrition at this time (05/23/20 0848)                      Nicholas Rush, MS4     I attest that I have reviewed the student note and that the components of the history of the present illness, the physical exam, and the assessment and plan documented were performed by me or were performed in my presence by the student where I verified the documentation and performed (or re-performed) the exam and medical decision making.     [name], MD

## 2020-05-23 NOTE — Unmapped (Signed)
Pt now floor status. Remains hospitalized pending disposition planning.    He is aaox4, a bit unsteady on feet. Mild tremor bilat hands. VSS on room air. Afebrile and no complaints of pain. Voiding, good urine output. No bm this shift.     Plan: report called to floor nurse - transfer out. Discharge pending disposition planning / snf need.       Problem: Adult Inpatient Plan of Care  Goal: Plan of Care Review  Outcome: Progressing  Goal: Patient-Specific Goal (Individualized)  Outcome: Progressing  Goal: Absence of Hospital-Acquired Illness or Injury  Outcome: Progressing  Intervention: Identify and Manage Fall Risk  Recent Flowsheet Documentation  Taken 05/23/2020 0800 by Sherrian Divers, RN  Safety Interventions:   aspiration precautions   bleeding precautions   commode/urinal/bedpan at bedside   fall reduction program maintained   isolation precautions   infection management   lighting adjusted for tasks/safety   nonskid shoes/slippers when out of bed   room near unit station  Intervention: Prevent Skin Injury  Recent Flowsheet Documentation  Taken 05/23/2020 0800 by Sherrian Divers, RN  Skin Protection:   cleansing with dimethicone incontinence wipes   tubing/devices free from skin contact  Intervention: Prevent and Manage VTE (Venous Thromboembolism) Risk  Recent Flowsheet Documentation  Taken 05/23/2020 1200 by Sherrian Divers, RN  Activity Management: up in chair  Intervention: Prevent Infection  Recent Flowsheet Documentation  Taken 05/23/2020 0800 by Sherrian Divers, RN  Infection Prevention:   visitors restricted/screened   single patient room provided   rest/sleep promoted   personal protective equipment utilized   hand hygiene promoted   equipment surfaces disinfected  Goal: Optimal Comfort and Wellbeing  Outcome: Progressing  Goal: Readiness for Transition of Care  Outcome: Progressing  Goal: Rounds/Family Conference  Outcome: Progressing     Problem: Infection  Goal: Absence of Infection Signs and Symptoms  Outcome: Resolved  Intervention: Prevent or Manage Infection  Recent Flowsheet Documentation  Taken 05/23/2020 0800 by Sherrian Divers, RN  Infection Management: aseptic technique maintained  Isolation Precautions: enteric precautions maintained     Problem: Skin Injury Risk Increased  Goal: Skin Health and Integrity  Outcome: Resolved  Intervention: Optimize Skin Protection  Recent Flowsheet Documentation  Taken 05/23/2020 0800 by Sherrian Divers, RN  Pressure Reduction Techniques:   frequent weight shift encouraged   weight shift assistance provided  Head of Bed (HOB) Positioning: HOB at 30 degrees  Pressure Reduction Devices:   pressure-redistributing mattress utilized   positioning supports utilized  Skin Protection:   cleansing with dimethicone incontinence wipes   tubing/devices free from skin contact     Problem: Fall Injury Risk  Goal: Absence of Fall and Fall-Related Injury  Outcome: Progressing  Intervention: Promote Scientist, clinical (histocompatibility and immunogenetics) Documentation  Taken 05/23/2020 0800 by Sherrian Divers, RN  Safety Interventions:   aspiration precautions   bleeding precautions   commode/urinal/bedpan at bedside   fall reduction program maintained   isolation precautions   infection management   lighting adjusted for tasks/safety   nonskid shoes/slippers when out of bed   room near unit station     Problem: Self-Care Deficit  Goal: Improved Ability to Complete Activities of Daily Living  Outcome: Progressing     Problem: Dysrhythmia  Goal: Normalized Cardiac Rhythm  Outcome: Resolved     Problem: Behavioral Health Comorbidity  Goal: Maintenance of Behavioral Health Symptom Control  Outcome: Resolved     Problem: Hypertension Comorbidity  Goal: Blood Pressure in Desired Range  Outcome: Progressing     Problem: Obstructive Sleep Apnea Risk or Actual Comorbidity Management  Goal: Unobstructed Breathing During Sleep  Outcome: Resolved     Problem: Pain Chronic (Persistent) (Comorbidity Management)  Goal: Acceptable Pain Control and Functional Ability  Outcome: Resolved  Intervention: Manage Persistent Pain  Recent Flowsheet Documentation  Taken 05/23/2020 0800 by Sherrian Divers, RN  Sleep/Rest Enhancement:   awakenings minimized   noise level reduced   regular sleep/rest pattern promoted     Problem: Seizure Disorder Comorbidity  Goal: Maintenance of Seizure Control  Outcome: Progressing     Problem: Alcohol Withdrawal  Goal: Alcohol Withdrawal Symptom Control  Outcome: Resolved  Intervention: Minimize or Manage Alcohol Withdrawal Symptoms  Recent Flowsheet Documentation  Taken 05/23/2020 0800 by Sherrian Divers, RN  Aspiration Precautions:   awake/alert before oral intake   upright posture maintained     Problem: Acute Neurologic Deterioration (Alcohol Withdrawal)  Goal: Optimal Neurologic Function  Outcome: Resolved     Problem: Substance Misuse (Alcohol Withdrawal)  Goal: Readiness for Change Identified  Outcome: Resolved

## 2020-05-23 NOTE — Unmapped (Signed)
Adult Nutrition Assessment Note    Visit Type: RN Consult  Reason for Visit: Per Admission Nutrition Screen (Adult), Have you had a decrease in food intake or appetite?      HPI & PMH:  Per MD note, patient is a 55 y.o. male with a past medical history significant for HTN, bipolar I disorder, alcohol use disorder c/b withdrawal seizures, recurrent c-diff who presents to the ED for alcohol withdrawal, and found to be in atrial fibrillation with RVR.    Anthropometric Data:  Height: 182.9 cm (6')   Admission weight: 89.4 kg (197 lb)  Last recorded weight: 89.6 kg (197 lb 8.5 oz)  IBW: 80.79 kg  Percent IBW: 110.61 %  BMI: Body mass index is 26.79 kg/m??.   Usual Body Weight: 200# - per patient, does not recall the last time he weighed this    Weight history prior to admission: no significant weight loss noted   Wt Readings from Last 10 Encounters:   05/20/20 89.6 kg (197 lb 8.5 oz)   03/25/20 89.6 kg (197 lb 8 oz)   03/19/20 89.4 kg (197 lb 1.5 oz)   03/09/20 90.4 kg (199 lb 3 oz)   02/23/20 83.9 kg (184 lb 15.5 oz)   01/17/20 83.9 kg (185 lb)   01/15/20 86.6 kg (191 lb)   12/13/19 84.7 kg (186 lb 11.2 oz)   12/11/19 85 kg (187 lb 6.4 oz)   11/30/19 81.3 kg (179 lb 3.7 oz)        Weight changes this admission:   Last 5 Recorded Weights    05/19/20 2029 05/20/20 0138   Weight: 89.4 kg (197 lb) 89.6 kg (197 lb 8.5 oz)        Nutrition Focused Physical Exam:  Nutrition Focused Physical Exam:                        Nutrition Evaluation  Overall Impressions: Nutrition-Focused Physical Exam not indicated due to lack of malnutrition risk factors. (05/23/20 0848)  Nutrition Designation: Overweight (BMI 25.00 - 29.99 kg/m2) (05/23/20 0848)      NUTRITIONALLY RELEVANT DATA     Medications:   Nutritionally pertinent medications reviewed and evaluated for potential food and/or medication interactions and include pantoprazole, KCl 40 meq  and sodium chloride 1g and thiamine      Labs:   Nutritionally pertinent labs reviewed and include Na: 134 mmol/L    Nutrition History:   May 23, 2020: Prior to admission: RD spoke with patient.  Patient denies any recent changes in appetite/intake.  Patient states that he's just not a breakfast person and typically eats 2 meals per day.  Patient reports this has not changed recently.  Patient states that he is currently ordering 3 meals per day and eating ~50%.  Documentation shows 100/75/75% of 3 meals consumed.  Patient denies N/V/D and/or chewing/swallowing problems.  Last BM: 05/03 - per RN shift assessment    Allergies, Intolerances, Sensitivities, and/or Cultural/Religious Dietary Restrictions: none identified at this time     Current Nutrition:  Oral intake        Nutrition Orders   (From admission, onward)             Start     Ordered    05/19/20 2332  Nutrition Therapy Regular/House  Effective now        Question:  Nutrition Therapy:  Answer:  Regular/House    05/19/20 2331  Nutritional Needs:   Healthy balance of carbohydrate, protein, and fat.       Malnutrition Assessment using AND/ASPEN Clinical Characteristics:    Patient does not meet AND/ASPEN criteria for malnutrition at this time (05/23/20 0848)       GOALS and EVALUATION     ??? Patient to continue to consume 75% or greater of po intake via combination of meals, snacks, and/or oral supplements during admission.  - New    Motivation, Barriers, and Compliance:  Evaluation of motivation, barriers, and compliance completed. No concerns identified at this time.     NUTRITION ASSESSMENT     ??? Current nutrition therapy is appropriate and likely meeting meeting nutritional needs at this time.    ??? Weight history does not show any significant gains/losses PTA.  ??? Current vitamin supplementation remains appropriate given PMH of alcohol abuse.      Discharge Planning:   Monitor via CAPP rounds for any discharge planning needs.      NUTRITION INTERVENTIONS and RECOMMENDATION     1. Recommend to continue current diet as ordered: Regular  2. Continue current vitamin supplementation  3. Monitor po intake and record % meals in Epic  4. Weekly weights    Follow-Up Parameters:   1-2 times per week (and more frequent as indicated)    I appreciate the opportunity to participate in the care of this patient.  Please contact me with any questions.    Terrence Dupont, MS, RD, LDN  Pager: [phone]

## 2020-05-23 NOTE — Unmapped (Signed)
Problem: Fall Injury Risk  Goal: Absence of Fall and Fall-Related Injury  Outcome: Ongoing - Unchanged  Intervention: Promote Scientist, clinical (histocompatibility and immunogenetics) Documentation  Taken 05/22/2020 2000 by [name] Nottingham, RN  Safety Interventions:   environmental modification   fall reduction program maintained   lighting adjusted for tasks/safety   low bed   commode/urinal/bedpan at bedside   supervised activity   room near unit station   nonskid shoes/slippers when out of bed     Problem: Adult Inpatient Plan of Care  Goal: Plan of Care Review  Outcome: Progressing  Goal: Patient-Specific Goal (Individualized)  Outcome: Progressing  Goal: Absence of Hospital-Acquired Illness or Injury  Outcome: Progressing  Intervention: Identify and Manage Fall Risk  Recent Flowsheet Documentation  Taken 05/22/2020 2000 by [name] Nottingham, RN  Safety Interventions:   environmental modification   fall reduction program maintained   lighting adjusted for tasks/safety   low bed   commode/urinal/bedpan at bedside   supervised activity   room near unit station   nonskid shoes/slippers when out of bed  Intervention: Prevent Skin Injury  Recent Flowsheet Documentation  Taken 05/22/2020 2000 by [name] Nottingham, RN  Skin Protection:   adhesive use limited   incontinence pads utilized   transparent dressing maintained  Intervention: Prevent and Manage VTE (Venous Thromboembolism) Risk  Recent Flowsheet Documentation  Taken 05/22/2020 2000 by [name] Nottingham, RN  Activity Management: up in chair  VTE Prevention/Management: anticoagulant therapy  Intervention: Prevent Infection  Recent Flowsheet Documentation  Taken 05/22/2020 2000 by [name] Nottingham, RN  Infection Prevention:   cohorting utilized   environmental surveillance performed   equipment surfaces disinfected   hand hygiene promoted   personal protective equipment utilized   rest/sleep promoted   single patient room provided  Goal: Optimal Comfort and Wellbeing  Outcome: Progressing  Goal: Readiness for Transition of Care  Outcome: Progressing  Goal: Rounds/Family Conference  Outcome: Progressing     Problem: Infection  Goal: Absence of Infection Signs and Symptoms  Outcome: Progressing  Intervention: Prevent or Manage Infection  Recent Flowsheet Documentation  Taken 05/22/2020 2000 by [name] Nottingham, RN  Infection Management: aseptic technique maintained  Isolation Precautions: enteric precautions maintained     Problem: Skin Injury Risk Increased  Goal: Skin Health and Integrity  Outcome: Progressing  Intervention: Optimize Skin Protection  Recent Flowsheet Documentation  Taken 05/23/2020 0000 by [name] Nottingham, RN  Pressure Reduction Techniques: frequent weight shift encouraged  Taken 05/22/2020 2200 by [name] Nottingham, RN  Pressure Reduction Techniques: frequent weight shift encouraged  Taken 05/22/2020 2000 by [name] Nottingham, RN  Pressure Reduction Techniques: frequent weight shift encouraged  Pressure Reduction Devices:   heel offloading device utilized   positioning supports utilized  Skin Protection:   adhesive use limited   incontinence pads utilized   transparent dressing maintained     Problem: Self-Care Deficit  Goal: Improved Ability to Complete Activities of Daily Living  Outcome: Progressing     Problem: Dysrhythmia  Goal: Normalized Cardiac Rhythm  Outcome: Progressing  Intervention: Monitor and Manage Cardiac Rhythm Effect  Recent Flowsheet Documentation  Taken 05/22/2020 2000 by [name] Nottingham, RN  VTE Prevention/Management: anticoagulant therapy     Problem: Behavioral Health Comorbidity  Goal: Maintenance of Behavioral Health Symptom Control  Outcome: Progressing     Problem: Hypertension Comorbidity  Goal: Blood Pressure in Desired Range  Outcome: Progressing     Problem: Obstructive Sleep Apnea Risk or  Actual Comorbidity Management  Goal: Unobstructed Breathing During Sleep  Outcome: Progressing     Problem: Pain Chronic (Persistent) (Comorbidity Management)  Goal: Acceptable Pain Control and Functional Ability  Outcome: Progressing     Problem: Seizure Disorder Comorbidity  Goal: Maintenance of Seizure Control  Outcome: Progressing     Problem: Alcohol Withdrawal  Goal: Alcohol Withdrawal Symptom Control  Outcome: Progressing     Problem: Acute Neurologic Deterioration (Alcohol Withdrawal)  Goal: Optimal Neurologic Function  Outcome: Progressing     Problem: Substance Misuse (Alcohol Withdrawal)  Goal: Readiness for Change Identified  Outcome: Progressing

## 2020-05-24 LAB — CBC
HEMATOCRIT: 38.2 % — ABNORMAL LOW (ref 39.0–48.0)
HEMOGLOBIN: 13.6 g/dL (ref 12.9–16.5)
MEAN CORPUSCULAR HEMOGLOBIN CONC: 35.5 g/dL (ref 32.0–36.0)
MEAN CORPUSCULAR HEMOGLOBIN: 37.5 pg — ABNORMAL HIGH (ref 25.9–32.4)
MEAN CORPUSCULAR VOLUME: 105.5 fL — ABNORMAL HIGH (ref 77.6–95.7)
MEAN PLATELET VOLUME: 7.9 fL (ref 6.8–10.7)
PLATELET COUNT: 136 10*9/L — ABNORMAL LOW (ref 150–450)
RED BLOOD CELL COUNT: 3.62 10*12/L — ABNORMAL LOW (ref 4.26–5.60)
RED CELL DISTRIBUTION WIDTH: 16.8 % — ABNORMAL HIGH (ref 12.2–15.2)
WBC ADJUSTED: 5.1 10*9/L (ref 3.6–11.2)

## 2020-05-24 LAB — BASIC METABOLIC PANEL
ANION GAP: 9 mmol/L (ref 5–14)
BLOOD UREA NITROGEN: 11 mg/dL (ref 9–23)
BUN / CREAT RATIO: 17
CALCIUM: 8.5 mg/dL — ABNORMAL LOW (ref 8.7–10.4)
CHLORIDE: 100 mmol/L (ref 98–107)
CO2: 23.8 mmol/L (ref 20.0–31.0)
CREATININE: 0.66 mg/dL
EGFR CKD-EPI AA MALE: >90 mL/min/{1.73_m2} (ref >=60)
EGFR CKD-EPI NON-AA MALE: >90 mL/min/{1.73_m2} (ref >=60)
GLUCOSE RANDOM: 86 mg/dL (ref 70–179)
POTASSIUM: 4 mmol/L (ref 3.4–4.8)
SODIUM: 133 mmol/L — ABNORMAL LOW (ref 135–145)

## 2020-05-24 LAB — MAGNESIUM: MAGNESIUM: 1.5 mg/dL — ABNORMAL LOW (ref 1.6–2.6)

## 2020-05-24 MED ADMIN — magnesium sulfate 2gm/50mL IVPB: 2 g | INTRAVENOUS | @ 14:00:00 | Stop: 2020-05-24

## 2020-05-24 MED ADMIN — fidaxomicin (DIFICID) tablet 200 mg: 200 mg | ORAL | @ 16:00:00 | Stop: 2020-05-31

## 2020-05-24 MED ADMIN — gabapentin (NEURONTIN) capsule 200 mg: 200 mg | ORAL | @ 03:00:00

## 2020-05-24 MED ADMIN — cetirizine (ZyrTEC) tablet 10 mg: 10 mg | ORAL | @ 17:00:00

## 2020-05-24 MED ADMIN — fidaxomicin (DIFICID) tablet 200 mg: 200 mg | ORAL | @ 03:00:00 | Stop: 2020-05-31

## 2020-05-24 MED ADMIN — pantoprazole (PROTONIX) EC tablet 40 mg: 40 mg | ORAL | @ 13:00:00

## 2020-05-24 MED ADMIN — dilTIAZem (CARDIZEM CD) 24 hr capsule 360 mg: 360 mg | ORAL | @ 21:00:00

## 2020-05-24 MED ADMIN — magnesium sulfate 2gm/50mL IVPB: 2 g | INTRAVENOUS | @ 20:00:00 | Stop: 2020-05-24

## 2020-05-24 MED ADMIN — enoxaparin (LOVENOX) syringe 40 mg: 40 mg | SUBCUTANEOUS | @ 13:00:00

## 2020-05-24 MED ADMIN — sodium chloride tablet 1 g: 1 g | ORAL | @ 18:00:00

## 2020-05-24 MED ADMIN — nicotine (NICODERM CQ) 21 mg/24 hr patch 1 patch: 1 | TRANSDERMAL | @ 13:00:00

## 2020-05-24 MED ADMIN — divalproex ER (DEPAKOTE ER) extended release 24 hr tablet 750 mg: 750 mg | ORAL | @ 03:00:00

## 2020-05-24 MED ADMIN — sodium chloride tablet 1 g: 1 g | ORAL | @ 03:00:00

## 2020-05-24 MED ADMIN — thiamine mononitrate (vit B1) tablet 200 mg: 200 mg | ORAL | @ 13:00:00

## 2020-05-24 MED ADMIN — sodium chloride tablet 1 g: 1 g | ORAL | @ 13:00:00

## 2020-05-24 MED ADMIN — polyethylene glycol (MIRALAX) packet 17 g: 17 g | ORAL | @ 14:00:00 | Stop: 2020-05-24

## 2020-05-24 MED ADMIN — OLANZapine (ZyPREXA) tablet 20 mg: 20 mg | ORAL | @ 03:00:00

## 2020-05-24 MED ADMIN — multivitamins, therapeutic with minerals tablet 1 tablet: 1 | ORAL | @ 13:00:00

## 2020-05-24 MED ADMIN — gabapentin (NEURONTIN) capsule 200 mg: 200 mg | ORAL | @ 13:00:00

## 2020-05-24 NOTE — Unmapped (Signed)
Family Medicine Inpatient Service Progress Note    Team: [facility] (pgr [phone])    Hospital Day: 5    ASSESSMENT / PLAN:   Nicholas Rush is a 55 y.o. male with a past medical history significant for HTN, bipolar I disorder, alcohol use disorder c/b withdrawal seizures, recurrent c-diff who initially presented to the ED for alcohol withdrawal, found to be in atrial fibrillation with RVR and c. diff positive.    # C. difficile infection  History of recurrent infections- last in May 2021  Diarrhea has resolved  - Continue oral antibiotics-fidaxomicin 200mg  BID x 10days (through 3/12)  - patient started on miralax since no stool since 5/3    # Atrial Fibrillation:   Condition is stable. Patient currently in normal sinus rhythm with well-controlled rate.  A. Fib was most likely due to infection and alcohol use and/or withdrawal.   - Continue diltiazem ER 360mg  daily   - C. Diff positive, treating as above  - K 4.0 and Mg 1.5, repleted (goal Mg > 2, K > 4)  - consider repeat lipids, A1C for cardiac risk factor workup (both last performed in 02/2020)  ??  # Alcohol Withdrawal - Alcohol Use Disorder:   Condition stable with last drink at 6pm 05/19/20.   - continue multivitamin  - continue thiamine  - zofran PRN  - melatonin at bedtime  - continue gabapentin 200mg  BID  - PT to follow up with patient today  - discuss naltrexone (patient has recent fill history)    # Bipolar I Disorder  well-controlled on Depakote and Zyprexa  - continue depakote 750mg  at bedtime and zyprexa 20mg  at bedtime. No indication for depakote level at this time.   ??  #HTN:   Normotensive this AM   -diltiazem being used for rate control; if needs additional BP control will need alternative agent than home amlodipine  -will monitor  ??  #Tobacco use disorder:   Patient currently smoking 1PPD.   - Nicotine replacement therapy with patch  - tobacco cessation team consult complete    # Allergies  Patient with runny nose and congestion this AM  -restarted home zyrtec  -if worsening symptoms, obtain COVID test  ??  # Checklist:  - IVF: none  - Tubes/Lines/Drains: peripheral IV  - Diet Regular  - Bowel Regimen: Miralax  - DVT: SQ Lovenox  - Code Status:   Orders Placed This Encounter   Procedures   ??? Full Code     Standing Status:   Standing     Number of Occurrences:   1     - Dispo: floor    [ ]  Anticipated Discharge Location: SNF   [ ]  PT/OT/DME: PT/OT both 5xL  [ ]  CM/SW needs: Substance abuse resources, SNF arrangement  [ ]  Meds/Rx:  Not yet prescribed. Needs Pharmacy assistance (fidaxomicin)  [ ]  Teaching: None anticipated  [ ]  Follow up appt: Appointment needed  [ ]  Excuse letter: None anticipated  [ ]  Transport: Ambulance Needed    SUBJECTIVE:  Interval events: none    Patient reports that he is feeling pretty good. Does have runny nose and congestion that he attributes to allergies. Denies chest pain, shortness of breath, nausea/vomiting, diarrhea, headaches, dizziness, tremors, abdominal pain. Urinating normally. Eating well. Has not stooled since 5/3.    REVIEW OF SYSTEMS:  Pertinent positives and negatives per HPI. A complete review of systems otherwise negative.    PHYSICAL EXAM:  Intake/Output Summary (Last 24 hours) at 05/24/2020 1335  Last data filed at 05/24/2020 1016  Gross per 24 hour   Intake 238 ml   Output 1050 ml   Net -812 ml     CIWA for the past 24 hrs:   CIWA-Ar Total   05/24/20 0800 0       Recent Vitals:  Vitals:    05/24/20 1205   BP: 118/80   Pulse: 77   Resp: 16   Temp: 36.7 ??C   SpO2: 97%     GEN: intervally more well-appearing, lying in bed, NAD  HEENT: NCAT, No scleral icterus. Conjunctiva non-erythematous. MMM.  CV: Normal rate and rhythm. No murmurs/rubs/gallops.  Pulm: Normal work of breathing on room air. No rhonchi, wheezing, crackles.  Abd: Flat.  Nontender. No guarding, rebound.  Normoactive bowel sounds.    Neuro: A&O x 3. No focal deficits.  Ext: Trace pitting peripheral edema on left, no edema on right. Palpable distal pulses. Mild tremor on extension of hands  Skin: No rashes or skin lesions.     LABS/ STUDIES:    All imaging, laboratory studies, and other pertinent tests including electrocardiography within the last 24 hours were reviewed and are summarized within the assessment and plan.     NUTRITION:  Malnutrition Evaluation as performed by RD, LDN: Patient does not meet AND/ASPEN criteria for malnutrition at this time (05/23/20 0848)                      Nicholas Rush, MS4     I attest that I have reviewed the student note and that the components of the history of the present illness, the physical exam, and the assessment and plan documented were performed by me or were performed in my presence by the student where I verified the documentation and performed (or re-performed) the exam and medical decision making.     Mariajose Mow Kirby Crigler, MD

## 2020-05-24 NOTE — Unmapped (Signed)
Alert and oriented. Vss; afebrile. Denies pain. No calls from remote telemetry monitoring. Fall precautions maintained. Oob to chair for meals. Ambulates short distances with walker. All needs met. Will continue to monitor.    Problem: Adult Inpatient Plan of Care  Goal: Plan of Care Review  Outcome: Progressing  Goal: Patient-Specific Goal (Individualized)  Outcome: Progressing  Goal: Absence of Hospital-Acquired Illness or Injury  Outcome: Progressing  Intervention: Identify and Manage Fall Risk  Recent Flowsheet Documentation  Taken 05/24/2020 0400 by Sigmund Hazel, RN  Safety Interventions:   fall reduction program maintained   isolation precautions  Taken 05/24/2020 0200 by Sigmund Hazel, RN  Safety Interventions:   fall reduction program maintained   isolation precautions  Taken 05/24/2020 0000 by Sigmund Hazel, RN  Safety Interventions:   fall reduction program maintained   isolation precautions  Taken 05/23/2020 2200 by Sigmund Hazel, RN  Safety Interventions:   fall reduction program maintained   isolation precautions  Taken 05/23/2020 2000 by Sigmund Hazel, RN  Safety Interventions:   fall reduction program maintained   isolation precautions  Intervention: Prevent and Manage VTE (Venous Thromboembolism) Risk  Recent Flowsheet Documentation  Taken 05/23/2020 2000 by Sigmund Hazel, RN  Activity Management: activity adjusted per tolerance  Goal: Optimal Comfort and Wellbeing  Outcome: Progressing  Goal: Readiness for Transition of Care  Outcome: Progressing  Goal: Rounds/Family Conference  Outcome: Progressing     Problem: Fall Injury Risk  Goal: Absence of Fall and Fall-Related Injury  Outcome: Progressing  Intervention: Promote Injury-Free Environment  Recent Flowsheet Documentation  Taken 05/24/2020 0400 by Sigmund Hazel, RN  Safety Interventions:   fall reduction program maintained   isolation precautions  Taken 05/24/2020 0200 by Sigmund Hazel, RN  Safety Interventions:   fall reduction program maintained   isolation precautions  Taken 05/24/2020 0000 by Sigmund Hazel, RN  Safety Interventions:   fall reduction program maintained   isolation precautions  Taken 05/23/2020 2200 by Sigmund Hazel, RN  Safety Interventions:   fall reduction program maintained   isolation precautions  Taken 05/23/2020 2000 by Sigmund Hazel, RN  Safety Interventions:   fall reduction program maintained   isolation precautions     Problem: Self-Care Deficit  Goal: Improved Ability to Complete Activities of Daily Living  Outcome: Progressing     Problem: Hypertension Comorbidity  Goal: Blood Pressure in Desired Range  Outcome: Progressing     Problem: Seizure Disorder Comorbidity  Goal: Maintenance of Seizure Control  Outcome: Progressing

## 2020-05-25 LAB — BASIC METABOLIC PANEL
ANION GAP: 4 mmol/L — ABNORMAL LOW (ref 5–14)
BLOOD UREA NITROGEN: 12 mg/dL (ref 9–23)
BUN / CREAT RATIO: 17
CALCIUM: 8.5 mg/dL — ABNORMAL LOW (ref 8.7–10.4)
CHLORIDE: 102 mmol/L (ref 98–107)
CO2: 27.8 mmol/L (ref 20.0–31.0)
CREATININE: 0.71 mg/dL
EGFR CKD-EPI AA MALE: >90 mL/min/{1.73_m2} (ref >=60)
EGFR CKD-EPI NON-AA MALE: >90 mL/min/{1.73_m2} (ref >=60)
GLUCOSE RANDOM: 89 mg/dL (ref 70–179)
POTASSIUM: 4.2 mmol/L (ref 3.4–4.8)
SODIUM: 134 mmol/L — ABNORMAL LOW (ref 135–145)

## 2020-05-25 LAB — MAGNESIUM: MAGNESIUM: 1.5 mg/dL — ABNORMAL LOW (ref 1.6–2.6)

## 2020-05-25 MED ADMIN — cetirizine (ZyrTEC) tablet 10 mg: 10 mg | ORAL | @ 14:00:00

## 2020-05-25 MED ADMIN — fidaxomicin (DIFICID) tablet 200 mg: 200 mg | ORAL | @ 02:00:00 | Stop: 2020-05-31

## 2020-05-25 MED ADMIN — sodium chloride tablet 1 g: 1 g | ORAL | @ 14:00:00

## 2020-05-25 MED ADMIN — fidaxomicin (DIFICID) tablet 200 mg: 200 mg | ORAL | @ 14:00:00 | Stop: 2020-05-31

## 2020-05-25 MED ADMIN — enoxaparin (LOVENOX) syringe 40 mg: 40 mg | SUBCUTANEOUS | @ 14:00:00

## 2020-05-25 MED ADMIN — multivitamins, therapeutic with minerals tablet 1 tablet: 1 | ORAL | @ 14:00:00

## 2020-05-25 MED ADMIN — sodium chloride tablet 1 g: 1 g | ORAL | @ 02:00:00

## 2020-05-25 MED ADMIN — gabapentin (NEURONTIN) capsule 200 mg: 200 mg | ORAL | @ 14:00:00

## 2020-05-25 MED ADMIN — magnesium sulfate 2gm/50mL IVPB: 2 g | INTRAVENOUS | @ 22:00:00 | Stop: 2020-05-25

## 2020-05-25 MED ADMIN — sodium chloride tablet 1 g: 1 g | ORAL | @ 17:00:00

## 2020-05-25 MED ADMIN — pantoprazole (PROTONIX) EC tablet 40 mg: 40 mg | ORAL | @ 14:00:00

## 2020-05-25 MED ADMIN — gabapentin (NEURONTIN) capsule 200 mg: 200 mg | ORAL | @ 02:00:00

## 2020-05-25 MED ADMIN — senna (SENOKOT) tablet 2 tablet: 2 | ORAL | @ 17:00:00

## 2020-05-25 MED ADMIN — divalproex ER (DEPAKOTE ER) extended release 24 hr tablet 750 mg: 750 mg | ORAL | @ 02:00:00

## 2020-05-25 MED ADMIN — thiamine mononitrate (vit B1) tablet 200 mg: 200 mg | ORAL | @ 14:00:00

## 2020-05-25 MED ADMIN — OLANZapine (ZyPREXA) tablet 20 mg: 20 mg | ORAL | @ 02:00:00

## 2020-05-25 MED ADMIN — nicotine (NICODERM CQ) 21 mg/24 hr patch 1 patch: 1 | TRANSDERMAL | @ 14:00:00

## 2020-05-25 MED ADMIN — polyethylene glycol (MIRALAX) packet 17 g: 17 g | ORAL | @ 17:00:00

## 2020-05-25 MED ADMIN — dilTIAZem (CARDIZEM CD) 24 hr capsule 360 mg: 360 mg | ORAL | @ 22:00:00

## 2020-05-25 MED ADMIN — naltrexone (DEPADE) tablet 50 mg: 50 mg | ORAL | @ 17:00:00

## 2020-05-25 NOTE — Unmapped (Signed)
POC discussed with pt this am.  Pt able to make needs known and order when hungry.  Uses urinal at [facility].  No BM for couple of days per pt.  R forearm IV stated leaking.  L  Wrist inserted  by charge nurse.  Pt up in chair most of this shift.  Ive Mg given.  Will cont to monitor.       Problem: Adult Inpatient Plan of Care  Goal: Plan of Care Review  Outcome: Ongoing - Unchanged  Goal: Patient-Specific Goal (Individualized)  Outcome: Ongoing - Unchanged  Goal: Absence of Hospital-Acquired Illness or Injury  Outcome: Ongoing - Unchanged  Intervention: Identify and Manage Fall Risk  Recent Flowsheet Documentation  Taken 05/25/2020 1000 by Danae Chen, RN  Safety Interventions: bed alarm  Taken 05/25/2020 0800 by Danae Chen, RN  Safety Interventions:  ??? fall reduction program maintained  ??? isolation precautions  ??? nonskid shoes/slippers when out of bed  ??? bed alarm  Intervention: Prevent and Manage VTE (Venous Thromboembolism) Risk  Recent Flowsheet Documentation  Taken 05/25/2020 0800 by Danae Chen, RN  Activity Management:  ??? activity adjusted per tolerance  ??? activity encouraged  Goal: Optimal Comfort and Wellbeing  Outcome: Ongoing - Unchanged  Note: Pt able to turn self and make his needs known..    Goal: Readiness for Transition of Care  Outcome: Ongoing - Unchanged  Goal: Rounds/Family Conference  Outcome: Ongoing - Unchanged     Problem: Fall Injury Risk  Goal: Absence of Fall and Fall-Related Injury  Outcome: Ongoing - Unchanged  Note: Bed alarm on .  Intervention: Promote Injury-Free Environment  Recent Flowsheet Documentation  Taken 05/25/2020 1000 by Danae Chen, RN  Safety Interventions: bed alarm  Taken 05/25/2020 0800 by Danae Chen, RN  Safety Interventions:  ??? fall reduction program maintained  ??? isolation precautions  ??? nonskid shoes/slippers when out of bed  ??? bed alarm     Problem: Self-Care Deficit  Goal: Improved Ability to Complete Activities of Daily Living  Outcome: Ongoing - Unchanged Problem: Hypertension Comorbidity  Goal: Blood Pressure in Desired Range  Outcome: Ongoing - Unchanged     Problem: Seizure Disorder Comorbidity  Goal: Maintenance of Seizure Control  Outcome: Ongoing - Unchanged

## 2020-05-25 NOTE — Unmapped (Signed)
Vss; afebrile. No c/o pain. Oob to chair. Ambulates w/ walker. No acute resp/cardiac distress. All needs met. Will continue to monitor.    Problem: Adult Inpatient Plan of Care  Goal: Plan of Care Review  Outcome: Progressing  Goal: Patient-Specific Goal (Individualized)  Outcome: Progressing  Goal: Absence of Hospital-Acquired Illness or Injury  Outcome: Progressing  Intervention: Identify and Manage Fall Risk  Recent Flowsheet Documentation  Taken 05/25/2020 0400 by Sigmund Hazel, RN  Safety Interventions:   isolation precautions   fall reduction program maintained  Taken 05/25/2020 0200 by Sigmund Hazel, RN  Safety Interventions:   isolation precautions   fall reduction program maintained  Taken 05/25/2020 0000 by Sigmund Hazel, RN  Safety Interventions:   isolation precautions   fall reduction program maintained  Taken 05/24/2020 2200 by Sigmund Hazel, RN  Safety Interventions:   isolation precautions   fall reduction program maintained  Taken 05/24/2020 2000 by Sigmund Hazel, RN  Safety Interventions:   isolation precautions   fall reduction program maintained  Intervention: Prevent and Manage VTE (Venous Thromboembolism) Risk  Recent Flowsheet Documentation  Taken 05/24/2020 2000 by Sigmund Hazel, RN  Activity Management: up in chair  Goal: Optimal Comfort and Wellbeing  Outcome: Progressing  Goal: Readiness for Transition of Care  Outcome: Progressing  Goal: Rounds/Family Conference  Outcome: Progressing     Problem: Fall Injury Risk  Goal: Absence of Fall and Fall-Related Injury  Outcome: Progressing  Intervention: Promote Injury-Free Environment  Recent Flowsheet Documentation  Taken 05/25/2020 0400 by Sigmund Hazel, RN  Safety Interventions:   isolation precautions   fall reduction program maintained  Taken 05/25/2020 0200 by Sigmund Hazel, RN  Safety Interventions:   isolation precautions   fall reduction program maintained  Taken 05/25/2020 0000 by Sigmund Hazel, RN  Safety Interventions: isolation precautions   fall reduction program maintained  Taken 05/24/2020 2200 by Sigmund Hazel, RN  Safety Interventions:   isolation precautions   fall reduction program maintained  Taken 05/24/2020 2000 by Sigmund Hazel, RN  Safety Interventions:   isolation precautions   fall reduction program maintained     Problem: Self-Care Deficit  Goal: Improved Ability to Complete Activities of Daily Living  Outcome: Progressing     Problem: Hypertension Comorbidity  Goal: Blood Pressure in Desired Range  Outcome: Progressing     Problem: Seizure Disorder Comorbidity  Goal: Maintenance of Seizure Control  Outcome: Progressing

## 2020-05-25 NOTE — Unmapped (Signed)
Family Medicine Inpatient Service Progress Note    Team: Family Medicine [facility] (pgr [phone])    Hospital Day: 6    ASSESSMENT / PLAN:   Nicholas Rush is a 55 y.o. male with a past medical history significant for HTN, bipolar I disorder, alcohol use disorder c/b withdrawal seizures, recurrent c-diff who initially presented to the ED for alcohol withdrawal, found to be in atrial fibrillation with RVR and c. diff positive.    # C. difficile infection  History of recurrent infections- last in May 2021. Diarrhea has resolved.  - Continue oral antibiotics-fidaxomicin 200mg  BID x 10days (through 3/12)  - patient started on miralax and senna since no stool since 5/3    # Atrial Fibrillation:   Condition is stable. Patient currently in normal sinus rhythm with well-controlled rate. Suspect atrial fibrillation 2/2 alcohol use, hx of HTN possibly leading to dilated cardiomyopathy or other cardiac disease with alcohol withdrawal and active infection leading to RVR.   - Continue diltiazem ER 360mg  daily   - C. Diff positive, treating as above  - K 4.0 and Mg 1.5, repleted (goal Mg > 2, K > 4)  - Lipid panel in 02/2020 with LDL 44, total cholesterol 454. A1c at that time 5.5.   ??  # Alcohol Withdrawal - Alcohol Use Disorder:   Condition stable with last drink at 6pm 05/19/20.   - continue multivitamin  - continue thiamine  - restart naltrexone 50mg  daily  - zofran PRN  - melatonin at bedtime  - continue gabapentin 200mg  BID  - PT to follow up with patient today    # Bipolar I Disorder  well-controlled on Depakote and Zyprexa  - continue depakote 750mg  at bedtime and zyprexa 20mg  at bedtime. No indication for depakote level at this time.   ??  #HTN:   Normotensive this AM but with elevated pressures overnight  -diltiazem being used for rate control  -may need to consider additional agent (could restart patient's home amlodipine)  -will monitor  ??  #Tobacco use disorder:   Patient currently smoking 1PPD.   - Nicotine replacement therapy with patch  - tobacco cessation team consult complete    # Allergies  Patient reports symptom improvement  -continue home zyrtec  -if worsening symptoms, obtain COVID test  ??  # Checklist:  - IVF: none  - Tubes/Lines/Drains: peripheral IV  - Diet Regular  - Bowel Regimen: Miralax and senna  - DVT: SQ Lovenox  - Code Status:   Orders Placed This Encounter   Procedures   ??? Full Code     Standing Status:   Standing     Number of Occurrences:   1     - Dispo: floor    [ ]  Anticipated Discharge Location: SNF   [ ]  PT/OT/DME: PT/OT both 5xL  [ ]  CM/SW needs: Substance abuse resources, SNF arrangement  [ ]  Meds/Rx:  Not yet prescribed. Needs Pharmacy assistance (fidaxomicin)  [ ]  Teaching: None anticipated  [ ]  Follow up appt: Appointment needed  [ ]  Excuse letter: None anticipated  [ ]  Transport: Ambulance Needed    SUBJECTIVE:  Interval events: none    Patient reports that he is feeling pretty good. Runny nose and congestion have improved. Denies chest pain, shortness of breath, nausea/vomiting, diarrhea, headaches, dizziness, tremors, abdominal pain. Urinating normally. Eating well. Has not stooled since 5/3.    Would like to restart naltrexone, humira not due until Friday.     REVIEW  OF SYSTEMS:  Pertinent positives and negatives per HPI. A complete review of systems otherwise negative.    PHYSICAL EXAM:      Intake/Output Summary (Last 24 hours) at 05/25/2020 0606  Last data filed at 05/25/2020 0058  Gross per 24 hour   Intake 219.67 ml   Output 450 ml   Net -230.33 ml     CIWA for the past 24 hrs:   CIWA-Ar Total   05/24/20 0800 0       Recent Vitals:  Vitals:    05/25/20 0056   BP: 160/79   Pulse: 65   Resp: 16   Temp: 35.6 ??C (96 ??F)   SpO2: 99%     GEN: intervally more well-appearing, lying in bed, NAD  HEENT: NCAT, No scleral icterus. Conjunctiva non-erythematous. MMM.  CV: Normal rate and rhythm. No murmurs/rubs/gallops.  Pulm: Normal work of breathing on room air. No rhonchi, wheezing, crackles.  Abd: Flat. Nontender. No guarding, rebound.  Normoactive bowel sounds.    Neuro: A&O x 3. No focal deficits.  Ext: Trace pitting peripheral edema on left, no edema on right. Palpable distal pulses. Mild tremor on extension of hands  Skin: No rashes or skin lesions.     LABS/ STUDIES:    All imaging, laboratory studies, and other pertinent tests including electrocardiography within the last 24 hours were reviewed and are summarized within the assessment and plan.     NUTRITION:  Malnutrition Evaluation as performed by RD, LDN: Patient does not meet AND/ASPEN criteria for malnutrition at this time (05/23/20 0848)                      [name], MS4     I attest that I have reviewed the student note and that the components of the history of the present illness, the physical exam, and the assessment and plan documented were performed by me or were performed in my presence by the student where I verified the documentation and performed (or re-performed) the exam and medical decision making.     Vinnie Langton [name] MD  Family Medicine Resident, PGY-3  [facility]  05/25/20  12:53 PM

## 2020-05-26 LAB — BASIC METABOLIC PANEL
ANION GAP: 6 mmol/L (ref 5–14)
BLOOD UREA NITROGEN: 11 mg/dL (ref 9–23)
BUN / CREAT RATIO: 17
CALCIUM: 8.6 mg/dL — ABNORMAL LOW (ref 8.7–10.4)
CHLORIDE: 100 mmol/L (ref 98–107)
CO2: 26.2 mmol/L (ref 20.0–31.0)
CREATININE: 0.64 mg/dL
EGFR CKD-EPI AA MALE: >90 mL/min/{1.73_m2} (ref >=60)
EGFR CKD-EPI NON-AA MALE: >90 mL/min/{1.73_m2} (ref >=60)
GLUCOSE RANDOM: 107 mg/dL (ref 70–179)
POTASSIUM: 3.6 mmol/L (ref 3.4–4.8)
SODIUM: 132 mmol/L — ABNORMAL LOW (ref 135–145)

## 2020-05-26 LAB — MAGNESIUM: MAGNESIUM: 1.6 mg/dL (ref 1.6–2.6)

## 2020-05-26 MED ADMIN — sodium chloride tablet 1 g: 1 g | ORAL | @ 01:00:00

## 2020-05-26 MED ADMIN — gabapentin (NEURONTIN) capsule 200 mg: 200 mg | ORAL | @ 01:00:00

## 2020-05-26 MED ADMIN — dilTIAZem (CARDIZEM CD) 24 hr capsule 360 mg: 360 mg | ORAL | @ 22:00:00

## 2020-05-26 MED ADMIN — OLANZapine (ZyPREXA) tablet 20 mg: 20 mg | ORAL | @ 01:00:00

## 2020-05-26 MED ADMIN — thiamine mononitrate (vit B1) tablet 200 mg: 200 mg | ORAL | @ 13:00:00

## 2020-05-26 MED ADMIN — enoxaparin (LOVENOX) syringe 40 mg: 40 mg | SUBCUTANEOUS | @ 13:00:00

## 2020-05-26 MED ADMIN — multivitamins, therapeutic with minerals tablet 1 tablet: 1 | ORAL | @ 13:00:00

## 2020-05-26 MED ADMIN — nicotine (NICODERM CQ) 21 mg/24 hr patch 1 patch: 1 | TRANSDERMAL | @ 13:00:00

## 2020-05-26 MED ADMIN — fidaxomicin (DIFICID) tablet 200 mg: 200 mg | ORAL | @ 13:00:00 | Stop: 2020-05-31

## 2020-05-26 MED ADMIN — magnesium sulfate 2gm/50mL IVPB: 2 g | INTRAVENOUS | @ 22:00:00 | Stop: 2020-05-26

## 2020-05-26 MED ADMIN — fidaxomicin (DIFICID) tablet 200 mg: 200 mg | ORAL | @ 01:00:00 | Stop: 2020-05-31

## 2020-05-26 MED ADMIN — gabapentin (NEURONTIN) capsule 200 mg: 200 mg | ORAL | @ 13:00:00

## 2020-05-26 MED ADMIN — potassium chloride (KLOR-CON) packet 40 mEq: 40 meq | ORAL | @ 16:00:00 | Stop: 2020-05-26

## 2020-05-26 MED ADMIN — sodium chloride tablet 1 g: 1 g | ORAL | @ 13:00:00

## 2020-05-26 MED ADMIN — pantoprazole (PROTONIX) EC tablet 40 mg: 40 mg | ORAL | @ 13:00:00

## 2020-05-26 MED ADMIN — divalproex ER (DEPAKOTE ER) extended release 24 hr tablet 750 mg: 750 mg | ORAL | @ 01:00:00

## 2020-05-26 MED ADMIN — magnesium sulfate 2gm/50mL IVPB: 2 g | INTRAVENOUS | @ 16:00:00 | Stop: 2020-05-26

## 2020-05-26 MED ADMIN — cetirizine (ZyrTEC) tablet 10 mg: 10 mg | ORAL | @ 13:00:00

## 2020-05-26 MED ADMIN — senna (SENOKOT) tablet 2 tablet: 2 | ORAL | @ 19:00:00 | Stop: 2020-05-26

## 2020-05-26 MED ADMIN — naltrexone (DEPADE) tablet 50 mg: 50 mg | ORAL | @ 13:00:00

## 2020-05-26 MED ADMIN — sodium chloride tablet 1 g: 1 g | ORAL | @ 19:00:00

## 2020-05-26 NOTE — Unmapped (Addendum)
Plan of care discussed this am with pt via nurse and doctor.  Pt up sitting in chair, chair alarm on, when in bed the alarm is on. Pt up with SB assist.  Pt makes his needs known and orders food when hungry, Pt uses urinal, no BM on this nurses shift.  Denies pain for this nurse.  Pt took a shower, wash hair, brushed teeth complete clothes change.  Clean liens on bed and chair.  Lost PIV during shower. New PIV place for Mg.  Wants to wait until tomorrow to put in another.    Will cont to monitor.      Problem: Adult Inpatient Plan of Care  Goal: Plan of Care Review  Outcome: Ongoing - Unchanged  Goal: Patient-Specific Goal (Individualized)  Outcome: Ongoing - Unchanged  Goal: Absence of Hospital-Acquired Illness or Injury  Outcome: Ongoing - Unchanged  Intervention: Identify and Manage Fall Risk  Recent Flowsheet Documentation  Taken 05/26/2020 1100 by Danae Chen, RN  Safety Interventions:  ??? chair alarm  ??? fall reduction program maintained  ??? nonskid shoes/slippers when out of bed  Taken 05/26/2020 1000 by Danae Chen, RN  Safety Interventions:  ??? chair alarm  ??? fall reduction program maintained  Taken 05/26/2020 0900 by Danae Chen, RN  Safety Interventions:  ??? chair alarm  ??? lighting adjusted for tasks/safety  ??? nonskid shoes/slippers when out of bed  ??? fall reduction program maintained  Taken 05/26/2020 0800 by Danae Chen, RN  Safety Interventions:  ??? chair alarm  ??? lighting adjusted for tasks/safety  ??? nonskid shoes/slippers when out of bed  Taken 05/26/2020 0700 by Danae Chen, RN  Safety Interventions:  ??? fall reduction program maintained  ??? bed alarm  Intervention: Prevent and Manage VTE (Venous Thromboembolism) Risk  Recent Flowsheet Documentation  Taken 05/26/2020 0700 by Danae Chen, RN  Activity Management: activity adjusted per tolerance  Goal: Optimal Comfort and Wellbeing  Outcome: Ongoing - Unchanged  Note: Pt able to turn self in bed, up OOB with SB assist.   Goal: Readiness for Transition of Care  Outcome: Ongoing - Unchanged  Goal: Rounds/Family Conference  Outcome: Ongoing - Unchanged     Problem: Fall Injury Risk  Goal: Absence of Fall and Fall-Related Injury  Outcome: Ongoing - Unchanged  Note: Bed and Chair alarm uses walker    Intervention: Promote Injury-Free Environment  Recent Flowsheet Documentation  Taken 05/26/2020 1100 by Danae Chen, RN  Safety Interventions:  ??? chair alarm  ??? fall reduction program maintained  ??? nonskid shoes/slippers when out of bed  Taken 05/26/2020 1000 by Danae Chen, RN  Safety Interventions:  ??? chair alarm  ??? fall reduction program maintained  Taken 05/26/2020 0900 by Danae Chen, RN  Safety Interventions:  ??? chair alarm  ??? lighting adjusted for tasks/safety  ??? nonskid shoes/slippers when out of bed  ??? fall reduction program maintained  Taken 05/26/2020 0800 by Danae Chen, RN  Safety Interventions:  ??? chair alarm  ??? lighting adjusted for tasks/safety  ??? nonskid shoes/slippers when out of bed  Taken 05/26/2020 0700 by Danae Chen, RN  Safety Interventions:  ??? fall reduction program maintained  ??? bed alarm     Problem: Self-Care Deficit  Goal: Improved Ability to Complete Activities of Daily Living  Outcome: Ongoing - Unchanged     Problem: Hypertension Comorbidity  Goal: Blood Pressure in Desired Range  Outcome: Ongoing - Unchanged     Problem: Seizure Disorder Comorbidity  Goal: Maintenance of Seizure  Control  Outcome: Ongoing - Unchanged

## 2020-05-26 NOTE — Unmapped (Signed)
POC, VSS, pt denies pains, no complains, needs met, isolation precaution maintained, call bell within reach, will continue to monitor.   Problem: Adult Inpatient Plan of Care  Goal: Plan of Care Review  Outcome: Progressing  Goal: Patient-Specific Goal (Individualized)  Outcome: Progressing  Goal: Absence of Hospital-Acquired Illness or Injury  Outcome: Progressing  Intervention: Identify and Manage Fall Risk  Recent Flowsheet Documentation  Taken 05/26/2020 0200 by Oletta Cohn, RN  Safety Interventions:   lighting adjusted for tasks/safety   low bed  Taken 05/26/2020 0000 by Oletta Cohn, RN  Safety Interventions:   lighting adjusted for tasks/safety   low bed   isolation precautions  Taken 05/25/2020 2200 by Oletta Cohn, RN  Safety Interventions:   low bed   lighting adjusted for tasks/safety   isolation precautions  Taken 05/25/2020 2000 by Oletta Cohn, RN  Safety Interventions:   fall reduction program maintained   lighting adjusted for tasks/safety   low bed  Intervention: Prevent and Manage VTE (Venous Thromboembolism) Risk  Recent Flowsheet Documentation  Taken 05/25/2020 2000 by Oletta Cohn, RN  Activity Management:   activity adjusted per tolerance   activity encouraged   up in chair  Goal: Optimal Comfort and Wellbeing  Outcome: Progressing  Goal: Readiness for Transition of Care  Outcome: Progressing  Goal: Rounds/Family Conference  Outcome: Progressing     Problem: Fall Injury Risk  Goal: Absence of Fall and Fall-Related Injury  Outcome: Progressing  Intervention: Promote Injury-Free Environment  Recent Flowsheet Documentation  Taken 05/26/2020 0200 by Oletta Cohn, RN  Safety Interventions:   lighting adjusted for tasks/safety   low bed  Taken 05/26/2020 0000 by Oletta Cohn, RN  Safety Interventions:   lighting adjusted for tasks/safety   low bed   isolation precautions  Taken 05/25/2020 2200 by Oletta Cohn, RN  Safety Interventions:   low bed   lighting adjusted for tasks/safety isolation precautions  Taken 05/25/2020 2000 by Oletta Cohn, RN  Safety Interventions:   fall reduction program maintained   lighting adjusted for tasks/safety   low bed     Problem: Self-Care Deficit  Goal: Improved Ability to Complete Activities of Daily Living  Outcome: Progressing     Problem: Hypertension Comorbidity  Goal: Blood Pressure in Desired Range  Outcome: Progressing     Problem: Seizure Disorder Comorbidity  Goal: Maintenance of Seizure Control  Outcome: Progressing

## 2020-05-26 NOTE — Unmapped (Signed)
Family Medicine Inpatient Service Progress Note    Team: [facility] (pgr [phone])    Hospital Day: 7    ASSESSMENT / PLAN:   Nicholas Rush is a 55 y.o. male with PMH of HTN, bipolar I disorder, alcohol use disorder c/b withdrawal seizures, recurrent C diff infections who presented to the ED for alcohol withdrawal, found to be in atrial fibrillation with RVR and c. diff positive. Now completing treatment for C diff, hemodynamically stable, beyond timeframe for severe alcohol withdrawal, and awaiting placement at SNF.     # C. difficile infection  History of recurrent infections- last in May 2021. Diarrhea has resolved.  - Continue oral antibiotics-fidaxomicin 200mg  BID x 10days (through 3/12)  - Patient started on miralax and senna since no stool since 5/3. Will continue to monitor      # Atrial Fibrillation:   Condition is stable. Patient currently in normal sinus rhythm with well-controlled rate. Suspect atrial fibrillation 2/2 alcohol use, hx of HTN possibly leading to dilated cardiomyopathy or other cardiac disease with alcohol withdrawal and active infection leading to RVR.   - Continue diltiazem ER 360mg  daily   - C. Diff positive, treating as above  - K 3.6 and Mg 1.6, repleted (goal Mg > 2, K > 4)  - Lipid panel in 02/2020 with LDL 44, total cholesterol 454. A1c at that time 5.5.   ??  # Alcohol Withdrawal - Alcohol Use Disorder:   Condition stable with last drink at 6pm 05/19/20.   - continue multivitamin  - continue thiamine  - restart naltrexone 50mg  daily  - zofran PRN  - melatonin at bedtime  - continue gabapentin 200mg  BID    # Bipolar I Disorder  well-controlled on Depakote and Zyprexa  - continue depakote 750mg  at bedtime and zyprexa 20mg  at bedtime. No indication for depakote level at this time.   ??  #HTN:   Normotensive this AM but with intermittent elevated pressures   -diltiazem being used for rate control  -may need to consider additional agent (could restart patient's home amlodipine)  -will monitor  ??  #Tobacco use disorder:   Patient currently smoking 1PPD.   - Nicotine replacement therapy with patch  - tobacco cessation team consult complete    # Allergies  Patient reports symptom improvement  -continue home zyrtec  -if recurrent/worsening symptoms, obtain COVID test  ??  # Checklist:  - IVF: none  - Tubes/Lines/Drains: peripheral IV  - Diet Regular  - Bowel Regimen: Miralax and senna  - DVT: SQ Lovenox  - Code Status:   Orders Placed This Encounter   Procedures   ??? Full Code     Standing Status:   Standing     Number of Occurrences:   1     - Dispo: floor    [ ]  Anticipated Discharge Location: SNF   [ ]  PT/OT/DME: PT/OT both 5xL  [ ]  CM/SW needs: Substance abuse resources, SNF arrangement  [ ]  Meds/Rx:  Not yet prescribed. May need pharmacy assistance if fidaxomicin not completed prior to discharge.  [ ]  Teaching: None anticipated  [ ]  Follow up appt: Appointment needed  [ ]  Excuse letter: None anticipated  [ ]  Transport: Ambulance Needed    SUBJECTIVE:  Interval events: none    Patient states he is feeling well today. Slept well. Still has not had a bowel movement, denies abdominal pain. Denies headache, chest pain, shortness of breath. Allergy symptoms have resolved w/  restarting home zyrtec. States he will have a family member bring in Humira for Friday dose if he does not have a SNF placement by then.    REVIEW OF SYSTEMS:  Pertinent positives and negatives per HPI. A complete review of systems otherwise negative.    PHYSICAL EXAM:      Intake/Output Summary (Last 24 hours) at 05/26/2020 1126  Last data filed at 05/26/2020 0732  Gross per 24 hour   Intake 500 ml   Output 775 ml   Net -275 ml     No data found.    Recent Vitals:  Vitals:    05/26/20 0731   BP: 146/79   Pulse: 67   Resp: 18   Temp: 36.5 ??C   SpO2: 96%     GEN: intervally more well-appearing, lying in bed, NAD  HEENT: NCAT, No scleral icterus. Conjunctiva non-erythematous. MMM.  CV: Normal rate and rhythm. No murmurs/rubs/gallops.  Pulm: Normal work of breathing on room air. No rhonchi, wheezing, crackles.  Abd: Flat.  Nontender. No guarding, rebound.  Normoactive bowel sounds.    Neuro: A&O x 3. No focal deficits.  Ext: Trace pitting peripheral edema on left, no edema on right. Palpable distal pulses. Mild tremor on extension of hands  Skin: No rashes or skin lesions.     LABS/ STUDIES:    All imaging, laboratory studies, and other pertinent tests including electrocardiography within the last 24 hours were reviewed and are summarized within the assessment and plan.     NUTRITION:  Malnutrition Evaluation as performed by RD, LDN: Patient does not meet AND/ASPEN criteria for malnutrition at this time (05/23/20 0848)                      Jana Half, MD PGY1

## 2020-05-27 LAB — BASIC METABOLIC PANEL
ANION GAP: 4 mmol/L — ABNORMAL LOW (ref 5–14)
BLOOD UREA NITROGEN: 11 mg/dL (ref 9–23)
BUN / CREAT RATIO: 16
CALCIUM: 8.7 mg/dL (ref 8.7–10.4)
CHLORIDE: 104 mmol/L (ref 98–107)
CO2: 27.3 mmol/L (ref 20.0–31.0)
CREATININE: 0.67 mg/dL
EGFR CKD-EPI AA MALE: >90 mL/min/{1.73_m2} (ref >=60)
EGFR CKD-EPI NON-AA MALE: >90 mL/min/{1.73_m2} (ref >=60)
GLUCOSE RANDOM: 71 mg/dL (ref 70–179)
POTASSIUM: 4 mmol/L (ref 3.4–4.8)
SODIUM: 135 mmol/L (ref 135–145)

## 2020-05-27 LAB — MAGNESIUM: MAGNESIUM: 1.7 mg/dL (ref 1.6–2.6)

## 2020-05-27 MED ADMIN — polyethylene glycol (MIRALAX) packet 17 g: 17 g | ORAL

## 2020-05-27 MED ADMIN — multivitamins, therapeutic with minerals tablet 1 tablet: 1 | ORAL | @ 13:00:00

## 2020-05-27 MED ADMIN — dilTIAZem (CARDIZEM CD) 24 hr capsule 360 mg: 360 mg | ORAL | @ 16:00:00

## 2020-05-27 MED ADMIN — gabapentin (NEURONTIN) capsule 200 mg: 200 mg | ORAL

## 2020-05-27 MED ADMIN — sodium chloride tablet 1 g: 1 g | ORAL | @ 02:00:00

## 2020-05-27 MED ADMIN — melatonin tablet 3 mg: 3 mg | ORAL

## 2020-05-27 MED ADMIN — magnesium oxide (MAG-OX) tablet 800 mg: 800 mg | ORAL | @ 20:00:00

## 2020-05-27 MED ADMIN — cetirizine (ZyrTEC) tablet 10 mg: 10 mg | ORAL | @ 13:00:00

## 2020-05-27 MED ADMIN — gabapentin (NEURONTIN) capsule 200 mg: 200 mg | ORAL | @ 13:00:00

## 2020-05-27 MED ADMIN — nicotine (NICODERM CQ) 21 mg/24 hr patch 1 patch: 1 | TRANSDERMAL | @ 14:00:00

## 2020-05-27 MED ADMIN — thiamine mononitrate (vit B1) tablet 200 mg: 200 mg | ORAL | @ 13:00:00

## 2020-05-27 MED ADMIN — senna (SENOKOT) tablet 2 tablet: 2 | ORAL

## 2020-05-27 MED ADMIN — OLANZapine (ZyPREXA) tablet 20 mg: 20 mg | ORAL

## 2020-05-27 MED ADMIN — divalproex ER (DEPAKOTE ER) extended release 24 hr tablet 750 mg: 750 mg | ORAL

## 2020-05-27 MED ADMIN — fidaxomicin (DIFICID) tablet 200 mg: 200 mg | ORAL | @ 13:00:00 | Stop: 2020-05-31

## 2020-05-27 MED ADMIN — sodium chloride tablet 1 g: 1 g | ORAL | @ 13:00:00

## 2020-05-27 MED ADMIN — naltrexone (DEPADE) tablet 50 mg: 50 mg | ORAL | @ 13:00:00

## 2020-05-27 MED ADMIN — enoxaparin (LOVENOX) syringe 40 mg: 40 mg | SUBCUTANEOUS | @ 14:00:00

## 2020-05-27 MED ADMIN — fidaxomicin (DIFICID) tablet 200 mg: 200 mg | ORAL | Stop: 2020-05-31

## 2020-05-27 MED ADMIN — pantoprazole (PROTONIX) EC tablet 40 mg: 40 mg | ORAL | @ 13:00:00

## 2020-05-27 MED ADMIN — sodium chloride tablet 1 g: 1 g | ORAL | @ 21:00:00

## 2020-05-27 NOTE — Unmapped (Signed)
Family Medicine Inpatient Service Progress Note    Team: [facility] (pgr [phone])    Hospital Day: 8    ASSESSMENT / PLAN:   Nicholas Rush is a 55 y.o. male with PMH of HTN, bipolar I disorder, alcohol use disorder c/b withdrawal seizures, recurrent C diff infections who presented to the ED for alcohol withdrawal, found to be in atrial fibrillation with RVR and c. diff positive. Now completing treatment for C diff, hemodynamically stable, beyond timeframe for severe alcohol withdrawal, and awaiting placement at SNF.     # C. difficile infection  History of recurrent infections- last in May 2021. Diarrhea has resolved. Has not had a BM in 5 days. Overall has been non-toxic or ill appearing, abdomen is not tender to palpation and is soft without signs of peritonitis. Overall low concern for developing complication of C Diff including toxic megacolon. Will defer additional imaging or workup. Starting Max Ox 800 mg BID today for constipation.   - Continue oral antibiotics-fidaxomicin 200mg  BID x 10days (through 5/12)  - Patient started on miralax and senna since no stool since 5/3. Adding Max Ox 800 mg BID today.      #Paroxysmal Atrial Fibrillation:   Condition is stable. Patient currently in normal sinus rhythm with well-controlled rate. Suspect atrial fibrillation 2/2 alcohol use, hx of HTN possibly leading to dilated cardiomyopathy or other cardiac disease with alcohol withdrawal and active infection leading to RVR. Has been in NSR most recently.  - Continue diltiazem ER 360mg  daily   - C. Diff positive, treating as above  - K 4 and Mg 1.7, repleted Mg today (goal Mg > 2, K > 4)  - Lipid panel in 02/2020 with LDL 44, total cholesterol 454. A1c at that time 5.5.   ??  # Alcohol Withdrawal - Alcohol Use Disorder:   Condition stable with last drink at 6pm 05/19/20.   - continue multivitamin  - continue thiamine  - continue naltrexone 50mg  daily  - zofran PRN  - melatonin at bedtime  - continue gabapentin 200mg  BID    # Bipolar I Disorder  well-controlled on Depakote and Zyprexa  - continue depakote 750mg  at bedtime and zyprexa 20mg  at bedtime. No indication for depakote level at this time.   ??  #HTN:   Normotensive more recently but still with intermittently elevated pressures   -diltiazem being used for rate control  -may need to consider additional agent if still elevated (could restart patient's home amlodipine), deferring at this time  -will monitor  ??  #Tobacco use disorder:   Patient currently smoking 1PPD.   - Nicotine replacement therapy with patch  - tobacco cessation team consult complete    # Allergies  Patient reports symptom improvement since starting home medication.  -continue home zyrtec  -if recurrent/worsening symptoms, obtain COVID test  ??  # Checklist:  - IVF: none  - Tubes/Lines/Drains: peripheral IV  - Diet Regular  - Bowel Regimen: Miralax and senna. Starting Magnesium Oxide 800 mg BID today.  - DVT: SQ Lovenox  - Code Status:   Orders Placed This Encounter   Procedures   ??? Full Code     Standing Status:   Standing     Number of Occurrences:   1     - Dispo: floor    [ ]  Anticipated Discharge Location: SNF   [ ]  PT/OT/DME: PT/OT both 5xL  [ ]  CM/SW needs: Substance abuse resources, SNF arrangement  [ ]   Meds/Rx:  Not yet prescribed. May need pharmacy assistance if fidaxomicin not completed prior to discharge.  [ ]  Teaching: None anticipated  [ ]  Follow up appt: Appointment needed  [ ]  Excuse letter: None anticipated  [ ]  Transport: Ambulance Needed    SUBJECTIVE:  Interval events: No acute events overnight    Patient notes feeling well today. He denies new physical complaints, including chest pain, palpitations, fevers, abdominal pain, nausea, vomiting. He notes that he has still not had a bowel movements. Notes last bowel movement was 5 days ago.    REVIEW OF SYSTEMS:  Pertinent positives and negatives per HPI. A complete review of systems otherwise negative.    PHYSICAL EXAM:      Intake/Output Summary (Last 24 hours) at 05/27/2020 1248  Last data filed at 05/27/2020 1213  Gross per 24 hour   Intake 904.17 ml   Output 1500 ml   Net -595.83 ml     No data found.    Recent Vitals:  Vitals:    05/27/20 1207   BP: 160/81   Pulse: 65   Resp:    Temp:    SpO2:      GEN: more well-appearing, lying in bed, NAD  HEENT: NCAT, No scleral icterus. Conjunctiva non-erythematous. MMM.  CV: Normal rate and rhythm. No murmurs/rubs/gallops.  Pulm: Normal work of breathing on room air. No rhonchi, wheezing, crackles.  Abd: Flat. Nontender. No guarding, rebound.  Decreased bowel sounds.    Neuro: A&O x 3. No focal deficits.  Ext: Trace pitting peripheral edema on left, no edema on right. Palpable distal pulses. No tremors noted in extremities.  Skin: No rashes or skin lesions.     LABS/ STUDIES:    All imaging, laboratory studies, and other pertinent tests including electrocardiography within the last 24 hours were reviewed and are summarized within the assessment and plan.     NUTRITION:  Malnutrition Evaluation as performed by RD, LDN: Patient does not meet AND/ASPEN criteria for malnutrition at this time (05/23/20 0848)                      Cathlyn Parsons, MD PGY1

## 2020-05-27 NOTE — Unmapped (Signed)
Pt assisted with needs.Denies pain and no acute distress noted at this time.Fall  precaution maintained.Encouraged to call for help when in need.Will keep monitoring.   Problem: Adult Inpatient Plan of Care  Goal: Plan of Care Review  Outcome: Progressing  Goal: Patient-Specific Goal (Individualized)  Outcome: Progressing  Goal: Absence of Hospital-Acquired Illness or Injury  Outcome: Progressing  Intervention: Identify and Manage Fall Risk  Recent Flowsheet Documentation  Taken 05/26/2020 1920 by Wonda Olds, RN  Safety Interventions:  ??? bed alarm  ??? chair alarm  ??? lighting adjusted for tasks/safety  ??? low bed  ??? isolation precautions  Intervention: Prevent and Manage VTE (Venous Thromboembolism) Risk  Recent Flowsheet Documentation  Taken 05/26/2020 1920 by Wonda Olds, RN  Activity Management: activity adjusted per tolerance  Goal: Optimal Comfort and Wellbeing  Outcome: Progressing  Goal: Readiness for Transition of Care  Outcome: Progressing  Goal: Rounds/Family Conference  Outcome: Progressing     Problem: Fall Injury Risk  Goal: Absence of Fall and Fall-Related Injury  Outcome: Progressing  Intervention: Promote Injury-Free Environment  Recent Flowsheet Documentation  Taken 05/26/2020 1920 by Wonda Olds, RN  Safety Interventions:  ??? bed alarm  ??? chair alarm  ??? lighting adjusted for tasks/safety  ??? low bed  ??? isolation precautions     Problem: Hypertension Comorbidity  Goal: Blood Pressure in Desired Range  Outcome: Progressing

## 2020-05-28 LAB — BASIC METABOLIC PANEL
ANION GAP: 6 mmol/L (ref 5–14)
BLOOD UREA NITROGEN: 13 mg/dL (ref 9–23)
BUN / CREAT RATIO: 18
CALCIUM: 8.3 mg/dL — ABNORMAL LOW (ref 8.7–10.4)
CHLORIDE: 103 mmol/L (ref 98–107)
CO2: 26.1 mmol/L (ref 20.0–31.0)
CREATININE: 0.72 mg/dL
EGFR CKD-EPI AA MALE: >90 mL/min/{1.73_m2} (ref >=60)
EGFR CKD-EPI NON-AA MALE: >90 mL/min/{1.73_m2} (ref >=60)
GLUCOSE RANDOM: 99 mg/dL (ref 70–179)
POTASSIUM: 3.8 mmol/L (ref 3.4–4.8)
SODIUM: 135 mmol/L (ref 135–145)

## 2020-05-28 LAB — MAGNESIUM: MAGNESIUM: 1.5 mg/dL — ABNORMAL LOW (ref 1.6–2.6)

## 2020-05-28 MED ADMIN — sodium chloride tablet 1 g: 1 g | ORAL | @ 15:00:00

## 2020-05-28 MED ADMIN — gabapentin (NEURONTIN) capsule 200 mg: 200 mg | ORAL | @ 15:00:00

## 2020-05-28 MED ADMIN — OLANZapine (ZyPREXA) tablet 20 mg: 20 mg | ORAL | @ 01:00:00

## 2020-05-28 MED ADMIN — divalproex ER (DEPAKOTE ER) extended release 24 hr tablet 750 mg: 750 mg | ORAL | @ 01:00:00

## 2020-05-28 MED ADMIN — gabapentin (NEURONTIN) capsule 200 mg: 200 mg | ORAL | @ 01:00:00

## 2020-05-28 MED ADMIN — pantoprazole (PROTONIX) EC tablet 40 mg: 40 mg | ORAL | @ 15:00:00

## 2020-05-28 MED ADMIN — magnesium oxide (MAG-OX) tablet 800 mg: 800 mg | ORAL | @ 01:00:00

## 2020-05-28 MED ADMIN — naltrexone (DEPADE) tablet 50 mg: 50 mg | ORAL | @ 15:00:00

## 2020-05-28 MED ADMIN — sodium chloride tablet 1 g: 1 g | ORAL | @ 02:00:00

## 2020-05-28 MED ADMIN — potassium chloride (KLOR-CON) packet 40 mEq: 40 meq | ORAL | @ 17:00:00 | Stop: 2020-05-28

## 2020-05-28 MED ADMIN — magnesium sulfate 2gm/50mL IVPB: 2 g | INTRAVENOUS | @ 17:00:00 | Stop: 2020-05-28

## 2020-05-28 MED ADMIN — cetirizine (ZyrTEC) tablet 10 mg: 10 mg | ORAL | @ 15:00:00

## 2020-05-28 MED ADMIN — fidaxomicin (DIFICID) tablet 200 mg: 200 mg | ORAL | @ 15:00:00 | Stop: 2020-05-31

## 2020-05-28 MED ADMIN — thiamine mononitrate (vit B1) tablet 200 mg: 200 mg | ORAL | @ 15:00:00

## 2020-05-28 MED ADMIN — magnesium oxide (MAG-OX) tablet 800 mg: 800 mg | ORAL | @ 15:00:00 | Stop: 2020-05-28

## 2020-05-28 MED ADMIN — polyethylene glycol (MIRALAX) packet 17 g: 17 g | ORAL | @ 01:00:00

## 2020-05-28 MED ADMIN — senna (SENOKOT) tablet 2 tablet: 2 | ORAL | @ 01:00:00

## 2020-05-28 MED ADMIN — sodium chloride tablet 1 g: 1 g | ORAL | @ 17:00:00

## 2020-05-28 MED ADMIN — nicotine (NICODERM CQ) 21 mg/24 hr patch 1 patch: 1 | TRANSDERMAL | @ 15:00:00

## 2020-05-28 MED ADMIN — enoxaparin (LOVENOX) syringe 40 mg: 40 mg | SUBCUTANEOUS | @ 15:00:00

## 2020-05-28 MED ADMIN — dilTIAZem (CARDIZEM CD) 24 hr capsule 360 mg: 360 mg | ORAL | @ 22:00:00

## 2020-05-28 MED ADMIN — multivitamins, therapeutic with minerals tablet 1 tablet: 1 | ORAL | @ 15:00:00

## 2020-05-28 MED ADMIN — polyethylene glycol (MIRALAX) packet 17 g: 17 g | ORAL | @ 17:00:00

## 2020-05-28 MED ADMIN — senna (SENOKOT) tablet 2 tablet: 2 | ORAL | @ 17:00:00

## 2020-05-28 MED ADMIN — fidaxomicin (DIFICID) tablet 200 mg: 200 mg | ORAL | @ 01:00:00 | Stop: 2020-05-31

## 2020-05-28 NOTE — Unmapped (Signed)
Family Medicine Inpatient Service Progress Note    Team: [facility] (pgr [phone])    Hospital Day: 9    ASSESSMENT / PLAN:   Nicholas Rush is a 55 y.o. male with PMH of HTN, bipolar I disorder, alcohol use disorder c/b withdrawal seizures, recurrent C diff infections who presented to the ED for alcohol withdrawal, found to be in atrial fibrillation with RVR and c. diff positive. Now completing treatment for C diff, hemodynamically stable, beyond timeframe for severe alcohol withdrawal, and awaiting placement at SNF.     # C. difficile infection  History of recurrent infections- last in May 2021. Diarrhea has resolved. Has not had a BM in 6 days. Overall has been non-toxic or ill appearing, abdomen is not tender to palpation and is soft without signs of peritonitis, has diminished bowel sounds but they are still present. Overall low concern for developing complication of C Diff including toxic megacolon. Will defer additional imaging or workup. Increasing miralax and senna to BID and Max Ox 800 mg to TID today for constipation.   - Continue oral antibiotics-fidaxomicin 200mg  BID x 10days (through 5/12)  - Patient started on miralax and senna since no stool since 5/3. Increasing to BID today, and Max Ox to 800 mg TID.      #Paroxysmal Atrial Fibrillation:   Condition is stable. Patient currently in normal sinus rhythm with well-controlled rate. Suspect atrial fibrillation 2/2 alcohol use, hx of HTN possibly leading to dilated cardiomyopathy or other cardiac disease with alcohol withdrawal and active infection leading to RVR. Has been in NSR most recently.  - Continue diltiazem ER 360mg  daily   - C. Diff positive, treating as above  - K 3.8 and Mg 1.5, repleted K+ with 40 meq and Mg with 2 g IV today (goal Mg > 2, K > 4)  - Lipid panel in 02/2020 with LDL 44, total cholesterol 086. A1c at that time 5.5.   ??  # Alcohol Withdrawal - Alcohol Use Disorder:   Condition stable with last drink at 6pm 05/19/20.   - continue multivitamin  - continue thiamine  - continue naltrexone 50mg  daily  - zofran PRN  - melatonin at bedtime  - continue gabapentin 200mg  BID    # Bipolar I Disorder  well-controlled on Depakote and Zyprexa  - continue depakote 750mg  at bedtime and zyprexa 20mg  at bedtime. No indication for depakote level at this time.   ??  #HTN:   Normotensive more recently but still with intermittently elevated pressures   -diltiazem being used for rate control  -may need to consider additional agent if still elevated (could restart patient's home amlodipine), deferring at this time  -will monitor  ??  #Tobacco use disorder:   Patient currently smoking 1PPD.   - Nicotine replacement therapy with patch  - tobacco cessation team consult complete    # Allergies  Patient reports symptom improvement since starting home medication.  -continue home zyrtec  -if recurrent/worsening symptoms, obtain COVID test  ??  # Checklist:  - IVF: none  - Tubes/Lines/Drains: peripheral IV  - Diet Regular  - Bowel Regimen: Miralax and senna, increasing to twice daily. Increase Magnesium Oxide to 800 mg TID today.  - DVT: SQ Lovenox  - Code Status:   Orders Placed This Encounter   Procedures   ??? Full Code     Standing Status:   Standing     Number of Occurrences:   1     -  Dispo: floor    [ ]  Anticipated Discharge Location: SNF   [ ]  PT/OT/DME: PT/OT both 5xL  [ ]  CM/SW needs: Substance abuse resources, SNF arrangement  [ ]  Meds/Rx:  Not yet prescribed. May need pharmacy assistance if fidaxomicin not completed prior to discharge.  [ ]  Teaching: None anticipated  [ ]  Follow up appt: Appointment needed  [ ]  Excuse letter: None anticipated  [ ]  Transport: Ambulance Needed    SUBJECTIVE:  Interval events: No acute events overnight    Patient notes feeling unchanged this morning. Denies new fevers, chills, CP, SOB, cough, nausea, vomiting, diarrhea, abdominal pain. He still has not had a bowel movement. Denies new complaints this morning. REVIEW OF SYSTEMS:  Pertinent positives and negatives per HPI. A complete review of systems otherwise negative.    PHYSICAL EXAM:      Intake/Output Summary (Last 24 hours) at 05/28/2020 1243  Last data filed at 05/28/2020 0900  Gross per 24 hour   Intake 1260 ml   Output 350 ml   Net 910 ml     No data found.    Recent Vitals:  Vitals:    05/28/20 1058   BP: 124/78   Pulse: 79   Resp: 16   Temp: 36.1 ??C   SpO2: 97%     GEN: more well-appearing, lying in bed, NAD  HEENT: NCAT, No scleral icterus. Conjunctiva non-erythematous. MMM.  CV: Normal rate and rhythm. No murmurs/rubs/gallops.  Pulm: Normal work of breathing on room air. No rhonchi, wheezing, crackles.  Abd: Mildly distended. Nontender. No guarding, rebound.  Decreased bowel sounds.    Neuro: A&O x 3. No focal deficits.  Ext: no LE edema. Palpable distal pulses. No tremors noted in extremities.  Skin: No rashes or skin lesions.     LABS/ STUDIES:    All imaging, laboratory studies, and other pertinent tests including electrocardiography within the last 24 hours were reviewed and are summarized within the assessment and plan.     NUTRITION:  Malnutrition Evaluation as performed by RD, LDN: Patient does not meet AND/ASPEN criteria for malnutrition at this time (05/23/20 0848)                      Cathlyn Parsons, MD PGY1

## 2020-05-28 NOTE — Unmapped (Signed)
No acute events overnight. Pt is alert and oriented x4. VSS stable. No complaints of pain at this time. Will continue to monitor.    Problem: Adult Inpatient Plan of Care  Goal: Plan of Care Review  Outcome: Progressing  Goal: Optimal Comfort and Wellbeing  Outcome: Progressing     Problem: Fall Injury Risk  Goal: Absence of Fall and Fall-Related Injury  Outcome: Progressing     Problem: Self-Care Deficit  Goal: Improved Ability to Complete Activities of Daily Living  Outcome: Progressing     Problem: Hypertension Comorbidity  Goal: Blood Pressure in Desired Range  Outcome: Progressing

## 2020-05-28 NOTE — Unmapped (Signed)
Patient is A&O.  Patient is sitting in chair watching TV. Patient VS are stable. Takes meds without any difficulty. No concern/issues at this time.  Problem: Adult Inpatient Plan of Care  Goal: Plan of Care Review  Outcome: Progressing  Goal: Patient-Specific Goal (Individualized)  Outcome: Progressing  Goal: Absence of Hospital-Acquired Illness or Injury  Outcome: Progressing  Intervention: Identify and Manage Fall Risk  Recent Flowsheet Documentation  Taken 05/27/2020 0810 by Lavone Orn, RN  Safety Interventions: fall reduction program maintained  Intervention: Prevent and Manage VTE (Venous Thromboembolism) Risk  Recent Flowsheet Documentation  Taken 05/27/2020 0810 by Lavone Orn, RN  Activity Management: activity adjusted per tolerance  Intervention: Prevent Infection  Recent Flowsheet Documentation  Taken 05/27/2020 0810 by Lavone Orn, RN  Infection Prevention: visitors restricted/screened  Goal: Optimal Comfort and Wellbeing  Outcome: Progressing  Goal: Readiness for Transition of Care  Outcome: Progressing  Goal: Rounds/Family Conference  Outcome: Progressing     Problem: Fall Injury Risk  Goal: Absence of Fall and Fall-Related Injury  Outcome: Progressing  Intervention: Promote Injury-Free Environment  Recent Flowsheet Documentation  Taken 05/27/2020 0810 by Lavone Orn, RN  Safety Interventions: fall reduction program maintained     Problem: Self-Care Deficit  Goal: Improved Ability to Complete Activities of Daily Living  Outcome: Progressing     Problem: Hypertension Comorbidity  Goal: Blood Pressure in Desired Range  Outcome: Progressing     Problem: Seizure Disorder Comorbidity  Goal: Maintenance of Seizure Control  Outcome: Progressing     Problem: Infection  Goal: Absence of Infection Signs and Symptoms  Intervention: Prevent or Manage Infection  Recent Flowsheet Documentation  Taken 05/27/2020 0810 by Lavone Orn, RN  Infection Management: aseptic technique maintained  Isolation Precautions: enteric precautions maintained     Problem: Alcohol Withdrawal  Goal: Alcohol Withdrawal Symptom Control  Intervention: Minimize or Manage Alcohol Withdrawal Symptoms  Recent Flowsheet Documentation  Taken 05/27/2020 0810 by Lavone Orn, RN  Aspiration Precautions: awake/alert before oral intake     Problem: Acute Neurologic Deterioration (Alcohol Withdrawal)  Goal: Optimal Neurologic Function  Intervention: Prevent Seizure-Related Injury  Recent Flowsheet Documentation  Taken 05/27/2020 0810 by Lavone Orn, RN  Aspiration Prevention During Seizure: other (see comments)

## 2020-05-29 LAB — BASIC METABOLIC PANEL
ANION GAP: 6 mmol/L (ref 5–14)
BLOOD UREA NITROGEN: 11 mg/dL (ref 9–23)
BUN / CREAT RATIO: 19
CALCIUM: 8.5 mg/dL — ABNORMAL LOW (ref 8.7–10.4)
CHLORIDE: 101 mmol/L (ref 98–107)
CO2: 26.2 mmol/L (ref 20.0–31.0)
CREATININE: 0.59 mg/dL — ABNORMAL LOW
EGFR CKD-EPI AA MALE: >90 mL/min/{1.73_m2} (ref >=60)
EGFR CKD-EPI NON-AA MALE: >90 mL/min/{1.73_m2} (ref >=60)
GLUCOSE RANDOM: 93 mg/dL (ref 70–179)
POTASSIUM: 3.8 mmol/L (ref 3.4–4.8)
SODIUM: 133 mmol/L — ABNORMAL LOW (ref 135–145)

## 2020-05-29 LAB — MAGNESIUM: MAGNESIUM: 1.5 mg/dL — ABNORMAL LOW (ref 1.6–2.6)

## 2020-05-29 MED ADMIN — melatonin tablet 3 mg: 3 mg | ORAL

## 2020-05-29 MED ADMIN — multivitamins, therapeutic with minerals tablet 1 tablet: 1 | ORAL | @ 14:00:00

## 2020-05-29 MED ADMIN — gabapentin (NEURONTIN) capsule 200 mg: 200 mg | ORAL | @ 14:00:00

## 2020-05-29 MED ADMIN — magnesium oxide (MAG-OX) tablet 800 mg: 800 mg | ORAL

## 2020-05-29 MED ADMIN — sodium chloride tablet 1 g: 1 g | ORAL | @ 18:00:00

## 2020-05-29 MED ADMIN — potassium chloride (KLOR-CON) packet 40 mEq: 40 meq | ORAL | @ 14:00:00 | Stop: 2020-05-29

## 2020-05-29 MED ADMIN — sodium chloride tablet 1 g: 1 g | ORAL | @ 14:00:00

## 2020-05-29 MED ADMIN — fidaxomicin (DIFICID) tablet 200 mg: 200 mg | ORAL | @ 14:00:00 | Stop: 2020-05-31

## 2020-05-29 MED ADMIN — enoxaparin (LOVENOX) syringe 40 mg: 40 mg | SUBCUTANEOUS | @ 14:00:00

## 2020-05-29 MED ADMIN — cetirizine (ZyrTEC) tablet 10 mg: 10 mg | ORAL | @ 14:00:00

## 2020-05-29 MED ADMIN — OLANZapine (ZyPREXA) tablet 20 mg: 20 mg | ORAL

## 2020-05-29 MED ADMIN — magnesium oxide (MAG-OX) tablet 800 mg: 800 mg | ORAL | @ 14:00:00

## 2020-05-29 MED ADMIN — magnesium sulfate 2gm/50mL IVPB: 2 g | INTRAVENOUS | @ 17:00:00 | Stop: 2020-05-29

## 2020-05-29 MED ADMIN — magnesium sulfate 2gm/50mL IVPB: 2 g | INTRAVENOUS | @ 19:00:00 | Stop: 2020-05-29

## 2020-05-29 MED ADMIN — sodium chloride tablet 1 g: 1 g | ORAL | @ 01:00:00

## 2020-05-29 MED ADMIN — fidaxomicin (DIFICID) tablet 200 mg: 200 mg | ORAL | Stop: 2020-05-31

## 2020-05-29 MED ADMIN — thiamine mononitrate (vit B1) tablet 200 mg: 200 mg | ORAL | @ 14:00:00

## 2020-05-29 MED ADMIN — naltrexone (DEPADE) tablet 50 mg: 50 mg | ORAL | @ 14:00:00

## 2020-05-29 MED ADMIN — senna (SENOKOT) tablet 2 tablet: 2 | ORAL | @ 14:00:00

## 2020-05-29 MED ADMIN — gabapentin (NEURONTIN) capsule 200 mg: 200 mg | ORAL

## 2020-05-29 MED ADMIN — dilTIAZem (CARDIZEM CD) 24 hr capsule 360 mg: 360 mg | ORAL | @ 21:00:00

## 2020-05-29 MED ADMIN — magnesium oxide (MAG-OX) tablet 800 mg: 800 mg | ORAL | @ 18:00:00

## 2020-05-29 MED ADMIN — nicotine (NICODERM CQ) 21 mg/24 hr patch 1 patch: 1 | TRANSDERMAL | @ 14:00:00

## 2020-05-29 MED ADMIN — magnesium sulfate 2gm/50mL IVPB: 2 g | INTRAVENOUS | @ 14:00:00 | Stop: 2020-05-29

## 2020-05-29 MED ADMIN — divalproex ER (DEPAKOTE ER) extended release 24 hr tablet 750 mg: 750 mg | ORAL

## 2020-05-29 NOTE — Unmapped (Signed)
Pt was found vaping in his room earlier in the shift. It was explained to pt that it is against hospital policy to vape in his room.Pt questions were answered appropriately. Vape paraphernalia were taken and placed in pt folder to be given to him at discharge with pt verbalizing understanding.Pt assisted with needs.Denies pain and no acute distress noted at this time.Fall and Isolation precautionary measures maintained.Encouraged to call for help when in need.Will keep monitoring.   Problem: Fall Injury Risk  Goal: Absence of Fall and Fall-Related Injury  Outcome: Progressing  Intervention: Promote Injury-Free Environment  Recent Flowsheet Documentation  Taken 05/28/2020 1929 by Wonda Olds, RN  Safety Interventions:  ??? fall reduction program maintained  ??? lighting adjusted for tasks/safety  ??? low bed     Problem: Self-Care Deficit  Goal: Improved Ability to Complete Activities of Daily Living  Outcome: Progressing

## 2020-05-29 NOTE — Unmapped (Signed)
Patient is A&O X4. VS stable this shift. 1x dose given of mg via IV. IV infiltrated. MD aware. Order place No piv. No concerns at this time.   Problem: Adult Inpatient Plan of Care  Goal: Plan of Care Review  Outcome: Progressing  Goal: Patient-Specific Goal (Individualized)  Outcome: Progressing  Goal: Absence of Hospital-Acquired Illness or Injury  Outcome: Progressing  Intervention: Identify and Manage Fall Risk  Recent Flowsheet Documentation  Taken 05/28/2020 0810 by Lavone Orn, RN  Safety Interventions: fall reduction program maintained  Intervention: Prevent and Manage VTE (Venous Thromboembolism) Risk  Recent Flowsheet Documentation  Taken 05/28/2020 0810 by Lavone Orn, RN  Activity Management: activity adjusted per tolerance  Intervention: Prevent Infection  Recent Flowsheet Documentation  Taken 05/28/2020 0810 by Lavone Orn, RN  Infection Prevention: personal protective equipment utilized  Goal: Optimal Comfort and Wellbeing  Outcome: Progressing  Goal: Readiness for Transition of Care  Outcome: Progressing  Goal: Rounds/Family Conference  Outcome: Progressing     Problem: Fall Injury Risk  Goal: Absence of Fall and Fall-Related Injury  Outcome: Progressing  Intervention: Promote Injury-Free Environment  Recent Flowsheet Documentation  Taken 05/28/2020 0810 by Lavone Orn, RN  Safety Interventions: fall reduction program maintained     Problem: Self-Care Deficit  Goal: Improved Ability to Complete Activities of Daily Living  Outcome: Progressing     Problem: Hypertension Comorbidity  Goal: Blood Pressure in Desired Range  Outcome: Progressing     Problem: Seizure Disorder Comorbidity  Goal: Maintenance of Seizure Control  Outcome: Progressing     Problem: Infection  Goal: Absence of Infection Signs and Symptoms  Intervention: Prevent or Manage Infection  Recent Flowsheet Documentation  Taken 05/28/2020 0810 by Lavone Orn, RN  Infection Management: aseptic technique maintained  Isolation Precautions: enteric precautions maintained     Problem: Alcohol Withdrawal  Goal: Alcohol Withdrawal Symptom Control  Intervention: Minimize or Manage Alcohol Withdrawal Symptoms  Recent Flowsheet Documentation  Taken 05/28/2020 0810 by Lavone Orn, RN  Aspiration Precautions: awake/alert before oral intake

## 2020-05-29 NOTE — Unmapped (Signed)
Family Medicine Inpatient Service Progress Note    Team: [facility] (pgr [phone])    Hospital Day: 10    ASSESSMENT / PLAN:   Nicholas Rush is a 55 y.o. male with PMH of HTN, bipolar I disorder, alcohol use disorder c/b withdrawal seizures, recurrent C diff infections who presented to the ED for alcohol withdrawal, found to be in atrial fibrillation with RVR and c. diff positive. Now completing treatment for C diff, hemodynamically stable, beyond timeframe for severe alcohol withdrawal, and awaiting placement at SNF.     # C. difficile infection  History of recurrent infections- last in May 2021. Diarrhea has resolved. Has not had a BM in 6 days. Overall has been non-toxic or ill appearing, abdomen is not tender to palpation and is soft without signs of peritonitis, had diminished bowel sounds but they are still present. Overall low concern for developing complication of C Diff including toxic megacolon. Increased miralax and senna to BID and mag ox to 800 mg TID. Notes passage of several bowel movements 5/10.   - Continue oral antibiotics-fidaxomicin 200mg  BID x 10days (through 5/12)  - Constipation regimen: miralax and senna BID. Mag Ox 800 mg TID. Constipation has improved.  - Discontinue PPI today     #Paroxysmal Atrial Fibrillation:   Condition is stable. Patient currently in normal sinus rhythm with well-controlled rate. Suspect atrial fibrillation 2/2 alcohol use, hx of HTN possibly leading to dilated cardiomyopathy or other cardiac disease with alcohol withdrawal and active infection leading to RVR. Has been in NSR most recently.  - Continue diltiazem ER 360mg  daily   - C. Diff positive, treating as above  - K 3.8 and Mg 1.5, repleted K+ with 40 meq and Mg with 6 g IV today (goal Mg > 2, K > 4)  - Daily BMP and Mg  - Lipid panel in 02/2020 with LDL 44, total cholesterol 454. A1c at that time 5.5.   ??  # Alcohol Withdrawal - Alcohol Use Disorder:   Condition stable with last drink at 6pm 05/19/20.   - continue multivitamin  - continue thiamine  - continue naltrexone 50mg  daily  - zofran PRN  - melatonin at bedtime  - continue gabapentin 200mg  BID    # Bipolar I Disorder  well-controlled on Depakote and Zyprexa. Has been behaviorally stable and appropriate.  - continue depakote 750mg  at bedtime and zyprexa 20mg  at bedtime. No indication for depakote level at this time.   ??  #HTN:   Normotensive more recently but still with intermittently elevated pressures, have been stable more recently.   -diltiazem being used for rate control  -may need to consider additional agent if still elevated (could restart patient's home amlodipine), deferring at this time  -will monitor  ??  #Tobacco use disorder:   Patient currently smoking 1PPD.   - Nicotine replacement therapy with patch  - tobacco cessation team consult complete    #Hyponatremia: Has had Na of 133-135 most recently. Possibly related to poor PO intake vs alcohol use history.  - Daily BMP    # Allergies  Patient reports symptom improvement since starting home medication.  -continue home zyrtec  -if recurrent/worsening symptoms, obtain COVID test  ??  # Checklist:  - IVF: none  - Tubes/Lines/Drains: Placing new PIV for IV Mg  - Diet Regular  - Bowel Regimen: Miralax and senna twice daily. Magnesium Oxide 800 mg TID.  - DVT: SQ Lovenox  - Code Status:  Orders Placed This Encounter   Procedures   ??? Full Code     Standing Status:   Standing     Number of Occurrences:   1     - Dispo: floor    [ ]  Anticipated Discharge Location: SNF   [ ]  PT/OT/DME: PT/OT both 5xL  [ ]  CM/SW needs: Substance abuse resources, SNF arrangement  [ ]  Meds/Rx:  Not yet prescribed. May need pharmacy assistance if fidaxomicin not completed prior to discharge.  [ ]  Teaching: None anticipated  [ ]  Follow up appt: Appointment needed  [ ]  Excuse letter: None anticipated  [ ]  Transport: Ambulance Needed    SUBJECTIVE:  Interval events: No acute events overnight.    Patient notes feeling overall well this morning. Is pleasant and cooperative with interview. Denies new fevers, chills, CP, SOB, cough, nausea, vomiting, diarrhea, abdominal pain. He notes having several bowel movements yesterday which were loose but not watery. Denies bloody stools or pain. Denies new physical complaints.     REVIEW OF SYSTEMS:  Pertinent positives and negatives per HPI. A complete review of systems otherwise negative.    PHYSICAL EXAM:      Intake/Output Summary (Last 24 hours) at 05/29/2020 0807  Last data filed at 05/29/2020 0444  Gross per 24 hour   Intake 680 ml   Output 500 ml   Net 180 ml     No data found.    Recent Vitals:  Vitals:    05/29/20 0441   BP: 132/68   Pulse: 65   Resp: 18   Temp: 35.7 ??C   SpO2: 97%     GEN: well-appearing, lying in bed, NAD. Pleasant and cooperative.  HEENT: NCAT, No scleral icterus. Conjunctiva non-erythematous. MMM.  CV: Normal rate and rhythm. No murmurs/rubs/gallops.  Pulm: Normal work of breathing on room air. No rhonchi, wheezing, crackles.  Abd: Mildly distended. Nontender. No guarding, rebound.  Normoactive bowel sounds.    Neuro: A&O x 3. No focal deficits.  Ext: no LE edema. Palpable distal pulses. No tremors noted in extremities.  Skin: No rashes or skin lesions.     LABS/ STUDIES:    All imaging, laboratory studies, and other pertinent tests including electrocardiography within the last 24 hours were reviewed and are summarized within the assessment and plan.     NUTRITION:  Malnutrition Evaluation as performed by RD, LDN: Patient does not meet AND/ASPEN criteria for malnutrition at this time (05/23/20 0848)                      Cathlyn Parsons, MD PGY1

## 2020-05-30 LAB — BASIC METABOLIC PANEL
ANION GAP: 5 mmol/L (ref 5–14)
BLOOD UREA NITROGEN: 10 mg/dL (ref 9–23)
BUN / CREAT RATIO: 15
CALCIUM: 8.7 mg/dL (ref 8.7–10.4)
CHLORIDE: 102 mmol/L (ref 98–107)
CO2: 28.1 mmol/L (ref 20.0–31.0)
CREATININE: 0.68 mg/dL
EGFR CKD-EPI AA MALE: >90 mL/min/{1.73_m2} (ref >=60)
EGFR CKD-EPI NON-AA MALE: >90 mL/min/{1.73_m2} (ref >=60)
GLUCOSE RANDOM: 93 mg/dL (ref 70–179)
POTASSIUM: 4.3 mmol/L (ref 3.5–5.1)
SODIUM: 135 mmol/L (ref 135–145)

## 2020-05-30 LAB — MAGNESIUM: MAGNESIUM: 1.8 mg/dL (ref 1.6–2.6)

## 2020-05-30 MED ADMIN — magnesium oxide (MAG-OX) tablet 800 mg: 800 mg | ORAL | @ 13:00:00

## 2020-05-30 MED ADMIN — magnesium oxide (MAG-OX) tablet 800 mg: 800 mg | ORAL | @ 17:00:00

## 2020-05-30 MED ADMIN — gabapentin (NEURONTIN) capsule 200 mg: 200 mg | ORAL | @ 13:00:00

## 2020-05-30 MED ADMIN — OLANZapine (ZyPREXA) tablet 20 mg: 20 mg | ORAL

## 2020-05-30 MED ADMIN — dilTIAZem (CARDIZEM CD) 24 hr capsule 360 mg: 360 mg | ORAL | @ 23:00:00

## 2020-05-30 MED ADMIN — divalproex ER (DEPAKOTE ER) extended release 24 hr tablet 750 mg: 750 mg | ORAL

## 2020-05-30 MED ADMIN — nicotine (NICODERM CQ) 21 mg/24 hr patch 1 patch: 1 | TRANSDERMAL | @ 13:00:00

## 2020-05-30 MED ADMIN — melatonin tablet 3 mg: 3 mg | ORAL | @ 23:00:00

## 2020-05-30 MED ADMIN — magnesium sulfate 2gm/50mL IVPB: 2 g | INTRAVENOUS | @ 17:00:00 | Stop: 2020-05-30

## 2020-05-30 MED ADMIN — multivitamins, therapeutic with minerals tablet 1 tablet: 1 | ORAL | @ 13:00:00

## 2020-05-30 MED ADMIN — sodium chloride tablet 1 g: 1 g | ORAL | @ 13:00:00

## 2020-05-30 MED ADMIN — magnesium oxide (MAG-OX) tablet 800 mg: 800 mg | ORAL

## 2020-05-30 MED ADMIN — melatonin tablet 3 mg: 3 mg | ORAL

## 2020-05-30 MED ADMIN — gabapentin (NEURONTIN) capsule 200 mg: 200 mg | ORAL

## 2020-05-30 MED ADMIN — cetirizine (ZyrTEC) tablet 10 mg: 10 mg | ORAL | @ 13:00:00

## 2020-05-30 MED ADMIN — naltrexone (DEPADE) tablet 50 mg: 50 mg | ORAL | @ 13:00:00

## 2020-05-30 MED ADMIN — sodium chloride tablet 1 g: 1 g | ORAL | @ 17:00:00

## 2020-05-30 MED ADMIN — sodium chloride tablet 1 g: 1 g | ORAL | @ 02:00:00

## 2020-05-30 MED ADMIN — enoxaparin (LOVENOX) syringe 40 mg: 40 mg | SUBCUTANEOUS | @ 13:00:00

## 2020-05-30 MED ADMIN — fidaxomicin (DIFICID) tablet 200 mg: 200 mg | ORAL | Stop: 2020-05-31

## 2020-05-30 MED ADMIN — thiamine mononitrate (vit B1) tablet 200 mg: 200 mg | ORAL | @ 13:00:00

## 2020-05-30 MED ADMIN — fidaxomicin (DIFICID) tablet 200 mg: 200 mg | ORAL | @ 13:00:00 | Stop: 2020-05-30

## 2020-05-30 NOTE — Unmapped (Addendum)
Pt wife concern about discharge planning and requested to speak with PT today.Will pass it on to oncoming RN.Pt NAD at this time, fall precaution maintained.Encouraged to call for help when in need.Will keep monitoring.  Problem: Adult Inpatient Plan of Care  Goal: Plan of Care Review  Outcome: Progressing  Goal: Patient-Specific Goal (Individualized)  Outcome: Progressing  Goal: Absence of Hospital-Acquired Illness or Injury  Outcome: Progressing  Goal: Optimal Comfort and Wellbeing  Outcome: Progressing  Goal: Readiness for Transition of Care  Outcome: Progressing  Goal: Rounds/Family Conference  Outcome: Progressing     Problem: Fall Injury Risk  Goal: Absence of Fall and Fall-Related Injury  Outcome: Progressing     Problem: Self-Care Deficit  Goal: Improved Ability to Complete Activities of Daily Living  Outcome: Progressing

## 2020-05-30 NOTE — Unmapped (Incomplete)
Pt is A&O. Patient received 3 runs of mg. No c/o pain this shift.   Problem: Adult Inpatient Plan of Care  Goal: Plan of Care Review  Outcome: Progressing  Goal: Patient-Specific Goal (Individualized)  Outcome: Progressing  Goal: Absence of Hospital-Acquired Illness or Injury  Outcome: Progressing  Intervention: Identify and Manage Fall Risk  Recent Flowsheet Documentation  Taken 05/29/2020 0810 by Lavone Orn, RN  Safety Interventions: fall reduction program maintained  Intervention: Prevent and Manage VTE (Venous Thromboembolism) Risk  Recent Flowsheet Documentation  Taken 05/29/2020 0810 by Lavone Orn, RN  Activity Management: activity adjusted per tolerance  Intervention: Prevent Infection  Recent Flowsheet Documentation  Taken 05/29/2020 0810 by Lavone Orn, RN  Infection Prevention:   equipment surfaces disinfected   hand hygiene promoted  Goal: Optimal Comfort and Wellbeing  Outcome: Progressing  Goal: Readiness for Transition of Care  Outcome: Progressing  Goal: Rounds/Family Conference  Outcome: Progressing     Problem: Fall Injury Risk  Goal: Absence of Fall and Fall-Related Injury  Outcome: Progressing  Intervention: Promote Injury-Free Environment  Recent Flowsheet Documentation  Taken 05/29/2020 0810 by Lavone Orn, RN  Safety Interventions: fall reduction program maintained     Problem: Self-Care Deficit  Goal: Improved Ability to Complete Activities of Daily Living  Outcome: Progressing     Problem: Hypertension Comorbidity  Goal: Blood Pressure in Desired Range  Outcome: Progressing     Problem: Seizure Disorder Comorbidity  Goal: Maintenance of Seizure Control  Outcome: Progressing

## 2020-05-30 NOTE — Unmapped (Signed)
Family Medicine Inpatient Service Progress Note    Team: [facility] (pgr [phone])    Hospital Day: 11    ASSESSMENT / PLAN:   Nicholas Rush is a 55 y.o. male with PMH of HTN, bipolar I disorder, alcohol use disorder c/b withdrawal seizures, recurrent C diff infections who presented to the ED for alcohol withdrawal, found to be in atrial fibrillation with RVR and c. diff positive. Now completing treatment for C diff, hemodynamically stable, beyond timeframe for severe alcohol withdrawal, and awaiting placement at SNF.     # C. difficile infection  History of recurrent infections- last in May 2021. Diarrhea has resolved. Has not had a BM in 6 days. Overall has been non-toxic or ill appearing, abdomen is not tender to palpation and is soft without signs of peritonitis, had diminished bowel sounds but they are still present. Overall low concern for developing complication of C Diff including toxic megacolon. Increased miralax and senna to BID and mag ox to 800 mg TID. Notes passage of several bowel movements 5/10.   - Continue oral antibiotics-fidaxomicin 200mg  BID x 10days (through 5/12)  - Constipation regimen: senna BID. Mag Ox 800 mg TID    #Paroxysmal Atrial Fibrillation:   Condition is stable. Patient currently in normal sinus rhythm with well-controlled rate. Suspect atrial fibrillation 2/2 alcohol use, hx of HTN possibly leading to dilated cardiomyopathy or other cardiac disease with alcohol withdrawal and active infection leading to RVR. Has been in NSR most recently.  - Continue diltiazem ER 360mg  daily   - Daily BMP and Mg  - Lipid panel in 02/2020 with LDL 44, total cholesterol 440. A1c at that time 5.5.   ??  # Alcohol Withdrawal - Alcohol Use Disorder:   Condition stable with last drink at 6pm 05/19/20.   - continue multivitamin  - continue thiamine  - continue naltrexone 50mg  daily  - zofran PRN  - melatonin at bedtime  - continue gabapentin 200mg  BID    # Bipolar I Disorder  well-controlled on Depakote and Zyprexa. Has been behaviorally stable and appropriate.  - continue depakote 750mg  at bedtime and zyprexa 20mg  at bedtime. No indication for depakote level at this time.   ??  #HTN:   Normotensive more recently but still with intermittently elevated pressures, have been stable more recently.   -diltiazem being used for rate control  -may need to consider additional agent if still elevated (could restart patient's home amlodipine), deferring at this time  -will monitor  ??  #Tobacco use disorder:   Patient currently smoking 1PPD.   - Nicotine replacement therapy with patch  - tobacco cessation team consult complete    #Hyponatremia: Has had Na of 133-135 most recently. Possibly related to poor PO intake vs alcohol use history.  - Daily BMP    # Allergies  Patient reports symptom improvement since starting home medication.  -continue home zyrtec  -if recurrent/worsening symptoms, obtain COVID test  ??  # Checklist:  - IVF: none  - Tubes/Lines/Drains: Placing new PIV for IV Mg  - Diet Regular  - Bowel Regimen: Miralax and senna twice daily. Magnesium Oxide 800 mg TID.  - DVT: SQ Lovenox  - Code Status:   Orders Placed This Encounter   Procedures   ??? Full Code     Standing Status:   Standing     Number of Occurrences:   1     - Dispo: floor    [ ]  Anticipated  Discharge Location: SNF vs HH  [ ]  PT/OT/DME: PT/OT both 5xL  [ ]  CM/SW needs: Substance abuse resources, SNF arrangement  [ ]  Meds/Rx:  Not yet prescribed. May need pharmacy assistance if fidaxomicin not completed prior to discharge.  [ ]  Teaching: None anticipated  [ ]  Follow up appt: Appointment needed  [ ]  Excuse letter: None anticipated  [ ]  Transport: Ambulance Needed    SUBJECTIVE:  Interval events: No acute events overnight.    REVIEW OF SYSTEMS:  Pertinent positives and negatives per HPI. A complete review of systems otherwise negative.    PHYSICAL EXAM:      Intake/Output Summary (Last 24 hours) at 05/30/2020 1352  Last data filed at 05/30/2020 1000  Gross per 24 hour   Intake 237 ml   Output 1800 ml   Net -1563 ml     No data found.    Recent Vitals:  Vitals:    05/30/20 1240   BP: 122/72   Pulse: 67   Resp: 16   Temp: 36.4 ??C   SpO2: 92%     GEN: well-appearing, lying in bed, NAD. Pleasant and cooperative.  HEENT: NCAT, No scleral icterus. Conjunctiva non-erythematous. MMM.  CV: Normal rate and rhythm. No murmurs/rubs/gallops.  Pulm: Normal work of breathing on room air. No rhonchi, wheezing, crackles.  Abd: Mildly distended. Nontender. No guarding, rebound.  Normoactive bowel sounds.    Neuro: A&O x 3. No focal deficits.  Ext: no LE edema. Palpable distal pulses. No tremors noted in extremities.  Skin: No rashes or skin lesions.     LABS/ STUDIES:    All imaging, laboratory studies, and other pertinent tests including electrocardiography within the last 24 hours were reviewed and are summarized within the assessment and plan.     NUTRITION:  Malnutrition Evaluation as performed by RD, LDN: Patient does not meet AND/ASPEN criteria for malnutrition at this time (05/23/20 0848)                      Bernardo Heater, DO  PGY-3 Family Medicine

## 2020-05-31 MED ORDER — NICOTINE (POLACRILEX) 4 MG BUCCAL LOZENGE
BUCCAL | 0 refills | 5.00000 days | PRN
Start: 2020-05-31 — End: 2020-06-30

## 2020-05-31 MED ORDER — DILTIAZEM CD 360 MG CAPSULE,EXTENDED RELEASE 24 HR
ORAL_CAPSULE | ORAL | 0 refills | 30 days
Start: 2020-05-31 — End: ?

## 2020-05-31 MED ORDER — GABAPENTIN 100 MG CAPSULE
ORAL_CAPSULE | Freq: Two times a day (BID) | ORAL | 0 refills | 30.00000 days
Start: 2020-05-31 — End: ?

## 2020-05-31 MED ORDER — NICOTINE (POLACRILEX) 4 MG GUM
BUCCAL | 0 refills | 5 days | PRN
Start: 2020-05-31 — End: 2020-06-30

## 2020-05-31 MED ORDER — OLANZAPINE 20 MG TABLET
ORAL_TABLET | Freq: Every evening | ORAL | 0 refills | 30 days | Status: CN
Start: 2020-05-31 — End: 2020-06-30

## 2020-05-31 MED ADMIN — senna (SENOKOT) tablet 2 tablet: 2 | ORAL | @ 13:00:00

## 2020-05-31 MED ADMIN — sodium chloride tablet 1 g: 1 g | ORAL | @ 18:00:00

## 2020-05-31 MED ADMIN — OLANZapine (ZyPREXA) tablet 20 mg: 20 mg | ORAL | @ 01:00:00

## 2020-05-31 MED ADMIN — gabapentin (NEURONTIN) capsule 200 mg: 200 mg | ORAL | @ 13:00:00

## 2020-05-31 MED ADMIN — divalproex ER (DEPAKOTE ER) extended release 24 hr tablet 750 mg: 750 mg | ORAL | @ 01:00:00

## 2020-05-31 MED ADMIN — cetirizine (ZyrTEC) tablet 10 mg: 10 mg | ORAL | @ 13:00:00

## 2020-05-31 MED ADMIN — senna (SENOKOT) tablet 2 tablet: 2 | ORAL | @ 01:00:00

## 2020-05-31 MED ADMIN — sodium chloride tablet 1 g: 1 g | ORAL | @ 13:00:00

## 2020-05-31 MED ADMIN — pneumococcal conjugate (13-valent) (PREVNAR-13) vaccine 0.5 mL: .5 mL | INTRAMUSCULAR | @ 21:00:00 | Stop: 2020-05-31

## 2020-05-31 MED ADMIN — thiamine mononitrate (vit B1) tablet 200 mg: 200 mg | ORAL | @ 13:00:00

## 2020-05-31 MED ADMIN — dilTIAZem (CARDIZEM CD) 24 hr capsule 360 mg: 360 mg | ORAL | @ 21:00:00

## 2020-05-31 MED ADMIN — gabapentin (NEURONTIN) capsule 200 mg: 200 mg | ORAL | @ 01:00:00

## 2020-05-31 MED ADMIN — nicotine (NICODERM CQ) 21 mg/24 hr patch 1 patch: 1 | TRANSDERMAL | @ 13:00:00

## 2020-05-31 MED ADMIN — naltrexone (DEPADE) tablet 50 mg: 50 mg | ORAL | @ 13:00:00

## 2020-05-31 MED ADMIN — sodium chloride tablet 1 g: 1 g | ORAL | @ 01:00:00

## 2020-05-31 MED ADMIN — enoxaparin (LOVENOX) syringe 40 mg: 40 mg | SUBCUTANEOUS | @ 13:00:00

## 2020-05-31 MED ADMIN — magnesium oxide (MAG-OX) tablet 800 mg: 800 mg | ORAL | @ 01:00:00

## 2020-05-31 MED ADMIN — magnesium oxide (MAG-OX) tablet 800 mg: 800 mg | ORAL | @ 13:00:00

## 2020-05-31 MED ADMIN — multivitamins, therapeutic with minerals tablet 1 tablet: 1 | ORAL | @ 13:00:00

## 2020-05-31 MED ADMIN — fidaxomicin (DIFICID) tablet 200 mg: 200 mg | ORAL | @ 01:00:00 | Stop: 2020-05-30

## 2020-05-31 MED ADMIN — magnesium oxide (MAG-OX) tablet 800 mg: 800 mg | ORAL | @ 18:00:00

## 2020-05-31 NOTE — Unmapped (Signed)
OT saw pt today. Pt OOB to chair, unsteady gait. Continues on enteric precautions. Falls precautions maintained, bed/chair alarm in use. SW following for dispo. Call bell within reach. Continue to monitor.     Problem: Adult Inpatient Plan of Care  Goal: Plan of Care Review  Outcome: Progressing  Goal: Patient-Specific Goal (Individualized)  Outcome: Progressing  Goal: Absence of Hospital-Acquired Illness or Injury  Outcome: Progressing  Goal: Optimal Comfort and Wellbeing  Outcome: Progressing  Goal: Readiness for Transition of Care  Outcome: Progressing  Goal: Rounds/Family Conference  Outcome: Progressing     Problem: Fall Injury Risk  Goal: Absence of Fall and Fall-Related Injury  Outcome: Progressing     Problem: Self-Care Deficit  Goal: Improved Ability to Complete Activities of Daily Living  Outcome: Progressing     Problem: Hypertension Comorbidity  Goal: Blood Pressure in Desired Range  Outcome: Progressing     Problem: Seizure Disorder Comorbidity  Goal: Maintenance of Seizure Control  Outcome: Progressing

## 2020-05-31 NOTE — Unmapped (Signed)
Pharmacist Discharge Note  Patient Name: Nicholas Rush  Reason for admission: AWS, AFib with RVR, and C difficile colitis  Reason for writing this note: significant event or rationale that led to medication change    Highlighted medication changes with rationale (if applicable):    AFib/RVR  - Start diltiazem CD 240 mg daily for AFib  - Stop amlodipine  - Increase magnesium oxide from 800 mg daily to 800 mg three times daily for goal Mg >2    GI   - Stop omeprazole in setting of recurrent C difficile colitis and persistent hypomagnesemia  - Stop Tums due to post-C diff constipation    Alcohol use disorder  - Stop folate, as recent level >24 ng/mL  - Start thiamine 100 mg daily for Wernicke's encephalopathy prevention  - Increase gabapentin from 100 mg AM/200 mg PM to 200 mg twice daily for neuropathy and prolonged withdrawal symptoms    Tobacco cessation  - Start nicotine 4 mg gum/lozenge as needed    Medication access:  - No barriers identified    Outpatient follow-up:  [ ]  heart rate  [ ]  serum magnesium within 1 week(s)   [ ]  alcohol and tobacco cessation    Nicholas Rush [facility]   Clinical Pharmacist    Future Appointments   Date Time Provider Department Center   07/08/2020  9:15 AM Will Karsten Fells, MD [id number]

## 2020-05-31 NOTE — Unmapped (Signed)
POC discussed with patient. No falls noted during my shift thus far and I will continue to monitor patient. OOB to chair for first part of the shift. Ambulated with walker to bed. Patient finished c-diff treatment. Awaiting SNF bed offer and placement.    Problem: Adult Inpatient Plan of Care  Goal: Plan of Care Review  Outcome: Progressing  Goal: Patient-Specific Goal (Individualized)  Outcome: Progressing  Goal: Absence of Hospital-Acquired Illness or Injury  Outcome: Progressing  Intervention: Identify and Manage Fall Risk  Recent Flowsheet Documentation  Taken 05/31/2020 0000 by Wynn Maudlin, RN  Safety Interventions:   fall reduction program maintained   low bed   nonskid shoes/slippers when out of bed   isolation precautions  Taken 05/30/2020 2200 by Wynn Maudlin, RN  Safety Interventions:   fall reduction program maintained   isolation precautions   low bed   nonskid shoes/slippers when out of bed  Taken 05/30/2020 2000 by Wynn Maudlin, RN  Safety Interventions:   fall reduction program maintained   isolation precautions   low bed   nonskid shoes/slippers when out of bed  Intervention: Prevent and Manage VTE (Venous Thromboembolism) Risk  Recent Flowsheet Documentation  Taken 05/30/2020 2000 by Wynn Maudlin, RN  Activity Management: activity adjusted per tolerance  Goal: Optimal Comfort and Wellbeing  Outcome: Progressing  Goal: Readiness for Transition of Care  Outcome: Progressing  Goal: Rounds/Family Conference  Outcome: Progressing     Problem: Fall Injury Risk  Goal: Absence of Fall and Fall-Related Injury  Outcome: Progressing  Intervention: Promote Injury-Free Environment  Recent Flowsheet Documentation  Taken 05/31/2020 0000 by Wynn Maudlin, RN  Safety Interventions:   fall reduction program maintained   low bed   nonskid shoes/slippers when out of bed   isolation precautions  Taken 05/30/2020 2200 by Wynn Maudlin, RN  Safety Interventions:   fall reduction program maintained   isolation precautions   low bed   nonskid shoes/slippers when out of bed  Taken 05/30/2020 2000 by Wynn Maudlin, RN  Safety Interventions:   fall reduction program maintained   isolation precautions   low bed   nonskid shoes/slippers when out of bed     Problem: Self-Care Deficit  Goal: Improved Ability to Complete Activities of Daily Living  Outcome: Progressing     Problem: Hypertension Comorbidity  Goal: Blood Pressure in Desired Range  Outcome: Progressing     Problem: Seizure Disorder Comorbidity  Goal: Maintenance of Seizure Control  Outcome: Progressing     Problem: Infection  Goal: Absence of Infection Signs and Symptoms  Intervention: Prevent or Manage Infection  Recent Flowsheet Documentation  Taken 05/31/2020 0000 by Wynn Maudlin, RN  Isolation Precautions: enteric precautions maintained  Taken 05/30/2020 2200 by Wynn Maudlin, RN  Isolation Precautions: enteric precautions maintained  Taken 05/30/2020 2000 by Wynn Maudlin, RN  Isolation Precautions: enteric precautions maintained     Problem: Skin Injury Risk Increased  Goal: Skin Health and Integrity  Intervention: Optimize Skin Protection  Recent Flowsheet Documentation  Taken 05/31/2020 0000 by Wynn Maudlin, RN  Pressure Reduction Techniques: frequent weight shift encouraged  Taken 05/30/2020 2200 by Wynn Maudlin, RN  Pressure Reduction Techniques: frequent weight shift encouraged  Taken 05/30/2020 2000 by Wynn Maudlin, RN  Pressure Reduction Techniques: frequent weight shift encouraged

## 2020-06-01 LAB — BASIC METABOLIC PANEL
ANION GAP: 8 mmol/L (ref 5–14)
BLOOD UREA NITROGEN: 9 mg/dL (ref 9–23)
BUN / CREAT RATIO: 13
CALCIUM: 9.1 mg/dL (ref 8.7–10.4)
CHLORIDE: 100 mmol/L (ref 98–107)
CO2: 26.3 mmol/L (ref 20.0–31.0)
CREATININE: 0.69 mg/dL
EGFR CKD-EPI AA MALE: >90 mL/min/{1.73_m2} (ref >=60)
EGFR CKD-EPI NON-AA MALE: >90 mL/min/{1.73_m2} (ref >=60)
GLUCOSE RANDOM: 91 mg/dL (ref 70–179)
POTASSIUM: 4.3 mmol/L (ref 3.4–4.8)
SODIUM: 134 mmol/L — ABNORMAL LOW (ref 135–145)

## 2020-06-01 LAB — MAGNESIUM: MAGNESIUM: 1.6 mg/dL (ref 1.6–2.6)

## 2020-06-01 MED ORDER — NICOTINE (POLACRILEX) 4 MG BUCCAL LOZENGE
BUCCAL | 0 refills | 5.00000 days | Status: CP | PRN
Start: 2020-06-01 — End: 2020-07-01

## 2020-06-01 MED ORDER — GABAPENTIN 100 MG CAPSULE
ORAL_CAPSULE | Freq: Two times a day (BID) | ORAL | 0 refills | 30.00000 days | Status: CP
Start: 2020-06-01 — End: ?

## 2020-06-01 MED ORDER — MAGNESIUM OXIDE 400 MG (241.3 MG MAGNESIUM) TABLET
ORAL_TABLET | Freq: Three times a day (TID) | ORAL | 0 refills | 30.00000 days | Status: CP
Start: 2020-06-01 — End: ?

## 2020-06-01 MED ORDER — THIAMINE MONONITRATE (VITAMIN B1) 100 MG TABLET
ORAL_TABLET | Freq: Every day | ORAL | 0 refills | 90.00000 days | Status: CP
Start: 2020-06-01 — End: ?

## 2020-06-01 MED ADMIN — sodium chloride tablet 1 g: 1 g | ORAL | @ 18:00:00 | Stop: 2020-06-01

## 2020-06-01 MED ADMIN — varicella-zoster gE-AS01B (PF) (SHINGRIX) injection: .5 mL | INTRAMUSCULAR | @ 16:00:00 | Stop: 2020-06-01

## 2020-06-01 MED ADMIN — gabapentin (NEURONTIN) capsule 200 mg: 200 mg | ORAL | @ 01:00:00

## 2020-06-01 MED ADMIN — thiamine mononitrate (vit B1) tablet 200 mg: 200 mg | ORAL | @ 12:00:00 | Stop: 2020-06-01

## 2020-06-01 MED ADMIN — cetirizine (ZyrTEC) tablet 10 mg: 10 mg | ORAL | @ 12:00:00 | Stop: 2020-06-01

## 2020-06-01 MED ADMIN — magnesium oxide (MAG-OX) tablet 800 mg: 800 mg | ORAL | @ 12:00:00 | Stop: 2020-06-01

## 2020-06-01 MED ADMIN — senna (SENOKOT) tablet 2 tablet: 2 | ORAL | @ 01:00:00

## 2020-06-01 MED ADMIN — naltrexone (DEPADE) tablet 50 mg: 50 mg | ORAL | @ 12:00:00 | Stop: 2020-06-01

## 2020-06-01 MED ADMIN — multivitamins, therapeutic with minerals tablet 1 tablet: 1 | ORAL | @ 12:00:00 | Stop: 2020-06-01

## 2020-06-01 MED ADMIN — magnesium oxide (MAG-OX) tablet 800 mg: 800 mg | ORAL | @ 01:00:00

## 2020-06-01 MED ADMIN — nicotine (NICODERM CQ) 21 mg/24 hr patch 1 patch: 1 | TRANSDERMAL | @ 12:00:00 | Stop: 2020-06-01

## 2020-06-01 MED ADMIN — divalproex ER (DEPAKOTE ER) extended release 24 hr tablet 750 mg: 750 mg | ORAL | @ 01:00:00

## 2020-06-01 MED ADMIN — sodium chloride tablet 1 g: 1 g | ORAL | @ 01:00:00

## 2020-06-01 MED ADMIN — magnesium oxide (MAG-OX) tablet 800 mg: 800 mg | ORAL | @ 18:00:00 | Stop: 2020-06-01

## 2020-06-01 MED ADMIN — OLANZapine (ZyPREXA) tablet 20 mg: 20 mg | ORAL | @ 01:00:00

## 2020-06-01 MED ADMIN — sodium chloride tablet 1 g: 1 g | ORAL | @ 12:00:00 | Stop: 2020-06-01

## 2020-06-01 MED ADMIN — enoxaparin (LOVENOX) syringe 40 mg: 40 mg | SUBCUTANEOUS | @ 12:00:00 | Stop: 2020-06-01

## 2020-06-01 MED ADMIN — gabapentin (NEURONTIN) capsule 200 mg: 200 mg | ORAL | @ 12:00:00 | Stop: 2020-06-01

## 2020-06-01 MED ADMIN — senna (SENOKOT) tablet 2 tablet: 2 | ORAL | @ 12:00:00 | Stop: 2020-06-01

## 2020-06-01 NOTE — Unmapped (Signed)
Patient was given discharge instructions and teaching, verbalized understanding. IV removed and wnl.     Problem: Adult Inpatient Plan of Care  Goal: Plan of Care Review  Outcome: Discharged to Home  Goal: Patient-Specific Goal (Individualized)  Outcome: Discharged to Home  Goal: Absence of Hospital-Acquired Illness or Injury  Outcome: Discharged to Home  Intervention: Identify and Manage Fall Risk  Recent Flowsheet Documentation  Taken 06/01/2020 1200 by Emiliano Dyer, RN  Safety Interventions:   bed alarm   fall reduction program maintained   infection management   low bed   nonskid shoes/slippers when out of bed  Taken 06/01/2020 1000 by Emiliano Dyer, RN  Safety Interventions:   bed alarm   fall reduction program maintained   infection management   low bed   nonskid shoes/slippers when out of bed  Taken 06/01/2020 0800 by Emiliano Dyer, RN  Safety Interventions:   bed alarm   fall reduction program maintained   low bed   isolation precautions   nonskid shoes/slippers when out of bed  Intervention: Prevent and Manage VTE (Venous Thromboembolism) Risk  Recent Flowsheet Documentation  Taken 06/01/2020 0800 by Emiliano Dyer, RN  Activity Management: activity adjusted per tolerance  Intervention: Prevent Infection  Recent Flowsheet Documentation  Taken 06/01/2020 1200 by Emiliano Dyer, RN  Infection Prevention:   equipment surfaces disinfected   hand hygiene promoted   personal protective equipment utilized   rest/sleep promoted   single patient room provided  Taken 06/01/2020 1000 by Emiliano Dyer, RN  Infection Prevention:   equipment surfaces disinfected   hand hygiene promoted   personal protective equipment utilized   rest/sleep promoted   single patient room provided  Taken 06/01/2020 0800 by Emiliano Dyer, RN  Infection Prevention:   equipment surfaces disinfected   hand hygiene promoted   personal protective equipment utilized   rest/sleep promoted   single patient room provided  Goal: Optimal Comfort and Wellbeing  Outcome: Discharged to Home  Goal: Readiness for Transition of Care  Outcome: Discharged to Home  Goal: Rounds/Family Conference  Outcome: Discharged to Home     Problem: Infection  Goal: Absence of Infection Signs and Symptoms  Intervention: Prevent or Manage Infection  Recent Flowsheet Documentation  Taken 06/01/2020 1200 by Emiliano Dyer, RN  Infection Management: aseptic technique maintained  Isolation Precautions: enteric precautions maintained  Taken 06/01/2020 1000 by Emiliano Dyer, RN  Infection Management: aseptic technique maintained  Taken 06/01/2020 0800 by Emiliano Dyer, RN  Infection Management: aseptic technique maintained  Isolation Precautions: enteric precautions maintained     Problem: Fall Injury Risk  Goal: Absence of Fall and Fall-Related Injury  Outcome: Discharged to Home  Intervention: Promote Injury-Free Environment  Recent Flowsheet Documentation  Taken 06/01/2020 1200 by Emiliano Dyer, RN  Safety Interventions:   bed alarm   fall reduction program maintained   infection management   low bed   nonskid shoes/slippers when out of bed  Taken 06/01/2020 1000 by Emiliano Dyer, RN  Safety Interventions:   bed alarm   fall reduction program maintained   infection management   low bed   nonskid shoes/slippers when out of bed  Taken 06/01/2020 0800 by Emiliano Dyer, RN  Safety Interventions:   bed alarm   fall reduction program maintained   low bed   isolation precautions   nonskid shoes/slippers when out of bed     Problem: Self-Care Deficit  Goal: Improved  Ability to Complete Activities of Daily Living  Outcome: Discharged to Home     Problem: Hypertension Comorbidity  Goal: Blood Pressure in Desired Range  Outcome: Discharged to Home     Problem: Seizure Disorder Comorbidity  Goal: Maintenance of Seizure Control  Outcome: Discharged to Home     Problem: Alcohol Withdrawal  Goal: Alcohol Withdrawal Symptom Control  Intervention: Minimize or Manage Alcohol Withdrawal Symptoms  Recent Flowsheet Documentation  Taken 06/01/2020 1200 by Emiliano Dyer, RN  Aspiration Precautions: awake/alert before oral intake  Taken 06/01/2020 1000 by Emiliano Dyer, RN  Aspiration Precautions: awake/alert before oral intake  Taken 06/01/2020 0800 by Emiliano Dyer, RN  Aspiration Precautions: awake/alert before oral intake

## 2020-06-01 NOTE — Unmapped (Signed)
Physician Discharge Summary [facility]  [facility]  [address]  [place] [address]  Dept: [phone]  Loc: [phone]     Identifying Information:   Nicholas Rush  11-Sep-1965  [id number]    Primary Care Physician: Fabio Pierce, MD   Code Status: Full Code    Admit Date: 05/19/2020    Discharge Date: 06/01/2020     Discharge To: Home with Home Health and/or PT/OT.  They are working on getting the home health set up at this time, but will continue to follow.  He does have post-discharge PT/OT.      Discharge Service: [facility] - FAM - [place]     Discharge Attending Physician: Faylene Kurtz, MD    Discharge Diagnoses:  Principal Problem:    Paroxysmal atrial fibrillation (CMS-HCC)  Active Problems:    Bipolar disorder (CMS-HCC)    Alcohol use disorder, moderate, dependence (CMS-HCC)    Essential hypertension    Psoriasis    Psoriatic arthritis (CMS-HCC)    Tobacco use disorder  Resolved Problems:    Alcohol withdrawal seizure (CMS-HCC)    Hyponatremia    Hypomagnesemia    C. difficile diarrhea      Outpatient Provider Follow Up Issues:   [ ]  Recheck BMP and Mg as an outpatient given persistently low electrolytes  [ ]  Monitor HR  [ ]  Consider restarting home amlodipine / switching antihypertensives if BP still elevated  [ ]  Needs routine outpatient cancer screening (C-scope)  [ ]  Ensure Psychiatry follow-up, scheduled for 6/20  [ ]  Continue to work on alcohol and tobacco cessation    Hospital Course:   Nicholas Rush is a 55 y.o. male with PMH of HTN, bipolar I disorder, alcohol use disorder c/b withdrawal seizures, recurrent C diff infection who presented to the ED for alcohol withdrawal, found to be in atrial fibrillation with RVR and C.diff positive.    Paroxysmal atrial fibrillation  Patient presented in atrial fibrillation with RVR. Tachycardia initially not responsive to metoprolol. Responded to diltiazem. TSH slightly low but thyrotropin receptor antibodies negative. Echo 5/5 non-concerning. Etiology of atrial fibrillation likely infection and alcohol withdrawal. Patient managed on diltiazem with rate control and returned to normal sinus rhythm.   - Continue diltiazem CD 360 mg daily for AFib  - Stop amlodipine  - Increase magnesium oxide from 800 mg daily to 800 mg three times daily for goal Mg >2     Alcohol withdrawal, resolved  Alcohol use disorder  Patient reports drinking 3-4 bourbon shots per day, last drink at 6pm 05/19/20. He has withdrawn multiple times with complications including seizure with last withdrawal. No significant withdrawal symptoms this admission. CIWA scores ranged from 4-5. Transitioned off CIWA on hospital day 4 and managed with multivitamin, thiamine, gabapentin, melatonin at bedtime.  - Stop folate, as recent level >24 ng/mL  - Start thiamine 100 mg daily for Wernicke's encephalopathy prevention  - Increase gabapentin from 100 mg AM/200 mg PM to 200 mg twice daily for neuropathy and prolonged withdrawal symptoms     Recurrent C. difficile infection, resolved  Found to be positive for C. Diff. Patient with with improvement of diarrhea upon initiation of fidaxomicin. S/p fidaxomicin course 5/3-5/12 with resolution of diarrhea. Subsequently had constipation, managed with miralax and senna.   - Stop omeprazole in setting of recurrent C difficile colitis and persistent hypomagnesemia  - Stop Tums due to post-C diff constipation    Hypomagnesemia, resolved  Hypokalemia, resolved  Hyponatremia, resolved  Had persistently low Mg and K+ while admitted as well as intermittent mild hyponatremia (nadir 132), responsive to Mg/K repletion and PO intake.  - Mg supplementation as above     ? Bipolar I disorder  Managed with home Depakote 750 mg nightly and Zyprexa 20 mg nightly without changes. Per OP Psychiatrist, may not have BPD1 as this is a more recent diagnosis which is an atypical presentation. Additionally, in a prior hospitalization, was seen by Psychiatry consult who felt his symptoms were related to a medical condition rather than a primary mood disorder. Psychiatry follow-up already scheduled.     HTN  Home amlodipine was initially held due to soft BPs, not restarted due to diltiazem initiation. After heart rate stabilized, patient remained normotensive to mildly hypertensive (SBP 130-153).      Tobacco use disorder  Patient reported smoking 1PPD. Managed with nicotine replacement therapy with patch and PRN gum and lozenges were made available.  - Start nicotine 4 mg gum/lozenge as needed    Environmental allergies  Maintained on home Zyrtec.    COVID-19 Vaccination: Up to date    Touchbase with Outpatient Provider:  Warm Handoff: Completed on 06/01/20 by Princella Pellegrini  (Attending) via Epic Secure Chat    Procedures:  No admission procedures for hospital encounter.  ______________________________________________________________________  Discharge Medications:     Your Medication List      STOP taking these medications    amLODIPine 5 MG tablet  Commonly known as: NORVASC     folic acid 1 MG tablet  Commonly known as: FOLVITE     omeprazole 20 MG capsule  Commonly known as: PriLOSEC     TUMS 200 mg calcium (500 mg) chewable tablet  Generic drug: calcium carbonate        START taking these medications    diltiazem 360 MG 24 hr capsule  Commonly known as: CARDIZEM CD  Take 1 capsule (360 mg total) by mouth daily.     nicotine polacrilex 4 MG gum  Commonly known as: NICORETTE  Apply 1 each (4 mg total) to cheek every hour as needed for smoking cessation.     nicotine polacrilex 4 MG lozenge  Commonly known as: NICORETTE  Apply 1 lozenge (4 mg total) to cheek every hour as needed for smoking cessation.     thiamine mononitrate (vit B1) 100 mg Tab tablet  Take 1 tablet (100 mg total) by mouth daily.        CHANGE how you take these medications    gabapentin 100 MG capsule  Commonly known as: NEURONTIN  Take 2 capsules (200 mg total) by mouth Two (2) times a day.  What changed:   ?? how much to take  ?? how to take this  ?? when to take this  ?? additional instructions     magnesium oxide 400 mg (241.3 mg elemental) tablet  Commonly known as: MAG-OX  Take 2 tablets (800 mg total) by mouth Three (3) times a day.  What changed: when to take this        CONTINUE taking these medications    acetaminophen 500 MG tablet  Commonly known as: TYLENOL  Take 500 mg by mouth every six (6) hours as needed for pain.     CALCIUM 500 + D ORAL  Take 1 tablet by mouth daily.     cetirizine 10 MG tablet  Commonly known as: ZyrTEC  Take 10 mg by mouth daily.  divalproex ER 250 MG extended release 24 hr tablet  Commonly known as: DEPAKOTE ER  Take 3 tablets (750 mg total) by mouth nightly.     fluticasone propionate 50 mcg/actuation nasal spray  Commonly known as: FLONASE  2 sprays into each nostril daily.     HUMIRA(CF) 40 mg/0.4 mL injection  Generic drug: adalimumab  Inject the contents of 1 syringe (40 mg) under the skin every 14 days     melatonin 3 mg Tab  Take 1 tablet (3 mg total) by mouth every evening.     naltrexone 50 mg tablet  Commonly known as: DEPADE  Take 1 tablet (50 mg total) by mouth daily.     nicotine 21 mg/24 hr patch  Commonly known as: NICODERM CQ  Place 1 patch on the skin daily.     OLANZapine 20 MG tablet  Commonly known as: ZyPREXA  Take 1 tablet (20 mg total) by mouth nightly.     SENNA LAX ORAL  Take 1 tablet by mouth daily as needed (constipation).     sodium chloride 1 gram tablet  Take 1 tablet (1 g total) by mouth Three (3) times a day with a meal.     THERA-M Tab  Generic drug: multivitamin,tx-iron-minerals  Take 1 tablet by mouth daily.            Allergies:  Tramadol and Hydroxyzine  ______________________________________________________________________  Pending Test Results (if blank, then none):  Pending Labs     Order Current Status    Basic Metabolic Panel Collected (06/01/20 1028)    Magnesium Level Collected (06/01/20 1028)          Most Recent Labs:  All lab results last 24 hours - No results found for this or any previous visit (from the past 24 hour(s)).    Relevant Studies/Radiology (if blank, then none):  ECG 12 Lead    Result Date: 05/20/2020  NORMAL SINUS RHYTHM NORMAL ECG WHEN COMPARED WITH ECG OF 18-Mar-2020 11:54, NO SIGNIFICANT CHANGE WAS FOUND Confirmed by Rose-jones, Lisa ([id number]) on 05/20/2020 8:52:43 AM    ECG 12 lead (Adult)    Result Date: 05/20/2020  ATRIAL FIBRILLATION WITH RAPID VENTRICULAR RESPONSE NONSPECIFIC ST ABNORMALITY NONSPECIFIC T WAVE ABNORMALITY ABNORMAL ECG WHEN COMPARED WITH ECG OF 19-May-2020 20:38, ATRIAL FIBRILLATION HAS REPLACED SINUS RHYTHM VENT. RATE HAS INCREASED BY  80 BPM ST NOW DEPRESSED IN ANTEROLATERAL LEADS T WAVE INVERSION MORE EVIDENT IN INFERIOR LEADS Confirmed by Freeman Caldron ([id number]) on 05/20/2020 8:50:52 AM    ECG 12 lead (Adult)    Result Date: 05/20/2020  ATRIAL FIBRILLATION WITH RAPID VENTRICULAR RESPONSE NONSPECIFIC ST ABNORMALITY ABNORMAL ECG WHEN COMPARED WITH ECG OF 19-May-2020 22:14, NO SIGNIFICANT CHANGE WAS FOUND Confirmed by Freeman Caldron ([id number]) on 05/20/2020 8:49:23 AM    XR Chest Portable    Result Date: 05/20/2020  EXAM: XR CHEST PORTABLE DATE: 05/20/2020 12:03 AM ACCESSION: [id number] UN DICTATED: 05/20/2020 12:09 AM INTERPRETATION LOCATION: [facility] CLINICAL INDICATION: 55 years old Male with new a fib ; OTHER  COMPARISON: 03/19/2020 TECHNIQUE: Portable Chest Radiograph. FINDINGS: Hypoinflated but clear lungs. No pleural effusion or pneumothorax. Stable cardiomediastinal silhouette.     No acute airspace disease.    Echocardiogram Follow Up/Limited Echo    Result Date: 05/24/2020  Patient Info Name:     Nicholas Rush Age:     32 years DOB:     23-Oct-1965 Gender:     Male MRN:     [id number] Accession #:  [id number] Ht:     183 cm Wt:     89 kg BSA:     2.14 m2 HR:     71 bpm BP:     130 /     86 mmHg Technical Quality:     Fair Exam Date:     05/23/2020 8:36 AM Site Location:     [facility] Exam Location:     [facility] Admit Date:     05/19/2020 Exam Type:     ECHOCARDIOGRAM FOLLOW UP/LIMITED ECHO Study Info Indications      - New onset A. fib Limited 2D, color flow and Doppler transthoracic echocardiogram is performed. Staff Referring Physician:     SELF, REFERRED  ; Reading Fellow:     Alla German MD Sonographer:     Dennison Bulla Ordering Physician:     Lorane Gell Y Account #:     [id number] Summary   1. Limited study to assess ventricular function.   2. The left ventricle is normal in size with normal wall thickness.   3. The left ventricular systolic function is normal, LVEF is visually estimated at > 55%.   4. Mitral annular calcification is present (mild).   5. The right ventricle is mildly dilated in size, with normal systolic function. Left Ventricle   The left ventricle is normal in size with normal wall thickness.   The left ventricular systolic function is normal, LVEF is visually estimated at > 55%.   There is normal left ventricular diastolic function. Right Ventricle   The right ventricle is mildly dilated in size, with normal systolic function.   Right ventricle wall thickness is normal. Left Atrium   The left atrium is not well visualized but probably normal in size. Right Atrium   The right atrium is normal  in size. Aortic Valve   The aortic valve is trileaflet with normal appearing leaflets with normal excursion.   There is no significant aortic regurgitation.   There is no evidence of a significant transvalvular gradient. Pulmonic Valve   The pulmonic valve is poorly visualized, but probably normal.   There is no significant pulmonic regurgitation. Mitral Valve   The mitral valve leaflets are normal with normal leaflet mobility.   Mitral annular calcification is present (mild).   There is no significant mitral valve regurgitation.   Calcified subvalvular apparatus. Tricuspid Valve   The tricuspid valve leaflets are normal, with normal leaflet mobility.   There is no significant tricuspid regurgitation.   Pulmonary systolic pressure cannot be estimated due to insufficient TR jet. Pericardium/Pleural   There is no pericardial effusion. Inferior Vena Cava   IVC size and inspiratory change suggest normal right atrial pressure. (0-5 mmHg). Aorta   The aorta is normal in size in the visualized segments. Mitral Valve ---------------------------------------------------------------------- Name                                 Value        Normal ---------------------------------------------------------------------- MV Diastolic Function ---------------------------------------------------------------------- MV E Peak Velocity                 60 cm/s               MV A Peak Velocity                 62 cm/s               MV  E/A                                 1.0               MV Annular TDI ---------------------------------------------------------------------- MV Septal e' Velocity             6.6 cm/s         >=8.0 MV Lateral e' Velocity            9.7 cm/s        >=10.0 MV e' Average                          8.2               MV E/e' (Average)                      7.6 Tricuspid Valve ---------------------------------------------------------------------- Name                                 Value        Normal ---------------------------------------------------------------------- Estimated PAP/RSVP ---------------------------------------------------------------------- RA Pressure                         3 mmHg           <=5 Aortic Valve ---------------------------------------------------------------------- Name                                 Value        Normal ---------------------------------------------------------------------- AV Doppler ---------------------------------------------------------------------- AV Peak Velocity                   1.4 m/s Ventricles ---------------------------------------------------------------------- Name                                 Value        Normal ---------------------------------------------------------------------- LV Dimensions 2D/MM ---------------------------------------------------------------------- IVS Diastolic Thickness (2D)        1.0 cm       0.6-1.0 LVID Diastole (2D)                  5.2 cm       4.2-5.8 LVPW Diastolic Thickness (2D)                                1.0 cm       0.6-1.0 LVID Systole (2D)                   3.6 cm       2.5-4.0 RV Dimensions 2D/MM ---------------------------------------------------------------------- RV Basal Diastolic Dimension        4.0 cm       2.5-4.1 Report Signatures Finalized by Joneen Roach  MD on 05/24/2020 04:16 PM Resident Alla German  MD on 05/23/2020 09:37 AM    ______________________________________________________________________  Discharge Instructions:   Activity Instructions     Activity as tolerated            Diet Instructions     Discharge diet (specify)      Discharge Nutrition Therapy: Heart Healthy  Other Instructions     Call MD for:  persistent dizziness or light-headedness      Discharge instructions to patient: Call your primary care doctor and make an appointment to see them:      Within 2 weeks from the time you are discharged from the hospital               Follow Up instructions and Outpatient Referrals     Call MD for:  persistent dizziness or light-headedness      Referral to outpatient occupational therapy      Suggest Treatment: Evaluation with suggestions for treatment    Care coordination required?: No    Referral to outpatient physical therapy      Suggest Treatment: Evaluation with suggestions for treatment    Care coordination required?: No    Ambulatory referral to Home Health      Is this a [facility] or [facility] Patient?: Yes    Do you want agency provider parameter notifications or patient specific   provider parameter notifications?: Agency    Do you want to initiate remote patient monitoring?: No    Physician to follow patient's care: PCP    Disciplines requested:  Physical Therapy  Occupational Therapy       Physical Therapy requested: Evaluate and treat    Occupational Therapy Requested: Evaluate and treat        Appointments which have been scheduled for you    Jun 07, 2020  9:20 AM  (Arrive by 9:10 AM)  Coy Saunas FM with [facility] TRANSITION CLINIC  [facility] FAMILY MEDICINE [place] [facility] REGION) [address]  [place] [address]  [phone]      Jun 07, 2020 10:00 AM  (Arrive by 9:50 AM)  TRANSITIONS PROVIDER FM with Nada Boozer, MD  [facility] FAMILY MEDICINE  [facility] REGION) [address]  [place] [address]  [phone]      Jul 08, 2020  9:15 AM  (Arrive by 9:00 AM)  RETURN  STEP with Maryan Char, MD  [facility] PSYCHIATRY STEP [place] [facility]) [address] [place] [address]  [phone]           ______________________________________________________________________  Discharge Day Services:  BP 111/55  - Pulse 65  - Temp 36.1 ??C (96.9 ??F) (Oral)  - Resp 18  - Ht 182.9 cm (6')  - Wt 89.6 kg (197 lb 8.5 oz)  - SpO2 97%  - BMI 26.79 kg/m??   Pt seen on the day of discharge and determined appropriate for discharge.    GEN: well appearing, lying in bed, NAD   HEENT: NCAT, MMM. EOMI.  Neck: Supple.  CV: Regular rate and rhythm. No murmurs/rubs/gallops.  Pulm: CTAB. No wheezing, crackles, or rhonchi.  Abd: Flat.  Nontender. No guarding, rebound.  Normoactive bowel sounds.    Neuro: A&O x 3. No focal deficits.   Ext: No peripheral edema.  Palpable distal pulses.    Condition at Discharge: good    Length of Discharge: I spent greater than 30 mins in the discharge of this patient.

## 2020-06-01 NOTE — Unmapped (Signed)
POC discussed with patient. No falls noted during my shift thus far and I will continue to monitor patient. OOB to chair. Adequate urine output noted. Ambulated with walker and standby assist. Plan for patient to discharge to home in am with home health.     Problem: Adult Inpatient Plan of Care  Goal: Plan of Care Review  Outcome: Progressing  Goal: Patient-Specific Goal (Individualized)  Outcome: Progressing  Goal: Absence of Hospital-Acquired Illness or Injury  Outcome: Progressing  Intervention: Identify and Manage Fall Risk  Recent Flowsheet Documentation  Taken 05/31/2020 2200 by Wynn Maudlin, RN  Safety Interventions:   fall reduction program maintained   isolation precautions   low bed   nonskid shoes/slippers when out of bed  Taken 05/31/2020 2000 by Wynn Maudlin, RN  Safety Interventions:   fall reduction program maintained   low bed   nonskid shoes/slippers when out of bed   isolation precautions  Intervention: Prevent and Manage VTE (Venous Thromboembolism) Risk  Recent Flowsheet Documentation  Taken 05/31/2020 2000 by Wynn Maudlin, RN  Activity Management: activity adjusted per tolerance  Goal: Optimal Comfort and Wellbeing  Outcome: Progressing  Goal: Readiness for Transition of Care  Outcome: Progressing  Goal: Rounds/Family Conference  Outcome: Progressing     Problem: Fall Injury Risk  Goal: Absence of Fall and Fall-Related Injury  Outcome: Progressing  Intervention: Promote Injury-Free Environment  Recent Flowsheet Documentation  Taken 05/31/2020 2200 by Wynn Maudlin, RN  Safety Interventions:   fall reduction program maintained   isolation precautions   low bed   nonskid shoes/slippers when out of bed  Taken 05/31/2020 2000 by Wynn Maudlin, RN  Safety Interventions:   fall reduction program maintained   low bed   nonskid shoes/slippers when out of bed   isolation precautions     Problem: Self-Care Deficit  Goal: Improved Ability to Complete Activities of Daily Living  Outcome: Progressing Problem: Hypertension Comorbidity  Goal: Blood Pressure in Desired Range  Outcome: Progressing     Problem: Seizure Disorder Comorbidity  Goal: Maintenance of Seizure Control  Outcome: Progressing     Problem: Infection  Goal: Absence of Infection Signs and Symptoms  Intervention: Prevent or Manage Infection  Recent Flowsheet Documentation  Taken 05/31/2020 2200 by Wynn Maudlin, RN  Isolation Precautions: enteric precautions maintained  Taken 05/31/2020 2000 by Wynn Maudlin, RN  Isolation Precautions: enteric precautions maintained     Problem: Skin Injury Risk Increased  Goal: Skin Health and Integrity  Intervention: Optimize Skin Protection  Recent Flowsheet Documentation  Taken 05/31/2020 2200 by Wynn Maudlin, RN  Pressure Reduction Techniques: frequent weight shift encouraged  Taken 05/31/2020 2000 by Wynn Maudlin, RN  Pressure Reduction Techniques: frequent weight shift encouraged

## 2020-06-01 NOTE — Unmapped (Signed)
Home health has been set up for through the agency listed below. The Home health agency will be contacting you to set up a time for them to come see you in your home within 2 days of your discharge.  If you have not heard from them prior to 06/03/20 or you have any questions about home health, please contact them at the phone number listed below.        [facility]  Phone: [phone]         Fax: [phone]

## 2020-06-03 MED ORDER — DILTIAZEM CD 360 MG CAPSULE,EXTENDED RELEASE 24 HR
ORAL_CAPSULE | 0 refills | 0 days
Start: 2020-06-03 — End: ?

## 2020-06-04 NOTE — Unmapped (Signed)
We have received the referral placed for this patient and management has agreed to accept this referral, however we will need a more appropriate dx for this patient and the PT and OT orders that have been placed other than Alcohol use. As the primary MD for this patient I am writing to ask you to provide a dx and to ensure that you are willing to sign the plan of care (485) for this period of Home health we are admitting this patient into. I have sent a tele call to you also to ensure communication is received. Just an FYI, we have also called 3 times to the number listed in the chart and the wife s mobile number to ensure the address is correct and advise that the agency will call to set up a time where the therapist can come to see patient, neither time was the phone answered, and a message left. If your staff has another number if you will ask them to please place it on the referral or in the chart .Marland KitchenMarland KitchenThank you and have a great evening.

## 2020-06-05 ENCOUNTER — Inpatient Hospital Stay: Admit: 2020-06-05 | Discharge: 2020-06-25 | Payer: MEDICAID

## 2020-06-05 ENCOUNTER — Encounter: Admit: 2020-06-05 | Discharge: 2020-06-25 | Payer: MEDICAID

## 2020-06-06 NOTE — Unmapped (Incomplete)
Reason for Visit: Hospital Follow-up Medication Management    History of Present Illness:  Nicholas Rush is a 55 y.o. male with a past medical history of HTN, alcohol use disorder, tobacco use, psoriasis, recurrent Cdiff, and bipolar disorder who was recently hospitalized from 05/19/20 to 06/01/20 for new paroxysmal atrial fibrillation. Pt presents to the [facility] for follow-up {Blank single:19197::with,without} all of his medication bottles.     Since discharge, ***    Afib, new paroxysmal likely 2/2 infection, alcohol withdrawal  - continued diltiazem CD 360 daily  - Stopped amlodipine    Alcohol withdrawal  - daily thiamine, increased gabapentin    Recurrent Cdiff   - stopped omeprazole, TUMS    [ ]  check BMP  [ ]  BP - restart amlodipine?  [ ]  alcohol, tobacco cessation     Medication Adherence and Access:  ?? Missed doses?: {Blank multiple:19197::yes,no,***}  ?? Uses pillbox?: {Blank multiple:19197::yes,no,***}  ?? Anyone else assist with medication organization? {Blank multiple:19197::yes,no,***}  ?? Current insurance coverage: ***  ?? Preferred Pharmacy: ***   ?? Medications affordable?: {Blank multiple:19197::yes,no,***}  ?? Needs refills? {Blank multiple:19197::yes,no,***}    Allergies:   Allergies   Allergen Reactions   ??? Tramadol      Risk of seizures   ??? Hydroxyzine Itching     Sedation.  Ineffective for reducing anxiety.       Medications: Medications reviewed in EPIC medication station and updated today by the clinical pharmacist practitioner.  Current Outpatient Medications on File Prior to Visit   Medication Sig Dispense Refill   ??? acetaminophen (TYLENOL) 500 MG tablet Take 500 mg by mouth every six (6) hours as needed for pain.     ??? ADALIMUMAB SYRINGE CITRATE FREE 40 MG/0.4 ML Inject the contents of 1 syringe (40 mg) under the skin every 14 days 4 each 1   ??? calcium carbonate/vitamin D3 (CALCIUM 500 + D ORAL) Take 1 tablet by mouth daily.     ??? cetirizine (ZYRTEC) 10 MG tablet Take 10 mg by mouth daily.     ??? dilTIAZem (CARDIZEM CD) 360 MG 24 hr capsule Take 1 capsule (360 mg total) by mouth daily. 30 capsule 0   ??? divalproex ER (DEPAKOTE ER) 250 MG extended release 24 hr tablet Take 3 tablets (750 mg total) by mouth nightly. 270 tablet 1   ??? fluticasone propionate (FLONASE) 50 mcg/actuation nasal spray 2 sprays into each nostril daily. 16 g 12   ??? gabapentin (NEURONTIN) 100 MG capsule Take 2 capsules (200 mg total) by mouth Two (2) times a day. 120 capsule 0   ??? magnesium oxide (MAG-OX) 400 mg (241.3 mg elemental magnesium) tablet Take 2 tablets (800 mg total) by mouth Three (3) times a day. 180 tablet 0   ??? melatonin 3 mg Tab Take 1 tablet (3 mg total) by mouth every evening. 30 tablet 0   ??? multivitamin,tx-iron-minerals (THERA-M) Tab Take 1 tablet by mouth daily. 30 tablet 0   ??? naltrexone (DEPADE) 50 mg tablet Take 1 tablet (50 mg total) by mouth daily. 30 tablet 0   ??? nicotine (NICODERM CQ) 21 mg/24 hr patch Place 1 patch on the skin daily. 28 patch 5   ??? nicotine polacrilex (NICORETTE MINI) lozenge 4 MG Apply 1 lozenge (4 mg total) to cheek every hour as needed for smoking cessation. 108 each 0   ??? nicotine polacrilex (NICORETTE) 4 MG gum Apply 1 each (4 mg total) to cheek every hour  as needed for smoking cessation. 110 each 0   ??? OLANZapine (ZYPREXA) 20 MG tablet Take 1 tablet (20 mg total) by mouth nightly. 30 tablet 0   ??? sennosides (SENNA LAX ORAL) Take 1 tablet by mouth daily as needed (constipation).     ??? sodium chloride 1 gram tablet Take 1 tablet (1 g total) by mouth Three (3) times a day with a meal. 100 tablet 2   ??? thiamine mononitrate, vit B1, 100 mg Tab tablet Take 1 tablet (100 mg total) by mouth daily. 90 tablet 0     No current facility-administered medications on file prior to visit.       Assessment and Plan:     # ***  ???       # ***   ???     # Medication Management   ?? Encouraged use of medication pill box and reviewed appropriate use. Reviewed the indication, dose, and frequency of each medication with patient.       Recommendations and medication-related problems were discussed directly with the patient's transitions of care physician, Dr. ***, immediately following the pharmacist visit prior to the physician visit. Questions/concerns were addressed to the patient's satisfaction. I spent a total of {NUMBERS; 0-45 BY 5:10291} minutes {VISIT GNFA:21308} with the patient delivering clinical care and providing education/counseling.      Future Appointments   Date Time Provider Department Center   06/07/2020  9:20 AM [facility] TRANSITION CLINIC [facility]   06/07/2020 10:00 AM Nada Boozer, MD [facility]   07/08/2020  9:15 AM Will Karsten Fells, MD [facility]     ___________________________________________________   Derrel Nip, PharmD  PGY1 Pharmacy Resident

## 2020-06-07 NOTE — Unmapped (Unsigned)
Transitions of Care Note    Subjective      Admission Date: 05/19/20  Discharge Date: 06/01/20  Discharge Hospital/Unit: 1 [facility]  The patient was discharged from { :42770} and sent to { :42771}.    Post discharge interactive communication via telephone was made with patient on *** and I have reviewed the information from that communication.    Today's (06/07/2020) face to face interactive visit is within { :42769} days of discharge.    Chief Complaint: Patient is here in follow-up to their recent hospitalization and to discuss the following medical problems: ***    HISTORY OF PRESENT ILLNESS:    Nicholas Rush is a 54 y.o. male who presents for transitions hospital follow up.    Hospitalized for: ***    Interval update: ***    Pt reported factors contributing to admission or ED visit: { :42772}    Patient uses pill box { :42817}    PHQ-9 PHQ-9 TOTAL SCORE   04/11/2020 12   04/01/2020 0   03/25/2020 3   03/09/2020 4   02/22/2020 1   01/15/2020 7   12/11/2019 9       The remaining 10 systems reviewed were negative.      {Seen by Pharmacist and/or Social Worker:42773}    I have reviewed the patients discharge summary for this hospitalization.  I have also reviewed the problem list, allergies, family and social history and updated them as needed.         Objective     Nicholas Rush  vitals were not taken for this visit.     GEN: well appearing, NAD ***   HEENT: NCAT, No scleral icterus. Conjunctiva non-erythematous. MMM.   CV: Regular rate and rhythm. No murmurs/rubs/gallops.   Pulm: Normal work of breathing on ***. CTAB. No wheezing, crackles, or rhonchi.   Abd: Flat.  Nontender. No guarding, rebound.  Normoactive bowel sounds.     Neuro: A&O x 3. No focal deficits.   Ext: No peripheral edema.  Palpable distal pulses.   Skin: No obvious rashes or skin lesions.    Labs:  I have reviewed the labs from this hospitalization and the ones on the day of discharge and have followed up on any pending labs at the time of discharge.  See Epic Labs section for details.         Assessment/Plan:     Problem List Items Addressed This Visit     None           {DischargeMedRec:50284}      The following medications changes were made: ***    Biggest Risk for Readmission: ***    Items to follow-up on next visit: ***    Medical decision making was of { :42768} complexity.    Necessary referral have been made.  See Visit Summary for details of referrals.    I will forward my plan and recommendations to patients PCP, Rupal Delmar Landau, MD    Follow-up with PCP or another provider has been scheduled:   Future Appointments   Date Time Provider Department Center   06/07/2020  9:20 AM [facility] TRANSITION CLINIC [facility]   06/07/2020 10:00 AM Nada Boozer, MD [facility]   06/11/2020 To Be Determined Scherry Ran, OT [facility]   07/08/2020  9:15 AM Will Karsten Fells, MD [facility]        Total time spent face to face with  the patient was 40 minutes of  which *** minutes were spent counseling/coordinating care regarding his: recent hospitalization and the following conditions: ***         [facility]  [place] [facility] [place] [facility]  [address] [place], [place] [address] ??? Telephone [phone] ??? Fax [phone]  [email or web address]

## 2020-06-07 NOTE — Unmapped (Signed)
[facility] Family [facility]   Care Management Progress Note             Date of Service:  06/07/2020      Service:  Care Management - phone    Purpose of contact:         Discussed the following with Nicholas Brooklyn. Hamer Rush :        Barriers to Care:  N/A    Provider/Care Partner(s)  to follow up on:   N/A    Health Maintenance:  Health Maintenance Due   Topic Date Due   ??? DTaP/Tdap/Td Vaccines (2 - Td or Tdap) 02/21/2019   ??? FIT-DNA Stool Test  06/17/2019     CM contacted patient as it relates to Transition of Care appointment scheduled today. Patient stated that he would like to reschedule for next Friday morning due to his ride not being able to bring him this morning. CM discussed with patient that he would meet with social worker, pharmacist, and provider. CM discussed with patient contacting local [place] Medicaid transportation for next scheduled appointment.     Additional Information/Plan:  Patient provided my direct contact information and encouraged to contact me should additional needs arise.    Minutes spent providing outreach: 10    [name]  [facility]  [facility]

## 2020-06-10 NOTE — Unmapped (Signed)
Left message for pt to follow up on if he was able to contact Medicaid transportation for his scheduled Transition of Care appointment scheduled for 06/14/20.        Nicholas Rush [facility]   [facility]   [phone]

## 2020-06-11 NOTE — Unmapped (Unsigned)
[facility] has made a second and final attempt to reach this patient to refill the following medication:Humira.      We have left voicemails on the following phone numbers: [phone] and [phone] and have sent a MyChart message.    Dates contacted: 5/19 and 5/24  Last scheduled delivery: 4/29    The patient may be at risk of non-compliance with this medication. The patient should call the [facility] at [phone] (option 4) to refill medication.    Travion Ke D Administrator [facility] Specialty Technician

## 2020-06-13 NOTE — Unmapped (Signed)
Left message for pt to remind him of scheduled transition of care appointment.         Taran Haynesworth [facility]   [facility]   [phone]

## 2020-06-18 NOTE — Unmapped (Signed)
Patient called and left a message wanting to know if you would fill out a PC form 5031 for him.

## 2020-06-20 ENCOUNTER — Telehealth: Admit: 2020-06-20 | Discharge: 2020-06-21 | Payer: MEDICAID

## 2020-06-20 DIAGNOSIS — E871 Hypo-osmolality and hyponatremia: Principal | ICD-10-CM

## 2020-06-20 DIAGNOSIS — R197 Diarrhea, unspecified: Principal | ICD-10-CM

## 2020-06-20 DIAGNOSIS — R296 Repeated falls: Principal | ICD-10-CM

## 2020-06-20 DIAGNOSIS — I10 Essential (primary) hypertension: Principal | ICD-10-CM
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
## 2020-06-21 DIAGNOSIS — A498 Other bacterial infections of unspecified site: Secondary | ICD-10-CM | POA: Diagnosis present

## 2020-06-21 NOTE — Unmapped (Signed)
New diagnosis - in afib w RVR, asx, thought to be due to alcohol withdrawal  Reports adherence to Diltiazem 360  Pulse Readings from Last 2 Encounters:   06/20/20 101   06/14/20 104   reports he just took it before our visit  Echo completed 5/5, limited but normal LVEF   chads2vasc score = 1 for h/o HTN, low-mod risk for stroke  Consider discussion of antiplatelet therapy

## 2020-06-21 NOTE — Unmapped (Signed)
Previously was on lisinopril 20mg , which was switched to amlodipine 10mg  two hospitalizations ago.  During May hospitalization, found to be in new onset a-fib, started on diltiazem and amlodipine was discontinued due to low BP.    BP Readings from Last 2 Encounters:   06/20/20 146/89   06/14/20 150/80   BP mildly elevated on home check today, but I'm worried his c dif could come back, and then he would again get dehydrated and hypotensive.    Continue to monitor, consider restarting an anti-HTN if cdif neg
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
## 2020-06-21 NOTE — Unmapped (Signed)
Typically with constipation at baseline, and he recently took a laxative, and now having more loose stools.  H/o c-dif x 3:  05/2020, treated with Fidaxomicin 200mg m BID x 10 days  05/2019, reated with vanc 125mg  po q6 x 10 days  09/2018, treated with vanc 125mg  po q6 x 10 days  - now with loose stools again -- C dif ordered  - urged him to pay for a cab to get to the lab tomorrow if needed  - will e-consult GI for recs on what to do if he has another infection

## 2020-06-21 NOTE — Unmapped (Addendum)
Generalized weakness, particularly in the lower legs, after multiple hospital stays  HHPT has come a few times, <=1x per week  Trying to do exercises and use the walker around the house  He remains essentially homebound.    Prior to last hospital stay, he attempted to take public transit to the local lab, but the bus driver asked him NOT to board the bus due to his instability  - will complete medicaid PCS form

## 2020-06-21 NOTE — Unmapped (Signed)
Reports that naltrexone helps with decreasing etoh cravings  Last etoh was on the weekend, 3 drinks in one sitting

## 2020-06-24 ENCOUNTER — Telehealth: Admit: 2020-06-24 | Discharge: 2020-06-25 | Payer: MEDICAID

## 2020-06-24 DIAGNOSIS — A498 Other bacterial infections of unspecified site: Principal | ICD-10-CM

## 2020-06-24 NOTE — Unmapped (Signed)
Reports his last BM was last Wed or Thurs. Diarrhea has resolved.  Has stopped sennakot.  - reports he has not been taking MgOx as directed, only 1 tab twice daily - advised to increase to 2 tabs thriee times per day  - if that doesn't help his constipation, advised to try colace 100mg  once daily
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
## 2020-06-25 ENCOUNTER — Encounter: Admit: 2020-06-25 | Discharge: 2020-06-25 | Payer: MEDICAID

## 2020-06-25 DIAGNOSIS — F10280 Alcohol dependence with alcohol-induced anxiety disorder: Principal | ICD-10-CM

## 2020-06-25 LAB — BASIC METABOLIC PANEL
ANION GAP: 9 mmol/L (ref 5–14)
BLOOD UREA NITROGEN: 6 mg/dL — ABNORMAL LOW (ref 9–23)
BUN / CREAT RATIO: 8
CALCIUM: 8.7 mg/dL (ref 8.7–10.4)
CHLORIDE: 99 mmol/L (ref 98–107)
CO2: 24 mmol/L (ref 20.0–31.0)
CREATININE: 0.73 mg/dL
EGFR CKD-EPI (2021) MALE: >90 mL/min/{1.73_m2} (ref >=60)
GLUCOSE RANDOM: 60 mg/dL — ABNORMAL LOW (ref 70–99)
POTASSIUM: 4.6 mmol/L (ref 3.4–4.8)
SODIUM: 132 mmol/L — ABNORMAL LOW (ref 135–145)

## 2020-06-25 LAB — MAGNESIUM: MAGNESIUM: 1.7 mg/dL (ref 1.6–2.6)

## 2020-06-27 NOTE — Unmapped (Signed)
New diagnosis at last hospital stay, presented in afib w RVR, asx, thought to be due to alcohol withdrawal  Admits he missed his meds last night due to heavy drinking, P 120s  Pulse Readings from Last 2 Encounters:   06/25/20 84   06/24/20 120   Echo completed 5/5, limited but normal LVEF   chads2vasc score = 1 for h/o HTN, low-mod risk for stroke  Consider discussion of antiplatelet therapy
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
## 2020-06-27 NOTE — Unmapped (Signed)
BP Readings from Last 2 Encounters:   06/25/20 120/68   06/24/20 179/107   BP elevated on 6/6 but I didn't add back any medication as he didn't take Diltiazem 360  Poor adherence to meds, patient appears confused about his medications,  Reports partner fills his pillbox
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
## 2020-07-02 NOTE — Unmapped (Signed)
Patient wife called with a concern that the patients left leg is bigger than the right and they are red and swollen. She noticed the difference on Saturday. She says that he is on all of his medication and she doesn't know if the swelling is coming from his medication but his legs are twice as big as normal.    She told her husband tht she thinks tht he needs to go to the ER but he wanted to know what you thought first. She is on her way to work now but her son is there with him. He can be reached at [phone]. She  Can be reached at [phone] (cell) or [phone] (wk) she teaches Pre-K so you will probably be able to et her on her work number.

## 2020-07-02 NOTE — Unmapped (Signed)
Spoke with [name].  Patient still asleep.  He has been sleeping a lot more and his legs are much more swollen x 2-3 days.  Decreased appetite.  Able to ambulate to the BR but brief.  Still drinking alcohol.     Suspect he is heading towards severe hyponatremia again like in late Feb (Serum Sodium was down to 111).     Would recommend ED evaluation as soon as possible to recheck BMP.  Nicholas Rush voiced understanding, he will talk to his mom about taking him today

## 2020-07-03 ENCOUNTER — Emergency Department
Admit: 2020-07-03 | Discharge: 2020-07-03 | Disposition: A | Discharge status: Home / Self Care | Payer: MEDICAID | Attending: Emergency Medicine

## 2020-07-03 ENCOUNTER — Ambulatory Visit
Admit: 2020-07-03 | Discharge: 2020-07-03 | Disposition: A | Discharge status: Home / Self Care | Payer: MEDICAID | Attending: Emergency Medicine

## 2020-07-03 LAB — CBC W/ AUTO DIFF
BASOPHILS ABSOLUTE COUNT: 0.2 10*9/L — ABNORMAL HIGH (ref 0.0–0.1)
BASOPHILS RELATIVE PERCENT: 3.1 %
EOSINOPHILS ABSOLUTE COUNT: 0.2 10*9/L (ref 0.0–0.5)
EOSINOPHILS RELATIVE PERCENT: 4.3 %
HEMATOCRIT: 37.8 % — ABNORMAL LOW (ref 39.0–48.0)
HEMOGLOBIN: 13.1 g/dL (ref 12.9–16.5)
LYMPHOCYTES ABSOLUTE COUNT: 1.6 10*9/L (ref 1.1–3.6)
LYMPHOCYTES RELATIVE PERCENT: 30.8 %
MEAN CORPUSCULAR HEMOGLOBIN CONC: 34.7 g/dL (ref 32.0–36.0)
MEAN CORPUSCULAR HEMOGLOBIN: 36.4 pg — ABNORMAL HIGH (ref 25.9–32.4)
MEAN CORPUSCULAR VOLUME: 105 fL — ABNORMAL HIGH (ref 77.6–95.7)
MEAN PLATELET VOLUME: 7.9 fL (ref 6.8–10.7)
MONOCYTES ABSOLUTE COUNT: 0.8 10*9/L (ref 0.3–0.8)
MONOCYTES RELATIVE PERCENT: 15.2 %
NEUTROPHILS ABSOLUTE COUNT: 2.4 10*9/L (ref 1.8–7.8)
NEUTROPHILS RELATIVE PERCENT: 46.6 %
NUCLEATED RED BLOOD CELLS: 0 /100{WBCs} (ref <=4)
PLATELET COUNT: 134 10*9/L — ABNORMAL LOW (ref 150–450)
RED BLOOD CELL COUNT: 3.61 10*12/L — ABNORMAL LOW (ref 4.26–5.60)
RED CELL DISTRIBUTION WIDTH: 15.8 % — ABNORMAL HIGH (ref 12.2–15.2)
WBC ADJUSTED: 5.2 10*9/L (ref 3.6–11.2)

## 2020-07-03 LAB — COMPREHENSIVE METABOLIC PANEL
ALBUMIN: 3.1 g/dL — ABNORMAL LOW (ref 3.4–5.0)
ALKALINE PHOSPHATASE: 99 U/L (ref 46–116)
ALT (SGPT): 14 U/L (ref 10–49)
ANION GAP: 10 mmol/L (ref 5–14)
AST (SGOT): 38 U/L — ABNORMAL HIGH (ref <=34)
BILIRUBIN TOTAL: 0.3 mg/dL (ref 0.3–1.2)
BLOOD UREA NITROGEN: 6 mg/dL — ABNORMAL LOW (ref 9–23)
BUN / CREAT RATIO: 8
CALCIUM: 8.5 mg/dL — ABNORMAL LOW (ref 8.7–10.4)
CHLORIDE: 94 mmol/L — ABNORMAL LOW (ref 98–107)
CO2: 24.1 mmol/L (ref 20.0–31.0)
CREATININE: 0.75 mg/dL
EGFR CKD-EPI (2021) MALE: >90 mL/min/{1.73_m2} (ref >=60)
GLUCOSE RANDOM: 113 mg/dL (ref 70–179)
POTASSIUM: 4 mmol/L (ref 3.4–4.8)
PROTEIN TOTAL: 7.5 g/dL (ref 5.7–8.2)
SODIUM: 128 mmol/L — ABNORMAL LOW (ref 135–145)

## 2020-07-03 LAB — MAGNESIUM: MAGNESIUM: 2 mg/dL (ref 1.6–2.6)

## 2020-07-03 MED ORDER — DILTIAZEM CD 360 MG CAPSULE,EXTENDED RELEASE 24 HR
ORAL_CAPSULE | ORAL | 0 refills | 30.00000 days | Status: CP
Start: 2020-07-03 — End: ?

## 2020-07-03 MED ADMIN — sodium chloride 0.9% (NS) bolus 1,000 mL: 1000 mL | INTRAVENOUS | @ 09:00:00 | Stop: 2020-07-03

## 2020-07-03 NOTE — Unmapped (Signed)
Pt brought in by EMS for bilateral lower leg swelling. Pt with redness to left lower leg that he states is new. Sent by PCP for further evaluation.

## 2020-07-03 NOTE — Unmapped (Signed)
Patient called he office for a refill.

## 2020-07-03 NOTE — Unmapped (Signed)
[facility] Emergency Department Provider Note    Patient Identification  Nicholas Rush  Patient information was obtained from patient.  History/Exam limitations: none.    ED Clinical Impression     Final diagnoses:   Leg swelling (Primary)     Initial Impression, ED Course, Assessment and Plan     Impression:     Nicholas Rush is a 55 y.o. male with a PMH of HTN, bipolar I??disorder, alcohol use??disorder c/b withdrawal seizures,??and recurrent??C??diff??infection who is presenting to the ED for evaluation 1 week of redness and swelling in his bilateral legs, ankles, and feet.    Patient is well-appearing and in NAD. VS are unremarkable. Exam reveals 2+ pitting edema from ankles to knees.       Plan for CXR, POCUS venous doppler, mag, CBC, and CMP. Will give 1L NS.    Patient's lab work-up is overall stable, some mild hyponatremia of 128, though not too far from patient's baseline, could be contributing to his leg swelling.  POCUS ultrasound shows no evidence of blood clot.  Chest x-ray also appears clear and patient has no evidence of cardiac etiology at this time.  Liver enzymes are reassuring, mild bump in AST, the patient does report he is still drinking.  Discussed with him conservative treatment this time with compression stockings, can follow-up with his physician regarding further management.  No further interventions or work-up required at this time.  Return precautions given.      Additional Medical Decision Making     I independently visualized the EKG tracing.   I independently visualized the radiology images.   I reviewed the patient's prior medical records.     Any labs and radiology results that were available during my care of the patient were independently reviewed by me and considered in my medical decision making.    Portions of this record have been created using Scientist, clinical (histocompatibility and immunogenetics). Dictation errors have been sought, but may not have been identified and corrected.  ____________________________________________    I have reviewed the triage vital signs and the nursing notes.      Patient History     Chief Complaint  Leg Swelling      HPI   Nicholas Rush is a 55 y.o. male with a PMH of HTN, bipolar I??disorder, alcohol use??disorder c/b withdrawal seizures,??and recurrent??C??diff??infection who is presenting to the ED for evaluation of leg swelling. Patient reports 1 week of redness and swelling in his bilateral legs, ankles, and feet. He notes that he has had redness and some swelling in his R foot for the last month, but in the last week it has worsened and spread to his L foot. He was referred here by his PCP to get his sodium levels checked. He is able to lay flat without SOB. He states he is not on his feet a lot. He drinks alcohol. He denies any fevers, chills, or SOB.    Past Medical History:   Diagnosis Date   ??? Alcohol withdrawal syndrome, with delirium (CMS-HCC) 05/11/2012   ??? Anxiety    ??? Bipolar 1 disorder (CMS-HCC)    ??? Depressive disorder    ??? Erectile dysfunction    ??? Esophageal reflux    ??? History of pyloric stenosis    ??? Hypertension    ??? Insomnia    ??? Psoriasis     Dermatologist - Dr. Deloria Lair in [facility]   ??? Seizure (CMS-HCC) 02/20/2019   ??? Tobacco use  1ppd       Patient Active Problem List   Diagnosis   ??? Bipolar disorder (CMS-HCC)   ??? Alcohol use disorder, moderate, dependence (CMS-HCC)   ??? Anxiety state   ??? Essential hypertension   ??? Psoriasis   ??? Psoriatic arthritis (CMS-HCC)   ??? Positive colorectal cancer screening using DNA-based stool test   ??? Recurrent falls   ??? Thrombocytopenia (CMS-HCC)   ??? Macrocytic anemia   ??? Hyponatremia   ??? Tobacco use disorder   ??? Encephalopathy   ??? Chronic midline low back pain   ??? Paroxysmal atrial fibrillation (CMS-HCC)   ??? Recurrent Clostridioides difficile infection       Past Surgical History:   Procedure Laterality Date   ??? PYLOROMYOTOMY     ??? WISDOM TOOTH EXTRACTION         No current facility-administered medications for this encounter.    Current Outpatient Medications:   ???  acetaminophen (TYLENOL) 500 MG tablet, Take 500 mg by mouth every six (6) hours as needed for pain., Disp: , Rfl:   ???  ADALIMUMAB SYRINGE CITRATE FREE 40 MG/0.4 ML, Inject the contents of 1 syringe (40 mg) under the skin every 14 days, Disp: 4 each, Rfl: 1  ???  calcium carbonate/vitamin D3 (CALCIUM 500 + D ORAL), Take 1 tablet by mouth daily., Disp: , Rfl:   ???  cetirizine (ZYRTEC) 10 MG tablet, Take 10 mg by mouth daily., Disp: , Rfl:   ???  diltiazem (CARDIZEM CD) 360 MG 24 hr capsule, Take 1 capsule (360 mg total) by mouth in the morning., Disp: 30 capsule, Rfl: 0  ???  divalproex ER (DEPAKOTE ER) 250 MG extended release 24 hr tablet, Take 3 tablets (750 mg total) by mouth nightly., Disp: 270 tablet, Rfl: 1  ???  fluticasone propionate (FLONASE) 50 mcg/actuation nasal spray, 2 sprays into each nostril daily., Disp: 16 g, Rfl: 12  ???  gabapentin (NEURONTIN) 100 MG capsule, Take 2 capsules (200 mg total) by mouth Two (2) times a day., Disp: 120 capsule, Rfl: 0  ???  magnesium oxide (MAG-OX) 400 mg (241.3 mg elemental magnesium) tablet, Take 2 tablets (800 mg total) by mouth Three (3) times a day., Disp: 180 tablet, Rfl: 0  ???  melatonin 3 mg Tab, Take 1 tablet (3 mg total) by mouth every evening., Disp: 30 tablet, Rfl: 0  ???  multivitamin,tx-iron-minerals (THERA-M) Tab, Take 1 tablet by mouth daily., Disp: 30 tablet, Rfl: 0  ???  naltrexone (DEPADE) 50 mg tablet, Take 1 tablet (50 mg total) by mouth daily., Disp: 30 tablet, Rfl: 0  ???  nicotine (NICODERM CQ) 21 mg/24 hr patch, Place 1 patch on the skin daily., Disp: 28 patch, Rfl: 5  ???  OLANZapine (ZYPREXA) 20 MG tablet, Take 1 tablet (20 mg total) by mouth nightly., Disp: 30 tablet, Rfl: 0  ???  sodium chloride 1 gram tablet, Take 1 tablet (1 g total) by mouth Three (3) times a day with a meal., Disp: 100 tablet, Rfl: 2  ???  sodium chloride 1,000 mg TbSO soluble tablet, , Disp: , Rfl:   ???  thiamine mononitrate, vit B1, 100 mg Tab tablet, Take 1 tablet (100 mg total) by mouth daily., Disp: 90 tablet, Rfl: 0    Allergies  Tramadol and Hydroxyzine    Family History   Problem Relation Age of Onset   ??? Bipolar disorder Mother    ??? Depression Sister    ??? Alcohol abuse Sister    ???  Alcohol abuse Maternal Aunt    ??? Melanoma Neg Hx    ??? Basal cell carcinoma Neg Hx    ??? Squamous cell carcinoma Neg Hx        Social History  Social History     Tobacco Use   ??? Smoking status: Current Every Day Smoker     Packs/day: 1.00     Years: 30.00     Pack years: 30.00     Types: Cigarettes   ??? Smokeless tobacco: Never Used   Vaping Use   ??? Vaping Use: Never used   Substance Use Topics   ??? Alcohol use: Yes     Comment: 3 drinks a day.   ??? Drug use: Yes     Types: Marijuana       Review of Systems  General: no fevers, chills  Cardiac: no CP, SOB, palpitations  Pulmonary: no cough, sputum production, hemoptysis  A 10 point review of systems was negative except for those previously mentioned in the HPI and above.    Physical Exam     ED Triage Vitals [07/02/20 2120]   Enc Vitals Group      BP 131/75      Heart Rate 75      SpO2 Pulse       Resp 18      Temp 36.1 ??C (97 ??F)      Temp Source Oral      SpO2 96 %      Weight       Height 1.854 m (6' 1)     Constitutional: alert and cooperative in NAD  ENT:       Head: [place]/AT       Eyes: PERRL, conjunctiva clear, sclera anicteric       Nose: no congestion       Mouth/Throat: MM moist       Neck: supple, midline trachea, no tenderness or cervical adenopathy  Cardiac: RRR, normal S1, S2, no appreciable murmurs or rubs. Distal pulses present and symmetrical B/L  Respiratory: CTA bilaterally, non-labored breathing, no stridor, wheezing or crackles  Abdomen: soft, NT, normal BS. No masses, rebound or guarding  Extremities: 2+ pitting edema from ankles to knees. LE normal with no CCE, symmetric in size and NTTP  Skin: warm and dry, no rashes or lesions  Neurologic: no gross focal neurologic deficits appreciated  Psychiatric: normal mood, affect, speech and behavior    Radiology     XR Chest Portable   Final Result      No evidence of acute cardiopulmonary pathology. No overt pulmonary edema, as clinically questioned.      ====================   ADDENDUM (07/03/2020 6:36 AM):    On review, the following additional findings were noted:      Mildly distended bowel loops in the upper abdomen, incompletely evaluated.      ED POCUS Venous Doppler Lower Extremity Bilateral    (Results Pending)     I independently visualized these images.        Documentation assistance was provided by Dayna Barker and Cherly Hensen, Scribes, on July 03, 2020 at 04:07 for Irven Baltimore, MD.    Documentation assistance was provided by the scribe in my presence.  The documentation recorded by the scribe has been reviewed by me and accurately reflects the services I personally performed.       Harriet Pho, MD  07/05/20 2119

## 2020-07-05 NOTE — Unmapped (Signed)
Patient called and left a message stating that we was seen in the ER as Dr. Cathie Hoops requested. He said that they checked him out and didn't find anything wrong, although they did suggest that he may need some lasix to help with the swelling. Patient wants to know if you will send him a prescription for the lasix.    Marland Kitchen

## 2020-07-07 ENCOUNTER — Ambulatory Visit: Admit: 2020-07-07 | Discharge: 2020-07-20 | Disposition: A | Discharge status: Rehabilitation Facility | Payer: MEDICAID

## 2020-07-07 LAB — COMPREHENSIVE METABOLIC PANEL
ALBUMIN: 2.9 g/dL — ABNORMAL LOW (ref 3.4–5.0)
ALKALINE PHOSPHATASE: 95 U/L (ref 46–116)
ALT (SGPT): 16 U/L (ref 10–49)
ANION GAP: 7 mmol/L (ref 5–14)
AST (SGOT): 50 U/L — ABNORMAL HIGH (ref <=34)
BILIRUBIN TOTAL: 0.3 mg/dL (ref 0.3–1.2)
BLOOD UREA NITROGEN: 5 mg/dL — ABNORMAL LOW (ref 9–23)
BUN / CREAT RATIO: 6
CALCIUM: 8.2 mg/dL — ABNORMAL LOW (ref 8.7–10.4)
CHLORIDE: 100 mmol/L (ref 98–107)
CO2: 26 mmol/L (ref 20.0–31.0)
CREATININE: 0.81 mg/dL
EGFR CKD-EPI (2021) MALE: >90 mL/min/{1.73_m2} (ref >=60)
GLUCOSE RANDOM: 83 mg/dL (ref 70–179)
POTASSIUM: 4 mmol/L (ref 3.4–4.8)
PROTEIN TOTAL: 6.8 g/dL (ref 5.7–8.2)
SODIUM: 133 mmol/L — ABNORMAL LOW (ref 135–145)

## 2020-07-07 LAB — CBC W/ AUTO DIFF
BASOPHILS ABSOLUTE COUNT: 0 10*9/L (ref 0.0–0.1)
BASOPHILS RELATIVE PERCENT: 0.7 %
EOSINOPHILS ABSOLUTE COUNT: 0.2 10*9/L (ref 0.0–0.5)
EOSINOPHILS RELATIVE PERCENT: 3.5 %
HEMOGLOBIN: 13.6 g/dL (ref 12.9–16.5)
LYMPHOCYTES ABSOLUTE COUNT: 1.9 10*9/L (ref 1.1–3.6)
LYMPHOCYTES RELATIVE PERCENT: 30.2 %
MEAN PLATELET VOLUME: 7.5 fL (ref 6.8–10.7)
MONOCYTES ABSOLUTE COUNT: 0.7 10*9/L (ref 0.3–0.8)
MONOCYTES RELATIVE PERCENT: 11.4 %
NEUTROPHILS ABSOLUTE COUNT: 3.4 10*9/L (ref 1.8–7.8)
NEUTROPHILS RELATIVE PERCENT: 54.2 %
PLATELET COUNT: 127 10*9/L — ABNORMAL LOW (ref 150–450)
WBC ADJUSTED: 6.3 10*9/L (ref 3.6–11.2)

## 2020-07-07 LAB — GREEN LITHIUM HEPARIN EXTRA TUBE

## 2020-07-07 LAB — URINALYSIS WITH CULTURE REFLEX
BACTERIA: NONE SEEN /HPF
BILIRUBIN UA: NEGATIVE
BLOOD UA: NEGATIVE
GLUCOSE UA: NEGATIVE
KETONES UA: NEGATIVE
LEUKOCYTE ESTERASE UA: NEGATIVE
NITRITE UA: NEGATIVE
PH UA: 7 (ref 5.0–9.0)
PROTEIN UA: NEGATIVE
RBC UA: <1 /HPF (ref <=3)
SPECIFIC GRAVITY UA: 1.007 (ref 1.003–1.030)
SQUAMOUS EPITHELIAL: <1 /HPF (ref 0–5)
UROBILINOGEN UA: <2
WBC UA: <1 /HPF (ref <=2)

## 2020-07-07 LAB — TOXICOLOGY SCREEN, URINE
AMPHETAMINE SCREEN URINE: NEGATIVE
BARBITURATE SCREEN URINE: NEGATIVE
BENZODIAZEPINE SCREEN, URINE: NEGATIVE
BUPRENORPHINE, URINE SCREEN: NEGATIVE
CANNABINOID SCREEN URINE: NEGATIVE
COCAINE(METAB.)SCREEN, URINE: NEGATIVE
FENTANYL SCREEN, URINE: NEGATIVE
METHADONE SCREEN, URINE: NEGATIVE
OPIATE SCREEN URINE: NEGATIVE
OXYCODONE SCREEN URINE: NEGATIVE

## 2020-07-07 LAB — ETHANOL: ETHANOL: 295.6 mg/dL — ABNORMAL HIGH (ref <=10.0)

## 2020-07-07 LAB — VALPROIC ACID LEVEL, TOTAL: VALPROIC ACID TOTAL: 47.8 ug/mL — ABNORMAL LOW (ref 50.0–100.0)

## 2020-07-08 LAB — COMPREHENSIVE METABOLIC PANEL
ALBUMIN: 2.9 g/dL — ABNORMAL LOW (ref 3.4–5.0)
ALKALINE PHOSPHATASE: 91 U/L (ref 46–116)
ALT (SGPT): 23 U/L (ref 10–49)
ANION GAP: 7 mmol/L (ref 5–14)
AST (SGOT): 48 U/L — ABNORMAL HIGH (ref <=34)
BILIRUBIN TOTAL: 0.3 mg/dL (ref 0.3–1.2)
BLOOD UREA NITROGEN: 5 mg/dL — ABNORMAL LOW (ref 9–23)
BUN / CREAT RATIO: 7
CALCIUM: 8.5 mg/dL — ABNORMAL LOW (ref 8.7–10.4)
CHLORIDE: 102 mmol/L (ref 98–107)
CO2: 27 mmol/L (ref 20.0–31.0)
CREATININE: 0.71 mg/dL
EGFR CKD-EPI (2021) MALE: >90 mL/min/{1.73_m2} (ref >=60)
GLUCOSE RANDOM: 68 mg/dL — ABNORMAL LOW (ref 70–179)
POTASSIUM: 4.1 mmol/L (ref 3.4–4.8)
PROTEIN TOTAL: 6.8 g/dL (ref 5.7–8.2)
SODIUM: 136 mmol/L (ref 135–145)

## 2020-07-08 LAB — CBC
HEMATOCRIT: 37 % — ABNORMAL LOW (ref 39.0–48.0)
HEMOGLOBIN: 13.5 g/dL (ref 12.9–16.5)
MEAN CORPUSCULAR HEMOGLOBIN CONC: 36.5 g/dL — ABNORMAL HIGH (ref 32.0–36.0)
MEAN CORPUSCULAR HEMOGLOBIN: 39.4 pg — ABNORMAL HIGH (ref 25.9–32.4)
MEAN CORPUSCULAR VOLUME: 108 fL — ABNORMAL HIGH (ref 77.6–95.7)
MEAN PLATELET VOLUME: 7.5 fL (ref 6.8–10.7)
PLATELET COUNT: 143 10*9/L — ABNORMAL LOW (ref 150–450)
RED BLOOD CELL COUNT: 3.43 10*12/L — ABNORMAL LOW (ref 4.26–5.60)
RED CELL DISTRIBUTION WIDTH: 15.5 % — ABNORMAL HIGH (ref 12.2–15.2)
WBC ADJUSTED: 5 10*9/L (ref 3.6–11.2)

## 2020-07-08 LAB — VITAMIN B12: VITAMIN B-12: 680 pg/mL (ref 211–911)

## 2020-07-08 LAB — TSH: THYROID STIMULATING HORMONE: 0.671 u[IU]/mL (ref 0.550–4.780)

## 2020-07-08 LAB — PHOSPHORUS: PHOSPHORUS: 3.3 mg/dL (ref 2.4–5.1)

## 2020-07-08 LAB — MAGNESIUM: MAGNESIUM: 1.8 mg/dL (ref 1.6–2.6)

## 2020-07-08 LAB — FOLATE: FOLATE: >24 ng/mL (ref >=5.4)

## 2020-07-08 LAB — B-TYPE NATRIURETIC PEPTIDE: B-TYPE NATRIURETIC PEPTIDE: 81.6 pg/mL (ref <=100)

## 2020-07-08 MED ADMIN — nicotine (NICODERM CQ) 21 mg/24 hr patch 1 patch: 1 | TRANSDERMAL | @ 13:00:00

## 2020-07-08 MED ADMIN — LORazepam (ATIVAN) injection 2 mg: 2 mg | INTRAVENOUS | @ 13:00:00

## 2020-07-08 MED ADMIN — furosemide (LASIX) tablet 20 mg: 20 mg | ORAL | @ 18:00:00

## 2020-07-08 MED ADMIN — divalproex ER (DEPAKOTE ER) extended release 24 hr tablet 750 mg: 750 mg | ORAL | @ 09:00:00

## 2020-07-08 MED ADMIN — enoxaparin (LOVENOX) syringe 40 mg: 40 mg | SUBCUTANEOUS | @ 13:00:00

## 2020-07-08 MED ADMIN — sodium chloride tablet 1 g: 1 g | ORAL | @ 17:00:00

## 2020-07-08 MED ADMIN — sodium chloride tablet 1 g: 1 g | ORAL | @ 20:00:00

## 2020-07-08 MED ADMIN — magnesium oxide (MAG-OX) tablet 800 mg: 800 mg | ORAL | @ 18:00:00

## 2020-07-08 MED ADMIN — thiamine mononitrate (vit B1) tablet 100 mg: 100 mg | ORAL | @ 13:00:00

## 2020-07-08 MED ADMIN — dilTIAZem (CARDIZEM CD) 24 hr capsule 360 mg: 360 mg | ORAL | @ 14:00:00

## 2020-07-08 MED ADMIN — magnesium oxide (MAG-OX) tablet 800 mg: 800 mg | ORAL | @ 13:00:00

## 2020-07-08 MED ADMIN — sodium chloride tablet 1 g: 1 g | ORAL | @ 13:00:00

## 2020-07-08 MED ADMIN — folic acid (FOLVITE) tablet 1 mg: 1 mg | ORAL | @ 13:00:00

## 2020-07-08 MED ADMIN — gabapentin (NEURONTIN) capsule 200 mg: 200 mg | ORAL | @ 13:00:00

## 2020-07-08 MED ADMIN — lactated ringers bolus 1,000 mL: 1000 mL | INTRAVENOUS | @ 09:00:00 | Stop: 2020-07-08

## 2020-07-08 MED ADMIN — thiamine mononitrate (vit B1) tablet 500 mg: 500 mg | ORAL | @ 03:00:00 | Stop: 2020-07-07

## 2020-07-08 NOTE — Unmapped (Signed)
Writer received voicemail from wife [place] requesting for Dr. Cathie Hoops to please call them back as they have been trying to get in touch with the doctor. Writer returned call and transferred to [employer] as this is where the doctor is located at.

## 2020-07-08 NOTE — Unmapped (Unsigned)
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    RSV Negative Negative   Urinalysis with Culture Reflex    Collection Time: 07/07/20  6:54 PM    Specimen: Clean Catch; Urine   Result Value  Ref Range    Color, UA Colorless     Clarity, UA Clear     Specific Gravity, UA 1.007 1.003 - 1.030    pH, UA 7.0 5.0 - 9.0    Leukocyte Esterase, UA Negative Negative    Nitrite, UA Negative Negative    Protein, UA Negative Negative    Glucose, UA Negative Negative    Ketones, UA Negative Negative    Urobilinogen, UA <2.0 mg/dL <1.6 mg/dL    Bilirubin, UA Negative Negative    Blood, UA Negative Negative    RBC, UA <1 <=3 /HPF    WBC, UA <1 <=2 /HPF    Squam Epithel, UA <1 0 - 5 /HPF    Bacteria, UA None Seen None Seen /HPF   Toxicology Screen, Urine    Collection Time: 07/07/20  6:54 PM   Result Value Ref Range    Amphetamines Screen, Ur Negative <500 ng/mL    Barbiturates Screen, Ur Negative <200 ng/mL    Benzodiazepines Screen, Urine Negative <200 ng/mL    Cannabinoids Screen, Ur Negative <20 ng/mL    Methadone Screen, Urine Negative <300 ng/mL    Cocaine(Metab.)Screen, Urine Negative <150 ng/mL    Opiates Screen, Ur Negative <300 ng/mL    Fentanyl Screen, Ur Negative <1.0 ng/mL    Oxycodone Screen, Ur Negative <100 ng/mL    Buprenorphine, Urine Negative <5 ng/mL   ECG 12 lead (Adult)    Collection Time: 07/07/20  7:55 PM   Result Value Ref Range    EKG Systolic BP  mmHg    EKG Diastolic BP  mmHg    EKG Ventricular Rate 71 BPM    EKG Atrial Rate 71 BPM    EKG P-R Interval 136 ms    EKG QRS Duration 86 ms    EKG Q-T Interval 418 ms EKG QTC Calculation 454 ms    EKG Calculated P Axis 10 degrees    EKG Calculated R Axis 35 degrees    EKG Calculated T Axis 15 degrees    QTC Fredericia 442 ms   Comprehensive metabolic panel    Collection Time: 07/08/20  4:34 AM   Result Value Ref Range    Sodium 136 135 - 145 mmol/L    Potassium 4.1 3.4 - 4.8 mmol/L    Chloride 102 98 - 107 mmol/L    CO2 27.0 20.0 - 31.0 mmol/L    Anion Gap 7 5 - 14 mmol/L    BUN 5 (L) 9 - 23 mg/dL    Creatinine 1.09 6.04 - 1.10 mg/dL    BUN/Creatinine Ratio 7     eGFR CKD-EPI (2021) Male >90 >=60 mL/min/1.62m2    Glucose 68 (L) 70 - 179 mg/dL    Calcium 8.5 (L) 8.7 - 10.4 mg/dL    Albumin 2.9 (L) 3.4 - 5.0 g/dL    Total Protein 6.8 5.7 - 8.2 g/dL    Total Bilirubin 0.3 0.3 - 1.2 mg/dL    AST 48 (H) <=54 U/L    ALT 23 10 - 49 U/L    Alkaline Phosphatase 91 46 - 116 U/L   CBC    Collection Time: 07/08/20  4:34 AM   Result Value Ref Range    WBC 5.0 3.6 - 11.2 10*9/L    RBC 3.43 (L) 4.26 - 5.60 10*12/L    HGB 13.5 12.9 - 16.5 g/dL    HCT 09.8 (L) 11.9 - 48.0 %    MCV 108.0 (H) 77.6 - 95.7 fL  MCH 39.4 (H) 25.9 - 32.4 pg    MCHC 36.5 (H) 32.0 - 36.0 g/dL    RDW 16.1 (H) 09.6 - 15.2 %    MPV 7.5 6.8 - 10.7 fL    Platelet 143 (L) 150 - 450 10*9/L   Magnesium Level    Collection Time: 07/08/20  4:34 AM   Result Value Ref Range    Magnesium 1.8 1.6 - 2.6 mg/dL   Phosphorus Level    Collection Time: 07/08/20  4:34 AM   Result Value Ref Range    Phosphorus 3.3 2.4 - 5.1 mg/dL     Imaging: None    Psychometrics:   Psych Scale Scores - Adult    Flowsheet Row Office Visit from 04/11/2020 in [facility] AT [facility]   WHODAS 2.0 (Self-administered) - Total Score 33 Collected on 04/11/2020 0001      This visit was completed via video.    {    Coding tips - Do not edit this text, it Jacksyn Beeks delete upon signing of note!    ?? Telephone visits ([phone] for Physicians and APP??s and [phone] for Non- Physician Clinicians)- Only use minutes on the phone to determine level of service.    ?? Video visits [phone]) - Use both minutes on video and pre/post minutes to determine level of service.       :[id number]}    The patient reports they are currently: at home. I spent 30 minutes on the real-time audio and video with the patient on the date of service. I spent an additional 15 minutes on pre- and post-visit activities on the date of service.     The patient was physically located in [place] or a state in which I am permitted to provide care. The patient and/or parent/guardian understood that s/he may incur co-pays and cost sharing, and agreed to the telemedicine visit. The visit was reasonable and appropriate under the circumstances given the patient's presentation at the time.    The patient and/or parent/guardian has been advised of the potential risks and limitations of this mode of treatment (including, but not limited to, the absence of in-person examination) and has agreed to be treated using telemedicine. The patient's/patient's family's questions regarding telemedicine have been answered.     If the visit was completed in an ambulatory setting, the patient and/or parent/guardian has also been advised to contact their provider???s office for worsening conditions, and seek emergency medical treatment and/or call 911 if the patient deems either necessary.         Maryan Char, MD

## 2020-07-08 NOTE — Unmapped (Signed)
Social Work  Psychosocial Assessment    Patient Name: Nicholas Rush   Medical Record Number: [id number]   Date of Birth: 06/24/1965  Sex: Male     From H and P: Nicholas Rush is a 55 y.o. 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     Patient is being tested for C Diff.  SW spoke with patient's wife by telephone.     Patient is familiar to [facility].      He lives with his wife and step children -- a son (42 years old) and a daughter (39).  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  His wife is working on Disability for him.  The wife and daughter both work at a daycare.    Per assessment conducted on 05/20/20:  Per wife, the patient's condition has declined dramatically over the past 2 years.  The patient has always been a drinker, but this worsened during COVID when the patient found himself out of work.  The patient's wife stated he drinks a large amount of liquor each day but typically mixes this into [facility].  She has tried to discourage his drinking and has, at times, refused to buy it for him; however, he is very demanding in wanting alcohol.  The patient's wife says she finds it is easier to give into his demands for alcohol. The patient has been to several detox facilities and completed treatment at [facility] in [place].  He has always returned to alcohol, however.    Since his last admission the patient has shown some improvement in his cognition and ability to ambulate.  While still very limited, he is able to get himself to the bathroom. Per wife, the patient has had diarrhea recently and is able to get to the bathroom but makes a mess when he gets there.  The wife stated the patient sleeps close to 20 hours most days and drinks anywhere from a pint to a fifth of bourbon everyday.  He also drinks Fireballs regularly and slammed two of these before the ambulance came to get him.      The patient discharged home from previous stay with home health services.  Patient saw home health a few times then stopped answering the phone when they'd call.      SW will continue to follow.         Referral  Referred by: Care Manager  Reason for Referral: Substance Abuse Issues    Extended Emergency Contact Information  Primary Emergency Contact: Bisbee,[name]  Address: [address] [place], [place] [address] [place] [employer] Phone: [phone]  Relation: Spouse  Preferred language: ENGLISH  Interpreter needed? No    Legal Next of Kin / Guardian / POA / Advance Directives    HCDM (patient stated preference): Bisbee,[name] - Spouse - [phone]    Advance Directive (Medical Treatment)  Does patient have an advance directive covering medical treatment?: Patient has advance directive covering medical treatment, copy not in chart. (Living Will.Per patient wife HCPOA)  Reason patient does not have an advance directive covering medical treatment:: Patient does not wish to complete one at this time.    Health Care Decision Maker [HCDM] (Medical & Mental Health Treatment)  Healthcare Decision Maker: HCDM documented in  the HCDM/Contact Info section.  Information offered on HCDM, Medical & Mental Health advance directives:: Patient given information. (spouse- christie in chart)    Advance Directive (Mental Health Treatment)  Does patient have an advance directive covering mental health treatment?: Patient does not have advance directive covering mental health treatment.  Reason patient does not have an advance directive covering mental health treatment:: Patient does not wish to complete one at this time.    Discharge Planning  See CM Initial Transition Planning Assesssment for Discharge Planning Information.    Social Determinants of Health  Social Determinants of Health     Tobacco Use: High Risk   ??? Smoking Tobacco Use: Current Every Day Smoker   ??? Smokeless Tobacco Use: Never Used   Alcohol Use: Heavy Drinker   ??? How often do you have a drink containing alcohol?: 4+ times per week   ??? How many drinks containing alcohol do you have on a typical day when you are drinking?: 5 - 6   ??? How often do you have 5 or more drinks on one occasion?: Daily or almost daily   Financial Resource Strain: Medium Risk   ??? Difficulty of Paying Living Expenses: Somewhat hard   Food Insecurity: No Food Insecurity   ??? Worried About Running Out of Food in the Last Year: Never true   ??? Ran Out of Food in the Last Year: Never true   Transportation Needs: Unmet Transportation Needs   ??? Lack of Transportation (Medical): Yes   ??? Lack of Transportation (Non-Medical): Yes   Physical Activity: Not on file   Stress: Not on file   Social Connections: Not on file   Intimate Partner Violence: Not on file   Depression: At risk   ??? PHQ-2 Score: 5   Housing/Utilities: Low Risk    ??? Within the past 12 months, have you ever stayed: outside, in a car, in a tent, in an overnight shelter, or temporarily in someone else's home (i.e. couch-surfing)?: No   ??? Are you worried about losing your housing?: No   ??? 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

## 2020-07-08 NOTE — Unmapped (Signed)
Pt admitted to CCU at 0610. VSS, admission and assessment completed. CIWA score 3 at this time. Enteric precautions initiated for C.diff rule out as he had recent infection but states he has not had diarrhea in 2 days. Pt oriented to room and indicated understanding of call bell usage. He has no further questions at this time.    Problem: Adult Inpatient Plan of Care  Goal: Plan of Care Review  Outcome: Progressing

## 2020-07-08 NOTE — Unmapped (Signed)
Bed: 80-D  Expected date:   Expected time:   Means of arrival:   Comments:  Ems seizure

## 2020-07-08 NOTE — Unmapped (Signed)
[facility]  Emergency Department Provider Note       Impression, ED Course, Assessment and Plan     Impression:  Nicholas Rush is a 55 y.o. 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    BP 123/77  - Pulse 64  - Temp 36.4 ??C (97.5 ??F) (Axillary)  - Resp 16  - SpO2 96%      Patient is disheveled and dirty.  Vital signs within normal limits.  Patient oriented x3. Left pupil is 4 mm and reactive, right pupil is 2 mm and reactive per baseline.  Normal speech and language. No gross focal neurologic deficits are appreciated. Patient is moving all extremities equally, face is symmetric at rest and with speech.  He has 3+ pitting edema bilateral lower extremities.  Lungs with rhonchi and scattered wheezing.    Concern for alcohol withdrawal seizure    Patient signed out to oncoming provider pending remainder of laboratory evaluation          History     Nicholas Rush is a 55 y.o. 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  Patient reports he has had a fever at home for the past few days, unable to locate source.  Denies shortness of breath, cough, dysuria, rash.  Today he had a 3 to 5-minute grand mal seizure on the couch per report of his wife.  She states he was confused afterwards.  Patient denies biting his tongue, bowel or bladder incontinence.  His wife states that patient drinks 0.5-1 bottle of whiskey per day but recently stopped drinking because he has been sleeping so much.  She states has been sleeping approximately 20 hours/day.  Patient tells me that he only drinks on the weekends.  He additionally complains of bilateral lower extremity swelling which has been increasing over the past 2 weeks.  He additionally states that he has recurring C. difficile and has been having multiple episodes of diarrhea daily.    Past Medical History:   Diagnosis Date   ??? Alcohol withdrawal syndrome, with delirium (CMS-HCC) 05/11/2012   ??? Anxiety    ??? Bipolar 1 disorder (CMS-HCC)    ??? Depressive disorder    ??? Erectile dysfunction    ??? Esophageal reflux    ??? History of pyloric stenosis    ??? Hypertension    ??? Insomnia    ??? Psoriasis     Dermatologist - Dr. Deloria Lair in [facility]   ??? Seizure (CMS-HCC) 02/20/2019   ??? Tobacco use     1ppd       Past Surgical History:   Procedure Laterality Date   ??? PYLOROMYOTOMY     ??? WISDOM TOOTH EXTRACTION         No current facility-administered medications for this encounter.    Current Outpatient Medications:   ???  acetaminophen (TYLENOL) 500 MG tablet, Take 500 mg by mouth every six (6) hours as needed for pain., Disp: , Rfl:   ???  ADALIMUMAB SYRINGE CITRATE FREE 40 MG/0.4 ML, Inject the contents of 1 syringe (40 mg) under the skin every 14 days, Disp: 4 each, Rfl: 1  ???  calcium carbonate/vitamin D3 (CALCIUM 500 + D ORAL), Take 1 tablet by mouth daily., Disp: , Rfl:   ???  cetirizine (ZYRTEC) 10 MG tablet, Take 10 mg by mouth daily., Disp: , Rfl:   ???  diltiazem (CARDIZEM CD) 360 MG 24 hr capsule, Take 1 capsule (360 mg total) by mouth in the morning., Disp: 30 capsule, Rfl: 0  ???  divalproex ER (DEPAKOTE ER) 250 MG extended release 24 hr tablet, Take 3 tablets (750 mg total) by mouth nightly., Disp: 270 tablet, Rfl: 1  ???  fluticasone propionate (FLONASE) 50 mcg/actuation nasal spray, 2 sprays into each nostril daily., Disp: 16 g, Rfl: 12  ???  gabapentin (NEURONTIN) 100 MG capsule, Take 2 capsules (200 mg total) by mouth Two (2) times a day., Disp: 120 capsule, Rfl: 0  ???  magnesium oxide (MAG-OX) 400 mg (241.3 mg elemental magnesium) tablet, Take 2 tablets (800 mg total) by mouth Three (3) times a day., Disp: 180 tablet, Rfl: 0  ???  melatonin 3 mg Tab, Take 1 tablet (3 mg total) by mouth every evening., Disp: 30 tablet, Rfl: 0  ??? multivitamin,tx-iron-minerals (THERA-M) Tab, Take 1 tablet by mouth daily., Disp: 30 tablet, Rfl: 0  ???  naltrexone (DEPADE) 50 mg tablet, Take 1 tablet (50 mg total) by mouth daily., Disp: 30 tablet, Rfl: 0  ???  nicotine (NICODERM CQ) 21 mg/24 hr patch, Place 1 patch on the skin daily., Disp: 28 patch, Rfl: 5  ???  OLANZapine (ZYPREXA) 20 MG tablet, Take 1 tablet (20 mg total) by mouth nightly., Disp: 30 tablet, Rfl: 0  ???  sodium chloride 1 gram tablet, Take 1 tablet (1 g total) by mouth Three (3) times a day with a meal., Disp: 100 tablet, Rfl: 2  ???  sodium chloride 1,000 mg TbSO soluble tablet, , Disp: , Rfl:   ???  thiamine mononitrate, vit B1, 100 mg Tab tablet, Take 1 tablet (100 mg total) by mouth daily., Disp: 90 tablet, Rfl: 0    Allergies  Tramadol and Hydroxyzine    Family History   Problem Relation Age of Onset   ??? Bipolar disorder Mother    ??? Depression Sister    ??? Alcohol abuse Sister    ??? Alcohol abuse Maternal Aunt    ??? Melanoma Neg Hx    ??? Basal cell carcinoma Neg Hx    ??? Squamous cell carcinoma Neg Hx        Social History  Social History     Tobacco Use   ??? Smoking status: Current Every Day Smoker     Packs/day: 1.00     Years: 30.00     Pack years: 30.00     Types: Cigarettes   ??? Smokeless tobacco: Never Used   Vaping Use   ??? Vaping Use: Never used   Substance Use Topics   ??? Alcohol use: Yes     Comment: 3 drinks a day.   ??? Drug use: Yes     Types: Marijuana         Review of Systems    Constitutional: Positive for fever.  Eyes: Negative for visual changes.  ENT: Negative for sore throat.  Cardiovascular: Negative for chest pain.  Respiratory: Negative for shortness of breath.  Gastrointestinal: Negative for abdominal pain, vomiting or diarrhea.  Genitourinary: Negative for dysuria.  Musculoskeletal: Negative for back pain.  Skin: Negative for rash.  Neurological: Negative for headaches, focal weakness or numbness.     Physical Exam     Vitals:    07/07/20 1740   BP: 123/77   Pulse: 64   Resp: 16 Temp: 36.4 ??C (97.5 ??F)   TempSrc: Axillary   SpO2: 96%  Constitutional: Disheveled, dirty, chronically ill-appearing  Eyes: Conjunctivae are normal.  HEENT: Normocephalic and atraumatic. Nares patent   Cardiovascular: See heart rate listed above, regular rhythm. Normal skin perfusion  Respiratory: Normal respiratory effort and rate. Speaking easily in full sentences  Gastrointestinal: Soft, non-tender, non-distended.  Musculoskeletal: Nontender with normal range of motion in all extremities.        Right lower leg: 3+ pitting edema.       Left lower leg: 3+ pitting edema  Neurologic: Left pupil is 4 mm and reactive, right pupil is 2 mm and reactive per baseline.  Normal speech and language. No gross focal neurologic deficits are appreciated. Patient is moving all extremities equally, face is symmetric at rest and with speech.   Skin: Skin is warm, dry and intact.  Psychiatric: Mood and affect are normal. Speech and behavior are normal.     Radiology     XR Chest Portable    (Results Pending)        Laboratory Data     Lab Results   Component Value Date    WBC 5.2 07/03/2020    HGB 13.1 07/03/2020    HCT 37.8 (L) 07/03/2020    PLT 134 (L) 07/03/2020       Lab Results   Component Value Date    NA 128 (L) 07/03/2020    K 4.0 07/03/2020    CL 94 (L) 07/03/2020    CO2 24.1 07/03/2020    BUN 6 (L) 07/03/2020    CREATININE 0.75 07/03/2020    GLU 113 07/03/2020    CALCIUM 8.5 (L) 07/03/2020    MG 2.0 07/03/2020    PHOS 3.4 05/20/2020       Lab Results   Component Value Date    BILITOT 0.3 07/03/2020    BILIDIR 0.20 10/28/2019    PROT 7.5 07/03/2020    ALBUMIN 3.1 (L) 07/03/2020    ALT 14 07/03/2020    AST 38 (H) 07/03/2020    ALKPHOS 99 07/03/2020       Lab Results   Component Value Date    INR 0.97 02/23/2020    APTT 36.4 02/23/2020       Portions of this record have been created using [employer]. Dictation errors have been sought, but may not have been identified and corrected.       Carmelia Bake., MD  Resident  07/08/20 [phone]

## 2020-07-08 NOTE — Unmapped (Addendum)
Collateral from wife [phone]), [name], [phone], trying to get into ASAP, [facility], tried SNF with PT in the past but ends up home with [facility], last time was different and he was able to talk around, only uses alcohol, wife been around for 7 years and lived with him for the past 4- didn't know anything about the ETOH for the first 2 years, handle of gin in 24h > now a pint a day with fireball x2 airplane bottles, sleeping 18h a day (max of 10-12), legs got really swollen 1 week ago but sent home, was taking 50 mg naltrexone and would like to continue, unsure if the vpa is helping at this dose (750, but before it 1K), completely quit ETOH in 9/21 and went until 2/22 with wife very involved, believes he is bored and depressed since 1/21 (was very energetic, but not manic- his warning signs are a lot of buying things like a yacht), no court or wedding (thought he was getting married again), normally does worse on ativan (okay on librium), has a 61 year son that can help with him during the day during the summer, [place]- [facility] to [facility] in [place] [facility]), his levels start dropping, acid reflux, only vomits about twice, 2.5 ppd         Nicholas Rush is a 55 y.o. male with a past medical history significant for HTN, bipolar I disorder, alcohol use disorder c/b withdrawal seizures, recurrent C diff infection who presents s/p witnessed seizure admitted for monitored alcohol withdrawal and new onset lower extremity edema, also found to have new onset lower extremity edema. Evaluated by PT/OT who recommend discharge to acute inpatient rehab.      ## Alcohol Use Disorder - hx complicated withdrawal  Patient with EtOH at 295 on admission. About 3 days into admission developed withdrawal symptoms including autonomic instability, tremor, hallucination, confusion, and agitation. Successfully treated scheduled and symptom triggered Ativan and had serial CIWA scoring with prn Ativan. Also treated with folate, thiamine. Discharged to [facility] on increased dose of gabapentin and home dose naltrexone. Case management consulted for sobriety resources.     ## Fever - Aspiration Pneumonia  On second day of withdrawal patient began to spike fevers. Noted to have trouble with airway secretions and was producing purulent sputum. Started on Unasyn for empiric coverage of potential aspiration pneumonia. Later concluded, fever seemed to track more with withdrawal process and appeared less likely infectious. Transitioned to augmentin to complete 5 days of antibiotics.      ## Paroxysmal Afib  Patient entered into Afib with RVR on third day of withdrawal symptoms. Unable to take PO at the time, he was started on diltiazem drip until transitioned to home diltiazem PO. Patient was consistently hypokalemic and hypomagnesemic; repleted daily. Discharged on daily PO K and Mg. Decreased home magnesium 800mg  TID to once daily due to frequent loose stools while inpatient.      ## Myotonic Clonic Seizure   Seizure at home most likely provoked seizure 2/2 alcohol or infection. Spot EEG during recent hospitalization not suggestive of underlying pathology. Neurology consult suggested that presentation was most consistent with provoked seizure and recommended titration of depakote to therapeutic level.      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 Patient had psych admission where they attempted to switch to lithium, but patient did not tolerate. He has not been stable on antipsychotic monotherapy and has  had reasonable stability with subtherapeutic dose of VPA. Continued on home salt tabs.      ## Lower Extremity Edema, resolved  New onset edema without other HF symptoms. Differential includes new onset HF, renal dysfunction (primarily proteinuria), and portal hypertension. Bedside ECHO unremarkable. UPC unremarkable. RUQ [place] mostly unremarkable and without portal HTN, but liver dysfunction could be contributory factor to hypoalbuminemia. Improved with Lasix. Recommend follow-up with PCP.      ## Diarrhea:   Patient with several c diff infections in the past. Bouts of loose stools while inpatient. C diff negative, low concern for infectious cause. Likely related to frequent magnesium oxide. Decreased dose from magnesium 800mg  TID to magnesium 800mg  daily. Also treated with prn loperamide while inpatient.      ## Bipolar I Disorder: well-controlled on Depakote and Zyprexa. VPA subtherapeutic, but did not increase due to concern for SIADH and decreasing sodium level.

## 2020-07-08 NOTE — Unmapped (Signed)
Pt had a seizure today at home. Pt has history of cdiff.

## 2020-07-08 NOTE — Unmapped (Signed)
[facility]   Care Management Progress Note             Date of Service:  07/08/2020      Service:  Care Management - phone    Purpose of contact:         Discussed the following with Nicholas Brooklyn. Hamer Rush :        Barriers to Care:  Transportation barrier: transportation needs     Provider/Care Partner(s)  to follow up on:   N/A    Health Maintenance:  Health Maintenance Due   Topic Date Due   ??? DTaP/Tdap/Td Vaccines (2 - Td or Tdap) 02/21/2019   ??? FIT-DNA Stool Test  06/17/2019     CM contacted patient introduced self and role at [facility].   CM reviewed with patient barriers to care, medication questions, problems, new/old concerning symptoms, or any other psychosocial needs.  CM spoke to patient Nicholas Rush at [phone] to discuss patient transportation needs. CM provided patient and his son with resources as it relates to transportation.   Patient son shared that patient has stopped drinking.     Additional Information/Plan:  Patient provided my direct contact information and encouraged to contact me should additional needs arise.    Minutes spent providing outreach: 18        Nicholas Rush  [employer]  [facility]

## 2020-07-08 NOTE — Unmapped (Signed)
Family Medicine Inpatient Service  History and Physical Note    Team: [facility] (pgr [id number])    PCP: Fabio Pierce, MD  Date of Admission: July 08, 2020  Code Status: full code  Emergency Contact: [name]    ASSESSMENT / PLAN:   Nicholas Rush is a 55 y.o. 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     ## Alcohol Use Disorder  ## hx complicated alcohol withdrawal  Pt had a witnessed seizure on 6/19. While initially reported that he has been cutting back, patient declines this. States he drinks ~1 pint to 1/5th nightly last drink 6/18.   - CIWAS w/ triggered Ativan > add standing as indicated  - thiamine and folate daily  - zofran PRN  - melatonin at bedtime  - home gabapentin dose 200mg  BID  - add thiamine lab, folate lab, b12 given   -patient desires detox but discharge home following stabilization    ## Myotonic Clonic Seizure   Witnessed seizure 6/19 with history of seizures with alcohol withdrawal and pt reports seizures outside of etoh withdrawal as well. S/p EEG/MRI in 11/2019 which were normal. Patient on depakote at home for bipolar and nearly therapeutic. May have been provoked by acute alcohol intoxication, infectious disease (10-day history of diarrhea), malnutrition (albumin 2.9)   - Given that patient was not withdrawing from alcohol at this time, will consult neurology for recommendations for antiepileptics or further evaluation   - Seizure precautions    ## Lower Extremity Edema  New 2week history of 3+ pitting edema to lower extremity predominantly foot and lower leg. 20lb weight gain since last admission (?accurate weight) but denies DOE/orthopnea and reassuring ECHO 2022. PVL neg 6/15. US liver 10/2019 with fatty liver but no known cirrhosis nor ascites.  Hypoalbuminemia to 2.9, fatigue, and new seizure. No protein in UA. Could also be lymphedema though no infectious or malignant sources identified at this time. Lastly, edema known side effect of valproic acid but odd to come on so acutely.    - repeat weights   - TSH   - compression and elevation   - start lasix 20mg  daily    - replace potassium as needed    ## Diarrhea: pt has a hx of diarrhea complicated by cdiff, according to previous notes, he recently stopped taking senna. His medication regimen includes magnesium which may also induce diarrhea. Reports 10-days of 2-3 episodes of diarrhea daily; no abdominal pain. Mild distention on exam without nausea or vomiting.   - C-diff assay pending     ## Bipolar I Disorder: well-controlled on Depakote and Zyprexa.    - continue depakote 750mg  at bedtime and zyprexa 20mg  qhs  ??  ##Paroxysmal atrial fibrillation Mag 1.5 on admission to the ED, EKG was sinus rhythm    - Continue diltiazem CD 360 mg daily   - magnesium oxide  800 mg three times daily for goal Mg >2    ## Hyponatremia Na 133 on presentation to the ED  - 1 gram NaCL PO  - daily labs    ## Tobacco use disorder: Patient currently smoking 1PPD. Will place inpatient consult to tobacco cessation for toxic effects of tobacco  - Tobacco cessation consult  - Nicotine replacement therapy with patch, gum, lozenge PRN    ## FEN/GI:  - Tolerating oral   - Check  electrolytes as indicated, replete as needed.  - Diet Regular    # PPX:   - DVT: SQ Lovenox    # Dispo: Step down  [ ]  Anticipated Discharge Location: Home  [ ]  PT/OT/DME: No needs anticipated, Will consider PT/OT after withdrawl  [ ]  CM/SW needs: Substance use disorder resources  [ ]  Meds/Rx:  Not yet prescribed. No special med needs  [ ]  Teaching: Outpatient resources for sobriety  [ ]  Follow up appt: Appointment needed  [ ]  Excuse letter: None anticipated  [ ]  Transport: needs not known    This patient warrants inpatient care because of their severity of illness and the intensity of service that can only be performed in a hospital setting. This patient is expected to be hospitalized for greater than two midnights in the hospital because of intensity of services including IV therapies for complicated alcohol withdrawl . (if patient is admitted as Obs status you can delete this sentence, if you cannot justify this sentence then consider making them obs status)    HISTORY OF PRESENT ILLNESS:  Nicholas Rush is a 55 y.o. 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  Patient reports he has had a fever at home for the past few days, unable to locate source.  Denies shortness of breath, cough, dysuria, rash.  Today he had a 3 to 5-minute grand mal seizure on the couch per report of his wife.  She states he was confused afterwards.  Patient denies biting his tongue, bowel or bladder incontinence.  His wife states that patient drinks 0.5-1 bottle of whiskey per day but recently stopped drinking because he has been sleeping so much.  She states has been sleeping approximately 20 hours/day.  Patient tells me that he only drinks on the weekends.  He additionally complains of bilateral lower extremity swelling which has been increasing over the past 2 weeks.  He additionally states that he has recurring C. difficile and has been having multiple episodes of diarrhea daily.    ED Course: vitals WNL, exam notable for a baseline Left pupil 4 mm and reactive, right pupil is 2 mm and reactive per baseline. He has 3+ pitting edema bilateral lower extremities.  Lungs with rhonchi and scattered wheezing. Most of his labs were hemolyzed. Na 133, AST 50, valproic acid 47.8, ETOH 295.6.     COVID-19 Universal Testing on Admission (if asymptomatic, does not need repeat if done with the past 7 days): Asymptomatic & Negative    COVID-19 Vaccine: Already completed    PAST MEDICAL / SURGICAL HX:  Past Medical History:   Diagnosis Date   ??? Alcohol withdrawal syndrome, with delirium (CMS-HCC) 05/11/2012   ??? Anxiety    ??? Bipolar 1 disorder (CMS-HCC)    ??? Depressive disorder    ??? Erectile dysfunction    ??? Esophageal reflux    ??? History of pyloric stenosis    ??? Hypertension    ??? Insomnia    ??? Psoriasis     Dermatologist - Dr. Deloria Lair in [facility]   ??? Seizure (CMS-HCC) 02/20/2019   ??? Tobacco use     1ppd     Past Surgical History:   Procedure Laterality Date   ??? PYLOROMYOTOMY     ??? WISDOM TOOTH EXTRACTION         FAMILY HX:  (must be updated in the history tab, then erase the red text)  Family History  Problem Relation Age of Onset   ??? Bipolar disorder Mother    ??? Depression Sister    ??? Alcohol abuse Sister    ??? Alcohol abuse Maternal Aunt    ??? Melanoma Neg Hx    ??? Basal cell carcinoma Neg Hx    ??? Squamous cell carcinoma Neg Hx        SOCIAL HX:  (must be updated in the history tab, then erase the red text)  Social History     Socioeconomic History   ??? Marital status: Married     Spouse name: None   ??? Number of children: None   ??? Years of education: None   ??? Highest education level: None   Tobacco Use   ??? Smoking status: Current Every Day Smoker     Packs/day: 1.00     Years: 30.00     Pack years: 30.00     Types: Cigarettes   ??? Smokeless tobacco: Never Used   Vaping Use   ??? Vaping Use: Never used   Substance and Sexual Activity   ??? Alcohol use: Yes     Comment: 3 drinks a day.   ??? Drug use: Yes     Types: Marijuana   Other Topics Concern   ??? Do you use sunscreen? Yes   ??? Tanning bed use? Yes   ??? Are you easily burned? Yes   ??? Excessive sun exposure? No   ??? Blistering sunburns? Yes   Social History Narrative    Updated 01/03/2020        Lives in [place] with wife and two children.  Currently unemployed had Worked at [employer].  Current smoker.History of excessive ETOh abuse has been sober since September 2021.Marland Kitchen           PSYCHIATRIC HX:     -Current provider(s):  [facility] Dr. Roxanne Gates     -Suicide attempts/SIB: NO -Psych Hospitalizations:  2014 for depression at [facility]    -Med compliance hx: Poor,      -Fa hx suicide: YES, sister attempted         SUBSTANCE ABUSE HX:     -Current using substance: sig hx of alcohol use (last drink Sept 2021) and was hospitalized for withdrawal with DT    -Hx w/d sxs: YES    -Sz Hx: YES,    -DT Hx:YES, Sept 2021        SOCIAL HX:    -Current living environment: lives in [place] with wife and 2 childrne     -Current support: wife, father     -Violence (perp): verbal, has been kicking walls     -Access to Firearms: NO        -Guardian: NO        -Trauma: NO             Social Determinants of Health     Financial Resource Strain: Medium Risk   ??? Difficulty of Paying Living Expenses: Somewhat hard   Food Insecurity: No Food Insecurity   ??? Worried About Running Out of Food in the Last Year: Never true   ??? Ran Out of Food in the Last Year: Never true   Transportation Needs: No Transportation Needs   ??? Lack of Transportation (Medical): No   ??? Lack of Transportation (Non-Medical): No       MEDICATIONS / ALLERGIES:  (Not in a hospital admission)      Allergies   Allergen Reactions   ??? Tramadol  Risk of seizures   ??? Hydroxyzine Itching     Sedation.  Ineffective for reducing anxiety.       IMMUNIZATIONS: if Pneumonia vaccines not up to date, please order with admission orders   Immunization History   Administered Date(s) Administered   ??? COVID-19 VACCINE,MRNA(MODERNA)(PF)(IM) 04/26/2019, 06/08/2019, 05/20/2020   ??? INFLUENZA TIV (TRI) 63MO+ W/ PRESERV (IM) 11/17/2007   ??? INFLUENZA TIV (TRI) PF (IM) 02/20/2009   ??? Influenza Vaccine Quad (IIV4 PF) 78mo+ injectable 09/27/2018, 10/22/2019   ??? PNEUMOCOCCAL POLYSACCHARIDE 23 08/16/2014   ??? Pneumococcal Conjugate 13-Valent 05/31/2020   ??? SHINGRIX-ZOSTER VACCINE (HZV), RECOMBINANT,SUB-UNIT,ADJUVANTED IM 06/01/2020   ??? TdaP 02/20/2009       REVIEW OF SYSTEMS:  Pertinent positives and negatives per HPI. A complete review of systems otherwise negative.    PHYSICAL EXAM:    Initial ED Vitals:   ED Triage Vitals [07/07/20 1740]   Enc Vitals Group      BP 123/77      Heart Rate 64      SpO2 Pulse 64      Resp 16      Temp 36.4 ??C      Temp Source Axillary      SpO2 96 %      Weight       Height       Head Circumference       Peak Flow       Pain Score       Pain Loc       Pain Edu?       Excl. in GC?        Recent Vitals:  Vitals:    07/08/20 0300   BP: 106/71   Pulse: 67   Resp: 12   Temp:    SpO2: 93%       GEN: Anxious appearing , flushing of chest and neck  Eyes: Stable anisocoria. No scleral icterus. Conjunctiva non-erythematous. EOMI.  HEENT: NCAT, MMM. Oropharynx clear.  Neck: Supple.  Lymphadenopathy: No cervical or supraclavicular LAD.  CV: Regular rate and rhythm. No murmurs/rubs/gallops. No costochondral tenderness. No cyanosis or clubbing.  Pulm: CTAB. No wheezing, crackles, or rhonchi.  Abd: Flat.  Nontender. No guarding, rebound.  Normoactive bowel sounds.    Neuro: A&O x 3. No focal deficits. Strength 5/5 UE/LE. Distal sensation to light touch intact.  Ext: 3+ pitting edema lower extremities  Skin: No rashes or skin lesions.  Sacrum examined and small amount of redness (no definitive stage 1).       LABS/ STUDIES:  All imaging, laboratory studies, and other pertinent tests including electrocardiography were reviewed prior to admission and are summarized within the assessment and plan.         Petra Kuba, MD  July 08, 2020 4:12 AM

## 2020-07-08 NOTE — Unmapped (Signed)
Patients wife called stating that she took the patient back to the hospital last night seizure last night and he I now back in [facility]. She would like a return phone call.

## 2020-07-08 NOTE — Unmapped (Signed)
Care Management  Initial Transition Planning Assessment              General  Care Manager assessed the patient by : Telephone conversation with patient  Orientation Level: Oriented X4  Functional level prior to admission: Partially Assisted  Level of assistance required: Walking    Contact/Decision Maker  Extended Emergency Contact Information  Primary Emergency Contact: Bisbee,[name]  Address: [address] [place], [place] [address] [place] [employer] Phone: [phone]  Relation: Spouse  Preferred language: ENGLISH  Interpreter needed? No    Legal Next of Kin / Guardian / POA / Advance Directives     HCDM (patient stated preference): Bisbee,[name] - Spouse - [phone]    Advance Directive (Medical Treatment)  Does patient have an advance directive covering medical treatment?: Patient has advance directive covering medical treatment, copy not in chart. (Living Will.Per patient wife HCPOA)  Reason patient does not have an advance directive covering medical treatment:: Patient does not wish to complete one at this time.    Health Care Decision Maker [HCDM] (Medical & Mental Health Treatment)  Healthcare Decision Maker: HCDM documented in the HCDM/Contact Info section.  Information offered on HCDM, Medical & Mental Health advance directives:: Patient given information. (spouse- christie in chart)    Advance Directive (Mental Health Treatment)  Does patient have an advance directive covering mental health treatment?: Patient does not have advance directive covering mental health treatment.  Reason patient does not have an advance directive covering mental health treatment:: Patient does not wish to complete one at this time.    Patient Information  Lives with: Spouse/significant other    Type of Residence: Private residence        Location/Detail: One story with ramp    Support Systems/Concerns: Spouse    Responsibilities/Dependents at home?: No    Home Care services in place prior to admission?: No                  Equipment Currently Used at Home: other (see comments) (Ramp. Rolator)       Currently receiving outpatient dialysis?: N/A       Financial Information       Need for financial assistance?: Other (Comment) (Wife supports, patient applied for disability)       Social Determinants of Health  Social Determinants of Health were addressed in provider documentation.  Please refer to patient history.    Discharge Needs Assessment  Concerns to be Addressed: adjustment to diagnosis/illness, discharge planning, financial/insurance, other (see comments) Teaching laboratory technician)    Clinical Risk Factors: New Diagnosis, Principal Diagnosis: Cancer, Stroke, COPD, Heart Failure, AMI, Pneumonia, Joint Replacment, Multiple Diagnoses (Chronic)    Barriers to taking medications: No    Prior overnight hospital stay or ED visit in last 90 days: Yes    Readmission Within the Last 30 Days: no previous admission in last 30 days    Patient's Choice of Community Agency(s): [facility]         Equipment Needed After Discharge: other (see comments) (TBD)    Discharge Facility/Level of Care Needs:      Readmission  Risk of Unplanned Readmission Score: UNPLANNED READMISSION SCORE: 44.73%  Predictive Model Details          45% (High)  Factor Value    Calculated 07/08/2020 08:03 23% Number of ED visits in last six months 7    [facility] Risk of Unplanned Readmission Model  17% Number of hospitalizations in last year 6     14% Number of active Rx orders 33     6% Active antipsychotic Rx order present     6% ECG/EKG order present in last 6 months     5% Latest calcium low (8.5 mg/dL)     5% Encounter of ten days or longer in last year present     5% Diagnosis of electrolyte disorder present     4% Restraint order present in last 6 months     4% Imaging order present in last 6 months     4% Phosphorous result present     3% Diagnosis of deficiency anemia present     3% Age 62     3% Active anticoagulant Rx order present     1% Future appointment scheduled     0% Current length of stay 0.169 days      Readmitted Within the Last 30 Days? (No if blank)   Patient at risk for readmission?: Yes    Discharge Plan  Screen findings are: Discharge planning needs identified or anticipated (Comment).    Expected Discharge Date:     Expected Transfer from Critical Care:         Patient and/or family were provided with choice of facilities / services that are available and appropriate to meet post hospital care needs?: Other (Comment) [facility])       Initial Assessment complete?: Yes   Per patient has difficulty with getting to appointments . Wife works. Per patient applied for disability.  Per patient ETOH 1 pint / daily. Denies Drug use

## 2020-07-09 LAB — CBC
HEMATOCRIT: 37.1 % — ABNORMAL LOW (ref 39.0–48.0)
HEMOGLOBIN: 12.6 g/dL — ABNORMAL LOW (ref 12.9–16.5)
MEAN CORPUSCULAR HEMOGLOBIN CONC: 33.9 g/dL (ref 32.0–36.0)
MEAN CORPUSCULAR HEMOGLOBIN: 35.7 pg — ABNORMAL HIGH (ref 25.9–32.4)
MEAN CORPUSCULAR VOLUME: 105.2 fL — ABNORMAL HIGH (ref 77.6–95.7)
MEAN PLATELET VOLUME: 7.6 fL (ref 6.8–10.7)
PLATELET COUNT: 121 10*9/L — ABNORMAL LOW (ref 150–450)
RED BLOOD CELL COUNT: 3.53 10*12/L — ABNORMAL LOW (ref 4.26–5.60)
RED CELL DISTRIBUTION WIDTH: 15.6 % — ABNORMAL HIGH (ref 12.2–15.2)
WBC ADJUSTED: 4.5 10*9/L (ref 3.6–11.2)

## 2020-07-09 LAB — COMPREHENSIVE METABOLIC PANEL
ALBUMIN: 2.7 g/dL — ABNORMAL LOW (ref 3.4–5.0)
ALKALINE PHOSPHATASE: 88 U/L (ref 46–116)
ALT (SGPT): 10 U/L (ref 10–49)
ANION GAP: 8 mmol/L (ref 5–14)
AST (SGOT): 32 U/L (ref <=34)
BILIRUBIN TOTAL: 0.6 mg/dL (ref 0.3–1.2)
BLOOD UREA NITROGEN: 6 mg/dL — ABNORMAL LOW (ref 9–23)
BUN / CREAT RATIO: 8
CALCIUM: 8.3 mg/dL — ABNORMAL LOW (ref 8.7–10.4)
CHLORIDE: 98 mmol/L (ref 98–107)
CO2: 28.2 mmol/L (ref 20.0–31.0)
CREATININE: 0.73 mg/dL
EGFR CKD-EPI (2021) MALE: >90 mL/min/{1.73_m2} (ref >=60)
GLUCOSE RANDOM: 111 mg/dL (ref 70–179)
POTASSIUM: 3.8 mmol/L (ref 3.4–4.8)
PROTEIN TOTAL: 6.6 g/dL (ref 5.7–8.2)
SODIUM: 134 mmol/L — ABNORMAL LOW (ref 135–145)

## 2020-07-09 LAB — MAGNESIUM: MAGNESIUM: 1.4 mg/dL — ABNORMAL LOW (ref 1.6–2.6)

## 2020-07-09 LAB — PHOSPHORUS: PHOSPHORUS: 4 mg/dL (ref 2.4–5.1)

## 2020-07-09 LAB — PROTIME-INR
INR: 1.03
PROTIME: 12.1 s (ref 10.3–13.4)

## 2020-07-09 LAB — PROTEIN / CREATININE RATIO, URINE
CREATININE, URINE: 14 mg/dL
PROTEIN URINE: <6 mg/dL

## 2020-07-09 MED ORDER — NICOTINE (POLACRILEX) 4 MG BUCCAL LOZENGE
BUCCAL | 0 refills | 5 days | PRN
Start: 2020-07-09 — End: 2020-08-08

## 2020-07-09 MED ORDER — NICOTINE (POLACRILEX) 4 MG GUM
BUCCAL | 0 refills | 5 days | PRN
Start: 2020-07-09 — End: 2020-08-08

## 2020-07-09 MED ADMIN — dilTIAZem (CARDIZEM CD) 24 hr capsule 360 mg: 360 mg | ORAL | @ 12:00:00

## 2020-07-09 MED ADMIN — furosemide (LASIX) tablet 20 mg: 20 mg | ORAL | @ 12:00:00

## 2020-07-09 MED ADMIN — gabapentin (NEURONTIN) capsule 200 mg: 200 mg | ORAL | @ 01:00:00

## 2020-07-09 MED ADMIN — magnesium oxide (MAG-OX) tablet 800 mg: 800 mg | ORAL | @ 12:00:00 | Stop: 2020-07-09

## 2020-07-09 MED ADMIN — enoxaparin (LOVENOX) syringe 40 mg: 40 mg | SUBCUTANEOUS | @ 12:00:00

## 2020-07-09 MED ADMIN — folic acid (FOLVITE) tablet 1 mg: 1 mg | ORAL | @ 12:00:00

## 2020-07-09 MED ADMIN — magnesium sulfate 2gm/50mL IVPB: 2 g | INTRAVENOUS | @ 15:00:00 | Stop: 2020-07-09

## 2020-07-09 MED ADMIN — magnesium oxide (MAG-OX) tablet 800 mg: 800 mg | ORAL | @ 01:00:00

## 2020-07-09 MED ADMIN — sodium chloride tablet 1 g: 1 g | ORAL | @ 16:00:00

## 2020-07-09 MED ADMIN — gabapentin (NEURONTIN) capsule 200 mg: 200 mg | ORAL | @ 12:00:00

## 2020-07-09 MED ADMIN — sodium chloride tablet 1 g: 1 g | ORAL | @ 21:00:00

## 2020-07-09 MED ADMIN — divalproex ER (DEPAKOTE ER) extended release 24 hr tablet 750 mg: 750 mg | ORAL | @ 01:00:00

## 2020-07-09 MED ADMIN — nicotine (NICODERM CQ) 21 mg/24 hr patch 1 patch: 1 | TRANSDERMAL | @ 12:00:00

## 2020-07-09 MED ADMIN — sodium chloride tablet 1 g: 1 g | ORAL | @ 12:00:00

## 2020-07-09 MED ADMIN — thiamine mononitrate (vit B1) tablet 100 mg: 100 mg | ORAL | @ 12:00:00

## 2020-07-09 MED ADMIN — LORazepam (ATIVAN) tablet 2 mg: 2 mg | ORAL

## 2020-07-09 MED ADMIN — OLANZapine (ZyPREXA) tablet 20 mg: 20 mg | ORAL | @ 01:00:00

## 2020-07-09 NOTE — Unmapped (Signed)
Replaced condom cath twice.    All labs collected.    Pt was NPO until ABD doppler procedure is completed.       Problem: Adult Inpatient Plan of Care  Goal: Plan of Care Review  Outcome: Progressing  Goal: Absence of Hospital-Acquired Illness or Injury  Intervention: Identify and Manage Fall Risk  Recent Flowsheet Documentation  Taken 07/09/2020 0743 by Dennis Bast, RN  Safety Interventions: aspiration precautions  Intervention: Prevent and Manage VTE (Venous Thromboembolism) Risk  Recent Flowsheet Documentation  Taken 07/09/2020 0743 by Dennis Bast, RN  Activity Management: bedrest  Intervention: Prevent Infection  Recent Flowsheet Documentation  Taken 07/09/2020 0743 by Dennis Bast, RN  Infection Prevention:   cohorting utilized   environmental surveillance performed   equipment surfaces disinfected   hand hygiene promoted   personal protective equipment utilized   rest/sleep promoted   single patient room provided   visitors restricted/screened  Goal: Optimal Comfort and Wellbeing  Outcome: Progressing     Problem: Infection  Goal: Absence of Infection Signs and Symptoms  Outcome: Progressing  Intervention: Prevent or Manage Infection  Recent Flowsheet Documentation  Taken 07/09/2020 0743 by Dennis Bast, RN  Infection Management: aseptic technique maintained  Isolation Precautions: enteric precautions maintained     Problem: Skin Injury Risk Increased  Goal: Skin Health and Integrity  Outcome: Progressing     Problem: Fall Injury Risk  Goal: Absence of Fall and Fall-Related Injury  Outcome: Progressing  Intervention: Promote Injury-Free Environment  Recent Flowsheet Documentation  Taken 07/09/2020 0743 by Dennis Bast, RN  Safety Interventions: aspiration precautions     Problem: Self-Care Deficit  Goal: Improved Ability to Complete Activities of Daily Living  Outcome: Progressing     Problem: Behavioral Health Comorbidity  Goal: Maintenance of Behavioral Health Symptom Control  Outcome: Progressing     Problem: Heart Failure Comorbidity  Goal: Maintenance of Heart Failure Symptom Control  Outcome: Progressing     Problem: Hypertension Comorbidity  Goal: Blood Pressure in Desired Range  Outcome: Progressing

## 2020-07-09 NOTE — Unmapped (Signed)
Pt remains stepdown. VSS on room air, CIWA scores < 3 no medication given. C.dif came back negative, but full GI pathogen panel was ordered so enteric precautions continued. Good uop and no BM this shift. No concerns at this time.    Problem: Adult Inpatient Plan of Care  Goal: Absence of Hospital-Acquired Illness or Injury  Intervention: Identify and Manage Fall Risk  Recent Flowsheet Documentation  Taken 07/08/2020 2000 by Gardiner Sleeper, RN  Safety Interventions:  ??? aspiration precautions  ??? bed alarm  ??? fall reduction program maintained  ??? lighting adjusted for tasks/safety  ??? low bed  Intervention: Prevent Skin Injury  Recent Flowsheet Documentation  Taken 07/08/2020 2000 by Gardiner Sleeper, RN  Skin Protection:  ??? adhesive use limited  ??? incontinence pads utilized  ??? tubing/devices free from skin contact  Intervention: Prevent Infection  Recent Flowsheet Documentation  Taken 07/08/2020 2000 by Gardiner Sleeper, RN  Infection Prevention: cohorting utilized     Problem: Infection  Goal: Absence of Infection Signs and Symptoms  Outcome: Progressing  Intervention: Prevent or Manage Infection  Recent Flowsheet Documentation  Taken 07/08/2020 2000 by Gardiner Sleeper, RN  Infection Management: aseptic technique maintained  Isolation Precautions: enteric precautions maintained     Problem: Fall Injury Risk  Goal: Absence of Fall and Fall-Related Injury  Outcome: Ongoing - Unchanged  Intervention: Promote Injury-Free Environment  Recent Flowsheet Documentation  Taken 07/08/2020 2000 by Gardiner Sleeper, RN  Safety Interventions:  ??? aspiration precautions  ??? bed alarm  ??? fall reduction program maintained  ??? lighting adjusted for tasks/safety  ??? low bed     Problem: Self-Care Deficit  Goal: Improved Ability to Complete Activities of Daily Living  Outcome: Ongoing - Unchanged     Problem: Hypertension Comorbidity  Goal: Blood Pressure in Desired Range  Outcome: Progressing

## 2020-07-09 NOTE — Unmapped (Signed)
Problem: Adult Inpatient Plan of Care  Goal: Plan of Care Review  Outcome: Ongoing - Unchanged  Goal: Patient-Specific Goal (Individualized)  Outcome: Ongoing - Unchanged  Goal: Absence of Hospital-Acquired Illness or Injury  Outcome: Ongoing - Unchanged  Intervention: Identify and Manage Fall Risk  Recent Flowsheet Documentation  Taken 07/08/2020 1030 by Gorden Harms, RN  Safety Interventions:   aspiration precautions   bed alarm   fall reduction program maintained   infection management   isolation precautions   lighting adjusted for tasks/safety   low bed   room near unit station   toileting scheduled  Intervention: Prevent Skin Injury  Recent Flowsheet Documentation  Taken 07/08/2020 1206 by Gorden Harms, RN  Skin Protection:   adhesive use limited   cleansing with dimethicone incontinence wipes   incontinence pads utilized   transparent dressing maintained   tubing/devices free from skin contact  Taken 07/08/2020 1030 by Gorden Harms, RN  Skin Protection:   adhesive use limited   cleansing with dimethicone incontinence wipes   incontinence pads utilized   transparent dressing maintained   tubing/devices free from skin contact  Intervention: Prevent and Manage VTE (Venous Thromboembolism) Risk  Recent Flowsheet Documentation  Taken 07/08/2020 1206 by Gorden Harms, RN  VTE Prevention/Management:   ambulation promoted   bleeding precautions maintained   fluids promoted  Taken 07/08/2020 1030 by Gorden Harms, RN  Activity Management:   activity adjusted per tolerance   activity encouraged  Range of Motion: Bilateral Upper and Lower Extremities  Intervention: Prevent Infection  Recent Flowsheet Documentation  Taken 07/08/2020 1030 by Gorden Harms, RN  Infection Prevention:   cohorting utilized   environmental surveillance performed   equipment surfaces disinfected   hand hygiene promoted   personal protective equipment utilized   rest/sleep promoted   single patient room provided  Goal: Optimal Comfort and Wellbeing  Outcome: Ongoing - Unchanged  Goal: Readiness for Transition of Care  Outcome: Ongoing - Unchanged  Goal: Rounds/Family Conference  Outcome: Ongoing - Unchanged     Problem: Infection  Goal: Absence of Infection Signs and Symptoms  Outcome: Ongoing - Unchanged  Intervention: Prevent or Manage Infection  Recent Flowsheet Documentation  Taken 07/08/2020 1030 by Gorden Harms, RN  Infection Management: aseptic technique maintained  Isolation Precautions: enteric precautions maintained     Problem: Skin Injury Risk Increased  Goal: Skin Health and Integrity  Outcome: Ongoing - Unchanged  Intervention: Optimize Skin Protection  Recent Flowsheet Documentation  Taken 07/08/2020 1800 by Gorden Harms, RN  Pressure Reduction Techniques:   frequent weight shift encouraged   heels elevated off bed   weight shift assistance provided  Pressure Reduction Devices:   positioning supports utilized   pressure-redistributing mattress utilized  Taken 07/08/2020 1600 by Gorden Harms, RN  Pressure Reduction Techniques:   frequent weight shift encouraged   heels elevated off bed   weight shift assistance provided  Pressure Reduction Devices:   positioning supports utilized   pressure-redistributing mattress utilized  Taken 07/08/2020 1400 by Gorden Harms, RN  Pressure Reduction Techniques:   frequent weight shift encouraged   heels elevated off bed   weight shift assistance provided  Pressure Reduction Devices:   positioning supports utilized   pressure-redistributing mattress utilized  Taken 07/08/2020 1206 by Gorden Harms, RN  Pressure Reduction Techniques:   frequent weight shift encouraged   heels elevated off bed   weight shift assistance provided  Pressure Reduction Devices:   positioning supports utilized   pressure-redistributing mattress utilized  Skin Protection:   adhesive use limited   cleansing with dimethicone incontinence wipes   incontinence pads utilized   transparent dressing maintained   tubing/devices free from skin contact  Taken 07/08/2020 1200 by Gorden Harms, RN  Pressure Reduction Techniques:   frequent weight shift encouraged   heels elevated off bed  Pressure Reduction Devices:   positioning supports utilized   pressure-redistributing mattress utilized  Taken 07/08/2020 1030 by Gorden Harms, RN  Pressure Reduction Techniques:   frequent weight shift encouraged   heels elevated off bed   weight shift assistance provided  Head of Bed (HOB) Positioning: HOB at 20-30 degrees  Pressure Reduction Devices:   positioning supports utilized   pressure-redistributing mattress utilized  Skin Protection:   adhesive use limited   cleansing with dimethicone incontinence wipes   incontinence pads utilized   transparent dressing maintained   tubing/devices free from skin contact     Problem: Fall Injury Risk  Goal: Absence of Fall and Fall-Related Injury  Outcome: Ongoing - Unchanged  Intervention: Promote Scientist, clinical (histocompatibility and immunogenetics) Documentation  Taken 07/08/2020 1030 by Gorden Harms, RN  Safety Interventions:   aspiration precautions   bed alarm   fall reduction program maintained   infection management   isolation precautions   lighting adjusted for tasks/safety   low bed   room near unit station   toileting scheduled     Problem: Self-Care Deficit  Goal: Improved Ability to Complete Activities of Daily Living  Outcome: Ongoing - Unchanged     Problem: Behavioral Health Comorbidity  Goal: Maintenance of Behavioral Health Symptom Control  Outcome: Ongoing - Unchanged     Problem: Heart Failure Comorbidity  Goal: Maintenance of Heart Failure Symptom Control  Outcome: Ongoing - Unchanged     Problem: Hypertension Comorbidity  Goal: Blood Pressure in Desired Range  Outcome: Ongoing - Unchanged

## 2020-07-09 NOTE — Unmapped (Signed)
OCCUPATIONAL THERAPY  Evaluation (07/09/20 1541)    Patient Name:  Nicholas Rush       Medical Record Number: [id number]   Date of Birth: Jul 18, 1965  Sex: Male          OT Treatment Diagnosis:  eval for eval and treat, admission eval    Assessment    Problem List: Fall Risk, Impaired ADLs, Decreased strength, Decreased mobility, Impaired balance, Decreased endurance    Assessment: Nicholas Rush is a 55 y.o. male with a past medical history significant for HTN, bipolar I disorder, alcohol use disorder c/b withdrawal seizures, recurrent C diff infection who presents s/p witnessed grand Mal seizure for monitored alcohol withdrawal and is found to have new onset LEE. Pt reported supervision/min A for ADLs and grossly mod I using rollator and requires min assist for showering and LE dressing, and grossly mod I for functional mobility using rollator. Pt is currently functioning below baseline, requiring CGA/min A for functional transfers and min/mod A for ADLs due to demonstrated deficits including decreased LE strength, decreased balance, decreased safety awareness, and decreased core strength decreasing independence. Skilled OT recommended to address these deficits and increase independence. Recommend 5x low at DC due to significant fall risk and multiple admissions presenting with weakness. After review of the patient's occupational profile and history, assessment of occupational performance, clinical decision making, and development of POC, the patient presents as a moderate complexity case. Based on the daily activity AM-PAC raw score of 16/24, the patient is considered to be 53.3% impaired with self care.    Today's Interventions: ADL retraining, Balance activities, Conservation, Education - Patient, Safety education, Functional mobility, Endurance activities, Transfer training  Today's Interventions: role of OT, OT POC, bed mobility, endurance training, strength training, safety education, balance training, seated grooming tasks, LE dressing    Activity Tolerance During Today's Session  Tolerated treatment well    Plan  Planned Frequency of Treatment:  1-2x per day for: 3-4x week       Planned Interventions:  ADL retraining, Balance activities, Conservation, Education - Patient, Education - Family / caregiver, Endurance activities, Functional mobility, Safety education, Therapeutic exercise, UE Strength / coordination exercise, Transfer training    Post-Discharge Occupational Therapy Recommendations:   5x weekly, Low intensity   OT DME Recommendations: Defer to post acute -        GOALS:   Patient and Family Goals: get stronger    IP Long Term Goal #1: Pt will maximize participation in self care in 6 weeks       Short Term:  Pt will complete toilet transfer with SBA using LRD and no balance concerns noted   Time Frame : 1 week  Pt will complete LE dressing with SBA and no balance concerns noted using LRD   Time Frame : 1 week  Pt will complete grooming task standing at sink x5 mins with SBA and no balance concerns noted using LRD   Time Frame : 1 week  Pt will complete full body bathing with SBA with no balance or safety concerns using LRD   Time Frame : 1 week           Prognosis:  Fair  Positive Indicators:  motivation  Barriers to Discharge: Endurance deficits, Impaired Balance, Decreased safety awareness, Decreased caregiver support, Severity of deficits    Subjective  Current Status Pt found and left sitting in bed with all needs met and call bell within reach  Prior Functional  Status Pt reported min/mod A for ADLs and grossly mod I using rollator. Pt requires assist for showering and LE dressing. Pt typically completed eating and grooming tasks in seated position independently    Medical Tests / Procedures: reviewed in EPIC  Services patient receives: OT, PT    Patient / Caregiver reports: I haven't fallen at home Pt agreeable to OT sesion    Past Medical History:   Diagnosis Date   ??? Alcohol withdrawal syndrome, with delirium (CMS-HCC) 05/11/2012   ??? Anxiety    ??? Bipolar 1 disorder (CMS-HCC)    ??? Depressive disorder    ??? Erectile dysfunction    ??? Esophageal reflux    ??? History of pyloric stenosis    ??? Hypertension    ??? Insomnia    ??? Psoriasis     Dermatologist - Dr. Deloria Lair in [facility]   ??? Seizure (CMS-HCC) 02/20/2019   ??? Tobacco use     1ppd    Social History     Tobacco Use   ??? Smoking status: Current Every Day Smoker     Packs/day: 1.50     Years: 30.00     Pack years: 45.00     Types: Cigarettes   ??? Smokeless tobacco: Never Used   Substance Use Topics   ??? Alcohol use: Yes     Comment: 3 drinks a day.      Past Surgical History:   Procedure Laterality Date   ??? PYLOROMYOTOMY     ??? WISDOM TOOTH EXTRACTION      Family History   Problem Relation Age of Onset   ??? Bipolar disorder Mother    ??? Depression Sister    ??? Alcohol abuse Sister    ??? Alcohol abuse Maternal Aunt    ??? Melanoma Neg Hx    ??? Basal cell carcinoma Neg Hx    ??? Squamous cell carcinoma Neg Hx         Tramadol and Hydroxyzine     Objective Findings  Precautions / Restrictions  Falls precautions, Seizure precautions    Weight Bearing  Non-applicable    Required Braces or Orthoses  Non-applicable    Communication Preference  Verbal    Pain  Pt reported no pain    Equipment / Environment  Caregiver wearing mask for full session, Patient not wearing mask for full session, Vascular access (PIV, TLC, Port-a-cath, PICC), Telemetry, Purewick/Condom catheter    Living Situation  Living Environment: Mobile home  Lives With: Spouse  Home Living: One level home, Ramped entrance, Tub/shower unit, Hand-held shower hose, Standard height toilet, Raised toilet seat with rails, Tub bench  Equipment available at home: Rolling Walker, Rollator     Cognition   Orientation Level:  Oriented x 4   Arousal/Alertness:  Appropriate responses to stimuli   Attention Span:  Appears intact   Memory:  Appears intact   Following Commands:  Follows one step commands without difficulty   Safety Judgment:  Decreased awareness of need for assistance, Decreased awareness of need for safety   Awareness of Errors:  Unable to assess   Problem Solving:  Unable to assess   Comments:      Vision / Hearing   Vision: Wears glasses for reading only     Hearing: No deficit identified         Hand Function:  Right Hand Function: Right hand grip strength, ROM and coordination WNL  Left Hand Function: Left hand grip strength, ROM and coordination WNL  Hand Dominance: Right    Skin Inspection:  Skin Inspection: Swelling  Skin Inspection comment: B LE edema    ROM / Strength:  UE ROM/Strength: Left Impaired/Limited, Right Impaired/Limited  RUE Impairment: Reduced strength  LUE Impairment: Reduced strength  LE ROM/Strength: Left Impaired/Limited, Right Impaired/Limited  RLE Impairment: Reduced strength  LLE Impairment: Reduced strength    Coordination:  Coordination: Incoordination    Sensation:  RUE Sensation: RUE intact  LUE Sensation: LUE intact  RLE Sensation: RLE intact  LLE Sensation: LLE intact    Balance:  static sitting- SBA. static standing - CGA. dynamic standing - min A using RW, flexed posture noted and verbal cues for balance    Functional Mobility  Transfer Assistance Needed: Yes  Transfers - Needs Assistance: Min assist (sit to stand using RW, flexed posture noted)  Bed Mobility Assistance Needed: No  Ambulation: pt completed functional mobility x3 side steps using RW and min A and verbal cues for balance and safety concerns, and flexed posture noted      ADLs  ADLs: Needs assistance with ADLs  ADLs - Needs Assistance: Grooming, Bathing, UB dressing, LB dressing  Grooming - Needs Assistance: Performed seated, Set Up Assist, Verbal assist/cues required (oral care sitting EOB, verbal cues for posture and safety)  Bathing - Needs Assistance: Mod assist (anticipate mod A for peri care and LE bathing)  UB Dressing - Needs Assistance: Set Up Assist  LB Dressing - Needs Assistance: Mod assist (donning B socks)      Vitals / Orthostatics  At Rest: O2 sat >90% on RA. HR 71 BPM  With Activity: O2 sat >90% on RA. HR 80 BPM following functional mobility  Orthostatics: NT      Medical Staff Made Aware: RN aware      Occupational Therapy Session Duration  OT Individual [mins]: 23         I attest that I have reviewed the above information.  Signed: Girard Cooter, OT  [facility] 07/09/2020

## 2020-07-09 NOTE — Unmapped (Signed)
Family Medicine Inpatient Service    Progress Note    Team: [facility] (pgr [phone])    Hospital Day: 1    ASSESSMENT / PLAN:   Nicholas Rush is a 55 y.o. male with a past medical history significant for HTN, bipolar I??disorder, alcohol use??disorder c/b withdrawal seizures,??recurrent??C??diff??infection who presents s/p witnessed grand Mal seizure for monitored alcohol withdrawal and is found to have new onset LEE.     ## Alcohol Use Disorder - hx complicated W/d  Last EtOH now between 48-72 hours ago. No significant CIWA scoring    - thiamine and folate daily  - zofran PRN  - melatonin at bedtime  - home gabapentin dose 200mg  BID  - patient desires detox but discharge home following stabilization  ??  ## Myotonic Clonic Seizure   Most likely provoked seizure 2/2 alcohol or infection. Spot EEG during recent hospitalization not suggestive of underlying pathology. Neurology suggests most consistent with provoked seizure and recommends titration of depakote to therapeutic level.   ??  ## Lower Extremity Edema  New onset edema without other HF symptoms. Differential includes new onset HF, renal dysfunction (primarily proteinuria), and portal hypertension. Given recent ECHO and lack of elevated BNP or orthopnea/PND will perform bedside U/S to rule out gross reduction in EF. UPC unremarkable. RUQ [place] mostly unremarkable, but liver dysfunction could be contributory factor to hypoalbuminemia.  - bedside ECHO  - start lasix 20mg  daily   ??  ##??Diarrhea:??  C diff negative. GIPP pending, but patient without stools since yesterday.   - continue to hold on bowel regimen      ## Bipolar I Disorder: well-controlled on Depakote and Zyprexa. ??  - continue zyprexa 20mg  at bedtime  - Will get level on VPA since adherence is questionable. Then consider titration.   ??  ##Paroxysmal atrial fibrillation Mag 1.5.    - Continue diltiazem CD 360 mg daily   - magnesium 2 g IV once, CTM  ??  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   - 1 g salt TID   - daily labs  ??  ## Tobacco use disorder: Patient currently smoking 1PPD. Will place inpatient consult to tobacco cessation for toxic effects of tobacco  - Tobacco cessation consult  - Nicotine replacement therapy with patch, gum, lozenge PRN  ??    # Checklist:  - IVF None  - Tubes/Lines/Drains: PIV, ext catheter  - Diet Regular  - Bowel Regimen: No indication for a bowel regimen at this time and loose stools recently  - DVT: SQ Lovenox  - Code Status:   Orders Placed This Encounter   Procedures   ??? Full Code     Standing Status:   Standing     Number of Occurrences:   1     - Dispo: ICU    [ ]  Anticipated Discharge Location: Home  [ ]  PT/OT/DME: PT/OT ordered  [ ]  CM/SW needs: Likely pending PT/OT recs, Substance abuse resources  [ ]  Meds/Rx:  Not yet prescribed. No special med needs  [ ]  Teaching: None anticipated  [ ]  Follow up appt: Appointment needed  [ ]  Excuse letter: None anticipated  [ ]  Transport: Private Needed    SUBJECTIVE:  Interval events: Patient reports he is feeling about at his baseline.      REVIEW OF SYSTEMS:  Pertinent positives and negatives per HPI. A complete review of systems otherwise negative.    PHYSICAL EXAM:  Intake/Output Summary (Last 24 hours) at 07/09/2020 1048  Last data filed at 07/09/2020 1000  Gross per 24 hour   Intake 960 ml   Output 3055 ml   Net -2095 ml       Recent Vitals:  Vitals:    07/09/20 1000   BP:    Pulse: 81   Resp: 22   Temp:    SpO2: 92%       GEN: Well-appearing, NAD     HEENT: NCAT, No scleral icterus. Conjunctiva non-erythematous. MMM.  CV: Regular rate and rhythm. No murmurs/rubs/gallops.  Pulm: Normal work of breathing on RA. CTAB. No wheezing, crackles, or rhonchi.  Abd: Flat.  Nontender. No guarding, rebound.  Normoactive bowel sounds.    Neuro: A&O x 3. No focal deficits.  Ext: 3+ pitting edema on feet. 2+ edema to mid-shin bilaterally.  Palpable distal pulses.  Skin: No rashes or skin lesions.       LABS/ STUDIES:    All imaging, laboratory studies, and other pertinent tests including electrocardiography within the last 24 hours were reviewed and are summarized within the assessment and plan.     NUTRITION:                           Jolyn Nap, MD PGY1  July 09, 2020 10:48 AM

## 2020-07-09 NOTE — Unmapped (Signed)
Spoke with [facility].  Reviewed labs and need for detox upon hospital discharge.  She plans to discuss a plan for detox with him.  Previously has been to [facility] and a program in [place].    Advised to cancel appt with me this week if he is still in the hospital.

## 2020-07-09 NOTE — Unmapped (Signed)
Inpatient Tobacco Cessation Counseling Note    This medical encounter was conducted virtually using [email or web address]  TeleHealth protocols.    I have identified myself to the patient and conveyed my credentials to [name]  I have explained the capabilities and limitations of telemedicine and the patient/proxy and myself both agree that it is appropriate for their current circumstances/symptoms.     Contact Information  Person Contacted: Lavena Bullion Rush         Contact Phone number: [phone]      Phone Outcome: spoke with pt  Is there someone else in the room? No.     Patient's location at the time of the telephone visit: Hospitalized at [facility]   Provider's location at the time of the telephone visit: At home, in [place]      Purpose of contact:     Tobacco Treatment Team (TTP) received inpatient consult from [facility] for [name], 55 y.o. male. Pt participated in a telephone visit for tobacco cessation counseling.  Patient was admitted to hospital for witnessed grand Mal seizure for monitored alcohol withdrawal. Patient consented to telephone visit given due to social isolation measures in place due to the COVID-19 pandemic.     Tobacco Use History and Assessment  Time Since Last Tobacco Use: 1 to 7 days ago  Tobacco Withdrawal (Past 24 Hours): None noted  Type of Tobacco Products Used: Cigarettes  Quantity Used: 30  Quantity Per: day  Time to First Use After Waking: Immediately  Other Household Members Use Tobacco: Yes  Smoking Allowed in Vehicles: Yes  Age Began Use (Years Old): 17  Menthol: No  Longest Time Without Tobacco: Months  # of Hours, Days, Weeks, Months or Years: 6  Most Recent Attempt: >10 years  Previously seen by NDP?: Hospital Inpatient    Behavioral Assessment  Why Uses: 1. habit 2. stress-relief 3. smokes while drinking alcohol 4. wife smokes  Reasons to Become Tobacco Free: health  Barriers/Challenges: 1. long-standing habit 2. stress 3. smoking and drinking are paired 4. spouse smokes  Strategies: pt can 1. use NRT to manage cravings 2. use hand-to-mouth replacements 3. use spacing strategies    NOTE: Pt denies having cravings to smoke and states he smokes 1.5ppd. Pt agrees to using nicotine lozenge in addition to patch during hospitalization and post-discharge. SW provided education on recommended duration for use post-discharge. Pt states he had a 52-month quit period over 10 years ago but started smoking again after his grandfather died. SW and pt discussed the paired nature of alcohol and cigarettes and pt states he is interested in quitting drinking. Pt states he is interested in reducing his tobacco use. SW provided education on health benefits of quitting in the context of pt's new edema and history of HTN. SW and pt discussed triggers, hand-to-mouth replacements, and spacing strategies. Pt states eating popsicles helped when he quit for 6 months. SW and pt discussed increased likelihood of quitting and staying quit using NRT in addition to hand-to-mouth replacements like popsicles. SW provided pt with contact information, physical improvements related to tobacco cessation, and available resources (including outpatient Tobacco Treatment Program at [facility] and [place] [facility]).    Treatment Plan  Please see below for medication recommendations in bold.   Cessation Meds Currently Using: Patch 21mg   Medications Recommended During Hospitalization: Patch 21mg , Lozenge 4mg   Outpatient/Discharge Medications Recommended: Patch 21mg , Lozenge 4mg   Plan to Obtain Outpatient Meds: TTS messaged providers for Rx  Referrals: Referral to outpatient clinic at [facility]  Patient's Plan Post Discharge/Visit: Reduce tobacco use    As part of this Telephone Visit, no in-person exam was conducted.     I personally spent 16 minutes counseling the patient via telephone about tobacco cessation.  I spent an additional 12 minutes on pre- and post-visit activities.      The patient was physically located in [place] or a state in which I am permitted to provide care. The patient and/or parent/guardian understood that s/he may incur co-pays and cost sharing, and agreed to the telemedicine visit. The visit was reasonable and appropriate under the circumstances given the patient's presentation at the time.     The patient and/or parent/guardian has been advised of the potential risks and limitations of this mode of treatment (including, but not limited to, the absence of in-person examination) and has agreed to be treated using telemedicine. The patient's/patient's family's questions regarding telemedicine have been answered.      If the visit was completed in an ambulatory setting, the patient and/or parent/guardian has also been advised to contact their provider???s office for worsening conditions, and seek emergency medical treatment and/or call 911 if the patient deems either necessary.     Visit Format/Coding: Telephone     Coding: 16109 (11-20 minutes)  Service rendered over the phone most consistent with: Tobacco cessation counseling, greater than 10 minutes (60454)     Venancio Poisson, LCSW  Clinical Social Worker / Tobacco Treatment Specialist  [facility]  Phone: [phone]

## 2020-07-10 LAB — CBC
HEMATOCRIT: 38.9 % — ABNORMAL LOW (ref 39.0–48.0)
HEMOGLOBIN: 13.9 g/dL (ref 12.9–16.5)
MEAN CORPUSCULAR HEMOGLOBIN CONC: 35.8 g/dL (ref 32.0–36.0)
MEAN CORPUSCULAR HEMOGLOBIN: 37.5 pg — ABNORMAL HIGH (ref 25.9–32.4)
MEAN CORPUSCULAR VOLUME: 104.6 fL — ABNORMAL HIGH (ref 77.6–95.7)
MEAN PLATELET VOLUME: 8 fL (ref 6.8–10.7)
PLATELET COUNT: 123 10*9/L — ABNORMAL LOW (ref 150–450)
RED BLOOD CELL COUNT: 3.72 10*12/L — ABNORMAL LOW (ref 4.26–5.60)
RED CELL DISTRIBUTION WIDTH: 15.6 % — ABNORMAL HIGH (ref 12.2–15.2)
WBC ADJUSTED: 8.1 10*9/L (ref 3.6–11.2)

## 2020-07-10 LAB — COMPREHENSIVE METABOLIC PANEL
ALBUMIN: 2.9 g/dL — ABNORMAL LOW (ref 3.4–5.0)
ALKALINE PHOSPHATASE: 87 U/L (ref 46–116)
ALT (SGPT): 12 U/L (ref 10–49)
ANION GAP: 6 mmol/L (ref 5–14)
AST (SGOT): 29 U/L (ref <=34)
BILIRUBIN TOTAL: 0.5 mg/dL (ref 0.3–1.2)
BLOOD UREA NITROGEN: 7 mg/dL — ABNORMAL LOW (ref 9–23)
BUN / CREAT RATIO: 9
CALCIUM: 8.3 mg/dL — ABNORMAL LOW (ref 8.7–10.4)
CHLORIDE: 98 mmol/L (ref 98–107)
CO2: 27.6 mmol/L (ref 20.0–31.0)
CREATININE: 0.79 mg/dL
EGFR CKD-EPI (2021) MALE: >90 mL/min/{1.73_m2} (ref >=60)
GLUCOSE RANDOM: 70 mg/dL (ref 70–179)
POTASSIUM: 4.8 mmol/L (ref 3.4–4.8)
PROTEIN TOTAL: 7.3 g/dL (ref 5.7–8.2)
SODIUM: 132 mmol/L — ABNORMAL LOW (ref 135–145)

## 2020-07-10 LAB — PHOSPHORUS: PHOSPHORUS: 4.2 mg/dL (ref 2.4–5.1)

## 2020-07-10 LAB — MAGNESIUM: MAGNESIUM: 1.5 mg/dL — ABNORMAL LOW (ref 1.6–2.6)

## 2020-07-10 MED ADMIN — enoxaparin (LOVENOX) syringe 40 mg: 40 mg | SUBCUTANEOUS | @ 12:00:00

## 2020-07-10 MED ADMIN — LORazepam (ATIVAN) tablet 2 mg: 2 mg | ORAL | @ 03:00:00

## 2020-07-10 MED ADMIN — gabapentin (NEURONTIN) capsule 200 mg: 200 mg | ORAL | @ 12:00:00

## 2020-07-10 MED ADMIN — furosemide (LASIX) tablet 20 mg: 20 mg | ORAL | @ 12:00:00

## 2020-07-10 MED ADMIN — sodium chloride tablet 1 g: 1 g | ORAL | @ 15:00:00

## 2020-07-10 MED ADMIN — magnesium sulfate 2gm/50mL IVPB: 2 g | INTRAVENOUS | @ 14:00:00 | Stop: 2020-07-10

## 2020-07-10 MED ADMIN — nicotine (NICODERM CQ) 21 mg/24 hr patch 1 patch: 1 | TRANSDERMAL | @ 12:00:00

## 2020-07-10 MED ADMIN — dilTIAZem (CARDIZEM CD) 24 hr capsule 360 mg: 360 mg | ORAL | @ 12:00:00

## 2020-07-10 MED ADMIN — LORazepam (ATIVAN) tablet 2 mg: 2 mg | ORAL | @ 12:00:00 | Stop: 2020-07-10

## 2020-07-10 MED ADMIN — LORazepam (ATIVAN) injection 4 mg: 4 mg | INTRAVENOUS | @ 22:00:00

## 2020-07-10 MED ADMIN — OLANZapine (ZyPREXA) tablet 20 mg: 20 mg | ORAL | @ 01:00:00

## 2020-07-10 MED ADMIN — divalproex ER (DEPAKOTE ER) extended release 24 hr tablet 750 mg: 750 mg | ORAL | @ 01:00:00

## 2020-07-10 MED ADMIN — sodium chloride tablet 1 g: 1 g | ORAL | @ 22:00:00

## 2020-07-10 MED ADMIN — gabapentin (NEURONTIN) capsule 200 mg: 200 mg | ORAL | @ 01:00:00

## 2020-07-10 MED ADMIN — folic acid (FOLVITE) tablet 1 mg: 1 mg | ORAL | @ 12:00:00

## 2020-07-10 MED ADMIN — melatonin tablet 3 mg: 3 mg | ORAL | @ 01:00:00

## 2020-07-10 MED ADMIN — sodium chloride tablet 1 g: 1 g | ORAL | @ 12:00:00

## 2020-07-10 MED ADMIN — LORazepam (ATIVAN) injection 4 mg: 4 mg | INTRAVENOUS | @ 12:00:00 | Stop: 2020-07-10

## 2020-07-10 MED ADMIN — LORazepam (ATIVAN) injection 8 mg: 8 mg | INTRAVENOUS | @ 16:00:00 | Stop: 2020-07-10

## 2020-07-10 MED ADMIN — thiamine mononitrate (vit B1) tablet 100 mg: 100 mg | ORAL | @ 12:00:00 | Stop: 2020-07-10

## 2020-07-10 NOTE — Unmapped (Signed)
ICU CONSULT  NOTE    LOS: 2 days     Assessment/Plan:   Active Problems:    Bipolar disorder (CMS-HCC)    Alcohol withdrawal (CMS-HCC)    Alcohol use disorder, moderate, dependence (CMS-HCC)    Hyponatremia    Tobacco use disorder      Malnutrition Assessment from RD:       PLAN BY SYSTEMS   Neurological:   Assessment: Seizures. Alcohol withdrawal. Bipolar  Plan: CIWA and benzos. Q1h neuro checks. Continue to monitor. Continue home Depakote - levels low here on arrival. Continue zyprexa here.    Pulmonary:  Assessment: No issues.  Plan: Continue to monitor.    Cardiovascular:  Assessment: Tachycardia.Paroxysmal a fib  Plan: Continue to monitor. Continue home diltiazem if able to take oral meds.     Renal:  Assessment: Hyponatremia.  Plan: Continue to monitor.    Infectious disease:  Assessment: fevers  Plan: Likely from withdraw, monitor for infectious symptoms    Gastrointestinal:  Assessment: h/o recurrent C diff  Plan: Continue to monitor. negative here    Heme/Coag:  Assessment: Thrombocytopenia.  Plan: DVT prophylaxis with enoxaparin.    Endocrine:  Assessment: No issues.  Plan: Continue to monitor.    Procedures:  None.    Advance Care Planning:  Full Code    CHECKLIST   Can CVC be removed? N/A, no CVC present (including vascular catheter for HD or PLEX)   Can A-line be removed? N/A, no A-line present  Can Foley be removed? N/A, no Foley present  Mobility plan: Step 2 - Head of bed elevation (>60 degrees)    Feeding: NPO while altered to this degree  Analgesia: No pain issues  Sedation SAT/SBT: N/A  Thrombembolic ppx: Enoxaparin  Head of bed >30 degrees: Yes  Ulcer ppx: Not indicated  Glucose within target range: Yes, in range    RASS at goal? No - adjusting sedation or order to reflect need  [place] Agitation Assessment Scale (RASS) : Alert and calm (07/10/2020 11:31 AM)     Can antipsychotics be stopped? N/A, not on antipsychotics  CAM-ICU Result: CAM-ICU Negative (07/10/2020  7:44 AM)      Designated Healthcare Decision Maker:  [name]'s current decisional capacity for healthcare decision-making is not Full capacity. His designated healthcare decision maker(s) is/are   HCDM (patient stated preference): Rush,Nicholas - Spouse - [phone].    Disposition:  Continue ICU care.    The patient is critically ill with the above problems. I personally spent 45 minutes, excluding procedures, in critical care time examining the patient, evaluating the hemodynamic, laboratory, and radiographic data, developing a comprehensive management plan, and serially assessing the patient's response to our critical care interventions.    Pablo Ledger, MD    HPI   [name] is a 55 y.o. male admitted after witnessed grand Mal seizure in the setting of EtOH intoxication. He has a past medical history significant for HTN, bipolar I??disorder, alcohol use??disorder and has had withdrawal seizures,??recurrent??C??diff??infection.   He is not able to provide much history to me today with his mentation but obtained from chart and team. He is about 49-72 hours from admission when his alcohol level was 295. Today is much more tremulous, altered and appears to be having worsening withdraw symptoms. Currently getting CIWA and on exam, protecting airway but clearly needs to be NPO.     A comprehensive review of systems was unable to be obtained due to patient's mental status.  MEDICAL HISTORY     Past Medical History:   Diagnosis Date   ??? Alcohol withdrawal syndrome, with delirium (CMS-HCC) 05/11/2012   ??? Anxiety    ??? Bipolar 1 disorder (CMS-HCC)    ??? Depressive disorder    ??? Erectile dysfunction    ??? Esophageal reflux    ??? History of pyloric stenosis    ??? Hypertension    ??? Insomnia    ??? Psoriasis     Dermatologist - Dr. Deloria Lair in [facility]   ??? Seizure (CMS-HCC) 02/20/2019   ??? Tobacco use     1ppd     Past Surgical History:   Procedure Laterality Date   ??? PYLOROMYOTOMY     ??? WISDOM TOOTH EXTRACTION       Family History   Problem Relation Age of Onset   ??? Bipolar disorder Mother    ??? Depression Sister    ??? Alcohol abuse Sister    ??? Alcohol abuse Maternal Aunt    ??? Melanoma Neg Hx    ??? Basal cell carcinoma Neg Hx    ??? Squamous cell carcinoma Neg Hx      Social History     Tobacco Use   ??? Smoking status: Current Every Day Smoker     Packs/day: 1.50     Years: 30.00     Pack years: 45.00     Types: Cigarettes   ??? Smokeless tobacco: Never Used   Vaping Use   ??? Vaping Use: Never used   Substance Use Topics   ??? Alcohol use: Yes     Comment: 3 drinks a day.   ??? Drug use: Yes     Types: Marijuana     Allergies as of 07/07/2020 - Reviewed 07/07/2020   Allergen Reaction Noted   ??? Tramadol  11/09/2019   ??? Hydroxyzine Itching 08/16/2014       DIAGNOSTIC REVIEW   Vent Settings:       Physical Exam:    General: altered, difficult to understand  Eyes: PERRL.  ENT: Tacky mucous membranes.  Lymph: No cervical or supraclavicular adenopathy.  Lungs: No wheezes or crackles.  Cardiovascular: Regular rate and rhythm, S1, S2 normal, no murmur, click, rub or gallop appreciated.  Abdomen: Soft, non-tender, not distended, bowel sounds are normal  Musculoskeletal: No clubbing and no synovitis.  Skin: No rashes or lesions.  Neuro: Delirious.    Devices and Wounds:  Patient Lines/Drains/Airways Status     Active Active Lines, Drains, & Airways     Name Placement date Placement time Site Days    External Urinary Catheter 07/09/20  0839  --  1    Peripheral IV 07/08/20 Right Hand 07/08/20  0433  Hand  2              Patient Lines/Drains/Airways Status     Active Wounds     None                Data Review:   I have reviewed the labs and studies from the last 24 hours.

## 2020-07-10 NOTE — Unmapped (Signed)
Family Medicine Inpatient Service    Progress Note    Team: Family Medicine Blue (pgr [phone])    Hospital Day: 2    ASSESSMENT / PLAN:   Nicholas Rush is a 55 y.o. male with a past medical history significant for HTN, bipolar I??disorder, alcohol use??disorder c/b withdrawal seizures,??recurrent??C??diff??infection who presents s/p witnessed grand Mal seizure for monitored alcohol withdrawal and is found to have new onset LEE.     ## Alcohol Use Disorder - hx complicated W/d  Last EtOH about 72 hours ago. This AM patient with florid withdrawal symptoms including autonomic instability, truncal tremor, and clouded sensorium. Now on severe withdrawal protocol and ICU status for nursing assessment frequency.   - Standing Ativan + prn assessment and Ativan prn per severe withdrawal protocol  - thiamine and folate daily  - zofran PRN  - melatonin at bedtime  - home gabapentin dose 200mg  BID  - patient desires detox but discharge home following stabilization  ??  ## Myotonic Clonic Seizure   Most likely provoked seizure 2/2 alcohol or infection. Spot EEG during recent hospitalization not suggestive of underlying pathology. Neurology suggests most consistent with provoked seizure and recommends titration of depakote to therapeutic level.   ??  ## Lower Extremity Edema  New onset edema without other HF symptoms. Differential includes new onset HF, renal dysfunction (primarily proteinuria), and portal hypertension. Given recent ECHO and lack of elevated BNP or orthopnea/PND will perform bedside U/S to rule out gross reduction in EF. UPC unremarkable. RUQ [place] mostly unremarkable, but liver dysfunction could be contributory factor to hypoalbuminemia.  - bedside ECHO  - start lasix 20mg  daily   ??  ##??Diarrhea:??  C diff negative. GIPP pending, but patient without stools since yesterday.   - continue to hold on bowel regimen      ## Bipolar I Disorder: well-controlled on Depakote and Zyprexa. ??  - continue zyprexa 20mg  at bedtime  - Will get level on VPA since adherence is questionable. Then consider titration.   ??  ##Paroxysmal atrial fibrillation Mag 1.5.    - Continue diltiazem CD 360 mg daily   - magnesium 2 g IV once, CTM  ??  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   - 1 g salt TID   - daily labs  ??  ## Tobacco use disorder: Patient currently smoking 1PPD. Will place inpatient consult to tobacco cessation for toxic effects of tobacco  - Tobacco cessation consult  - Nicotine replacement therapy with patch, gum, lozenge PRN  ??    # Checklist:  - IVF None  - Tubes/Lines/Drains: PIV, ext catheter  - Diet Regular  - Bowel Regimen: No indication for a bowel regimen at this time and loose stools recently  - DVT: SQ Lovenox  - Code Status:   Orders Placed This Encounter   Procedures   ??? Full Code     Standing Status:   Standing     Number of Occurrences:   1     - Dispo: ICU    [ ]  Anticipated Discharge Location: Home  [ ]  PT/OT/DME: PT/OT ordered  [ ]  CM/SW needs: Likely pending PT/OT recs, Substance abuse resources  [ ]  Meds/Rx:  Not yet prescribed. No special med needs  [ ]  Teaching: None anticipated  [ ]  Follow up appt: Appointment needed  [ ]  Excuse letter: None anticipated  [ ]  Transport: Private Needed    SUBJECTIVE:  Interval events: Patient reports feeling  chills, shaking. Later in AM is confused, responding inappropriately, and mumbles about a fire in a barn.      REVIEW OF SYSTEMS:  Pertinent positives and negatives per HPI. A complete review of systems otherwise negative.    PHYSICAL EXAM:      Intake/Output Summary (Last 24 hours) at 07/10/2020 1115  Last data filed at 07/10/2020 0400  Gross per 24 hour   Intake 985 ml   Output 1660 ml   Net -675 ml       Recent Vitals:  Vitals:    07/10/20 1100   BP: 139/116   Pulse: 116   Resp: 28   Temp: (!) 39.3 ??C   SpO2: 91%       GEN: Tremulous, Clammy, Sheets strewn about bed.      HEENT: NCAT, No scleral icterus. Conjunctiva non-erythematous. MMM.  CV: Tachycardic. No murmurs/rubs/gallops.  Pulm: Tachypneic. Satting well on RA. CTAB. No wheezing, crackles, or rhonchi.  Abd: Flat.  Nontender. No guarding, rebound.  Normoactive bowel sounds.    Neuro: A&O x 3. No focal deficits.  Ext: 3+ pitting edema on feet. 2+ edema to mid-shin bilaterally.  Palpable distal pulses.  Skin: No rashes or skin lesions.       LABS/ STUDIES:    All imaging, laboratory studies, and other pertinent tests including electrocardiography within the last 24 hours were reviewed and are summarized within the assessment and plan.     NUTRITION:                           Jolyn Nap, MD PGY1  July 09, 2020 10:48 AM

## 2020-07-10 NOTE — Unmapped (Signed)
Problem: Adult Inpatient Plan of Care  Goal: Plan of Care Review  Outcome: Progressing  Goal: Patient-Specific Goal (Individualized)  Outcome: Progressing  Goal: Absence of Hospital-Acquired Illness or Injury  Outcome: Progressing  Intervention: Identify and Manage Fall Risk  Recent Flowsheet Documentation  Taken 07/09/2020 2000 by Nestor Lewandowsky, RN  Safety Interventions:   bed alarm   fall reduction program maintained   family at bedside   isolation precautions   low bed   nonskid shoes/slippers when out of bed  Goal: Optimal Comfort and Wellbeing  Outcome: Progressing  Goal: Readiness for Transition of Care  Outcome: Progressing  Goal: Rounds/Family Conference  Outcome: Progressing     Problem: Infection  Goal: Absence of Infection Signs and Symptoms  Outcome: Progressing  Intervention: Prevent or Manage Infection  Recent Flowsheet Documentation  Taken 07/09/2020 2000 by Nestor Lewandowsky, RN  Isolation Precautions: enteric precautions maintained     Problem: Skin Injury Risk Increased  Goal: Skin Health and Integrity  Outcome: Progressing  Intervention: Optimize Skin Protection  Recent Flowsheet Documentation  Taken 07/09/2020 2000 by Nestor Lewandowsky, RN  Pressure Reduction Techniques: frequent weight shift encouraged  Head of Bed (HOB) Positioning: HOB at 30 degrees     Problem: Fall Injury Risk  Goal: Absence of Fall and Fall-Related Injury  Outcome: Progressing  Intervention: Promote Injury-Free Environment  Recent Flowsheet Documentation  Taken 07/09/2020 2000 by Nestor Lewandowsky, RN  Safety Interventions:   bed alarm   fall reduction program maintained   family at bedside   isolation precautions   low bed   nonskid shoes/slippers when out of bed     Problem: Self-Care Deficit  Goal: Improved Ability to Complete Activities of Daily Living  Outcome: Progressing     Problem: Behavioral Health Comorbidity  Goal: Maintenance of Behavioral Health Symptom Control  Outcome: Progressing     Problem: Heart Failure Comorbidity  Goal: Maintenance of Heart Failure Symptom Control  Outcome: Progressing     Problem: Hypertension Comorbidity  Goal: Blood Pressure in Desired Range  Outcome: Progressing

## 2020-07-11 LAB — CBC
HEMATOCRIT: 37.3 % — ABNORMAL LOW (ref 39.0–48.0)
HEMOGLOBIN: 13.2 g/dL (ref 12.9–16.5)
MEAN CORPUSCULAR HEMOGLOBIN CONC: 35.5 g/dL (ref 32.0–36.0)
MEAN CORPUSCULAR HEMOGLOBIN: 36.9 pg — ABNORMAL HIGH (ref 25.9–32.4)
MEAN CORPUSCULAR VOLUME: 104 fL — ABNORMAL HIGH (ref 77.6–95.7)
MEAN PLATELET VOLUME: 8.1 fL (ref 6.8–10.7)
PLATELET COUNT: 107 10*9/L — ABNORMAL LOW (ref 150–450)
RED BLOOD CELL COUNT: 3.58 10*12/L — ABNORMAL LOW (ref 4.26–5.60)
RED CELL DISTRIBUTION WIDTH: 15.6 % — ABNORMAL HIGH (ref 12.2–15.2)
WBC ADJUSTED: 6.7 10*9/L (ref 3.6–11.2)

## 2020-07-11 LAB — PHOSPHORUS: PHOSPHORUS: 3.2 mg/dL (ref 2.4–5.1)

## 2020-07-11 LAB — COMPREHENSIVE METABOLIC PANEL
ALBUMIN: 3 g/dL — ABNORMAL LOW (ref 3.4–5.0)
ALKALINE PHOSPHATASE: 67 U/L (ref 46–116)
ALT (SGPT): 13 U/L (ref 10–49)
ANION GAP: 8 mmol/L (ref 5–14)
AST (SGOT): 26 U/L (ref <=34)
BILIRUBIN TOTAL: 0.6 mg/dL (ref 0.3–1.2)
BLOOD UREA NITROGEN: 9 mg/dL (ref 9–23)
BUN / CREAT RATIO: 11
CALCIUM: 8.5 mg/dL — ABNORMAL LOW (ref 8.7–10.4)
CHLORIDE: 99 mmol/L (ref 98–107)
CO2: 24.1 mmol/L (ref 20.0–31.0)
CREATININE: 0.81 mg/dL
EGFR CKD-EPI (2021) MALE: >90 mL/min/{1.73_m2} (ref >=60)
GLUCOSE RANDOM: 101 mg/dL (ref 70–179)
POTASSIUM: 4.2 mmol/L (ref 3.4–4.8)
PROTEIN TOTAL: 7.5 g/dL (ref 5.7–8.2)
SODIUM: 131 mmol/L — ABNORMAL LOW (ref 135–145)

## 2020-07-11 LAB — MAGNESIUM: MAGNESIUM: 1.7 mg/dL (ref 1.6–2.6)

## 2020-07-11 LAB — VALPROIC ACID LEVEL, TOTAL: VALPROIC ACID TOTAL: 47.3 ug/mL — ABNORMAL LOW (ref 50.0–100.0)

## 2020-07-11 MED ADMIN — LORazepam (ATIVAN) injection 4 mg: 4 mg | INTRAVENOUS | @ 02:00:00

## 2020-07-11 MED ADMIN — nicotine (NICODERM CQ) 21 mg/24 hr patch 1 patch: 1 | TRANSDERMAL | @ 20:00:00

## 2020-07-11 MED ADMIN — gabapentin (NEURONTIN) capsule 200 mg: 200 mg | ORAL | @ 02:00:00

## 2020-07-11 MED ADMIN — enoxaparin (LOVENOX) syringe 40 mg: 40 mg | SUBCUTANEOUS | @ 20:00:00

## 2020-07-11 MED ADMIN — LORazepam (ATIVAN) injection 4 mg: 4 mg | INTRAVENOUS | @ 04:00:00

## 2020-07-11 MED ADMIN — magnesium sulfate 2gm/50mL IVPB: 2 g | INTRAVENOUS | @ 19:00:00 | Stop: 2020-07-11

## 2020-07-11 MED ADMIN — LORazepam (ATIVAN) injection 4 mg: 4 mg | INTRAVENOUS | @ 10:00:00

## 2020-07-11 MED ADMIN — thiamine (B-1) injection 200 mg: 200 mg | INTRAVENOUS | @ 20:00:00

## 2020-07-11 MED ADMIN — LORazepam (ATIVAN) injection 4 mg: 4 mg | INTRAVENOUS | @ 22:00:00

## 2020-07-11 MED ADMIN — acetaminophen (OFIRMEV) 10 mg/mL injection 650 mg 65 mL: 650 mg | INTRAVENOUS | @ 18:00:00 | Stop: 2020-07-11

## 2020-07-11 MED ADMIN — LORazepam (ATIVAN) injection 4 mg: 4 mg | INTRAVENOUS | @ 06:00:00

## 2020-07-11 MED ADMIN — OLANZapine (ZyPREXA) tablet 20 mg: 20 mg | ORAL | @ 02:00:00

## 2020-07-11 MED ADMIN — divalproex ER (DEPAKOTE ER) extended release 24 hr tablet 750 mg: 750 mg | ORAL | @ 02:00:00

## 2020-07-11 NOTE — Unmapped (Signed)
Pt remains on 02 via [place]. Pt tolerates chest vest well. Pt noted to have fair, congested cough during chest vest therapy.

## 2020-07-11 NOTE — Unmapped (Signed)
Family Medicine Inpatient Service    Progress Note    Team: Family Medicine Blue (pgr [phone])    Hospital Day: 3    ASSESSMENT / PLAN:   Nicholas Rush is a 55 y.o. male with a past medical history significant for HTN, bipolar I??disorder, alcohol use??disorder c/b withdrawal seizures,??recurrent??C??diff??infection who presents s/p witnessed grand Mal seizure for monitored alcohol withdrawal and is found to have new onset LEE.     ## Alcohol Use Disorder - hx complicated W/d  Last EtOH a little over 72 hours ago. Patient continues to have vital sign changes and clouded sensorium, but tremor is improved from yesterday. Yesterday was put on moderate withdrawal protocol after assessed to be over sedated with severe protocol standing Ativan. Will continue with hopes to begin taper tomorrow.   - Standing Ativan + prn assessment and Ativan prn per moderate withdrawal protocol  - thiamine and folate daily  - zofran PRN  - melatonin at bedtime  - home gabapentin dose 200mg  BID  - CM to provide resources. Will consider naltrexone.  ??  ## Myotonic Clonic Seizure   Most likely provoked seizure 2/2 alcohol or infection. Spot EEG during recent hospitalization not suggestive of underlying pathology. Neurology suggests most consistent with provoked seizure and recommends titration of depakote to therapeutic level.     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 Patient had psych admission where they attempted to switch to lithium, but patient did not tolerate. He has not been stable on antipsychotic monotherapy and has had reasonable stability with subtherapeutic dose of VPA.   ??  ## Lower Extremity Edema  New onset edema without other HF symptoms. Differential includes new onset HF, renal dysfunction (primarily proteinuria), and portal hypertension. Bedside ECHO unremarkable. UPC unremarkable. RUQ [place] mostly unremarkable, but liver dysfunction could be contributory factor to hypoalbuminemia. Has been improving with lasix.  - lasix 20mg  daily   ??  ##??Diarrhea:??  C diff negative. Patient without stool for two days. Will consider lactulose for prevention of HE.      ## Bipolar I Disorder: well-controlled on Depakote and Zyprexa. VPA subtherapeutic, but will not increase due to concern for SIADH and decreasing sodium level.   - continue zyprexa 20mg  at bedtime  ??  ##Paroxysmal atrial fibrillation Mag 1.7.    - Continue diltiazem CD 360 mg daily   - magnesium 2 g IV once, CTM  ??  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 Worsening to 131 on 6/23  - 1 g salt TID   - daily labs  ??  ## Tobacco use disorder: Patient currently smoking 1PPD. Will place inpatient consult to tobacco cessation for toxic effects of tobacco  - Tobacco cessation consult  - Nicotine replacement therapy with patch, gum, lozenge PRN  ??    # Checklist:  - IVF None  - Tubes/Lines/Drains: PIV, ext catheter  - Diet Regular  - Bowel Regimen: No indication for a bowel regimen at this time and loose stools recently  - DVT: SQ Lovenox  - Code Status:   Orders Placed This Encounter   Procedures   ??? Full Code     Standing Status:   Standing     Number of Occurrences:   1     - Dispo: ICU    [ ]  Anticipated Discharge Location: Home  [ ]  PT/OT/DME: PT/OT ordered  [ ]  CM/SW needs: Likely pending PT/OT recs, Substance abuse  resources  [ ]  Meds/Rx:  Not yet prescribed. No special med needs  [ ]  Teaching: None anticipated  [ ]  Follow up appt: Appointment needed  [ ]  Excuse letter: None anticipated  [ ]  Transport: Private Needed    SUBJECTIVE:  Interval events: Patient is drowsy, but easily arousable, and does not answer questions appropriately.       REVIEW OF SYSTEMS:  Pertinent positives and negatives per HPI. A complete review of systems otherwise negative.    PHYSICAL EXAM:      Intake/Output Summary (Last 24 hours) at 07/11/2020 1121  Last data filed at 07/10/2020 2000  Gross per 24 hour   Intake 50 ml   Output 250 ml   Net -200 ml       Recent Vitals:  Vitals:    07/11/20 0900   BP:    Pulse: 122   Resp: 25   Temp:    SpO2: 95%       GEN: Sitting in bed, drowsy, Sheets strewn about bed.      HEENT: NCAT, No scleral icterus. Conjunctiva non-erythematous. MMM.  CV: Tachycardic. No murmurs/rubs/gallops.  Pulm: Tachypneic. Satting well on RA. CTAB. Rhonchi present, coughing up sputum.   Abd: Flat.  Nontender. No guarding, rebound.  Normoactive bowel sounds.    Neuro: A&O x 1. No focal deficits.  Ext: 1+ pitting edema on feet. 1+ edema to mid-shin bilaterally.  Palpable distal pulses.  Skin: No rashes or skin lesions.       LABS/ STUDIES:    All imaging, laboratory studies, and other pertinent tests including electrocardiography within the last 24 hours were reviewed and are summarized within the assessment and plan.     NUTRITION:                           Jolyn Nap, MD PGY1  July 09, 2020 10:48 AM

## 2020-07-11 NOTE — Unmapped (Signed)
Valproate and Derivatives Therapeutic Monitoring Pharmacy Note    Eula Listen III is a 55 y.o. male continuing valproic acid.    Indication: Mood stabilization    Prior Dosing Information: Home regimen Depakote ER 750 mg nightly; regimen the same inpatient    Goals:  Therapeutic Drug Levels  50 - 125 mcg/mL (mood stabilization).    Additional clinical monitoring/outcomes:   Pregnancy status (where indicated), CBC with differential (platelets/ANC), sodium level, hepatic function, amylase/lipase, ammonia level with encephalopathy    Results: Valproic Acid level 47.3 mcg/mL, drawn appropriately    Pharmacokinetic Information/Significant Interacting Medications:  ??? Interacting medications: none  ??? Lamotrigine: Valproate derivatives are known to increase lamotrigine concentrations. Recommended dose reduction and titration protocols are provided in the package insert  ??? Lorazepam: Valproate derivatives are known to increase serum exposure to lorazepam. Monitoring for lorazepam over-exposure and dose reductions may be required.     Assessment/Plan:  Recommendation(s)   Continue current dose of Depakote ER 750 mg nightly   Will not adjust dose at this time given acute hyponatremia    Follow-up  ??? Valproic acid level: not applicable.   ??? Steady-state trough serum valproic acid concentrations should be obtained within 3 - 5 days after initiation, dosing changes or addition of interacting medications.   ??? Male patients of child-bearing age: a daily supplemental folic acid dose of 1 - 4 mg (1000 - 4000 micrograms) is generally recommended for the prevention of neural tube defects (NTD) in male patients of child-bearing age receiving valproate    Formulation-specific Valproic Acid Derivative Monitoring Recommendations:  ??? Divalproex Delayed-release or Valproate Immediate-release (twice daily dosing): obtain serum concentrations 12 hours after the preceding dose  ??? Divalproex Extended-release (nightly dosing): obtain serum concentrations at least 18 hours after preceding dose  ??? Divalproex Extended-release (morning dosing): obtain serum concentrations as a 24-hr trough, immediately prior to next dose    Pharmacy will continue to follow patient with team an recommend levels as appropriate.    Karren Burly, PharmD

## 2020-07-11 NOTE — Unmapped (Signed)
Problem: Adult Inpatient Plan of Care  Goal: Plan of Care Review  Outcome: Progressing  Goal: Patient-Specific Goal (Individualized)  Outcome: Progressing  Goal: Absence of Hospital-Acquired Illness or Injury  Outcome: Progressing  Intervention: Identify and Manage Fall Risk  Recent Flowsheet Documentation  Taken 07/11/2020 0400 by Nestor Lewandowsky, RN  Safety Interventions:   aspiration precautions   bed alarm   fall reduction program maintained   low bed   nonskid shoes/slippers when out of bed   seizure precautions  Taken 07/11/2020 0200 by Nestor Lewandowsky, RN  Safety Interventions:   aspiration precautions   fall reduction program maintained   low bed   bed alarm   nonskid shoes/slippers when out of bed   seizure precautions  Taken 07/11/2020 0000 by Nestor Lewandowsky, RN  Safety Interventions:   aspiration precautions   bed alarm   fall reduction program maintained   low bed   nonskid shoes/slippers when out of bed   seizure precautions  Taken 07/10/2020 2200 by Nestor Lewandowsky, RN  Safety Interventions:   aspiration precautions   bed alarm   fall reduction program maintained   low bed   nonskid shoes/slippers when out of bed   seizure precautions  Taken 07/10/2020 2000 by Nestor Lewandowsky, RN  Safety Interventions:   aspiration precautions   bed alarm   fall reduction program maintained   low bed   nonskid shoes/slippers when out of bed   seizure precautions  Goal: Optimal Comfort and Wellbeing  Outcome: Progressing  Goal: Readiness for Transition of Care  Outcome: Progressing  Goal: Rounds/Family Conference  Outcome: Progressing     Problem: Infection  Goal: Absence of Infection Signs and Symptoms  Outcome: Progressing     Problem: Skin Injury Risk Increased  Goal: Skin Health and Integrity  Outcome: Progressing  Intervention: Optimize Skin Protection  Recent Flowsheet Documentation  Taken 07/11/2020 0400 by Nestor Lewandowsky, RN  Pressure Reduction Techniques: frequent weight shift encouraged  Taken 07/11/2020 0200 by Nestor Lewandowsky, RN  Pressure Reduction Techniques: frequent weight shift encouraged  Taken 07/11/2020 0000 by Nestor Lewandowsky, RN  Pressure Reduction Techniques: frequent weight shift encouraged  Taken 07/10/2020 2200 by Nestor Lewandowsky, RN  Pressure Reduction Techniques: frequent weight shift encouraged  Taken 07/10/2020 2000 by Nestor Lewandowsky, RN  Pressure Reduction Techniques: frequent weight shift encouraged     Problem: Fall Injury Risk  Goal: Absence of Fall and Fall-Related Injury  Outcome: Progressing  Intervention: Promote Injury-Free Environment  Recent Flowsheet Documentation  Taken 07/11/2020 0400 by Nestor Lewandowsky, RN  Safety Interventions:   aspiration precautions   bed alarm   fall reduction program maintained   low bed   nonskid shoes/slippers when out of bed   seizure precautions  Taken 07/11/2020 0200 by Nestor Lewandowsky, RN  Safety Interventions:   aspiration precautions   fall reduction program maintained   low bed   bed alarm   nonskid shoes/slippers when out of bed   seizure precautions  Taken 07/11/2020 0000 by Nestor Lewandowsky, RN  Safety Interventions:   aspiration precautions   bed alarm   fall reduction program maintained   low bed   nonskid shoes/slippers when out of bed   seizure precautions  Taken 07/10/2020 2200 by Nestor Lewandowsky, RN  Safety Interventions:   aspiration precautions   bed alarm   fall reduction program maintained   low bed   nonskid shoes/slippers when out of bed   seizure precautions  Taken 07/10/2020 2000 by Nestor Lewandowsky, RN  Safety Interventions:   aspiration precautions   bed alarm   fall reduction program maintained   low bed   nonskid shoes/slippers when out of bed   seizure precautions     Problem: Self-Care Deficit  Goal: Improved Ability to Complete Activities of Daily Living  Outcome: Progressing     Problem: Behavioral Health Comorbidity  Goal: Maintenance of Behavioral Health Symptom Control  Outcome: Progressing     Problem: Heart Failure Comorbidity  Goal: Maintenance of Heart Failure Symptom Control  Outcome: Progressing Problem: Hypertension Comorbidity  Goal: Blood Pressure in Desired Range  Outcome: Progressing

## 2020-07-12 LAB — CBC
HEMATOCRIT: 37.4 % — ABNORMAL LOW (ref 39.0–48.0)
HEMOGLOBIN: 13 g/dL (ref 12.9–16.5)
MEAN CORPUSCULAR HEMOGLOBIN CONC: 34.8 g/dL (ref 32.0–36.0)
MEAN CORPUSCULAR HEMOGLOBIN: 37.1 pg — ABNORMAL HIGH (ref 25.9–32.4)
MEAN CORPUSCULAR VOLUME: 106.5 fL — ABNORMAL HIGH (ref 77.6–95.7)
MEAN PLATELET VOLUME: 8 fL (ref 6.8–10.7)
PLATELET COUNT: 83 10*9/L — ABNORMAL LOW (ref 150–450)
RED BLOOD CELL COUNT: 3.51 10*12/L — ABNORMAL LOW (ref 4.26–5.60)
RED CELL DISTRIBUTION WIDTH: 15 % (ref 12.2–15.2)
WBC ADJUSTED: 4.2 10*9/L (ref 3.6–11.2)

## 2020-07-12 LAB — COMPREHENSIVE METABOLIC PANEL
ALBUMIN: 2.9 g/dL — ABNORMAL LOW (ref 3.4–5.0)
ALKALINE PHOSPHATASE: 56 U/L (ref 46–116)
ALT (SGPT): 17 U/L (ref 10–49)
ANION GAP: 10 mmol/L (ref 5–14)
AST (SGOT): 40 U/L — ABNORMAL HIGH (ref <=34)
BILIRUBIN TOTAL: 0.4 mg/dL (ref 0.3–1.2)
BLOOD UREA NITROGEN: 11 mg/dL (ref 9–23)
BUN / CREAT RATIO: 15
CALCIUM: 8.2 mg/dL — ABNORMAL LOW (ref 8.7–10.4)
CHLORIDE: 98 mmol/L (ref 98–107)
CO2: 23.5 mmol/L (ref 20.0–31.0)
CREATININE: 0.74 mg/dL
EGFR CKD-EPI (2021) MALE: >90 mL/min/{1.73_m2} (ref >=60)
GLUCOSE RANDOM: 85 mg/dL (ref 70–179)
POTASSIUM: 3.9 mmol/L (ref 3.4–4.8)
PROTEIN TOTAL: 7.2 g/dL (ref 5.7–8.2)
SODIUM: 131 mmol/L — ABNORMAL LOW (ref 135–145)

## 2020-07-12 LAB — VITAMIN B1, WHOLE BLOOD: VITAMIN B1: 365 nmol/L — ABNORMAL HIGH

## 2020-07-12 LAB — MAGNESIUM: MAGNESIUM: 1.5 mg/dL — ABNORMAL LOW (ref 1.6–2.6)

## 2020-07-12 LAB — PHOSPHORUS: PHOSPHORUS: 3.8 mg/dL (ref 2.4–5.1)

## 2020-07-12 MED ADMIN — gabapentin (NEURONTIN) capsule 200 mg: 200 mg | ORAL | @ 02:00:00

## 2020-07-12 MED ADMIN — potassium chloride 10 mEq in 100 mL IVPB: 10 meq | INTRAVENOUS | @ 14:00:00 | Stop: 2020-07-12

## 2020-07-12 MED ADMIN — OLANZapine (ZyPREXA) tablet 20 mg: 20 mg | ORAL | @ 02:00:00

## 2020-07-12 MED ADMIN — LORazepam (ATIVAN) injection 4 mg: 4 mg | INTRAVENOUS | @ 07:00:00 | Stop: 2020-07-12

## 2020-07-12 MED ADMIN — LORazepam (ATIVAN) injection 4 mg: 4 mg | INTRAVENOUS | @ 03:00:00

## 2020-07-12 MED ADMIN — ampicillin-sulbactam (UNASYN) 3 g in sodium chloride 0.9 % (NS) 100 mL IVPB connector bag: 3 g | INTRAVENOUS | @ 19:00:00 | Stop: 2020-07-16

## 2020-07-12 MED ADMIN — acetaminophen (OFIRMEV) 10 mg/mL injection 1,000 mg: 1000 mg | INTRAVENOUS | @ 09:00:00 | Stop: 2020-07-12

## 2020-07-12 MED ADMIN — dilTIAZem (CARDIZEM) injection 10 mg: 10 mg | INTRAVENOUS | @ 14:00:00 | Stop: 2020-07-12

## 2020-07-12 MED ADMIN — LORazepam (ATIVAN) injection 2 mg: 2 mg | INTRAVENOUS | @ 19:00:00 | Stop: 2020-07-12

## 2020-07-12 MED ADMIN — potassium chloride 10 mEq in 100 mL IVPB: 10 meq | INTRAVENOUS | @ 19:00:00 | Stop: 2020-07-12

## 2020-07-12 MED ADMIN — thiamine (B-1) injection 200 mg: 200 mg | INTRAVENOUS | @ 14:00:00

## 2020-07-12 MED ADMIN — magnesium sulfate 2gm/50mL IVPB: 2 g | INTRAVENOUS | @ 15:00:00 | Stop: 2020-07-12

## 2020-07-12 MED ADMIN — dilTIAZem (CARDIZEM) 50 mg in sodium chloride (NS) 0.9 % 50 mL infusion: 10 mg/h | INTRAVENOUS | @ 15:00:00 | Stop: 2020-07-12

## 2020-07-12 MED ADMIN — dextrose 5 % and sodium chloride 0.9 % infusion: 125 mL/h | INTRAVENOUS | @ 11:00:00 | Stop: 2020-07-13

## 2020-07-12 MED ADMIN — LORazepam (ATIVAN) injection 4 mg: 4 mg | INTRAVENOUS | @ 09:00:00 | Stop: 2020-07-12

## 2020-07-12 MED ADMIN — LORazepam (ATIVAN) injection 2 mg: 2 mg | INTRAVENOUS | @ 14:00:00 | Stop: 2020-07-12

## 2020-07-12 MED ADMIN — magnesium sulfate 2gm/50mL IVPB: 2 g | INTRAVENOUS | @ 18:00:00 | Stop: 2020-07-12

## 2020-07-12 MED ADMIN — ampicillin-sulbactam (UNASYN) 3 g in sodium chloride 0.9 % (NS) 100 mL IVPB connector bag: 3 g | INTRAVENOUS | @ 07:00:00 | Stop: 2020-07-16

## 2020-07-12 MED ADMIN — potassium chloride 10 mEq in 100 mL IVPB: 10 meq | INTRAVENOUS | @ 17:00:00 | Stop: 2020-07-12

## 2020-07-12 MED ADMIN — acetaminophen (OFIRMEV) 10 mg/mL injection 1,000 mg: 1000 mg | INTRAVENOUS | @ 17:00:00 | Stop: 2020-07-12

## 2020-07-12 MED ADMIN — divalproex ER (DEPAKOTE ER) extended release 24 hr tablet 750 mg: 750 mg | ORAL | @ 02:00:00

## 2020-07-12 MED ADMIN — dilTIAZem (CARDIZEM) 125 mg in sodium chloride (NS) 0.9 % 125 mL infusion: 10 mg/h | INTRAVENOUS | @ 22:00:00

## 2020-07-12 MED ADMIN — melatonin tablet 3 mg: 3 mg | ORAL | @ 02:00:00

## 2020-07-12 MED ADMIN — dextrose 5 % and sodium chloride 0.9 % infusion: 125 mL/h | INTRAVENOUS | @ 18:00:00 | Stop: 2020-07-13

## 2020-07-12 MED ADMIN — nicotine (NICODERM CQ) 21 mg/24 hr patch 1 patch: 1 | TRANSDERMAL | @ 14:00:00

## 2020-07-12 MED ADMIN — acetaminophen (OFIRMEV) 10 mg/mL injection 1,000 mg: 1000 mg | INTRAVENOUS | @ 02:00:00 | Stop: 2020-07-12

## 2020-07-12 MED ADMIN — ampicillin-sulbactam (UNASYN) 3 g in sodium chloride 0.9 % (NS) 100 mL IVPB connector bag: 3 g | INTRAVENOUS | @ 14:00:00 | Stop: 2020-07-16

## 2020-07-12 MED ADMIN — enoxaparin (LOVENOX) syringe 40 mg: 40 mg | SUBCUTANEOUS | @ 14:00:00

## 2020-07-12 MED ADMIN — ampicillin-sulbactam (UNASYN) 3 g in sodium chloride 0.9 % (NS) 100 mL IVPB connector bag: 3 g | INTRAVENOUS | Stop: 2020-07-16

## 2020-07-12 MED ADMIN — lactated ringers bolus 500 mL: 500 mL | INTRAVENOUS | Stop: 2020-07-11

## 2020-07-12 NOTE — Unmapped (Signed)
Problem: Adult Inpatient Plan of Care  Goal: Plan of Care Review  Outcome: Ongoing - Unchanged  Goal: Patient-Specific Goal (Individualized)  Outcome: Ongoing - Unchanged  Goal: Absence of Hospital-Acquired Illness or Injury  Outcome: Ongoing - Unchanged  Intervention: Identify and Manage Fall Risk  Recent Flowsheet Documentation  Taken 07/11/2020 0800 by Gorden Harms, RN  Safety Interventions:   aspiration precautions   bed alarm   environmental modification   fall reduction program maintained   infection management   lighting adjusted for tasks/safety   low bed   room near unit station   toileting scheduled  Intervention: Prevent Skin Injury  Recent Flowsheet Documentation  Taken 07/11/2020 0800 by Gorden Harms, RN  Skin Protection:   adhesive use limited   cleansing with dimethicone incontinence wipes   protective footwear used   transparent dressing maintained   tubing/devices free from skin contact  Intervention: Prevent and Manage VTE (Venous Thromboembolism) Risk  Recent Flowsheet Documentation  Taken 07/11/2020 0800 by Gorden Harms, RN  Activity Management: bedrest  VTE Prevention/Management: anticoagulant therapy  Range of Motion: Bilateral Upper and Lower Extremities  Intervention: Prevent Infection  Recent Flowsheet Documentation  Taken 07/11/2020 0800 by Gorden Harms, RN  Infection Prevention:   cohorting utilized   environmental surveillance performed   equipment surfaces disinfected   hand hygiene promoted   personal protective equipment utilized   rest/sleep promoted   single patient room provided  Goal: Optimal Comfort and Wellbeing  Outcome: Ongoing - Unchanged  Goal: Readiness for Transition of Care  Outcome: Ongoing - Unchanged  Goal: Rounds/Family Conference  Outcome: Ongoing - Unchanged     Problem: Infection  Goal: Absence of Infection Signs and Symptoms  Outcome: Ongoing - Unchanged  Intervention: Prevent or Manage Infection  Recent Flowsheet Documentation  Taken 07/11/2020 0800 by Gorden Harms, RN  Infection Management: aseptic technique maintained     Problem: Skin Injury Risk Increased  Goal: Skin Health and Integrity  Outcome: Ongoing - Unchanged  Intervention: Optimize Skin Protection  Recent Flowsheet Documentation  Taken 07/11/2020 1800 by Gorden Harms, RN  Pressure Reduction Techniques:   frequent weight shift encouraged   heels elevated off bed   weight shift assistance provided  Pressure Reduction Devices:   positioning supports utilized   pressure-redistributing mattress utilized  Taken 07/11/2020 1600 by Gorden Harms, RN  Pressure Reduction Techniques:   frequent weight shift encouraged   heels elevated off bed   weight shift assistance provided  Pressure Reduction Devices:   positioning supports utilized   pressure-redistributing mattress utilized  Taken 07/11/2020 1400 by Gorden Harms, RN  Pressure Reduction Techniques:   frequent weight shift encouraged   heels elevated off bed   weight shift assistance provided  Pressure Reduction Devices:   positioning supports utilized   pressure-redistributing mattress utilized  Taken 07/11/2020 1200 by Gorden Harms, RN  Pressure Reduction Techniques:   frequent weight shift encouraged   heels elevated off bed   weight shift assistance provided  Pressure Reduction Devices:   positioning supports utilized   pressure-redistributing mattress utilized  Taken 07/11/2020 1000 by Gorden Harms, RN  Pressure Reduction Techniques:   frequent weight shift encouraged   heels elevated off bed   weight shift assistance provided  Pressure Reduction Devices:   positioning supports utilized   pressure-redistributing mattress utilized  Taken 07/11/2020 0800 by Gorden Harms, RN  Pressure Reduction Techniques:   frequent weight shift encouraged   heels elevated off bed   weight shift assistance provided  Head of Bed (HOB) Positioning: HOB at 30 degrees  Pressure Reduction Devices:   positioning supports utilized pressure-redistributing mattress utilized   specialty bed utilized  Skin Protection:   adhesive use limited   cleansing with dimethicone incontinence wipes   protective footwear used   transparent dressing maintained   tubing/devices free from skin contact     Problem: Fall Injury Risk  Goal: Absence of Fall and Fall-Related Injury  Outcome: Ongoing - Unchanged  Intervention: Promote Scientist, clinical (histocompatibility and immunogenetics) Documentation  Taken 07/11/2020 0800 by Gorden Harms, RN  Safety Interventions:   aspiration precautions   bed alarm   environmental modification   fall reduction program maintained   infection management   lighting adjusted for tasks/safety   low bed   room near unit station   toileting scheduled     Problem: Self-Care Deficit  Goal: Improved Ability to Complete Activities of Daily Living  Outcome: Ongoing - Unchanged     Problem: Behavioral Health Comorbidity  Goal: Maintenance of Behavioral Health Symptom Control  Outcome: Ongoing - Unchanged     Problem: Heart Failure Comorbidity  Goal: Maintenance of Heart Failure Symptom Control  Outcome: Ongoing - Unchanged     Problem: Hypertension Comorbidity  Goal: Blood Pressure in Desired Range  Outcome: Ongoing - Unchanged

## 2020-07-12 NOTE — Unmapped (Cosign Needed)
Family Medicine Inpatient Service    Progress Note    Team: [facility] (pgr [phone])    Hospital Day: 4    ASSESSMENT / PLAN:   Nicholas Rush is a 55 y.o. male with a past medical history significant for HTN, bipolar I??disorder, alcohol use??disorder c/b withdrawal seizures,??recurrent??C??diff??infection who presents s/p witnessed grand Mal seizure for monitored alcohol withdrawal and is found to have new onset LEE.     ## Alcohol Use Disorder - hx complicated W/d  Last EtOH ~ 96 hours ago. Patient remains tachycardic and has cyclic fever, but is overall quite sedated. Yesterday he went an extended period without Ativan due to sedation, but awoke with significant agitation around 5pm. Will decrease scheduled ativan in attempt to avoid held doses for sedation which continuing to avoid agitation.   - Standing Ativan 2 gm q4h + prn assessment and Ativan prn per moderate withdrawal protocol  - thiamine and folate daily  - zofran PRN  - melatonin at bedtime  - home gabapentin dose 200mg  BID  - CM to provide resources. Will consider naltrexone.    ## Fever  Significant rhonchi, reduced ability to clear secretions, fever, and CXR with left basal consolidation concerning for aspiration pneumonia. Started Unasyn 6/23. Patient continues to be febrile. If continues to fever, will switch to vanc/cefepime for MRSA and pseudomonas coverage given risk for HAP.   -Unasyn 3g q6h    ## Paroxysmal Afib  Patient entered Afib with RVR 6/24 AM. His PO diltiazem had been held for AMS. Gave two bolus doses of diltiazem 10 mg IV and placed on drip at 10 mg/hr for 24 hours. Mg 1.5 today  - diltiazem 10 mg/hr for 24 hours  - Mg 4g IV today??    ## Myotonic Clonic Seizure   Most likely provoked seizure 2/2 alcohol or infection. Spot EEG during recent hospitalization not suggestive of underlying pathology. Neurology suggests most consistent with provoked seizure and recommends titration of depakote to therapeutic level.     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 Patient had psych admission where they attempted to switch to lithium, but patient did not tolerate. He has not been stable on antipsychotic monotherapy and has had reasonable stability with subtherapeutic dose of VPA.   - 1 g salt tab TID  - daily CMP  ??  ## Lower Extremity Edema  New onset edema without other HF symptoms. Differential includes new onset HF, renal dysfunction (primarily proteinuria), and portal hypertension. Bedside ECHO unremarkable. UPC unremarkable. RUQ [place] mostly unremarkable, but liver dysfunction could be contributory factor to hypoalbuminemia. Improved with Lasix     ??  ##??Diarrhea:??  C diff negative. Patient without stool for two days. Will consider lactulose for prevention of HE.      ## Bipolar I Disorder: well-controlled on Depakote and Zyprexa. VPA subtherapeutic, but will not increase due to concern for SIADH and decreasing sodium level.   - continue zyprexa 20mg  at bedtime    ??  ## Tobacco use disorder: Patient currently smoking 1PPD. Will place inpatient consult to tobacco cessation for toxic effects of tobacco  - Tobacco cessation consult  - Nicotine replacement therapy with patch, gum, lozenge PRN  ??    # Checklist:  - IVF None  - Tubes/Lines/Drains: PIV, ext catheter  - Diet Regular  - Bowel Regimen: No indication for a bowel regimen at this time and loose stools recently  - DVT: SQ Lovenox  -  Code Status:   Orders Placed This Encounter   Procedures   ??? Full Code     Standing Status:   Standing     Number of Occurrences:   1     - Dispo: ICU    [ ]  Anticipated Discharge Location: Home  [ ]  PT/OT/DME: PT/OT ordered  [ ]  CM/SW needs: Likely pending PT/OT recs, Substance abuse resources  [ ]  Meds/Rx:  Not yet prescribed. No special med needs  [ ]  Teaching: None anticipated  [ ]  Follow up appt: Appointment needed  [ ]  Excuse letter: None anticipated  [ ]  Transport: Private Needed    SUBJECTIVE:  Interval events: Patient is arousable, but sedated and falls asleep quickly.      REVIEW OF SYSTEMS:  Pertinent positives and negatives per HPI. A complete review of systems otherwise negative.    PHYSICAL EXAM:      Intake/Output Summary (Last 24 hours) at 07/12/2020 1002  Last data filed at 07/12/2020 0600  Gross per 24 hour   Intake 400 ml   Output 120 ml   Net 280 ml       Recent Vitals:  Vitals:    07/12/20 0900   BP:    Pulse: 107   Resp: 22   Temp: (!) 38.6 ??C   SpO2: 98%       GEN: Sitting in bed, drowsy, Sheets strewn about bed.      HEENT: NCAT, No scleral icterus. Conjunctiva non-erythematous. MMM.  CV: Tachycardic. No murmurs/rubs/gallops.  Pulm: Tachypneic. Satting well on RA. CTAB. Rhonchi present, coughing up sputum.   Abd: Flat.  Nontender. No guarding, rebound.  Normoactive bowel sounds.    Neuro: A&O x 1. No focal deficits.  Ext: No peripheral edema. Palpable distal pulses.  Skin: No rashes or skin lesions.       LABS/ STUDIES:    All imaging, laboratory studies, and other pertinent tests including electrocardiography within the last 24 hours were reviewed and are summarized within the assessment and plan.     NUTRITION:                           Jolyn Nap, MD PGY1  July 09, 2020 10:48 AM

## 2020-07-12 NOTE — Unmapped (Signed)
Problem: Adult Inpatient Plan of Care  Goal: Plan of Care Review  Outcome: Ongoing - Unchanged   Received on nasal cannula with no complications noted. Chest was done as ordered

## 2020-07-12 NOTE — Unmapped (Signed)
Pt stable, no major events overnight.   Neuro: Pt remains very drowsy, oriented to self only, follows very limited commands (hand grasp mainly), and PERRL. CIWA Q4H, scores have been <10 and in addition pt has scheduled Ativan Q4H.   CV: SD status. HR, NSR-ST, 90-120s. Pt has been hypertensive for most of the night. SBP 160-190s/DBP 80-100s. MD informed, no new orders placed. Febrile, TMax, 38.6, prn of IV tylenol given and pt is now afebrile. Pulses palpable. Edema to BLE.   Resp: 2L [place], no desaturations noted.   GI/GU: Regular diet with poor PO intake. Was able to get pt to take pills by mouth with no complications. + 1 large occurrence of urine overnight. Last BM 6/22. Q6H BG, pt has been in the 90s overnight.   Lines: 20g LUA, flushed/patent.   Wounds: Pt was bathed on day shift. Scattered bruising.   Q2H turns. Pt safe/free from falls.       Problem: Adult Inpatient Plan of Care  Goal: Plan of Care Review  Outcome: Not Progressing  Goal: Patient-Specific Goal (Individualized)  Outcome: Not Progressing  Goal: Absence of Hospital-Acquired Illness or Injury  Outcome: Not Progressing  Intervention: Identify and Manage Fall Risk  Recent Flowsheet Documentation  Taken 07/12/2020 0400 by Betsey Holiday, RN  Safety Interventions:   aspiration precautions   bed alarm   bleeding precautions   fall reduction program maintained   low bed   nonskid shoes/slippers when out of bed  Taken 07/12/2020 0200 by Betsey Holiday, RN  Safety Interventions:   aspiration precautions   bed alarm   bleeding precautions   fall reduction program maintained   low bed   nonskid shoes/slippers when out of bed  Taken 07/12/2020 0000 by Betsey Holiday, RN  Safety Interventions:   aspiration precautions   bed alarm   bleeding precautions   fall reduction program maintained   low bed   nonskid shoes/slippers when out of bed  Taken 07/11/2020 2200 by Betsey Holiday, RN  Safety Interventions:   aspiration precautions   bed alarm   bleeding precautions   fall reduction program maintained   low bed   nonskid shoes/slippers when out of bed  Taken 07/11/2020 2000 by Betsey Holiday, RN  Safety Interventions:   aspiration precautions   bed alarm   bleeding precautions   fall reduction program maintained   low bed   nonskid shoes/slippers when out of bed  Intervention: Prevent Skin Injury  Recent Flowsheet Documentation  Taken 07/12/2020 0400 by Betsey Holiday, RN  Skin Protection: tubing/devices free from skin contact  Taken 07/12/2020 0200 by Betsey Holiday, RN  Skin Protection: tubing/devices free from skin contact  Taken 07/12/2020 0000 by Betsey Holiday, RN  Skin Protection: tubing/devices free from skin contact  Taken 07/11/2020 2200 by Betsey Holiday, RN  Skin Protection: tubing/devices free from skin contact  Taken 07/11/2020 2000 by Betsey Holiday, RN  Skin Protection: tubing/devices free from skin contact  Intervention: Prevent and Manage VTE (Venous Thromboembolism) Risk  Recent Flowsheet Documentation  Taken 07/12/2020 0400 by Betsey Holiday, RN  Activity Management: bedrest  Taken 07/12/2020 0200 by Betsey Holiday, RN  Activity Management: bedrest  Taken 07/12/2020 0000 by Betsey Holiday, RN  Activity Management: bedrest  Taken 07/11/2020 2200 by Betsey Holiday, RN  Activity Management: bedrest  Taken 07/11/2020 2000 by Betsey Holiday, RN  Activity Management: bedrest  Intervention: Prevent Infection  Recent Flowsheet Documentation  Taken 07/12/2020 0400 by Betsey Holiday, RN  Infection Prevention: hand hygiene promoted  Taken 07/12/2020 0200 by Betsey Holiday, RN  Infection Prevention:   hand hygiene promoted   personal protective equipment utilized  Taken 07/12/2020 0000 by Betsey Holiday, RN  Infection Prevention:   hand hygiene promoted   personal protective equipment utilized  Taken 07/11/2020 2200 by Betsey Holiday, RN  Infection Prevention:   hand hygiene promoted   personal protective equipment utilized  Taken 07/11/2020 2000 by Betsey Holiday, RN  Infection Prevention:   hand hygiene promoted   personal protective equipment utilized  Goal: Optimal Comfort and Wellbeing  Outcome: Not Progressing  Goal: Readiness for Transition of Care  Outcome: Not Progressing  Goal: Rounds/Family Conference  Outcome: Not Progressing     Problem: Infection  Goal: Absence of Infection Signs and Symptoms  Outcome: Not Progressing     Problem: Skin Injury Risk Increased  Goal: Skin Health and Integrity  Outcome: Not Progressing  Intervention: Optimize Skin Protection  Recent Flowsheet Documentation  Taken 07/12/2020 0400 by Betsey Holiday, RN  Pressure Reduction Techniques: frequent weight shift encouraged  Head of Bed (HOB) Positioning: HOB at 30-45 degrees  Pressure Reduction Devices: pressure-redistributing mattress utilized  Skin Protection: tubing/devices free from skin contact  Taken 07/12/2020 0200 by Betsey Holiday, RN  Pressure Reduction Techniques: frequent weight shift encouraged  Head of Bed (HOB) Positioning: HOB at 30-45 degrees  Pressure Reduction Devices: pressure-redistributing mattress utilized  Skin Protection: tubing/devices free from skin contact  Taken 07/12/2020 0000 by Betsey Holiday, RN  Pressure Reduction Techniques: frequent weight shift encouraged  Head of Bed (HOB) Positioning: HOB at 30-45 degrees  Pressure Reduction Devices: pressure-redistributing mattress utilized  Skin Protection: tubing/devices free from skin contact  Taken 07/11/2020 2200 by Betsey Holiday, RN  Pressure Reduction Techniques: frequent weight shift encouraged  Head of Bed (HOB) Positioning: HOB at 30-45 degrees  Pressure Reduction Devices: pressure-redistributing mattress utilized  Skin Protection: tubing/devices free from skin contact  Taken 07/11/2020 2000 by Betsey Holiday, RN  Pressure Reduction Techniques: frequent weight shift encouraged  Head of Bed (HOB) Positioning: HOB at 30-45 degrees  Pressure Reduction Devices: pressure-redistributing mattress utilized  Skin Protection: tubing/devices free from skin contact     Problem: Fall Injury Risk  Goal: Absence of Fall and Fall-Related Injury  Outcome: Not Progressing  Intervention: Promote Injury-Free Environment  Recent Flowsheet Documentation  Taken 07/12/2020 0400 by Betsey Holiday, RN  Safety Interventions:   aspiration precautions   bed alarm   bleeding precautions   fall reduction program maintained   low bed   nonskid shoes/slippers when out of bed  Taken 07/12/2020 0200 by Betsey Holiday, RN  Safety Interventions:   aspiration precautions   bed alarm   bleeding precautions   fall reduction program maintained   low bed   nonskid shoes/slippers when out of bed  Taken 07/12/2020 0000 by Betsey Holiday, RN  Safety Interventions:   aspiration precautions   bed alarm   bleeding precautions   fall reduction program maintained   low bed   nonskid shoes/slippers when out of bed  Taken 07/11/2020 2200 by Betsey Holiday, RN  Safety Interventions:   aspiration precautions   bed alarm   bleeding precautions   fall reduction program maintained   low bed   nonskid shoes/slippers when out of bed  Taken 07/11/2020 2000 by Betsey Holiday,  RN  Safety Interventions:   aspiration precautions   bed alarm   bleeding precautions   fall reduction program maintained   low bed   nonskid shoes/slippers when out of bed     Problem: Self-Care Deficit  Goal: Improved Ability to Complete Activities of Daily Living  Outcome: Not Progressing     Problem: Behavioral Health Comorbidity  Goal: Maintenance of Behavioral Health Symptom Control  Outcome: Not Progressing     Problem: Heart Failure Comorbidity  Goal: Maintenance of Heart Failure Symptom Control  Outcome: Not Progressing     Problem: Hypertension Comorbidity  Goal: Blood Pressure in Desired Range  Outcome: Not Progressing

## 2020-07-12 NOTE — Unmapped (Signed)
The patient's respiratory status this shift has been unstable due to . Heart rate has periods  >180's    The patient is currently on nasal cannula.    Lung sounds are Rhonchi   .    The patients cough has been non-productive.    Additional interventions will occur as needed. Vest wrap on hold today.     Tachypneic. Satting well on RA.Placed on 2 lpm nasal cannula .Rhonchi present, coughing up sputum.

## 2020-07-13 LAB — PHOSPHORUS: PHOSPHORUS: 3.2 mg/dL (ref 2.4–5.1)

## 2020-07-13 LAB — COMPREHENSIVE METABOLIC PANEL
ALBUMIN: 2.3 g/dL — ABNORMAL LOW (ref 3.4–5.0)
ALKALINE PHOSPHATASE: 44 U/L — ABNORMAL LOW (ref 46–116)
ALT (SGPT): 11 U/L (ref 10–49)
ANION GAP: 6 mmol/L (ref 5–14)
AST (SGOT): 27 U/L (ref <=34)
BILIRUBIN TOTAL: 0.2 mg/dL — ABNORMAL LOW (ref 0.3–1.2)
BLOOD UREA NITROGEN: 8 mg/dL — ABNORMAL LOW (ref 9–23)
BUN / CREAT RATIO: 12
CALCIUM: 7.5 mg/dL — ABNORMAL LOW (ref 8.7–10.4)
CHLORIDE: 100 mmol/L (ref 98–107)
CO2: 28.8 mmol/L (ref 20.0–31.0)
CREATININE: 0.65 mg/dL
EGFR CKD-EPI (2021) MALE: >90 mL/min/{1.73_m2} (ref >=60)
GLUCOSE RANDOM: 103 mg/dL (ref 70–179)
POTASSIUM: 3.2 mmol/L — ABNORMAL LOW (ref 3.4–4.8)
PROTEIN TOTAL: 6.2 g/dL (ref 5.7–8.2)
SODIUM: 135 mmol/L (ref 135–145)

## 2020-07-13 LAB — CBC
HEMATOCRIT: 34 % — ABNORMAL LOW (ref 39.0–48.0)
HEMOGLOBIN: 11.8 g/dL — ABNORMAL LOW (ref 12.9–16.5)
MEAN CORPUSCULAR HEMOGLOBIN CONC: 34.8 g/dL (ref 32.0–36.0)
MEAN CORPUSCULAR HEMOGLOBIN: 36.8 pg — ABNORMAL HIGH (ref 25.9–32.4)
MEAN CORPUSCULAR VOLUME: 105.6 fL — ABNORMAL HIGH (ref 77.6–95.7)
MEAN PLATELET VOLUME: 7.9 fL (ref 6.8–10.7)
PLATELET COUNT: 78 10*9/L — ABNORMAL LOW (ref 150–450)
RED BLOOD CELL COUNT: 3.22 10*12/L — ABNORMAL LOW (ref 4.26–5.60)
RED CELL DISTRIBUTION WIDTH: 15.1 % (ref 12.2–15.2)
WBC ADJUSTED: 4.2 10*9/L (ref 3.6–11.2)

## 2020-07-13 LAB — MAGNESIUM: MAGNESIUM: 1.6 mg/dL (ref 1.6–2.6)

## 2020-07-13 MED ADMIN — gabapentin (NEURONTIN) capsule 200 mg: 200 mg | ORAL | @ 12:00:00

## 2020-07-13 MED ADMIN — sodium chloride tablet 1 g: 1 g | ORAL | @ 16:00:00

## 2020-07-13 MED ADMIN — loperamide (IMODIUM) capsule 2 mg: 2 mg | ORAL

## 2020-07-13 MED ADMIN — LORazepam (ATIVAN) tablet 2 mg: 2 mg | ORAL | @ 15:00:00

## 2020-07-13 MED ADMIN — magnesium sulfate 2gm/50mL IVPB: 2 g | INTRAVENOUS | @ 14:00:00 | Stop: 2020-07-13

## 2020-07-13 MED ADMIN — loperamide (IMODIUM) capsule 2 mg: 2 mg | ORAL | @ 02:00:00

## 2020-07-13 MED ADMIN — loperamide (IMODIUM) capsule 2 mg: 2 mg | ORAL | @ 07:00:00

## 2020-07-13 MED ADMIN — LORazepam (ATIVAN) tablet 2 mg: 2 mg | ORAL | @ 19:00:00

## 2020-07-13 MED ADMIN — melatonin tablet 3 mg: 3 mg | ORAL | @ 02:00:00

## 2020-07-13 MED ADMIN — ampicillin-sulbactam (UNASYN) 3 g in sodium chloride 0.9 % (NS) 100 mL IVPB connector bag: 3 g | INTRAVENOUS | @ 06:00:00 | Stop: 2020-07-13

## 2020-07-13 MED ADMIN — enoxaparin (LOVENOX) syringe 40 mg: 40 mg | SUBCUTANEOUS | @ 12:00:00

## 2020-07-13 MED ADMIN — gabapentin (NEURONTIN) capsule 200 mg: 200 mg | ORAL | @ 02:00:00

## 2020-07-13 MED ADMIN — ampicillin-sulbactam (UNASYN) 3 g in sodium chloride 0.9 % (NS) 100 mL IVPB connector bag: 3 g | INTRAVENOUS | @ 12:00:00 | Stop: 2020-07-13

## 2020-07-13 MED ADMIN — dilTIAZem (CARDIZEM) 125 mg in sodium chloride (NS) 0.9 % 125 mL infusion: 10 mg/h | INTRAVENOUS | @ 07:00:00 | Stop: 2020-07-13

## 2020-07-13 MED ADMIN — LORazepam (ATIVAN) injection 2 mg: 2 mg | INTRAVENOUS | @ 02:00:00 | Stop: 2020-07-12

## 2020-07-13 MED ADMIN — ampicillin-sulbactam (UNASYN) 3 g in sodium chloride 0.9 % (NS) 100 mL IVPB connector bag: 3 g | INTRAVENOUS | Stop: 2020-07-16

## 2020-07-13 MED ADMIN — nicotine (NICODERM CQ) 21 mg/24 hr patch 1 patch: 1 | TRANSDERMAL | @ 13:00:00

## 2020-07-13 MED ADMIN — furosemide (LASIX) tablet 20 mg: 20 mg | ORAL | @ 12:00:00 | Stop: 2020-07-13

## 2020-07-13 MED ADMIN — magnesium sulfate 2gm/50mL IVPB: 2 g | INTRAVENOUS | @ 12:00:00 | Stop: 2020-07-13

## 2020-07-13 MED ADMIN — ampicillin-sulbactam (UNASYN) 3 g in sodium chloride 0.9 % (NS) 100 mL IVPB connector bag: 3 g | INTRAVENOUS | @ 18:00:00 | Stop: 2020-07-13

## 2020-07-13 MED ADMIN — melatonin tablet 3 mg: 3 mg | ORAL

## 2020-07-13 MED ADMIN — sodium chloride tablet 1 g: 1 g | ORAL | @ 13:00:00

## 2020-07-13 MED ADMIN — ampicillin-sulbactam (UNASYN) 3 g in sodium chloride 0.9 % (NS) 100 mL IVPB connector bag: 3 g | INTRAVENOUS | Stop: 2020-07-13

## 2020-07-13 MED ADMIN — potassium chloride (KLOR-CON) packet 40 mEq: 40 meq | ORAL | @ 13:00:00 | Stop: 2020-07-13

## 2020-07-13 MED ADMIN — sodium chloride tablet 1 g: 1 g | ORAL | @ 21:00:00

## 2020-07-13 MED ADMIN — LORazepam (ATIVAN) tablet 2 mg: 2 mg | ORAL | @ 23:00:00

## 2020-07-13 MED ADMIN — potassium chloride (KLOR-CON) packet 40 mEq: 40 meq | ORAL | @ 19:00:00 | Stop: 2020-07-13

## 2020-07-13 MED ADMIN — dilTIAZem (CARDIZEM CD) 24 hr capsule 360 mg: 360 mg | ORAL | @ 13:00:00

## 2020-07-13 MED ADMIN — amoxicillin-clavulanate (AUGMENTIN) 875-125 mg per tablet 1 tablet: 875 mg | ORAL | @ 15:00:00 | Stop: 2020-07-17

## 2020-07-13 MED ADMIN — folic acid (FOLVITE) tablet 1 mg: 1 mg | ORAL | @ 12:00:00

## 2020-07-13 MED ADMIN — thiamine (B-1) injection 200 mg: 200 mg | INTRAVENOUS | @ 14:00:00

## 2020-07-13 MED ADMIN — OLANZapine (ZyPREXA) tablet 20 mg: 20 mg | ORAL | @ 02:00:00

## 2020-07-13 MED ADMIN — LORazepam (ATIVAN) tablet 2 mg: 2 mg | ORAL | @ 11:00:00

## 2020-07-13 MED ADMIN — LORazepam (ATIVAN) tablet 2 mg: 2 mg | ORAL | @ 03:00:00

## 2020-07-13 MED ADMIN — LORazepam (ATIVAN) tablet 2 mg: 2 mg | ORAL | @ 07:00:00

## 2020-07-13 MED ADMIN — divalproex ER (DEPAKOTE ER) extended release 24 hr tablet 750 mg: 750 mg | ORAL | @ 02:00:00

## 2020-07-13 NOTE — Unmapped (Signed)
PHYSICAL THERAPY  Evaluation (07/13/20 0905)          Patient Name:?? Nicholas Rush????????   Medical Record Number: [id number]   Date of Birth: April 04, 1965  Sex: Male??  ??    Treatment Diagnosis: Impaired funcitonal mobility     Activity Tolerance: Limited by Mental Status     ASSESSMENT  Problem List: Decreased strength, Impaired balance, Decreased mobility, Fall Risk, Impaired ADLs, Gait deviation, Postural Weakness, Decreased coordination, Decreased endurance, Decreased cognition, Decreased safety awareness, Impaired judgement, Cognitive/Behavioral impairments      Assessment : 55 y.o. male with a past medical history significant for HTN, bipolar I disorder, alcohol use disorder c/b withdrawal seizures, recurrent C diff infection who presents s/p witnessed grand Mal seizure for monitored alcohol withdrawal and is found to have new onset LEE.  Pt presents w/ impaired funcitonla mobility; able to stand in [place] stedy w/ min A x 2, transfers sup <> sit mod A x 1-2, poor sitting balance and confusion noted.  Pt will benefit from acute care PT to address pts deficits and goals.      Today's Interventions: PT Eval.  Pt performed bed mobility, transfers, balance work and standing ability in [place] st[place].  Pt education for safety, precautions and progressive mobility.     Personal Factors/Comorbidities Present: 2       Examination of Body System: 1-3 elements       Clinical Decision Making: Low      PLAN  Planned Frequency of Treatment:?? 1x per day for: 2-3x week       Planned Interventions: Airway Clearance, Diaphragmatic / Pursed-lip breathing, Balance activities, Education - Patient, Education - Family / caregiver, Home exercise program, Gait training, Functional mobility, Endurance activities, Therapeutic exercise, Therapeutic activity, Transfer training, Stair training, Positioning, Self-care / Home training, Postural re-education     Post-Discharge Physical Therapy Recommendations:?? 5x weekly, Low intensity     PT DME Recommendations:  (TBD)??????????       Goals:   Patient and Family Goals: Not able to state, will assess ASAP.     Long Term Goal #1: Pt will amb w/ RW mod I household distances 6 weeks from today (07/13/20)        SHORT GOAL #1: Pt will transfers sup <> sit min A.  ?????????????????????? Time Frame : 2 weeks  SHORT GOAL #2: Pt will transfer sit <> stand to RW CGA.  ?????????????????????? Time Frame : 2 weeks  SHORT GOAL #3: Pt will amb w/ 25' min A.  ?????????????????????? Time Frame : 2 weeks     ??????????????????????       ??????????????????????       Prognosis:?? Fair     Barriers to Discharge: Endurance deficits, Impaired Balance, Decreased safety awareness, Decreased caregiver support, Severity of deficits, Functional strength deficits, Gait instability, Impulsivity, Inability to safely perform ADLS     SUBJECTIVE  Patient reports: Pt agreeable to PT, confusion noted but follwoing simple commands.  Current Functional Status: Pt in bed pre and post PT, all needs met.     Prior Functional Status: Not able to state  Equipment available at home: Levan Hurst, Rollator      Past Medical History:   Diagnosis Date   ??? Alcohol withdrawal syndrome, with delirium (CMS-HCC) 05/11/2012   ??? Anxiety    ??? Bipolar 1 disorder (CMS-HCC)    ??? Depressive disorder    ??? Erectile dysfunction    ??? Esophageal reflux    ??? History of  pyloric stenosis    ??? Hypertension    ??? Insomnia    ??? Psoriasis     Dermatologist - Dr. Deloria Lair in [facility]   ??? Seizure (CMS-HCC) 02/20/2019   ??? Tobacco use     1ppd            Social History     Tobacco Use   ??? Smoking status: Current Every Day Smoker     Packs/day: 1.50     Years: 30.00     Pack years: 45.00     Types: Cigarettes   ??? Smokeless tobacco: Never Used   Substance Use Topics   ??? Alcohol use: Yes     Comment: 3 drinks a day.       Past Surgical History:   Procedure Laterality Date   ??? PYLOROMYOTOMY     ??? WISDOM TOOTH EXTRACTION               Family History   Problem Relation Age of Onset   ??? Bipolar disorder Mother    ??? Depression Sister    ??? Alcohol abuse Sister    ??? Alcohol abuse Maternal Aunt    ??? Melanoma Neg Hx    ??? Basal cell carcinoma Neg Hx    ??? Squamous cell carcinoma Neg Hx         Allergies: Tramadol and Hydroxyzine                  Objective Findings  Precautions / Restrictions  Precautions: Falls precautions, Seizure precautions  Weight Bearing Status: Non-applicable  Required Braces or Orthoses: Non-applicable     Communication Preference: Verbal          Pain Comments: No c/o pain.  Medical Tests / Procedures: Reviewed H&P, orders.  Equipment / Environment: Patient not wearing mask for full session, Vascular access (PIV, TLC, Port-a-cath, PICC), Foley, Telemetry (PT wearing mask t/o)     At Rest: VSS per monitor  With Activity: NAD  Orthostatics: No s/sx  Airway Clearance: Mobility     Living Situation  Living Environment: Mobile home  Lives With: Spouse  Home Living: One level home, Ramped entrance, Tub/shower unit, Hand-held shower hose, Standard height toilet, Raised toilet seat with rails, Tub bench      Cognition comment: Confsuion noted, following simple commands.  Alert to name, year month, place partially correct.  Visual/Perception: Within Functional Limits     Skin Inspection: Intact where visualized     Upper Extremities  UE ROM: Right WFL, Left WFL  UE Strength: Left WFL, Right WFL    Lower Extremities  LE ROM: Left WFL, Right WFL  LE Strength: Right WFL, Left WFL     Coordination: Impaired  Balance comment: SItting CGA to Min A, fatiques quickly.  Posture comment: flexed      Bed Mobility: Sup to sit mod A w/ HOB at 50 degrees, sit to sup mod A x 2.     Transfer comments: Sit <> stand in [place] stedy min A x 2, from an elevated bed.      Gait: Pt able to stand x 2 for 20 seconds w/ min A x 2.  Not able to progress to amb.                  Endurance: poor     Physical Therapy Session Duration  PT Individual [mins]: 20  PT Co-Treatment [mins]: 40  Reason for Co-treatment: Requires heavy assist for safety, To safely  progress mobility, Poor activity tolerance     Medical Staff Made Aware: RN     I attest that I have reviewed the above information.  Signed: Shannan Harper, PT  Filed 07/13/2020

## 2020-07-13 NOTE — Unmapped (Signed)
Problem: Adult Inpatient Plan of Care  Goal: Plan of Care Review  Outcome: Ongoing - Unchanged   Patient received on nasal cannula and airway clearance done as ordered.

## 2020-07-13 NOTE — Unmapped (Signed)
Will continue to monitor patient.

## 2020-07-13 NOTE — Unmapped (Addendum)
Adult Nutrition Assessment Note    Visit Type: RN Consult  Reason for Visit: Per Admission Nutrition Screen (Adult), Have you gained or lost 10 pounds in the past 3 months?, Have you had a decrease in food intake or appetite?      HPI & PMH:  Per MD note, patient is a 55 y.o. male with a past medical history significant for HTN, bipolar I??disorder, alcohol use??disorder c/b withdrawal seizures,??recurrent??C??diff??infection??who presents s/p witnessed grand Mal seizure for monitored alcohol withdrawal and is found to have new onset LEE.     Anthropometric Data:  Height: 185.4 cm (6' 1)   Admission weight: 98.4 kg (217 lb)  Last recorded weight: 91 kg (200 lb 9.6 oz)  IBW: 83.51 kg  Percent IBW: 117.87 %  BMI: Body mass index is 26.47 kg/m??.   Usual Body Weight: Unable to obtain at this time - per RD note from previous admission patient stated 200#    Weight history prior to admission: no recent loss noted   Wt Readings from Last 10 Encounters:   07/10/20 91 kg (200 lb 9.6 oz)   05/20/20 89.6 kg (197 lb 8.5 oz)   03/25/20 89.6 kg (197 lb 8 oz)   03/19/20 89.4 kg (197 lb 1.5 oz)   03/09/20 90.4 kg (199 lb 3 oz)   02/23/20 83.9 kg (184 lb 15.5 oz)   01/17/20 83.9 kg (185 lb)   01/15/20 86.6 kg (191 lb)   12/13/19 84.7 kg (186 lb 11.2 oz)   12/11/19 85 kg (187 lb 6.4 oz)        Weight changes this admission:   Last 5 Recorded Weights    07/08/20 0610 07/10/20 1100   Weight: 98.4 kg (217 lb) 91 kg (200 lb 9.6 oz)        Nutrition Focused Physical Exam:  Unable to complete at this time due to patient's clinical condition       NUTRITIONALLY RELEVANT DATA     Medications:   Nutritionally pertinent medications reviewed and evaluated for potential food and/or medication interactions.    Folic acid, Lasix, Miralax, KCl 40 meq, NaCl 1g, thiamine    Labs:   Nutritionally pertinent labs reviewed and include K+: 3.2 mmol/L    Nutrition History:   July 13, 2020: Prior to admission: RD attempted to speak with patient today.  RN present.  Patient unable to be roused with significant secretions noted.  RN reports patient likes orange juice and ate ~25% of breakfast.    Allergies, Intolerances, Sensitivities, and/or Cultural/Religious Dietary Restrictions: none identified at this time     Current Nutrition:  Oral intake        Nutrition Orders   (From admission, onward)            None             Nutritional Needs:   Daily Estimated Nutrient Needs:  Energy: 1610-9604 kcals Per [place]-Jeor Equation (AF: 1.1-1.3) using admission body weight, 91 kg (07/13/20 1856)]  Protein: 99-117 gm [ (20% of kcals) using admission body weight, 91 kg (07/13/20 1856)]  Carbohydrate:   [no restriction]  Fluid: 5409-8119 mL [1 mL/kcal (maintenance)]      Malnutrition assessment not yet completed at this time due lack of nutrition history and inability to complete nutrition focused physical exam (NFPE).     GOALS and EVALUATION     ??? Patient to meet 75% or greater of nutritional needs via combination of meals, snacks, and/or oral  supplements within 3-5.  - New    Motivation, Barriers, and Compliance:  Evaluation of motivation, barriers, and compliance pending at this time due to clinical status.     NUTRITION ASSESSMENT     ??? Current  nutrition therapy is appropriate although not meeting nutritional  needs at this time due to limited po intake.   ??? Patient would benefit from nutrition supplements to help increase po intake.  ??? If patient is unable to meet estimated needs with intake at meals, enteral nutrition may be considered.  ??? Patient a possible refeeding risk due to suspected poor po intake due to alcohol abuse.        Discharge Planning:   Monitor via CAPP rounds for any discharge planning needs.      NUTRITION INTERVENTIONS and RECOMMENDATION     1. Continue current diet as ordered: Soft & Bite Sized (Level 6) with Thin Liquids (Level 0)   - advance per SLP recommendations  2. Recommend Ensure Enlive BID  3. Daily labs including Mg and Phos due to possible refeeding risk  4. If patient is unable to meet estimated needs with intake at meals, recommend to start enteral nutrition with Osmolite 1.5 Cal at goal rate 56mL/hr. This provides 2205 kcals, 93g protein, 300g carbohydrate, 73g fat, and free water.   - initiate at 62mL/hr increasing 10mL q4hr to a goal of 57mL/hr   - FWF per MD team, recommend q4hr  5. Monitor po intake and record % meals in Epic  6. Weights 2x/wk    Follow-Up Parameters:   1-2 times per week (and more frequent as indicated)    I appreciate the opportunity to participate in the care of this patient.  Please contact me with any questions.    Terrence Dupont, MS, RD, LDN  Pager: [phone]

## 2020-07-13 NOTE — Unmapped (Cosign Needed)
Family Medicine Inpatient Service    Progress Note    Team: [facility] (pgr [phone])    Hospital Day: 5    ASSESSMENT / PLAN:   Nicholas Rush is a 55 y.o. male with a past medical history significant for HTN, bipolar I??disorder, alcohol use??disorder c/b withdrawal seizures,??recurrent??C??diff??infection who presents s/p witnessed grand Mal seizure for monitored alcohol withdrawal and is found to have new onset LEE.     ## Alcohol Use Disorder - hx complicated W/d  Last EtOH > 96 hours ago. Overall much improved; He is afebrile and grossly oriented, albeit remains confused and unable to manage eating on his own. Now able to take PO; on Ativan 2 mg PO q4h, plan to titrate.   - Standing Ativan 2 mg q4h + prn assessment and Ativan prn per mild withdrawal  - thiamine and folate daily  - zofran PRN  - melatonin at bedtime  - home gabapentin dose 200mg  BID  - CM to provide resources. Will consider naltrexone.    ## Fever  Patient's presentation less concerning for infection with no fever, no sign of consolidation on lung exam. Will transition to Augmentin and finish 5 day course of treatment.    - Augmentin 875 mg BID -6/28    ## Paroxysmal Afib  Improved rate in 110s today; remains in Afib. Transitioned back to home oral diltiazem from drip. K 3.2 and Mg 1.6 today.  - diltiazem 360 mg daily  - 40 mEq K for two doses and Mg 4g IV today??    ## Myotonic Clonic Seizure   Most likely provoked seizure 2/2 alcohol or infection. Spot EEG during recent hospitalization not suggestive of underlying pathology. Neurology suggests most consistent with provoked seizure and recommends titration of depakote to therapeutic level.     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 Patient had psych admission where they attempted to switch to lithium, but patient did not tolerate. He has not been stable on antipsychotic monotherapy and has had reasonable stability with subtherapeutic dose of VPA.   - 1 g salt tab TID  - daily CMP  ??  ## Lower Extremity Edema, resolved  New onset edema without other HF symptoms. Differential includes new onset HF, renal dysfunction (primarily proteinuria), and portal hypertension. Bedside ECHO unremarkable. UPC unremarkable. RUQ [place] mostly unremarkable, but liver dysfunction could be contributory factor to hypoalbuminemia. Improved with Lasix  ??  ##??Diarrhea:??  C diff negative. Patient with loose stool overnight after Miralax and senna. Had not had BM for 2 days prior. F/u GIPP. Bowel regimen switched to miralax prn.       ## Bipolar I Disorder: well-controlled on Depakote and Zyprexa. VPA subtherapeutic, but will not increase due to concern for SIADH and decreasing sodium level.   - continue zyprexa 20mg  at bedtime    ??  ## Tobacco use disorder: Patient currently smoking 1PPD. Will place inpatient consult to tobacco cessation for toxic effects of tobacco  - Tobacco cessation consult  - Nicotine replacement therapy with patch, gum, lozenge PRN  ??    # Checklist:  - IVF None  - Tubes/Lines/Drains: PIV, ext catheter  - Diet: Holding for sedation; pending speech  - Bowel Regimen: Miralax prn  - DVT: SQ Lovenox  - Code Status:   Orders Placed This Encounter   Procedures   ??? Full Code     Standing Status:   Standing     Number  of Occurrences:   1     - Dispo: ICU    [ ]  Anticipated Discharge Location: Home  [ ]  PT/OT/DME: PT/OT ordered  [ ]  CM/SW needs: Likely pending PT/OT recs, Substance abuse resources  [ ]  Meds/Rx:  Not yet prescribed. No special med needs  [ ]  Teaching: None anticipated  [ ]  Follow up appt: Appointment needed  [ ]  Excuse letter: None anticipated  [ ]  Transport: Private Needed    SUBJECTIVE:  Interval events: Patient is more alert and is oriented x3. He is later found with breakfast spilled all over himself and the bed.       REVIEW OF SYSTEMS:  Pertinent positives and negatives per HPI. A complete review of systems otherwise negative.    PHYSICAL EXAM:      Intake/Output Summary (Last 24 hours) at 07/13/2020 0954  Last data filed at 07/13/2020 0645  Gross per 24 hour   Intake 2206.42 ml   Output 1575 ml   Net 631.42 ml       Recent Vitals:  Vitals:    07/13/20 0847   BP: 121/83   Pulse:    Resp:    Temp:    SpO2:        GEN: Sitting in bed, drowsy, but easily arousable and does not fall back asleep quickly.       HEENT: NCAT, No scleral icterus. Conjunctiva non-erythematous. MMM.  CV: Tachycardic. No murmurs/rubs/gallops.  Pulm: Tachypneic. Satting well on 2L [place]. CTAB. Rhonchi present, coughing up sputum.   Abd: Flat.  Nontender. No guarding, rebound.  Normoactive bowel sounds.    Neuro: A&O x3. No focal deficits.  Ext: No peripheral edema. Palpable distal pulses.  Skin: No rashes or skin lesions.       LABS/ STUDIES:    All imaging, laboratory studies, and other pertinent tests including electrocardiography within the last 24 hours were reviewed and are summarized within the assessment and plan.     NUTRITION:                           Jolyn Nap, MD PGY1  July 09, 2020 10:48 AM

## 2020-07-13 NOTE — Unmapped (Signed)
Patient is alert  and oriented x 4, able to order his breakfast diet this am.  Unable to eat self with out supervision. Needs assistance in eating his food.    OT assisted patient to stand with [place] Dec plus   and he was able to stand for few seconds. Need more assistance in  ambulation. Pt is able to swallow tablets. Will continue to monitor pt.

## 2020-07-13 NOTE — Unmapped (Signed)
Speech Language Pathology Clinical Swallow Assessment  Evaluation (07/13/20 1415)    Patient Name:  Nicholas Rush       Medical Record Number: [id number]   Date of Birth: [birth year]  Sex: Male            SLP Treatment Diagnosis: Oropharyngeal dysphagia  Activity Tolerance: Patient tolerated treatment well    Assessment  Nicholas Rush is a 55 y.o. male who presents with suspected mild oropharyngeal dysphagia. Clinical swallow evaluation was remarkable for low endurance for functional task of eating and generalized oromotor weakness.  During po trials, pt with positive hyolaryngeal movement judged via observation and palpation. Pt with limited sign/symptoms of aspiration; single throat clear observed with consecutive straw sips of thin liquid. Overall swallow function is negatively impacted by impulsivity and reduced endurance. MBSS not indicated at this time; SLP will monitor for need.  Pt is at mild-moderate risk for aspiration. Due to identified oral deficits and impulsivity with intake; recommend Level 6 (soft solids) and Level 0 (thin liquids) with strict aspiration precautions (slow rate, small bites, elevated HOB, and alert for PO intake). SLP will follow up for further dysaphia intervention.  Risk for Aspiration: Mild     Recommendations:         Diet Liquids Recommendations: Thin Liquids, Level 0    Diet Solids Recommendation: Soft & Bite-sized, Level 6    Recommended Form of Medications: Whole, With liquid, With puree      Compensatory Swallowing Strategies: Upright as possible for all oral intake, Eat/feed slowly, Small bites/sips    Post Acute Discharge Recommendations  Post Acute SLP Discharge Recommendations: To be determined    Prognosis: Good  Positive Indicators: reported increased alertness  Barriers to Discharge: Endurance deficits, Decreased safety awareness, Severity of deficits, Functional strength deficits, Impulsivity, Inability to safely perform ADLS, Decreased caregiver support Plan of Care  SLP Follow-up / Frequency: 1x per day, 1-3x week      Treatment Goals:  Short Term Goal 1: Pt will complete trials of Level 6, Level 0 diet with minimal overt s/sx of aspiration and compliance with aspiration precautions.   Time Frame : 1 week   Short Term Goal 2: Pt will complete trials of advanced solids with complete, timely mastication and clearance of oral cavity to determine readiness for diet advancement.   Time Frame : 1 week                 LTG 1: Pt will consume least restrictive diet without associated pulmonary decline       Planned Interventions: Dysphagia Intervention        Patient and Family Goal: to eat    Subjective  Current Functional Status: Nicholas Rush is a 55 y.o. male with a past medical history significant for HTN, bipolar I disorder, alcohol use disorder c/b withdrawal seizures, recurrent C diff infection who presents s/p witnessed grand Mal seizure for monitored alcohol withdrawal and is found to have new onset LEE. Pt currently NPO and 2L nasal canula  Prior Function: Independent prior to admission   Lives With: Spouse (pt reports living with spouse)              Communication Preference: Verbal  Patient/Caregiver Reports: Pt reports desire to see menu for food options     Allergies: Tramadol and Hydroxyzine  Current Facility-Administered Medications   Medication Dose Route Frequency Provider Last Rate Last Admin   ??? amoxicillin-clavulanate (AUGMENTIN) 875-125 mg per  tablet 1 tablet  875 mg Oral BID Jolyn Nap, MD   1 tablet at 07/13/20 1040   ??? ampicillin-sulbactam (UNASYN) 3 g in sodium chloride 0.9 % (NS) 100 mL IVPB connector bag  3 g Intravenous Q6H Hedy Camara, MD   Stopped at 07/13/20 1439   ??? dilTIAZem (CARDIZEM CD) 24 hr capsule 360 mg  360 mg Oral Daily Jolyn Nap, MD   360 mg at 07/13/20 0847   ??? divalproex ER (DEPAKOTE ER) extended release 24 hr tablet 750 mg  750 mg Oral Nightly Jolyn Nap, MD   750 mg at 07/12/20 2135   ??? enoxaparin (LOVENOX) syringe 40 mg  40 mg Subcutaneous Q24H [facility] Harriet Pho, MD   40 mg at 07/13/20 0805   ??? folic acid (FOLVITE) tablet 1 mg  1 mg Oral Daily Bevan P Bonhomme   1 mg at 07/13/20 8295   ??? gabapentin (NEURONTIN) capsule 200 mg  200 mg Oral BID Harriet Pho, MD   200 mg at 07/13/20 6213   ??? loperamide (IMODIUM) capsule 2 mg  2 mg Oral QID PRN Hedy Camara, MD   2 mg at 07/13/20 0307   ??? LORazepam (ATIVAN) tablet 2 mg  2 mg Oral Q4H Beaulah Dinning, MD   2 mg at 07/13/20 1509   ??? LORazepam (ATIVAN) tablet 2 mg  2 mg Oral Q4H PRN Jolyn Nap, MD        Or   ??? LORazepam (ATIVAN) tablet 4 mg  4 mg Oral Q4H PRN Jolyn Nap, MD        Or   ??? LORazepam (ATIVAN) tablet 4 mg  4 mg Oral Q30 Min PRN Jolyn Nap, MD        Or   ??? LORazepam (ATIVAN) tablet 4 mg  4 mg Oral Q2H PRN Jolyn Nap, MD       ??? melatonin tablet 3 mg  3 mg Oral QPM Harriet Pho, MD   3 mg at 07/12/20 2134   ??? nicotine (NICODERM CQ) 21 mg/24 hr patch 1 patch  1 patch Transdermal Daily Harriet Pho, MD   1 patch at 07/13/20 0851   ??? nicotine polacrilex (NICORETTE) lozenge 4 mg  4 mg Buccal Q1H PRN Karle Starch, MD       ??? OLANZapine (ZyPREXA) tablet 20 mg  20 mg Oral Nightly Harriet Pho, MD   20 mg at 07/12/20 2134   ??? ondansetron (ZOFRAN-ODT) disintegrating tablet 4 mg  4 mg Oral Q8H PRN Harriet Pho, MD       ??? polyethylene glycol (MIRALAX) packet 17 g  17 g Oral Daily PRN Jolyn Nap, MD       ??? sodium chloride tablet 1 g  1 g Oral 3xd Meals Harriet Pho, MD   1 g at 07/13/20 1159   ??? thiamine (B-1) injection 200 mg  200 mg Intravenous Daily Axel Filler, MD   200 mg at 07/13/20 0865     Past Medical History:   Diagnosis Date   ??? Alcohol withdrawal syndrome, with delirium (CMS-HCC) 05/11/2012   ??? Anxiety    ??? Bipolar 1 disorder (CMS-HCC)    ??? Depressive disorder    ??? Erectile dysfunction    ??? Esophageal reflux    ??? History of pyloric stenosis    ??? Hypertension    ??? Insomnia    ??? Psoriasis     Dermatologist - Dr. Deloria Lair in  [place]   ??? Seizure (CMS-HCC) 02/20/2019   ??? Tobacco use     1ppd     Family History   Problem Relation Age of Onset   ??? Bipolar disorder Mother    ??? Depression Sister    ??? Alcohol abuse Sister    ??? Alcohol abuse Maternal Aunt    ??? Melanoma Neg Hx    ??? Basal cell carcinoma Neg Hx    ??? Squamous cell carcinoma Neg Hx      Past Surgical History:   Procedure Laterality Date   ??? PYLOROMYOTOMY     ??? WISDOM TOOTH EXTRACTION       Social History     Tobacco Use   ??? Smoking status: Current Every Day Smoker     Packs/day: 1.50     Years: 30.00     Pack years: 45.00     Types: Cigarettes   ??? Smokeless tobacco: Never Used   Substance Use Topics   ??? Alcohol use: Yes     Comment: 3 drinks a day.         General:                    Vision: Functional for self-feeding                   Medical Tests / Procedures Comments: CXR 07/11/20: Increased left basal consolidation, which may reflect airspace disease, including aspiration, versus atelectasis. Trace left pleural effusion.  Equipment/Environment: Caregiver wearing mask for full session, Patient not wearing mask for full session, Supplemental oxygen (SLP wearing surgical mask t/o)  Services patient receives: SLP, PT, OT      Objective  Temperature Spikes Noted: No  Respiratory Status : O2 via nasal cannula  History of Intubation: No          Behavior/Cognition: Alert, Confused  Positioning : Upright in bed    Oral / Motor Exam  Vocal Quality: Normal      Labial ROM: Within Functional Limits   Labial Symmetry: Within Functional Limits  Labial Strength: Reduced      Lingual Symmetry: Within Functional Limits  Lingual Strength: Reduced             Coordination: slighlty prolonged      Facial Symmetry: Within Functional Limits         Vocal Intensity: Mildly decreased       Apraxia: None present   Dysarthria: Minimal Dysarthria   Intelligibility: Intelligible   Breath Support: Adequate for speech   Dentition: Adequate    Consistencies assessed: regular solids, puree, and thin liquids    Medical Staff Made Aware: RN, MD    Speech Therapy Session Duration  SLP Individual [mins]: 15    I attest that I have reviewed the above information.  Signed: [place] C Jarrett, SLP    Ceasar Mons 07/13/2020

## 2020-07-13 NOTE — Unmapped (Signed)
Problem: Adult Inpatient Plan of Care  Goal: Plan of Care Review  Outcome: Ongoing - Unchanged  Goal: Patient-Specific Goal (Individualized)  Outcome: Ongoing - Unchanged  Goal: Absence of Hospital-Acquired Illness or Injury  Outcome: Ongoing - Unchanged  Intervention: Identify and Manage Fall Risk  Recent Flowsheet Documentation  Taken 07/12/2020 0800 by Gorden Harms, RN  Safety Interventions:   aspiration precautions   bed alarm   bleeding precautions   environmental modification   fall reduction program maintained   infection management   lighting adjusted for tasks/safety   low bed   room near unit station   toileting scheduled  Intervention: Prevent Skin Injury  Recent Flowsheet Documentation  Taken 07/12/2020 0800 by Gorden Harms, RN  Skin Protection:   adhesive use limited   cleansing with dimethicone incontinence wipes   incontinence pads utilized   protective footwear used   transparent dressing maintained   tubing/devices free from skin contact  Intervention: Prevent and Manage VTE (Venous Thromboembolism) Risk  Recent Flowsheet Documentation  Taken 07/12/2020 0800 by Gorden Harms, RN  Activity Management: bedrest  VTE Prevention/Management: anticoagulant therapy  Range of Motion: Bilateral Upper and Lower Extremities  Intervention: Prevent Infection  Recent Flowsheet Documentation  Taken 07/12/2020 0800 by Gorden Harms, RN  Infection Prevention:   cohorting utilized   environmental surveillance performed   equipment surfaces disinfected   hand hygiene promoted   personal protective equipment utilized   rest/sleep promoted   single patient room provided  Goal: Optimal Comfort and Wellbeing  Outcome: Ongoing - Unchanged  Goal: Readiness for Transition of Care  Outcome: Ongoing - Unchanged  Goal: Rounds/Family Conference  Outcome: Ongoing - Unchanged     Problem: Infection  Goal: Absence of Infection Signs and Symptoms  Outcome: Ongoing - Unchanged  Intervention: Prevent or Manage Infection  Recent Flowsheet Documentation  Taken 07/12/2020 0800 by Gorden Harms, RN  Infection Management: aseptic technique maintained     Problem: Skin Injury Risk Increased  Goal: Skin Health and Integrity  Outcome: Ongoing - Unchanged  Intervention: Optimize Skin Protection  Recent Flowsheet Documentation  Taken 07/12/2020 1700 by Gorden Harms, RN  Pressure Reduction Techniques:   frequent weight shift encouraged   heels elevated off bed   pressure points protected   weight shift assistance provided  Pressure Reduction Devices:   positioning supports utilized   pressure-redistributing mattress utilized  Taken 07/12/2020 1600 by Gorden Harms, RN  Pressure Reduction Techniques:   frequent weight shift encouraged   heels elevated off bed   pressure points protected   weight shift assistance provided  Pressure Reduction Devices:   positioning supports utilized   pressure-redistributing mattress utilized  Taken 07/12/2020 1500 by Gorden Harms, RN  Pressure Reduction Techniques:   frequent weight shift encouraged   heels elevated off bed   pressure points protected   weight shift assistance provided  Pressure Reduction Devices:   positioning supports utilized   pressure-redistributing mattress utilized  Taken 07/12/2020 1400 by Gorden Harms, RN  Pressure Reduction Techniques:   frequent weight shift encouraged   heels elevated off bed   pressure points protected   weight shift assistance provided  Pressure Reduction Devices:   positioning supports utilized   pressure-redistributing mattress utilized  Taken 07/12/2020 1300 by Gorden Harms, RN  Pressure Reduction Techniques:   frequent weight shift encouraged   heels elevated off bed   pressure points protected   weight shift assistance provided  Pressure Reduction Devices:   positioning supports utilized  pressure-redistributing mattress utilized  Taken 07/12/2020 1200 by Gorden Harms, RN  Pressure Reduction Techniques:   frequent weight shift encouraged   heels elevated off bed   pressure points protected   weight shift assistance provided  Pressure Reduction Devices:   positioning supports utilized   pressure-redistributing mattress utilized  Taken 07/12/2020 1100 by Gorden Harms, RN  Pressure Reduction Techniques:   frequent weight shift encouraged   heels elevated off bed   pressure points protected   weight shift assistance provided  Taken 07/12/2020 1000 by Gorden Harms, RN  Pressure Reduction Techniques:   frequent weight shift encouraged   heels elevated off bed   pressure points protected   weight shift assistance provided  Pressure Reduction Devices:   positioning supports utilized   pressure-redistributing mattress utilized  Taken 07/12/2020 0800 by Gorden Harms, RN  Pressure Reduction Techniques:   frequent weight shift encouraged   heels elevated off bed   pressure points protected   weight shift assistance provided  Head of Bed (HOB) Positioning: HOB at 30-45 degrees  Pressure Reduction Devices:   positioning supports utilized   pressure-redistributing mattress utilized  Skin Protection:   adhesive use limited   cleansing with dimethicone incontinence wipes   incontinence pads utilized   protective footwear used   transparent dressing maintained   tubing/devices free from skin contact     Problem: Fall Injury Risk  Goal: Absence of Fall and Fall-Related Injury  Outcome: Ongoing - Unchanged  Intervention: Promote Scientist, clinical (histocompatibility and immunogenetics) Documentation  Taken 07/12/2020 0800 by Gorden Harms, RN  Safety Interventions:   aspiration precautions   bed alarm   bleeding precautions   environmental modification   fall reduction program maintained   infection management   lighting adjusted for tasks/safety   low bed   room near unit station   toileting scheduled     Problem: Self-Care Deficit  Goal: Improved Ability to Complete Activities of Daily Living  Outcome: Ongoing - Unchanged     Problem: Behavioral Health Comorbidity  Goal: Maintenance of Behavioral Health Symptom Control  Outcome: Ongoing - Unchanged     Problem: Heart Failure Comorbidity  Goal: Maintenance of Heart Failure Symptom Control  Outcome: Ongoing - Unchanged     Problem: Hypertension Comorbidity  Goal: Blood Pressure in Desired Range  Outcome: Ongoing - Unchanged

## 2020-07-13 NOTE — Unmapped (Signed)
The patient's respiratory status this shift has been improving .     The patient is currently on nasal cannula.    Lung sounds are coarse   .    The patients cough has been non-productive.    Additional interventions will occur as   needed.     Vest wrap tolerated well.

## 2020-07-14 LAB — COMPREHENSIVE METABOLIC PANEL
ALBUMIN: 2.4 g/dL — ABNORMAL LOW (ref 3.4–5.0)
ALKALINE PHOSPHATASE: 55 U/L (ref 46–116)
ALT (SGPT): 13 U/L (ref 10–49)
ANION GAP: 6 mmol/L (ref 5–14)
AST (SGOT): 23 U/L (ref <=34)
BILIRUBIN TOTAL: 0.3 mg/dL (ref 0.3–1.2)
BLOOD UREA NITROGEN: 8 mg/dL — ABNORMAL LOW (ref 9–23)
BUN / CREAT RATIO: 13
CALCIUM: 7.7 mg/dL — ABNORMAL LOW (ref 8.7–10.4)
CHLORIDE: 97 mmol/L — ABNORMAL LOW (ref 98–107)
CO2: 28.6 mmol/L (ref 20.0–31.0)
CREATININE: 0.63 mg/dL
EGFR CKD-EPI (2021) MALE: >90 mL/min/{1.73_m2} (ref >=60)
GLUCOSE RANDOM: 89 mg/dL (ref 70–179)
POTASSIUM: 3.8 mmol/L (ref 3.5–5.1)
PROTEIN TOTAL: 6.5 g/dL (ref 5.7–8.2)
SODIUM: 132 mmol/L — ABNORMAL LOW (ref 135–145)

## 2020-07-14 LAB — CBC
HEMATOCRIT: 36.9 % — ABNORMAL LOW (ref 39.0–48.0)
HEMOGLOBIN: 12.9 g/dL (ref 12.9–16.5)
MEAN CORPUSCULAR HEMOGLOBIN CONC: 34.9 g/dL (ref 32.0–36.0)
MEAN CORPUSCULAR HEMOGLOBIN: 36.2 pg — ABNORMAL HIGH (ref 25.9–32.4)
MEAN CORPUSCULAR VOLUME: 103.9 fL — ABNORMAL HIGH (ref 77.6–95.7)
MEAN PLATELET VOLUME: 8 fL (ref 6.8–10.7)
PLATELET COUNT: 86 10*9/L — ABNORMAL LOW (ref 150–450)
RED BLOOD CELL COUNT: 3.55 10*12/L — ABNORMAL LOW (ref 4.26–5.60)
RED CELL DISTRIBUTION WIDTH: 15.3 % — ABNORMAL HIGH (ref 12.2–15.2)
WBC ADJUSTED: 4.4 10*9/L (ref 3.6–11.2)

## 2020-07-14 LAB — MAGNESIUM: MAGNESIUM: 1.5 mg/dL — ABNORMAL LOW (ref 1.6–2.6)

## 2020-07-14 LAB — PHOSPHORUS: PHOSPHORUS: 3.2 mg/dL (ref 2.4–5.1)

## 2020-07-14 MED ADMIN — sodium chloride tablet 1 g: 1 g | ORAL | @ 13:00:00

## 2020-07-14 MED ADMIN — enoxaparin (LOVENOX) syringe 40 mg: 40 mg | SUBCUTANEOUS | @ 14:00:00

## 2020-07-14 MED ADMIN — dilTIAZem (CARDIZEM CD) 24 hr capsule 360 mg: 360 mg | ORAL | @ 13:00:00

## 2020-07-14 MED ADMIN — LORazepam (ATIVAN) tablet 1 mg: 1 mg | ORAL | @ 20:00:00

## 2020-07-14 MED ADMIN — nicotine (NICODERM CQ) 21 mg/24 hr patch 1 patch: 1 | TRANSDERMAL | @ 13:00:00

## 2020-07-14 MED ADMIN — gabapentin (NEURONTIN) capsule 200 mg: 200 mg | ORAL | @ 13:00:00

## 2020-07-14 MED ADMIN — folic acid (FOLVITE) tablet 1 mg: 1 mg | ORAL | @ 13:00:00

## 2020-07-14 MED ADMIN — magnesium sulfate 2gm/50mL IVPB: 2 g | INTRAVENOUS | @ 14:00:00 | Stop: 2020-07-14

## 2020-07-14 MED ADMIN — magnesium sulfate 2gm/50mL IVPB: 2 g | INTRAVENOUS | @ 15:00:00 | Stop: 2020-07-14

## 2020-07-14 MED ADMIN — amoxicillin-clavulanate (AUGMENTIN) 875-125 mg per tablet 1 tablet: 875 mg | ORAL | @ 13:00:00 | Stop: 2020-07-17

## 2020-07-14 MED ADMIN — LORazepam (ATIVAN) tablet 2 mg: 2 mg | ORAL | @ 07:00:00 | Stop: 2020-07-14

## 2020-07-14 MED ADMIN — sodium chloride tablet 1 g: 1 g | ORAL | @ 17:00:00

## 2020-07-14 MED ADMIN — OLANZapine (ZyPREXA) tablet 20 mg: 20 mg | ORAL | @ 01:00:00

## 2020-07-14 MED ADMIN — LORazepam (ATIVAN) tablet 1 mg: 1 mg | ORAL | @ 15:00:00 | Stop: 2020-07-14

## 2020-07-14 MED ADMIN — thiamine (B-1) injection 200 mg: 200 mg | INTRAVENOUS | @ 14:00:00 | Stop: 2020-07-14

## 2020-07-14 MED ADMIN — divalproex ER (DEPAKOTE ER) extended release 24 hr tablet 750 mg: 750 mg | ORAL | @ 01:00:00

## 2020-07-14 MED ADMIN — loperamide (IMODIUM) capsule 2 mg: 2 mg | ORAL | @ 07:00:00

## 2020-07-14 MED ADMIN — amoxicillin-clavulanate (AUGMENTIN) 875-125 mg per tablet 1 tablet: 875 mg | ORAL | @ 01:00:00 | Stop: 2020-07-17

## 2020-07-14 MED ADMIN — gabapentin (NEURONTIN) capsule 200 mg: 200 mg | ORAL | @ 01:00:00

## 2020-07-14 MED ADMIN — LORazepam (ATIVAN) tablet 2 mg: 2 mg | ORAL | @ 03:00:00

## 2020-07-14 MED ADMIN — potassium chloride (KLOR-CON) packet 40 mEq: 40 meq | ORAL | @ 13:00:00 | Stop: 2020-07-14

## 2020-07-14 MED ADMIN — sodium chloride tablet 1 g: 1 g | ORAL | @ 20:00:00

## 2020-07-14 NOTE — Unmapped (Signed)
Pt. Is confused, oriented to self, intermittently knows place. Pt. Trying to get OOB and go to a wedding and court intermittently throughout the day. Pt. Safety maintained, bed alarm on and restraint orders in place. Offered pt. Food and fluids as appropriate. Pt. Had significant coughing after offering breakfast in AM, so held solid foods this shift. Pt. Able to swallow pills and have small sips of fluid. CIWA scores low throughout shift. Somewhat redirectable. Vitals remain stable. Pt. Started AM shift in A-fib and converted to NSR/ST after 1000. Several incontinent urine episodes, Pt. Turning self in bed. All alarms on and within appropriate limits.     Problem: Adult Inpatient Plan of Care  Goal: Plan of Care Review  Outcome: Progressing  Goal: Patient-Specific Goal (Individualized)  Outcome: Progressing  Goal: Absence of Hospital-Acquired Illness or Injury  Outcome: Progressing  Intervention: Identify and Manage Fall Risk  Recent Flowsheet Documentation  Taken 07/14/2020 0800 by Amanda Pea, RN  Safety Interventions:   aspiration precautions   bed alarm   environmental modification   fall reduction program maintained   lighting adjusted for tasks/safety   low bed  Intervention: Prevent Infection  Recent Flowsheet Documentation  Taken 07/14/2020 0800 by Amanda Pea, RN  Infection Prevention: environmental surveillance performed  Goal: Optimal Comfort and Wellbeing  Outcome: Progressing  Goal: Readiness for Transition of Care  Outcome: Progressing  Goal: Rounds/Family Conference  Outcome: Progressing     Problem: Infection  Goal: Absence of Infection Signs and Symptoms  Outcome: Progressing  Intervention: Prevent or Manage Infection  Recent Flowsheet Documentation  Taken 07/14/2020 0800 by Amanda Pea, RN  Infection Management: aseptic technique maintained     Problem: Skin Injury Risk Increased  Goal: Skin Health and Integrity  Outcome: Progressing  Intervention: Optimize Skin Protection  Recent Flowsheet Documentation  Taken 07/14/2020 1200 by Amanda Pea, RN  Head of Bed [facility]) Positioning: HOB at 30-45 degrees  Taken 07/14/2020 1000 by Amanda Pea, RN  Head of Bed [facility]) Positioning: HOB at 30-45 degrees  Taken 07/14/2020 0800 by Amanda Pea, RN  Head of Bed [facility]) Positioning: HOB at 30-45 degrees     Problem: Fall Injury Risk  Goal: Absence of Fall and Fall-Related Injury  Outcome: Progressing  Intervention: Promote Injury-Free Environment  Recent Flowsheet Documentation  Taken 07/14/2020 0800 by Amanda Pea, RN  Safety Interventions:   aspiration precautions   bed alarm   environmental modification   fall reduction program maintained   lighting adjusted for tasks/safety   low bed     Problem: Self-Care Deficit  Goal: Improved Ability to Complete Activities of Daily Living  Outcome: Progressing     Problem: Behavioral Health Comorbidity  Goal: Maintenance of Behavioral Health Symptom Control  Outcome: Progressing     Problem: Heart Failure Comorbidity  Goal: Maintenance of Heart Failure Symptom Control  Outcome: Progressing     Problem: Hypertension Comorbidity  Goal: Blood Pressure in Desired Range  Outcome: Progressing     Problem: Non-Violent Restraints  Goal: Patient will remain free of restraint events  Outcome: Progressing  Goal: Patient will remain free of physical injury  Outcome: Progressing

## 2020-07-14 NOTE — Unmapped (Signed)
Patient rested on room air. Chest vest therapy done for airway clearance

## 2020-07-14 NOTE — Unmapped (Signed)
Family Medicine Inpatient Service    Progress Note    Team: [facility] (pgr [phone])    Hospital Day: 6    ASSESSMENT / PLAN:   Nicholas Rush is a 55 y.o. male with a past medical history significant for HTN, bipolar I??disorder, alcohol use??disorder c/b withdrawal seizures,??recurrent??C??diff??infection who presents s/p witnessed grand Mal seizure for monitored alcohol withdrawal and is found to have new onset LEE.     ## Alcohol Use Disorder - hx complicated W/d  Last EtOH > 96 hours ago. Overall much improved; He is afebrile and grossly oriented. Is tolerating PO.   - Wean PO Ativan to 1 mg q6h + with prn available   - PO thiamine and folate daily  - zofran PRN  - melatonin at bedtime  - home gabapentin dose 200mg  BID  - CM to provide resources. Will consider naltrexone.    ## Concern for Aspiration Pneumonia   Patient's presentation less concerning for infection with no fever, no sign of consolidation on lung exam, but patient did have multiple aspiration events early in his course.   - Augmentin 875 mg BID -6/28    ## Paroxysmal Afib  Predominantly rate controlled today; remains in Afib. Transitioned back to home oral diltiazem from drip.   - diltiazem 360 mg daily  - K >4, Mg >2     ## Myotonic Clonic Seizure   Most likely provoked seizure 2/2 alcohol or infection. Spot EEG during recent hospitalization not suggestive of underlying pathology. Neurology suggests most consistent with provoked seizure and recommends titration of depakote to therapeutic level.     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 Patient had psych admission where they attempted to switch to lithium, but patient did not tolerate. He has not been stable on antipsychotic monotherapy and has had reasonable stability with subtherapeutic dose of VPA.   - 1 g salt tab TID  - daily CMP  ????  ## Bipolar I Disorder: well-controlled on Depakote and Zyprexa. VPA subtherapeutic, but will not increase due to concern for SIADH and decreasing sodium level.   - continue zyprexa 20mg  at bedtime    ## Tobacco use disorder: Patient currently smoking 1PPD. Will place inpatient consult to tobacco cessation for toxic effects of tobacco  - Tobacco cessation consult  - Nicotine replacement therapy with patch, gum, lozenge PRN  ??  # Checklist:  - IVF None  - Tubes/Lines/Drains: PIV, ext catheter  - Diet: Holding for sedation; pending speech  - Bowel Regimen: Miralax prn  - DVT: SQ Lovenox  - Code Status:   Orders Placed This Encounter   Procedures   ??? Full Code     Standing Status:   Standing     Number of Occurrences:   1     - Dispo: ICU    [ ]  Anticipated Discharge Location: Home  [ ]  PT/OT/DME: PT/OT ordered  [ ]  CM/SW needs: Likely pending PT/OT recs, Substance abuse resources  [ ]  Meds/Rx:  Not yet prescribed. No special med needs  [ ]  Teaching: None anticipated  [ ]  Follow up appt: Appointment needed  [ ]  Excuse letter: None anticipated  [ ]  Transport: Private Needed    SUBJECTIVE:  Interval events: Patient is more alert and is oriented x3 this AM. He has no acute complaints.     Afebrile, CBC stable, Chem with K of 3.8 and Mg of 1.5, replacements ordered.  REVIEW OF SYSTEMS:  Pertinent positives and negatives per HPI. A complete review of systems otherwise negative.    PHYSICAL EXAM:      Intake/Output Summary (Last 24 hours) at 07/14/2020 0744  Last data filed at 07/13/2020 2200  Gross per 24 hour   Intake 1397.08 ml   Output 1125 ml   Net 272.08 ml       Recent Vitals:  Vitals:    07/14/20 0400   BP: 116/74   Pulse: 116   Resp: 23   Temp: 36.4 ??C   SpO2: 97%       GEN: Sitting in bed, drowsy, but easily arousable and does not fall back asleep quickly.       HEENT: NCAT, No scleral icterus. Conjunctiva non-erythematous. MMM.  CV: Tachycardic. No murmurs/rubs/gallops.  Pulm: Tachypneic. Satting well on 2L [place]. CTAB. Rhonchi present, coughing up sputum.   Abd: Flat.  Nontender. No guarding, rebound.  Normoactive bowel sounds.    Neuro: A&O x3. No focal deficits.  Ext: No peripheral edema. Palpable distal pulses.  Skin: No rashes or skin lesions.       LABS/ STUDIES:    All imaging, laboratory studies, and other pertinent tests including electrocardiography within the last 24 hours were reviewed and are summarized within the assessment and plan.     NUTRITION:                           Raynelle Fanning, MD PGY3

## 2020-07-14 NOTE — Unmapped (Signed)
Problem: Adult Inpatient Plan of Care  Goal: Plan of Care Review  Outcome: Ongoing - Unchanged   Patient remained on nasal cannula with no complications noted and airway clearance completed

## 2020-07-15 LAB — MAGNESIUM: MAGNESIUM: 1.5 mg/dL — ABNORMAL LOW (ref 1.6–2.6)

## 2020-07-15 LAB — CBC
HEMATOCRIT: 38.2 % — ABNORMAL LOW (ref 39.0–48.0)
HEMOGLOBIN: 13.5 g/dL (ref 12.9–16.5)
MEAN CORPUSCULAR HEMOGLOBIN CONC: 35.5 g/dL (ref 32.0–36.0)
MEAN CORPUSCULAR HEMOGLOBIN: 36.3 pg — ABNORMAL HIGH (ref 25.9–32.4)
MEAN CORPUSCULAR VOLUME: 102.4 fL — ABNORMAL HIGH (ref 77.6–95.7)
MEAN PLATELET VOLUME: 8.7 fL (ref 6.8–10.7)
PLATELET COUNT: 98 10*9/L — ABNORMAL LOW (ref 150–450)
RED BLOOD CELL COUNT: 3.73 10*12/L — ABNORMAL LOW (ref 4.26–5.60)
RED CELL DISTRIBUTION WIDTH: 14.6 % (ref 12.2–15.2)
WBC ADJUSTED: 4.1 10*9/L (ref 3.6–11.2)

## 2020-07-15 LAB — COMPREHENSIVE METABOLIC PANEL
ALBUMIN: 2.8 g/dL — ABNORMAL LOW (ref 3.4–5.0)
ALKALINE PHOSPHATASE: 55 U/L (ref 46–116)
ALT (SGPT): 10 U/L (ref 10–49)
ANION GAP: 9 mmol/L (ref 5–14)
AST (SGOT): 26 U/L (ref <=34)
BILIRUBIN TOTAL: 0.4 mg/dL (ref 0.3–1.2)
BLOOD UREA NITROGEN: <5 mg/dL — ABNORMAL LOW (ref 9–23)
CALCIUM: 8.5 mg/dL — ABNORMAL LOW (ref 8.7–10.4)
CHLORIDE: 93 mmol/L — ABNORMAL LOW (ref 98–107)
CO2: 24.7 mmol/L (ref 20.0–31.0)
CREATININE: 0.51 mg/dL — ABNORMAL LOW
EGFR CKD-EPI (2021) MALE: >90 mL/min/{1.73_m2} (ref >=60)
GLUCOSE RANDOM: 105 mg/dL (ref 70–179)
POTASSIUM: 3.9 mmol/L (ref 3.4–4.8)
PROTEIN TOTAL: 7.5 g/dL (ref 5.7–8.2)
SODIUM: 127 mmol/L — ABNORMAL LOW (ref 135–145)

## 2020-07-15 LAB — PHOSPHORUS: PHOSPHORUS: 3.2 mg/dL (ref 2.4–5.1)

## 2020-07-15 MED ADMIN — amoxicillin-clavulanate (AUGMENTIN) 875-125 mg per tablet 1 tablet: 875 mg | ORAL | @ 13:00:00 | Stop: 2020-07-17

## 2020-07-15 MED ADMIN — dilTIAZem (CARDIZEM CD) 24 hr capsule 360 mg: 360 mg | ORAL | @ 13:00:00

## 2020-07-15 MED ADMIN — magnesium sulfate 2gm/50mL IVPB: 2 g | INTRAVENOUS | @ 18:00:00 | Stop: 2020-07-15

## 2020-07-15 MED ADMIN — thiamine mononitrate (vit B1) tablet 100 mg: 100 mg | ORAL | @ 13:00:00

## 2020-07-15 MED ADMIN — gabapentin (NEURONTIN) capsule 200 mg: 200 mg | ORAL

## 2020-07-15 MED ADMIN — melatonin tablet 3 mg: 3 mg | ORAL

## 2020-07-15 MED ADMIN — LORazepam (ATIVAN) tablet 1 mg: 1 mg | ORAL | @ 09:00:00 | Stop: 2020-07-15

## 2020-07-15 MED ADMIN — amoxicillin-clavulanate (AUGMENTIN) 875-125 mg per tablet 1 tablet: 875 mg | ORAL | Stop: 2020-07-17

## 2020-07-15 MED ADMIN — potassium chloride (KLOR-CON) CR tablet 20 mEq: 20 meq | ORAL | @ 13:00:00 | Stop: 2020-07-15

## 2020-07-15 MED ADMIN — nicotine (NICODERM CQ) 21 mg/24 hr patch 1 patch: 1 | TRANSDERMAL | @ 13:00:00 | Stop: 2020-07-15

## 2020-07-15 MED ADMIN — sodium chloride tablet 1 g: 1 g | ORAL | @ 21:00:00

## 2020-07-15 MED ADMIN — magnesium oxide (MAG-OX) tablet 800 mg: 800 mg | ORAL | @ 23:00:00

## 2020-07-15 MED ADMIN — folic acid (FOLVITE) tablet 1 mg: 1 mg | ORAL | @ 13:00:00

## 2020-07-15 MED ADMIN — OLANZapine (ZyPREXA) tablet 20 mg: 20 mg | ORAL

## 2020-07-15 MED ADMIN — melatonin tablet 3 mg: 3 mg | ORAL | @ 23:00:00

## 2020-07-15 MED ADMIN — enoxaparin (LOVENOX) syringe 40 mg: 40 mg | SUBCUTANEOUS | @ 13:00:00

## 2020-07-15 MED ADMIN — nicotine (NICODERM CQ) 21 mg/24 hr patch 2 patch: 2 | TRANSDERMAL | @ 23:00:00

## 2020-07-15 MED ADMIN — gabapentin (NEURONTIN) capsule 200 mg: 200 mg | ORAL | @ 13:00:00

## 2020-07-15 MED ADMIN — sodium chloride tablet 1 g: 1 g | ORAL | @ 18:00:00

## 2020-07-15 MED ADMIN — magnesium sulfate 2gm/50mL IVPB: 2 g | INTRAVENOUS | @ 13:00:00 | Stop: 2020-07-15

## 2020-07-15 MED ADMIN — LORazepam (ATIVAN) tablet 1 mg: 1 mg | ORAL | @ 03:00:00

## 2020-07-15 MED ADMIN — sodium chloride tablet 1 g: 1 g | ORAL | @ 13:00:00

## 2020-07-15 MED ADMIN — divalproex ER (DEPAKOTE ER) extended release 24 hr tablet 750 mg: 750 mg | ORAL | @ 01:00:00

## 2020-07-15 NOTE — Unmapped (Signed)
Patient rested on 3 lpm nasal canula. Chest vest done for airway clearance

## 2020-07-15 NOTE — Unmapped (Signed)
SW received consult for tobacco cessation counseling; please see consult note by this writer dated 07/09/20.    Venancio Poisson, LCSW  Clinical Social Worker / Tobacco Treatment Specialist  [facility]  Phone: [phone]

## 2020-07-15 NOTE — Unmapped (Addendum)
Patient remains pretty confused, especially about place. Restraints remain in place to keep patient safe and in bed. Food and drink offered, pt is incontinent, condom cath in place. Pts wife came at beginning of shift and gave him partial bed bath. Pt swallowed pills fine.   Problem: Adult Inpatient Plan of Care  Goal: Optimal Comfort and Wellbeing  Outcome: Progressing     Problem: Self-Care Deficit  Goal: Improved Ability to Complete Activities of Daily Living  Outcome: Ongoing - Unchanged     Problem: Non-Violent Restraints  Goal: Patient will remain free of restraint events  Outcome: Ongoing - Unchanged  Goal: Patient will remain free of physical injury  Outcome: Progressing  Intervention: Utilize least restrictive measures  Recent Flowsheet Documentation  Taken 07/15/2020 0000 by Casandra Doffing, RN  Less Restrictive Alternative:  ??? Repositioning  ??? Comfort Measures  ??? Family/visitor at bedside  Taken 07/14/2020 2200 by Casandra Doffing, RN  Less Restrictive Alternative:  ??? Repositioning  ??? Comfort Measures  Taken 07/14/2020 2000 by Casandra Doffing, RN  Less Restrictive Alternative:  ??? Repositioning  ??? Comfort Measures  Intervention: Patient Monitoring  Recent Flowsheet Documentation  Taken 07/15/2020 0000 by Casandra Doffing, RN  Psychological Status/Visual Check: Agitated/restless  Circulation/Skin Integrity: No signs of injury  Range of Motion: Performed  Fluids: Offered  Food/Meal: Offered  Elimination: Offered  Taken 07/14/2020 2200 by Casandra Doffing, RN  Psychological Status/Visual Check: Agitated/restless  Circulation/Skin Integrity: No signs of injury  Range of Motion: Performed  Fluids: Offered  Food/Meal: Offered  Elimination: Offered  Taken 07/14/2020 2000 by Casandra Doffing, RN  Psychological Status/Visual Check: Agitated/restless  Circulation/Skin Integrity: No signs of injury  Range of Motion: Performed  Fluids: Offered  Food/Meal: Offered  Elimination: Offered  Intervention: Patient Education  Recent Flowsheet Documentation  Taken 07/14/2020 2000 by Casandra Doffing, RN  Criteria Explained: Yes  Patient's Response: No evidence of learning  Family Notification: Spouse/significant other     Problem: Infection  Goal: Absence of Infection Signs and Symptoms  Outcome: Progressing

## 2020-07-15 NOTE — Unmapped (Signed)
Family Medicine Inpatient Service    Progress Note    Team: [facility] (pgr [phone])    Hospital Day: 7    ASSESSMENT / PLAN:   Nicholas Rush is a 55 y.o. male with a past medical history significant for HTN, bipolar I??disorder, alcohol use??disorder c/b withdrawal seizures,??recurrent??C??diff??infection, who presents s/p witnessed grand mal seizure for monitored alcohol withdrawal.     ## Alcohol Use Disorder - hx complicated W/d  Last EtOH > 1 week ago. Overall much improved; He is afebrile, grossly oriented, and tolerating PO. CIWA scores consistently < 4.  - Discontinued CIWA and Ativan  - PO thiamine and folate daily  - zofran PRN  - melatonin at bedtime, delirium precautions ordered  - home gabapentin dose 200mg  BID  - CM to provide resources. Plan to restart home Naltrexone 50 mg with improving orientation.    ## Concern for Aspiration Pneumonia   Patient's presentation less concerning for infection with no fever, no sign of consolidation on lung exam, but patient did have multiple aspiration events early in his course.   - Augmentin 875 mg BID -6/28  - SLP rec level 6 solids, thin liquids, supplementation with ensures    ## Paroxysmal Afib  Predominantly rate controlled today with Mg2+ of 1.5 and K+ of 3.9; remains in Afib. Transitioned back to home oral diltiazem from drip.  - diltiazem 360 mg daily  - IV Mg2+ 4g and PO K+ 20 mEq given with goal K >4, Mg >2   - RESTART home Mag-Ox 800 mg TID    ## Myotonic Clonic Seizure   Most likely provoked seizure 2/2 alcohol or infection. Spot EEG during recent hospitalization not suggestive of underlying pathology. Neurology suggests most consistent with provoked seizure and recommends titration of depakote to therapeutic level.     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 Patient had psych admission where they attempted to switch to lithium, but patient did not tolerate. He has not been stable on antipsychotic monotherapy and has had reasonable stability with subtherapeutic dose of VPA.   - 1 g salt tab TID  - daily CMP  ????  ## Bipolar I Disorder: well-controlled on Depakote and Zyprexa. VPA subtherapeutic, but will not increase due to concern for SIADH and decreasing sodium level.   - continue zyprexa 20mg  at bedtime    ## Tobacco use disorder: Patient currently smoking 2.5 PPD. Will place inpatient consult to tobacco cessation for toxic effects of tobacco  - Tobacco cessation consult placed on 6/27, appreciate recs  [ ]  Evaluate desire to initiate pharmacologic therapy  - INCREASE nicotine replacement therapy with patch from 21 mg to 42 mg daily, CHANGE prn lozenge to gum per pt preference  ??  # Checklist:  - IVF: None  - Tubes/Lines/Drains: PIV, ext catheter  - Diet: Level 6 solids with thin liquids, supplementation with Ensure  - Bowel Regimen: Miralax prn, loperamide prn  - DVT: SQ Lovenox  - Code Status:   Orders Placed This Encounter   Procedures   ??? Full Code     Standing Status:   Standing     Number of Occurrences:   1     - Dispo: Intermediate    [ ]  Anticipated Discharge Location: Home  [ ]  PT/OT/DME: 5xL  [ ]  CM/SW needs: pursuing SNF versus home with PT, Substance use resources  [ ]  Meds/Rx:  Not yet prescribed. No special med needs  [ ]   Teaching: None anticipated  [ ]  Follow up appt: Appointment needed  [ ]  Excuse letter: None anticipated  [ ]  Transport: Private Needed    SUBJECTIVE:  Interval events:   - Pt disoriented following being woken up around 0800, however, grossly oriented in the afternoon  - RN: pt disoriented overnight, restraints and condom cath, tolerating PO medications    REVIEW OF SYSTEMS:  Pertinent positives and negatives per HPI. A complete review of systems otherwise negative.    PHYSICAL EXAM:      Intake/Output Summary (Last 24 hours) at 07/15/2020 0937  Last data filed at 07/15/2020 0800  Gross per 24 hour   Intake 220 ml   Output 1725 ml   Net -1505 ml       Recent Vitals:  Vitals:    07/15/20 0800   BP: 146/99   Pulse: 90   Resp: 23   Temp: 36.7 ??C   SpO2: 99%       GEN: Lying in bed, drowsy, but easily arousable and does not fall back asleep quickly.       HEENT: Extra-ocular movements grossly intact. No active bleeding from mouth.  CV: Irregularly irregular. No murmurs/rubs/gallops. Cap refill < 2 seconds bilaterally. Radially pulses 2+ bilaterally. Palpable distal pulses. Minimal LE edema bilaterally.  Pulm: Clear to auscultation in anterior lung fields bilaterally. No wheezing, rhonchi, or crackles.  Abd: Flat and soft.  Nontender. No guarding, rebound.  Normoactive bowel sounds.    GU: Condom catheter in place.  Neuro: Disoriented. Moving all 4 extremities spontaneously.  Skin: No rashes or skin lesions.       LABS/ STUDIES:    All imaging, laboratory studies, and other pertinent tests including electrocardiography within the last 24 hours were reviewed and are summarized within the assessment and plan.     NUTRITION:  - Advance diet to Level 6 solids with thin liquids, supplementation with Ensure    Willis Modena, MD PGY1

## 2020-07-16 LAB — COMPREHENSIVE METABOLIC PANEL
ALBUMIN: 2.7 g/dL — ABNORMAL LOW (ref 3.4–5.0)
ALKALINE PHOSPHATASE: 58 U/L (ref 46–116)
ALT (SGPT): 12 U/L (ref 10–49)
ANION GAP: 8 mmol/L (ref 5–14)
AST (SGOT): 23 U/L (ref <=34)
BILIRUBIN TOTAL: 0.4 mg/dL (ref 0.3–1.2)
BLOOD UREA NITROGEN: 8 mg/dL — ABNORMAL LOW (ref 9–23)
BUN / CREAT RATIO: 14
CALCIUM: 8.3 mg/dL — ABNORMAL LOW (ref 8.7–10.4)
CHLORIDE: 94 mmol/L — ABNORMAL LOW (ref 98–107)
CO2: 24.7 mmol/L (ref 20.0–31.0)
CREATININE: 0.58 mg/dL — ABNORMAL LOW
EGFR CKD-EPI (2021) MALE: >90 mL/min/{1.73_m2} (ref >=60)
GLUCOSE RANDOM: 106 mg/dL (ref 70–179)
POTASSIUM: 4 mmol/L (ref 3.4–4.8)
PROTEIN TOTAL: 7.4 g/dL (ref 5.7–8.2)
SODIUM: 127 mmol/L — ABNORMAL LOW (ref 135–145)

## 2020-07-16 LAB — MAGNESIUM: MAGNESIUM: 1.7 mg/dL (ref 1.6–2.6)

## 2020-07-16 LAB — CBC
HEMATOCRIT: 38.2 % — ABNORMAL LOW (ref 39.0–48.0)
HEMOGLOBIN: 13.7 g/dL (ref 12.9–16.5)
MEAN CORPUSCULAR HEMOGLOBIN CONC: 35.8 g/dL (ref 32.0–36.0)
MEAN CORPUSCULAR HEMOGLOBIN: 36.7 pg — ABNORMAL HIGH (ref 25.9–32.4)
MEAN CORPUSCULAR VOLUME: 102.5 fL — ABNORMAL HIGH (ref 77.6–95.7)
MEAN PLATELET VOLUME: 8.9 fL (ref 6.8–10.7)
PLATELET COUNT: 120 10*9/L — ABNORMAL LOW (ref 150–450)
RED BLOOD CELL COUNT: 3.73 10*12/L — ABNORMAL LOW (ref 4.26–5.60)
RED CELL DISTRIBUTION WIDTH: 14.5 % (ref 12.2–15.2)
WBC ADJUSTED: 3.7 10*9/L (ref 3.6–11.2)

## 2020-07-16 LAB — PHOSPHORUS: PHOSPHORUS: 3.6 mg/dL (ref 2.4–5.1)

## 2020-07-16 MED ADMIN — sodium chloride tablet 1 g: 1 g | ORAL | @ 21:00:00

## 2020-07-16 MED ADMIN — folic acid (FOLVITE) tablet 1 mg: 1 mg | ORAL | @ 13:00:00

## 2020-07-16 MED ADMIN — OLANZapine (ZyPREXA) tablet 20 mg: 20 mg | ORAL | @ 01:00:00

## 2020-07-16 MED ADMIN — enoxaparin (LOVENOX) syringe 40 mg: 40 mg | SUBCUTANEOUS | @ 13:00:00

## 2020-07-16 MED ADMIN — sodium chloride tablet 1 g: 1 g | ORAL | @ 16:00:00

## 2020-07-16 MED ADMIN — dilTIAZem (CARDIZEM CD) 24 hr capsule 360 mg: 360 mg | ORAL | @ 13:00:00

## 2020-07-16 MED ADMIN — amoxicillin-clavulanate (AUGMENTIN) 875-125 mg per tablet 1 tablet: 875 mg | ORAL | @ 01:00:00 | Stop: 2020-07-17

## 2020-07-16 MED ADMIN — polyethylene glycol (MIRALAX) packet 17 g: 17 g | ORAL | @ 15:00:00

## 2020-07-16 MED ADMIN — magnesium oxide (MAG-OX) tablet 800 mg: 800 mg | ORAL | @ 10:00:00

## 2020-07-16 MED ADMIN — thiamine mononitrate (vit B1) tablet 100 mg: 100 mg | ORAL | @ 13:00:00

## 2020-07-16 MED ADMIN — guaiFENesin (ROBITUSSIN) oral syrup: 200 mg | ORAL | @ 20:00:00

## 2020-07-16 MED ADMIN — ipratropium-albuteroL (DUO-NEB) 0.5-2.5 mg/3 mL nebulizer solution 3 mL: 3 mL | RESPIRATORY_TRACT | @ 12:00:00 | Stop: 2020-07-16

## 2020-07-16 MED ADMIN — divalproex ER (DEPAKOTE ER) extended release 24 hr tablet 750 mg: 750 mg | ORAL | @ 01:00:00

## 2020-07-16 MED ADMIN — gabapentin (NEURONTIN) capsule 200 mg: 200 mg | ORAL | @ 13:00:00

## 2020-07-16 MED ADMIN — magnesium sulfate 2gm/50mL IVPB: 2 g | INTRAVENOUS | @ 15:00:00 | Stop: 2020-07-16

## 2020-07-16 MED ADMIN — gabapentin (NEURONTIN) capsule 200 mg: 200 mg | ORAL | @ 01:00:00

## 2020-07-16 MED ADMIN — sodium chloride tablet 1 g: 1 g | ORAL | @ 13:00:00

## 2020-07-16 MED ADMIN — guaiFENesin (ROBITUSSIN) oral syrup: 200 mg | ORAL | @ 15:00:00

## 2020-07-16 MED ADMIN — magnesium oxide (MAG-OX) tablet 800 mg: 800 mg | ORAL | @ 22:00:00

## 2020-07-16 MED ADMIN — potassium chloride (KLOR-CON) packet 20 mEq: 20 meq | ORAL | @ 15:00:00 | Stop: 2020-07-16

## 2020-07-16 MED ADMIN — naltrexone (DEPADE) tablet 50 mg: 50 mg | ORAL | @ 15:00:00

## 2020-07-16 MED ADMIN — nicotine (NICODERM CQ) 21 mg/24 hr patch 2 patch: 2 | TRANSDERMAL | @ 13:00:00

## 2020-07-16 MED ADMIN — amoxicillin-clavulanate (AUGMENTIN) 875-125 mg per tablet 1 tablet: 875 mg | ORAL | @ 13:00:00 | Stop: 2020-07-16

## 2020-07-16 MED ADMIN — magnesium oxide (MAG-OX) tablet 800 mg: 800 mg | ORAL | @ 16:00:00

## 2020-07-16 MED ADMIN — melatonin tablet 3 mg: 3 mg | ORAL | @ 22:00:00

## 2020-07-16 NOTE — Unmapped (Signed)
Pt sating 96% on RA earlier in shift. Chest vest done. Placed on 3L for night time. No problems t/o shift.

## 2020-07-16 NOTE — Unmapped (Signed)
No acute events overnight. Pt remains on 3L [place]. Voiding in condom cath. No new skin breakdown noted. Denies pain.   Problem: Adult Inpatient Plan of Care  Goal: Plan of Care Review  Outcome: Progressing  Goal: Patient-Specific Goal (Individualized)  Outcome: Progressing  Goal: Absence of Hospital-Acquired Illness or Injury  Outcome: Progressing  Intervention: Identify and Manage Fall Risk  Recent Flowsheet Documentation  Taken 07/16/2020 0400 by Carin Hock, RN  Safety Interventions:   aspiration precautions   bleeding precautions   fall reduction program maintained   low bed  Taken 07/16/2020 0000 by Carin Hock, RN  Safety Interventions:   aspiration precautions   bed alarm   bleeding precautions   fall reduction program maintained   low bed  Intervention: Prevent Skin Injury  Recent Flowsheet Documentation  Taken 07/16/2020 0400 by Carin Hock, RN  Skin Protection: adhesive use limited  Taken 07/16/2020 0000 by Carin Hock, RN  Skin Protection:   adhesive use limited   incontinence pads utilized   transparent dressing maintained  Intervention: Prevent and Manage VTE (Venous Thromboembolism) Risk  Recent Flowsheet Documentation  Taken 07/16/2020 0600 by Carin Hock, RN  Activity Management: bedrest  Taken 07/16/2020 0400 by Carin Hock, RN  Activity Management: bedrest  Taken 07/16/2020 0200 by Carin Hock, RN  Activity Management: bedrest  Taken 07/16/2020 0000 by Carin Hock, RN  Activity Management: bedrest  VTE Prevention/Management: anticoagulant therapy  Intervention: Prevent Infection  Recent Flowsheet Documentation  Taken 07/16/2020 0400 by Carin Hock, RN  Infection Prevention:   visitors restricted/screened   single patient room provided   rest/sleep promoted   personal protective equipment utilized   hand hygiene promoted  Taken 07/16/2020 0000 by Carin Hock, RN  Infection Prevention:   single patient room provided   rest/sleep promoted   personal protective equipment utilized   hand hygiene promoted  Goal: Optimal Comfort and Wellbeing  Outcome: Progressing  Goal: Readiness for Transition of Care  Outcome: Progressing  Goal: Rounds/Family Conference  Outcome: Progressing

## 2020-07-16 NOTE — Unmapped (Signed)
Patient downgraded to floor status due to improved mentation and decreased O2 requirements.     Neuro: Aox3, disoriented to situation.   CV: BP has been stable, and remains in NSR.   Resp: He has been able to maintain on room air during the daytime and uses 2-3L of O2 at night.  GI: Urine output has been good, and pt had BM today.     His restraints were discontinued this afternoon and has been doing well without. Remains hospitalized pending PT eval and disposition planning.     Problem: Infection  Goal: Absence of Infection Signs and Symptoms  Outcome: Ongoing - Unchanged     Problem: Skin Injury Risk Increased  Goal: Skin Health and Integrity  Outcome: Ongoing - Unchanged  Intervention: Optimize Skin Protection  Recent Flowsheet Documentation  Taken 07/16/2020 0800 by Johnsie Cancel, RN  Pressure Reduction Techniques:   frequent weight shift encouraged   heels elevated off bed   weight shift assistance provided  Head of Bed (HOB) Positioning: HOB at 30 degrees  Pressure Reduction Devices:   positioning supports utilized   pressure-redistributing mattress utilized  Skin Protection:   cleansing with dimethicone incontinence wipes   incontinence pads utilized   tubing/devices free from skin contact     Problem: Fall Injury Risk  Goal: Absence of Fall and Fall-Related Injury  Outcome: Ongoing - Unchanged     Problem: Self-Care Deficit  Goal: Improved Ability to Complete Activities of Daily Living  Outcome: Ongoing - Unchanged     Problem: Adult Inpatient Plan of Care  Goal: Plan of Care Review  Outcome: Progressing  Goal: Patient-Specific Goal (Individualized)  Outcome: Progressing  Goal: Absence of Hospital-Acquired Illness or Injury  Outcome: Progressing  Intervention: Prevent Skin Injury  Recent Flowsheet Documentation  Taken 07/16/2020 0800 by Johnsie Cancel, RN  Skin Protection:   cleansing with dimethicone incontinence wipes   incontinence pads utilized   tubing/devices free from skin contact  Goal: Optimal Comfort and Wellbeing  Outcome: Progressing  Goal: Readiness for Transition of Care  Outcome: Progressing  Goal: Rounds/Family Conference  Outcome: Progressing     Problem: Behavioral Health Comorbidity  Goal: Maintenance of Behavioral Health Symptom Control  Outcome: Resolved     Problem: Heart Failure Comorbidity  Goal: Maintenance of Heart Failure Symptom Control  Outcome: Resolved     Problem: Hypertension Comorbidity  Goal: Blood Pressure in Desired Range  Outcome: Resolved     Problem: Non-Violent Restraints  Goal: Patient will remain free of restraint events  Outcome: Resolved  Goal: Patient will remain free of physical injury  Outcome: Resolved     Problem: Non-Violent Restraints  Intervention: Utilize least restrictive measures  Recent Flowsheet Documentation  Taken 07/16/2020 1200 by Johnsie Cancel, RN  Less Restrictive Alternative:   Repositioning   Alarm  Taken 07/16/2020 1000 by Johnsie Cancel, RN  Less Restrictive Alternative:   Repositioning   Alarm  Taken 07/16/2020 0800 by Johnsie Cancel, RN  Less Restrictive Alternative:   Repositioning   Alarm  Intervention: Patient Monitoring  Recent Flowsheet Documentation  Taken 07/16/2020 1200 by Johnsie Cancel, RN  Psychological Status/Visual Check: Asleep  Circulation/Skin Integrity: No signs of injury  Range of Motion: Patient asleep  Fluids: Offered  Food/Meal: Offered  Elimination: Offered  Taken 07/16/2020 1000 by Johnsie Cancel, RN  Psychological Status/Visual Check: Asleep  Circulation/Skin Integrity: No signs of injury  Range of Motion: Patient asleep  Fluids: Offered  Food/Meal: Offered  Elimination: Offered  Taken 07/16/2020 0800 by Johnsie Cancel, RN  Psychological Status/Visual Check: Asleep  Circulation/Skin Integrity: No signs of injury  Range of Motion: Patient asleep  Fluids: Offered  Food/Meal: Offered  Elimination: Offered  Intervention: Patient Education  Recent Flowsheet Documentation  Taken 07/16/2020 1200 by Johnsie Cancel, RN  Criteria Explained: Yes  Patient's Response: No evidence of learning  Family Notification: Spouse/significant other  Taken 07/16/2020 1000 by Johnsie Cancel, RN  Criteria Explained: Yes  Patient's Response: No evidence of learning  Family Notification: Spouse/significant other  Taken 07/16/2020 0800 by Johnsie Cancel, RN  Criteria Explained: Yes  Patient's Response: No evidence of learning  Family Notification: Spouse/significant other

## 2020-07-16 NOTE — Unmapped (Signed)
Family Medicine Inpatient Service    Progress Note    Team: [facility] (pgr [phone])    Hospital Day: 8    ASSESSMENT / PLAN:   Nicholas Rush is a 55 y.o. male with a past medical history significant for HTN, bipolar I??disorder, alcohol use??disorder c/b withdrawal seizures,??recurrent??C??diff??infection, who presents s/p witnessed grand mal seizure for monitored alcohol withdrawal.     ## Alcohol Use Disorder - hx complicated W/d  Last EtOH > 1 week ago. Overall much improved; He is afebrile, grossly oriented, and tolerating PO. CIWA scores consistently < 4, protocol and prn ativan discontinued on 6/27 without complication.  - PO thiamine and folate daily  - zofran PRN  - melatonin at bedtime, delirium precautions  - home gabapentin dose 200mg  BID  - RESTART home Naltrexone 50 mg daily    ## Concern for Aspiration Pneumonia - Cough  Patient's presentation less concerning for infection with no fever, no sign of consolidation on lung exam, but patient did have multiple aspiration events early in his course.   - Augmentin 875 mg BID completed on 6/28  - OTO duo-neb for expiratory wheezing  - START prn Robitussin syrup  - START spirometry at bedside  - Continue SLP rec level 6 solids, thin liquids, supplementation with ensures    ## Paroxysmal Afib  Predominantly rate controlled today with Mg2+ of 1.7 and K+ of 4.0; remains in Afib. Transitioned back to home oral diltiazem from drip.  - diltiazem 360 mg daily  - IV Mg2+ 2g and PO K+ 20 mEq given with goal K >4, Mg >2   - continue home Mag-Ox 800 mg TID    ## Myotonic Clonic Seizure   Most likely provoked seizure 2/2 alcohol or infection. Spot EEG during recent hospitalization not suggestive of underlying pathology. Neurology suggests most consistent with provoked seizure and recommends titration of depakote to therapeutic level.     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 Patient had psych admission where they attempted to switch to lithium, but patient did not tolerate. He has not been stable on antipsychotic monotherapy and has had reasonable stability with subtherapeutic dose of VPA.   - 1 g salt tab TID  - daily bmp  ????  ## Bipolar I Disorder: well-controlled on Depakote and Zyprexa. VPA subtherapeutic, but will not increase due to concern for SIADH and decreasing sodium level.   - continue zyprexa 20mg  at bedtime    ## Tobacco use disorder: Patient currently smoking 2.5 PPD. SW provided education and option for outpatient follow up for tobacco cessation on 6/21.  - Nicotine replacement therapy with patch 42 mg daily, prn gum  ??  # Checklist:  - IVF: None  - Tubes/Lines/Drains: PIV, ext catheter  - Diet: Level 6 solids with thin liquids, supplementation with Ensure  - Bowel Regimen: Miralax daily, Senna nightly, discontinued Loperamide  - DVT: SQ Lovenox  - Code Status:   Orders Placed This Encounter   Procedures   ??? Full Code     Standing Status:   Standing     Number of Occurrences:   1     - Dispo: Floor, previously Intermediate    [ ]  Anticipated Discharge Location: Home  [ ]  PT/OT/DME: 5xL  [ ]  CM/SW needs: pursuing SNF versus home with PT, Substance use resources  [ ]  Meds/Rx:  Not yet prescribed. No special med needs  [ ]  Teaching: None anticipated  [ ]   Follow up appt: Appointment needed  [ ]  Excuse letter: None anticipated  [ ]  Transport: Private Needed    SUBJECTIVE:   - Pt without any complaints, though does cough for prolonged periods of time  - RT: spO2 96% on RA during daytime, 3L overnight, chest vest for airway clearance, last BM on 6/26    REVIEW OF SYSTEMS:  Pertinent positives and negatives per HPI. A complete review of systems otherwise negative.    PHYSICAL EXAM:      Intake/Output Summary (Last 24 hours) at 07/16/2020 0634  Last data filed at 07/16/2020 0000  Gross per 24 hour   Intake 835 ml   Output 1150 ml   Net -315 ml       Recent Vitals:  Vitals:    07/16/20 0000   BP: 106/80 Pulse: 91   Resp: 22   Temp:    SpO2: 97%       GEN: Lying in bed, lap belt and left wrist restraints in place, eating breakfast with right arm   HEENT: Extra-ocular movements grossly intact. No active bleeding from mouth.  CV: Irregularly irregular. No murmurs/rubs/gallops. Cap refill < 2 seconds bilaterally. Radially pulses 2+ bilaterally. Palpable distal pulses. Minimal LE edema bilaterally.  Pulm: Coughs throughout exam. Expiratory wheezing in anterior lung fields bilaterally. No rhonchi or crackles.  Abd: Flat and soft.  Nontender. No guarding, rebound.  Normoactive bowel sounds.    GU: Condom catheter in place.  Neuro: Grossly oriented. Moving all 4 extremities spontaneously.  Skin: No rashes or skin lesions.       LABS/ STUDIES:    All imaging, laboratory studies, and other pertinent tests including electrocardiography within the last 24 hours were reviewed and are summarized within the assessment and plan.     NUTRITION:  - Level 6 solids with thin liquids, supplementation with Ensure  - Advance per SLP recs    Willis Modena, MD PGY1

## 2020-07-16 NOTE — Unmapped (Signed)
Patient rested on 3 lpm nasal canula. Chest vest done for airway clearance

## 2020-07-17 LAB — CBC
HEMATOCRIT: 38.1 % — ABNORMAL LOW (ref 39.0–48.0)
HEMOGLOBIN: 13.2 g/dL (ref 12.9–16.5)
MEAN CORPUSCULAR HEMOGLOBIN CONC: 34.7 g/dL (ref 32.0–36.0)
MEAN CORPUSCULAR HEMOGLOBIN: 35.5 pg — ABNORMAL HIGH (ref 25.9–32.4)
MEAN CORPUSCULAR VOLUME: 102.3 fL — ABNORMAL HIGH (ref 77.6–95.7)
MEAN PLATELET VOLUME: 9.1 fL (ref 6.8–10.7)
PLATELET COUNT: 144 10*9/L — ABNORMAL LOW (ref 150–450)
RED BLOOD CELL COUNT: 3.72 10*12/L — ABNORMAL LOW (ref 4.26–5.60)
RED CELL DISTRIBUTION WIDTH: 14.6 % (ref 12.2–15.2)
WBC ADJUSTED: 3.6 10*9/L (ref 3.6–11.2)

## 2020-07-17 LAB — BASIC METABOLIC PANEL
ANION GAP: 8 mmol/L (ref 5–14)
BLOOD UREA NITROGEN: 8 mg/dL — ABNORMAL LOW (ref 9–23)
BUN / CREAT RATIO: 16
CALCIUM: 8 mg/dL — ABNORMAL LOW (ref 8.7–10.4)
CHLORIDE: 96 mmol/L — ABNORMAL LOW (ref 98–107)
CO2: 24.3 mmol/L (ref 20.0–31.0)
CREATININE: 0.49 mg/dL — ABNORMAL LOW
EGFR CKD-EPI (2021) MALE: >90 mL/min/{1.73_m2} (ref >=60)
GLUCOSE RANDOM: 144 mg/dL (ref 70–179)
POTASSIUM: 3.3 mmol/L — ABNORMAL LOW (ref 3.4–4.8)
SODIUM: 128 mmol/L — ABNORMAL LOW (ref 135–145)

## 2020-07-17 LAB — MAGNESIUM: MAGNESIUM: 1.8 mg/dL (ref 1.6–2.6)

## 2020-07-17 MED ADMIN — sodium chloride tablet 1 g: 1 g | ORAL | @ 13:00:00

## 2020-07-17 MED ADMIN — sodium chloride tablet 1 g: 1 g | ORAL | @ 17:00:00

## 2020-07-17 MED ADMIN — naltrexone (DEPADE) tablet 50 mg: 50 mg | ORAL | @ 13:00:00

## 2020-07-17 MED ADMIN — enoxaparin (LOVENOX) syringe 40 mg: 40 mg | SUBCUTANEOUS | @ 13:00:00

## 2020-07-17 MED ADMIN — guaiFENesin (ROBITUSSIN) oral syrup: 200 mg | ORAL | @ 03:00:00

## 2020-07-17 MED ADMIN — dilTIAZem (CARDIZEM CD) 24 hr capsule 360 mg: 360 mg | ORAL | @ 14:00:00

## 2020-07-17 MED ADMIN — dilTIAZem (CARDIZEM) 125 mg in sodium chloride (NS) 0.9 % 125 mL infusion: 10 mg/h | INTRAVENOUS | @ 06:00:00 | Stop: 2020-07-17

## 2020-07-17 MED ADMIN — dilTIAZem (CARDIZEM) injection 10 mg: 10 mg | INTRAVENOUS | Stop: 2020-07-16

## 2020-07-17 MED ADMIN — magnesium oxide (MAG-OX) tablet 800 mg: 800 mg | ORAL | @ 11:00:00

## 2020-07-17 MED ADMIN — loperamide (IMODIUM) capsule 4 mg: 4 mg | ORAL | @ 03:00:00 | Stop: 2020-07-16

## 2020-07-17 MED ADMIN — magnesium oxide (MAG-OX) tablet 800 mg: 800 mg | ORAL | @ 17:00:00

## 2020-07-17 MED ADMIN — gabapentin (NEURONTIN) capsule 200 mg: 200 mg | ORAL | @ 01:00:00

## 2020-07-17 MED ADMIN — melatonin tablet 3 mg: 3 mg | ORAL | @ 21:00:00

## 2020-07-17 MED ADMIN — loperamide (IMODIUM) capsule 2 mg: 2 mg | ORAL

## 2020-07-17 MED ADMIN — gabapentin (NEURONTIN) capsule 200 mg: 200 mg | ORAL | @ 13:00:00

## 2020-07-17 MED ADMIN — magnesium sulfate 2gm/50mL IVPB: 2 g | INTRAVENOUS | @ 13:00:00 | Stop: 2020-07-17

## 2020-07-17 MED ADMIN — potassium chloride (KLOR-CON) packet 40 mEq: 40 meq | ORAL | @ 13:00:00 | Stop: 2020-07-17

## 2020-07-17 MED ADMIN — amoxicillin-clavulanate (AUGMENTIN) 875-125 mg per tablet 1 tablet: 875 mg | ORAL | @ 02:00:00 | Stop: 2020-07-16

## 2020-07-17 MED ADMIN — nicotine (NICODERM CQ) 21 mg/24 hr patch 2 patch: 2 | TRANSDERMAL | @ 13:00:00

## 2020-07-17 MED ADMIN — magnesium sulfate 2gm/50mL IVPB: 2 g | INTRAVENOUS | @ 02:00:00 | Stop: 2020-07-16

## 2020-07-17 MED ADMIN — OLANZapine (ZyPREXA) tablet 20 mg: 20 mg | ORAL | @ 01:00:00

## 2020-07-17 MED ADMIN — sodium chloride tablet 1 g: 1 g | ORAL | @ 21:00:00

## 2020-07-17 MED ADMIN — folic acid (FOLVITE) tablet 1 mg: 1 mg | ORAL | @ 13:00:00

## 2020-07-17 MED ADMIN — guaiFENesin (ROBITUSSIN) oral syrup: 200 mg | ORAL | @ 13:00:00

## 2020-07-17 MED ADMIN — thiamine mononitrate (vit B1) tablet 100 mg: 100 mg | ORAL | @ 13:00:00

## 2020-07-17 MED ADMIN — divalproex ER (DEPAKOTE ER) extended release 24 hr tablet 750 mg: 750 mg | ORAL | @ 01:00:00

## 2020-07-17 MED ADMIN — dilTIAZem (CARDIZEM) injection 10 mg: 10 mg | INTRAVENOUS | @ 01:00:00 | Stop: 2020-07-16

## 2020-07-17 NOTE — Unmapped (Signed)
Patient rested on 3 lpm O2 nasal canula. Chest vest done for airway clearance

## 2020-07-17 NOTE — Unmapped (Signed)
Family Medicine Inpatient Service    Progress Note    Team: [facility] (pgr [phone])    Hospital Day: 9    ASSESSMENT / PLAN:   Nicholas Rush is a 55 y.o. male with a past medical history significant for HTN, bipolar I??disorder, alcohol use??disorder c/b withdrawal seizures,??recurrent??C??diff??infection, who presents s/p witnessed grand mal seizure for monitored alcohol withdrawal.     ## Alcohol Use Disorder - hx complicated W/d  Last EtOH > 1 week ago. Overall much improved; He is afebrile, grossly oriented, and tolerating PO. CIWA scores consistently < 4, protocol and prn ativan discontinued on 6/27 without complication.  - PO thiamine and folate daily  - zofran PRN  - melatonin at bedtime, delirium precautions  - home gabapentin dose 200mg  BID  - Naltrexone 50 mg daily    ## Concern for Aspiration Pneumonia - Cough  Patient's presentation less concerning for infection with no fever, no sign of consolidation on lung exam, but patient did have multiple aspiration events early in his course. S/p Augmentin 875 mg BID x 5 days.  - Prn Robitussin syrup  - Incentive spirometry, chest physiotherapy  - Continue SLP rec level 6 solids, thin liquids, supplementation with ensures    ## Paroxysmal Afib  Episode of HR in the 140s overnight, which responded to Mg2+ and Diltiazem drip. Now, predominantly rate controlled with Mg2+ of 1.8 and K+ of 3.3; remains in Afib. Transitioned back to home oral diltiazem from drip.  - diltiazem 360 mg daily  - IV Mg2+ 4g and PO K+ 80 mEq given with goal K >4, Mg >2   - continue home Mag-Ox 800 mg TID    ## Myotonic Clonic Seizure   Most likely provoked seizure 2/2 alcohol or infection. Spot EEG during recent hospitalization not suggestive of underlying pathology. Neurology suggests most consistent with provoked seizure and recommends titration of depakote to therapeutic level.     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 Patient had psych admission where they attempted to switch to lithium, but patient did not tolerate. He has not been stable on antipsychotic monotherapy and has had reasonable stability with subtherapeutic dose of VPA.   - 1 g salt tab TID  - daily bmp  ????  ## Bipolar I Disorder: well-controlled on Depakote and Zyprexa. VPA subtherapeutic, but will not increase due to concern for SIADH and decreasing sodium level.   - continue zyprexa 20mg  at bedtime    ## Tobacco use disorder: Patient currently smoking 2.5 PPD. SW provided education and option for outpatient follow up for tobacco cessation on 6/21.  - Nicotine replacement therapy with patch 42 mg daily, prn gum  ??  # Checklist:  - IVF: None  - Tubes/Lines/Drains: PIV, ext catheter  - Diet: Level 6 solids with thin liquids, supplementation with Ensure  - Bowel Regimen: changed Miralax daily to prn, discontinued Senna nightly, restarted prn Loperamide  - DVT: SQ Lovenox  - Code Status:   Orders Placed This Encounter   Procedures   ??? Full Code     Standing Status:   Standing     Number of Occurrences:   1     - Dispo: Floor    [ ]  Anticipated Discharge Location: likely home  [ ]  PT/OT/DME: 5xL  [ ]  CM/SW needs: pursuing SNF versus home with PT, Substance use resources  [ ]  Meds/Rx:  Not yet prescribed. No special med needs  [ ]   Teaching: None anticipated  [ ]  Follow up appt: Appointment needed  [ ]  Excuse letter: None anticipated  [ ]  Transport: Private Needed    SUBJECTIVE:   - Pt without any complaints  - RN: episode of diarrhea that improved with mg2+ and loperamide, atrial fibrillation w/hr in 140s- responded to 2g IV mg2+ and diltiazem drip  - Spouse: pt has been less confused mid-day, however, continues to be disoriented in afternoons    REVIEW OF SYSTEMS:  Pertinent positives and negatives per HPI. A complete review of systems otherwise negative.    PHYSICAL EXAM:      Intake/Output Summary (Last 24 hours) at 07/17/2020 0830  Last data filed at 07/17/2020 0351  Gross per 24 hour   Intake 1030 ml   Output 575 ml   Net 455 ml       Recent Vitals:  Vitals:    07/17/20 0800   BP:    Pulse:    Resp:    Temp: 37 ??C   SpO2:        GEN: Lying in bed, chest physiotherapy in progress without lap belt or wrist restaints  HEENT: Extra-ocular movements grossly intact. Moist mucous membranes.  CV:  Auscultation deferred due to ongoing chest physiotherapy. Cap refill < 2 seconds bilaterally. Radially pulses 2+ bilaterally. Palpable distal pulses. Minimal LE edema bilaterally.  Pulm: Auscultation deferred due to ongoing chest physiotherapy.  Abd: Flat and soft.  Nontender. No guarding, rebound.  Normoactive bowel sounds.    GU: Condom catheter in place.  Neuro: Grossly oriented. Moving all 4 extremities spontaneously.  Skin: No rashes or skin lesions.       LABS/ STUDIES:    All imaging, laboratory studies, and other pertinent tests including electrocardiography within the last 24 hours were reviewed and are summarized within the assessment and plan.     NUTRITION:  - Level 6 solids with thin liquids, supplementation with Ensure  - Advance per SLP recs    Willis Modena, MD PGY1

## 2020-07-17 NOTE — Unmapped (Signed)
Pt stable t/o night. Chest vest done with pt and acapella afterward with successful strong productive cough noted.

## 2020-07-18 LAB — CBC
HEMATOCRIT: 38 % — ABNORMAL LOW (ref 39.0–48.0)
HEMOGLOBIN: 13.2 g/dL (ref 12.9–16.5)
MEAN CORPUSCULAR HEMOGLOBIN CONC: 34.8 g/dL (ref 32.0–36.0)
MEAN CORPUSCULAR HEMOGLOBIN: 35.7 pg — ABNORMAL HIGH (ref 25.9–32.4)
MEAN CORPUSCULAR VOLUME: 102.8 fL — ABNORMAL HIGH (ref 77.6–95.7)
MEAN PLATELET VOLUME: 9 fL (ref 6.8–10.7)
PLATELET COUNT: 205 10*9/L (ref 150–450)
RED BLOOD CELL COUNT: 3.7 10*12/L — ABNORMAL LOW (ref 4.26–5.60)
RED CELL DISTRIBUTION WIDTH: 15.3 % — ABNORMAL HIGH (ref 12.2–15.2)
WBC ADJUSTED: 5.4 10*9/L (ref 3.6–11.2)

## 2020-07-18 LAB — BASIC METABOLIC PANEL
ANION GAP: 8 mmol/L (ref 5–14)
BLOOD UREA NITROGEN: 8 mg/dL — ABNORMAL LOW (ref 9–23)
BUN / CREAT RATIO: 15
CALCIUM: 8.3 mg/dL — ABNORMAL LOW (ref 8.7–10.4)
CHLORIDE: 98 mmol/L (ref 98–107)
CO2: 23.8 mmol/L (ref 20.0–31.0)
CREATININE: 0.52 mg/dL — ABNORMAL LOW
EGFR CKD-EPI (2021) MALE: >90 mL/min/{1.73_m2} (ref >=60)
GLUCOSE RANDOM: 109 mg/dL (ref 70–179)
POTASSIUM: 3.8 mmol/L (ref 3.4–4.8)
SODIUM: 130 mmol/L — ABNORMAL LOW (ref 135–145)

## 2020-07-18 LAB — MAGNESIUM: MAGNESIUM: 1.8 mg/dL (ref 1.6–2.6)

## 2020-07-18 MED ADMIN — thiamine mononitrate (vit B1) tablet 100 mg: 100 mg | ORAL | @ 13:00:00

## 2020-07-18 MED ADMIN — loperamide (IMODIUM) capsule 4 mg: 4 mg | ORAL | @ 21:00:00

## 2020-07-18 MED ADMIN — OLANZapine (ZyPREXA) tablet 20 mg: 20 mg | ORAL | @ 01:00:00

## 2020-07-18 MED ADMIN — magnesium oxide (MAG-OX) tablet 800 mg: 800 mg | ORAL | @ 10:00:00 | Stop: 2020-07-18

## 2020-07-18 MED ADMIN — gabapentin (NEURONTIN) capsule 200 mg: 200 mg | ORAL | @ 13:00:00 | Stop: 2020-07-18

## 2020-07-18 MED ADMIN — gabapentin (NEURONTIN) capsule 200 mg: 200 mg | ORAL | @ 01:00:00

## 2020-07-18 MED ADMIN — divalproex ER (DEPAKOTE ER) extended release 24 hr tablet 750 mg: 750 mg | ORAL | @ 01:00:00

## 2020-07-18 MED ADMIN — sodium chloride tablet 1 g: 1 g | ORAL | @ 13:00:00

## 2020-07-18 MED ADMIN — magnesium sulfate 2gm/50mL IVPB: 2 g | INTRAVENOUS | @ 15:00:00 | Stop: 2020-07-18

## 2020-07-18 MED ADMIN — folic acid (FOLVITE) tablet 1 mg: 1 mg | ORAL | @ 13:00:00

## 2020-07-18 MED ADMIN — loperamide (IMODIUM) capsule 4 mg: 4 mg | ORAL | @ 14:00:00

## 2020-07-18 MED ADMIN — guaiFENesin (ROBITUSSIN) oral syrup: 200 mg | ORAL | @ 14:00:00

## 2020-07-18 MED ADMIN — guaiFENesin (ROBITUSSIN) oral syrup: 200 mg | ORAL | @ 05:00:00

## 2020-07-18 MED ADMIN — loperamide (IMODIUM) capsule 2 mg: 2 mg | ORAL | @ 10:00:00 | Stop: 2020-07-18

## 2020-07-18 MED ADMIN — nicotine (NICODERM CQ) 21 mg/24 hr patch 2 patch: 2 | TRANSDERMAL | @ 13:00:00

## 2020-07-18 MED ADMIN — enoxaparin (LOVENOX) syringe 40 mg: 40 mg | SUBCUTANEOUS | @ 13:00:00

## 2020-07-18 MED ADMIN — sodium chloride tablet 1 g: 1 g | ORAL | @ 20:00:00

## 2020-07-18 MED ADMIN — dilTIAZem (CARDIZEM CD) 24 hr capsule 360 mg: 360 mg | ORAL | @ 13:00:00

## 2020-07-18 MED ADMIN — guaiFENesin (ROBITUSSIN) oral syrup: 200 mg | ORAL | @ 10:00:00

## 2020-07-18 MED ADMIN — potassium chloride (KLOR-CON) packet 40 mEq: 40 meq | ORAL | @ 01:00:00 | Stop: 2020-07-17

## 2020-07-18 MED ADMIN — sodium chloride tablet 1 g: 1 g | ORAL | @ 16:00:00

## 2020-07-18 MED ADMIN — guaiFENesin (ROBITUSSIN) oral syrup: 200 mg | ORAL | @ 21:00:00

## 2020-07-18 MED ADMIN — naltrexone (DEPADE) tablet 50 mg: 50 mg | ORAL | @ 13:00:00

## 2020-07-18 NOTE — Unmapped (Signed)
Problem: Adult Inpatient Plan of Care  Goal: Plan of Care Review  Outcome: Progressing  Goal: Patient-Specific Goal (Individualized)  Outcome: Progressing  Goal: Absence of Hospital-Acquired Illness or Injury  Outcome: Progressing  Intervention: Identify and Manage Fall Risk  Recent Flowsheet Documentation  Taken 07/17/2020 2000 by [name] Nottingham, RN  Safety Interventions:   bed alarm   fall reduction program maintained   infection management   lighting adjusted for tasks/safety   low bed   nonskid shoes/slippers when out of bed   room near unit station  Intervention: Prevent Skin Injury  Recent Flowsheet Documentation  Taken 07/17/2020 2000 by [name] Nottingham, RN  Skin Protection:   incontinence pads utilized   cleansing with dimethicone incontinence wipes   adhesive use limited   transparent dressing maintained  Intervention: Prevent and Manage VTE (Venous Thromboembolism) Risk  Recent Flowsheet Documentation  Taken 07/17/2020 2000 by [name] Nottingham, RN  Activity Management: bedrest  Intervention: Prevent Infection  Recent Flowsheet Documentation  Taken 07/17/2020 2000 by [name] Nottingham, RN  Infection Prevention:   equipment surfaces disinfected   environmental surveillance performed   hand hygiene promoted   personal protective equipment utilized   rest/sleep promoted   single patient room provided  Goal: Optimal Comfort and Wellbeing  Outcome: Progressing  Goal: Readiness for Transition of Care  Outcome: Progressing  Goal: Rounds/Family Conference  Outcome: Progressing     Problem: Infection  Goal: Absence of Infection Signs and Symptoms  Outcome: Progressing  Intervention: Prevent or Manage Infection  Recent Flowsheet Documentation  Taken 07/17/2020 2000 by [name] Nottingham, RN  Infection Management: aseptic technique maintained     Problem: Skin Injury Risk Increased  Goal: Skin Health and Integrity  Outcome: Progressing  Intervention: Optimize Skin Protection  Recent Flowsheet Documentation  Taken 07/18/2020 0200 by [name] Nottingham, RN  Pressure Reduction Techniques: frequent weight shift encouraged  Taken 07/18/2020 0000 by [name] Nottingham, RN  Pressure Reduction Techniques: frequent weight shift encouraged  Taken 07/17/2020 2200 by [name] Nottingham, RN  Pressure Reduction Techniques: frequent weight shift encouraged  Taken 07/17/2020 2000 by [name] Nottingham, RN  Pressure Reduction Techniques: frequent weight shift encouraged  Head of Bed (HOB) Positioning: HOB at 30-45 degrees  Pressure Reduction Devices:   positioning supports utilized   pressure-redistributing mattress utilized  Skin Protection:   incontinence pads utilized   cleansing with dimethicone incontinence wipes   adhesive use limited   transparent dressing maintained     Problem: Fall Injury Risk  Goal: Absence of Fall and Fall-Related Injury  Outcome: Progressing  Intervention: Promote Scientist, clinical (histocompatibility and immunogenetics) Documentation  Taken 07/17/2020 2000 by [name] Nottingham, RN  Safety Interventions:   bed alarm   fall reduction program maintained   infection management   lighting adjusted for tasks/safety   low bed   nonskid shoes/slippers when out of bed   room near unit station     Problem: Self-Care Deficit  Goal: Improved Ability to Complete Activities of Daily Living  Outcome: Progressing

## 2020-07-18 NOTE — Unmapped (Addendum)
Patient remains stepdown status due to a-fib with poor rate control and prior diltiazem drip.     Neuro: Drowsy but oriented, continues to have poor safety awareness and seems to be forgetful at times.   CV: Remains in a-fib with rates in the 100s,120-130s with exertion. Dilt drip was stopped at 1000 and was transitioned to oral diltiazem. BP is normotensive.   Resp: Maintaining saturations in the low-mid 90's on room air, continues to need 2L O2 while asleep.   GI/GU: Has temperature foley. Producing adequate amounts of urine. Continues to have 3-4 watery bowel movements per day and is incontinent of stool. Appetite is poor.     Plan: wean O2 as able, attain better rate control, remove foley, and disposition planning as needed.     Problem: Infection  Goal: Absence of Infection Signs and Symptoms  Outcome: Ongoing - Unchanged  Intervention: Prevent or Manage Infection  Recent Flowsheet Documentation  Taken 07/17/2020 0800 by Johnsie Cancel, RN  Infection Management: aseptic technique maintained     Problem: Adult Inpatient Plan of Care  Goal: Plan of Care Review  Outcome: Progressing  Goal: Patient-Specific Goal (Individualized)  Outcome: Progressing  Goal: Absence of Hospital-Acquired Illness or Injury  Outcome: Progressing  Intervention: Prevent Skin Injury  Recent Flowsheet Documentation  Taken 07/17/2020 1000 by Johnsie Cancel, RN  Skin Protection:  ??? incontinence pads utilized  ??? adhesive use limited  ??? tubing/devices free from skin contact  Taken 07/17/2020 0800 by Johnsie Cancel, RN  Skin Protection:  ??? incontinence pads utilized  ??? adhesive use limited  ??? tubing/devices free from skin contact  ??? cleansing with dimethicone incontinence wipes  Intervention: Prevent and Manage VTE (Venous Thromboembolism) Risk  Recent Flowsheet Documentation  Taken 07/17/2020 0800 by Johnsie Cancel, RN  VTE Prevention/Management: anticoagulant therapy  Intervention: Prevent Infection  Recent Flowsheet Documentation  Taken 07/17/2020 0800 by Johnsie Cancel, RN  Infection Prevention:  ??? equipment surfaces disinfected  ??? hand hygiene promoted  ??? personal protective equipment utilized  ??? rest/sleep promoted  ??? single patient room provided  Goal: Optimal Comfort and Wellbeing  Outcome: Progressing  Goal: Readiness for Transition of Care  Outcome: Progressing  Goal: Rounds/Family Conference  Outcome: Progressing     Problem: Skin Injury Risk Increased  Goal: Skin Health and Integrity  Outcome: Progressing  Intervention: Optimize Skin Protection  Recent Flowsheet Documentation  Taken 07/17/2020 1200 by Johnsie Cancel, RN  Head of Bed [facility]) Positioning: HOB at 30-45 degrees  Taken 07/17/2020 1000 by Johnsie Cancel, RN  Pressure Reduction Techniques:  ??? frequent weight shift encouraged  ??? weight shift assistance provided  ??? heels elevated off bed  Head of Bed (HOB) Positioning: HOB at 60 degrees  Pressure Reduction Devices: positioning supports utilized  Skin Protection:  ??? incontinence pads utilized  ??? adhesive use limited  ??? tubing/devices free from skin contact  Taken 07/17/2020 0800 by Johnsie Cancel, RN  Pressure Reduction Techniques:  ??? frequent weight shift encouraged  ??? weight shift assistance provided  ??? heels elevated off bed  Head of Bed (HOB) Positioning: HOB at 30-45 degrees  Pressure Reduction Devices:  ??? positioning supports utilized  ??? pressure-redistributing mattress utilized  Skin Protection:  ??? incontinence pads utilized  ??? adhesive use limited  ??? tubing/devices free from skin contact  ??? cleansing with dimethicone incontinence wipes     Problem: Fall Injury Risk  Goal: Absence of Fall and Fall-Related Injury  Outcome:  Progressing  Intervention: Identify and Manage Contributors  Recent Flowsheet Documentation  Taken 07/17/2020 1200 by Johnsie Cancel, RN  Self-Care Promotion: independence encouraged  Taken 07/17/2020 0800 by Johnsie Cancel, RN  Self-Care Promotion: independence encouraged Problem: Self-Care Deficit  Goal: Improved Ability to Complete Activities of Daily Living  Outcome: Progressing  Intervention: Promote Activity and Functional Independence  Recent Flowsheet Documentation  Taken 07/17/2020 1200 by Johnsie Cancel, RN  Self-Care Promotion: independence encouraged  Taken 07/17/2020 0800 by Johnsie Cancel, RN  Self-Care Promotion: independence encouraged

## 2020-07-18 NOTE — Unmapped (Cosign Needed)
Family Medicine Inpatient Service    Progress Note    Team: [facility] (pgr [phone])    Hospital Day: 10    ASSESSMENT / PLAN:   Nicholas Rush is a 55 y.o. male with a past medical history significant for HTN, bipolar I??disorder, alcohol use??disorder c/b withdrawal seizures,??recurrent??C??diff??infection, who presents s/p witnessed grand mal seizure for monitored alcohol withdrawal.     ## Alcohol Use Disorder - hx complicated W/d  Last EtOH > 1 week ago. Overall much improved; He is afebrile, grossly oriented, and tolerating PO. CIWA scores consistently < 4, protocol and prn ativan discontinued on 6/27 without complication.  - PO thiamine and folate daily  - zofran PRN  - melatonin at bedtime, delirium precautions  - INCREASE gabapentin from 200mg  to 300 mg BID  - Naltrexone 50 mg daily    ## Concern for Aspiration Pneumonia - Cough  Patient's presentation less concerning for infection with no fever, no sign of consolidation on lung exam, but patient did have multiple aspiration events early in his course. S/p Augmentin 875 mg BID x 5 days.  - Prn Robitussin syrup  - Incentive spirometry, chest physiotherapy  - Regular diet per SLP recs    ## Paroxysmal Afib  Episode of HR in the 140s overnight, which responded to Mg2+ and Diltiazem drip. Now, predominantly rate controlled with Mg2+ of 1.8 and K+ of 3.3; remains in Afib. Transitioned back to home oral diltiazem from drip.  - diltiazem 360 mg daily  - IV Mg2+ 2g given with goal K >4, Mg >2   - DECREASE Mag-Ox 800 mg TID to daily due to ongoing loose stools  - Loperamide 4 mg given     ## Myotonic Clonic Seizure   Most likely provoked seizure 2/2 alcohol or infection. Spot EEG during recent hospitalization not suggestive of underlying pathology. Neurology suggests most consistent with provoked seizure and recommends titration of depakote to therapeutic level.     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 Patient had psych admission where they attempted to switch to lithium, but patient did not tolerate. He has not been stable on antipsychotic monotherapy and has had reasonable stability with subtherapeutic dose of VPA.   - 1 g salt tab TID  - daily bmp  ????  ## Bipolar I Disorder: well-controlled on Depakote and Zyprexa. VPA subtherapeutic, but will not increase due to concern for SIADH and decreasing sodium level.   - continue zyprexa 20mg  at bedtime    ## Tobacco use disorder: Patient currently smoking 2.5 PPD. SW provided education and option for outpatient follow up for tobacco cessation on 6/21.  - Nicotine replacement therapy with patch 42 mg daily, prn gum  ??  # Checklist:  - IVF: None  - Tubes/Lines/Drains: PIV, discontinued Foley   - Diet: Regular diet, supplementation with Ensure  - Bowel Regimen: prn Loperamide, prn Miralax  - DVT: SQ Lovenox  - Code Status:   Orders Placed This Encounter   Procedures   ??? Full Code     Standing Status:   Standing     Number of Occurrences:   1     - Dispo: Floor    [ ]  Anticipated Discharge Location: tbd  [ ]  PT/OT/DME: 5xL  [ ]  CM/SW needs: pursuing SNF versus home with PT, Substance use resources  [ ]  Meds/Rx:  Not yet prescribed. No special med needs  [ ]  Teaching: None anticipated  [ ]   Follow up appt: Appointment needed  [ ]  Excuse letter: None anticipated  [ ]  Transport: Private Needed    SUBJECTIVE:   - Pt without any complaints  - RN: loperamide given for loose stools  - Spouse: pt is disoriented in late afternoon/nights, has concerns about pt being discharged due to diarrhea and afib    REVIEW OF SYSTEMS:  Pertinent positives and negatives per HPI. A complete review of systems otherwise negative.    PHYSICAL EXAM:      Intake/Output Summary (Last 24 hours) at 07/18/2020 1336  Last data filed at 07/18/2020 1200  Gross per 24 hour   Intake 1367 ml   Output 730 ml   Net 637 ml       Recent Vitals:  Vitals:    07/18/20 1200   BP: 144/93   Pulse: 82   Resp: 17 Temp: 36.6 ??C   SpO2: 94%       GEN: Lying in bed, awake  HEENT: Extra-ocular movements grossly intact. Moist mucous membranes.  CV:  Irregularly irregular, normal rate. Cap refill < 2 seconds bilaterally. Radially pulses 2+ bilaterally. Palpable distal pulses. Minimal LE edema bilaterally.  Pulm: Clear to auscultation bilaterally. No wheezing, crackles, or rhonchi.  Abd: Flat and soft.  Nontender. No guarding, rebound.  Normoactive bowel sounds.    Neuro: Oriented x4. Moving all 4 extremities spontaneously.  Skin: No rashes or skin lesions.       LABS/ STUDIES:    All imaging, laboratory studies, and other pertinent tests including electrocardiography within the last 24 hours were reviewed and are summarized within the assessment and plan.     NUTRITION:  - Advanced to regular diet per SLP recs    Willis Modena, MD PGY1

## 2020-07-18 NOTE — Unmapped (Signed)
Airway clearance performed with no adverse reactions. Pt has good strong cough noted. No acute events t/o night.

## 2020-07-18 NOTE — Unmapped (Signed)
Pt now floor status. Remains hospitalized pending disposition planning.     Neuro: improved. Alert, disoriented to time. Tremulous arms. Generally weak.   CV: now in NSR. Oral dilt for rate control. Normotensive.  Resp: room air when awake, 2-3L when sleeping.  Gi/gu: voiding, external catheter in place. Adequate urine output. Diarrhea has improved after immodium. PO intake continues to be poor.  Skin: generally intact.    Plan: dc to home pending discussions with family, arrangement of services as needed.     Problem: Adult Inpatient Plan of Care  Goal: Plan of Care Review  Outcome: Progressing  Goal: Patient-Specific Goal (Individualized)  Outcome: Progressing  Goal: Absence of Hospital-Acquired Illness or Injury  Outcome: Progressing  Intervention: Identify and Manage Fall Risk  Recent Flowsheet Documentation  Taken 07/18/2020 0800 by Sherrian Divers, RN  Safety Interventions:   aspiration precautions   bleeding precautions   commode/urinal/bedpan at bedside   fall reduction program maintained   lighting adjusted for tasks/safety   nonskid shoes/slippers when out of bed  Intervention: Prevent Skin Injury  Recent Flowsheet Documentation  Taken 07/18/2020 0800 by Sherrian Divers, RN  Skin Protection:   cleansing with dimethicone incontinence wipes   incontinence pads utilized   tubing/devices free from skin contact   zinc oxide barrier cream  Intervention: Prevent Infection  Recent Flowsheet Documentation  Taken 07/18/2020 0800 by Sherrian Divers, RN  Infection Prevention:   visitors restricted/screened   single patient room provided   rest/sleep promoted   personal protective equipment utilized   hand hygiene promoted   equipment surfaces disinfected  Goal: Optimal Comfort and Wellbeing  Outcome: Progressing  Goal: Readiness for Transition of Care  Outcome: Ongoing - Unchanged  Goal: Rounds/Family Conference  Outcome: Progressing     Problem: Infection  Goal: Absence of Infection Signs and Symptoms  Outcome: Resolved  Intervention: Prevent or Manage Infection  Recent Flowsheet Documentation  Taken 07/18/2020 0800 by Sherrian Divers, RN  Infection Management: aseptic technique maintained     Problem: Skin Injury Risk Increased  Goal: Skin Health and Integrity  Outcome: Ongoing - Unchanged  Intervention: Optimize Skin Protection  Recent Flowsheet Documentation  Taken 07/18/2020 0800 by Sherrian Divers, RN  Pressure Reduction Techniques:   frequent weight shift encouraged   heels elevated off bed   weight shift assistance provided  Head of Bed (HOB) Positioning: HOB at 30 degrees  Pressure Reduction Devices:   pressure-redistributing mattress utilized   positioning supports utilized  Skin Protection:   cleansing with dimethicone incontinence wipes   incontinence pads utilized   tubing/devices free from skin contact   zinc oxide barrier cream     Problem: Fall Injury Risk  Goal: Absence of Fall and Fall-Related Injury  Outcome: Ongoing - Unchanged  Intervention: Promote Scientist, clinical (histocompatibility and immunogenetics) Documentation  Taken 07/18/2020 0800 by Sherrian Divers, RN  Safety Interventions:   aspiration precautions   bleeding precautions   commode/urinal/bedpan at bedside   fall reduction program maintained   lighting adjusted for tasks/safety   nonskid shoes/slippers when out of bed     Problem: Self-Care Deficit  Goal: Improved Ability to Complete Activities of Daily Living  Outcome: Ongoing - Unchanged

## 2020-07-18 NOTE — Unmapped (Signed)
Patient called stating that his Home Health nurse needs a new order for them to come out to his home due to his recent visit to the hospital.

## 2020-07-18 NOTE — Unmapped (Signed)
Rested on room air. Chest vest done for airway clearance

## 2020-07-19 LAB — CBC
HEMATOCRIT: 36.4 % — ABNORMAL LOW (ref 39.0–48.0)
HEMOGLOBIN: 12.8 g/dL — ABNORMAL LOW (ref 12.9–16.5)
MEAN CORPUSCULAR HEMOGLOBIN CONC: 35.2 g/dL (ref 32.0–36.0)
MEAN CORPUSCULAR HEMOGLOBIN: 36.1 pg — ABNORMAL HIGH (ref 25.9–32.4)
MEAN CORPUSCULAR VOLUME: 102.3 fL — ABNORMAL HIGH (ref 77.6–95.7)
MEAN PLATELET VOLUME: 8.7 fL (ref 6.8–10.7)
PLATELET COUNT: 261 10*9/L (ref 150–450)
RED BLOOD CELL COUNT: 3.56 10*12/L — ABNORMAL LOW (ref 4.26–5.60)
RED CELL DISTRIBUTION WIDTH: 15 % (ref 12.2–15.2)
WBC ADJUSTED: 5.5 10*9/L (ref 3.6–11.2)

## 2020-07-19 LAB — BASIC METABOLIC PANEL
ANION GAP: 9 mmol/L (ref 5–14)
BLOOD UREA NITROGEN: 7 mg/dL — ABNORMAL LOW (ref 9–23)
BUN / CREAT RATIO: 12
CALCIUM: 8.3 mg/dL — ABNORMAL LOW (ref 8.7–10.4)
CHLORIDE: 100 mmol/L (ref 98–107)
CO2: 23.9 mmol/L (ref 20.0–31.0)
CREATININE: 0.58 mg/dL — ABNORMAL LOW
EGFR CKD-EPI (2021) MALE: >90 mL/min/{1.73_m2} (ref >=60)
GLUCOSE RANDOM: 99 mg/dL (ref 70–179)
POTASSIUM: 3.7 mmol/L (ref 3.4–4.8)
SODIUM: 133 mmol/L — ABNORMAL LOW (ref 135–145)

## 2020-07-19 LAB — MAGNESIUM: MAGNESIUM: 1.7 mg/dL (ref 1.6–2.6)

## 2020-07-19 MED ADMIN — loperamide (IMODIUM) capsule 4 mg: 4 mg | ORAL | @ 22:00:00

## 2020-07-19 MED ADMIN — magnesium sulfate 2gm/50mL IVPB: 2 g | INTRAVENOUS | @ 16:00:00 | Stop: 2020-07-19

## 2020-07-19 MED ADMIN — OLANZapine (ZyPREXA) tablet 20 mg: 20 mg | ORAL | @ 02:00:00

## 2020-07-19 MED ADMIN — enoxaparin (LOVENOX) syringe 40 mg: 40 mg | SUBCUTANEOUS | @ 12:00:00

## 2020-07-19 MED ADMIN — folic acid (FOLVITE) tablet 1 mg: 1 mg | ORAL | @ 12:00:00

## 2020-07-19 MED ADMIN — divalproex ER (DEPAKOTE ER) extended release 24 hr tablet 750 mg: 750 mg | ORAL | @ 02:00:00

## 2020-07-19 MED ADMIN — magnesium oxide (MAG-OX) tablet 800 mg: 800 mg | ORAL | @ 12:00:00

## 2020-07-19 MED ADMIN — loperamide (IMODIUM) capsule 4 mg: 4 mg | ORAL | @ 02:00:00

## 2020-07-19 MED ADMIN — guaiFENesin (ROBITUSSIN) oral syrup: 200 mg | ORAL | @ 16:00:00

## 2020-07-19 MED ADMIN — gabapentin (NEURONTIN) capsule 300 mg: 300 mg | ORAL | @ 12:00:00

## 2020-07-19 MED ADMIN — melatonin tablet 3 mg: 3 mg | ORAL | @ 02:00:00

## 2020-07-19 MED ADMIN — magnesium sulfate 2gm/50mL IVPB: 2 g | INTRAVENOUS | @ 12:00:00 | Stop: 2020-07-19

## 2020-07-19 MED ADMIN — sodium chloride tablet 1 g: 1 g | ORAL | @ 12:00:00

## 2020-07-19 MED ADMIN — guaiFENesin (ROBITUSSIN) oral syrup: 200 mg | ORAL | @ 04:00:00

## 2020-07-19 MED ADMIN — guaiFENesin (ROBITUSSIN) oral syrup: 200 mg | ORAL | @ 02:00:00

## 2020-07-19 MED ADMIN — thiamine mononitrate (vit B1) tablet 100 mg: 100 mg | ORAL | @ 12:00:00

## 2020-07-19 MED ADMIN — gabapentin (NEURONTIN) capsule 300 mg: 300 mg | ORAL | @ 02:00:00

## 2020-07-19 MED ADMIN — loperamide (IMODIUM) capsule 4 mg: 4 mg | ORAL | @ 16:00:00

## 2020-07-19 MED ADMIN — dilTIAZem (CARDIZEM CD) 24 hr capsule 360 mg: 360 mg | ORAL | @ 12:00:00

## 2020-07-19 MED ADMIN — naltrexone (DEPADE) tablet 50 mg: 50 mg | ORAL | @ 12:00:00

## 2020-07-19 MED ADMIN — nicotine (NICODERM CQ) 21 mg/24 hr patch 2 patch: 2 | TRANSDERMAL | @ 12:00:00

## 2020-07-19 MED ADMIN — sodium chloride tablet 1 g: 1 g | ORAL | @ 17:00:00

## 2020-07-19 MED ADMIN — sodium chloride tablet 1 g: 1 g | ORAL | @ 21:00:00

## 2020-07-19 MED ADMIN — potassium chloride (KLOR-CON) packet 30 mEq: 30 meq | ORAL | @ 18:00:00

## 2020-07-19 MED ADMIN — potassium chloride (KLOR-CON) packet 30 mEq: 30 meq | ORAL | @ 12:00:00

## 2020-07-19 NOTE — Unmapped (Signed)
Acute Inpatient Rehab referral has been received and patient is currently under review.       Please be advised, [facility] is now located at the [facility].      Visitation Policy:  [facility] allows visitors (6 AM- 9 PM) and one visitor may be designated to stay overnight. The overnight visitor must be in the facility prior to 9 PM. No visitors under the age of 27 will be allowed to visit for the foreseeable future.      Please call admissions office with any pertinent updates. Thank you.      Myriam Forehand, [facility]  [facility]  Inpatient Coordinator  [phone]    1:07 PM 07/19/2020

## 2020-07-19 NOTE — Unmapped (Cosign Needed)
Family Medicine Inpatient Service    Progress Note    Team: Family Medicine Blue (pgr [phone])    Hospital Day: 11    ASSESSMENT / PLAN:   Nicholas Rush is a 55 y.o. male with a past medical history significant for HTN, bipolar I??disorder, alcohol use??disorder c/b withdrawal seizures,??recurrent??C??diff??infection, who presents s/p witnessed grand mal seizure for monitored alcohol withdrawal.     ## Alcohol Use Disorder - hx complicated W/d  Last EtOH > 1 week ago. Overall much improved; He is afebrile, grossly oriented, and tolerating PO. CIWA scores consistently < 4, protocol and prn ativan discontinued on 6/27 without complication. Restarted naltrexone given historical positive response. Coordinated care with PCP, who states pt had decreased alcohol use on higher dose of gabapentin. Increased gabapentin from 200 mg BID to 300 mg BID and will discharge on vitamin supplementation.  - PO thiamine and folate daily  - melatonin at bedtime, delirium precautions  - gabapentin from 300 mg BID  - naltrexone 50 mg daily    ## Concern for Aspiration Pneumonia - Cough  Patient's presentation less concerning for infection with no fever, no sign of consolidation on lung exam, but patient did have multiple aspiration events early in his course. S/p Augmentin 875 mg BID x 5 days.  - Prn Robitussin syrup  - Incentive spirometry, chest physiotherapy  - Regular diet per SLP recs    ## Paroxysmal Afib  Previously required Mg2+ and Diltiazem drip. Now, predominantly rate controlled with Mg2+ of 1.7 and K+ of 3.7; remains in Afib. Transitioned back to home oral diltiazem from drip.  - diltiazem 360 mg daily  - IV Mg2+ 4g and po K+ 60 mEq given with goal K >4, Mg >2   - Mag-Ox 800 mg daily, diarrhea with prior TID dosing  - prn Loperamide     # Deconditioning:  Likely combination of sedentary lifestyle, frequent inpatient hospitalizations for delirium and limited ability to meaningfully participate with PO/OT, and heavy alcohol use. PT/OT will continue to meet with patient while inpatient while AIR is pursued.   - PT/OT: 5xH  - AIR referral placed on 7/1    ## Myotonic Clonic Seizure   Most likely provoked seizure 2/2 alcohol or infection. Spot EEG during recent hospitalization not suggestive of underlying pathology. Neurology suggests most consistent with provoked seizure and recommends titration of depakote to therapeutic level.     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 Patient had psych admission where they attempted to switch to lithium, but patient did not tolerate. He has not been stable on antipsychotic monotherapy and has had reasonable stability with subtherapeutic dose of VPA.   - 1 g salt tab TID  - daily bmp  ????  ## Bipolar I Disorder: well-controlled on Depakote and Zyprexa. VPA subtherapeutic, but will not increase due to concern for SIADH and decreasing sodium level.   - continue zyprexa 20mg  at bedtime    ## Tobacco use disorder: Patient currently smoking 2.5 PPD. SW provided education and option for outpatient follow up for tobacco cessation on 6/21.  - Nicotine replacement therapy with patch 42 mg daily, prn gum  ??  # Checklist:  - IVF: None  - Tubes/Lines/Drains: PIV  - Diet: Regular diet, supplementation with Ensure  - Bowel Regimen: prn Loperamide, prn Miralax  - DVT: SQ Lovenox  - Code Status:   Orders Placed This Encounter   Procedures   ??? Full Code  Standing Status:   Standing     Number of Occurrences:   1     - Dispo: Floor    [ ]  Anticipated Discharge Location: tbd  [ ]  PT/OT/DME: 5xH  [ ]  CM/SW needs: pursuing AIR, Substance use resources  [ ]  Meds/Rx:  Not yet prescribed. No special med needs  [ ]  Teaching: None anticipated  [ ]  Follow up appt: Appointment needed  [ ]  Excuse letter: None anticipated  [ ]  Transport: Private Needed    SUBJECTIVE:   - Pt states he does not believe he is ready for discharge  - RN: chest vest completed by RT for airway clearance    REVIEW OF SYSTEMS:  Pertinent positives and negatives per HPI. A complete review of systems otherwise negative.    PHYSICAL EXAM:      Intake/Output Summary (Last 24 hours) at 07/19/2020 1357  Last data filed at 07/19/2020 0600  Gross per 24 hour   Intake 960 ml   Output 750 ml   Net 210 ml       Recent Vitals:  Vitals:    07/19/20 0800   BP:    Pulse:    Resp:    Temp: 36.7 ??C   SpO2:        GEN: Lying in bed, awake  HEENT: Extra-ocular movements grossly intact. Moist mucous membranes.  CV: Auscultation deferred due to pt receiving chest physiotherapy. Cap refill < 2 seconds bilaterally. Radially pulses 2+ bilaterally. Palpable distal pulses. Minimal LE edema bilaterally.  Pulm: Normal work of breathing. Auscultation deferred due to pt receiving chest physiotherapy.  Abd: Flat and soft.  Nontender. No guarding, rebound.  Normoactive bowel sounds.    Neuro: Oriented x4. Moving all 4 extremities spontaneously.  Skin: No rashes or skin lesions.       LABS/ STUDIES:    All imaging, laboratory studies, and other pertinent tests including electrocardiography within the last 24 hours were reviewed and are summarized within the assessment and plan.     NUTRITION:  - Regular diet per SLP recs    Willis Modena, MD PGY1

## 2020-07-19 NOTE — Unmapped (Signed)
Rested on room air. Chest vest performed for airway clearance with no adverse reactions noted.

## 2020-07-19 NOTE — Unmapped (Signed)
Problem: Adult Inpatient Plan of Care  Goal: Plan of Care Review  Outcome: Progressing  Goal: Patient-Specific Goal (Individualized)  Outcome: Progressing  Goal: Absence of Hospital-Acquired Illness or Injury  Outcome: Progressing  Intervention: Identify and Manage Fall Risk  Recent Flowsheet Documentation  Taken 07/19/2020 0000 by Nestor Lewandowsky, RN  Safety Interventions:   bed alarm   fall reduction program maintained   low bed   nonskid shoes/slippers when out of bed  Taken 07/18/2020 2200 by Nestor Lewandowsky, RN  Safety Interventions:   bed alarm   fall reduction program maintained   low bed   nonskid shoes/slippers when out of bed  Taken 07/18/2020 2000 by Nestor Lewandowsky, RN  Safety Interventions:   bed alarm   fall reduction program maintained   low bed   nonskid shoes/slippers when out of bed  Goal: Optimal Comfort and Wellbeing  Outcome: Progressing  Goal: Readiness for Transition of Care  Outcome: Progressing  Goal: Rounds/Family Conference  Outcome: Progressing     Problem: Skin Injury Risk Increased  Goal: Skin Health and Integrity  Outcome: Progressing  Intervention: Optimize Skin Protection  Recent Flowsheet Documentation  Taken 07/19/2020 0000 by Nestor Lewandowsky, RN  Pressure Reduction Techniques: frequent weight shift encouraged  Pressure Reduction Devices: positioning supports utilized  Taken 07/18/2020 2200 by Nestor Lewandowsky, RN  Pressure Reduction Techniques: frequent weight shift encouraged  Pressure Reduction Devices: positioning supports utilized  Taken 07/18/2020 2000 by Nestor Lewandowsky, RN  Pressure Reduction Techniques: frequent weight shift encouraged  Head of Bed (HOB) Positioning: HOB at 45 degrees  Pressure Reduction Devices: positioning supports utilized     Problem: Fall Injury Risk  Goal: Absence of Fall and Fall-Related Injury  Outcome: Progressing  Intervention: Promote Scientist, clinical (histocompatibility and immunogenetics) Documentation  Taken 07/19/2020 0000 by Nestor Lewandowsky, RN  Safety Interventions:   bed alarm   fall reduction program maintained   low bed   nonskid shoes/slippers when out of bed  Taken 07/18/2020 2200 by Nestor Lewandowsky, RN  Safety Interventions:   bed alarm   fall reduction program maintained   low bed   nonskid shoes/slippers when out of bed  Taken 07/18/2020 2000 by Nestor Lewandowsky, RN  Safety Interventions:   bed alarm   fall reduction program maintained   low bed   nonskid shoes/slippers when out of bed     Problem: Self-Care Deficit  Goal: Improved Ability to Complete Activities of Daily Living  Outcome: Progressing

## 2020-07-19 NOTE — Unmapped (Signed)
Call [facility] to reschedule assessment for personal care services  Contact for [facility] is [phone].

## 2020-07-19 NOTE — Unmapped (Signed)
The patient's respiratory status this shift has been stable .     The patient is currently on nasal cannula.    Lung sounds are  rhonchi   .    The patients cough has been non-productive.    Additional interventions will occur as needed.     Airway clearance by vest wrap tolerated well.

## 2020-07-20 ENCOUNTER — Ambulatory Visit
Admission: TF | Admit: 2020-07-20 | Discharge: 2020-07-31 | Disposition: A | Discharge status: Home / Self Care | Payer: MEDICAID | Source: Intra-hospital | Admitting: Physical Medicine & Rehabilitation

## 2020-07-20 LAB — BASIC METABOLIC PANEL
ANION GAP: 9 mmol/L (ref 5–14)
BLOOD UREA NITROGEN: 9 mg/dL (ref 9–23)
BUN / CREAT RATIO: 15
CALCIUM: 8.5 mg/dL — ABNORMAL LOW (ref 8.7–10.4)
CHLORIDE: 100 mmol/L (ref 98–107)
CO2: 23.7 mmol/L (ref 20.0–31.0)
CREATININE: 0.6 mg/dL
EGFR CKD-EPI (2021) MALE: >90 mL/min/{1.73_m2} (ref >=60)
GLUCOSE RANDOM: 95 mg/dL (ref 70–179)
POTASSIUM: 4.6 mmol/L (ref 3.4–4.8)
SODIUM: 133 mmol/L — ABNORMAL LOW (ref 135–145)

## 2020-07-20 LAB — MAGNESIUM: MAGNESIUM: 1.7 mg/dL (ref 1.6–2.6)

## 2020-07-20 MED ORDER — LOPERAMIDE 2 MG CAPSULE
ORAL_CAPSULE | ORAL | 0 refills | 3 days | Status: CN | PRN
Start: 2020-07-20 — End: 2020-08-19

## 2020-07-20 MED ORDER — GABAPENTIN 300 MG CAPSULE
ORAL_CAPSULE | Freq: Two times a day (BID) | ORAL | 0 refills | 30 days | Status: CN
Start: 2020-07-20 — End: 2020-08-19

## 2020-07-20 MED ORDER — POTASSIUM CHLORIDE 20 MEQ ORAL PACKET
PACK | Freq: Two times a day (BID) | ORAL | 11 refills | 30 days | Status: CN
Start: 2020-07-20 — End: 2021-07-20

## 2020-07-20 MED ORDER — GUAIFENESIN 100 MG/5 ML ORAL LIQUID
ORAL | 0 refills | 2.00000 days | Status: CN | PRN
Start: 2020-07-20 — End: ?

## 2020-07-20 MED ORDER — ADALIMUMAB SYRINGE CITRATE FREE 40 MG/0.4 ML
1 refills | 0 days
Start: 2020-07-20 — End: ?

## 2020-07-20 MED ORDER — NICOTINE (POLACRILEX) 2 MG GUM
BUCCAL | 0 refills | 10 days | Status: CN | PRN
Start: 2020-07-20 — End: 2020-08-19

## 2020-07-20 MED ADMIN — OLANZapine (ZyPREXA) tablet 20 mg: 20 mg | ORAL | @ 02:00:00

## 2020-07-20 MED ADMIN — gabapentin (NEURONTIN) capsule 300 mg: 300 mg | ORAL | @ 13:00:00 | Stop: 2020-07-20

## 2020-07-20 MED ADMIN — naltrexone (DEPADE) tablet 50 mg: 50 mg | ORAL | @ 14:00:00 | Stop: 2020-07-20

## 2020-07-20 MED ADMIN — enoxaparin (LOVENOX) syringe 40 mg: 40 mg | SUBCUTANEOUS | @ 13:00:00 | Stop: 2020-07-20

## 2020-07-20 MED ADMIN — divalproex ER (DEPAKOTE ER) extended release 24 hr tablet 750 mg: 750 mg | ORAL | @ 02:00:00

## 2020-07-20 MED ADMIN — folic acid (FOLVITE) tablet 1 mg: 1 mg | ORAL | @ 13:00:00 | Stop: 2020-07-20

## 2020-07-20 MED ADMIN — potassium chloride (KLOR-CON) packet 30 mEq: 30 meq | ORAL | @ 17:00:00 | Stop: 2020-07-20

## 2020-07-20 MED ADMIN — gabapentin (NEURONTIN) capsule 300 mg: 300 mg | ORAL | @ 01:00:00

## 2020-07-20 MED ADMIN — enoxaparin (LOVENOX) syringe 40 mg: 40 mg | SUBCUTANEOUS | @ 20:00:00

## 2020-07-20 MED ADMIN — guaiFENesin (ROBITUSSIN) oral syrup: 200 mg | ORAL | @ 20:00:00

## 2020-07-20 MED ADMIN — magnesium oxide (MAG-OX) tablet 800 mg: 800 mg | ORAL | @ 13:00:00 | Stop: 2020-07-20

## 2020-07-20 MED ADMIN — nicotine (NICODERM CQ) 21 mg/24 hr patch 2 patch: 2 | TRANSDERMAL | @ 17:00:00 | Stop: 2020-07-20

## 2020-07-20 MED ADMIN — thiamine mononitrate (vit B1) tablet 100 mg: 100 mg | ORAL | @ 13:00:00 | Stop: 2020-07-20

## 2020-07-20 MED ADMIN — dilTIAZem (CARDIZEM CD) 24 hr capsule 360 mg: 360 mg | ORAL | @ 13:00:00 | Stop: 2020-07-20

## 2020-07-20 MED ADMIN — sodium chloride tablet 1 g: 1 g | ORAL | @ 13:00:00 | Stop: 2020-07-20

## 2020-07-20 MED ADMIN — potassium chloride (KLOR-CON) packet 30 mEq: 30 meq | ORAL | @ 13:00:00 | Stop: 2020-07-20

## 2020-07-20 MED ADMIN — guaiFENesin (ROBITUSSIN) oral syrup: 200 mg | ORAL | @ 06:00:00 | Stop: 2020-07-20

## 2020-07-20 MED ADMIN — sodium chloride tablet 1 g: 1 g | ORAL | @ 17:00:00 | Stop: 2020-07-20

## 2020-07-20 MED ADMIN — pantoprazole (PROTONIX) EC tablet 40 mg: 40 mg | ORAL

## 2020-07-20 NOTE — Unmapped (Signed)
Family Medicine Inpatient Service    Progress Note    Team: Family Medicine Blue (pgr [phone])    Hospital Day: 12    ASSESSMENT / PLAN:   Nicholas Rush is a 55 y.o. male with a past medical history significant for HTN, bipolar I??disorder, alcohol use??disorder c/b withdrawal seizures,??recurrent??C??diff??infection, who presents s/p witnessed grand mal seizure for monitored alcohol withdrawal.     ## Alcohol Use Disorder - hx complicated W/d  Last EtOH > 1 week ago. Overall much improved; He is afebrile, grossly oriented, and tolerating PO. CIWA scores consistently < 4, protocol and prn ativan discontinued on 6/27 without complication. Restarted naltrexone given historical positive response. Coordinated care with PCP, who states pt had decreased alcohol use on higher dose of gabapentin. Increased gabapentin from 200 mg BID to 300 mg BID and will discharge on vitamin supplementation.  - PO thiamine and folate daily  - melatonin at bedtime, delirium precautions  - gabapentin from 300 mg BID  - naltrexone 50 mg daily    ## Concern for Aspiration Pneumonia - Cough  Patient's presentation less concerning for infection with no fever, no sign of consolidation on lung exam, but patient did have multiple aspiration events early in his course. S/p Augmentin 875 mg BID x 5 days.  - Prn Robitussin syrup  - Incentive spirometry, chest physiotherapy  - Regular diet per SLP recs    ## Paroxysmal Afib  Previously required Mg2+ and Diltiazem drip. Now, predominantly rate controlled with Mg2+ of 1.7 and K+ of 3.7; remains in Afib. Transitioned back to home oral diltiazem from drip.  - diltiazem 360 mg daily  - IV Mg2+ 4g and po K+ 60 mEq given with goal K >4, Mg >2   - Mag-Ox 800 mg daily, diarrhea with prior TID dosing  - prn Loperamide     # Deconditioning:  Likely combination of sedentary lifestyle, frequent inpatient hospitalizations for delirium and limited ability to meaningfully participate with PO/OT, and heavy alcohol use. PT/OT will continue to meet with patient while inpatient while AIR is pursued.   - PT/OT: 5xH  - AIR referral placed on 7/1    ## Myotonic Clonic Seizure   Most likely provoked seizure 2/2 alcohol or infection. Spot EEG during recent hospitalization not suggestive of underlying pathology. Neurology suggests most consistent with provoked seizure and recommends titration of depakote to therapeutic level.     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 Patient had psych admission where they attempted to switch to lithium, but patient did not tolerate. He has not been stable on antipsychotic monotherapy and has had reasonable stability with subtherapeutic dose of VPA.   - 1 g salt tab TID  - daily bmp  ????  ## Bipolar I Disorder: well-controlled on Depakote and Zyprexa. VPA subtherapeutic, but will not increase due to concern for SIADH and decreasing sodium level.   - continue zyprexa 20mg  at bedtime    ## Tobacco use disorder: Patient currently smoking 2.5 PPD. SW provided education and option for outpatient follow up for tobacco cessation on 6/21.  - Nicotine replacement therapy with patch 42 mg daily, prn gum  ??  # Checklist:  - IVF: None  - Tubes/Lines/Drains: PIV  - Diet: Regular diet, supplementation with Ensure  - Bowel Regimen: prn Loperamide, prn Miralax  - DVT: SQ Lovenox  - Code Status:   Orders Placed This Encounter   Procedures   ??? Full Code  Standing Status:   Standing     Number of Occurrences:   1     - Dispo: Floor    [ ]  Anticipated Discharge Location: tbd  [ ]  PT/OT/DME: 5xH  [ ]  CM/SW needs: pursuing AIR, Substance use resources  [ ]  Meds/Rx:  Not yet prescribed. No special med needs  [ ]  Teaching: None anticipated  [ ]  Follow up appt: Appointment needed  [ ]  Excuse letter: None anticipated  [ ]  Transport: Private Needed    SUBJECTIVE:   No acute events overnight.   Less recorded BMs since decreased magnesium doing.   Under review for AIR. REVIEW OF SYSTEMS:  Pertinent positives and negatives per HPI. A complete review of systems otherwise negative.    PHYSICAL EXAM:      Intake/Output Summary (Last 24 hours) at 07/20/2020 0607  Last data filed at 07/19/2020 2225  Gross per 24 hour   Intake --   Output 100 ml   Net -100 ml       Recent Vitals:  Vitals:    07/19/20 2225   BP: 146/84   Pulse: 79   Resp: 18   Temp: 36.1 ??C   SpO2: 96%       GEN: sleeping soundly, lightly snoring, NAD  Pulm: Normal work of breathing. Auscultation deferred due to pt receiving chest physiotherapy.  Neuro: Sleeping.  Skin: No rashes or skin lesions.       LABS/ STUDIES:    All imaging, laboratory studies, and other pertinent tests including electrocardiography within the last 24 hours were reviewed and are summarized within the assessment and plan.     NUTRITION:  - Regular diet per SLP recs      Anthonette Legato, MD  [facility] Family Medicine PGY-3

## 2020-07-20 NOTE — Unmapped (Signed)
Physician Discharge Summary [facility]  4 BT[facility]  [address]  [place] [address]  Dept: [phone]  Loc: [phone]     Identifying Information:   Nicholas Rush  September 12, 1965  [id number]    Primary Care Physician: Fabio Pierce, MD   Code Status: Full Code    Admit Date: 07/07/2020    Discharge Date: 07/20/2020     Discharge To: Acute Inpatient Rehab [facility])    Discharge Service: [facility]     Discharge Attending Physician: Al Corpus, MD    Discharge Diagnoses:  Active Problems:    Bipolar disorder (CMS-HCC)    Alcohol withdrawal (CMS-HCC)    Alcohol use disorder, moderate, dependence (CMS-HCC)    Hyponatremia    Tobacco use disorder  Resolved Problems:    * No resolved hospital problems. *      Outpatient Provider Follow Up Issues:   [ ]  f/u lower extremity edema    Hospital Course:   Nicholas Rush is a 55 y.o. male with a past medical history significant for HTN, bipolar I disorder, alcohol use disorder c/b withdrawal seizures, recurrent C diff infection who presents s/p witnessed grand Mal seizure for monitored alcohol withdrawal and is found to have new onset LEE.      ## Alcohol Use Disorder - hx complicated W/d  Patient with EtOH at 295 on admission. About 3 days into admission developed withdrawal symptoms including autonomic instability, tremor, hallucination, confusion, and agitation. He was given scheduled Ativan and had serial CIWA scoring with prn Ativan. During admission he was also treated with folate, IV thiamine, and home gabapentin. Discussed resources for sobriety and naltrexone.    ## Fever  On second day of withdrawal patient began to spike fevers. He was noted to have trouble with airway secretions and was producing purulent sputum. Started on Unasyn for empiric coverage of potential aspiration pneumonia. Fever seemed to track more with withdrawal process and patient appeared less infectious. He was transitioned to Augmentin to complete 5 days of antibiotics. ## Paroxysmal Afib  Patient entered into Afib with RVR on third day of withdrawal symptoms. Unable to take PO at the time, he was started on diltiazem drip until he could take his home diltiazem PO. Patient was consistently hypokalemic and hypomagnesemic; repleted daily.      ## Myotonic Clonic Seizure   Seizure at home most likely provoked seizure 2/2 alcohol or infection. Spot EEG during recent hospitalization not suggestive of underlying pathology. Neurology consult suggested that presentation was most consistent with provoked seizure and recommended titration of depakote to therapeutic level.      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 Patient had psych admission where they attempted to switch to lithium, but patient did not tolerate. He has not been stable on antipsychotic monotherapy and has had reasonable stability with subtherapeutic dose of VPA. He continued on home salt tabs.      ## Lower Extremity Edema, resolved  New onset edema without other HF symptoms. Differential includes new onset HF, renal dysfunction (primarily proteinuria), and portal hypertension. Bedside ECHO unremarkable. UPC unremarkable. RUQ [place] mostly unremarkable and without portal HTN, but liver dysfunction could be contributory factor to hypoalbuminemia. Improved with Lasix     ## Diarrhea:   Patient with several c diff infections in the past. Bouts of loose stools while inpatient. C diff negative, low concern for infectious cause.      ##  Bipolar I Disorder: well-controlled on Depakote and Zyprexa. VPA subtherapeutic, but did not increase due to concern for SIADH and decreasing sodium level.           COVID-19 Vaccination: Already completed    Touchbase with Outpatient Provider:  Warm Handoff: Completed on 07/20/20 by Janit Bern  (Resident) via Phone    Procedures:  None  No admission procedures for hospital encounter.  ______________________________________________________________________  Discharge Medications:     Your Medication List      CONTINUE taking these medications    nicotine 21 mg/24 hr patch  Commonly known as: NICODERM CQ  Place 1 patch on the skin daily.        ASK your doctor about these medications    acetaminophen 500 MG tablet  Commonly known as: TYLENOL  Take 500 mg by mouth every six (6) hours as needed for pain.     CALCIUM 500 + D ORAL  Take 1 tablet by mouth daily.     cetirizine 10 MG tablet  Commonly known as: ZyrTEC  Take 10 mg by mouth daily.     diltiazem 360 MG 24 hr capsule  Commonly known as: CARDIZEM CD  Take 1 capsule (360 mg total) by mouth in the morning.     divalproex ER 250 MG extended release 24 hr tablet  Commonly known as: DEPAKOTE ER  Take 3 tablets (750 mg total) by mouth nightly.     fluticasone propionate 50 mcg/actuation nasal spray  Commonly known as: FLONASE  2 sprays into each nostril daily.     gabapentin 100 MG capsule  Commonly known as: NEURONTIN  Take 2 capsules (200 mg total) by mouth Two (2) times a day.     HUMIRA(CF) 40 mg/0.4 mL injection  Generic drug: adalimumab  Inject the contents of 1 syringe (40 mg) under the skin every 14 days     magnesium oxide 400 mg (241.3 mg elemental) tablet  Commonly known as: MAG-OX  Take 2 tablets (800 mg total) by mouth Three (3) times a day.     melatonin 3 mg Tab  Take 1 tablet (3 mg total) by mouth every evening.     naltrexone 50 mg tablet  Commonly known as: DEPADE  Take 1 tablet (50 mg total) by mouth daily.     OLANZapine 20 MG tablet  Commonly known as: ZyPREXA  Take 1 tablet (20 mg total) by mouth nightly.     sodium chloride 1 gram tablet  Take 1 tablet (1 g total) by mouth Three (3) times a day with a meal.  Ask about: Which instructions should I use?     THERA-M Tab  Generic drug: multivitamin,tx-iron-minerals  Take 1 tablet by mouth daily.     thiamine mononitrate (vit B1) 100 mg Tab tablet  Take 1 tablet (100 mg total) by mouth daily.            Allergies:  Tramadol and Hydroxyzine  ______________________________________________________________________  Pending Test Results (if blank, then none):      Most Recent Labs:  All lab results last 24 hours -   Recent Results (from the past 24 hour(s))   Magnesium Level    Collection Time: 07/20/20  9:06 AM   Result Value Ref Range    Magnesium 1.7 1.6 - 2.6 mg/dL   Basic Metabolic Panel    Collection Time: 07/20/20  9:06 AM   Result Value Ref Range    Sodium 133 (L) 135 - 145  mmol/L    Potassium 4.6 3.4 - 4.8 mmol/L    Chloride 100 98 - 107 mmol/L    CO2 23.7 20.0 - 31.0 mmol/L    Anion Gap 9 5 - 14 mmol/L    BUN 9 9 - 23 mg/dL    Creatinine 2.95 6.21 - 1.10 mg/dL    BUN/Creatinine Ratio 15     eGFR CKD-EPI (2021) Male >90 >=60 mL/min/1.58m2    Glucose 95 70 - 179 mg/dL    Calcium 8.5 (L) 8.7 - 10.4 mg/dL     Microbiology -   Microbiology Results (last day)     ** No results found for the last 24 hours. **        LFT's -   No results found for requested labs within last 2 days.     Toxicology screen -   No results found for requested labs within last 2 days.   [      Relevant Studies/Radiology (if blank, then none):  ECG 12 Lead    Result Date: 07/19/2020  ATRIAL FIBRILLATION WITH RAPID VENTRICULAR RESPONSE NO SIGNIFICANT CHANGE SINCE PREVIOUS TRACING Confirmed by Schuyler Amor [phone]) on 07/19/2020 3:33:32 PM    ECG 12 Lead    Result Date: 07/12/2020  ATRIAL FIBRILLATION WITH RAPID VENTRICULAR RESPONSE ST depression, consider subendocardial injury ABNORMAL QRS-T ANGLE, CONSIDER PRIMARY T WAVE ABNORMALITY ABNORMAL ECG Confirmed by Manuella Ghazi [phone]) on 07/12/2020 4:22:48 PM    ECG 12 Lead    Result Date: 07/12/2020  NORMAL SINUS RHYTHM NORMAL ECG Confirmed by Manuella Ghazi [phone]) on 07/12/2020 4:17:10 PM    ECG 12 lead (Adult)    Result Date: 07/08/2020  NORMAL SINUS RHYTHM NORMAL ECG WHEN COMPARED WITH ECG OF 19-May-2020 22:18, SINUS RHYTHM HAS REPLACED ATRIAL FIBRILLATION VENT. RATE HAS DECREASED BY  79 BPM ST NO LONGER DEPRESSED IN ANTEROLATERAL LEADS Confirmed by Mariane Baumgarten ([id number]) on 07/08/2020 8:25:49 AM    XR Chest Portable    Result Date: 07/11/2020  EXAM: XR CHEST PORTABLE DATE: 07/11/2020 2:05 PM ACCESSION: [id number] UN DICTATED: 07/11/2020 2:27 PM INTERPRETATION LOCATION: [facility] CLINICAL INDICATION: 55 years old Male with COUGH  COMPARISON: 07/07/2020 TECHNIQUE: Portable Chest Radiograph. FINDINGS: Overlying monitoring leads. Increased left basal multifocal consolidation. New trace left pleural effusion. Stable cardiomediastinal silhouette.     Increased left basal consolidation, which may reflect airspace disease, including aspiration, versus atelectasis. Trace left pleural effusion.    XR Chest Portable    Result Date: 07/07/2020  EXAM: XR CHEST PORTABLE DATE: 07/07/2020 6:33 PM ACCESSION: [id number] UN DICTATED: 07/07/2020 6:34 PM INTERPRETATION LOCATION: [facility] CLINICAL INDICATION: 55 years old Male with FEVER  COMPARISON: CXR 07/03/2020 TECHNIQUE: Portable Chest Radiograph. FINDINGS: Streaky left basilar opacity, favor atelectasis. No focal consolidation No pleural effusion or pneumothorax. Unremarkable cardiomediastinal silhouette.     The findings of infectious pneumonia or other acute pulmonary process    XR Chest 1 view    Result Date: 07/17/2020  EXAM: XR CHEST 1 VIEW DATE: 07/17/2020 1:43 AM ACCESSION: [id number] UN DICTATED: 07/17/2020 3:03 AM INTERPRETATION LOCATION: [facility] CLINICAL INDICATION: 55 years old Male with HYPOXEMIA  COMPARISON: 07/11/2020 TECHNIQUE: Portable Chest Radiograph. FINDINGS: No lines or tubes. Streaky bibasilar opacities, decreased on the left compared to prior. No pleural effusion or pneumothorax. Stable cardiomediastinal silhouette.     Improving left basilar opacities. No new focal consolidation.    US Liver Doppler    Result Date: 07/09/2020  EXAM: US LIVER DOPPLER DATE: 07/09/2020 7:49 PM ACCESSION: [id number] UN DICTATED:  07/09/2020 7:22 PM INTERPRETATION LOCATION: [facility] CLINICAL INDICATION: 55 years old Male with Evaluate for Portal HTN  TECHNIQUE: Ultrasound views of the complete abdomen were obtained using gray scale and color and spectral Doppler imaging. COMPARISON: CT abdomen pelvis 03/14/2020 FINDINGS: Limited visualization of midline structures secondary to bowel gas. LIVER: The liver demonstrate increased echogenicity. No focal hepatic lesions. No intrahepatic biliary ductal dilatation. The common bile duct is normal in caliber. GALLBLADDER: The gallbladder is physiologically distended without internal stones or sludge. Sonographic Eulah Pont sign is negative. No pericholecystic fluid. No gallbladder wall thickening. PANCREAS: Visualized portion is unremarkable. SPLEEN: Normal in size and echotexture. KIDNEYS: Normal in size and echotexture. No solid masses or calculi. Right lower pole cyst measures 1.4 x 1.2 x 0.3 cm. No hydronephrosis. VESSELS - Portal vein: The main, left and right portal veins are patent with hepatopetal flow. Normal main portal vein velocity (0.20 m/s or greater). Mildly sluggish flow is noted in the left portal vein. - Splenic vein: Patent, with hepatopetal flow. - Hepatic veins/IVC: The IVC, left, middle and right hepatic veins are patent with bi/triphasic waveforms. - Hepatic artery: Patent - Proximal aorta:  unremarkable OTHER: No ascites.     --Patent hepatic vasculature with normal flow direction. Mildly sluggish flow is noted in left portal vein. --Increased hepatic echogenicity, can be seen with hepatic steatosis. Please see below for data measurements: Liver: 16.5 cm Common bile duct: 0.8 cm Gallbladder wall: 0.20 cm Sonographic Murphy's Sign: negative Pericholecystic fluid visualized: no Right kidney: 11.2 cm Left kidney: 12.0 cm Main portal vein diameter: 0.9 cm Main portal vein velocity: 0.401 m/s Anterior right portal vein velocity: 0.210 m/s Posterior right portal vein velocity: 0.226 m/s Left portal vein velocity: 0.154 m/s Main portal vein flow: hepatopetal Right portal vein flow: hepatopetal Left portal vein flow: hepatopetal Common hepatic artery: Patent Left hepatic vein flow: bi-tri Middle hepatic vein flow: bi-tri Right hepatic vein flow: bi-tri Inferior vena cava flow: bi-tri Splenic vein midline: hepatopetal Splenic vein proximal: hepatopetal Abdominal free fluid visualized: no Aorta: partially visualized Inferior vena cava: partially visualized Spleen: 9.9 cm     ______________________________________________________________________  Discharge Instructions:               Resources and Referrals     Commode      Length of Need: 99 months    Type of commode: Chair      Call [facility] to reschedule assessment for personal care services  Contact for Chestine Spore is [phone].            Follow Up instructions and Outpatient Referrals     Ambulatory referral to Home Health      Is this a [facility] or [facility] Patient?: Yes    Home Health Options: Traditional Home Health    Is this patient at high risk for COVID 19 transmission and recommended to   stay at home during this pandemic?: Yes    Do you want agency provider parameter notifications or patient specific   provider parameter notifications?: Agency    Do you want to initiate remote patient monitoring?: No    Physician to follow patient's care: PCP    Disciplines requested:  Nursing  Physical Therapy  Occupational Therapy  Home Health Aide  Medical Social Work  Speech Language Pathology       Nursing requested: Teaching/skilled observation and assessment    What teaching is needed (new diagnosis? new medications?): Vitals, med   mgmt, disease education  Physical Therapy requested:  Evaluate and treat  Home safety evaluation       Occupational Therapy Requested: Evaluate and treat    Speech Language Pathology requested: Evaluate and treat    Medical Social Work requested: General area of patient need    General area of patient need: Alcohol treatment resources; caregiver   resources        Appointments which have been scheduled for you    Jul 21, 2020 11:00 AM  PT EVAL with Manning Charity, PT  [facility] PHYSICAL THERAPY [facility] REGION) [address]  [place] [address]        Jul 21, 2020  1:00 PM  OT EVAL with REHAB OT [facility]  REHAB OCCUPATIONAL THERAPY [facility] REGION) [address]  [place] [address]        Jul 30, 2020 12:15 PM  (Arrive by 12:00 PM)  NEW HCP TELEPHONE with Haskel Khan, [facility]  [facility] ALCOHOL AND SUBSTANCE ABUSE PROGRAM [place] [facility] REGION) [address] [place]  [place] [address]  [phone]   Please DO NOT come to the clinic for this visit. We will call you to discuss your plan of care.        Oct 02, 2020  1:00 PM  (Arrive by 12:45 PM)  NEW ADULT with Nurum Joeseph Amor, MD  [facility] INTERNAL MEDICINE WEAVER CROSSING [facility] REGION) [address]  [address]  [place] [address]  [phone]             ______________________________________________________________________  Discharge Day Services:  BP 142/82  - Pulse 95  - Temp 35.8 ??C (Temporal)  - Resp 18  - Ht 185.4 cm (6' 1)  - Wt 91 kg (200 lb 9.6 oz)  - SpO2 93%  - BMI 26.47 kg/m??   Pt seen on the day of discharge and determined appropriate for discharge.    GEN: well appearing, resting comfortably in bed, NAD  Pulm: Normal work of breathing.   Neuro: Sleeping  Skin: No rashes or skin lesions        Condition at Discharge: good    Length of Discharge: I spent greater than 30 mins in the discharge of this patient.        Anthonette Legato, MD   [facility] Family Medicine PGY-3

## 2020-07-20 NOTE — Unmapped (Signed)
AOX4, takes med whole, ambulatory, on regular diet. No acute changes on this shift.   Problem: Adult Inpatient Plan of Care  Goal: Plan of Care Review  Outcome: Progressing  Goal: Patient-Specific Goal (Individualized)  Outcome: Progressing  Goal: Absence of Hospital-Acquired Illness or Injury  Outcome: Progressing  Intervention: Identify and Manage Fall Risk  Recent Flowsheet Documentation  Taken 07/19/2020 2000 by Dillard Cannon, RN  Safety Interventions:   fall reduction program maintained   lighting adjusted for tasks/safety   low bed   nonskid shoes/slippers when out of bed  Intervention: Prevent Infection  Recent Flowsheet Documentation  Taken 07/19/2020 2000 by Dillard Cannon, RN  Infection Prevention:   equipment surfaces disinfected   hand hygiene promoted   single patient room provided  Goal: Optimal Comfort and Wellbeing  Outcome: Progressing  Goal: Readiness for Transition of Care  Outcome: Progressing  Goal: Rounds/Family Conference  Outcome: Progressing     Problem: Skin Injury Risk Increased  Goal: Skin Health and Integrity  Outcome: Progressing  Intervention: Optimize Skin Protection  Recent Flowsheet Documentation  Taken 07/19/2020 1950 by Dillard Cannon, RN  Pressure Reduction Techniques: frequent weight shift encouraged  Pressure Reduction Devices: positioning supports utilized     Problem: Fall Injury Risk  Goal: Absence of Fall and Fall-Related Injury  Outcome: Progressing  Intervention: Promote Scientist, clinical (histocompatibility and immunogenetics) Documentation  Taken 07/19/2020 2000 by Dillard Cannon, RN  Safety Interventions:   fall reduction program maintained   lighting adjusted for tasks/safety   low bed   nonskid shoes/slippers when out of bed     Problem: Self-Care Deficit  Goal: Improved Ability to Complete Activities of Daily Living  Outcome: Progressing

## 2020-07-20 NOTE — Unmapped (Signed)
Facility Information:    Facility ID:   Facility Medicare Provider Number:         Physical Medicine and Rehabilitation  Rehab Pre-Admit Screening  Date: 07/20/2020   Time: 8:19 AM     Patient Information:    Patient Name:  Nicholas Rush Medical Record Number: [id number]   Address:  [address] [place] [address] Sex: Male   Date of Birth: Jan 29, 1965 Age: 55 y.o.   Room/Bed:  4H03/4H03-01    _____________________________________________________________________________    Attending Physician's Review and Admission Determination       Medical Necessity:  The patient requires acute inpatient rehabilitation to maximize functional independence and requires daily physician visits to manage HTN, A-Fib, seizures and deconditioning.  This patient's rehabilitation goals and medical complexity could not adequately be managed in a less intensive setting.  Potential risks for clinical complications include seizures, further withdrawal, falls, heart failure.    Medical Prognosis: Good for continued progress and participation with therapy.    Anticipated Interdisciplinary Rehabilitation Interventions: Physical therapy to work on mobility. Occupational therapy to work on self care. Speech therapy to work on cognitive deficits.. Weekly interdisciplinary team conference to assess progress and plan of care changes.    Expected Functional Outcomes:  Expected level of improvement: Goals for inpatient rehabilitation are home with assistance from wife.    Court Joy, MD 07/20/2020 11:38 AM                _____________________________________________________________________________    Advanced Directives:                        Coverage Information:  Authorization number:    Authorization Code:  Activation code not generated  Current MyChart Status: Active  Payor: MEDICAID [place] / Plan: MEDICAID [place] ACCESS / Product Type: *No Product type* /     Physician/Referral Information:  Referring Facility: [facility] Al Corpus, MD  Referring Case Manager: Wynne Dust           Prior Living Situation:  Living Environment: Mobile home    Lives With: Spouse    Home Living: One level home; Ramped entrance; Tub/shower unit; Hand-held shower hose; Standard height toilet; Raised toilet seat with rails; Tub bench                 Prior Level of Function:  Prior Function Comments: Pt reported min/mod A for ADLs and grossly mod I using rollator. Pt requires assist for showering and LE dressing. Pt typically completed eating and grooming tasks in seated position independently                           Rehabilitation Diagnosis:  Etiologic Diagnosis / Description: Nicholas Rush is a 55 year old male with a past medical history significant for HTN, bipolar I disorder, alcohol use disorder c/b withdrawal seizures, recurrent C diff infection, who presents s/p witnessed grand mal seizure for monitored alcohol withdrawal. Nicholas Rush has participated in acute inpatient physical and occupational therapies to improve functional mobility, activity tolerance, functional strength, balance, and endurance in order to facilitate safe performance of ADLs and daily routines.  He participated in acute inpatient speech-language therapy for executive functioning, expressive speech, receptive speech, and dysphagia management. Nicholas Rush has been referred to [facility] for continued acute medical management, provision of intensive inpatient therapies, and patient/family training to facilitate safe performance of ADLs  and mobility, including functional cognition and self-care prior to discharge home.      Impairment Group: (Neurologic Conditions) 03.9 Other Neurologic      Date of Onset: 07/07/2020              Patient Active Problem List    Diagnosis Date Noted   ??? 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   ??? Paroxysmal atrial fibrillation (CMS-HCC) 05/20/2020   ??? Chronic midline low back pain 03/26/2020   ??? Encephalopathy 03/23/2020 ??? Tobacco use disorder 02/23/2020   ??? Thrombocytopenia (CMS-HCC) 10/19/2019   ??? Macrocytic anemia 10/19/2019   ??? Hyponatremia 10/19/2019   ??? Recurrent falls 02/20/2019   ??? Positive colorectal cancer screening using DNA-based stool test 06/24/2016   ??? Psoriatic arthritis (CMS-HCC) 06/07/2015   ??? Psoriasis    ??? Anxiety state 06/08/2012   ??? Essential hypertension 06/08/2012   ??? Alcohol use disorder, moderate, dependence (CMS-HCC) 05/15/2012   ??? Bipolar disorder (CMS-HCC) 05/11/2012   ??? Alcohol withdrawal (CMS-HCC) 05/11/2012       Medical / Functional Conditions Requiring Inpatient Rehab: Patient requires monitoring of labwork, electrolytes, blood pressure and monitoring/management of medical diagnosis. Medical management/administration of antihypertensives, anticoagulants and other prescribed/necessary medications.  Monitoring/management of pain, cardiopulmonary status, renal status, gastrointestinal status, neurological status, infection, bleeding, bowel and bladder, DVT, and skin breakdown.      Risk for Medical / Clinical Complication: Patient at increased risk for complications of cardiopulmonary insufficiency, renal insufficiency, hypertensive crisis, gastrointestinal insufficiency, neurological decline, seizures, fluid volume overload, aspiration, bleeding, infection, uncontrolled pain, bowel/bladder retention, falls, and skin breakdown.      Special Rehabilitation Needs:  IV Lines / Tubes / Drains: PIC, Condom cath          Hearing - Right Ear: Functional    Hearing - Left Ear: Functional       Cultural Requests During Hospitalization: Pt at increased risk of anxiety/depression related to recent medical status change    Safety Equipment at Bedside: Suction    Nutrition Type: General          Weight Bearing Status: Non-applicable    Requires modified schedule: Pt may require rest breaks between therapy sessions      Precautions: Falls precautions; Seizure precautions; Aspiration precautions       Required Braces or Orthoses: Non-applicable      Lines/Drains/Airways:                   Past Medical History:  Past Medical History:   Diagnosis Date   ??? Alcohol withdrawal syndrome, with delirium (CMS-HCC) 05/11/2012   ??? Anxiety    ??? Bipolar 1 disorder (CMS-HCC)    ??? Depressive disorder    ??? Erectile dysfunction    ??? Esophageal reflux    ??? History of pyloric stenosis    ??? Hypertension    ??? Insomnia    ??? Psoriasis     Dermatologist - Dr. Deloria Lair in [facility]   ??? Seizure (CMS-HCC) 02/20/2019   ??? Tobacco use     1ppd     Past Surgical History:  Past Surgical History:   Procedure Laterality Date   ??? PYLOROMYOTOMY     ??? WISDOM TOOTH EXTRACTION       Family History:  Family History   Problem Relation Age of Onset   ??? Bipolar disorder Mother    ??? Depression Sister    ??? Alcohol abuse Sister    ??? Alcohol abuse Maternal Aunt    ??? Melanoma  Neg Hx    ??? Basal cell carcinoma Neg Hx    ??? Squamous cell carcinoma Neg Hx      Social History:  Social History     Socioeconomic History   ??? Marital status: Married     Spouse name: None   ??? Number of children: None   ??? Years of education: None   ??? Highest education level: None   Tobacco Use   ??? Smoking status: Current Every Day Smoker     Packs/day: 1.50     Years: 30.00     Pack years: 45.00     Types: Cigarettes   ??? Smokeless tobacco: Never Used   Vaping Use   ??? Vaping Use: Never used   Substance and Sexual Activity   ??? Alcohol use: Yes     Comment: 3 drinks a day.   ??? Drug use: Yes     Types: Marijuana   Other Topics Concern   ??? Do you use sunscreen? Yes   ??? Tanning bed use? Yes   ??? Are you easily burned? Yes   ??? Excessive sun exposure? No   ??? Blistering sunburns? Yes   Social History Narrative    Updated 01/03/2020        Lives in [place] with wife and two children.  Currently unemployed had Worked at [employer].  Current smoker.History of excessive ETOh abuse has been sober since September 2021.Marland Kitchen           PSYCHIATRIC HX:     -Current provider(s):  [facility] Dr. Roxanne Gates     -Suicide attempts/SIB: NO    -Psych Hospitalizations:  2014 for depression at [facility]    -Med compliance hx: Poor,      -Fa hx suicide: YES, sister attempted         SUBSTANCE ABUSE HX:     -Current using substance: sig hx of alcohol use (last drink Sept 2021) and was hospitalized for withdrawal with DT    -Hx w/d sxs: YES    -Sz Hx: YES,    -DT Hx:YES, Sept 2021        SOCIAL HX:    -Current living environment: lives in [place] with wife and 2 childrne     -Current support: wife, father     -Violence (perp): verbal, has been kicking walls     -Access to Firearms: NO        -Guardian: NO        -Trauma: NO             Social Determinants of Health     Financial Resource Strain: Medium Risk   ??? Difficulty of Paying Living Expenses: Somewhat hard   Food Insecurity: No Food Insecurity   ??? Worried About Running Out of Food in the Last Year: Never true   ??? Ran Out of Food in the Last Year: Never true   Transportation Needs: Unmet Transportation Needs   ??? Lack of Transportation (Medical): Yes   ??? Lack of Transportation (Non-Medical): Yes     Current Facility-Administered Medications Ordered in Epic   Medication Dose Route Frequency Provider Last Rate Last Admin   ??? dilTIAZem (CARDIZEM CD) 24 hr capsule 360 mg  360 mg Oral Daily Beaulah Dinning, MD   360 mg at 07/19/20 0826   ??? divalproex ER (DEPAKOTE ER) extended release 24 hr tablet 750 mg  750 mg Oral Nightly Beaulah Dinning, MD   750 mg at 07/19/20 2139   ???  enoxaparin (LOVENOX) syringe 40 mg  40 mg Subcutaneous Q24H [employer] Beaulah Dinning, MD   40 mg at 07/19/20 1610   ??? folic acid (FOLVITE) tablet 1 mg  1 mg Oral Daily Beaulah Dinning, MD   1 mg at 07/19/20 0825   ??? gabapentin (NEURONTIN) capsule 300 mg  300 mg Oral BID Beaulah Dinning, MD   300 mg at 07/19/20 2113   ??? guaiFENesin (ROBITUSSIN) oral syrup  200 mg Oral Q4H PRN Beaulah Dinning, MD   200 mg at 07/20/20 0136   ??? loperamide (IMODIUM) capsule 4 mg  4 mg Oral Q4H PRN Beaulah Dinning, MD   4 mg at 07/19/20 1750   ??? magnesium oxide (MAG-OX) tablet 800 mg  800 mg Oral Daily Beaulah Dinning, MD   800 mg at 07/19/20 0825   ??? melatonin tablet 3 mg  3 mg Oral QPM Beaulah Dinning, MD   3 mg at 07/18/20 2143   ??? naltrexone (DEPADE) tablet 50 mg  50 mg Oral Daily Beaulah Dinning, MD   50 mg at 07/19/20 0826   ??? nicotine (NICODERM CQ) 21 mg/24 hr patch 2 patch  2 patch Transdermal Daily Beaulah Dinning, MD   2 patch at 07/19/20 0827   ??? nicotine polacrilex (NICORETTE) gum 2 mg  2 mg Buccal Q2H PRN Beaulah Dinning, MD       ??? OLANZapine (ZyPREXA) tablet 20 mg  20 mg Oral Nightly Beaulah Dinning, MD   20 mg at 07/19/20 2139   ??? potassium chloride (KLOR-CON) packet 30 mEq  30 mEq Oral BID Beaulah Dinning, MD   30 mEq at 07/19/20 1417   ??? sodium chloride tablet 1 g  1 g Oral 3xd Meals Beaulah Dinning, MD   1 g at 07/19/20 1715   ??? thiamine mononitrate (vit B1) tablet 100 mg  100 mg Oral Daily Beaulah Dinning, MD   100 mg at 07/19/20 9604     No current Epic-ordered outpatient medications on file.     Medications Prior to Admission   Medication Sig Dispense Refill Last Dose   ??? acetaminophen (TYLENOL) 500 MG tablet Take 500 mg by mouth every six (6) hours as needed for pain.      ??? ADALIMUMAB SYRINGE CITRATE FREE 40 MG/0.4 ML Inject the contents of 1 syringe (40 mg) under the skin every 14 days 4 each 1    ??? calcium carbonate/vitamin D3 (CALCIUM 500 + D ORAL) Take 1 tablet by mouth daily.      ??? cetirizine (ZYRTEC) 10 MG tablet Take 10 mg by mouth daily.      ??? diltiazem (CARDIZEM CD) 360 MG 24 hr capsule Take 1 capsule (360 mg total) by mouth in the morning. 30 capsule 0    ??? divalproex ER (DEPAKOTE ER) 250 MG extended release 24 hr tablet Take 3 tablets (750 mg total) by mouth nightly. 270 tablet 1    ??? fluticasone propionate (FLONASE) 50 mcg/actuation nasal spray 2 sprays into each nostril daily. 16 g 12    ??? gabapentin (NEURONTIN) 100 MG capsule Take 2 capsules (200 mg total) by mouth Two (2) times a day. 120 capsule 0    ??? magnesium oxide (MAG-OX) 400 mg (241.3 mg elemental magnesium) tablet Take 2 tablets (800 mg total) by mouth Three (3) times a day. 180 tablet 0    ??? melatonin 3 mg Tab Take 1 tablet (3 mg total)  by mouth every evening. 30 tablet 0    ??? multivitamin,tx-iron-minerals (THERA-M) Tab Take 1 tablet by mouth daily. 30 tablet 0    ??? naltrexone (DEPADE) 50 mg tablet Take 1 tablet (50 mg total) by mouth daily. 30 tablet 0    ??? nicotine (NICODERM CQ) 21 mg/24 hr patch Place 1 patch on the skin daily. 28 patch 5    ??? OLANZapine (ZYPREXA) 20 MG tablet Take 1 tablet (20 mg total) by mouth nightly. 30 tablet 0    ??? sodium chloride 1 gram tablet Take 1 tablet (1 g total) by mouth Three (3) times a day with a meal. 100 tablet 2    ??? thiamine mononitrate, vit B1, 100 mg Tab tablet Take 1 tablet (100 mg total) by mouth daily. 90 tablet 0           Vitals:    07/19/20 1411 07/19/20 1837 07/19/20 2225 07/20/20 0756   BP:  146/84 146/84 142/82   Pulse: 89 94 79 95   Resp: 26 18 18 18    Temp:  37.1 ??C (98.7 ??F) 36.1 ??C (96.9 ??F) 35.8 ??C (96.5 ??F)   TempSrc:  Oral Oral Temporal   SpO2: 94% 95% 96% 93%   Weight:       Height:           Vitals:    Height:  185.4 cm (6' 1), Weight: 91 kg (200 lb 9.6 oz)    Labs:      CBC -   Lab Results   Component Value Date    WBC 5.5 07/19/2020    RBC 3.56 (L) 07/19/2020    HGB 12.8 (L) 07/19/2020    HCT 36.4 (L) 07/19/2020    MCV 102.3 (H) 07/19/2020    MCH 36.1 (H) 07/19/2020    MCHC 35.2 07/19/2020    MPV 8.7 07/19/2020    PLT 261 07/19/2020     BMP -   Lab Results   Component Value Date    NA 133 (L) 07/19/2020    K 3.7 07/19/2020    CL 100 07/19/2020    CO2 23.9 07/19/2020    BUN 7 (L) 07/19/2020    CREATININE 0.58 (L) 07/19/2020    GFRAAM >90 06/01/2020    GFRNAAM >90 06/01/2020    GLU 99 07/19/2020     CARD -   Lab Results   Component Value Date    TROPONINI 12 05/20/2020     Coagulation - Lab Results   Component Value Date    PT 12.1 07/09/2020    INR 1.03 07/09/2020    APTT 36.4 02/23/2020     ABGs- No results found for: PHART, PO2ART, PCO2ART, BEART, HCO3ART, O2SATART  LFT's -   Lab Results   Component Value Date    ALBUMIN 2.7 (L) 07/16/2020    ALT 12 07/16/2020    AST 23 07/16/2020    ALKPHOS 58 07/16/2020    BILITOT 0.4 07/16/2020    BILIDIR 0.20 10/28/2019    PROT 7.4 07/16/2020       Current Functional Status:     ADLs - Needs Assistance: Feeding; Toileting    Feeding - Needs Assistance: Set Up Assist    Grooming - Needs Assistance: Set Up Assist    Bathing - Needs Assistance: Mod assist    Toileting - Needs Assistance: Mod assist    UB Dressing - Needs Assistance: Set Up Assist    LB Dressing - Needs Assistance: Min assist  Mobility:   Bed Mobility: Min A    Transfers: Min A    Gait: amb w/ RW, min A, for balance and RW management, 30' x 2 w/ sitting rest break.  Mod VCs for step length, balance, RW distance.    Stairs: NT    Wheelchair Mobility: NT      Cognition/Swallow/Speech: AOx4, clear speech, following commands, regular diet                   DME Recommendations:  PT DME Recommendations: Defer to post acute    OT DME Recommendations: Defer to post acute      Willingness to Participate: Patient able and willing to particiapte in 3 hours of therapy  Rehab Goals and Plan    Expected level of improvement for safe discharge: Patient will discharge home as independent as possible with ADLs and functional mobility with least restrictive assistive device for household distances.     Patient / Family Goals: Patient will safely return home with family, modified independence in ADLs and assist as needed with ambulation for household distances.    Required treatments and services: Rehab nursing, Case management, Dietician/nutrition     Anticipated Interventions:    Physical Therapy: 60-120 min/day 5-7 days/wk  Occupational Therapy: 60-120 min/day 5-7 days/wk  Speech Therapy: 30-60 min/day 3-5 days/wk  Recreational therapy: 30 min/day 3 days/wk  Prosthetics and Orthotics: As Needed      Anticipated services upon discharge:     Outpatient therapy: PT, OT    Expected discharge destination: Home with assistance of wife, daughter, son and PCS    Discharge support: Patient has a caregiver available    Patient/family/caregiver orientation: Patient and family agreeable to inpatient rehab plan    Estimated Length of Stay: 7-14 days     Projected Admission Date: Saturday July 20 2020    Reviewer's Signature, Date and Time: Myriam Forehand [facility]  [facility]  Inpatient Coordinator  [phone]    8:20 AM 07/20/2020

## 2020-07-20 NOTE — Unmapped (Signed)
This Clinical research associate discussed [place] [facility] and expectations with patient and family; discussed requirements for active participation in therapies, goal to discharge home, visiting hours/policy and involvement of family/caregivers in family training. Patient and family verbalized agreement with all AIR policies and AIR admission. All questions re: AIR were answered.      Discharge Plan:  Home with assistance of wife who does work 6 minutes away, PCS is to start once he is home, daughter in area who works but can assist as well, lives with 38 year old son who helps get patient to appointments PTA       *Note:This meeting does not guarantee admission to the [facility]. Final acceptance is pending insurance authorization and/or Physical Medicine and Rehabilitation Physician approval*      Nicholas Rush Lorie Apley, [facility]  [facility]  Inpatient Coordinator  [phone]    8:18 AM 07/20/2020

## 2020-07-20 NOTE — Unmapped (Signed)
Physical Medicine and Rehab  Post-admission Physician Evaluation (PAPE)    ASSESSMENT:     Nicholas Rush is a 55 y.o. male with PMH of HTN, bipolar I??disorder, alcohol use??disorder c/b withdrawal seizures,??recurrent??C??diff??infection, who presented s/p witnessed grand mal seizure for monitored alcohol withdrawal.    Rehab Impairment Group Code [facility]): (Neurologic Conditions) 03.9 Other Neurologic  Etiology:Neurological Change secondary to acute alcohol withdrawal complicated by seizure    PLAN:     This patient is admitted to the Physical Medicine and Rehabilitation - Inpatient - B service, please page [phone] from 8am-5pm on weekdays for questions regarding this patient. After hours and on weekends please contact the 1st call resident pager     REHAB:   - PT and OT to maximize functional status with mobility and ADLs as well as prevention of joint contracture.   - SLP for cognitive and swallow function.  - Neuropsych for higher level cognitive evaluation and coping.  - RT for community re-integration, education, and leisure support services.  - Nutrition consult for diet information/teaching.   - To be discussed in weekly Interdisciplinary Team Conference.    Alcohol Use Disorder -??hx complicated W/d  Patient with EtOH at 295 on admission. About 3 days into admission developed withdrawal symptoms including autonomic instability, tremor, hallucination, confusion, and agitation. He was given scheduled Ativan and had serial CIWA scoring with prn Ativan. During admission he was also treated with folate, IV thiamine, and home gabapentin. Discussed resources for sobriety and naltrexone.  - PO thiamine and folate daily  - zofran PRN  - melatonin at bedtime, delirium precautions  - INCREASE gabapentin from 200mg  to 300 mg BID (6/30)  - Naltrexone 50 mg daily  ??  Concern for Aspiration Pneumonia - Cough  On second day of withdrawal patient began to spike fevers. He was noted to have trouble with airway secretions and was producing purulent sputum. Started on Unasyn for empiric coverage of potential aspiration pneumonia. Fever seemed to track more with withdrawal process and patient appeared less infectious. He was transitioned to Augmentin to complete 5 days of antibiotics.   - Completed Abx course  -CXR 6/29 w/ improved consolidation  - Prn Robitussin syrup  - Incentive spirometry, chest physiotherapy  - Regular diet per SLP recs  ??  Paroxysmal Afib  Patient entered into Afib with RVR on third day of withdrawal symptoms. Unable to take PO at the time, he was started on diltiazem drip until he could take his home diltiazem PO. Patient was consistently hypokalemic and hypomagnesemic; repleted daily. Transitioned back to home oral diltiazem from drip.  - diltiazem 360 mg daily  - Replete K >4, Mg >2   - Reported loose stools decrease Mag  - Loperamide 4 mg   - Consider repeat C diff if continued   ??  Diarrhea  Patient with several c diff infections in the past. Bouts of loose stools while inpatient. C diff negative, low concern for infectious cause.   -CTM    Myotonic Clonic Seizure   Seizure at home most likely provoked seizure 2/2 alcohol or infection. Spot EEG during recent hospitalization not suggestive of underlying pathology. Neurology consult suggested that presentation was most consistent with provoked seizure and recommended titration of depakote to therapeutic level.   ??  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 Patient had psych admission where they attempted to switch to lithium, but patient did not tolerate. He has not been stable on  antipsychotic monotherapy and has had reasonable stability with subtherapeutic dose of VPA. He continued on home salt tabs.   - 1 g salt tab TID  - CTM  ????  Lower Extremity Edema, resolved  New onset edema without other HF symptoms. Differential includes new onset HF, renal dysfunction (primarily proteinuria), and portal hypertension. Bedside ECHO unremarkable. UPC unremarkable. RUQ [place] mostly unremarkable and without portal HTN, but liver dysfunction could be contributory factor to hypoalbuminemia. Improved with Lasix  -Continue Lasix     Bipolar I Disorder: well-controlled on Depakote and Zyprexa. VPA subtherapeutic, but will not increase due to concern for SIADH and decreasing sodium level.   - continue zyprexa 20mg  at bedtime  ??  Tobacco use disorder: Patient currently smoking??2.5 PPD. SW provided education and option for outpatient follow up for tobacco cessation on 6/21.  - Nicotine replacement therapy with patch 42 mg daily, prn gum    Daily Checklist  - Diet:Regular   - DVT:SQ enoxaparin   - GI ZOX:WRUE  - Code: Full    DISPO: Admitted to Rehab floor, patient will be discussed at next interdisciplinary team conference.     Estimated Length of Stay: 7-14 days    Anticipated Post-Rehab Destination / Needs: home      Medical Necessity:  The patient requires acute inpatient rehabilitation to maximize functional independence and requires daily physician visits to manage HTN, A-Fib, seizures and deconditioning.  This patient's rehabilitation goals and medical complexity could not adequately be managed in a less intensive setting.  Potential risks for clinical complications include seizures, further withdrawal, falls, heart failure.  ??  Medical Prognosis: Good for continued progress and participation with therapy.  ??  Anticipated Interdisciplinary Rehabilitation Interventions: Physical therapy to work on mobility. Occupational therapy to work on self care. Speech therapy to work on cognitive deficits.. Weekly interdisciplinary team conference to assess progress and plan of care changes.  ??  Expected Functional Outcomes:  Expected level of improvement: Goals for inpatient rehabilitation are home with assistance from wife.    SUBJECTIVE:     Reason for Admission: Comprehensive interdisciplinary inpatient rehabilitation program.    History of Present Illness:   Nicholas Rush is a 55 year old male with a past medical history significant for HTN, bipolar I disorder, alcohol use disorder c/b withdrawal seizures, recurrent C diff infection, who presents s/p witnessed grand mal seizure for monitored alcohol withdrawal. [name] has participated in acute inpatient physical and occupational therapies to improve functional mobility, activity tolerance, functional strength, balance, and endurance in order to facilitate safe performance of ADLs and daily routines.  He participated in acute inpatient speech-language therapy for executive functioning, expressive speech, receptive speech, and dysphagia management. [name] has been referred to [facility] for continued acute medical management, provision of intensive inpatient therapies, and patient/family training to facilitate safe performance of ADLs and mobility, including functional cognition and self-care prior to discharge home.     Today, patient is excited about rehab. Pt denies having pain. Denies fever/sweats/chills. No recent weight changes or extremity swelling. Denies HA, dizziness, vision changes, changes in strength or sensation, issues with balance or coordination. Denies chest pain, palpitations, SOB, cough. Eating well tolerating PO, no difficulty or pain with swallowing. No N/V or abdominal pain. No issues with incontinence. BMs regular, no constipation or diarrhea. No difficulty urinating, leaking urine, dysuria. Patient sleeping well. No issues with mood.    Pre-Morbid Functional Status: Pre-Admission Functional Status:   Not able to state  ADLs - Needs Assistance: Feeding; Toileting  ??  Feeding - Needs Assistance: Set Up Assist  ??  Grooming - Needs Assistance: Set Up Assist  ??  Bathing - Needs Assistance: Mod assist  ??  Toileting - Needs Assistance: Mod assist  ??  UB Dressing - Needs Assistance: Set Up Assist  ??  LB Dressing - Needs Assistance: Min assist  ??  ??  Mobility:   Bed Mobility: Min A  ??  Transfers: Min A  ??  Gait: amb w/ RW, min A, for balance and RW management, 30' x 2 w/ sitting rest break.  Mod VCs for step length, balance, RW distance.  ??  Stairs: NT  ??  Wheelchair Mobility: NT  ??  ??  Cognition/Swallow/Speech: AOx4, clear speech, following commands, regular diet       Medical / Surgical History: Reviewed  Past Medical History:   Diagnosis Date   ??? Alcohol withdrawal syndrome, with delirium (CMS-HCC) 05/11/2012   ??? Anxiety    ??? Bipolar 1 disorder (CMS-HCC)    ??? Depressive disorder    ??? Erectile dysfunction    ??? Esophageal reflux    ??? History of pyloric stenosis    ??? Hypertension    ??? Insomnia    ??? Psoriasis     Dermatologist - Dr. Deloria Lair in [facility]   ??? Seizure (CMS-HCC) 02/20/2019   ??? Tobacco use     1ppd     Past Surgical History:   Procedure Laterality Date   ??? PYLOROMYOTOMY     ??? WISDOM TOOTH EXTRACTION       Social History: Reviewed  Social History     Tobacco Use   ??? Smoking status: Current Every Day Smoker     Packs/day: 1.50     Years: 30.00     Pack years: 45.00     Types: Cigarettes   ??? Smokeless tobacco: Never Used   Vaping Use   ??? Vaping Use: Never used   Substance Use Topics   ??? Alcohol use: Yes     Comment: 3 drinks a day.   ??? Drug use: Yes     Types: Marijuana     Living Environment: Mobile home  Lives With: Spouse  Home Living: One level home, Ramped entrance, Tub/shower unit, Hand-held shower hose, Standard height toilet, Raised toilet seat with rails, Tub bench    Family History: Pertinent as stated and otherwise reviewed and non-contributory   family history includes Alcohol abuse in his maternal aunt and sister; Bipolar disorder in his mother; Depression in his sister.    Allergies: Reviewed  Tramadol and Hydroxyzine    Medications at Discharge from Acute Hospital: Reviewed     Your Medication List      CONTINUE taking these medications    nicotine 21 mg/24 hr patch  Commonly known as: NICODERM CQ  Place 1 patch on the skin daily.        ASK your doctor about these medications    acetaminophen 500 MG tablet  Commonly known as: TYLENOL  Take 500 mg by mouth every six (6) hours as needed for pain.     CALCIUM 500 + D ORAL  Take 1 tablet by mouth daily.     cetirizine 10 MG tablet  Commonly known as: ZyrTEC  Take 10 mg by mouth daily.     diltiazem 360 MG 24 hr capsule  Commonly known as: CARDIZEM CD  Take 1 capsule (360 mg total) by mouth in the  morning.     divalproex ER 250 MG extended release 24 hr tablet  Commonly known as: DEPAKOTE ER  Take 3 tablets (750 mg total) by mouth nightly.     fluticasone propionate 50 mcg/actuation nasal spray  Commonly known as: FLONASE  2 sprays into each nostril daily.     gabapentin 100 MG capsule  Commonly known as: NEURONTIN  Take 2 capsules (200 mg total) by mouth Two (2) times a day.     HUMIRA(CF) 40 mg/0.4 mL injection  Generic drug: adalimumab  Inject the contents of 1 syringe (40 mg) under the skin every 14 days     magnesium oxide 400 mg (241.3 mg elemental) tablet  Commonly known as: MAG-OX  Take 2 tablets (800 mg total) by mouth Three (3) times a day.     melatonin 3 mg Tab  Take 1 tablet (3 mg total) by mouth every evening.     naltrexone 50 mg tablet  Commonly known as: DEPADE  Take 1 tablet (50 mg total) by mouth daily.     OLANZapine 20 MG tablet  Commonly known as: ZyPREXA  Take 1 tablet (20 mg total) by mouth nightly.     sodium chloride 1 gram tablet  Take 1 tablet (1 g total) by mouth Three (3) times a day with a meal.  Ask about: Which instructions should I use?     THERA-M Tab  Generic drug: multivitamin,tx-iron-minerals  Take 1 tablet by mouth daily.     thiamine mononitrate (vit B1) 100 mg Tab tablet  Take 1 tablet (100 mg total) by mouth daily.          Review of Systems:    Full 10 systems reviewed and negative, other than as noted in the HPI.    OBJECTIVE:     Vitals:  Temp:  [35.8 ??C-37.1 ??C] 35.8 ??C  Heart Rate:  [79-95] 95  SpO2 Pulse:  [95] 95  Resp:  [18-26] 18  BP: (142-152)/(82-84) 142/82  MAP (mmHg): [107-110] 110  FiO2 (%):  [28 %] 28 %  SpO2:  [93 %-96 %] 93 %      Physical Exam:  GEN: NAD sitting in bed  EYES: sclera anicteric, conjunctiva clear   HENT: NCAT, MMM, OP clear  NECK: trachea midline  RESP: CTAB, NWOB   CV: RRR, no m/r/g, ext no c/c/e, radial and DPP 2+ b/l  GI: abd soft, NTND, NABS   GU: Foley in place with clear-yellow urine  SKIN: no visible masses, lesions, rashes, ecchymoses, or lacerations  MSK: FROM in BUE and BLE, no notable contractures, no visible swelling or erythema over joints, joints NTTP  NEURO:   Mental Status: A&Ox3, attention intact can spell ???WORLD??? bkwds, naming intact, short and long term memory intact 3/3 on word recall, speech fluid and coherent, follows commands well and answers questions appropriately   Cranial Nerve: visual acuity 20/20 b/l, no visual fields deficits, PERRLA, EOMI, facial sensation intact V1-V3 b/l, full facial mvmts and symmetric smile, hearing grossly intact b/l, uvula ml, shoulder shrug full and equal, tongue ml no abnml mvmts   Sensory: BUE and BLE sensation intact to light touch   Motor:   - RUE 5/5 shoulder abd, 4+/5 EF, 4+/5 EE, 5/5 WE, 5/5 ADM  - LUE 5/5 shoulder abd, 5/5 EF, 5/5 EE, 5/5 WE, 5/5 ADM  - RLE 5/5 HF, 5/5 KE, 5/5 DF, 5/5 PF, 5/5 EHL  - LLE 5/5 HF, 5/5 KE, 5/5 DF, 5/5 PF, 5/5  EHL   Tone: within normal limits, no spasticity noted   Reflexes: tricep/bicep/BR/patellar/Achilles 2+ b/l and symmetric, Babinski negative, Hoffman negative   Cerebellar: no abnml or extraneous mvmts, FNF wnl no dysmetria, RAM upper and lower wnl, negative Rhomberg, no pronator drift   Gait: Posture wnl. Gait steady steps w/ base/leg swing/arm swing/turning wnl. Heel and toe walking wnl, tandem gait wnl.   PSYCH: mood euthymic, affect appropriate, thought process logical     Labs and Diagnostic Studies: Reviewed    Radiology Results: Reviewed    Sunday Shams, MD  PGY-1 - Physical Medicine & Rehabilitation

## 2020-07-21 LAB — PROTIME-INR
INR: 1.07
PROTIME: 12.5 s (ref 10.3–13.4)

## 2020-07-21 LAB — CBC W/ AUTO DIFF
BASOPHILS ABSOLUTE COUNT: 0.1 10*9/L (ref 0.0–0.1)
BASOPHILS RELATIVE PERCENT: 1.1 %
EOSINOPHILS ABSOLUTE COUNT: 0.2 10*9/L (ref 0.0–0.5)
EOSINOPHILS RELATIVE PERCENT: 2.6 %
HEMATOCRIT: 36 % — ABNORMAL LOW (ref 39.0–48.0)
HEMOGLOBIN: 12.8 g/dL — ABNORMAL LOW (ref 12.9–16.5)
LYMPHOCYTES ABSOLUTE COUNT: 1.9 10*9/L (ref 1.1–3.6)
LYMPHOCYTES RELATIVE PERCENT: 24 %
MEAN CORPUSCULAR HEMOGLOBIN CONC: 35.5 g/dL (ref 32.0–36.0)
MEAN CORPUSCULAR HEMOGLOBIN: 36.1 pg — ABNORMAL HIGH (ref 25.9–32.4)
MEAN CORPUSCULAR VOLUME: 101.8 fL — ABNORMAL HIGH (ref 77.6–95.7)
MEAN PLATELET VOLUME: 8.2 fL (ref 6.8–10.7)
MONOCYTES ABSOLUTE COUNT: 0.9 10*9/L — ABNORMAL HIGH (ref 0.3–0.8)
MONOCYTES RELATIVE PERCENT: 10.9 %
NEUTROPHILS ABSOLUTE COUNT: 4.8 10*9/L (ref 1.8–7.8)
NEUTROPHILS RELATIVE PERCENT: 61.4 %
NUCLEATED RED BLOOD CELLS: 0 /100{WBCs} (ref <=4)
PLATELET COUNT: 377 10*9/L (ref 150–450)
RED BLOOD CELL COUNT: 3.54 10*12/L — ABNORMAL LOW (ref 4.26–5.60)
RED CELL DISTRIBUTION WIDTH: 15.1 % (ref 12.2–15.2)
WBC ADJUSTED: 7.8 10*9/L (ref 3.6–11.2)

## 2020-07-21 LAB — BASIC METABOLIC PANEL
ANION GAP: 7 mmol/L (ref 5–14)
BLOOD UREA NITROGEN: 9 mg/dL (ref 9–23)
BUN / CREAT RATIO: 14
CALCIUM: 8.4 mg/dL — ABNORMAL LOW (ref 8.7–10.4)
CHLORIDE: 100 mmol/L (ref 98–107)
CO2: 25.9 mmol/L (ref 20.0–31.0)
CREATININE: 0.65 mg/dL
EGFR CKD-EPI (2021) MALE: >90 mL/min/{1.73_m2} (ref >=60)
GLUCOSE RANDOM: 97 mg/dL (ref 70–179)
POTASSIUM: 4.5 mmol/L (ref 3.4–4.8)
SODIUM: 133 mmol/L — ABNORMAL LOW (ref 135–145)

## 2020-07-21 LAB — PHOSPHORUS: PHOSPHORUS: 4.1 mg/dL (ref 2.4–5.1)

## 2020-07-21 LAB — MAGNESIUM: MAGNESIUM: 1.6 mg/dL (ref 1.6–2.6)

## 2020-07-21 MED ORDER — NICOTINE 21 MG/24 HR DAILY TRANSDERMAL PATCH
MEDICATED_PATCH | Freq: Every day | TRANSDERMAL | 0 refills | 14.00000 days | Status: CN
Start: 2020-07-21 — End: ?

## 2020-07-21 MED ORDER — FOLIC ACID 1 MG TABLET
ORAL_TABLET | Freq: Every day | ORAL | 11 refills | 30 days | Status: CN
Start: 2020-07-21 — End: 2021-07-21

## 2020-07-21 MED ORDER — MAGNESIUM OXIDE 400 MG (241.3 MG MAGNESIUM) TABLET
ORAL_TABLET | Freq: Every day | ORAL | 11 refills | 30 days | Status: CN
Start: 2020-07-21 — End: 2021-07-21

## 2020-07-21 MED ORDER — ENOXAPARIN 40 MG/0.4 ML SUBCUTANEOUS SYRINGE
SUBCUTANEOUS | 0 refills | 0 days | Status: CN
Start: 2020-07-21 — End: ?

## 2020-07-21 MED ADMIN — dilTIAZem (CARDIZEM CD) 24 hr capsule 360 mg: 360 mg | ORAL | @ 13:00:00

## 2020-07-21 MED ADMIN — OLANZapine (ZyPREXA) tablet 20 mg: 20 mg | ORAL | @ 02:00:00

## 2020-07-21 MED ADMIN — naltrexone (DEPADE) tablet 50 mg: 50 mg | ORAL | @ 13:00:00

## 2020-07-21 MED ADMIN — ibuprofen (MOTRIN) tablet 600 mg: 600 mg | ORAL | @ 17:00:00

## 2020-07-21 MED ADMIN — pantoprazole (PROTONIX) EC tablet 40 mg: 40 mg | ORAL | @ 13:00:00

## 2020-07-21 MED ADMIN — thiamine mononitrate (vit B1) tablet 100 mg: 100 mg | ORAL | @ 13:00:00

## 2020-07-21 MED ADMIN — sodium chloride tablet 1 g: 1 g | ORAL | @ 17:00:00

## 2020-07-21 MED ADMIN — sodium chloride tablet 1 g: 1 g | ORAL | @ 13:00:00

## 2020-07-21 MED ADMIN — magnesium oxide (MAG-OX) tablet 800 mg: 800 mg | ORAL | @ 13:00:00

## 2020-07-21 MED ADMIN — divalproex ER (DEPAKOTE ER) extended release 24 hr tablet 750 mg: 750 mg | ORAL | @ 02:00:00

## 2020-07-21 MED ADMIN — sodium chloride tablet 1 g: 1 g | ORAL | @ 23:00:00

## 2020-07-21 MED ADMIN — nicotine (NICODERM CQ) 21 mg/24 hr patch 2 patch: 2 | TRANSDERMAL | @ 13:00:00

## 2020-07-21 MED ADMIN — gabapentin (NEURONTIN) capsule 300 mg: 300 mg | ORAL | @ 02:00:00

## 2020-07-21 MED ADMIN — folic acid (FOLVITE) tablet 1 mg: 1 mg | ORAL | @ 13:00:00

## 2020-07-21 MED ADMIN — potassium chloride (KLOR-CON) packet 20 mEq: 20 meq | ORAL | @ 15:00:00

## 2020-07-21 MED ADMIN — guaiFENesin (ROBITUSSIN) oral syrup: 200 mg | ORAL | @ 13:00:00

## 2020-07-21 MED ADMIN — acetaminophen (TYLENOL) tablet 650 mg: 650 mg | ORAL | @ 15:00:00

## 2020-07-21 MED ADMIN — enoxaparin (LOVENOX) syringe 40 mg: 40 mg | SUBCUTANEOUS | @ 20:00:00

## 2020-07-21 MED ADMIN — melatonin tablet 3 mg: 3 mg | ORAL | @ 04:00:00

## 2020-07-21 MED ADMIN — gabapentin (NEURONTIN) capsule 300 mg: 300 mg | ORAL | @ 13:00:00
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
## 2020-07-21 NOTE — Unmapped (Signed)
No complaints this morning, fall precautions continue

## 2020-07-21 NOTE — Unmapped (Signed)
Physical Medicine and Rehabilitation  PMR Daily Progress Note [facility]    ASSESSMENT:   Nicholas Rush is a 55 y.o. male with PMH of HTN, bipolar I??disorder, alcohol use??disorder c/b withdrawal seizures,??recurrent??C??diff??infection, who presented s/p witnessed grand mal seizure for monitored alcohol withdrawal.  ??  Rehab Impairment Group Code [facility]): (Neurologic Conditions) 03.9 Other Neurologic  Etiology:Neurological Change secondary to acute alcohol withdrawal complicated by seizure  PLAN:     This patient is admitted to the Physical Medicine and Rehabilitation - Inpatient - B service, please page [phone] from 8am-5pm on weekdays for questions regarding this patient. After hours and on weekends please contact the 1st call resident pager   ??  REHAB:   - PT and OT to maximize functional status with mobility and ADLs as well as prevention of joint contracture.   - SLP for cognitive and swallow function.  - Neuropsych for higher level cognitive evaluation and coping.  - RT for community re-integration, education, and leisure support services.  - Nutrition consult for diet information/teaching.   - To be discussed in weekly Interdisciplinary Team Conference.  ??  Alcohol Use Disorder??-??hx complicated W/d  Patient with EtOH at 295 on admission. About 3 days into admission developed withdrawal symptoms including autonomic instability, tremor, hallucination, confusion, and agitation. He was given scheduled Ativan and had serial CIWA scoring with prn Ativan. During admission he was also treated with folate, IV thiamine, and home gabapentin. Discussed resources for sobriety and naltrexone.  - PO thiamine and folate daily  - zofran PRN  - melatonin at bedtime,??delirium precautions  -??INCREASE??gabapentin??from??200mg ??to 300 mg??BID (6/30)  - Naltrexone 50 mg daily  ??  Concern for Aspiration Pneumonia - Cough  On second day of withdrawal patient began to spike fevers. He was noted to have trouble with airway secretions and was producing purulent sputum. Started on Unasyn for empiric coverage of potential aspiration pneumonia. Fever seemed to track more with withdrawal process and patient appeared less infectious. He was transitioned to Augmentin to complete 5 days of antibiotics.   - Completed Abx course  -CXR 6/29 w/ improved consolidation  - Prn Robitussin syrup  - Incentive spirometry, chest physiotherapy  -??Regular diet per SLP recs  ??  Paroxysmal Afib  Patient entered into Afib with RVR on third day of withdrawal symptoms. Unable to take PO at the time, he was started on diltiazem drip until he could take his home diltiazem PO. Patient was consistently hypokalemic and hypomagnesemic; repleted daily. Transitioned back to home oral diltiazem from drip.  - diltiazem 360 mg daily  - Replete K >4, Mg >2   -??Reported loose stools decrease Mag  - Loperamide 4 mg   - Consider repeat C diff if continued??  ??  Diarrhea  Patient with several c diff infections in the past. Bouts of loose stools while inpatient at [facility]. 1BM recorded here. C diff negative, low concern for infectious cause.   -CTM  ??  Myotonic Clonic Seizure   Seizure at home most likely provoked seizure 2/2 alcohol or infection. Spot EEG during recent hospitalization not suggestive of underlying pathology. Neurology consult suggested that presentation was most consistent with provoked seizure and recommended titration of depakote to therapeutic level.   ??  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 Patient had psych admission where they attempted to switch to lithium, but patient did not tolerate. He has not been stable on antipsychotic monotherapy and has had reasonable stability with subtherapeutic  dose of VPA. He continued on home salt tabs.   - 1 g salt tab TID  - CTM  ????  Lower Extremity Edema, resolved  New onset edema without other HF symptoms. Differential includes new onset HF, renal dysfunction (primarily proteinuria), and portal hypertension. Bedside ECHO unremarkable. UPC unremarkable. RUQ [place] mostly unremarkable and without portal HTN, but liver dysfunction could be contributory factor to hypoalbuminemia. Improved with Lasix  -Continue Lasix   ??  Bipolar I Disorder: well-controlled on Depakote and Zyprexa. VPA subtherapeutic, but will not increase due to concern for SIADH and decreasing sodium level.   - continue zyprexa 20mg  at bedtime  ??  Tobacco use disorder: Patient currently smoking??2.5 PPD. SW provided education and option for outpatient follow up for tobacco cessation on 6/21.  - Nicotine replacement therapy with patch 42 mg daily, prn gum  ??  Daily Checklist  - Diet:Regular   - DVT:SQ enoxaparin   - GI ZOX:WRUE  - Code: Full          SUBJECTIVE:     Interval History: NAEON. Looking forward to initiating therapy. Initial eval today. Wean O2.     ROS: Pertinent review of system completed and findings noted above    OBJECTIVE:     Vital signs (last 24 hours):   Temp:  [36.8 ??C-36.9 ??C] 36.9 ??C  Heart Rate:  [72-76] 76  Resp:  [18] 18  BP: (136-138)/(78-83) 136/78  MAP (mmHg):  [96-100] 96  SpO2:  [95 %-98 %] 97 %  BMI (Calculated):  [24.64] 24.64    Intake/Output:  I/O last 3 completed shifts:  In: -   Out: 100 [Urine:100]    Physical Exam:    GEN: NAD sitting in bed w/ chest vest  EYES: sclera anicteric, conjunctiva clear   HENT: NCAT, MMM, OP clear  NECK: trachea midline  RESP: CTAB, NWOB   CV: RRR, no m/r/g, ext no c/c/e, radial and DPP 2+ b/l  GI: abd soft, NTND, NABS   GU: Foley in place with clear-yellow urine  SKIN: no visible masses, lesions, rashes, ecchymoses, or lacerations  MSK: FROM in BUE and BLE, no notable contractures, no visible swelling or erythema over joints, joints NTTP  NEURO: A&Ox3, 5/5 throughout for exception of 4+ R EF/EE.          PSYCH: mood euthymic, affect appropriate, thought process logical                 Medications:    Scheduled   ??? dilTIAZem (CARDIZEM CD) 24 hr capsule 360 mg Daily   ??? divalproex ER (DEPAKOTE ER) extended release 24 hr tablet 750 mg Nightly   ??? enoxaparin (LOVENOX) syringe 40 mg Q24H   ??? folic acid (FOLVITE) tablet 1 mg Daily   ??? gabapentin (NEURONTIN) capsule 300 mg BID   ??? magnesium oxide (MAG-OX) tablet 800 mg Daily   ??? melatonin tablet 3 mg QPM   ??? naltrexone (DEPADE) tablet 50 mg Daily   ??? nicotine (NICODERM CQ) 21 mg/24 hr patch 2 patch Daily   ??? OLANZapine (ZyPREXA) tablet 20 mg Nightly   ??? pantoprazole (PROTONIX) EC tablet 40 mg Daily   ??? potassium chloride (KLOR-CON) packet 30 mEq BID   ??? sodium chloride tablet 1 g 3xd Meals   ??? thiamine mononitrate (vit B1) tablet 100 mg Daily     PRN acetaminophen, 650 mg, Q6H PRN  calcium carbonate, 200 mg of elem calcium, TID PRN  fluticasone propionate, 1 spray, Daily PRN  guaiFENesin, 200 mg, Q4H PRN  guaiFENesin, 100 mg, Q4H PRN  loperamide, 4 mg, Q4H PRN  nicotine polacrilex, 2 mg, Q2H PRN  ondansetron, 4 mg, Q6H PRN  oxyCODONE, 5 mg, Q4H PRN   Or  oxyCODONE, 10 mg, Q4H PRN  simethicone, 80 mg, TID PRN      Continuous Infusions      Labs: I have reviewed the labs and studies from the last 24hrs.    CBC -   Recent Labs   Lab Units 07/21/20  0714 07/19/20  0536 07/18/20  0603   WBC 10*9/L 7.8 5.5 5.4   RBC 10*12/L 3.54* 3.56* 3.70*   HEMOGLOBIN g/dL 16.1* 09.6* 04.5   HEMATOCRIT % 36.0* 36.4* 38.0*   MCV fL 101.8* 102.3* 102.8*   MCH pg 36.1* 36.1* 35.7*   MCHC g/dL 40.9 81.1 91.4   RDW % 15.1 15.0 15.3*   PLATELET COUNT (1) 10*9/L 377 261 205   MPV fL 8.2 8.7 9.0     BMP -   Recent Labs   Lab Units 07/21/20  0715 07/20/20  0906 07/19/20  0536   SODIUM mmol/L 133* 133* 133*   POTASSIUM mmol/L 4.5 4.6 3.7   CHLORIDE mmol/L 100 100 100   CO2 mmol/L 25.9 23.7 23.9   BUN mg/dL 9 9 7*   CREATININE mg/dL 7.82 9.56 2.13*   GLUCOSE mg/dL 97 95 99       Radiology Results: Reviewed         Sunday Shams, MD  PGY1 PMR

## 2020-07-21 NOTE — Unmapped (Signed)
Patient alert and oriented x 4, no complaints at this time. Tolerated medications administered. Fall precautions ongoing and patient is compliant. Tolerated Chest vest and voiced that this is helping him. Condom cath placed per request at 23:45 pm.

## 2020-07-21 NOTE — Unmapped (Signed)
OCCUPATIONAL THERAPY  Evaluation (07/21/20 1258)    Patient Name:  Nicholas Rush       Medical Record Number: [id number]   Date of Birth: 1965-09-29  Sex: Male          OT Treatment Diagnosis:  who presented s/p witnessed grand mal seizure for monitored alcohol withdrawal. Pt with decrease endurance, strength, cognition, balance, shd AROM, functional mobility.    Assessment  Problem List: Impaired ADLs, Decreased cognition, Decreased endurance, Decreased mobility, Decreased range of motion, Decreased safety awareness, Decreased strength, Fall Risk, Impaired balance  Assessment: 55 yo male with PMH of HTN, bipolar disorder alcohol use disorder c/b withdrawal seizure, recurrent C diff infection, who presented s/p witnessed grand mal seizure for monitored alcohol withdrawal. Pt was educated on the role of OT in the acute inpt area. Pt was sitting up in w/c when therapist entered the room. Pt required min-mod A for dressing LB ( ie socks and shorts) using RW for sitting and standing from w/c. Pt was able to donn and doff shirt from w/c level with su A. Pt required CGA-min A for sit to stand using RW. Pt initially shakes when standing and then the shaking goes away. Pt required CGA-min A for toilet transfer using RW. Pt required SBA -CGA for sit to stand from toilet. Pt required min A for shaking when initially standing using RW. Pt then transferred from toilet to sink with CGA-min A using RW. Pt stood at sink and required min A for balance while brushing his teeth with su A only using RW. Pt then wiped his face with su A and min A for balance using RW standing at sink. Pt ambulated from bathroom to w/c with CGA-min A using RW. Pt required a rest break after ambulating from bathroom. Pt ambulated in room with CGA-min A using RW. Pt sat in the w/c and doffed his socks and applied lotion to this feet. Pt then required min-mod A for donning socks. Pt demonstrated problem-solving skills with donning his first sock and required max A for donning socks. Pt's sock was stuck to his toenails and pt was unable to figure out how to adjust his sock to work around his toenails. Pt was able to follow two and three steps directions with slow response but correct order. Pt demonstrated deficits in safety awareness and problem solving skills when asked questions about average daily of activities tasks. Pt required rest break during OT session. Pt demonstrated a decline in self care, functional mobility, balance, endurance, strength, AROM to both shoulders, and cognitive skills. Pt to benefit from acute inpt rehab to reach his maximum LOF to return home with family. Pt would benefit from 10 to 14 days in rehab. Pt may benefit from HHOT after AIR .  Today's Interventions: ADL retraining, Balance activities, Education - Patient, Education - Family / caregiver, Endurance activities, Functional cognition, Functional mobility, Positioning, Range of motion, Safety education, Transfer training, UE Strength / coordination exercise, Other  Today's Interventions: self care, balance, pt education, staff education, endurance, functional cognition, functional mobility, positioning, ROM, safety education, transfers, strength,    Activity Tolerance During Today's Session  Tolerated treatment well    Plan  Planned Frequency of Treatment:  1-2 hours per day for: 5-7 days per week       Planned Interventions:  ADL retraining, Balance activities, Adaptive equipment, Education - Patient, Education - Family / caregiver, Endurance activities, Functional cognition, Functional mobility, Safety education, Range of motion, Therapeutic exercise,  Transfer training, UE Strength / coordination exercise, Other    Post-Discharge Occupational Therapy Recommendations:   To be determined   OT DME Recommendations: None -        GOALS:   Patient and Family Goals: to be as individual as possible    IP Long Term Goal #1: Pt to reach maximum LOF in 2 weeks.       Short Term:  Pt to complete toilet transfer , toileting and clothes mgmt with mod I using RW.   Time Frame : 2 weeks  Pt to participate in showering assessment.   Time Frame : 2 weeks  Pt to complete dressing self with S using RW.   Time Frame : 2 weeks  Pt to stand at sink to complete 5 minutes of grooming tasks with mod I using RW.   Time Frame : 2 weeks           Prognosis:     Positive Indicators:     Barriers to Discharge: Endurance deficits, Severity of deficits, Functional strength deficits, Inability to safely perform ADLS, Decreased caregiver support, Gait instability, Impaired Balance    Subjective  Current Status Pt sitting up in w/c.  Prior Functional Status Pt reported he requires S for showering, I with dressing, I with grooming, using a RW to ambulate.       Services patient receives: OT    Patient / Caregiver reports: Charity fundraiser and pt agreeable to OT    Past Medical History:   Diagnosis Date   ??? Alcohol withdrawal syndrome, with delirium (CMS-HCC) 05/11/2012   ??? Anxiety    ??? Bipolar 1 disorder (CMS-HCC)    ??? Depressive disorder    ??? Erectile dysfunction    ??? Esophageal reflux    ??? History of pyloric stenosis    ??? Hypertension    ??? Insomnia    ??? Psoriasis     Dermatologist - Dr. Deloria Lair in [facility]   ??? Seizure (CMS-HCC) 02/20/2019   ??? Tobacco use     1ppd    Social History     Tobacco Use   ??? Smoking status: Current Every Day Smoker     Packs/day: 1.50     Years: 30.00     Pack years: 45.00     Types: Cigarettes   ??? Smokeless tobacco: Never Used   Substance Use Topics   ??? Alcohol use: Yes     Comment: 3 drinks a day.      Past Surgical History:   Procedure Laterality Date   ??? PYLOROMYOTOMY     ??? WISDOM TOOTH EXTRACTION      Family History   Problem Relation Age of Onset   ??? Bipolar disorder Mother    ??? Depression Sister    ??? Alcohol abuse Sister    ??? Alcohol abuse Maternal Aunt    ??? Melanoma Neg Hx    ??? Basal cell carcinoma Neg Hx    ??? Squamous cell carcinoma Neg Hx         Adderall [dextroamphetamine-amphetamine], Tramadol, and Hydroxyzine     Objective Findings  Precautions / Restrictions  Falls precautions, Seizure precautions, Aspiration precautions    Weight Bearing  Non-applicable    Required Braces or Orthoses  Non-applicable    Communication Preference  Verbal, Visual, Other    Pain  Pt reports pain level of 8-9/10 to back.    Equipment / Environment   Pt not wearing mask and OT and OTS masks  Living Situation  Living Environment: Mobile home  Lives With: Spouse, Family  Home Living: One level home, Ramped entrance, Tub/shower unit, Hand-held shower hose, Standard height toilet, Raised toilet seat with rails, Tub bench, Stairs to enter with rails  Rail placement (outside): Bilateral rails  Number of Stairs to Enter (outside): 5  Equipment available at home: [employer], Rollator, Tub bench     Cognition   Orientation Level:  Oriented x 4   Arousal/Alertness:  Appropriate responses to stimuli   Attention Span:      Memory:  Appears intact   Following Commands:  Follows all commands and directions without difficulty   Safety Judgment:  Decreased awareness of need for safety   Awareness of Errors:  Assistance required to identify errors made, Assistance required to correct errors made, Decreased awareness of need for safety   Problem Solving:  Assistance required to identify errors made, Assistance required to generate solutions, Assistance required to implement solutions   Comments:      Vision / Hearing   Vision: Wears glasses for reading only, Wears glasses for distance only     Hearing: No deficit identified         Hand Function:  Right Hand Function: Right hand grip strength, ROM and coordination WNL  Left Hand Function: Left hand grip strength, ROM and coordination WNL  Hand Dominance: Right    Skin Inspection:  Skin Inspection: Intact where visualized    ROM / Strength:  UE ROM/Strength: Left Impaired/Limited, Right Impaired/Limited  RUE Impairment: Reduced strength, Limited AROM (shoulder flexion AROM 90 degrees)  LUE Impairment: Reduced strength, Limited AROM (shoulder flexion AROM 90 degrees)  LE ROM/Strength: Left Impaired/Limited, Right Impaired/Limited  RLE Impairment: Reduced strength  LLE Impairment: Reduced strength  LE ROM/ Strength Comment: weakness and shakey initially when standing using RW.    Coordination:  Coordination: WFL    Sensation:  RUE Sensation: RUE intact  LUE Sensation: LUE impaired  LUE Sensation Impairment: Numbness (long standing)  RLE Sensation: RLE intact  LLE Sensation: LLE intact    Balance:  sitting balance static I  dynamic I    standing balance static CGA-min A   dynamic CGA-min A with slightly flexed posture    Functional Mobility  Transfer Assistance Needed: Yes  Transfers - Needs Assistance: Min assist, Contact Guard assist  Bed Mobility Assistance Needed:  (NT)      ADLs  ADLs: Needs assistance with ADLs  ADLs - Needs Assistance: LB dressing, UB dressing, Toileting, Grooming  Feeding - Needs Assistance: Set Up Assist  Grooming - Needs Assistance: Min assist (for balance standing at sink to brush teeth.)  Toileting - Needs Assistance: Min assist (using RW)  UB Dressing - Needs Assistance: Set Up Assist  LB Dressing - Needs Assistance: Min assist, Mod assist (using RW)  IADLs: NT      Vitals / Orthostatics  At Rest: NA  With Activity: NA  Orthostatics: NA      Medical Staff Made Aware: RN      Occupational Therapy Session Duration  OT Individual [mins]: 60         I attest that I have reviewed the above information.  Signed: Gladys Damme, OT  Filed 07/21/2020

## 2020-07-22 LAB — CBC W/ AUTO DIFF
BASOPHILS ABSOLUTE COUNT: 0.1 10*9/L (ref 0.0–0.1)
BASOPHILS RELATIVE PERCENT: 1.2 %
EOSINOPHILS ABSOLUTE COUNT: 0.2 10*9/L (ref 0.0–0.5)
EOSINOPHILS RELATIVE PERCENT: 1.8 %
HEMATOCRIT: 35.6 % — ABNORMAL LOW (ref 39.0–48.0)
HEMOGLOBIN: 12.6 g/dL — ABNORMAL LOW (ref 12.9–16.5)
LYMPHOCYTES ABSOLUTE COUNT: 1.9 10*9/L (ref 1.1–3.6)
LYMPHOCYTES RELATIVE PERCENT: 20.6 %
MEAN CORPUSCULAR HEMOGLOBIN CONC: 35.5 g/dL (ref 32.0–36.0)
MEAN CORPUSCULAR HEMOGLOBIN: 36.2 pg — ABNORMAL HIGH (ref 25.9–32.4)
MEAN CORPUSCULAR VOLUME: 102.2 fL — ABNORMAL HIGH (ref 77.6–95.7)
MEAN PLATELET VOLUME: 8 fL (ref 6.8–10.7)
MONOCYTES ABSOLUTE COUNT: 1.1 10*9/L — ABNORMAL HIGH (ref 0.3–0.8)
MONOCYTES RELATIVE PERCENT: 11.5 %
NEUTROPHILS ABSOLUTE COUNT: 6.1 10*9/L (ref 1.8–7.8)
NEUTROPHILS RELATIVE PERCENT: 64.9 %
NUCLEATED RED BLOOD CELLS: 0 /100{WBCs} (ref <=4)
PLATELET COUNT: 406 10*9/L (ref 150–450)
RED BLOOD CELL COUNT: 3.48 10*12/L — ABNORMAL LOW (ref 4.26–5.60)
RED CELL DISTRIBUTION WIDTH: 15.2 % (ref 12.2–15.2)
WBC ADJUSTED: 9.4 10*9/L (ref 3.6–11.2)

## 2020-07-22 LAB — BASIC METABOLIC PANEL
ANION GAP: 7 mmol/L (ref 5–14)
BLOOD UREA NITROGEN: 9 mg/dL (ref 9–23)
BUN / CREAT RATIO: 14
CALCIUM: 8.4 mg/dL — ABNORMAL LOW (ref 8.7–10.4)
CHLORIDE: 101 mmol/L (ref 98–107)
CO2: 26.3 mmol/L (ref 20.0–31.0)
CREATININE: 0.65 mg/dL
EGFR CKD-EPI (2021) MALE: >90 mL/min/{1.73_m2} (ref >=60)
GLUCOSE RANDOM: 86 mg/dL (ref 70–179)
POTASSIUM: 4.2 mmol/L (ref 3.4–4.8)
SODIUM: 134 mmol/L — ABNORMAL LOW (ref 135–145)

## 2020-07-22 LAB — MAGNESIUM: MAGNESIUM: 1.6 mg/dL (ref 1.6–2.6)

## 2020-07-22 MED ADMIN — divalproex ER (DEPAKOTE ER) extended release 24 hr tablet 750 mg: 750 mg | ORAL | @ 01:00:00

## 2020-07-22 MED ADMIN — potassium chloride (KLOR-CON) packet 20 mEq: 20 meq | ORAL | @ 13:00:00

## 2020-07-22 MED ADMIN — sodium chloride tablet 1 g: 1 g | ORAL | @ 13:00:00

## 2020-07-22 MED ADMIN — melatonin tablet 3 mg: 3 mg | ORAL | @ 02:00:00

## 2020-07-22 MED ADMIN — acetaminophen (TYLENOL) tablet 650 mg: 650 mg | ORAL | @ 13:00:00

## 2020-07-22 MED ADMIN — folic acid (FOLVITE) tablet 1 mg: 1 mg | ORAL | @ 13:00:00

## 2020-07-22 MED ADMIN — pantoprazole (PROTONIX) EC tablet 40 mg: 40 mg | ORAL | @ 13:00:00

## 2020-07-22 MED ADMIN — thiamine mononitrate (vit B1) tablet 100 mg: 100 mg | ORAL | @ 13:00:00

## 2020-07-22 MED ADMIN — gabapentin (NEURONTIN) capsule 300 mg: 300 mg | ORAL | @ 01:00:00

## 2020-07-22 MED ADMIN — naltrexone (DEPADE) tablet 50 mg: 50 mg | ORAL | @ 13:00:00

## 2020-07-22 MED ADMIN — magnesium oxide (MAG-OX) tablet 800 mg: 800 mg | ORAL | @ 13:00:00

## 2020-07-22 MED ADMIN — dilTIAZem (CARDIZEM CD) 24 hr capsule 360 mg: 360 mg | ORAL | @ 13:00:00

## 2020-07-22 MED ADMIN — nicotine (NICODERM CQ) 21 mg/24 hr patch 2 patch: 2 | TRANSDERMAL | @ 13:00:00

## 2020-07-22 MED ADMIN — OLANZapine (ZyPREXA) tablet 20 mg: 20 mg | ORAL | @ 01:00:00

## 2020-07-22 MED ADMIN — ibuprofen (MOTRIN) tablet 600 mg: 600 mg | ORAL | @ 13:00:00

## 2020-07-22 MED ADMIN — guaiFENesin (ROBITUSSIN) oral syrup: 200 mg | ORAL | @ 13:00:00

## 2020-07-22 MED ADMIN — gabapentin (NEURONTIN) capsule 300 mg: 300 mg | ORAL | @ 13:00:00

## 2020-07-22 MED ADMIN — sodium chloride tablet 1 g: 1 g | ORAL | @ 17:00:00

## 2020-07-22 NOTE — Unmapped (Signed)
Received pt lying comfortably in bed. Awake, alert and oriented x 4. No distress noted. Pt is cont/incont to bowel and bladder. Wears condom cath at bedtime. O2 saturation of 93 to 95 % in room air. Pt requested to wear oxygen at bedtime. O2 saturation of 95% to 97% on 0.5 L O2 via [place]. Chest Vest Therapy done at 2200. PIV on Left wrist, clean, dry intact. Due meds given. Skin injury prevention, fall, aspiration, delirium and seizure precaution observed. Needs attended. Made pt comfortable. No complaint noted. Will cont to monitor.        Problem: Rehabilitation (IRF) Plan of Care  Goal: Absence of New-Onset Illness or Injury  Intervention: Prevent Fall and Fall Injury  Recent Flowsheet Documentation  Taken 07/22/2020 0000 by Hale Drone, RN  Safety Interventions: fall reduction program maintained  Taken 07/21/2020 2200 by Hale Drone, RN  Safety Interventions: fall reduction program maintained  Taken 07/21/2020 2000 by Hale Drone, RN  Safety Interventions: (Delirium prec)   fall reduction program maintained   seizure precautions   other (comment)  Intervention: Prevent Infection  Recent Flowsheet Documentation  Taken 07/21/2020 2200 by Hale Drone, RN  Infection Prevention:   single patient room provided   rest/sleep promoted   hand hygiene promoted  Taken 07/21/2020 2000 by Hale Drone, RN  Infection Prevention:   single patient room provided   rest/sleep promoted   hand hygiene promoted  Intervention: Prevent VTE (Venous Thromboembolism)  Recent Flowsheet Documentation  Taken 07/21/2020 2100 by Hale Drone, RN  VTE Prevention/Management: anticoagulant therapy     Problem: Fall Injury Risk  Goal: Absence of Fall and Fall-Related Injury  Intervention: Promote Injury-Free Environment  Recent Flowsheet Documentation  Taken 07/22/2020 0000 by Hale Drone, RN  Safety Interventions: fall reduction program maintained  Taken 07/21/2020 2200 by Hale Drone, RN  Safety Interventions: fall reduction program maintained  Taken 07/21/2020 2000 by Hale Drone, RN  Safety Interventions: (Delirium prec)   fall reduction program maintained   seizure precautions   other (comment)     Problem: Seizure Disorder Comorbidity  Goal: Maintenance of Seizure Control  Intervention: Maintain Seizure-Symptom Control  Recent Flowsheet Documentation  Taken 07/21/2020 2000 by Hale Drone, RN  Seizure Precautions:   activity supervised   clutter-free environment maintained

## 2020-07-22 NOTE — Unmapped (Signed)
Physical Medicine and Rehabilitation  PMR Daily Progress Note [facility]    ASSESSMENT:   Nicholas Rush is a 55 y.o. male with PMH of HTN, bipolar I??disorder, alcohol use??disorder c/b withdrawal seizures,??recurrent??C??diff??infection, who presented s/p witnessed grand mal seizure for monitored alcohol withdrawal.  ??  Rehab Impairment Group Code [facility]): (Neurologic Conditions) 03.9 Other Neurologic  Etiology:Neurological Change secondary to acute alcohol withdrawal complicated by seizure  PLAN:     This patient is admitted to the Physical Medicine and Rehabilitation - Inpatient - B service, please page [phone] from 8am-5pm on weekdays for questions regarding this patient. After hours and on weekends please contact the 1st call resident pager   ??  REHAB:   - PT and OT to maximize functional status with mobility and ADLs as well as prevention of joint contracture.   - SLP for cognitive and swallow function.  - Neuropsych for higher level cognitive evaluation and coping.  - RT for community re-integration, education, and leisure support services.  - Nutrition consult for diet information/teaching.   - To be discussed in weekly Interdisciplinary Team Conference.  ??  Alcohol Use Disorder??-??hx complicated W/d  Patient with EtOH at 295 on admission. About 3 days into admission developed withdrawal symptoms including autonomic instability, tremor, hallucination, confusion, and agitation. He was given scheduled Ativan and had serial CIWA scoring with prn Ativan. During admission he was also treated with folate, IV thiamine, and home gabapentin. Discussed resources for sobriety and naltrexone.  - PO thiamine and folate daily  - zofran PRN  - melatonin at bedtime,??delirium precautions  -??INCREASE??gabapentin??from??200mg ??to 300 mg??BID (6/30)  - Naltrexone 50 mg daily  ??  Concern for Aspiration Pneumonia - Cough  On second day of withdrawal patient began to spike fevers. He was noted to have trouble with airway secretions and was producing purulent sputum. Started on Unasyn for empiric coverage of potential aspiration pneumonia. Fever seemed to track more with withdrawal process and patient appeared less infectious. He was transitioned to Augmentin to complete 5 days of antibiotics.   - Completed Abx course  -CXR 6/29 w/ improved consolidation  - Prn Robitussin syrup  - Incentive spirometry, chest physiotherapy  -??Regular diet per SLP recs  ??  Paroxysmal Afib  Patient entered into Afib with RVR on third day of withdrawal symptoms. Unable to take PO at the time, he was started on diltiazem drip until he could take his home diltiazem PO. Patient was consistently hypokalemic and hypomagnesemic; repleted daily. Transitioned back to home oral diltiazem from drip.  - diltiazem 360 mg daily  - Replete K >4, Mg >2   -??Reported loose stools decrease Mag  - Loperamide 4 mg   - Consider repeat C diff if continued??  ??  Diarrhea  Patient with several c diff infections in the past. Bouts of loose stools while inpatient at [facility]. 1BM recorded here. C diff negative, low concern for infectious cause.   -CTM  ??  Myotonic Clonic Seizure   Seizure at home most likely provoked seizure 2/2 alcohol or infection. Spot EEG during recent hospitalization not suggestive of underlying pathology. Neurology consult suggested that presentation was most consistent with provoked seizure and recommended titration of depakote to therapeutic level.   ??  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 Patient had psych admission where they attempted to switch to lithium, but patient did not tolerate. He has not been stable on antipsychotic monotherapy and has had reasonable stability with  subtherapeutic dose of VPA. He continued on home salt tabs.   - 1 g salt tab TID  - CTM  ????  Lower Extremity Edema, resolved  New onset edema without other HF symptoms. Differential includes new onset HF, renal dysfunction (primarily proteinuria), and portal hypertension. Bedside ECHO unremarkable. UPC unremarkable. RUQ [place] mostly unremarkable and without portal HTN, but liver dysfunction could be contributory factor to hypoalbuminemia. Improved with Lasix  -Continue Lasix   ??  Bipolar I Disorder: well-controlled on Depakote and Zyprexa. VPA subtherapeutic, but will not increase due to concern for SIADH and decreasing sodium level.   - continue zyprexa 20mg  at bedtime  ??  Tobacco use disorder: Patient currently smoking??2.5 PPD. SW provided education and option for outpatient follow up for tobacco cessation on 6/21.  - Nicotine replacement therapy with patch 42 mg daily, prn gum  ??  Daily Checklist  - Diet:Regular   - DVT:SQ enoxaparin   - GI ZOX:WRUE  - Code: Full          SUBJECTIVE:     Interval History: NAEON. On RA.     ROS: Pertinent review of system completed and findings noted above    OBJECTIVE:     Vital signs (last 24 hours):   Temp:  [36.6 ??C-36.7 ??C] 36.7 ??C  Heart Rate:  [66-70] 70  Resp:  [18] 18  BP: (105-155)/(64-87) 105/64  MAP (mmHg):  [78-107] 78  SpO2:  [96 %-99 %] 96 %    Intake/Output:  I/O last 3 completed shifts:  In: -   Out: 400 [Urine:400]    Physical Exam:    GEN: NAD sitting in bed post shower   EYES: sclera anicteric, conjunctiva clear   HENT: NCAT, MMM, OP clear  NECK: trachea midline  RESP: CTAB, NWOB   CV: RRR, no m/r/g, ext no c/c/e, radial and DPP 2+ b/l  GI: abd soft, NTND, NABS   GU: Foley in place with clear-yellow urine  SKIN: no visible masses, lesions, rashes, ecchymoses, or lacerations  MSK: FROM in BUE and BLE, no notable contractures, no visible swelling or erythema over joints, joints NTTP  NEURO: A&Ox3, 5/5 throughout for exception of 4+ R EF/EE.          PSYCH: mood euthymic, affect appropriate, thought process logical                 Medications:    Scheduled   ??? dilTIAZem (CARDIZEM CD) 24 hr capsule 360 mg Daily   ??? divalproex ER (DEPAKOTE ER) extended release 24 hr tablet 750 mg Nightly   ??? enoxaparin (LOVENOX) syringe 40 mg Q24H   ??? folic acid (FOLVITE) tablet 1 mg Daily   ??? gabapentin (NEURONTIN) capsule 300 mg BID   ??? magnesium oxide (MAG-OX) tablet 800 mg Daily   ??? melatonin tablet 3 mg QPM   ??? naltrexone (DEPADE) tablet 50 mg Daily   ??? nicotine (NICODERM CQ) 21 mg/24 hr patch 2 patch Daily   ??? OLANZapine (ZyPREXA) tablet 20 mg Nightly   ??? pantoprazole (PROTONIX) EC tablet 40 mg Daily   ??? potassium chloride (KLOR-CON) packet 20 mEq Daily   ??? sodium chloride tablet 1 g 3xd Meals   ??? thiamine mononitrate (vit B1) tablet 100 mg Daily     PRN acetaminophen, 650 mg, Q6H PRN  calcium carbonate, 200 mg of elem calcium, TID PRN  fluticasone propionate, 1 spray, Daily PRN  guaiFENesin, 200 mg, Q4H PRN  guaiFENesin, 100 mg, Q4H PRN  ibuprofen, 600 mg, Q6H PRN  loperamide, 4 mg, Q4H PRN  nicotine polacrilex, 2 mg, Q2H PRN  ondansetron, 4 mg, Q6H PRN  oxyCODONE, 5 mg, Q4H PRN   Or  oxyCODONE, 10 mg, Q4H PRN  simethicone, 80 mg, TID PRN      Continuous Infusions      Labs: I have reviewed the labs and studies from the last 24hrs.    CBC -   Recent Labs   Lab Units 07/22/20  0731 07/21/20  0714 07/19/20  0536   WBC 10*9/L 9.4 7.8 5.5   RBC 10*12/L 3.48* 3.54* 3.56*   HEMOGLOBIN g/dL 16.1* 09.6* 04.5*   HEMATOCRIT % 35.6* 36.0* 36.4*   MCV fL 102.2* 101.8* 102.3*   MCH pg 36.2* 36.1* 36.1*   MCHC g/dL 40.9 81.1 91.4   RDW % 15.2 15.1 15.0   PLATELET COUNT (1) 10*9/L 406 377 261   MPV fL 8.0 8.2 8.7     BMP -   Recent Labs   Lab Units 07/22/20  0731 07/21/20  0715 07/20/20  0906   SODIUM mmol/L 134* 133* 133*   POTASSIUM mmol/L 4.2 4.5 4.6   CHLORIDE mmol/L 101 100 100   CO2 mmol/L 26.3 25.9 23.7   BUN mg/dL 9 9 9    CREATININE mg/dL 7.82 9.56 2.13   GLUCOSE mg/dL 86 97 95       Radiology Results: Reviewed         Sunday Shams, MD  PGY-2 - Physical Medicine & Rehabilitation

## 2020-07-22 NOTE — Unmapped (Signed)
Adult Nutrition Progress Note    Visit Type: Follow-Up  Reason for Visit: PO Intake      Nutrition Progress:  Patient reports consuming 50% of average meals and consuming 2-3 Ensure supplements per day     HPI & PMH:  Per MD note, patient is a 55 y.o. male with a past medical history significant for HTN, bipolar I??disorder, alcohol use??disorder c/b withdrawal seizures,??recurrent??C??diff??infection??who presents s/p witnessed grand Mal seizure for monitored alcohol withdrawal and is found to have new onset LEE.       Anthropometric Data:  Height: 188 cm (6' 2)   Admission weight: 87.1 kg (192 lb 0.3 oz)  Last recorded weight: 87.1 kg (192 lb 0.3 oz)  IBW: 86.23 kg  Percent IBW: 101.01 %  BMI: Body mass index is 24.65 kg/m??.   Usual Body Weight: Patient reports 120 lb, suspect he meant 220 lb- per RD note from previous admission patient stated 200#    Weight history prior to admission: no recent loss noted   Wt Readings from Last 10 Encounters:   07/20/20 87.1 kg (192 lb 0.3 oz)   07/10/20 91 kg (200 lb 9.6 oz)   05/20/20 89.6 kg (197 lb 8.5 oz)   03/25/20 89.6 kg (197 lb 8 oz)   03/19/20 89.4 kg (197 lb 1.5 oz)   03/09/20 90.4 kg (199 lb 3 oz)   02/23/20 83.9 kg (184 lb 15.5 oz)   01/17/20 83.9 kg (185 lb)   01/15/20 86.6 kg (191 lb)   12/13/19 84.7 kg (186 lb 11.2 oz)        Weight changes this admission:   Last 5 Recorded Weights    07/20/20 1523   Weight: 87.1 kg (192 lb 0.3 oz)        Nutrition Focused Physical Exam:           NUTRITIONALLY RELEVANT DATA     Medications:   Nutritionally pertinent medications reviewed and evaluated for potential food and/or medication interactions.  Folvite, Mag-ox, Klor-con, Vitamin B1  Labs:   Nutritionally pertinent labs reviewed and include Na: 134 mmol/L    Nutrition History:   06/28: Last documented meal intakes starting with today, since 06/26: 25/75/50/100/0/0/0/100/50. RNs reported patient has consumed 25% of meals today, 25% of of one Ensure today. Nutr hx obtained. Patient reports good PO intake with no weight changes PTA. Patient reports consuming one Ensure daily, and two per day at home. Encouraged patient to consume as much of his meals as he can and to use Ensure as a supplement. RN suspects patient may need feeding assistance    July 13, 2020: Prior to admission: RD attempted to speak with patient today.  RN present.  Patient unable to be roused with significant secretions noted.  RN reports patient likes orange juice and ate ~25% of breakfast.    Allergies, Intolerances, Sensitivities, and/or Cultural/Religious Dietary Restrictions: none identified at this time     Current Nutrition:  Oral intake        Nutrition Orders   (From admission, onward)             Start     Ordered    07/20/20 1700  Supplement Adult; Ensure Enlive (High Calorie/High Protein); # of Products PER Serving: 1 2xd Meals  2 times daily with meals      Question Answer Comment   Supplement (INP): Adult    Select Supplement Ensure Enlive (High Calorie/High Protein)    # of Products PER Serving  1        07/20/20 1518    07/20/20 1519  Nutrition Therapy Regular/House  Effective now        Question:  Nutrition Therapy:  Answer:  Regular/House    07/20/20 1518                   Nutritional Needs:     Healthy balance of carbohydrate, protein, and fat.           Malnutrition Assessment using AND/ASPEN Clinical Characteristics:         GOALS and EVALUATION     ??? Patient to meet 75% or greater of nutritional needs via combination of meals, snacks, and/or oral supplements within 3-5.  - New    Motivation, Barriers, and Compliance:  Evaluation of motivation, barriers, and compliance pending at this time due to clinical status.     NUTRITION ASSESSMENT     ??? Patient would benefit from continuing oral supplement to better meet nutritional needs.   ???       Discharge Planning:   Monitor via CAPP rounds for any discharge planning needs.      NUTRITION INTERVENTIONS and RECOMMENDATION     1. Continue regular diet  2. Recommend continuing Ensure Enlive BID  3. Monitor po intake and record % meals in Epic. Calorie counts would be helpful  4. Weights 2x/wk     Follow-Up Parameters:   1-2 times per 4 week period (and more frequent as indicated)    Alesia Morin, RD, LDN

## 2020-07-22 NOTE — Unmapped (Signed)
A&Ox4. Continent of bowel and bladder. Pain managed with prn pain meds. Bed in low position and call bell in reach. Will continue to monitor and follow care plan.     Problem: Rehabilitation (IRF) Plan of Care  Goal: Plan of Care Review  Outcome: Progressing  Goal: Patient-Specific Goal (Individualized)  Outcome: Progressing  Goal: Absence of New-Onset Illness or Injury  Outcome: Progressing  Intervention: Prevent Fall and Fall Injury  Recent Flowsheet Documentation  Taken 07/21/2020 1800 by Sherren Mocha, RN  Safety Interventions: fall reduction program maintained  Taken 07/21/2020 1600 by Sherren Mocha, RN  Safety Interventions: fall reduction program maintained  Taken 07/21/2020 1200 by Sherren Mocha, RN  Safety Interventions: fall reduction program maintained  Taken 07/21/2020 1000 by Sherren Mocha, RN  Safety Interventions: fall reduction program maintained  Taken 07/21/2020 0800 by Sherren Mocha, RN  Safety Interventions: fall reduction program maintained  Intervention: Prevent VTE (Venous Thromboembolism)  Recent Flowsheet Documentation  Taken 07/21/2020 0900 by Sherren Mocha, RN  VTE Prevention/Management: anticoagulant therapy  Goal: Optimal Comfort and Wellbeing  Outcome: Progressing  Goal: Home and Community Transition Plan Established  Outcome: Progressing     Problem: Self-Care Deficit  Goal: Improved Ability to Complete Activities of Daily Living  Outcome: Progressing     Problem: Fall Injury Risk  Goal: Absence of Fall and Fall-Related Injury  Outcome: Progressing  Intervention: Promote Injury-Free Environment  Recent Flowsheet Documentation  Taken 07/21/2020 1800 by Sherren Mocha, RN  Safety Interventions: fall reduction program maintained  Taken 07/21/2020 1600 by Sherren Mocha, RN  Safety Interventions: fall reduction program maintained  Taken 07/21/2020 1200 by Sherren Mocha, RN  Safety Interventions: fall reduction program maintained  Taken 07/21/2020 1000 by Sherren Mocha, RN  Safety Interventions: fall reduction program maintained  Taken 07/21/2020 0800 by Sherren Mocha, RN  Safety Interventions: fall reduction program maintained     Problem: Hypertension Comorbidity  Goal: Blood Pressure in Desired Range  Outcome: Progressing     Problem: Seizure Disorder Comorbidity  Goal: Maintenance of Seizure Control  Outcome: Progressing

## 2020-07-22 NOTE — Unmapped (Signed)
Speech Language Pathology Clinical Swallow Assessment  Evaluation (07/22/20 1400)    Patient Name:  Nicholas Rush       Medical Record Number: [id number]   Date of Birth: [birth year]  Sex: Male            SLP Treatment Diagnosis: r/o oropharyngeal dysphagia  Activity Tolerance: Patient tolerated treatment well    Assessment  Pt seen for clinical swallow evaluation. Pt presents with clinical indications of oropharyngeal swallow function that is within normal limits. Pt tolerated PO trials of thin liquids, regular solids, and mixed consistencies without overt s/sx of aspiration. Functional mastication without oral residue observed. Pt reports tolerating regular diet without difficulty. Recommend continue regular diet with general aspiration precautions: small bites/sips, upright positioning, eat only when awake and alert. No further acute SLP services indicated at this time. SLP to sign off; please reconsult with acute changes.     Risk for Aspiration: None     Recommendations:         Diet Liquids Recommendations: No Restrictions    Diet Solids Recommendation: No Restrictions    Recommended Form of Medications: Whole, With liquid, With puree (per pt preference)      Compensatory Swallowing Strategies: Upright as possible for all oral intake, Eat/feed slowly, Small bites/sips    Post Acute Discharge Recommendations  Post Acute SLP Discharge Recommendations: SLP services not indicated    Prognosis: Good  Positive Indicators: + tolerating regular diet  Barriers to Discharge: Cognitive deficits, Decreased range of motion, Decreased safety awareness, Endurance deficits, Functional strength deficits, Gait instability, Impaired Balance, Inability to safely perform ADLS     Plan of Care  SLP Follow-up / Frequency: D/C Services, D/C Services      Treatment Goals:                                                             Patient and Family Goal: Continue eating/drinking normally    Subjective  Current Functional Status: Nicholas Rush is a 55 y.o. male with PMH of HTN, bipolar I disorder, alcohol use disorder c/b withdrawal seizures, recurrent C diff infection, who presented s/p witnessed grand mal seizure for monitored alcohol withdrawal. Pt currently on regular diet. Seen at bedside for evaluation.  Prior Function: Independent prior to admission   Lives With: Spouse              Communication Preference: Verbal  Patient/Caregiver Reports: Pt denies difficulty eating/drinking     Allergies: Adderall [dextroamphetamine-amphetamine], Tramadol, and Hydroxyzine  Current Facility-Administered Medications   Medication Dose Route Frequency Provider Last Rate Last Admin   ??? acetaminophen (TYLENOL) tablet 650 mg  650 mg Oral Q6H PRN Jorge Mandril, MD   650 mg at 07/22/20 0900   ??? calcium carbonate (TUMS) chewable tablet 200 mg of elem calcium  200 mg of elem calcium Oral TID PRN Jorge Mandril, MD       ??? dilTIAZem (CARDIZEM CD) 24 hr capsule 360 mg  360 mg Oral Daily Jorge Mandril, MD   360 mg at 07/22/20 0844   ??? divalproex ER (DEPAKOTE ER) extended release 24 hr tablet 750 mg  750 mg Oral Nightly Jorge Mandril, MD   750 mg at 07/21/20 2100   ??? enoxaparin (LOVENOX)  syringe 40 mg  40 mg Subcutaneous Q24H Jorge Mandril, MD   40 mg at 07/21/20 1622   ??? fluticasone propionate (FLONASE) 50 mcg/actuation nasal spray 1 spray  1 spray Each Nare Daily PRN Jorge Mandril, MD       ??? folic acid (FOLVITE) tablet 1 mg  1 mg Oral Daily Jorge Mandril, MD   1 mg at 07/22/20 0844   ??? gabapentin (NEURONTIN) capsule 300 mg  300 mg Oral BID Jorge Mandril, MD   300 mg at 07/22/20 0844   ??? guaiFENesin (ROBITUSSIN) oral syrup  200 mg Oral Q4H PRN Jorge Mandril, MD   200 mg at 07/22/20 0844   ??? guaiFENesin (ROBITUSSIN) oral syrup  100 mg Oral Q4H PRN Jorge Mandril, MD       ??? ibuprofen (MOTRIN) tablet 600 mg  600 mg Oral Q6H PRN Jorge Mandril, MD   600 mg at 07/22/20 0900   ??? loperamide (IMODIUM) capsule 4 mg  4 mg Oral Q4H PRN Jorge Mandril, MD       ??? magnesium oxide (MAG-OX) tablet 800 mg  800 mg Oral Daily Jorge Mandril, MD   800 mg at 07/22/20 0844   ??? melatonin tablet 3 mg  3 mg Oral QPM Jorge Mandril, MD   3 mg at 07/21/20 2215   ??? naltrexone (DEPADE) tablet 50 mg  50 mg Oral Daily Jorge Mandril, MD   50 mg at 07/22/20 0844   ??? nicotine (NICODERM CQ) 21 mg/24 hr patch 2 patch  2 patch Transdermal Daily Jorge Mandril, MD   2 patch at 07/22/20 0846   ??? nicotine polacrilex (NICORETTE) gum 2 mg  2 mg Buccal Q2H PRN Jorge Mandril, MD       ??? OLANZapine (ZyPREXA) tablet 20 mg  20 mg Oral Nightly Jorge Mandril, MD   20 mg at 07/21/20 2100   ??? ondansetron (ZOFRAN-ODT) disintegrating tablet 4 mg  4 mg Oral Q6H PRN Jorge Mandril, MD       ??? oxyCODONE (ROXICODONE) immediate release tablet 5 mg  5 mg Oral Q4H PRN Jorge Mandril, MD        Or   ??? oxyCODONE (ROXICODONE) immediate release tablet 10 mg  10 mg Oral Q4H PRN Jorge Mandril, MD       ??? pantoprazole (PROTONIX) EC tablet 40 mg  40 mg Oral Daily Jorge Mandril, MD   40 mg at 07/22/20 0844   ??? potassium chloride (KLOR-CON) packet 20 mEq  20 mEq Oral Daily Jorge Mandril, MD   20 mEq at 07/22/20 0844   ??? simethicone (MYLICON) chewable tablet 80 mg  80 mg Oral TID PRN Jorge Mandril, MD       ??? sodium chloride tablet 1 g  1 g Oral 3xd Meals Jorge Mandril, MD   1 g at 07/22/20 1302   ??? thiamine mononitrate (vit B1) tablet 100 mg  100 mg Oral Daily Jorge Mandril, MD   100 mg at 07/22/20 0844     Past Medical History:   Diagnosis Date   ??? Alcohol withdrawal syndrome, with delirium (CMS-HCC) 05/11/2012   ??? Anxiety    ??? Bipolar 1 disorder (CMS-HCC)    ??? Depressive disorder    ??? Erectile dysfunction    ??? Esophageal reflux    ??? History of pyloric stenosis    ??? Hypertension    ???  Insomnia    ??? Psoriasis     Dermatologist - Dr. Deloria Lair in [facility]   ??? Seizure (CMS-HCC) 02/20/2019   ??? Tobacco use     1ppd     Family History   Problem Relation Age of Onset   ??? Bipolar disorder Mother    ??? Depression Sister    ??? Alcohol abuse Sister    ??? Alcohol abuse Maternal Aunt    ??? Melanoma Neg Hx    ??? Basal cell carcinoma Neg Hx    ??? Squamous cell carcinoma Neg Hx      Past Surgical History:   Procedure Laterality Date   ??? PYLOROMYOTOMY     ??? WISDOM TOOTH EXTRACTION       Social History     Tobacco Use   ??? Smoking status: Current Every Day Smoker     Packs/day: 1.50     Years: 30.00     Pack years: 45.00     Types: Cigarettes   ??? Smokeless tobacco: Never Used   Substance Use Topics   ??? Alcohol use: Yes     Comment: 3 drinks a day.         General:                    Vision: Functional for self-feeding                   Medical Tests / Procedures Comments: CXR 6/29:   Improving left basilar opacities. No new focal consolidation.  Equipment/Environment: Patient not wearing mask for full session, Other (SLP wore mask and eye shield t/o)  Services patient receives: OT, PT      Objective  Temperature Spikes Noted: N/A  Respiratory Status : Room air  History of Intubation: No          Behavior/Cognition: Alert, Cooperative, Pleasant mood  Positioning : Upright in bed    Oral / Motor Exam  Vocal Quality: Normal  Volitional Swallow: Within Functional Limits   Labial ROM: Within Functional Limits   Labial Symmetry: Within Functional Limits  Labial Strength: Within Functional Limits   Lingual ROM: Other (Comment)  Lingual Symmetry: Within Functional Limits  Lingual Strength: Within Functional Limits          Mandible: Within Functional Limits  Coordination: intact      Facial Symmetry: Within Functional Limits  Facial Strength: Within Functional Limits  Facial Sensation: Within Functional Limits   Vocal Intensity: Within Functional Limits   Gag: Unable to assess   Apraxia: None present   Dysarthria: None present   Intelligibility: Intelligible   Breath Support: Adequate for speech   Dentition: Adequate    Consistencies assessed: thin liquids, regular solids, mixed consistencies         Speech Therapy Session Duration  SLP Individual [mins]: 11    I attest that I have reviewed the above information.  Signed: Royston Cowper, SLP    Ceasar Mons 07/22/2020

## 2020-07-23 LAB — CBC W/ AUTO DIFF
BASOPHILS ABSOLUTE COUNT: 0.1 10*9/L (ref 0.0–0.1)
BASOPHILS RELATIVE PERCENT: 0.6 %
EOSINOPHILS ABSOLUTE COUNT: 0.2 10*9/L (ref 0.0–0.5)
EOSINOPHILS RELATIVE PERCENT: 2.2 %
HEMATOCRIT: 36.4 % — ABNORMAL LOW (ref 39.0–48.0)
HEMOGLOBIN: 12.8 g/dL — ABNORMAL LOW (ref 12.9–16.5)
LYMPHOCYTES ABSOLUTE COUNT: 1.6 10*9/L (ref 1.1–3.6)
LYMPHOCYTES RELATIVE PERCENT: 20 %
MEAN CORPUSCULAR HEMOGLOBIN CONC: 35 g/dL (ref 32.0–36.0)
MEAN CORPUSCULAR HEMOGLOBIN: 36.5 pg — ABNORMAL HIGH (ref 25.9–32.4)
MEAN CORPUSCULAR VOLUME: 104.4 fL — ABNORMAL HIGH (ref 77.6–95.7)
MEAN PLATELET VOLUME: 8.1 fL (ref 6.8–10.7)
MONOCYTES ABSOLUTE COUNT: 0.9 10*9/L — ABNORMAL HIGH (ref 0.3–0.8)
MONOCYTES RELATIVE PERCENT: 11.1 %
NEUTROPHILS ABSOLUTE COUNT: 5.3 10*9/L (ref 1.8–7.8)
NEUTROPHILS RELATIVE PERCENT: 66.1 %
NUCLEATED RED BLOOD CELLS: 0 /100{WBCs} (ref <=4)
PLATELET COUNT: 403 10*9/L (ref 150–450)
RED BLOOD CELL COUNT: 3.49 10*12/L — ABNORMAL LOW (ref 4.26–5.60)
RED CELL DISTRIBUTION WIDTH: 14.8 % (ref 12.2–15.2)
WBC ADJUSTED: 8 10*9/L (ref 3.6–11.2)

## 2020-07-23 LAB — BASIC METABOLIC PANEL
ANION GAP: 7 mmol/L (ref 5–14)
BLOOD UREA NITROGEN: 9 mg/dL (ref 9–23)
BUN / CREAT RATIO: 14
CALCIUM: 8.6 mg/dL — ABNORMAL LOW (ref 8.7–10.4)
CHLORIDE: 100 mmol/L (ref 98–107)
CO2: 26.9 mmol/L (ref 20.0–31.0)
CREATININE: 0.64 mg/dL
EGFR CKD-EPI (2021) MALE: >90 mL/min/{1.73_m2} (ref >=60)
GLUCOSE RANDOM: 90 mg/dL (ref 70–179)
POTASSIUM: 4.3 mmol/L (ref 3.4–4.8)
SODIUM: 134 mmol/L — ABNORMAL LOW (ref 135–145)

## 2020-07-23 LAB — SLIDE REVIEW

## 2020-07-23 LAB — MAGNESIUM: MAGNESIUM: 1.6 mg/dL (ref 1.6–2.6)

## 2020-07-23 MED ADMIN — dilTIAZem (CARDIZEM CD) 24 hr capsule 360 mg: 360 mg | ORAL | @ 14:00:00

## 2020-07-23 MED ADMIN — folic acid (FOLVITE) tablet 1 mg: 1 mg | ORAL | @ 14:00:00

## 2020-07-23 MED ADMIN — potassium chloride (KLOR-CON) packet 20 mEq: 20 meq | ORAL | @ 14:00:00

## 2020-07-23 MED ADMIN — melatonin tablet 3 mg: 3 mg | ORAL | @ 22:00:00

## 2020-07-23 MED ADMIN — nicotine polacrilex (NICORETTE) gum 2 mg: 2 mg | BUCCAL | @ 18:00:00

## 2020-07-23 MED ADMIN — divalproex ER (DEPAKOTE ER) extended release 24 hr tablet 750 mg: 750 mg | ORAL | @ 01:00:00

## 2020-07-23 MED ADMIN — thiamine mononitrate (vit B1) tablet 100 mg: 100 mg | ORAL | @ 14:00:00

## 2020-07-23 MED ADMIN — acetaminophen (TYLENOL) tablet 650 mg: 650 mg | ORAL | @ 18:00:00

## 2020-07-23 MED ADMIN — naltrexone (DEPADE) tablet 50 mg: 50 mg | ORAL | @ 14:00:00

## 2020-07-23 MED ADMIN — ibuprofen (MOTRIN) tablet 600 mg: 600 mg | ORAL | @ 18:00:00

## 2020-07-23 MED ADMIN — guaiFENesin (ROBITUSSIN) oral syrup: 200 mg | ORAL | @ 19:00:00

## 2020-07-23 MED ADMIN — gabapentin (NEURONTIN) capsule 300 mg: 300 mg | ORAL | @ 01:00:00

## 2020-07-23 MED ADMIN — melatonin tablet 3 mg: 3 mg | ORAL | @ 01:00:00

## 2020-07-23 MED ADMIN — lidocaine (LIDODERM) 5 % patch 1 patch: 1 | TRANSDERMAL | @ 19:00:00

## 2020-07-23 MED ADMIN — nicotine (NICODERM CQ) 21 mg/24 hr patch 2 patch: 2 | TRANSDERMAL | @ 14:00:00

## 2020-07-23 MED ADMIN — OLANZapine (ZyPREXA) tablet 20 mg: 20 mg | ORAL | @ 01:00:00

## 2020-07-23 MED ADMIN — sodium chloride tablet 1 g: 1 g | ORAL | @ 22:00:00

## 2020-07-23 MED ADMIN — enoxaparin (LOVENOX) syringe 40 mg: 40 mg | SUBCUTANEOUS | @ 21:00:00

## 2020-07-23 MED ADMIN — polyethylene glycol (MIRALAX) packet 17 g: 17 g | ORAL | @ 01:00:00 | Stop: 2020-07-22

## 2020-07-23 MED ADMIN — pantoprazole (PROTONIX) EC tablet 40 mg: 40 mg | ORAL | @ 14:00:00

## 2020-07-23 MED ADMIN — magnesium oxide (MAG-OX) tablet 800 mg: 800 mg | ORAL | @ 14:00:00

## 2020-07-23 MED ADMIN — sodium chloride tablet 1 g: 1 g | ORAL | @ 17:00:00

## 2020-07-23 MED ADMIN — sodium chloride tablet 1 g: 1 g | ORAL | @ 14:00:00

## 2020-07-23 MED ADMIN — gabapentin (NEURONTIN) capsule 300 mg: 300 mg | ORAL | @ 14:00:00

## 2020-07-23 NOTE — Unmapped (Signed)
The patient's respiratory status this shift has been stable .     The patient is currently on room air.    The patients cough has been non-productive.    Additional interventions will occur as needed.    MD approved for patient too use home unit. Unit not in the room at this time

## 2020-07-23 NOTE — Unmapped (Signed)
**Note De-Identified Kylle Lall Obfuscation** No falls or injuries this shift. Pain under control with current medications. Chest vest performed x 2 this shift. Continent of bladder during the day, condom catheter at night. Last bm 7/01, will receive miralax tonight. Will continue to monitor.

## 2020-07-23 NOTE — Unmapped (Signed)
Rehab Individualized Overall Plan of Care [facility]  07/23/2020 11:51 AM   Patient Name: Nicholas Rush   Medical Record Number: [id number]   Date of Birth: 30-Jun-1965   Sex: Male   Room/Bed: 4O962/9B284-13     Primary Impairment Group:   Neurologic Conditions:  03.9  Other Neurologic Disorder   Admit Date/Time: 07/20/2020  3:15 PM     Physician Summary   Nicholas Rush will undergo inpatient rehabilitation to manage complex medical and rehabilitation needs related to Alcohol Withdrawal and Deconditioning.    The patient will receive interdisciplinary care, including daily physician management for the following:   Altered Mental Status, Impaired sleep/wake cycles, Monitoring for medication side effects and Nutrition     The patient will benefit from continued services by:   Physical Therapy, Occupational Therapy, Speech Therapy, Recreational Therapy and Neuropsychology     Rehab nursing is required to help manage the patient???s complex nursing needs related to their documented medical conditions.     The patient's medical prognosis is Good to achieve the stated goals below and to be able to be discharged home with family assistance or supervision within the estimated length of stay of 7-14 days.     Goals:    Patient / Family Goals   ADL: to be as individual as possible  Mobility: patient expressing goals to be as IND as possible and to walk without an AD  Cognition / Communication:    Community Reintegration:      FIM Discharge Goals         PT DME Recommendations:  (TBD)  Plan of Care Initiated: 07/21/20  PT Follow-up / Frequency: 1-2 hours per day,  for: 5-7 days per week  Planned Treatment Duration:  (10-12 days)   Planned Interventions: Airway Clearance, Diaphragmatic / Pursed-lip breathing, Balance activities, Education - Patient, Education - Family / caregiver, Home exercise program, Gait training, Functional mobility, Endurance activities, Therapeutic exercise, Therapeutic activity, Transfer training, Stair training, Positioning, Self-care / Home training, Postural re-education, Neuromuscular re-education    OT Follow-up / Frequency: 1-2 hours per day  for: 5-7 days per week  Planned Interventions: ADL retraining, Balance activities, Adaptive equipment, Education - Patient, Education - Family / caregiver, Endurance activities, Functional cognition, Functional mobility, Safety education, Range of motion, Therapeutic exercise, Transfer training, UE Strength / coordination exercise, Other  OT DME Recommendations: None    SLP Follow-up / Frequency: D/C Services  For: D/C Services     Post Acute SLP Discharge Recommendations: SLP services not indicated      Sunday Shams, MD  PGY-2 - Physical Medicine & Rehabilitation

## 2020-07-23 NOTE — Unmapped (Signed)
Physical Medicine and Rehabilitation  PMR Daily Progress Note [facility]    ASSESSMENT:   Nicholas Rush is a 55 y.o. male with PMH of HTN, bipolar I??disorder, alcohol use??disorder c/b withdrawal seizures,??recurrent??C??diff??infection, who presented s/p witnessed grand mal seizure for monitored alcohol withdrawal.  ??  Rehab Impairment Group Code [facility]): (Neurologic Conditions) 03.9 Other Neurologic  Etiology:Neurological Change secondary to acute alcohol withdrawal complicated by seizure  PLAN:     This patient is admitted to the Physical Medicine and Rehabilitation - Inpatient - B service, please page [phone] from 8am-5pm on weekdays for questions regarding this patient. After hours and on weekends please contact the 1st call resident pager   ??  REHAB:   - PT and OT to maximize functional status with mobility and ADLs as well as prevention of joint contracture.   - SLP for cognitive and swallow function.  - Neuropsych for higher level cognitive evaluation and coping.  - RT for community re-integration, education, and leisure support services.  - Nutrition consult for diet information/teaching.   - Discussed in weekly Interdisciplinary Team Conference set for 7/14  ??  Alcohol Use Disorder??-??hx complicated W/d  Patient with EtOH at 295 on admission. About 3 days into admission developed withdrawal symptoms including autonomic instability, tremor, hallucination, confusion, and agitation. He was given scheduled Ativan and had serial CIWA scoring with prn Ativan. During admission he was also treated with folate, IV thiamine, and home gabapentin. Discussed resources for sobriety and naltrexone.  - PO thiamine and folate daily  - zofran PRN  - melatonin at bedtime,??delirium precautions  -??Maintain gabapentin 300 mg??BID (6/30)  - Naltrexone 50 mg daily  ??  Concern for Aspiration Pneumonia - Cough  On second day of withdrawal patient began to spike fevers. He was noted to have trouble with airway secretions and was producing purulent sputum. Started on Unasyn for empiric coverage of potential aspiration pneumonia. Fever seemed to track more with withdrawal process and patient appeared less infectious. He was transitioned to Augmentin to complete 5 days of antibiotics.   - Completed Abx course  -CXR 6/29 w/ improved consolidation  - Prn Robitussin syrup  - Incentive spirometry, chest physiotherapy  -??Regular diet per SLP recs  ??  Paroxysmal Afib  Patient entered into Afib with RVR on third day of withdrawal symptoms. Unable to take PO at the time, he was started on diltiazem drip until he could take his home diltiazem PO. Patient was consistently hypokalemic and hypomagnesemic; repleted daily. Transitioned back to home oral diltiazem from drip.  - diltiazem 360 mg daily  - Replete K >4, Mg >2   -??Reported loose stools decrease Mag  - Loperamide 4 mg   - Consider repeat C diff if continued??  ??    ??  Myotonic Clonic Seizure   Seizure at home most likely provoked seizure 2/2 alcohol or infection. Spot EEG during recent hospitalization not suggestive of underlying pathology. Neurology consult suggested that presentation was most consistent with provoked seizure and recommended titration of depakote to therapeutic level.   ??  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 Patient had psych admission where they attempted to switch to lithium, but patient did not tolerate. He has not been stable on antipsychotic monotherapy and has had reasonable stability with subtherapeutic dose of VPA. He continued on home salt tabs.   - 1 g salt tab TID  - CTM  ????  Lower Extremity Edema, resolved  New  onset edema without other HF symptoms. Differential includes new onset HF, renal dysfunction (primarily proteinuria), and portal hypertension. Bedside ECHO unremarkable. UPC unremarkable. RUQ [place] mostly unremarkable and without portal HTN, but liver dysfunction could be contributory factor to hypoalbuminemia. Improved with Lasix  -Continue Lasix   ??  Bipolar I Disorder: well-controlled on Depakote and Zyprexa. VPA subtherapeutic, but will not increase due to concern for SIADH and decreasing sodium level.   - continue zyprexa 20mg  at bedtime  ??  Tobacco use disorder: Patient currently smoking??2.5 PPD. SW provided education and option for outpatient follow up for tobacco cessation on 6/21.  - Nicotine replacement therapy with patch 42 mg daily, prn gum    Diarrhea (resolved)  Patient with several c diff infections in the past. Bouts of loose stools while inpatient at [facility]. 1BM recorded here. C diff negative, low concern for infectious cause.   -CTM  ??  Daily Checklist  - Diet:Regular   - DVT:SQ enoxaparin   - GI ZOX:WRUE  - Code: Full          SUBJECTIVE:     Interval History: NAEON. On RA. No desats w/ PT/OT. Initiating bowel reg for constipation.    ROS: Pertinent review of system completed and findings noted above    OBJECTIVE:     Vital signs (last 24 hours):   Temp:  [36.3 ??C-36.5 ??C] 36.5 ??C  Heart Rate:  [64-77] 77  Resp:  [16] 16  BP: (127-151)/(84) 151/84  MAP (mmHg):  [75-103] 103  SpO2:  [93 %-99 %] 93 %    Intake/Output:  I/O last 3 completed shifts:  In: -   Out: 860 [Urine:860]    Physical Exam:    GEN: NAD sitting in bed post shower   EYES: sclera anicteric, conjunctiva clear   HENT: NCAT, MMM, OP clear  NECK: trachea midline  RESP: CTAB, NWOB   CV: RRR, no m/r/g, ext no c/c/e, radial and DPP 2+ b/l  GI: abd soft, NTND, NABS   GU: Foley in place with clear-yellow urine  SKIN: no visible masses, lesions, rashes, ecchymoses, or lacerations  MSK: FROM in BUE and BLE, no notable contractures, no visible swelling or erythema over joints, joints NTTP  NEURO: A&Ox3, 5/5 throughout for exception of 4+ R EF/EE.          PSYCH: mood euthymic, affect appropriate, thought process logical                 Medications:    Scheduled   ??? dilTIAZem (CARDIZEM CD) 24 hr capsule 360 mg Daily   ??? divalproex ER (DEPAKOTE ER) extended release 24 hr tablet 750 mg Nightly   ??? docusate sodium (COLACE) capsule 100 mg Daily   ??? enoxaparin (LOVENOX) syringe 40 mg Q24H   ??? folic acid (FOLVITE) tablet 1 mg Daily   ??? gabapentin (NEURONTIN) capsule 300 mg BID   ??? magnesium oxide (MAG-OX) tablet 800 mg Daily   ??? melatonin tablet 3 mg QPM   ??? naltrexone (DEPADE) tablet 50 mg Daily   ??? nicotine (NICODERM CQ) 21 mg/24 hr patch 2 patch Daily   ??? OLANZapine (ZyPREXA) tablet 20 mg Nightly   ??? pantoprazole (PROTONIX) EC tablet 40 mg Daily   ??? polyethylene glycol (MIRALAX) packet 17 g Daily   ??? potassium chloride (KLOR-CON) packet 20 mEq Daily   ??? senna (SENOKOT) tablet 2 tablet Nightly   ??? sodium chloride tablet 1 g 3xd Meals   ??? thiamine mononitrate (vit B1)  tablet 100 mg Daily     PRN acetaminophen, 650 mg, Q6H PRN  calcium carbonate, 200 mg of elem calcium, TID PRN  fluticasone propionate, 1 spray, Daily PRN  guaiFENesin, 200 mg, Q4H PRN  guaiFENesin, 100 mg, Q4H PRN  ibuprofen, 600 mg, Q6H PRN  loperamide, 4 mg, Q4H PRN  nicotine polacrilex, 2 mg, Q2H PRN  ondansetron, 4 mg, Q6H PRN  oxyCODONE, 5 mg, Q4H PRN   Or  oxyCODONE, 10 mg, Q4H PRN  simethicone, 80 mg, TID PRN      Continuous Infusions      Labs: I have reviewed the labs and studies from the last 24hrs.    CBC -   Recent Labs   Lab Units 07/23/20  0644 07/22/20  0731 07/21/20  0714   WBC 10*9/L 8.0 9.4 7.8   RBC 10*12/L 3.49* 3.48* 3.54*   HEMOGLOBIN g/dL 16.1* 09.6* 04.5*   HEMATOCRIT % 36.4* 35.6* 36.0*   MCV fL 104.4* 102.2* 101.8*   MCH pg 36.5* 36.2* 36.1*   MCHC g/dL 40.9 81.1 91.4   RDW % 14.8 15.2 15.1   PLATELET COUNT (1) 10*9/L 403 406 377   MPV fL 8.1 8.0 8.2     BMP -   Recent Labs   Lab Units 07/23/20  0644 07/22/20  0731 07/21/20  0715   SODIUM mmol/L 134* 134* 133*   POTASSIUM mmol/L 4.3 4.2 4.5   CHLORIDE mmol/L 100 101 100   CO2 mmol/L 26.9 26.3 25.9   BUN mg/dL 9 9 9    CREATININE mg/dL 7.82 9.56 2.13   GLUCOSE mg/dL 90 86 97       Radiology Results: Reviewed Sunday Shams, MD  PGY-2 - Physical Medicine & Rehabilitation

## 2020-07-23 NOTE — Unmapped (Addendum)
Received pt lying comfortably in bed. Awake, alert and oriented x 4. No distress noted. Pt is cont/incont to bowel and bladder. Pt refused to wear condom cath at bed time. Pt requested to wear oxygen 0.5 L via [place] at bedtime. Chest Vest Therapy done at 2300. PIV on Left wrist, clean, dry intact. Due meds given. Miralax given for constipation as ordered. Still waiting for result.  Skin injury prevention, fall, aspiration, delirium and seizure precaution observed. Needs attended. Made pt comfortable. No complaint noted. Will cont to monitor.      Problem: Rehabilitation (IRF) Plan of Care  Goal: Plan of Care Review  Outcome: Progressing  Goal: Patient-Specific Goal (Individualized)  Outcome: Progressing  Goal: Absence of New-Onset Illness or Injury  Outcome: Progressing  Intervention: Prevent Fall and Fall Injury  Recent Flowsheet Documentation  Taken 07/23/2020 0000 by Hale Drone, RN  Safety Interventions: fall reduction program maintained  Taken 07/22/2020 2200 by Hale Drone, RN  Safety Interventions:  ??? fall reduction program maintained  ??? seizure precautions  Taken 07/22/2020 2000 by Hale Drone, RN  Safety Interventions: (Delirium Prec)  ??? fall reduction program maintained  ??? seizure precautions  ??? aspiration precautions  ??? other (comment)  Intervention: Prevent Infection  Recent Flowsheet Documentation  Taken 07/23/2020 0000 by Hale Drone, RN  Infection Prevention:  ??? single patient room provided  ??? rest/sleep promoted  ??? hand hygiene promoted  Taken 07/22/2020 2200 by Hale Drone, RN  Infection Prevention:  ??? single patient room provided  ??? rest/sleep promoted  ??? hand hygiene promoted  Taken 07/22/2020 2000 by Hale Drone, RN  Infection Prevention:  ??? single patient room provided  ??? rest/sleep promoted  ??? hand hygiene promoted  Intervention: Prevent VTE (Venous Thromboembolism)  Recent Flowsheet Documentation  Taken 07/22/2020 2100 by Hale Drone, RN  VTE Prevention/Management: anticoagulant therapy  Goal: Optimal Comfort and Wellbeing  Outcome: Progressing  Goal: Home and Community Transition Plan Established  Outcome: Progressing     Problem: Self-Care Deficit  Goal: Improved Ability to Complete Activities of Daily Living  Outcome: Progressing     Problem: Fall Injury Risk  Goal: Absence of Fall and Fall-Related Injury  Outcome: Progressing  Intervention: Promote Injury-Free Environment  Recent Flowsheet Documentation  Taken 07/23/2020 0000 by Hale Drone, RN  Safety Interventions: fall reduction program maintained  Taken 07/22/2020 2200 by Hale Drone, RN  Safety Interventions:  ??? fall reduction program maintained  ??? seizure precautions  Taken 07/22/2020 2000 by Hale Drone, RN  Safety Interventions: (Delirium Prec)  ??? fall reduction program maintained  ??? seizure precautions  ??? aspiration precautions  ??? other (comment)     Problem: Hypertension Comorbidity  Goal: Blood Pressure in Desired Range  Outcome: Progressing     Problem: Seizure Disorder Comorbidity  Goal: Maintenance of Seizure Control  Outcome: Progressing  Intervention: Maintain Seizure-Symptom Control  Recent Flowsheet Documentation  Taken 07/23/2020 0000 by Hale Drone, RN  Seizure Precautions:  ??? clutter-free environment maintained  ??? activity supervised  Taken 07/22/2020 2200 by Hale Drone, RN  Seizure Precautions:  ??? activity supervised  ??? clutter-free environment maintained  Taken 07/22/2020 2000 by Hale Drone, RN  Seizure Precautions:  ??? activity supervised  ??? clutter-free environment maintained

## 2020-07-24 LAB — BASIC METABOLIC PANEL
ANION GAP: 9 mmol/L (ref 5–14)
BLOOD UREA NITROGEN: 9 mg/dL (ref 9–23)
BUN / CREAT RATIO: 13
CALCIUM: 8.6 mg/dL — ABNORMAL LOW (ref 8.7–10.4)
CHLORIDE: 100 mmol/L (ref 98–107)
CO2: 26.5 mmol/L (ref 20.0–31.0)
CREATININE: 0.7 mg/dL
EGFR CKD-EPI (2021) MALE: >90 mL/min/{1.73_m2} (ref >=60)
GLUCOSE RANDOM: 88 mg/dL (ref 70–179)
POTASSIUM: 4.1 mmol/L (ref 3.4–4.8)
SODIUM: 135 mmol/L (ref 135–145)

## 2020-07-24 LAB — CBC W/ AUTO DIFF
BASOPHILS ABSOLUTE COUNT: 0.1 10*9/L (ref 0.0–0.1)
BASOPHILS RELATIVE PERCENT: 1 %
EOSINOPHILS ABSOLUTE COUNT: 0.2 10*9/L (ref 0.0–0.5)
EOSINOPHILS RELATIVE PERCENT: 2.7 %
HEMATOCRIT: 35.4 % — ABNORMAL LOW (ref 39.0–48.0)
HEMOGLOBIN: 12.6 g/dL — ABNORMAL LOW (ref 12.9–16.5)
LYMPHOCYTES ABSOLUTE COUNT: 1.6 10*9/L (ref 1.1–3.6)
LYMPHOCYTES RELATIVE PERCENT: 26.6 %
MEAN CORPUSCULAR HEMOGLOBIN CONC: 35.6 g/dL (ref 32.0–36.0)
MEAN CORPUSCULAR HEMOGLOBIN: 36.4 pg — ABNORMAL HIGH (ref 25.9–32.4)
MEAN CORPUSCULAR VOLUME: 102.1 fL — ABNORMAL HIGH (ref 77.6–95.7)
MEAN PLATELET VOLUME: 7.7 fL (ref 6.8–10.7)
MONOCYTES ABSOLUTE COUNT: 0.7 10*9/L (ref 0.3–0.8)
MONOCYTES RELATIVE PERCENT: 12.1 %
NEUTROPHILS ABSOLUTE COUNT: 3.6 10*9/L (ref 1.8–7.8)
NEUTROPHILS RELATIVE PERCENT: 57.6 %
NUCLEATED RED BLOOD CELLS: 0 /100{WBCs} (ref <=4)
PLATELET COUNT: 405 10*9/L (ref 150–450)
RED BLOOD CELL COUNT: 3.47 10*12/L — ABNORMAL LOW (ref 4.26–5.60)
RED CELL DISTRIBUTION WIDTH: 15.1 % (ref 12.2–15.2)
WBC ADJUSTED: 6.2 10*9/L (ref 3.6–11.2)

## 2020-07-24 LAB — MAGNESIUM: MAGNESIUM: 1.5 mg/dL — ABNORMAL LOW (ref 1.6–2.6)

## 2020-07-24 MED ADMIN — nicotine (NICODERM CQ) 21 mg/24 hr patch 2 patch: 2 | TRANSDERMAL | @ 12:00:00

## 2020-07-24 MED ADMIN — thiamine mononitrate (vit B1) tablet 100 mg: 100 mg | ORAL | @ 12:00:00

## 2020-07-24 MED ADMIN — sodium chloride tablet 1 g: 1 g | ORAL | @ 12:00:00

## 2020-07-24 MED ADMIN — docusate sodium (COLACE) capsule 100 mg: 100 mg | ORAL | @ 12:00:00

## 2020-07-24 MED ADMIN — magnesium oxide (MAG-OX) tablet 800 mg: 800 mg | ORAL | @ 12:00:00

## 2020-07-24 MED ADMIN — divalproex ER (DEPAKOTE ER) extended release 24 hr tablet 750 mg: 750 mg | ORAL | @ 01:00:00

## 2020-07-24 MED ADMIN — sodium chloride tablet 1 g: 1 g | ORAL | @ 15:00:00

## 2020-07-24 MED ADMIN — gabapentin (NEURONTIN) capsule 300 mg: 300 mg | ORAL | @ 12:00:00

## 2020-07-24 MED ADMIN — dilTIAZem (CARDIZEM CD) 24 hr capsule 360 mg: 360 mg | ORAL | @ 12:00:00

## 2020-07-24 MED ADMIN — guaiFENesin (ROBITUSSIN) oral syrup: 200 mg | ORAL | @ 12:00:00

## 2020-07-24 MED ADMIN — lidocaine (LIDODERM) 5 % patch 1 patch: 1 | TRANSDERMAL | @ 12:00:00

## 2020-07-24 MED ADMIN — naltrexone (DEPADE) tablet 50 mg: 50 mg | ORAL | @ 12:00:00

## 2020-07-24 MED ADMIN — OLANZapine (ZyPREXA) tablet 20 mg: 20 mg | ORAL | @ 01:00:00

## 2020-07-24 MED ADMIN — gabapentin (NEURONTIN) capsule 300 mg: 300 mg | ORAL | @ 01:00:00

## 2020-07-24 MED ADMIN — enoxaparin (LOVENOX) syringe 40 mg: 40 mg | SUBCUTANEOUS | @ 20:00:00

## 2020-07-24 MED ADMIN — folic acid (FOLVITE) tablet 1 mg: 1 mg | ORAL | @ 12:00:00

## 2020-07-24 MED ADMIN — potassium chloride (KLOR-CON) packet 20 mEq: 20 meq | ORAL | @ 12:00:00

## 2020-07-24 MED ADMIN — sodium chloride tablet 1 g: 1 g | ORAL | @ 22:00:00

## 2020-07-24 MED ADMIN — pantoprazole (PROTONIX) EC tablet 40 mg: 40 mg | ORAL | @ 12:00:00

## 2020-07-24 NOTE — Unmapped (Signed)
Received pt lying comfortably in bed. Awake, alert and oriented x 4. No distress noted. Pt is cont/incont to bowel and bladder. Pt refused to wear condom cath at bed time. Chest Vest Therapy done at 2200. PIV on Left wrist, clean, dry intact. Wife was informed to bring home CPAP.  Due meds given. Miralax given for constipation as ordered. Still waiting for result.  Skin injury prevention, fall, aspiration, delirium and seizure precaution observed. Needs attended. Made pt comfortable. No complaint noted. Will cont to monitor.     Problem: Rehabilitation (IRF) Plan of Care  Goal: Plan of Care Review  Outcome: Progressing  Goal: Patient-Specific Goal (Individualized)  Outcome: Progressing  Goal: Absence of New-Onset Illness or Injury  Outcome: Progressing  Intervention: Prevent Fall and Fall Injury  Recent Flowsheet Documentation  Taken 07/24/2020 0000 by Hale Drone, RN  Safety Interventions: fall reduction program maintained  Taken 07/23/2020 2200 by Hale Drone, RN  Safety Interventions: fall reduction program maintained  Taken 07/23/2020 2000 by Hale Drone, RN  Safety Interventions:   fall reduction program maintained   low bed  Intervention: Prevent Infection  Recent Flowsheet Documentation  Taken 07/24/2020 0000 by Hale Drone, RN  Infection Prevention:   single patient room provided   rest/sleep promoted   hand hygiene promoted  Taken 07/23/2020 2200 by Hale Drone, RN  Infection Prevention:   single patient room provided   rest/sleep promoted   hand hygiene promoted  Taken 07/23/2020 2000 by Hale Drone, RN  Infection Prevention:   single patient room provided   rest/sleep promoted   hand hygiene promoted  Intervention: Prevent VTE (Venous Thromboembolism)  Recent Flowsheet Documentation  Taken 07/23/2020 2100 by Hale Drone, RN  VTE Prevention/Management: anticoagulant therapy  Goal: Optimal Comfort and Wellbeing  Outcome: Progressing  Goal: Home and Community Transition Plan Established  Outcome: Progressing     Problem: Self-Care Deficit  Goal: Improved Ability to Complete Activities of Daily Living  Outcome: Progressing     Problem: Fall Injury Risk  Goal: Absence of Fall and Fall-Related Injury  Outcome: Progressing  Intervention: Promote Injury-Free Environment  Recent Flowsheet Documentation  Taken 07/24/2020 0000 by Hale Drone, RN  Safety Interventions: fall reduction program maintained  Taken 07/23/2020 2200 by Hale Drone, RN  Safety Interventions: fall reduction program maintained  Taken 07/23/2020 2000 by Hale Drone, RN  Safety Interventions:   fall reduction program maintained   low bed     Problem: Hypertension Comorbidity  Goal: Blood Pressure in Desired Range  Outcome: Progressing     Problem: Seizure Disorder Comorbidity  Goal: Maintenance of Seizure Control  Outcome: Progressing  Intervention: Maintain Seizure-Symptom Control  Recent Flowsheet Documentation  Taken 07/23/2020 2000 by Hale Drone, RN  Seizure Precautions: clutter-free environment maintained

## 2020-07-24 NOTE — Unmapped (Signed)
Care Management  Initial Transition Planning Assessment  Patient's discharge address and phone number verified as correct in demographics. Number home levels 1 and entrance steps 5. Pt lives with spouse and adult children.  Current HH is setup in the home: No. Current DME in home:  TTB Spouse is this patient's Insurance risk surveyor but she works 9-6pm.  Pt will be picked up at discharge by spouse.      CM met with patient in pt room.  Pt/visitors were not wearing hospital provided masks for the duration of the interaction with CM.   CM was wearing hospital provided surgical mask.  CM was within 6 foot of the patient/visitors during this interaction.      Type of Residence: Mailing Address:  [address]  [place] [address]  Contacts: Accompanied by: Alone  Password: Elvis  Patient Phone Number: [phone]        Medical Provider(s): Fabio Pierce, MD  Reason for Admission: Admitting Diagnosis:  debility  Past Medical History:   has a past medical history of Alcohol withdrawal syndrome, with delirium (CMS-HCC) (05/11/2012), Anxiety, Bipolar 1 disorder (CMS-HCC), Depressive disorder, Erectile dysfunction, Esophageal reflux, History of pyloric stenosis, Hypertension, Insomnia, Psoriasis, Seizure (CMS-HCC) (02/20/2019), and Tobacco use.  Past Surgical History:   has a past surgical history that includes Pyloromyotomy and Wisdom tooth extraction.   Previous admit date: 07/08/2020    Primary Insurance- Payor: MEDICAID [place] / Plan: MEDICAID  ACCESS / Product Type: *No Product type* /   Secondary Insurance - None  Prescription Coverage - yes  Preferred Pharmacy - [facility] [phone] - [place], [place] - [address] AT [facility] [address]  [facility] W[facility]  [facility] [phone] - [place], [place] - [address]    Transportation home: Acupuncturist assessed the patient by : In person interview with patient, Medical record review, Discussion with Clinical Care team  Orientation Level: Oriented X4  Functional level prior to admission: Partially Assisted  Who provides care at home?: Family member  Reason for referral: Discharge Planning    Contact/Decision [facility]  Extended Emergency Contact Information  Primary Emergency Contact: Bisbee,[name]  Address: [address]           [place], [place] [address] [place] [employer] Phone: [phone]  Relation: Spouse  Preferred language: ENGLISH  Interpreter needed? No  Secondary Emergency Contact: Wallene [place]  Mobile Phone: [phone]  Relation: Daughter  Interpreter needed? No    Legal Next of Kin / Guardian / POA / Advance Directives     HCDM (patient stated preference): Bisbee,[name] - Spouse - [phone]    Advance Directive (Medical Treatment)  Does patient have an advance directive covering medical treatment?: Patient has advance directive covering medical treatment, copy not in chart.  Advance directive covering medical treatment not in Chart:: Copy requested from family  Reason patient does not have an advance directive covering medical treatment:: Patient does not wish to complete one at this time.    Health Care Decision Maker [HCDM] (Medical & Mental Health Treatment)  Healthcare Decision Maker: Patient does not wish to appoint a Health Care Decision Maker at this time  Information offered on HCDM, Medical & Mental Health advance directives:: Patient declined information.  Advance Directive (Mental Health Treatment)  Does patient have an advance directive covering mental health treatment?: Patient does not have advance directive covering mental health treatment.  Reason patient does not have an advance directive covering mental health treatment:: Patient needs follow-up to complete one.    Patient Information  Lives with: Spouse/significant other    Type of Residence: Private residence Location/Detail: [address] [place], [place] [address]/6 level home with 5 entrance steps and ramp in place    Support Systems/Concerns: Spouse, Children    Responsibilities/Dependents at home?: No    Home Care services in place prior to admission?: No  Type of Home Care services in place prior to admission: Home health aide               Equipment Currently Used at Home: tub bench       Currently receiving outpatient dialysis?: No       Financial Information       Need for financial assistance?: No       Social Determinants of Health  Social Determinants of Health were addressed in provider documentation.  Please refer to patient history.  Social Determinants of Health     Tobacco Use: High Risk   ??? Smoking Tobacco Use: Current Every Day Smoker   ??? Smokeless Tobacco Use: Never Used   Alcohol Use: Heavy Drinker   ??? How often do you have a drink containing alcohol?: 4+ times per week   ??? How many drinks containing alcohol do you have on a typical day when you are drinking?: 5 - 6   ??? How often do you have 5 or more drinks on one occasion?: Daily or almost daily   Financial Resource Strain: Medium Risk   ??? Difficulty of Paying Living Expenses: Somewhat hard   Food Insecurity: No Food Insecurity   ??? Worried About Running Out of Food in the Last Year: Never true   ??? Ran Out of Food in the Last Year: Never true   Transportation Needs: Unmet Transportation Needs   ??? Lack of Transportation (Medical): Yes   ??? Lack of Transportation (Non-Medical): Yes   Physical Activity: Not on file   Stress: Not on file   Social Connections: Not on file   Intimate Partner Violence: Not on file   Depression: At risk   ??? PHQ-2 Score: 5   Housing/Utilities: Low Risk    ??? Within the past 12 months, have you ever stayed: outside, in a car, in a tent, in an overnight shelter, or temporarily in someone else's home (i.e. couch-surfing)?: No   ??? Are you worried about losing your housing?: No   ??? Within the past 12 months, have you been unable to get utilities (heat, electricity) when it was really needed?: No   Substance Use: Not on file   Health Literacy: Not on file       Discharge Needs Assessment  Concerns to be Addressed: coping/stress, discharge planning    Clinical Risk Factors: New Diagnosis, Principal Diagnosis: Cancer, Stroke, COPD, Heart Failure, AMI, Pneumonia, Joint Replacment, Multiple Diagnoses (Chronic), Functional Limitations    Barriers to taking medications: No    Prior overnight hospital stay or ED visit in last 90 days: Yes    Readmission Within the Last 30 Days: planned readmission         Anticipated Changes Related to Illness: inability to care for self    Equipment Needed After Discharge: other (see comments) (TBD)    Discharge  Facility/Level of Care Needs:      Readmission  Risk of Unplanned Readmission Score: UNPLANNED READMISSION SCORE: 38.44%  Predictive Model Details          38% (High)  Factor Value    Calculated 07/24/2020 12:03 18% Number of hospitalizations in last year 7    [facility] Risk of Unplanned Readmission Model 15% Number of active Rx orders 43     14% Number of ED visits in last six months 5     10% Active NSAID Rx order present     5% Active antipsychotic Rx order present     5% ECG/EKG order present in last 6 months     5% Latest calcium low (8.6 mg/dL)     4% Encounter of ten days or longer in last year present     4% Diagnosis of electrolyte disorder present     4% Restraint order present in last 6 months     3% Imaging order present in last 6 months     3% Latest hemoglobin low (12.6 g/dL)     2% Diagnosis of deficiency anemia present     2% Age 59     2% Active anticoagulant Rx order present     2% Current length of stay 3.867 days     1% Future appointment scheduled     1% Active ulcer medication Rx order present      Readmitted Within the Last 30 Days? (No if blank) Yes  Patient at risk for readmission?: Yes    Discharge Plan  Screen findings are: Discharge planning needs identified or anticipated (Comment).    Expected Discharge Date: 08/01/2020    Expected Transfer from Critical Care:      Quality data for continuing care services shared with patient and/or representative?: Yes  Patient and/or family were provided with choice of facilities / services that are available and appropriate to meet post hospital care needs?: Yes       Initial Assessment complete?: Yes

## 2020-07-24 NOTE — Unmapped (Signed)
Physical Medicine and Rehabilitation  PMR Daily Progress Note [facility]    ASSESSMENT:   Nicholas Rush is a 55 y.o. male with PMH of HTN, bipolar I??disorder, alcohol use??disorder c/b withdrawal seizures,??recurrent??C??diff??infection, who presented s/p witnessed grand mal seizure for monitored alcohol withdrawal.  ??  Rehab Impairment Group Code [facility]): (Neurologic Conditions) 03.9 Other Neurologic  Etiology:Neurological Change secondary to acute alcohol withdrawal complicated by seizure  PLAN:     This patient is admitted to the Physical Medicine and Rehabilitation - Inpatient - B service, please page [phone] from 8am-5pm on weekdays for questions regarding this patient. After hours and on weekends please contact the 1st call resident pager   ??  REHAB:   - PT and OT to maximize functional status with mobility and ADLs as well as prevention of joint contracture.   - SLP for cognitive and swallow function.  - Neuropsych for higher level cognitive evaluation and coping.  - RT for community re-integration, education, and leisure support services.  - Nutrition consult for diet information/teaching.   - Discussed in weekly Interdisciplinary Team Conference set for 7/14  ??  Alcohol Use Disorder??-??hx complicated W/d  Patient with EtOH at 295 on admission. About 3 days into admission developed withdrawal symptoms including autonomic instability, tremor, hallucination, confusion, and agitation. He was given scheduled Ativan and had serial CIWA scoring with prn Ativan. During admission he was also treated with folate, IV thiamine, and home gabapentin. Discussed resources for sobriety and naltrexone.  - PO thiamine and folate daily  - zofran PRN  - melatonin at bedtime,??delirium precautions  -??Maintain gabapentin 300 mg??BID (6/30)  - Naltrexone 50 mg daily  ??  Concern for Aspiration Pneumonia - Cough  On second day of withdrawal patient began to spike fevers. He was noted to have trouble with airway secretions and was producing purulent sputum. Started on Unasyn for empiric coverage of potential aspiration pneumonia. Fever seemed to track more with withdrawal process and patient appeared less infectious. He was transitioned to Augmentin to complete 5 days of antibiotics.   - Completed Abx course  -CXR 6/29 w/ improved consolidation  - Prn Robitussin syrup  - Incentive spirometry, chest physiotherapy  -??Regular diet per SLP recs  ??  Paroxysmal Afib  Patient entered into Afib with RVR on third day of withdrawal symptoms. Unable to take PO at the time, he was started on diltiazem drip until he could take his home diltiazem PO. Patient was consistently hypokalemic and hypomagnesemic; repleted daily. Transitioned back to home oral diltiazem from drip.  - diltiazem 360 mg daily  - Replete K >4, Mg >2   -??Reported loose stools decrease Mag  - Loperamide 4 mg   - Consider repeat C diff if continued??  ??    ??  Myotonic Clonic Seizure   Seizure at home most likely provoked seizure 2/2 alcohol or infection. Spot EEG during recent hospitalization not suggestive of underlying pathology. Neurology consult suggested that presentation was most consistent with provoked seizure and recommended titration of depakote to therapeutic level.   ??  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 Patient had psych admission where they attempted to switch to lithium, but patient did not tolerate. He has not been stable on antipsychotic monotherapy and has had reasonable stability with subtherapeutic dose of VPA. He continued on home salt tabs.   - 1 g salt tab TID  - CTM  ????  Lower Extremity Edema, resolved  New  onset edema without other HF symptoms. Differential includes new onset HF, renal dysfunction (primarily proteinuria), and portal hypertension. Bedside ECHO unremarkable. UPC unremarkable. RUQ [place] mostly unremarkable and without portal HTN, but liver dysfunction could be contributory factor to hypoalbuminemia. Improved with Lasix  -Continue Lasix   ??  Bipolar I Disorder: well-controlled on Depakote and Zyprexa. VPA subtherapeutic, but will not increase due to concern for SIADH and decreasing sodium level.   - continue zyprexa 20mg  at bedtime  ??  Tobacco use disorder: Patient currently smoking??2.5 PPD. SW provided education and option for outpatient follow up for tobacco cessation on 6/21.  - Nicotine replacement therapy with patch 42 mg daily, prn gum    Diarrhea (resolved)  Patient with several c diff infections in the past. Bouts of loose stools while inpatient at [facility]. 1BM recorded here. C diff negative, low concern for infectious cause.   -CTM  ??  Daily Checklist  - Diet:Regular   - DVT:SQ enoxaparin   - GI FAO:ZHYQ  - Code: Full          SUBJECTIVE:     Interval History: NAEON. On RA. No desats w/ PT/OT. BM 7/5. Pending NSPY.     ROS: Pertinent review of system completed and findings noted above    OBJECTIVE:     Vital signs (last 24 hours):   Temp:  [36.8 ??C] 36.8 ??C  Heart Rate:  [79-83] 79  Resp:  [14-16] 16  BP: (124-154)/(77-83) 154/83  MAP (mmHg):  [93-102] 102  SpO2:  [93 %] 93 %    Intake/Output:  I/O last 3 completed shifts:  In: -   Out: 450 [Urine:450]    Physical Exam:    GEN: NAD working w/ PT  EYES: sclera anicteric, conjunctiva clear   HENT: NCAT, MMM, OP clear  NECK: trachea midline  RESP: CTAB, NWOB   CV: RRR, no m/r/g, ext no c/c/e, radial and DPP 2+ b/l  GI: abd soft, NTND, NABS   GU: Foley in place with clear-yellow urine  SKIN: no visible masses, lesions, rashes, ecchymoses, or lacerations  MSK: FROM in BUE and BLE, no notable contractures, no visible swelling or erythema over joints, joints NTTP  NEURO: A&Ox3, 5/5 throughout for exception of 4+ R EF/EE.          PSYCH: mood euthymic, affect appropriate, thought process logical                 Medications:    Scheduled   ??? dilTIAZem (CARDIZEM CD) 24 hr capsule 360 mg Daily   ??? divalproex ER (DEPAKOTE ER) extended release 24 hr tablet 750 mg Nightly   ??? docusate sodium (COLACE) capsule 100 mg Daily   ??? enoxaparin (LOVENOX) syringe 40 mg Q24H   ??? folic acid (FOLVITE) tablet 1 mg Daily   ??? gabapentin (NEURONTIN) capsule 300 mg BID   ??? lidocaine (LIDODERM) 5 % patch 1 patch Daily   ??? magnesium oxide (MAG-OX) tablet 800 mg Daily   ??? melatonin tablet 3 mg QPM   ??? naltrexone (DEPADE) tablet 50 mg Daily   ??? nicotine (NICODERM CQ) 21 mg/24 hr patch 2 patch Daily   ??? OLANZapine (ZyPREXA) tablet 20 mg Nightly   ??? pantoprazole (PROTONIX) EC tablet 40 mg Daily   ??? potassium chloride (KLOR-CON) packet 20 mEq Daily   ??? senna (SENOKOT) tablet 2 tablet Nightly   ??? sodium chloride tablet 1 g 3xd Meals   ??? thiamine mononitrate (vit B1) tablet 100 mg Daily  PRN acetaminophen, 650 mg, Q6H PRN  calcium carbonate, 200 mg of elem calcium, TID PRN  fluticasone propionate, 1 spray, Daily PRN  guaiFENesin, 200 mg, Q4H PRN  guaiFENesin, 100 mg, Q4H PRN  ibuprofen, 600 mg, Q6H PRN  loperamide, 4 mg, Q4H PRN  nicotine polacrilex, 2 mg, Q2H PRN  ondansetron, 4 mg, Q6H PRN  oxyCODONE, 5 mg, Q4H PRN   Or  oxyCODONE, 10 mg, Q4H PRN  simethicone, 80 mg, TID PRN      Continuous Infusions      Labs: I have reviewed the labs and studies from the last 24hrs.    CBC -   Recent Labs   Lab Units 07/24/20  0623 07/23/20  0644 07/22/20  0731   WBC 10*9/L 6.2 8.0 9.4   RBC 10*12/L 3.47* 3.49* 3.48*   HEMOGLOBIN g/dL 16.1* 09.6* 04.5*   HEMATOCRIT % 35.4* 36.4* 35.6*   MCV fL 102.1* 104.4* 102.2*   MCH pg 36.4* 36.5* 36.2*   MCHC g/dL 40.9 81.1 91.4   RDW % 15.1 14.8 15.2   PLATELET COUNT (1) 10*9/L 405 403 406   MPV fL 7.7 8.1 8.0     BMP -   Recent Labs   Lab Units 07/24/20  0623 07/23/20  0644 07/22/20  0731   SODIUM mmol/L 135 134* 134*   POTASSIUM mmol/L 4.1 4.3 4.2   CHLORIDE mmol/L 100 100 101   CO2 mmol/L 26.5 26.9 26.3   BUN mg/dL 9 9 9    CREATININE mg/dL 7.82 9.56 2.13   GLUCOSE mg/dL 88 90 86       Radiology Results: Reviewed         Sunday Shams, MD  PGY-2 - Physical Medicine & Rehabilitation
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
## 2020-07-24 NOTE — Unmapped (Signed)
Received awake, alert and oriented x 4. No distress noted. Patient is cont/incont to bowel and bladder.  Chest Vest Therapy done at 1000and 1300, well tolerated.  PIV on Left wrist, clean, dry intact patent. Due meds given. LBM 07/23/2020.  Skin injury prevention, fall, aspiration, delirium and seizure precautions maintained. Needs attended. Nursing will cont to monitor and assess.       Problem: Rehabilitation (IRF) Plan of Care  Goal: Plan of Care Review  Outcome: Progressing  Goal: Patient-Specific Goal (Individualized)  Outcome: Progressing  Goal: Absence of New-Onset Illness or Injury  Outcome: Progressing  Intervention: Prevent Fall and Fall Injury  Recent Flowsheet Documentation  Taken 07/23/2020 1800 by Starla Link, RN  Safety Interventions:   fall reduction program maintained   low bed  Taken 07/23/2020 1600 by Starla Link, RN  Safety Interventions:   fall reduction program maintained   low bed  Taken 07/23/2020 1400 by Starla Link, RN  Safety Interventions:   fall reduction program maintained   low bed  Taken 07/23/2020 1200 by Starla Link, RN  Safety Interventions:   fall reduction program maintained   aspiration precautions   low bed  Taken 07/23/2020 0800 by Starla Link, RN  Safety Interventions:   fall reduction program maintained   low bed  Intervention: Prevent Infection  Recent Flowsheet Documentation  Taken 07/23/2020 1600 by Starla Link, RN  Infection Prevention: hand hygiene promoted  Taken 07/23/2020 1200 by Starla Link, RN  Infection Prevention: hand hygiene promoted  Taken 07/23/2020 0800 by Starla Link, RN  Infection Prevention:   hand hygiene promoted   rest/sleep promoted  Intervention: Prevent VTE (Venous Thromboembolism)  Recent Flowsheet Documentation  Taken 07/23/2020 1400 by Starla Link, RN  VTE Prevention/Management: (lovenox) anticoagulant therapy  Goal: Optimal Comfort and Wellbeing  Outcome: Progressing  Goal: Home and Community Transition Plan Established  Outcome: Progressing     Problem: Self-Care Deficit  Goal: Improved Ability to Complete Activities of Daily Living  Outcome: Progressing  Intervention: Promote Activity and Functional Independence  Recent Flowsheet Documentation  Taken 07/23/2020 1400 by Starla Link, RN  Self-Care Promotion: independence encouraged     Problem: Fall Injury Risk  Goal: Absence of Fall and Fall-Related Injury  Outcome: Progressing  Intervention: Identify and Manage Contributors  Recent Flowsheet Documentation  Taken 07/23/2020 1400 by Starla Link, RN  Self-Care Promotion: independence encouraged  Intervention: Promote Injury-Free Environment  Recent Flowsheet Documentation  Taken 07/23/2020 1800 by Starla Link, RN  Safety Interventions:   fall reduction program maintained   low bed  Taken 07/23/2020 1600 by Starla Link, RN  Safety Interventions:   fall reduction program maintained   low bed  Taken 07/23/2020 1400 by Starla Link, RN  Safety Interventions:   fall reduction program maintained   low bed  Taken 07/23/2020 1200 by Starla Link, RN  Safety Interventions:   fall reduction program maintained   aspiration precautions   low bed  Taken 07/23/2020 0800 by Starla Link, RN  Safety Interventions:   fall reduction program maintained   low bed     Problem: Hypertension Comorbidity  Goal: Blood Pressure in Desired Range  Outcome: Progressing     Problem: Seizure Disorder Comorbidity  Goal: Maintenance of Seizure Control  Outcome: Progressing  Intervention: Maintain Seizure-Symptom Control  Recent Flowsheet Documentation  Taken 07/23/2020 1600 by Starla Link, RN  Seizure Precautions: clutter-free environment maintained  Taken 07/23/2020 0800 by Starla Link, RN  Seizure Precautions: clutter-free environment maintained

## 2020-07-25 LAB — CBC W/ AUTO DIFF
BASOPHILS ABSOLUTE COUNT: 0.1 10*9/L (ref 0.0–0.1)
BASOPHILS RELATIVE PERCENT: 0.9 %
EOSINOPHILS ABSOLUTE COUNT: 0.2 10*9/L (ref 0.0–0.5)
EOSINOPHILS RELATIVE PERCENT: 2.4 %
HEMATOCRIT: 35 % — ABNORMAL LOW (ref 39.0–48.0)
HEMOGLOBIN: 12.5 g/dL — ABNORMAL LOW (ref 12.9–16.5)
LYMPHOCYTES ABSOLUTE COUNT: 1.8 10*9/L (ref 1.1–3.6)
LYMPHOCYTES RELATIVE PERCENT: 24.6 %
MEAN CORPUSCULAR HEMOGLOBIN CONC: 35.8 g/dL (ref 32.0–36.0)
MEAN CORPUSCULAR HEMOGLOBIN: 37.1 pg — ABNORMAL HIGH (ref 25.9–32.4)
MEAN CORPUSCULAR VOLUME: 103.6 fL — ABNORMAL HIGH (ref 77.6–95.7)
MEAN PLATELET VOLUME: 8.1 fL (ref 6.8–10.7)
MONOCYTES ABSOLUTE COUNT: 0.9 10*9/L — ABNORMAL HIGH (ref 0.3–0.8)
MONOCYTES RELATIVE PERCENT: 12.3 %
NEUTROPHILS ABSOLUTE COUNT: 4.4 10*9/L (ref 1.8–7.8)
NEUTROPHILS RELATIVE PERCENT: 59.8 %
NUCLEATED RED BLOOD CELLS: 0 /100{WBCs} (ref <=4)
PLATELET COUNT: 435 10*9/L (ref 150–450)
RED BLOOD CELL COUNT: 3.38 10*12/L — ABNORMAL LOW (ref 4.26–5.60)
RED CELL DISTRIBUTION WIDTH: 14.6 % (ref 12.2–15.2)
WBC ADJUSTED: 7.4 10*9/L (ref 3.6–11.2)

## 2020-07-25 LAB — BASIC METABOLIC PANEL
ANION GAP: 7 mmol/L (ref 5–14)
BLOOD UREA NITROGEN: 8 mg/dL — ABNORMAL LOW (ref 9–23)
BUN / CREAT RATIO: 13
CALCIUM: 8.4 mg/dL — ABNORMAL LOW (ref 8.7–10.4)
CHLORIDE: 100 mmol/L (ref 98–107)
CO2: 27.3 mmol/L (ref 20.0–31.0)
CREATININE: 0.64 mg/dL
EGFR CKD-EPI (2021) MALE: >90 mL/min/{1.73_m2} (ref >=60)
GLUCOSE RANDOM: 91 mg/dL (ref 70–179)
POTASSIUM: 4 mmol/L (ref 3.4–4.8)
SODIUM: 134 mmol/L — ABNORMAL LOW (ref 135–145)

## 2020-07-25 LAB — MAGNESIUM: MAGNESIUM: 1.5 mg/dL — ABNORMAL LOW (ref 1.6–2.6)

## 2020-07-25 MED ADMIN — acetaminophen (TYLENOL) tablet 650 mg: 650 mg | ORAL | @ 13:00:00

## 2020-07-25 MED ADMIN — sodium chloride tablet 1 g: 1 g | ORAL | @ 21:00:00

## 2020-07-25 MED ADMIN — thiamine mononitrate (vit B1) tablet 100 mg: 100 mg | ORAL | @ 13:00:00

## 2020-07-25 MED ADMIN — sodium chloride tablet 1 g: 1 g | ORAL | @ 16:00:00

## 2020-07-25 MED ADMIN — gabapentin (NEURONTIN) capsule 300 mg: 300 mg | ORAL

## 2020-07-25 MED ADMIN — gabapentin (NEURONTIN) capsule 300 mg: 300 mg | ORAL | @ 13:00:00

## 2020-07-25 MED ADMIN — lidocaine (LIDODERM) 5 % patch 1 patch: 1 | TRANSDERMAL | @ 13:00:00

## 2020-07-25 MED ADMIN — naltrexone (DEPADE) tablet 50 mg: 50 mg | ORAL | @ 13:00:00

## 2020-07-25 MED ADMIN — potassium chloride (KLOR-CON) packet 20 mEq: 20 meq | ORAL | @ 13:00:00

## 2020-07-25 MED ADMIN — sodium chloride tablet 1 g: 1 g | ORAL | @ 13:00:00

## 2020-07-25 MED ADMIN — divalproex ER (DEPAKOTE ER) extended release 24 hr tablet 750 mg: 750 mg | ORAL

## 2020-07-25 MED ADMIN — docusate sodium (COLACE) capsule 100 mg: 100 mg | ORAL | @ 13:00:00

## 2020-07-25 MED ADMIN — guaiFENesin (ROBITUSSIN) oral syrup: 200 mg | ORAL | @ 14:00:00

## 2020-07-25 MED ADMIN — magnesium oxide (MAG-OX) tablet 800 mg: 800 mg | ORAL | @ 13:00:00

## 2020-07-25 MED ADMIN — enoxaparin (LOVENOX) syringe 40 mg: 40 mg | SUBCUTANEOUS | @ 21:00:00

## 2020-07-25 MED ADMIN — OLANZapine (ZyPREXA) tablet 20 mg: 20 mg | ORAL

## 2020-07-25 MED ADMIN — melatonin tablet 3 mg: 3 mg | ORAL

## 2020-07-25 MED ADMIN — dilTIAZem (CARDIZEM CD) 24 hr capsule 360 mg: 360 mg | ORAL | @ 13:00:00

## 2020-07-25 MED ADMIN — pantoprazole (PROTONIX) EC tablet 40 mg: 40 mg | ORAL | @ 13:00:00

## 2020-07-25 MED ADMIN — nicotine (NICODERM CQ) 21 mg/24 hr patch 2 patch: 2 | TRANSDERMAL | @ 13:00:00

## 2020-07-25 MED ADMIN — folic acid (FOLVITE) tablet 1 mg: 1 mg | ORAL | @ 13:00:00

## 2020-07-25 MED ADMIN — ibuprofen (MOTRIN) tablet 600 mg: 600 mg | ORAL | @ 13:00:00

## 2020-07-25 NOTE — Unmapped (Signed)
Alert and oriented x3. Denies pain and discomfort. On oxygen at 2l/min via [place]. Chest vest therapy done. Nicotine patches on both arms. Voiding in the urinal. LBM 7/5. Reminded to call for assistance and needs. Bed alarm for safety. Will continue to monitor.     Problem: Rehabilitation (IRF) Plan of Care  Goal: Plan of Care Review  Outcome: Progressing  Goal: Patient-Specific Goal (Individualized)  Outcome: Progressing  Goal: Absence of New-Onset Illness or Injury  Outcome: Progressing  Intervention: Prevent Fall and Fall Injury  Recent Flowsheet Documentation  Taken 07/25/2020 0000 by Burnett Sheng Corliss Marcus, RN  Safety Interventions: fall reduction program maintained  Taken 07/24/2020 2200 by Burnett Sheng Corliss Marcus, RN  Safety Interventions: fall reduction program maintained  Taken 07/24/2020 2000 by Theador Hawthorne, RN  Safety Interventions: fall reduction program maintained  Intervention: Prevent VTE (Venous Thromboembolism)  Recent Flowsheet Documentation  Taken 07/24/2020 2100 by Theador Hawthorne, RN  VTE Prevention/Management: anticoagulant therapy  Goal: Optimal Comfort and Wellbeing  Outcome: Progressing  Goal: Home and Community Transition Plan Established  Outcome: Progressing     Problem: Fall Injury Risk  Goal: Absence of Fall and Fall-Related Injury  Outcome: Progressing  Intervention: Promote Injury-Free Environment  Recent Flowsheet Documentation  Taken 07/25/2020 0000 by Burnett Sheng Corliss Marcus, RN  Safety Interventions: fall reduction program maintained  Taken 07/24/2020 2200 by Burnett Sheng Corliss Marcus, RN  Safety Interventions: fall reduction program maintained  Taken 07/24/2020 2000 by Theador Hawthorne, RN  Safety Interventions: fall reduction program maintained     Problem: Seizure Disorder Comorbidity  Goal: Maintenance of Seizure Control  Outcome: Progressing

## 2020-07-25 NOTE — Unmapped (Signed)
Physical Medicine and Rehabilitation  PMR Daily Progress Note [facility]    ASSESSMENT:   Nicholas Rush is a 55 y.o. male with PMH of HTN, bipolar I??disorder, alcohol use??disorder c/b withdrawal seizures,??recurrent??C??diff??infection, who presented s/p witnessed grand mal seizure for monitored alcohol withdrawal.  ??  Rehab Impairment Group Code [facility]): (Neurologic Conditions) 03.9 Other Neurologic  Etiology:Neurological Change secondary to acute alcohol withdrawal complicated by seizure  PLAN:     This patient is admitted to the Physical Medicine and Rehabilitation - Inpatient - B service, please page [phone] from 8am-5pm on weekdays for questions regarding this patient. After hours and on weekends please contact the 1st call resident pager   ??  REHAB:   - PT and OT to maximize functional status with mobility and ADLs as well as prevention of joint contracture.   - SLP for cognitive and swallow function.  - Neuropsych for higher level cognitive evaluation and coping.  - RT for community re-integration, education, and leisure support services.  - Nutrition consult for diet information/teaching.   - Discussed in weekly Interdisciplinary Team Conference set for 7/14  ??  Alcohol Use Disorder??-??hx complicated W/d  Patient with EtOH at 295 on admission. About 3 days into admission developed withdrawal symptoms including autonomic instability, tremor, hallucination, confusion, and agitation. He was given scheduled Ativan and had serial CIWA scoring with prn Ativan. During admission he was also treated with folate, IV thiamine, and home gabapentin. Discussed resources for sobriety and naltrexone.  - PO thiamine and folate daily  - zofran PRN  - melatonin at bedtime,??delirium precautions  -??Maintain gabapentin 300 mg??BID (6/30)  - Naltrexone 50 mg daily  ??  Concern for Aspiration Pneumonia - Cough  On second day of withdrawal patient began to spike fevers. He was noted to have trouble with airway secretions and was producing purulent sputum. Started on Unasyn for empiric coverage of potential aspiration pneumonia. Fever seemed to track more with withdrawal process and patient appeared less infectious. He was transitioned to Augmentin to complete 5 days of antibiotics.   - Completed Abx course  -CXR 6/29 w/ improved consolidation  - Prn Robitussin syrup  - Incentive spirometry, chest physiotherapy  -??Regular diet per SLP recs  ??  Paroxysmal Afib  Patient entered into Afib with RVR on third day of withdrawal symptoms. Unable to take PO at the time, he was started on diltiazem drip until he could take his home diltiazem PO. Patient was consistently hypokalemic and hypomagnesemic; repleted daily. Transitioned back to home oral diltiazem from drip.  - diltiazem 360 mg daily  - Replete K >4, Mg >2   -??Reported loose stools decrease Mag  - Loperamide 4 mg   - Consider repeat C diff if continued??  ??    ??  Myotonic Clonic Seizure   Seizure at home most likely provoked seizure 2/2 alcohol or infection. Spot EEG during recent hospitalization not suggestive of underlying pathology. Neurology consult suggested that presentation was most consistent with provoked seizure and recommended titration of depakote to therapeutic level.   ??  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 Patient had psych admission where they attempted to switch to lithium, but patient did not tolerate. He has not been stable on antipsychotic monotherapy and has had reasonable stability with subtherapeutic dose of VPA. He continued on home salt tabs.   - 1 g salt tab TID  - CTM  ????  Lower Extremity Edema, resolved  New  onset edema without other HF symptoms. Differential includes new onset HF, renal dysfunction (primarily proteinuria), and portal hypertension. Bedside ECHO unremarkable. UPC unremarkable. RUQ [place] mostly unremarkable and without portal HTN, but liver dysfunction could be contributory factor to hypoalbuminemia. Improved with Lasix  -Continue Lasix   ??  Bipolar I Disorder: well-controlled on Depakote and Zyprexa. VPA subtherapeutic, but will not increase due to concern for SIADH and decreasing sodium level.   - continue zyprexa 20mg  at bedtime  ??  Tobacco use disorder: Patient currently smoking??2.5 PPD. SW provided education and option for outpatient follow up for tobacco cessation on 6/21.  - Nicotine replacement therapy with patch 42 mg daily, prn gum    Diarrhea (resolved)  Patient with several c diff infections in the past. Bouts of loose stools while inpatient at [facility]. 1BM recorded here. C diff negative, low concern for infectious cause.   -CTM  ??  Daily Checklist  - Diet:Regular   - DVT:SQ enoxaparin   - GI ZOX:WRUE  - Code: Full          SUBJECTIVE:     Interval History: NAEON. On RA. Pending NSPY. Wife to bring CPAP.    ROS: Pertinent review of system completed and findings noted above    OBJECTIVE:     Vital signs (last 24 hours):   Temp:  [36 ??C-37.3 ??C] 36 ??C  Heart Rate:  [65-82] 82  Resp:  [18] 18  BP: (119-152)/(75-86) 152/86  MAP (mmHg):  [92-103] 103  SpO2:  [94 %-98 %] 98 %    Intake/Output:  I/O last 3 completed shifts:  In: -   Out: 570 [Urine:570]    Physical Exam:    GEN: NAD   EYES: sclera anicteric, conjunctiva clear   HENT: NCAT, MMM, OP clear  NECK: trachea midline  RESP: CTAB, NWOB   CV: RRR, no m/r/g, ext no c/c/e, radial and DPP 2+ b/l  GI: abd soft, NTND, NABS   GU: Foley in place with clear-yellow urine  SKIN: no visible masses, lesions, rashes, ecchymoses, or lacerations  MSK: FROM in BUE and BLE, no notable contractures, no visible swelling or erythema over joints, joints NTTP  NEURO: A&Ox3, 5/5 throughout for exception of 4+ R EF/EE.          PSYCH: mood euthymic, affect appropriate, thought process logical                 Medications:    Scheduled   ??? dilTIAZem (CARDIZEM CD) 24 hr capsule 360 mg Daily   ??? divalproex ER (DEPAKOTE ER) extended release 24 hr tablet 750 mg Nightly   ??? docusate sodium (COLACE) capsule 100 mg Daily   ??? enoxaparin (LOVENOX) syringe 40 mg Q24H   ??? folic acid (FOLVITE) tablet 1 mg Daily   ??? gabapentin (NEURONTIN) capsule 300 mg BID   ??? lidocaine (LIDODERM) 5 % patch 1 patch Daily   ??? magnesium oxide (MAG-OX) tablet 800 mg BID   ??? melatonin tablet 3 mg QPM   ??? naltrexone (DEPADE) tablet 50 mg Daily   ??? nicotine (NICODERM CQ) 21 mg/24 hr patch 2 patch Daily   ??? OLANZapine (ZyPREXA) tablet 20 mg Nightly   ??? pantoprazole (PROTONIX) EC tablet 40 mg Daily   ??? potassium chloride (KLOR-CON) packet 20 mEq Daily   ??? senna (SENOKOT) tablet 2 tablet Nightly   ??? sodium chloride tablet 1 g 3xd Meals   ??? thiamine mononitrate (vit B1) tablet 100 mg Daily  PRN acetaminophen, 650 mg, Q6H PRN  calcium carbonate, 200 mg of elem calcium, TID PRN  fluticasone propionate, 1 spray, Daily PRN  guaiFENesin, 200 mg, Q4H PRN  guaiFENesin, 100 mg, Q4H PRN  ibuprofen, 600 mg, Q6H PRN  loperamide, 4 mg, Q4H PRN  nicotine polacrilex, 2 mg, Q2H PRN  ondansetron, 4 mg, Q6H PRN  oxyCODONE, 5 mg, Q4H PRN   Or  oxyCODONE, 10 mg, Q4H PRN  simethicone, 80 mg, TID PRN      Continuous Infusions      Labs: I have reviewed the labs and studies from the last 24hrs.    CBC -   Recent Labs   Lab Units 07/25/20  0558 07/24/20  0623 07/23/20  0644   WBC 10*9/L 7.4 6.2 8.0   RBC 10*12/L 3.38* 3.47* 3.49*   HEMOGLOBIN g/dL 29.5* 62.1* 30.8*   HEMATOCRIT % 35.0* 35.4* 36.4*   MCV fL 103.6* 102.1* 104.4*   MCH pg 37.1* 36.4* 36.5*   MCHC g/dL 65.7 84.6 96.2   RDW % 14.6 15.1 14.8   PLATELET COUNT (1) 10*9/L 435 405 403   MPV fL 8.1 7.7 8.1     BMP -   Recent Labs   Lab Units 07/25/20  0558 07/24/20  0623 07/23/20  0644   SODIUM mmol/L 134* 135 134*   POTASSIUM mmol/L 4.0 4.1 4.3   CHLORIDE mmol/L 100 100 100   CO2 mmol/L 27.3 26.5 26.9   BUN mg/dL 8* 9 9   CREATININE mg/dL 9.52 8.41 3.24   GLUCOSE mg/dL 91 88 90       Radiology Results: Reviewed         Sunday Shams, MD  PGY-2 - Physical Medicine & Rehabilitation

## 2020-07-25 NOTE — Unmapped (Signed)
Pt A & O x 4.  Pt ambulatory with walker and SBA.  Pt continent of bowel and bladder.  Pt able to verbalize all wants and needs.  Chest vest therapy continues in 15 minute intervals.  Skin remains intact.  Pt with no significant changes in status, remained stable throughout shift.  Bed in lowest position, call bell in reach.

## 2020-07-25 NOTE — Unmapped (Signed)
07/24/20 2317   Vitals   Heart Rate 68   SpO2 94 %   Oxygen Therapy/Pulse Ox   O2 Device (S)  Nasal cannula   O2 Therapy Oxygen   O2 Flow Rate (L/min) 2 L/min   $$ Pulse Ox Charge Yes     Pt family has not brought in machine yet. Pt placed on 2L for sleeping per knowing pt from CCU.

## 2020-07-26 LAB — CBC W/ AUTO DIFF
BASOPHILS ABSOLUTE COUNT: 0.1 10*9/L (ref 0.0–0.1)
BASOPHILS RELATIVE PERCENT: 1 %
EOSINOPHILS ABSOLUTE COUNT: 0.2 10*9/L (ref 0.0–0.5)
EOSINOPHILS RELATIVE PERCENT: 2.6 %
HEMATOCRIT: 34.8 % — ABNORMAL LOW (ref 39.0–48.0)
HEMOGLOBIN: 12.4 g/dL — ABNORMAL LOW (ref 12.9–16.5)
LYMPHOCYTES ABSOLUTE COUNT: 1.6 10*9/L (ref 1.1–3.6)
LYMPHOCYTES RELATIVE PERCENT: 27.3 %
MEAN CORPUSCULAR HEMOGLOBIN CONC: 35.6 g/dL (ref 32.0–36.0)
MEAN CORPUSCULAR HEMOGLOBIN: 36.4 pg — ABNORMAL HIGH (ref 25.9–32.4)
MEAN CORPUSCULAR VOLUME: 102.5 fL — ABNORMAL HIGH (ref 77.6–95.7)
MEAN PLATELET VOLUME: 7.7 fL (ref 6.8–10.7)
MONOCYTES ABSOLUTE COUNT: 0.8 10*9/L (ref 0.3–0.8)
MONOCYTES RELATIVE PERCENT: 12.7 %
NEUTROPHILS ABSOLUTE COUNT: 3.4 10*9/L (ref 1.8–7.8)
NEUTROPHILS RELATIVE PERCENT: 56.4 %
NUCLEATED RED BLOOD CELLS: 0 /100{WBCs} (ref <=4)
PLATELET COUNT: 420 10*9/L (ref 150–450)
RED BLOOD CELL COUNT: 3.4 10*12/L — ABNORMAL LOW (ref 4.26–5.60)
RED CELL DISTRIBUTION WIDTH: 14.7 % (ref 12.2–15.2)
WBC ADJUSTED: 6 10*9/L (ref 3.6–11.2)

## 2020-07-26 LAB — BASIC METABOLIC PANEL
ANION GAP: 6 mmol/L (ref 5–14)
BLOOD UREA NITROGEN: 7 mg/dL — ABNORMAL LOW (ref 9–23)
BUN / CREAT RATIO: 11
CALCIUM: 8.6 mg/dL — ABNORMAL LOW (ref 8.7–10.4)
CHLORIDE: 99 mmol/L (ref 98–107)
CO2: 27 mmol/L (ref 20.0–31.0)
CREATININE: 0.63 mg/dL
EGFR CKD-EPI (2021) MALE: >90 mL/min/{1.73_m2} (ref >=60)
GLUCOSE RANDOM: 90 mg/dL (ref 70–179)
POTASSIUM: 4.1 mmol/L (ref 3.4–4.8)
SODIUM: 132 mmol/L — ABNORMAL LOW (ref 135–145)

## 2020-07-26 LAB — MAGNESIUM: MAGNESIUM: 1.6 mg/dL (ref 1.6–2.6)

## 2020-07-26 MED ADMIN — lidocaine (LIDODERM) 5 % patch 1 patch: 1 | TRANSDERMAL | @ 13:00:00

## 2020-07-26 MED ADMIN — gabapentin (NEURONTIN) capsule 300 mg: 300 mg | ORAL | @ 13:00:00

## 2020-07-26 MED ADMIN — divalproex ER (DEPAKOTE ER) extended release 24 hr tablet 750 mg: 750 mg | ORAL

## 2020-07-26 MED ADMIN — ibuprofen (MOTRIN) tablet 600 mg: 600 mg | ORAL | @ 13:00:00

## 2020-07-26 MED ADMIN — folic acid (FOLVITE) tablet 1 mg: 1 mg | ORAL | @ 13:00:00

## 2020-07-26 MED ADMIN — potassium chloride (KLOR-CON) packet 20 mEq: 20 meq | ORAL | @ 13:00:00

## 2020-07-26 MED ADMIN — nicotine (NICODERM CQ) 21 mg/24 hr patch 2 patch: 2 | TRANSDERMAL | @ 13:00:00

## 2020-07-26 MED ADMIN — magnesium oxide (MAG-OX) tablet 800 mg: 800 mg | ORAL

## 2020-07-26 MED ADMIN — acetaminophen (TYLENOL) tablet 650 mg: 650 mg | ORAL | @ 13:00:00

## 2020-07-26 MED ADMIN — naltrexone (DEPADE) tablet 50 mg: 50 mg | ORAL | @ 13:00:00

## 2020-07-26 MED ADMIN — gabapentin (NEURONTIN) capsule 300 mg: 300 mg | ORAL

## 2020-07-26 MED ADMIN — dilTIAZem (CARDIZEM CD) 24 hr capsule 360 mg: 360 mg | ORAL | @ 13:00:00

## 2020-07-26 MED ADMIN — docusate sodium (COLACE) capsule 100 mg: 100 mg | ORAL | @ 13:00:00

## 2020-07-26 MED ADMIN — pantoprazole (PROTONIX) EC tablet 40 mg: 40 mg | ORAL | @ 13:00:00

## 2020-07-26 MED ADMIN — sodium chloride tablet 1 g: 1 g | ORAL | @ 18:00:00

## 2020-07-26 MED ADMIN — polyethylene glycol (MIRALAX) packet 17 g: 17 g | ORAL | @ 18:00:00

## 2020-07-26 MED ADMIN — enoxaparin (LOVENOX) syringe 40 mg: 40 mg | SUBCUTANEOUS | @ 21:00:00

## 2020-07-26 MED ADMIN — sodium chloride tablet 1 g: 1 g | ORAL | @ 21:00:00

## 2020-07-26 MED ADMIN — guaiFENesin (ROBITUSSIN) oral syrup: 200 mg | ORAL | @ 14:00:00

## 2020-07-26 MED ADMIN — magnesium oxide (MAG-OX) tablet 800 mg: 800 mg | ORAL | @ 13:00:00

## 2020-07-26 MED ADMIN — melatonin tablet 3 mg: 3 mg | ORAL

## 2020-07-26 MED ADMIN — sodium chloride tablet 1 g: 1 g | ORAL | @ 13:00:00

## 2020-07-26 MED ADMIN — thiamine mononitrate (vit B1) tablet 100 mg: 100 mg | ORAL | @ 13:00:00

## 2020-07-26 MED ADMIN — OLANZapine (ZyPREXA) tablet 20 mg: 20 mg | ORAL
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
## 2020-07-26 NOTE — Unmapped (Signed)
Alert and oriented x4. Continent of bladder, uses the urinal. LBM 7/5. Refused his scheduled senokot. Wears his own CPAP at HS. Chest vest therapy was done. Will continue to monitor.     Problem: Rehabilitation (IRF) Plan of Care  Goal: Plan of Care Review  Outcome: Progressing  Goal: Patient-Specific Goal (Individualized)  Outcome: Progressing  Goal: Absence of New-Onset Illness or Injury  Outcome: Progressing  Intervention: Prevent Fall and Fall Injury  Recent Flowsheet Documentation  Taken 07/26/2020 0000 by Burnett Sheng Corliss Marcus, RN  Safety Interventions: fall reduction program maintained  Taken 07/25/2020 2200 by Burnett Sheng Corliss Marcus, RN  Safety Interventions: fall reduction program maintained  Taken 07/25/2020 2000 by Theador Hawthorne, RN  Safety Interventions: fall reduction program maintained  Intervention: Prevent VTE (Venous Thromboembolism)  Recent Flowsheet Documentation  Taken 07/25/2020 2100 by Theador Hawthorne, RN  VTE Prevention/Management: anticoagulant therapy  Goal: Optimal Comfort and Wellbeing  Outcome: Progressing  Goal: Home and Community Transition Plan Established  Outcome: Progressing

## 2020-07-26 NOTE — Unmapped (Signed)
Pt A & O x 4.  Pt ambulatory with walker.  Pt continent of bowel and bladder.  Pt with no skin concerns present.  Pt able to verbalize all needs.  Pt had no significant changes in status during shift, remained stable.  All needs were met.  Chest vest therapy continues as ordered, pt tolerates well.  Order was placed for foot/nail care appointment for pt by SN today.  Bed in lowest position, call bell in reach.

## 2020-07-26 NOTE — Unmapped (Signed)
Physical Medicine and Rehabilitation  PMR Daily Progress Note [facility]    ASSESSMENT:   Nicholas Rush is a 55 y.o. male with PMH of HTN, bipolar I??disorder, alcohol use??disorder c/b withdrawal seizures,??recurrent??C??diff??infection, who presented s/p witnessed grand mal seizure for monitored alcohol withdrawal.  ??  Rehab Impairment Group Code [facility]): (Neurologic Conditions) 03.9 Other Neurologic  Etiology:Neurological Change secondary to acute alcohol withdrawal complicated by seizure  PLAN:     This patient is admitted to the Physical Medicine and Rehabilitation - Inpatient - B service, please page [phone] from 8am-5pm on weekdays for questions regarding this patient. After hours and on weekends please contact the 1st call resident pager   ??  REHAB:   - PT and OT to maximize functional status with mobility and ADLs as well as prevention of joint contracture.   - SLP for cognitive and swallow function.  - Neuropsych for higher level cognitive evaluation and coping; notes mild neurocog deficits  - RT for community re-integration, education, and leisure support services.  - Nutrition consult for diet information/teaching.   - Discussed in weekly Interdisciplinary Team Conference set for 7/14  ??  Alcohol Use Disorder??-??hx complicated W/d  Patient with EtOH at 295 on admission. About 3 days into admission developed withdrawal symptoms including autonomic instability, tremor, hallucination, confusion, and agitation. He was given scheduled Ativan and had serial CIWA scoring with prn Ativan. During admission he was also treated with folate, IV thiamine, and home gabapentin. Discussed resources for sobriety and naltrexone.  - PO thiamine and folate daily  - zofran PRN  - melatonin at bedtime,??delirium precautions  -??Maintain gabapentin 300 mg??BID (6/30)  - Naltrexone 50 mg daily  ??  Concern for Aspiration Pneumonia - Coughn- OSA  On second day of withdrawal patient began to spike fevers. He was noted to have trouble with airway secretions and was producing purulent sputum. Started on Unasyn for empiric coverage of potential aspiration pneumonia. Fever seemed to track more with withdrawal process and patient appeared less infectious. He was transitioned to Augmentin to complete 5 days of antibiotics.   - Completed Abx course  - CXR 6/29 w/ improved consolidation  - Prn Robitussin syrup  - Incentive spirometry, chest physiotherapy  -??Regular diet per SLP recs  - CPAP nightly  ??  Paroxysmal Afib  Patient entered into Afib with RVR on third day of withdrawal symptoms. Unable to take PO at the time, he was started on diltiazem drip until he could take his home diltiazem PO. Patient was consistently hypokalemic and hypomagnesemic; repleted daily. Transitioned back to home oral diltiazem from drip.  - diltiazem 360 mg daily  - Replete K >4, Mg >2   -??Reported loose stools decrease Mag  - Loperamide 4 mg   - Consider repeat C diff if continued??  ??    ??  Myotonic Clonic Seizure   Seizure at home most likely provoked seizure 2/2 alcohol or infection. Spot EEG during recent hospitalization not suggestive of underlying pathology. Neurology consult suggested that presentation was most consistent with provoked seizure and recommended titration of depakote to therapeutic level.   ??  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 Patient had psych admission where they attempted to switch to lithium, but patient did not tolerate. He has not been stable on antipsychotic monotherapy and has had reasonable stability with subtherapeutic dose of VPA. He continued on home salt tabs.   - 1 g salt tab TID  -  CTM  ????  Lower Extremity Edema, resolved  New onset edema without other HF symptoms. Differential includes new onset HF, renal dysfunction (primarily proteinuria), and portal hypertension. Bedside ECHO unremarkable. UPC unremarkable. RUQ [place] mostly unremarkable and without portal HTN, but liver dysfunction could be contributory factor to hypoalbuminemia. Improved with Lasix  -Continue Lasix   ??  Bipolar I Disorder: well-controlled on Depakote and Zyprexa. VPA subtherapeutic, but will not increase due to concern for SIADH and decreasing sodium level.   - continue zyprexa 20mg  at bedtime  ??  Tobacco use disorder: Patient currently smoking??2.5 PPD. SW provided education and option for outpatient follow up for tobacco cessation on 6/21.  - Nicotine replacement therapy with patch 42 mg daily, prn gum    Diarrhea (resolved)  Patient with several c diff infections in the past. Bouts of loose stools while inpatient [facility]. 1BM recorded here. C diff negative, low concern for infectious cause.   -CTM  -Bowel reg  ??  Daily Checklist  - Diet:Regular   - DVT:SQ enoxaparin   - GI ZOX:WRUE  - Code: Full          SUBJECTIVE:     Interval History: NAEON. On RA. Discuss CPAP w/ nursing. Discuss substance support w/ CM.    ROS: Pertinent review of system completed and findings noted above    OBJECTIVE:     Vital signs (last 24 hours):   Temp:  [36.7 ??C] 36.7 ??C  Heart Rate:  [63-74] 74  Resp:  [18-19] 18  BP: (130-146)/(79-97) 138/97  MAP (mmHg):  [94-109] 109  SpO2:  [95 %-98 %] 95 %    Intake/Output:  I/O last 3 completed shifts:  In: -   Out: 800 [Urine:800]    Physical Exam:    GEN: NAD   EYES: sclera anicteric, conjunctiva clear   HENT: NCAT, MMM, OP clear  NECK: trachea midline  RESP: CTAB, NWOB   CV: RRR, no m/r/g, ext no c/c/e, radial and DPP 2+ b/l  GI: abd soft, NTND, NABS   GU: Foley in place with clear-yellow urine  SKIN: no visible masses, lesions, rashes, ecchymoses, or lacerations  MSK: FROM in BUE and BLE, no notable contractures, no visible swelling or erythema over joints, joints NTTP  NEURO: A&Ox3, 5/5 throughout for exception of 4+ R EF/EE.          PSYCH: mood euthymic, affect appropriate, thought process logical                 Medications:    Scheduled   ??? dilTIAZem (CARDIZEM CD) 24 hr capsule 360 mg Daily   ??? divalproex ER (DEPAKOTE ER) extended release 24 hr tablet 750 mg Nightly   ??? docusate sodium (COLACE) capsule 100 mg Daily   ??? enoxaparin (LOVENOX) syringe 40 mg Q24H   ??? folic acid (FOLVITE) tablet 1 mg Daily   ??? gabapentin (NEURONTIN) capsule 300 mg BID   ??? lidocaine (LIDODERM) 5 % patch 1 patch Daily   ??? magnesium oxide (MAG-OX) tablet 800 mg BID   ??? melatonin tablet 3 mg QPM   ??? naltrexone (DEPADE) tablet 50 mg Daily   ??? nicotine (NICODERM CQ) 21 mg/24 hr patch 2 patch Daily   ??? OLANZapine (ZyPREXA) tablet 20 mg Nightly   ??? pantoprazole (PROTONIX) EC tablet 40 mg Daily   ??? potassium chloride (KLOR-CON) packet 20 mEq Daily   ??? senna (SENOKOT) tablet 2 tablet Nightly   ??? sodium chloride tablet 1 g 3xd  Meals   ??? thiamine mononitrate (vit B1) tablet 100 mg Daily     PRN acetaminophen, 650 mg, Q6H PRN  calcium carbonate, 200 mg of elem calcium, TID PRN  fluticasone propionate, 1 spray, Daily PRN  guaiFENesin, 200 mg, Q4H PRN  guaiFENesin, 100 mg, Q4H PRN  ibuprofen, 600 mg, Q6H PRN  loperamide, 4 mg, Q4H PRN  nicotine polacrilex, 2 mg, Q2H PRN  ondansetron, 4 mg, Q6H PRN  oxyCODONE, 5 mg, Q4H PRN   Or  oxyCODONE, 10 mg, Q4H PRN  simethicone, 80 mg, TID PRN      Continuous Infusions      Labs: I have reviewed the labs and studies from the last 24hrs.    CBC -   Recent Labs   Lab Units 07/26/20  0728 07/25/20  0558 07/24/20  0623   WBC 10*9/L 6.0 7.4 6.2   RBC 10*12/L 3.40* 3.38* 3.47*   HEMOGLOBIN g/dL 29.5* 62.1* 30.8*   HEMATOCRIT % 34.8* 35.0* 35.4*   MCV fL 102.5* 103.6* 102.1*   MCH pg 36.4* 37.1* 36.4*   MCHC g/dL 65.7 84.6 96.2   RDW % 14.7 14.6 15.1   PLATELET COUNT (1) 10*9/L 420 435 405   MPV fL 7.7 8.1 7.7     BMP -   Recent Labs   Lab Units 07/26/20  0728 07/25/20  0558 07/24/20  0623   SODIUM mmol/L 132* 134* 135   POTASSIUM mmol/L 4.1 4.0 4.1   CHLORIDE mmol/L 99 100 100   CO2 mmol/L 27.0 27.3 26.5   BUN mg/dL 7* 8* 9   CREATININE mg/dL 9.52 8.41 3.24   GLUCOSE mg/dL 90 91 88       Radiology Results: Reviewed         Sunday Shams, MD  PGY-2 - Physical Medicine & Rehabilitation

## 2020-07-27 LAB — CBC W/ AUTO DIFF
BASOPHILS ABSOLUTE COUNT: 0.1 10*9/L (ref 0.0–0.1)
BASOPHILS RELATIVE PERCENT: 1.5 %
EOSINOPHILS ABSOLUTE COUNT: 0.2 10*9/L (ref 0.0–0.5)
EOSINOPHILS RELATIVE PERCENT: 2.4 %
HEMATOCRIT: 37 % — ABNORMAL LOW (ref 39.0–48.0)
HEMOGLOBIN: 12.9 g/dL (ref 12.9–16.5)
LYMPHOCYTES ABSOLUTE COUNT: 1.9 10*9/L (ref 1.1–3.6)
LYMPHOCYTES RELATIVE PERCENT: 29.6 %
MEAN CORPUSCULAR HEMOGLOBIN CONC: 34.8 g/dL (ref 32.0–36.0)
MEAN CORPUSCULAR HEMOGLOBIN: 35.9 pg — ABNORMAL HIGH (ref 25.9–32.4)
MEAN CORPUSCULAR VOLUME: 103.3 fL — ABNORMAL HIGH (ref 77.6–95.7)
MEAN PLATELET VOLUME: 7.8 fL (ref 6.8–10.7)
MONOCYTES ABSOLUTE COUNT: 0.9 10*9/L — ABNORMAL HIGH (ref 0.3–0.8)
MONOCYTES RELATIVE PERCENT: 14.1 %
NEUTROPHILS ABSOLUTE COUNT: 3.4 10*9/L (ref 1.8–7.8)
NEUTROPHILS RELATIVE PERCENT: 52.4 %
NUCLEATED RED BLOOD CELLS: 0 /100{WBCs} (ref <=4)
PLATELET COUNT: 396 10*9/L (ref 150–450)
RED BLOOD CELL COUNT: 3.59 10*12/L — ABNORMAL LOW (ref 4.26–5.60)
RED CELL DISTRIBUTION WIDTH: 14.7 % (ref 12.2–15.2)
WBC ADJUSTED: 6.4 10*9/L (ref 3.6–11.2)

## 2020-07-27 LAB — BASIC METABOLIC PANEL
ANION GAP: 7 mmol/L (ref 5–14)
BLOOD UREA NITROGEN: 6 mg/dL — ABNORMAL LOW (ref 9–23)
BUN / CREAT RATIO: 8
CALCIUM: 8.9 mg/dL (ref 8.7–10.4)
CHLORIDE: 99 mmol/L (ref 98–107)
CO2: 28.1 mmol/L (ref 20.0–31.0)
CREATININE: 0.74 mg/dL
EGFR CKD-EPI (2021) MALE: >90 mL/min/{1.73_m2} (ref >=60)
GLUCOSE RANDOM: 88 mg/dL (ref 70–179)
POTASSIUM: 4.3 mmol/L (ref 3.4–4.8)
SODIUM: 134 mmol/L — ABNORMAL LOW (ref 135–145)

## 2020-07-27 LAB — MAGNESIUM: MAGNESIUM: 1.7 mg/dL (ref 1.6–2.6)

## 2020-07-27 MED ADMIN — nicotine (NICODERM CQ) 21 mg/24 hr patch 2 patch: 2 | TRANSDERMAL | @ 14:00:00

## 2020-07-27 MED ADMIN — thiamine mononitrate (vit B1) tablet 100 mg: 100 mg | ORAL | @ 14:00:00

## 2020-07-27 MED ADMIN — divalproex ER (DEPAKOTE ER) extended release 24 hr tablet 750 mg: 750 mg | ORAL | @ 01:00:00

## 2020-07-27 MED ADMIN — polyethylene glycol (MIRALAX) packet 17 g: 17 g | ORAL | @ 16:00:00

## 2020-07-27 MED ADMIN — potassium chloride (KLOR-CON) packet 20 mEq: 20 meq | ORAL | @ 14:00:00

## 2020-07-27 MED ADMIN — magnesium oxide (MAG-OX) tablet 800 mg: 800 mg | ORAL | @ 01:00:00

## 2020-07-27 MED ADMIN — OLANZapine (ZyPREXA) tablet 20 mg: 20 mg | ORAL | @ 01:00:00

## 2020-07-27 MED ADMIN — folic acid (FOLVITE) tablet 1 mg: 1 mg | ORAL | @ 14:00:00

## 2020-07-27 MED ADMIN — sodium chloride tablet 1 g: 1 g | ORAL | @ 21:00:00

## 2020-07-27 MED ADMIN — naltrexone (DEPADE) tablet 50 mg: 50 mg | ORAL | @ 14:00:00

## 2020-07-27 MED ADMIN — melatonin tablet 3 mg: 3 mg | ORAL | @ 01:00:00

## 2020-07-27 MED ADMIN — sodium chloride tablet 1 g: 1 g | ORAL | @ 16:00:00

## 2020-07-27 MED ADMIN — pantoprazole (PROTONIX) EC tablet 40 mg: 40 mg | ORAL | @ 14:00:00

## 2020-07-27 MED ADMIN — docusate sodium (COLACE) capsule 100 mg: 100 mg | ORAL | @ 14:00:00

## 2020-07-27 MED ADMIN — dilTIAZem (CARDIZEM CD) 24 hr capsule 360 mg: 360 mg | ORAL | @ 14:00:00

## 2020-07-27 MED ADMIN — gabapentin (NEURONTIN) capsule 300 mg: 300 mg | ORAL | @ 01:00:00

## 2020-07-27 MED ADMIN — sodium chloride tablet 1 g: 1 g | ORAL | @ 14:00:00

## 2020-07-27 MED ADMIN — lidocaine (LIDODERM) 5 % patch 1 patch: 1 | TRANSDERMAL | @ 14:00:00

## 2020-07-27 MED ADMIN — magnesium oxide (MAG-OX) tablet 800 mg: 800 mg | ORAL | @ 14:00:00

## 2020-07-27 MED ADMIN — enoxaparin (LOVENOX) syringe 40 mg: 40 mg | SUBCUTANEOUS | @ 21:00:00

## 2020-07-27 MED ADMIN — gabapentin (NEURONTIN) capsule 300 mg: 300 mg | ORAL | @ 14:00:00

## 2020-07-27 NOTE — Unmapped (Signed)
Physical Medicine and Rehabilitation  PMR Daily Progress Note [facility]    ASSESSMENT:   Nicholas Rush is a 55 y.o. male with PMH of HTN, bipolar I??disorder, alcohol use??disorder c/b withdrawal seizures,??recurrent??C??diff??infection, who presented s/p witnessed grand mal seizure for monitored alcohol withdrawal.  ??  Rehab Impairment Group Code [facility]): (Neurologic Conditions) 03.9 Other Neurologic  Etiology:Neurological Change secondary to acute alcohol withdrawal complicated by seizure  PLAN:     Weekend Updates:  7/9 - NAEO. Doing well today, resting in bed.    This patient is admitted to the Physical Medicine and Rehabilitation - Inpatient - B service, please page [phone] from 8am-5pm on weekdays for questions regarding this patient. After hours and on weekends please contact the 1st call resident pager   ??  REHAB:   - PT and OT to maximize functional status with mobility and ADLs as well as prevention of joint contracture.   - SLP for cognitive and swallow function.  - Neuropsych for higher level cognitive evaluation and coping; notes mild neurocog deficits  - RT for community re-integration, education, and leisure support services.  - Nutrition consult for diet information/teaching.   - Discussed in weekly Interdisciplinary Team Conference set for 7/14  ??  Alcohol Use Disorder??-??hx complicated W/d  Patient with EtOH at 295 on admission. About 3 days into admission developed withdrawal symptoms including autonomic instability, tremor, hallucination, confusion, and agitation. He was given scheduled Ativan and had serial CIWA scoring with prn Ativan. During admission he was also treated with folate, IV thiamine, and home gabapentin. Discussed resources for sobriety and naltrexone.  - PO thiamine and folate daily  - zofran PRN  - melatonin at bedtime,??delirium precautions  -??Maintain gabapentin 300 mg??BID (6/30)  - Naltrexone 50 mg daily  ??  Concern for Aspiration Pneumonia - Coughn- OSA  On second day of withdrawal patient began to spike fevers. He was noted to have trouble with airway secretions and was producing purulent sputum. Started on Unasyn for empiric coverage of potential aspiration pneumonia. Fever seemed to track more with withdrawal process and patient appeared less infectious. He was transitioned to Augmentin to complete 5 days of antibiotics.   - Completed Abx course  - CXR 6/29 w/ improved consolidation  - Prn Robitussin syrup  - Incentive spirometry, chest physiotherapy  -??Regular diet per SLP recs  - CPAP nightly  ??  Paroxysmal Afib  Patient entered into Afib with RVR on third day of withdrawal symptoms. Unable to take PO at the time, he was started on diltiazem drip until he could take his home diltiazem PO. Patient was consistently hypokalemic and hypomagnesemic; repleted daily. Transitioned back to home oral diltiazem from drip.  - diltiazem 360 mg daily  - Replete K >4, Mg >2   -??Reported loose stools decrease Mag  - Loperamide 4 mg   - Consider repeat C diff if continued??  ??    ??  Myotonic Clonic Seizure   Seizure at home most likely provoked seizure 2/2 alcohol or infection. Spot EEG during recent hospitalization not suggestive of underlying pathology. Neurology consult suggested that presentation was most consistent with provoked seizure and recommended titration of depakote to therapeutic level.   ??  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 Patient had psych admission where they attempted to switch to lithium, but patient did not tolerate. He has not been stable on antipsychotic monotherapy and has had reasonable stability with subtherapeutic dose of VPA. He  continued on home salt tabs.   - 1 g salt tab TID  - CTM  ????  Lower Extremity Edema, resolved  New onset edema without other HF symptoms. Differential includes new onset HF, renal dysfunction (primarily proteinuria), and portal hypertension. Bedside ECHO unremarkable. UPC unremarkable. RUQ [place] mostly unremarkable and without portal HTN, but liver dysfunction could be contributory factor to hypoalbuminemia. Improved with Lasix  -Continue Lasix   ??  Bipolar I Disorder: well-controlled on Depakote and Zyprexa. VPA subtherapeutic, but will not increase due to concern for SIADH and decreasing sodium level.   - continue zyprexa 20mg  at bedtime  ??  Tobacco use disorder: Patient currently smoking??2.5 PPD. SW provided education and option for outpatient follow up for tobacco cessation on 6/21.  - Nicotine replacement therapy with patch 42 mg daily, prn gum    Diarrhea (resolved)  Patient with several c diff infections in the past. Bouts of loose stools while inpatient at [facility]. 1BM recorded here. C diff negative, low concern for infectious cause.   -CTM  -Bowel reg  ??  Daily Checklist  - Diet:Regular   - DVT:SQ enoxaparin   - GI AVW:[id number]  - Code: Full          SUBJECTIVE:     Interval History: NAEO. See above.  ROS: Pertinent review of system completed and findings noted above    OBJECTIVE:     Vital signs (last 24 hours):   Temp:  [36.5 ??C-36.7 ??C] 36.5 ??C  Heart Rate:  [69-81] 69  Resp:  [17-18] 17  BP: (134-141)/(87-97) 134/88  MAP (mmHg):  [100-109] 100  SpO2:  [92 %-96 %] 92 %    Intake/Output:  I/O last 3 completed shifts:  In: -   Out: 1325 [Urine:1325]    Physical Exam:    GEN: NAD   EYES: sclera anicteric, conjunctiva clear   HENT: NCAT, MMM, OP clear  NECK: trachea midline  RESP: CTAB, NWOB   CV: RRR, no m/r/g, ext no c/c/e, radial and DPP 2+ b/l  GI: abd soft, NTND, NABS   GU: Foley in place with clear-yellow urine  SKIN: no visible masses, lesions, rashes, ecchymoses, or lacerations  MSK: FROM in BUE and BLE, no notable contractures, no visible swelling or erythema over joints, joints NTTP  NEURO: A&Ox3, 5/5 throughout for exception of 4+ R EF/EE.          PSYCH: mood euthymic, affect appropriate, thought process logical                 Medications:    Scheduled   ??? dilTIAZem (CARDIZEM CD) 24 hr capsule 360 mg Daily   ??? divalproex ER (DEPAKOTE ER) extended release 24 hr tablet 750 mg Nightly   ??? docusate sodium (COLACE) capsule 100 mg Daily   ??? enoxaparin (LOVENOX) syringe 40 mg Q24H   ??? folic acid (FOLVITE) tablet 1 mg Daily   ??? gabapentin (NEURONTIN) capsule 300 mg BID   ??? lidocaine (LIDODERM) 5 % patch 1 patch Daily   ??? magnesium oxide (MAG-OX) tablet 800 mg BID   ??? melatonin tablet 3 mg QPM   ??? naltrexone (DEPADE) tablet 50 mg Daily   ??? nicotine (NICODERM CQ) 21 mg/24 hr patch 2 patch Daily   ??? OLANZapine (ZyPREXA) tablet 20 mg Nightly   ??? pantoprazole (PROTONIX) EC tablet 40 mg Daily   ??? polyethylene glycol (MIRALAX) packet 17 g Daily   ??? potassium chloride (KLOR-CON) packet 20 mEq Daily   ???  sodium chloride tablet 1 g 3xd Meals   ??? thiamine mononitrate (vit B1) tablet 100 mg Daily     PRN acetaminophen, 650 mg, Q6H PRN  calcium carbonate, 200 mg of elem calcium, TID PRN  fluticasone propionate, 1 spray, Daily PRN  guaiFENesin, 200 mg, Q4H PRN  guaiFENesin, 100 mg, Q4H PRN  ibuprofen, 600 mg, Q6H PRN  loperamide, 4 mg, Q4H PRN  nicotine polacrilex, 2 mg, Q2H PRN  ondansetron, 4 mg, Q6H PRN  oxyCODONE, 5 mg, Q4H PRN   Or  oxyCODONE, 10 mg, Q4H PRN  senna, 2 tablet, Nightly PRN  simethicone, 80 mg, TID PRN      Continuous Infusions      Labs: I have reviewed the labs and studies from the last 24hrs.    CBC -   Recent Labs   Lab Units 07/27/20  0708 07/26/20  0728 07/25/20  0558   WBC 10*9/L 6.4 6.0 7.4   RBC 10*12/L 3.59* 3.40* 3.38*   HEMOGLOBIN g/dL 21.3 08.6* 57.8*   HEMATOCRIT % 37.0* 34.8* 35.0*   MCV fL 103.3* 102.5* 103.6*   MCH pg 35.9* 36.4* 37.1*   MCHC g/dL 46.9 62.9 52.8   RDW % 14.7 14.7 14.6   PLATELET COUNT (1) 10*9/L 396 420 435   MPV fL 7.8 7.7 8.1     BMP -   Recent Labs   Lab Units 07/27/20  0708 07/26/20  0728 07/25/20  0558   SODIUM mmol/L 134* 132* 134*   POTASSIUM mmol/L 4.3 4.1 4.0   CHLORIDE mmol/L 99 99 100   CO2 mmol/L 28.1 27.0 27.3   BUN mg/dL 6* 7* 8*   CREATININE mg/dL 4.13 2.44 0.10   GLUCOSE mg/dL 88 90 91       Radiology Results: Reviewed

## 2020-07-27 NOTE — Unmapped (Signed)
Alert and oriented x4.Continent of bowel and bladder.On chest vest therapy.Uses own CPAP at night.Callbell within reach.Will continue to monitor.  Problem: Rehabilitation (IRF) Plan of Care  Goal: Plan of Care Review  Outcome: Progressing  Goal: Patient-Specific Goal (Individualized)  Outcome: Progressing  Goal: Absence of New-Onset Illness or Injury  Outcome: Progressing  Intervention: Prevent Fall and Fall Injury  Recent Flowsheet Documentation  Taken 07/27/2020 0200 by Barbaraann Cao, RN  Safety Interventions: fall reduction program maintained  Taken 07/27/2020 0000 by Barbaraann Cao, RN  Safety Interventions: fall reduction program maintained  Taken 07/26/2020 2200 by Barbaraann Cao, RN  Safety Interventions: fall reduction program maintained  Taken 07/26/2020 2000 by Barbaraann Cao, RN  Safety Interventions: fall reduction program maintained  Goal: Optimal Comfort and Wellbeing  Outcome: Progressing  Goal: Home and Community Transition Plan Established  Outcome: Progressing

## 2020-07-27 NOTE — Unmapped (Signed)
Alert and oriented x4.Continent of bowel and bladder. Utilized chest vest for 2x today, plans to use again tonight. Uses own CPAP at night. No c/o of pain. Safety and skin precautions in place and maintained. Hourly rounding performed by nursing staff.       Problem: Rehabilitation (IRF) Plan of Care  Goal: Plan of Care Review  Outcome: Progressing  Goal: Patient-Specific Goal (Individualized)  Outcome: Progressing  Goal: Absence of New-Onset Illness or Injury  Outcome: Progressing  Intervention: Prevent Fall and Fall Injury  Recent Flowsheet Documentation  Taken 07/27/2020 1600 by Fransisco Beau, RN  Safety Interventions: fall reduction program maintained  Taken 07/27/2020 1400 by Fransisco Beau, RN  Safety Interventions: fall reduction program maintained  Taken 07/27/2020 1200 by Fransisco Beau, RN  Safety Interventions: fall reduction program maintained  Taken 07/27/2020 1000 by Fransisco Beau, RN  Safety Interventions: fall reduction program maintained  Taken 07/27/2020 0800 by Fransisco Beau, RN  Safety Interventions: fall reduction program maintained  Goal: Optimal Comfort and Wellbeing  Outcome: Progressing  Goal: Home and Community Transition Plan Established  Outcome: Progressing     Problem: Self-Care Deficit  Goal: Improved Ability to Complete Activities of Daily Living  Outcome: Progressing     Problem: Fall Injury Risk  Goal: Absence of Fall and Fall-Related Injury  Outcome: Progressing  Intervention: Promote Injury-Free Environment  Recent Flowsheet Documentation  Taken 07/27/2020 1600 by Fransisco Beau, RN  Safety Interventions: fall reduction program maintained  Taken 07/27/2020 1400 by Fransisco Beau, RN  Safety Interventions: fall reduction program maintained  Taken 07/27/2020 1200 by Fransisco Beau, RN  Safety Interventions: fall reduction program maintained  Taken 07/27/2020 1000 by Fransisco Beau, RN  Safety Interventions: fall reduction program maintained  Taken 07/27/2020 0800 by Fransisco Beau, RN  Safety Interventions: fall reduction program maintained     Problem: Hypertension Comorbidity  Goal: Blood Pressure in Desired Range  Outcome: Progressing     Problem: Seizure Disorder Comorbidity  Goal: Maintenance of Seizure Control  Outcome: Progressing

## 2020-07-27 NOTE — Unmapped (Signed)
Pt A & O x 4.  Pt ambulatory with walker, continent of bowel and bladder.  Skin remains intact, no concerns.  Pt tolerates chest vest therapy well.  RT to see pt around 9 pm to assist with placing home CPAP.  Pt with no significant changes in status, remained stable throughout shift.  Bed in lowest position, call bell in reach.

## 2020-07-28 LAB — CBC W/ AUTO DIFF
BASOPHILS ABSOLUTE COUNT: 0.1 10*9/L (ref 0.0–0.1)
BASOPHILS RELATIVE PERCENT: 1 %
EOSINOPHILS ABSOLUTE COUNT: 0.2 10*9/L (ref 0.0–0.5)
EOSINOPHILS RELATIVE PERCENT: 2.3 %
HEMATOCRIT: 35.1 % — ABNORMAL LOW (ref 39.0–48.0)
HEMOGLOBIN: 12.3 g/dL — ABNORMAL LOW (ref 12.9–16.5)
LYMPHOCYTES ABSOLUTE COUNT: 2.4 10*9/L (ref 1.1–3.6)
LYMPHOCYTES RELATIVE PERCENT: 33.2 %
MEAN CORPUSCULAR HEMOGLOBIN CONC: 35.2 g/dL (ref 32.0–36.0)
MEAN CORPUSCULAR HEMOGLOBIN: 36.2 pg — ABNORMAL HIGH (ref 25.9–32.4)
MEAN CORPUSCULAR VOLUME: 102.9 fL — ABNORMAL HIGH (ref 77.6–95.7)
MEAN PLATELET VOLUME: 7.8 fL (ref 6.8–10.7)
MONOCYTES ABSOLUTE COUNT: 0.9 10*9/L — ABNORMAL HIGH (ref 0.3–0.8)
MONOCYTES RELATIVE PERCENT: 13.1 %
NEUTROPHILS ABSOLUTE COUNT: 3.6 10*9/L (ref 1.8–7.8)
NEUTROPHILS RELATIVE PERCENT: 50.4 %
NUCLEATED RED BLOOD CELLS: 0 /100{WBCs} (ref <=4)
PLATELET COUNT: 374 10*9/L (ref 150–450)
RED BLOOD CELL COUNT: 3.41 10*12/L — ABNORMAL LOW (ref 4.26–5.60)
RED CELL DISTRIBUTION WIDTH: 14.3 % (ref 12.2–15.2)
WBC ADJUSTED: 7.2 10*9/L (ref 3.6–11.2)

## 2020-07-28 LAB — BASIC METABOLIC PANEL
ANION GAP: 7 mmol/L (ref 5–14)
BLOOD UREA NITROGEN: 8 mg/dL — ABNORMAL LOW (ref 9–23)
BUN / CREAT RATIO: 11
CALCIUM: 8.6 mg/dL — ABNORMAL LOW (ref 8.7–10.4)
CHLORIDE: 101 mmol/L (ref 98–107)
CO2: 27 mmol/L (ref 20.0–31.0)
CREATININE: 0.71 mg/dL
EGFR CKD-EPI (2021) MALE: >90 mL/min/{1.73_m2} (ref >=60)
GLUCOSE RANDOM: 87 mg/dL (ref 70–179)
POTASSIUM: 4.1 mmol/L (ref 3.4–4.8)
SODIUM: 135 mmol/L (ref 135–145)

## 2020-07-28 LAB — MAGNESIUM: MAGNESIUM: 1.7 mg/dL (ref 1.6–2.6)

## 2020-07-28 MED ADMIN — sodium chloride tablet 1 g: 1 g | ORAL | @ 16:00:00

## 2020-07-28 MED ADMIN — acetaminophen (TYLENOL) tablet 650 mg: 650 mg | ORAL | @ 16:00:00

## 2020-07-28 MED ADMIN — gabapentin (NEURONTIN) capsule 300 mg: 300 mg | ORAL | @ 01:00:00

## 2020-07-28 MED ADMIN — ibuprofen (MOTRIN) tablet 600 mg: 600 mg | ORAL | @ 13:00:00

## 2020-07-28 MED ADMIN — nicotine (NICODERM CQ) 21 mg/24 hr patch 2 patch: 2 | TRANSDERMAL | @ 13:00:00

## 2020-07-28 MED ADMIN — guaiFENesin (ROBITUSSIN) oral syrup: 200 mg | ORAL | @ 14:00:00

## 2020-07-28 MED ADMIN — thiamine mononitrate (vit B1) tablet 100 mg: 100 mg | ORAL | @ 13:00:00

## 2020-07-28 MED ADMIN — polyethylene glycol (MIRALAX) packet 17 g: 17 g | ORAL | @ 16:00:00

## 2020-07-28 MED ADMIN — sodium chloride tablet 1 g: 1 g | ORAL | @ 22:00:00

## 2020-07-28 MED ADMIN — potassium chloride (KLOR-CON) packet 20 mEq: 20 meq | ORAL | @ 13:00:00

## 2020-07-28 MED ADMIN — docusate sodium (COLACE) capsule 100 mg: 100 mg | ORAL | @ 13:00:00

## 2020-07-28 MED ADMIN — gabapentin (NEURONTIN) capsule 300 mg: 300 mg | ORAL | @ 13:00:00

## 2020-07-28 MED ADMIN — magnesium oxide (MAG-OX) tablet 800 mg: 800 mg | ORAL | @ 13:00:00

## 2020-07-28 MED ADMIN — folic acid (FOLVITE) tablet 1 mg: 1 mg | ORAL | @ 13:00:00

## 2020-07-28 MED ADMIN — lidocaine (LIDODERM) 5 % patch 1 patch: 1 | TRANSDERMAL | @ 13:00:00

## 2020-07-28 MED ADMIN — ibuprofen (MOTRIN) tablet 600 mg: 600 mg | ORAL | @ 20:00:00

## 2020-07-28 MED ADMIN — dilTIAZem (CARDIZEM CD) 24 hr capsule 360 mg: 360 mg | ORAL | @ 13:00:00

## 2020-07-28 MED ADMIN — magnesium oxide (MAG-OX) tablet 800 mg: 800 mg | ORAL | @ 01:00:00

## 2020-07-28 MED ADMIN — divalproex ER (DEPAKOTE ER) extended release 24 hr tablet 750 mg: 750 mg | ORAL | @ 01:00:00

## 2020-07-28 MED ADMIN — naltrexone (DEPADE) tablet 50 mg: 50 mg | ORAL | @ 13:00:00

## 2020-07-28 MED ADMIN — pantoprazole (PROTONIX) EC tablet 40 mg: 40 mg | ORAL | @ 13:00:00

## 2020-07-28 MED ADMIN — acetaminophen (TYLENOL) tablet 650 mg: 650 mg | ORAL | @ 22:00:00

## 2020-07-28 MED ADMIN — OLANZapine (ZyPREXA) tablet 20 mg: 20 mg | ORAL | @ 01:00:00

## 2020-07-28 MED ADMIN — enoxaparin (LOVENOX) syringe 40 mg: 40 mg | SUBCUTANEOUS | @ 20:00:00

## 2020-07-28 MED ADMIN — melatonin tablet 3 mg: 3 mg | ORAL | @ 01:00:00

## 2020-07-28 MED ADMIN — sodium chloride tablet 1 g: 1 g | ORAL | @ 13:00:00

## 2020-07-28 NOTE — Unmapped (Signed)
Physical Medicine and Rehabilitation  PMR Daily Progress Note [facility]    ASSESSMENT:   Nicholas Rush is a 55 y.o. male with PMH of HTN, bipolar I??disorder, alcohol use??disorder c/b withdrawal seizures,??recurrent??C??diff??infection, who presented s/p witnessed grand mal seizure for monitored alcohol withdrawal.  ??  Rehab Impairment Group Code [facility]): (Neurologic Conditions) 03.9 Other Neurologic  Etiology:Neurological Change secondary to acute alcohol withdrawal complicated by seizure  PLAN:     Weekend Updates:  7/9 - NAEO. Doing well today, resting in bed.  7/10 - NAEO. Resting comfortably in bed.    This patient is admitted to the Physical Medicine and Rehabilitation - Inpatient - B service, please page [phone] from 8am-5pm on weekdays for questions regarding this patient. After hours and on weekends please contact the 1st call resident pager   ??  REHAB:   - PT and OT to maximize functional status with mobility and ADLs as well as prevention of joint contracture.   - SLP for cognitive and swallow function.  - Neuropsych for higher level cognitive evaluation and coping; notes mild neurocog deficits  - RT for community re-integration, education, and leisure support services.  - Nutrition consult for diet information/teaching.   - Discussed in weekly Interdisciplinary Team Conference set for 7/14  ??  Alcohol Use Disorder??-??hx complicated W/d  Patient with EtOH at 295 on admission. About 3 days into admission developed withdrawal symptoms including autonomic instability, tremor, hallucination, confusion, and agitation. He was given scheduled Ativan and had serial CIWA scoring with prn Ativan. During admission he was also treated with folate, IV thiamine, and home gabapentin. Discussed resources for sobriety and naltrexone.  - PO thiamine and folate daily  - zofran PRN  - melatonin at bedtime,??delirium precautions  -??Maintain gabapentin 300 mg??BID (6/30)  - Naltrexone 50 mg daily  ??  Concern for Aspiration Pneumonia - Coughn- OSA  On second day of withdrawal patient began to spike fevers. He was noted to have trouble with airway secretions and was producing purulent sputum. Started on Unasyn for empiric coverage of potential aspiration pneumonia. Fever seemed to track more with withdrawal process and patient appeared less infectious. He was transitioned to Augmentin to complete 5 days of antibiotics.   - Completed Abx course  - CXR 6/29 w/ improved consolidation  - Prn Robitussin syrup  - Incentive spirometry, chest physiotherapy  -??Regular diet per SLP recs  - CPAP nightly  ??  Paroxysmal Afib  Patient entered into Afib with RVR on third day of withdrawal symptoms. Unable to take PO at the time, he was started on diltiazem drip until he could take his home diltiazem PO. Patient was consistently hypokalemic and hypomagnesemic; repleted daily. Transitioned back to home oral diltiazem from drip.  - diltiazem 360 mg daily  - Replete K >4, Mg >2   -??Reported loose stools decrease Mag  - Loperamide 4 mg   - Consider repeat C diff if continued??  ??    ??  Myotonic Clonic Seizure   Seizure at home most likely provoked seizure 2/2 alcohol or infection. Spot EEG during recent hospitalization not suggestive of underlying pathology. Neurology consult suggested that presentation was most consistent with provoked seizure and recommended titration of depakote to therapeutic level.   ??  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 Patient had psych admission where they attempted to switch to lithium, but patient did not tolerate. He has not been stable on antipsychotic monotherapy and has had  reasonable stability with subtherapeutic dose of VPA. He continued on home salt tabs.   - 1 g salt tab TID  - CTM  ????  Lower Extremity Edema, resolved  New onset edema without other HF symptoms. Differential includes new onset HF, renal dysfunction (primarily proteinuria), and portal hypertension. Bedside ECHO unremarkable. UPC unremarkable. RUQ [place] mostly unremarkable and without portal HTN, but liver dysfunction could be contributory factor to hypoalbuminemia. Improved with Lasix  -Continue Lasix   ??  Bipolar I Disorder: well-controlled on Depakote and Zyprexa. VPA subtherapeutic, but will not increase due to concern for SIADH and decreasing sodium level.   - continue zyprexa 20mg  at bedtime  ??  Tobacco use disorder: Patient currently smoking??2.5 PPD. SW provided education and option for outpatient follow up for tobacco cessation on 6/21.  - Nicotine replacement therapy with patch 42 mg daily, prn gum    Diarrhea (resolved)  Patient with several c diff infections in the past. Bouts of loose stools while [facility]. 1BM recorded here. C diff negative, low concern for infectious cause.   -CTM  -Bowel reg  ??  Daily Checklist  - Diet:Regular   - DVT:SQ enoxaparin   - GI ZOX:WRUE  - Code: Full          SUBJECTIVE:     Interval History: NAEO. See above.  ROS: Pertinent review of system completed and findings noted above    OBJECTIVE:     Vital signs (last 24 hours):   Temp:  [36.4 ??C-36.7 ??C] 36.4 ??C  Heart Rate:  [62-65] 65  Resp:  [18] 18  BP: (125-132)/(68-79) 125/79  MAP (mmHg):  [87-92] 92  SpO2:  [94 %-95 %] 94 %    Intake/Output:  I/O last 3 completed shifts:  In: -   Out: 850 [Urine:850]    Physical Exam:    GEN: NAD   EYES: sclera anicteric, conjunctiva clear   HENT: NCAT, MMM, OP clear  NECK: trachea midline  RESP: CTAB, NWOB   CV: RRR, no m/r/g, ext no c/c/e, radial and DPP 2+ b/l  GI: abd soft, NTND, NABS   GU: Foley in place with clear-yellow urine  SKIN: no visible masses, lesions, rashes, ecchymoses, or lacerations  MSK: FROM in BUE and BLE, no notable contractures, no visible swelling or erythema over joints, joints NTTP  NEURO: A&Ox3, 5/5 throughout for exception of 4+ R EF/EE.          PSYCH: mood euthymic, affect appropriate, thought process logical Medications:    Scheduled   ??? dilTIAZem (CARDIZEM CD) 24 hr capsule 360 mg Daily   ??? divalproex ER (DEPAKOTE ER) extended release 24 hr tablet 750 mg Nightly   ??? docusate sodium (COLACE) capsule 100 mg Daily   ??? enoxaparin (LOVENOX) syringe 40 mg Q24H   ??? folic acid (FOLVITE) tablet 1 mg Daily   ??? gabapentin (NEURONTIN) capsule 300 mg BID   ??? lidocaine (LIDODERM) 5 % patch 1 patch Daily   ??? magnesium oxide (MAG-OX) tablet 800 mg BID   ??? melatonin tablet 3 mg QPM   ??? naltrexone (DEPADE) tablet 50 mg Daily   ??? nicotine (NICODERM CQ) 21 mg/24 hr patch 2 patch Daily   ??? OLANZapine (ZyPREXA) tablet 20 mg Nightly   ??? pantoprazole (PROTONIX) EC tablet 40 mg Daily   ??? polyethylene glycol (MIRALAX) packet 17 g Daily   ??? potassium chloride (KLOR-CON) packet 20 mEq Daily   ??? sodium chloride tablet 1 g 3xd Meals   ???  thiamine mononitrate (vit B1) tablet 100 mg Daily     PRN acetaminophen, 650 mg, Q6H PRN  calcium carbonate, 200 mg of elem calcium, TID PRN  fluticasone propionate, 1 spray, Daily PRN  guaiFENesin, 200 mg, Q4H PRN  guaiFENesin, 100 mg, Q4H PRN  ibuprofen, 600 mg, Q6H PRN  loperamide, 4 mg, Q4H PRN  nicotine polacrilex, 2 mg, Q2H PRN  ondansetron, 4 mg, Q6H PRN  oxyCODONE, 5 mg, Q4H PRN   Or  oxyCODONE, 10 mg, Q4H PRN  senna, 2 tablet, Nightly PRN  simethicone, 80 mg, TID PRN      Continuous Infusions      Labs: I have reviewed the labs and studies from the last 24hrs.    CBC -   Recent Labs   Lab Units 07/28/20  0532 07/27/20  0708 07/26/20  0728   WBC 10*9/L 7.2 6.4 6.0   RBC 10*12/L 3.41* 3.59* 3.40*   HEMOGLOBIN g/dL 16.1* 09.6 04.5*   HEMATOCRIT % 35.1* 37.0* 34.8*   MCV fL 102.9* 103.3* 102.5*   MCH pg 36.2* 35.9* 36.4*   MCHC g/dL 40.9 81.1 91.4   RDW % 14.3 14.7 14.7   PLATELET COUNT (1) 10*9/L 374 396 420   MPV fL 7.8 7.8 7.7     BMP -   Recent Labs   Lab Units 07/28/20  0532 07/27/20  0708 07/26/20  0728   SODIUM mmol/L 135 134* 132*   POTASSIUM mmol/L 4.1 4.3 4.1   CHLORIDE mmol/L 101 99 99   CO2 mmol/L 27.0 28.1 27.0   BUN mg/dL 8* 6* 7*   CREATININE mg/dL 7.82 9.56 2.13   GLUCOSE mg/dL 87 88 90       Radiology Results: Reviewed

## 2020-07-28 NOTE — Unmapped (Signed)
Patient is alert and oriented x4. Continent of bladder and bowel. Back pain controlled with prn Tylenol and ibuprofen. Very congested cough managed with robitussin and chest vet therapy. No new skin issues noted. Call bell and belongings within reach. No falls or injuries, this shift. No changes to the plan of care at this time.   Problem: Rehabilitation (IRF) Plan of Care  Goal: Plan of Care Review  Outcome: Progressing  Goal: Patient-Specific Goal (Individualized)  Outcome: Progressing  Goal: Absence of New-Onset Illness or Injury  Outcome: Progressing  Intervention: Prevent Fall and Fall Injury  Recent Flowsheet Documentation  Taken 07/28/2020 1200 by Wyvonna Plum, RN  Safety Interventions: fall reduction program maintained  Taken 07/28/2020 1000 by Wyvonna Plum, RN  Safety Interventions: fall reduction program maintained  Taken 07/28/2020 0800 by Wyvonna Plum, RN  Safety Interventions: fall reduction program maintained  Goal: Optimal Comfort and Wellbeing  Outcome: Progressing  Goal: Home and Community Transition Plan Established  Outcome: Progressing     Problem: Self-Care Deficit  Goal: Improved Ability to Complete Activities of Daily Living  Outcome: Progressing     Problem: Fall Injury Risk  Goal: Absence of Fall and Fall-Related Injury  Outcome: Progressing  Intervention: Promote Injury-Free Environment  Recent Flowsheet Documentation  Taken 07/28/2020 1200 by Wyvonna Plum, RN  Safety Interventions: fall reduction program maintained  Taken 07/28/2020 1000 by Wyvonna Plum, RN  Safety Interventions: fall reduction program maintained  Taken 07/28/2020 0800 by Wyvonna Plum, RN  Safety Interventions: fall reduction program maintained     Problem: Hypertension Comorbidity  Goal: Blood Pressure in Desired Range  Outcome: Progressing     Problem: Seizure Disorder Comorbidity  Goal: Maintenance of Seizure Control  Outcome: Progressing

## 2020-07-28 NOTE — Unmapped (Signed)
The patient's respiratory status this shift has been stable .     The patient is currently on room air.      The patients cough has been non-productive.    Additional interventions will occur as needed.     Home Unit at the bedside

## 2020-07-28 NOTE — Unmapped (Signed)
Alert and oriented x4.Uses CPAP intermittently.Falls precaution maintained.Callbell within reach.  Problem: Rehabilitation (IRF) Plan of Care  Goal: Plan of Care Review  Outcome: Progressing  Goal: Patient-Specific Goal (Individualized)  Outcome: Progressing  Goal: Absence of New-Onset Illness or Injury  Outcome: Progressing  Intervention: Prevent Fall and Fall Injury  Recent Flowsheet Documentation  Taken 07/28/2020 0200 by Barbaraann Cao, RN  Safety Interventions: fall reduction program maintained  Taken 07/28/2020 0000 by Barbaraann Cao, RN  Safety Interventions: fall reduction program maintained  Taken 07/27/2020 2200 by Barbaraann Cao, RN  Safety Interventions: fall reduction program maintained  Taken 07/27/2020 1950 by Barbaraann Cao, RN  Safety Interventions: fall reduction program maintained  Goal: Optimal Comfort and Wellbeing  Outcome: Progressing  Goal: Home and Community Transition Plan Established  Outcome: Progressing

## 2020-07-29 LAB — CBC W/ AUTO DIFF
BASOPHILS ABSOLUTE COUNT: 0.1 10*9/L (ref 0.0–0.1)
BASOPHILS RELATIVE PERCENT: 1.3 %
EOSINOPHILS ABSOLUTE COUNT: 0.2 10*9/L (ref 0.0–0.5)
EOSINOPHILS RELATIVE PERCENT: 2.7 %
HEMATOCRIT: 35.2 % — ABNORMAL LOW (ref 39.0–48.0)
HEMOGLOBIN: 12.2 g/dL — ABNORMAL LOW (ref 12.9–16.5)
LYMPHOCYTES ABSOLUTE COUNT: 2.2 10*9/L (ref 1.1–3.6)
LYMPHOCYTES RELATIVE PERCENT: 31.2 %
MEAN CORPUSCULAR HEMOGLOBIN CONC: 34.7 g/dL (ref 32.0–36.0)
MEAN CORPUSCULAR HEMOGLOBIN: 35.9 pg — ABNORMAL HIGH (ref 25.9–32.4)
MEAN CORPUSCULAR VOLUME: 103.4 fL — ABNORMAL HIGH (ref 77.6–95.7)
MEAN PLATELET VOLUME: 7.7 fL (ref 6.8–10.7)
MONOCYTES ABSOLUTE COUNT: 0.8 10*9/L (ref 0.3–0.8)
MONOCYTES RELATIVE PERCENT: 11.8 %
NEUTROPHILS ABSOLUTE COUNT: 3.7 10*9/L (ref 1.8–7.8)
NEUTROPHILS RELATIVE PERCENT: 53 %
NUCLEATED RED BLOOD CELLS: 0 /100{WBCs} (ref <=4)
PLATELET COUNT: 350 10*9/L (ref 150–450)
RED BLOOD CELL COUNT: 3.41 10*12/L — ABNORMAL LOW (ref 4.26–5.60)
RED CELL DISTRIBUTION WIDTH: 14.2 % (ref 12.2–15.2)
WBC ADJUSTED: 6.9 10*9/L (ref 3.6–11.2)

## 2020-07-29 LAB — BASIC METABOLIC PANEL
ANION GAP: 5 mmol/L (ref 5–14)
BLOOD UREA NITROGEN: 10 mg/dL (ref 9–23)
BUN / CREAT RATIO: 13
CALCIUM: 8.6 mg/dL — ABNORMAL LOW (ref 8.7–10.4)
CHLORIDE: 102 mmol/L (ref 98–107)
CO2: 28.1 mmol/L (ref 20.0–31.0)
CREATININE: 0.76 mg/dL
EGFR CKD-EPI (2021) MALE: >90 mL/min/{1.73_m2} (ref >=60)
GLUCOSE RANDOM: 81 mg/dL (ref 70–179)
POTASSIUM: 4.4 mmol/L (ref 3.4–4.8)
SODIUM: 135 mmol/L (ref 135–145)

## 2020-07-29 LAB — MAGNESIUM: MAGNESIUM: 1.8 mg/dL (ref 1.6–2.6)

## 2020-07-29 MED ADMIN — melatonin tablet 3 mg: 3 mg | ORAL | @ 01:00:00

## 2020-07-29 MED ADMIN — naltrexone (DEPADE) tablet 50 mg: 50 mg | ORAL | @ 12:00:00

## 2020-07-29 MED ADMIN — acetaminophen (TYLENOL) tablet 650 mg: 650 mg | ORAL | @ 12:00:00

## 2020-07-29 MED ADMIN — dilTIAZem (CARDIZEM CD) 24 hr capsule 360 mg: 360 mg | ORAL | @ 12:00:00

## 2020-07-29 MED ADMIN — pantoprazole (PROTONIX) EC tablet 40 mg: 40 mg | ORAL | @ 12:00:00

## 2020-07-29 MED ADMIN — sodium chloride tablet 1 g: 1 g | ORAL | @ 12:00:00

## 2020-07-29 MED ADMIN — guaiFENesin (ROBITUSSIN) oral syrup: 100 mg | ORAL | @ 02:00:00

## 2020-07-29 MED ADMIN — sodium chloride tablet 1 g: 1 g | ORAL | @ 21:00:00

## 2020-07-29 MED ADMIN — divalproex ER (DEPAKOTE ER) extended release 24 hr tablet 750 mg: 750 mg | ORAL | @ 01:00:00

## 2020-07-29 MED ADMIN — ibuprofen (MOTRIN) tablet 600 mg: 600 mg | ORAL | @ 12:00:00

## 2020-07-29 MED ADMIN — nicotine (NICODERM CQ) 21 mg/24 hr patch 2 patch: 2 | TRANSDERMAL | @ 12:00:00

## 2020-07-29 MED ADMIN — sodium chloride tablet 1 g: 1 g | ORAL | @ 18:00:00

## 2020-07-29 MED ADMIN — potassium chloride (KLOR-CON) packet 20 mEq: 20 meq | ORAL | @ 12:00:00

## 2020-07-29 MED ADMIN — polyethylene glycol (MIRALAX) packet 17 g: 17 g | ORAL | @ 18:00:00

## 2020-07-29 MED ADMIN — folic acid (FOLVITE) tablet 1 mg: 1 mg | ORAL | @ 12:00:00

## 2020-07-29 MED ADMIN — gabapentin (NEURONTIN) capsule 300 mg: 300 mg | ORAL | @ 01:00:00

## 2020-07-29 MED ADMIN — thiamine mononitrate (vit B1) tablet 100 mg: 100 mg | ORAL | @ 12:00:00

## 2020-07-29 MED ADMIN — gabapentin (NEURONTIN) capsule 300 mg: 300 mg | ORAL | @ 12:00:00

## 2020-07-29 MED ADMIN — docusate sodium (COLACE) capsule 100 mg: 100 mg | ORAL | @ 12:00:00

## 2020-07-29 MED ADMIN — acetaminophen (TYLENOL) tablet 650 mg: 650 mg | ORAL | @ 18:00:00

## 2020-07-29 MED ADMIN — magnesium oxide (MAG-OX) tablet 800 mg: 800 mg | ORAL | @ 12:00:00

## 2020-07-29 MED ADMIN — OLANZapine (ZyPREXA) tablet 20 mg: 20 mg | ORAL | @ 01:00:00

## 2020-07-29 MED ADMIN — enoxaparin (LOVENOX) syringe 40 mg: 40 mg | SUBCUTANEOUS | @ 21:00:00

## 2020-07-29 MED ADMIN — lidocaine (LIDODERM) 5 % patch 1 patch: 1 | TRANSDERMAL | @ 12:00:00

## 2020-07-29 MED ADMIN — magnesium oxide (MAG-OX) tablet 800 mg: 800 mg | ORAL | @ 01:00:00

## 2020-07-29 MED ADMIN — ibuprofen (MOTRIN) tablet 600 mg: 600 mg | ORAL | @ 18:00:00

## 2020-07-29 NOTE — Unmapped (Signed)
Physical Medicine and Rehabilitation  PMR Daily Progress Note [facility]    ASSESSMENT:   Nicholas Rush is a 55 y.o. male with PMH of HTN, bipolar I??disorder, alcohol use??disorder c/b withdrawal seizures,??recurrent??C??diff??infection, who presented s/p witnessed grand mal seizure for monitored alcohol withdrawal.  ??  Rehab Impairment Group Code [facility]): (Neurologic Conditions) 03.9 Other Neurologic  Etiology:Neurological Change secondary to acute alcohol withdrawal complicated by seizure  PLAN:     This patient is admitted to the Physical Medicine and Rehabilitation - Inpatient - B service, please page [phone] from 8am-5pm on weekdays for questions regarding this patient. After hours and on weekends please contact the 1st call resident pager   ??  REHAB:   - PT and OT to maximize functional status with mobility and ADLs as well as prevention of joint contracture.   - SLP for cognitive and swallow function.  - Neuropsych for higher level cognitive evaluation and coping; notes mild neurocog deficits  - RT for community re-integration, education, and leisure support services.  - Nutrition consult for diet information/teaching.   - Discussed in weekly Interdisciplinary Team Conference set for 7/14  ??  Alcohol Use Disorder??-??hx complicated W/d  Patient with EtOH at 295 on admission. About 3 days into admission developed withdrawal symptoms including autonomic instability, tremor, hallucination, confusion, and agitation. He was given scheduled Ativan and had serial CIWA scoring with prn Ativan. During admission he was also treated with folate, IV thiamine, and home gabapentin. Discussed resources for sobriety and naltrexone.  - PO thiamine and folate daily  - zofran PRN  - melatonin at bedtime,??delirium precautions  -??Maintain gabapentin 300 mg??BID (6/30)  - Naltrexone 50 mg daily  -SW assisting w/ [place] ASAP  ??  Concern for Aspiration Pneumonia - Coughn- OSA  On second day of withdrawal patient began to spike fevers. He was noted to have trouble with airway secretions and was producing purulent sputum. Started on Unasyn for empiric coverage of potential aspiration pneumonia. Fever seemed to track more with withdrawal process and patient appeared less infectious. He was transitioned to Augmentin to complete 5 days of antibiotics.   - Completed Abx course  - CXR 6/29 w/ improved consolidation  - Prn Robitussin syrup  - Incentive spirometry, chest physiotherapy  -??Regular diet per SLP recs  - CPAP nightly  ??  Paroxysmal Afib  Patient entered into Afib with RVR on third day of withdrawal symptoms. Unable to take PO at the time, he was started on diltiazem drip until he could take his home diltiazem PO. Patient was consistently hypokalemic and hypomagnesemic; repleted daily. Transitioned back to home oral diltiazem from drip.  - diltiazem 360 mg daily  - Replete K >4, Mg >2   -??Reported loose stools decrease Mag  - Loperamide 4 mg   - Consider repeat C diff if continued??  ??    ??  Myotonic Clonic Seizure   Seizure at home most likely provoked seizure 2/2 alcohol or infection. Spot EEG during recent hospitalization not suggestive of underlying pathology. Neurology consult suggested that presentation was most consistent with provoked seizure and recommended titration of depakote to therapeutic level.   ??  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 Patient had psych admission where they attempted to switch to lithium, but patient did not tolerate. He has not been stable on antipsychotic monotherapy and has had reasonable stability with subtherapeutic dose of VPA. He continued on home salt tabs.   - 1  g salt tab TID  - CTM  ????  Lower Extremity Edema, resolved  New onset edema without other HF symptoms. Differential includes new onset HF, renal dysfunction (primarily proteinuria), and portal hypertension. Bedside ECHO unremarkable. UPC unremarkable. RUQ [place] mostly unremarkable and without portal HTN, but liver dysfunction could be contributory factor to hypoalbuminemia. Improved with Lasix  -Continue Lasix   ??  Bipolar I Disorder: well-controlled on Depakote and Zyprexa. VPA subtherapeutic, but will not increase due to concern for SIADH and decreasing sodium level.   - continue zyprexa 20mg  at bedtime  ??  Tobacco use disorder: Patient currently smoking??2.5 PPD. SW provided education and option for outpatient follow up for tobacco cessation on 6/21.  - Nicotine replacement therapy with patch 42 mg daily, prn gum    Diarrhea (resolved)  Patient with several c diff infections in the past. Bouts of loose stools while inpatient at [facility]. 1BM recorded here. C diff negative, low concern for infectious cause.   -CTM  -Bowel reg  ??  Daily Checklist  - Diet:Regular   - DVT:SQ enoxaparin   - GI AVW:[id number]  - Code: Full          SUBJECTIVE:     Interval history: NAEON. Requests discharge Wednesday evening.  ROS: Pertinent review of system completed and findings noted above    OBJECTIVE:     Vital signs (last 24 hours):   Temp:  [36.5 ??C] 36.5 ??C  Heart Rate:  [58-75] 67  Resp:  [18-20] 20  BP: (121-127)/(67-75) 127/73  MAP (mmHg):  [85] 85  SpO2:  [94 %-98 %] 96 %    Intake/Output:  I/O last 3 completed shifts:  In: -   Out: 600 [Urine:600]    Physical Exam:    GEN: NAD   EYES: sclera anicteric, conjunctiva clear   HENT: NCAT, MMM, OP clear  NECK: trachea midline  RESP: CTAB, NWOB   CV: RRR, no m/r/g, ext no c/c/e, radial and DPP 2+ b/l  GI: abd soft, NTND, NABS   GU: Foley in place with clear-yellow urine  SKIN: no visible masses, lesions, rashes, ecchymoses, or lacerations  MSK: FROM in BUE and BLE, no notable contractures, no visible swelling or erythema over joints, joints NTTP  NEURO: A&Ox3, 5/5 throughout for exception of 4+ R EF/EE.          PSYCH: mood euthymic, affect appropriate, thought process logical                 Medications:    Scheduled   ??? dilTIAZem (CARDIZEM CD) 24 hr capsule 360 mg Daily   ??? divalproex ER (DEPAKOTE ER) extended release 24 hr tablet 750 mg Nightly   ??? docusate sodium (COLACE) capsule 100 mg Daily   ??? enoxaparin (LOVENOX) syringe 40 mg Q24H   ??? folic acid (FOLVITE) tablet 1 mg Daily   ??? gabapentin (NEURONTIN) capsule 300 mg BID   ??? lidocaine (LIDODERM) 5 % patch 1 patch Daily   ??? magnesium oxide (MAG-OX) tablet 800 mg BID   ??? melatonin tablet 3 mg QPM   ??? naltrexone (DEPADE) tablet 50 mg Daily   ??? nicotine (NICODERM CQ) 21 mg/24 hr patch 2 patch Daily   ??? OLANZapine (ZyPREXA) tablet 20 mg Nightly   ??? pantoprazole (PROTONIX) EC tablet 40 mg Daily   ??? polyethylene glycol (MIRALAX) packet 17 g Daily   ??? potassium chloride (KLOR-CON) packet 20 mEq Daily   ??? sodium chloride tablet 1 g 3xd Meals   ???  thiamine mononitrate (vit B1) tablet 100 mg Daily     PRN acetaminophen, 650 mg, Q6H PRN  calcium carbonate, 200 mg of elem calcium, TID PRN  fluticasone propionate, 1 spray, Daily PRN  guaiFENesin, 200 mg, Q4H PRN  guaiFENesin, 100 mg, Q4H PRN  ibuprofen, 600 mg, Q6H PRN  loperamide, 4 mg, Q4H PRN  nicotine polacrilex, 2 mg, Q2H PRN  ondansetron, 4 mg, Q6H PRN  oxyCODONE, 5 mg, Q4H PRN   Or  oxyCODONE, 10 mg, Q4H PRN  senna, 2 tablet, Nightly PRN  simethicone, 80 mg, TID PRN      Continuous Infusions      Labs: I have reviewed the labs and studies from the last 24hrs.    CBC -   Recent Labs   Lab Units 07/29/20  0655 07/28/20  0532 07/27/20  0708   WBC 10*9/L 6.9 7.2 6.4   RBC 10*12/L 3.41* 3.41* 3.59*   HEMOGLOBIN g/dL 16.1* 09.6* 04.5   HEMATOCRIT % 35.2* 35.1* 37.0*   MCV fL 103.4* 102.9* 103.3*   MCH pg 35.9* 36.2* 35.9*   MCHC g/dL 40.9 81.1 91.4   RDW % 14.2 14.3 14.7   PLATELET COUNT (1) 10*9/L 350 374 396   MPV fL 7.7 7.8 7.8     BMP -   Recent Labs   Lab Units 07/29/20  0655 07/28/20  0532 07/27/20  0708   SODIUM mmol/L 135 135 134*   POTASSIUM mmol/L 4.4 4.1 4.3   CHLORIDE mmol/L 102 101 99   CO2 mmol/L 28.1 27.0 28.1   BUN mg/dL 10 8* 6*   CREATININE mg/dL 7.82 9.56 2.13 GLUCOSE mg/dL 81 87 88       Radiology Results: Reviewed       Sunday Shams, MD  PGY-2 - Physical Medicine & Rehabilitation

## 2020-07-29 NOTE — Unmapped (Signed)
Patient is alert and oriented x4. Continent of bladder and bowel. LBM 07/26/20. Back pain controlled with prn Tylenol and ibuprofen. Chest vest therapy completed 2x during shift. No new skin issues noted. Safety precautions in place and maintained. No falls or injuries, this shift. No changes to the plan of care at this time. Hourly rounding perfomred by nursing staff.       Problem: Rehabilitation (IRF) Plan of Care  Goal: Plan of Care Review  Outcome: Progressing  Goal: Patient-Specific Goal (Individualized)  Outcome: Progressing  Goal: Absence of New-Onset Illness or Injury  Outcome: Progressing  Intervention: Prevent Fall and Fall Injury  Recent Flowsheet Documentation  Taken 07/29/2020 1200 by Fransisco Beau, RN  Safety Interventions: fall reduction program maintained  Taken 07/29/2020 1000 by Fransisco Beau, RN  Safety Interventions: fall reduction program maintained  Taken 07/29/2020 0800 by Fransisco Beau, RN  Safety Interventions: fall reduction program maintained  Goal: Optimal Comfort and Wellbeing  Outcome: Progressing  Goal: Home and Community Transition Plan Established  Outcome: Progressing     Problem: Self-Care Deficit  Goal: Improved Ability to Complete Activities of Daily Living  Outcome: Progressing     Problem: Fall Injury Risk  Goal: Absence of Fall and Fall-Related Injury  Outcome: Progressing  Intervention: Promote Injury-Free Environment  Recent Flowsheet Documentation  Taken 07/29/2020 1200 by Fransisco Beau, RN  Safety Interventions: fall reduction program maintained  Taken 07/29/2020 1000 by Fransisco Beau, RN  Safety Interventions: fall reduction program maintained  Taken 07/29/2020 0800 by Fransisco Beau, RN  Safety Interventions: fall reduction program maintained     Problem: Hypertension Comorbidity  Goal: Blood Pressure in Desired Range  Outcome: Progressing     Problem: Seizure Disorder Comorbidity  Goal: Maintenance of Seizure Control  Outcome: Progressing

## 2020-07-29 NOTE — Unmapped (Signed)
Alert and oriented x4.Chest vest therapy done.Robitussin syrup given for  cough with relief.Falls precaution maintained.Will continue to monitor.  Problem: Rehabilitation (IRF) Plan of Care  Goal: Plan of Care Review  Outcome: Progressing  Goal: Patient-Specific Goal (Individualized)  Outcome: Progressing  Goal: Absence of New-Onset Illness or Injury  Outcome: Progressing  Intervention: Prevent Fall and Fall Injury  Recent Flowsheet Documentation  Taken 07/29/2020 0200 by Barbaraann Cao, RN  Safety Interventions: fall reduction program maintained  Taken 07/29/2020 0000 by Barbaraann Cao, RN  Safety Interventions: fall reduction program maintained  Taken 07/28/2020 2200 by Barbaraann Cao, RN  Safety Interventions: fall reduction program maintained  Taken 07/28/2020 2000 by Barbaraann Cao, RN  Safety Interventions: fall reduction program maintained  Goal: Optimal Comfort and Wellbeing  Outcome: Progressing  Goal: Home and Community Transition Plan Established  Outcome: Progressing

## 2020-07-29 NOTE — Unmapped (Signed)
[facility] Consult Services  ADVANCED FOOT AND NAIL CARE CONSULT     Reason For Consult:  - Initial  - Nail Care    Assessment:  Received order for [facility] to complete Advanced Foot and Nail Care Service.  Patient reports that he has not been able to independently cut toenails in awhile.    The patient has never been to a Podiatrist.  The Patient is agreeable to [facility] completing nail care.    Nail Assessment:  -  Toe Nail:  unusually long  Feet dry, no calluses.    Procedure:  -  Patient in need of toenail cleaning and cutting only.  -  All debris removed from below nail surface with cuticle stick, nails cut with sterile clippers, then nails filed with nail file.  -  Feet cleaned and moisturizer applied.  -  Patient tolerated the procedure well    Instruments Used:  -  Sterile nail clippers  -  100/180 grit nail file  -  cuticle stick    Plan:  -  Instructed the Patient the risk of ulceration, infection, and trauma with overgrown nails. Reviewed the importance of following up with [facility], Podiatrist or PCP to continue to maintain nail care/length.    Workup Time:  45 minutes     Charissa Bash, RN, BSN, [employer], [facility]  Wound Ostomy Consult Service

## 2020-07-30 LAB — BASIC METABOLIC PANEL
ANION GAP: 8 mmol/L (ref 5–14)
BLOOD UREA NITROGEN: 9 mg/dL (ref 9–23)
BUN / CREAT RATIO: 13
CALCIUM: 8.7 mg/dL (ref 8.7–10.4)
CHLORIDE: 100 mmol/L (ref 98–107)
CO2: 24.8 mmol/L (ref 20.0–31.0)
CREATININE: 0.7 mg/dL
EGFR CKD-EPI (2021) MALE: >90 mL/min/{1.73_m2} (ref >=60)
GLUCOSE RANDOM: 132 mg/dL (ref 70–179)
POTASSIUM: 4 mmol/L (ref 3.4–4.8)
SODIUM: 133 mmol/L — ABNORMAL LOW (ref 135–145)

## 2020-07-30 LAB — CBC W/ AUTO DIFF
BASOPHILS ABSOLUTE COUNT: 0.1 10*9/L (ref 0.0–0.1)
BASOPHILS RELATIVE PERCENT: 1.3 %
EOSINOPHILS ABSOLUTE COUNT: 0.2 10*9/L (ref 0.0–0.5)
EOSINOPHILS RELATIVE PERCENT: 3.1 %
HEMATOCRIT: 36.5 % — ABNORMAL LOW (ref 39.0–48.0)
HEMOGLOBIN: 12.6 g/dL — ABNORMAL LOW (ref 12.9–16.5)
LYMPHOCYTES ABSOLUTE COUNT: 2.1 10*9/L (ref 1.1–3.6)
LYMPHOCYTES RELATIVE PERCENT: 31.4 %
MEAN CORPUSCULAR HEMOGLOBIN CONC: 34.6 g/dL (ref 32.0–36.0)
MEAN CORPUSCULAR HEMOGLOBIN: 35.4 pg — ABNORMAL HIGH (ref 25.9–32.4)
MEAN CORPUSCULAR VOLUME: 102.5 fL — ABNORMAL HIGH (ref 77.6–95.7)
MEAN PLATELET VOLUME: 7.7 fL (ref 6.8–10.7)
MONOCYTES ABSOLUTE COUNT: 0.8 10*9/L (ref 0.3–0.8)
MONOCYTES RELATIVE PERCENT: 12 %
NEUTROPHILS ABSOLUTE COUNT: 3.4 10*9/L (ref 1.8–7.8)
NEUTROPHILS RELATIVE PERCENT: 52.2 %
NUCLEATED RED BLOOD CELLS: 0 /100{WBCs} (ref <=4)
PLATELET COUNT: 320 10*9/L (ref 150–450)
RED BLOOD CELL COUNT: 3.56 10*12/L — ABNORMAL LOW (ref 4.26–5.60)
RED CELL DISTRIBUTION WIDTH: 14.7 % (ref 12.2–15.2)
WBC ADJUSTED: 6.6 10*9/L (ref 3.6–11.2)

## 2020-07-30 LAB — MAGNESIUM: MAGNESIUM: 1.7 mg/dL (ref 1.6–2.6)

## 2020-07-30 MED ADMIN — magnesium oxide (MAG-OX) tablet 800 mg: 800 mg | ORAL | @ 02:00:00

## 2020-07-30 MED ADMIN — pantoprazole (PROTONIX) EC tablet 40 mg: 40 mg | ORAL | @ 12:00:00

## 2020-07-30 MED ADMIN — sodium chloride tablet 1 g: 1 g | ORAL | @ 12:00:00

## 2020-07-30 MED ADMIN — folic acid (FOLVITE) tablet 1 mg: 1 mg | ORAL | @ 12:00:00

## 2020-07-30 MED ADMIN — naltrexone (DEPADE) tablet 50 mg: 50 mg | ORAL | @ 12:00:00

## 2020-07-30 MED ADMIN — docusate sodium (COLACE) capsule 100 mg: 100 mg | ORAL | @ 12:00:00

## 2020-07-30 MED ADMIN — ibuprofen (MOTRIN) tablet 600 mg: 600 mg | ORAL | @ 12:00:00

## 2020-07-30 MED ADMIN — gabapentin (NEURONTIN) capsule 300 mg: 300 mg | ORAL | @ 02:00:00

## 2020-07-30 MED ADMIN — sodium chloride tablet 1 g: 1 g | ORAL | @ 20:00:00

## 2020-07-30 MED ADMIN — polyethylene glycol (MIRALAX) packet 17 g: 17 g | ORAL | @ 18:00:00

## 2020-07-30 MED ADMIN — dilTIAZem (CARDIZEM CD) 24 hr capsule 360 mg: 360 mg | ORAL | @ 12:00:00

## 2020-07-30 MED ADMIN — nicotine (NICODERM CQ) 21 mg/24 hr patch 2 patch: 2 | TRANSDERMAL | @ 12:00:00

## 2020-07-30 MED ADMIN — lidocaine (LIDODERM) 5 % patch 1 patch: 1 | TRANSDERMAL | @ 12:00:00

## 2020-07-30 MED ADMIN — enoxaparin (LOVENOX) syringe 40 mg: 40 mg | SUBCUTANEOUS | @ 19:00:00

## 2020-07-30 MED ADMIN — gabapentin (NEURONTIN) capsule 300 mg: 300 mg | ORAL | @ 12:00:00

## 2020-07-30 MED ADMIN — OLANZapine (ZyPREXA) tablet 20 mg: 20 mg | ORAL | @ 02:00:00

## 2020-07-30 MED ADMIN — melatonin tablet 3 mg: 3 mg | ORAL | @ 02:00:00

## 2020-07-30 MED ADMIN — ibuprofen (MOTRIN) tablet 600 mg: 600 mg | ORAL | @ 19:00:00

## 2020-07-30 MED ADMIN — guaiFENesin (ROBITUSSIN) oral syrup: 200 mg | ORAL | @ 12:00:00

## 2020-07-30 MED ADMIN — thiamine mononitrate (vit B1) tablet 100 mg: 100 mg | ORAL | @ 12:00:00

## 2020-07-30 MED ADMIN — acetaminophen (TYLENOL) tablet 650 mg: 650 mg | ORAL | @ 19:00:00

## 2020-07-30 MED ADMIN — potassium chloride (KLOR-CON) packet 20 mEq: 20 meq | ORAL | @ 12:00:00

## 2020-07-30 MED ADMIN — guaiFENesin (ROBITUSSIN) oral syrup: 200 mg | ORAL | @ 19:00:00

## 2020-07-30 MED ADMIN — acetaminophen (TYLENOL) tablet 650 mg: 650 mg | ORAL | @ 12:00:00

## 2020-07-30 MED ADMIN — sodium chloride tablet 1 g: 1 g | ORAL | @ 18:00:00

## 2020-07-30 MED ADMIN — divalproex ER (DEPAKOTE ER) extended release 24 hr tablet 750 mg: 750 mg | ORAL | @ 02:00:00

## 2020-07-30 MED ADMIN — magnesium oxide (MAG-OX) tablet 800 mg: 800 mg | ORAL | @ 12:00:00

## 2020-07-30 NOTE — Unmapped (Signed)
Alert and oriented x4.Chest vest therapy done.Falls precaution maintained.Callbell within reach.Will continue to monitor.  Problem: Rehabilitation (IRF) Plan of Care  Goal: Plan of Care Review  Outcome: Progressing  Goal: Patient-Specific Goal (Individualized)  Outcome: Progressing  Goal: Absence of New-Onset Illness or Injury  Outcome: Progressing  Intervention: Prevent Fall and Fall Injury  Recent Flowsheet Documentation  Taken 07/30/2020 0000 by Barbaraann Cao, RN  Safety Interventions: fall reduction program maintained  Taken 07/29/2020 2200 by Barbaraann Cao, RN  Safety Interventions: fall reduction program maintained  Taken 07/29/2020 1930 by Barbaraann Cao, RN  Safety Interventions: fall reduction program maintained  Goal: Optimal Comfort and Wellbeing  Outcome: Progressing  Goal: Home and Community Transition Plan Established  Outcome: Progressing

## 2020-07-30 NOTE — Unmapped (Signed)
Pt reported that he isn't wearing his home unit due to an issue with his mask.  Pt stated he would get it sorted out when he was discharged.  Encouraged pt to speak with home health.  Pt resting comfortably.

## 2020-07-30 NOTE — Unmapped (Signed)
Physical Medicine and Rehabilitation  PMR Daily Progress Note [facility]    ASSESSMENT:   Nicholas Rush is a 55 y.o. male with PMH of HTN, bipolar I??disorder, alcohol use??disorder c/b withdrawal seizures,??recurrent??C??diff??infection, who presented s/p witnessed grand mal seizure for monitored alcohol withdrawal.  ??  Rehab Impairment Group Code [facility]): (Neurologic Conditions) 03.9 Other Neurologic  Etiology:Neurological Change secondary to acute alcohol withdrawal complicated by seizure  PLAN:     This patient is admitted to the Physical Medicine and Rehabilitation - Inpatient - B service, please page [phone] from 8am-5pm on weekdays for questions regarding this patient. After hours and on weekends please contact the 1st call resident pager   ??  REHAB:   - PT and OT to maximize functional status with mobility and ADLs as well as prevention of joint contracture.   - SLP for cognitive and swallow function.  - Neuropsych for higher level cognitive evaluation and coping; notes mild neurocog deficits  - RT for community re-integration, education, and leisure support services.  - Nutrition consult for diet information/teaching.   - Discussed in weekly Interdisciplinary Team Conference set for 7/13 PM  ??  Alcohol Use Disorder??-??hx complicated W/d  Patient with EtOH at 295 on admission. About 3 days into admission developed withdrawal symptoms including autonomic instability, tremor, hallucination, confusion, and agitation. He was given scheduled Ativan and had serial CIWA scoring with prn Ativan. During admission he was also treated with folate, IV thiamine, and home gabapentin. Discussed resources for sobriety and naltrexone.  - PO thiamine and folate daily  - zofran PRN  - melatonin at bedtime,??delirium precautions  -??Maintain gabapentin 300 mg??BID (6/30)  - Naltrexone 50 mg daily  -SW assisting w/ [facility] ASAP  ??  Concern for Aspiration Pneumonia - Coughn- OSA  On second day of withdrawal patient began to spike fevers. He was noted to have trouble with airway secretions and was producing purulent sputum. Started on Unasyn for empiric coverage of potential aspiration pneumonia. Fever seemed to track more with withdrawal process and patient appeared less infectious. He was transitioned to Augmentin to complete 5 days of antibiotics.   - Completed Abx course  - CXR 6/29 w/ improved consolidation  - Prn Robitussin syrup  - Incentive spirometry, chest physiotherapy  -??Regular diet per SLP recs  - CPAP nightly  ??  Paroxysmal Afib  Patient entered into Afib with RVR on third day of withdrawal symptoms. Unable to take PO at the time, he was started on diltiazem drip until he could take his home diltiazem PO. Patient was consistently hypokalemic and hypomagnesemic; repleted daily. Transitioned back to home oral diltiazem from drip.  - diltiazem 360 mg daily  - Replete K >4, Mg >2   -??Reported loose stools decrease Mag  - Loperamide 4 mg   - Consider repeat C diff if continued??  ??    ??  Myotonic Clonic Seizure   Seizure at home most likely provoked seizure 2/2 alcohol or infection. Spot EEG during recent hospitalization not suggestive of underlying pathology. Neurology consult suggested that presentation was most consistent with provoked seizure and recommended titration of depakote to therapeutic level.   ??  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 Patient had psych admission where they attempted to switch to lithium, but patient did not tolerate. He has not been stable on antipsychotic monotherapy and has had reasonable stability with subtherapeutic dose of VPA. He continued on home salt tabs.   -  1 g salt tab TID  - CTM  ????  Lower Extremity Edema, resolved  New onset edema without other HF symptoms. Differential includes new onset HF, renal dysfunction (primarily proteinuria), and portal hypertension. Bedside ECHO unremarkable. UPC unremarkable. RUQ [place] mostly unremarkable and without portal HTN, but liver dysfunction could be contributory factor to hypoalbuminemia. Improved with Lasix  -Continue Lasix   ??  Bipolar I Disorder: well-controlled on Depakote and Zyprexa. VPA subtherapeutic, but will not increase due to concern for SIADH and decreasing sodium level.   - continue zyprexa 20mg  at bedtime  ??  Tobacco use disorder: Patient currently smoking??2.5 PPD. SW provided education and option for outpatient follow up for tobacco cessation on 6/21.  - Nicotine replacement therapy with patch 42 mg daily, prn gum    Diarrhea (resolved)  Patient with several c diff infections in the past. Bouts of loose stools while inpatient at [facility]. 1BM recorded here. C diff negative, low concern for infectious cause.   -CTM  -Bowel reg  ??  Daily Checklist  - Diet:Regular   - DVT:SQ enoxaparin   - GI ZOX:WRUE  - Code: Full          SUBJECTIVE:     Interval history: NAEON. Requests discharge Wednesday evening.  ROS: Pertinent review of system completed and findings noted above    OBJECTIVE:     Vital signs (last 24 hours):   Temp:  [36.1 ??C] 36.1 ??C  Heart Rate:  [70] 70  Resp:  [18] 18  BP: (124-125)/(75-77) 125/77  SpO2:  [96 %] 96 %    Intake/Output:  I/O last 3 completed shifts:  In: -   Out: 1275 [Urine:1275]    Physical Exam:    GEN: NAD   EYES: sclera anicteric, conjunctiva clear   HENT: NCAT, MMM, OP clear  NECK: trachea midline  RESP: CTAB, NWOB   CV: RRR, no m/r/g, ext no c/c/e, radial and DPP 2+ b/l  GI: abd soft, NTND, NABS   GU: Foley in place with clear-yellow urine  SKIN: no visible masses, lesions, rashes, ecchymoses, or lacerations  MSK: FROM in BUE and BLE, no notable contractures, no visible swelling or erythema over joints, joints NTTP  NEURO: A&Ox3, 5/5 throughout for exception of 4+ R EF/EE.          PSYCH: mood euthymic, affect appropriate, thought process logical                 Medications:    Scheduled   ??? dilTIAZem (CARDIZEM CD) 24 hr capsule 360 mg Daily   ??? divalproex ER (DEPAKOTE ER) extended release 24 hr tablet 750 mg Nightly   ??? docusate sodium (COLACE) capsule 100 mg Daily   ??? enoxaparin (LOVENOX) syringe 40 mg Q24H   ??? folic acid (FOLVITE) tablet 1 mg Daily   ??? gabapentin (NEURONTIN) capsule 300 mg BID   ??? lidocaine (LIDODERM) 5 % patch 1 patch Daily   ??? magnesium oxide (MAG-OX) tablet 800 mg BID   ??? melatonin tablet 3 mg QPM   ??? naltrexone (DEPADE) tablet 50 mg Daily   ??? nicotine (NICODERM CQ) 21 mg/24 hr patch 2 patch Daily   ??? OLANZapine (ZyPREXA) tablet 20 mg Nightly   ??? pantoprazole (PROTONIX) EC tablet 40 mg Daily   ??? polyethylene glycol (MIRALAX) packet 17 g Daily   ??? potassium chloride (KLOR-CON) packet 20 mEq Daily   ??? sodium chloride tablet 1 g 3xd Meals   ??? thiamine mononitrate (vit B1)  tablet 100 mg Daily     PRN acetaminophen, 650 mg, Q6H PRN  calcium carbonate, 200 mg of elem calcium, TID PRN  fluticasone propionate, 1 spray, Daily PRN  guaiFENesin, 200 mg, Q4H PRN  guaiFENesin, 100 mg, Q4H PRN  ibuprofen, 600 mg, Q6H PRN  loperamide, 4 mg, Q4H PRN  nicotine polacrilex, 2 mg, Q2H PRN  ondansetron, 4 mg, Q6H PRN  oxyCODONE, 5 mg, Q4H PRN   Or  oxyCODONE, 10 mg, Q4H PRN  senna, 2 tablet, Nightly PRN  simethicone, 80 mg, TID PRN      Continuous Infusions      Labs: I have reviewed the labs and studies from the last 24hrs.    CBC -   Recent Labs   Lab Units 07/30/20  0706 07/29/20  0655 07/28/20  0532   WBC 10*9/L 6.6 6.9 7.2   RBC 10*12/L 3.56* 3.41* 3.41*   HEMOGLOBIN g/dL 16.1* 09.6* 04.5*   HEMATOCRIT % 36.5* 35.2* 35.1*   MCV fL 102.5* 103.4* 102.9*   MCH pg 35.4* 35.9* 36.2*   MCHC g/dL 40.9 81.1 91.4   RDW % 14.7 14.2 14.3   PLATELET COUNT (1) 10*9/L 320 350 374   MPV fL 7.7 7.7 7.8     BMP -   Recent Labs   Lab Units 07/30/20  0706 07/29/20  0655 07/28/20  0532   SODIUM mmol/L 133* 135 135   POTASSIUM mmol/L 4.0 4.4 4.1   CHLORIDE mmol/L 100 102 101   CO2 mmol/L 24.8 28.1 27.0   BUN mg/dL 9 10 8*   CREATININE mg/dL 7.82 9.56 2.13   GLUCOSE mg/dL 086 81 87 Radiology Results: Reviewed       Sunday Shams, MD  PGY-2 - Physical Medicine & Rehabilitation

## 2020-07-30 NOTE — Unmapped (Signed)
Patient is alert and oriented x4. Continent of bladder and bowel. LBM 07/26/20. Back pain controlled with prn Tylenol and ibuprofen. Chest vest therapy completed 2x during shift. PRN cough medication given to assist with cough, successful. No new skin issues noted. Safety precautions in place and maintained. No falls or injuries, this shift. No changes to the plan of care at this time. Hourly rounding perfomred by nursing staff.      Problem: Rehabilitation (IRF) Plan of Care  Goal: Plan of Care Review  Outcome: Progressing  Goal: Patient-Specific Goal (Individualized)  Outcome: Progressing  Goal: Absence of New-Onset Illness or Injury  Outcome: Progressing  Intervention: Prevent Fall and Fall Injury  Recent Flowsheet Documentation  Taken 07/30/2020 1600 by Fransisco Beau, RN  Safety Interventions: fall reduction program maintained  Taken 07/30/2020 1200 by Fransisco Beau, RN  Safety Interventions: fall reduction program maintained  Taken 07/30/2020 0800 by Fransisco Beau, RN  Safety Interventions: fall reduction program maintained  Goal: Optimal Comfort and Wellbeing  Outcome: Progressing  Goal: Home and Community Transition Plan Established  Outcome: Progressing     Problem: Self-Care Deficit  Goal: Improved Ability to Complete Activities of Daily Living  Outcome: Progressing     Problem: Fall Injury Risk  Goal: Absence of Fall and Fall-Related Injury  Outcome: Progressing  Intervention: Promote Injury-Free Environment  Recent Flowsheet Documentation  Taken 07/30/2020 1600 by Fransisco Beau, RN  Safety Interventions: fall reduction program maintained  Taken 07/30/2020 1200 by Fransisco Beau, RN  Safety Interventions: fall reduction program maintained  Taken 07/30/2020 0800 by Fransisco Beau, RN  Safety Interventions: fall reduction program maintained     Problem: Hypertension Comorbidity  Goal: Blood Pressure in Desired Range  Outcome: Progressing     Problem: Seizure Disorder Comorbidity  Goal: Maintenance of Seizure Control  Outcome: Progressing

## 2020-07-31 LAB — CBC W/ AUTO DIFF
BASOPHILS ABSOLUTE COUNT: 0 10*9/L (ref 0.0–0.1)
BASOPHILS RELATIVE PERCENT: 0.8 %
EOSINOPHILS ABSOLUTE COUNT: 0.2 10*9/L (ref 0.0–0.5)
EOSINOPHILS RELATIVE PERCENT: 3.7 %
HEMATOCRIT: 35.8 % — ABNORMAL LOW (ref 39.0–48.0)
HEMOGLOBIN: 12.6 g/dL — ABNORMAL LOW (ref 12.9–16.5)
LYMPHOCYTES ABSOLUTE COUNT: 2.1 10*9/L (ref 1.1–3.6)
LYMPHOCYTES RELATIVE PERCENT: 35.3 %
MEAN CORPUSCULAR HEMOGLOBIN CONC: 35.3 g/dL (ref 32.0–36.0)
MEAN CORPUSCULAR HEMOGLOBIN: 36.1 pg — ABNORMAL HIGH (ref 25.9–32.4)
MEAN CORPUSCULAR VOLUME: 102.3 fL — ABNORMAL HIGH (ref 77.6–95.7)
MEAN PLATELET VOLUME: 7.8 fL (ref 6.8–10.7)
MONOCYTES ABSOLUTE COUNT: 0.8 10*9/L (ref 0.3–0.8)
MONOCYTES RELATIVE PERCENT: 13 %
NEUTROPHILS ABSOLUTE COUNT: 2.8 10*9/L (ref 1.8–7.8)
NEUTROPHILS RELATIVE PERCENT: 47.2 %
NUCLEATED RED BLOOD CELLS: 0 /100{WBCs} (ref <=4)
PLATELET COUNT: 314 10*9/L (ref 150–450)
RED BLOOD CELL COUNT: 3.5 10*12/L — ABNORMAL LOW (ref 4.26–5.60)
RED CELL DISTRIBUTION WIDTH: 14.4 % (ref 12.2–15.2)
WBC ADJUSTED: 5.8 10*9/L (ref 3.6–11.2)

## 2020-07-31 LAB — BASIC METABOLIC PANEL
ANION GAP: 6 mmol/L (ref 5–14)
BLOOD UREA NITROGEN: 11 mg/dL (ref 9–23)
BUN / CREAT RATIO: 14
CALCIUM: 8.9 mg/dL (ref 8.7–10.4)
CHLORIDE: 102 mmol/L (ref 98–107)
CO2: 27.1 mmol/L (ref 20.0–31.0)
CREATININE: 0.77 mg/dL
EGFR CKD-EPI (2021) MALE: >90 mL/min/{1.73_m2} (ref >=60)
GLUCOSE RANDOM: 83 mg/dL (ref 70–179)
POTASSIUM: 4.5 mmol/L (ref 3.4–4.8)
SODIUM: 135 mmol/L (ref 135–145)

## 2020-07-31 LAB — MAGNESIUM: MAGNESIUM: 2 mg/dL (ref 1.6–2.6)

## 2020-07-31 MED ORDER — NALTREXONE 50 MG TABLET
ORAL_TABLET | Freq: Every day | ORAL | 0 refills | 30.00000 days | Status: CP
Start: 2020-07-31 — End: ?
  Filled 2020-07-31: qty 30, 30d supply, fill #0

## 2020-07-31 MED ORDER — SENNOSIDES 8.6 MG TABLET
ORAL_TABLET | Freq: Every evening | ORAL | 0 refills | 15 days | Status: CP | PRN
Start: 2020-07-31 — End: 2020-08-30
  Filled 2020-07-31: qty 30, 15d supply, fill #0

## 2020-07-31 MED ORDER — DOCUSATE SODIUM 100 MG CAPSULE
ORAL_CAPSULE | Freq: Every day | ORAL | 0 refills | 30.00000 days | Status: CP
Start: 2020-07-31 — End: 2020-08-30
  Filled 2020-07-31: qty 30, 30d supply, fill #0

## 2020-07-31 MED ORDER — DILTIAZEM ER 360 MG CAPSULE,24 HR,EXTENDED RELEASE
ORAL_CAPSULE | ORAL | 0 refills | 30 days | Status: CP
Start: 2020-07-31 — End: ?
  Filled 2020-07-31: qty 30, 30d supply, fill #0

## 2020-07-31 MED ORDER — CETIRIZINE 10 MG TABLET
ORAL_TABLET | Freq: Every day | ORAL | 0 refills | 30.00000 days | Status: CP
Start: 2020-07-31 — End: ?
  Filled 2020-07-31: qty 30, 30d supply, fill #0

## 2020-07-31 MED ORDER — THIAMINE MONONITRATE (VITAMIN B1) 100 MG TABLET
ORAL_TABLET | Freq: Every day | ORAL | 0 refills | 90.00000 days | Status: CP
Start: 2020-07-31 — End: ?

## 2020-07-31 MED ORDER — POLYETHYLENE GLYCOL 3350 17 GRAM ORAL POWDER PACKET
PACK | Freq: Every day | ORAL | 0 refills | 30 days | Status: CP
Start: 2020-07-31 — End: 2020-08-30
  Filled 2020-07-31: qty 30, 30d supply, fill #0

## 2020-07-31 MED ORDER — FOLIC ACID 1 MG TABLET
ORAL_TABLET | Freq: Every day | ORAL | 11 refills | 30 days | Status: CP
Start: 2020-07-31 — End: 2021-07-31
  Filled 2020-07-31: qty 30, 30d supply, fill #0

## 2020-07-31 MED ORDER — CALCIUM CARBONATE 500 MG-VITAMIN D3 5 MCG (200 UNIT) TABLET
ORAL_TABLET | Freq: Three times a day (TID) | ORAL | 0 refills | 10.00000 days | Status: CP
Start: 2020-07-31 — End: 2021-07-31

## 2020-07-31 MED ORDER — OLANZAPINE 20 MG TABLET
ORAL_TABLET | Freq: Every evening | ORAL | 0 refills | 30.00000 days | Status: CP
Start: 2020-07-31 — End: 2020-08-30
  Filled 2020-07-31: qty 30, 30d supply, fill #0

## 2020-07-31 MED ORDER — MAGNESIUM OXIDE 400 MG (241.3 MG MAGNESIUM) TABLET
ORAL_TABLET | Freq: Two times a day (BID) | ORAL | 11 refills | 30.00000 days | Status: CP
Start: 2020-07-31 — End: 2021-07-31
  Filled 2020-07-31: qty 120, 30d supply, fill #0

## 2020-07-31 MED ORDER — SODIUM CHLORIDE 1 GRAM TABLET
ORAL_TABLET | Freq: Three times a day (TID) | ORAL | 2 refills | 34.00000 days | Status: CP
Start: 2020-07-31 — End: 2021-07-31

## 2020-07-31 MED ORDER — FLUTICASONE PROPIONATE 50 MCG/ACTUATION NASAL SPRAY,SUSPENSION
Freq: Every day | NASAL | 12 refills | 0.00000 days | Status: CP
Start: 2020-07-31 — End: ?
  Filled 2020-07-31: qty 16, 30d supply, fill #0

## 2020-07-31 MED ORDER — NICOTINE 21 MG/24 HR DAILY TRANSDERMAL PATCH
MEDICATED_PATCH | Freq: Every day | TRANSDERMAL | 5 refills | 28 days | Status: CP
Start: 2020-07-31 — End: 2021-01-15
  Filled 2020-07-31: qty 28, 28d supply, fill #0

## 2020-07-31 MED ORDER — LOPERAMIDE 2 MG CAPSULE
ORAL_CAPSULE | ORAL | 0 refills | 3 days | Status: CP | PRN
Start: 2020-07-31 — End: 2020-08-30
  Filled 2020-07-31: qty 30, 3d supply, fill #0

## 2020-07-31 MED ORDER — MELATONIN 3 MG TABLET
ORAL_TABLET | Freq: Every evening | ORAL | 0 refills | 30.00000 days | Status: CP
Start: 2020-07-31 — End: 2020-08-30

## 2020-07-31 MED ORDER — DIVALPROEX ER 250 MG TABLET,EXTENDED RELEASE 24 HR
ORAL_TABLET | Freq: Every evening | ORAL | 0 refills | 30.00000 days | Status: CP
Start: 2020-07-31 — End: 2020-08-30
  Filled 2020-07-31: qty 70, 23d supply, fill #0

## 2020-07-31 MED ORDER — GABAPENTIN 300 MG CAPSULE
ORAL_CAPSULE | Freq: Two times a day (BID) | ORAL | 0 refills | 30 days | Status: CP
Start: 2020-07-31 — End: 2020-08-30
  Filled 2020-07-31: qty 60, 30d supply, fill #0

## 2020-07-31 MED ORDER — SIMETHICONE 80 MG CHEWABLE TABLET
ORAL_TABLET | Freq: Three times a day (TID) | ORAL | 0 refills | 10.00000 days | Status: CP | PRN
Start: 2020-07-31 — End: 2020-08-30

## 2020-07-31 MED ADMIN — ibuprofen (MOTRIN) tablet 600 mg: 600 mg | ORAL

## 2020-07-31 MED ADMIN — pantoprazole (PROTONIX) EC tablet 40 mg: 40 mg | ORAL | @ 13:00:00 | Stop: 2020-07-31

## 2020-07-31 MED ADMIN — sodium chloride tablet 1 g: 1 g | ORAL | @ 17:00:00 | Stop: 2020-07-31

## 2020-07-31 MED ADMIN — gabapentin (NEURONTIN) capsule 300 mg: 300 mg | ORAL

## 2020-07-31 MED ADMIN — polyethylene glycol (MIRALAX) packet 17 g: 17 g | ORAL | @ 17:00:00 | Stop: 2020-07-31

## 2020-07-31 MED ADMIN — naltrexone (DEPADE) tablet 50 mg: 50 mg | ORAL | @ 13:00:00 | Stop: 2020-07-31

## 2020-07-31 MED ADMIN — nicotine (NICODERM CQ) 21 mg/24 hr patch 2 patch: 2 | TRANSDERMAL | @ 13:00:00 | Stop: 2020-07-31

## 2020-07-31 MED ADMIN — ibuprofen (MOTRIN) tablet 600 mg: 600 mg | ORAL | @ 13:00:00 | Stop: 2020-07-31

## 2020-07-31 MED ADMIN — gabapentin (NEURONTIN) capsule 300 mg: 300 mg | ORAL | @ 13:00:00 | Stop: 2020-07-31

## 2020-07-31 MED ADMIN — lidocaine (LIDODERM) 5 % patch 1 patch: 1 | TRANSDERMAL | @ 13:00:00 | Stop: 2020-07-31

## 2020-07-31 MED ADMIN — divalproex ER (DEPAKOTE ER) extended release 24 hr tablet 750 mg: 750 mg | ORAL

## 2020-07-31 MED ADMIN — thiamine mononitrate (vit B1) tablet 100 mg: 100 mg | ORAL | @ 13:00:00 | Stop: 2020-07-31

## 2020-07-31 MED ADMIN — melatonin tablet 3 mg: 3 mg | ORAL | @ 21:00:00 | Stop: 2020-07-31

## 2020-07-31 MED ADMIN — docusate sodium (ENEMEEZ) enema 1 enema: 1 | RECTAL | @ 14:00:00 | Stop: 2020-07-31

## 2020-07-31 MED ADMIN — acetaminophen (TYLENOL) tablet 650 mg: 650 mg | ORAL

## 2020-07-31 MED ADMIN — magnesium oxide (MAG-OX) tablet 800 mg: 800 mg | ORAL

## 2020-07-31 MED ADMIN — folic acid (FOLVITE) tablet 1 mg: 1 mg | ORAL | @ 13:00:00 | Stop: 2020-07-31

## 2020-07-31 MED ADMIN — OLANZapine (ZyPREXA) tablet 20 mg: 20 mg | ORAL

## 2020-07-31 MED ADMIN — sodium chloride tablet 1 g: 1 g | ORAL | @ 13:00:00 | Stop: 2020-07-31

## 2020-07-31 MED ADMIN — enoxaparin (LOVENOX) syringe 40 mg: 40 mg | SUBCUTANEOUS | @ 21:00:00 | Stop: 2020-07-31

## 2020-07-31 MED ADMIN — docusate sodium (COLACE) capsule 100 mg: 100 mg | ORAL | @ 13:00:00 | Stop: 2020-07-31

## 2020-07-31 MED ADMIN — melatonin tablet 3 mg: 3 mg | ORAL

## 2020-07-31 MED ADMIN — acetaminophen (TYLENOL) tablet 650 mg: 650 mg | ORAL | @ 13:00:00 | Stop: 2020-07-31

## 2020-07-31 MED ADMIN — dilTIAZem (CARDIZEM CD) 24 hr capsule 360 mg: 360 mg | ORAL | @ 13:00:00 | Stop: 2020-07-31

## 2020-07-31 MED ADMIN — sodium chloride tablet 1 g: 1 g | ORAL | @ 21:00:00 | Stop: 2020-07-31

## 2020-07-31 MED ADMIN — guaiFENesin (ROBITUSSIN) oral syrup: 200 mg | ORAL | @ 13:00:00 | Stop: 2020-07-31

## 2020-07-31 MED ADMIN — magnesium oxide (MAG-OX) tablet 800 mg: 800 mg | ORAL | @ 13:00:00 | Stop: 2020-07-31

## 2020-07-31 MED ADMIN — potassium chloride (KLOR-CON) packet 20 mEq: 20 meq | ORAL | @ 13:00:00 | Stop: 2020-07-31

## 2020-07-31 NOTE — Unmapped (Signed)
Patient is s/p witnessed grand mal seizure for monitored alcohol withdrawal, remains alert and oriented x4. Back pain controlled with prn Tylenol and ibuprofen, Pt reporting great relief, chest vest therapy completed 1x this shift. Sitting on the chair most hours during the beginning of the shift, remain free from falls or injuries, this shift, safety measures remain in place, will continue to monitor.     Problem: Seizure Disorder Comorbidity  Goal: Maintenance of Seizure Control  Outcome: Progressing     Problem: Fall Injury Risk  Goal: Absence of Fall and Fall-Related Injury  Intervention: Promote Injury-Free Environment  Recent Flowsheet Documentation  Taken 07/31/2020 0200 by Ander Purpura, RN  Safety Interventions:   seizure precautions   low bed   fall reduction program maintained  Taken 07/31/2020 0000 by Ander Purpura, RN  Safety Interventions: seizure precautions  Taken 07/30/2020 2200 by Ander Purpura, RN  Safety Interventions: seizure precautions  Taken 07/30/2020 2000 by Ander Purpura, RN  Safety Interventions:   low bed   fall reduction program maintained   seizure precautions     Problem: Rehabilitation (IRF) Plan of Care  Goal: Optimal Comfort and Wellbeing  Outcome: Progressing

## 2020-07-31 NOTE — Unmapped (Addendum)
Discharge Services and Resources:  ??  Outpatient Therapy appointments:   ??  As you requested, you are to call [facility] to schedule your PT and OT appointments after you follow up with [facility] transportation.   ??   [facility] ??  [address] [place], [place] ??   [phone]  ????  If you have any questions regarding outpatient therapy, please call the above number.   ??  A referral has been made to [facility] as noted below. Please call this agency to set up an appointment.    ??  Service Provider Selected Services Address Phone Fax Patient Preferred   ?? [facility] ([facility])?? [facility] , ??[facility] [address], [place] [address] [phone]   ??     Personal Care Services:  A referral has been made to [facility] requesting an evaluation for Personal Care Services. You will need to contact [employer] at [phone] to restart the application process. Your application is currently on hold due to your recent hospitalization.   ??  If you have any questions??regarding home health please call [phone].??     Disability Parking Placard  You have received an application for a disability parking placard that you can obtain at your local [employer] for $5.    ??  Vocational Rehabilitation:   You may find you need additional support to return to independent living. [facility] provides counseling, training, education, transportation, job placement, Designer, multimedia, home modifications and other support services.????These services are provided to people with new physical and/or cognitive difficulties to assist them with living independently and/or returning to work. You can call the main number below and they will link you to your local office for an evaluation:  ??  Main Office  [address] [place], [place] [address]  Telephone: [phone]     Reminder:   Please follow up with your county Social Service office regarding your Medicaid and Disability application status. You must complete your Disability application with your county Social Security office and a disability determination will be made.

## 2020-07-31 NOTE — Unmapped (Signed)
Physical Medicine and Rehab  Discharge Summary [facility]    Patient Name: Nicholas Rush       Medical Record Number: [id number]   Date of Birth: September 23, 1965  Sex: Male          Room/Bed: 5A213/0Q657-84  Payor Info: Payor: MEDICAID [place] / Plan: MEDICAID [place] ACCESS / Product Type: *No Product type* /      Admit Date: 07/20/2020  Discharge Date: 07/31/2020    Admitting Physician: Merrily Brittle, DO  Attending Provider: Merrily Brittle, DO   Discharge Physician: Merrily Brittle, DO    Admitting Diagnosis: (Neurologic Conditions) 03.9 Other Neurologic  Discharge Diagnoses: (Neurologic Conditions) 03.9 Other Neurologic  Secondary Diagnoses:   Active Problems:    * No active hospital problems. *  Resolved Problems:    * No resolved hospital problems. *    Admission Functional Status: Discharge Functional Status:   ADLs - Needs Assistance: Feeding; Toileting  ??  Feeding - Needs Assistance: Set Up Assist  ??  Grooming - Needs Assistance: Set Up Assist  ??  Bathing - Needs Assistance: Mod assist  ??  Toileting - Needs Assistance: Mod assist  ??  UB Dressing - Needs Assistance: Set Up Assist  ??  LB Dressing - Needs Assistance: Min assist  ??  ??  Mobility:??  Bed Mobility: Min A  ??  Transfers: Min A  ??  Gait: amb w/ RW, min A, for balance and RW management, 30' x 2 w/ sitting rest break. ??Mod VCs for step length, balance, RW distance.  ??  Stairs: NT  ??  Wheelchair Mobility: NT  ??  ??  Cognition/Swallow/Speech:??AOx4, clear speech, following commands, regular diet Therapy Updates:    Team Conference Update (PT): supervision for bed mobility, CGA for OOB transfers with RW, ambulating 100 feet with RW And CGA, negotiated 12 steps with CGA; barriers: decreased dynamic balance: ELOS: 10-12 days  -----------------------------------------------------------------------------------------------------  Team Conference Update (OT): SU grooming, MIN-MOD A LB dressing, CGA/MIN A for standing at sink & functional mobility using RW, deficits in safety awareness and problem solving skills  -----------------------------------------------------------------------------------------------------  Team Conference Update (SLP): Tolerating regular diet. SLP signed off.             Indication for Admission / HPI:     Nicholas Rush is a 55 year old male with a past medical history significant for HTN, bipolar I disorder, alcohol use disorder c/b withdrawal seizures, recurrent C diff infection, who presents s/p witnessed grand mal seizure for monitored alcohol withdrawal. [name] has participated in acute inpatient physical and occupational therapies to improve functional mobility, activity tolerance, functional strength, balance, and endurance in order to facilitate safe performance of ADLs and daily routines. ??He participated in acute inpatient speech-language therapy for executive functioning, expressive speech, receptive speech, and dysphagia management. [name] has been referred to [facility] for continued acute medical management, provision of intensive inpatient therapies, and patient/family training to facilitate safe performance of ADLs and mobility, including functional cognition and self-care prior to discharge home.    Hospital Course:     Outpatient:   [  ] Hyponatremic, CTM labs    Nicholas Rush is a 55 y.o. y/o male with PMHx as noted below that presented to [facility] for debility. Hospital course progressed as below listed by problem:    Alcohol Use Disorder??-??hx complicated W/d  Patient with EtOH at 295 on admission. About 3 days into admission developed withdrawal  symptoms including autonomic instability, tremor, hallucination, confusion, and agitation. He was given scheduled Ativan and had serial CIWA scoring with prn Ativan. During admission he was also treated with folate, IV thiamine, and home gabapentin. Discussed resources for sobriety and naltrexone.  - PO thiamine and folate daily  - zofran PRN  - melatonin at bedtime,??delirium precautions  -??Maintain gabapentin 300 mg??BID (6/30)  - Naltrexone 50 mg daily  - SW 7/15 [place] ASAP  ??  Concern for Aspiration Pneumonia - Coughn- OSA  On second day of withdrawal patient began to spike fevers. He was noted to have trouble with airway secretions and was producing purulent sputum. Started on Unasyn for empiric coverage of potential aspiration pneumonia. Fever seemed to track more with withdrawal process and patient appeared less infectious. He was transitioned to Augmentin to complete 5 days of antibiotics.   - Completed Abx course  - CXR 6/29 w/ improved consolidation  - Prn Robitussin syrup  - Incentive spirometry, chest physiotherapy; IS on discharge  -??Regular diet per SLP recs  - CPAP nightly  ??  Paroxysmal Afib  Patient entered into Afib with RVR on third day of withdrawal symptoms. Unable to take PO at the time, he was started on diltiazem drip until he could take his home diltiazem PO. Patient was consistently hypokalemic and hypomagnesemic; repleted daily. Transitioned back to home oral diltiazem from drip.  - diltiazem 360 mg daily  - Replete K >4, Mg >2   -??Mag on discharge  - Loperamide 4 mg   - Consider repeat C diff if continued??  ??  ??  Myotonic Clonic Seizure   Seizure at home most likely provoked seizure 2/2 alcohol or infection. Spot EEG during recent hospitalization not suggestive of underlying pathology. Neurology consult suggested that presentation was most consistent with provoked seizure and recommended titration of depakote to therapeutic level.   ??  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 Patient had psych admission where they attempted to switch to lithium, but patient did not tolerate. He has not been stable on antipsychotic monotherapy and has had reasonable stability with subtherapeutic dose of VPA. He continued on home salt tabs.   - 1 g salt tab TID; continued on discharge  - CTM  ????  Lower Extremity Edema, resolved  New onset edema without other HF symptoms. Differential includes new onset HF, renal dysfunction (primarily proteinuria), and portal hypertension. Bedside ECHO unremarkable. UPC unremarkable. RUQ [place] mostly unremarkable and without portal HTN, but liver dysfunction could be contributory factor to hypoalbuminemia. Improved with Lasix  -Continue Lasix   ??  Bipolar I Disorder: well-controlled on Depakote and Zyprexa. VPA subtherapeutic, but will not increase due to concern for SIADH and decreasing sodium level.   - continue zyprexa 20mg  at bedtime  ??  Tobacco use disorder: Patient currently smoking??2.5 PPD. SW provided education and option for outpatient follow up for tobacco cessation on 6/21.  - Nicotine replacement therapy with patch 42 mg daily, prn gum  ??  Diarrhea (resolved)  Patient with several c diff infections in the past. Bouts of loose stools while inpatient at [facility]. 1BM recorded here. C diff negative, low concern for infectious cause.   -CTM  -Bowel reg    Discharge Medications:      Your Medication List      START taking these medications    docusate sodium 100 MG capsule  Commonly known as: COLACE  Take 1 capsule (100 mg total) by  mouth in the morning.     folic acid 1 MG tablet  Commonly known as: FOLVITE  Take 1 tablet (1 mg total) by mouth in the morning.     loperamide 2 mg capsule  Commonly known as: IMODIUM  Take 2 capsules (4 mg total) by mouth every four (4) hours as needed.     magnesium oxide 400 mg (241.3 mg elemental) tablet  Commonly known as: MAG-OX  Take 2 tablets (800 mg total) by mouth Two (2) times a day.     polyethylene glycol 17 gram packet  Commonly known as: MIRALAX  Mix contents of 1 packet (17 grams) in 4 to 8 ounces of liquid and drink daily.     senna 8.6 mg tablet  Commonly known as: SENNA  Take 2 tablets by mouth nightly as needed for constipation.     simethicone 80 MG chewable tablet  Commonly known as: MYLICON  Chew 1 tablet (80 mg total) Three (3) times a day as needed.        CHANGE how you take these medications    calcium-vitamin D 500 mg-5 mcg (200 unit) per tablet  Commonly known as: calcium-vitamin D  Take 1 tablet by mouth Three (3) times a day with a meal.  What changed:   ?? medication strength  ?? when to take this     gabapentin 300 MG capsule  Commonly known as: NEURONTIN  Take 1 capsule (300 mg total) by mouth Two (2) times a day.  What changed:   ?? medication strength  ?? how much to take        CONTINUE taking these medications    acetaminophen 500 MG tablet  Commonly known as: TYLENOL  Take 500 mg by mouth every six (6) hours as needed for pain.     adalimumab 40 mg/0.4 mL injection  Commonly known as: HUMIRA  Inject the contents of 1 syringe (40 mg) under the skin every 14 days     cetirizine 10 MG tablet  Commonly known as: ZyrTEC  Take 1 tablet (10 mg total) by mouth in the morning.     diltiazem 360 MG 24 hr capsule  Commonly known as: CARDIZEM CD  Take 1 capsule (360 mg total) by mouth daily.     divalproex ER 250 MG extended release 24 hr tablet  Commonly known as: DEPAKOTE ER  Take 3 tablets (750 mg total) by mouth nightly.     fluticasone propionate 50 mcg/actuation nasal spray  Commonly known as: FLONASE  2 sprays into each nostril in the morning.     melatonin 3 mg Tab  Take 1 tablet (3 mg total) by mouth every evening.     naltrexone 50 mg tablet  Commonly known as: DEPADE  Take 1 tablet (50 mg total) by mouth in the morning.     nicotine 21 mg/24 hr patch  Commonly known as: NICODERM CQ  Place 1 patch on the skin in the morning.     OLANZapine 20 MG tablet  Commonly known as: ZyPREXA  Take 1 tablet (20 mg total) by mouth nightly.     sodium chloride 1 gram tablet  Take 1 tablet (1 g total) by mouth Three (3) times a day with a meal.     THERA-M Tab  Generic drug: multivitamin,tx-iron-minerals  Take 1 tablet by mouth daily.     thiamine mononitrate (vit B1) 100 mg Tab tablet  Take 1 tablet (100 mg total) by mouth in  the morning. Significant Diagnostic Studies: Reviewed in Epic    Consults: None    Procedures: None    Discharge Instructions:     Medications:  Please take all medications as prescribed below and note any changes.    Other Instructions and Information:   Follow Up instructions and Outpatient Referrals     Referral to outpatient occupational therapy      Referral to outpatient physical therapy      Call MD for:  difficulty breathing, headache or visual disturbances      Call MD for:  persistent nausea or vomiting      Call MD for:  severe uncontrolled pain      Call MD for:  temperature >38.5 Celsius      Discharge instructions        Activity Instructions     Activity as tolerated            Follow-Up Appointments:   Please make all follow-up appointments as noted below. If not already scheduled, please set-up an appointment with a Primary Care Provider for continued general medical care.  Other Instructions     Call MD for:  difficulty breathing, headache or visual disturbances      Call MD for:  persistent nausea or vomiting      Call MD for:  severe uncontrolled pain      Call MD for:  temperature >38.5 Celsius      Discharge instructions      You were hospitalized at [facility] after grand mal seizure post alcohol withdrawal. You have made excellent progress during your rehab stay.     MEDICATIONS:  Please take all your medications as prescribed above and note any changes as listed.    DIET:  regular    ACTIVITY:  Recovery takes several months, especially if you are elderly or have another illness. Your body uses a lot of energy recovering and needs time and rest. Gradually increase your activity taking rest periods as needed to ensure that you are being safe.     ADDITIONAL INSTRUCTIONS:  -Encourage alcohol abstinence w/ community support    WHEN TO CALL YOUR PHYSICIAN:  Following discharge from the hospital, please call 911 immediately and go to the nearest Emergency Department if you notice:  - Temperature greater than 101.42F or chills  - Pain not controlled with prescribed medications  - Uncontrolled nausea or vomiting  - Worsening or persistent abdominal pain  - Inability to pass stool for 3 days or more   - Severe or worsening headache or acute changes in vision  - Weakness or loss of sensation in your arms or legs  - Chest pain  - Shortness of breath  - Wound changes including spreading redness, thick yellow drainage, severe bleeding, or separation of your wounds    If you develop these symptoms or if you have trouble obtaining any of your medications, you may also call the [facility] at [phone] as needed.    You may go to the [facility] at [address] in [place] or call the [facility] Link at [phone] for further assistance.    If you develop surgical wound issues such as:  - Spreading redness  - Purulent discharge  - Increasing bleeding or drainage  - Separation of incision or sutures  - Fever >101.42F  - Pain uncontrolled by medications       FOLLOW-UP:  Please see the  list below of follow-up appointments and make note of the additional providers you will need to see:  - Follow-up with your Primary Care Physician in 1-2 weeks to inform him or her of your recent hospital admission and any medication changes. If not already scheduled, please contact to set-up an appointment.    -Psychiatry appt 7/15  -Internal medicine 9/14        Appointments which have been scheduled for you    Oct 02, 2020  1:00 PM  (Arrive by 12:45 PM)  NEW ADULT with Nurum Joeseph Amor, MD  [facility] INTERNAL MEDICINE WE[facility] CR[facility]ING [facility]) [address]  [address]  [place] [address]  [phone]             Discharge Day Services:  The patient was seen and examined on the day of discharge. Vitals signs and exam are stable. Therapy goals met. Discharge medications and instructions were discussed with the patient and family, and all questions were answered.    Time spent for discharge: 30 minutes or greater    Physical Exam:  Vitals:    07/31/20 0800   BP: 135/76   Pulse: 77   Resp: 18   Temp:    SpO2: 97%            GEN: NAD   EYES: sclera anicteric, conjunctiva clear   HENT: NCAT, MMM, OP clear  NECK: trachea midline  RESP: CTAB, NWOB   CV: RRR, no m/r/g, ext no c/c/e, radial and DPP 2+ b/l  GI: abd soft, NTND, NABS   GU: Foley in place with clear-yellow urine  SKIN: no visible masses, lesions, rashes, ecchymoses, or lacerations  MSK: FROM in BUE and BLE, no notable contractures, no visible swelling or erythema over joints, joints NTTP  NEURO: A&Ox3, 5/5 throughout for exception of 4+ R EF/EE. ??????????????  PSYCH: mood euthymic, affect appropriate, thought process logical????????    Discharge Condition: Improved    Discharge Disposition: home with family assistance/supervision    Outpatient Therapy: Outpatient Physical Therapy and Outpatient Occupational Therapy     Sunday Shams, MD  [facility] Physical Medicine and Rehabilitation

## 2020-08-09 DIAGNOSIS — L409 Psoriasis, unspecified: Principal | ICD-10-CM

## 2020-08-09 MED ORDER — HUMIRA SYRINGE CITRATE FREE 40 MG/0.4 ML
1 refills | 0.00000 days
Start: 2020-08-09 — End: ?

## 2020-08-09 NOTE — Unmapped (Signed)
Called, was unable to contact patient.  MyChart message sent.  Thanks

## 2020-08-09 NOTE — Unmapped (Signed)
This refill request was accepted in March by Dr. Odis Luster on the condition that he return to clinic for an appt.  He will need an appt for additional refills.  Please schedule team with any available physician-- in person or video.

## 2020-08-09 NOTE — Unmapped (Signed)
[facility] Refill Coordination Note    Specialty Medication(s) to be Shipped:   Inflammatory Disorders: Humira    Other medication(s) to be shipped: No additional medications requested for fill at this time     Nicholas Rush, DOB: 1965/11/30  Phone: [phone] (home)       All above HIPAA information was verified with patient.     Was a Nurse, learning disability used for this call? No    Completed refill call assessment today to schedule patient's medication shipment from the [facility] [phone]).  All relevant notes have been reviewed.     Specialty medication(s) and dose(s) confirmed: Regimen is correct and unchanged.   Changes to medications: Nicholas Rush reports no changes at this time.  Changes to insurance: No  New side effects reported not previously addressed with a pharmacist or physician: None reported  Questions for the pharmacist: No    Confirmed patient received a Conservation officer, historic buildings and a Surveyor, mining with first shipment. The patient will receive a drug information handout for each medication shipped and additional FDA Medication Guides as required.       DISEASE/MEDICATION-SPECIFIC INFORMATION        For patients on injectable medications: Patient currently has 0 doses left.  Next injection is scheduled for 08/14/2020.    SPECIALTY MEDICATION ADHERENCE     Medication Adherence    Patient reported X missed doses in the last month: 2  Specialty Medication: Humira  Patient is on additional specialty medications: No        Were doses missed due to medication being on hold? No    REFERRAL TO PHARMACIST     Referral to the pharmacist: Not needed      [facility]     Shipping address confirmed in Epic.     Delivery Scheduled: Yes, Expected medication delivery date: 08/14/2020.  However, Rx request for refills was sent to the provider as there are none remaining.     Medication will be delivered via Same Day Courier to the prescription address in Epic WAM.    Oretha Milch [facility] Specialty Technician

## 2020-08-14 NOTE — Unmapped (Signed)
Nicholas Rush 's Humira shipment will be delayed as a result of Refills denied-pt needs to make appt. Pt aware.     I have reached out to the patient  at [phone] and communicated the delay. We will call the patient back to reschedule the delivery upon resolution. We have not confirmed the new delivery date.

## 2020-08-16 ENCOUNTER — Ambulatory Visit
Admit: 2020-08-16 | Discharge: 2020-08-21 | Disposition: A | Discharge status: Home Health | Payer: MEDICAID | Admitting: Neurology

## 2020-08-16 MED ADMIN — divalproex ER (DEPAKOTE ER) extended release 24 hr tablet 750 mg: 750 mg | ORAL | @ 02:00:00 | Stop: 2020-08-15

## 2020-08-16 MED ADMIN — gabapentin (NEURONTIN) capsule 300 mg: 300 mg | ORAL | @ 13:00:00

## 2020-08-16 MED ADMIN — dilTIAZem (CARDIZEM CD) 24 hr capsule 360 mg: 360 mg | ORAL | @ 13:00:00

## 2020-08-16 MED ADMIN — sodium chloride tablet 1 g: 1 g | ORAL | @ 13:00:00

## 2020-08-16 MED ADMIN — sodium chloride tablet 1 g: 1 g | ORAL | @ 21:00:00

## 2020-08-16 MED ADMIN — sodium chloride tablet 1 g: 1 g | ORAL | @ 17:00:00

## 2020-08-16 MED ADMIN — lamoTRIgine (LaMICtal) tablet 25 mg: 25 mg | ORAL | @ 22:00:00

## 2020-08-16 MED ADMIN — enoxaparin (LOVENOX) syringe 40 mg: 40 mg | SUBCUTANEOUS | @ 13:00:00

## 2020-08-16 MED ADMIN — nicotine (NICODERM CQ) 21 mg/24 hr patch 1 patch: 1 | TRANSDERMAL | @ 13:00:00

## 2020-08-16 MED ADMIN — melatonin tablet 3 mg: 3 mg | ORAL | @ 21:00:00

## 2020-08-16 MED ADMIN — gadobenate dimeglumine (MULTIHANCE) 529 mg/mL (0.1mmol/0.2mL) solution 17 mL: 17 mL | INTRAVENOUS | @ 10:00:00 | Stop: 2020-08-16

## 2020-08-16 MED ADMIN — diazePAM (VALIUM) tablet 5 mg: 5 mg | ORAL | @ 09:00:00 | Stop: 2020-08-16

## 2020-08-16 MED ADMIN — acetaminophen (TYLENOL) solution 650 mg: 650 mg | ORAL | @ 23:00:00

## 2020-08-16 NOTE — Unmapped (Signed)
Bed: 07-P  Expected date:   Expected time:   Means of arrival:   Comments:  19B
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
## 2020-08-16 NOTE — Unmapped (Signed)
Received sign out from previous provider.    ?? Patient Summary: Nicholas Rush is a 55 y.o. male with history of hypertension, bipolar disorder, alcohol use disorder complicated with withdrawal seizures (last drink reportedly 6 weeks ago), hyponatremia presents emergency department status post seizure lasted approximately 30 seconds without head injury.  Patient had bowel incontinence and an injury to his tongue.  Reportedly has seizures but his sodium is low.  Patient normally takes three 1g salt tablets a day.  Only took 1 salt tablet today.  On arrival patient was alert and oriented.  Exam revealed anisocoria which is old per the patient.  Neuro exam is grossly normal.  Work-up revealed sodium of 127 and Depakote level of 25.4.  Patient likely needs admission due to his hyponatremia and subtherapeutic Depakote level.  MAO paged for admission.    Updates  ED Course as of 08/16/20 0307   Fri Aug 16, 2020   0103 Spoke with FM attending at [facility] who would like for neurology to clear the patient prior to transfer. Will consult neurology.    [id number] Neurology to admit the patient for further management and workup.  Order MRI brain per neurology request.

## 2020-08-16 NOTE — Unmapped (Signed)
Bed: 19-B  Expected date:   Expected time:   Means of arrival:   Comments:  seizure

## 2020-08-16 NOTE — Unmapped (Signed)
Pt detoxing and had a seizure per wife. Wife states this happens when his sodium is low

## 2020-08-16 NOTE — Unmapped (Signed)
Neurology Inpatient Team B (NMB)  Daily Progress Note       Patient: Nicholas Rush  Code Status: Full Code  Level of Care: Acute floor status.   LOS: 0 days      Overnight Events & Subjective:     - Patient confirms that he has had no alcohol in the last few weeks  - No missed doses of medication  - Has been eating and drinking normally    - Per discussion with psychiatry, will titrate Lamictal while remaining on Depakote until Lamictal therapeutic. Per pharmacy, remaining on Depakote will require a very slow titration of Lamictal     Physical Exam:     General Exam:  General Appearance:Well appearing. In no acute distress.  HEENT: Head is atraumatic and normocephalic. Sclera anicteric without injection. Oropharyngeal membranes are moist with no erythema or exudate.  Neck: Supple.  Lungs: Normal work of breathing. Clear to auscultation in anterior fields. No wheezes or crackles.  Heart: Regular rate and rhythm. No murmurs, rubs, or gallops.  Abdomen: Soft, nontender, nondistended.  Extremities: No clubbing, cyanosis, or edema.    Neurological Exam:  Mental Status: Alert, conversant, able to follow conversation and interview. Spontaneous speech was fluent without word finding pauses, dysarthria, or paraphasic errors. Comprehension was intact. Memory for recent and remote events was intact.  Cranial Nerves: PERRL. Pursuit eye movements were uninterrupted with full range and without more than end-gaze nystagmus. Facial sensation intact bilaterally to light touch in all three divisions of CNV. Face symmetric at rest. Normal facial movement bilaterally, including forehead, eye closure and grimace/smile. Hearing intact to conversation. Shoulder shrug full strength bilaterally. Palate movement is symmetric. Tongue protrudes midline and tongue movements are normal.   Motor Exam: Normal bulk.  Normal tone in the upper and lower extremities.  5/5 throughout.  Mild tremulousness of bilateral upper extremities  Reflexes: DTRs are 2+ and symmetric throughout. Toes are downgoing bilaterally.  Sensory: Sensation normal to light touch in both hands and both feet and to vibration distally in the fingers and toes.    Cerebellar/Coordination/Gait:  did not assess gait today.       Assessment/Plan:       Assessment: Nicholas Rush is a 55 y.o. male with a past medical history of HTN, Paroxysmal Afib, Bipolar I Disorder, Anxiety, Depression, Alcohol Use disorder c/b withdrawal seizures, tobacco use, Psoriatic arthritis on Humara who was admitted to [facility] on 08/15/2020 for seizure acitivity.       #Epilepsy and Alcohol withdrawal seizures:  Pt has long history of alcohol withdrawal seizures and seizures in setting of hyponatremia (Na to 111). Presented to ED after 5 minute seizure with 20 minutes of post-ictal confusion prior to returning to baseline on 7/28. Unlikely that seizure is secondary to alcohol withdrawal given sobriety for 6 weeks with ethanol level wnl. Hyponatremia could potentially be lowering seizure threshold but level is within typical range on presentation. MRI and routine EEG in the past have been without seizure focus. Pt does not have obvious provoking factor at this time and history is concerning for potential epilepsy. He has been on Depakote, as below, for bipolar disorder but not at a therapeutic level for seizure control. At this point, treatment with an AED to therapeutic levels is warranted. Per discussion with both psychiatry and pharmacy, will do slow titration of Lamictal as below.      - Continue Depakote 750 nightly until Lamictal at therapeutic level  - Given interaction  of Depakote and Lamictal, initiate Lamictal at 25 mg every other day for 2 weeks. After 2 weeks, may increase based on response and tolerability (weeks 3 and 4: 25 mg daily; week 5 and beyond: increase daily dose by 25 to 50 mg every 1 to 2 weeks.  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 Risk/benefit of SIADH (relatively well controlled with fluid restriction and salt tabs) and Depakote treatment (has been the most effective for bipolar as below) was discussed with patient and wife previously and decision was made to continue Depakote for mood symptoms. Sodium is 127 (within normal baseline for patient) on presentation. Despite thought that SIADH is most likely due to Depakote use, patient has had come positive cancer screenings that need followed up in the outpatient setting, as below.  - BID BMP  - Continue 1g NaCl tabs TID  - 1.5 L fluid restriction  - Outpatient follow-up for prior history of positive cologard but no colonoscopy; prostate with calcifications on CT scan (PSA pending); and CT chest with RUL nodules (recommended yearly scans)     #Paroxsymal Afib:   - Continue Diltiazem 360mg  q24hr  - Monitor on telemetry  - Discuss anticoagulation in outpatient setting     #Bipolar Disorder: Follows with [facility]. Improvement in Bipolar symptoms while on Depakote. Was briefly switched to Lithium in the past but could not tolerate due to side effects. Per chart review, control of mood symptoms is high priority to patient and family. Now that he warrants treatment for suspected epilepsy, it will be important to determine if mood disorder and seizures can be treated with the same medication or if he requires 2 medications to treat the 2 conditions. Unfortunately due to SIADH as above, titration of Depakote is not possible.   - Psych following; appreciate assistance  - Appreciate pharmacy assistance regarding Lamictal titration plan  - Continue home Depakote as above  - Slow Lamictal titration as above  - Continue Zyprexa 20mg  nightly   - Zyprexa 2.5 BID PRN for anxiety and insomnia     #Alcohol Use Disorder: Pt reports chronic alcohol use history. Has been sober over past 6 weeks so lower concern for withdrawal symptoms. Patient has not seized in the ED but did exhibit some bilateral UE tremulousness on the morning of 7/29. Monitored on CIWA.  - Continue Gabapentin 300mg  BID (to help with withdrawal symptoms, chronic pain and anxiety)  - CIWA scoring (no Ativan ordered)     #Tobacco Use Disorder:  - Nicotine patch     # Discharge Planning:   - Case management: pending.  - Social work: pending.  - PT: consulted, TBD  - OT: consulted, TBD  - SLP: N/A  - PM&R: N/A  - Expected Discharge Disposition: Home.    - Will need Neurology follow-up    # Checklist:  - Diet: Regular diet and 1.5L fluid restriction  - IV fluids: no  - Bowel Regimen: No indication for a bowel regimen at this time (reason: having BMs)  - GI PPX: No GI indications  - DVT PPX: Lovenox 40 mg [place] daily  - Lines/Access: PIV x2.    - Foley: No    This patient was seen and discussed with Dr. Beverly Milch??var-Su??rez, who agrees with the above assessment and plan.  Please page the Neurology Team B Resident at [phone] for any questions/concerns.    Alwyn Pea, MD  PGY-2 Resident  [facility] Department of Neurology      I have personally seen the patient with the resident physician responsible for the patient encounter. We have discussed the history, physical exam and formulated an assessment plan together.    Xaviera Flaten L. Andreas Newport, MD  Clinical Associate Professor of Neurology  [facility]         Data Review:       Contact Information:  Family contact: Wife  PCP: Fabio Pierce, MD    Medications:  Scheduled medications:    dilTIAZem  360 mg Oral Q24H    divalproex ER  1,000 mg Oral BID    enoxaparin (LOVENOX) injection  40 mg Subcutaneous Q24H    gabapentin  300 mg Oral BID    melatonin  3 mg Oral QPM    nicotine  1 patch Transdermal Daily    OLANZapine  20 mg Oral Nightly    polyethylene glycol  17 g Oral Daily    sodium chloride  1 g Oral 3xd Meals     Continuous infusions:   PRN medications: senna    24 hour vital signs:  Temp:  [36.8 ??C (98.2 ??F)] 36.8 ??C (98.2 ??F)  Heart Rate:  [61-66] 62  SpO2 Pulse:  [61-62] 62  Resp:  [13-17] 13  BP: (109-138)/(66-87) 126/66  MAP (mmHg):  [80-102] 84  SpO2:  [93 %-100 %] 96 %    Ins and Outs:  I/O this shift:  In: 480 [P.O.:480]  Out: -     Laboratory values:  All Labs Last 24hrs:   Recent Results (from the past 24 hour(s))   Urinalysis with Culture Reflex    Collection Time: 08/16/20  1:26 AM    Specimen: Clean Catch; Urine   Result Value Ref Range    Color, UA Colorless     Clarity, UA Clear     Specific Gravity, UA 1.008 1.003 - 1.030    pH, UA 6.5 5.0 - 9.0    Leukocyte Esterase, UA Negative Negative    Nitrite, UA Negative Negative    Protein, UA Negative Negative    Glucose, UA Negative Negative    Ketones, UA 10 mg/dL (A) Negative    Urobilinogen, UA <2.0 mg/dL <4.5 mg/dL    Bilirubin, UA Negative Negative    Blood, UA Negative Negative    RBC, UA <1 <=3 /HPF    WBC, UA 1 <=2 /HPF    Squam Epithel, UA <1 0 - 5 /HPF    Bacteria, UA None Seen None Seen /HPF    Mucus, UA Rare (A) None Seen /HPF   COVID-19 PCR    Collection Time: 08/16/20  1:57 AM    Specimen: Nasopharyngeal Swab   Result Value Ref Range    SARS-CoV-2 PCR Negative Negative   Phosphorus Level    Collection Time: 08/16/20  6:52 AM   Result Value Ref Range    Phosphorus 3.6 2.4 - 5.1 mg/dL   CBC w/ Differential    Collection Time: 08/16/20  6:52 AM   Result Value Ref Range    WBC 7.1 3.6 - 11.2 10*9/L    RBC 3.34 (L) 4.26 - 5.60 10*12/L    HGB 12.7 (L) 12.9 - 16.5 g/dL    HCT 40.9 (L) 81.1 - 48.0 %    MCV 103.8 (H) 77.6 - 95.7 fL  MCH 38.0 (H) 25.9 - 32.4 pg    MCHC 36.6 (H) 32.0 - 36.0 g/dL    RDW 29.5 62.1 - 30.8 %    MPV 7.5 6.8 - 10.7 fL    Platelet 268 150 - 450 10*9/L    Neutrophils % 60.1 %    Lymphocytes % 23.3 %    Monocytes % 10.8 %    Eosinophils % 4.6 %    Basophils % 1.2 %    Absolute Neutrophils 4.3 1.8 - 7.8 10*9/L    Absolute Lymphocytes 1.7 1.1 - 3.6 10*9/L    Absolute Monocytes 0.8 0.3 - 0.8 10*9/L    Absolute Eosinophils 0.3 0.0 - 0.5 10*9/L    Absolute Basophils 0.1 0.0 - 0.1 10*9/L    Macrocytosis Slight (A) Not Present   Morphology Review    Collection Time: 08/16/20  6:52 AM   Result Value Ref Range    Smear Review Comments See Comment (A) Undefined    Toxic Vacuolation Present (A) Not Present   Basic Metabolic Panel    Collection Time: 08/16/20  8:15 AM   Result Value Ref Range    Sodium 125 (L) 135 - 145 mmol/L    Potassium 4.0 3.4 - 4.8 mmol/L    Chloride 96 (L) 98 - 107 mmol/L    CO2 24.0 20.0 - 31.0 mmol/L    Anion Gap 5 5 - 14 mmol/L    BUN <5 (L) 9 - 23 mg/dL    Creatinine 6.57 8.46 - 1.10 mg/dL    eGFR CKD-EPI (9629) Male >90 >=60 mL/min/1.63m2    Glucose 82 70 - 179 mg/dL    Calcium 9.1 8.7 - 52.8 mg/dL   Magnesium Level    Collection Time: 08/16/20  8:15 AM   Result Value Ref Range    Magnesium 1.6 1.6 - 2.6 mg/dL   Valproic Acid Level    Collection Time: 08/16/20  8:15 AM   Result Value Ref Range    Valproic Acid, Total 31.8 (L) 50.0 - 100.0 ug/mL   PSA (Prostate Specific Antigen)    Collection Time: 08/16/20  1:24 PM   Result Value Ref Range    PSA 0.19 0.00 - 4.00 ng/mL       Imaging:  Pertinent imaging discussed in the A/P section

## 2020-08-16 NOTE — Unmapped (Signed)
[facility]  Emergency Department Provider Note    Initial Impression, Exam, Assessment and Plan, MDM     Impression: 55 y.o. male with h/o hypertension, bipolar 1 disorder, alcohol use disorder complicated by withdrawal seizures (last drink reportedly 6 weeks ago), hyponatremia who presents to the emergency department today for evaluation of a grand mal seizure that occurred while sitting on the couch.  Lasted approximately 30 seconds without head injury.  Associated symptoms include bowel incontinence and tongue injury.  Patient reports he has had seizures in the past when his sodium is too low, and he normally takes 3 salt tablets daily (has only taken 1 of these today).  He has also not yet had his evening dose of Depakote. EMS reports on scene blood glucose of approximately 130 and patient was postictal.      Upon arrival to the emergency department his exam is as follows: Afebrile.  A&Ox3. L pupil 4mm reactive, R pupil 2mm reactive (anisocoria is old per patient). EOMI. No nystagmus. Visual fields full. CN II-XII intact. Speech is fluent and clear. No facial droop. 5/5 strength and sensation in all extremities. Normal muscle tone. No pronator drift. Normal heel to shin. Moves all extremities spontaneously. Gait testing deferred.  Patient has evidence of tongue injury to the left side of his tongue.  Patient is tremulous of outstretched hands and lower extremities bilaterally. No neck stiffness.       BP 138/87  - Pulse 66  - Temp 36.8 ??C (98.2 ??F)  - Resp 17  - SpO2 97%      Assessment/plan/MDM:   55 y.o. male with history as above presenting for evaluation of witnessed generalized tonic-clonic seizure with tongue injury and bladder incontinence.  Review of records show the patient was admitted to Dr [facility] 7/2 through 7/13 to [facility].  His hospital course was complicated by alcohol withdrawal (autonomic instability, tremor, hallucinations, confusion, agitation), aspiration pneumonia (completed 5 day course of abx), and paroxysmal atrial fibrillation (diltiazem).  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    Differential at this time includes seizure secondary to infection vs medication nonadherence vs alcohol withdrawal vs hyponatremia among multiple other potential etiologies.  Less likely meningitis given A/Ox3, afebrile and no neck stiffness. Do not feel patient warrants head CT at this time due to anisocoria is old and his neurologic exam is otherwise reassuring.  We will check basic labs, electrolytes, EKG and UA/UDS.    Diagnostic orders:    Orders Placed This Encounter   Procedures   ??? CBC w/ Differential   ??? Comprehensive metabolic panel   ??? Valproic Acid Level   ??? Urinalysis with Culture Reflex   ??? Toxicology Screen, Urine   ??? Ethanol,Blood   ??? POCT Glucose - RN Obtain   ??? Clinical institute withdrawal assessment (CIWA)   ??? ECG 12 Lead       Progress     ED Course as of 08/15/20 2252   Thu Aug 15, 2020   2251 WBC: 7.4   2251 HGB: 13.5   2251 Valproic Acid, Total(!): 25.4   2251 Sodium(!): 127   2251 Bun(!): 6   2251 Creatinine: 0.78   2251 Noted valproic acid level low.  Will order dose here. Patient also hyponatremic at 127.  Given hyponatremic in setting of subtherapeutic  AED meds, feel patient warrants admission for correction of hyponatremia. Utox pending at this time. Paged MAO.              Disposition: Admit  _____________________________________________________    The case was discussed with the attending physician, Nicholas Roof, MD , who is in agreement with the above assessment and plan.    ED Clinical Impression     Final diagnoses:   Hyponatremia (Primary)   Seizure (CMS-HCC)       Additional Medical Decision Making     Time seen: August 15, 2020 8:50 PM    I have reviewed the vital signs and the nursing notes. Labs and radiology results that were available during my care of the patient were independently reviewed by me and considered in my medical decision making.     I independently visualized the EKG tracing if performed  I independently visualized the radiology images if performed  I reviewed the patient's prior medical records if available.  Additional history obtained from family if available    Please note- This chart has been created using [employer]. Chart creation errors have been sought, but may not always be located and such creation errors, especially pronoun confusion, do NOT reflect on the standard of medical care.      History     Chief Complaint  Seizure - New Onset      History provided by patient.     HPI   Nicholas Rush is a 55 y.o. male with h/o hypertension, bipolar 1 disorder, alcohol use disorder complicated withdrawal seizures (last drink reportedly 6 weeks ago), hyponatremia who presents to the emergency department today for evaluation of a grand mal seizure that occurred while sitting on the couch.  Lasted approximately 30 seconds without head injury.  Associated symptoms include left-sided tongue injury and bladder incontinence. Pt last recalls sitting on the couch prior to EMS arrival. On scene blood glucose was reportedly in 130s and pt appeared post-ictal per EMS. Pt now is back to mental baseline here (A/Ox3). He denies any recent fevers, chills, chest pain, SOB, abdominal pain, nausea, vomiting, rashes.     Of note, he reports a history of loose stools recently but no hematochezia/melena. Also states he has taken only one of his daily sodium tablets today (supposed to take 3g daily). He also has not taken his evening dose of depakote.       ROS - 10 point review of systems was performed and is negative unless otherwise noted in HPI    CONST: Does not endorse fever/chills or changes in appetite.  NEURO: + Seizure  NECK: Does not endorse neck stiffness or neck pain.   ENT: + tongue injury  CV: Does not endorse chest pain or palpitations.   RESP: Does not endorse cough or shortness of breath.  GI: Does not endorse nausea, vomiting, diarrhea or abdominal pain.  HEME: Does not endorse easy bruising or bleeding.   GU: + bladder incontinence  SKIN: Does not endorse rashes or lesions.   MSK: Does not endorse myalgias or arthralgias.      PAST MEDICAL HISTORY/PAST SURGICAL HISTORY:   Past Medical History:   Diagnosis Date   ??? Alcohol withdrawal syndrome, with delirium (CMS-HCC) 05/11/2012   ??? Anxiety    ??? Bipolar 1 disorder (CMS-HCC)    ??? Depressive disorder    ??? Erectile dysfunction    ??? Esophageal reflux    ??? History of pyloric stenosis    ???  Hypertension    ??? Insomnia    ??? Psoriasis     Dermatologist - Dr. Deloria Lair in [facility]   ??? Seizure (CMS-HCC) 02/20/2019   ??? Tobacco use     1ppd       Patient Active Problem List   Diagnosis   ??? Bipolar disorder (CMS-HCC)   ??? Alcohol withdrawal (CMS-HCC)   ??? Alcohol use disorder, moderate, dependence (CMS-HCC)   ??? Anxiety state   ??? Essential hypertension   ??? Psoriasis   ??? Psoriatic arthritis (CMS-HCC)   ??? Positive colorectal cancer screening using DNA-based stool test   ??? Recurrent falls   ??? Thrombocytopenia (CMS-HCC)   ??? Macrocytic anemia   ??? Hyponatremia   ??? Tobacco use disorder   ??? Encephalopathy   ??? Chronic midline low back pain   ??? Paroxysmal atrial fibrillation (CMS-HCC)   ??? Recurrent Clostridioides difficile infection       Past Surgical History:   Procedure Laterality Date   ??? PYLOROMYOTOMY     ??? WISDOM TOOTH EXTRACTION         MEDICATIONS:   No current facility-administered medications for this encounter.    Current Outpatient Medications:   ???  acetaminophen (TYLENOL) 500 MG tablet, Take 500 mg by mouth every six (6) hours as needed for pain., Disp: , Rfl:   ???  ADALIMUMAB SYRINGE CITRATE FREE 40 MG/0.4 ML, Inject the contents of 1 syringe (40 mg) under the skin every 14 days, Disp: 4 each, Rfl: 1  ???  calcium-vitamin D (CALCIUM-VITAMIN D) 500 mg-5 mcg (200 unit) per tablet, Take 1 tablet by mouth Three (3) times a day with a meal., Disp: 30 tablet, Rfl: 0  ???  cetirizine (ZYRTEC) 10 MG tablet, Take 1 tablet (10 mg total) by mouth in the morning., Disp: 30 tablet, Rfl: 0  ???  diltiazem (TIAZAC) 360 MG 24 hr capsule, Take 1 capsule (360 mg total) by mouth daily. NOTE: This replaces amlodipine., Disp: 30 capsule, Rfl: 0  ???  divalproex ER (DEPAKOTE ER) 250 MG extended release 24 hr tablet, Take 3 tablets (750 mg total) by mouth nightly., Disp: 90 tablet, Rfl: 0  ???  docusate sodium (COLACE) 100 MG capsule, Take 1 capsule (100 mg total) by mouth in the morning., Disp: 30 capsule, Rfl: 0  ???  fluticasone propionate (FLONASE) 50 mcg/actuation nasal spray, Use 2 sprays in each nostril in the morning., Disp: 16 g, Rfl: 12  ???  folic acid (FOLVITE) 1 MG tablet, Take 1 tablet (1 mg total) by mouth in the morning., Disp: 30 tablet, Rfl: 11  ???  gabapentin (NEURONTIN) 300 MG capsule, Take 1 capsule (300 mg total) by mouth Two (2) times a day., Disp: 60 capsule, Rfl: 0  ???  loperamide (IMODIUM) 2 mg capsule, Take 2 capsules (4 mg total) by mouth every four (4) hours as needed. Do not exceed 8 capsules (16mg ) per day, Disp: 30 capsule, Rfl: 0  ???  magnesium oxide (MAG-OX) 400 mg (241.3 mg elemental magnesium) tablet, Take 2 tablets (800 mg total) by mouth Two (2) times a day., Disp: 120 tablet, Rfl: 11  ???  melatonin 3 mg Tab, Take 1 tablet (3 mg total) by mouth every evening., Disp: 30 tablet, Rfl: 0  ???  multivitamin,tx-iron-minerals (THERA-M) Tab, Take 1 tablet by mouth daily., Disp: 30 tablet, Rfl: 0  ???  naltrexone (DEPADE) 50 mg tablet, Take 1 tablet (50 mg total) by mouth in the morning., Disp: 30 tablet, Rfl: 0  ???  nicotine (NICODERM CQ) 21 mg/24 hr patch, Place 1 patch on the skin in the morning. Remove old patch before applying new one ., Disp: 28 patch, Rfl: 5  ???  OLANZapine (ZYPREXA) 20 MG tablet, Take 1 tablet (20 mg total) by mouth nightly., Disp: 30 tablet, Rfl: 0  ???  polyethylene glycol (MIRALAX) 17 gram packet, Mix contents of 1 packet (17 grams) in 4 to 8 ounces of liquid and drink daily., Disp: 30 packet, Rfl: 0  ???  senna (SENNA) 8.6 mg tablet, Take 2 tablets by mouth nightly as needed for constipation., Disp: 30 tablet, Rfl: 0  ???  simethicone (MYLICON) 80 MG chewable tablet, Chew 1 tablet (80 mg total) Three (3) times a day as needed., Disp: 30 tablet, Rfl: 0  ???  sodium chloride 1 gram tablet, Take 1 tablet (1 g total) by mouth Three (3) times a day with a meal., Disp: 100 tablet, Rfl: 2  ???  thiamine mononitrate, vit B1, 100 mg Tab tablet, Take 1 tablet (100 mg total) by mouth in the morning., Disp: 90 tablet, Rfl: 0    ALLERGIES:   Ativan [lorazepam], Tramadol, and Hydroxyzine    SOCIAL HISTORY:   Social History     Tobacco Use   ??? Smoking status: Current Every Day Smoker     Packs/day: 1.50     Years: 30.00     Pack years: 45.00     Types: Cigarettes   ??? Smokeless tobacco: Never Used   Substance Use Topics   ??? Alcohol use: Yes     Comment: 3 drinks a day.       FAMILY HISTORY:  Family History   Problem Relation Age of Onset   ??? Bipolar disorder Mother    ??? Depression Sister    ??? Alcohol abuse Sister    ??? Alcohol abuse Maternal Aunt    ??? Melanoma Neg Hx    ??? Basal cell carcinoma Neg Hx    ??? Squamous cell carcinoma Neg Hx           Physical Exam     ED Triage Vitals   Enc Vitals Group      BP 08/15/20 2042 138/87      Heart Rate 08/15/20 2052 66      SpO2 Pulse 08/16/20 0300 61      Resp 08/15/20 2052 17      Temp 08/15/20 2052 36.8 ??C (98.2 ??F)      Temp src --       SpO2 08/15/20 2040 96 %     EXAM     CONST: Well appearing, well nourished, NAD.  HEENT: [place]/AT, conjunctivae clear, moist mucous membranes. Tongue injury to the left side of tongue.   NECK: Trachea midline.  Neck supple. No meningismus  CHEST WALL: Non-tender to palpation.  CV: RRR, no m/g/r. 2+ radial pulses bilaterally. No pedal edema.  RESP: Normal WOB. Lungs CTAB without wheezes or crackles.   ABD: Abdomen soft, non-tender, non-distended. No rebound or guarding. No masses.  BACK: Atraumatic. No midline tenderness. No CVA tenderness.   MSK: No gross joint deformities.  SKIN: Warm and well perfused. No rashes or lesions.   HEME: No petechiae or bruising.  PSYCH: Mood and affect are normal.  NEURO:  A&Ox3. L pupil 4mm reactive, R pupil 2mm reactive (anisocoria is old per patient). EOMI. No nystagmus. Visual fields full. CN II-XII intact. Speech is fluent and clear. No facial droop. 5/5 strength and sensation in all  extremities. Normal muscle tone. No pronator drift. Normal heel to shin. Moves all extremities spontaneously. Gait testing deferred. Patient is tremulous of outstretched hands and lower extremities bilaterally.         Radiology     No orders to display     ECG 12 Lead    Result Date: 08/15/2020  SINUS BRADYCARDIA OTHERWISE NORMAL ECG WHEN COMPARED WITH ECG OF 16-Jul-2020 20:39, SINUS RHYTHM HAS REPLACED ATRIAL FIBRILLATION VENT. RATE HAS DECREASED BY  75 BPM QUESTIONABLE CHANGE IN QRS AXIS       LABORATORY DATA:     Results for orders placed or performed during the hospital encounter of 08/15/20   Comprehensive metabolic panel   Result Value Ref Range    Sodium 127 (L) 135 - 145 mmol/L    Potassium 4.1 3.4 - 4.8 mmol/L    Chloride 97 (L) 98 - 107 mmol/L    CO2 25.0 20.0 - 31.0 mmol/L    Anion Gap 5 5 - 14 mmol/L    BUN 6 (L) 9 - 23 mg/dL    Creatinine 0.98 1.19 - 1.10 mg/dL    BUN/Creatinine Ratio 8     eGFR CKD-EPI (2021) Male >90 >=60 mL/min/1.63m2    Glucose 84 70 - 179 mg/dL    Calcium 9.4 8.7 - 14.7 mg/dL    Albumin 3.8 3.4 - 5.0 g/dL    Total Protein 7.7 5.7 - 8.2 g/dL    Total Bilirubin 0.4 0.3 - 1.2 mg/dL    AST 17 <=82 U/L    ALT 8 (L) 10 - 49 U/L    Alkaline Phosphatase 65 46 - 116 U/L   Valproic Acid Level   Result Value Ref Range    Valproic Acid, Total 25.4 (L) 50.0 - 100.0 ug/mL   Ethanol,Blood   Result Value Ref Range    Alcohol, Ethyl <10.0 <=10.0 mg/dL   ECG 12 Lead   Result Value Ref Range    EKG Systolic BP  mmHg    EKG Diastolic BP  mmHg    EKG Ventricular Rate 59 BPM    EKG Atrial Rate 59 BPM    EKG P-R Interval 130 ms    EKG QRS Duration 84 ms    EKG Q-T Interval 422 ms    EKG QTC Calculation 417 ms    EKG Calculated P Axis 17 degrees    EKG Calculated R Axis 0 degrees    EKG Calculated T Axis 3 degrees    QTC Fredericia 419 ms   CBC w/ Differential   Result Value Ref Range    WBC 7.4 3.6 - 11.2 10*9/L    RBC 3.65 (L) 4.26 - 5.60 10*12/L    HGB 13.5 12.9 - 16.5 g/dL    HCT 95.6 (L) 21.3 - 48.0 %    MCV 102.7 (H) 77.6 - 95.7 fL    MCH 37.0 (H) 25.9 - 32.4 pg    MCHC 36.1 (H) 32.0 - 36.0 g/dL    RDW 08.6 57.8 - 46.9 %    MPV 7.8 6.8 - 10.7 fL    Platelet 283 150 - 450 10*9/L    Neutrophils % 52.4 %    Lymphocytes % 31.4 %    Monocytes % 8.5 %    Eosinophils % 6.6 %    Basophils % 1.1 %    Absolute Neutrophils 3.9 1.8 - 7.8 10*9/L    Absolute Lymphocytes 2.3 1.1 - 3.6 10*9/L    Absolute Monocytes 0.6 0.3 -  0.8 10*9/L    Absolute Eosinophils 0.5 0.0 - 0.5 10*9/L    Absolute Basophils 0.1 0.0 - 0.1 10*9/L    Macrocytosis Slight (A) Not Present   Morphology Review   Result Value Ref Range    Smear Review Comments See Comment (A) Undefined    Toxic Vacuolation Present (A) Not Present        Nicholas Folks Temika Sutphin, MD  Resident  08/16/20 1500

## 2020-08-17 MED ADMIN — magnesium oxide (MAG-OX) tablet 400 mg: 400 mg | ORAL | @ 13:00:00 | Stop: 2020-08-17

## 2020-08-17 MED ADMIN — gabapentin (NEURONTIN) capsule 300 mg: 300 mg | ORAL | @ 13:00:00

## 2020-08-17 MED ADMIN — potassium chloride (KLOR-CON) CR tablet 40 mEq: 40 meq | ORAL | @ 13:00:00 | Stop: 2020-08-17

## 2020-08-17 MED ADMIN — folic acid (FOLVITE) tablet 1 mg: 1 mg | ORAL | @ 13:00:00

## 2020-08-17 MED ADMIN — sodium chloride tablet 1 g: 1 g | ORAL | @ 17:00:00 | Stop: 2020-08-17

## 2020-08-17 MED ADMIN — divalproex ER (DEPAKOTE ER) extended release 24 hr tablet 750 mg: 750 mg | ORAL | @ 01:00:00

## 2020-08-17 MED ADMIN — gabapentin (NEURONTIN) capsule 300 mg: 300 mg | ORAL | @ 01:00:00

## 2020-08-17 MED ADMIN — nicotine (NICODERM CQ) 21 mg/24 hr patch 1 patch: 1 | TRANSDERMAL | @ 13:00:00

## 2020-08-17 MED ADMIN — dilTIAZem (CARDIZEM CD) 24 hr capsule 360 mg: 360 mg | ORAL | @ 13:00:00

## 2020-08-17 MED ADMIN — sodium chloride tablet 1 g: 1 g | ORAL | @ 13:00:00 | Stop: 2020-08-17

## 2020-08-17 MED ADMIN — sodium chloride tablet 1 g: 1 g | ORAL | @ 21:00:00 | Stop: 2020-08-17

## 2020-08-17 MED ADMIN — enoxaparin (LOVENOX) syringe 40 mg: 40 mg | SUBCUTANEOUS | @ 13:00:00

## 2020-08-17 MED ADMIN — OLANZapine (ZyPREXA) tablet 20 mg: 20 mg | ORAL | @ 01:00:00

## 2020-08-17 MED ADMIN — thiamine mononitrate (vit B1) tablet 100 mg: 100 mg | ORAL | @ 13:00:00
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
## 2020-08-17 NOTE — Unmapped (Signed)
VSS and no neuro changes noted. PO intake encouraged and fluid restriction maintained. Pt education provided about diet modifications to prevent hyponatremia. Meds taken whole. Pt using urinal, sitting edge of bed for output needs. No BM this shift. PT/OT sessions today; please see their notes for details. Falls and delirium precautions maintained. No outstanding nursing needs at this time. Will continue to monitor.     Problem: Adult Inpatient Plan of Care  Goal: Plan of Care Review  Outcome: Progressing  Goal: Patient-Specific Goal (Individualized)  Outcome: Progressing  Goal: Absence of Hospital-Acquired Illness or Injury  Outcome: Progressing  Intervention: Identify and Manage Fall Risk  Recent Flowsheet Documentation  Taken 08/17/2020 0800 by Avelino Leeds, RN  Safety Interventions:  ??? bed alarm  ??? fall reduction program maintained  ??? lighting adjusted for tasks/safety  ??? low bed  ??? nonskid shoes/slippers when out of bed  ??? room near unit station  Intervention: Prevent and Manage VTE (Venous Thromboembolism) Risk  Recent Flowsheet Documentation  Taken 08/17/2020 0900 by Avelino Leeds, RN  Activity Management: (with PT) ambulated outside room  Goal: Optimal Comfort and Wellbeing  Outcome: Progressing  Goal: Readiness for Transition of Care  Outcome: Progressing  Goal: Rounds/Family Conference  Outcome: Progressing     Problem: Seizure Disorder Comorbidity  Goal: Maintenance of Seizure Control  Outcome: Progressing     Problem: Fall Injury Risk  Goal: Absence of Fall and Fall-Related Injury  Outcome: Progressing  Intervention: Promote Injury-Free Environment  Recent Flowsheet Documentation  Taken 08/17/2020 0800 by Avelino Leeds, RN  Safety Interventions:  ??? bed alarm  ??? fall reduction program maintained  ??? lighting adjusted for tasks/safety  ??? low bed  ??? nonskid shoes/slippers when out of bed  ??? room near unit station     Problem: Self-Care Deficit  Goal: Improved Ability to Complete Activities of Daily Living  Outcome: Progressing

## 2020-08-17 NOTE — Unmapped (Signed)
No falls or injuries noted. Bed in low and locked position. Pt is able to reposition self in the bed. No complaint of pain. No seizure activity noted. Vitals stable throughout shift. Will continue to monitor.     Problem: Adult Inpatient Plan of Care  Goal: Plan of Care Review  Outcome: Progressing  Goal: Patient-Specific Goal (Individualized)  Outcome: Progressing  Goal: Absence of Hospital-Acquired Illness or Injury  Outcome: Progressing  Intervention: Identify and Manage Fall Risk  Recent Flowsheet Documentation  Taken 08/16/2020 2000 by Renee Rival, RN  Safety Interventions:   bed alarm   fall reduction program maintained   low bed   nonskid shoes/slippers when out of bed   room near unit station   seizure precautions  Goal: Optimal Comfort and Wellbeing  Outcome: Progressing  Goal: Readiness for Transition of Care  Outcome: Progressing  Goal: Rounds/Family Conference  Outcome: Progressing     Problem: Seizure Disorder Comorbidity  Goal: Maintenance of Seizure Control  Outcome: Progressing     Problem: Fall Injury Risk  Goal: Absence of Fall and Fall-Related Injury  Outcome: Progressing  Intervention: Promote Injury-Free Environment  Recent Flowsheet Documentation  Taken 08/16/2020 2000 by Renee Rival, RN  Safety Interventions:   bed alarm   fall reduction program maintained   low bed   nonskid shoes/slippers when out of bed   room near unit station   seizure precautions     Problem: Self-Care Deficit  Goal: Improved Ability to Complete Activities of Daily Living  Outcome: Progressing
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
## 2020-08-17 NOTE — Unmapped (Signed)
Neurology Inpatient Team B (NMB)  Daily Progress Note       Patient: Nicholas Rush  Code Status: Full Code  Level of Care: Acute floor status.   LOS: 1 day      Overnight Events & Subjective:     - Working on slow Lamictal titration; first dose last night  - CIWA score has been 0-1 over last 24 hours  - Routine EEG with mild background slowing  - Na 130 this morning  - PT/OT 5x low; patient willing to stay until Monday for rehab planning with CM     Physical Exam:     General Exam:  General Appearance: Well appearing. In no acute distress.  HEENT: Head is atraumatic and normocephalic. Sclera anicteric without injection. Oropharyngeal membranes are moist with no erythema or exudate.  Neck: Supple.  Lungs: Normal work of breathing. Clear to auscultation in anterior fields. No wheezes or crackles.  Heart: Regular rate and rhythm. No murmurs, rubs, or gallops.  Abdomen: Soft, nontender, nondistended.  Extremities: No clubbing, cyanosis, or edema.    Neurological Exam:  Mental Status: Alert, conversant, able to follow conversation and interview. Spontaneous speech was fluent without word finding pauses, dysarthria, or paraphasic errors. Comprehension was intact. Memory for recent and remote events was intact.  Cranial Nerves: Pursuit eye movements were uninterrupted with full range and without more than end-gaze nystagmus. Facial sensation intact bilaterally to light touch in all three divisions of CNV. Face symmetric at rest. Normal facial movement bilaterally, including forehead, eye closure and grimace/smile. Hearing intact to conversation. Shoulder shrug full strength bilaterally.   Motor Exam: Normal bulk.  Normal tone in the upper and lower extremities.  5/5 throughout uppers; 4+/5 in lowers  Reflexes: DTRs are 2+ and symmetric throughout. Toes are downgoing bilaterally.  Sensory: Sensation normal to light touch in both hands and both feet and to vibration distally in the fingers and toes. Cerebellar/Coordination/Gait:  did not assess gait today.       Assessment/Plan:     Assessment: Nicholas Rush is a 55 y.o. male with a past medical history of HTN, Paroxysmal Afib, Bipolar I Disorder, Anxiety, Depression, Alcohol Use disorder c/b withdrawal seizures, tobacco use, Psoriatic arthritis on Humara who was admitted to [facility] on 08/15/2020 for seizure acitivity.     #Epilepsy and alcohol withdrawal seizures: Pt has long history of alcohol withdrawal seizures and seizures in setting of hyponatremia (Na to 111). Presented to ED after 5 minute seizure with 20 minutes of post-ictal confusion prior to returning to baseline on 7/28. Unlikely that seizure is secondary to alcohol withdrawal given sobriety for 6 weeks with ethanol level wnl. Hyponatremia could potentially be lowering seizure threshold but level is within typical range on presentation. MRI and routine EEG in the past have been without seizure focus. Pt does not have obvious provoking factor at this time and history is concerning for potential epilepsy. He has been on Depakote, as below, for bipolar disorder but not at a therapeutic level for seizure control. At this point, treatment with an AED to therapeutic levels is warranted. Per discussion with both psychiatry and pharmacy, will do slow titration of Lamictal as below.      - Continue Depakote 750 nightly until Lamictal at therapeutic level  - Given interaction of Depakote and Lamictal, initiate Lamictal at 25 mg every other day for 2 weeks. After 2 weeks, may increase based on response and tolerability (weeks 3 and 4: 25 mg daily;  week 5 and beyond: increase daily dose by 25 to 50 mg every 1 to 2 weeks.  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 Risk/benefit of SIADH (relatively well controlled with fluid restriction and salt tabs) and Depakote treatment (has been the most effective for bipolar as below) was discussed with patient and wife previously and decision was made to continue Depakote for mood symptoms. Sodium is 127 (within normal baseline for patient) on presentation. Despite thought that SIADH is most likely due to Depakote use, patient has had come positive cancer screenings that need followed up in the outpatient setting, as below.  - BID BMP  - Continue 1g NaCl tabs TID  - 1.5 L fluid restriction  - Outpatient follow-up for prior history of positive cologard but no colonoscopy; prostate with calcifications on CT scan (PSA WNL 7/29); and CT chest with RUL nodules (recommended yearly scans)     #Paroxsymal Afib:   - Continue Diltiazem 360mg  q24hr  - Monitor on telemetry  - Discuss anticoagulation in outpatient setting     #Bipolar Disorder: Follows with [facility] Psychiatry. Improvement in Bipolar symptoms while on Depakote. Was briefly switched to Lithium in the past but could not tolerate due to side effects. Per chart review, control of mood symptoms is high priority to patient and family. Now that he warrants treatment for suspected epilepsy, it will be important to determine if mood disorder and seizures can be treated with the same medication or if he requires 2 medications to treat the 2 conditions. Unfortunately due to SIADH as above, titration of Depakote is not possible.   - Psych following; appreciate assistance  - Appreciate pharmacy assistance regarding Lamictal titration plan  - Continue home Depakote as above  - Slow Lamictal titration as above  - Continue Zyprexa 20mg  nightly   - Zyprexa 2.5 BID PRN for anxiety and insomnia     #Alcohol Use Disorder: Pt reports chronic alcohol use history. Has been sober over past 6 weeks so lower concern for withdrawal symptoms. Patient has not seized in the ED but did exhibit some bilateral UE tremulousness on the morning of 7/29. Monitored on CIWA.  - Continue Gabapentin 300mg  BID (to help with withdrawal symptoms, chronic pain and anxiety)  - CIWA scoring (no Ativan ordered)  - Daily Thiamine     #Tobacco Use Disorder:  - Nicotine patch     # Discharge Planning:   - Case management: pending.  - Social work: pending.  - PT: 5xL  - OT: 5xL  - SLP: N/A  - PM&R: N/A  - Expected Discharge Disposition: TBD.    - Will need Neurology follow-up    # Checklist:  - Diet: Regular diet and 1.5L fluid restriction  - IV fluids: no  - Bowel Regimen: No indication for a bowel regimen at this time (reason: having BMs)  - GI PPX: No GI indications  - DVT PPX: Lovenox 40 mg  daily  - Lines/Access: PIV x2.    - Foley: No    This patient was seen and discussed with Dr. Beverly Milch??var-Su??rez, who agrees with the above assessment and plan.      Please page the Neurology Team B Resident at [phone] for any questions/concerns.    Alwyn Pea, MD  PGY-2 Resident  [facility] Department of Neurology  I have personally seen the patient with the resident physician responsible for the patient encounter. We have discussed the history, physical exam and formulated an assessment plan together.     Sindy Mccune L. Andreas Newport, MD  Clinical Associate Professor of Neurology  [facility]       Data Review:       Contact Information:  Family contact: Wife  PCP: Fabio Pierce, MD    Medications:  Scheduled medications:    dilTIAZem  360 mg Oral Q24H    divalproex ER  750 mg Oral At bedtime    enoxaparin (LOVENOX) injection  40 mg Subcutaneous Q24H    folic acid  1 mg Oral Daily    gabapentin  300 mg Oral BID    lamoTRIgine  25 mg Oral Every other day    melatonin  3 mg Oral QPM    nicotine  1 patch Transdermal Daily    OLANZapine  20 mg Oral Nightly    polyethylene glycol  17 g Oral Daily    sodium chloride  1 g Oral 3xd Meals    thiamine mononitrate (vit B1)  100 mg Oral Daily     Continuous infusions:   PRN medications: acetaminophen, OLANZapine, senna    24 hour vital signs:  Temp:  [36.5 ??C (97.7 ??F)-37 ??C (98.6 ??F)] 36.5 ??C (97.7 ??F)  Heart Rate:  [57-97] 65  SpO2 Pulse:  [59] 59  Resp:  [16-18] 17  BP: (110-129)/(67-84) 124/84  MAP (mmHg):  [79-96] 96  SpO2:  [95 %-100 %] 100 %  BMI (Calculated):  [25.83] 25.83    Ins and Outs:  I/O this shift:  In: 730 [P.O.:730]  Out: 375 [Urine:375]    Laboratory values:  All Labs Last 24hrs:   Recent Results (from the past 24 hour(s))   Basic Metabolic Panel    Collection Time: 08/17/20  4:51 AM   Result Value Ref Range    Sodium 130 (L) 135 - 145 mmol/L    Potassium 3.7 3.4 - 4.8 mmol/L    Chloride 100 98 - 107 mmol/L    CO2 22.0 20.0 - 31.0 mmol/L    Anion Gap 8 5 - 14 mmol/L    BUN 7 (L) 9 - 23 mg/dL    Creatinine 5.78 4.69 - 1.10 mg/dL    BUN/Creatinine Ratio 11     eGFR CKD-EPI (2021) Male >90 >=60 mL/min/1.40m2    Glucose 124 70 - 179 mg/dL    Calcium 8.7 8.7 - 62.9 mg/dL   Magnesium Level    Collection Time: 08/17/20  4:51 AM   Result Value Ref Range    Magnesium 1.6 1.6 - 2.6 mg/dL   Phosphorus Level    Collection Time: 08/17/20  4:51 AM   Result Value Ref Range    Phosphorus 4.5 2.4 - 5.1 mg/dL   CBC w/ Differential    Collection Time: 08/17/20  4:51 AM   Result Value Ref Range    WBC 6.1 3.6 - 11.2 10*9/L    RBC 3.35 (L) 4.26 - 5.60 10*12/L    HGB 12.5 (L) 12.9 - 16.5 g/dL    HCT 52.8 (L) 41.3 - 48.0 %    MCV 103.0 (H) 77.6 - 95.7 fL    MCH 37.3 (H) 25.9 - 32.4 pg    MCHC 36.2 (H) 32.0 - 36.0 g/dL    RDW 24.4 01.0 - 27.2 %    MPV 7.8 6.8 - 10.7 fL    Platelet  260 150 - 450 10*9/L    Neutrophils % 50.5 %    Lymphocytes % 31.3 %    Monocytes % 11.7 %    Eosinophils % 5.5 %    Basophils % 1.0 %    Absolute Neutrophils 3.1 1.8 - 7.8 10*9/L    Absolute Lymphocytes 1.9 1.1 - 3.6 10*9/L    Absolute Monocytes 0.7 0.3 - 0.8 10*9/L    Absolute Eosinophils 0.3 0.0 - 0.5 10*9/L    Absolute Basophils 0.1 0.0 - 0.1 10*9/L    Macrocytosis Slight (A) Not Present   Basic Metabolic Panel    Collection Time: 08/17/20  1:15 PM   Result Value Ref Range    Sodium 135 135 - 145 mmol/L    Potassium 4.2 3.4 - 4.8 mmol/L    Chloride 101 98 - 107 mmol/L    CO2 28.0 20.0 - 31.0 mmol/L    Anion Gap 6 5 - 14 mmol/L    BUN 8 (L) 9 - 23 mg/dL    Creatinine 1.61 0.96 - 1.10 mg/dL    BUN/Creatinine Ratio 11     eGFR CKD-EPI (2021) Male >90 >=60 mL/min/1.67m2    Glucose 91 70 - 179 mg/dL    Calcium 9.0 8.7 - 04.5 mg/dL       Imaging:  Pertinent imaging discussed in the A/P section

## 2020-08-18 LAB — CBC W/ AUTO DIFF
BASOPHILS ABSOLUTE COUNT: 0.1 10*9/L (ref 0.0–0.1)
BASOPHILS RELATIVE PERCENT: 1 %
EOSINOPHILS ABSOLUTE COUNT: 0.3 10*9/L (ref 0.0–0.5)
EOSINOPHILS RELATIVE PERCENT: 5.3 %
HEMATOCRIT: 34.9 % — ABNORMAL LOW (ref 39.0–48.0)
HEMOGLOBIN: 12.4 g/dL — ABNORMAL LOW (ref 12.9–16.5)
LYMPHOCYTES ABSOLUTE COUNT: 2.1 10*9/L (ref 1.1–3.6)
LYMPHOCYTES RELATIVE PERCENT: 33.2 %
MEAN CORPUSCULAR HEMOGLOBIN CONC: 35.6 g/dL (ref 32.0–36.0)
MEAN CORPUSCULAR HEMOGLOBIN: 36.8 pg — ABNORMAL HIGH (ref 25.9–32.4)
MEAN CORPUSCULAR VOLUME: 103.4 fL — ABNORMAL HIGH (ref 77.6–95.7)
MEAN PLATELET VOLUME: 7.9 fL (ref 6.8–10.7)
MONOCYTES ABSOLUTE COUNT: 0.8 10*9/L (ref 0.3–0.8)
MONOCYTES RELATIVE PERCENT: 12.5 %
NEUTROPHILS ABSOLUTE COUNT: 3.1 10*9/L (ref 1.8–7.8)
NEUTROPHILS RELATIVE PERCENT: 48 %
PLATELET COUNT: 252 10*9/L (ref 150–450)
RED BLOOD CELL COUNT: 3.37 10*12/L — ABNORMAL LOW (ref 4.26–5.60)
RED CELL DISTRIBUTION WIDTH: 13.8 % (ref 12.2–15.2)
WBC ADJUSTED: 6.4 10*9/L (ref 3.6–11.2)

## 2020-08-18 LAB — BASIC METABOLIC PANEL
ANION GAP: 4 mmol/L — ABNORMAL LOW (ref 5–14)
ANION GAP: 6 mmol/L (ref 5–14)
BLOOD UREA NITROGEN: 8 mg/dL — ABNORMAL LOW (ref 9–23)
BLOOD UREA NITROGEN: 6 mg/dL — ABNORMAL LOW (ref 9–23)
BUN / CREAT RATIO: 14
BUN / CREAT RATIO: 10
CALCIUM: 8.7 mg/dL (ref 8.7–10.4)
CALCIUM: 9.1 mg/dL (ref 8.7–10.4)
CHLORIDE: 101 mmol/L (ref 98–107)
CHLORIDE: 101 mmol/L (ref 98–107)
CO2: 24 mmol/L (ref 20.0–31.0)
CO2: 27 mmol/L (ref 20.0–31.0)
CREATININE: 0.58 mg/dL — ABNORMAL LOW
CREATININE: 0.62 mg/dL
EGFR CKD-EPI (2021) MALE: >90 mL/min/{1.73_m2} (ref >=60)
EGFR CKD-EPI (2021) MALE: >90 mL/min/{1.73_m2} (ref >=60)
GLUCOSE RANDOM: 89 mg/dL (ref 70–179)
GLUCOSE RANDOM: 113 mg/dL (ref 70–179)
POTASSIUM: 4 mmol/L (ref 3.4–4.8)
POTASSIUM: 4.3 mmol/L (ref 3.4–4.8)
SODIUM: 129 mmol/L — ABNORMAL LOW (ref 135–145)
SODIUM: 134 mmol/L — ABNORMAL LOW (ref 135–145)

## 2020-08-18 LAB — MAGNESIUM: MAGNESIUM: 1.6 mg/dL (ref 1.6–2.6)

## 2020-08-18 LAB — PHOSPHORUS: PHOSPHORUS: 3.8 mg/dL (ref 2.4–5.1)

## 2020-08-18 MED ADMIN — folic acid (FOLVITE) tablet 1 mg: 1 mg | ORAL | @ 14:00:00

## 2020-08-18 MED ADMIN — nicotine (NICODERM CQ) 21 mg/24 hr patch 1 patch: 1 | TRANSDERMAL | @ 14:00:00

## 2020-08-18 MED ADMIN — enoxaparin (LOVENOX) syringe 40 mg: 40 mg | SUBCUTANEOUS | @ 14:00:00

## 2020-08-18 MED ADMIN — sodium chloride tablet 1 g: 1 g | ORAL | @ 22:00:00

## 2020-08-18 MED ADMIN — dilTIAZem (CARDIZEM CD) 24 hr capsule 360 mg: 360 mg | ORAL | @ 14:00:00

## 2020-08-18 MED ADMIN — melatonin tablet 3 mg: 3 mg | ORAL | @ 02:00:00

## 2020-08-18 MED ADMIN — gabapentin (NEURONTIN) capsule 300 mg: 300 mg | ORAL | @ 02:00:00

## 2020-08-18 MED ADMIN — OLANZapine (ZyPREXA) tablet 20 mg: 20 mg | ORAL | @ 02:00:00

## 2020-08-18 MED ADMIN — sodium chloride tablet 1 g: 1 g | ORAL | @ 14:00:00

## 2020-08-18 MED ADMIN — divalproex ER (DEPAKOTE ER) extended release 24 hr tablet 750 mg: 750 mg | ORAL | @ 02:00:00

## 2020-08-18 MED ADMIN — thiamine mononitrate (vit B1) tablet 100 mg: 100 mg | ORAL | @ 14:00:00

## 2020-08-18 MED ADMIN — lidocaine (LIDODERM) 5 % patch 2 patch: 2 | TRANSDERMAL | @ 16:00:00

## 2020-08-18 MED ADMIN — sodium chloride tablet 1 g: 1 g | ORAL | @ 16:00:00

## 2020-08-18 MED ADMIN — lamoTRIgine (LaMICtal) tablet 25 mg: 25 mg | ORAL | @ 14:00:00 | Stop: 2020-08-18

## 2020-08-18 MED ADMIN — gabapentin (NEURONTIN) capsule 300 mg: 300 mg | ORAL | @ 14:00:00

## 2020-08-18 MED ADMIN — polyethylene glycol (MIRALAX) packet 17 g: 17 g | ORAL | @ 14:00:00

## 2020-08-18 NOTE — Unmapped (Signed)
Neurology Inpatient Team B (NMB)  Daily Progress Note       Patient: Nicholas Rush  Code Status: Full Code  Level of Care: Acute floor status.   LOS: 2 days      Overnight Events & Subjective:     - Working on slow Lamictal titration; increased to 25 mg daily 7/31  - EEG discontinued 7/29  - Na 134 this morning  - PT/OT 5x low; patient willing to stay until Monday for rehab planning with CM     Physical Exam:     General Exam:  General Appearance: Well appearing. In no acute distress.  HEENT: Head is atraumatic and normocephalic. Sclera anicteric without injection. Oropharyngeal membranes are moist with no erythema or exudate.  Neck: Supple.  Lungs: Normal work of breathing.   Heart: warm and well perfused  Abdomen: Soft, nontender, nondistended.  Extremities: No clubbing, cyanosis, or edema.    Neurological Exam:  Mental Status: Alert, conversant, able to follow conversation and interview. Spontaneous speech was fluent without word finding pauses, dysarthria, or paraphasic errors. Comprehension was intact. Memory for recent and remote events was intact.  Cranial Nerves: Pursuit eye movements were grossly uninterrupted with full range. Face symmetric at rest. Normal facial movement bilaterally, including forehead, eye closure and grimace/smile. Hearing intact to conversation.   Motor Exam: Normal bulk.  Normal tone in the upper and lower extremities.  5/5 throughout uppers; 4+/5 in lowers  Reflexes: Deferred- DTRs are 2+ and symmetric throughout. Toes are downgoing bilaterally.  Sensory: Deferred- Sensation normal to light touch in both hands and both feet and to vibration distally in the fingers and toes.    Cerebellar/Coordination/Gait:  did not assess gait today.       Assessment/Plan:     Assessment: Nicholas Rush is a 55 y.o. male with a past medical history of HTN, Paroxysmal Afib, Bipolar I Disorder, Anxiety, Depression, Alcohol Use disorder c/b withdrawal seizures, tobacco use, Psoriatic arthritis on Humara who was admitted to [facility] on 08/15/2020 for seizure acitivity.     #Epilepsy and alcohol withdrawal seizures: Pt has long history of alcohol withdrawal seizures and seizures in setting of hyponatremia (Na to 111). Presented to ED after 5 minute seizure with 20 minutes of post-ictal confusion prior to returning to baseline on 7/28. Unlikely that seizure is secondary to alcohol withdrawal given sobriety for 6 weeks with ethanol level wnl. Hyponatremia could potentially be lowering seizure threshold but level is within typical range on presentation. MRI and routine EEG in the past have been without seizure focus. Pt does not have obvious provoking factor at this time and history is concerning for potential epilepsy. He has been on Depakote, as below, for bipolar disorder but not at a therapeutic level for seizure control. At this point, treatment with an AED to therapeutic levels is warranted. Per discussion with both psychiatry and pharmacy, will do slow titration of Lamictal as below.      - Continue Depakote 750 nightly until Lamictal at therapeutic level  - Given interaction of Depakote and Lamictal, initiate Lamictal at 25 mg every other day for 2 weeks. After 2 weeks, may increase based on response and tolerability (weeks 3 and 4: 25 mg daily; week 5 and beyond: increase daily dose by 25 to 50 mg every 1 to 2 weeks.  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 Risk/benefit of SIADH (relatively well controlled with fluid restriction and salt tabs) and Depakote treatment (has been the most effective for bipolar as below) was discussed with patient and wife previously and decision was made to continue Depakote for mood symptoms. Sodium is 127 (within normal baseline for patient) on presentation. Despite thought that SIADH is most likely due to Depakote use, patient has had come positive cancer screenings that need followed up in the outpatient setting, as below.  - BID BMP  - Continue 1g NaCl tabs TID  - 1.5 L fluid restriction  - Outpatient follow-up for prior history of positive cologard but no colonoscopy; prostate with calcifications on CT scan (PSA WNL 7/29); and CT chest with RUL nodules (recommended yearly scans)     #Paroxsymal Afib:   - Continue Diltiazem 360mg  q24hr  - Monitor on telemetry  - Discuss anticoagulation in outpatient setting     #Bipolar Disorder: Follows with [facility] Psychiatry. Improvement in Bipolar symptoms while on Depakote. Was briefly switched to Lithium in the past but could not tolerate due to side effects. Per chart review, control of mood symptoms is high priority to patient and family. Now that he warrants treatment for suspected epilepsy, it will be important to determine if mood disorder and seizures can be treated with the same medication or if he requires 2 medications to treat the 2 conditions. Unfortunately due to SIADH as above, titration of Depakote is not possible.   - Psych following; appreciate assistance  - Appreciate pharmacy assistance regarding Lamictal titration plan  - Continue home Depakote as above  - Slow Lamictal titration as above  - Continue Zyprexa 20mg  nightly   - Zyprexa 2.5 BID PRN for anxiety and insomnia     #Alcohol Use Disorder: Pt reports chronic alcohol use history. Has been sober over past 6 weeks so lower concern for withdrawal symptoms. Patient has not seized in the ED but did exhibit some bilateral UE tremulousness on the morning of 7/29. Monitored on CIWA.  - Continue Gabapentin 300mg  BID (to help with withdrawal symptoms, chronic pain and anxiety)  - CIWA scoring (no Ativan ordered)  - Daily Thiamine     #Tobacco Use Disorder:  - Nicotine patch     # Discharge Planning:   - Case management: consulted. Recommendations appreciated.  - Social work: pending.  - PT: 5xL  - OT: 5xL  - SLP: N/A  - PM&R: N/A  - Expected Discharge Disposition: TBD.    - Will need Neurology follow-up    # Checklist:  - Diet: Regular diet and 1.5L fluid restriction  - IV fluids: no  - Bowel Regimen: Miralax 17 g daily  - GI PPX: No GI indications  - DVT PPX: Lovenox 40 mg [place] daily  - Lines/Access: None.    - Foley: No    This patient was seen and discussed with Dr. Beverly Milch??var-Su??rez, who agrees with the above assessment and plan.      Please page the Neurology Team B Resident at [phone] for any questions/concerns.    Annalee Genta, MD  PGY-4  [facility] Department of Neurology          I have personally seen the patient with the resident physician responsible for the patient encounter. We have discussed the history, physical exam and formulated an assessment plan together.     Momoka Stringfield L. Andreas Newport, MD  Clinical Associate Professor of Neurology  [facility]  Data Review:       Contact Information:  Family contact: Wife  PCP: Fabio Pierce, MD    Medications:  Scheduled medications:    dilTIAZem  360 mg Oral Q24H    divalproex ER  750 mg Oral At bedtime    enoxaparin (LOVENOX) injection  40 mg Subcutaneous Q24H    folic acid  1 mg Oral Daily    gabapentin  300 mg Oral BID    lamoTRIgine  25 mg Oral Every other day    lidocaine  2 patch Transdermal Daily    melatonin  3 mg Oral QPM    nicotine  1 patch Transdermal Daily    OLANZapine  20 mg Oral Nightly    polyethylene glycol  17 g Oral Daily    sodium chloride  1 g Oral 3xd Meals    thiamine mononitrate (vit B1)  100 mg Oral Daily     Continuous infusions:   PRN medications: acetaminophen, OLANZapine, senna    24 hour vital signs:  Temp:  [36.6 ??C (97.9 ??F)-36.9 ??C (98.4 ??F)] 36.6 ??C (97.9 ??F)  Heart Rate:  [62-95] 62  Resp:  [18-20] 18  BP: (117-138)/(64-95) 136/81  MAP (mmHg):  [81-109] 98  SpO2:  [94 %-100 %] 100 %    Ins and Outs:  I/O this shift:  In: 506 [P.O.:506]  Out: 1150 [Urine:1150]    Laboratory values:  All Labs Last 24hrs:   Recent Results (from the past 24 hour(s))   Basic Metabolic Panel    Collection Time: 08/18/20  5:02 AM   Result Value Ref Range    Sodium 129 (L) 135 - 145 mmol/L    Potassium 4.0 3.4 - 4.8 mmol/L    Chloride 101 98 - 107 mmol/L    CO2 24.0 20.0 - 31.0 mmol/L    Anion Gap 4 (L) 5 - 14 mmol/L    BUN 8 (L) 9 - 23 mg/dL    Creatinine 1.61 (L) 0.60 - 1.10 mg/dL    BUN/Creatinine Ratio 14     eGFR CKD-EPI (2021) Male >90 >=60 mL/min/1.57m2    Glucose 89 70 - 179 mg/dL    Calcium 8.7 8.7 - 09.6 mg/dL   Magnesium Level    Collection Time: 08/18/20  5:02 AM   Result Value Ref Range    Magnesium 1.6 1.6 - 2.6 mg/dL   Phosphorus Level    Collection Time: 08/18/20  5:02 AM   Result Value Ref Range    Phosphorus 3.8 2.4 - 5.1 mg/dL   CBC w/ Differential    Collection Time: 08/18/20  5:02 AM   Result Value Ref Range    WBC 6.4 3.6 - 11.2 10*9/L    RBC 3.37 (L) 4.26 - 5.60 10*12/L    HGB 12.4 (L) 12.9 - 16.5 g/dL    HCT 04.5 (L) 40.9 - 48.0 %    MCV 103.4 (H) 77.6 - 95.7 fL    MCH 36.8 (H) 25.9 - 32.4 pg    MCHC 35.6 32.0 - 36.0 g/dL    RDW 81.1 91.4 - 78.2 %    MPV 7.9 6.8 - 10.7 fL    Platelet 252 150 - 450 10*9/L    Neutrophils % 48.0 %    Lymphocytes % 33.2 %    Monocytes % 12.5 %    Eosinophils % 5.3 %    Basophils % 1.0 %    Absolute Neutrophils 3.1 1.8 - 7.8 10*9/L    Absolute  Lymphocytes 2.1 1.1 - 3.6 10*9/L    Absolute Monocytes 0.8 0.3 - 0.8 10*9/L    Absolute Eosinophils 0.3 0.0 - 0.5 10*9/L    Absolute Basophils 0.1 0.0 - 0.1 10*9/L    Macrocytosis Slight (A) Not Present   Basic Metabolic Panel    Collection Time: 08/18/20 12:48 PM   Result Value Ref Range    Sodium 134 (L) 135 - 145 mmol/L    Potassium 4.3 3.4 - 4.8 mmol/L    Chloride 101 98 - 107 mmol/L    CO2 27.0 20.0 - 31.0 mmol/L    Anion Gap 6 5 - 14 mmol/L    BUN 6 (L) 9 - 23 mg/dL    Creatinine 1.61 0.96 - 1.10 mg/dL    BUN/Creatinine Ratio 10     eGFR CKD-EPI (2021) Male >90 >=60 mL/min/1.89m2    Glucose 113 70 - 179 mg/dL    Calcium 9.1 8.7 - 04.5 mg/dL       Imaging:  Pertinent imaging discussed in the A/P section

## 2020-08-18 NOTE — Unmapped (Signed)
Problem: Adult Inpatient Plan of Care  Goal: Plan of Care Review  Outcome: Progressing  Goal: Patient-Specific Goal (Individualized)  Outcome: Progressing  Flowsheets (Taken 08/18/2020 1524)  Patient-Specific Goals (Include Timeframe): Pt will ambulate in hallway by 8/1  Note: A/O x 4. VSS. No neuro changes noted. Safety precautions maintained. Able to reposition self. Remains free of falls.   Pt c/o pain improved with lidocaine patch. Voiding. No BM this shift. Appetite adequate. Able to conduct most ADLs with minimal assistance. Very unsteady gait. OOB to chair this afternoon with 2 assist. No family at bedside.  Pt in bed. Low and locked position. Chair alarm on. Call bell within reach. No needs noted/expressed at this time.    Goal: Absence of Hospital-Acquired Illness or Injury  Outcome: Progressing  Intervention: Identify and Manage Fall Risk  Recent Flowsheet Documentation  Taken 08/18/2020 1400 by Osie Cheeks, RN  Safety Interventions:   bed alarm   fall reduction program maintained   low bed   nonskid shoes/slippers when out of bed   room near unit station   supervised activity   toileting scheduled  Taken 08/18/2020 1200 by Osie Cheeks, RN  Safety Interventions:   bed alarm   fall reduction program maintained   low bed   nonskid shoes/slippers when out of bed   room near unit station  Taken 08/18/2020 1000 by Osie Cheeks, RN  Safety Interventions:   bed alarm   fall reduction program maintained   low bed   nonskid shoes/slippers when out of bed   room near unit station   supervised activity   toileting scheduled  Taken 08/18/2020 0800 by Osie Cheeks, RN  Safety Interventions:   bed alarm   fall reduction program maintained   low bed   nonskid shoes/slippers when out of bed   room near unit station   supervised activity   toileting scheduled  Intervention: Prevent Skin Injury  Recent Flowsheet Documentation  Taken 08/18/2020 0800 by Osie Cheeks, RN  Skin Protection:   adhesive use limited   incontinence pads utilized   tubing/devices free from skin contact   transparent dressing maintained  Intervention: Prevent and Manage VTE (Venous Thromboembolism) Risk  Recent Flowsheet Documentation  Taken 08/18/2020 1000 by Osie Cheeks, RN  Activity Management: sitting, edge of bed  Taken 08/18/2020 0955 by Osie Cheeks, RN  VTE Prevention/Management:   ambulation promoted   anticoagulant therapy   dorsiflexion/plantar flexion performed   fluids promoted  Taken 08/18/2020 0800 by Osie Cheeks, RN  Activity Management: activity encouraged  Goal: Optimal Comfort and Wellbeing  Outcome: Progressing  Goal: Readiness for Transition of Care  Outcome: Progressing  Goal: Rounds/Family Conference  Outcome: Progressing     Problem: Seizure Disorder Comorbidity  Goal: Maintenance of Seizure Control  Outcome: Progressing     Problem: Fall Injury Risk  Goal: Absence of Fall and Fall-Related Injury  Outcome: Progressing  Intervention: Promote Injury-Free Environment  Recent Flowsheet Documentation  Taken 08/18/2020 1400 by Osie Cheeks, RN  Safety Interventions:   bed alarm   fall reduction program maintained   low bed   nonskid shoes/slippers when out of bed   room near unit station   supervised activity   toileting scheduled  Taken 08/18/2020 1200 by Osie Cheeks, RN  Safety Interventions:   bed alarm   fall reduction program maintained   low bed   nonskid shoes/slippers when out of bed   room  near unit station  Taken 08/18/2020 1000 by Osie Cheeks, RN  Safety Interventions:   bed alarm   fall reduction program maintained   low bed   nonskid shoes/slippers when out of bed   room near unit station   supervised activity   toileting scheduled  Taken 08/18/2020 0800 by Osie Cheeks, RN  Safety Interventions:   bed alarm   fall reduction program maintained   low bed   nonskid shoes/slippers when out of bed   room near unit station   supervised activity   toileting scheduled Problem: Self-Care Deficit  Goal: Improved Ability to Complete Activities of Daily Living  Outcome: Progressing

## 2020-08-18 NOTE — Unmapped (Addendum)
Patient is Ox4, follows commands. Weakness with ambulation, utilizes walker. Voiding per urinal. No falls or injuries noted. Seizure precautions in place. Bed in low and locked position.   VSS. Denies pain. Tolerating regular diet. No further needs, WCTM.       Problem: Adult Inpatient Plan of Care  Goal: Plan of Care Review  Outcome: Progressing  Goal: Patient-Specific Goal (Individualized)  Outcome: Progressing  Flowsheets (Taken 08/18/2020 0542)  Patient-Specific Goals (Include Timeframe): Patient will be oob in chair for meals tid by 7/31  Goal: Absence of Hospital-Acquired Illness or Injury  Outcome: Progressing  Intervention: Identify and Manage Fall Risk  Recent Flowsheet Documentation  Taken 08/17/2020 2000 by Isidore Moos, RN  Safety Interventions:  ??? bed alarm  ??? fall reduction program maintained  ??? low bed  ??? nonskid shoes/slippers when out of bed  ??? seizure precautions  Goal: Optimal Comfort and Wellbeing  Outcome: Progressing  Goal: Readiness for Transition of Care  Outcome: Progressing  Goal: Rounds/Family Conference  Outcome: Progressing     Problem: Seizure Disorder Comorbidity  Goal: Maintenance of Seizure Control  Outcome: Progressing  Intervention: Maintain Seizure-Symptom Control  Recent Flowsheet Documentation  Taken 08/17/2020 2000 by Isidore Moos, RN  Seizure Precautions: side rails padded     Problem: Fall Injury Risk  Goal: Absence of Fall and Fall-Related Injury  Outcome: Progressing  Intervention: Promote Scientist, clinical (histocompatibility and immunogenetics) Documentation  Taken 08/17/2020 2000 by Isidore Moos, RN  Safety Interventions:  ??? bed alarm  ??? fall reduction program maintained  ??? low bed  ??? nonskid shoes/slippers when out of bed  ??? seizure precautions     Problem: Self-Care Deficit  Goal: Improved Ability to Complete Activities of Daily Living  Outcome: Progressing

## 2020-08-19 LAB — BASIC METABOLIC PANEL
ANION GAP: 5 mmol/L (ref 5–14)
ANION GAP: 7 mmol/L (ref 5–14)
BLOOD UREA NITROGEN: 10 mg/dL (ref 9–23)
BLOOD UREA NITROGEN: 10 mg/dL (ref 9–23)
BUN / CREAT RATIO: 15
BUN / CREAT RATIO: 16
CALCIUM: 9.2 mg/dL (ref 8.7–10.4)
CALCIUM: 9 mg/dL (ref 8.7–10.4)
CHLORIDE: 102 mmol/L (ref 98–107)
CHLORIDE: 103 mmol/L (ref 98–107)
CO2: 27 mmol/L (ref 20.0–31.0)
CO2: 26 mmol/L (ref 20.0–31.0)
CREATININE: 0.68 mg/dL
CREATININE: 0.61 mg/dL
EGFR CKD-EPI (2021) MALE: >90 mL/min/{1.73_m2} (ref >=60)
EGFR CKD-EPI (2021) MALE: >90 mL/min/{1.73_m2} (ref >=60)
GLUCOSE RANDOM: 92 mg/dL (ref 70–179)
GLUCOSE RANDOM: 131 mg/dL — ABNORMAL HIGH (ref 70–99)
POTASSIUM: 4.4 mmol/L (ref 3.4–4.8)
POTASSIUM: 3.9 mmol/L (ref 3.4–4.8)
SODIUM: 134 mmol/L — ABNORMAL LOW (ref 135–145)
SODIUM: 136 mmol/L (ref 135–145)

## 2020-08-19 LAB — CBC W/ AUTO DIFF
BASOPHILS ABSOLUTE COUNT: 0 10*9/L (ref 0.0–0.1)
BASOPHILS RELATIVE PERCENT: 0.5 %
EOSINOPHILS ABSOLUTE COUNT: 0.4 10*9/L (ref 0.0–0.5)
EOSINOPHILS RELATIVE PERCENT: 6.6 %
HEMATOCRIT: 36.1 % — ABNORMAL LOW (ref 39.0–48.0)
HEMOGLOBIN: 12.6 g/dL — ABNORMAL LOW (ref 12.9–16.5)
LYMPHOCYTES ABSOLUTE COUNT: 2.3 10*9/L (ref 1.1–3.6)
LYMPHOCYTES RELATIVE PERCENT: 37.7 %
MEAN CORPUSCULAR HEMOGLOBIN CONC: 34.9 g/dL (ref 32.0–36.0)
MEAN CORPUSCULAR HEMOGLOBIN: 35.8 pg — ABNORMAL HIGH (ref 25.9–32.4)
MEAN CORPUSCULAR VOLUME: 102.6 fL — ABNORMAL HIGH (ref 77.6–95.7)
MEAN PLATELET VOLUME: 7.7 fL (ref 6.8–10.7)
MONOCYTES ABSOLUTE COUNT: 0.8 10*9/L (ref 0.3–0.8)
MONOCYTES RELATIVE PERCENT: 13.6 %
NEUTROPHILS ABSOLUTE COUNT: 2.6 10*9/L (ref 1.8–7.8)
NEUTROPHILS RELATIVE PERCENT: 41.6 %
PLATELET COUNT: 259 10*9/L (ref 150–450)
RED BLOOD CELL COUNT: 3.52 10*12/L — ABNORMAL LOW (ref 4.26–5.60)
RED CELL DISTRIBUTION WIDTH: 14 % (ref 12.2–15.2)
WBC ADJUSTED: 6.2 10*9/L (ref 3.6–11.2)

## 2020-08-19 LAB — PHOSPHORUS: PHOSPHORUS: 4.8 mg/dL (ref 2.4–5.1)

## 2020-08-19 LAB — MAGNESIUM: MAGNESIUM: 1.6 mg/dL (ref 1.6–2.6)

## 2020-08-19 MED ADMIN — sodium chloride tablet 1 g: 1 g | ORAL | @ 22:00:00

## 2020-08-19 MED ADMIN — magnesium oxide (MAG-OX) tablet 400 mg: 400 mg | ORAL | @ 18:00:00 | Stop: 2020-08-19

## 2020-08-19 MED ADMIN — OLANZapine (ZyPREXA) tablet 20 mg: 20 mg | ORAL | @ 01:00:00

## 2020-08-19 MED ADMIN — sodium chloride tablet 1 g: 1 g | ORAL | @ 18:00:00

## 2020-08-19 MED ADMIN — magnesium oxide (MAG-OX) tablet 800 mg: 800 mg | ORAL | @ 22:00:00 | Stop: 2020-08-19

## 2020-08-19 MED ADMIN — nicotine (NICODERM CQ) 21 mg/24 hr patch 1 patch: 1 | TRANSDERMAL | @ 14:00:00

## 2020-08-19 MED ADMIN — dilTIAZem (CARDIZEM CD) 24 hr capsule 360 mg: 360 mg | ORAL | @ 14:00:00

## 2020-08-19 MED ADMIN — polyethylene glycol (MIRALAX) packet 17 g: 17 g | ORAL | @ 14:00:00

## 2020-08-19 MED ADMIN — gabapentin (NEURONTIN) capsule 300 mg: 300 mg | ORAL | @ 14:00:00

## 2020-08-19 MED ADMIN — melatonin tablet 3 mg: 3 mg | ORAL | @ 01:00:00

## 2020-08-19 MED ADMIN — gabapentin (NEURONTIN) capsule 300 mg: 300 mg | ORAL | @ 01:00:00

## 2020-08-19 MED ADMIN — thiamine mononitrate (vit B1) tablet 100 mg: 100 mg | ORAL | @ 14:00:00

## 2020-08-19 MED ADMIN — enoxaparin (LOVENOX) syringe 40 mg: 40 mg | SUBCUTANEOUS | @ 14:00:00

## 2020-08-19 MED ADMIN — sodium chloride tablet 1 g: 1 g | ORAL | @ 14:00:00

## 2020-08-19 MED ADMIN — divalproex ER (DEPAKOTE ER) extended release 24 hr tablet 750 mg: 750 mg | ORAL | @ 01:00:00

## 2020-08-19 MED ADMIN — lamoTRIgine (LaMICtal) tablet 25 mg: 25 mg | ORAL | @ 19:00:00

## 2020-08-19 MED ADMIN — lidocaine (LIDODERM) 5 % patch 2 patch: 2 | TRANSDERMAL | @ 14:00:00

## 2020-08-19 MED ADMIN — folic acid (FOLVITE) tablet 1 mg: 1 mg | ORAL | @ 14:00:00

## 2020-08-19 NOTE — Unmapped (Addendum)
Patient is Ox4, follows commands. Weakness with ambulation, utilizes walker. Voiding per urinal. No falls or injuries noted. Seizure precautions in place. Bed in low and locked position. VSS. Denies pain. Tolerating regular diet. No further needs, WCTM.     Problem: Adult Inpatient Plan of Care  Goal: Plan of Care Review  Outcome: Progressing  Goal: Patient-Specific Goal (Individualized)  Outcome: Progressing  Flowsheets (Taken 08/19/2020 4540)  Patient-Specific Goals (Include Timeframe): Pt will be discharged today 8/1  Goal: Absence of Hospital-Acquired Illness or Injury  Outcome: Progressing  Goal: Optimal Comfort and Wellbeing  Outcome: Progressing  Goal: Readiness for Transition of Care  Outcome: Progressing  Goal: Rounds/Family Conference  Outcome: Progressing     Problem: Seizure Disorder Comorbidity  Goal: Maintenance of Seizure Control  Outcome: Progressing     Problem: Fall Injury Risk  Goal: Absence of Fall and Fall-Related Injury  Outcome: Progressing     Problem: Self-Care Deficit  Goal: Improved Ability to Complete Activities of Daily Living  Outcome: Progressing

## 2020-08-19 NOTE — Unmapped (Signed)
Care Management  Initial Transition Planning Assessment              General  Care Manager assessed the patient by : In person interview with patient  Orientation Level: Oriented X4  Functional level prior to admission: Independent  Reason for referral: Discharge Planning    Contact/Decision [facility]  Extended Emergency Contact Information  Primary Emergency Contact: Bisbee,[name]  Address: [address] [place], [place] [address] [place] [employer] Phone: [phone]  Relation: Spouse  Preferred language: ENGLISH  Interpreter needed? No  Secondary Emergency Contact: Wallene D[place]  Mobile Phone: [phone]  Relation: Daughter  Interpreter needed? No    Legal Next of Kin / Guardian / POA / Advance Directives     HCDM (patient stated preference): Bisbee,[name] - Spouse - [phone]    Advance Directive (Medical Treatment)  Does patient have an advance directive covering medical treatment?: Patient has advance directive covering medical treatment, copy in chart.  Advance directive covering medical treatment not in Chart:: Copy requested from family  Reason patient does not have an advance directive covering medical treatment:: Patient does not wish to complete one at this time.    Health Care Decision Maker [HCDM] (Medical & Mental Health Treatment)  Healthcare Decision Maker: Patient does not wish to appoint a Health Care Decision Maker at this time  Information offered on HCDM, Medical & Mental Health advance directives:: Patient declined information.         Readmission Information                                     Did the following happen with your discharge?                                                     Patient Information  Lives with: Spouse/significant other, Family members    Type of Residence: Private residence             Support Systems/Concerns: Spouse    Responsibilities/Dependents at home?: Yes (Describe)    Home Care services in place prior to admission?: No                  Equipment Currently Used at Home: walker, rolling       Currently receiving outpatient dialysis?: No       Financial Information       Need for financial assistance?: No       Social Determinants of Health  Social Determinants of Health were addressed in provider documentation.  Please refer to patient history.    Complex Discharge Information    Is patient identified as a difficult/complex discharge?: No                                                               Interventions:       Discharge Needs Assessment  Concerns to be Addressed:      Clinical Risk Factors:  Barriers to taking medications: No    Prior overnight hospital stay or ED visit in last 90 days: No              Anticipated Changes Related to Illness: other (see comments) (cont to follow for related changes)    Equipment Needed After Discharge: other (see comments) (cont to follow for related changes)    Discharge Facility/Level of Care Needs:      Readmission  Risk of Unplanned Readmission Score: UNPLANNED READMISSION SCORE: 48.37%  Predictive Model Details          48% (High)  Factor Value    Calculated 08/19/2020 08:03 22% Number of hospitalizations in last year 8    [facility] Risk of Unplanned Readmission Model 19% Number of ED visits in last six months 6     15% Number of active Rx orders 39     5% Active antipsychotic Rx order present     5% ECG/EKG order present in last 6 months     4% Encounter of ten days or longer in last year present     4% Diagnosis of electrolyte disorder present     4% Restraint order present in last 6 months     4% Imaging order present in last 6 months     3% Latest hemoglobin low (12.6 g/dL)     3% Phosphorous result present     3% Diagnosis of deficiency anemia present     3% Age 55     3% Active anticoagulant Rx order present     2% Current length of stay 3.207 days     1% Future appointment scheduled      Readmitted Within the Last 30 Days? (No if blank) Yes  Patient at risk for readmission?: No    Discharge Plan  Screen findings are: Care Manager reviewed the plan of the patient's care with the Multidisciplinary Team. No discharge planning needs identified at this time. Care Manager will continue to manage plan and monitor patient's progress with the team.    Expected Discharge Date: 08/21/2020    Expected Transfer from Critical Care:         Patient and/or family were provided with choice of facilities / services that are available and appropriate to meet post hospital care needs?: Other (Comment) (choices to be provided if needed)       Initial Assessment complete?: Yes

## 2020-08-20 LAB — CBC W/ AUTO DIFF
BASOPHILS ABSOLUTE COUNT: 0.1 10*9/L (ref 0.0–0.1)
BASOPHILS RELATIVE PERCENT: 1.1 %
EOSINOPHILS ABSOLUTE COUNT: 0.4 10*9/L (ref 0.0–0.5)
EOSINOPHILS RELATIVE PERCENT: 7.3 %
HEMATOCRIT: 33.9 % — ABNORMAL LOW (ref 39.0–48.0)
HEMOGLOBIN: 12.1 g/dL — ABNORMAL LOW (ref 12.9–16.5)
LYMPHOCYTES ABSOLUTE COUNT: 2 10*9/L (ref 1.1–3.6)
LYMPHOCYTES RELATIVE PERCENT: 37.3 %
MEAN CORPUSCULAR HEMOGLOBIN CONC: 35.7 g/dL (ref 32.0–36.0)
MEAN CORPUSCULAR HEMOGLOBIN: 37 pg — ABNORMAL HIGH (ref 25.9–32.4)
MEAN CORPUSCULAR VOLUME: 103.6 fL — ABNORMAL HIGH (ref 77.6–95.7)
MEAN PLATELET VOLUME: 7.8 fL (ref 6.8–10.7)
MONOCYTES ABSOLUTE COUNT: 0.6 10*9/L (ref 0.3–0.8)
MONOCYTES RELATIVE PERCENT: 11.2 %
NEUTROPHILS ABSOLUTE COUNT: 2.3 10*9/L (ref 1.8–7.8)
NEUTROPHILS RELATIVE PERCENT: 43.1 %
PLATELET COUNT: 264 10*9/L (ref 150–450)
RED BLOOD CELL COUNT: 3.27 10*12/L — ABNORMAL LOW (ref 4.26–5.60)
RED CELL DISTRIBUTION WIDTH: 13.7 % (ref 12.2–15.2)
WBC ADJUSTED: 5.4 10*9/L (ref 3.6–11.2)

## 2020-08-20 LAB — BASIC METABOLIC PANEL
ANION GAP: 8 mmol/L (ref 5–14)
ANION GAP: 6 mmol/L (ref 5–14)
BLOOD UREA NITROGEN: 10 mg/dL (ref 9–23)
BLOOD UREA NITROGEN: 10 mg/dL (ref 9–23)
BUN / CREAT RATIO: 15
BUN / CREAT RATIO: 15
CALCIUM: 8.8 mg/dL (ref 8.7–10.4)
CALCIUM: 8.8 mg/dL (ref 8.7–10.4)
CHLORIDE: 105 mmol/L (ref 98–107)
CHLORIDE: 102 mmol/L (ref 98–107)
CO2: 24 mmol/L (ref 20.0–31.0)
CO2: 25 mmol/L (ref 20.0–31.0)
CREATININE: 0.67 mg/dL
CREATININE: 0.67 mg/dL
EGFR CKD-EPI (2021) MALE: >90 mL/min/{1.73_m2} (ref >=60)
EGFR CKD-EPI (2021) MALE: >90 mL/min/{1.73_m2} (ref >=60)
GLUCOSE RANDOM: 86 mg/dL (ref 70–179)
GLUCOSE RANDOM: 102 mg/dL (ref 70–179)
POTASSIUM: 4.2 mmol/L (ref 3.4–4.8)
POTASSIUM: 4.2 mmol/L (ref 3.4–4.8)
SODIUM: 137 mmol/L (ref 135–145)
SODIUM: 133 mmol/L — ABNORMAL LOW (ref 135–145)

## 2020-08-20 LAB — TB AG1: TB AG1 VALUE: 0.1

## 2020-08-20 LAB — TB MITOGEN: TB MITOGEN VALUE: 10

## 2020-08-20 LAB — QUANTIFERON TB GOLD PLUS
QUANTIFERON ANTIGEN 1 MINUS NIL: 0.03 [IU]/mL
QUANTIFERON ANTIGEN 2 MINUS NIL: 0 [IU]/mL
QUANTIFERON MITOGEN: 9.93 [IU]/mL
QUANTIFERON TB GOLD PLUS: NEGATIVE
QUANTIFERON TB NIL VALUE: 0.07 [IU]/mL

## 2020-08-20 LAB — TB NIL: TB NIL VALUE: 0.07

## 2020-08-20 LAB — PHOSPHORUS: PHOSPHORUS: 4.7 mg/dL (ref 2.4–5.1)

## 2020-08-20 LAB — TB AG2: TB AG2 VALUE: 0.07

## 2020-08-20 LAB — MAGNESIUM: MAGNESIUM: 1.7 mg/dL (ref 1.6–2.6)

## 2020-08-20 MED ADMIN — dilTIAZem (CARDIZEM CD) 24 hr capsule 360 mg: 360 mg | ORAL | @ 13:00:00

## 2020-08-20 MED ADMIN — sodium chloride tablet 1 g: 1 g | ORAL | @ 16:00:00

## 2020-08-20 MED ADMIN — lidocaine (LIDODERM) 5 % patch 2 patch: 2 | TRANSDERMAL | @ 13:00:00

## 2020-08-20 MED ADMIN — gabapentin (NEURONTIN) capsule 300 mg: 300 mg | ORAL | @ 01:00:00

## 2020-08-20 MED ADMIN — enoxaparin (LOVENOX) syringe 40 mg: 40 mg | SUBCUTANEOUS | @ 13:00:00

## 2020-08-20 MED ADMIN — thiamine mononitrate (vit B1) tablet 100 mg: 100 mg | ORAL | @ 13:00:00

## 2020-08-20 MED ADMIN — sodium chloride tablet 1 g: 1 g | ORAL | @ 23:00:00

## 2020-08-20 MED ADMIN — melatonin tablet 3 mg: 3 mg | ORAL | @ 01:00:00

## 2020-08-20 MED ADMIN — nicotine (NICODERM CQ) 21 mg/24 hr patch 1 patch: 1 | TRANSDERMAL | @ 13:00:00

## 2020-08-20 MED ADMIN — folic acid (FOLVITE) tablet 1 mg: 1 mg | ORAL | @ 13:00:00

## 2020-08-20 MED ADMIN — polyethylene glycol (MIRALAX) packet 17 g: 17 g | ORAL | @ 13:00:00

## 2020-08-20 MED ADMIN — gabapentin (NEURONTIN) capsule 300 mg: 300 mg | ORAL | @ 13:00:00

## 2020-08-20 MED ADMIN — sodium chloride tablet 1 g: 1 g | ORAL | @ 13:00:00

## 2020-08-20 MED ADMIN — divalproex ER (DEPAKOTE ER) extended release 24 hr tablet 750 mg: 750 mg | ORAL | @ 01:00:00

## 2020-08-20 MED ADMIN — lamoTRIgine (LaMICtal) tablet 25 mg: 25 mg | ORAL | @ 13:00:00

## 2020-08-20 MED ADMIN — OLANZapine (ZyPREXA) tablet 20 mg: 20 mg | ORAL | @ 01:00:00

## 2020-08-20 NOTE — Unmapped (Signed)
Pt alert and oriented throughout shift. Regular diet with 1.5L fluid restriction tolerated well. No c/o pain. Pt able to sleep throughout the night. Will continue to monitor.     Problem: Adult Inpatient Plan of Care  Goal: Plan of Care Review  Outcome: Ongoing - Unchanged  Goal: Patient-Specific Goal (Individualized)  Outcome: Ongoing - Unchanged  Flowsheets (Taken 08/20/2020 0521)  Patient-Specific Goals (Include Timeframe): pt will have a BM today  Goal: Absence of Hospital-Acquired Illness or Injury  Outcome: Ongoing - Unchanged  Intervention: Identify and Manage Fall Risk  Recent Flowsheet Documentation  Taken 08/19/2020 2000 by Gwynneth Macleod, RN  Safety Interventions:   bed alarm   fall reduction program maintained   low bed   nonskid shoes/slippers when out of bed  Goal: Optimal Comfort and Wellbeing  Outcome: Ongoing - Unchanged  Goal: Readiness for Transition of Care  Outcome: Ongoing - Unchanged  Goal: Rounds/Family Conference  Outcome: Ongoing - Unchanged

## 2020-08-20 NOTE — Unmapped (Signed)
Neurology Inpatient Team B (NMB)  Daily Progress Note       Patient: Nicholas Rush  Code Status: Full Code  Level of Care: Acute floor status.   LOS: 3 days       Assessment/Plan:     Assessment: Nicholas Rush is a 55 y.o. male with a past medical history of HTN, Paroxysmal Afib, Bipolar I Disorder, Anxiety, Depression, Alcohol Use disorder c/b withdrawal seizures, tobacco use, Psoriatic arthritis on Humara who was admitted to [facility] on 08/15/2020 for seizure acitivity.     #Epilepsy and alcohol withdrawal seizures: Pt has long history of alcohol withdrawal seizures and seizures in setting of hyponatremia (Na to 111). Presented to ED after 5 minute seizure with 20 minutes of post-ictal confusion prior to returning to baseline on 7/28. Unlikely that seizure is secondary to alcohol withdrawal given sobriety for 6 weeks with ethanol level wnl. Hyponatremia could potentially be lowering seizure threshold but level is within typical range on presentation. MRI and routine EEG in the past have been without seizure focus. Pt does not have obvious provoking factor at this time and history is concerning for potential epilepsy. He has been on Depakote, as below, for bipolar disorder but not at a therapeutic level for seizure control. At this point, treatment with an AED to therapeutic levels is warranted. Per discussion with both psychiatry and pharmacy, will increase titration of Lamictal as below.      - Continue Depakote 750 nightly until Lamictal at therapeutic level  - Given potentially synergistic interaction of Depakote and Lamictal, initiate Lamictal at 25 mg every day for 2 weeks. After 2 weeks will increase to 25 mg BID and decrease Depakote from 750 mg to 500 mg daily.  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 Risk/benefit of SIADH (relatively well controlled with fluid restriction and salt tabs) and Depakote treatment (has been the most effective for bipolar as below) was discussed with patient and wife previously and decision was made to continue Depakote for mood symptoms. Sodium is 127 (within normal baseline for patient) on presentation. Despite thought that SIADH is most likely due to Depakote use, patient has had come positive cancer screenings that need followed up in the outpatient setting, as below.  - BID BMP  - Continue 1g NaCl tabs TID  - 1.5 L fluid restriction  - Outpatient follow-up for prior history of positive cologard but no colonoscopy; prostate with calcifications on CT scan (PSA WNL 7/29); and CT chest with RUL nodules (recommended yearly scans)  -After achieving stable Depakote and Lamictal doses as above, recheck sodium level as outpatient via PCP to see if this is improved     #Paroxsymal Afib:   - Continue Diltiazem 360mg  q24hr  - Monitor on telemetry  - Discuss anticoagulation in outpatient setting     #Bipolar Disorder: Follows with [facility]. Improvement in Bipolar symptoms while on Depakote. Was briefly switched to Lithium in the past but could not tolerate due to side effects. Per chart review, control of mood symptoms is high priority to patient and family. Now that he warrants treatment for suspected epilepsy, it will be important to determine if mood disorder and seizures can be treated with the same medication or if he requires 2 medications to treat the 2 conditions. Unfortunately due to SIADH as above,  titration of Depakote is not possible.   - Psych following; appreciate assistance, paged in regards to new plan regarding Depakote decrease  - Plan to titrate to a combination of Depakote and Lamictal as delineated above  - Continue Zyprexa 20mg  nightly   - Zyprexa 2.5 BID PRN for anxiety and insomnia     #Alcohol Use Disorder: Pt reports chronic alcohol use history. Has been sober over past 6 weeks so lower concern for withdrawal symptoms. Patient has not seized in the ED but did exhibit some bilateral UE tremulousness on the morning of 7/29. Monitored on CIWA.  - Continue Gabapentin 300mg  BID (to help with withdrawal symptoms, chronic pain and anxiety)  - CIWA scoring (no Ativan ordered)  - Daily Thiamine     #Tobacco Use Disorder:  - Nicotine patch     # Discharge Planning:   - Case management: consulted. Recommendations appreciated.  - Social work: pending.  - PT: 5xL  - OT: 5xL  - SLP: N/A  - PM&R: N/A  - Expected Discharge Disposition: TBD.    - Will need Neurology follow-up    # Checklist:  - Diet: Regular diet and 1.5L fluid restriction  - IV fluids: no  - Bowel Regimen: Miralax 17 g daily  - GI PPX: No GI indications  - DVT PPX: Lovenox 40 mg [place] daily  - Lines/Access: None.    - Foley: No    Please page the Neurology Team B Resident at [phone] for any questions/concerns.    -------------------------------  Jolene Provost MD  [facility] Physical Medicine and Rehabilitation PGY-1  Neurology Team B  -------------------------------       ATTESTATION NOTE:  I saw and evaluated the patient, participating in the key portions of the service.  I reviewed the resident???s note and agree with the resident???s findings and plan as documented in their note.  Tawanna Cooler, MD      Overnight Events & Subjective:     - Working on slow Lamictal titration; increased to 25 mg daily 8/1  - EEG discontinued 7/29  - Na 136 this morning  - PT/OT 5x low; patient willing to stay until Monday for rehab planning with CM, pt also has 20 hours of private care     Physical Exam:     General Exam:  General Appearance: Well appearing. In no acute distress.  HEENT: Head is atraumatic and normocephalic. Sclera anicteric without injection. Oropharyngeal membranes are moist with no erythema or exudate.  Neck: Supple.  Lungs: Normal work of breathing.   Heart: warm and well perfused  Abdomen: Soft, nontender, nondistended.  Extremities: No clubbing, cyanosis, or edema.    Neurological Exam:  Mental Status: Alert, conversant, able to follow conversation and interview. Spontaneous speech was fluent without word finding pauses, dysarthria, or paraphasic errors. Comprehension was intact. Memory for recent and remote events was intact.  Cranial Nerves: Pursuit eye movements were grossly uninterrupted with full range. Face symmetric at rest. Normal facial movement bilaterally, including forehead, eye closure and grimace/smile. Hearing intact to conversation.   Motor Exam: Normal bulk.  Normal tone in the upper and lower extremities.  5/5 throughout uppers; 4+/5 in lowers  Reflexes: Deferred- DTRs are 2+ and symmetric throughout. Toes are downgoing bilaterally.  Sensory: Deferred- Sensation normal to light touch in both hands and both feet and to vibration distally in the fingers and toes.    Cerebellar/Coordination/Gait:  did not assess gait today.        Data Review:  Contact Information:  Family contact: Wife  PCP: Fabio Pierce, MD    Medications:  Scheduled medications:    dilTIAZem  360 mg Oral Q24H    divalproex ER  750 mg Oral At bedtime    enoxaparin (LOVENOX) injection  40 mg Subcutaneous Q24H    folic acid  1 mg Oral Daily    gabapentin  300 mg Oral BID    lamoTRIgine  25 mg Oral Daily    lidocaine  2 patch Transdermal Daily    melatonin  3 mg Oral QPM    nicotine  1 patch Transdermal Daily    OLANZapine  20 mg Oral Nightly    polyethylene glycol  17 g Oral Daily    sodium chloride  1 g Oral 3xd Meals    thiamine mononitrate (vit B1)  100 mg Oral Daily     Continuous infusions:   PRN medications: acetaminophen, OLANZapine, senna    24 hour vital signs:  Temp:  [36.4 ??C (97.5 ??F)-37.1 ??C (98.8 ??F)] 36.5 ??C (97.7 ??F)  Heart Rate:  [58-144] 63  Resp:  [18] 18  BP: (105-132)/(68-92) 112/68  MAP (mmHg):  [82-101] 82  SpO2:  [97 %-100 %] 99 %    Ins and Outs:  I/O this shift:  In: 580 [P.O.:580]  Out: 800 [Urine:800]    Laboratory values:  All Labs Last 24hrs:   Recent Results (from the past 24 hour(s))   Basic Metabolic Panel    Collection Time: 08/19/20  4:28 AM   Result Value Ref Range    Sodium 134 (L) 135 - 145 mmol/L    Potassium 4.4 3.4 - 4.8 mmol/L    Chloride 102 98 - 107 mmol/L    CO2 27.0 20.0 - 31.0 mmol/L    Anion Gap 5 5 - 14 mmol/L    BUN 10 9 - 23 mg/dL    Creatinine 1.61 0.96 - 1.10 mg/dL    BUN/Creatinine Ratio 15     eGFR CKD-EPI (2021) Male >90 >=60 mL/min/1.18m2    Glucose 92 70 - 179 mg/dL    Calcium 9.2 8.7 - 04.5 mg/dL   Magnesium Level    Collection Time: 08/19/20  4:28 AM   Result Value Ref Range    Magnesium 1.6 1.6 - 2.6 mg/dL   Phosphorus Level    Collection Time: 08/19/20  4:28 AM   Result Value Ref Range    Phosphorus 4.8 2.4 - 5.1 mg/dL   CBC w/ Differential    Collection Time: 08/19/20  4:28 AM   Result Value Ref Range    WBC 6.2 3.6 - 11.2 10*9/L    RBC 3.52 (L) 4.26 - 5.60 10*12/L    HGB 12.6 (L) 12.9 - 16.5 g/dL    HCT 40.9 (L) 81.1 - 48.0 %    MCV 102.6 (H) 77.6 - 95.7 fL    MCH 35.8 (H) 25.9 - 32.4 pg    MCHC 34.9 32.0 - 36.0 g/dL    RDW 91.4 78.2 - 95.6 %    MPV 7.7 6.8 - 10.7 fL    Platelet 259 150 - 450 10*9/L    Neutrophils % 41.6 %    Lymphocytes % 37.7 %    Monocytes % 13.6 %    Eosinophils % 6.6 %    Basophils % 0.5 %    Absolute Neutrophils 2.6 1.8 - 7.8 10*9/L    Absolute Lymphocytes 2.3 1.1 - 3.6 10*9/L    Absolute Monocytes 0.8 0.3 -  0.8 10*9/L    Absolute Eosinophils 0.4 0.0 - 0.5 10*9/L    Absolute Basophils 0.0 0.0 - 0.1 10*9/L    Macrocytosis Slight (A) Not Present   Basic Metabolic Panel    Collection Time: 08/19/20  1:01 PM   Result Value Ref Range    Sodium 136 135 - 145 mmol/L    Potassium 3.9 3.4 - 4.8 mmol/L    Chloride 103 98 - 107 mmol/L    CO2 26.0 20.0 - 31.0 mmol/L    Anion Gap 7 5 - 14 mmol/L    BUN 10 9 - 23 mg/dL    Creatinine 1.61 0.96 - 1.10 mg/dL    BUN/Creatinine Ratio 16     eGFR CKD-EPI (2021) Male >90 >=60 mL/min/1.52m2    Glucose 131 (H) 70 - 99 mg/dL    Calcium 9.0 8.7 - 04.5 mg/dL Imaging:  Pertinent imaging discussed in the A/P section

## 2020-08-20 NOTE — Unmapped (Signed)
Problem: Adult Inpatient Plan of Care  Goal: Plan of Care Review  Outcome: Ongoing - Unchanged  Goal: Patient-Specific Goal (Individualized)  Outcome: Ongoing - Unchanged  Flowsheets (Taken 08/19/2020 1725)  Patient-Specific Goals (Include Timeframe): Pt will not consume more than 1500 ml of fluids throughout this shift.  Individualized Care Needs: Assist when getting OOB, food tray set-up, seizure precautions, fluid restrictions  Anxieties, Fears or Concerns: none expressed  Note: Pt care assumed at the beginning of this shift and he is A&O x 4. VSS stable and pt is able to effectively express needs to staff. No c/o of pain or discomfort at this time. All scheduled meds administered and tolerated without issue. Sodium levels closely monitored throughout shift. Pt denied PIV insertion and stated he wanted oral form of magnesium oxide. Pt had good appetite throughout shift, ate over 75 % of all meals on shift. Fluid restrictions maintained. Safety precautions maintained, no acute needs noted at this time.  Goal: Absence of Hospital-Acquired Illness or Injury  Outcome: Ongoing - Unchanged  Intervention: Identify and Manage Fall Risk  Recent Flowsheet Documentation  Taken 08/19/2020 0800 by Lucious Groves, RN  Safety Interventions:  ??? fall reduction program maintained  ??? low bed  ??? lighting adjusted for tasks/safety  ??? nonskid shoes/slippers when out of bed  Intervention: Prevent Skin Injury  Recent Flowsheet Documentation  Taken 08/19/2020 0800 by Lucious Groves, RN  Skin Protection: adhesive use limited  Intervention: Prevent and Manage VTE (Venous Thromboembolism) Risk  Recent Flowsheet Documentation  Taken 08/19/2020 1600 by Lucious Groves, RN  Activity Management: activity adjusted per tolerance  Taken 08/19/2020 1400 by Lucious Groves, RN  Activity Management: activity adjusted per tolerance  Taken 08/19/2020 1200 by Lucious Groves, RN  Activity Management: activity adjusted per tolerance  Taken 08/19/2020 1000 by Lucious Groves, RN  Activity Management: activity adjusted per tolerance  Taken 08/19/2020 0800 by Lucious Groves, RN  Activity Management: activity adjusted per tolerance  VTE Prevention/Management: ambulation promoted  Intervention: Prevent Infection  Recent Flowsheet Documentation  Taken 08/19/2020 0800 by Lucious Groves, RN  Infection Prevention:  ??? environmental surveillance performed  ??? equipment surfaces disinfected  ??? hand hygiene promoted  ??? personal protective equipment utilized  ??? rest/sleep promoted  ??? single patient room provided  Goal: Optimal Comfort and Wellbeing  Outcome: Ongoing - Unchanged  Goal: Readiness for Transition of Care  Outcome: Ongoing - Unchanged  Goal: Rounds/Family Conference  Outcome: Ongoing - Unchanged     Problem: Seizure Disorder Comorbidity  Goal: Maintenance of Seizure Control  Outcome: Ongoing - Unchanged  Intervention: Maintain Seizure-Symptom Control  Recent Flowsheet Documentation  Taken 08/19/2020 0800 by Lucious Groves, RN  Seizure Precautions: activity supervised     Problem: Fall Injury Risk  Goal: Absence of Fall and Fall-Related Injury  Outcome: Ongoing - Unchanged  Intervention: Promote Injury-Free Environment  Recent Flowsheet Documentation  Taken 08/19/2020 0800 by Lucious Groves, RN  Safety Interventions:  ??? fall reduction program maintained  ??? low bed  ??? lighting adjusted for tasks/safety  ??? nonskid shoes/slippers when out of bed     Problem: Self-Care Deficit  Goal: Improved Ability to Complete Activities of Daily Living  Outcome: Ongoing - Unchanged

## 2020-08-21 LAB — BASIC METABOLIC PANEL
ANION GAP: 5 mmol/L (ref 5–14)
BLOOD UREA NITROGEN: 9 mg/dL (ref 9–23)
BUN / CREAT RATIO: 14
CALCIUM: 9.2 mg/dL (ref 8.7–10.4)
CHLORIDE: 100 mmol/L (ref 98–107)
CO2: 26 mmol/L (ref 20.0–31.0)
CREATININE: 0.64 mg/dL
EGFR CKD-EPI (2021) MALE: >90 mL/min/{1.73_m2} (ref >=60)
GLUCOSE RANDOM: 86 mg/dL (ref 70–179)
POTASSIUM: 4.3 mmol/L (ref 3.4–4.8)
SODIUM: 131 mmol/L — ABNORMAL LOW (ref 135–145)

## 2020-08-21 LAB — CBC W/ AUTO DIFF
BASOPHILS ABSOLUTE COUNT: 0.1 10*9/L (ref 0.0–0.1)
BASOPHILS RELATIVE PERCENT: 0.9 %
EOSINOPHILS ABSOLUTE COUNT: 0.5 10*9/L (ref 0.0–0.5)
EOSINOPHILS RELATIVE PERCENT: 7.7 %
HEMATOCRIT: 37 % — ABNORMAL LOW (ref 39.0–48.0)
HEMOGLOBIN: 12.9 g/dL (ref 12.9–16.5)
LYMPHOCYTES ABSOLUTE COUNT: 2.2 10*9/L (ref 1.1–3.6)
LYMPHOCYTES RELATIVE PERCENT: 37.6 %
MEAN CORPUSCULAR HEMOGLOBIN CONC: 34.9 g/dL (ref 32.0–36.0)
MEAN CORPUSCULAR HEMOGLOBIN: 36.1 pg — ABNORMAL HIGH (ref 25.9–32.4)
MEAN CORPUSCULAR VOLUME: 103.4 fL — ABNORMAL HIGH (ref 77.6–95.7)
MEAN PLATELET VOLUME: 7.7 fL (ref 6.8–10.7)
MONOCYTES ABSOLUTE COUNT: 0.7 10*9/L (ref 0.3–0.8)
MONOCYTES RELATIVE PERCENT: 12.6 %
NEUTROPHILS ABSOLUTE COUNT: 2.4 10*9/L (ref 1.8–7.8)
NEUTROPHILS RELATIVE PERCENT: 41.2 %
PLATELET COUNT: 261 10*9/L (ref 150–450)
RED BLOOD CELL COUNT: 3.58 10*12/L — ABNORMAL LOW (ref 4.26–5.60)
RED CELL DISTRIBUTION WIDTH: 13.9 % (ref 12.2–15.2)
WBC ADJUSTED: 5.9 10*9/L (ref 3.6–11.2)

## 2020-08-21 LAB — PHOSPHORUS: PHOSPHORUS: 4.6 mg/dL (ref 2.4–5.1)

## 2020-08-21 LAB — MAGNESIUM: MAGNESIUM: 1.6 mg/dL (ref 1.6–2.6)

## 2020-08-21 MED ORDER — DIVALPROEX ER 250 MG TABLET,EXTENDED RELEASE 24 HR
ORAL_TABLET | ORAL | 1 refills | 71.00000 days | Status: CP
Start: 2020-08-21 — End: 2020-10-31

## 2020-08-21 MED ORDER — OLANZAPINE 20 MG TABLET
ORAL_TABLET | Freq: Every evening | ORAL | 1 refills | 30.00000 days | Status: CP
Start: 2020-08-21 — End: 2020-09-20

## 2020-08-21 MED ORDER — DILTIAZEM ER 360 MG CAPSULE,24 HR,EXTENDED RELEASE
ORAL_CAPSULE | ORAL | 1 refills | 30.00000 days | Status: CP
Start: 2020-08-21 — End: ?
  Filled 2020-09-11: qty 30, 30d supply, fill #0

## 2020-08-21 MED ADMIN — nicotine (NICODERM CQ) 21 mg/24 hr patch 1 patch: 1 | TRANSDERMAL | @ 13:00:00 | Stop: 2020-08-21

## 2020-08-21 MED ADMIN — lidocaine (LIDODERM) 5 % patch 2 patch: 2 | TRANSDERMAL | @ 13:00:00 | Stop: 2020-08-21

## 2020-08-21 MED ADMIN — lamoTRIgine (LaMICtal) tablet 25 mg: 25 mg | ORAL | @ 13:00:00 | Stop: 2020-08-21

## 2020-08-21 MED ADMIN — folic acid (FOLVITE) tablet 1 mg: 1 mg | ORAL | @ 13:00:00 | Stop: 2020-08-21

## 2020-08-21 MED ADMIN — polyethylene glycol (MIRALAX) packet 17 g: 17 g | ORAL | @ 13:00:00 | Stop: 2020-08-21

## 2020-08-21 MED ADMIN — sodium chloride tablet 1 g: 1 g | ORAL | @ 16:00:00 | Stop: 2020-08-21

## 2020-08-21 MED ADMIN — melatonin tablet 3 mg: 3 mg | ORAL | @ 01:00:00

## 2020-08-21 MED ADMIN — thiamine mononitrate (vit B1) tablet 100 mg: 100 mg | ORAL | @ 13:00:00 | Stop: 2020-08-21

## 2020-08-21 MED ADMIN — gabapentin (NEURONTIN) capsule 300 mg: 300 mg | ORAL | @ 01:00:00

## 2020-08-21 MED ADMIN — gabapentin (NEURONTIN) capsule 300 mg: 300 mg | ORAL | @ 13:00:00 | Stop: 2020-08-21

## 2020-08-21 MED ADMIN — dilTIAZem (CARDIZEM CD) 24 hr capsule 360 mg: 360 mg | ORAL | @ 13:00:00 | Stop: 2020-08-21

## 2020-08-21 MED ADMIN — sodium chloride tablet 1 g: 1 g | ORAL | @ 13:00:00 | Stop: 2020-08-21

## 2020-08-21 MED ADMIN — sodium chloride tablet 1 g: 1 g | ORAL | @ 22:00:00 | Stop: 2020-08-21

## 2020-08-21 MED ADMIN — enoxaparin (LOVENOX) syringe 40 mg: 40 mg | SUBCUTANEOUS | @ 13:00:00 | Stop: 2020-08-21

## 2020-08-21 MED ADMIN — OLANZapine (ZyPREXA) tablet 20 mg: 20 mg | ORAL | @ 01:00:00

## 2020-08-21 MED ADMIN — divalproex ER (DEPAKOTE ER) extended release 24 hr tablet 750 mg: 750 mg | ORAL | @ 01:00:00

## 2020-08-21 MED ADMIN — magnesium oxide (MAG-OX) tablet 400 mg: 400 mg | ORAL | @ 13:00:00 | Stop: 2020-08-21

## 2020-08-21 NOTE — Unmapped (Signed)
Follow-up Appointment Request  Provider: any resident  Reason/Diagnosis: epilepsy  Priority level: Priority 2

## 2020-08-21 NOTE — Unmapped (Signed)
Neuro status unchanged. Vitals stable. No c/o pain. Up contact guard. Up to chair for lunch and majority of afternoon. Able to reposition self. Call light in reach, will monitor.    Problem: Adult Inpatient Plan of Care  Goal: Plan of Care Review  Outcome: Progressing  Goal: Patient-Specific Goal (Individualized)  Outcome: Progressing  Flowsheets (Taken 08/21/2020 1600)  Patient-Specific Goals (Include Timeframe): Pt will d/c on 8/3  Goal: Absence of Hospital-Acquired Illness or Injury  Outcome: Progressing  Intervention: Identify and Manage Fall Risk  Recent Flowsheet Documentation  Taken 08/21/2020 0800 by Donalynn Furlong, RN  Safety Interventions:   bed alarm   fall reduction program maintained   low bed  Intervention: Prevent and Manage VTE (Venous Thromboembolism) Risk  Recent Flowsheet Documentation  Taken 08/21/2020 0800 by Donalynn Furlong, RN  Activity Management: activity adjusted per tolerance  Goal: Optimal Comfort and Wellbeing  Outcome: Progressing  Goal: Readiness for Transition of Care  Outcome: Progressing  Goal: Rounds/Family Conference  Outcome: Progressing     Problem: Seizure Disorder Comorbidity  Goal: Maintenance of Seizure Control  Outcome: Progressing     Problem: Fall Injury Risk  Goal: Absence of Fall and Fall-Related Injury  Outcome: Progressing  Intervention: Promote Injury-Free Environment  Recent Flowsheet Documentation  Taken 08/21/2020 0800 by Donalynn Furlong, RN  Safety Interventions:   bed alarm   fall reduction program maintained   low bed     Problem: Self-Care Deficit  Goal: Improved Ability to Complete Activities of Daily Living  Outcome: Progressing

## 2020-08-21 NOTE — Unmapped (Signed)
Home Health Agency Info:  [facility]  [phone]  Services: Physical/Occupational Therapy

## 2020-08-21 NOTE — Unmapped (Addendum)
VS: Stable this shift.    Neuro: Alert, oriented x4, follows commands. PERRLA. RUE 5, LUE 5, RLE 4, LLE 4, full sensation. No neuro changes.    Precautions in place: falls    No falls or signs of new skin breakdown this shift. No complaints of pain. Orders for no PIV. SCDs refused by pt., verbalized understanding. Pt. Tolerating regular diet well. Educated on fluid restriction. Unable to adequately document I&O's due to invalid documenting on day shift. Voiding, no BM this shift. CIWA completed q4. Pt in bed, low and locked position, side rails up, bed alarm on, call bell within reach. No outstanding needs expressed at this time.      Problem: Adult Inpatient Plan of Care  Goal: Plan of Care Review  Outcome: Progressing  Goal: Patient-Specific Goal (Individualized)  Outcome: Progressing  Flowsheets (Taken 08/21/2020 0435)  Patient-Specific Goals (Include Timeframe): Pt. will verbalize understanding of fluid restriction by 8/4  Goal: Absence of Hospital-Acquired Illness or Injury  Outcome: Progressing  Intervention: Identify and Manage Fall Risk  Recent Flowsheet Documentation  Taken 08/20/2020 2000 by Wende Bushy, RN  Safety Interventions:  ??? bed alarm  ??? fall reduction program maintained  ??? lighting adjusted for tasks/safety  ??? low bed  Goal: Optimal Comfort and Wellbeing  Outcome: Progressing  Goal: Readiness for Transition of Care  Outcome: Progressing  Goal: Rounds/Family Conference  Outcome: Progressing     Problem: Seizure Disorder Comorbidity  Goal: Maintenance of Seizure Control  Outcome: Progressing     Problem: Fall Injury Risk  Goal: Absence of Fall and Fall-Related Injury  Outcome: Progressing  Intervention: Promote Injury-Free Environment  Recent Flowsheet Documentation  Taken 08/20/2020 2000 by Wende Bushy, RN  Safety Interventions:  ??? bed alarm  ??? fall reduction program maintained  ??? lighting adjusted for tasks/safety  ??? low bed     Problem: Self-Care Deficit  Goal: Improved Ability to Complete Activities of Daily Living  Outcome: Progressing

## 2020-08-21 NOTE — Unmapped (Cosign Needed)
[facility]  Follow-Up Psychiatry Consult Note     Service Date: August 21, 2020  LOS:  LOS: 5 days      Assessment:   [facility] is a 55 y.o. male with pertinent medical and psychiatric diagnoses of TBI, HTN, afib, bipolar I disorder, alcohol use disorder w/hx of complicated withdrawal, seizures, hyponatremia, and psoriasi admitted 08/15/2020  8:35 PM for seizures.  Patient was seen in consultation by Psychiatry at the request of Nicholas Heinrich, MD with Neurology ([facility]) for evaluation of Medication recommendations for underlying mood disorder.     The patient's current presentation of low mood, feelings of worthlessness, apathy, fatigue, and poor appetite, and history of prior manic episodes, is most consistent with a depressive episode in the setting of bipolar I disorder. The patient also has a history of comorbid alcohol use disorder and alcohol withdrawal seizures, though the patient and his spouse report abstinence from alcohol since last hospitalization several weeks ago and the patient does not currently appear to be demonstrating any signs of alcohol withdrawal. However, given history of complicated withdrawal would have low threshold to treat with benzodiazepines if he does begin demonstrating vital sign changes or other physical exam findings consistent with alcohol withdrawal.   ??  Regarding the patient's medication regimen for bipolar I disorder and seizures concerning for epilepsy, this patient has a complex recent psychiatric history and mood stabilizer options have been limited by chronic hyponatremia, history of lithium toxicity, and psychiatric decompensation when on antipsychotic monotherapy. Given inability to titrate Depakote further due to recurrent hyponatremia and insufficient control of seizures on current dose, agree with Neurology team that trial of Lamictal is a reasonable next step. Recommend that the patient remain on Depakote until he reaches a therapeutic dose of Lamictal, and also continue on Zyprexa for additional mood stabilization. Will sign off unless needed further.    Please see below for detailed recommendations.    Diagnoses:   Active Hospital problems:  Active Problems:    Bipolar 1 disorder (CMS-HCC)    Alcohol use disorder, severe, dependence (CMS-HCC)       Problems edited/added by me:  No problems updated.    Safety Risk Assessment:  A full risk assessment was previously performed on 7/29. At this time, patient remains at low risk of suicide/ harm to self and at low risk of harm to others.     Recommendations:   ## Safety and Observation Level:   - Based on my clinical evaluation, I estimate the patient to be at low risk for suicide in the current setting  - At this time, we recommend routine level of observation on medical unit. This decision is based on my review of the chart including patient's history and current presentation, interview of the patient, mental status examination, and consideration of suicide risk including evaluating suicidal ideation, plan, intent, suicidal or self-harm behaviors, risk factors, and protective factors. This judgment is based on our ability to directly address suicide risk, implement suicide prevention strategies and develop a safety plan while the patient is in the clinical setting. Please contact our team if there is a concern that risk level has changed.    ## Medications:   -- Continue Lamotrigine titration; dosing per Neurology team (recommend utilizing manufacturer's labeling for dose titration with VPA, to reduce risk of SJS)  -- Continue Depakote ER 750 mg PO at bedtime while lamotrigine is titrated to therapeutic dose  -- Continue Zyprexa 20 mg PO at bedtime    --  Continue Zyprexa 2.5 mg PO BID PRN anxiety, insomnia   -- Continue Thiamine PO 100 mg daily  -- Continue Folate supplementation and multivitamin daily  -- Continue Melatonin 3 mg q1800 for sleep and circadian rhythm regulation    ## Medical Decision Making Capacity: -- A formal capacity assessement was not performed as a part of this evaluation.  If specific capacity questions arise, please contact our team as below.     ## Further Work-up:   -- Continue to work-up and treat possible medical conditions that may be contributing to current presentation.   -- While the patient is receiving medications (such as Zyprexa) that may prolong QTc and increase risk for torsades:     - MONITOR and KEEP Mg>2 and K>4      - MONITOR QTc regularly.  If QTc on tele strip >439ms, obtain 12-lead EKG.    ## Disposition:   -- There are no psychiatric contraindications to discharging this patient when medically appropriate.    ## Behavioral / Environmental:   -- Please continue Delirium (prevention) protocol detailed in initial consult note.    Thank you for this consult request. Recommendations have been communicated to the primary team.  We will sign off at this time. Please page [phone] for any questions or concerns.     This patient was evaluated in person.    Discussed with and seen by Attending, Nicholas Drilling, MD, who agrees with the assessment and plan.    [facility]    I was present with the medical student for the service. I personally verified the history of present illness, was present for the interview and medical decision making. I have verified all of the medical student???s documentation for this encounter.   -Nicholas Culver, DO-        Interval History:     Updates to hospital course since last seen by psychiatry:   - Increased Lamictal titration to 25mg  daily on 8/1  - NO PRN Zyprexa use    Patient Interview:  All right. Does not know if he is going home today vs discharge to PT facility. Mood fine. Says he was supposed to go home yesterday and his wife took off work to do this. Asks appropriate questions about discharge medication, seizures, and bipolar disorder. Denies side effects from lamictal. Feels ready for discharge. Lives in [place].     ROS:   All systems reviewed as negative/unremarkable aside from the what is documented in HPI.    Collateral:   - Reviewed medical records in Epic    Relevant Updates to past psychiatric, medical/surgical, family, or social history: no new updates    Current Medications:  Scheduled Meds:  ??? dilTIAZem  360 mg Oral Q24H   ??? divalproex ER  750 mg Oral At bedtime   ??? enoxaparin (LOVENOX) injection  40 mg Subcutaneous Q24H   ??? folic acid  1 mg Oral Daily   ??? gabapentin  300 mg Oral BID   ??? lamoTRIgine  25 mg Oral Daily   ??? lidocaine  2 patch Transdermal Daily   ??? magnesium oxide  400 mg Oral Once   ??? melatonin  3 mg Oral QPM   ??? nicotine  1 patch Transdermal Daily   ??? OLANZapine  20 mg Oral Nightly   ??? polyethylene glycol  17 g Oral Daily   ??? sodium chloride  1 g Oral 3xd Meals   ??? thiamine mononitrate (vit B1)  100 mg Oral Daily  Continuous Infusions:  PRN Meds:.acetaminophen, OLANZapine, senna      Objective:   Vital signs:   Temp:  [36.6 ??C (97.9 ??F)-36.9 ??C (98.4 ??F)] 36.9 ??C (98.4 ??F)  Heart Rate:  [65-81] 69  Resp:  [17-18] 18  BP: (107-141)/(68-102) 107/68  MAP (mmHg):  [81-114] 81  SpO2:  [97 %-100 %] 97 %    Physical Exam:  Gen: No acute distress.  Pulm: Normal work of breathing.  Neuro/MSK: Normal bulk/tone. Gait/station deferred given pt lying in bed..  Skin: warm and normal skin tone.    Mental Status Exam:  Appearance:  appears stated age and lying in bed   Attitude:   calm and cooperative   Behavior/Psychomotor:  appropriate eye contact and no abnormal movements   Speech/Language:   normal rate, volume, tone, fluency   Mood:  ???Fine.???   Affect:  euthymic   Thought process:  logical, linear, clear, coherent, goal directed   Thought content:    denies thoughts of self-harm. Denies SI, plans, or intent. Denies HI.  No grandiose, self-referential, persecutory, or paranoid delusions noted.   Perceptual disturbances:   denies auditory and visual hallucinations   Attention:  able to fully attend without fluctuations in consciousness   Concentration:  Able to fully concentrate and attend   Orientation:  grossly oriented.   Memory:  not formally tested, but grossly intact   Fund of knowledge:   not formally assessed   Insight:    Fair   Judgment:   Fair   Impulse Control:  Fair       Data Reviewed:  I reviewed labs from the last 24 hours.     Additional Psychometric Testing:  Not applicable.

## 2020-08-21 NOTE — Unmapped (Signed)
NMB (Neurology Team B)  Physician Discharge Summary        Admit date: 08/15/2020    Discharge date: 08/21/2020    Discharge to: Home with Home Health    Discharge Service: NEU Neurology Team B (NMB)    Discharge Physician: Michell Heinrich, MD    Discharge Diagnoses:   Primary discharge diagnosis: Sizures (POA)    Principal Problem:    Seizure (CMS-HCC)  Active Problems:    Bipolar 1 disorder (CMS-HCC)    Alcohol use disorder, severe, dependence (CMS-HCC)    Hyponatremia       Follow-up Issues:   - Given synergistic interaction of Depakote and Lamictal, initiated Lamictal at 25 mg daily for 2 weeks (7/31-8/13). After 2 weeks will increase Lamictal to 50 daily and decrease Depakote from 750 mg to 500 mg daily. Will need Lamictal level checked at this time.  - Monitor sodium as Depakote is reduced   - Will need neurology follow-up for new diagnosis of epilepsy  - Will need psych follow-up to monitor symptom control with medication changes  - Discuss AC for A Fib with outpatient primary care  - Discuss cancer screenings with PCP (hx positive cologard but no colonoscopy; calcifications of prostate on CT; RUL lung nodules on CT)    Procedures: none    Consults: none    Pertinent Test Results:     All Labs Last 24hrs:   Recent Results (from the past 24 hour(s))   Basic Metabolic Panel    Collection Time: 08/21/20  5:50 AM   Result Value Ref Range    Sodium 131 (L) 135 - 145 mmol/L    Potassium 4.3 3.4 - 4.8 mmol/L    Chloride 100 98 - 107 mmol/L    CO2 26.0 20.0 - 31.0 mmol/L    Anion Gap 5 5 - 14 mmol/L    BUN 9 9 - 23 mg/dL    Creatinine 1.61 0.96 - 1.10 mg/dL    BUN/Creatinine Ratio 14     eGFR CKD-EPI (2021) Male >90 >=60 mL/min/1.61m2    Glucose 86 70 - 179 mg/dL    Calcium 9.2 8.7 - 04.5 mg/dL   Magnesium Level    Collection Time: 08/21/20  5:50 AM   Result Value Ref Range    Magnesium 1.6 1.6 - 2.6 mg/dL   Phosphorus Level    Collection Time: 08/21/20  5:50 AM   Result Value Ref Range    Phosphorus 4.6 2.4 - 5.1 mg/dL   CBC w/ Differential    Collection Time: 08/21/20  5:50 AM   Result Value Ref Range    WBC 5.9 3.6 - 11.2 10*9/L    RBC 3.58 (L) 4.26 - 5.60 10*12/L    HGB 12.9 12.9 - 16.5 g/dL    HCT 40.9 (L) 81.1 - 48.0 %    MCV 103.4 (H) 77.6 - 95.7 fL    MCH 36.1 (H) 25.9 - 32.4 pg    MCHC 34.9 32.0 - 36.0 g/dL    RDW 91.4 78.2 - 95.6 %    MPV 7.7 6.8 - 10.7 fL    Platelet 261 150 - 450 10*9/L    Neutrophils % 41.2 %    Lymphocytes % 37.6 %    Monocytes % 12.6 %    Eosinophils % 7.7 %    Basophils % 0.9 %    Absolute Neutrophils 2.4 1.8 - 7.8 10*9/L    Absolute Lymphocytes 2.2 1.1 - 3.6 10*9/L  Absolute Monocytes 0.7 0.3 - 0.8 10*9/L    Absolute Eosinophils 0.5 0.0 - 0.5 10*9/L    Absolute Basophils 0.1 0.0 - 0.1 10*9/L    Macrocytosis Slight (A) Not Present          Pending Test Results:   None      Hospital Course:   Outpatient Follow-Up:  - Given synergistic interaction of Depakote and Lamictal, initiated Lamictal at 25 mg daily for 2 weeks (7/31-8/13). After 2 weeks will increase Lamictal to 25 mg BID and decrease Depakote from 750 mg to 500 mg daily. Will need Lamictal level checked at this time.  - Monitor sodium as Depakote is reduced   - Will need neurology follow-up for new diagnosis of epilepsy  - Will need psych follow-up to monitor symptom control with medication changes  - Discuss AC for A Fib with outpatient primary care  - Discuss cancer screenings with PCP (hx positive cologard but no colonoscopy; calcifications of prostate on CT; RUL lung nodules on CT)    Nicholas Rush is a 55 y.o. male with a past medical history of HTN, Paroxysmal Afib not on anticoagulation, Bipolar I Disorder, Depakote-Induced SIADH, Anxiety, Depression, Alcohol Use disorder c/b withdrawal seizures, tobacco use, Psoriatic arthritis on Humira, who was admitted to [facility] on 08/15/2020 for seizure acitivity.     Epilepsy and alcohol withdrawal seizures  Pt has long history of alcohol withdrawal seizures and seizures in setting of hyponatremia (Na to 111). Presented to ED after 5 minute seizure with 20 minutes of post-ictal confusion prior to returning to baseline on 7/28. Unlikely that seizure is secondary to alcohol withdrawal given sobriety for 6 weeks with ethanol level wnl. Hyponatremia could potentially be lowering seizure threshold but level is within typical range on presentation. MRI and routine EEG in the past have been without seizure focus. Pt does not have obvious provoking factor at this time and history is concerning for potential epilepsy. He has been on Depakote, as below, for bipolar disorder but not at a therapeutic level for seizure control. At this point, treatment with an AED to therapeutic levels is warranted. Per discussion with psychiatry, initiated Lamictal inpatient with plans to titrate outpatient. He remains on Depakote and will likely continue this even as Lamictal becomes therapeutic, as using Depakote and Lamictal together will reduce the dose needed of each and patient states he has had good symptom control (Bipolar) with Depakote.       Hyponatremia 2/2 Drug-Induced SIADH  Has been thoroughly worked up in the past and based on urine studies diagnosed as SIADH most likely secondary to Depakote. Risk/benefit of SIADH (relatively well controlled with fluid restriction and salt tabs at home) and Depakote treatment (has been the most effective for bipolar as below) was discussed with patient and wife previously and decision was made to continue Depakote for mood symptoms. Sodium is 127 (within normal baseline for patient) on presentation. Despite thought that SIADH is most likely due to Depakote use, patient has had some positive cancer screenings that need followed up in the outpatient setting (history of positive cologard with no colonoscopy; prostate with calcifications on CT scan although PSA WNL, CT chest with RUL nodules; yearly scans recommended). Patient continued on 1g NaCl tabs TID. Will need sodium levels monitored by outpatient PCP as Lamictal and Depakote doses are adjusted.     Paroxsymal Afib  Not on anticoagulation. This is something that should be discussed in the outpatient setting. Risks and benefits, including  stroke risk reduction, of anticoagulation should be weighed for the patient. He was continued on his home Diltiazem 360mg  daily. Telemetry monitoring while inpatient mostly showed NSR, and NSR w/ PACs on 7/31     Bipolar Disorder  Follows with [facility]. Improvement in Bipolar symptoms while on Depakote. Was briefly switched to Lithium in the past but could not tolerate due to side effects/toxicity. Per chart review, control of mood symptoms is high priority to patient and family. Now that he warrants treatment for suspected epilepsy, it will be important to determine if mood disorder and seizures can be treated with the same medication or if he requires 2 medications to treat the 2 conditions. Unfortunately due to SIADH as above, further titration of Depakote is not possible. Plan to titrate to combination of Depakote and Lamictal, as above. He was maintained on his home Zyprexa.      Alcohol Use Disorder  Pt reports chronic alcohol use history. Has been sober over past 6 weeks so lower concern for withdrawal symptoms. Patient did not seize in the ED but did exhibit some bilateral UE tremulousness on the morning of 7/29. Monitored on CIWA with scores of 0-1. He was maintained on his home Gabapentin and Thiamine.      Tobacco Use Disorder  - Nicotine patch        Condition at Discharge: good    Discharge Exam:   General Exam:  General Appearance: Well appearing. In no acute distress.  HEENT: Head is atraumatic and normocephalic. Sclera anicteric without injection. Oropharyngeal membranes are moist with no erythema or exudate.  Neck: Supple.  Lungs: Normal work of breathing.   Heart: warm and well perfused  Abdomen: Soft, nontender, nondistended.  Extremities: No clubbing, cyanosis, or edema.     Neurological Exam:  Mental Status: Alert, conversant, able to follow conversation and interview. Spontaneous speech was fluent without word finding pauses, dysarthria, or paraphasic errors. Comprehension was intact. Memory for recent and remote events was intact.  Cranial Nerves: Pursuit eye movements were grossly uninterrupted with full range. Face symmetric at rest. Normal facial movement bilaterally, including forehead, eye closure and grimace/smile. Hearing intact to conversation.   Motor Exam: Normal bulk.  Normal tone in the upper and lower extremities.  5/5 throughout uppers; 4+/5 in lowers  Reflexes: Deferred- DTRs are 2+ and symmetric throughout. Toes are downgoing bilaterally.  Sensory: Deferred- Sensation normal to light touch in both hands and both feet and to vibration distally in the fingers and toes.    Cerebellar/Coordination/Gait:  did not assess gait today.    Discharge Medications:     Your Medication List        START taking these medications      lamoTRIgine 25 MG tablet  Commonly known as: LaMICtal  Take 1 tablet (25 mg total) by mouth daily for 10 days, THEN 2 tablets (50 mg total) daily.  Start taking on: August 22, 2020            CHANGE how you take these medications      divalproex ER 250 MG extended release 24 hr tablet  Commonly known as: DEPAKOTE ER  Take 3 tablets (750 mg total) by mouth nightly for 11 days, THEN 2 tablets (500 mg total) nightly.  Start taking on: August 21, 2020  What changed: See the new instructions.            CONTINUE taking these medications      acetaminophen 500 MG tablet  Commonly  known as: TYLENOL  Take 500 mg by mouth every six (6) hours as needed for pain.     adalimumab 40 mg/0.4 mL injection  Commonly known as: HUMIRA  Inject the contents of 1 syringe (40 mg) under the skin every 14 days     calcium-vitamin D 500 mg-5 mcg (200 unit) per tablet  Commonly known as: calcium-vitamin D  Take 1 tablet by mouth Three (3) times a day with a meal. cetirizine 10 MG tablet  Commonly known as: ZyrTEC  Take 1 tablet (10 mg total) by mouth in the morning.     diltiazem 360 MG 24 hr capsule  Commonly known as: TIAZAC  Take 1 capsule (360 mg total) by mouth daily.  NOTE: This replaces amlodipine.     docusate sodium 100 MG capsule  Commonly known as: COLACE  Take 1 capsule (100 mg total) by mouth in the morning.     fluticasone propionate 50 mcg/actuation nasal spray  Commonly known as: FLONASE  Use 2 sprays in each nostril in the morning.     folic acid 1 MG tablet  Commonly known as: FOLVITE  Take 1 tablet (1 mg total) by mouth in the morning.     gabapentin 300 MG capsule  Commonly known as: NEURONTIN  Take 1 capsule (300 mg total) by mouth Two (2) times a day.     HEALTHYLAX 17 gram packet  Generic drug: polyethylene glycol  Mix contents of 1 packet (17 grams) in 4 to 8 ounces of liquid and drink daily.     loperamide 2 mg capsule  Commonly known as: IMODIUM  Take 2 capsules (4 mg total) by mouth every four (4) hours as needed. Do not exceed 8 capsules (16mg ) per day     magnesium oxide 400 mg (241.3 mg elemental) tablet  Commonly known as: MAG-OX  Take 2 tablets (800 mg total) by mouth Two (2) times a day.     melatonin 3 mg Tab  Take 1 tablet (3 mg total) by mouth every evening.     naltrexone 50 mg tablet  Commonly known as: DEPADE  Take 1 tablet (50 mg total) by mouth in the morning.     nicotine 21 mg/24 hr patch  Commonly known as: NICODERM CQ  Place 1 patch on the skin in the morning. Remove old patch before applying new one .     OLANZapine 20 MG tablet  Commonly known as: ZyPREXA  Take 1 tablet (20 mg total) by mouth nightly.     SENNA 8.6 mg tablet  Generic drug: senna  Take 2 tablets by mouth nightly as needed for constipation.     simethicone 80 MG chewable tablet  Commonly known as: MYLICON  Chew 1 tablet (80 mg total) Three (3) times a day as needed.     sodium chloride 1 gram tablet  Take 1 tablet (1 g total) by mouth Three (3) times a day with a meal.     THERA-M Tab  Generic drug: multivitamin,tx-iron-minerals  Take 1 tablet by mouth daily.     thiamine mononitrate (vit B1) 100 mg Tab tablet  Take 1 tablet (100 mg total) by mouth in the morning.              Discharge Instructions:   Activity Instructions       Activity as tolerated            Other Instructions       Call MD for:  difficulty breathing, headache or visual disturbances      Call MD for:  persistent nausea or vomiting      Call MD for:  severe uncontrolled pain      Call MD for:  temperature >38.5 Celsius      Discharge instructions      You were admitted to [facility] with seizures. You have had seizures in the past due to alcohol withdrawal and also due to low sodium. You have not been drinking the last few weeks and your sodium levels were in the range that seems normal for you so we do not think either of these things caused your most recent seizure. Sometimes people have provoked seizures (caused by something that can be reversed/treated such as alcohol withdrawal, lab abnormalities like low sodium, or infection) and sometimes people have unprovoked seizures and we don't necessarily know why. You have had these types of seizures before. We think that you may also have unprovoked seizures, or epilepsy, which means that you would remain at a higher risk of having seizures in the future. Because of this, you will need to be on medications that are used to reduce your risk of continued seizures. Depakote is a seizure medication (also used for Bipolar disorder, as you have been taking it) but your blood levels of Depakote aren't high enough to be effective for seizures and we can't increase it any more because of your low sodium. We added another medication for you, called Lamictal (also called Lamotrigine), to help control seizures. Depakote and Lithium interact with each other (and affect your sodium) so it's very important that you take these medicines as directed and get your blood work checked as you change the doses.     - For 2 weeks, you will take your normal dose of Depakote (750 mg nightly) plus 25 mg Lamictal once a day. You started this in the hospital on 7/31 so you will continue in this way until 8/13.       - Starting on 8/14, you will reduce your nighttime Depakote dose to 500 mg and then increase your Lamictal dose to 50 mg daily. Do not increase your Lamictal any further after this point until you have seen [place] in clinic.     - You will need to have your Lamictal level checked and your sodium checked the week of 8/15. You can have these drawn at your PCP or we can order the tests to be drawn at any lab that you prefer.      We have requested an appointment for you with [facility]. Someone will call you to schedule this appointment and it's very important that you follow-up in clinic so we can look at your blood tests, see how you are doing on the medicines, and see if further medication adjustments are needed. If you do not hear from the clinic regarding your appointment within 1 week, please call [phone] to schedule your appointment.      You should also make an appointment to see your psychiatrist within the next month so that they can monitor your Bipolar symptoms with the changes to the Depakote and the addition of Lamictal.      You were evaluated by our physical and occupational therapists here in the hospital who recommended that you either go to a nursing facility for rehab or return home with 24 hour supervision/assistance. You preferred to return home, so your family will need to ensure that you have someone with  you at all times.                     How can you care for yourself at home?  ?? Be safe with medicines. Take your medicines exactly as prescribed. Call your doctor if you think you are having a problem with your medicine.  ?? Do not do any activity that could be dangerous to you or others until your doctor says it is safe to do so. For example, do not drive a car, operate machinery, swim, or climb ladders.  ?? Be sure that anyone treating you for any health problem knows that you have had a seizure and what medicines you are taking for it.  ?? Identify and avoid things that may make you more likely to have a seizure. These may include lack of sleep, alcohol or drug use, stress, or not eating.     What about driving?     You should not drive. You must report yourself to the [facility] manager Program by calling [phone].      You will be provided with a packet of paperwork regarding your medical and seizure history. Please fill out all personal information sections prior to giving this packet to your neurologist or primary care physician. An independent physician with the Department of Motor Vehicles will review your case and determine whether or not you will be allowed to drive. You should not drive until you receive a formal decision from the [facility].       Seizures may happen at any time. It is important to take certain precautions to maintain your safety.      When possible, take showers instead of baths, as it is possible to drown in even shallow water during a seizure. Do not swim unsupervised or in open water where rescue could be difficult. Do not climb to heights and do not operate heavy machinery. When cooking, use the back burners of the stove and avoid open flames or hot stove tops. Avoid any activities which could be dangerous in the event of a loss of consciousness.        Follow Up instructions and Outpatient Referrals     Ambulatory referral to Home Health      Is this a [facility] or [facility] Patient?: Yes    Home Health Options: Traditional Home Health    Is this patient at high risk for COVID 19 transmission and recommended to   stay at home during this pandemic?: Yes    If the patient has a diagnosis of heart failure and is already on oral   diuretics, do you want to activate the in home IV Lasix protocol?: No    Do you want agency provider parameter notifications or patient specific   provider parameter notifications?: Agency    Do you want to initiate remote patient monitoring?: Yes    Physician to follow patient's care: PCP    Disciplines requested:  Physical Therapy  Occupational Therapy       Physical Therapy requested:  Ambulation training  Evaluate and treat  Strengthening exercises       Occupational Therapy Requested:  Transfer training  ADL or IADL training  Evaluate and treat       Requested SOC Date:  Comment - SOC by 08/23/20    Do you want ongoing co-management?: No    Care coordination required?: No    Call MD for:  difficulty breathing, headache or visual disturbances      Call MD for:  persistent nausea or vomiting      Call MD for:  severe uncontrolled pain      Call MD for:  temperature >38.5 Celsius      Discharge instructions        Appointments which have been scheduled for you      Aug 27, 2020  2:00 PM  (Arrive by 1:45 PM)  NEW HCP TELEPHONE with Haskel Khan, [facility]  [facility] ALCOHOL AND SUBSTANCE ABUSE PROGRAM [facility] REGION) [address] [place] [address]  [phone]   Please DO NOT come to the clinic for this visit. We will call you to discuss your plan of care.          Aug 29, 2020  2:00 PM  (Arrive by 1:45 PM)  RETURN VIDEO DIRECT LINK with Rupal Doreatha Lew, MD  [facility] [place]  Arrive at: This is a Video Visit [address]  [place] [address]  [phone]   A direct link will be sent to you by your provider at the time of your video appointment. DO NOT go to the clinic.              Sep 09, 2020  9:15 AM  (Arrive by 9:00 AM)  RETURN  STEP with Augustin Coupe, MD  [facility]) [address]  [place] [address]  [phone]          Sep 09, 2020 11:00 AM  (Arrive by 10:45 AM)  RETURN VIDEO DIRECT LINK with Harl Favor, MD  [facility] DERMATOLOGY [address] [place] ([place])  Arrive at: This is a Video Visit [address]  [place] [address]  [phone]   A direct link will be sent to you by your provider at the time of your video appointment. DO NOT go to the clinic.              Oct 02, 2020  1:00 PM  (Arrive by 12:45 PM)  NEW ADULT with Nurum Joeseph Amor, MD  [facility] INTERNAL MEDICINE [address] [facility] REGION) [address]  [address]  [place] [address]  [phone]                Resources and Referrals    Home Health Agency Info:  [facility]  [phone]  Services: Physical/Occupational Therapy             I spent greater than 30 minutes in the discharge of this patient.    Jolene Provost, MD  PGY-1       ATTESTATION NOTE:  I saw and evaluated the patient, participating in the key portions of the service on the day of discharge.  I reviewed the resident's note and agree with the discharge plans and disposition. I personally spent less than 30 minutes in discharge planning services.     Tawanna Cooler, MD

## 2020-08-21 NOTE — Unmapped (Signed)
pt will call and schedule appt   Follow-up Appointment Request  Provider: any resident  Reason/Diagnosis: epilepsy  Priority level: Priority 2

## 2020-08-21 NOTE — Unmapped (Signed)
Problem: Adult Inpatient Plan of Care  Goal: Plan of Care Review  Outcome: Ongoing - Unchanged  Goal: Patient-Specific Goal (Individualized)  Outcome: Ongoing - Unchanged  Flowsheets (Taken 08/20/2020 1850)  Patient-Specific Goals (Include Timeframe): Pt will get OOB with walker during this shift  Individualized Care Needs: Assist when getting OOB, food tray set-up, seizure precautions, fluid restrictions  Anxieties, Fears or Concerns: none expressed  Note:    Pt care assumed at the beginning of this shift and he is A&O x 4. VSS stable and pt is able to effectively express needs to staff. No c/o of pain or discomfort at this time. All scheduled meds administered and tolerated without issue. Sodium levels closely monitored throughout shift. Pt denied PIV insertion and stated he wanted oral form of magnesium oxide. Pt had good appetite throughout shift, ate over 75 % of all meals on shift. Fluid restrictions maintained. Telemetry monitoring refused. Spoke with pt and wife regarding discharge, case manager contacted and made aware of pt concerns Safety precautions maintained, no acute needs noted at this time.  Goal: Absence of Hospital-Acquired Illness or Injury  Outcome: Ongoing - Unchanged  Intervention: Identify and Manage Fall Risk  Recent Flowsheet Documentation  Taken 08/20/2020 0800 by Lucious Groves, RN  Safety Interventions:   bed alarm   fall reduction program maintained  Intervention: Prevent Skin Injury  Recent Flowsheet Documentation  Taken 08/20/2020 0800 by Lucious Groves, RN  Skin Protection:   adhesive use limited   tubing/devices free from skin contact   incontinence pads utilized  Intervention: Prevent and Manage VTE (Venous Thromboembolism) Risk  Recent Flowsheet Documentation  Taken 08/20/2020 1800 by Lucious Groves, RN  Activity Management: activity adjusted per tolerance  Taken 08/20/2020 1600 by Lucious Groves, RN  Activity Management: activity adjusted per tolerance  Taken 08/20/2020 1400 by Lucious Groves, RN  Activity Management: activity adjusted per tolerance  Taken 08/20/2020 1200 by Lucious Groves, RN  Activity Management: activity adjusted per tolerance  Taken 08/20/2020 1000 by Lucious Groves, RN  Activity Management: activity adjusted per tolerance  Taken 08/20/2020 0800 by Lucious Groves, RN  Activity Management: activity adjusted per tolerance  VTE Prevention/Management:   ambulation promoted   bleeding precautions maintained   fluids promoted  Intervention: Prevent Infection  Recent Flowsheet Documentation  Taken 08/20/2020 0800 by Lucious Groves, RN  Infection Prevention:   environmental surveillance performed   equipment surfaces disinfected   hand hygiene promoted   personal protective equipment utilized   rest/sleep promoted   single patient room provided  Goal: Optimal Comfort and Wellbeing  Outcome: Ongoing - Unchanged  Goal: Readiness for Transition of Care  Outcome: Ongoing - Unchanged  Goal: Rounds/Family Conference  Outcome: Ongoing - Unchanged     Problem: Seizure Disorder Comorbidity  Goal: Maintenance of Seizure Control  Outcome: Ongoing - Unchanged  Intervention: Maintain Seizure-Symptom Control  Recent Flowsheet Documentation  Taken 08/20/2020 0800 by Lucious Groves, RN  Seizure Precautions: activity supervised     Problem: Fall Injury Risk  Goal: Absence of Fall and Fall-Related Injury  Outcome: Ongoing - Unchanged  Intervention: Promote Injury-Free Environment  Recent Flowsheet Documentation  Taken 08/20/2020 0800 by Lucious Groves, RN  Safety Interventions:   bed alarm   fall reduction program maintained     Problem: Self-Care Deficit  Goal: Improved Ability to Complete Activities of Daily Living  Outcome: Ongoing - Unchanged

## 2020-08-21 NOTE — Unmapped (Addendum)
Outpatient Follow-Up:  - Given synergistic interaction of Depakote and Lamictal, initiated Lamictal at 25 mg daily for 2 weeks (7/31-8/13). After 2 weeks will increase Lamictal to 25 mg BID and decrease Depakote from 750 mg to 500 mg daily. Will need Lamictal level checked at this time.  - Monitor sodium as Depakote is reduced   - Will need neurology follow-up for new diagnosis of epilepsy  - Will need psych follow-up to monitor symptom control with medication changes  - Discuss AC for A Fib with outpatient primary care  - Discuss cancer screenings with PCP (hx positive cologard but no colonoscopy; calcifications of prostate on CT; RUL lung nodules on CT)    Nicholas Rush is a 55 y.o. male with a past medical history of HTN, Paroxysmal Afib not on anticoagulation, Bipolar I Disorder, Depakote-Induced SIADH, Anxiety, Depression, Alcohol Use disorder c/b withdrawal seizures, tobacco use, Psoriatic arthritis on Humira, who was admitted to [facility] on 08/15/2020 for seizure acitivity.     Epilepsy and alcohol withdrawal seizures  Pt has long history of alcohol withdrawal seizures and seizures in setting of hyponatremia (Na to 111). Presented to ED after 5 minute seizure with 20 minutes of post-ictal confusion prior to returning to baseline on 7/28. Unlikely that seizure is secondary to alcohol withdrawal given sobriety for 6 weeks with ethanol level wnl. Hyponatremia could potentially be lowering seizure threshold but level is within typical range on presentation. MRI and routine EEG in the past have been without seizure focus. Pt does not have obvious provoking factor at this time and history is concerning for potential epilepsy. He has been on Depakote, as below, for bipolar disorder but not at a therapeutic level for seizure control. At this point, treatment with an AED to therapeutic levels is warranted. Per discussion with psychiatry, initiated Lamictal inpatient with plans to titrate outpatient. He remains on Depakote and will likely continue this even as Lamictal becomes therapeutic, as using Depakote and Lamictal together will reduce the dose needed of each and patient states he has had good symptom control (Bipolar) with Depakote.       Hyponatremia 2/2 Drug-Induced SIADH  Has been thoroughly worked up in the past and based on urine studies diagnosed as SIADH most likely secondary to Depakote. Risk/benefit of SIADH (relatively well controlled with fluid restriction and salt tabs at home) and Depakote treatment (has been the most effective for bipolar as below) was discussed with patient and wife previously and decision was made to continue Depakote for mood symptoms. Sodium is 127 (within normal baseline for patient) on presentation. Despite thought that SIADH is most likely due to Depakote use, patient has had some positive cancer screenings that need followed up in the outpatient setting (history of positive cologard with no colonoscopy; prostate with calcifications on CT scan although PSA WNL, CT chest with RUL nodules; yearly scans recommended). Patient continued on 1g NaCl tabs TID. Will need sodium levels monitored by outpatient PCP as Lamictal and Depakote doses are adjusted.     Paroxsymal Afib  Not on anticoagulation. This is something that should be discussed in the outpatient setting. Risks and benefits, including stroke risk reduction, of anticoagulation should be weighed for the patient. He was continued on his home Diltiazem 360mg  daily. Telemetry monitoring while inpatient mostly showed NSR, and NSR w/ PACs on 7/31     Bipolar Disorder  Follows with [facility]. Improvement in Bipolar symptoms while on Depakote. Was briefly switched to Lithium in the past  but could not tolerate due to side effects/toxicity. Per chart review, control of mood symptoms is high priority to patient and family. Now that he warrants treatment for suspected epilepsy, it will be important to determine if mood disorder and seizures can be treated with the same medication or if he requires 2 medications to treat the 2 conditions. Unfortunately due to SIADH as above, further titration of Depakote is not possible. Plan to titrate to combination of Depakote and Lamictal, as above. He was maintained on his home Zyprexa.      Alcohol Use Disorder  Pt reports chronic alcohol use history. Has been sober over past 6 weeks so lower concern for withdrawal symptoms. Patient did not seize in the ED but did exhibit some bilateral UE tremulousness on the morning of 7/29. Monitored on CIWA with scores of 0-1. He was maintained on his home Gabapentin and Thiamine.      Tobacco Use Disorder  - Nicotine patch

## 2020-08-22 MED ORDER — LAMOTRIGINE 25 MG TABLET
ORAL_TABLET | ORAL | 0 refills | 70.00000 days | Status: CP
Start: 2020-08-22 — End: 2020-10-31

## 2020-08-22 NOTE — Unmapped (Signed)
PT NEEDS A TRANSITIONS APPT

## 2020-08-23 ENCOUNTER — Encounter: Admit: 2020-08-23 | Payer: MEDICAID

## 2020-08-23 ENCOUNTER — Encounter: Admit: 2020-08-23 | Discharge: 2020-09-21 | Payer: MEDICAID

## 2020-08-23 ENCOUNTER — Inpatient Hospital Stay: Admit: 2020-08-23 | Payer: MEDICAID

## 2020-08-23 ENCOUNTER — Ambulatory Visit: Admit: 2020-08-23 | Payer: MEDICAID

## 2020-08-29 ENCOUNTER — Telehealth: Admit: 2020-08-29 | Discharge: 2020-08-30 | Payer: MEDICAID

## 2020-08-29 DIAGNOSIS — R569 Unspecified convulsions: Principal | ICD-10-CM

## 2020-08-29 DIAGNOSIS — I10 Essential (primary) hypertension: Principal | ICD-10-CM

## 2020-08-29 DIAGNOSIS — F1021 Alcohol dependence, in remission: Principal | ICD-10-CM

## 2020-08-29 DIAGNOSIS — F17200 Nicotine dependence, unspecified, uncomplicated: Principal | ICD-10-CM

## 2020-08-29 DIAGNOSIS — Z79899 Other long term (current) drug therapy: Principal | ICD-10-CM

## 2020-08-29 MED ORDER — SODIUM CHLORIDE 1 GRAM TABLET
ORAL_TABLET | Freq: Three times a day (TID) | ORAL | 2 refills | 34.00000 days | Status: CP
Start: 2020-08-29 — End: 2021-08-29

## 2020-08-29 MED ORDER — POLYETHYLENE GLYCOL 3350 17 GRAM ORAL POWDER PACKET
PACK | Freq: Every day | ORAL | 5 refills | 100 days | Status: CP | PRN
Start: 2020-08-29 — End: 2020-09-28

## 2020-08-29 MED ORDER — GABAPENTIN 300 MG CAPSULE
ORAL_CAPSULE | Freq: Two times a day (BID) | ORAL | 5 refills | 30.00000 days | Status: CP
Start: 2020-08-29 — End: 2020-09-28

## 2020-08-29 MED ORDER — ASPIRIN 81 MG TABLET,DELAYED RELEASE
ORAL_TABLET | Freq: Every day | ORAL | prn refills | 30.00000 days
Start: 2020-08-29 — End: 2020-08-29

## 2020-08-29 NOTE — Unmapped (Addendum)
Has been sober since discharge from acute inpatient rehab.   Still with some cravings, but motivated for abstinence for his health -- does not want to have another seizure.   Partner also working on sobriety.
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
## 2020-08-29 NOTE — Unmapped (Addendum)
Smoking 1ppd.  Precontemplative to cut back.   Wants to focus on maintaining sobriety for now

## 2020-08-29 NOTE — Unmapped (Signed)
Recently discharged from Neurology

## 2020-08-31 MED ORDER — OMEPRAZOLE 20 MG CAPSULE,DELAYED RELEASE
ORAL_CAPSULE | 0 refills | 0 days
Start: 2020-08-31 — End: ?

## 2020-09-02 ENCOUNTER — Encounter: Admit: 2020-09-02 | Discharge: 2020-09-02 | Payer: MEDICAID

## 2020-09-02 DIAGNOSIS — G40409 Other generalized epilepsy and epileptic syndromes, not intractable, without status epilepticus: Principal | ICD-10-CM

## 2020-09-02 DIAGNOSIS — E871 Hypo-osmolality and hyponatremia: Principal | ICD-10-CM

## 2020-09-02 LAB — BASIC METABOLIC PANEL
ANION GAP: 9 mmol/L (ref 5–14)
BLOOD UREA NITROGEN: 12 mg/dL (ref 9–23)
BUN / CREAT RATIO: 16
CALCIUM: 9.1 mg/dL (ref 8.7–10.4)
CHLORIDE: 102 mmol/L (ref 98–107)
CO2: 23.2 mmol/L (ref 20.0–31.0)
CREATININE: 0.77 mg/dL
EGFR CKD-EPI (2021) MALE: >90 mL/min/{1.73_m2} (ref >=60)
GLUCOSE RANDOM: 116 mg/dL (ref 70–179)
POTASSIUM: 4.4 mmol/L (ref 3.4–4.8)
SODIUM: 134 mmol/L — ABNORMAL LOW (ref 135–145)

## 2020-09-02 MED ORDER — OMEPRAZOLE 20 MG CAPSULE,DELAYED RELEASE
ORAL_CAPSULE | 0 refills | 0 days
Start: 2020-09-02 — End: ?

## 2020-09-04 LAB — LAMOTRIGINE LEVEL: LAMOTRIGINE LEVEL: 1.3 ug/mL — ABNORMAL LOW

## 2020-09-04 NOTE — Unmapped (Signed)
error 

## 2020-09-06 NOTE — Unmapped (Signed)
Remote patient monitoring

## 2020-09-07 NOTE — Unmapped (Signed)
Recent diagnosis, presented in afib w RVR, asx, thought to be due to alcohol withdrawal  Echo completed 5/5, limited but normal LVEF   chads2vasc score = 1 for h/o HTN, low-mod risk for stroke  Advised to start OTC aspirin 81mg 

## 2020-09-07 NOTE — Unmapped (Signed)
Generalized weakness, particularly in the lower legs, after multiple hospital stays  He remains essentially homebound.  Driver's license revoked in the past  On video today, he appears alert and well-groomed with mild cognitive slowing that may be his baseline.    Reports his approximate routine is as follows:  Up at 6:30am-7am, eats breakfast. brushes teeth, washes face.  Passes morning hours watching TV or reading.   Heats up lunch around 1pm.  occ afternoon naps.   Dinner is around 8:30pm.   Goes to sleep around 11:30pm. Sleeping well.   Needs standby assistance with showering.    Using walker to walk around the house (previously was rolling around on a rolling stool)  Awaiting to start [facility] PT  Motivated to walk independently.

## 2020-09-07 NOTE — Unmapped (Signed)
Has home BP cuff  MyChart BP Review Systolic Diastolic Pulse   1/61/0960 133 71 72   previously when he was actively drinking he was on lisinopril as well as amlodipine.  Currently only adherent to diltiazem for rate control
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
## 2020-09-09 ENCOUNTER — Telehealth: Admit: 2020-09-09 | Discharge: 2020-09-10 | Payer: MEDICAID

## 2020-09-09 ENCOUNTER — Ambulatory Visit
Admit: 2020-09-09 | Discharge: 2020-09-10 | Payer: MEDICAID | Attending: Student in an Organized Health Care Education/Training Program | Primary: Student in an Organized Health Care Education/Training Program

## 2020-09-09 DIAGNOSIS — F17200 Nicotine dependence, unspecified, uncomplicated: Principal | ICD-10-CM

## 2020-09-09 DIAGNOSIS — F1021 Alcohol dependence, in remission: Principal | ICD-10-CM

## 2020-09-09 DIAGNOSIS — L409 Psoriasis, unspecified: Principal | ICD-10-CM

## 2020-09-09 DIAGNOSIS — F319 Bipolar disorder, unspecified: Principal | ICD-10-CM

## 2020-09-09 MED ORDER — NALTREXONE 50 MG TABLET
ORAL_TABLET | Freq: Every day | ORAL | 1 refills | 30 days | Status: CP
Start: 2020-09-09 — End: ?

## 2020-09-09 MED ORDER — ADALIMUMAB SYRINGE CITRATE FREE 40 MG/0.4 ML
SUBCUTANEOUS | 11 refills | 0.00000 days | Status: CP
Start: 2020-09-09 — End: ?
  Filled 2020-09-11: qty 2, 28d supply, fill #0

## 2020-09-09 MED ORDER — DIVALPROEX ER 250 MG TABLET,EXTENDED RELEASE 24 HR
ORAL_TABLET | Freq: Every evening | ORAL | 0 refills | 90.00000 days | Status: CP
Start: 2020-09-09 — End: 2020-12-08

## 2020-09-09 MED ORDER — LAMOTRIGINE 25 MG TABLET
ORAL_TABLET | Freq: Every day | ORAL | 1 refills | 30.00000 days | Status: CP
Start: 2020-09-09 — End: 2020-11-08

## 2020-09-09 MED ORDER — OLANZAPINE 20 MG TABLET
ORAL_TABLET | Freq: Every evening | ORAL | 1 refills | 30 days | Status: CP
Start: 2020-09-09 — End: 2020-11-08

## 2020-09-09 NOTE — Unmapped (Signed)
The patient reports they are currently: at home. I spent 8 minutes on the real-time audio and video with the patient on the date of service. I spent an additional 5 minutes on pre- and post-visit activities on the date of service.     The patient was physically located in [place] or a state in which I am permitted to provide care. The patient and/or parent/guardian understood that s/he may incur co-pays and cost sharing, and agreed to the telemedicine visit. The visit was reasonable and appropriate under the circumstances given the patient's presentation at the time.    The patient and/or parent/guardian has been advised of the potential risks and limitations of this mode of treatment (including, but not limited to, the absence of in-person examination) and has agreed to be treated using telemedicine. The patient's/patient's family's questions regarding telemedicine have been answered.     If the visit was completed in an ambulatory setting, the patient and/or parent/guardian has also been advised to contact their provider???s office for worsening conditions, and seek emergency medical treatment and/or call 911 if the patient deems either necessary.      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  -??Course:??Previously on enbrel without adequate control; light therapy  - Contraindicated therapy: MTX given history of alcohol use disorder  -??Restart??adalimumab 80mg  first dose, then 40 mg every fourteen days  -??Continue??halobetasol (ULTRAVATE) 0.05 % ointment; Apply topically Two (2) times a day. To affected areas as needed. ??Dispense: 60 g; Refill: 5  ??  High risk medication: adalimumab  - quant gold TB test negative 07/2020  The patient was advised to call for an appointment should any new, changing, or symptomatic lesions develop.     RTC: 1 year or sooner as needed _________________________________________________________________      Chief Complaint     No chief complaint on file.      HPI     Nicholas Rush is a 55 y.o. male who presents as a returning patient (last seen 08/26/2018) to [facility] for follow up of psoriasis. on Humira.    Denies new diagnosis of AI diseases, hep B, hep c, cancer, CHF, multiple sclerosis, or infections in the last year.  Was recently hospitalized for an epileptic seizure and is now on AED.  Denies any issues with injection site reactions.  Psoriasis is clear.  PSA well controlled, but his fingers are starting to flare off the Humira.  He is having trouble closing hands into fist.  Has been off Humira for about 1 month.  TB test neg 08/18/20.    The patient denies any other new or changing lesions or areas of concern.     Pertinent Past Medical History         Problem List        Musculoskeletal and Integument    Psoriasis     Dermatologist - Dr. Deloria Lair in [facility]    History  -he has had plaque psoriasis for the past 20+ years. He was previously followed at [facility].In addition to topical steroids, he has been treated with light therapy and Enbrel for 10+ years prior to switching to Humira due to decreased effectiveness of Enbrel. Since starting Humira, he notes improvement of his psoriasis. He notes a history of possible psoriatic arthritis involving his fingers, but this has been well-controlled with Enbrel and now Humira.  Past Medical History, Family History, Social History, Medication List, Allergies, and Problem List were reviewed in the rooming section of Epic.     ROS: Other than symptoms mentioned in the HPI, no fevers, chills, or other skin complaints    Physical Examination     GENERAL: Well-appearing male in no acute distress, resting comfortably.  NEURO: Alert and oriented, answers questions appropriately  PSYCH: Normal mood and affect  Images were not uploaded by the patient.          (Approved Template 10/02/2019)

## 2020-09-09 NOTE — Unmapped (Signed)
Medication change: Starting 09/14/20, increase Lamictal from 50 mg daily to 75 mg daily. No other medication changes; continue taking Depakote 500 mg nightly, Zyprexa 20 mg nightly, and naltrexone 50 mg daily. I will see you for follow-up on 10/21/20 at 10:45 AM.     If you need to contact me before our next appointment, for non-urgent concerns, please call my confidential voicemail at [phone]. For urgent mental health matters, or after-hours, weekends, or holidays, please call [phone]. For emergencies, please call 911 or go to your closest emergency room.    Please remember, regarding your appointment times:  - If for any reason you arrive 15 minutes later than your scheduled appointment time, you may not be seen and your visit may be rescheduled.  - If you need to cancel your appointment, we ask that you call [phone] at least 24 hours before your scheduled appointment.  - Please remember that we will not automatically reschedule missed appointments.  - If you miss two (2) appointments without letting us know in advance, you will likely be referred to a provider in your community.  - We will do our best to be on time.  Sometimes an emergency will arise that might cause your clinician to be late.  We will try to inform you of this when you check in for your appointment. If you wait more than 15 minutes past your appointment time without such notice, please speak with the front desk staff.  For more information and reminders regarding clinic policies (these were provided when you were admitted to the clinic), please ask the front desk.

## 2020-09-10 DIAGNOSIS — L409 Psoriasis, unspecified: Principal | ICD-10-CM

## 2020-09-11 MED ORDER — CLOBETASOL 0.05 % SCALP SOLUTION
Freq: Two times a day (BID) | TOPICAL | 6 refills | 0.00000 days | Status: CP
Start: 2020-09-11 — End: 2021-09-11

## 2020-09-11 MED FILL — FLUTICASONE PROPIONATE 50 MCG/ACTUATION NASAL SPRAY,SUSPENSION: NASAL | 30 days supply | Qty: 16 | Fill #0

## 2020-09-11 NOTE — Unmapped (Signed)
Nicholas Rush continues to do well on Humira. He reports some mild scalp psoriasis, and denied any topicals for use for this. We discussed options, and I offered to message Dr. Charlsie Merles for clobetasol shampoo for him to try. We reviewed appropriate use.    Otherwise, he's missed the last month of therapy due to needed a clinic visit. He reports some hand stiffness, but otherwise no flaring. We discussed no need to re-load Humira at this point.     [facility] Clinical Assessment & Refill Coordination Note    Nicholas Rush, DOB: 1965/10/18  Phone: [phone] (home)     All above HIPAA information was verified with patient.     Was a Nurse, learning disability used for this call? No    Specialty Medication(s):   Inflammatory Disorders: Humira     Current Outpatient Medications   Medication Sig Dispense Refill   ??? acetaminophen (TYLENOL) 500 MG tablet Take 500 mg by mouth every six (6) hours as needed for pain.     ??? ADALIMUMAB SYRINGE CITRATE FREE 40 MG/0.4 ML Inject the contents of 1 syringe (40 mg) under the skin every 14 days 2 each 11   ??? aspirin (ECOTRIN) 81 MG tablet Take 1 tablet (81 mg total) by mouth daily. 30 tablet prn   ??? calcium-vitamin D (CALCIUM-VITAMIN D) 500 mg-5 mcg (200 unit) per tablet Take 1 tablet by mouth Three (3) times a day with a meal. 30 tablet 0   ??? cetirizine (ZYRTEC) 10 MG tablet Take 1 tablet (10 mg total) by mouth in the morning. 30 tablet 0   ??? diltiazem (TIAZAC) 360 MG 24 hr capsule Take 1 capsule (360 mg total) by mouth daily. This replaces amlodipine. 30 capsule 1   ??? divalproex ER (DEPAKOTE ER) 250 MG extended release 24 hr tablet Take 2 tablets (500 mg total) by mouth nightly. 180 tablet 0   ??? fluticasone propionate (FLONASE) 50 mcg/actuation nasal spray Use 2 sprays in each nostril in the morning. 16 g 12   ??? folic acid (FOLVITE) 1 MG tablet Take 1 tablet (1 mg total) by mouth in the morning. 30 tablet 11   ??? gabapentin (NEURONTIN) 300 MG capsule Take 1 capsule (300 mg total) by mouth Two (2) times a day. 60 capsule 5   ??? lamoTRIgine (LAMICTAL) 25 MG tablet Take 3 tablets (75 mg total) by mouth daily. Start taking 75 mg total daily on 09/14/20. 90 tablet 1   ??? magnesium oxide (MAG-OX) 400 mg (241.3 mg elemental magnesium) tablet Take 2 tablets (800 mg total) by mouth Two (2) times a day. 120 tablet 11   ??? multivitamin,tx-iron-minerals (THERA-M) Tab Take 1 tablet by mouth daily. 30 tablet 0   ??? naltrexone (DEPADE) 50 mg tablet Take 1 tablet (50 mg total) by mouth daily. 30 tablet 1   ??? nicotine (NICODERM CQ) 21 mg/24 hr patch Place 1 patch on the skin in the morning. Remove old patch before applying new one . 28 patch 5   ??? OLANZapine (ZYPREXA) 20 MG tablet Take 1 tablet (20 mg total) by mouth nightly. 30 tablet 1   ??? polyethylene glycol (MIRALAX) 17 gram packet Take 17 g by mouth daily as needed (constipation). 100 packet 5   ??? sodium chloride 1 gram tablet Take 1 tablet (1 g total) by mouth Three (3) times a day with a meal. 100 tablet 2   ??? thiamine mononitrate, vit B1, 100 mg Tab tablet Take 1 tablet (  100 mg total) by mouth in the morning. 90 tablet 0     No current facility-administered medications for this visit.        Changes to medications: Nicholas Rush reports no changes at this time.    Allergies   Allergen Reactions   ??? Ativan [Lorazepam] Hallucinations and Confusion   ??? Tramadol      Risk of seizures   ??? Hydroxyzine Itching     Sedation.  Ineffective for reducing anxiety.       Changes to allergies: No    SPECIALTY MEDICATION ADHERENCE     Humira - 0 left  Medication Adherence    Patient reported X missed doses in the last month: all  Specialty Medication: Humira          Specialty medication(s) dose(s) confirmed: Regimen is correct and unchanged.     Are there any concerns with adherence? No    Adherence counseling provided? Not needed    CLINICAL MANAGEMENT AND INTERVENTION      Clinical Benefit Assessment:    Do you feel the medicine is effective or helping your condition? Yes    Clinical Benefit counseling provided? Not needed    Adverse Effects Assessment:    Are you experiencing any side effects? No    Are you experiencing difficulty administering your medicine? No    Quality of Life Assessment:    Quality of Life    Rheumatology  Oncology  Dermatology  1. What impact has your specialty medication had on the symptoms of your skin condition (i.e. itchiness, soreness, stinging)?: Tremendous  2. What impact has your specialty medication had on your comfort level with your skin?: Tremendous  Cystic Fibrosis          Have you discussed this with your provider? Not needed    Acute Infection Status:    Acute infections noted within Epic:  No active infections  Patient reported infection: None    Therapy Appropriateness:    Is therapy appropriate? Yes, therapy is appropriate and should be continued    DISEASE/MEDICATION-SPECIFIC INFORMATION      For patients on injectable medications: Patient currently has 0 doses left.  Next injection is scheduled for asap (overdue, medication refill previously denied, needs appt).    PATIENT SPECIFIC NEEDS     - Does the patient have any physical, cognitive, or cultural barriers? No    - Is the patient high risk? No    - Does the patient require a Care Management Plan? No     - Does the patient require physician intervention or other additional services (i.e. nutrition, smoking cessation, social work)? No      SHIPPING     Specialty Medication(s) to be Shipped:   Inflammatory Disorders: Humira    Other medication(s) to be shipped: No additional medications requested for fill at this time     Changes to insurance: No    Delivery Scheduled: Yes, Expected medication delivery date: 8/24.     Medication will be delivered via Same Day Courier to the confirmed prescription address in [facility].    The patient will receive a drug information handout for each medication shipped and additional FDA Medication Guides as required.  Verified that patient has previously received a Conservation officer, historic buildings and a Surveyor, mining.    The patient or caregiver noted above participated in the development of this care plan and knows that they can request review of or adjustments to the care plan at any  time.      All of the patient's questions and concerns have been addressed.    Lanney Gins   [facility] Specialty Pharmacist

## 2020-09-19 ENCOUNTER — Telehealth: Admit: 2020-09-19 | Discharge: 2020-09-20 | Payer: MEDICAID

## 2020-09-19 DIAGNOSIS — G8929 Other chronic pain: Principal | ICD-10-CM

## 2020-09-19 DIAGNOSIS — M5440 Lumbago with sciatica, unspecified side: Principal | ICD-10-CM

## 2020-09-19 NOTE — Unmapped (Addendum)
Had follow-up with [facility] this week, he took an [place].  They did a bone density test.  Will see a back surgeon in 2 weeks.   Mobility most limited by his back pain.  Feels acute pain and mild stiffness upon standing, takes a while to warm up to get going.  Worse in the morning. Had been out of humira before derm visit.  - discussed that Humira is likely helping his chronic back pain to some degree due to h/o psoriatic arhtritis  - he is eager to move forward with [facility], unsure why they have not seen him since 8/8, I will call and follow-up.   - currently walking with a Rollator in the home, and a cane for very short distances.  Trying to keep up with HEP.  Notes he is now able to get in and out of a car much easier than before.

## 2020-09-19 NOTE — Unmapped (Signed)
[facility] - [facility]  Family Medicine  Established Patient Video Visit Note  This medical encounter was conducted virtually using Epic@  TeleHealth protocols.        The patient reports they are currently: at home. I spent 27 minutes on the real-time audio and video with the patient on the date of service. I spent an additional 10 minutes on pre- and post-visit activities on the date of service.     The patient was physically located in [place] or a state in which I am permitted to provide care. The patient and/or parent/guardian understood that s/he may incur co-pays and cost sharing, and agreed to the telemedicine visit. The visit was reasonable and appropriate under the circumstances given the patient's presentation at the time.    The patient and/or parent/guardian has been advised of the potential risks and limitations of this mode of treatment (including, but not limited to, the absence of in-person examination) and has agreed to be treated using telemedicine. The patient's/patient's family's questions regarding telemedicine have been answered.     If the visit was completed in an ambulatory setting, the patient and/or parent/guardian has also been advised to contact their provider???s office for worsening conditions, and seek emergency medical treatment and/or call 911 if the patient deems either necessary.        Assessment/Plan:      Problem List Items Addressed This Visit        Other    Alcohol use disorder, moderate, in early remission (CMS-HCC)     Remains sober, applauded  Denies cravings         Chronic midline low back pain     Had [facility]-up with [facility] this week, he took an Pharmacist, community.  They did a bone density test.  Will see a back surgeon in 2 weeks.   Mobility most limited by his back pain.  Feels acute pain and mild stiffness upon standing, takes a while to warm up to get going.  Worse in the morning. Had been out of humira before derm visit.  - discussed that Humira is likely helping his chronic back pain to some degree due to h/o psoriatic arhtritis  - he is eager to move forward with [facility] PT, unsure why they have not seen him since 8/8, I will call and follow-up.   - currently walking with a Rollator in the home, and a cane for very short distances.  Trying to keep up with HEP.  Notes he is now able to get in and out of a car much easier than before.                Followup:  Return pending. I've only met [name] twice in person this year.  I discussed that given his multiple medical comorbidities, it is ideal for him to be managed by a PCP who can see him in clinic.  Transportation is a barrier to my [facility] clinic, but he is able to get to [employer] much easier. He has an appointment w Primary Care upcoming.  Encouraged him to think about which option would be best to serve him.  Deferred scheduling a follow-up right now.  St    Additional unmet primary care needs:  - colonoscopy to f/u positive Cologuard, but he preferred to wait.   - repeat LDCT in 2023  - nephrology follow-up for chronic hyponatremia      Preventive Care  Health Maintenance Due  Topic Date Due   ??? DTaP/Tdap/Td Vaccines (2 - Td or Tdap) 02/21/2019   ??? FIT-DNA Stool Test  06/17/2019   ??? Zoster Vaccines (2 of 2) 07/27/2020   ??? COVID-19 Vaccine (4 - Booster for Moderna series) 08/12/2020   ??? Influenza Vaccine (1) 09/19/2020       I personally spent 37 minutes face-to-face and non-face-to-face in the care of this patient, which includes all pre, intra, and post visit time on the date of service.    Future Appointments   Date Time Provider Department Center   10/02/2020  1:00 PM Nurum Joeseph Amor, MD [facility]   10/21/2020 10:45 AM Augustin Coupe, MD [facility]   11/20/2020  1:00 PM Truddie Hidden, MD [facility]            Subjective:   CC:   Chief Complaint   Patient presents with   ??? Follow-up       Patient Care Team:  Rory Montel Doreatha Lew, MD as PCP - General (Family Medicine)  Lorelle Gibbs, MD as Attending Provider (Psychiatry)  Augustin Coupe, MD as Resident (Psychiatry)    HPI  [name] III is a 55 y.o. male requesting a video visit to discuss the following issues:    PTHomeBP  Last 3 Home BP Readings Systolic Diastolic   09/19/2020 161 98   08/29/2020 133 71       For details, pls see A/P    BP Readings from Last 3 Encounters:   09/09/20 99/51   09/02/20 100/60   08/23/20 122/82       Wt Readings from Last 3 Encounters:   09/09/20 89.8 kg (198 lb)   08/16/20 88.8 kg (195 lb 12.3 oz)   07/20/20 87.1 kg (192 lb 0.3 oz)       BMI Readings from Last 3 Encounters:   09/09/20 26.85 kg/m??   08/16/20 25.83 kg/m??   07/20/20 24.65 kg/m??         ROS  As per HPI.       HISTORY  I have reviewed the patient's problem list, current medications, and allergies and have updated/reconciled them as needed.    [name] III  reports that he has been smoking cigarettes. He has a 45.00 pack-year smoking history. He has never used smokeless tobacco.       Objective:     General: Well appearing , in no acute distress  Skin: Color is normal. No rashes.  Psych: Appropriate affect, normal mood, speech a little delayed  Resp: Normal work of breathing, no retractions    [facility]  [address], [place], [place] [address] ??? Telephone [phone] ??? Fax [phone]

## 2020-09-20 NOTE — Unmapped (Signed)
Remains sober, applauded  Denies cravings

## 2020-09-22 ENCOUNTER — Encounter: Admit: 2020-09-22 | Payer: MEDICAID

## 2020-09-26 NOTE — Unmapped (Signed)
[facility] Refill Coordination Note    Specialty Medication(s) to be Shipped:   Inflammatory Disorders: Humira    Other medication(s) to be shipped: No additional medications requested for fill at this time     Nicholas Rush, DOB: 04/02/1965  Phone: [phone] (home)       All above HIPAA information was verified with patient.     Was a Nurse, learning disability used for this call? No    Completed refill call assessment today to schedule patient's medication shipment from the [facility] [phone]).  All relevant notes have been reviewed.     Specialty medication(s) and dose(s) confirmed: Regimen is correct and unchanged.   Changes to medications: Nicholas Rush reports no changes at this time.  Changes to insurance: No  New side effects reported not previously addressed with a pharmacist or physician: None reported  Questions for the pharmacist: No    Confirmed patient received a Conservation officer, historic buildings and a Surveyor, mining with first shipment. The patient will receive a drug information handout for each medication shipped and additional FDA Medication Guides as required.       DISEASE/MEDICATION-SPECIFIC INFORMATION        For patients on injectable medications: Patient currently has 1 doses left.  Next injection is scheduled for 09/10.    SPECIALTY MEDICATION ADHERENCE     Medication Adherence    Patient reported X missed doses in the last month: 0  Specialty Medication: humira 40mg /0.75ml  Patient is on additional specialty medications: No  Patient is on more than two specialty medications: No  Any gaps in refill history greater than 2 weeks in the last 3 months: no  Demonstrates understanding of importance of adherence: yes  Informant: patient  Reliability of informant: reliable  Provider-estimated medication adherence level: good  Patient is at risk for Non-Adherence: No  Reasons for non-adherence: no problems identified              Were doses missed due to medication being on hold? No      Humira40/0.4   mg/ml: 14 days of medicine on hand     REFERRAL TO PHARMACIST     Referral to the pharmacist: Not needed      [facility]     Shipping address confirmed in Epic.     Delivery Scheduled: Yes, Expected medication delivery date: 09/16.     Medication will be delivered via Same Day Courier to the prescription address in Epic WAM.    Antonietta Barcelona   [facility] Specialty Technician

## 2020-09-27 NOTE — Unmapped (Signed)
Patient ID: Nicholas Rush is a 55 y.o. male who presents for to establish care.  Informant: Patient came to appointment alone.  Assessment/Plan:   Regulatory affairs officer  As part of today's comprehensive wellness visit, I have reviewed and updated standard preventive services and immunizations as documented in the electronic record.   The following preventive services were advised:  Influenza: ordered  Tdap: deferred until later visit  Shingrix: deferred until later visit  Colon screening: ordered  Diabetes screening: ordered  Lipid screening: ordered  Hep C Ab: Negative  PSA: risks and benefits of prostate cancer screening were carefully reviewed -- deferred until later visit  Dental care: Patient to schedule  Eye care: Done within the past year  Advance care planning:      HCDM (patient stated preference): Bisbee,[name] - Spouse - [phone]   Medical Problems Addressed Today  Problem List Items Addressed This Visit        Cardiovascular and Mediastinum    Essential hypertension     Patient was previously on Norvasc and lisinopril however was noncompliant with both.  Subsequently was supposed to be placed on diltiazem.  Unclear what exactly the patient is taking.  We will need to call patient when she is at home with his actual medications.  Per pharmacy diltiazem was supposed be picked up in August however was not picked up until last week.  given patient's history of paroxysmal atrial fibrillation diltiazem would be a good choice especially if it can control his rate as well as his blood pressure.  Blood pressure looks fine in clinic today..         Paroxysmal atrial fibrillation (CMS-HCC)     Single episode.  Patient advised asa 81mg .  Very high fall risk.  Advised patient continue diltiazem as recommended by previous physician.            Musculoskeletal and Integument    Psoriatic arthritis (CMS-HCC)     On humira stable sx.              Other    Macrocytic anemia - Primary (Chronic)    Relevant Medications    ferrous sulfate 325 (65 FE) MG tablet    Other Relevant Orders    Colonoscopy    Iron & TIBC    Ferritin    Reticulocytes (Completed)    Vitamin B12 Level    Methylmalonic Acid, Quantitative    Folate Level    TSH    Hyponatremia (Chronic)     Check sodium level and basic chemistry         Relevant Orders    Albumin/creatinine urine ratio    Urinalysis    Albumin/creatinine urine ratio    Tobacco use disorder (Chronic)     Plan:   preparation    is ready to quit smoking. Quit date set for Today    Counseled to stop smoking. Discussed methods for quitting including cold [place], nicotine replacement products, and prescription medications. Patient wlll consider further. Patient is aware of the risks to health, long-term cost, and decreased quality of life while using tobacco products. Understands that tobacco use may affect the health of other family members or loved ones.    try a distraction for at least 5 minutes when I want a cigarette    have an open conversation with your spouse/partner that this is a very important part of your health and ask for their support  Relevant Medications    nicotine (NICODERM CQ) 21 mg/24 hr patch    Bipolar 1 disorder (CMS-HCC)     And need to clarify patient's medication regimen.  He does have follow-up with psychiatry October 3.  Appreciate plans to institute Lamictal as cutting back on Depakote.  Need to clarify whether patient is taking Zyprexa or Vraylar.  Per last note from psychiatry patient is taking Zyprexa 20 mg p.o. daily however patient's wife says he is taking 30 mg p.o. daily.  For some reason Leafy Kindle is also on patient's medication list.  He should not be on both.         Alcohol use disorder, moderate, in early remission (CMS-HCC)     Continue supplementation of folic acid and thiamine.  Patient may also take a general multivitamin.  Continue naltrexone.  Continue supporting patient's abstinence.         Positive colorectal cancer screening using DNA-based stool test    Relevant Orders    Colonoscopy    Recurrent falls    Thrombocytopenia (CMS-HCC)    Relevant Orders    CBC w/ Differential (Completed)    Seizure (CMS-HCC)     Last drink over 6 months ago.  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  Now that on seizure medication no recent seizure.  MRI with small vessel ischemic changes and volume loss.  Also had very low sodium during last admission in July.  Needs sodium rechecked.      Wife at work         Relevant Orders    Comprehensive Metabolic Panel    Lamotrigine Level    Spinal stenosis of lumbar region    Relevant Medications    methocarbamoL (ROBAXIN) 750 MG tablet    Driving safety issue     Patient wanted a letter to recommend reinstitution of driving.  Given patient's recent seizures I do not believe it is safe for patient to start driving.  I will leave it up to neurology to make recommendations on when may consider patient is safe and cleared from seizure restrictions on driving.           Compliance with medication regimen     Patient has multiple issues complicating medication compliance.  First of all he has 3 different pharmacies dispensing medications.  We will need to address simplification of patient's pharmacies and medications.  He will need to bring his medications to each and every visit.  Will need to contact wife in the morning.  Hopeful she will have a list of his medications at that time so we can compare what he supposed to be taking to what he actually is taking at home.  Patient is on several psychotropic drugs with sedating side effects of the combination which puts him at high risk for adverse drug reaction.           Other Visit Diagnoses     Vaccine counseling        Relevant Orders    INFLUENZA INJ MDCK PF, QUAD (FLUCELVAX)(68MO AND UP EGG FREE) (Completed)         Return in about 8 weeks (around 11/27/2020).       Subjective:   Problem-based concerns today  1. Seizure disorder since last visit 04/29/20, he has had multiple hospitalizations for seizures, initially thought to be 2/2 alcohol withdrawal, but then felt to be due to new diagnosis of epilepsy.  Course has been complicated by  hyponatremia, thought secondary to Depakote.  Started on lamotrigine during last hospitalization in August 2022 for both seizure control and bipolar disorder.   Per psychiatry given potential need to continue tapering and discontinuing Depakote and history of psychiatric decompensation in the past when on antipsychotic monotherapy plan is to continue titration of Lamictal to target mood stabilization and seizure control.  Patient is supposed to be on 75 mg of Lamictal.  ??  2.  Bipolar 1: Patient has not exhibited any symptoms of mania since roughly January 2022, even with medication adjustments in recent weeks including decrease of Depakote, which is at a sub-therapeutic level per last check on 08/16/20.     3.  Alcohol: He reported that he has not drank alcohol in 6 months however patient does have a tox screen positive for alcohol in June.  He has not had any seizures since last hospitalization.     Generalized weakness, particularly in the lower legs, after multiple hospital stays  He remains essentially homebound.  Driver's license revoked in the past  On video today, he appears alert and well-groomed with mild cognitive slowing that may be his baseline.    Reports his approximate routine is as follows:  Up at 6:30am-7am, eats breakfast. brushes teeth, washes face.  Passes morning hours watching TV or reading.   Heats up lunch around 1pm.  occ afternoon naps.   Dinner is around 8:30pm.   Goes to sleep around 11:30pm. Sleeping well.   Needs standby assistance with showering.    Using walker to walk around the house (previously was rolling around on a rolling stool)  Awaiting to start [facility]  Motivated to walk independently.   ??    4.  Tobacco:  0.5-1ppd     Fagerstrom Test for Nicotine Dependence:  Time after waking to smoke first cigarette - 31-60 minutesm (1)  Find it difficult to refrain from smoking when it is forbidden - No (0)  Cigarette hate to give up most - any other (0)  Number of cigarettes smoked per day - 11-20 (1)  Smoke more frequently in first hours of awakening - No (0)  Still smoke if so ill that you are in bed most of the day - No (0) was able to refrain from smoking and use patches while hospitalized    low dependence (3-4)    Previous quit attempts several  Reason(s) for continuing to smoke: Boredom and environment  Reason(s) for wanting to quit smoking: General health  Reason (s) for difficulty quitting in the past: Boredom and habit  Barrier(s) to quitting now: Boredom and habit  Triggers or situations for smoking: Boredom and habit        ROS  A comprehensive review of systems was conducted with negative results except as noted in the problem-based concerns above.  Updated History  As part of today's comprehensive wellness visit, I have reviewed and updated the following portions of the patient's history in the electronic record: allergies, current medications, past medical history, past surgical history, past family history, past social history and active problem list.  Allergies:   Lorazepam, Tramadol, and Hydroxyzine  Health Maintenance:     Health Maintenance   Topic Date Due   ??? DTaP/Tdap/Td Vaccines (2 - Td or Tdap) 02/21/2019   ??? FIT-DNA Stool Test  06/17/2019   ??? Zoster Vaccines (2 of 2) 07/27/2020   ??? COVID-19 Vaccine (4 - Booster for Moderna series) 08/12/2020   ???  Lipid Screening  02/21/2025   ??? Pneumococcal Vaccine 0-64 (3 - PPSV23 or PCV20) 09/05/2029   ??? Hepatitis C Screen  Completed   ??? Influenza Vaccine  Completed     Past Medical/Surgical History:     Patient Active Problem List   Diagnosis   ??? Bipolar 1 disorder (CMS-HCC)   ??? Alcohol use disorder, moderate, in early remission (CMS-HCC)   ??? Alcohol use disorder, severe, dependence (CMS-HCC)   ??? Anxiety state ??? Essential hypertension   ??? Psoriasis   ??? Psoriatic arthritis (CMS-HCC)   ??? Positive colorectal cancer screening using DNA-based stool test   ??? Recurrent falls   ??? Thrombocytopenia (CMS-HCC)   ??? Macrocytic anemia   ??? Hyponatremia   ??? Tobacco use disorder   ??? Encephalopathy   ??? Chronic midline low back pain   ??? Paroxysmal atrial fibrillation (CMS-HCC)   ??? Recurrent Clostridioides difficile infection   ??? Seizure (CMS-HCC)   ??? Spinal stenosis of lumbar region   ??? Traumatic dislocation of finger   ??? Driving safety issue   ??? Compliance with medication regimen     Past Medical History:   Diagnosis Date   ??? Alcohol withdrawal syndrome, with delirium (CMS-HCC) 05/11/2012   ??? Anxiety    ??? Bipolar 1 disorder (CMS-HCC)    ??? Depressive disorder    ??? Erectile dysfunction    ??? Esophageal reflux    ??? History of pyloric stenosis    ??? Hypertension    ??? Insomnia    ??? Psoriasis     Dermatologist - Dr. Deloria Lair in [facility]   ??? Seizure (CMS-HCC) 02/20/2019   ??? Tobacco use     1ppd     Past Surgical History:   Procedure Laterality Date   ??? PYLOROMYOTOMY     ??? WISDOM TOOTH EXTRACTION       Social History:     Social History     Social History Narrative    Updated 01/03/2020        Lives in [place] with wife and two children.  Currently unemployed had Worked at [employer].  Current smoker.History of excessive ETOh abuse has been sober since September 2021.Marland Kitchen           PSYCHIATRIC HX:     -Current provider(s):  [facility]. Roxanne Gates     -Suicide attempts/SIB: NO    -Psych Hospitalizations:  2014 for depression at [facility]    -Med compliance hx: Poor,      -Fa hx suicide: YES, sister attempted         SUBSTANCE ABUSE HX:     -Current using substance: sig hx of alcohol use (last drink Sept 2021) and was hospitalized for withdrawal with DT    -Hx w/d sxs: YES    -Sz Hx: YES,    -DT Hx:YES, Sept 2021        SOCIAL HX:    -Current living environment: lives in [place] with wife and 2 childrne     -Current support: wife, father     -Violence (perp): verbal, has been kicking walls     -Access to Firearms: NO        -Guardian: NO        -Trauma: NO              Family History:     Family History   Problem Relation Age of Onset   ??? Bipolar disorder Mother    ??? Depression Sister    ??? Alcohol  abuse Sister    ??? Alcohol abuse Maternal Aunt    ??? Melanoma Neg Hx    ??? Basal cell carcinoma Neg Hx    ??? Squamous cell carcinoma Neg Hx      Current Providers   Patient Care Team:  Ronin Rehfeldt Joeseph Amor, MD as PCP - General (Internal Medicine)  Lorelle Gibbs, MD as Attending Provider (Psychiatry)  Augustin Coupe, MD as Resident (Psychiatry)        Objective:   Vital Signs  BP 146/70 (BP Site: L Arm, BP Position: Sitting, BP Cuff Size: Medium)  - Pulse 65  - Ht 179 cm (5' 10.47)  - Wt 87.8 kg (193 lb 8 oz)  - SpO2 97%  - BMI 27.39 kg/m??    Exam  Constitutional: Well developed, well nourished, in no acute distress.   Head/Ears/Eyes/Nose/Throat: Normocephalic and atraumatic. Pupils equal, round, reactive to light and accomodation. Ear canal free of cerumen and debris, tmpanic membrane well visualized and normal. Oropharynx without redness or exudate. Dentition and gums healthy.   Neck: No lymphandenopathy or thyromegaly. No carotid bruits. No JVD.   Cardiovascular: RRR. No murmurs, rubs, or gallops. PMI not displaced. No thrills, lifts, or heaves. DP/PT/Radials 2+ and equal bilaterally.   Respiratory: Normal respiratory effort with symmetrical lung expansion. Clear to auscultation. Respirations unlabored.   Skin: Skin is warm, moist, soft, elastic, and without edema, discoloration, or lesions.   GI: Soft, non-tender, non-distended. No obvious masses. No hepatosplenomegaly. Normal active bowel sounds all four quadrants.  Musculoskeletal/extremities: Muscle strength and tone normal. No atrophy or abnormal movements noted. No edema, clubbing or cyanosis.  Hematologic: No signs of bleeding or excessive bruising.  Neurologic: Awake, alert, oriented to person, place and time. Reflexes 2+ and symmetric. Nonfocal examination.    Psychiatric: Mood and affect appropriate.   PHQ-2 Score:      PHQ-9 Score: 6    Edinburgh Score:      Screening complete, depression identified / today's follow-up action documented in note      Note - This record has been created using [employer]. Chart creation errors have been sought, but may not always have been located. Such creation errors do not reflect on the standard of medical care.

## 2020-10-01 NOTE — Unmapped (Signed)
Spoke with patient, informed him of below.  I suspect they are also counting the PT and OT visits that he had when he was in acute inpatient rehab in July.  Patient states he feels that he is slowly getting stronger and is now even taking a few steps without a cane.  He continues to be sober and work on his exercises daily.  I encouraged him to keep the appointment with an internal medicine PCP who is closer to his home and easier to get to in person.  He is welcome to schedule back with me at [facility] for primary care when he is physically strong enough to make the journey on public transportation.

## 2020-10-01 NOTE — Unmapped (Signed)
-----   Message from Pecola Leisure, RN sent at 09/20/2020  3:15 PM EDT -----  Hi Dr. Cathie Hoops  I spoke with Marchelle Folks the therapist that was handling his PT services. This patient has medicaid and they only allow 27 therapy visits per year combined with OT and PT. He has exhausted all of his therapy visits for the year. I also spoke with the billing dept and she said that also includes outpatient therapy as well. Just wanted to give you a follow up.   Thank you  Marsh Dolly RN [facility]

## 2020-10-02 ENCOUNTER — Ambulatory Visit
Admit: 2020-10-02 | Discharge: 2020-10-03 | Payer: MEDICAID | Attending: Geriatric Medicine | Primary: Geriatric Medicine

## 2020-10-02 DIAGNOSIS — Z9189 Other specified personal risk factors, not elsewhere classified: Secondary | ICD-10-CM | POA: Insufficient documentation

## 2020-10-02 DIAGNOSIS — L4050 Arthropathic psoriasis, unspecified: Principal | ICD-10-CM

## 2020-10-02 DIAGNOSIS — F1021 Alcohol dependence, in remission: Principal | ICD-10-CM

## 2020-10-02 DIAGNOSIS — F319 Bipolar disorder, unspecified: Principal | ICD-10-CM

## 2020-10-02 DIAGNOSIS — Z7185 Vaccine counseling: Principal | ICD-10-CM

## 2020-10-02 DIAGNOSIS — I10 Essential (primary) hypertension: Principal | ICD-10-CM

## 2020-10-02 DIAGNOSIS — R296 Repeated falls: Principal | ICD-10-CM

## 2020-10-02 DIAGNOSIS — D539 Nutritional anemia, unspecified: Principal | ICD-10-CM

## 2020-10-02 DIAGNOSIS — R195 Other fecal abnormalities: Principal | ICD-10-CM

## 2020-10-02 DIAGNOSIS — F17200 Nicotine dependence, unspecified, uncomplicated: Principal | ICD-10-CM

## 2020-10-02 DIAGNOSIS — I480 Paroxysmal atrial fibrillation: Principal | ICD-10-CM

## 2020-10-02 DIAGNOSIS — E871 Hypo-osmolality and hyponatremia: Principal | ICD-10-CM

## 2020-10-02 DIAGNOSIS — R569 Unspecified convulsions: Principal | ICD-10-CM

## 2020-10-02 DIAGNOSIS — M48061 Spinal stenosis, lumbar region without neurogenic claudication: Principal | ICD-10-CM

## 2020-10-02 DIAGNOSIS — D696 Thrombocytopenia, unspecified: Principal | ICD-10-CM

## 2020-10-02 LAB — CBC W/ AUTO DIFF
BASOPHILS ABSOLUTE COUNT: 0 10*9/L (ref 0.0–0.1)
BASOPHILS RELATIVE PERCENT: 0.7 %
EOSINOPHILS ABSOLUTE COUNT: 0.1 10*9/L (ref 0.0–0.5)
EOSINOPHILS RELATIVE PERCENT: 2.1 %
HEMATOCRIT: 37.8 % — ABNORMAL LOW (ref 39.0–48.0)
HEMOGLOBIN: 13.9 g/dL (ref 12.9–16.5)
LYMPHOCYTES ABSOLUTE COUNT: 1.3 10*9/L (ref 1.1–3.6)
LYMPHOCYTES RELATIVE PERCENT: 19.9 %
MEAN CORPUSCULAR HEMOGLOBIN CONC: 36.7 g/dL — ABNORMAL HIGH (ref 32.0–36.0)
MEAN CORPUSCULAR HEMOGLOBIN: 36.3 pg — ABNORMAL HIGH (ref 25.9–32.4)
MEAN CORPUSCULAR VOLUME: 98.9 fL — ABNORMAL HIGH (ref 77.6–95.7)
MEAN PLATELET VOLUME: 8.1 fL (ref 6.8–10.7)
MONOCYTES ABSOLUTE COUNT: 0.6 10*9/L (ref 0.3–0.8)
MONOCYTES RELATIVE PERCENT: 8.3 %
NEUTROPHILS ABSOLUTE COUNT: 4.6 10*9/L (ref 1.8–7.8)
NEUTROPHILS RELATIVE PERCENT: 69 %
PLATELET COUNT: 273 10*9/L (ref 150–450)
RED BLOOD CELL COUNT: 3.82 10*12/L — ABNORMAL LOW (ref 4.26–5.60)
RED CELL DISTRIBUTION WIDTH: 13.6 % (ref 12.2–15.2)
WBC ADJUSTED: 6.6 10*9/L (ref 3.6–11.2)

## 2020-10-02 LAB — COMPREHENSIVE METABOLIC PANEL
ALBUMIN: 4.1 g/dL (ref 3.4–5.0)
ALKALINE PHOSPHATASE: 74 U/L (ref 46–116)
ALT (SGPT): 13 U/L (ref 10–49)
ANION GAP: 3 mmol/L — ABNORMAL LOW (ref 5–14)
AST (SGOT): 21 U/L (ref <=34)
BILIRUBIN TOTAL: 0.4 mg/dL (ref 0.3–1.2)
BLOOD UREA NITROGEN: 9 mg/dL (ref 9–23)
BUN / CREAT RATIO: 11
CALCIUM: 9.9 mg/dL (ref 8.7–10.4)
CHLORIDE: 93 mmol/L — ABNORMAL LOW (ref 98–107)
CO2: 28 mmol/L (ref 20.0–31.0)
CREATININE: 0.79 mg/dL
EGFR CKD-EPI (2021) MALE: >90 mL/min/{1.73_m2} (ref >=60)
GLUCOSE RANDOM: 90 mg/dL (ref 70–179)
POTASSIUM: 4.9 mmol/L (ref 3.5–5.1)
PROTEIN TOTAL: 7.8 g/dL (ref 5.7–8.2)
SODIUM: 124 mmol/L — ABNORMAL LOW (ref 135–145)

## 2020-10-02 LAB — IRON & TIBC
IRON SATURATION: 25 % (ref 20–55)
IRON: 73 ug/dL
TOTAL IRON BINDING CAPACITY: 296 ug/dL (ref 250–425)

## 2020-10-02 LAB — TSH: THYROID STIMULATING HORMONE: 0.538 u[IU]/mL — ABNORMAL LOW (ref 0.550–4.780)

## 2020-10-02 LAB — RETICULOCYTES
RETICULOCYTE ABSOLUTE COUNT: 38.4 10*9/L (ref 23.0–100.0)
RETICULOCYTE COUNT PCT: 1 % (ref 0.50–2.17)

## 2020-10-02 LAB — FOLATE: FOLATE: >24 ng/mL (ref >=5.4)

## 2020-10-02 LAB — VITAMIN B12: VITAMIN B-12: 397 pg/mL (ref 211–911)

## 2020-10-02 LAB — FERRITIN: FERRITIN: 63.5 ng/mL

## 2020-10-02 MED ORDER — NICOTINE 21 MG/24 HR DAILY TRANSDERMAL PATCH
MEDICATED_PATCH | TRANSDERMAL | 5 refills | 28 days | Status: CP
Start: 2020-10-02 — End: 2020-11-13

## 2020-10-02 NOTE — Unmapped (Signed)
We have entered a referral for you today.  Please call the [facility] office at [phone] to schedule a time for this appointment.

## 2020-10-02 NOTE — Unmapped (Signed)
Last drink over 6 months ago.  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  Now that on seizure medication no recent seizure.  MRI with small vessel ischemic changes and volume loss.  Also had very low sodium during last admission in July.  Needs sodium rechecked.      Wife at work

## 2020-10-02 NOTE — Unmapped (Signed)
Seeing orthopedics.  Recently seen for bone density to look for osteoporosis.  Every thing came back normal per patient.  Fractured vertebrae and compressed vertebrae.  Has been doing physical therapy.  Cannot work because cannot drive.      Weight about at baseline    Continues to smoke.      Everyday Smoker   1ppd  Fagerstrom Test for Nicotine Dependence:  Time after waking to smoke first cigarette - 31-60 minutesm (1)  Find it difficult to refrain from smoking when it is forbidden - No (0)  Cigarette hate to give up most - the first in the morning (1)  Number of cigarettes smoked per day - 11-20 (1)  Smoke more frequently in first hours of awakening - No (0)  Still smoke if so ill that you are in bed most of the day - No (0)    medium dependence (5)    Previous quit attempts yes but just when in hospital and did not have a choice.   Reason(s) for continuing to smoke: habit and likes it  Reason(s) for wanting to quit smoking: health  Reason (s) for difficulty quitting in the past: habit  Barrier(s) to quitting now: spouse/partner smokes  Triggers or situations for smoking:habit    Plan:   preparation    is ready to quit smoking. Quit date set for monday    Counseled to stop smoking. Discussed methods for quitting including cold [place], nicotine replacement products, and prescription medications. Patient wlll consider further.    call the 1-800-quit program or 1-800-QUIT-NOW     Patches ordered.      have an open conversation with your spouse/partner that this is a very important part of your health and ask for their support

## 2020-10-02 NOTE — Unmapped (Signed)
On humira stable sx.

## 2020-10-02 NOTE — Unmapped (Addendum)
Single episode.  Patient advised asa 81mg .  Very high fall risk.  Advised patient continue diltiazem as recommended by previous physician.

## 2020-10-03 DIAGNOSIS — R7989 Other specified abnormal findings of blood chemistry: Principal | ICD-10-CM

## 2020-10-03 DIAGNOSIS — G40909 Epilepsy, unspecified, not intractable, without status epilepticus: Principal | ICD-10-CM

## 2020-10-03 DIAGNOSIS — E871 Hypo-osmolality and hyponatremia: Principal | ICD-10-CM

## 2020-10-03 LAB — T4, FREE: FREE T4: 1.34 ng/dL (ref 0.89–1.76)

## 2020-10-03 NOTE — Unmapped (Addendum)
Patient was previously on Norvasc and lisinopril however was noncompliant with both.  Subsequently was supposed to be placed on diltiazem.  Unclear what exactly the patient is taking.  We will need to call patient when she is at home with his actual medications.  Per pharmacy diltiazem was supposed be picked up in August however was not picked up until last week.  given patient's history of paroxysmal atrial fibrillation diltiazem would be a good choice especially if it can control his rate as well as his blood pressure.  Blood pressure looks fine in clinic today.Marland Kitchen

## 2020-10-03 NOTE — Unmapped (Signed)
Patient's sodium is very low likely because he is not taking his sodium supplements.   Please contact patient and ask him to restart sodium supplements three times per day.

## 2020-10-03 NOTE — Unmapped (Signed)
Continue supplementation of folic acid and thiamine.  Patient may also take a general multivitamin.  Continue naltrexone.  Continue supporting patient's abstinence.

## 2020-10-03 NOTE — Unmapped (Signed)
Check sodium level and basic chemistry

## 2020-10-03 NOTE — Unmapped (Addendum)
Patient has multiple issues complicating medication compliance.  First of all he has 3 different pharmacies dispensing medications.  We will need to address simplification of patient's pharmacies and medications.  He will need to bring his medications to each and every visit.  Will need to contact wife in the morning.  Hopeful she will have a list of his medications at that time so we can compare what he supposed to be taking to what he actually is taking at home.  Patient is on several psychotropic drugs with sedating side effects of the combination which puts him at high risk for adverse drug reaction.

## 2020-10-03 NOTE — Unmapped (Signed)
Patient wanted a letter to recommend reinstitution of driving.  Given patient's recent seizures I do not believe it is safe for patient to start driving.  I will leave it up to neurology to make recommendations on when may consider patient is safe and cleared from seizure restrictions on driving.

## 2020-10-03 NOTE — Unmapped (Signed)
Plan:   preparation    is ready to quit smoking. Quit date set for Today    Counseled to stop smoking. Discussed methods for quitting including cold [place], nicotine replacement products, and prescription medications. Patient wlll consider further. Patient is aware of the risks to health, long-term cost, and decreased quality of life while using tobacco products. Understands that tobacco use may affect the health of other family members or loved ones.    try a distraction for at least 5 minutes when I want a cigarette    have an open conversation with your spouse/partner that this is a very important part of your health and ask for their support

## 2020-10-03 NOTE — Unmapped (Addendum)
And need to clarify patient's medication regimen.  He does have follow-up with psychiatry October 3.  Appreciate plans to institute Lamictal as cutting back on Depakote.  Need to clarify whether patient is taking Zyprexa or Vraylar.  Per last note from psychiatry patient is taking Zyprexa 20 mg p.o. daily however patient's wife says he is taking 30 mg p.o. daily.  For some reason Leafy Kindle is also on patient's medication list.  He should not be on both.

## 2020-10-04 MED FILL — FLUTICASONE PROPIONATE 50 MCG/ACTUATION NASAL SPRAY,SUSPENSION: NASAL | 30 days supply | Qty: 16 | Fill #1

## 2020-10-04 MED FILL — HUMIRA SYRINGE CITRATE FREE 40 MG/0.4 ML: 28 days supply | Qty: 2 | Fill #1

## 2020-10-04 MED FILL — DILTIAZEM ER 360 MG CAPSULE,24 HR,EXTENDED RELEASE: ORAL | 30 days supply | Qty: 30 | Fill #1

## 2020-10-04 NOTE — Unmapped (Signed)
error 

## 2020-10-05 LAB — LAMOTRIGINE LEVEL: LAMOTRIGINE LEVEL: 2.6 ug/mL

## 2020-10-09 LAB — METHYLMALONIC ACID, SERUM: METHYLMALONIC ACID: 0.13 nmol/mL

## 2020-10-09 MED ORDER — LAMOTRIGINE 100 MG TABLET
ORAL_TABLET | Freq: Every day | ORAL | 1 refills | 30 days | Status: CP
Start: 2020-10-09 — End: 2020-12-08

## 2020-10-09 MED ORDER — LAMOTRIGINE 25 MG TABLET
ORAL_TABLET | Freq: Every day | ORAL | 1 refills | 30 days | Status: CN
Start: 2020-10-09 — End: 2020-12-08

## 2020-10-09 MED ORDER — OMEPRAZOLE 20 MG CAPSULE,DELAYED RELEASE
ORAL_CAPSULE | 0 refills | 0 days
Start: 2020-10-09 — End: ?

## 2020-10-09 NOTE — Unmapped (Signed)
Patient contacted the office and left a message requesting refills.

## 2020-10-09 NOTE — Unmapped (Signed)
Called patient to discuss medication regimen changes after most recent labs indicated recurrent hyponatremia. Will plan to continue cross-taper from Depakote to Lamictal. Will increase Lamictal from 75 mg to 100 mg daily and decrease Depakote from 500 mg to 250 mg. At next visit 10/3, will evaluate further and consider stopping Depakote with further titration of Lamictal as long as patient remains free of seizures and recurrent manic symptoms.

## 2020-10-14 MED ORDER — OMEPRAZOLE 20 MG CAPSULE,DELAYED RELEASE
ORAL_CAPSULE | 0 refills | 0 days
Start: 2020-10-14 — End: ?

## 2020-10-14 NOTE — Unmapped (Signed)
Pharmacy contacted the clinic on behalf of the patient. Pharmacy verified. Nicholas Rush

## 2020-10-15 MED ORDER — OMEPRAZOLE 20 MG CAPSULE,DELAYED RELEASE
ORAL_CAPSULE | Freq: Every day | ORAL | 0 refills | 90.00000 days | Status: CP
Start: 2020-10-15 — End: ?
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
## 2020-10-21 ENCOUNTER — Telehealth
Admit: 2020-10-21 | Discharge: 2020-10-22 | Payer: MEDICAID | Attending: Student in an Organized Health Care Education/Training Program | Primary: Student in an Organized Health Care Education/Training Program

## 2020-10-21 DIAGNOSIS — F1021 Alcohol dependence, in remission: Principal | ICD-10-CM

## 2020-10-21 DIAGNOSIS — F319 Bipolar disorder, unspecified: Principal | ICD-10-CM

## 2020-10-21 MED ORDER — NALTREXONE 50 MG TABLET
ORAL_TABLET | Freq: Every day | ORAL | 0 refills | 90 days | Status: CP
Start: 2020-10-21 — End: ?

## 2020-10-21 MED ORDER — LAMOTRIGINE 100 MG TABLET
ORAL_TABLET | Freq: Every day | ORAL | 0 refills | 90 days | Status: CP
Start: 2020-10-21 — End: 2021-01-19

## 2020-10-21 MED ORDER — LAMOTRIGINE 25 MG TABLET
ORAL_TABLET | Freq: Every day | ORAL | 1 refills | 90 days | Status: CP
Start: 2020-10-21 — End: 2021-04-19

## 2020-10-21 MED ORDER — OLANZAPINE 20 MG TABLET
ORAL_TABLET | Freq: Every evening | ORAL | 0 refills | 90 days | Status: CP
Start: 2020-10-21 — End: 2021-01-19

## 2020-10-21 NOTE — Unmapped (Signed)
It was great to see you today.   Medication changes -> Starting 10/28/20, STOP taking Depakote and increase Lamictal to 125 mg daily (one 100 mg tablet + one 25 mg tablet). Then, starting 11/11/20, increase Lamictal again to 150 mg daily (one 100 mg tablet + two 25 mg tablets).   I will see you for follow-up on 12/02/20 at 10:45 AM.     Follow-up instructions:  -- Please continue taking your medications as prescribed for your mental health.   -- Do not make changes to your medications, including taking more or less than prescribed, unless under the supervision of your physician. Be aware that some medications may make you feel worse if abruptly stopped  -- Please refrain from using illicit substances, as these can affect your mood and could cause anxiety or other concerning symptoms.   -- Seek further medical care for any increase in symptoms or new symptoms such as thoughts of wanting to hurt yourself or hurt others.     Contact info:  Life-threatening emergencies: call 911 or go to the nearest ER for medical or psychiatric attention.     Issues that need urgent attention but are not life threatening: call the clinic at [phone].    Non-urgent routine concerns, questions, and refill requests: please call the clinic for assistance, or you may call my voicemail at [phone], and I will reply within 2 business days.     Regarding appointments:  - If you need to cancel your appointment, we ask that you call [phone] at least 24 hours before your scheduled appointment.  - If for any reason you arrive 15 minutes later than your scheduled appointment time, you may not be seen and your visit may be rescheduled.  - Please remember that we will not automatically reschedule missed appointments.  - If you miss two (2) appointments without letting us know at least 24 hours in advance, you will likely be referred to a provider in your community.  - We will do our best to be on time. Sometimes an emergency will arise that might cause your clinician to be late. We will try to inform you of this when you check in for your appointment. If you wait more than 15 minutes past your appointment time without such notice, please speak with the front desk staff.    In the event of bad weather, the clinic staff will attempt to contact you, should your appointment need to be rescheduled. Additionally, you can call the Patient Weather Line [phone] for system-wide clinic status    For more information and reminders regarding clinic policies (these were provided when you were admitted to the clinic), please ask the front desk.

## 2020-10-24 ENCOUNTER — Telehealth: Admit: 2020-10-24 | Discharge: 2020-10-25 | Payer: MEDICAID

## 2020-10-24 DIAGNOSIS — G8929 Other chronic pain: Principal | ICD-10-CM

## 2020-10-24 DIAGNOSIS — I480 Paroxysmal atrial fibrillation: Principal | ICD-10-CM

## 2020-10-24 DIAGNOSIS — M545 Chronic midline low back pain without sciatica: Principal | ICD-10-CM

## 2020-10-24 DIAGNOSIS — E871 Hypo-osmolality and hyponatremia: Principal | ICD-10-CM

## 2020-10-24 DIAGNOSIS — F17200 Nicotine dependence, unspecified, uncomplicated: Principal | ICD-10-CM

## 2020-10-24 MED ORDER — DILTIAZEM ER 360 MG CAPSULE,24 HR,EXTENDED RELEASE
ORAL_CAPSULE | ORAL | 1 refills | 90 days | Status: CP
Start: 2020-10-24 — End: ?

## 2020-10-24 NOTE — Unmapped (Signed)
[facility] - [facility]  Family Medicine  Established Patient Video Visit Note  This medical encounter was conducted virtually using [email or web address]  TeleHealth protocols.        The patient reports they are currently: at home. I spent 22 minutes on the real-time audio and video with the patient on the date of service. I spent an additional 10 minutes on pre- and post-visit activities on the date of service.     The patient was physically located in [place] or a state in which I am permitted to provide care. The patient and/or parent/guardian understood that s/he may incur co-pays and cost sharing, and agreed to the telemedicine visit. The visit was reasonable and appropriate under the circumstances given the patient's presentation at the time.    The patient and/or parent/guardian has been advised of the potential risks and limitations of this mode of treatment (including, but not limited to, the absence of in-person examination) and has agreed to be treated using telemedicine. The patient's/patient's family's questions regarding telemedicine have been answered.     If the visit was completed in an ambulatory setting, the patient and/or parent/guardian has also been advised to contact their provider???s office for worsening conditions, and seek emergency medical treatment and/or call 911 if the patient deems either necessary.      Assessment/Plan:      Problem List Items Addressed This Visit        Cardiovascular and Mediastinum    Paroxysmal atrial fibrillation (CMS-HCC) - Primary     Recent diagnosis, presented in afib w RVR, asx, thought to be due to alcohol withdrawal  Echo completed 5/5, limited but normal LVEF   chads2vasc score = 1 for h/o HTN, low-mod risk for stroke  Advised to start OTC aspirin 81mg   Adherent to Diltiazem 360mg  24hr capsule once daily.  Denies palpitations  Last 3 Home BP Readings Systolic Diastolic   10/24/2020 143 82   01/24/1094 122 98   08/29/2020 133 71 Relevant Medications    diltiazem (TIAZAC) 360 MG 24 hr capsule       Other    Hyponatremia (Chronic)     Recent Na level was quite low at 124.  Patient reports taking Sodium Chloride 1gram tablet TID with no missed doses. Denies LE edema or recent AMS.   Encouraged to take a 4th dose at night if he wakes up or remembers.   - keep follow-up appt with Nephrology next week-- They can recheck level at their office  - Psychiatry working to cross taper from depakote to Lamictal in hopes of improving Hyponatremia         Tobacco use disorder (Chronic)     Smoking 1ppd.  Precontemplative to cut back. Not ready to quit.   Wants to focus on maintaining sobriety for now  Encouraged to consider gradual reduction         Chronic midline low back pain     Co-managed with [facility].  Currently on gabapentin 300mg  BID  Mobility most limited by his back pain.  Feels acute pain and mild stiffness upon standing, takes a while to warm up to get going.  Worse in the morning.   - has used up all PT visits for the year per Medicaid  - now walking a few steps unassisted but still uses rollator walker in the home  - planning ESI with [facility]  Followup:  Return in 4 weeks (on 11/21/2020). video visit      Preventive Care  Health Maintenance Due   Topic Date Due   ??? DTaP/Tdap/Td Vaccines (2 - Td or Tdap) 02/21/2019   ??? FIT-DNA Stool Test  06/17/2019   ??? Zoster Vaccines (2 of 2) 07/27/2020   ??? COVID-19 Vaccine (4 - Booster for Moderna series) 08/12/2020       I personally spent 31 minutes face-to-face and non-face-to-face in the care of this patient, which includes all pre, intra, and post visit time on the date of service.    Future Appointments   Date Time Provider Department Center   10/30/2020 12:30 PM [name], DO [facility]   11/20/2020  1:00 PM Truddie Hidden, MD [facility]   11/21/2020  3:30 PM Darnella Zeiter Doreatha Lew, MD [facility]   12/02/2020 10:45 AM Augustin Coupe, MD [facility]        Subjective:   CC:   Chief Complaint   Patient presents with   ??? Chronic Condition Follow-Up       Patient Care Team:  Nurum Joeseph Amor, MD as PCP - General (Internal Medicine)  Lorelle Gibbs, MD as Attending Provider (Psychiatry)  Augustin Coupe, MD as Resident (Psychiatry)    HPI  Nicholas Rush is a 55 y.o. male requesting a video visit to discuss the following issues:    PTHomeBP  Weight today is 190  [facility] - ulnar nerve surgery before thanksgiving.  They are also considering ESI for chronic low back pain - in the process of scheduling  Main reason for limited mobility is chronic low back pain.     For details, pls see A/P    BP Readings from Last 3 Encounters:   10/02/20 146/70   09/09/20 99/51   09/02/20 100/60       Wt Readings from Last 3 Encounters:   10/02/20 87.8 kg (193 lb 8 oz)   09/09/20 89.8 kg (198 lb)   08/16/20 88.8 kg (195 lb 12.3 oz)       BMI Readings from Last 3 Encounters:   10/02/20 27.39 kg/m??   09/09/20 26.85 kg/m??   08/16/20 25.83 kg/m??         ROS  As per HPI.       HISTORY  I have reviewed the patient's problem list, current medications, and allergies and have updated/reconciled them as needed.    Nicholas Rush  reports that he has been smoking cigarettes. He has a 45.00 pack-year smoking history. He has never used smokeless tobacco.       Objective:     General: Well appearing , in no acute distress  Skin: Color is normal. No rashes.  Psych: mildly slowed affect, normal mood  Resp: Normal work of breathing, no retractions    [facility]  [address] [place], [place] [address] ??? Telephone [phone] ??? Fax [phone]

## 2020-10-24 NOTE — Unmapped (Addendum)
Smoking 1ppd.  Precontemplative to cut back. Not ready to quit.   Wants to focus on maintaining sobriety for now  Encouraged to consider gradual reduction

## 2020-10-25 NOTE — Unmapped (Signed)
Co-managed with [facility].  Currently on gabapentin 300mg  BID  Mobility most limited by his back pain.  Feels acute pain and mild stiffness upon standing, takes a while to warm up to get going.  Worse in the morning.   - has used up all PT visits for the year per Medicaid  - now walking a few steps unassisted but still uses rollator walker in the home  - planning ESI with [facility]

## 2020-10-25 NOTE — Unmapped (Signed)
Recent Na level was quite low at 124.  Patient reports taking Sodium Chloride 1gram tablet TID with no missed doses. Denies LE edema or recent AMS.   Encouraged to take a 4th dose at night if he wakes up or remembers.   - keep follow-up appt with Nephrology next week-- They can recheck level at their office  - Psychiatry working to cross taper from depakote to Lamictal in hopes of improving Hyponatremia

## 2020-10-25 NOTE — Unmapped (Signed)
Recent diagnosis, presented in afib w RVR, asx, thought to be due to alcohol withdrawal  Echo completed 5/5, limited but normal LVEF   chads2vasc score = 1 for h/o HTN, low-mod risk for stroke  Advised to start OTC aspirin 81mg   Adherent to Diltiazem 360mg  24hr capsule once daily.  Denies palpitations  Last 3 Home BP Readings Systolic Diastolic   10/24/2020 143 82   01/24/1094 122 98   08/29/2020 133 71

## 2020-10-29 NOTE — Unmapped (Signed)
The [facility] has made a second and final attempt to reach this patient to refill the following medication:Humira.      We have left voicemails on the following phone numbers: [phone] and [phone] and have sent a MyChart message.    Dates contacted: 10/6 and 10/11  Last scheduled delivery: 9/16    The patient may be at risk of non-compliance with this medication. The patient should call the [facility] at [phone]  Option 4, then Option 2 (all other specialty patients) to refill medication.    Florrie Ramires D Administrator Shared [facility] Specialty Technician

## 2020-10-30 ENCOUNTER — Ambulatory Visit
Admit: 2020-10-30 | Discharge: 2020-10-30 | Payer: MEDICAID | Attending: Student in an Organized Health Care Education/Training Program | Primary: Student in an Organized Health Care Education/Training Program

## 2020-10-30 DIAGNOSIS — E871 Hypo-osmolality and hyponatremia: Principal | ICD-10-CM

## 2020-10-30 LAB — BASIC METABOLIC PANEL
ANION GAP: 6 mmol/L (ref 5–14)
BLOOD UREA NITROGEN: 10 mg/dL (ref 9–23)
BUN / CREAT RATIO: 13
CALCIUM: 10 mg/dL (ref 8.7–10.4)
CHLORIDE: 97 mmol/L — ABNORMAL LOW (ref 98–107)
CO2: 29.5 mmol/L (ref 20.0–31.0)
CREATININE: 0.75 mg/dL
EGFR CKD-EPI (2021) MALE: >90 mL/min/{1.73_m2} (ref >=60)
GLUCOSE RANDOM: 92 mg/dL (ref 70–179)
POTASSIUM: 4.6 mmol/L (ref 3.4–4.8)
SODIUM: 132 mmol/L — ABNORMAL LOW (ref 135–145)

## 2020-10-30 LAB — POTASSIUM, URINE, RANDOM: POTASSIUM URINE: 31.5 mmol/L

## 2020-10-30 LAB — SODIUM, URINE, RANDOM: SODIUM URINE: 97 mmol/L

## 2020-10-30 LAB — OSMOLALITY, RANDOM URINE: OSMOLALITY URINE: 478 mosm/kg

## 2020-10-30 LAB — OSMOLALITY, SERUM: OSMOLALITY MEASURED: 281 mosm/kg (ref 275–295)

## 2020-10-30 NOTE — Unmapped (Addendum)
Hello Mr Nicholas Rush, thank you for coming to clinic today. Here are the following plans:    Please restrict your daily fluid intake to 50 ounces or less  Keep taking salt tablets at 1 gram three times daily  Please coming back to first floor lab at [facility] building in 1 week once you are off Depakote to check your blood and urine levels

## 2020-10-30 NOTE — Unmapped (Signed)
Referring Provider: Harlow Mares, MD     PCP: Harlow Mares, MD    10/31/2020    Chief Complaint: Hyponatremia    HPI:  [name] is a 55 y.o. male with chronic hyponatremia (baseline in low 130s), bipolar 1 disorder, alcohol abuse, HTN, psoriasis who is was seen in consultation at the request of Erdem, Mauricio Po, MD for evaluation of hyponatremia.     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     Patient then presented to [facility] on 02/22/20 with altered mental status secondary likely hyponatremia. His sodium was 111. Na corrected in first 24 hours from 111 to 119 which corrected very quickly in first few hours with 1 liter of NS in ED (urine osm 133 and urine 20 on 2/4) and required a couple hours of D5W for overcorrection. Na in next 24 hours from 119 to 126 with NS at 75 ml/hr (urine osm 314 and urine sodium 71). He also had some seizure like activity that may have been from hyponatremia vs alcohol withdrawal. His volume status was documented as being volume down. Since 2/5-2/15 his sodium remained in 126-130 range and patient had been fluid restricted to 1.5 Liters (urine osm 649 and urine sodium 110) and his current volume status was euvolemic. He takes Olanzapine and Depakote for his bipolar disorder. Nephrology consulted virtually on 2/15 for assistance with hyponatremia. CT scan of lung showed solitary pulmonary nodule. CTAP showed no signs of cancer. On discharge 03/24/20 patient sodium was 134. He stopped taking the salt tablets at 1 g TID, and sodium checked on 04/11/20 was 128, and since then patient has restarted taking the salt tablets.    Patient denied excessive water intake. In April 2022 she drank 2-3 shots of hard liquor daily. On depakote and Olanzapine.    Interval History:  On 125 mg daily of Lamictal now (increasing). Depakote is now being weaned off over past couple weeks. Drinks six 12 ounce cans of diet Mountain Dew. 16 ounces daily of water.     Denies fevers, chills, headaches, chest pain, shortness of breath, nausea, vomiting, abdominal pain, diarrhea, dysuria, hematuria, ulcers, arthralgias, syncope or seizures.     ROS:  11 systems reviewed and negative except those noted in the history of present illness    PAST MEDICAL HISTORY:  Past Medical History:   Diagnosis Date   ??? Alcohol withdrawal syndrome, with delirium (CMS-HCC) 05/11/2012   ??? Anxiety    ??? Bipolar 1 disorder (CMS-HCC)    ??? Depressive disorder    ??? Erectile dysfunction    ??? Esophageal reflux    ??? History of pyloric stenosis    ??? Hypertension    ??? Insomnia    ??? Psoriasis     Dermatologist - Dr. Deloria Lair in [facility]   ??? Seizure (CMS-HCC) 02/20/2019   ??? Tobacco use     1ppd       ALLERGIES  Lorazepam, Tramadol, and Hydroxyzine    SOCIAL HISTORY  Social History     Socioeconomic History   ??? Marital status: Married   ??? Number of children: 2   ??? Highest education level: Bachelor's degree (e.g., BA, AB, BS)   Tobacco Use   ??? Smoking status: Current Every Day Smoker  Packs/day: 1.50     Years: 30.00     Pack years: 45.00     Types: Cigarettes   ??? Smokeless tobacco: Never Used   Vaping Use   ??? Vaping Use: Never used   Substance and Sexual Activity   ??? Alcohol use: Yes     Comment: 3 drinks a day.   ??? Drug use: Yes     Types: Marijuana   Other Topics Concern   ??? Do you use sunscreen? Yes   ??? Tanning bed use? Yes   ??? Are you easily burned? Yes   ??? Excessive sun exposure? No   ??? Blistering sunburns? Yes   Social History Narrative    Updated 01/03/2020        Lives in [place] with wife and two children.  Currently unemployed had Worked at [employer].  Current smoker.History of excessive ETOh abuse has been sober since September 2021.Marland Kitchen           PSYCHIATRIC HX:     -Current provider(s):  [place] Dr. Roxanne Gates     -Suicide attempts/SIB: NO    -Psych Hospitalizations:  2014 for depression at [facility]    -Med compliance hx: Poor,      -Fa hx suicide: YES, sister attempted         SUBSTANCE ABUSE HX:     -Current using substance: sig hx of alcohol use (last drink Sept 2021) and was hospitalized for withdrawal with DT    -Hx w/d sxs: YES    -Sz Hx: YES,    -DT Hx:YES, Sept 2021        SOCIAL HX:    -Current living environment: lives in [facility] [place] with wife and 2 childrne     -Current support: wife, father     -Violence (perp): verbal, has been kicking walls     -Access to Firearms: NO        -Guardian: NO        -Trauma: NO             Social Determinants of Health     Financial Resource Strain: Medium Risk   ??? Difficulty of Paying Living Expenses: Somewhat hard   Food Insecurity: No Food Insecurity   ??? Worried About Running Out of Food in the Last Year: Never true   ??? Ran Out of Food in the Last Year: Never true   Transportation Needs: Unmet Transportation Needs   ??? Lack of Transportation (Medical): Yes   ??? Lack of Transportation (Non-Medical): Yes         FAMILY HISTORY  Family History   Problem Relation Age of Onset   ??? Bipolar disorder Mother    ??? Depression Sister    ??? Alcohol abuse Sister    ??? Alcohol abuse Maternal Aunt    ??? Melanoma Neg Hx    ??? Basal cell carcinoma Neg Hx    ??? Squamous cell carcinoma Neg Hx         MEDICATIONS:  Current Outpatient Medications   Medication Sig Dispense Refill   ??? ADALIMUMAB SYRINGE CITRATE FREE 40 MG/0.4 ML Inject the contents of 1 syringe (40 mg) under the skin every 14 days 2 each 11   ??? cetirizine (ZYRTEC) 10 MG tablet Take 1 tablet (10 mg total) by mouth in the morning. 30 tablet 0   ??? diltiazem (TIAZAC) 360 MG 24 hr capsule Take 1 capsule (360 mg total) by mouth daily. This replaces amlodipine. 90 capsule 1   ???  divalproex ER (DEPAKOTE ER) 250 MG extended release 24 hr tablet Take 1 tablet (250 mg total) by mouth nightly. 90 tablet 0   ??? gabapentin (NEURONTIN) 300 MG capsule Take 1 capsule (300 mg total) by mouth Two (2) times a day. 60 capsule 5   ??? lamoTRIgine (LAMICTAL) 100 MG tablet Take 1 tablet (100 mg total) by mouth daily. Starting 10/28/20, take in addition to one 25 mg tablet for total dose of 125 mg daily. Starting 11/11/20, take in addition to two 25 mg tablets for total dose of 150 mg daily. 90 tablet 0   ??? magnesium oxide (MAG-OX) 400 mg (241.3 mg elemental magnesium) tablet Take 2 tablets (800 mg total) by mouth Two (2) times a day. 120 tablet 11   ??? melatonin 3 mg cap Take by mouth.     ??? naltrexone (DEPADE) 50 mg tablet Take 1 tablet (50 mg total) by mouth daily. 90 tablet 0   ??? OLANZapine (ZYPREXA) 20 MG tablet Take 1 tablet (20 mg total) by mouth nightly. 90 tablet 0   ??? sodium chloride 1 gram tablet Take 1 tablet (1 g total) by mouth Three (3) times a day with a meal. 100 tablet 2   ??? thiamine mononitrate, vit B1, 100 mg Tab tablet Take 1 tablet (100 mg total) by mouth in the morning. 90 tablet 0   ??? acetaminophen (TYLENOL) 500 MG tablet Take 500 mg by mouth every six (6) hours as needed for pain. (Patient not taking: Reported on 10/30/2020)     ??? aspirin (ECOTRIN) 81 MG tablet Take 1 tablet (81 mg total) by mouth daily. (Patient not taking: Reported on 10/30/2020) 30 tablet prn   ??? calcium-vitamin D (CALCIUM-VITAMIN D) 500 mg-5 mcg (200 unit) per tablet Take 1 tablet by mouth Three (3) times a day with a meal. (Patient not taking: Reported on 10/30/2020) 30 tablet 0   ??? clobetasoL (TEMOVATE) 0.05 % external solution Apply topically Two (2) times a day. As needed for itching and flaking. (Patient not taking: Reported on 10/30/2020) 50 mL 6   ??? ferrous sulfate 325 (65 FE) MG tablet FeroSul 325 mg (65 mg iron) tablet   TAKE 1 TABLET BY MOUTH DAILY (Patient not taking: Reported on 10/30/2020)     ??? fluticasone propionate (FLONASE) 50 mcg/actuation nasal spray Use 2 sprays in each nostril in the morning. (Patient not taking: Reported on 10/30/2020) 16 g 12 ??? folic acid (FOLVITE) 1 MG tablet Take 1 tablet (1 mg total) by mouth in the morning. (Patient not taking: Reported on 10/30/2020) 30 tablet 11   ??? lamoTRIgine (LAMICTAL) 25 MG tablet Take 1 tablet (25 mg total) by mouth daily. Starting 10/28/20, take one tablet in addition to 100 mg tablet for total dose of 125 mg daily. Then, starting 10/24/2, take two tablets in addition to 100 mg tablet for total dose of 150 mg daily. (Patient not taking: Reported on 10/30/2020) 90 tablet 1   ??? methocarbamoL (ROBAXIN) 750 MG tablet methocarbamol 750 mg tablet   TAKE 1 TABLET BY MOUTH THREE TIMES DAILY (Patient not taking: Reported on 10/30/2020)     ??? multivitamin,tx-iron-minerals (THERA-M) Tab Take 1 tablet by mouth daily. (Patient not taking: Reported on 10/30/2020) 30 tablet 0   ??? omeprazole (PRILOSEC) 20 MG capsule Take 1 capsule (20 mg total) by mouth daily. (Patient not taking: Reported on 10/30/2020) 90 capsule 0     No current facility-administered medications for this visit.  PHYSICAL EXAM:     Vitals:    10/30/20 1223   BP: 123/78   Pulse: 68   Temp: 36.2 ??C (97.1 ??F)        General: No acute distress. Alert and oriented X3  HEENT: [place]/AT. Anicteric. No cervical LAD. Moist mucous mucus membranes  Cardiac: Regular rate and rhythm. No murmurs or rubs  Pulmonary: Lungs clear to auscultation bilaterally. Speaking in full sentences. No accessory muscle use of breathing.   Abdomen: Soft, NT, ND.  Extremities: Left leg edema  Neuro: Moving all extremities. Sensation of UE and LE intact bilaterally.  Skin: No rashes or lesions.      MEDICAL DECISION MAKING    Results for orders placed or performed in visit on 10/30/20   Osmolality, Random Urine   Result Value Ref Range    Osmolality, Ur 478 Undefined mOsm/kg   Sodium, urine, random   Result Value Ref Range    Sodium, Ur 97 Undefined mmol/L   Potassium, urine, random   Result Value Ref Range    Potassium Urine 31.5 Undefined mmol/L   Basic metabolic panel   Result Value Ref Range    Sodium 132 (L) 135 - 145 mmol/L    Potassium 4.6 3.4 - 4.8 mmol/L    Chloride 97 (L) 98 - 107 mmol/L    CO2 29.5 20.0 - 31.0 mmol/L    Anion Gap 6 5 - 14 mmol/L    BUN 10 9 - 23 mg/dL    Creatinine 0.98 1.19 - 1.10 mg/dL    BUN/Creatinine Ratio 13     eGFR CKD-EPI (2021) Male >90 >=60 mL/min/1.90m2    Glucose 92 70 - 179 mg/dL    Calcium 14.7 8.7 - 82.9 mg/dL   Osmolality, Serum   Result Value Ref Range    Osmolality Meas 281 275 - 295 mOsm/kg   POCT Urinalysis Dipstick   Result Value Ref Range    Spec Gravity/POC 1.015 1.003 - 1.030    PH/POC 7.0 5.0 - 9.0    Leuk Esterase/POC Negative Negative    Nitrite/POC Negative Negative    Protein/POC Negative Negative    UA Glucose/POC Negative Negative    Ketones, POC Negative Negative    Bilirubin/POC Negative Negative    Blood/POC Negative Negative    Urobilinogen/POC 0.2 0.2 - 1.0 mg/dL        Creatinine   Date Value Ref Range Status   10/30/2020 0.75 0.60 - 1.10 mg/dL Final   56/21/3086 5.78 0.60 - 1.10 mg/dL Final   46/96/2952 8.41 0.60 - 1.10 mg/dL Final   32/44/0102 7.25 0.60 - 1.10 mg/dL Final   36/64/4034 7.42 0.60 - 1.10 mg/dL Final   59/56/3875 6.43 0.70 - 1.30 mg/dL Final   32/95/1884 1.66 0.70 - 1.30 mg/dL Final   07/19/1599 0.93 0.70 - 1.30 mg/dL Final     No results found for: GFR   BUN   Date Value Ref Range Status   10/30/2020 10 9 - 23 mg/dL Final   23/55/7322 9 9 - 23 mg/dL Final   02/54/2706 12 9 - 23 mg/dL Final   23/76/2831 9 9 - 23 mg/dL Final   51/76/1607 10 9 - 23 mg/dL Final   37/10/6267 12 7 - 21 mg/dL Final   48/54/6270 8 7 - 21 mg/dL Final      Phosphorus   Date Value Ref Range Status   08/21/2020 4.6 2.4 - 5.1 mg/dL Final   35/00/9381 4.7 2.4 - 5.1 mg/dL  Final   08/19/2020 4.8 2.4 - 5.1 mg/dL Final   27/78/2423 3.8 2.4 - 5.1 mg/dL Final   53/61/4431 4.5 2.4 - 5.1 mg/dL Final      Calcium   Date Value Ref Range Status   10/30/2020 10.0 8.7 - 10.4 mg/dL Final   54/00/8676 9.9 8.7 - 10.4 mg/dL Final   19/50/9326 9.1 8.7 - 10.4 mg/dL Final   71/24/5809 9.2 8.7 - 10.4 mg/dL Final   98/33/8250 8.8 8.7 - 10.4 mg/dL Final   53/97/6734 9.1 8.5 - 10.2 mg/dL Final   19/37/9024 9.2 8.5 - 10.2 mg/dL Final      No results found for: PTH   HGB   Date Value Ref Range Status   10/02/2020 13.9 12.9 - 16.5 g/dL Final   09/73/5329 92.4 12.9 - 16.5 g/dL Final   26/83/4196 22.2 (L) 12.9 - 16.5 g/dL Final   97/98/9211 94.1 (L) 12.9 - 16.5 g/dL Final   74/08/1446 18.5 (L) 12.9 - 16.5 g/dL Final   63/14/9702 63.7 13.5 - 17.5 g/dL Final   85/88/5027 74.1 13.5 - 17.5 g/dL Final      CO2   Date Value Ref Range Status   10/30/2020 29.5 20.0 - 31.0 mmol/L Final   10/02/2020 28.0 20.0 - 31.0 mmol/L Final   09/02/2020 23.2 20.0 - 31.0 mmol/L Final   08/21/2020 26.0 20.0 - 31.0 mmol/L Final   08/20/2020 25.0 20.0 - 31.0 mmol/L Final   06/07/2013 26 22 - 30 mmol/L Final   05/11/2012 20 (L) 22 - 30 mmol/L Final         IMAGING STUDIES:  -CTAP 03/14/20: Kidneys show unchanged subcentimeter right hypodensity, too small to characterize on CT. Symmetric enhancement kidneys. No hydronephrosis.      ASSESSMENT/PLAN:  Mr.Nicholas Rush is a 55 y.o. patient with chronic hyponatremia (baseline in low 130s), bipolar 1 disorder, alcohol abuse, HTN, psoriasis on Humira who is being seen for evaluation of chronic hyponatremia.    Chronic Hyponatremia w/ recent episode of acute on chronic hyponatremia  -Episode in February 2022 initially appears to have been volume down and improved with fluids. 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 CT lung scan and CTAP without signs of malignancy. CT head and prior MRI without malignant findings. 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  -Ensure UTD cancer screening per PCP  -Continue NaCl tablets at 1 g TID  -Limit to 50 ounces (1.5 L) of fluid daily (currently drinking about 90 ounces of fluid per day)  -Lamotrigine could also potentially cause SIADH, so will need to monitor Na levels closely  -Encouraged to avoid alcohol use    Mr.Nicholas Rush will follow up in 3 month    Patient was seen and discussed with Dr. Dorna Leitz who is in agreement with the plan.

## 2020-10-30 NOTE — Unmapped (Signed)
AOBP: right  arm large  cuff     1st reading:  127/81  66  2nd reading: 119/77  66  3rd reading: 123/77  71    Average:  123/78  68    97.1

## 2020-11-04 MED ORDER — MAGNESIUM OXIDE 400 MG (241.3 MG MAGNESIUM) TABLET
ORAL_TABLET | Freq: Two times a day (BID) | ORAL | 11 refills | 30 days | Status: CP
Start: 2020-11-04 — End: 2021-11-04

## 2020-11-04 NOTE — Unmapped (Signed)
Patient called and left a message stating that he is out of the medication below. He needs a refill if he is suppose to continue it.

## 2020-11-19 NOTE — Unmapped (Signed)
[facility] Refill Coordination Note    Specialty Medication(s) to be Shipped:   Inflammatory Disorders: Humira    Other medication(s) to be shipped: flonase nasal spray     Nicholas Rush, DOB: Apr 23, 1965  Phone: [phone] (home)       All above HIPAA information was verified with patient.     Was a Nurse, learning disability used for this call? No    Completed refill call assessment today to schedule patient's medication shipment from the [facility] [phone]).  All relevant notes have been reviewed.     Specialty medication(s) and dose(s) confirmed: Regimen is correct and unchanged.   Changes to medications: Nicholas Rush reports no changes at this time.  Changes to insurance: No  New side effects reported not previously addressed with a pharmacist or physician: None reported  Questions for the pharmacist: No    Confirmed patient received a Conservation officer, historic buildings and a Surveyor, mining with first shipment. The patient will receive a drug information handout for each medication shipped and additional FDA Medication Guides as required.       DISEASE/MEDICATION-SPECIFIC INFORMATION        For patients on injectable medications: Patient currently has 0 doses left.  Next injection is scheduled for 11/26/20.    SPECIALTY MEDICATION ADHERENCE     Medication Adherence    Patient reported X missed doses in the last month: 0  Specialty Medication: Humira 40mg /0.1ml  Patient is on additional specialty medications: No  Informant: patient              Were doses missed due to medication being on hold? No    REFERRAL TO PHARMACIST     Referral to the pharmacist: Not needed      [facility]     Shipping address confirmed in Epic.     Delivery Scheduled: Yes, Expected medication delivery date: 11/21/20.     Medication will be delivered via Same Day Courier to the prescription address in Epic WAM.    Nicholas Rush   [facility] Specialty Technician

## 2020-11-20 ENCOUNTER — Ambulatory Visit: Admit: 2020-11-20 | Discharge: 2020-11-21 | Payer: MEDICAID

## 2020-11-20 DIAGNOSIS — R569 Unspecified convulsions: Principal | ICD-10-CM

## 2020-11-20 DIAGNOSIS — F10939 Alcohol withdrawal syndrome with complication (CMS-HCC): Principal | ICD-10-CM

## 2020-11-20 DIAGNOSIS — R29898 Other symptoms and signs involving the musculoskeletal system: Principal | ICD-10-CM
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
## 2020-11-20 NOTE — Unmapped (Addendum)
1) continue Lamotrigine 150mg  twice a day    2) continue gabapentin 300mg  twice a day    3) please obtain bloodwork when able within next 1-2 weeks    4) obtain Ambulatory EEG at [facility] medical center.  They will call you to schedule.  If you haven't heard from them in 2 weeks, please call clinic.     5) Seizures may happen at any time. It is important to take certain precautions to maintain your safety.  When possible, take showers instead of baths, as it is possible to drown in even shallow water during a seizure. Do not swim unsupervised or in open water where rescue could be difficult. Do not climb to heights and do not operate heavy machinery. When cooking, use the back burners of the stove and avoid open flames or hot stove tops. Avoid any activities which could be dangerous in the event of a loss of consciousness.  This includes no driving until seizure free for 6 months.    6) Follow-up in 3 months

## 2020-11-21 ENCOUNTER — Telehealth: Admit: 2020-11-21 | Discharge: 2020-11-22 | Payer: MEDICAID

## 2020-11-21 DIAGNOSIS — I10 Essential (primary) hypertension: Principal | ICD-10-CM

## 2020-11-21 DIAGNOSIS — G8929 Other chronic pain: Principal | ICD-10-CM

## 2020-11-21 DIAGNOSIS — M545 Chronic midline low back pain without sciatica: Principal | ICD-10-CM

## 2020-11-21 MED ORDER — LISINOPRIL 10 MG TABLET
ORAL_TABLET | Freq: Every day | ORAL | 3 refills | 90 days | Status: CP
Start: 2020-11-21 — End: 2021-11-21

## 2020-11-21 MED FILL — HUMIRA SYRINGE CITRATE FREE 40 MG/0.4 ML: 28 days supply | Qty: 2 | Fill #2

## 2020-11-21 NOTE — Unmapped (Addendum)
Has home BP cuff.  See home BP readings, which are trending higher.   MyChart BP Review Systolic Diastolic Pulse   16/01/958 143 82 74   09/19/2020 122 98 73   08/29/2020 133 71 72   previously when he was actively drinking he was on lisinopril as well as amlodipine.  Currently only adherent to diltiazem for rate control  Last 3 Home BP Readings Systolic Diastolic   11/21/2020 153 92   45/04/979 143 82   09/19/2020 122 98   Agreed to restart lisinopril at 10 mg once daily  -Follow-up by video visit in 1 month

## 2020-11-21 NOTE — Unmapped (Signed)
[facility] - [place] [facility]  Family Medicine  Established Patient Video Visit Note  This medical encounter was conducted virtually using [email or web address]  [email or web address] protocols.        The patient reports they are currently: at home. I spent 22 minutes on the real-time audio and video with the patient on the date of service. I spent an additional 10 minutes on pre- and post-visit activities on the date of service.     The patient was physically located in [place] or a state in which I am permitted to provide care. The patient and/or parent/guardian understood that s/he may incur co-pays and cost sharing, and agreed to the telemedicine visit. The visit was reasonable and appropriate under the circumstances given the patient's presentation at the time.    The patient and/or parent/guardian has been advised of the potential risks and limitations of this mode of treatment (including, but not limited to, the absence of in-person examination) and has agreed to be treated using telemedicine. The patient's/patient's family's questions regarding telemedicine have been answered.     If the visit was completed in an ambulatory setting, the patient and/or parent/guardian has also been advised to contact their provider???s office for worsening conditions, and seek emergency medical treatment and/or call 911 if the patient deems either necessary.        Assessment/Plan:      Problem List Items Addressed This Visit        Cardiovascular and Mediastinum    Essential hypertension     Has home BP cuff.  See home BP readings, which are trending higher.   MyChart BP Review Systolic Diastolic Pulse   28/04/1322 143 82 74   09/19/2020 122 98 73   08/29/2020 133 71 72   previously when he was actively drinking he was on lisinopril as well as amlodipine.  Currently only adherent to diltiazem for rate control  Last 3 Home BP Readings Systolic Diastolic   11/21/2020 153 92   40/01/270 143 82   09/19/2020 122 98   Agreed to restart lisinopril at 10 mg once daily  -Follow-up by video visit in 1 month           Relevant Medications    lisinopriL (PRINIVIL,ZESTRIL) 10 MG tablet       Other    Bipolar 1 disorder (CMS-HCC)     Nicholas Rush notes that the swelling in his left lower leg has completely resolved since stopping Depakote.  He reports he can finally wear his regular shoes.  He reports sleeping 8 to 10 hours each night and doing quite well.  -His main goal is to maintain sobriety and stay seizure-free so that he can hopefully drive next year           Chronic midline low back pain     Co-managed with [facility].  Currently on gabapentin 300mg  BID  Mobility most limited by his back pain.  Feels acute pain and mild stiffness upon standing, takes a while to warm up to get going.  Worse in the morning.   - recently had ESI with [facility], they are planning another in 3 months  - has used up all PT visits for the year per Medicaid  - doing laps around the house daily with walker, and can walk a short distance with cane  - using a shower chair, independent ADLs  Followup:  Return in 6 weeks (on 01/02/2021).      Preventive Care  Health Maintenance Due   Topic Date Due   ??? DTaP/Tdap/Td Vaccines (2 - Td or Tdap) 02/21/2019   ??? FIT-DNA Stool Test  06/17/2019   ??? COVID-19 Vaccine (4 - Booster for Moderna series) 07/15/2020   ??? Zoster Vaccines (2 of 2) 07/27/2020   Needs to complete a colonoscopy to follow-up a positive stool test.  Today he states he was strongly wished to wait to complete the colonoscopy in March 2023, to give himself time to regain his strength, maintain sobriety, and improve his health.    I personally spent 30 minutes face-to-face and non-face-to-face in the care of this patient, which includes all pre, intra, and post visit time on the date of service.    Future Appointments   Date Time Provider Department Center   12/02/2020 10:45 AM Augustin Coupe, MD [facility]   02/12/2021  1:00 PM [name], DO [facility]   03/12/2021  3:00 PM Truddie Hidden, MD [facility]            Subjective:   CC:   Chief Complaint   Patient presents with   ??? Chronic Condition Follow-Up       Patient Care Team:  Nurum Joeseph Amor, MD as PCP - General (Internal Medicine)  Lorelle Gibbs, MD as Attending Provider (Psychiatry)  Augustin Coupe, MD as Resident (Psychiatry)  [name], DO as Resident (Nephrology)    HPI  [name] is a 55 y.o. male requesting a video visit to discuss the following issues:    Swelling totally resolved off Depakote  Now can wear regular shoes.     For details, pls see A/P    BP Readings from Last 3 Encounters:   11/20/20 140/91   10/30/20 123/78   10/02/20 146/70       Wt Readings from Last 3 Encounters:   11/20/20 82.6 kg (182 lb 3.2 oz)   10/30/20 88.4 kg (194 lb 12.8 oz)   10/02/20 87.8 kg (193 lb 8 oz)       BMI Readings from Last 3 Encounters:   11/20/20 25.79 kg/m??   10/30/20 27.58 kg/m??   10/02/20 27.39 kg/m??         ROS  As per HPI.       HISTORY  I have reviewed the patient's problem list, current medications, and allergies and have updated/reconciled them as needed.    [name]  reports that he has been smoking cigarettes. He has a 45.00 pack-year smoking history. He has never used smokeless tobacco.       Objective:     General: Well appearing , in no acute distress  Skin: Color is normal. No rashes.  Psych: Appropriate affect, normal mood  Resp: Normal work of breathing, no retractions    [facility]  [address], [place], [place] [address] ??? Telephone [phone] ??? Fax [phone]

## 2020-11-21 NOTE — Unmapped (Addendum)
Co-managed with [facility].  Currently on gabapentin 300mg  BID  Mobility most limited by his back pain.  Feels acute pain and mild stiffness upon standing, takes a while to warm up to get going.  Worse in the morning.   - recently had ESI with [facility], they are planning another in 3 months  - has used up all PT visits for the year per Medicaid  - doing laps around the house daily with walker, and can walk a short distance with cane  - using a shower chair, independent ADLs

## 2020-11-23 NOTE — Unmapped (Addendum)
Nicholas Rush notes that the swelling in his left lower leg has completely resolved since stopping Depakote.  He reports he can finally wear his regular shoes.  He reports sleeping 8 to 10 hours each night and doing quite well.  -His main goal is to maintain sobriety and stay seizure-free so that he can hopefully drive next year

## 2020-11-29 MED ORDER — OLANZAPINE 20 MG TABLET
ORAL_TABLET | Freq: Every evening | ORAL | 0 refills | 30 days | Status: CP
Start: 2020-11-29 — End: 2020-12-29

## 2020-11-29 NOTE — Unmapped (Signed)
Patient wife called stating she needs a refill of medication but prefers 30 mg tablets instead of 20 mg tablets and that the patient is all out of medication.
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
## 2020-12-02 ENCOUNTER — Telehealth
Admit: 2020-12-02 | Discharge: 2020-12-03 | Payer: MEDICAID | Attending: Student in an Organized Health Care Education/Training Program | Primary: Student in an Organized Health Care Education/Training Program

## 2020-12-02 DIAGNOSIS — F17200 Nicotine dependence, unspecified, uncomplicated: Principal | ICD-10-CM

## 2020-12-02 DIAGNOSIS — F1021 Alcohol dependence, in remission: Principal | ICD-10-CM

## 2020-12-02 DIAGNOSIS — F319 Bipolar disorder, unspecified: Principal | ICD-10-CM

## 2020-12-02 MED ORDER — OLANZAPINE 20 MG TABLET
ORAL_TABLET | Freq: Every evening | ORAL | 1 refills | 30.00000 days | Status: CP
Start: 2020-12-02 — End: 2021-01-31

## 2020-12-02 MED ORDER — LAMOTRIGINE 100 MG TABLET
ORAL_TABLET | Freq: Every day | ORAL | 2 refills | 30 days | Status: CP
Start: 2020-12-02 — End: 2021-03-02

## 2020-12-02 MED ORDER — NALTREXONE 50 MG TABLET
ORAL_TABLET | Freq: Every day | ORAL | 0 refills | 90 days | Status: CP
Start: 2020-12-02 — End: ?

## 2020-12-02 NOTE — Unmapped (Addendum)
It was great to see you today.   No medication changes. Continue to take Lamictal 150 mg daily, olanzapine 20 mg nightly, and naltrexone 50 mg daily.   I will see you for follow-up on 02/10/21 at 10:45 AM.     Follow-up instructions:  -- Please continue taking your medications as prescribed for your mental health.   -- Do not make changes to your medications, including taking more or less than prescribed, unless under the supervision of your physician. Be aware that some medications may make you feel worse if abruptly stopped  -- Please refrain from using illicit substances, as these can affect your mood and could cause anxiety or other concerning symptoms.   -- Seek further medical care for any increase in symptoms or new symptoms such as thoughts of wanting to hurt yourself or hurt others.     Contact info:  Life-threatening emergencies: call 911 or go to the nearest ER for medical or psychiatric attention.     Issues that need urgent attention but are not life threatening: call the clinic at [phone].    Non-urgent routine concerns, questions, and refill requests: please call the clinic for assistance, or you may call my voicemail at [phone], and I will reply within 2 business days.     Regarding appointments:  - If you need to cancel your appointment, we ask that you call [phone] at least 24 hours before your scheduled appointment.  - If for any reason you arrive 15 minutes later than your scheduled appointment time, you may not be seen and your visit may be rescheduled.  - Please remember that we will not automatically reschedule missed appointments.  - If you miss two (2) appointments without letting us know at least 24 hours in advance, you will likely be referred to a provider in your community.  - We will do our best to be on time. Sometimes an emergency will arise that might cause your clinician to be late. We will try to inform you of this when you check in for your appointment. If you wait more than 15 minutes past your appointment time without such notice, please speak with the front desk staff.    In the event of bad weather, the clinic staff will attempt to contact you, should your appointment need to be rescheduled. Additionally, you can call the Patient Weather Line [phone] for system-wide clinic status    For more information and reminders regarding clinic policies (these were provided when you were admitted to the clinic), please ask the front desk.

## 2020-12-03 MED ORDER — OLANZAPINE 20 MG TABLET
ORAL_TABLET | Freq: Every evening | ORAL | 1 refills | 30.00000 days
Start: 2020-12-03 — End: 2021-02-01

## 2020-12-03 NOTE — Unmapped (Signed)
Patient is requesting a increase refill of Zyprexa to 30 mg tablets. I have pended the medication for your approval and have verified the patient's pharmacy choice.

## 2020-12-04 MED ORDER — OLANZAPINE 20 MG TABLET
ORAL_TABLET | Freq: Every evening | ORAL | 1 refills | 30.00000 days | Status: CP
Start: 2020-12-04 — End: 2021-02-02

## 2020-12-05 ENCOUNTER — Ambulatory Visit: Admit: 2020-12-05 | Discharge: 2020-12-06 | Payer: MEDICAID

## 2020-12-05 DIAGNOSIS — R569 Unspecified convulsions: Principal | ICD-10-CM

## 2020-12-05 DIAGNOSIS — I10 Essential (primary) hypertension: Principal | ICD-10-CM

## 2020-12-05 LAB — COMPREHENSIVE METABOLIC PANEL
ALBUMIN: 4.1 g/dL (ref 3.4–5.0)
ALKALINE PHOSPHATASE: 86 U/L (ref 46–116)
ALT (SGPT): 17 U/L (ref 10–49)
ANION GAP: 11 mmol/L (ref 5–14)
AST (SGOT): 14 U/L (ref <=34)
BILIRUBIN TOTAL: 0.4 mg/dL (ref 0.3–1.2)
BLOOD UREA NITROGEN: 8 mg/dL — ABNORMAL LOW (ref 9–23)
BUN / CREAT RATIO: 12
CALCIUM: 9.4 mg/dL (ref 8.7–10.4)
CHLORIDE: 94 mmol/L — ABNORMAL LOW (ref 98–107)
CO2: 22.2 mmol/L (ref 20.0–31.0)
CREATININE: 0.69 mg/dL
EGFR CKD-EPI (2021) MALE: >90 mL/min/{1.73_m2} (ref >=60)
GLUCOSE RANDOM: 94 mg/dL (ref 70–179)
POTASSIUM: 4.4 mmol/L (ref 3.4–4.8)
PROTEIN TOTAL: 7.5 g/dL (ref 5.7–8.2)
SODIUM: 127 mmol/L — ABNORMAL LOW (ref 135–145)

## 2020-12-05 LAB — MAGNESIUM: MAGNESIUM: 1.7 mg/dL (ref 1.6–2.6)

## 2020-12-05 NOTE — Unmapped (Signed)
Received voicemail from patient's wife requesting prescription for Zyprexa 30 mg, which he has been taking for the last two weeks (patient reported at visit two days ago that he believed he was taking 20 mg nightly).     Called patient and discussed with him and his wife. She reported that they were using up remaining old prescriptions of Zyprexa which include 10 and 15 mg tablets, and when they ran out of 10 mg tablets they started having him take two 15 mg tablets at night because he appeared to be more irritable. They report at this dose, 30 mg, his mood is better, he is less irritable, and he seems to be more like the old [name]. They report he has been taking this dose for the last two weeks. He has not had any new side effects such as dizziness, unsteadiness, tremor, muscle stiffness. They also reported he had been taking this dose in the past prior to his several hospitalizations over the last year. Given that he has been taking this dose for the last two weeks with improvement in his mood/irritability and has not had any new adverse effects, agreed to continue with 30 mg nightly. Instructed patient and his wife to contact me if he starts to experience any adverse effects at the higher dose.

## 2020-12-07 LAB — LAMOTRIGINE LEVEL: LAMOTRIGINE LEVEL: 1.7 ug/mL — ABNORMAL LOW

## 2020-12-11 MED ORDER — FLUTICASONE PROPIONATE 50 MCG/ACTUATION NASAL SPRAY,SUSPENSION
Freq: Every day | NASAL | 12 refills | 61.00000 days
Start: 2020-12-11 — End: ?

## 2020-12-11 NOTE — Unmapped (Signed)
[facility] Refill Coordination Note    Specialty Medication(s) to be Shipped:   Inflammatory Disorders: Humira    Other medication(s) to be shipped: flonase nasal spray     Nicholas Rush, DOB: 12/04/1965  Phone: [phone] (home)       All above HIPAA information was verified with patient.     Was a Nurse, learning disability used for this call? No    Completed refill call assessment today to schedule patient's medication shipment from the [facility] [phone]).  All relevant notes have been reviewed.     Specialty medication(s) and dose(s) confirmed: Regimen is correct and unchanged.   Changes to medications: [name] reports no changes at this time.  Changes to insurance: No  New side effects reported not previously addressed with a pharmacist or physician: None reported  Questions for the pharmacist: No    Confirmed patient received a Conservation officer, historic buildings and a Surveyor, mining with first shipment. The patient will receive a drug information handout for each medication shipped and additional FDA Medication Guides as required.       DISEASE/MEDICATION-SPECIFIC INFORMATION        For patients on injectable medications: Patient currently has 0 doses left.  Next injection is scheduled for 12/24/2020.    SPECIALTY MEDICATION ADHERENCE     Medication Adherence    Patient reported X missed doses in the last month: 0  Specialty Medication: Humira CF 40 mg/0.4 ml  Patient is on additional specialty medications: No  Any gaps in refill history greater than 2 weeks in the last 3 months: no  Demonstrates understanding of importance of adherence: yes  Informant: patient  Reliability of informant: reliable  Confirmed plan for next specialty medication refill: delivery by pharmacy  Refills needed for supportive medications: not needed              Were doses missed due to medication being on hold? No    REFERRAL TO PHARMACIST     Referral to the pharmacist: Not needed      [facility]     Shipping address confirmed in Epic.     Delivery Scheduled: Yes, Expected medication delivery date: 12/19/2020.     Medication will be delivered via Same Day Courier to the prescription address in Epic WAM.    Salik Grewell D Tametria Aho   [facility] Specialty Technician

## 2020-12-16 MED ORDER — FLUTICASONE PROPIONATE 50 MCG/ACTUATION NASAL SPRAY,SUSPENSION
Freq: Every day | NASAL | 12 refills | 61.00000 days | Status: CP
Start: 2020-12-16 — End: ?
  Filled 2020-12-19: qty 16, 30d supply, fill #0

## 2020-12-16 NOTE — Unmapped (Signed)
Contact PCP for refills.

## 2020-12-17 MED ORDER — SODIUM CHLORIDE 1 GRAM TABLET
ORAL_TABLET | Freq: Three times a day (TID) | ORAL | 2 refills | 34 days
Start: 2020-12-17 — End: 2021-12-17

## 2020-12-17 NOTE — Unmapped (Signed)
Pt spouse called to leave a message with Dr. Cathie Hoops and Dr. Meryl Dare. Pt spouse message:    Pt has been acting strange for roughly 2 weeks. Pt does not seem to be manic, but is acting similar to response post seizure. Pt spouse suspects Pt had a seizure, but has not reported experiencing such due to irregular behaviors similar to post seizure behaviors (short term memory issues).

## 2020-12-18 MED ORDER — SODIUM CHLORIDE 1 GRAM TABLET
ORAL_TABLET | Freq: Three times a day (TID) | ORAL | 2 refills | 34 days | Status: CP
Start: 2020-12-18 — End: 2021-12-18

## 2020-12-19 MED FILL — HUMIRA SYRINGE CITRATE FREE 40 MG/0.4 ML: 28 days supply | Qty: 2 | Fill #3

## 2020-12-19 NOTE — Unmapped (Signed)
Patient's SO called to report that pt was seen by his psychiatrist, Dr Meryl Dare, today and he advised that they reach out to Dr Byrd Hesselbach about patient's condition.    Pt's SO says he's been acting strange-- started about 2 weeks ago-- he's gotten very forgetful, short term memory has decreased... he was doing wonderful over 3 weeks ago . I had to show him how to turn on the TV and he's had the same tv for over 6 years.  He asks a lot of questions after I tell him something..    Pt's Zyprexa had been increased a month ago, but Dr Meryl Dare is doubtful that that is what is causing pt's symptoms.  Dr Meryl Dare did decrease the Zyprexa dose at pt's visit.    Pt's SO says that no one has witnessed a seizure, but she and Dr Meryl Dare are wondering if pt is having seizures that could be contributing to his memory loss.

## 2020-12-19 NOTE — Unmapped (Signed)
Received message from patient's partner that he's been acting more strange and out of it over the past couple of weeks. Called her back to discuss.     She reports that for the last 1-2 weeks, he has seemed more out of it. They have had a lot going on as they are working on moving to [place] so she can be closer to her dad, so they have been back and forth a lot. She noted his short-term memory seems worse (often having to repeat things a few minutes later), and gave one example of him not knowing how to turn his TV off (which he has had for years). She said it reminds her of what he's like after he has a seizure, though she notes he has not had any witnessed seizures over the last couple of weeks.     ~4 weeks ago, they increased his Zyprexa from 20 to 30 mg nightly (see telephone note from 12/04/20 for further details). Given change in mental status, and potential that seizures may be contributing, recommended decreasing Zyprexa back to 20 mg given that this medication can lower seizure threshold in a dose-dependent manner. Recommended bringing him to ER for further evaluation if mental status acutely worsens. Pt follows up with PCP Dr. Cathie Hoops in 2 weeks.

## 2020-12-20 NOTE — Unmapped (Signed)
Error

## 2020-12-25 NOTE — Unmapped (Signed)
error 

## 2021-01-01 NOTE — Unmapped (Signed)
I tried reaching patient's SO, [name], to follow up from our phone conversation on 12/1.    I was going to give SO Dr Byrd Hesselbach' recommendations and find out if patient had had his EEG.    I left a HIPAA-compliant message for call back.    Marland Kitchen

## 2021-01-02 ENCOUNTER — Telehealth: Admit: 2021-01-02 | Discharge: 2021-01-03 | Payer: MEDICAID

## 2021-01-02 DIAGNOSIS — M545 Chronic midline low back pain without sciatica: Principal | ICD-10-CM

## 2021-01-02 DIAGNOSIS — I480 Paroxysmal atrial fibrillation: Principal | ICD-10-CM

## 2021-01-02 DIAGNOSIS — G8929 Other chronic pain: Principal | ICD-10-CM

## 2021-01-02 DIAGNOSIS — I10 Essential (primary) hypertension: Principal | ICD-10-CM

## 2021-01-02 MED ORDER — SODIUM CHLORIDE 1 GRAM TABLET
ORAL_TABLET | Freq: Three times a day (TID) | ORAL | 2 refills | 34.00000 days | Status: CP
Start: 2021-01-02 — End: 2022-01-02

## 2021-01-02 MED ORDER — GABAPENTIN 300 MG CAPSULE
ORAL_CAPSULE | Freq: Two times a day (BID) | ORAL | 5 refills | 30 days | Status: CP
Start: 2021-01-02 — End: 2021-02-01

## 2021-01-02 MED ORDER — OMEPRAZOLE 20 MG CAPSULE,DELAYED RELEASE
ORAL_CAPSULE | Freq: Every day | ORAL | 3 refills | 90 days | Status: CP
Start: 2021-01-02 — End: ?

## 2021-01-02 MED ORDER — DILTIAZEM ER 360 MG CAPSULE,24 HR,EXTENDED RELEASE
ORAL_CAPSULE | ORAL | 1 refills | 90 days | Status: CP
Start: 2021-01-02 — End: ?

## 2021-01-02 MED ORDER — LISINOPRIL 10 MG TABLET
ORAL_TABLET | Freq: Every day | ORAL | 3 refills | 90 days | Status: CP
Start: 2021-01-02 — End: 2022-01-02

## 2021-01-02 NOTE — Unmapped (Addendum)
Patient reports he is currently staying with his parents in [place].  He and his partner are currently in the process of moving to [facility] to be closer to her parents due to their ailing health. His partner overnight shipped his meds, and he notes he hasn't taken Lisinopril 10mg  in 2 days because he wasn't sure if he was supposed to.    Last 3 Home BP Readings Systolic Diastolic   01/02/2021 154 95   11/21/2020 153 92   10/24/2020 143 82   He measured his blood pressure and it was noted to be elevated again today  - restart lisinopril 10mg  once daily  - try to gather some hoem BP readings.   - f/u next month and work to get BP under control  - discussed it will be important for him to find a PCP who can see him in-person once he gets settled in [place], especially for ongoing lab monitoring.

## 2021-01-02 NOTE — Unmapped (Addendum)
Co-managed with [facility].  Currently on gabapentin 300mg  BID  Mobility most limited by his back pain.  Feels acute pain and mild stiffness upon standing, takes a while to warm up to get going.  Worse in the morning.   - had ESI with [facility] fall 2022, they are planning another in 3 months  - recently also had surgery with [facility] for carpal tunnel and cubital tunnel syndrome.  - has used up all PT visits for the year per Medicaid  - doing laps around the house daily with walker, and can walk a short distance with cane.  This week went to the grocery store for a 15 min trip, used the shopping cart as a walker, it went well.  Advised to keep trying trips out of the house  - using a shower chair, independent ADLs

## 2021-01-02 NOTE — Unmapped (Signed)
[facility] STEP Primary Care Clinic - [facility]  Family Medicine  Established Patient Video Visit Note  This medical encounter was conducted virtually using [email or web address]  TeleHealth protocols.      The patient reports they are currently: not at home - with his parents in [place]. I spent 17 minutes on the real-time audio and video with the patient on the date of service. I spent an additional 13 minutes on pre- and post-visit activities on the date of service.     The patient was physically located in [place] or a state in which I am permitted to provide care. The patient and/or parent/guardian understood that s/he may incur co-pays and cost sharing, and agreed to the telemedicine visit. The visit was reasonable and appropriate under the circumstances given the patient's presentation at the time.    The patient and/or parent/guardian has been advised of the potential risks and limitations of this mode of treatment (including, but not limited to, the absence of in-person examination) and has agreed to be treated using telemedicine. The patient's/patient's family's questions regarding telemedicine have been answered.     If the visit was completed in an ambulatory setting, the patient and/or parent/guardian has also been advised to contact their provider???s office for worsening conditions, and seek emergency medical treatment and/or call 911 if the patient deems either necessary.      Assessment/Plan:      Problem List Items Addressed This Visit        Cardiovascular and Mediastinum    Essential hypertension     Patient reports he is currently staying with his parents in [place].  He and his partner are currently in the process of moving to [facility] to be closer to her parents due to their ailing health. His partner overnight shipped his meds, and he notes he hasn't taken Lisinopril 10mg  in 2 days because he wasn't sure if he was supposed to.    Last 3 Home BP Readings Systolic Diastolic   01/02/2021 154 95   11/21/2020 153 92   10/24/2020 143 82   He measured his blood pressure and it was noted to be elevated again today  - restart lisinopril 10mg  once daily  - try to gather some hoem BP readings.   - f/u next month and work to get BP under control  - discussed it will be important for him to find a PCP who can see him in-person once he gets settled in [place], especially for ongoing lab monitoring.            Relevant Medications    diltiazem (TIAZAC) 360 MG 24 hr capsule    lisinopriL (PRINIVIL,ZESTRIL) 10 MG tablet    Paroxysmal atrial fibrillation (CMS-HCC)    Relevant Medications    aspirin (ECOTRIN) 81 MG tablet    diltiazem (TIAZAC) 360 MG 24 hr capsule    lisinopriL (PRINIVIL,ZESTRIL) 10 MG tablet       Other    Chronic midline low back pain     Co-managed with [facility].  Currently on gabapentin 300mg  BID  Mobility most limited by his back pain.  Feels acute pain and mild stiffness upon standing, takes a while to warm up to get going.  Worse in the morning.   - had ESI with [facility] fall 2022, they are planning another in 3 months  - recently also had surgery with [facility] for carpal tunnel and cubital tunnel syndrome.  - has used up all PT visits  for the year per Medicaid  - doing laps around the house daily with walker, and can walk a short distance with cane.  This week went to the grocery store for a 15 min trip, used the shopping cart as a walker, it went well.  Advised to keep trying trips out of the house  - using a shower chair, independent ADLs              Followup:  Return in 6 weeks (on 02/13/2021).      Preventive Care  Health Maintenance Due   Topic Date Due   ??? DTaP/Tdap/Td Vaccines (2 - Td or Tdap) 02/21/2019   ??? FIT-DNA Stool Test  06/17/2019   ??? COVID-19 Vaccine (4 - Booster for Moderna series) 07/15/2020   ??? Zoster Vaccines (2 of 2) 07/27/2020   Encouraged to get COVID bivalent booster    I personally spent 30 minutes face-to-face and non-face-to-face in the care of this patient, which includes all pre, intra, and post visit time on the date of service.    Future Appointments   Date Time Provider Department Center   02/10/2021 10:45 AM Augustin Coupe, MD [facility]   02/12/2021  1:00 PM [name], DO [facility]   03/12/2021  3:00 PM Truddie Hidden, MD [facility]            Subjective:   CC: No chief complaint on file.      Patient Care Team:  Nurum Joeseph Amor, MD as PCP - General (Internal Medicine)  Lorelle Gibbs, MD as Attending Provider (Psychiatry)  Augustin Coupe, MD as Resident (Psychiatry)  [name], DO as Resident (Nephrology)    HPI  Nicholas Rush is a 55 y.o. male requesting a video visit to discuss the following issues:      For details, pls see A/P    BP Readings from Last 3 Encounters:   11/20/20 140/91   10/30/20 123/78   10/02/20 146/70       Wt Readings from Last 3 Encounters:   11/20/20 82.6 kg (182 lb 3.2 oz)   10/30/20 88.4 kg (194 lb 12.8 oz)   10/02/20 87.8 kg (193 lb 8 oz)       BMI Readings from Last 3 Encounters:   11/20/20 25.79 kg/m??   10/30/20 27.58 kg/m??   10/02/20 27.39 kg/m??         ROS  As per HPI.       HISTORY  I have reviewed the patient's problem list, current medications, and allergies and have updated/reconciled them as needed.    Nicholas Rush  reports that he has been smoking cigarettes. He has a 45.00 pack-year smoking history. He has never used smokeless tobacco.       Objective:     General: Well appearing , in no acute distress  Skin: Color is normal. No rashes.  Psych: Appropriate affect, normal mood  Resp: Normal work of breathing, no retractions    [facility]  [address] [place], [place] [address] ??? Telephone [phone] ??? Fax [phone]

## 2021-01-14 NOTE — Unmapped (Signed)
I called patient's SO to ask if pt has had his EEG ordered by Dr Byrd Hesselbach.  I left message for call back, with no PHI.

## 2021-01-15 NOTE — Unmapped (Signed)
Patient called nurse line and said he has now moved to [place].  Does he need to change the dispensing pharmacy for Humira from [facility] since he has moved?  If so , what pharmacy would work for him?

## 2021-01-16 NOTE — Unmapped (Signed)
Specialty Medication(s): Humira    Nicholas Rush has been dis-enrolled from the [facility] specialty pharmacy services due to a pharmacy change. The patient is now filling at [facility].    Additional information provided to the patient: Patient aware, requested transfer     Tawanna Solo [facility] Specialty Pharmacist

## 2021-01-21 MED ORDER — MAGNESIUM OXIDE 400 MG (241.3 MG MAGNESIUM) TABLET
ORAL_TABLET | Freq: Two times a day (BID) | ORAL | 11 refills | 30 days | Status: CP
Start: 2021-01-21 — End: 2022-01-21

## 2021-02-07 MED ORDER — MAGNESIUM OXIDE 400 MG (241.3 MG MAGNESIUM) TABLET
ORAL_TABLET | Freq: Two times a day (BID) | ORAL | 11 refills | 30 days | Status: CP
Start: 2021-02-07 — End: 2022-02-07

## 2021-02-12 ENCOUNTER — Telehealth
Admit: 2021-02-12 | Discharge: 2021-02-13 | Payer: MEDICAID | Attending: Student in an Organized Health Care Education/Training Program | Primary: Student in an Organized Health Care Education/Training Program

## 2021-02-13 ENCOUNTER — Telehealth: Admit: 2021-02-13 | Discharge: 2021-02-14 | Payer: MEDICAID

## 2021-02-13 DIAGNOSIS — R195 Other fecal abnormalities: Principal | ICD-10-CM

## 2021-02-13 DIAGNOSIS — F1021 Alcohol dependence, in remission: Principal | ICD-10-CM

## 2021-02-13 DIAGNOSIS — I480 Paroxysmal atrial fibrillation: Principal | ICD-10-CM

## 2021-02-13 DIAGNOSIS — E871 Hypo-osmolality and hyponatremia: Principal | ICD-10-CM

## 2021-02-13 DIAGNOSIS — I10 Essential (primary) hypertension: Principal | ICD-10-CM

## 2021-02-13 DIAGNOSIS — Z1211 Encounter for screening for malignant neoplasm of colon: Principal | ICD-10-CM

## 2021-02-13 DIAGNOSIS — G8929 Other chronic pain: Principal | ICD-10-CM

## 2021-02-13 DIAGNOSIS — M545 Chronic midline low back pain without sciatica: Principal | ICD-10-CM

## 2021-02-13 MED ORDER — DILTIAZEM ER 360 MG CAPSULE,24 HR,EXTENDED RELEASE
ORAL_CAPSULE | ORAL | 1 refills | 90 days | Status: CP
Start: 2021-02-13 — End: ?

## 2021-02-13 MED ORDER — MAGNESIUM OXIDE 400 MG (241.3 MG MAGNESIUM) TABLET
ORAL_TABLET | Freq: Two times a day (BID) | ORAL | 11 refills | 30.00000 days | Status: CP
Start: 2021-02-13 — End: 2022-02-13

## 2021-02-13 MED ORDER — LISINOPRIL 10 MG TABLET
ORAL_TABLET | Freq: Every day | ORAL | 3 refills | 90 days | Status: CP
Start: 2021-02-13 — End: 2022-02-13

## 2021-02-19 MED ORDER — LAMOTRIGINE 100 MG TABLET
ORAL_TABLET | Freq: Every day | ORAL | 2 refills | 30 days | Status: CP
Start: 2021-02-19 — End: 2021-05-20

## 2021-03-01 MED ORDER — GABAPENTIN 300 MG CAPSULE
ORAL_CAPSULE | 5 refills | 0 days
Start: 2021-03-01 — End: ?

## 2021-03-03 MED ORDER — GABAPENTIN 300 MG CAPSULE
ORAL_CAPSULE | 5 refills | 0 days | Status: CP
Start: 2021-03-03 — End: ?

## 2021-03-17 ENCOUNTER — Telehealth
Admit: 2021-03-17 | Discharge: 2021-03-18 | Payer: MEDICAID | Attending: Student in an Organized Health Care Education/Training Program | Primary: Student in an Organized Health Care Education/Training Program

## 2021-03-17 DIAGNOSIS — F1021 Alcohol dependence, in remission: Principal | ICD-10-CM

## 2021-03-17 DIAGNOSIS — F319 Bipolar disorder, unspecified: Principal | ICD-10-CM

## 2021-03-17 MED ORDER — NALTREXONE 50 MG TABLET
ORAL_TABLET | Freq: Every day | ORAL | 0 refills | 90 days | Status: CP
Start: 2021-03-17 — End: ?

## 2021-04-10 ENCOUNTER — Telehealth: Admit: 2021-04-10 | Discharge: 2021-04-11 | Payer: MEDICAID

## 2021-04-10 DIAGNOSIS — E871 Hypo-osmolality and hyponatremia: Principal | ICD-10-CM

## 2021-04-10 DIAGNOSIS — I10 Essential (primary) hypertension: Principal | ICD-10-CM

## 2021-04-10 DIAGNOSIS — Z9189 Other specified personal risk factors, not elsewhere classified: Principal | ICD-10-CM

## 2021-04-10 DIAGNOSIS — M545 Chronic midline low back pain without sciatica: Principal | ICD-10-CM

## 2021-04-10 DIAGNOSIS — G8929 Other chronic pain: Principal | ICD-10-CM

## 2021-04-18 MED ORDER — OLANZAPINE 20 MG TABLET
ORAL_TABLET | 2 refills | 0 days
Start: 2021-04-18 — End: ?

## 2021-04-21 MED ORDER — OLANZAPINE 20 MG TABLET
ORAL_TABLET | 0 refills | 0 days | Status: CP
Start: 2021-04-21 — End: ?

## 2021-04-28 ENCOUNTER — Telehealth
Admit: 2021-04-28 | Discharge: 2021-04-29 | Payer: MEDICAID | Attending: Student in an Organized Health Care Education/Training Program | Primary: Student in an Organized Health Care Education/Training Program

## 2021-04-28 DIAGNOSIS — F319 Bipolar disorder, unspecified: Principal | ICD-10-CM

## 2021-04-28 DIAGNOSIS — F1021 Alcohol dependence, in remission: Principal | ICD-10-CM

## 2021-04-28 DIAGNOSIS — Z79899 Other long term (current) drug therapy: Principal | ICD-10-CM

## 2021-04-28 MED ORDER — LAMOTRIGINE 100 MG TABLET
ORAL_TABLET | Freq: Every day | ORAL | 1 refills | 30 days | Status: CP
Start: 2021-04-28 — End: 2021-06-27

## 2021-04-28 MED ORDER — OLANZAPINE 20 MG TABLET
ORAL_TABLET | Freq: Every evening | ORAL | 1 refills | 30 days | Status: CP
Start: 2021-04-28 — End: ?

## 2021-04-28 MED ORDER — NALTREXONE 50 MG TABLET
ORAL_TABLET | Freq: Every day | ORAL | 0 refills | 90 days | Status: CP
Start: 2021-04-28 — End: ?

## 2021-04-30 ENCOUNTER — Emergency Department (HOSPITAL_COMMUNITY): Payer: Medicaid Other

## 2021-04-30 ENCOUNTER — Other Ambulatory Visit: Payer: Self-pay

## 2021-04-30 ENCOUNTER — Encounter (HOSPITAL_COMMUNITY): Payer: Self-pay

## 2021-04-30 ENCOUNTER — Emergency Department (HOSPITAL_COMMUNITY)
Admission: EM | Admit: 2021-04-30 | Discharge: 2021-05-06 | Disposition: A | Discharge status: Home / Self Care | Payer: Medicaid Other | Attending: Emergency Medicine | Admitting: Emergency Medicine

## 2021-04-30 DIAGNOSIS — Y92009 Unspecified place in unspecified non-institutional (private) residence as the place of occurrence of the external cause: Secondary | ICD-10-CM | POA: Insufficient documentation

## 2021-04-30 DIAGNOSIS — S4992XA Unspecified injury of left shoulder and upper arm, initial encounter: Secondary | ICD-10-CM | POA: Diagnosis present

## 2021-04-30 DIAGNOSIS — S43005A Unspecified dislocation of left shoulder joint, initial encounter: Secondary | ICD-10-CM

## 2021-04-30 DIAGNOSIS — W1830XA Fall on same level, unspecified, initial encounter: Secondary | ICD-10-CM | POA: Diagnosis not present

## 2021-04-30 DIAGNOSIS — S43015A Anterior dislocation of left humerus, initial encounter: Secondary | ICD-10-CM | POA: Diagnosis not present

## 2021-04-30 DIAGNOSIS — Z7982 Long term (current) use of aspirin: Secondary | ICD-10-CM | POA: Insufficient documentation

## 2021-04-30 HISTORY — DX: Unspecified convulsions: R56.9

## 2021-04-30 LAB — CBC
HCT: 37.5 % — ABNORMAL LOW (ref 39.0–52.0)
Hemoglobin: 13.9 g/dL (ref 13.0–17.0)
MCH: 38 pg — ABNORMAL HIGH (ref 26.0–34.0)
MCHC: 37.1 g/dL — ABNORMAL HIGH (ref 30.0–36.0)
MCV: 102.5 fL — ABNORMAL HIGH (ref 80.0–100.0)
Platelets: 121 10*3/uL — ABNORMAL LOW (ref 150–400)
RBC: 3.66 MIL/uL — ABNORMAL LOW (ref 4.22–5.81)
RDW: 14.6 % (ref 11.5–15.5)
WBC: 7 10*3/uL (ref 4.0–10.5)
nRBC: 0 % (ref 0.0–0.2)

## 2021-04-30 LAB — BASIC METABOLIC PANEL
Anion gap: 12 (ref 5–15)
BUN: 7 mg/dL (ref 6–20)
CO2: 25 mmol/L (ref 22–32)
Calcium: 8.4 mg/dL — ABNORMAL LOW (ref 8.9–10.3)
Chloride: 94 mmol/L — ABNORMAL LOW (ref 98–111)
Creatinine, Ser: 0.36 mg/dL — ABNORMAL LOW (ref 0.61–1.24)
GFR, Estimated: >60 mL/min (ref >60)
Glucose, Bld: 88 mg/dL (ref 70–99)
Potassium: 4 mmol/L (ref 3.5–5.1)
Sodium: 131 mmol/L — ABNORMAL LOW (ref 135–145)

## 2021-04-30 MED ORDER — PROPOFOL 10 MG/ML IV BOLUS
INTRAVENOUS | Status: AC | PRN
  Administered 2021-04-30: 84 mg via INTRAVENOUS

## 2021-04-30 MED ORDER — PROPOFOL 10 MG/ML IV BOLUS
1.0000 mg/kg | Freq: Once | INTRAVENOUS | Status: AC
Start: 2021-04-30 — End: 2021-04-30
  Administered 2021-04-30: 83.9 mg via INTRAVENOUS
  Filled 2021-04-30: qty 20

## 2021-04-30 MED ORDER — ACETAMINOPHEN 325 MG PO TABS
650.0000 mg | ORAL_TABLET | Freq: Four times a day (QID) | ORAL | Status: DC | PRN
  Administered 2021-05-02 – 2021-05-06 (×2): 650 mg via ORAL
  Filled 2021-04-30 (×2): qty 2

## 2021-04-30 MED ORDER — FENTANYL CITRATE PF 50 MCG/ML IJ SOSY
50.0000 ug | PREFILLED_SYRINGE | Freq: Once | INTRAMUSCULAR | Status: AC
  Administered 2021-04-30: 50 ug via INTRAVENOUS
  Filled 2021-04-30: qty 1

## 2021-04-30 MED ORDER — IBUPROFEN 200 MG PO TABS
600.0000 mg | ORAL_TABLET | Freq: Four times a day (QID) | ORAL | Status: DC | PRN
Start: 2021-04-30 — End: 2021-05-07

## 2021-04-30 NOTE — Discharge Instructions (Addendum)
You were seen in the emergency department for left shoulder injury.  You had a dislocation that was reduced.  We will be important for you to stay in the sling and follow-up with orthopedics.  Social work is setting you up with appointments for physical therapy and primary care doctor.  Wheelchair has been ordered. ? ?It was a pleasure caring for you today in the emergency department. ? ?Please return to the emergency department for any worsening or worrisome symptoms. ? ?

## 2021-04-30 NOTE — ED Provider Triage Note (Signed)
Emergency Medicine Provider Triage Evaluation Note ? ?Billy Anderson , a 56 y.o. male  was evaluated in triage.  Pt complains of agile onset, constant, achy, left shoulder pain status post mechanical fall that occurred yesterday.  Patient states he tripped and fell and landed on his left shoulder.  He denies any head injury or loss of consciousness. Pt covered in feces with EMS.  ? ?Review of Systems  ?Positive: + l shoulder pain ?Negative: - head injury, LOC ? ?Physical Exam  ?BP 130/70 (BP Location: Left Arm)   Pulse (!) 106   Temp 98.3 ?F (36.8 ?C) (Oral)   Resp 18   Ht 6' (1.829 m)   Wt 83.9 kg   SpO2 97%   BMI 25.09 kg/m?  ?Gen:   Awake, no distress   ?Resp:  Normal effort  ?MSK:   Moves extremities without difficulty  ?Other:   ? ?Medical Decision Making  ?Medically screening exam initiated at 7:36 PM.  Appropriate orders placed.  Billy Anderson was informed that the remainder of the evaluation will be completed by another provider, this initial triage assessment does not replace that evaluation, and the importance of remaining in the ED until their evaluation is complete. ? ? ?  ?Tanda Rockers, PA-C ?04/30/21 1937 ? ?

## 2021-04-30 NOTE — ED Provider Notes (Signed)
?[facility]-EMERGENCY DEPT ?Provider Note ? ? ?CSN: [id number] ?Arrival date & time: 04/30/21  1857 ? ?  ? ?History ? ?Chief Complaint  ?Patient presents with  ? Fall  ? Shoulder Injury  ? ? ?Virl Coble III is a 56 y.o. male Presenting to the ED with a fall at home and left shoulder pain.  The patient reports that he fell last night and is having pain in his left shoulder.  He denies this location of his shoulder before.  Denies head trauma.  He has chronic back pain.  He was reportedly found at home covered in feces.  He says he spent the night on the ground after falling on his shoulder, he feels extremely weak. ? ?HPI ? ?  ? ?Home Medications ?Prior to Admission medications   ?Medication Sig Start Date End Date Taking? Authorizing Provider  ?acetaminophen (TYLENOL) 500 MG tablet Take 1,000 mg by mouth every 6 (six) hours as needed for moderate pain.   Yes [provider]  ?Adalimumab (HUMIRA PEN [place]) Inject into the skin every 14 (fourteen) days. Every other Sunday   Yes [provider]  ?aspirin EC 81 MG tablet Take 81 mg by mouth daily. Swallow whole.   Yes [provider]  ?calcium-vitamin D (OSCAL WITH D) 500-5 MG-MCG tablet Take 1 tablet by mouth 3 (three) times daily.   Yes [provider]  ?cetirizine (ZYRTEC) 10 MG tablet Take 10 mg by mouth daily.   Yes [provider]  ?diltiazem (CARDIZEM CD) 360 MG 24 hr capsule Take 360 mg by mouth daily.   Yes [provider]  ?fluticasone (FLONASE) 50 MCG/ACT nasal spray Place 2 sprays into both nostrils in the morning.   Yes [provider]  ?gabapentin (NEURONTIN) 300 MG capsule Take 300 mg by mouth 2 (two) times daily.   Yes [provider]  ?lamoTRIgine (LAMICTAL) 100 MG tablet Take 200 mg by mouth at bedtime.   Yes [provider]  ?lisinopril (ZESTRIL) 10 MG tablet Take 10 mg by mouth daily.   Yes [provider]  ?magnesium oxide (MAG-OX) 400 MG  tablet Take 800 mg by mouth 2 (two) times daily.   Yes [provider]  ?melatonin 3 MG TABS tablet Take 3 mg by mouth at bedtime.   Yes [provider]  ?Multiple Vitamin (MULTIVITAMIN) tablet Take 1 tablet by mouth daily.   Yes [provider]  ?naltrexone (DEPADE) 50 MG tablet Take 50 mg by mouth daily.   Yes [provider]  ?OLANZapine (ZYPREXA) 20 MG tablet Take 20 mg by mouth at bedtime.   Yes [provider]  ?omeprazole (PRILOSEC) 20 MG capsule Take 20 mg by mouth daily.   Yes [provider]  ?sodium chloride 1 g tablet Take 1 g by mouth in the morning, at noon, in the evening, and at bedtime.   Yes [provider]  ?thiamine 100 MG tablet Take 100 mg by mouth daily.   Yes [provider]  ?   ? ?Allergies    ?Hydroxyzine, Oxycodone, and Tramadol   ? ?Review of Systems   ?Review of Systems ? ?Physical Exam ?Updated Vital Signs ?BP (!) 158/92   Pulse (!) 104   Temp 98.8 ?F (37.1 ?C) (Oral)   Resp 18   Ht 6' (1.829 m)   Wt 83.9 kg   SpO2 94%   BMI 25.09 kg/m?  ?Physical Exam ?Constitutional:   ?   General: He  is not in acute distress. ?HENT:  ?   Head: Normocephalic and atraumatic.  ?Eyes:  ?   Conjunctiva/sclera: Conjunctivae normal.  ?   Pupils: Pupils are equal, round, and reactive to light.  ?Cardiovascular:  ?   Rate and Rhythm: Normal rate and regular rhythm.  ?   Pulses: Normal pulses.  ?Pulmonary:  ?   Effort: Pulmonary effort is normal. No respiratory distress.  ?Abdominal:  ?   General: There is no distension.  ?   Tenderness: There is no abdominal tenderness.  ?Musculoskeletal:  ?   Comments: Squaring of the left shoulder and tenderness with any movement  ?Skin: ?   General: Skin is warm and dry.  ?Neurological:  ?   General: No focal deficit present.  ?   Mental Status: He is alert. Mental status is at baseline.  ? ? ?ED Results / Procedures / Treatments   ?Labs ?(all labs ordered are listed, but only abnormal results  are displayed) ?Labs Reviewed  ?BASIC METABOLIC PANEL - Abnormal; Notable for the following components:  ?    Result Value  ? Sodium 131 (*)   ? Chloride 94 (*)   ? Creatinine, Ser 0.36 (*)   ? Calcium 8.4 (*)   ? All other components within normal limits  ?CBC - Abnormal; Notable for the following components:  ? RBC 3.66 (*)   ? HCT 37.5 (*)   ? MCV 102.5 (*)   ? MCH 38.0 (*)   ? MCHC 37.1 (*)   ? Platelets 121 (*)   ? All other components within normal limits  ? ? ?EKG ?None ? ?Radiology ?DG Shoulder Left ? ?Result Date: 04/30/2021 ?CLINICAL DATA:  shoulder pain EXAM: LEFT SHOULDER - 2+ VIEW COMPARISON:  None. FINDINGS: Anterior left shoulder dislocation. No acute displaced fracture identified. Acromioclavicular degenerative changes. No aggressive appearing focal bone abnormality. Soft tissues are unremarkable. IMPRESSION: Anterior left shoulder dislocation. Electronically Signed   By: Tish Frederickson M.D.   On: 04/30/2021 20:38  ? ?DG Shoulder Left Portable ? ?Result Date: 04/30/2021 ?CLINICAL DATA:  Status post reduction EXAM: LEFT SHOULDER COMPARISON:  Film from earlier in the same day. FINDINGS: Humeral head is well seated with somewhat inferiorly subluxed similar to that seen on the prior exam. No other focal abnormality is noted. IMPRESSION: Status post reduction. Electronically Signed   By: Alcide Clever M.D.   On: 04/30/2021 22:55   ? ?Procedures ?.Sedation ? ?Date/Time: 04/30/2021 11:29 PM ?Performed by: Terald Sleeper, MD ?Authorized by: Terald Sleeper, MD  ? ?Consent:  ?  Consent obtained:  Verbal ?  Consent given by:  Patient ?  Risks discussed:  Allergic reaction, dysrhythmia, vomiting, respiratory compromise necessitating ventilatory assistance and intubation, prolonged sedation necessitating reversal, inadequate sedation and nausea ?Universal protocol:  ?  Procedure explained and questions answered to patient or proxy's satisfaction: yes   ?  Immediately prior to procedure, a time out was called:  yes   ?  Patient identity confirmed:  Arm band and verbally with patient ?Indications:  ?  Procedure performed:  Fracture reduction ?  Procedure necessitating sedation performed by:  Physician performing sedation ?Pre-sedation assessment:  ?  Time since last food or drink:  5 hours ?  ASA classification: class 2 - patient with mild systemic disease   ?  Mallampati score:  II - soft palate, uvula, fauces visible ?  Pre-sedation assessments completed and reviewed: airway patency, cardiovascular function, hydration status, mental status, nausea/vomiting, pain  level, respiratory function and temperature   ?Immediate pre-procedure details:  ?  Reassessment: Patient reassessed immediately prior to procedure   ?  Reviewed: vital signs   ?  Verified: bag valve mask available, emergency equipment available, intubation equipment available, IV patency confirmed, oxygen available, reversal medications available and suction available   ?Procedure details (see MAR for exact dosages):  ?  Preoxygenation:  Nasal cannula ?  Sedation:  Propofol ?  Intended level of sedation: deep ?  Analgesia:  Fentanyl ?  Intra-procedure monitoring:  Blood pressure monitoring, cardiac monitor, continuous capnometry, continuous pulse oximetry, frequent LOC assessments and frequent vital sign checks ?  Intra-procedure events: none   ?  Total Provider sedation time (minutes):  45 ?Post-procedure details:  ?  Attendance: Constant attendance by certified staff until patient recovered   ?  Recovery: Patient returned to pre-procedure baseline   ?  Post-sedation assessments completed and reviewed: airway patency, cardiovascular function, hydration status, mental status, nausea/vomiting, pain level, respiratory function and temperature   ?  Patient is stable for discharge or admission: yes   ?  Procedure completion:  Tolerated well, no immediate complications ?Reduction of dislocation ? ?Date/Time: 04/30/2021 11:30 PM ?Performed by: Terald Sleeperrifan, Cicely Ortner J,  MD ?Authorized by: Terald Sleeperrifan, Mouhamed Glassco J, MD  ?Consent: Verbal consent obtained. ?Risks and benefits: risks, benefits and alternatives were discussed ?Consent given by: patient ?Patient understanding: patient states unders

## 2021-04-30 NOTE — Progress Notes (Signed)
Respiratory therapy present for the duration of sedation procedure.  Pt remained stable and comfortable on EtCO2 Nasal Cannula monitoring for duration of procedure. ?

## 2021-04-30 NOTE — ED Triage Notes (Addendum)
Pt BIB [facility] from home. Pt had a fall last night and this morning. Pt is complaining of left shoulder and upper arm pain. Pt states that it hurts when he lifts it in the air. Pt states that he usually walks with a cane but is having severe weakness in his legs.  ? ?Pt's girlfriend: [name] [phone] ?

## 2021-05-01 ENCOUNTER — Encounter (HOSPITAL_COMMUNITY): Payer: Self-pay | Admitting: Emergency Medicine

## 2021-05-01 ENCOUNTER — Emergency Department (HOSPITAL_COMMUNITY): Payer: Medicaid Other

## 2021-05-01 NOTE — Progress Notes (Addendum)
CSW inquired about pt's finances, pt stated he receives 2300 from disability. CSW explained to pt he may be able to d/c to a facility if he agrees to sign his check over and stay at the facility for 30 days. CSW sent pt's information out to [facility] for review.  ? ?1:30pm ?[facility] is requesting additional information for pt's PASRR.  ? ?2:40pm ?Pt was offer bed with [facility] , pt accepted bed offer. PASRR pending.  ? ?Valentina Shaggy.Tyler Robidoux, MSW, LCSWA ?[facility]  Transitions of Care ?Clinical Social Worker I ?Direct Dial: [phone]  Fax: [phone] ?[email or web address]  ? ? ? ? ?

## 2021-05-01 NOTE — [place] FL2 (Addendum)
?[place] MEDICAID FL2 LEVEL OF CARE SCREENING TOOL  ?  ? ?IDENTIFICATION  ?Patient Name: ?Billy Anderson Birthdate: [birth year] Sex: male Admission Date (Current Location): ?04/30/2021  ?[place] Number: ? [place] ?[id number] M Facility and Address:  ?[facility],  [address], [place] [address] ?     Provider Number: ?[id number]  ?Attending Physician Name and Address:  ?Default, Provider, MD ? Relative Name and Phone Number:  ?Fuller Song)   [phone] ?   ?Current Level of Care: ?Hospital Recommended Level of Care: ?Skilled Nursing Facility Prior Approval Number: ?  ? ?Date Approved/Denied: ?  PASRR Number: ?pending ? ?Discharge Plan: ?SNF ?  ? ?Current Diagnoses: ?There are no problems to display for this patient. ? ? ?Orientation RESPIRATION BLADDER Height & Weight   ?  ?Self, Time, Situation, Place ? Normal Incontinent  Weight: 185 lb (83.9 kg) ?Height:  6' (182.9 cm)  ?BEHAVIORAL SYMPTOMS/MOOD NEUROLOGICAL BOWEL NUTRITION STATUS  ?    Continent Diet (regular)  ?AMBULATORY STATUS COMMUNICATION OF NEEDS Skin   ?Extensive Assist Verbally Normal ?  ?  ?  ?    ?     ?     ? ? ?Personal Care Assistance Level of Assistance  ?Bathing, Feeding, Dressing Bathing Assistance: Limited assistance ?Feeding assistance: Independent ?Dressing Assistance: Limited assistance ?   ? ?Functional Limitations Info  ?Sight, Hearing, Speech Sight Info: Adequate ?Hearing Info: Adequate ?Speech Info: Adequate  ? ? ?SPECIAL CARE FACTORS FREQUENCY  ?PT (By licensed PT), OT (By licensed OT)   ?  ?PT Frequency: 5 x a week ?OT Frequency: 5 x a week ?  ?  ?  ?   ? ? ?Contractures Contractures Info: Not present  ? ? ?Additional Factors Info  ?Code Status, Allergies Code Status Info: full ?Allergies Info: Hydroxyzine, Oxycodone, Tramadol ?  ?  ?  ?   ? ?Current Medications (05/01/2021):  This is the current hospital active medication list ?Current Facility-Administered Medications  ?Medication Dose Route  Frequency Provider Last Rate Last Admin  ? acetaminophen (TYLENOL) tablet 650 mg  650 mg Oral Q6H PRN Terald Sleeper, MD      ? ibuprofen (ADVIL) tablet 600 mg  600 mg Oral Q6H PRN Terald Sleeper, MD      ? ?Current Outpatient Medications  ?Medication Sig Dispense Refill  ? acetaminophen (TYLENOL) 500 MG tablet Take 1,000 mg by mouth every 6 (six) hours as needed for moderate pain.    ? Adalimumab (HUMIRA PEN [place]) Inject into the skin every 14 (fourteen) days. Every other Sunday    ? aspirin EC 81 MG tablet Take 81 mg by mouth daily. Swallow whole.    ? calcium-vitamin D (OSCAL WITH D) 500-5 MG-MCG tablet Take 1 tablet by mouth 3 (three) times daily.    ? cetirizine (ZYRTEC) 10 MG tablet Take 10 mg by mouth daily.    ? diltiazem (CARDIZEM CD) 360 MG 24 hr capsule Take 360 mg by mouth daily.    ? fluticasone (FLONASE) 50 MCG/ACT nasal spray Place 2 sprays into both nostrils in the morning.    ? gabapentin (NEURONTIN) 300 MG capsule Take 300 mg by mouth 2 (two) times daily.    ? lamoTRIgine (LAMICTAL) 100 MG tablet Take 200 mg by mouth at bedtime.    ? lisinopril (ZESTRIL) 10 MG tablet Take 10 mg by mouth daily.    ? magnesium oxide (MAG-OX) 400 MG tablet Take 800 mg by mouth 2 (two) times  daily.    ? melatonin 3 MG TABS tablet Take 3 mg by mouth at bedtime.    ? Multiple Vitamin (MULTIVITAMIN) tablet Take 1 tablet by mouth daily.    ? naltrexone (DEPADE) 50 MG tablet Take 50 mg by mouth daily.    ? OLANZapine (ZYPREXA) 20 MG tablet Take 20 mg by mouth at bedtime.    ? omeprazole (PRILOSEC) 20 MG capsule Take 20 mg by mouth daily.    ? sodium chloride 1 g tablet Take 1 g by mouth in the morning, at noon, in the evening, and at bedtime.    ? thiamine 100 MG tablet Take 100 mg by mouth daily.    ? ? ? ?Discharge Medications: ?Please see discharge summary for a list of discharge medications. ? ?Relevant Imaging Results: ? ?Relevant Lab Results: ? ? ?Additional Information ?SSN: [id number] ? ?Novelle Addair M  Sofya Moustafa, LCSW ? ? ? ? ?

## 2021-05-01 NOTE — Evaluation (Signed)
Physical Therapy Evaluation ?Patient Details ?Name: Billy Anderson ?MRN: [id number] ?DOB: 16-Apr-1965 ?Today's Date: 05/01/2021 ? ?History of Present Illness ? 56 y.o. male presented to ED with left anterior shoulder dislocation from a fall. s/p L shoulder reduction in ED. No PMH available in chart other than chronic back pain.  ?Clinical Impression ? Pt admitted with above diagnosis. Pt was lethargic and difficult to arouse during PT evaluation. He briefly opened his eyes, and had very brief responses to questions. Assisted pt to roll to his R side, then attempted sidelying to sit with total assist, pt unable to come to full upright position 2* back pain and lethargy. At present, he needs 24* assistance. He reports his girlfriend, who lives with him, works during the day.  Pt currently with functional limitations due to the deficits listed below (see PT Problem List). Pt will benefit from skilled PT to increase their independence and safety with mobility to allow discharge to the venue listed below.   ?   ?   ? ?Recommendations for follow up therapy are one component of a multi-disciplinary discharge planning process, led by the attending physician.  Recommendations may be updated based on patient status, additional functional criteria and insurance authorization. ? ?Follow Up Recommendations Skilled nursing-short term rehab (<3 hours/day) ? ?  ?Assistance Recommended at Discharge Frequent or constant Supervision/Assistance  ?Patient can return home with the following ? Two people to help with walking and/or transfers;A lot of help with bathing/dressing/bathroom;Direct supervision/assist for medications management;Assistance with cooking/housework;Assist for transportation;Help with stairs or ramp for entrance ? ?  ?Equipment Recommendations Wheelchair (measurements PT);Wheelchair cushion (measurements PT)  ?Recommendations for Other Services ?    ?  ?Functional Status Assessment Patient has had a recent decline in  their functional status and demonstrates the ability to make significant improvements in function in a reasonable and predictable amount of time.  ? ?  ?Precautions / Restrictions Precautions ?Precautions: Fall ?Precaution Comments: L shoulder in sling s/p reduction of anterior dislocation 04/30/21, secure chatted MD requesting parameters for use of LUE; fell just prior to admission ?Restrictions ?Weight Bearing Restrictions: No  ? ?  ? ?Mobility ? Bed Mobility ?Overal bed mobility: Needs Assistance ?Bed Mobility: Rolling, Sidelying to Sit ?Rolling: Mod assist ?Sidelying to sit: Total assist ?  ?  ?  ?General bed mobility comments: verbal/manual cues for log roll to R, attempted sidelying to sit with total assist, pt unable to come to full upright position 2* back pain ?  ? ?Transfers ?  ?  ?  ?  ?  ?  ?  ?  ?  ?General transfer comment: NT- unable due to pain and lethargy ?  ? ?Ambulation/Gait ?  ?  ?  ?  ?  ?  ?  ?General Gait Details: NT ? ?Stairs ?  ?  ?  ?  ?  ? ?Wheelchair Mobility ?  ? ?Modified Rankin (Stroke Patients Only) ?  ? ?  ? ?Balance   ?  ?  ?  ?  ?  ?  ?  ?  ?  ?  ?  ?  ?  ?  ?  ?  ?  ?  ?   ? ? ? ?Pertinent Vitals/Pain Pain Assessment ?Pain Assessment: 0-10 ?Pain Score: 8  ?Pain Location: low back with bed mobility ?Pain Descriptors / Indicators: Grimacing, Guarding ?Pain Intervention(s): Limited activity within patient's tolerance, Monitored during session, Patient requesting pain meds-RN notified  ? ? ?Home Living Family/patient expects  to be discharged to:: Private residence ?Living Arrangements: Spouse/significant other ?  ?  ?Home Access: Stairs to enter ?Entrance Stairs-Rails: Left;Right ?Entrance Stairs-Number of Steps: 5 ?  ?  ?Home Equipment: Conservation officer, nature (2 wheels);Rollator (4 wheels);Cane - single point ?Additional Comments: lives with girlfriend who works during the day. Pt lethargic, difficult to arouse, responded minimally to questions about home setup and prior level of function.   ?  ?Prior Function Prior Level of Function : Independent/Modified Independent ?  ?  ?  ?  ?  ?  ?Mobility Comments: walked using either cane or RW ?  ?  ? ? ?Hand Dominance  ?   ? ?  ?Extremity/Trunk Assessment  ? Upper Extremity Assessment ?Upper Extremity Assessment: LUE deficits/detail ?LUE Deficits / Details: per imaging, L shoulder is subluxed following reduction of anterior dislocation, pt in sling in bed, awaiting parameters for LUE so did not attempt mobility with LUE ?LUE: Subluxation noted ?  ? ?Lower Extremity Assessment ?Lower Extremity Assessment: Generalized weakness;Difficult to assess due to impaired cognition;RLE deficits/detail;LLE deficits/detail ?RLE Deficits / Details: difficult to assess 2* pt's lethargy, ankle DF/PF B  4/5, SLR 3/5 ?RLE Sensation: WNL ?LLE Deficits / Details: ankle DF/PF 4/5, SLR +2/5 ?LLE Sensation: WNL ?  ? ?   ?Communication  ? Communication: No difficulties  ?Cognition Arousal/Alertness: Lethargic ?Behavior During Therapy: Flat affect ?Overall Cognitive Status: No family/caregiver present to determine baseline cognitive functioning ?  ?  ?  ?  ?  ?  ?  ?  ?  ?  ?  ?  ?  ?  ?  ?  ?General Comments: pt with eyes closed majority of PT session, difficult to arouse, did respond minimally to verbal questions, ablet to follow 1 step commands ?  ?  ? ?  ?General Comments   ? ?  ?Exercises    ? ?Assessment/Plan  ?  ?PT Assessment Patient needs continued PT services  ?PT Problem List Decreased mobility;Decreased strength;Decreased activity tolerance;Pain;Decreased knowledge of use of DME ? ?   ?  ?PT Treatment Interventions Therapeutic activities;Therapeutic exercise;Patient/family education;Gait training;Functional mobility training   ? ?PT Goals (Current goals can be found in the Care Plan section)  ?Acute Rehab PT Goals ?Patient Stated Goal: to get more mobile ?PT Goal Formulation: With patient ?Time For Goal Achievement: 05/15/21 ?Potential to Achieve Goals: Fair ? ?   ?Frequency Min 2X/week ?  ? ? ?Co-evaluation   ?  ?  ?  ?  ? ? ?  ?AM-PAC PT "6 Clicks" Mobility  ?Outcome Measure Help needed turning from your back to your side while in a flat bed without using bedrails?: A Lot ?Help needed moving from lying on your back to sitting on the side of a flat bed without using bedrails?: Total ?Help needed moving to and from a bed to a chair (including a wheelchair)?: Total ?Help needed standing up from a chair using your arms (e.g., wheelchair or bedside chair)?: Total ?Help needed to walk in hospital room?: Total ?Help needed climbing 3-5 steps with a railing? : Total ?6 Click Score: 7 ? ?  ?End of Session   ?Activity Tolerance: Patient limited by lethargy;Patient limited by pain ?Patient left: in bed;with call bell/phone within reach ?Nurse Communication: Mobility status;Patient requests pain meds ?PT Visit Diagnosis: Difficulty in walking, not elsewhere classified (R26.2);Muscle weakness (generalized) (M62.81);History of falling (Z91.81);Pain ?Pain - part of body:  (back) ?  ? ?Time: [id number] ?PT Time Calculation (min) (ACUTE ONLY): 14 min ? ? ?  Charges:   PT Evaluation ?$PT Eval Moderate Complexity: 1 Mod ?  ?  ?   ? ?Blondell Reveal Kistler PT 05/01/2021  ?Acute Rehabilitation Services ?Pager [phone] ?Office [phone] ? ? ?

## 2021-05-01 NOTE — ED Provider Notes (Signed)
Patient is a border here for Social work evaluation.  He had a shoulder dislocation that was reduced.  Baseline weakness.  Social work is evaluated patient and I am ordering him a wheelchair and outpatient physical therapy.  Apparently uninsured and does not have resources for SNF.  ?  ?Terrilee Files, MD ?05/01/21 1731 ? ?

## 2021-05-01 NOTE — Progress Notes (Addendum)
.  Transition of Care [facility]) - Emergency Department Mini Assessment ? ? ?Patient Details  ?Name: Billy Anderson ?MRN: [id number] ?Date of Birth: [birth year] ? ?Transition of Care (TOC) CM/SW Contact:    ?Valentina Shaggy Brandley Aldrete, LCSW ?Phone Number: ?05/01/2021, 9:39 AM ? ? ?Clinical Narrative: ? ?CSW received a consult in regard to [facility] needs. Pt is from home with multiple falls, pt stated he fell yesterday, hurt his shoulder, and was unable to get up. Pt stated he use a walker at baseline. Pt reported he just moved from [place] to [place] in January with his Girlfriend. Pt stated he has trouble with ambulating.  CSW informed pt about his barriers to SNF placement and [facility] services. CSW informed pt, if he would like more assistance in the home he would have to pay out of pocket for care. Pt reported not having the funds for Private Duty Care. Pt reported having family but they work all day. CSW informed pt he can receive a wheelchair and will be referred to OP PT. Pt agreed he would be able to make it to OP physical therapy. CSW informed pt about LTC and that he can work with his primary care doctor to set it up. Pt stated he does not have a PCP at this time as he just moved to the area. CSW will arrange PCP appointment to help assist with LTC placement.  ? ? ?Adden  ?PT eval pending. TOC to follow.  ? ? ?ED Mini Assessment: ?What brought you to the Emergency Department? : fall ? ?Barriers to Discharge: ED DME delivery ? ?  ? ?  ? ?  ? ? ? ?Patient Contact and Communications ?  ?  ?  ? ,     ?  ?  ? ?  ?  ?  ? ?Admission diagnosis:  fall,left shoulder pain ?There are no problems to display for this patient. ? ?PCP:  Pcp, No ?Pharmacy:   ?[id number] [place], [place] - [address] ?[address] ?[place] [address] ?Phone: [phone] Fax: [phone] ?  ?

## 2021-05-01 NOTE — Evaluation (Signed)
Occupational Therapy Evaluation ?Patient Details ?Name: Billy Anderson ?MRN: [id number] ?DOB: 05/06/1965 ?Today's Date: 05/01/2021 ? ? ?History of Present Illness 56 y.o. male presented to ED with left anterior shoulder dislocation from a fall. s/p L shoulder reduction in ED. No PMH available in chart other than chronic back pain.  ? ?Clinical Impression ?  ?[name] is a 56 year old man who presents after fall at home and reduction of dislocated left shoulder. At baseline patient is grossly functional with walker at home by himself during the day - though he reports some difficulty with dressing. Patient's girlfriend works during the day. Patient presents with inability to use left upper extremity functional due to being immobilized. Patient unable to raise shoulder greater than approx 30 degrees but could use elbow, wrist and hand functionally. Patient reports Billy Anderson impaired for the last six months - he cannot raise the shoulder or flex his elbow without using LUE to assist. Patient found covered in food as he was unable to feed himself effectively, reach food on his tray or open containers. Patient able to wash his face and hands with a cloth but needing significant assistance for UB dressing, LB ADLs and toileting. He needs max assist to transfer to edge of bed and mod assist to power up into standing. Therapist had removed his sling and he was able to keep right arm on walker and reduce weight bearing to take steps to head of the bed. Patient will benefit from skilled OT services while in hospital to improve deficits and learn compensatory strategies as needed in order to return to PLOF.  ?Patient is needing too much assistance with transfers and ADLs to return home without 24/7 physical assistance. Patient doesn't have enough functional UE use to maneuver wheelchair at this time.  ?Will need to  clarify his weight bearing status and how long he needs to wear sling on left s/p reduction as his left arm is  the more functional one. ?Patient needs short term rehab or skilled nursing care at discharge.  ? ?   ? ?Recommendations for follow up therapy are one component of a multi-disciplinary discharge planning process, led by the attending physician.  Recommendations may be updated based on patient status, additional functional criteria and insurance authorization.  ? ?Follow Up Recommendations ? Skilled nursing-short term rehab (<3 hours/day)  ?  ?Assistance Recommended at Discharge Frequent or constant Supervision/Assistance  ?Patient can return home with the following A lot of help with walking and/or transfers;A lot of help with bathing/dressing/bathroom;Assistance with cooking/housework;Help with stairs or ramp for entrance;Assist for transportation ? ?  ?Functional Status Assessment ? Patient has had a recent decline in their functional status and demonstrates the ability to make significant improvements in function in a reasonable and predictable amount of time.  ?Equipment Recommendations ? BSC/3in1  ?  ?Recommendations for Other Services   ? ? ?  ?Precautions / Restrictions Precautions ?Precautions: Fall ?Precaution Comments: L shoulder in sling s/p reduction of anterior dislocation 04/30/21, secure chatted MD requesting parameters for use of LUE; fell just prior to admission ?Required Braces or Orthoses: Sling ?Restrictions ?Weight Bearing Restrictions: No  ? ?  ? ?Mobility Bed Mobility ?Overal bed mobility: Needs Assistance ?Bed Mobility: Supine to Sit, Sit to Supine ?  ?  ?Supine to sit: Max assist, HOB elevated ?Sit to supine: Mod assist ?  ?  ?  ? ?Transfers ?Overall transfer level: Needs assistance ?Equipment used: Rolling walker (2 wheels) ?Transfers: Sit to/from  Stand ?Sit to Stand: Mod assist ?  ?  ?  ?  ?  ?General transfer comment: Mod assist to power up. Able to place hands on walker - limiting weight bearing through LUE. Able to take a couple shuffling steps to head of bed. ?  ? ?  ?Balance Overall  balance assessment: Needs assistance ?Sitting-balance support: No upper extremity supported, Feet supported ?Sitting balance-Leahy Scale: Fair ?  ?  ?Standing balance support: During functional activity ?Standing balance-Leahy Scale: Poor ?Standing balance comment: reliant on walker ?  ?  ?  ?  ?  ?  ?  ?  ?  ?  ?  ?   ? ?ADL either performed or assessed with clinical judgement  ? ?ADL Overall ADL's : Needs assistance/impaired ?Eating/Feeding: Set up;Sitting ?Eating/Feeding Details (indicate cue type and reason): Patient found covered in his food - he was unable to open containers or reach his mouth well due to impairments with RUE (for the last 6 months) and no shoulder immobilized ?Grooming: Set up;Wash/dry face;Sitting ?  ?Upper Body Bathing: Moderate assistance;Sitting ?  ?Lower Body Bathing: Maximal assistance;Sit to/from stand ?  ?Upper Body Dressing : Maximal assistance;Sitting ?  ?Lower Body Dressing: Maximal assistance;Sit to/from stand ?  ?Toilet Transfer: Moderate assistance;BSC/3in1;Stand-pivot;Rolling walker (2 wheels) ?  ?Toileting- Clothing Manipulation and Hygiene: Total assistance;Sit to/from stand ?Toileting - Clothing Manipulation Details (indicate cue type and reason): found to have an external catheter, ?  ?  ?Functional mobility during ADLs: Moderate assistance ?   ? ? ? ?Vision   ?Vision Assessment?: No apparent visual deficits  ?   ?Perception   ?  ?Praxis   ?  ? ?Pertinent Vitals/Pain Pain Assessment ?Pain Assessment: No/denies pain  ? ? ? ?Hand Dominance Right ?  ?Extremity/Trunk Assessment Upper Extremity Assessment ?Upper Extremity Assessment: RUE deficits/detail;LUE deficits/detail ?RUE Deficits / Details: Reports right has been impaired for 6 months, approx 15 deg flex/abduct shoulder, gross abilty to internally rotate and externally rotate to neutral, unable to flex elbow- needs PROm, grossly functional wrist movement and grip ?RUE Sensation: WNL ?RUE Coordination: decreased fine  motor ?LUE Deficits / Details: Grossly able to abduct shoulder to approx 30 degrees but limited due to soreness and unsure of patient's precautions, elbow, wrist and hand functional ROM and strength ?LUE: Subluxation noted ?LUE Sensation: WNL ?LUE Coordination: WNL ?  ?Lower Extremity Assessment ?Lower Extremity Assessment: Defer to PT evaluation ?RLE Deficits / Details: difficult to assess 2* pt's lethargy, ankle DF/PF B  4/5, SLR 3/5 ?RLE Sensation: WNL ?LLE Deficits / Details: ankle DF/PF 4/5, SLR +2/5 ?LLE Sensation: WNL ?  ?Cervical / Trunk Assessment ?Cervical / Trunk Assessment: Kyphotic ?  ?Communication Communication ?Communication: No difficulties ?  ?Cognition Arousal/Alertness: Awake/alert ?Behavior During Therapy: [facility]FL for tasks assessed/performed ?Overall Cognitive Status: Within Functional Limits for tasks assessed ?  ?  ?  ?  ?  ?  ?  ?  ?  ?  ?  ?  ?  ?  ?  ?  ?  ?  ?  ?General Comments    ? ?  ?Exercises   ?  ?Shoulder Instructions    ? ? ?Home Living Family/patient expects to be discharged to:: Private residence ?Living Arrangements: Spouse/significant other ?  ?  ?Home Access: Stairs to enter ?Entrance Stairs-Number of Steps: 5 ?Entrance Stairs-Rails: Left;Right ?  ?  ?  ?Bathroom Shower/Tub: Tub/shower unit ?  ?  ?  ?  ?Home Equipment: Agricultural consultantolling Walker (2 wheels);Rollator (  4 wheels);Cane - single point ?  ?Additional Comments: lives with girlfriend who works during the day. Pt lethargic, difficult to arouse, responded minimally to questions about home setup and prior level of function. ?  ? ?  ?Prior Functioning/Environment Prior Level of Function : Independent/Modified Independent ?  ?  ?  ?  ?  ?  ?Mobility Comments: walked using either cane or RW ?ADLs Comments: struggled with ADLs ?  ? ?  ?  ?OT Problem List: Decreased strength;Decreased range of motion;Decreased activity tolerance;Impaired balance (sitting and/or standing);Decreased safety awareness;Decreased knowledge of use of DME or  AE;Decreased knowledge of precautions;Impaired UE functional use;Pain ?  ?   ?OT Treatment/Interventions: Self-care/ADL training;Therapeutic exercise;DME and/or AE instruction;Therapeutic activities;Balance traini

## 2021-05-02 MED ORDER — GABAPENTIN 300 MG PO CAPS
300.0000 mg | ORAL_CAPSULE | Freq: Two times a day (BID) | ORAL | Status: DC
  Administered 2021-05-02 – 2021-05-06 (×9): 300 mg via ORAL
  Filled 2021-05-02 (×9): qty 1

## 2021-05-02 MED ORDER — OYSTER SHELL CALCIUM/D3 500-5 MG-MCG PO TABS
1.0000 | ORAL_TABLET | Freq: Two times a day (BID) | ORAL | Status: DC
  Administered 2021-05-02 – 2021-05-06 (×7): 1 via ORAL
  Filled 2021-05-02 (×10): qty 1

## 2021-05-02 MED ORDER — THIAMINE HCL 100 MG PO TABS
100.0000 mg | ORAL_TABLET | Freq: Every day | ORAL | Status: DC
  Administered 2021-05-02 – 2021-05-06 (×5): 100 mg via ORAL
  Filled 2021-05-02 (×5): qty 1

## 2021-05-02 MED ORDER — LORATADINE 10 MG PO TABS
10.0000 mg | ORAL_TABLET | Freq: Every day | ORAL | Status: DC
  Administered 2021-05-02 – 2021-05-06 (×5): 10 mg via ORAL
  Filled 2021-05-02 (×5): qty 1

## 2021-05-02 MED ORDER — LAMOTRIGINE 100 MG PO TABS
200.0000 mg | ORAL_TABLET | Freq: Every day | ORAL | Status: DC
  Administered 2021-05-02 – 2021-05-06 (×5): 200 mg via ORAL
  Filled 2021-05-02 (×5): qty 2

## 2021-05-02 MED ORDER — LISINOPRIL 10 MG PO TABS
10.0000 mg | ORAL_TABLET | Freq: Every day | ORAL | Status: DC
  Administered 2021-05-02 – 2021-05-06 (×5): 10 mg via ORAL
  Filled 2021-05-02 (×5): qty 1

## 2021-05-02 MED ORDER — NALTREXONE HCL 50 MG PO TABS
50.0000 mg | ORAL_TABLET | Freq: Every day | ORAL | Status: DC
  Administered 2021-05-02 – 2021-05-06 (×4): 50 mg via ORAL
  Filled 2021-05-02 (×5): qty 1

## 2021-05-02 MED ORDER — ASPIRIN EC 81 MG PO TBEC
81.0000 mg | DELAYED_RELEASE_TABLET | Freq: Every day | ORAL | Status: DC
  Administered 2021-05-02 – 2021-05-06 (×5): 81 mg via ORAL
  Filled 2021-05-02 (×5): qty 1

## 2021-05-02 MED ORDER — DILTIAZEM HCL ER COATED BEADS 120 MG PO CP24
360.0000 mg | ORAL_CAPSULE | Freq: Every day | ORAL | Status: DC
  Administered 2021-05-02 – 2021-05-06 (×5): 360 mg via ORAL
  Filled 2021-05-02 (×5): qty 3

## 2021-05-02 MED ORDER — MELATONIN 3 MG PO TABS: 3.0000 mg | ORAL_TABLET | Freq: Every evening | ORAL | Status: DC | PRN

## 2021-05-02 MED ORDER — PANTOPRAZOLE SODIUM 40 MG PO TBEC
40.0000 mg | DELAYED_RELEASE_TABLET | Freq: Every day | ORAL | Status: DC
  Administered 2021-05-02 – 2021-05-06 (×5): 40 mg via ORAL
  Filled 2021-05-02 (×5): qty 1

## 2021-05-02 MED ORDER — OLANZAPINE 10 MG PO TABS
20.0000 mg | ORAL_TABLET | Freq: Every day | ORAL | Status: DC
Start: 2021-05-02 — End: 2021-05-07
  Administered 2021-05-03 – 2021-05-05 (×4): 20 mg via ORAL
  Filled 2021-05-02 (×4): qty 2

## 2021-05-02 MED ORDER — SODIUM CHLORIDE 1 G PO TABS
1.0000 g | ORAL_TABLET | Freq: Three times a day (TID) | ORAL | Status: DC
  Administered 2021-05-02 – 2021-05-06 (×12): 1 g via ORAL
  Filled 2021-05-02 (×20): qty 1

## 2021-05-02 NOTE — ED Notes (Signed)
Condom catheter bag emptied of 14106ml of clear yellow urine. ?

## 2021-05-02 NOTE — Progress Notes (Signed)
Pt's PASRR still pending.  ? ?Valentina Shaggy.Chares Slaymaker, MSW, LCSWA ?[facility]  Transitions of Care ?Clinical Social Worker I ?Direct Dial: [phone]  Fax: [phone] ?[email or web address]  ?

## 2021-05-02 NOTE — Progress Notes (Signed)
CSW uploaded updated FL2 and Signed Dementia note to [place] [facility] . Pt's PASRR has gone to a level 2 screening, interviewer is being assigned. TOC to follow.  ? ?Valentina Shaggy.Alishba Naples, MSW, LCSWA ?[facility]  Transitions of Care ?Clinical Social Worker I ?Direct Dial: [phone]  Fax: [phone] ?[email or web address]  ?

## 2021-05-02 NOTE — Progress Notes (Cosign Needed)
?  ? ?  RE: Billy Anderson ?Date of Birth: 12-10-1965 ?Date: 05/02/2021  ? ? ?To Whom It May Concern: ? ?Please be advised that the above-named patient has a primary diagnosis of dementia which supersedes any psychiatric diagnosis. ? ? ?      ?MD signature ? ?      ?Date ?

## 2021-05-02 NOTE — [place] FL2 (Signed)
? MEDICAID FL2 LEVEL OF CARE SCREENING TOOL  ?  ? ?IDENTIFICATION  ?Patient Name: ?Billy Anderson Birthdate: [birth year] Sex: male Admission Date (Current Location): ?04/30/2021  ?[place] Number: ? [place] ?[id number] M Facility and Address:  ?[facility],  [address] [place], [place] [address] ?     Provider Number: ?[id number]  ?Attending Physician Name and Address:  ? Madison Kommor,MD Relative Name and Phone Number:  ?[name] [phone])   [phone] [facility]) ?   ?Current Level of Care: ?Hospital Recommended Level of Care: ?Skilled Nursing Facility Prior Approval Number: ?  ? ?Date Approved/Denied: ?  PASRR Number: ?pending ? ?Discharge Plan: ?SNF ?  ? ?Current Diagnoses: ?Dislocation of left shoulder joint, initial encounter ? ? ?Orientation RESPIRATION BLADDER Height & Weight   ?  ?Self, Time, Situation, Place ? Normal Incontinent Weight: 185 lb (83.9 kg) ?Height:  6' (182.9 cm)  ?BEHAVIORAL SYMPTOMS/MOOD NEUROLOGICAL BOWEL NUTRITION STATUS  ?    Continent Diet (regular)  ?AMBULATORY STATUS COMMUNICATION OF NEEDS Skin   ?Extensive Assist Verbally Normal ?  ?  ?  ?    ?     ?     ? ? ?Personal Care Assistance Level of Assistance  ?Bathing, Feeding, Dressing Bathing Assistance: Limited assistance ?Feeding assistance: Independent ?Dressing Assistance: Limited assistance ?   ? ?Functional Limitations Info  ?Sight, Hearing, Speech Sight Info: Adequate ?Hearing Info: Adequate ?Speech Info: Adequate  ? ? ?SPECIAL CARE FACTORS FREQUENCY  ?PT (By licensed PT), OT (By licensed OT)   ?  ?PT Frequency: 5 x a week ?OT Frequency: 5 x a week ?  ?  ?  ?   ? ? ?Contractures Contractures Info: Not present  ? ? ?Additional Factors Info  ?Code Status, Allergies Code Status Info: full ?Allergies Info: Hydroxyzine,Oxycodone,Tramadol ?  ?  ?  ?   ? ?Current Medications (05/02/2021):  This is the current hospital active medication list ?Current Facility-Administered Medications  ?Medication Dose Route  Frequency Provider Last Rate Last Admin  ? acetaminophen (TYLENOL) tablet 650 mg  650 mg Oral Q6H PRN Terald Sleeper, MD   650 mg at 05/02/21 7412  ? aspirin EC tablet 81 mg  81 mg Oral Daily Long, Arlyss Repress, MD      ? calcium-vitamin D (OSCAL WITH D) 500-5 MG-MCG per tablet 1 tablet  1 tablet Oral BID Long, Arlyss Repress, MD      ? diltiazem (CARDIZEM CD) 24 hr capsule 360 mg  360 mg Oral Daily Long, Arlyss Repress, MD      ? gabapentin (NEURONTIN) capsule 300 mg  300 mg Oral BID Long, Arlyss Repress, MD      ? ibuprofen (ADVIL) tablet 600 mg  600 mg Oral Q6H PRN Terald Sleeper, MD      ? lamoTRIgine (LAMICTAL) tablet 200 mg  200 mg Oral Daily Long, Arlyss Repress, MD      ? lisinopril (ZESTRIL) tablet 10 mg  10 mg Oral Daily Long, Arlyss Repress, MD      ? loratadine (CLARITIN) tablet 10 mg  10 mg Oral Daily Long, Arlyss Repress, MD      ? melatonin tablet 3 mg  3 mg Oral QHS PRN Long, Arlyss Repress, MD      ? naltrexone (DEPADE) tablet 50 mg  50 mg Oral Daily Long, Arlyss Repress, MD      ? OLANZapine (ZYPREXA) tablet 20 mg  20 mg Oral QHS Long, Arlyss Repress, MD      ?  pantoprazole (PROTONIX) EC tablet 40 mg  40 mg Oral Daily Long, Arlyss Repress, MD      ? sodium chloride tablet 1 g  1 g Oral TID WC Long, Arlyss Repress, MD      ? thiamine tablet 100 mg  100 mg Oral Daily Long, Arlyss Repress, MD      ? ?Current Outpatient Medications  ?Medication Sig Dispense Refill  ? acetaminophen (TYLENOL) 500 MG tablet Take 1,000 mg by mouth every 6 (six) hours as needed for moderate pain.    ? Adalimumab (HUMIRA PEN [place]) Inject into the skin every 14 (fourteen) days. Every other Sunday    ? aspirin EC 81 MG tablet Take 81 mg by mouth daily. Swallow whole.    ? calcium-vitamin D (OSCAL WITH D) 500-5 MG-MCG tablet Take 1 tablet by mouth 2 (two) times daily.    ? cetirizine (ZYRTEC) 10 MG tablet Take 10 mg by mouth daily.    ? diltiazem (CARDIZEM CD) 360 MG 24 hr capsule Take 360 mg by mouth daily.    ? fluticasone (FLONASE) 50 MCG/ACT nasal spray Place 2 sprays into both nostrils daily  as needed for allergies.    ? gabapentin (NEURONTIN) 300 MG capsule Take 300 mg by mouth 2 (two) times daily.    ? lamoTRIgine (LAMICTAL) 100 MG tablet Take 200 mg by mouth at bedtime.    ? lisinopril (ZESTRIL) 10 MG tablet Take 10 mg by mouth daily.    ? magnesium oxide (MAG-OX) 400 MG tablet Take 800 mg by mouth 2 (two) times daily.    ? melatonin 3 MG TABS tablet Take 3 mg by mouth at bedtime as needed (for sleep).    ? Multiple Vitamin (MULTIVITAMIN) tablet Take 1 tablet by mouth 2 (two) times daily.    ? naltrexone (DEPADE) 50 MG tablet Take 50 mg by mouth daily.    ? OLANZapine (ZYPREXA) 20 MG tablet Take 20 mg by mouth at bedtime.    ? omeprazole (PRILOSEC) 20 MG capsule Take 20 mg by mouth daily.    ? Pyridoxine HCl (VITAMIN B6 PO) Take 1 tablet by mouth 2 (two) times daily.    ? sodium chloride 1 g tablet Take 1 g by mouth in the morning, at noon, in the evening, and at bedtime.    ? thiamine 100 MG tablet Take 100 mg by mouth daily.    ? ? ? ?Discharge Medications: ?Please see discharge summary for a list of discharge medications. ? ?Relevant Imaging Results: ? ?Relevant Lab Results: ? ? ?Additional Information ?[id number] ? ?Nekesha Font M Gaylynn Seiple, LCSW ? ? ? ? ?

## 2021-05-02 NOTE — ED Notes (Signed)
Pt had a large very loose BM that was a bit gelatinous and had the smell of cdiff. Pt states he has had cdiff twice in the past year. ?

## 2021-05-03 LAB — GASTROINTESTINAL PANEL BY PCR, STOOL (REPLACES STOOL CULTURE)

## 2021-05-03 NOTE — TOC Progression Note (Signed)
Transition of Care (TOC) - Progression Note  ? ? ?Patient Details  ?Name: Billy Anderson ?MRN: [id number] ?Date of Birth: 08/08/1965 ? ?Transition of Care (TOC) CM/SW Contact  ?Joanne Chars, LCSW ?Phone Number: ?05/03/2021, 8:28 AM ? ?Clinical Narrative:   Pt PASSR continues to be pending in [place].  Pt is waiting on face to face interview/assessment.  TOC will continue to follow.  ? ? ? ?  ?Barriers to Discharge: ED DME delivery ? ?Expected Discharge Plan and Services ?  ?  ?  ?  ?  ?                ?  ?  ?  ?  ?  ?  ?  ?  ?  ?  ? ? ?Social Determinants of Health (SDOH) Interventions ?  ? ?Readmission Risk Interventions ?   ? View : No data to display.  ?  ?  ?  ? ? ?

## 2021-05-04 NOTE — ED Provider Notes (Signed)
Emergency Medicine Observation Re-evaluation Note ? ?Billy Anderson is a 56 y.o. male, seen on rounds today.  Pt initially presented to the ED for complaints of Fall and Shoulder Injury ?Currently, the patient is awaiting placement. ? ?Physical Exam  ?BP 130/80   Pulse 70   Temp 98.4 ?F (36.9 ?C) (Oral)   Resp 17   Ht 6' (1.829 m)   Wt 83.9 kg   SpO2 95%   BMI 25.09 kg/m?  ?Physical Exam ?General: awake and alert ?Cardiac: rrr ?Lungs: cta b ?Psych: calm ? ?ED Course / MDM  ?EKG:  ? ?I have reviewed the labs performed to date as well as medications administered while in observation.  Recent changes in the last 24 hours include    Pt is still awaiting PASSR.  It is pending in [place].  Diarrhea has improved.  Stool studies were neg ? ?Plan  ?Current plan is for snf. ? Billy Anderson is not under involuntary commitment. ? ? ?  ?Jacalyn Lefevre, MD ?05/04/21 [phone] ? ?

## 2021-05-04 NOTE — ED Notes (Signed)
New condom cath applied and breakfast tray provided ?

## 2021-05-05 NOTE — Progress Notes (Addendum)
?Physical Therapy Treatment ?Patient Details ?Name: Billy Anderson ?MRN: [id number] ?DOB: [birth year] ?Today's Date: 05/05/2021 ? ? ?History of Present Illness 56 y.o. male presented to ED with left anterior shoulder dislocation from a fall. s/p L shoulder reduction in ED. No PMH available in chart other than chronic back pain. ? ?  ?PT Comments  ? ? The patient is quite unkempt in bed, LUE out of sling, appears difficulty with meals with lots of spilled  food.  ?The  patient requires mod assist for  sitting up onto bed edge, manages  his legs.  ? Adjusted the sling while in sitting and did not allow LUE use, much difficulty to stand up. ? Patient is noted to be very unsteady in standing with only RUe, noted legs are tremulous, almost ataxic. ? Patient clearly unable to function without 24/7 caregivers. Patient reports he does not know if he can afford SNF with  his check going to facility. Continue PT.  ?PER PT on 4/14, ER MD stated not limitations for LUE(This PT did not not see the info prior to working with patient so he was limited this visit.) ?  ? Recommendations for follow up therapy are one component of a multi-disciplinary discharge planning process, led by the attending physician.  Recommendations may be updated based on patient status, additional functional criteria and insurance authorization. ? ?Follow Up Recommendations ? Skilled nursing-short term rehab (<3 hours/day) ?  ?  ?Assistance Recommended at Discharge Frequent or constant Supervision/Assistance  ?Patient can return home with the following Two people to help with walking and/or transfers;A lot of help with bathing/dressing/bathroom;Direct supervision/assist for medications management;Assistance with cooking/housework;Assist for transportation;Help with stairs or ramp for entrance ?  ?Equipment Recommendations ? Wheelchair (measurements PT);Wheelchair cushion (measurements PT)  ?  ?Recommendations for Other Services   ? ? ?  ?Precautions /  Restrictions Precautions ?Precaution Comments: L shoulder in sling s/p reduction of anterior dislocation 04/30/21, no parameters for use of LUE; fell just prior to admission ?Required Braces or Orthoses: Sling ?Restrictions. Per PT 4/14, MD stated no restrictions, do not see MD documentation ?Other Position/Activity   ? ?Mobility ? Bed Mobility ?  ?Bed Mobility: Supine to Sit, Sit to Supine ?  ?  ?Supine to sit: HOB elevated, Mod assist ?Sit to supine: Mod assist ?  ?General bed mobility comments: patient able to move the legs to bed edge, required assistanc e with trunk to upright. Able to return to supine and put legs onto bed. ?  ? ?Transfers ?Overall transfer level: Needs assistance ?Equipment used: Rolling walker (2 wheels) ?  ?Sit to Stand: Mod assist, +2 safety/equipment ?  ?  ?  ?  ?  ?General transfer comment: Mod assist to power up. LUE in sling and did not use it. Able to take a couple shuffling steps to head of bed. Patient very shakey jittery, ataxic-like movements. ?  ? ?Ambulation/Gait ?  ?  ?  ?  ?  ?  ?  ?  ? ? ?Stairs ?  ?  ?  ?  ?  ? ? ?Wheelchair Mobility ?  ? ?Modified Rankin (Stroke Patients Only) ?  ? ? ?  ?Balance Overall balance assessment: Needs assistance ?  ?Sitting balance-Leahy Scale: Fair ?  ?  ?Standing balance support: During functional activity, Single extremity supported ?Standing balance-Leahy Scale: Poor ?Standing balance comment: patient very shakey and unsteady. ?  ?  ?  ?  ?  ?  ?  ?  ?  ?  ?  ?  ? ?  ?  Cognition Arousal/Alertness: Awake/alert ?Behavior During Therapy: [facility] for tasks assessed/performed ?Overall Cognitive Status: Within Functional Limits for tasks assessed ?  ?  ?  ?  ?  ?  ?  ?  ?  ?  ?  ?  ?  ?  ?  ?  ?  ?  ?  ? ?  ?Exercises   ? ?  ?General Comments   ?  ?  ? ?Pertinent Vitals/Pain Pain Assessment ?Pain Assessment: Faces ?Faces Pain Scale: Hurts even more ?Pain Location: low back with bed mobility ?Pain Descriptors / Indicators: Grimacing, Guarding ?Pain  Intervention(s): Limited activity within patient's tolerance, Monitored during session  ? ? ?Home Living   ?  ?  ?  ?  ?  ?  ?  ?  ?  ?   ?  ?Prior Function    ?  ?  ?   ? ?PT Goals (current goals can now be found in the care plan section) Progress towards PT goals: Progressing toward goals ? ?  ?Frequency ? ? ? Min 2X/week ? ? ? ?  ?PT Plan Current plan remains appropriate  ? ? ?Co-evaluation   ?  ?  ?  ?  ? ?  ?AM-PAC PT "6 Clicks" Mobility   ?Outcome Measure ? Help needed turning from your back to your side while in a flat bed without using bedrails?: A Lot ?Help needed moving from lying on your back to sitting on the side of a flat bed without using bedrails?: A Lot ?Help needed moving to and from a bed to a chair (including a wheelchair)?: Total ?Help needed standing up from a chair using your arms (e.g., wheelchair or bedside chair)?: Total ?Help needed to walk in hospital room?: Total ?Help needed climbing 3-5 steps with a railing? : Total ?6 Click Score: 8 ? ?  ?End of Session Equipment Utilized During Treatment: Gait belt ?Activity Tolerance: Patient tolerated treatment well ?Patient left: in bed;with call bell/phone within reach ?Nurse Communication: Mobility status;Patient requests pain meds ?PT Visit Diagnosis: Difficulty in walking, not elsewhere classified (R26.2);Muscle weakness (generalized) (M62.81);History of falling (Z91.81);Pain ?Pain - part of body: Shoulder ?  ? ? ?Time: [id number] ?PT Time Calculation (min) (ACUTE ONLY): 23 min ? ?Charges:  $Therapeutic Activity: 23-37 mins          ?          ? ?Tresa Endo PT ?Acute Rehabilitation Services ?Pager [phone] ?Office [phone] ? ? ? ?Britini Garcilazo, Shella Maxim ?05/05/2021, 11:47 AM ? ?

## 2021-05-05 NOTE — Progress Notes (Signed)
PASRR still pending.  ° °Jamonica Schoff M.Jorrell Kuster, MSW, LCSWA °[place]   Transitions of Care °Clinical Social Worker I °Direct Dial: [phone]   Fax: [phone] °[email or web address]  °

## 2021-05-05 NOTE — ED Notes (Signed)
Pt was found to be saturated in urine. Pt was provided with a bed bath. Bed sheets where changed. New gown/sock applied. Bedside cleaning.  ?

## 2021-05-05 NOTE — ED Provider Notes (Signed)
Emergency Medicine Observation Re-evaluation Note ? ?Billy Anderson is a 56 y.o. male, seen on rounds today.  Pt initially presented to the ED for complaints of Fall and Shoulder Injury ?Currently, the patient is asleep. ? ?Physical Exam  ?BP (!) 162/93   Pulse 68   Temp 98.2 ?F (36.8 ?C) (Oral)   Resp 19   Ht 6' (1.829 m)   Wt 83.9 kg   SpO2 96%   BMI 25.09 kg/m?  ?Physical Exam ?General: asleep, no distress ?Cardiac: deferred, asleep ?Lungs: normal effort ?Psych: deferred, asleep ? ?ED Course / MDM  ?EKG:  ? ?I have reviewed the labs performed to date as well as medications administered while in observation.  No recent changes in the last 24 hours. ? ?Plan  ?Current plan is for placement. ? Billy Anderson is not under involuntary commitment. ? ? ?  ?Pricilla Loveless, MD ?05/05/21 1430 ? ?

## 2021-05-06 NOTE — ED Notes (Signed)
Billy Anderson call and they will call me back for report. ? ?

## 2021-05-06 NOTE — Progress Notes (Signed)
PASARR complete ?  ?[id number] H # ? ?Pt to d/c to [facility] call report to [phone]. MD and RN aware.  ? ?Valentina Shaggy.Sanii Kukla, MSW, LCSWA ?[facility]  Transitions of Care ?Clinical Social Worker I ?Direct Dial: [phone]  Fax: [phone] ?[email or web address]  ?

## 2021-05-06 NOTE — ED Notes (Signed)
Rounded on pt. Pt currently denies any complaints and did not need any further assistance at this time.  ?

## 2021-05-22 ENCOUNTER — Telehealth: Admit: 2021-05-22 | Discharge: 2021-05-23 | Payer: MEDICAID

## 2021-06-09 ENCOUNTER — Telehealth
Admit: 2021-06-09 | Discharge: 2021-06-10 | Payer: MEDICAID | Attending: Student in an Organized Health Care Education/Training Program | Primary: Student in an Organized Health Care Education/Training Program

## 2021-06-09 DIAGNOSIS — F1021 Alcohol dependence, in remission: Principal | ICD-10-CM

## 2021-06-09 DIAGNOSIS — F319 Bipolar disorder, unspecified: Principal | ICD-10-CM

## 2021-06-09 DIAGNOSIS — Z79899 Other long term (current) drug therapy: Principal | ICD-10-CM

## 2021-06-09 MED ORDER — NALTREXONE 50 MG TABLET
ORAL_TABLET | Freq: Every day | ORAL | 1 refills | 30 days | Status: CP
Start: 2021-06-09 — End: ?

## 2021-06-09 MED ORDER — OLANZAPINE 20 MG TABLET
ORAL_TABLET | Freq: Every evening | ORAL | 1 refills | 30 days | Status: CP
Start: 2021-06-09 — End: ?

## 2021-06-09 MED ORDER — LAMOTRIGINE 100 MG TABLET
ORAL_TABLET | Freq: Every day | ORAL | 1 refills | 30 days | Status: CP
Start: 2021-06-09 — End: 2021-08-08

## 2021-08-03 MED ORDER — SODIUM CHLORIDE 1,000 MG SOLUBLE TABLET
ORAL_TABLET | 0 refills | 0 days
Start: 2021-08-03 — End: ?

## 2021-08-06 MED ORDER — SODIUM CHLORIDE 1,000 MG SOLUBLE TABLET
ORAL_TABLET | 0 refills | 0 days
Start: 2021-08-06 — End: ?

## 2021-08-11 ENCOUNTER — Telehealth: Admit: 2021-08-11 | Discharge: 2021-08-12 | Payer: MEDICAID

## 2021-08-11 DIAGNOSIS — F319 Bipolar disorder, unspecified: Principal | ICD-10-CM

## 2021-08-11 DIAGNOSIS — F1021 Alcohol dependence, in remission: Principal | ICD-10-CM

## 2021-08-11 DIAGNOSIS — F17200 Nicotine dependence, unspecified, uncomplicated: Principal | ICD-10-CM

## 2021-08-11 DIAGNOSIS — E871 Hypo-osmolality and hyponatremia: Principal | ICD-10-CM

## 2021-08-11 MED ORDER — SODIUM CHLORIDE 1,000 MG SOLUBLE TABLET
ORAL_TABLET | Freq: Three times a day (TID) | ORAL | 0 refills | 30 days | Status: CP
Start: 2021-08-11 — End: 2021-09-10

## 2021-08-11 MED ORDER — OLANZAPINE 20 MG TABLET
ORAL_TABLET | Freq: Every evening | ORAL | 1 refills | 30 days | Status: CP
Start: 2021-08-11 — End: ?

## 2021-08-11 MED ORDER — LAMOTRIGINE 100 MG TABLET
ORAL_TABLET | Freq: Every day | ORAL | 1 refills | 30 days | Status: CP
Start: 2021-08-11 — End: 2021-10-10

## 2021-08-11 MED ORDER — NALTREXONE 50 MG TABLET
ORAL_TABLET | Freq: Every day | ORAL | 1 refills | 30 days | Status: CP
Start: 2021-08-11 — End: ?

## 2021-08-12 MED ORDER — SODIUM CHLORIDE 1,000 MG SOLUBLE TABLET
ORAL_TABLET | Freq: Three times a day (TID) | ORAL | 0 refills | 30 days
Start: 2021-08-12 — End: 2021-09-11

## 2021-10-06 ENCOUNTER — Ambulatory Visit: Admit: 2021-10-06 | Payer: MEDICAID

## 2021-10-15 ENCOUNTER — Encounter (HOSPITAL_COMMUNITY): Payer: Self-pay

## 2021-10-15 ENCOUNTER — Inpatient Hospital Stay (HOSPITAL_COMMUNITY)
Admission: EM | Admit: 2021-10-15 | Discharge: 2021-10-22 | DRG: 543 | Disposition: A | Discharge status: Rehabilitation Facility | Payer: Medicaid Other | Attending: Student | Admitting: Student

## 2021-10-15 ENCOUNTER — Other Ambulatory Visit: Payer: Self-pay

## 2021-10-15 DIAGNOSIS — F319 Bipolar disorder, unspecified: Secondary | ICD-10-CM | POA: Diagnosis present

## 2021-10-15 DIAGNOSIS — Z91148 Patient's other noncompliance with medication regimen for other reason: Secondary | ICD-10-CM

## 2021-10-15 DIAGNOSIS — R251 Tremor, unspecified: Secondary | ICD-10-CM | POA: Diagnosis present

## 2021-10-15 DIAGNOSIS — S32050S Wedge compression fracture of fifth lumbar vertebra, sequela: Secondary | ICD-10-CM

## 2021-10-15 DIAGNOSIS — F3110 Bipolar disorder, current episode manic without psychotic features, unspecified: Secondary | ICD-10-CM | POA: Diagnosis present

## 2021-10-15 DIAGNOSIS — L4050 Arthropathic psoriasis, unspecified: Secondary | ICD-10-CM | POA: Diagnosis present

## 2021-10-15 DIAGNOSIS — F1021 Alcohol dependence, in remission: Secondary | ICD-10-CM

## 2021-10-15 DIAGNOSIS — D7589 Other specified diseases of blood and blood-forming organs: Secondary | ICD-10-CM | POA: Diagnosis present

## 2021-10-15 DIAGNOSIS — A498 Other bacterial infections of unspecified site: Secondary | ICD-10-CM | POA: Diagnosis present

## 2021-10-15 DIAGNOSIS — R17 Unspecified jaundice: Secondary | ICD-10-CM | POA: Diagnosis present

## 2021-10-15 DIAGNOSIS — M545 Low back pain, unspecified: Secondary | ICD-10-CM | POA: Diagnosis present

## 2021-10-15 DIAGNOSIS — Z888 Allergy status to other drugs, medicaments and biological substances status: Secondary | ICD-10-CM

## 2021-10-15 DIAGNOSIS — R262 Difficulty in walking, not elsewhere classified: Secondary | ICD-10-CM | POA: Diagnosis present

## 2021-10-15 DIAGNOSIS — R531 Weakness: Secondary | ICD-10-CM

## 2021-10-15 DIAGNOSIS — I10 Essential (primary) hypertension: Secondary | ICD-10-CM

## 2021-10-15 DIAGNOSIS — Z23 Encounter for immunization: Secondary | ICD-10-CM

## 2021-10-15 DIAGNOSIS — D696 Thrombocytopenia, unspecified: Principal | ICD-10-CM

## 2021-10-15 DIAGNOSIS — G40909 Epilepsy, unspecified, not intractable, without status epilepticus: Secondary | ICD-10-CM | POA: Diagnosis present

## 2021-10-15 DIAGNOSIS — F17210 Nicotine dependence, cigarettes, uncomplicated: Secondary | ICD-10-CM | POA: Diagnosis present

## 2021-10-15 DIAGNOSIS — F17200 Nicotine dependence, unspecified, uncomplicated: Secondary | ICD-10-CM | POA: Diagnosis present

## 2021-10-15 DIAGNOSIS — Z716 Tobacco abuse counseling: Secondary | ICD-10-CM

## 2021-10-15 DIAGNOSIS — D6959 Other secondary thrombocytopenia: Secondary | ICD-10-CM | POA: Diagnosis present

## 2021-10-15 DIAGNOSIS — M48061 Spinal stenosis, lumbar region without neurogenic claudication: Secondary | ICD-10-CM

## 2021-10-15 DIAGNOSIS — G8929 Other chronic pain: Secondary | ICD-10-CM | POA: Diagnosis present

## 2021-10-15 DIAGNOSIS — I480 Paroxysmal atrial fibrillation: Secondary | ICD-10-CM | POA: Diagnosis present

## 2021-10-15 DIAGNOSIS — R7401 Elevation of levels of liver transaminase levels: Secondary | ICD-10-CM

## 2021-10-15 DIAGNOSIS — R159 Full incontinence of feces: Secondary | ICD-10-CM | POA: Diagnosis present

## 2021-10-15 DIAGNOSIS — K219 Gastro-esophageal reflux disease without esophagitis: Secondary | ICD-10-CM | POA: Diagnosis present

## 2021-10-15 DIAGNOSIS — Z7982 Long term (current) use of aspirin: Secondary | ICD-10-CM

## 2021-10-15 DIAGNOSIS — F1090 Alcohol use, unspecified, uncomplicated: Secondary | ICD-10-CM

## 2021-10-15 DIAGNOSIS — F411 Generalized anxiety disorder: Secondary | ICD-10-CM | POA: Diagnosis present

## 2021-10-15 DIAGNOSIS — E871 Hypo-osmolality and hyponatremia: Secondary | ICD-10-CM

## 2021-10-15 DIAGNOSIS — Z885 Allergy status to narcotic agent status: Secondary | ICD-10-CM

## 2021-10-15 DIAGNOSIS — M4856XA Collapsed vertebra, not elsewhere classified, lumbar region, initial encounter for fracture: Principal | ICD-10-CM | POA: Diagnosis present

## 2021-10-15 DIAGNOSIS — Z79899 Other long term (current) drug therapy: Secondary | ICD-10-CM

## 2021-10-15 DIAGNOSIS — L409 Psoriasis, unspecified: Principal | ICD-10-CM

## 2021-10-15 LAB — CBC WITH DIFFERENTIAL/PLATELET
Abs Immature Granulocytes: 0.02 10*3/uL (ref 0.00–0.07)
Basophils Absolute: 0 10*3/uL (ref 0.0–0.1)
Basophils Relative: 1 %
Eosinophils Absolute: 0.1 10*3/uL (ref 0.0–0.5)
Eosinophils Relative: 2 %
HCT: 35.6 % — ABNORMAL LOW (ref 39.0–52.0)
Hemoglobin: 13 g/dL (ref 13.0–17.0)
Immature Granulocytes: 0 %
Lymphocytes Relative: 25 %
Lymphs Abs: 1.2 10*3/uL (ref 0.7–4.0)
MCH: 39.3 pg — ABNORMAL HIGH (ref 26.0–34.0)
MCHC: 36.5 g/dL — ABNORMAL HIGH (ref 30.0–36.0)
MCV: 107.6 fL — ABNORMAL HIGH (ref 80.0–100.0)
Monocytes Absolute: 0.6 10*3/uL (ref 0.1–1.0)
Monocytes Relative: 12 %
Neutro Abs: 2.9 10*3/uL (ref 1.7–7.7)
Neutrophils Relative %: 60 %
Platelets: 103 10*3/uL — ABNORMAL LOW (ref 150–400)
RBC: 3.31 MIL/uL — ABNORMAL LOW (ref 4.22–5.81)
RDW: 14.5 % (ref 11.5–15.5)
WBC: 4.7 10*3/uL (ref 4.0–10.5)
nRBC: 0 % (ref 0.0–0.2)

## 2021-10-15 LAB — COMPREHENSIVE METABOLIC PANEL
ALT: 43 U/L (ref 0–44)
AST: 56 U/L — ABNORMAL HIGH (ref 15–41)
Albumin: 3.2 g/dL — ABNORMAL LOW (ref 3.5–5.0)
Alkaline Phosphatase: 97 U/L (ref 38–126)
Anion gap: 13 (ref 5–15)
BUN: 7 mg/dL (ref 6–20)
CO2: 22 mmol/L (ref 22–32)
Calcium: 8.2 mg/dL — ABNORMAL LOW (ref 8.9–10.3)
Chloride: 97 mmol/L — ABNORMAL LOW (ref 98–111)
Creatinine, Ser: 0.51 mg/dL — ABNORMAL LOW (ref 0.61–1.24)
GFR, Estimated: >60 mL/min (ref >60)
Glucose, Bld: 87 mg/dL (ref 70–99)
Potassium: 3.5 mmol/L (ref 3.5–5.1)
Sodium: 132 mmol/L — ABNORMAL LOW (ref 135–145)
Total Bilirubin: 1.3 mg/dL — ABNORMAL HIGH (ref 0.3–1.2)
Total Protein: 6.5 g/dL (ref 6.5–8.1)

## 2021-10-15 LAB — PHOSPHORUS: Phosphorus: 2.6 mg/dL (ref 2.5–4.6)

## 2021-10-15 LAB — MAGNESIUM: Magnesium: 1.4 mg/dL — ABNORMAL LOW (ref 1.7–2.4)

## 2021-10-15 MED ORDER — HUMIRA SYRINGE CITRATE FREE 40 MG/0.4 ML
11 refills | 0.00000 days
Start: 2021-10-15 — End: ?

## 2021-10-15 MED ORDER — LORAZEPAM 1 MG PO TABS
1.0000 mg | ORAL_TABLET | ORAL | Status: AC | PRN
  Administered 2021-10-15 – 2021-10-17 (×3): 1 mg via ORAL
  Filled 2021-10-15 (×3): qty 1

## 2021-10-15 MED ORDER — LACTATED RINGERS IV BOLUS
1000.0000 mL | Freq: Once | INTRAVENOUS | Status: AC
  Administered 2021-10-15: 1000 mL via INTRAVENOUS

## 2021-10-15 MED ORDER — LORAZEPAM 2 MG/ML IJ SOLN
1.0000 mg | INTRAMUSCULAR | Status: AC | PRN
  Administered 2021-10-16: 2 mg via INTRAVENOUS
  Administered 2021-10-17: 1 mg via INTRAVENOUS
  Filled 2021-10-15 (×2): qty 1

## 2021-10-15 MED ORDER — THIAMINE MONONITRATE 100 MG PO TABS
100.0000 mg | ORAL_TABLET | Freq: Every day | ORAL | Status: DC
  Administered 2021-10-16 – 2021-10-22 (×7): 100 mg via ORAL
  Filled 2021-10-15 (×7): qty 1

## 2021-10-15 MED ORDER — ADULT MULTIVITAMIN W/MINERALS CH
1.0000 | ORAL_TABLET | Freq: Every day | ORAL | Status: DC
  Administered 2021-10-16 – 2021-10-22 (×7): 1 via ORAL
  Filled 2021-10-15 (×7): qty 1

## 2021-10-15 MED ORDER — FOLIC ACID 1 MG PO TABS
1.0000 mg | ORAL_TABLET | Freq: Every day | ORAL | Status: DC
  Administered 2021-10-16 – 2021-10-22 (×7): 1 mg via ORAL
  Filled 2021-10-15 (×7): qty 1

## 2021-10-15 MED ORDER — THIAMINE HCL 100 MG/ML IJ SOLN
100.0000 mg | Freq: Every day | INTRAMUSCULAR | Status: DC
  Filled 2021-10-15: qty 2

## 2021-10-15 NOTE — ED Provider Notes (Signed)
Billy Anderson assumed from Dr. Maylon Peppers, patient with diarrhea, weakness and possibly going into alcohol withdraw. Labs are pending. He is interested in detox.  I have reviewed and interpreted laboratory tests, my interpretation is macrocytosis and thrombocytopenia which are unchanged from prior, mild hyponatremia which is unchanged from prior and not clinically significant, hypomagnesemia and IV magnesium has been ordered, mild elevation of AST and bilirubin likely secondary to alcohol abuse.  Patient was advised of these findings, and attempt was made to discharge him.  However, apparently he was unable to walk which is why he was brought into the emergency department.  Attempts to ambulate him have been unsuccessful.  I feel he will need to be admitted for further evaluation of his weakness.  I have ordered a CK to see if he is having rhabdomyolysis.  Case is discussed with Dr. Bridgett Larsson of [facility], who agrees to admit the patient.  Results for orders placed or performed during the hospital encounter of 10/15/21  Comprehensive metabolic panel  Result Value Ref Range   Sodium 132 (L) 135 - 145 mmol/L   Potassium 3.5 3.5 - 5.1 mmol/L   Chloride 97 (L) 98 - 111 mmol/L   CO2 22 22 - 32 mmol/L   Glucose, Bld 87 70 - 99 mg/dL   BUN 7 6 - 20 mg/dL   Creatinine, Ser 0.51 (L) 0.61 - 1.24 mg/dL   Calcium 8.2 (L) 8.9 - 10.3 mg/dL   Total Protein 6.5 6.5 - 8.1 g/dL   Albumin 3.2 (L) 3.5 - 5.0 g/dL   AST 56 (H) 15 - 41 U/L   ALT 43 0 - 44 U/L   Alkaline Phosphatase 97 38 - 126 U/L   Total Bilirubin 1.3 (H) 0.3 - 1.2 mg/dL   GFR, Estimated >60 >60 mL/min   Anion gap 13 5 - 15  CBC with Differential  Result Value Ref Range   WBC 4.7 4.0 - 10.5 K/uL   RBC 3.31 (L) 4.22 - 5.81 MIL/uL   Hemoglobin 13.0 13.0 - 17.0 g/dL   HCT 35.6 (L) 39.0 - 52.0 %   MCV 107.6 (H) 80.0 - 100.0 fL   MCH 39.3 (H) 26.0 - 34.0 pg   MCHC 36.5 (H) 30.0 - 36.0 g/dL   RDW 14.5 11.5 - 15.5 %   Platelets 103 (L) 150 - 400 K/uL    nRBC 0.0 0.0 - 0.2 %   Neutrophils Relative % 60 %   Neutro Abs 2.9 1.7 - 7.7 K/uL   Lymphocytes Relative 25 %   Lymphs Abs 1.2 0.7 - 4.0 K/uL   Monocytes Relative 12 %   Monocytes Absolute 0.6 0.1 - 1.0 K/uL   Eosinophils Relative 2 %   Eosinophils Absolute 0.1 0.0 - 0.5 K/uL   Basophils Relative 1 %   Basophils Absolute 0.0 0.0 - 0.1 K/uL   Immature Granulocytes 0 %   Abs Immature Granulocytes 0.02 0.00 - 0.07 K/uL  Magnesium  Result Value Ref Range   Magnesium 1.4 (L) 1.7 - 2.4 mg/dL  Phosphorus  Result Value Ref Range   Phosphorus 2.6 2.5 - 4.6 mg/dL   No results found.   Delora Fuel, MD 76/28/31 [phone]

## 2021-10-15 NOTE — ED Provider Notes (Signed)
[facility]-EMERGENCY DEPT Provider Note   CSN: [id number] Arrival date & time: 10/15/21  2110     History  Chief Complaint  Patient presents with   Diarrhea    Billy Anderson is a 56 y.o. male.  Patient is a 56 year old male with a past medical history of seizure disorder, hypertension, alcohol use disorder and tobacco use presenting to the emergency department with generalized weakness and diarrhea.  The patient states that over the last few weeks he has had increasing weakness to the point where now he can hardly get out of bed on his own.  He states he was previously walking with a walker.  He states that over the last 3 days he has had diarrhea once per day.  He states that he was constipated prior to this but has not taken any stool softeners in the last few weeks.  He states that he is not been able to get up and make it to the toilet in time.  He denies any black or bloody stools.  He denies any dysuria or hematuria or urinary retention or incontinence.  He denies any numbness or tingling and states that he feels weak in both arms and legs.  He denies any headaches, chest pain or abdominal pain.  He is denies any nausea or vomiting.  He states that he has been drinking about 1 pint of liquor per day with his last drink earlier in the day today.  He states that he has had alcohol withdrawal and alcohol withdrawal seizures in the past.  He states that he is interested in physical rehab as well as detox.  The history is provided by the patient.  Diarrhea      Home Medications Prior to Admission medications   Medication Sig Start Date End Date Taking? Authorizing Provider  acetaminophen (TYLENOL) 500 MG tablet Take 1,000 mg by mouth every 6 (six) hours as needed for moderate pain.    [provider]  Adalimumab (HUMIRA PEN [place]) Inject into the skin every 14 (fourteen) days. Every other Sunday    [provider]  aspirin EC 81 MG tablet Take 81  mg by mouth daily. Swallow whole.    [provider]  calcium-vitamin D (OSCAL WITH D) 500-5 MG-MCG tablet Take 1 tablet by mouth 2 (two) times daily.    [provider]  cetirizine (ZYRTEC) 10 MG tablet Take 10 mg by mouth daily.    [provider]  diltiazem (CARDIZEM CD) 360 MG 24 hr capsule Take 360 mg by mouth daily.    [provider]  fluticasone (FLONASE) 50 MCG/ACT nasal spray Place 2 sprays into both nostrils daily as needed for allergies.    [provider]  gabapentin (NEURONTIN) 300 MG capsule Take 300 mg by mouth 2 (two) times daily.    [provider]  lamoTRIgine (LAMICTAL) 100 MG tablet Take 200 mg by mouth at bedtime.    [provider]  lisinopril (ZESTRIL) 10 MG tablet Take 10 mg by mouth daily.    [provider]  magnesium oxide (MAG-OX) 400 MG tablet Take 800 mg by mouth 2 (two) times daily.    [provider]  melatonin 3 MG TABS tablet Take 3 mg by mouth at bedtime as needed (for sleep).    [provider]  Multiple Vitamin (MULTIVITAMIN) tablet Take 1 tablet by mouth 2 (two) times daily.    [provider]  naltrexone (DEPADE) 50 MG tablet  Take 50 mg by mouth daily.    [provider]  OLANZapine (ZYPREXA) 20 MG tablet Take 20 mg by mouth at bedtime.    [provider]  omeprazole (PRILOSEC) 20 MG capsule Take 20 mg by mouth daily.    [provider]  Pyridoxine HCl (VITAMIN B6 PO) Take 1 tablet by mouth 2 (two) times daily.    [provider]  sodium chloride 1 g tablet Take 1 g by mouth in the morning, at noon, in the evening, and at bedtime.    [provider]  thiamine 100 MG tablet Take 100 mg by mouth daily.    [provider]      Allergies    Hydroxyzine, Oxycodone, and Tramadol    Review of Systems   Review of Systems  Gastrointestinal:  Positive for diarrhea.    Physical Exam Updated Vital Signs BP (!)  122/97   Pulse (!) 118   Temp 98.2 F (36.8 C)   Resp 16   Ht 6\' 1"  (1.854 m)   Wt 81.6 kg   SpO2 96%   BMI 23.75 kg/m  Physical Exam Vitals and nursing note reviewed.  Constitutional:      General: He is not in acute distress.    Appearance: Normal appearance. He is not diaphoretic.  HENT:     Head: Normocephalic and atraumatic.     Nose: Nose normal.     Mouth/Throat:     Mouth: Mucous membranes are moist.     Pharynx: Oropharynx is clear.  Eyes:     Extraocular Movements: Extraocular movements intact.     Conjunctiva/sclera: Conjunctivae normal.     Pupils: Pupils are equal, round, and reactive to light.  Cardiovascular:     Rate and Rhythm: Normal rate and regular rhythm.     Pulses: Normal pulses.     Heart sounds: Normal heart sounds.  Pulmonary:     Effort: Pulmonary effort is normal.     Breath sounds: Normal breath sounds.  Abdominal:     General: Abdomen is flat.     Palpations: Abdomen is soft.     Tenderness: There is no abdominal tenderness.  Musculoskeletal:        General: No tenderness or deformity. Normal range of motion.     Cervical back: Normal range of motion and neck supple.     Right lower leg: No edema.     Left lower leg: No edema.  Skin:    General: Skin is warm and dry.  Neurological:     General: No focal deficit present.     Mental Status: He is alert and oriented to person, place, and time.     Cranial Nerves: No cranial nerve deficit.     Sensory: No sensory deficit.     Motor: No weakness.  Psychiatric:        Mood and Affect: Mood normal.        Behavior: Behavior normal.     ED Results / Procedures / Treatments   Labs (all labs ordered are listed, but only abnormal results are displayed) Labs Reviewed  CBC WITH DIFFERENTIAL/PLATELET - Abnormal; Notable for the following components:      Result Value   RBC 3.31 (*)    HCT 35.6 (*)    MCV 107.6 (*)    MCH 39.3 (*)    MCHC 36.5 (*)    Platelets 103 (*)    All other  components within normal limits  COMPREHENSIVE METABOLIC PANEL  MAGNESIUM  PHOSPHORUS    EKG EKG Interpretation  Date/Time:  Wednesday October 15 2021 21:36:35 EDT Ventricular Rate:  74 PR Interval:  131 QRS Duration: 85 QT Interval:  365 QTC Calculation: 405 R Axis:   39 Text Interpretation: Sinus rhythm Atrial premature complexes Low voltage, precordial leads Abnormal R-wave progression, early transition No previous ECGs available Confirmed by Elayne Snare ([id number]) on 10/15/2021 9:51:32 PM  Radiology No results found.  Procedures Procedures    Medications Ordered in ED Medications  LORazepam (ATIVAN) tablet 1-4 mg (has no administration in time range)    Or  LORazepam (ATIVAN) injection 1-4 mg (has no administration in time range)  thiamine (VITAMIN B1) tablet 100 mg (has no administration in time range)    Or  thiamine (VITAMIN B1) injection 100 mg (has no administration in time range)  folic acid (FOLVITE) tablet 1 mg (has no administration in time range)  multivitamin with minerals tablet 1 tablet (has no administration in time range)  lactated ringers bolus 1,000 mL (has no administration in time range)    ED Course/ Medical Decision Making/ A&P Clinical Course as of 10/15/21 2318  Wed Oct 15, 2021  2259 Patient is increasing tachycardia with a heart rate now in the 110s.  He is resting in bed eating crackers, he does have some tremulous nests though does not appear diaphoretic or anxious and is not currently nauseous.  Unclear if this is alcohol withdrawal related or if he may be dehydrated.  He will be given fluids and will be trialed a dose of Ativan. [VK]  2317 Patient signed out to Dr. Preston Fleeting pending labs and reassessment. [VK]    Clinical Course User Index [VK] Rexford Maus, DO                           Medical Decision Making This patient presents to the ED with chief complaint(s) of generalized weakness with pertinent past medical history of  seizure disorder, hypertension, alcohol use disorder which further complicates the presenting complaint. The complaint involves an extensive differential diagnosis and also carries with it a high risk of complications and morbidity.    The differential diagnosis includes patient has no focal neurologic deficits on exam making CVA unlikely, considering possible dehydration, electrolyte abnormality, anemia, alcohol withdrawal, deconditioning  Additional history obtained: Additional history obtained from spouse Records reviewed reviewed ED records  ED Course and Reassessment: On patient's arrival to the emergency department he is awake and alert well-appearing without any focal neurologic deficits and does not appear severely dehydrated.  He will have labs performed to evaluate for the cause of his weakness.  He has no focal neurologic deficits making CVA unlikely.  Patient's initial CIWA is 5 and will continue to be monitored for signs of alcohol withdrawal.  Independent labs interpretation:  The following labs were independently interpreted: CBC stable, remainder of labs pending  Independent visualization of imaging: N/A  Consultation: - Consulted or discussed management/test interpretation w/ external professional: N/A     Amount and/or Complexity of Data Reviewed Labs: ordered.  Risk OTC drugs. Prescription drug management.          Final Clinical Impression(s) / ED Diagnoses Final diagnoses:  Thrombocytopenia (HCC)  Generalized weakness  Alcohol use disorder    Rx / DC Orders ED Discharge Orders     None         Rexford Maus, DO 10/15/21 2319

## 2021-10-15 NOTE — ED Triage Notes (Signed)
Pt to ED from home via [facility].  Pt has had diarrhea x 3 days and progressive weakness for past few weeks. Pt states he has been incontinent of urine and stool and is unable to stand due to weakness. Pts wife informed EMS of the following:  pt has not been getting up off couch for past 2 weeks, has been sleeping approximately 20hrs/day, has had constant tremors for past month, has not been able to take any medications d/t swallowing difficulty, has not been able to bathe or shower for past 6-7 weeks, pt has been urinating and defecating on self for past few days, has not been eating and has only been drinking a pint of whiskey a day.  Pt's wife, Darliss Ridgel, [phone].  Pt A&Ox3, disoriented to time, NAD noted.  EMS VS 142/78 HR=80 98% RA Cbg=116

## 2021-10-16 ENCOUNTER — Encounter (HOSPITAL_COMMUNITY): Payer: Self-pay | Admitting: Internal Medicine

## 2021-10-16 ENCOUNTER — Observation Stay (HOSPITAL_COMMUNITY): Payer: Medicaid Other

## 2021-10-16 DIAGNOSIS — D696 Thrombocytopenia, unspecified: Secondary | ICD-10-CM

## 2021-10-16 DIAGNOSIS — L4050 Arthropathic psoriasis, unspecified: Secondary | ICD-10-CM

## 2021-10-16 DIAGNOSIS — E871 Hypo-osmolality and hyponatremia: Secondary | ICD-10-CM

## 2021-10-16 DIAGNOSIS — I10 Essential (primary) hypertension: Secondary | ICD-10-CM | POA: Diagnosis not present

## 2021-10-16 DIAGNOSIS — A498 Other bacterial infections of unspecified site: Secondary | ICD-10-CM

## 2021-10-16 DIAGNOSIS — I480 Paroxysmal atrial fibrillation: Secondary | ICD-10-CM

## 2021-10-16 DIAGNOSIS — F411 Generalized anxiety disorder: Secondary | ICD-10-CM

## 2021-10-16 DIAGNOSIS — R262 Difficulty in walking, not elsewhere classified: Secondary | ICD-10-CM | POA: Diagnosis not present

## 2021-10-16 DIAGNOSIS — G8929 Other chronic pain: Secondary | ICD-10-CM

## 2021-10-16 DIAGNOSIS — F319 Bipolar disorder, unspecified: Secondary | ICD-10-CM

## 2021-10-16 DIAGNOSIS — R531 Weakness: Secondary | ICD-10-CM

## 2021-10-16 DIAGNOSIS — F17200 Nicotine dependence, unspecified, uncomplicated: Secondary | ICD-10-CM

## 2021-10-16 DIAGNOSIS — F1010 Alcohol abuse, uncomplicated: Secondary | ICD-10-CM

## 2021-10-16 DIAGNOSIS — F1021 Alcohol dependence, in remission: Secondary | ICD-10-CM

## 2021-10-16 DIAGNOSIS — M545 Low back pain, unspecified: Secondary | ICD-10-CM

## 2021-10-16 LAB — CBC WITH DIFFERENTIAL/PLATELET
Abs Immature Granulocytes: 0.02 10*3/uL (ref 0.00–0.07)
Basophils Absolute: 0 10*3/uL (ref 0.0–0.1)
Basophils Relative: 1 %
Eosinophils Absolute: 0.1 10*3/uL (ref 0.0–0.5)
Eosinophils Relative: 2 %
HCT: 34.1 % — ABNORMAL LOW (ref 39.0–52.0)
Hemoglobin: 12.4 g/dL — ABNORMAL LOW (ref 13.0–17.0)
Immature Granulocytes: 1 %
Lymphocytes Relative: 27 %
Lymphs Abs: 1.1 10*3/uL (ref 0.7–4.0)
MCH: 38 pg — ABNORMAL HIGH (ref 26.0–34.0)
MCHC: 36.4 g/dL — ABNORMAL HIGH (ref 30.0–36.0)
MCV: 104.6 fL — ABNORMAL HIGH (ref 80.0–100.0)
Monocytes Absolute: 0.5 10*3/uL (ref 0.1–1.0)
Monocytes Relative: 13 %
Neutro Abs: 2.4 10*3/uL (ref 1.7–7.7)
Neutrophils Relative %: 56 %
Platelets: 107 10*3/uL — ABNORMAL LOW (ref 150–400)
RBC: 3.26 MIL/uL — ABNORMAL LOW (ref 4.22–5.81)
RDW: 14.3 % (ref 11.5–15.5)
WBC: 4.2 10*3/uL (ref 4.0–10.5)
nRBC: 0 % (ref 0.0–0.2)

## 2021-10-16 LAB — COMPREHENSIVE METABOLIC PANEL
ALT: 40 U/L (ref 0–44)
AST: 55 U/L — ABNORMAL HIGH (ref 15–41)
Albumin: 3.1 g/dL — ABNORMAL LOW (ref 3.5–5.0)
Alkaline Phosphatase: 103 U/L (ref 38–126)
Anion gap: 11 (ref 5–15)
BUN: 7 mg/dL (ref 6–20)
CO2: 24 mmol/L (ref 22–32)
Calcium: 8.1 mg/dL — ABNORMAL LOW (ref 8.9–10.3)
Chloride: 97 mmol/L — ABNORMAL LOW (ref 98–111)
Creatinine, Ser: 0.58 mg/dL — ABNORMAL LOW (ref 0.61–1.24)
GFR, Estimated: >60 mL/min (ref >60)
Glucose, Bld: 108 mg/dL — ABNORMAL HIGH (ref 70–99)
Potassium: 3.6 mmol/L (ref 3.5–5.1)
Sodium: 132 mmol/L — ABNORMAL LOW (ref 135–145)
Total Bilirubin: 1.2 mg/dL (ref 0.3–1.2)
Total Protein: 6 g/dL — ABNORMAL LOW (ref 6.5–8.1)

## 2021-10-16 LAB — MAGNESIUM: Magnesium: 1.8 mg/dL (ref 1.7–2.4)

## 2021-10-16 LAB — HIV ANTIBODY (ROUTINE TESTING W REFLEX): HIV Screen 4th Generation wRfx: NONREACTIVE

## 2021-10-16 LAB — POC OCCULT BLOOD, ED: Fecal Occult Bld: POSITIVE — AB

## 2021-10-16 LAB — VITAMIN B12: Vitamin B-12: 659 pg/mL (ref 180–914)

## 2021-10-16 LAB — TSH: TSH: 0.86 u[IU]/mL (ref 0.350–4.500)

## 2021-10-16 LAB — CK: Total CK: 136 U/L (ref 49–397)

## 2021-10-16 MED ORDER — HUMIRA SYRINGE CITRATE FREE 40 MG/0.4 ML
SUBCUTANEOUS | 0 refills | 0.00000 days | Status: CP
Start: 2021-10-16 — End: ?

## 2021-10-16 MED ORDER — ACETAMINOPHEN 325 MG PO TABS
650.0000 mg | ORAL_TABLET | Freq: Four times a day (QID) | ORAL | Status: DC | PRN
  Administered 2021-10-16 – 2021-10-21 (×4): 650 mg via ORAL
  Filled 2021-10-16 (×4): qty 2

## 2021-10-16 MED ORDER — ACETAMINOPHEN 650 MG RE SUPP: 650.0000 mg | Freq: Four times a day (QID) | RECTAL | Status: DC | PRN

## 2021-10-16 MED ORDER — ONDANSETRON HCL 4 MG PO TABS: 4.0000 mg | ORAL_TABLET | Freq: Four times a day (QID) | ORAL | Status: DC | PRN

## 2021-10-16 MED ORDER — GABAPENTIN 300 MG PO CAPS
300.0000 mg | ORAL_CAPSULE | Freq: Two times a day (BID) | ORAL | Status: DC
  Administered 2021-10-16 – 2021-10-22 (×13): 300 mg via ORAL
  Filled 2021-10-16 (×13): qty 1

## 2021-10-16 MED ORDER — MELATONIN 5 MG PO TABS: 10.0000 mg | ORAL_TABLET | Freq: Every evening | ORAL | Status: DC | PRN

## 2021-10-16 MED ORDER — INFLUENZA VAC SPLIT QUAD 0.5 ML IM SUSY
0.5000 mL | PREFILLED_SYRINGE | INTRAMUSCULAR | Status: AC
  Administered 2021-10-17: 0.5 mL via INTRAMUSCULAR
  Filled 2021-10-16: qty 0.5

## 2021-10-16 MED ORDER — LAMOTRIGINE 25 MG PO TABS
25.0000 mg | ORAL_TABLET | Freq: Every day | ORAL | Status: DC
  Administered 2021-10-16 – 2021-10-20 (×5): 25 mg via ORAL
  Filled 2021-10-16 (×5): qty 1

## 2021-10-16 MED ORDER — MAGNESIUM SULFATE 2 GM/50ML IV SOLN
2.0000 g | Freq: Once | INTRAVENOUS | Status: AC
  Administered 2021-10-16: 2 g via INTRAVENOUS
  Filled 2021-10-16: qty 50

## 2021-10-16 MED ORDER — LAMOTRIGINE 25 MG PO TABS: 50.0000 mg | ORAL_TABLET | Freq: Every day | ORAL | Status: DC

## 2021-10-16 MED ORDER — VITAMIN B-6 100 MG PO TABS
100.0000 mg | ORAL_TABLET | Freq: Every day | ORAL | Status: DC
  Administered 2021-10-16 – 2021-10-22 (×7): 100 mg via ORAL
  Filled 2021-10-16 (×7): qty 1

## 2021-10-16 MED ORDER — GADOPICLENOL 0.5 MMOL/ML IV SOLN
8.0000 mL | Freq: Once | INTRAVENOUS | Status: AC | PRN
  Administered 2021-10-16: 8 mL via INTRAVENOUS

## 2021-10-16 MED ORDER — LAMOTRIGINE 100 MG PO TABS: 200.0000 mg | ORAL_TABLET | Freq: Every day | ORAL | Status: DC

## 2021-10-16 MED ORDER — PNEUMOCOCCAL 20-VAL CONJ VACC 0.5 ML IM SUSY
0.5000 mL | PREFILLED_SYRINGE | INTRAMUSCULAR | Status: DC
  Filled 2021-10-16 (×2): qty 0.5

## 2021-10-16 MED ORDER — LAMOTRIGINE 100 MG PO TABS: 100.0000 mg | ORAL_TABLET | Freq: Every day | ORAL | Status: DC

## 2021-10-16 MED ORDER — LABETALOL HCL 5 MG/ML IV SOLN
10.0000 mg | INTRAVENOUS | Status: DC | PRN
  Filled 2021-10-16: qty 4

## 2021-10-16 MED ORDER — NICOTINE 21 MG/24HR TD PT24
21.0000 mg | MEDICATED_PATCH | Freq: Every day | TRANSDERMAL | Status: DC
  Administered 2021-10-16 – 2021-10-22 (×7): 21 mg via TRANSDERMAL
  Filled 2021-10-16 (×7): qty 1

## 2021-10-16 MED ORDER — CYANOCOBALAMIN 1000 MCG/ML IJ SOLN
1000.0000 ug | Freq: Every day | INTRAMUSCULAR | Status: DC
  Administered 2021-10-16 – 2021-10-22 (×6): 1000 ug via SUBCUTANEOUS
  Filled 2021-10-16 (×8): qty 1

## 2021-10-16 MED ORDER — OLANZAPINE 5 MG PO TABS
20.0000 mg | ORAL_TABLET | Freq: Every day | ORAL | Status: DC
  Administered 2021-10-16 – 2021-10-21 (×6): 20 mg via ORAL
  Filled 2021-10-16 (×6): qty 4

## 2021-10-16 MED ORDER — ONDANSETRON HCL 4 MG/2ML IJ SOLN: 4.0000 mg | Freq: Four times a day (QID) | INTRAMUSCULAR | Status: DC | PRN

## 2021-10-16 NOTE — Assessment & Plan Note (Signed)
Mild. Check TSH. Unknown if pt taking any meds that could cause hyponatremia. Will need detailed med rec by pharmacy tech with cross verification with outpatient pharmacy.

## 2021-10-16 NOTE — H&P (Addendum)
History and Physical    Billy Anderson [id number] DOB: 09-20-1965 DOA: 10/15/2021  DOS: the patient was seen and examined on 10/15/2021  PCP: Pcp, No   Patient coming from: Home  I have personally briefly reviewed patient's old medical records in [place]  CC: diarrhea x 3 days, weakness x 2 weeks HPI: 56 year old Caucasian male with reported history of hypertension, alcohol abuse presents to the ER today with a 2-week history of weakness and 3 days of diarrhea.  Patient himself is a very poor historian.  Entirety of his history is obtained from triage notes and a written chief complaint statement from his significant other.  He himself cannot provide any other history other than that he has been having diarrhea.  He does not know really for how long.  He states he has been weak but cannot describe the duration of his weakness.  Work-up in the ER was unremarkable.  However when the nurses tried to stand him up to discharge him, he was unable to bear any weight in his legs.  Patient's significant other has since left the ER and is unreachable.      ED Course: workup unremarkable aside from platelet count of 103. Magnesium of 1.4(repleted in ER), macrocytosis noted on CBC  Review of Systems:  Review of Systems  Constitutional: Negative.   HENT: Negative.    Eyes: Negative.   Respiratory: Negative.    Cardiovascular: Negative.   Gastrointestinal:  Positive for diarrhea.  Genitourinary: Negative.   Musculoskeletal: Negative.   Skin: Negative.   Neurological:  Positive for tremors and weakness.  Endo/Heme/Allergies: Negative.   Psychiatric/Behavioral:  Positive for substance abuse.   All other systems reviewed and are negative.   Past Medical History:  Diagnosis Date   Seizures [facility])     Past Surgical History:  Procedure Laterality Date   HAND SURGERY Left      reports that he has been smoking cigarettes. He has been smoking an average of 1 pack per day. He  does not have any smokeless tobacco history on file. He reports current alcohol use of about 48.0 standard drinks of alcohol per week. He reports that he does not use drugs.  Allergies  Allergen Reactions   Hydroxyzine Other (See Comments)    Per paperwork, sedation, no effect on anxiety   Oxycodone Itching   Tramadol Other (See Comments)    Per pt risk of seizures    History reviewed. No pertinent family history.  Prior to Admission medications   Medication Sig Start Date End Date Taking? Authorizing Provider  acetaminophen (TYLENOL) 500 MG tablet Take 1,000 mg by mouth every 6 (six) hours as needed for moderate pain.    [provider]  Adalimumab (HUMIRA PEN [place]) Inject into the skin every 14 (fourteen) days. Every other Sunday    [provider]  aspirin EC 81 MG tablet Take 81 mg by mouth daily. Swallow whole.    [provider]  calcium-vitamin D (OSCAL WITH D) 500-5 MG-MCG tablet Take 1 tablet by mouth 2 (two) times daily.    [provider]  cetirizine (ZYRTEC) 10 MG tablet Take 10 mg by mouth daily.    [provider]  diltiazem (CARDIZEM CD) 360 MG 24 hr capsule Take 360 mg by mouth daily.    [provider]  fluticasone (FLONASE) 50 MCG/ACT nasal spray Place 2 sprays into both nostrils daily as needed for allergies.    [provider]  gabapentin (  NEURONTIN) 300 MG capsule Take 300 mg by mouth 2 (two) times daily.    [provider]  lamoTRIgine (LAMICTAL) 100 MG tablet Take 200 mg by mouth at bedtime.    [provider]  lisinopril (ZESTRIL) 10 MG tablet Take 10 mg by mouth daily.    [provider]  magnesium oxide (MAG-OX) 400 MG tablet Take 800 mg by mouth 2 (two) times daily.    [provider]  melatonin 3 MG TABS tablet Take 3 mg by mouth at bedtime as needed (for sleep).    [provider]  Multiple Vitamin (MULTIVITAMIN) tablet Take 1 tablet by mouth 2 (two) times  daily.    [provider]  naltrexone (DEPADE) 50 MG tablet Take 50 mg by mouth daily.    [provider]  OLANZapine (ZYPREXA) 20 MG tablet Take 20 mg by mouth at bedtime.    [provider]  omeprazole (PRILOSEC) 20 MG capsule Take 20 mg by mouth daily.    [provider]  Pyridoxine HCl (VITAMIN B6 PO) Take 1 tablet by mouth 2 (two) times daily.    [provider]  sodium chloride 1 g tablet Take 1 g by mouth in the morning, at noon, in the evening, and at bedtime.    [provider]  thiamine 100 MG tablet Take 100 mg by mouth daily.    [provider]    Physical Exam: Vitals:   10/16/21 0000 10/16/21 0030 10/16/21 0100 10/16/21 0130  BP: (!) 143/86 (!) 115/58 (!) 130/54 121/64  Pulse: 78 77 95 86  Resp: 16 16 16 19   Temp:    97.8 F (36.6 C)  SpO2: 100% 97% 96% 95%  Weight:      Height:        Physical Exam Vitals and nursing note reviewed.  Constitutional:      Comments: Disheveled, smells of stool and stale urine  HENT:     Head: Normocephalic and atraumatic.     Nose: Nose normal.  Eyes:     General: No scleral icterus. Cardiovascular:     Rate and Rhythm: Normal rate and regular rhythm.  Pulmonary:     Effort: Pulmonary effort is normal.  Abdominal:     General: Abdomen is flat. Bowel sounds are normal.     Palpations: Abdomen is soft.     Tenderness: There is no abdominal tenderness.     Hernia: No hernia is present.  Musculoskeletal:     Right lower leg: No edema.     Left lower leg: No edema.     Comments: Moves all extremities spontaneously  Skin:    General: Skin is warm and dry.     Capillary Refill: Capillary refill takes less than 2 seconds.  Neurological:     Mental Status: He is alert. He is disoriented.      Labs on Admission: I have personally reviewed following labs and imaging studies  CBC: Recent Labs  Lab 10/15/21 2205  WBC 4.7  NEUTROABS 2.9  HGB 13.0  HCT 35.6*  MCV  107.6*  PLT [id number]*   Basic Metabolic Panel: Recent Labs  Lab 10/15/21 2205  NA 132*  K 3.5  CL 97*  CO2 22  GLUCOSE 87  BUN 7  CREATININE 0.51*  CALCIUM 8.2*  MG 1.4*  PHOS 2.6   GFR: Estimated Creatinine Clearance: 115.1 mL/min (A) (by C-G formula based on SCr of 0.51 mg/dL (L)). Liver Function Tests: Recent Labs  Lab 10/15/21 2205  AST 56*  ALT 43  ALKPHOS 97  BILITOT 1.3*  PROT 6.5  ALBUMIN 3.2*   No results for input(s): "LIPASE", "AMYLASE" in the last 168 hours. No results for input(s): "AMMONIA" in the last 168 hours. Coagulation Profile: No results for input(s): "INR", "PROTIME" in the last 168 hours. Cardiac Enzymes: Recent Labs  Lab 10/15/21 2205  CKTOTAL 136   BNP (last 3 results) No results for input(s): "PROBNP" in the last 8760 hours. HbA1C: No results for input(s): "HGBA1C" in the last 72 hours. CBG: No results for input(s): "GLUCAP" in the last 168 hours. Lipid Profile: No results for input(s): "CHOL", "HDL", "LDLCALC", "TRIG", "CHOLHDL", "LDLDIRECT" in the last 72 hours. Thyroid Function Tests: No results for input(s): "TSH", "T4TOTAL", "FREET4", "T3FREE", "THYROIDAB" in the last 72 hours. Anemia Panel: No results for input(s): "VITAMINB12", "FOLATE", "FERRITIN", "TIBC", "IRON", "RETICCTPCT" in the last 72 hours. Urine analysis: No results found for: "COLORURINE", "APPEARANCEUR", "LABSPEC", "PHURINE", "GLUCOSEU", "HGBUR", "BILIRUBINUR", "KETONESUR", "PROTEINUR", "UROBILINOGEN", "NITRITE", "LEUKOCYTESUR"  Radiological Exams on Admission: I have personally reviewed images No results found.  EKG: My personal interpretation of EKG shows: NSR    Assessment/Plan Principal Problem:   Cannot walk Active Problems:   Alcohol abuse   Thrombocytopenia (HCC)   Essential hypertension   Hyponatremia    Assessment and Plan: * Cannot walk Observation medical bed. No focal weakness. Has generalized bilateral LE weakness with bearing weight. No  pain. Could be related to lack of walking the last 2 weeks vs effects from alcoholism. Continue with M46 and folic acid. PT evaluation.  Also, will need Patient Registration to re-check his government issues ID for correct patient name/DOB. Pt states he has been admitted multiple times to [facility], but pt does not have any records in [place]. I suspect correct patient information causing this lack of ability to view his [facility] records.  Essential hypertension Stable. Pt does not know the names of any of the meds he takes. Will need updated MAR by med rec tech and cross referenced with his outpatient pharmacy.  Thrombocytopenia ([place]) Likely due to his chronic alcohol use. SCD for DVT prophylaxis.  Alcohol abuse CIWA protocol with IV/PO ativan prn.  Hyponatremia Mild. Check TSH. Unknown if pt taking any meds that could cause hyponatremia. Will need detailed med rec by pharmacy tech with cross verification with outpatient pharmacy.   DVT prophylaxis: SCDs Code Status: Full Code Family Communication: no family at bedside  Disposition Plan: return home  Consults called: none  Admission status: Observation, Med-Surg   Kristopher Oppenheim, DO [facility] 10/16/2021, 2:09 AM

## 2021-10-16 NOTE — ED Notes (Signed)
Consulting MD at bedside

## 2021-10-16 NOTE — Assessment & Plan Note (Signed)
CIWA protocol with IV/PO ativan prn.

## 2021-10-16 NOTE — Assessment & Plan Note (Signed)
Stable. Pt does not know the names of any of the meds he takes. Will need updated MAR by med rec tech and cross referenced with his outpatient pharmacy.

## 2021-10-16 NOTE — ED Notes (Signed)
Attempted to get pt up to ambulate.  Unable to get pt up w/staff assist x 2.  Pt very weak, unable to stand. Pt has skin excoriation on buttocks and scrotum, has non-blanching redness on sacrum.  Barrier cream applied to buttocks/perineal area.

## 2021-10-16 NOTE — Assessment & Plan Note (Signed)
Likely due to his chronic alcohol use. SCD for DVT prophylaxis.
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
## 2021-10-16 NOTE — Assessment & Plan Note (Signed)
Observation medical bed. No focal weakness. Has generalized bilateral LE weakness with bearing weight. No pain. Could be related to lack of walking the last 2 weeks vs effects from alcoholism. Continue with M63 and folic acid. PT evaluation.  Also, will need Patient Registration to re-check his government issues ID for correct patient name/DOB. Pt states he has been admitted multiple times to [facility], but pt does not have any records in [place]. I suspect correct patient information causing this lack of ability to view his [facility] records.
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
## 2021-10-16 NOTE — Subjective & Objective (Addendum)
CC: diarrhea x 3 days, weakness x 2 weeks HPI: 56 year old Caucasian male with reported history of hypertension, alcohol abuse presents to the ER today with a 2-week history of weakness and 3 days of diarrhea.  Patient himself is a very poor historian.  Entirety of his history is obtained from triage notes and a written chief complaint statement from his significant other.  He himself cannot provide any other history other than that he has been having diarrhea.  He does not know really for how long.  He states he has been weak but cannot describe the duration of his weakness.  Work-up in the ER was unremarkable.  However when the nurses tried to stand him up to discharge him, he was unable to bear any weight in his legs.  Patient's significant other has since left the ER and is unreachable.

## 2021-10-17 DIAGNOSIS — R251 Tremor, unspecified: Secondary | ICD-10-CM | POA: Diagnosis present

## 2021-10-17 DIAGNOSIS — M4856XA Collapsed vertebra, not elsewhere classified, lumbar region, initial encounter for fracture: Secondary | ICD-10-CM | POA: Diagnosis present

## 2021-10-17 DIAGNOSIS — K922 Gastrointestinal hemorrhage, unspecified: Secondary | ICD-10-CM | POA: Diagnosis not present

## 2021-10-17 DIAGNOSIS — D696 Thrombocytopenia, unspecified: Secondary | ICD-10-CM | POA: Diagnosis present

## 2021-10-17 DIAGNOSIS — Z885 Allergy status to narcotic agent status: Secondary | ICD-10-CM | POA: Diagnosis not present

## 2021-10-17 DIAGNOSIS — R790 Abnormal level of blood mineral: Secondary | ICD-10-CM | POA: Diagnosis not present

## 2021-10-17 DIAGNOSIS — R159 Full incontinence of feces: Secondary | ICD-10-CM | POA: Diagnosis present

## 2021-10-17 DIAGNOSIS — M48061 Spinal stenosis, lumbar region without neurogenic claudication: Secondary | ICD-10-CM | POA: Diagnosis present

## 2021-10-17 DIAGNOSIS — F319 Bipolar disorder, unspecified: Secondary | ICD-10-CM | POA: Diagnosis present

## 2021-10-17 DIAGNOSIS — Z7982 Long term (current) use of aspirin: Secondary | ICD-10-CM | POA: Diagnosis not present

## 2021-10-17 DIAGNOSIS — F3110 Bipolar disorder, current episode manic without psychotic features, unspecified: Secondary | ICD-10-CM | POA: Diagnosis not present

## 2021-10-17 DIAGNOSIS — M48062 Spinal stenosis, lumbar region with neurogenic claudication: Secondary | ICD-10-CM | POA: Diagnosis not present

## 2021-10-17 DIAGNOSIS — G40909 Epilepsy, unspecified, not intractable, without status epilepticus: Secondary | ICD-10-CM | POA: Diagnosis present

## 2021-10-17 DIAGNOSIS — F1010 Alcohol abuse, uncomplicated: Secondary | ICD-10-CM | POA: Diagnosis not present

## 2021-10-17 DIAGNOSIS — R17 Unspecified jaundice: Secondary | ICD-10-CM | POA: Diagnosis present

## 2021-10-17 DIAGNOSIS — R197 Diarrhea, unspecified: Secondary | ICD-10-CM | POA: Diagnosis not present

## 2021-10-17 DIAGNOSIS — F411 Generalized anxiety disorder: Secondary | ICD-10-CM | POA: Diagnosis present

## 2021-10-17 DIAGNOSIS — D7589 Other specified diseases of blood and blood-forming organs: Secondary | ICD-10-CM | POA: Diagnosis present

## 2021-10-17 DIAGNOSIS — L4050 Arthropathic psoriasis, unspecified: Secondary | ICD-10-CM | POA: Diagnosis present

## 2021-10-17 DIAGNOSIS — R7401 Elevation of levels of liver transaminase levels: Secondary | ICD-10-CM

## 2021-10-17 DIAGNOSIS — E871 Hypo-osmolality and hyponatremia: Secondary | ICD-10-CM | POA: Diagnosis present

## 2021-10-17 DIAGNOSIS — M545 Low back pain, unspecified: Secondary | ICD-10-CM | POA: Diagnosis not present

## 2021-10-17 DIAGNOSIS — F17210 Nicotine dependence, cigarettes, uncomplicated: Secondary | ICD-10-CM | POA: Diagnosis present

## 2021-10-17 DIAGNOSIS — G8929 Other chronic pain: Secondary | ICD-10-CM | POA: Diagnosis present

## 2021-10-17 DIAGNOSIS — D6959 Other secondary thrombocytopenia: Secondary | ICD-10-CM | POA: Diagnosis present

## 2021-10-17 DIAGNOSIS — I10 Essential (primary) hypertension: Secondary | ICD-10-CM | POA: Diagnosis present

## 2021-10-17 DIAGNOSIS — R262 Difficulty in walking, not elsewhere classified: Secondary | ICD-10-CM | POA: Diagnosis not present

## 2021-10-17 DIAGNOSIS — S32050G Wedge compression fracture of fifth lumbar vertebra, subsequent encounter for fracture with delayed healing: Secondary | ICD-10-CM

## 2021-10-17 DIAGNOSIS — Z79899 Other long term (current) drug therapy: Secondary | ICD-10-CM | POA: Diagnosis not present

## 2021-10-17 DIAGNOSIS — Z23 Encounter for immunization: Secondary | ICD-10-CM | POA: Diagnosis not present

## 2021-10-17 DIAGNOSIS — K219 Gastro-esophageal reflux disease without esophagitis: Secondary | ICD-10-CM | POA: Diagnosis present

## 2021-10-17 DIAGNOSIS — F1021 Alcohol dependence, in remission: Secondary | ICD-10-CM | POA: Diagnosis present

## 2021-10-17 DIAGNOSIS — I480 Paroxysmal atrial fibrillation: Secondary | ICD-10-CM | POA: Diagnosis present

## 2021-10-17 DIAGNOSIS — S32050S Wedge compression fracture of fifth lumbar vertebra, sequela: Secondary | ICD-10-CM

## 2021-10-17 DIAGNOSIS — K5900 Constipation, unspecified: Secondary | ICD-10-CM | POA: Diagnosis not present

## 2021-10-17 LAB — COMPREHENSIVE METABOLIC PANEL
ALT: 64 U/L — ABNORMAL HIGH (ref 0–44)
AST: 149 U/L — ABNORMAL HIGH (ref 15–41)
Albumin: 3 g/dL — ABNORMAL LOW (ref 3.5–5.0)
Alkaline Phosphatase: 104 U/L (ref 38–126)
Anion gap: 8 (ref 5–15)
BUN: 8 mg/dL (ref 6–20)
CO2: 30 mmol/L (ref 22–32)
Calcium: 8.2 mg/dL — ABNORMAL LOW (ref 8.9–10.3)
Chloride: 97 mmol/L — ABNORMAL LOW (ref 98–111)
Creatinine, Ser: 0.7 mg/dL (ref 0.61–1.24)
GFR, Estimated: >60 mL/min (ref >60)
Glucose, Bld: 109 mg/dL — ABNORMAL HIGH (ref 70–99)
Potassium: 3.7 mmol/L (ref 3.5–5.1)
Sodium: 135 mmol/L (ref 135–145)
Total Bilirubin: 1.8 mg/dL — ABNORMAL HIGH (ref 0.3–1.2)
Total Protein: 6.1 g/dL — ABNORMAL LOW (ref 6.5–8.1)

## 2021-10-17 LAB — IRON AND TIBC
Iron: 72 ug/dL (ref 45–182)
Saturation Ratios: 44 % — ABNORMAL HIGH (ref 17.9–39.5)
TIBC: 162 ug/dL — ABNORMAL LOW (ref 250–450)
UIBC: 90 ug/dL

## 2021-10-17 LAB — RPR: RPR Ser Ql: NONREACTIVE

## 2021-10-17 LAB — CBC
HCT: 35.8 % — ABNORMAL LOW (ref 39.0–52.0)
Hemoglobin: 12.9 g/dL — ABNORMAL LOW (ref 13.0–17.0)
MCH: 39.6 pg — ABNORMAL HIGH (ref 26.0–34.0)
MCHC: 36 g/dL (ref 30.0–36.0)
MCV: 109.8 fL — ABNORMAL HIGH (ref 80.0–100.0)
Platelets: 98 10*3/uL — ABNORMAL LOW (ref 150–400)
RBC: 3.26 MIL/uL — ABNORMAL LOW (ref 4.22–5.81)
RDW: 14.4 % (ref 11.5–15.5)
WBC: 4.1 10*3/uL (ref 4.0–10.5)
nRBC: 0 % (ref 0.0–0.2)

## 2021-10-17 LAB — VITAMIN B12: Vitamin B-12: 2320 pg/mL — ABNORMAL HIGH (ref 180–914)

## 2021-10-17 LAB — RETICULOCYTES
Immature Retic Fract: 18 % — ABNORMAL HIGH (ref 2.3–15.9)
RBC.: 3.3 MIL/uL — ABNORMAL LOW (ref 4.22–5.81)
Retic Count, Absolute: 71 10*3/uL (ref 19.0–186.0)
Retic Ct Pct: 2.2 % (ref 0.4–3.1)

## 2021-10-17 LAB — MAGNESIUM: Magnesium: 1.6 mg/dL — ABNORMAL LOW (ref 1.7–2.4)

## 2021-10-17 LAB — PHOSPHORUS: Phosphorus: 2.4 mg/dL — ABNORMAL LOW (ref 2.5–4.6)

## 2021-10-17 LAB — FERRITIN: Ferritin: 598 ng/mL — ABNORMAL HIGH (ref 24–336)

## 2021-10-17 LAB — FOLATE: Folate: 16.8 ng/mL (ref >5.9)

## 2021-10-17 MED ORDER — POTASSIUM PHOSPHATES 15 MMOLE/5ML IV SOLN
30.0000 mmol | Freq: Once | INTRAVENOUS | Status: AC
  Administered 2021-10-17: 30 mmol via INTRAVENOUS
  Filled 2021-10-17: qty 10

## 2021-10-17 MED ORDER — MAGNESIUM SULFATE 2 GM/50ML IV SOLN
2.0000 g | Freq: Once | INTRAVENOUS | Status: AC
  Administered 2021-10-17: 2 g via INTRAVENOUS
  Filled 2021-10-17: qty 50

## 2021-10-17 NOTE — Evaluation (Signed)
Physical Therapy Evaluation Patient Details Name: Billy Anderson MRN: [id number] DOB: Dec 22, 1965 Today's Date: 10/17/2021  History of Present Illness  Pt is a 56 yr old male who was brought to the hospital with reports of weakness for 2 weeks and diarrhea for 3 days. He was found to have thrombocytopenia and hyponatremia. PMH: HTN, ETOH, L hand surgery, c.diff.  MRI L spine: No acute fracture or suspicious osseous lesion. Chronic  appearing compression deformity of L5, with approximately 75%  vertebral body height loss centrally and 3 mm retropulsion of the  posterosuperior cortex of L5  Clinical Impression  Pt admitted with above diagnosis.  Pt currently with functional limitations due to the deficits listed below (see PT Problem List). Pt will benefit from skilled PT to increase their independence and safety with mobility to allow discharge to the venue listed below.  Pt assisted with standing and taking a few steps over to recliner.  Pt reports poor mobility since rotator cuff injury and admission a few months ago.  Pt also reports difficulty getting onto/off lower surfaces like his couch.  Pt agreeable to f/u PT upon d/c.  Recommend AIR.  If pt does not qualify, would recommend SNF however pt seems hesitant to d/c to this setting since he has been there before.        Recommendations for follow up therapy are one component of a multi-disciplinary discharge planning process, led by the attending physician.  Recommendations may be updated based on patient status, additional functional criteria and insurance authorization.  Follow Up Recommendations Acute inpatient rehab (3hours/day)      Assistance Recommended at Discharge Intermittent Supervision/Assistance  Patient can return home with the following  A lot of help with walking and/or transfers;Help with stairs or ramp for entrance    Equipment Recommendations None recommended by PT  Recommendations for Other Services       Functional  Status Assessment Patient has had a recent decline in their functional status and demonstrates the ability to make significant improvements in function in a reasonable and predictable amount of time.     Precautions / Restrictions Precautions Precautions: Fall;Back Precaution Comments: back for comfort Restrictions Weight Bearing Restrictions: No Other Position/Activity Restrictions: contact/enteric      Mobility  Bed Mobility Overal bed mobility: Needs Assistance Bed Mobility: Rolling, Sidelying to Sit Rolling: Supervision Sidelying to sit: Supervision       General bed mobility comments: cues for log roll technique for back comfort, pt utilized bed rail    Transfers Overall transfer level: Needs assistance Equipment used: Rolling walker (2 wheels) Transfers: Sit to/from Stand, Bed to chair/wheelchair/BSC Sit to Stand: Mod assist, +2 physical assistance, From elevated surface   Step pivot transfers: Min assist, +2 safety/equipment       General transfer comment: cues for technique, assist for rise and steady; pt able to take a few steps forward while turning however reports poor performance with stepping backwards so recliner brought up to pt for safety; pt mildly tremulous throughout mobility    Ambulation/Gait                  Stairs            Wheelchair Mobility    Modified Rankin (Stroke Patients Only)       Balance           Standing balance support: Bilateral upper extremity supported, Reliant on assistive device for balance Standing balance-Leahy Scale: Poor  Pertinent Vitals/Pain Pain Assessment Pain Assessment: No/denies pain    Home Living Family/patient expects to be discharged to:: Private residence Living Arrangements: Spouse/significant other   Type of Home: House Home Access: Stairs to enter Entrance Stairs-Rails: Chemical engineer of Steps: 5   Home Layout:  One level Home Equipment: Merchant navy officer (4 wheels);Shower seat      Prior Function Prior Level of Function : Independent/Modified Independent             Mobility Comments: ambulated using either cane or rollator, but mostly rollator ADLs Comments: He performed ADLs without the need for assistance, however his significant other managed most of the household chores. He does not drive.     Hand Dominance   Dominant Hand: Right    Extremity/Trunk Assessment   Upper Extremity Assessment Upper Extremity Assessment:  (Significant chronic R shoulder AROM limitations, otherwise B UE AROM was [facility]. B UE grip strength 4/5)    Lower Extremity Assessment Lower Extremity Assessment: Generalized weakness (no focal weakness with MMT, generally 3+ to 4-/5 bilaterally)    Cervical / Trunk Assessment Cervical / Trunk Assessment: Normal  Communication   Communication: No difficulties  Cognition Arousal/Alertness: Awake/alert Behavior During Therapy: WFL for tasks assessed/performed Overall Cognitive Status: Within Functional Limits for tasks assessed                                 General Comments: Able to follow commands without difficulty, pleasant and motivated to participate in the session        General Comments      Exercises     Assessment/Plan    PT Assessment Patient needs continued PT services  PT Problem List Decreased strength;Decreased activity tolerance;Decreased balance;Decreased mobility;Decreased knowledge of use of DME       PT Treatment Interventions Gait training;DME instruction;Therapeutic exercise;Balance training;Functional mobility training;Therapeutic activities;Patient/family education    PT Goals (Current goals can be found in the Care Plan section)  Acute Rehab PT Goals PT Goal Formulation: With patient Time For Goal Achievement: 10/31/21 Potential to Achieve Goals: Good    Frequency Min 3X/week     Co-evaluation  PT/OT/SLP Co-Evaluation/Treatment: Yes Reason for Co-Treatment: To address functional/ADL transfers PT goals addressed during session: Mobility/safety with mobility         AM-PAC PT "6 Clicks" Mobility  Outcome Measure Help needed turning from your back to your side while in a flat bed without using bedrails?: A Little Help needed moving from lying on your back to sitting on the side of a flat bed without using bedrails?: A Little Help needed moving to and from a bed to a chair (including a wheelchair)?: A Lot Help needed standing up from a chair using your arms (e.g., wheelchair or bedside chair)?: A Lot Help needed to walk in hospital room?: A Lot Help needed climbing 3-5 steps with a railing? : Total 6 Click Score: 13    End of Session Equipment Utilized During Treatment: Gait belt Activity Tolerance: Patient tolerated treatment well Patient left: in chair;with call bell/phone within reach;with chair alarm set Nurse Communication: Mobility status PT Visit Diagnosis: Other abnormalities of gait and mobility (R26.89);Unsteadiness on feet (R26.81)    Time: [id number] PT Time Calculation (min) (ACUTE ONLY): 22 min   Charges:   PT Evaluation $PT Eval Low Complexity: 1 Low        Kati PT, DPT Physical Therapist Acute Rehabilitation Services Preferred contact  method: Secure Chat Weekend Pager Only: [phone] Office: [place] 10/17/2021, 3:33 PM
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
## 2021-10-17 NOTE — Progress Notes (Signed)
  Inpatient Rehab Admissions Coordinator :  Per therapy recommendations, patient was screened for CIR candidacy by Danne Baxter RN MSN. May not meet medical neccesity for hospital based rehab program, but an Admissions Coordinator will consult for full assessment. I will place a rehab consult per protocol. Please call me with any questions.  Danne Baxter RN MSN Admissions Coordinator [phone]
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
## 2021-10-17 NOTE — Plan of Care (Signed)
  Problem: Education: Goal: Knowledge of General Education information will improve Description: Including pain rating scale, medication(s)/side effects and non-pharmacologic comfort measures Outcome: Not Progressing   Problem: Health Behavior/Discharge Planning: Goal: Ability to manage health-related needs will improve Outcome: Not Progressing   Problem: Clinical Measurements: Goal: Ability to maintain clinical measurements within normal limits will improve Outcome: Progressing Goal: Will remain free from infection Outcome: Progressing Goal: Diagnostic test results will improve Outcome: Progressing Goal: Respiratory complications will improve Outcome: Progressing Goal: Cardiovascular complication will be avoided Outcome: Progressing   Problem: Activity: Goal: Risk for activity intolerance will decrease Outcome: Not Progressing   Problem: Nutrition: Goal: Adequate nutrition will be maintained Outcome: Progressing   Problem: Coping: Goal: Level of anxiety will decrease Outcome: Progressing   Problem: Elimination: Goal: Will not experience complications related to bowel motility Outcome: Progressing Goal: Will not experience complications related to urinary retention Outcome: Progressing   Problem: Pain Managment: Goal: General experience of comfort will improve Outcome: Progressing   Problem: Safety: Goal: Ability to remain free from injury will improve Outcome: Progressing   Problem: Skin Integrity: Goal: Risk for impaired skin integrity will decrease Outcome: Progressing   
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
## 2021-10-18 DIAGNOSIS — F1021 Alcohol dependence, in remission: Secondary | ICD-10-CM | POA: Diagnosis not present

## 2021-10-18 DIAGNOSIS — D696 Thrombocytopenia, unspecified: Secondary | ICD-10-CM | POA: Diagnosis not present

## 2021-10-18 DIAGNOSIS — R262 Difficulty in walking, not elsewhere classified: Secondary | ICD-10-CM | POA: Diagnosis not present

## 2021-10-18 DIAGNOSIS — I10 Essential (primary) hypertension: Secondary | ICD-10-CM | POA: Diagnosis not present

## 2021-10-18 LAB — PROTIME-INR
INR: 1 (ref 0.8–1.2)
Prothrombin Time: 12.7 seconds (ref 11.4–15.2)

## 2021-10-18 LAB — COMPREHENSIVE METABOLIC PANEL
ALT: 67 U/L — ABNORMAL HIGH (ref 0–44)
AST: 103 U/L — ABNORMAL HIGH (ref 15–41)
Albumin: 3 g/dL — ABNORMAL LOW (ref 3.5–5.0)
Alkaline Phosphatase: 127 U/L — ABNORMAL HIGH (ref 38–126)
Anion gap: 10 (ref 5–15)
BUN: 9 mg/dL (ref 6–20)
CO2: 25 mmol/L (ref 22–32)
Calcium: 8.1 mg/dL — ABNORMAL LOW (ref 8.9–10.3)
Chloride: 99 mmol/L (ref 98–111)
Creatinine, Ser: 0.48 mg/dL — ABNORMAL LOW (ref 0.61–1.24)
GFR, Estimated: >60 mL/min (ref >60)
Glucose, Bld: 107 mg/dL — ABNORMAL HIGH (ref 70–99)
Potassium: 3.6 mmol/L (ref 3.5–5.1)
Sodium: 134 mmol/L — ABNORMAL LOW (ref 135–145)
Total Bilirubin: 0.9 mg/dL (ref 0.3–1.2)
Total Protein: 6.2 g/dL — ABNORMAL LOW (ref 6.5–8.1)

## 2021-10-18 LAB — CBC
HCT: 35.1 % — ABNORMAL LOW (ref 39.0–52.0)
Hemoglobin: 12.4 g/dL — ABNORMAL LOW (ref 13.0–17.0)
MCH: 38.5 pg — ABNORMAL HIGH (ref 26.0–34.0)
MCHC: 35.3 g/dL (ref 30.0–36.0)
MCV: 109 fL — ABNORMAL HIGH (ref 80.0–100.0)
Platelets: 119 10*3/uL — ABNORMAL LOW (ref 150–400)
RBC: 3.22 MIL/uL — ABNORMAL LOW (ref 4.22–5.81)
RDW: 14.3 % (ref 11.5–15.5)
WBC: 4.5 10*3/uL (ref 4.0–10.5)
nRBC: 0 % (ref 0.0–0.2)

## 2021-10-18 LAB — MAGNESIUM: Magnesium: 1.8 mg/dL (ref 1.7–2.4)

## 2021-10-18 LAB — PHOSPHORUS: Phosphorus: 3.3 mg/dL (ref 2.5–4.6)

## 2021-10-18 LAB — AMMONIA: Ammonia: 32 umol/L (ref 9–35)

## 2021-10-18 MED ORDER — ENSURE ENLIVE PO LIQD
237.0000 mL | Freq: Two times a day (BID) | ORAL | Status: DC
  Administered 2021-10-20 – 2021-10-22 (×4): 237 mL via ORAL

## 2021-10-18 MED ORDER — PANTOPRAZOLE SODIUM 40 MG PO TBEC
40.0000 mg | DELAYED_RELEASE_TABLET | Freq: Every day | ORAL | Status: DC
  Administered 2021-10-18 – 2021-10-22 (×5): 40 mg via ORAL
  Filled 2021-10-18 (×5): qty 1

## 2021-10-18 NOTE — Progress Notes (Signed)
[facility] IP rehab admissions - I spoke with patient's wife by phone.  Patient has been homebound and not getting up much at home recently.  Wife says he was at [facility] for rehab in May/June for about 30 days.  Wife is struggling with patient at home and is only 5'1" tall.  Wife would like inpatient rehab and then is hopeful for a home disharge after rehab if patient can make some progress.  I will have my partners follow up on Monday for plans.  We will need to discuss with rehab MD to see if patient meets criteria for CIR admission.  Call me for questions.  [phone]
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
## 2021-10-19 DIAGNOSIS — I10 Essential (primary) hypertension: Secondary | ICD-10-CM | POA: Diagnosis not present

## 2021-10-19 DIAGNOSIS — D696 Thrombocytopenia, unspecified: Secondary | ICD-10-CM | POA: Diagnosis not present

## 2021-10-19 DIAGNOSIS — F1021 Alcohol dependence, in remission: Secondary | ICD-10-CM | POA: Diagnosis not present

## 2021-10-19 DIAGNOSIS — R262 Difficulty in walking, not elsewhere classified: Secondary | ICD-10-CM | POA: Diagnosis not present

## 2021-10-19 LAB — MAGNESIUM: Magnesium: 1.6 mg/dL — ABNORMAL LOW (ref 1.7–2.4)

## 2021-10-19 LAB — COMPREHENSIVE METABOLIC PANEL
ALT: 53 U/L — ABNORMAL HIGH (ref 0–44)
AST: 55 U/L — ABNORMAL HIGH (ref 15–41)
Albumin: 3 g/dL — ABNORMAL LOW (ref 3.5–5.0)
Alkaline Phosphatase: 108 U/L (ref 38–126)
Anion gap: 8 (ref 5–15)
BUN: 11 mg/dL (ref 6–20)
CO2: 23 mmol/L (ref 22–32)
Calcium: 8.4 mg/dL — ABNORMAL LOW (ref 8.9–10.3)
Chloride: 104 mmol/L (ref 98–111)
Creatinine, Ser: 0.64 mg/dL (ref 0.61–1.24)
GFR, Estimated: >60 mL/min (ref >60)
Glucose, Bld: 137 mg/dL — ABNORMAL HIGH (ref 70–99)
Potassium: 3.6 mmol/L (ref 3.5–5.1)
Sodium: 135 mmol/L (ref 135–145)
Total Bilirubin: 1 mg/dL (ref 0.3–1.2)
Total Protein: 6 g/dL — ABNORMAL LOW (ref 6.5–8.1)

## 2021-10-19 LAB — CBC
HCT: 36 % — ABNORMAL LOW (ref 39.0–52.0)
Hemoglobin: 12.5 g/dL — ABNORMAL LOW (ref 13.0–17.0)
MCH: 38.8 pg — ABNORMAL HIGH (ref 26.0–34.0)
MCHC: 34.7 g/dL (ref 30.0–36.0)
MCV: 111.8 fL — ABNORMAL HIGH (ref 80.0–100.0)
Platelets: 135 10*3/uL — ABNORMAL LOW (ref 150–400)
RBC: 3.22 MIL/uL — ABNORMAL LOW (ref 4.22–5.81)
RDW: 14.4 % (ref 11.5–15.5)
WBC: 4.7 10*3/uL (ref 4.0–10.5)
nRBC: 0 % (ref 0.0–0.2)

## 2021-10-19 LAB — PHOSPHORUS: Phosphorus: 3.6 mg/dL (ref 2.5–4.6)

## 2021-10-19 LAB — CK: Total CK: 31 U/L — ABNORMAL LOW (ref 49–397)

## 2021-10-19 MED ORDER — DILTIAZEM HCL ER COATED BEADS 180 MG PO CP24
180.0000 mg | ORAL_CAPSULE | Freq: Every day | ORAL | Status: DC
  Administered 2021-10-19 – 2021-10-20 (×2): 180 mg via ORAL
  Filled 2021-10-19 (×2): qty 1

## 2021-10-19 MED ORDER — MAGNESIUM SULFATE 2 GM/50ML IV SOLN
2.0000 g | Freq: Once | INTRAVENOUS | Status: AC
  Administered 2021-10-19: 2 g via INTRAVENOUS
  Filled 2021-10-19: qty 50

## 2021-10-19 NOTE — Progress Notes (Signed)
Occupational Therapy Treatment Patient Details Name: Billy Anderson MRN: [id number] DOB: 03/10/1965 Today's Date: 10/19/2021   History of present illness Pt is a 56 yr old male who was brought to the hospital with reports of weakness for 2 weeks and diarrhea for 3 days. He was found to have thrombocytopenia and hyponatremia. PMH: HTN, ETOH, L hand surgery, c.diff.  MRI L spine: No acute fracture or suspicious osseous lesion. Chronic  appearing compression deformity of L5, with approximately 75%  vertebral body height loss centrally and 3 mm retropulsion of the  posterosuperior cortex of L5   OT comments  Pt agreeable to OOB.  Pts bed a mess.  Pt had not called for A. Educated on importance of calling and aksing for A   Recommendations for follow up therapy are one component of a multi-disciplinary discharge planning process, led by the attending physician.  Recommendations may be updated based on patient status, additional functional criteria and insurance authorization.    Follow Up Recommendations  Acute inpatient rehab (3hours/day)       Patient can return home with the following  A lot of help with bathing/dressing/bathroom;A lot of help with walking and/or transfers;Assistance with cooking/housework;Help with stairs or ramp for entrance;Assist for transportation         Precautions / Restrictions Precautions Precautions: Fall;Back Precaution Comments: back for comfort       Mobility Bed Mobility   Bed Mobility: Supine to Sit Rolling: Min assist              Transfers Overall transfer level: Needs assistance Equipment used: Rolling walker (2 wheels) Transfers: Sit to/from Stand, Bed to chair/wheelchair/BSC Sit to Stand: Mod assist, +2 physical assistance, From elevated surface     Step pivot transfers: +2 physical assistance, +2 safety/equipment, Mod assist           Balance           Standing balance support: Bilateral upper extremity supported, Reliant  on assistive device for balance Standing balance-Leahy Scale: Poor                             ADL either performed or assessed with clinical judgement   ADL Overall ADL's : Needs assistance/impaired                         Toilet Transfer: BSC/3in1;Stand-pivot;+2 for safety/equipment;+2 for physical assistance;Moderate assistance   Toileting- Clothing Manipulation and Hygiene: Maximal assistance;Sit to/from stand;+2 for safety/equipment;+2 for physical assistance       Functional mobility during ADLs: Moderate assistance;+2 for physical assistance;+2 for safety/equipment General ADL Comments: pt with trouble backing up to chair initially.  Backwards steps challening this day    Extremity/Trunk Assessment Upper Extremity Assessment Upper Extremity Assessment: Generalized weakness            Vision Patient Visual Report: No change from baseline            Cognition Arousal/Alertness: Awake/alert Behavior During Therapy: WFL for tasks assessed/performed Overall Cognitive Status: Within Functional Limits for tasks assessed                                            Frequency  Min 2X/week        Progress Toward Goals  OT Goals(current goals can now  be found in the care plan section)  Progress towards OT goals: Progressing toward goals     Plan Discharge plan remains appropriate       AM-PAC OT "6 Clicks" Daily Activity     Outcome Measure   Help from another person eating meals?: None Help from another person taking care of personal grooming?: None Help from another person toileting, which includes using toliet, bedpan, or urinal?: A Lot Help from another person bathing (including washing, rinsing, drying)?: A Lot Help from another person to put on and taking off regular upper body clothing?: A Little Help from another person to put on and taking off regular lower body clothing?: A Lot 6 Click Score: 17    End of  Session Equipment Utilized During Treatment: Gait belt;Rolling walker (2 wheels)  OT Visit Diagnosis: Unsteadiness on feet (R26.81);Muscle weakness (generalized) (M62.81)   Activity Tolerance Patient tolerated treatment well   Patient Left in chair;with call bell/phone within reach;with chair alarm set;with nursing/sitter in room   Nurse Communication Mobility status        Time: [id number] OT Time Calculation (min): 21 min  Charges: OT Treatments $Self Care/Home Management : 8-22 mins  Kari Baars, OT Acute Rehabilitation Services [phone] Office- [phone]     Appolonia Ackert, Edwena Felty D 10/19/2021, 3:53 PM
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
## 2021-10-19 NOTE — Progress Notes (Signed)
Occupational Therapy Treatment Patient Details Name: Billy Anderson MRN: [id number] DOB: 1965-07-12 Today's Date: 10/19/2021   History of present illness Pt is a 56 yr old male who was brought to the hospital with reports of weakness for 2 weeks and diarrhea for 3 days. He was found to have thrombocytopenia and hyponatremia. PMH: HTN, ETOH, L hand surgery, c.diff.  MRI L spine: No acute fracture or suspicious osseous lesion. Chronic  appearing compression deformity of L5, with approximately 75%  vertebral body height loss centrally and 3 mm retropulsion of the  posterosuperior cortex of L5   OT comments  Pt agreeable to OOB with OT.  Pt had not been up this day.  Needed plus 2 A to get to chair   Recommendations for follow up therapy are one component of a multi-disciplinary discharge planning process, led by the attending physician.  Recommendations may be updated based on patient status, additional functional criteria and insurance authorization.    Follow Up Recommendations  Acute inpatient rehab (3hours/day)    Assistance Recommended at Discharge    Patient can return home with the following  A lot of help with bathing/dressing/bathroom;A lot of help with walking and/or transfers;Assistance with cooking/housework;Help with stairs or ramp for entrance;Assist for transportation         Precautions / Restrictions Precautions Precautions: Fall;Back Precaution Comments: back for comfort       Mobility Bed Mobility   Bed Mobility: Supine to Sit Rolling: Min assist              Transfers Overall transfer level: Needs assistance Equipment used: Rolling walker (2 wheels) Transfers: Sit to/from Stand, Bed to chair/wheelchair/BSC Sit to Stand: Mod assist, +2 physical assistance, From elevated surface     Step pivot transfers: +2 physical assistance, +2 safety/equipment, Mod assist           Balance           Standing balance support: Bilateral upper extremity  supported, Reliant on assistive device for balance Standing balance-Leahy Scale: Poor                             ADL either performed or assessed with clinical judgement   ADL Overall ADL's : Needs assistance/impaired                         Toilet Transfer: BSC/3in1;Stand-pivot;+2 for safety/equipment;+2 for physical assistance;Moderate assistance   Toileting- Clothing Manipulation and Hygiene: Maximal assistance;Sit to/from stand;+2 for safety/equipment;+2 for physical assistance       Functional mobility during ADLs: Moderate assistance;+2 for physical assistance;+2 for safety/equipment General ADL Comments: pt with trouble backing up to chair initially.  Backwards steps challening this day    Extremity/Trunk Assessment Upper Extremity Assessment Upper Extremity Assessment: Generalized weakness            Vision Patient Visual Report: No change from baseline            Cognition Arousal/Alertness: Awake/alert Behavior During Therapy: WFL for tasks assessed/performed Overall Cognitive Status: Within Functional Limits for tasks assessed                                            Frequency  Min 2X/week        Progress Toward Goals  OT Goals(current goals  can now be found in the care plan section)  Progress towards OT goals: Progressing toward goals     Plan Discharge plan remains appropriate       AM-PAC OT "6 Clicks" Daily Activity     Outcome Measure   Help from another person eating meals?: None Help from another person taking care of personal grooming?: None Help from another person toileting, which includes using toliet, bedpan, or urinal?: A Lot Help from another person bathing (including washing, rinsing, drying)?: A Lot Help from another person to put on and taking off regular upper body clothing?: A Little Help from another person to put on and taking off regular lower body clothing?: A Lot 6 Click  Score: 17    End of Session Equipment Utilized During Treatment: Gait belt;Rolling walker (2 wheels)  OT Visit Diagnosis: Unsteadiness on feet (R26.81);Muscle weakness (generalized) (M62.81)   Activity Tolerance Patient tolerated treatment well   Patient Left in chair;with call bell/phone within reach;with chair alarm set;with nursing/sitter in room   Nurse Communication Mobility status        Time: 1448-1856 OT Time Calculation (min): 21 min  Charges: OT Treatments $Self Care/Home Management : 8-22 mins  Lise Auer, OT Acute Rehabilitation Services [phone] Office- [phone]     Enid Maultsby, Karin Golden D 10/19/2021, 3:56 PM

## 2021-10-20 DIAGNOSIS — F1021 Alcohol dependence, in remission: Secondary | ICD-10-CM | POA: Diagnosis not present

## 2021-10-20 DIAGNOSIS — I10 Essential (primary) hypertension: Secondary | ICD-10-CM | POA: Diagnosis not present

## 2021-10-20 DIAGNOSIS — D696 Thrombocytopenia, unspecified: Secondary | ICD-10-CM | POA: Diagnosis not present

## 2021-10-20 DIAGNOSIS — R262 Difficulty in walking, not elsewhere classified: Secondary | ICD-10-CM | POA: Diagnosis not present

## 2021-10-20 LAB — COMPREHENSIVE METABOLIC PANEL
ALT: 46 U/L — ABNORMAL HIGH (ref 0–44)
AST: 42 U/L — ABNORMAL HIGH (ref 15–41)
Albumin: 3.2 g/dL — ABNORMAL LOW (ref 3.5–5.0)
Alkaline Phosphatase: 111 U/L (ref 38–126)
Anion gap: 5 (ref 5–15)
BUN: 12 mg/dL (ref 6–20)
CO2: 26 mmol/L (ref 22–32)
Calcium: 8.3 mg/dL — ABNORMAL LOW (ref 8.9–10.3)
Chloride: 103 mmol/L (ref 98–111)
Creatinine, Ser: 0.56 mg/dL — ABNORMAL LOW (ref 0.61–1.24)
GFR, Estimated: >60 mL/min (ref >60)
Glucose, Bld: 109 mg/dL — ABNORMAL HIGH (ref 70–99)
Potassium: 4.1 mmol/L (ref 3.5–5.1)
Sodium: 134 mmol/L — ABNORMAL LOW (ref 135–145)
Total Bilirubin: 0.9 mg/dL (ref 0.3–1.2)
Total Protein: 6.5 g/dL (ref 6.5–8.1)

## 2021-10-20 LAB — CBC
HCT: 35.5 % — ABNORMAL LOW (ref 39.0–52.0)
Hemoglobin: 12.5 g/dL — ABNORMAL LOW (ref 13.0–17.0)
MCH: 39.3 pg — ABNORMAL HIGH (ref 26.0–34.0)
MCHC: 35.2 g/dL (ref 30.0–36.0)
MCV: 111.6 fL — ABNORMAL HIGH (ref 80.0–100.0)
Platelets: 142 10*3/uL — ABNORMAL LOW (ref 150–400)
RBC: 3.18 MIL/uL — ABNORMAL LOW (ref 4.22–5.81)
RDW: 14.6 % (ref 11.5–15.5)
WBC: 4.6 10*3/uL (ref 4.0–10.5)
nRBC: 0 % (ref 0.0–0.2)

## 2021-10-20 LAB — GASTROINTESTINAL PANEL BY PCR, STOOL (REPLACES STOOL CULTURE)

## 2021-10-20 LAB — PHOSPHORUS: Phosphorus: 3.4 mg/dL (ref 2.5–4.6)

## 2021-10-20 LAB — MAGNESIUM: Magnesium: 1.9 mg/dL (ref 1.7–2.4)

## 2021-10-20 MED ORDER — SENNOSIDES-DOCUSATE SODIUM 8.6-50 MG PO TABS
1.0000 | ORAL_TABLET | Freq: Two times a day (BID) | ORAL | Status: DC | PRN
  Administered 2021-10-21: 1 via ORAL
  Filled 2021-10-20: qty 1

## 2021-10-20 MED ORDER — DILTIAZEM HCL ER COATED BEADS 180 MG PO CP24
360.0000 mg | ORAL_CAPSULE | Freq: Every day | ORAL | Status: DC
  Administered 2021-10-21 – 2021-10-22 (×2): 360 mg via ORAL
  Filled 2021-10-20 (×2): qty 2

## 2021-10-20 MED ORDER — POLYETHYLENE GLYCOL 3350 17 G PO PACK
17.0000 g | PACK | Freq: Two times a day (BID) | ORAL | Status: DC | PRN
  Administered 2021-10-21: 17 g via ORAL
  Filled 2021-10-20: qty 1

## 2021-10-20 NOTE — Progress Notes (Signed)
Physical Therapy Treatment Patient Details Name: Sparrow Uhlenhake III MRN: [id number] DOB: 06/04/1965 Today's Date: 10/20/2021   History of Present Illness Pt is a 56 yr old male who was brought to the hospital with reports of weakness for 2 weeks and diarrhea for 3 days. He was found to have thrombocytopenia and hyponatremia. PMH: HTN, ETOH, L hand surgery, c.diff.  MRI L spine: No acute fracture or suspicious osseous lesion. Chronic  appearing compression deformity of L5, with approximately 75%  vertebral body height loss centrally and 3 mm retropulsion of the  posterosuperior cortex of L5    PT Comments    Pt is slowly progressing toward acute PT goals this session with progression to ambulation Pt ambulated ~31ft, 2ft with MIN A and use of RW, seated rest break between. Pt still demonstrates decreased activity tolerance and generalized weakness requiring MOD A +2 assist for power up to stand. Pt will benefit from continued skilled PT to increase their independence and maximize safety with mobility.     Recommendations for follow up therapy are one component of a multi-disciplinary discharge planning process, led by the attending physician.  Recommendations may be updated based on patient status, additional functional criteria and insurance authorization.  Follow Up Recommendations  Acute inpatient rehab (3hours/day)     Assistance Recommended at Discharge Intermittent Supervision/Assistance  Patient can return home with the following A lot of help with walking and/or transfers;Help with stairs or ramp for entrance   Equipment Recommendations  None recommended by PT    Recommendations for Other Services       Precautions / Restrictions Precautions Precautions: Fall;Back Precaution Comments: back for comfort Restrictions Weight Bearing Restrictions: No     Mobility  Bed Mobility Overal bed mobility: Needs Assistance Bed Mobility: Supine to Sit     Supine to sit: Min guard      General bed mobility comments: increased time    Transfers Overall transfer level: Needs assistance Equipment used: Rolling walker (2 wheels) Transfers: Sit to/from Stand Sit to Stand: Mod assist, +2 physical assistance, +2 safety/equipment           General transfer comment: STS x3; MOD A+2 for power up to rise with cues for hand placement. Decreased asisst required MOD +1 to stand from recliner when using firm armrest    Ambulation/Gait Ambulation/Gait assistance: Min assist Gait Distance (Feet): 20 Feet (additional 60ft following seated rest) Assistive device: Rolling walker (2 wheels) Gait Pattern/deviations: Step-through pattern, Decreased stride length Gait velocity: decreased     General Gait Details: additional 60ft following seated rest break. Increased UE muscle fatigue noted with tremoring, pt reports tremors at baseline but currently more intense suspect due to weakness. increased intensity with increasing distance. Slight forward flexion, cues to maintain proximity to RW.   Stairs             Wheelchair Mobility    Modified Rankin (Stroke Patients Only)       Balance Overall balance assessment: Needs assistance Sitting-balance support: Feet supported Sitting balance-Leahy Scale: Good     Standing balance support: Bilateral upper extremity supported Standing balance-Leahy Scale: Poor                              Cognition Arousal/Alertness: Awake/alert Behavior During Therapy: WFL for tasks assessed/performed Overall Cognitive Status: Within Functional Limits for tasks assessed  Exercises      General Comments        Pertinent Vitals/Pain Pain Assessment Pain Assessment: No/denies pain    Home Living                          Prior Function            PT Goals (current goals can now be found in the care plan section) Acute Rehab PT Goals Patient  Stated Goal: want to be able to stand up from a chair on my own PT Goal Formulation: With patient Time For Goal Achievement: 10/31/21 Potential to Achieve Goals: Good Progress towards PT goals: Progressing toward goals    Frequency    Min 3X/week      PT Plan Current plan remains appropriate    Co-evaluation              AM-PAC PT "6 Clicks" Mobility   Outcome Measure  Help needed turning from your back to your side while in a flat bed without using bedrails?: A Little Help needed moving from lying on your back to sitting on the side of a flat bed without using bedrails?: A Little Help needed moving to and from a bed to a chair (including a wheelchair)?: A Lot Help needed standing up from a chair using your arms (e.g., wheelchair or bedside chair)?: Total Help needed to walk in hospital room?: A Little Help needed climbing 3-5 steps with a railing? : Total 6 Click Score: 13    End of Session Equipment Utilized During Treatment: Gait belt Activity Tolerance: Patient tolerated treatment well Patient left: in chair;with call bell/phone within reach;with chair alarm set Nurse Communication: Mobility status PT Visit Diagnosis: Other abnormalities of gait and mobility (R26.89);Unsteadiness on feet (R26.81)     Time: 1884-1660 PT Time Calculation (min) (ACUTE ONLY): 21 min  Charges:  $Therapeutic Activity: 8-22 mins                     Festus Barren PT, DPT  Acute Rehabilitation Services  Office [phone]   10/20/2021, 1:13 PM
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
## 2021-10-20 NOTE — Progress Notes (Signed)
Inpatient Rehab Admissions Coordinator:  Discussed medical necessity with Dr. Naaman Plummer. Pt does meet medical necessity to warrant a possible CIR admission. There are no beds available in CIR for pt today. Also await medical clearance.  Will continue to follow.   Gayland Curry, [place], [place] Admissions Coordinator [phone]

## 2021-10-21 DIAGNOSIS — F1021 Alcohol dependence, in remission: Secondary | ICD-10-CM | POA: Diagnosis not present

## 2021-10-21 DIAGNOSIS — D696 Thrombocytopenia, unspecified: Secondary | ICD-10-CM | POA: Diagnosis not present

## 2021-10-21 DIAGNOSIS — R262 Difficulty in walking, not elsewhere classified: Secondary | ICD-10-CM | POA: Diagnosis not present

## 2021-10-21 DIAGNOSIS — I10 Essential (primary) hypertension: Secondary | ICD-10-CM | POA: Diagnosis not present
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
## 2021-10-21 NOTE — Progress Notes (Signed)
Inpatient Rehab Admissions Coordinator:    I do not have a CIR bed for this pt. Today. Will follow up for potential admit tomorrow pending bed availability.  Clemens Catholic, [place], [place] Admissions Coordinator  [phone] ([place]) [phone] (office)

## 2021-10-22 ENCOUNTER — Inpatient Hospital Stay (HOSPITAL_COMMUNITY)
Admission: RE | Admit: 2021-10-22 | Discharge: 2021-11-01 | DRG: 552 | Disposition: A | Discharge status: Home Health | Payer: Medicaid Other | Source: Other Acute Inpatient Hospital | Attending: Physical Medicine & Rehabilitation | Admitting: Physical Medicine & Rehabilitation

## 2021-10-22 ENCOUNTER — Encounter (HOSPITAL_COMMUNITY): Payer: Self-pay | Admitting: Physical Medicine & Rehabilitation

## 2021-10-22 ENCOUNTER — Other Ambulatory Visit: Payer: Self-pay

## 2021-10-22 DIAGNOSIS — R197 Diarrhea, unspecified: Secondary | ICD-10-CM

## 2021-10-22 DIAGNOSIS — D696 Thrombocytopenia, unspecified: Secondary | ICD-10-CM | POA: Diagnosis not present

## 2021-10-22 DIAGNOSIS — F319 Bipolar disorder, unspecified: Secondary | ICD-10-CM | POA: Diagnosis present

## 2021-10-22 DIAGNOSIS — I10 Essential (primary) hypertension: Secondary | ICD-10-CM | POA: Diagnosis present

## 2021-10-22 DIAGNOSIS — K922 Gastrointestinal hemorrhage, unspecified: Secondary | ICD-10-CM

## 2021-10-22 DIAGNOSIS — I480 Paroxysmal atrial fibrillation: Secondary | ICD-10-CM | POA: Diagnosis present

## 2021-10-22 DIAGNOSIS — G40909 Epilepsy, unspecified, not intractable, without status epilepticus: Secondary | ICD-10-CM | POA: Diagnosis present

## 2021-10-22 DIAGNOSIS — K219 Gastro-esophageal reflux disease without esophagitis: Secondary | ICD-10-CM | POA: Diagnosis present

## 2021-10-22 DIAGNOSIS — M48061 Spinal stenosis, lumbar region without neurogenic claudication: Secondary | ICD-10-CM | POA: Diagnosis present

## 2021-10-22 DIAGNOSIS — G8929 Other chronic pain: Secondary | ICD-10-CM | POA: Diagnosis present

## 2021-10-22 DIAGNOSIS — E871 Hypo-osmolality and hyponatremia: Secondary | ICD-10-CM | POA: Diagnosis present

## 2021-10-22 DIAGNOSIS — R29898 Other symptoms and signs involving the musculoskeletal system: Secondary | ICD-10-CM

## 2021-10-22 DIAGNOSIS — R195 Other fecal abnormalities: Secondary | ICD-10-CM

## 2021-10-22 DIAGNOSIS — L89322 Pressure ulcer of left buttock, stage 2: Secondary | ICD-10-CM | POA: Diagnosis present

## 2021-10-22 DIAGNOSIS — L8990 Pressure ulcer of unspecified site, unspecified stage: Secondary | ICD-10-CM | POA: Insufficient documentation

## 2021-10-22 DIAGNOSIS — R7401 Elevation of levels of liver transaminase levels: Secondary | ICD-10-CM

## 2021-10-22 DIAGNOSIS — M4856XD Collapsed vertebra, not elsewhere classified, lumbar region, subsequent encounter for fracture with routine healing: Secondary | ICD-10-CM | POA: Diagnosis present

## 2021-10-22 DIAGNOSIS — Z888 Allergy status to other drugs, medicaments and biological substances status: Secondary | ICD-10-CM

## 2021-10-22 DIAGNOSIS — S32050S Wedge compression fracture of fifth lumbar vertebra, sequela: Secondary | ICD-10-CM

## 2021-10-22 DIAGNOSIS — Z7982 Long term (current) use of aspirin: Secondary | ICD-10-CM | POA: Diagnosis not present

## 2021-10-22 DIAGNOSIS — Z885 Allergy status to narcotic agent status: Secondary | ICD-10-CM | POA: Diagnosis not present

## 2021-10-22 DIAGNOSIS — R262 Difficulty in walking, not elsewhere classified: Secondary | ICD-10-CM

## 2021-10-22 DIAGNOSIS — Z79899 Other long term (current) drug therapy: Secondary | ICD-10-CM | POA: Diagnosis not present

## 2021-10-22 DIAGNOSIS — R790 Abnormal level of blood mineral: Secondary | ICD-10-CM

## 2021-10-22 DIAGNOSIS — L89312 Pressure ulcer of right buttock, stage 2: Secondary | ICD-10-CM | POA: Diagnosis present

## 2021-10-22 DIAGNOSIS — D6959 Other secondary thrombocytopenia: Secondary | ICD-10-CM | POA: Diagnosis present

## 2021-10-22 DIAGNOSIS — F3110 Bipolar disorder, current episode manic without psychotic features, unspecified: Secondary | ICD-10-CM | POA: Diagnosis not present

## 2021-10-22 DIAGNOSIS — F17210 Nicotine dependence, cigarettes, uncomplicated: Secondary | ICD-10-CM | POA: Diagnosis present

## 2021-10-22 DIAGNOSIS — M48062 Spinal stenosis, lumbar region with neurogenic claudication: Secondary | ICD-10-CM | POA: Diagnosis not present

## 2021-10-22 DIAGNOSIS — L4050 Arthropathic psoriasis, unspecified: Secondary | ICD-10-CM | POA: Diagnosis present

## 2021-10-22 DIAGNOSIS — M545 Low back pain, unspecified: Secondary | ICD-10-CM | POA: Diagnosis not present

## 2021-10-22 MED ORDER — POLYETHYLENE GLYCOL 3350 17 G PO PACK: 17.0000 g | PACK | Freq: Two times a day (BID) | ORAL | 0 refills | Status: DC | PRN

## 2021-10-22 MED ORDER — PANTOPRAZOLE SODIUM 40 MG PO TBEC: 40.0000 mg | DELAYED_RELEASE_TABLET | Freq: Every day | ORAL | Status: DC

## 2021-10-22 MED ORDER — SENNOSIDES-DOCUSATE SODIUM 8.6-50 MG PO TABS
1.0000 | ORAL_TABLET | Freq: Two times a day (BID) | ORAL | Status: DC | PRN
  Administered 2021-10-23 – 2021-11-01 (×11): 1 via ORAL
  Filled 2021-10-22 (×12): qty 1

## 2021-10-22 MED ORDER — NICOTINE 21 MG/24HR TD PT24: 21.0000 mg | MEDICATED_PATCH | Freq: Every day | TRANSDERMAL | 0 refills | Status: DC

## 2021-10-22 MED ORDER — SENNOSIDES-DOCUSATE SODIUM 8.6-50 MG PO TABS: 1.0000 | ORAL_TABLET | Freq: Two times a day (BID) | ORAL | Status: DC | PRN

## 2021-10-22 MED ORDER — VITAMIN B-12 1000 MCG PO TABS: 1000.0000 ug | ORAL_TABLET | Freq: Every day | ORAL | Status: DC

## 2021-10-22 MED ORDER — ONDANSETRON HCL 4 MG PO TABS: 4.0000 mg | ORAL_TABLET | Freq: Four times a day (QID) | ORAL | Status: DC | PRN

## 2021-10-22 MED ORDER — FOLIC ACID 1 MG PO TABS: 1.0000 mg | ORAL_TABLET | Freq: Every day | ORAL | Status: DC

## 2021-10-22 MED ORDER — ADULT MULTIVITAMIN W/MINERALS CH
1.0000 | ORAL_TABLET | Freq: Every day | ORAL | Status: DC
  Administered 2021-10-22 – 2021-11-01 (×11): 1 via ORAL
  Filled 2021-10-22 (×14): qty 1

## 2021-10-22 MED ORDER — MAGNESIUM OXIDE -MG SUPPLEMENT 400 (240 MG) MG PO TABS
800.0000 mg | ORAL_TABLET | Freq: Two times a day (BID) | ORAL | Status: DC
  Administered 2021-10-22 – 2021-11-01 (×21): 800 mg via ORAL
  Filled 2021-10-22 (×21): qty 2

## 2021-10-22 MED ORDER — GABAPENTIN 300 MG PO CAPS
300.0000 mg | ORAL_CAPSULE | Freq: Two times a day (BID) | ORAL | Status: DC
  Administered 2021-10-22 – 2021-11-01 (×20): 300 mg via ORAL
  Filled 2021-10-22 (×20): qty 1

## 2021-10-22 MED ORDER — OLANZAPINE 10 MG PO TABS
20.0000 mg | ORAL_TABLET | Freq: Every day | ORAL | Status: DC
  Administered 2021-10-22 – 2021-10-31 (×10): 20 mg via ORAL
  Filled 2021-10-22 (×11): qty 2

## 2021-10-22 MED ORDER — HYDROCERIN EX CREA
TOPICAL_CREAM | Freq: Two times a day (BID) | CUTANEOUS | Status: DC
  Filled 2021-10-22: qty 113

## 2021-10-22 MED ORDER — CYANOCOBALAMIN 1000 MCG/ML IJ SOLN
1000.0000 ug | Freq: Every day | INTRAMUSCULAR | Status: AC
  Administered 2021-10-23: 1000 ug via SUBCUTANEOUS
  Filled 2021-10-22: qty 1

## 2021-10-22 MED ORDER — PANTOPRAZOLE SODIUM 40 MG PO TBEC
40.0000 mg | DELAYED_RELEASE_TABLET | Freq: Every day | ORAL | Status: DC
  Administered 2021-10-23 – 2021-11-01 (×10): 40 mg via ORAL
  Filled 2021-10-22 (×10): qty 1

## 2021-10-22 MED ORDER — MELATONIN 5 MG PO TABS: 10.0000 mg | ORAL_TABLET | Freq: Every evening | ORAL | Status: DC | PRN

## 2021-10-22 MED ORDER — THIAMINE MONONITRATE 100 MG PO TABS
100.0000 mg | ORAL_TABLET | Freq: Every day | ORAL | Status: DC
  Administered 2021-10-22 – 2021-11-01 (×11): 100 mg via ORAL
  Filled 2021-10-22 (×11): qty 1

## 2021-10-22 MED ORDER — SODIUM CHLORIDE 1 G PO TABS
1.0000 g | ORAL_TABLET | Freq: Three times a day (TID) | ORAL | Status: DC
  Administered 2021-10-22 – 2021-10-27 (×14): 1 g via ORAL
  Filled 2021-10-22 (×15): qty 1

## 2021-10-22 MED ORDER — FOLIC ACID 1 MG PO TABS
1.0000 mg | ORAL_TABLET | Freq: Every day | ORAL | Status: DC
  Administered 2021-10-22 – 2021-11-01 (×11): 1 mg via ORAL
  Filled 2021-10-22 (×11): qty 1

## 2021-10-22 MED ORDER — ENSURE ENLIVE PO LIQD: 237.0000 mL | Freq: Two times a day (BID) | ORAL | 12 refills | Status: DC

## 2021-10-22 MED ORDER — DILTIAZEM HCL ER COATED BEADS 180 MG PO CP24
360.0000 mg | ORAL_CAPSULE | Freq: Every day | ORAL | Status: DC
  Administered 2021-10-23 – 2021-11-01 (×10): 360 mg via ORAL
  Filled 2021-10-22 (×11): qty 2

## 2021-10-22 MED ORDER — ONDANSETRON HCL 4 MG/2ML IJ SOLN: 4.0000 mg | Freq: Four times a day (QID) | INTRAMUSCULAR | Status: DC | PRN

## 2021-10-22 MED ORDER — LAMOTRIGINE 100 MG PO TABS
200.0000 mg | ORAL_TABLET | Freq: Every day | ORAL | Status: DC
  Administered 2021-10-22 – 2021-11-01 (×11): 200 mg via ORAL
  Filled 2021-10-22 (×11): qty 2

## 2021-10-22 MED ORDER — NICOTINE 21 MG/24HR TD PT24
21.0000 mg | MEDICATED_PATCH | Freq: Every day | TRANSDERMAL | Status: DC
  Administered 2021-10-23 – 2021-11-01 (×10): 21 mg via TRANSDERMAL
  Filled 2021-10-22 (×10): qty 1

## 2021-10-22 MED ORDER — POLYETHYLENE GLYCOL 3350 17 G PO PACK: 17.0000 g | PACK | Freq: Two times a day (BID) | ORAL | Status: DC | PRN

## 2021-10-22 MED ORDER — ACETAMINOPHEN 325 MG PO TABS
650.0000 mg | ORAL_TABLET | Freq: Four times a day (QID) | ORAL | Status: DC | PRN
  Administered 2021-10-22 – 2021-11-01 (×14): 650 mg via ORAL
  Filled 2021-10-22 (×14): qty 2

## 2021-10-22 MED ORDER — ACETAMINOPHEN 650 MG RE SUPP: 650.0000 mg | Freq: Four times a day (QID) | RECTAL | Status: DC | PRN

## 2021-10-22 MED ORDER — THIAMINE HCL 100 MG/ML IJ SOLN
100.0000 mg | Freq: Every day | INTRAMUSCULAR | Status: DC
  Filled 2021-10-22 (×12): qty 1

## 2021-10-22 MED ORDER — VITAMIN B-1 100 MG PO TABS: 100.0000 mg | ORAL_TABLET | Freq: Every day | ORAL | Status: DC

## 2021-10-22 MED ORDER — ENSURE ENLIVE PO LIQD
237.0000 mL | Freq: Two times a day (BID) | ORAL | Status: DC
  Administered 2021-10-22 – 2021-11-01 (×20): 237 mL via ORAL

## 2021-10-22 MED ORDER — ONE-DAILY MULTI VITAMINS PO TABS: 1.0000 | ORAL_TABLET | Freq: Every day | ORAL | Status: DC

## 2021-10-22 MED ORDER — VITAMIN B-6 100 MG PO TABS
100.0000 mg | ORAL_TABLET | Freq: Every day | ORAL | Status: DC
  Administered 2021-10-23 – 2021-11-01 (×10): 100 mg via ORAL
  Filled 2021-10-22 (×11): qty 1

## 2021-10-22 NOTE — Progress Notes (Signed)
Patient transferring to [place] via [facility]. Report called to receiving nurse.
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
## 2021-10-22 NOTE — Plan of Care (Signed)
  Problem: Nutrition: Goal: Adequate nutrition will be maintained Outcome: Progressing   Problem: Pain Managment: Goal: General experience of comfort will improve Outcome: Progressing   Problem: Safety: Goal: Ability to remain free from injury will improve Outcome: Progressing   

## 2021-10-22 NOTE — Progress Notes (Signed)
Inpatient Rehab Admissions Coordinator:    I have a CIR bed for this pt. Today. RN may call report to [phone].  Florean Hoobler, MS, CCC-SLP Rehab Admissions Coordinator  [phone] (celll) [phone] (office)  
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
## 2021-10-22 NOTE — Progress Notes (Signed)
Patient arrived on unit, oriented to unit. Reviewed medications, therapy schedule, rehab routine and plan of care. States an understanding of information reviewed. No complications noted at this time. Patient reports no pain and is AX4 Billy Anderson  

## 2021-10-22 NOTE — TOC Transition Note (Signed)
Transition of Care [facility]) - CM/SW Discharge Note   Patient Details  Name: Billy Anderson MRN: [id number] Date of Birth: May 10, 1965  Transition of Care [facility]) CM/SW Contact:  Leeroy Cha, RN Phone Number: 10/22/2021, 10:40 AM   Clinical Narrative:    [facility]. Transport packet is at the nurses station .  RN aware.   Final next level of care: Acute to Acute Transfer Barriers to Discharge: Barriers Resolved   Patient Goals and CMS Choice Patient states their goals for this hospitalization and ongoing recovery are:: i want to go home CMS Medicare.gov Compare Post Acute Care list provided to:: Patient Choice offered to / list presented to : Patient  Discharge Placement                       Discharge Plan and Services   Discharge Planning Services: CM Consult                                 Social Determinants of Health (SDOH) Interventions Transportation Interventions: Patient Refused Utilities Interventions: Patient Refused   Readmission Risk Interventions   No data to display

## 2021-10-22 NOTE — Progress Notes (Signed)
Inpatient Rehab Admissions Coordinator:    I have a CIR bed for this pt. Today. RN may call report to [phone].  Layci Stenglein, MS, CCC-SLP Rehab Admissions Coordinator  [phone] (celll) [phone] (office)  

## 2021-10-22 NOTE — Discharge Instructions (Addendum)
Inpatient Rehab Discharge Instructions  Billy Anderson Discharge date and time: 11/01/21   Activities/Precautions/ Functional Status: Activity: no lifting, driving, or strenuous exercise till cleared by MD Diet: regular diet Wound Care: Routine skin checks   Functional status:  ___ No restrictions     ___ Walk up steps independently _X__ 24/7 supervision/assistance   ___ Walk up steps with assistance ___ Intermittent supervision/assistance  ___ Bathe/dress independently ___ Walk with walker     _X__ Bathe/dress with assistance ___ Walk Independently    ___ Shower independently ___ Walk with assistance    ___ Shower with assistance ___ No alcohol     ___ Return to work/school ________  COMMUNITY REFERRALS UPON DISCHARGE:    Home health pending if able to find an agency to accept. Social Worker will follow-up on Monday with updates.   Medical Equipment/Items Ordered: 3in1 bedside commode                                                 Agency/Supplier:[facility] [phone]   Special Instructions: NO DRIVING NO ALCOHOL.     My questions have been answered and I understand these instructions. I will adhere to these goals and the provided educational materials after my discharge from the hospital.  Patient/Caregiver Signature _______________________________ Date __________  Clinician Signature _______________________________________ Date __________  Please bring this form and your medication list with you to all your follow-up doctor's appointments.
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
## 2021-10-22 NOTE — Progress Notes (Signed)
Inpatient Rehabilitation Admission Medication Review by a Pharmacist  A complete drug regimen review was completed for this patient to identify any potential clinically significant medication issues.  High Risk Drug Classes Is patient taking? Indication by Medication  Antipsychotic Yes Olanzapine (zyprexa) - Bipolar disorder  Anticoagulant No   Antibiotic No   Opioid No   Antiplatelet No   Hypoglycemics/insulin No   Vasoactive Medication Yes Diltiazem- HTN, Afib  Chemotherapy No   Other Yes Gabapentin- chronic pain Lamotrigine-bipolar disorder Nicotine patch-smoking cessation Protonix-GERD Melatonin-sleep Sodium Chloride tablets- Na supplement     Type of Medication Issue Identified Description of Issue Recommendation(s)  Drug Interaction(s) (clinically significant)     Duplicate Therapy     Allergy     No Medication Administration End Date     Incorrect Dose     Additional Drug Therapy Needed     Significant med changes from prior encounter (inform family/care partners about these prior to discharge). Lisinopril, naltrexone, Humira SQ, prior to admission med recommended to resume on discharge from [facility] acute admission 10/22/21.   Not resumed in CIR admission. Restart PTA meds when and if necessary during CIR admission or at time of discharge, if warranted   Other       Clinically significant medication issues were identified that warrant physician communication and completion of prescribed/recommended actions by midnight of the next day:  No  Name of provider notified for urgent issues identified:   Provider Method of Notification:    Pharmacist comments:   Time spent performing this drug regimen review (minutes):  [place], [place] Clinical Pharmacist [phone]  10/22/2021 3:54 PM Please check AMION for all [place] phone numbers After 10:00 PM, call [place]

## 2021-10-22 NOTE — Progress Notes (Signed)
Physical Therapy Treatment Patient Details Name: Billy Anderson MRN: [id number] DOB: 04-02-1965 Today's Date: 10/22/2021   History of Present Illness Pt is a 56 yr old male who was brought to the hospital with reports of weakness for 2 weeks and diarrhea for 3 days. He was found to have thrombocytopenia and hyponatremia. PMH: HTN, ETOH, L hand surgery, c.diff.  MRI L spine: No acute fracture or suspicious osseous lesion. Chronic  appearing compression deformity of L5, with approximately 75%  vertebral body height loss centrally and 3 mm retropulsion of the  posterosuperior cortex of L5    PT Comments    Pt made significant progress with mobility today, he tolerated increased ambulation distance of 200' with RW, no loss of balance, pt denied pain with mobility.    Recommendations for follow up therapy are one component of a multi-disciplinary discharge planning process, led by the attending physician.  Recommendations may be updated based on patient status, additional functional criteria and insurance authorization.  Follow Up Recommendations  Acute inpatient rehab (3hours/day)     Assistance Recommended at Discharge Intermittent Supervision/Assistance  Patient can return home with the following A little help with walking and/or transfers;A little help with bathing/dressing/bathroom;Assistance with cooking/housework;Assist for transportation;Help with stairs or ramp for entrance   Equipment Recommendations  None recommended by PT    Recommendations for Other Services       Precautions / Restrictions Precautions Precautions: Fall;Back Precaution Comments: back for comfort Restrictions Weight Bearing Restrictions: No     Mobility  Bed Mobility   Bed Mobility: Supine to Sit     Supine to sit: Supervision, HOB elevated     General bed mobility comments: used rail    Transfers Overall transfer level: Needs assistance Equipment used: Rolling walker (2 wheels) Transfers:  Sit to/from Stand Sit to Stand: Min assist, From elevated surface           General transfer comment: min A to power up, VCs hand placement    Ambulation/Gait Ambulation/Gait assistance: Min guard Gait Distance (Feet): 200 Feet Assistive device: Rolling walker (2 wheels) Gait Pattern/deviations: Step-through pattern, Decreased stride length, Trunk flexed Gait velocity: decreased     General Gait Details: steady, no loss of balance, no buckling, pt denied pain   Stairs             Wheelchair Mobility    Modified Rankin (Stroke Patients Only)       Balance Overall balance assessment: Needs assistance Sitting-balance support: Feet supported Sitting balance-Leahy Scale: Good     Standing balance support: Bilateral upper extremity supported Standing balance-Leahy Scale: Poor                              Cognition Arousal/Alertness: Awake/alert Behavior During Therapy: WFL for tasks assessed/performed Overall Cognitive Status: Within Functional Limits for tasks assessed                                          Exercises      General Comments        Pertinent Vitals/Pain Pain Assessment Pain Assessment: No/denies pain    Home Living                          Prior Function  PT Goals (current goals can now be found in the care plan section) Acute Rehab PT Goals Patient Stated Goal: want to be able to stand up from a chair on my own PT Goal Formulation: With patient Time For Goal Achievement: 10/31/21 Potential to Achieve Goals: Good Progress towards PT goals: Progressing toward goals    Frequency    Min 3X/week      PT Plan Current plan remains appropriate    Co-evaluation              AM-PAC PT "6 Clicks" Mobility   Outcome Measure  Help needed turning from your back to your side while in a flat bed without using bedrails?: A Little Help needed moving from lying on your back  to sitting on the side of a flat bed without using bedrails?: A Little Help needed moving to and from a bed to a chair (including a wheelchair)?: A Little Help needed standing up from a chair using your arms (e.g., wheelchair or bedside chair)?: A Little Help needed to walk in hospital room?: A Little Help needed climbing 3-5 steps with a railing? : A Lot 6 Click Score: 17    End of Session Equipment Utilized During Treatment: Gait belt Activity Tolerance: Patient tolerated treatment well Patient left: in chair;with call bell/phone within reach;with chair alarm set;with nursing/sitter in room Nurse Communication: Mobility status PT Visit Diagnosis: Other abnormalities of gait and mobility (R26.89);Unsteadiness on feet (R26.81)     Time: 1012-1027 PT Time Calculation (min) (ACUTE ONLY): 15 min  Charges:  $Gait Training: 8-22 mins                     Blondell Reveal Kistler PT 10/22/2021  Acute Rehabilitation Services  Office [phone]

## 2021-10-22 NOTE — Progress Notes (Signed)
   10/22/21 1500  Clinical Encounter Type  Visited With Patient  Visit Type Initial  Referral From Nurse  Consult/Referral To Chaplain   Chaplain followed up to Spiritual Consult and visited with patient. He indicated that he already had an HCPOA but it is not at the hospital. I spoke with him on the importance of placing a copy in his chart. Patient indicated that his parents have a copy and I suggested that they either email for scan a copy to the hospital. Chaplain spoke with Nurse Leroy Kennedy who will provide the info for the patient.  Melody Haver, Resident Chaplain [phone]

## 2021-10-23 DIAGNOSIS — E871 Hypo-osmolality and hyponatremia: Secondary | ICD-10-CM | POA: Diagnosis not present

## 2021-10-23 DIAGNOSIS — M48062 Spinal stenosis, lumbar region with neurogenic claudication: Secondary | ICD-10-CM | POA: Diagnosis not present

## 2021-10-23 DIAGNOSIS — I480 Paroxysmal atrial fibrillation: Secondary | ICD-10-CM

## 2021-10-23 DIAGNOSIS — F1010 Alcohol abuse, uncomplicated: Secondary | ICD-10-CM

## 2021-10-23 LAB — CBC WITH DIFFERENTIAL/PLATELET
Abs Immature Granulocytes: 0.07 10*3/uL (ref 0.00–0.07)
Basophils Absolute: 0 10*3/uL (ref 0.0–0.1)
Basophils Relative: 1 %
Eosinophils Absolute: 0.1 10*3/uL (ref 0.0–0.5)
Eosinophils Relative: 3 %
HCT: 35.5 % — ABNORMAL LOW (ref 39.0–52.0)
Hemoglobin: 12.6 g/dL — ABNORMAL LOW (ref 13.0–17.0)
Immature Granulocytes: 2 %
Lymphocytes Relative: 25 %
Lymphs Abs: 0.9 10*3/uL (ref 0.7–4.0)
MCH: 37.7 pg — ABNORMAL HIGH (ref 26.0–34.0)
MCHC: 35.5 g/dL (ref 30.0–36.0)
MCV: 106.3 fL — ABNORMAL HIGH (ref 80.0–100.0)
Monocytes Absolute: 0.8 10*3/uL (ref 0.1–1.0)
Monocytes Relative: 21 %
Neutro Abs: 1.8 10*3/uL (ref 1.7–7.7)
Neutrophils Relative %: 48 %
Platelets: 234 10*3/uL (ref 150–400)
RBC: 3.34 MIL/uL — ABNORMAL LOW (ref 4.22–5.81)
RDW: 13.5 % (ref 11.5–15.5)
WBC: 3.7 10*3/uL — ABNORMAL LOW (ref 4.0–10.5)
nRBC: 0 % (ref 0.0–0.2)

## 2021-10-23 LAB — COMPREHENSIVE METABOLIC PANEL
ALT: 29 U/L (ref 0–44)
AST: 27 U/L (ref 15–41)
Albumin: 3.1 g/dL — ABNORMAL LOW (ref 3.5–5.0)
Alkaline Phosphatase: 85 U/L (ref 38–126)
Anion gap: 8 (ref 5–15)
BUN: 15 mg/dL (ref 6–20)
CO2: 26 mmol/L (ref 22–32)
Calcium: 9 mg/dL (ref 8.9–10.3)
Chloride: 99 mmol/L (ref 98–111)
Creatinine, Ser: 0.7 mg/dL (ref 0.61–1.24)
GFR, Estimated: >60 mL/min (ref >60)
Glucose, Bld: 100 mg/dL — ABNORMAL HIGH (ref 70–99)
Potassium: 4.3 mmol/L (ref 3.5–5.1)
Sodium: 133 mmol/L — ABNORMAL LOW (ref 135–145)
Total Bilirubin: 0.7 mg/dL (ref 0.3–1.2)
Total Protein: 6.6 g/dL (ref 6.5–8.1)

## 2021-10-23 LAB — MAGNESIUM: Magnesium: 1.8 mg/dL (ref 1.7–2.4)

## 2021-10-23 MED ORDER — DICLOFENAC SODIUM 1 % EX GEL
2.0000 g | Freq: Four times a day (QID) | CUTANEOUS | Status: DC | PRN
  Filled 2021-10-23: qty 100

## 2021-10-23 MED ORDER — ASPIRIN 81 MG PO TBEC
81.0000 mg | DELAYED_RELEASE_TABLET | Freq: Every day | ORAL | Status: DC
  Administered 2021-10-23 – 2021-11-01 (×10): 81 mg via ORAL
  Filled 2021-10-23 (×10): qty 1

## 2021-10-23 MED ORDER — ENSURE MAX PROTEIN PO LIQD
11.0000 [oz_av] | Freq: Every day | ORAL | Status: DC
  Administered 2021-10-23 – 2021-11-01 (×10): 11 [oz_av] via ORAL

## 2021-10-23 NOTE — Progress Notes (Signed)
Inpatient Rehabilitation Care Coordinator Assessment and Plan Patient Details  Name: Billy Anderson MRN: [id number] Date of Birth: [birth year]  Today's Date: 10/23/2021  Hospital Problems: Principal Problem:   Lumbar spinal stenosis  Past Medical History:  Past Medical History:  Diagnosis Date   Seizures [facility])    Past Surgical History:  Past Surgical History:  Procedure Laterality Date   HAND SURGERY Left    Social History:  reports that he has been smoking cigarettes. He has been smoking an average of 1 pack per day. He does not have any smokeless tobacco history on file. He reports current alcohol use of about 48.0 standard drinks of alcohol per week. He reports that he does not use drugs.  Family / Support Systems Marital Status: Married How Long?: 6 years Patient Roles: Spouse, Parent Spouse/Significant Other: Billy Anderson (wife) Children: Blended family.  Pt has two children from previous relationship. Children live in [place] and will not be helping per pt. Pt wife has two adult children as well and they live in the home. Other Supports: PRn support from children Anticipated Caregiver: Wife Ability/Limitations of Caregiver: Wife is not physically able to lift him. Caregiver Availability: 24/7 Family Dynamics: Pt lives with wife and two adult children.  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 Education: college [place] - How often do you need to have someone help you when you read instructions, pamphlets, or other written material from your doctor or pharmacy?: Never Writes: Yes Employment Status: Disabled Date Retired/Disabled/Unemployed: Pt reports SSDI began in Nov 2022 Legal History/Current Legal Issues: Denies Guardian/Conservator: N/A   Abuse/Neglect Abuse/Neglect  Assessment Can Be Completed: Yes Physical Abuse: Denies Verbal Abuse: Denies Sexual Abuse: Denies Exploitation of patient/patient's resources: Denies Self-Neglect: Denies  Patient response to: Social Isolation - How often do you feel lonely or isolated from those around you?: Never  Emotional Status Pt's affect, behavior and adjustment status: Pt in good spirits at time of visit. Recent Psychosocial Issues: Denies Psychiatric History: Pt admits to diagnosis of bipolar d/o and anxiety. states he was seeing psychiatrist Scientist, research (life sciences)) since relocating from [place]. States he is taking Lamictal 260m, and unable to recall name of other medication. He states that he needs to find a psychiatrist in this area. Substance Abuse History: Pt admits to smoking cigarettes- 1ppd. Admits to alcohol use. States he cut back to only drinking on the weeked in which he is drinking a pint of bourbon/whiskey for the whole weekend or each day on the weekend. Denies rec drug use.  Patient / Family Perceptions, Expectations & Goals Pt/Family understanding of illness & functional limitations: Pt and wife have a general understanding of pt care needs Premorbid pt/family roles/activities: Independent Anticipated changes in roles/activities/participation: Assistance with ADLs/IADLs Pt/family expectations/goals: Pt goal "building up strnegth in legs and be bale to walk again without a RW, as have fallen a few times." SW discussed with pt if the reason for his falls was due to being inebritated. He states that sometimes he would fall when not under the influence and he just became dizzy.  Community Resources [employer] None Premorbid Home Care/DME Agencies: None Transportation available at discharge: Wife Is the patient able to respond to transportation needs?: Yes In the past 12 months, has lack of transportation kept you from medical appointments or from getting medications?: No  In the past 12 months, has  lack of transportation kept you from meetings, work, or from getting things needed for daily living?: No Resource referrals recommended: Neuropsychology  Discharge Planning Living Arrangements: Spouse/significant other, Children Support Systems: Spouse/significant other, Children Type of Residence: Private residence Insurance Resources: [employer] (specify county) ([place]) Museum/gallery curator Resources: SSI Financial Screen Referred: No Living Expenses: Education officer, community Management: Spouse Does the patient have any problems obtaining your medications?: No Home Management: Pt reports his wife manages all homecare needs Patient/Family Preliminary Plans: No changes Care Coordinator Barriers to Discharge: Decreased caregiver support, Insurance for SNF coverage, Lack of/limited family support Care Coordinator Anticipated Follow Up Needs: HH/OP  Clinical Impression SW met with pt in room to introduce self, explain role, and discuss discharge process. Pt is not a English as a second language teacher. No HCPOA. DME: RW, rollator (prefers), and cane. Pt aware SW will follow-up with his wife.   1122-SW spoke with pt wife Billy Anderson to introduce self, explain role, and discuss discharge process. She mentioned being concerned about pt drinking as it is much more than what he has shared (continues to drink daily)--and regard to the impact it will have on his overall health. She reports she is not physically able to lift him. She is willing to continue to support him and bring him home pending his level of care needs. Shared pt had a stay at [facility] earlier in the year. SW informed will provide updates after team conference.   [name] 10/23/2021, 12:27 PM

## 2021-10-23 NOTE — Progress Notes (Addendum)
PROGRESS NOTE   Subjective/Complaints: Pt says he had a pretty good night. Has low back pain with some radiation into legs. Both legs feel week. Denies any bowel or bladder issues other than mild constipation. +cough (smoker)  ROS: Patient denies fever, rash, sore throat, blurred vision, dizziness, nausea, vomiting, diarrhea,  shortness of breath or chest pain, headache, or mood change.    Objective:   No results found. Recent Labs    10/23/21 0532  WBC 3.7*  HGB 12.6*  HCT 35.5*  PLT 234   Recent Labs    10/23/21 0532  NA 133*  K 4.3  CL 99  CO2 26  GLUCOSE 100*  BUN 15  CREATININE 0.70  CALCIUM 9.0    Intake/Output Summary (Last 24 hours) at 10/23/2021 0906 Last data filed at 10/23/2021 0650 Gross per 24 hour  Intake 120 ml  Output 1175 ml  Net -1055 ml     Pressure Injury 10/22/21 Buttocks Right;Medial Stage 2 -  Partial thickness loss of dermis presenting as a shallow open injury with a red, pink wound bed without slough. inner right cheek, open (Active)  10/22/21 1238  Location: Buttocks  Location Orientation: Right;Medial  Staging: Stage 2 -  Partial thickness loss of dermis presenting as a shallow open injury with a red, pink wound bed without slough.  Wound Description (Comments): inner right cheek, open  Present on Admission: Yes     Pressure Injury 10/22/21 Buttocks Right;Distal Stage 2 -  Partial thickness loss of dermis presenting as a shallow open injury with a red, pink wound bed without slough. outer right cheek, open (Active)  10/22/21 1238  Location: Buttocks  Location Orientation: Right;Distal  Staging: Stage 2 -  Partial thickness loss of dermis presenting as a shallow open injury with a red, pink wound bed without slough.  Wound Description (Comments): outer right cheek, open  Present on Admission: Yes    Physical Exam: Vital Signs Blood pressure 120/61, pulse 66, temperature 98.6 F  (37 C), temperature source Oral, resp. rate 20, height 6\' 1"  (1.854 m), weight 80.9 kg, SpO2 97 %.  General: Alert and oriented x 3, No apparent distress HEENT: Head is normocephalic, atraumatic, PERRLA, EOMI, sclera anicteric, oral mucosa pink and moist, dentition intact, ext ear canals clear,  Neck: Supple without JVD or lymphadenopathy Heart: Reg rate and rhythm. No murmurs rubs or gallops Chest: CTA bilaterally without wheezes, rales, or rhonchi; no distress Abdomen: Soft, non-tender, non-distended, bowel sounds positive. Extremities: No clubbing, cyanosis, or edema. Pulses are 2+ Psych: Pt's affect is appropriate. Pt is cooperative Skin: a few scattered abrasions on limbs.   Neuro:  Alert and oriented x 3. Normal insight and awareness. Intact Memory. Normal language and speech. Cranial nerve exam unremarkable. Motor: 5/5 UE, LE: 3+HF, KE and 4/5 ADF/PF. No obvious sensory findings. DTR's trace in LE's. No resting tone. Musculoskeletal: LBP with palpation and SLR in bed.     Assessment/Plan: 1. Functional deficits which require 3+ hours per day of interdisciplinary therapy in a comprehensive inpatient rehab setting. Physiatrist is providing close team supervision and 24 hour management of active medical problems listed below. Physiatrist and rehab team continue  to assess barriers to discharge/monitor patient progress toward functional and medical goals  Care Tool:  Bathing              Bathing assist       Upper Body Dressing/Undressing Upper body dressing        Upper body assist      Lower Body Dressing/Undressing Lower body dressing            Lower body assist       Toileting Toileting    Toileting assist       Transfers Chair/bed transfer  Transfers assist           Locomotion Ambulation   Ambulation assist              Walk 10 feet activity   Assist           Walk 50 feet activity   Assist           Walk 150  feet activity   Assist           Walk 10 feet on uneven surface  activity   Assist           Wheelchair     Assist               Wheelchair 50 feet with 2 turns activity    Assist            Wheelchair 150 feet activity     Assist          Blood pressure 120/61, pulse 66, temperature 98.6 F (37 C), temperature source Oral, resp. rate 20, height 6\' 1"  (1.854 m), weight 80.9 kg, SpO2 97 %.  Medical Problem List and Plan: 1. Functional deficits secondary to ambulatory dysfunction/lumbar spinal stenosis/close compression fracture L5 lumbar vertebra             -patient may shower             -ELOS/Goals: 12-14 days, PT/OT supervision goals            -Patient is beginning CIR therapies today including PT and OT  2.  Antithrombotics: -DVT/anticoagulation:  Mechanical: Antiembolism stockings, thigh (TED hose) Bilateral lower extremities             -antiplatelet therapy: N/A 3. Pain Management/chronic back pain: Neurontin 300 mg twice daily 4. Anxiety/bipolar disorder/Sleep: Melatonin 10 mg nightly as needed.             -antipsychotic agents: Zyprexa 20 mg nightly, Lamictal 200 mg nightly  -appropriate behavior thus far 5. Neuropsych/cognition: This patient is capable of making decisions on his own behalf. 6. Skin/Wound Care: foam dressing to buttock wounds  -pressure relief, nutrition  7. Fluids/Electrolytes/Nutrition: encourage PO  -albumin low--add protein supp  -sodium sl decreased to 133--near current baseline   -continue salt tabs   -pt seems to be eating well presently 8.  PAF.  Not on anticoagulation prior to admission.  Cardiac rate controlled.  Cardizem 360 mg daily.  Aspirin currently on hold. 9.  Alcohol/tobacco abuse.  Provide counseling.  Off CIWA, Continue thiamine as well as folic acid.  Patient on Depade 50 mg daily prior to admission.  NicoDerm patch ongoing 10.  Transaminitis/hyperbilirubinemia.  Pattern consistent with  alcohol.  CK normalized. 11.  Hypertension.  Continue Cardizem.  Lisinopril held due to hyponatremia.    -presently controlled 12.  Elevated AST.  CK within normal limits.  Likely from alcohol.  10/5 WNL 13.  Chronic thrombocytopenia:  Likely due to alcohol abuse.      -platelets up to 234k 10/5--recheck next week 14.  Positive Hemoccult/chronic diarrhea.  H&H stable.  No overt bleeding.  Outpatient follow-up GI. Diarrhea has improved.    Continue PPI  10/5 hgb up to 12.6,  15.  GERD.  Continue Protonix 16.  History of seizure disorder.  Continue Lamictal 17. Hypomagnesemia. Low nl: 1.8 -Continue Mag ox    LOS: 1 days A FACE TO FACE EVALUATION WAS PERFORMED  Meredith Staggers 10/23/2021, 9:06 AM

## 2021-10-23 NOTE — Progress Notes (Signed)
Occupational Therapy Session Note  Patient Details  Name: Billy Anderson MRN: [id number] Date of Birth: 1965/09/11  Today's Date: 10/23/2021 OT Individual Time: 1335-1431 OT Individual Time Calculation (min): 56 min    Short Term Goals: Week 1:  OT Short Term Goal 1 (Week 1): Pt will complete full shower with supervision OT Short Term Goal 2 (Week 1): Pt will groom in standing for 2 tasks to dmeo imrpoved activity tolerance OT Short Term Goal 3 (Week 1): Pt will transfer to toilet with supervision  Skilled Therapeutic Interventions/Progress Updates:   Pt received in recliner with no pain reported   Therapeutic exercise 7 min Nustep reciprocal movement training per pt resquest as a "warm up" at level 6 with encouragement to have SPM >35 throughout with arm length set at 10 4x5 sit to stnads no UE decending height as low as 23" from floor. No more than MIN A needed mostly for facilitation of anterior weight shift and education on body mechanics. Pt states, "I really feel it in my glutes"   Therapeutic activity Total A trnaport to outside courtyard for time management in w/c. Pt completes multiple rounds of functional mobility under minimally uneven concrete pavement to simulate community distances 250 feet or greater. OT adjusts walker 2x for better fit and more upright posture.   Pt left at end of session in recliner with snack, exit alarm on, call light in reach and all needs met   Therapy Documentation Precautions:  Precautions Precautions: Fall, Back Precaution Comments: back for comfort Restrictions Weight Bearing Restrictions: No General: General Chart Reviewed: Yes Family/Caregiver Present: No Vital Signs:    Therapy/Group: Individual Therapy  Tonny Branch 10/23/2021, 12:17 PM

## 2021-10-23 NOTE — Progress Notes (Signed)
Inpatient Rehabilitation  Patient information reviewed and entered into eRehab system by Rigby Swamy Esmeralda Blanford, OTR/L, Rehab Quality Coordinator.   Information including medical coding, functional ability and quality indicators will be reviewed and updated through discharge.   

## 2021-10-23 NOTE — Progress Notes (Signed)
Occupational Therapy Assessment and Plan  Patient Details  Name: Billy Anderson MRN: [id number] Date of Birth: 11/09/1965  OT Diagnosis: abnormal posture, lumbago (low back pain), muscle weakness (generalized), and pain in joint Rehab Potential:   ELOS: 9-12 days   Today's Date: 10/23/2021 OT Individual Time: 0938-1829 OT Individual Time Calculation (min): 75 min     Hospital Problem: Principal Problem:   Lumbar spinal stenosis   Past Medical History:  Past Medical History:  Diagnosis Date   Seizures ([place])    Past Surgical History:  Past Surgical History:  Procedure Laterality Date   HAND SURGERY Left     Assessment & Plan Clinical Impression:   Pt is a 56 yr old male who was brought to the hospital with reports of weakness for 2 weeks and diarrhea for 3 days. He was found to have thrombocytopenia and hyponatremia. PMH: HTN, ETOH, L hand surgery, c.diff.  MRI L spine: No acute fracture or suspicious osseous lesion. Chronic  appearing compression deformity of L5, with approximately 75%  vertebral body height loss centrally and 3 mm retropulsion of the  posterosuperior cortex of L5   Patient currently requires min with basic self-care skills secondary to muscle weakness, decreased cardiorespiratoy endurance, and decreased standing balance, decreased postural control, and decreased balance strategies.  Prior to hospitalization, patient could complete BADL with modified independent .  Patient will benefit from skilled intervention to decrease level of assist with basic self-care skills prior to discharge home with care partner.  Anticipate patient will require intermittent supervision and follow up home health.  OT Assessment OT Barriers to Discharge: Inaccessible home environment;Home environment access/layout OT Patient demonstrates impairments in the following area(s): Balance;Endurance;Motor;Pain;Safety OT Basic ADL's Functional Problem(s):  Grooming;Bathing;Dressing;Toileting OT Transfers Functional Problem(s): Toilet;Tub/Shower OT Additional Impairment(s): Fuctional Use of Upper Extremity OT Plan OT Intensity: Minimum of 1-2 x/day, 45 to 90 minutes OT Frequency: 5 out of 7 days OT Duration/Estimated Length of Stay: 9-12 days OT Treatment/Interventions: Balance/vestibular training;DME/adaptive equipment instruction;Patient/family education;Therapeutic Activities;Wheelchair propulsion/positioning;Therapeutic Exercise;Psychosocial support;Community reintegration;Functional mobility training;Self Care/advanced ADL retraining;UE/LE Strength taining/ROM;UE/LE Coordination activities;Skin care/wound managment;Neuromuscular re-education;Discharge planning;Disease mangement/prevention;Pain management;Splinting/orthotics;Visual/perceptual remediation/compensation OT Self Feeding Anticipated Outcome(s): S OT Basic Self-Care Anticipated Outcome(s): MOD I, S for bathing OT Toileting Anticipated Outcome(s): MOD I OT Bathroom Transfers Anticipated Outcome(s): MOD I toilet. S shower OT Recommendation Patient destination: Home Follow Up Recommendations: Home health OT Equipment Recommended: 3 in 1 bedside comode;To be determined   OT Evaluation Precautions/Restrictions  Precautions Precautions: Fall;Back Precaution Comments: back for comfort Restrictions Weight Bearing Restrictions: No General Chart Reviewed: Yes Family/Caregiver Present: No Vital Signs   Pain Pain Assessment Pain Scale: 0-10 Pain Score: 0-No pain Home Living/Prior Functioning Home Living Family/patient expects to be discharged to:: Private residence Living Arrangements: Spouse/significant other Available Help at Discharge: Family Type of Home: House Home Access: Stairs to enter Technical brewer of Steps: 5 Entrance Stairs-Rails: Left, Right Home Layout: One level Bathroom Shower/Tub: Gaffer, Door Bathroom Accessibility: Yes  Lives With:  Spouse IADL History Homemaking Responsibilities: No Prior Function Level of Independence: Independent with basic ADLs, Independent with homemaking with ambulation  Able to Take Stairs?: Yes Driving: No Vision Baseline Vision/History: 1 Wears glasses Ability to See in Adequate Light: 0 Adequate Patient Visual Report: No change from baseline Vision Assessment?: No apparent visual deficits Perception  Perception: Within Functional Limits Praxis Praxis: Intact Cognition Cognition Overall Cognitive Status: Within Functional Limits for tasks assessed Orientation Level: Person;Place;Situation Person: Oriented Place: Oriented Situation: Oriented Brief Interview for  Mental Status (BIMS) Repetition of Three Words (First Attempt): 3 Temporal Orientation: Year: Correct Temporal Orientation: Month: Accurate within 5 days Temporal Orientation: Day: Correct Recall: "Sock": No, could not recall Recall: "Blue": Yes, no cue required Recall: "Bed": Yes, no cue required BIMS Summary Score: 13 Sensation Sensation Light Touch: Appears Intact (baseline L ulnar nerve sx impaired sensation) Motor  Motor Motor: Abnormal postural alignment and control  Trunk/Postural Assessment  Cervical Assessment Cervical Assessment:  (head forward) Thoracic Assessment Thoracic Assessment:  (rounded shoudlers) Lumbar Assessment Lumbar Assessment:  (posterior pelvic tilt) Postural Control Postural Control: Deficits on evaluation (delayed/reliant on external aides- grab bars/walker)  Balance Balance Balance Assessed: Yes Dynamic Sitting Balance Dynamic Sitting - Level of Assistance: 5: Stand by assistance Static Standing Balance Static Standing - Level of Assistance: 4: Min assist Dynamic Standing Balance Dynamic Standing - Level of Assistance: 3: Mod assist Extremity/Trunk Assessment RUE Assessment RUE Assessment: Exceptions to [facility] General Strength Comments: generalized weakness, uses LUE ot support  funcitonally during overhead tasks to improve reach. can reach 100* shoudler flexion LUE Assessment LUE Assessment: Exceptions to [facility] General Strength Comments: recent shoudler dislocaiton wiht mechanical fall. pt able to reach 80* shoudler flexion, ulnar nerve impingement  Care Tool Care Tool Self Care Eating   Eating Assist Level: Supervision/Verbal cueing    Oral Care    Oral Care Assist Level: Minimal Assistance - Patient > 75%    Bathing   Body parts bathed by patient: Right arm;Left arm;Chest;Abdomen;Front perineal area;Buttocks;Right upper leg;Left upper leg;Right lower leg;Left lower leg;Face     Assist Level: Minimal Assistance - Patient > 75%    Upper Body Dressing(including orthotics)   What is the patient wearing?: Pull over shirt   Assist Level: Set up assist    Lower Body Dressing (excluding footwear)   What is the patient wearing?: Pants Assist for lower body dressing: Minimal Assistance - Patient > 75%    Putting on/Taking off footwear   What is the patient wearing?: Socks;Shoes Assist for footwear: Supervision/Verbal cueing       Care Tool Toileting Toileting activity   Assist for toileting: Minimal Assistance - Patient > 75%     Care Tool Bed Mobility Roll left and right activity        Sit to lying activity        Lying to sitting on side of bed activity         Care Tool Transfers Sit to stand transfer   Sit to stand assist level: Minimal Assistance - Patient > 75%    Chair/bed transfer   Chair/bed transfer assist level: Minimal Assistance - Patient > 75%     Toilet transfer   Assist Level: Minimal Assistance - Patient > 75%     Care Tool Cognition  Expression of Ideas and Wants    Understanding Verbal and Non-Verbal Content     Memory/Recall Ability     Refer to Care Plan for Long Term Goals  SHORT TERM GOAL WEEK 1 OT Short Term Goal 1 (Week 1): Pt will complete full shower with supervision OT Short Term Goal 2 (Week 1): Pt  will groom in standing for 2 tasks to dmeo imrpoved activity tolerance OT Short Term Goal 3 (Week 1): Pt will transfer to toilet with supervision  Recommendations for other services: Therapeutic Recreation  Pet therapy and Outing/community reintegration   Skilled Therapeutic Intervention ADL   MIN A for all LB ADLs Supervision for UB ADLs MIN A standing grooming oral  care Mobility  Bed Mobility Bed Mobility: Supine to Sit Supine to Sit: Supervision/Verbal cueing Transfers Sit to Stand: Minimal Assistance - Patient > 75% Stand to Sit: Minimal Assistance - Patient > 75%  1:1. Pt educated on OT role/purpose, CIR, ELOS, and debility recovery. Pt received in bed and agreeable to OT. Pt provided with BADL at shower level with MIN A for all transfers and LB ADLs. Pt with heavy reliance on UB and arm rest/grab bars to power up into standing d/t LE weakness. Pt reporting goal to be MOD I at home and be able to navigate stairs so able ot go outside. Pt very motivated to improve from CLOF. Exited session with pt seated in bed, exit alarm on and call light in reach  Discharge Criteria: Patient will be discharged from OT if patient refuses treatment 3 consecutive times without medical reason, if treatment goals not met, if there is a change in medical status, if patient makes no progress towards goals or if patient is discharged from hospital.  The above assessment, treatment plan, treatment alternatives and goals were discussed and mutually agreed upon: by patient  Tonny Branch 10/23/2021, 9:30 AM
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
## 2021-10-24 DIAGNOSIS — E871 Hypo-osmolality and hyponatremia: Secondary | ICD-10-CM | POA: Diagnosis not present

## 2021-10-24 DIAGNOSIS — I480 Paroxysmal atrial fibrillation: Secondary | ICD-10-CM | POA: Diagnosis not present

## 2021-10-24 DIAGNOSIS — M48062 Spinal stenosis, lumbar region with neurogenic claudication: Secondary | ICD-10-CM | POA: Diagnosis not present

## 2021-10-24 DIAGNOSIS — L8990 Pressure ulcer of unspecified site, unspecified stage: Secondary | ICD-10-CM | POA: Insufficient documentation

## 2021-10-24 NOTE — Progress Notes (Signed)
[facility] Individual Statement of Services  Patient Name:  Billy Anderson  Date:  10/24/2021  Welcome to the [facility].  Our goal is to provide you with an individualized program based on your diagnosis and situation, designed to meet your specific needs.  With this comprehensive rehabilitation program, you will be expected to participate in at least 3 hours of rehabilitation therapies Monday-Friday, with modified therapy programming on the weekends.  Your rehabilitation program will include the following services:  Physical Therapy (PT), Occupational Therapy (OT), 24 hour per day rehabilitation nursing, Therapeutic Recreaction (TR), Neuropsychology, Care Coordinator, Rehabilitation Medicine, Nutrition Services, and Pharmacy Services  Weekly team conferences will be held on Tuesday to discuss your progress.  Your Inpatient Rehabilitation Care Coordinator will talk with you frequently to get your input and to update you on team discussions.  Team conferences with you and your family in attendance may also be held.  Expected length of stay: 9-12 days  Overall anticipated outcome: supervision-independent level  Depending on your progress and recovery, your program may change. Your Inpatient Rehabilitation Care Coordinator will coordinate services and will keep you informed of any changes. Your Inpatient Rehabilitation Care Coordinator's name and contact numbers are listed  below.  The following services may also be recommended but are not provided by the [facility] will be made to provide these services after discharge if needed.  Arrangements include referral to agencies that provide these services.  Your insurance has been verified to be:  Medicaid Your primary doctor is: none   Pertinent information will be shared  with your doctor and your insurance company.  Inpatient Rehabilitation Care Coordinator:  Cathleen Corti [phone] or [phone]  Information discussed with and copy given to patient by: Elease Hashimoto, 10/24/2021, 9:26 AM

## 2021-10-24 NOTE — Plan of Care (Signed)
  Problem: Consults Goal: RH GENERAL PATIENT EDUCATION Description: See Patient Education module for education specifics. Outcome: Progressing Goal: Skin Care Protocol Initiated - if Braden Score 18 or less Description: If consults are not indicated, leave blank or document N/A Outcome: Progressing Goal: Nutrition Consult-if indicated Outcome: Progressing Goal: Diabetes Guidelines if Diabetic/Glucose > 140 Description: If diabetic or lab glucose is > 140 mg/dl - Initiate Diabetes/Hyperglycemia Guidelines & Document Interventions  Outcome: Progressing   Problem: RH BOWEL ELIMINATION Goal: RH STG MANAGE BOWEL WITH ASSISTANCE Description: STG Manage Bowel with Mod Assistance. Outcome: Progressing Goal: RH STG MANAGE BOWEL W/MEDICATION W/ASSISTANCE Description: STG Manage Bowel with Medication with Assistance. Outcome: Progressing   Problem: RH BLADDER ELIMINATION Goal: RH STG MANAGE BLADDER WITH ASSISTANCE Description: STG Manage Bladder With Assistance Outcome: Progressing Goal: RH STG MANAGE BLADDER WITH MEDICATION WITH ASSISTANCE Description: STG Manage Bladder With Medication With Assistance. Outcome: Progressing Goal: RH STG MANAGE BLADDER WITH EQUIPMENT WITH ASSISTANCE Description: STG Manage Bladder With Equipment With Assistance Outcome: Progressing   Problem: RH SKIN INTEGRITY Goal: RH STG SKIN FREE OF INFECTION/BREAKDOWN Outcome: Progressing Goal: RH STG MAINTAIN SKIN INTEGRITY WITH ASSISTANCE Description: STG Maintain Skin Integrity With Assistance. Outcome: Progressing Goal: RH STG ABLE TO PERFORM INCISION/WOUND CARE W/ASSISTANCE Description: STG Able To Perform Incision/Wound Care With Assistance. Outcome: Progressing   Problem: RH SAFETY Goal: RH STG ADHERE TO SAFETY PRECAUTIONS W/ASSISTANCE/DEVICE Description: STG Adhere to Safety Precautions With Assistance/Device/cues Outcome: Progressing Goal: RH STG DECREASED RISK OF FALL WITH ASSISTANCE Description: STG  Decreased Risk of Fall With Assistance/cues Outcome: Progressing   Problem: RH PAIN MANAGEMENT Goal: RH STG PAIN MANAGED AT OR BELOW PT'S PAIN GOAL Description: Less than 4 wityh PRN meds Outcome: Progressing   Problem: RH KNOWLEDGE DEFICIT GENERAL Goal: RH STG INCREASE KNOWLEDGE OF SELF CARE AFTER HOSPITALIZATION Outcome: Progressing   

## 2021-10-24 NOTE — Progress Notes (Signed)
Occupational Therapy Session Note  Patient Details  Name: Billy Anderson MRN: [id number] Date of Birth: [birth year]  Today's Date: 10/24/2021 OT Individual Time: 3794-4461 OT Individual Time Calculation (min): 41 min   Today's Date: 10/24/2021 OT Individual Time: 9012-2241 OT Individual Time Calculation (min): 40 min   Short Term Goals: Week 1:  OT Short Term Goal 1 (Week 1): Pt will complete full shower with supervision OT Short Term Goal 2 (Week 1): Pt will groom in standing for 2 tasks to dmeo imrpoved activity tolerance OT Short Term Goal 3 (Week 1): Pt will transfer to toilet with supervision  Skilled Therapeutic Interventions/Progress Updates:     Pt received in recliner with minimal back pain. Declined intervention for pain   Therapeutic activity Focus of session on functional mobility with walker to and from tx destinations with CGA-supervision with cuing for upright posture and decreased support from Ues on RW. Pt wanting to practice functional moiblity and balance without RW. Pt strapped into litegait harness for safety and completes functional mobility aroudn RN station in [place] with no UES and practices forwards/backwards mobility. Pt stands in harness for balance and LE coordination task with alternating toe taps to cone as well as SL stance and isolated toe tap to 3 targets.   Pt left at end of session in recliner with exit alarm on, call light in reach and all needs met  Session 2:  Pt received in recliner with no pain   Therapeutic exercise Functional mobility to/from room with RW and supervision.  Circuit training 2x for improved cardioendurance/functional mobility - trial walking with quad cane as this is what pt has at home: 40 feet with cane in LUE to advance with RLE.  - step downs 8x each leg - sit to stand + step up alternating LE x8 - sit ot stand elevated mat to 25 inches with chest press of 1Kg ball  Pt left at end of session in bed with exit alarm  on, call light in reach and all needs met    Therapy Documentation Precautions:  Precautions Precautions: Fall, Back Precaution Comments: back for comfort Restrictions Weight Bearing Restrictions: No General:   Therapy/Group: Individual Therapy  Tonny Branch 10/24/2021, 6:53 AM

## 2021-10-24 NOTE — Progress Notes (Signed)
Occupational Therapy Session Note  Patient Details  Name: Billy Anderson MRN: [id number] Date of Birth: 11-21-1965  Today's Date: 10/24/2021 OT Individual Time: 1117-1200 OT Individual Time Calculation (min): 43 min    Short Term Goals: Week 1:  OT Short Term Goal 1 (Week 1): Pt will complete full shower with supervision OT Short Term Goal 2 (Week 1): Pt will groom in standing for 2 tasks to dmeo imrpoved activity tolerance OT Short Term Goal 3 (Week 1): Pt will transfer to toilet with supervision  Skilled Therapeutic Interventions/Progress Updates: Patient seen for therapeutic endurance activities. Ambulated to therapy gym with CGA at gait belt with patient using walker for balance. Once in the therapy room patient worked on core stability exercises moving from 4 point support to 3 point support through LE extension or UE reach. Patient with natural kypotic posture of thoracic region. Worked on scapular approximation sitting to improve posture for all activites. Standing activity tolerance with walker for finger tip balance as needed tolerated 3 minute and 5 minute stands without complaint. Concluded treatment with Proximal UE stability and self ROM exercises focusing on a pain free ROM to maintain smooth GH rhythm. Ambulated back to his room with CGA.     Therapy Documentation Precautions:  Precautions Precautions: Fall, Back Precaution Comments: back for comfort Restrictions Weight Bearing Restrictions: No General:   Vital Signs: Therapy Vitals Pulse Rate: 88 BP: (!) 141/99 Pain:5/10      Therapy/Group: Individual Therapy  Hermina Barters 10/24/2021, 1:02 PM

## 2021-10-24 NOTE — Progress Notes (Signed)
PROGRESS NOTE   Subjective/Complaints: Pt is up early with in gym. No problems overnight. Says pain is controlled. Still feels weak in his legs.   ROS: Patient denies fever, rash, sore throat, blurred vision, dizziness, nausea, vomiting, diarrhea, cough, shortness of breath or chest pain,   headache, or mood change.    Objective:   No results found. Recent Labs    10/23/21 0532  WBC 3.7*  HGB 12.6*  HCT 35.5*  PLT 234   Recent Labs    10/23/21 0532  NA 133*  K 4.3  CL 99  CO2 26  GLUCOSE 100*  BUN 15  CREATININE 0.70  CALCIUM 9.0    Intake/Output Summary (Last 24 hours) at 10/24/2021 1053 Last data filed at 10/24/2021 0900 Gross per 24 hour  Intake 477 ml  Output 2000 ml  Net -1523 ml     Pressure Injury 10/22/21 Buttocks Right;Medial Stage 2 -  Partial thickness loss of dermis presenting as a shallow open injury with a red, pink wound bed without slough. inner right cheek, open (Active)  10/22/21 1238  Location: Buttocks  Location Orientation: Right;Medial  Staging: Stage 2 -  Partial thickness loss of dermis presenting as a shallow open injury with a red, pink wound bed without slough.  Wound Description (Comments): inner right cheek, open  Present on Admission: Yes     Pressure Injury 10/22/21 Buttocks Right;Distal Stage 2 -  Partial thickness loss of dermis presenting as a shallow open injury with a red, pink wound bed without slough. outer right cheek, open (Active)  10/22/21 1238  Location: Buttocks  Location Orientation: Right;Distal  Staging: Stage 2 -  Partial thickness loss of dermis presenting as a shallow open injury with a red, pink wound bed without slough.  Wound Description (Comments): outer right cheek, open  Present on Admission: Yes    Physical Exam: Vital Signs Blood pressure (!) 141/99, pulse 88, temperature 99 F (37.2 C), resp. rate 17, height 6\' 1"  (1.854 m), weight 80.9 kg,  SpO2 100 %.  Constitutional: No distress . Vital signs reviewed. HEENT: NCAT, EOMI, oral membranes moist Neck: supple Cardiovascular: RRR without murmur. No JVD    Respiratory/Chest: CTA Bilaterally without wheezes or rales. Normal effort    GI/Abdomen: BS +, non-tender, non-distended Ext: no clubbing, cyanosis, or edema Psych: pleasant and cooperative  Skin: a few scattered abrasions on limbs. Below area is dressed.   Neuro:  Alert and oriented x 3. Normal insight and awareness. Intact Memory. Normal language and speech. Cranial nerve exam unremarkable. Motor: 5/5 UE, LE: 3+HF, KE and 4/5 ADF/PF. No obvious sensory findings. DTR's trace in LE's. No resting tone. Musculoskeletal: forward flexed lumbar posture when standing. Right leg may be a little stronger than right  .      Assessment/Plan: 1. Functional deficits which require 3+ hours per day of interdisciplinary therapy in a comprehensive inpatient rehab setting. Physiatrist is providing close team supervision and 24 hour management of active medical problems listed below. Physiatrist and rehab team continue to assess barriers to discharge/monitor patient progress toward functional and medical goals  Care Tool:  Bathing    Body parts bathed by patient:  Right arm, Left arm, Chest, Abdomen, Front perineal area, Buttocks, Right upper leg, Left upper leg, Right lower leg, Left lower leg, Face         Bathing assist Assist Level: Minimal Assistance - Patient > 75%     Upper Body Dressing/Undressing Upper body dressing   What is the patient wearing?: Pull over shirt    Upper body assist Assist Level: Set up assist    Lower Body Dressing/Undressing Lower body dressing      What is the patient wearing?: Pants     Lower body assist Assist for lower body dressing: Minimal Assistance - Patient > 75%     Toileting Toileting    Toileting assist Assist for toileting: Minimal Assistance - Patient > 75%      Transfers Chair/bed transfer  Transfers assist     Chair/bed transfer assist level: Moderate Assistance - Patient 50 - 74%     Locomotion Ambulation   Ambulation assist      Assist level: Contact Guard/Touching assist Assistive device: Walker-rolling Max distance: 294 ft   Walk 10 feet activity   Assist     Assist level: Contact Guard/Touching assist Assistive device: Walker-rolling   Walk 50 feet activity   Assist    Assist level: Contact Guard/Touching assist Assistive device: Walker-rolling    Walk 150 feet activity   Assist    Assist level: Contact Guard/Touching assist Assistive device: Walker-rolling    Walk 10 feet on uneven surface  activity   Assist     Assist level: Minimal Assistance - Patient > 75% Assistive device: Walker-rolling   Wheelchair     Assist Is the patient using a wheelchair?: Yes Type of Wheelchair: Manual    Wheelchair assist level: Supervision/Verbal cueing Max wheelchair distance: >150 ft    Wheelchair 50 feet with 2 turns activity    Assist        Assist Level: Supervision/Verbal cueing   Wheelchair 150 feet activity     Assist      Assist Level: Supervision/Verbal cueing   Blood pressure (!) 141/99, pulse 88, temperature 99 F (37.2 C), resp. rate 17, height 6\' 1"  (1.854 m), weight 80.9 kg, SpO2 100 %.  Medical Problem List and Plan: 1. Functional deficits secondary to ambulatory dysfunction/lumbar spinal stenosis/close compression fracture L5 lumbar vertebra             -patient may shower             -ELOS/Goals: 12-14 days, PT/OT supervision goals            -Continue CIR therapies including PT, OT  2.  Antithrombotics: -DVT/anticoagulation:  Mechanical: Antiembolism stockings, thigh (TED hose) Bilateral lower extremities             -antiplatelet therapy: N/A 3. Pain Management/chronic back pain: Neurontin 300 mg twice daily 4. Anxiety/bipolar disorder/Sleep: Melatonin 10 mg  nightly as needed.             -antipsychotic agents: Zyprexa 20 mg nightly, Lamictal 200 mg nightly  -very appropriate behavior thus far 5. Neuropsych/cognition: This patient is capable of making decisions on his own behalf. 6. Skin/Wound Care: foam dressing to buttock wounds  -pressure relief, nutrition  7. Fluids/Electrolytes/Nutrition: encourage PO  -albumin low--add protein supp  -sodium sl decreased to 133--near current baseline   -continue salt tabs   -  eating well presently   -recheck bmet on Monday  8.  PAF.  Not on anticoagulation prior to admission.  Cardiac  rate controlled.  Cardizem 360 mg daily.  Aspirin currently on hold.  HR controlled 9.  Alcohol/tobacco abuse.  Provide counseling.  Off CIWA, Continue thiamine as well as folic acid.  Patient on Depade 50 mg daily prior to admission.  NicoDerm patch ongoing 10.  Transaminitis/hyperbilirubinemia.  Pattern consistent with alcohol.  CK normalized. 11.  Hypertension.  Continue Cardizem.  Lisinopril held due to hyponatremia.    -10/6 presently controlled 12.  Elevated AST.  CK within normal limits.  Likely from alcohol.    10/5 WNL 13.  Chronic thrombocytopenia:  Likely due to alcohol abuse.      -platelets up to 234k 10/5--recheck next week 14.  Positive Hemoccult/chronic diarrhea.  H&H stable.  No overt bleeding.  Outpatient follow-up GI. Diarrhea has improved.    Continue PPI  10/5 hgb up to 12.6. follow up on Monday  15.  GERD.  Continue Protonix 16.  History of seizure disorder.  Continue Lamictal 17. Hypomagnesemia. Low nl: 1.8 -Continue Mag ox    LOS: 2 days A FACE TO FACE EVALUATION WAS PERFORMED  Meredith Staggers 10/24/2021, 10:53 AM

## 2021-10-24 NOTE — Progress Notes (Signed)
Physical Therapy Session Note  Patient Details  Name: Billy Anderson MRN: [id number] Date of Birth: May 22, 1965  Today's Date: 10/24/2021 PT Individual Time: 0809-0905 PT Individual Time Calculation (min): 56 min   Short Term Goals: Week 1:  PT Short Term Goal 1 (Week 1): Patient will perform basic transfers with supervision consistently using LRAD. PT Short Term Goal 2 (Week 1): Patient will improve score on Berg Balance Scale by 7 points to meet MCID for reduced fall risk. PT Short Term Goal 3 (Week 1): Patient will ambulate with supervision >400 feet using LRAD.  Skilled Therapeutic Interventions/Progress Updates:  Patient seated upright in recliner on entrance to room. Patient alert and agreeable to PT session. Not fully understanding of reason for and use of seat pad and provided with education on use at start of rehab and then reassessment as pt progresses while here.   Patient with no pain complaint at start of session.  Therapeutic Activity: Transfers: Pt performed sit<>stand and stand pivot transfer from recliner to bed and using tray table for UE support. Education on using proper AD for more safely mobilizing from seat to seat and ambulating. RW provided and pt collects clothing to change into for this day.  He then completes pivot stepping to sit at EOB using RW to initiate dressing. LB and UB dressing completed with supervision while seated EOB.   Gait Training:  Pt ambulated ~150' x2 using RW with close supervision/ CGA. Demonstrated flexed posture with RW held out from pt. Provided vc/ tc for more upright posture with hold of RW just ahead of pt. Pt able to correct, however cannot correct posture at this time d/t back pain.  Discussion with pt re: impairments and home setup. Discussed fear of falling on steps during descent. Disc/ Educ re: visualization of ground being further away in descent and increasing FOF. Pt also only able to use one HR ata time due width of steps.  Discussion re: potential sidestep technique to change view and provide BUE support at one rail. Pt guided in stair training with physical demonstration and verbal instructions provided prior to performance. Pt is able to complete four 6" steps using RHR with BUE and leading with LLE and CGA to descend leading with RLE with same HR use. Pt then ascends and descends steps using opposing HR to L side and feel as though he is stronger to this side. Pt also appears to be stronger this way.   Therapeutic Exercise: Pt guided in continuous reciprocation of BUE and BLE using NuStep L5 x 1min/ L6x23min/ L5 x 23min with focus on maintaining good speed, full AROM bilaterally, strengthening, and improving activity tolerance. Pt demos increased resp rate of 22 breaths/ min at end of bout. Improves to 19 with 84min rest break.  Patient seated upright in recliner at end of session with brakes locked, seat pad alarm set, and all needs within reach.   Therapy Documentation Precautions:  Precautions Precautions: Fall, Back Precaution Comments: back for comfort Restrictions Weight Bearing Restrictions: No General:   Vital Signs: Therapy Vitals Pulse Rate: 88 BP: (!) 141/99 Pain:  No pain related throughout session this day.  Mobility:   Locomotion :    Trunk/Postural Assessment :    Balance:   Exercises:   Other Treatments:      Therapy/Group: Individual Therapy  Alger Simons PT, DPT, CSRS 10/24/2021, 12:53 PM

## 2021-10-24 NOTE — IPOC Note (Signed)
Overall Plan of Care [facility]) Patient Details Name: Billy Anderson MRN: [id number] DOB: 02-23-1965  Admitting Diagnosis: Lumbar spinal stenosis  Hospital Problems: Principal Problem:   Lumbar spinal stenosis Active Problems:   Pressure injury of skin     Functional Problem List: Nursing Bladder, Bowel, Endurance, Medication Management, Pain, Safety, Skin Integrity  PT Balance, Pain, Safety, Edema, Endurance, Sensory, Skin Integrity, Motor, Nutrition  OT Balance, Endurance, Motor, Pain, Safety  SLP    TR         Basic ADL's: OT Grooming, Bathing, Dressing, Toileting     Advanced  ADL's: OT       Transfers: PT Bed Mobility, Bed to Chair, Car, [employer], Floor  OT Toilet, Metallurgist: PT Ambulation, Emergency planning/management officer, Stairs     Additional Impairments: OT Fuctional Use of Upper Extremity  SLP        TR      Anticipated Outcomes Item Anticipated Outcome  Self Feeding S  Swallowing      Basic self-care  MOD I, S for bathing  Toileting  MOD I   Bathroom Transfers MOD I toilet. S shower  Bowel/Bladder  Continent of bowel and bladder with min assist  Transfers  mod I using LRAD  Locomotion  mod I using LRAD household, supervision community  Communication     Cognition     Pain  less than or equal to 4/10 with prn medes.l  Safety/Judgment  free from falls/injury making appropriate safety decisions with ques.   Therapy Plan: PT Intensity: Minimum of 1-2 x/day ,45 to 90 minutes PT Frequency: 5 out of 7 days PT Duration Estimated Length of Stay: 9-12 days OT Intensity: Minimum of 1-2 x/day, 45 to 90 minutes OT Frequency: 5 out of 7 days OT Duration/Estimated Length of Stay: 9-12 days     Team Interventions: Nursing Interventions Patient/Family Education, Bladder Management, Bowel Management, Pain Management, Medication Management, Discharge Planning, Psychosocial Support  PT interventions Ambulation/gait training, Discharge planning,  DME/adaptive equipment instruction, Functional mobility training, Pain management, Psychosocial support, Splinting/orthotics, Therapeutic Activities, UE/LE Strength taining/ROM, Training and development officer, Community reintegration, Disease management/prevention, Technical sales engineer stimulation, Neuromuscular re-education, Patient/family education, Skin care/wound management, Stair training, Therapeutic Exercise, UE/LE Coordination activities, Wheelchair propulsion/positioning  OT Interventions Training and development officer, Engineer, drilling, Patient/family education, Therapeutic Activities, Wheelchair propulsion/positioning, Therapeutic Exercise, Psychosocial support, Community reintegration, Functional mobility training, Self Care/advanced ADL retraining, UE/LE Strength taining/ROM, UE/LE Coordination activities, Skin care/wound managment, Neuromuscular re-education, Discharge planning, Disease mangement/prevention, Pain management, Splinting/orthotics, Visual/perceptual remediation/compensation  SLP Interventions    TR Interventions    SW/CM Interventions Psychosocial Support, Patient/Family Education, Discharge Planning   Barriers to Discharge MD  Medical stability  Nursing Incontinence, Decreased caregiver support, Home environment access/layout 1 level home w/5step entry. Lives with spouse  PT Home environment access/layout, Decreased caregiver support, Lack of/limited family support    OT Inaccessible home environment, Home environment access/layout    SLP      SW Decreased caregiver support, Insurance for SNF coverage, Lack of/limited family support     Team Discharge Planning: Destination: PT-Home ,OT- Home , SLP-  Projected Follow-up: PT-Outpatient PT, OT-  Home health OT, SLP-  Projected Equipment Needs: PT-To be determined, OT- 3 in 1 bedside comode, To be determined, SLP-  Equipment Details: PT-pt has a rollator and SPC, OT-  Patient/family involved in discharge  planning: PT- Patient,  OT-Patient, SLP-   MD ELOS: 9-12 days Medical Rehab Prognosis:  Excellent Assessment: The patient has been admitted  for CIR therapies with the diagnosis of lumbar spinal stenosis with bilateral LE weakness. The team will be addressing functional mobility, strength, stamina, balance, safety, adaptive techniques and equipment, self-care, bowel and bladder mgt, patient and caregiver education, NMR, pain control, community reentry. Goals have been set at mod I for mobility and self-care. Anticipated discharge destination is home with family.        See Team Conference Notes for weekly updates to the plan of care

## 2021-10-25 DIAGNOSIS — M48061 Spinal stenosis, lumbar region without neurogenic claudication: Secondary | ICD-10-CM | POA: Diagnosis not present

## 2021-10-25 DIAGNOSIS — K5900 Constipation, unspecified: Secondary | ICD-10-CM

## 2021-10-25 DIAGNOSIS — D696 Thrombocytopenia, unspecified: Secondary | ICD-10-CM

## 2021-10-25 DIAGNOSIS — I10 Essential (primary) hypertension: Secondary | ICD-10-CM | POA: Diagnosis not present

## 2021-10-25 NOTE — Progress Notes (Signed)
PROGRESS NOTE   Subjective/Complaints: Pt sitting in bedside chair. Asks for senokot to be scheduled. No other concerns or complaints.   ROS: Patient denies fever, rash, sore throat, blurred vision, dizziness, nausea, vomiting, diarrhea, cough, shortness of breath or chest pain,   headache, or mood change.    Objective:   No results found. Recent Labs    10/23/21 0532  WBC 3.7*  HGB 12.6*  HCT 35.5*  PLT 234    Recent Labs    10/23/21 0532  NA 133*  K 4.3  CL 99  CO2 26  GLUCOSE 100*  BUN 15  CREATININE 0.70  CALCIUM 9.0     Intake/Output Summary (Last 24 hours) at 10/25/2021 1652 Last data filed at 10/25/2021 1500 Gross per 24 hour  Intake 460 ml  Output 2000 ml  Net -1540 ml      Pressure Injury 10/22/21 Buttocks Right;Medial Stage 2 -  Partial thickness loss of dermis presenting as a shallow open injury with a red, pink wound bed without slough. inner right cheek, open (Active)  10/22/21 1238  Location: Buttocks  Location Orientation: Right;Medial  Staging: Stage 2 -  Partial thickness loss of dermis presenting as a shallow open injury with a red, pink wound bed without slough.  Wound Description (Comments): inner right cheek, open  Present on Admission: Yes     Pressure Injury 10/22/21 Buttocks Right;Distal Stage 2 -  Partial thickness loss of dermis presenting as a shallow open injury with a red, pink wound bed without slough. outer right cheek, open (Active)  10/22/21 1238  Location: Buttocks  Location Orientation: Right;Distal  Staging: Stage 2 -  Partial thickness loss of dermis presenting as a shallow open injury with a red, pink wound bed without slough.  Wound Description (Comments): outer right cheek, open  Present on Admission: Yes    Physical Exam: Vital Signs Blood pressure 129/78, pulse 74, temperature 98.9 F (37.2 C), temperature source Oral, resp. rate 16, height 6\' 1"  (1.854 m),  weight 80.9 kg, SpO2 98 %.  Constitutional: No distress . Vital signs reviewed. HEENT: NCAT, conjugate gaze, oral membranes moist Neck: supple Cardiovascular: RRR without murmur. No JVD    Respiratory/Chest: CTA Bilaterally without wheezes or rales. Normal effort    GI/Abdomen: BS +, non-tender, non-distended Ext: no clubbing, cyanosis, or edema Psych: pleasant and cooperative  Skin: a few scattered abrasions on limbs. Below area is dressed.   Neuro:  Alert and oriented x 3. Follows commands. Normal insight and awareness. Intact Memory. Normal language and speech. Cranial nerve exam unremarkable. Motor: 5/5 UE, LE: 3+HF, KE and 4/5 ADF/PF. No obvious sensory findings. DTR's trace in LE's. No resting tone. Musculoskeletal: forward flexed lumbar posture when standing. Right leg may be a little stronger than right  .      Assessment/Plan: 1. Functional deficits which require 3+ hours per day of interdisciplinary therapy in a comprehensive inpatient rehab setting. Physiatrist is providing close team supervision and 24 hour management of active medical problems listed below. Physiatrist and rehab team continue to assess barriers to discharge/monitor patient progress toward functional and medical goals  Care Tool:  Bathing    Body  parts bathed by patient: Right arm, Left arm, Chest, Abdomen, Front perineal area, Buttocks, Right upper leg, Left upper leg, Right lower leg, Left lower leg, Face         Bathing assist Assist Level: Minimal Assistance - Patient > 75%     Upper Body Dressing/Undressing Upper body dressing   What is the patient wearing?: Pull over shirt    Upper body assist Assist Level: Set up assist    Lower Body Dressing/Undressing Lower body dressing      What is the patient wearing?: Pants     Lower body assist Assist for lower body dressing: Minimal Assistance - Patient > 75%     Toileting Toileting    Toileting assist Assist for toileting: Minimal  Assistance - Patient > 75%     Transfers Chair/bed transfer  Transfers assist     Chair/bed transfer assist level: Moderate Assistance - Patient 50 - 74%     Locomotion Ambulation   Ambulation assist      Assist level: Contact Guard/Touching assist Assistive device: Walker-rolling Max distance: 294 ft   Walk 10 feet activity   Assist     Assist level: Contact Guard/Touching assist Assistive device: Walker-rolling   Walk 50 feet activity   Assist    Assist level: Contact Guard/Touching assist Assistive device: Walker-rolling    Walk 150 feet activity   Assist    Assist level: Contact Guard/Touching assist Assistive device: Walker-rolling    Walk 10 feet on uneven surface  activity   Assist     Assist level: Minimal Assistance - Patient > 75% Assistive device: Walker-rolling   Wheelchair     Assist Is the patient using a wheelchair?: Yes Type of Wheelchair: Manual    Wheelchair assist level: Supervision/Verbal cueing Max wheelchair distance: >150 ft    Wheelchair 50 feet with 2 turns activity    Assist        Assist Level: Supervision/Verbal cueing   Wheelchair 150 feet activity     Assist      Assist Level: Supervision/Verbal cueing   Blood pressure 129/78, pulse 74, temperature 98.9 F (37.2 C), temperature source Oral, resp. rate 16, height 6\' 1"  (1.854 m), weight 80.9 kg, SpO2 98 %.  Medical Problem List and Plan: 1. Functional deficits secondary to ambulatory dysfunction/lumbar spinal stenosis/close compression fracture L5 lumbar vertebra             -patient may shower             -ELOS/Goals: 12-14 days, PT/OT supervision goals            -Continue CIR therapies including PT, OT  2.  Antithrombotics: -DVT/anticoagulation:  Mechanical: Antiembolism stockings, thigh (TED hose) Bilateral lower extremities             -antiplatelet therapy: N/A 3. Pain Management/chronic back pain: Neurontin 300 mg twice  daily 4. Anxiety/bipolar disorder/Sleep: Melatonin 10 mg nightly as needed.             -antipsychotic agents: Zyprexa 20 mg nightly, Lamictal 200 mg nightly  -very appropriate behavior thus far 5. Neuropsych/cognition: This patient is capable of making decisions on his own behalf. 6. Skin/Wound Care: foam dressing to buttock wounds  -pressure relief, nutrition  7. Fluids/Electrolytes/Nutrition: encourage PO  -albumin low--add protein supp  -sodium sl decreased to 133--near current baseline   -continue salt tabs   -  eating well presently   -recheck bmet on Monday  8.  PAF.  Not on  anticoagulation prior to admission.  Cardiac rate controlled.  Cardizem 360 mg daily.  Aspirin currently on hold.  HR controlled 9.  Alcohol/tobacco abuse.  Provide counseling.  Off CIWA, Continue thiamine as well as folic acid.  Patient on Depade 50 mg daily prior to admission.  NicoDerm patch ongoing 10.  Transaminitis/hyperbilirubinemia.  Pattern consistent with alcohol.  CK normalized. 11.  Hypertension.  Continue Cardizem.  Lisinopril held due to hyponatremia.    -10/7 well controlled, continue cardizem 12.  Elevated AST.  CK within normal limits.  Likely from alcohol.    10/5 WNL 13.  Chronic thrombocytopenia:  Likely due to alcohol abuse.      -platelets up to 234k 10/5--recheck monday 14.  Positive Hemoccult/chronic diarrhea.  H&H stable.  No overt bleeding.  Outpatient follow-up GI. Diarrhea has improved.    Continue PPI  10/5 hgb up to 12.6. follow up on Monday   10/7 last BM today, Schedule Senokot to prevent constipation 15.  GERD.  Continue Protonix 16.  History of seizure disorder.  Continue Lamictal 17. Hypomagnesemia. Low nl: 1.8 -Continue Mag ox -Recheck monday    LOS: 3 days A FACE TO FACE EVALUATION WAS PERFORMED  Jennye Boroughs 10/25/2021, 4:52 PM

## 2021-10-25 NOTE — Plan of Care (Signed)
  Problem: Consults Goal: RH GENERAL PATIENT EDUCATION Description: See Patient Education module for education specifics. Outcome: Progressing Goal: Skin Care Protocol Initiated - if Braden Score 18 or less Description: If consults are not indicated, leave blank or document N/A Outcome: Progressing Goal: Nutrition Consult-if indicated Outcome: Progressing Goal: Diabetes Guidelines if Diabetic/Glucose > 140 Description: If diabetic or lab glucose is > 140 mg/dl - Initiate Diabetes/Hyperglycemia Guidelines & Document Interventions  Outcome: Progressing   Problem: RH BOWEL ELIMINATION Goal: RH STG MANAGE BOWEL WITH ASSISTANCE Description: STG Manage Bowel with Mod Assistance. Outcome: Progressing Goal: RH STG MANAGE BOWEL W/MEDICATION W/ASSISTANCE Description: STG Manage Bowel with Medication with Assistance. Outcome: Progressing   Problem: RH BLADDER ELIMINATION Goal: RH STG MANAGE BLADDER WITH ASSISTANCE Description: STG Manage Bladder With Assistance Outcome: Progressing Goal: RH STG MANAGE BLADDER WITH MEDICATION WITH ASSISTANCE Description: STG Manage Bladder With Medication With Assistance. Outcome: Progressing Goal: RH STG MANAGE BLADDER WITH EQUIPMENT WITH ASSISTANCE Description: STG Manage Bladder With Equipment With Assistance Outcome: Progressing   Problem: RH SKIN INTEGRITY Goal: RH STG SKIN FREE OF INFECTION/BREAKDOWN Outcome: Progressing Goal: RH STG MAINTAIN SKIN INTEGRITY WITH ASSISTANCE Description: STG Maintain Skin Integrity With Assistance. Outcome: Progressing Goal: RH STG ABLE TO PERFORM INCISION/WOUND CARE W/ASSISTANCE Description: STG Able To Perform Incision/Wound Care With Assistance. Outcome: Progressing   Problem: RH SAFETY Goal: RH STG ADHERE TO SAFETY PRECAUTIONS W/ASSISTANCE/DEVICE Description: STG Adhere to Safety Precautions With Assistance/Device/cues Outcome: Progressing Goal: RH STG DECREASED RISK OF FALL WITH ASSISTANCE Description: STG  Decreased Risk of Fall With Assistance/cues Outcome: Progressing   Problem: RH PAIN MANAGEMENT Goal: RH STG PAIN MANAGED AT OR BELOW PT'S PAIN GOAL Description: Less than 4 wityh PRN meds Outcome: Progressing   Problem: RH KNOWLEDGE DEFICIT GENERAL Goal: RH STG INCREASE KNOWLEDGE OF SELF CARE AFTER HOSPITALIZATION Outcome: Progressing

## 2021-10-25 NOTE — Progress Notes (Signed)
Occupational Therapy Session Note  Patient Details  Name: Billy Anderson MRN: [id number] Date of Birth: 1965-03-11  Today's Date: 10/25/2021 OT Individual Time: 5929-2446 OT Individual Time Calculation (min): 55 min    Short Term Goals: Week 1:  OT Short Term Goal 1 (Week 1): Pt will complete full shower with supervision OT Short Term Goal 2 (Week 1): Pt will groom in standing for 2 tasks to dmeo imrpoved activity tolerance OT Short Term Goal 3 (Week 1): Pt will transfer to toilet with supervision  Skilled Therapeutic Interventions/Progress Updates:     Pt received in recliner, with no pain and agreeable to shower.   ADL: Pt completes ADL at overall Supervision-CGA Level. Skilled interventions include: cuing for hand placement on AD and surface for functional transfers, use of figure 4 to decrease bending/pain in back and cuing for safety awareness/RW management. Pt able ot gather clothing at ambulatory level and tranport hanging on RW. Grooming completed seated at sink for energy conservation.  Therapeutic exercise Functional mobility with RW to/from room with Supervision and cuing for more upright posture and less weight on handles.   7 min Nustep training per pt request as this is pt only tx this date. Workload 7 for generalized endurance improvement keeping SPM >40  6 STS with hands on mat and 1LE up of 3 inch step for weight shift and targeted quad activation with supervision  Pt left at end of session in recliner with exit alarm on, call light in reach and all needs met   Therapy Documentation Precautions:  Precautions Precautions: Fall, Back Precaution Comments: back for comfort Restrictions Weight Bearing Restrictions: No    Therapy/Group: Individual Therapy  Tonny Branch 10/25/2021, 6:52 AM
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
## 2021-10-26 NOTE — Plan of Care (Signed)
  Problem: Consults Goal: RH GENERAL PATIENT EDUCATION Description: See Patient Education module for education specifics. Outcome: Progressing Goal: Skin Care Protocol Initiated - if Braden Score 18 or less Description: If consults are not indicated, leave blank or document N/A Outcome: Progressing Goal: Nutrition Consult-if indicated Outcome: Progressing Goal: Diabetes Guidelines if Diabetic/Glucose > 140 Description: If diabetic or lab glucose is > 140 mg/dl - Initiate Diabetes/Hyperglycemia Guidelines & Document Interventions  Outcome: Progressing   Problem: RH BOWEL ELIMINATION Goal: RH STG MANAGE BOWEL WITH ASSISTANCE Description: STG Manage Bowel with Mod Assistance. Outcome: Progressing Goal: RH STG MANAGE BOWEL W/MEDICATION W/ASSISTANCE Description: STG Manage Bowel with Medication with Assistance. Outcome: Progressing   Problem: RH BLADDER ELIMINATION Goal: RH STG MANAGE BLADDER WITH ASSISTANCE Description: STG Manage Bladder With Assistance Outcome: Progressing Goal: RH STG MANAGE BLADDER WITH MEDICATION WITH ASSISTANCE Description: STG Manage Bladder With Medication With Assistance. Outcome: Progressing Goal: RH STG MANAGE BLADDER WITH EQUIPMENT WITH ASSISTANCE Description: STG Manage Bladder With Equipment With Assistance Outcome: Progressing   Problem: RH SKIN INTEGRITY Goal: RH STG SKIN FREE OF INFECTION/BREAKDOWN Outcome: Progressing Goal: RH STG MAINTAIN SKIN INTEGRITY WITH ASSISTANCE Description: STG Maintain Skin Integrity With Assistance. Outcome: Progressing Goal: RH STG ABLE TO PERFORM INCISION/WOUND CARE W/ASSISTANCE Description: STG Able To Perform Incision/Wound Care With Assistance. Outcome: Progressing   Problem: RH SAFETY Goal: RH STG ADHERE TO SAFETY PRECAUTIONS W/ASSISTANCE/DEVICE Description: STG Adhere to Safety Precautions With Assistance/Device/cues Outcome: Progressing Goal: RH STG DECREASED RISK OF FALL WITH ASSISTANCE Description: STG  Decreased Risk of Fall With Assistance/cues Outcome: Progressing   Problem: RH PAIN MANAGEMENT Goal: RH STG PAIN MANAGED AT OR BELOW PT'S PAIN GOAL Description: Less than 4 wityh PRN meds Outcome: Progressing   Problem: RH KNOWLEDGE DEFICIT GENERAL Goal: RH STG INCREASE KNOWLEDGE OF SELF CARE AFTER HOSPITALIZATION Outcome: Progressing   

## 2021-10-26 NOTE — Progress Notes (Signed)
Occupational Therapy Session Note  Patient Details  Name: Billy Anderson MRN: [id number] Date of Birth: 27-Apr-1965  Today's Date: 10/26/2021 OT Individual Time: 4097-3532 OT Individual Time Calculation (min): 75 min    Short Term Goals: Week 1:  OT Short Term Goal 1 (Week 1): Pt will complete full shower with supervision OT Short Term Goal 2 (Week 1): Pt will groom in standing for 2 tasks to dmeo imrpoved activity tolerance OT Short Term Goal 3 (Week 1): Pt will transfer to toilet with supervision  Skilled Therapeutic Interventions/Progress Updates:    Upon OT arrival, pt seated in recliner reporting no pain and is agreeable to OT treatment. Treatment intervention with a focus on self care retraining, standing balance, standing tolerance, functional transfers, UE strengthening and endurance. Pt completes sit to stand transfer with CGA using RW and ambulates with CGA to sink with RW. Pt stands at sink with CGA to complete oral care and brush hair. Pt completes stand to sit transfer into w/c with CGA and was transported down to ortho gym via w/c and total A for energy conservation. Pt completes stand pivot transfer to NUSTEP with CGA and completes NUSTEP using B UE/LE for 15 minutes at a steady pace. Pt requires one rest break for the UE while continuing to pedal with the LE. Pt completes stand pivot transfer from seat to w/c with CGA and was transported to main therapy gym via w/c and total A for time. Pt completes 5 sit<>stand transfers with CGA to replicate graphic design of colored plastic pegs using the R UE and maintains support with the L UE on tabletop.  Pt had 2 errors. Pt completes 5 more sit,>stand transfers with CGA to remove pegs one row at a time and returns them to the bucket. Pt noted to demonstrate greater difficulty pushing off from lower surface versus from w/c arm rests. Pt completes sit to stand transfer with CGA using RW and ambulates to his room with CGA and w/c follow for  safety. Pt completes stand to sit transfer with CGA into recliner. Pt doffs socks and shoes with SBA and donns TED hose with Min A. Pt donns socks and shoes with SBA and was left in recliner at end of session with all needs met. Pt limited primarily by decreased standing tolerance and endurance and continues to benefit from OT services to achieve highest level of independence.   Therapy Documentation Precautions:  Precautions Precautions: Fall, Back Precaution Comments: back for comfort Restrictions Weight Bearing Restrictions: No    ADL: ADL Eating: Set up Where Assessed-Eating: Chair Grooming: Contact guard Where Assessed-Grooming: Standing at sink    Therapy/Group: Individual Therapy  Marvetta Gibbons 10/26/2021, 7:55 AM

## 2021-10-27 DIAGNOSIS — F3110 Bipolar disorder, current episode manic without psychotic features, unspecified: Secondary | ICD-10-CM

## 2021-10-27 DIAGNOSIS — M48062 Spinal stenosis, lumbar region with neurogenic claudication: Secondary | ICD-10-CM | POA: Diagnosis not present

## 2021-10-27 DIAGNOSIS — E871 Hypo-osmolality and hyponatremia: Secondary | ICD-10-CM | POA: Diagnosis not present

## 2021-10-27 DIAGNOSIS — I480 Paroxysmal atrial fibrillation: Secondary | ICD-10-CM | POA: Diagnosis not present

## 2021-10-27 LAB — BASIC METABOLIC PANEL
Anion gap: 9 (ref 5–15)
BUN: 17 mg/dL (ref 6–20)
CO2: 28 mmol/L (ref 22–32)
Calcium: 9.4 mg/dL (ref 8.9–10.3)
Chloride: 101 mmol/L (ref 98–111)
Creatinine, Ser: 0.78 mg/dL (ref 0.61–1.24)
GFR, Estimated: >60 mL/min (ref >60)
Glucose, Bld: 99 mg/dL (ref 70–99)
Potassium: 4.8 mmol/L (ref 3.5–5.1)
Sodium: 138 mmol/L (ref 135–145)

## 2021-10-27 LAB — CBC
HCT: 34.6 % — ABNORMAL LOW (ref 39.0–52.0)
Hemoglobin: 12.3 g/dL — ABNORMAL LOW (ref 13.0–17.0)
MCH: 39.7 pg — ABNORMAL HIGH (ref 26.0–34.0)
MCHC: 35.5 g/dL (ref 30.0–36.0)
MCV: 111.6 fL — ABNORMAL HIGH (ref 80.0–100.0)
Platelets: 368 10*3/uL (ref 150–400)
RBC: 3.1 MIL/uL — ABNORMAL LOW (ref 4.22–5.81)
RDW: 13.2 % (ref 11.5–15.5)
WBC: 7 10*3/uL (ref 4.0–10.5)
nRBC: 0 % (ref 0.0–0.2)

## 2021-10-27 LAB — MAGNESIUM: Magnesium: 2 mg/dL (ref 1.7–2.4)

## 2021-10-27 NOTE — Progress Notes (Deleted)
PROGRESS NOTE   Subjective/Complaints: Pt sitting in bedside chair. Asks for senokot to be scheduled. No other concerns or complaints.   ROS: Patient denies fever, rash, sore throat, blurred vision, dizziness, nausea, vomiting, diarrhea, cough, shortness of breath or chest pain,   headache, or mood change.    Objective:   No results found. No results for input(s): "WBC", "HGB", "HCT", "PLT" in the last 72 hours.  No results for input(s): "NA", "K", "CL", "CO2", "GLUCOSE", "BUN", "CREATININE", "CALCIUM" in the last 72 hours.   Intake/Output Summary (Last 24 hours) at 10/27/2021 0050 Last data filed at 10/26/2021 2323 Gross per 24 hour  Intake 1432 ml  Output 2675 ml  Net -1243 ml      Pressure Injury 10/22/21 Buttocks Right;Medial Stage 2 -  Partial thickness loss of dermis presenting as a shallow open injury with a red, pink wound bed without slough. inner right cheek, open (Active)  10/22/21 1238  Location: Buttocks  Location Orientation: Right;Medial  Staging: Stage 2 -  Partial thickness loss of dermis presenting as a shallow open injury with a red, pink wound bed without slough.  Wound Description (Comments): inner right cheek, open  Present on Admission: Yes     Pressure Injury 10/22/21 Buttocks Right;Distal Stage 2 -  Partial thickness loss of dermis presenting as a shallow open injury with a red, pink wound bed without slough. outer right cheek, open (Active)  10/22/21 1238  Location: Buttocks  Location Orientation: Right;Distal  Staging: Stage 2 -  Partial thickness loss of dermis presenting as a shallow open injury with a red, pink wound bed without slough.  Wound Description (Comments): outer right cheek, open  Present on Admission: Yes    Physical Exam: Vital Signs Blood pressure 127/74, pulse 67, temperature 98.4 F (36.9 C), temperature source Oral, resp. rate 18, height 6\' 1"  (1.854 m), weight 80.9 kg,  SpO2 98 %.  Constitutional: No distress . Vital signs reviewed. HEENT: NCAT, conjugate gaze, oral membranes moist Neck: supple Cardiovascular: RRR without murmur. No JVD    Respiratory/Chest: CTA Bilaterally without wheezes or rales. Normal effort    GI/Abdomen: BS +, non-tender, non-distended Ext: no clubbing, cyanosis, or edema Psych: pleasant and cooperative  Skin: a few scattered abrasions on limbs. Below area is dressed.   Neuro:  Alert and oriented x 3. Follows commands. Normal insight and awareness. Intact Memory. Normal language and speech. Cranial nerve exam unremarkable. Motor: 5/5 UE, LE: 3+HF, KE and 4/5 ADF/PF. No obvious sensory findings. DTR's trace in LE's. No resting tone. Musculoskeletal: forward flexed lumbar posture when standing. Right leg may be a little stronger than right  .      Assessment/Plan: 1. Functional deficits which require 3+ hours per day of interdisciplinary therapy in a comprehensive inpatient rehab setting. Physiatrist is providing close team supervision and 24 hour management of active medical problems listed below. Physiatrist and rehab team continue to assess barriers to discharge/monitor patient progress toward functional and medical goals  Care Tool:  Bathing    Body parts bathed by patient: Right arm, Left arm, Chest, Abdomen, Front perineal area, Buttocks, Right upper leg, Left upper leg, Right lower leg, Left  lower leg, Face         Bathing assist Assist Level: Minimal Assistance - Patient > 75%     Upper Body Dressing/Undressing Upper body dressing   What is the patient wearing?: Pull over shirt    Upper body assist Assist Level: Set up assist    Lower Body Dressing/Undressing Lower body dressing      What is the patient wearing?: Pants     Lower body assist Assist for lower body dressing: Minimal Assistance - Patient > 75%     Toileting Toileting    Toileting assist Assist for toileting: Minimal Assistance - Patient  > 75%     Transfers Chair/bed transfer  Transfers assist     Chair/bed transfer assist level: Contact Guard/Touching assist     Locomotion Ambulation   Ambulation assist      Assist level: Contact Guard/Touching assist Assistive device: Walker-rolling Max distance: 294 ft   Walk 10 feet activity   Assist     Assist level: Contact Guard/Touching assist Assistive device: Walker-rolling   Walk 50 feet activity   Assist    Assist level: Contact Guard/Touching assist Assistive device: Walker-rolling    Walk 150 feet activity   Assist    Assist level: Contact Guard/Touching assist Assistive device: Walker-rolling    Walk 10 feet on uneven surface  activity   Assist     Assist level: Minimal Assistance - Patient > 75% Assistive device: Walker-rolling   Wheelchair     Assist Is the patient using a wheelchair?: Yes Type of Wheelchair: Manual    Wheelchair assist level: Supervision/Verbal cueing Max wheelchair distance: >150 ft    Wheelchair 50 feet with 2 turns activity    Assist        Assist Level: Supervision/Verbal cueing   Wheelchair 150 feet activity     Assist      Assist Level: Supervision/Verbal cueing   Blood pressure 127/74, pulse 67, temperature 98.4 F (36.9 C), temperature source Oral, resp. rate 18, height 6\' 1"  (1.854 m), weight 80.9 kg, SpO2 98 %.  Medical Problem List and Plan: 1. Functional deficits secondary to ambulatory dysfunction/lumbar spinal stenosis/close compression fracture L5 lumbar vertebra             -patient may shower             -ELOS/Goals: 12-14 days, PT/OT supervision goals            -Continue CIR therapies including PT, OT  2.  Antithrombotics: -DVT/anticoagulation:  Mechanical: Antiembolism stockings, thigh (TED hose) Bilateral lower extremities             -antiplatelet therapy: N/A 3. Pain Management/chronic back pain: Neurontin 300 mg twice daily 4. Anxiety/bipolar  disorder/Sleep: Melatonin 10 mg nightly as needed.             -antipsychotic agents: Zyprexa 20 mg nightly, Lamictal 200 mg nightly  -very appropriate behavior thus far 5. Neuropsych/cognition: This patient is capable of making decisions on his own behalf. 6. Skin/Wound Care: foam dressing to buttock wounds  -pressure relief, nutrition  7. Fluids/Electrolytes/Nutrition: encourage PO  -albumin low--add protein supp  -sodium sl decreased to 133--near current baseline   -continue salt tabs   -  eating well presently   -recheck bmet on Monday  8.  PAF.  Not on anticoagulation prior to admission.  Cardiac rate controlled.  Cardizem 360 mg daily.  Aspirin currently on hold.  HR controlled 9.  Alcohol/tobacco abuse.  Provide counseling.  Off CIWA, Continue thiamine as well as folic acid.  Patient on Depade 50 mg daily prior to admission.  NicoDerm patch ongoing 10.  Transaminitis/hyperbilirubinemia.  Pattern consistent with alcohol.  CK normalized. 11.  Hypertension.  Continue Cardizem.  Lisinopril held due to hyponatremia.    -10/7 well controlled, continue cardizem 12.  Elevated AST.  CK within normal limits.  Likely from alcohol.    10/5 WNL 13.  Chronic thrombocytopenia:  Likely due to alcohol abuse.      -platelets up to 234k 10/5--recheck monday 14.  Positive Hemoccult/chronic diarrhea.  H&H stable.  No overt bleeding.  Outpatient follow-up GI. Diarrhea has improved.    Continue PPI  10/5 hgb up to 12.6. follow up on Monday   10/7 last BM today, Schedule Senokot to prevent constipation 15.  GERD.  Continue Protonix 16.  History of seizure disorder.  Continue Lamictal 17. Hypomagnesemia. Low nl: 1.8 -Continue Mag ox -Recheck monday    LOS: 5 days A FACE TO FACE EVALUATION WAS PERFORMED  Billy Anderson 10/27/2021, 12:50 AM

## 2021-10-27 NOTE — Progress Notes (Signed)
Physical Therapy Session Note  Patient Details  Name: Nizar Cutler MRN: [id number] Date of Birth: 1965/12/23  Today's Date: 10/27/2021 PT Individual Time: 1000-1100 PT Individual Time Calculation (min): 60 min   Short Term Goals: Week 1:  PT Short Term Goal 1 (Week 1): Patient will perform basic transfers with supervision consistently using LRAD. PT Short Term Goal 2 (Week 1): Patient will improve score on Berg Balance Scale by 7 points to meet MCID for reduced fall risk. PT Short Term Goal 3 (Week 1): Patient will ambulate with supervision >400 feet using LRAD.  Skilled Therapeutic Interventions/Progress Updates:     Patient in recliner in the room upon PT arrival. Patient alert and agreeable to PT session. Patient denied pain during session, reports increased fatigue in his leg today during NMR activities, see below.  Therapeutic Activity: Transfers: Patient performed sit to/from stand x3 with supervision using rollator. Provided verbal cues for using of breaks before standing or sitting for safety. Patient reports he does not typically use the breaks at home. Educated on safety benefits of use of breaks to prevent falls, will need reinforcement.  Patient performed a simulated driver's side truck height car transfer with supervision using rollator and car frame. Provided cues for safe technique, turning to sit before bringing leg in vs step in technique.  Gait Training:  Patient ambulated >150 feet and >250 feet using rollator with supervision. Ambulated with decreased gait speed, decreased step length and height, forward trunk lean, and downward head gaze. Provided verbal cues for erect posture with shoulder retraction, looking up and visually scanning his environment, and increased step height.  Neuromuscular Re-ed: Patient performed the following functional motor control activities: -prep for floor transfers mat table<>floor mat progressing to L kneeling and returning to sitting on  mat table with mod A x3 -sit to stand with hands on thighs and elevated mat height x2 with mod A and x3 using upper extremities with CGA, required 1-2 to attempts before shifting weight forward far enough to boost up, patient reports increased LE fatigue with activity today -standing at BITS, performed level 1 of Reaction time and visual scanning assessment with 24 hits in 1 min, required CGA-min A for standing balance and L hand assist for R reach due to hx of shoulder subluxation; performed in sitting with 35 hits in 1 min, but continue limitation with R arm ROM  Therapeutic Exercise: Patient ambulated 8 min and 19 sec, 921 feet and 0.6 m/s, for cardiovascular exercise, focused on increased gait speed and monitored with use of modified BORG RPE throughout, required rest break with RPE 8/10  Patient in recliner in the room at end of session with breaks locked, chair alarm set, and all needs within reach.   Therapy Documentation Precautions:  Precautions Precautions: Fall, Back Precaution Comments: back for comfort Restrictions Weight Bearing Restrictions: No    Therapy/Group: Individual Therapy  Lavonne Cass L Deshone Lyssy PT, DPT, NCS, CBIS  10/27/2021, 12:55 PM
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
## 2021-10-27 NOTE — Progress Notes (Signed)
Occupational Therapy Session Note  Patient Details  Name: Billy Anderson MRN: [id number] Date of Birth: April 09, 1965  Today's Date: 10/27/2021 OT Individual Time: 0109-3235 & 1345-1415 OT Individual Time Calculation (min): 75 min & 30 min   Short Term Goals: Week 1:  OT Short Term Goal 1 (Week 1): Pt will complete full shower with supervision OT Short Term Goal 2 (Week 1): Pt will groom in standing for 2 tasks to dmeo imrpoved activity tolerance OT Short Term Goal 3 (Week 1): Pt will transfer to toilet with supervision  Skilled Therapeutic Interventions/Progress Updates:  Session 1:   Upon OT arrival, pt seated in recliner reporting no pain and is agreeable to OT treatment session. Treatment intervention with a focus on self care retraining, endurance, and UE strengthening. Pt completes full shower ADL at the levels below. Pt performs functional mobility and transfers with CGA using RW. While seated in w/c pt completes B UE exercises for 2x10 reps including shoulder flex/ext, shoulder abd/add, chest press, elbow flex/ext, supin/pront, wrist ext/flex using 3lb dumbbell. Pt unable to use dumbbell for shoulder flex/ext and shoulder abd/add with the R UE secondary to prior rotator cuff injury. Pt tolerates exercises well and rest breaks required. Pt returns to recliner with CGA using SPT at end of session with all needs met and safety measures in place.   Session 2: Upon OT arrival, pt seated in recliner reporting no pain and is agreeable to OT treatment. Treatment intervention with a focus on functional mobility, stand tolerance, functional transfers from lower surfaces and endurance. Pt completes sit to stand transfer with CGA and ambulates to dayroom gym with 4WW and CGA. Pt completes stand to sit transfer with CGA. Pt performs multiple sit<>stand transfers with CGA from lower surface chair and stands to flip over playing cards and sort based on suit and in numerical order. Rest breaks required  secondary to fatigue and back discomfort. Pt tolerates task well. Pt completes sit to stand transfer with CGA and ambulates with 4WW back to his room with CGA and returns to seated position in recliner with CGA. Pt requires occasional verbal cues to perform controlled stand to sit transfers for safety. Pt was left in the recliner at end of session with all needs met and safety measures in place.   Therapy Documentation Precautions:  Precautions Precautions: Fall, Back Precaution Comments: back for comfort Restrictions Weight Bearing Restrictions: No   ADL: ADL Eating: Set up Where Assessed-Eating: Chair Grooming: Supervision/safety Where Assessed-Grooming: Sitting at sink Upper Body Bathing: Supervision/safety Where Assessed-Upper Body Bathing: Shower Lower Body Bathing: Contact guard Where Assessed-Lower Body Bathing: Shower Upper Body Dressing: Contact guard Where Assessed-Upper Body Dressing: Other (Comment) (standing with RW to doff) Lower Body Dressing: Minimal assistance (manage TED hose) Where Assessed-Lower Body Dressing: Wheelchair Toileting: Unable to assess (pt did not need to go) Social research officer, government: Curator Method: Heritage manager: Other (comment) (padded toilet seat)  Therapy/Group: Individual Therapy  [name] 10/27/2021, 8:10 AM

## 2021-10-27 NOTE — Progress Notes (Signed)
PROGRESS NOTE   Subjective/Complaints: Pt up in bed. No new issues. Pain controlled. Asked about driving again. Has not driven since seizures last year. Last seizure was in October and has been on lamictal  ROS: Patient denies fever, rash, sore throat, blurred vision, dizziness, nausea, vomiting, diarrhea, cough, shortness of breath or chest pain, joint or back/neck pain, headache, or mood change.    Objective:   No results found. Recent Labs    10/27/21 0557  WBC 7.0  HGB 12.3*  HCT 34.6*  PLT 368   Recent Labs    10/27/21 0557  NA 138  K 4.8  CL 101  CO2 28  GLUCOSE 99  BUN 17  CREATININE 0.78  CALCIUM 9.4    Intake/Output Summary (Last 24 hours) at 10/27/2021 1005 Last data filed at 10/27/2021 0702 Gross per 24 hour  Intake 1781 ml  Output 2775 ml  Net -994 ml     Pressure Injury 10/22/21 Buttocks Right;Medial Stage 2 -  Partial thickness loss of dermis presenting as a shallow open injury with a red, pink wound bed without slough. inner right cheek, open (Active)  10/22/21 1238  Location: Buttocks  Location Orientation: Right;Medial  Staging: Stage 2 -  Partial thickness loss of dermis presenting as a shallow open injury with a red, pink wound bed without slough.  Wound Description (Comments): inner right cheek, open  Present on Admission: Yes     Pressure Injury 10/22/21 Buttocks Right;Distal Stage 2 -  Partial thickness loss of dermis presenting as a shallow open injury with a red, pink wound bed without slough. outer right cheek, open (Active)  10/22/21 1238  Location: Buttocks  Location Orientation: Right;Distal  Staging: Stage 2 -  Partial thickness loss of dermis presenting as a shallow open injury with a red, pink wound bed without slough.  Wound Description (Comments): outer right cheek, open  Present on Admission: Yes    Physical Exam: Vital Signs Blood pressure (!) 142/78, pulse 66,  temperature 98 F (36.7 C), temperature source Oral, resp. rate 18, height 6\' 1"  (1.854 m), weight 80.9 kg, SpO2 97 %.  Constitutional: No distress . Vital signs reviewed. HEENT: NCAT, conjugate gaze, oral membranes moist Neck: supple Cardiovascular: RRR without murmur. No JVD    Respiratory/Chest: CTA Bilaterally without wheezes or rales. Normal effort    GI/Abdomen: BS +, non-tender, non-distended Ext: no clubbing, cyanosis, or edema Psych: pleasant and cooperative  Skin: a few scattered abrasions on limbs. Foam dressings to stage II buttock wounds Neuro:  Alert and oriented x 3. Follows commands. Normal insight and awareness. Intact Memory. Normal language and speech. Cranial nerve exam unremarkable. Motor: 5/5 UE, LE: 3+HF, KE and 4/5 ADF/PF. No obvious sensory findings. DTR's trace in LE's. No resting tone. Musculoskeletal: forward flexed lumbar posture when standing. Right leg may be a little stronger than right  .      Assessment/Plan: 1. Functional deficits which require 3+ hours per day of interdisciplinary therapy in a comprehensive inpatient rehab setting. Physiatrist is providing close team supervision and 24 hour management of active medical problems listed below. Physiatrist and rehab team continue to assess barriers to discharge/monitor patient progress  toward functional and medical goals  Care Tool:  Bathing    Body parts bathed by patient: Right arm, Left arm, Chest, Abdomen, Front perineal area, Buttocks, Right upper leg, Left upper leg, Right lower leg, Left lower leg, Face         Bathing assist Assist Level: Contact Guard/Touching assist     Upper Body Dressing/Undressing Upper body dressing   What is the patient wearing?: Pull over shirt    Upper body assist Assist Level: Contact Guard/Touching assist    Lower Body Dressing/Undressing Lower body dressing      What is the patient wearing?: Pants     Lower body assist Assist for lower body dressing:  Contact Guard/Touching assist     Toileting Toileting    Toileting assist Assist for toileting: Minimal Assistance - Patient > 75%     Transfers Chair/bed transfer  Transfers assist     Chair/bed transfer assist level: Contact Guard/Touching assist     Locomotion Ambulation   Ambulation assist      Assist level: Contact Guard/Touching assist Assistive device: Walker-rolling Max distance: 294 ft   Walk 10 feet activity   Assist     Assist level: Contact Guard/Touching assist Assistive device: Walker-rolling   Walk 50 feet activity   Assist    Assist level: Contact Guard/Touching assist Assistive device: Walker-rolling    Walk 150 feet activity   Assist    Assist level: Contact Guard/Touching assist Assistive device: Walker-rolling    Walk 10 feet on uneven surface  activity   Assist     Assist level: Minimal Assistance - Patient > 75% Assistive device: Chemical engineer     Assist Is the patient using a wheelchair?: Yes Type of Wheelchair: Manual    Wheelchair assist level: Supervision/Verbal cueing Max wheelchair distance: >150 ft    Wheelchair 50 feet with 2 turns activity    Assist        Assist Level: Supervision/Verbal cueing   Wheelchair 150 feet activity     Assist      Assist Level: Supervision/Verbal cueing   Blood pressure (!) 142/78, pulse 66, temperature 98 F (36.7 C), temperature source Oral, resp. rate 18, height 6\' 1"  (1.854 m), weight 80.9 kg, SpO2 97 %.  Medical Problem List and Plan: 1. Functional deficits secondary to ambulatory dysfunction/lumbar spinal stenosis/close compression fracture L5 lumbar vertebra             -patient may shower             -ELOS/Goals: 12-14 days, PT/OT supervision goals            -Continue CIR therapies including PT, OT  2.  Antithrombotics: -DVT/anticoagulation:  Mechanical: Antiembolism stockings, thigh (TED hose) Bilateral lower extremities              -antiplatelet therapy: N/A 3. Pain Management/chronic back pain: Neurontin 300 mg twice daily 4. Anxiety/bipolar disorder/Sleep: Melatonin 10 mg nightly as needed.             -antipsychotic agents: Zyprexa 20 mg nightly, Lamictal 200 mg nightly  -affect normal 5. Neuropsych/cognition: This patient is capable of making decisions on his own behalf. 6. Skin/Wound Care: foam dressing to buttock wounds  -pressure relief, nutrition  7. Fluids/Electrolytes/Nutrition: encourage PO  -albumin low--add protein supp  -sodium up to 138 10/9   -dc salt tabs 8.  PAF.  Not on anticoagulation prior to admission.  Cardiac rate controlled.  Cardizem 360 mg daily.  Aspirin currently on hold.  HR controlled  9.  Alcohol/tobacco abuse.  Provide counseling.  Off CIWA, Continue thiamine as well as folic acid.  Patient on Depade 50 mg daily prior to admission.  NicoDerm patch ongoing 10.  Transaminitis/hyperbilirubinemia.  Pattern consistent with alcohol.  CK normalized. 11.  Hypertension.  Continue Cardizem.  Lisinopril held due to hyponatremia.    -10/9 well controlled, continue cardizem 12.  Elevated AST.  CK within normal limits.  Likely from alcohol.    10/5 WNL 13.  Chronic thrombocytopenia:  Likely due to alcohol abuse.      -platelets up to 368 14.  Positive Hemoccult/chronic diarrhea.  H&H stable.  No overt bleeding.  Outpatient follow-up GI. Diarrhea has improved.    Continue PPI  10/5 hgb up to 12.6. follow up on Monday   10/7 last BM   Scheduled Senokot to prevent constipation 15.  GERD.  Continue Protonix 16.  History of seizure disorder.  Continue Lamictal 17. Hypomagnesemia. 2.0 10/9 -Continue Mag ox 18. Seizure disorder:  -pt has not had a seizure since 10/22  -he remains on lamictal as prescribed  -discussed the importance of etoh abstinence  -he may return to driving      LOS: 5 days A FACE TO [place] 10/27/2021, 10:05 AM

## 2021-10-27 NOTE — Progress Notes (Signed)
Occupational Therapy Session Note  Patient Details  Name: Billy Anderson MRN: [id number] Date of Birth: 04-12-1965  Today's Date: 10/27/2021 OT Individual Time: 1135-1203 OT Individual Time Calculation (min): 28 min    Short Term Goals: Week 1:  OT Short Term Goal 1 (Week 1): Pt will complete full shower with supervision OT Short Term Goal 2 (Week 1): Pt will groom in standing for 2 tasks to dmeo imrpoved activity tolerance OT Short Term Goal 3 (Week 1): Pt will transfer to toilet with supervision  Skilled Therapeutic Interventions/Progress Updates:    Pt received sitting with no c/o pain, agreeable to OT session. He used the urinal seated with set up assist. He stood from the recliner with close (S). (S) for 100 ft of functional mobility with the rollator to the therapy gym. He requested to begin session with warm up on the NuStep. He completed 10 min on level 5 with maintenance of 40-45 steps per minute- performed as a preparatory method for BUE/BLE and cardiovascular system. Pt demonstrating improved endurance overall with activity. He then completed activity focused on dynamic standing balance- performing reciprocal tapping onto a 2 in step with CGA- min A. He returned to his room via 125 ft of functional mobility using the RW with (S). Pt was left sitting up in the recliner with all needs met, chair alarm set, and call bell within reach.     Therapy Documentation Precautions:  Precautions Precautions: Fall, Back Precaution Comments: back for comfort Restrictions Weight Bearing Restrictions: No  Therapy/Group: Individual Therapy  Curtis Sites 10/27/2021, 11:52 AM

## 2021-10-28 NOTE — Progress Notes (Signed)
Physical Therapy Session Note  Patient Details  Name: Billy Anderson MRN: [id number] Date of Birth: 1965-10-08  Today's Date: 10/28/2021 PT Individual Time: 0828-0900 PT Individual Time Calculation (min): 32 min   Short Term Goals: Week 1:  PT Short Term Goal 1 (Week 1): Patient will perform basic transfers with supervision consistently using LRAD. PT Short Term Goal 2 (Week 1): Patient will improve score on Berg Balance Scale by 7 points to meet MCID for reduced fall risk. PT Short Term Goal 3 (Week 1): Patient will ambulate with supervision >400 feet using LRAD.  Skilled Therapeutic Interventions/Progress Updates:      Therapy Documentation Precautions:  Precautions Precautions: Fall, Back Precaution Comments: back for comfort Restrictions Weight Bearing Restrictions: No  Pt received seated in recliner at bedside, agreeable to PT session with emphasis on transfer training without UE support. Pt with unsuccessful attempt of sit to stand with no UE support and required UE for transfer from low recliner. Pt ambulated ~150 ft to dayroom with (S) and participated in blocked practice of 7 sit to stand from elevated mat with no UE support. Pt requires tactile and verbal cueing for foot placement and anterior trunk lean with transfer. Pt declines pain throughout session and reports right hamstring tightness. Pt performed seated hamstring stretch 2 x 30 seconds bilaterally to improve flexibility.  Pt ambulated to room and left seated in recliner with alarm on and all needs within reach.    Therapy/Group: Individual Therapy  Verl Dicker Verl Dicker PT, DPT  10/28/2021, 8:27 AM

## 2021-10-28 NOTE — Progress Notes (Signed)
Occupational Therapy Session Note  Patient Details  Name: Araf Clugston MRN: [id number] Date of Birth: September 25, 1965  Today's Date: 10/28/2021 OT Individual Time: 0707-0801 OT Individual Time Calculation (min): 54 min   Today's Date: 10/28/2021 OT Individual Time: 1030-1100 OT Individual Time Calculation (min): 30 min   Short Term Goals: Week 1:  OT Short Term Goal 1 (Week 1): Pt will complete full shower with supervision OT Short Term Goal 2 (Week 1): Pt will groom in standing for 2 tasks to dmeo imrpoved activity tolerance OT Short Term Goal 3 (Week 1): Pt will transfer to toilet with supervision  Skilled Therapeutic Interventions/Progress Updates:    Session 1: Pt received in recliner having just gotten breakfast with no pain. Pt with no ADL needs at this time agreeable to working on balance and functional mobility in dayroom/apartment.   Therapeutic exercise 7 min Nustep per pt request on level 6 with STM >40 to improve BLE strengthening and cardivascular endurance   Therapeutic activity Pt finishing up breakfast and OT sets up activity in tx gym and prints out handout while eating. Education provided on energy conservation as well as adaptations of ADLs and IADLs.   Use of quad cane V Rolator for obstacle course- weaving in cones, picking up item off floor with reacher and stepping over small object with pt completing 2x with rollator and only 1x with cane prior to needing rest break. Discussed energy expenditure  Sit to stand from low couch to simulate home 3x with supervision and cuing for more forward weight shift  Pt left at end of session in recliner with exit alarm on, call light in reach and all needs met  Session 2: Pt received in bed with no pain. Therapeutic exercise Functional mobility to/from front of hospital >500' per direction for actiivyt tolerance, community mobility distances, walking over uneven surfaces with Rolator, management of Rolator to rest seated,  and pathfinding back to room. Pt completes all mobility with supervision with cuing for technique of seated rest break on Rolator during trip to courtyard but pt able to recall on way back. Pt educated on leaning rest breaks on wall as needed if didn't have a seat.  Pt left at end of session in recliner with exit alarm on, call light in reach and all needs met   Therapy Documentation Precautions:  Precautions Precautions: Fall, Back Precaution Comments: back for comfort Restrictions Weight Bearing Restrictions: No General:     Therapy/Group: Individual Therapy  Tonny Branch 10/28/2021, 6:51 AM
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
## 2021-10-28 NOTE — Progress Notes (Signed)
Physical Therapy Session Note  Patient Details  Name: Billy Anderson MRN: [id number] Date of Birth: 09/22/1965  Today's Date: 10/28/2021 PT Individual Time: 1300-1400 PT Individual Time Calculation (min): 60 min   Short Term Goals: Week 1:  PT Short Term Goal 1 (Week 1): Patient will perform basic transfers with supervision consistently using LRAD. PT Short Term Goal 2 (Week 1): Patient will improve score on Berg Balance Scale by 7 points to meet MCID for reduced fall risk. PT Short Term Goal 3 (Week 1): Patient will ambulate with supervision >400 feet using LRAD.  Skilled Therapeutic Interventions/Progress Updates:     Patient in recliner in the room upon PT arrival. Patient alert and agreeable to PT session. Patient denied pain during session.  Asked patient about his biggest concerns at d/c, patient asked to focus on couch transfers for improved independence and success with standing from low surfaces, stairs for increased independence with home entry, and the NuStep for leg strengthening.   Educated on d/c recommendations to initially have his wife with him when leaving the house, driving, and ambulating in the community for safety. Patient in agreement with this. Discussed alcohol use at d/c, recommended abstinence from any alcohol consumption and educated on increased fall risk with substance use. Patient stated understanding and stated he would be able to abstain from alcohol at d/c. Will reinforce with patient and his wife during family education.   Therapeutic Activity: Transfers: Patient performed sit to/from stand with supervision-mod I using a rollator throughout session. Performed blocked practice sit to stand from ADL couch to simulate home seating with supervision. Provided verbal cues for scooting forward, use of arm rest to push up, and forward weight shift. Educated on use of a folded quilt or added cushion to elevated couch seat height at home for improved safety and  independence with furniture transfers, patient in agreement.   Gait Training:  Patient ambulated 100-150 feet x4 using rollator with supervision. Ambulated with decreased gait speed, decreased step length and height, forward trunk lean, and downward head gaze. Provided verbal cues for erect posture, increased step height, especially with changes in floor surfaces (tile<>carpet), and increased visual scanning for safety.  Therapeutic Exercise: Patient performed the following exercises with verbal and tactile cues for proper technique. -NuStep x8 min on work load 6 using B upper and lower extremities and x7 min on work load 5 using only lower extremities to focus on lower extremity strengthening; cued patient to maintain SPM >40 throughout for increased cardiovascular endurance, RPE 7/10 after  Patient in recliner in the room at end of session with breaks locked, chair alarm set, and all needs within reach. Discussed plan for assessing in room mobility at mod I level using the rollator tomorrow to simulate d/c home mobility for his last few days prior to d/c, patient in agreement.   Therapy Documentation Precautions:  Precautions Precautions: Fall, Back Precaution Comments: back for comfort Restrictions Weight Bearing Restrictions: No    Therapy/Group: Individual Therapy  Markee Matera L Soleia Badolato PT, DPT, NCS, CBIS  10/28/2021, 9:03 PM

## 2021-10-28 NOTE — Progress Notes (Signed)
PROGRESS NOTE   Subjective/Complaints: No new concerns. Pain better. Progressing with therapy  ROS: Patient denies fever, rash, sore throat, blurred vision, dizziness, nausea, vomiting, diarrhea, cough, shortness of breath or chest pain,   headache, or mood change.    Objective:   No results found. Recent Labs    10/27/21 0557  WBC 7.0  HGB 12.3*  HCT 34.6*  PLT 368   Recent Labs    10/27/21 0557  NA 138  K 4.8  CL 101  CO2 28  GLUCOSE 99  BUN 17  CREATININE 0.78  CALCIUM 9.4    Intake/Output Summary (Last 24 hours) at 10/28/2021 0900 Last data filed at 10/28/2021 0735 Gross per 24 hour  Intake 714 ml  Output 1200 ml  Net -486 ml     Pressure Injury 10/22/21 Buttocks Right;Medial Stage 2 -  Partial thickness loss of dermis presenting as a shallow open injury with a red, pink wound bed without slough. inner right cheek, open (Active)  10/22/21 1238  Location: Buttocks  Location Orientation: Right;Medial  Staging: Stage 2 -  Partial thickness loss of dermis presenting as a shallow open injury with a red, pink wound bed without slough.  Wound Description (Comments): inner right cheek, open  Present on Admission: Yes     Pressure Injury 10/22/21 Buttocks Right;Distal Stage 2 -  Partial thickness loss of dermis presenting as a shallow open injury with a red, pink wound bed without slough. outer right cheek, open (Active)  10/22/21 1238  Location: Buttocks  Location Orientation: Right;Distal  Staging: Stage 2 -  Partial thickness loss of dermis presenting as a shallow open injury with a red, pink wound bed without slough.  Wound Description (Comments): outer right cheek, open  Present on Admission: Yes    Physical Exam: Vital Signs Blood pressure (!) 127/116, pulse 85, temperature 97.6 F (36.4 C), resp. rate 20, height 6\' 1"  (1.854 m), weight 80.9 kg, SpO2 98 %.  Constitutional: No distress . Vital  signs reviewed. HEENT: NCAT, EOMI, oral membranes moist Neck: supple Cardiovascular: RRR without murmur. No JVD    Respiratory/Chest: CTA Bilaterally without wheezes or rales. Normal effort    GI/Abdomen: BS +, non-tender, non-distended Ext: no clubbing, cyanosis, or edema Psych: pleasant and cooperative  Skin: buttock wounds with small superficial scabs, peeling off. Essentially resolved.  Neuro:  Alert and oriented x 3. Follows commands. Normal insight and awareness. Intact Memory. Normal language and speech. Cranial nerve exam unremarkable. Motor: 5/5 UE (except right shoulder), LE: 4HF, KE and 4+/5 ADF/PF. No obvious sensory findings. DTR's trace in LE's. No resting tone. Musculoskeletal:  . Unable to perform initial 20-30 deg shoulder abduction on right. .      Assessment/Plan: 1. Functional deficits which require 3+ hours per day of interdisciplinary therapy in a comprehensive inpatient rehab setting. Physiatrist is providing close team supervision and 24 hour management of active medical problems listed below. Physiatrist and rehab team continue to assess barriers to discharge/monitor patient progress toward functional and medical goals  Care Tool:  Bathing    Body parts bathed by patient: Right arm, Left arm, Chest, Abdomen, Front perineal area, Buttocks, Right upper leg,  Left upper leg, Right lower leg, Left lower leg, Face         Bathing assist Assist Level: Contact Guard/Touching assist     Upper Body Dressing/Undressing Upper body dressing   What is the patient wearing?: Pull over shirt    Upper body assist Assist Level: Contact Guard/Touching assist    Lower Body Dressing/Undressing Lower body dressing      What is the patient wearing?: Pants     Lower body assist Assist for lower body dressing: Contact Guard/Touching assist     Toileting Toileting    Toileting assist Assist for toileting: Independent with assistive device Assistive Device Comment:  walker   Transfers Chair/bed transfer  Transfers assist     Chair/bed transfer assist level: Contact Guard/Touching assist     Locomotion Ambulation   Ambulation assist      Assist level: Contact Guard/Touching assist Assistive device: Walker-rolling Max distance: 294 ft   Walk 10 feet activity   Assist     Assist level: Contact Guard/Touching assist Assistive device: Walker-rolling   Walk 50 feet activity   Assist    Assist level: Contact Guard/Touching assist Assistive device: Walker-rolling    Walk 150 feet activity   Assist    Assist level: Contact Guard/Touching assist Assistive device: Walker-rolling    Walk 10 feet on uneven surface  activity   Assist     Assist level: Minimal Assistance - Patient > 75% Assistive device: Walker-rolling   Wheelchair     Assist Is the patient using a wheelchair?: Yes Type of Wheelchair: Manual    Wheelchair assist level: Supervision/Verbal cueing Max wheelchair distance: >150 ft    Wheelchair 50 feet with 2 turns activity    Assist        Assist Level: Supervision/Verbal cueing   Wheelchair 150 feet activity     Assist      Assist Level: Supervision/Verbal cueing   Blood pressure (!) 127/116, pulse 85, temperature 97.6 F (36.4 C), resp. rate 20, height 6\' 1"  (1.854 m), weight 80.9 kg, SpO2 98 %.  Medical Problem List and Plan: 1. Functional deficits secondary to ambulatory dysfunction/lumbar spinal stenosis/close compression fracture L5 lumbar vertebra             -patient may shower             -ELOS/Goals: 12-14 days, PT/OT supervision goals            -Continue CIR therapies including PT and OT. Interdisciplinary team conference today to discuss goals, barriers to discharge, and dc planning.   2.  Antithrombotics: -DVT/anticoagulation:  Mechanical: Antiembolism stockings, thigh (TED hose) Bilateral lower extremities             -antiplatelet therapy: N/A 3. Pain  Management/chronic back pain: Neurontin 300 mg twice daily 4. Anxiety/bipolar disorder/Sleep: Melatonin 10 mg nightly as needed.             -antipsychotic agents: Zyprexa 20 mg nightly, Lamictal 200 mg nightly  -affect normal 5. Neuropsych/cognition: This patient is capable of making decisions on his own behalf. 6. Skin/Wound Care: buttock wounds essentially healed--can dc foam dressings  -pressure relief, nutrition  7. Fluids/Electrolytes/Nutrition: encourage PO  -albumin low--add protein supp  -sodium up to 138 10/9   -dc'ed salt tabs 8.  PAF.  Not on anticoagulation prior to admission.  Cardiac rate controlled.  Cardizem 360 mg daily.  Aspirin currently on hold.  HR controlled  9.  Alcohol/tobacco abuse.  Provide counseling.  Off  CIWA, Continue thiamine as well as folic acid.  Patient on Depade 50 mg daily prior to admission.  NicoDerm patch ongoing 10.  Transaminitis/hyperbilirubinemia.  Pattern consistent with alcohol.  CK normalized. 11.  Hypertension.  Continue Cardizem.  Lisinopril held due to hyponatremia.    -10/10--DBP elevated today--question accuracy--has been controlled 12.  Elevated AST.  CK within normal limits.  Likely from alcohol.    10/5 WNL 13.  Chronic thrombocytopenia:  Likely due to alcohol abuse.      -platelets up to 368 14.  Positive Hemoccult/chronic diarrhea.  H&H stable.  No overt bleeding.  Outpatient follow-up GI. Diarrhea has improved.    Continue PPI  10/5 hgb up to 12.6. follow up on Monday   10/7 last BM   Scheduled Senokot to prevent constipation 15.  GERD.  Continue Protonix 16.  History of seizure disorder.  Continue Lamictal 17. Hypomagnesemia. 2.0 10/9 -Continue Mag ox 18. Seizure disorder:  -pt has not had a seizure since 10/22  -he remains on lamictal as prescribed  -re-emphasized the importance of etoh abstinence  -he may return to driving 19. Right shoulder weakness  -states he might have injured it about a year ago  -appears to be  right supraspinatus injury, likely complete tear  -consider MRI as outpt. Surgical options are probably pretty limited at this point      LOS: 6 days A FACE TO [place] 10/28/2021, 9:00 AM

## 2021-10-28 NOTE — Patient Care Conference (Signed)
Inpatient RehabilitationTeam Conference and Plan of Care Update Date: 10/28/2021   Time: 10:22 AM   Patient Name: Billy Anderson      Medical Record Number: [id number]  Date of Birth: Aug 11, 1965 Sex: Male         Room/Bed: 4W23C/4W23C-01 Payor Info: Payor: MEDICAID [place] / Plan: MEDICAID [place] ACCESS / Product Type: *No Product type* /    Admit Date/Time:  10/22/2021 12:25 PM  Primary Diagnosis:  Lumbar spinal stenosis  Hospital Problems: Principal Problem:   Lumbar spinal stenosis Active Problems:   Bipolar I disorder, most recent episode (or current) manic ([place])   Pressure injury of skin    Expected Discharge Date: Expected Discharge Date: 11/01/21  Team Members Present: Physician leading conference: Dr. Alger Simons Social Worker Present: Loralee Pacas, [place] Nurse Present: Other (comment) Tacy Learn, RN) PT Present: Apolinar Junes, PT OT Present: Mariane Masters, OT PPS Coordinator present : Gunnar Fusi, SLP     Current Status/Progress Goal Weekly Team Focus  Bowel/Bladder   cont of BxB. last BM 10/9 per Patient  remain cont.  assess q shift and PRN   Swallow/Nutrition/ Hydration             ADL's   Supervision for BADL and transfers  S bathing/transfer and MOD I  funcitonal transfers, community mobility, family education, and ADL retraining   Mobility   Supervision overall, intermittent min A during transfers, gait >900 ft with rollator, CGA-min A 8 steps, significant hx of falls PTA, poor postural control and LE weakness proximal>distal  Mod I household mobility, supervision community  functional mobility, strengthening, balance, activity tolerance, gait and stair training, fall prevention and recovery, safety awareness, patient/caregiver education   Communication             Safety/Cognition/ Behavioral Observations            Pain   c/o chronic pain to Lower back 4of 10  pain<3  assess pain q shift and PRN   Skin   PU st 2 to R  buttocks  proper healing  assess skin q shift and PRN     Discharge Planning:  Pt likely to d/c to home with his wife and step dtr. SW will confirm there are no barriers to discharge.   Team Discussion: Lumbar spinal stenosis. Continent B/B. Pain improved. Stage II to buttocks healing.  Therapies progressing towards goals. Outing planned for Friday.   Patient on target to meet rehab goals: yes, ambulating with rollater and 8 ste CGA-min  *See Care Plan and progress notes for long and short-term goals.   Revisions to Treatment Plan:  Monitor labs, schedule family education  Teaching Needs: Medications, safety, gait/transfer training, skin care, etc.  Current Barriers to Discharge: Decreased caregiver support and Wound care  Possible Resolutions to Barriers: Family education, order recommended DME     Medical Summary Current Status: pain improved. sodium normal. right shoulder old rtc injury.  Barriers to Discharge: Medical stability   Possible Resolutions to Barriers/Weekly Focus: daily assessment of labs and pain, clinical status   Continued Need for Acute Rehabilitation Level of Care: The patient requires daily medical management by a physician with specialized training in physical medicine and rehabilitation for the following reasons: Direction of a multidisciplinary physical rehabilitation program to maximize functional independence : Yes Medical management of patient stability for increased activity during participation in an intensive rehabilitation regime.: Yes Analysis of laboratory values and/or radiology reports with any subsequent need for medication adjustment and/or  medical intervention. : Yes   I attest that I was present, lead the team conference, and concur with the assessment and plan of the team.   Jearld Adjutant 10/28/2021, 1:26 PM

## 2021-10-28 NOTE — Progress Notes (Signed)
Patient ID: Billy Anderson, male   DOB: [birth year], 56 y.o.   MRN: [id number]  SW made contact with pt wife Chrisitie to provide udpates from team conference, and d/c date 10/14. Fam edu scheduled for Thursday 1pm-3pm. Would like for PCP appointment to be scheduled for pt. States she prefers [facility] at [facility]. SW to order 3in1 BSC.  SW ordered DME with [facility] via parach[facility].   SW met with pt in room to inform on above. No questions/concerns reported at this time.   *SW called [place] [facility] and they are not accepting any Medicaid patients at this time. SW will discuss with pt and pt wife finding a provider that accepts insurance.   Loralee Pacas, MSW, [place] Office: [phone] Cell: [phone] Fax: [phone]

## 2021-10-28 NOTE — Progress Notes (Signed)
Pressure injuries to right buttocks; MD aware and made face to face visit. Blanchable scattered redness noted to buttocks.    Yehuda Mao, LPN

## 2021-10-29 MED ORDER — SODIUM CHLORIDE 1 G PO TABS
1.0000 g | ORAL_TABLET | Freq: Three times a day (TID) | ORAL | Status: DC
  Administered 2021-10-29 – 2021-10-31 (×6): 1 g via ORAL
  Filled 2021-10-29 (×6): qty 1

## 2021-10-29 MED ORDER — SODIUM CHLORIDE 1 G PO TABS: 1.0000 g | ORAL_TABLET | Freq: Three times a day (TID) | ORAL | Status: DC

## 2021-10-29 NOTE — Progress Notes (Signed)
Occupational Therapy Session Note  Patient Details  Name: Billy Anderson MRN: [id number] Date of Birth: May 23, 1965  Today's Date: 10/29/2021 OT Individual Time: 1100-1200 OT Individual Time Calculation (min): 60 min   Today's Date: 10/29/2021 OT Individual Time: 6384-5364 OT Individual Time Calculation (min): 55 min   Short Term Goals: Week 1:  OT Short Term Goal 1 (Week 1): Pt will complete full shower with supervision OT Short Term Goal 2 (Week 1): Pt will groom in standing for 2 tasks to dmeo imrpoved activity tolerance OT Short Term Goal 3 (Week 1): Pt will transfer to toilet with supervision  Skilled Therapeutic Interventions/Progress Updates:     Pt received in recliner with no pain.   Therapeutic exercise  Kinetron strengthening 20 CM/second 2x1 min and 10CM/sec 2x1 min with 1 min rest break between rounds for BLE strengthening with cuing to lean forward to target glutes and no hand support seated to target abdominal work.    Therapeutic activity Pt completes kitchen search into various height cabinets/appliaces in prep for IADL retraining from ambulatory level with mod cuing for safety/positioning throughout environment with use of Rolator. Pt uses reacher to improve reach/safety and seated of Rolator with cuing for transportation of items with education on energy conservation techniques throughout activity  [place] transfer, Car transfer, High bed transfer, seated rollator rest break with cuing for RW management during couch transfer and safe use of running board for simulated [place] height transfer.  Pt left at end of session in recliner with no exit alarm on d/t cleared for independence in room, call light in reach and all needs met  Session 2: Pt received in recliner with no pain. Pt requesting to begin by completing 1x8 min on level 6 and 1x7 min on level 7 for warm up, reciprocal movement training, and activity tolerance. Pt likes to complete for LE strengthening and  keeping back lose. Pt completes full flight of stairs with L hand rail and BUE for up and down stairs 2x with seated rest break in between rounds. Seated and standing ball toss to rebounder 2x each position, seated core work 2x10 sit ups and 2x10 trunk twists. Exited session with pt seated in bed, exit alarm on and call light in reach    Therapy Documentation Precautions:  Precautions Precautions: Fall, Back Precaution Comments: back for comfort Restrictions Weight Bearing Restrictions: No  Therapy/Group: Individual Therapy  Tonny Branch 10/29/2021, 6:54 AM

## 2021-10-29 NOTE — Progress Notes (Signed)
PROGRESS NOTE   Subjective/Complaints: Pt asked about bruising on arms and where it was coming from. Otherwise doing fairly well  ROS: Patient denies fever, rash, sore throat, blurred vision, dizziness, nausea, vomiting, diarrhea, cough, shortness of breath or chest pain,   headache, or mood change.    Objective:   No results found. Recent Labs    10/27/21 0557  WBC 7.0  HGB 12.3*  HCT 34.6*  PLT 368   Recent Labs    10/27/21 0557  NA 138  K 4.8  CL 101  CO2 28  GLUCOSE 99  BUN 17  CREATININE 0.78  CALCIUM 9.4    Intake/Output Summary (Last 24 hours) at 10/29/2021 0825 Last data filed at 10/29/2021 0555 Gross per 24 hour  Intake 830 ml  Output 1450 ml  Net -620 ml         Physical Exam: Vital Signs Blood pressure 114/74, pulse 73, temperature 97.8 F (36.6 C), temperature source Oral, resp. rate 20, height 6\' 1"  (1.854 m), weight 80.9 kg, SpO2 96 %.  Constitutional: No distress . Vital signs reviewed. HEENT: NCAT, EOMI, oral membranes moist Neck: supple Cardiovascular: RRR without murmur. No JVD    Respiratory/Chest: CTA Bilaterally without wheezes or rales. Normal effort    GI/Abdomen: BS +, non-tender, non-distended Ext: no clubbing, cyanosis, or edema Psych: pleasant and cooperative   Skin: buttock wounds with small superficial scabs, peeling off. Small bruises/petechiae on forearms, a few small scabs  Neuro:  Alert and oriented x 3. Follows commands. Normal insight and awareness. Intact Memory. Normal language and speech. Cranial nerve exam unremarkable. Motor: 5/5 UE (except right shoulder), LE: 4HF, KE and 4+/5 ADF/PF. No obvious sensory findings. DTR's trace in LE's. No resting tone. Musculoskeletal:  . Unable to perform initial 20-30 deg shoulder abduction on right. .      Assessment/Plan: 1. Functional deficits which require 3+ hours per day of interdisciplinary therapy in a comprehensive  inpatient rehab setting. Physiatrist is providing close team supervision and 24 hour management of active medical problems listed below. Physiatrist and rehab team continue to assess barriers to discharge/monitor patient progress toward functional and medical goals  Care Tool:  Bathing    Body parts bathed by patient: Right arm, Left arm, Chest, Abdomen, Front perineal area, Buttocks, Right upper leg, Left upper leg, Right lower leg, Left lower leg, Face         Bathing assist Assist Level: Contact Guard/Touching assist     Upper Body Dressing/Undressing Upper body dressing   What is the patient wearing?: Pull over shirt    Upper body assist Assist Level: Contact Guard/Touching assist    Lower Body Dressing/Undressing Lower body dressing      What is the patient wearing?: Pants     Lower body assist Assist for lower body dressing: Contact Guard/Touching assist     Toileting Toileting    Toileting assist Assist for toileting: Independent with assistive device Assistive Device Comment: walker   Transfers Chair/bed transfer  Transfers assist     Chair/bed transfer assist level: Contact Guard/Touching assist     Locomotion Ambulation   Ambulation assist      Assist  level: Contact Guard/Touching assist Assistive device: Walker-rolling Max distance: 294 ft   Walk 10 feet activity   Assist     Assist level: Contact Guard/Touching assist Assistive device: Walker-rolling   Walk 50 feet activity   Assist    Assist level: Contact Guard/Touching assist Assistive device: Walker-rolling    Walk 150 feet activity   Assist    Assist level: Contact Guard/Touching assist Assistive device: Walker-rolling    Walk 10 feet on uneven surface  activity   Assist     Assist level: Minimal Assistance - Patient > 75% Assistive device: Walker-rolling   Wheelchair     Assist Is the patient using a wheelchair?: Yes Type of Wheelchair: Manual     Wheelchair assist level: Supervision/Verbal cueing Max wheelchair distance: >150 ft    Wheelchair 50 feet with 2 turns activity    Assist        Assist Level: Supervision/Verbal cueing   Wheelchair 150 feet activity     Assist      Assist Level: Supervision/Verbal cueing   Blood pressure 114/74, pulse 73, temperature 97.8 F (36.6 C), temperature source Oral, resp. rate 20, height 6\' 1"  (1.854 m), weight 80.9 kg, SpO2 96 %.  Medical Problem List and Plan: 1. Functional deficits secondary to ambulatory dysfunction/lumbar spinal stenosis/close compression fracture L5 lumbar vertebra             -patient may shower             -ELOS/Goals: 10/14, PT/OT supervision goals               -Continue CIR therapies including PT, OT  2.  Antithrombotics: -DVT/anticoagulation:  Mechanical: Antiembolism stockings, thigh (TED hose) Bilateral lower extremities             -antiplatelet therapy: baby ASA  -explained to him that small petechiae may be coming from aspirin, +/- ETOH 3. Pain Management/chronic back pain: Neurontin 300 mg twice daily 4. Anxiety/bipolar disorder/Sleep: Melatonin 10 mg nightly as needed.             -antipsychotic agents: Zyprexa 20 mg nightly, Lamictal 200 mg nightly  -affect normal 5. Neuropsych/cognition: This patient is capable of making decisions on his own behalf. 6. Skin/Wound Care: buttock wounds essentially healed--can dc foam dressings  -pressure relief, nutrition  7. Fluids/Electrolytes/Nutrition: encourage PO  -albumin low--add protein supp  -sodium up to 138 10/9   -dc'ed salt tabs 8.  PAF.  Not on anticoagulation prior to admission.  Cardiac rate controlled.  Cardizem 360 mg daily.  Aspirin currently on hold.  HR controlled  9.  Alcohol/tobacco abuse.  Provide counseling.  Off CIWA, Continue thiamine as well as folic acid.  Patient on Depade 50 mg daily prior to admission.  NicoDerm patch ongoing 10.  Transaminitis/hyperbilirubinemia.   Pattern consistent with alcohol.  CK normalized. 11.  Hypertension.  Continue Cardizem.  Lisinopril held due to hyponatremia.    -10/11 bp controlled 12.  Elevated AST.  CK within normal limits.  Likely from alcohol.    10/5 WNL 13.  Chronic thrombocytopenia:  Likely due to alcohol abuse.      -platelets up to 368 14.  Positive Hemoccult/chronic diarrhea.  H&H stable.  No overt bleeding.  Outpatient follow-up GI. Diarrhea has improved.    Continue PPI  10/5 hgb up to 12.6. follow up on Monday   10/7 last BM   Scheduled Senokot to prevent constipation 15.  GERD.  Continue Protonix 16.  History of  seizure disorder.  Continue Lamictal 17. Hypomagnesemia. 2.0 10/9 -Continue Mag ox 18. Seizure disorder:  -pt has not had a seizure since 10/22  -he remains on lamictal as prescribed  -10/11 had another serious talk today about ETOH use  -he may return to driving 19. Right shoulder weakness  -states he might have injured it about a year ago  -appears to be right supraspinatus injury, likely complete tear  -consider MRI as outpt. Surgical options are probably pretty limited at this point      LOS: 7 days A FACE TO [place] 10/29/2021, 8:25 AM

## 2021-10-29 NOTE — Progress Notes (Signed)
Physical Therapy Session Note  Patient Details  Name: Kael Keetch MRN: [id number] Date of Birth: 1965/03/06  Today's Date: 10/29/2021 PT Individual Time: 1503-1530 PT Individual Time Calculation (min): 27 min   Short Term Goals: Week 2:  PT Short Term Goal 1 (Week 2): STG=LTG due to ELOS.  Skilled Therapeutic Interventions/Progress Updates:     Pt received seated in recliner and agrees to therapy. No complaint of pain. Sit to stand mod(I) with rollator. Pt ambulates to gym, x200', with rollator and cues for upright gaze to improve balance. PT explains 6 minute walk test to patient and rationale for completing test. Pt ambulates x760' with rollator, with cues to increase L stride length and step height, and without rest breaks required. Following test, pt takes seated rest break on rollator with cues for positioning. Pt stands and ambulates back to room, x400'. Left seated with all needs within reach.  Therapy Documentation Precautions:  Precautions Precautions: Fall, Back Precaution Comments: back for comfort Restrictions Weight Bearing Restrictions: No   Therapy/Group: Individual Therapy  Breck Coons, PT, DPT 10/29/2021, 4:14 PM

## 2021-10-29 NOTE — Progress Notes (Signed)
Physical Therapy Weekly Progress Note  Patient Details  Name: Billy Anderson MRN: [id number] Date of Birth: 26-Dec-1965  Beginning of progress report period: October 23, 2021 End of progress report period: October 29, 2021  Today's Date: 10/29/2021 PT Individual Time: 0800-0900 PT Individual Time Calculation (min): 60 min   Patient has met 3 of 3 short term goals.  Patient with steady progress this week, progressing to supervision-mod I with all mobility using a rollator or RW. Patient to be mod I at home and have supervision in the community. Plan for patient to be mod I in the room for the remainder of stay to simulate home in a controlled environment. Planning for family education with his wife for safety, fall prevention, and community mobility and a community outing with rec therapy prior to d/c, as patient was not going out in the community PTA and would benefit from a simulated experience with skilled staff the first time he goes to a store and promote increased patient participation in community activities.  Patient continues to demonstrate the following deficits muscle weakness and muscle joint tightness, decreased cardiorespiratoy endurance, unbalanced muscle activation and decreased coordination, and decreased standing balance, decreased postural control, decreased balance strategies, and therefore will continue to benefit from skilled PT intervention to increase functional independence with mobility.  Patient progressing toward long term goals..  Continue plan of care.  PT Short Term Goals Week 1:  PT Short Term Goal 1 (Week 1): Patient will perform basic transfers with supervision consistently using LRAD. PT Short Term Goal 1 - Progress (Week 1): Met PT Short Term Goal 2 (Week 1): Patient will improve score on Berg Balance Scale by 7 points to meet MCID for reduced fall risk. PT Short Term Goal 2 - Progress (Week 1): Met PT Short Term Goal 3 (Week 1): Patient will ambulate with  supervision >400 feet using LRAD. PT Short Term Goal 3 - Progress (Week 1): Met Week 2:  PT Short Term Goal 1 (Week 2): STG=LTG due to ELOS.  Skilled Therapeutic Interventions/Progress Updates:     Patient in bed with LPN in the room for med admin upon PT arrival. Patient alert and agreeable to PT session. Patient denied pain during session. Patient did not receive a breakfast tray this morning, patient called down for a tray at beginning of session.   Therapeutic Activity: Focused session on patient using teach-back method for "morning routine," to assess patient's independence with balance, household gait with rollator, and safety awareness while performing ADLs including bed mobility, transfers, gait, gathering items, showering, dressing, and brushing his teeth. Performed all mobility and ADLs with mod I, except patient did require set-up assist for showering, but able to teach-back set up for safety. Transitioned from rollator to w/c at the sink to comb his hair and brush his teeth, able to teach back locking breaks on chair before sitting or standing for safety and propel chair in the room with his feet. Educated on walking with non-skid socks or shoes in the room for fall prevention.   Neuromuscular Re-ed: Berg Balance Test Sit to Stand: Able to stand  independently using hands Standing Unsupported: Able to stand 2 minutes with supervision Sitting with Back Unsupported but Feet Supported on Floor or Stool: Able to sit safely and securely 2 minutes Stand to Sit: Controls descent by using hands Transfers: Able to transfer safely, definite need of hands Standing Unsupported with Eyes Closed: Able to stand 10 seconds with supervision Standing Ubsupported with Feet  Together: Needs help to attain position but able to stand for 30 seconds with feet together From Standing, Reach Forward with Outstretched Arm: Reaches forward but needs supervision From Standing Position, Pick up Object from Floor:  Able to pick up shoe, needs supervision From Standing Position, Turn to Look Behind Over each Shoulder: Needs supervision when turning Turn 360 Degrees: Needs assistance while turning Standing Unsupported, Alternately Place Feet on Step/Stool: Needs assistance to keep from falling or unable to try Standing Unsupported, One Foot in Front: Needs help to step but can hold 15 seconds Standing on One Leg: Unable to try or needs assist to prevent fall Total Score: 26 (improved from 9/56 on 10/5) Patient demonstrated increased fall risk noted by score of 26/56 on the Berg Balance Scale.  <45/56 = fall risk, <42/56 = predictive of recurrent falls, <40/56 = 100% fall risk  >41 = independent, 21-40 = assistive device, 0-20 = wheelchair level  MDC 6.9 (4 pts 45-56, 5 pts 35-44, 7 pts 25-34) (ANPTA Core Set of Outcome Measures for Adults with Neurologic Conditions, 2018)  Patient in recliner in the room at end of session with breaks locked and all needs within reach. Patient cleared to be independent in the room with use of rollator or RW and wearing non-skid socks or shoes. Encouraged patient to call for assistance as needed, especially at night if he is drowsy to prevent falls. Patient NOT cleared for showering without staff assist for set-up, patient stated understanding to all recommendations and safety concerns. LPN made aware and independent sign posted outside patient's room with safety plan updated.  Noted patient had a bag with cigarettes and over the   Therapy Documentation Precautions:  Precautions Precautions: Fall, Back Precaution Comments: back for comfort Restrictions Weight Bearing Restrictions: No  Therapy/Group: Individual Therapy  Yaqub Arney L Latonyia Lopata PT, DPT, NCS, CBIS  10/29/2021, 12:53 PM

## 2021-10-30 MED ORDER — SORBITOL 70 % SOLN
60.0000 mL | Status: AC
  Administered 2021-10-30: 60 mL via ORAL
  Filled 2021-10-30: qty 60

## 2021-10-30 NOTE — Progress Notes (Signed)
Patient ID: Billy Anderson, male   DOB: 07/10/1965, 56 y.o.   MRN: [id number]  SW left message for pt wife [name] as she has some questions/concerns about his discharge. SW relayed to physicians request for follow-up to discuss her medical concerns.   Loralee Pacas, MSW, [place] Office: [phone] Cell: [phone] Fax: [phone]

## 2021-10-30 NOTE — Progress Notes (Signed)
Physical Therapy Session Note  Patient Details  Name: Billy Anderson MRN: [id number] Date of Birth: May 23, 1965  Today's Date: 10/30/2021 PT Individual Time: 1345-1445 PT Individual Time Calculation (min): 60 min   Short Term Goals: Week 2:  PT Short Term Goal 1 (Week 2): STG=LTG due to ELOS.  Skilled Therapeutic Interventions/Progress Updates:     Patient in recliner in the room upon PT arrival. Patient alert and agreeable to PT session. Patient denied pain during session.  Patient's wife present for family education and hands on training throughout session. Performed safe guarding with all mobility following PT cues and/or demonstration. Educated on fall risk/prevention, home modifications to prevent falls, and activation of emergency services in the event of a fall during session.   Spent increased time educating on behavior change modification in relation to alcohol cessation, safety awareness, increased activity and community mobility, and establishing and following-up with health care providers for improved overall health, reduced fall risk, and improved quality of life. Patient's wife with several questions regarding patient's previous injuries, alcohol consumption, medical follow-up, and patient's hx of seizure activity. Reports she believes the patient has had unwitnessed seizures over the past year. Differed to medical team, she requested to speak with Dr. Naaman Plummer, [place] and MD made aware.   Therapeutic Activity: Transfers: Patient performed sit to/from stand independently from various household heights with mod I using rollator or RW.  Patient performed a simulated SUV height car transfer with set-up assist using RW.   Gait Training:  Patient ambulated 100-250 feet x3 using rollator with supervision-mod I. Ambulated with decreased gait speed, decreased step length and height, forward trunk lean, and downward head gaze. Provided verbal cues x1 for erect posture with shoulder  retraction, looking up and visually scanning his environment, and increased step height.  Patient ascended/descended 12x6" steps using L rail with B upper extremity support with close supervision. Performed step-to gait pattern leading with R while ascending and L while descending. Provided cues for technique and sequencing.   Patient ambulated up/down a ramp, over 10 feet of mulch (unlevel surface), and up/down a curb to simulate community ambulation over unlevel surfaces with supervision using RW. Provided cues for technique and use of AD.  Patient in recliner in the room with his wife at end of session with breaks locked and all needs within reach. Patient cleared to be mod I in the room by therapy yesterday, no alarm set, no concerns about patient safety from nursing staff or during session.  Educated on HEP and community outing to be done during therapies tomorrow. Patient and his wife appreciative and with no further questions at this time. Patient states the patient is moving much better than PTA and very appreciative of rehab staff.   Therapy Documentation Precautions:  Precautions Precautions: Fall, Back Precaution Comments: back for comfort Restrictions Weight Bearing Restrictions: No    Therapy/Group: Individual Therapy  Zehra Rucci L Raylene Carmickle PT, DPT, NCS, CBIS  10/30/2021, 4:17 PM

## 2021-10-30 NOTE — Progress Notes (Signed)
Occupational Therapy Session Note  Patient Details  Name: Billy Anderson MRN: [id number] Date of Birth: [birth year]  Today's Date: 10/30/2021 OT Individual Time: 0700-0809 OT Individual Time Calculation (min): 69 min   40 min individual time 1305-1345 time   Short Term Goals: Week 1:  OT Short Term Goal 1 (Week 1): Pt will complete full shower with supervision OT Short Term Goal 2 (Week 1): Pt will groom in standing for 2 tasks to dmeo imrpoved activity tolerance OT Short Term Goal 3 (Week 1): Pt will transfer to toilet with supervision  Skilled Therapeutic Interventions/Progress Updates:    Pt received in recliner with 6 out of 10 pain in back. Tylenol and rest breaks provided for pain relief  Therapeutic exercise 15 min Nustep training on level 6 per t request to "loosten up back" as a warm up for functional mobility and exercise   Therapeutic activity Fucntional mobility in ADL apartment with practicing couch transfer with use of Rolator 2x for transfer with better carryover of management of Rolator   Shower stall transfer with Rolator and BSC in shower to demo improved sit to stand safety in shower in prep for family ed this afternoon.  High bed transfer practice in tx gym with mat elevated to pt report of bed height with supervision 2x.  Balance/UB circuit: initially with MIN A for first round with decreased weight shifting causing missing cones/stepping on hula hoop with  min LOB, second time with improved weight shifting after education/demo and only needs guarding A. NO AD used.  -W/c push ups -Toe taps to cone -ant/pot + lateral stepping around hula hoop   Pt left at end of session in bed with exit alarm on, call light in reach and all needs met  Session 2: Provided the following written instructions while waiting for wife who was stuck in traffic. Wife arrives and asks all questions. Pt demo functional mobility with walker, bathroom and couch transfers.  General  mobility- Billy Anderson should always use their walker (Rolator) The walker should be kept within reach so they can pull it close to get up and keep with them to back up to any surface they want to sit on. When getting up, they should push up from the surface they are getting up from and reach back when sitting to a new surface, no plopping.  If you are attempting to get up/transfer and it is not going well, reset. Have them sit back down. Make sure they are close to the edge of the seat, feet are underneath them at hips distance, and they are leaning forward to stand up. At this time Billy Anderson should not use a cane. The walker helps support him more and will decrease his risk for falling.  Get up and walk frequently don't sit in the couch all day. Follow any exercise programs therapy provides to continue to improve strength and balance.  Bathing- they should sit to bathe on a shower chair, especially for washing legs/feet. Sitting will save energy and increase safety. Walk in shower: walk up to the shower ledge, turn around and back up to the ledge with the walker. Keep both hands-on walker while they step back one foot at a time. Put the bed side commode in the shower to sit on.  Dressing- all should be done from a SEATED level, especially to put underwear and pants over feet.  Toileting- seated toileting is more appropriate, have them walk up to the toilet and keep walker with them  as they turn to sit to toilet or BSC. Before they stand to pull up pants past hips they should pull pants/underwear up past their knees to decrease the need to bend forward to the floor. Sometimes this makes people dizzy if incontinence/bathroom accidents are an issue attempt to toilet every 2-3 hours to improve success with toileting and decrease accidents.  Energy conservation principles- Prioritize what needs to be done and what can be moved to another day Plan out their days, weeks, months to spread out taxing (physical or cognitively  tiring) activities to not put too much at one time Pace activities- rest before feeling tired and have designated places to rest if they feel tired and need to take a brake Position for success: sit when able to conserve 25% more energy than standing  Exited session with pt seated in recliner with PT in room,  Therapy Documentation Precautions:  Precautions Precautions: Fall, Back Precaution Comments: back for comfort Restrictions Weight Bearing Restrictions: No  Therapy/Group: Individual Therapy  Tonny Branch 10/30/2021, 6:49 AM

## 2021-10-30 NOTE — Progress Notes (Signed)
PROGRESS NOTE   Subjective/Complaints: No new issues. Mod I in room. Happy with progress.   ROS: Patient denies fever, rash, sore throat, blurred vision, dizziness, nausea, vomiting, diarrhea, cough, shortness of breath or chest pain, joint or back/neck pain, headache, or mood change.    Objective:   No results found. No results for input(s): "WBC", "HGB", "HCT", "PLT" in the last 72 hours.  No results for input(s): "NA", "K", "CL", "CO2", "GLUCOSE", "BUN", "CREATININE", "CALCIUM" in the last 72 hours.   Intake/Output Summary (Last 24 hours) at 10/30/2021 0927 Last data filed at 10/30/2021 0350 Gross per 24 hour  Intake 236 ml  Output 1675 ml  Net -1439 ml         Physical Exam: Vital Signs Blood pressure 116/63, pulse 62, temperature 97.9 F (36.6 C), temperature source Oral, resp. rate 14, height 6\' 1"  (1.854 m), weight 83.1 kg, SpO2 97 %.  Constitutional: No distress . Vital signs reviewed. HEENT: NCAT, EOMI, oral membranes moist Neck: supple Cardiovascular: RRR without murmur. No JVD    Respiratory/Chest: CTA Bilaterally without wheezes or rales. Normal effort    GI/Abdomen: BS +, non-tender, non-distended Ext: no clubbing, cyanosis, or edema Psych: pleasant and cooperative   Skin: buttock wounds with small superficial scabs, peeling off. Small bruises/petechiae on forearms, a few small scabs --healing Neuro:  Alert and oriented x 3. Follows commands. Normal insight and awareness. Intact Memory. Normal language and speech. Cranial nerve exam unremarkable. Motor: 5/5 UE (except right shoulder), LE: 4HF, KE and 4+/5 ADF/PF--stable to improved. No obvious sensory findings. DTR's trace in LE's. No resting tone. Musculoskeletal:  Unable to perform initial 20-30 deg shoulder abduction on right. .      Assessment/Plan: 1. Functional deficits which require 3+ hours per day of interdisciplinary therapy in a  comprehensive inpatient rehab setting. Physiatrist is providing close team supervision and 24 hour management of active medical problems listed below. Physiatrist and rehab team continue to assess barriers to discharge/monitor patient progress toward functional and medical goals  Care Tool:  Bathing    Body parts bathed by patient: Right arm, Left arm, Chest, Abdomen, Front perineal area, Buttocks, Right upper leg, Left upper leg, Right lower leg, Left lower leg, Face         Bathing assist Assist Level: Contact Guard/Touching assist     Upper Body Dressing/Undressing Upper body dressing   What is the patient wearing?: Pull over shirt    Upper body assist Assist Level: Contact Guard/Touching assist    Lower Body Dressing/Undressing Lower body dressing      What is the patient wearing?: Pants     Lower body assist Assist for lower body dressing: Contact Guard/Touching assist     Toileting Toileting    Toileting assist Assist for toileting: Independent with assistive device Assistive Device Comment: walker   Transfers Chair/bed transfer  Transfers assist     Chair/bed transfer assist level: Contact Guard/Touching assist     Locomotion Ambulation   Ambulation assist      Assist level: Contact Guard/Touching assist Assistive device: Walker-rolling Max distance: 294 ft   Walk 10 feet activity   Assist  Assist level: Contact Guard/Touching assist Assistive device: Walker-rolling   Walk 50 feet activity   Assist    Assist level: Contact Guard/Touching assist Assistive device: Walker-rolling    Walk 150 feet activity   Assist    Assist level: Contact Guard/Touching assist Assistive device: Walker-rolling    Walk 10 feet on uneven surface  activity   Assist     Assist level: Minimal Assistance - Patient > 75% Assistive device: Walker-rolling   Wheelchair     Assist Is the patient using a wheelchair?: Yes Type of  Wheelchair: Manual    Wheelchair assist level: Supervision/Verbal cueing Max wheelchair distance: >150 ft    Wheelchair 50 feet with 2 turns activity    Assist        Assist Level: Supervision/Verbal cueing   Wheelchair 150 feet activity     Assist      Assist Level: Supervision/Verbal cueing   Blood pressure 116/63, pulse 62, temperature 97.9 F (36.6 C), temperature source Oral, resp. rate 14, height 6\' 1"  (1.854 m), weight 83.1 kg, SpO2 97 %.  Medical Problem List and Plan: 1. Functional deficits secondary to ambulatory dysfunction/lumbar spinal stenosis/close compression fracture L5 lumbar vertebra             -patient may shower             -ELOS/Goals: 10/14, PT/OT supervision goals           -Continue CIR therapies including PT, OT, community outing tomorrow  -wife had questions. Called her yesterday but no answer. Asked her to follow up re: good time to talk 2.  Antithrombotics: -DVT/anticoagulation:  Mechanical: Antiembolism stockings, thigh (TED hose) Bilateral lower extremities             -antiplatelet therapy: baby ASA  -explained to him that small petechiae may be coming from aspirin, +/- ETOH 3. Pain Management/chronic back pain: Neurontin 300 mg twice daily 4. Anxiety/bipolar disorder/Sleep: Melatonin 10 mg nightly as needed.             -antipsychotic agents: Zyprexa 20 mg nightly, Lamictal 200 mg nightly  -affect normal 5. Neuropsych/cognition: This patient is capable of making decisions on his own behalf. 6. Skin/Wound Care: buttock wounds essentially healed--can dc foam dressings  -pressure relief, nutrition  7. Fluids/Electrolytes/Nutrition: encourage PO  -albumin low--add protein supp  -sodium up to 138 10/9   -dc'ed salt tabs 8.  PAF.  Not on anticoagulation prior to admission.  Cardiac rate controlled.  Cardizem 360 mg daily.  Aspirin currently on hold.  HR controlled  9.  Alcohol/tobacco abuse.  Provide counseling.  Off CIWA, Continue  thiamine as well as folic acid.  Patient on Depade 50 mg daily prior to admission.  NicoDerm patch ongoing 10.  Transaminitis/hyperbilirubinemia.  Pattern consistent with alcohol.  CK normalized. 11.  Hypertension.  Continue Cardizem.  Lisinopril held due to hyponatremia.    -10/12 bp controlled 12.  Elevated AST.  CK within normal limits.  Likely from alcohol.    10/5 WNL 13.  Chronic thrombocytopenia:  Likely due to alcohol abuse.      -platelets up to 368 14.  Positive Hemoccult/chronic diarrhea.  H&H stable.  No overt bleeding.  Outpatient follow-up GI. Diarrhea has improved.    Continue PPI  10/5 hgb up to 12.6. follow up on Monday   10/7 last BM   Scheduled Senokot to prevent constipation  10/12 still no bm---sorbitol today 15.  GERD.  Continue Protonix 16.  History of  seizure disorder.  Continue Lamictal 17. Hypomagnesemia. 2.0 10/9 -Continue Mag ox 18. Seizure disorder:  -pt has not had a seizure since 10/22  -he remains on lamictal as prescribed  -10/12 had another serious talk today about ETOH use  -he may return to driving 19. Right shoulder weakness  -states he might have injured it about a year ago  -appears to be right supraspinatus injury, likely complete tear  -consider MRI as outpt. Surgical options are probably pretty limited at this point      LOS: 8 days A FACE TO [place] 10/30/2021, 9:27 AM

## 2021-10-30 NOTE — Progress Notes (Signed)
Physical Therapy Discharge Summary  Patient Details  Name: Billy Anderson MRN: [id number] Date of Birth: 1965-02-22  Date of Discharge from PT service:October 31, 2020  Today's Date: 10/30/2021 PT Individual Time: 1345-1445 PT Individual Time Calculation (min): 60 min    Patient has met 9 of 10 long term goals due to improved activity tolerance, improved balance, improved postural control, increased strength, and ability to compensate for deficits.  Patient to discharge at an ambulatory level Modified Independent with rollator for household mobility and supervision with RW for community mobility.   Patient's care partner is independent to provide the necessary physical assistance at discharge.  Reasons goals not met: Patient unable to perform floor transfer due to proximal lower extremity weakness and decreased strength/ROM of upper extremities. Able to perform partial transfer with mod A and recommend progressing with OPPT. All other goals met at this time and current functional status is adequate for d/c.  Recommendation:  Patient will benefit from ongoing skilled PT services in outpatient setting to continue to advance safe functional mobility, address ongoing impairments in balance, activity tolerance, strength, functional mobility, gait and stair training, community integration, fall recovery, patient/caregiver education, and minimize fall risk.  Equipment: Recommending rollator for household mobility and RW for community mobility  Reasons for discharge: treatment goals met  Patient/family agrees with progress made and goals achieved: Yes  PT Discharge Precautions/Restrictions Restrictions Weight Bearing Restrictions: No Pain Pain Assessment Pain Score: 0-No pain Pain Interference Pain Interference Pain Effect on Sleep: 1. Rarely or not at all Pain Interference with Therapy Activities: 1. Rarely or not at all Pain Interference with Day-to-Day Activities: 1. Rarely or not  at all Vision/Perception  Vision - History Ability to See in Adequate Light: 0 Adequate Perception Perception: Within Functional Limits Praxis Praxis: Intact  Cognition Overall Cognitive Status: History of cognitive impairments - at baseline (patient's wife reports pt with decreased memory and recall of events since seizures >2 years ago, states patient is currently at baseline) Arousal/Alertness: Awake/alert Orientation Level: Oriented X4 Year: 2023 Month: October Day of Week: Correct Memory: Impaired Memory Impairment: Retrieval deficit;Decreased long term memory Decreased Short Term Memory: Verbal complex Awareness: Appears intact Problem Solving: Appears intact Safety/Judgment: Appears intact Comments: Requires continued education/support for alcohol cessation and behavior modification Sensation Sensation Light Touch: Appears Intact (baseline sensory deficits along L ulnar nerve distribution) Hot/Cold: Appears Intact Proprioception: Impaired by gross assessment (B lower extremities) Coordination Gross Motor Movements are Fluid and Coordinated: No Fine Motor Movements are Fluid and Coordinated: No Coordination and Movement Description: decreased postural control, motor control, and proprioception of B lower extremities, limited ROM and strenght due to old R shoulder injury Heel Shin Test: slow and deliberate with dysmetria bilaterally Motor  Motor Motor: Abnormal postural alignment and control;Paraplegia  Mobility Bed Mobility Bed Mobility: Supine to Sit;Rolling Right;Rolling Left;Sit to Supine Rolling Right: Independent Rolling Left: Independent Supine to Sit: Independent Sit to Supine: Independent Transfers Transfers: Sit to Stand;Stand to Sit;Stand Pivot Transfers Sit to Stand: Independent with assistive device Stand to Sit: Independent with assistive device Stand Pivot Transfers: Independent with assistive device Transfer (Assistive device): 4-wheeled walker (or  RW) Locomotion  Gait Ambulation: Yes Gait Assistance: Supervision/Verbal cueing;Independent with assistive device (mod I household distances, supervision community distances/surfaces) Social research officer, government (Feet): 921 Feet Assistive device: 4-wheeled walker Gait Assistance Details: supervision with longer distance due to forward trunk flexion and decreased step height with fatigue Gait Gait: Yes Gait Pattern: Step-through pattern;Decreased stride length;Lateral hip instability;Decreased trunk rotation;Trunk  flexed;Decreased hip/knee flexion - left;Decreased hip/knee flexion - right Gait velocity: 0.64 m/s avg on 6MWT Stairs / Additional Locomotion Stairs: Yes Stairs Assistance: Supervision/Verbal cueing Stair Management Technique: One rail Left (B UE support on 1 rail) Number of Stairs: 12 Height of Stairs: 6 Ramp: Supervision/Verbal cueing (with RW) Curb: Supervision/Verbal cueing (with RW) Pick up small object from the floor assist level: Supervision/Verbal cueing Wheelchair Mobility Wheelchair Mobility: No  Trunk/Postural Assessment  Cervical Assessment Cervical Assessment: Exceptions to [facility] (head forward) Thoracic Assessment Thoracic Assessment: Exceptions to [facility] (rounded shoudlers) Lumbar Assessment Lumbar Assessment: Exceptions to [facility] (posterior pelvic tilt) Postural Control Postural Control: Deficits on evaluation (delayed/reliant on external aides- grab bars/walker)  Balance Standardized Balance Assessment Standardized Balance Assessment: Berg Balance Test Berg Balance Test Sit to Stand: Able to stand  independently using hands Standing Unsupported: Able to stand 2 minutes with supervision Sitting with Back Unsupported but Feet Supported on Floor or Stool: Able to sit safely and securely 2 minutes Stand to Sit: Controls descent by using hands Transfers: Able to transfer safely, definite need of hands Standing Unsupported with Eyes Closed: Able to stand 10 seconds with  supervision Standing Ubsupported with Feet Together: Needs help to attain position but able to stand for 30 seconds with feet together From Standing, Reach Forward with Outstretched Arm: Reaches forward but needs supervision From Standing Position, Pick up Object from Floor: Able to pick up shoe, needs supervision From Standing Position, Turn to Look Behind Over each Shoulder: Needs supervision when turning Turn 360 Degrees: Needs assistance while turning Standing Unsupported, Alternately Place Feet on Step/Stool: Needs assistance to keep from falling or unable to try Standing Unsupported, One Foot in Front: Needs help to step but can hold 15 seconds Standing on One Leg: Unable to try or needs assist to prevent fall Total Score: 26 Dynamic Sitting Balance Dynamic Sitting - Balance Support: During functional activity;Feet supported Dynamic Sitting - Level of Assistance: 7: Independent Static Standing Balance Static Standing - Balance Support: During functional activity;Bilateral upper extremity supported Static Standing - Level of Assistance: 6: Modified independent (Device/Increase time) Dynamic Standing Balance Dynamic Standing - Balance Support: Right upper extremity supported;Left upper extremity supported;During functional activity Dynamic Standing - Level of Assistance: 6: Modified independent (Device/Increase time) Extremity Assessment   RLE Assessment General Strength Comments: Grossly 5/5 throughout in sitting, except hip flexion 4+/5 LLE Assessment General Strength Comments: Grossly 5/5 throughout in sitting, except hip flexion 4+/5     Cherie L Grunenberg PT, DPT, NCS, CBIS  10/30/2021, 4:48 PM

## 2021-10-30 NOTE — Progress Notes (Signed)
Physical Therapy Session Note  Patient Details  Name: Billy Anderson MRN: [id number] Date of Birth: 1965/07/02  Today's Date: 10/30/2021 PT Individual Time: [phone]    40 min   Short Term Goals: Week 1:  PT Short Term Goal 1 (Week 1): Patient will perform basic transfers with supervision consistently using LRAD. PT Short Term Goal 1 - Progress (Week 1): Met PT Short Term Goal 2 (Week 1): Patient will improve score on Berg Balance Scale by 7 points to meet MCID for reduced fall risk. PT Short Term Goal 2 - Progress (Week 1): Met PT Short Term Goal 3 (Week 1): Patient will ambulate with supervision >400 feet using LRAD. PT Short Term Goal 3 - Progress (Week 1): Met Week 2:  PT Short Term Goal 1 (Week 2): STG=LTG due to ELOS. Week 3:     Skilled Therapeutic Interventions/Progress Updates:   Pt received sitting in recliner and agreeable to PT. Pt performed sit<>stand transfer without assist, or cues  from PT for safety. UE supported on rollatro  Gait training with rollator through hall and rehab gym 2 x 174f and 175fwith distant supervision assist and with cues for Rollator brake with transfers.   Standing balance.tolerance while engaged in gross motor task of Wii bowling pt able to tolerate 5 frames and 3 frames standing prior to needing seated rest break due to BLE fatigue.     Nustep reciprocal endurance training x 6 minutes level 7 with cues for full ROM and consistent SPM. Patient returned to room and performed stand pivot to recliner with rollator and distant supervision assist . Pt left sitting in recliner with call bell in reach and all needs met.     Therapy Documentation Precautions:  Precautions Precautions: Fall, Back Precaution Comments: back for comfort Restrictions Weight Bearing Restrictions: No    Pain: Pain Assessment Pain Scale: 0-10 Pain Score: 5  Pain Type: Chronic pain Pain Location: Back Pain Orientation: Left Pain Descriptors / Indicators:  Aching Pain Onset: With Activity Patients Stated Pain Goal: 0 Pain Intervention(s): Medication (See eMAR)   Therapy/Group: Individual Therapy  AuLorie Phenix0/12/2021, 9:26 AM

## 2021-10-31 ENCOUNTER — Other Ambulatory Visit (HOSPITAL_COMMUNITY): Payer: Self-pay

## 2021-10-31 DIAGNOSIS — R29898 Other symptoms and signs involving the musculoskeletal system: Secondary | ICD-10-CM

## 2021-10-31 MED ORDER — ENSURE MAX PROTEIN PO LIQD: 11.0000 [oz_av] | Freq: Every day | ORAL | Status: DC

## 2021-10-31 MED ORDER — MAGNESIUM OXIDE 400 MG PO TABS
800.0000 mg | ORAL_TABLET | Freq: Two times a day (BID) | ORAL | 0 refills | Status: DC
  Filled 2021-10-31: qty 60, 15d supply, fill #0

## 2021-10-31 MED ORDER — LAMOTRIGINE 100 MG PO TABS
200.0000 mg | ORAL_TABLET | Freq: Every evening | ORAL | 0 refills | Status: DC
  Filled 2021-10-31: qty 30, 15d supply, fill #0

## 2021-10-31 MED ORDER — SENNOSIDES-DOCUSATE SODIUM 8.6-50 MG PO TABS
1.0000 | ORAL_TABLET | Freq: Two times a day (BID) | ORAL | 0 refills | Status: DC | PRN
  Filled 2021-10-31: qty 30, 15d supply, fill #0

## 2021-10-31 MED ORDER — FOLIC ACID 1 MG PO TABS
1.0000 mg | ORAL_TABLET | Freq: Every day | ORAL | 0 refills | Status: DC
  Filled 2021-10-31: qty 30, 30d supply, fill #0

## 2021-10-31 MED ORDER — SODIUM CHLORIDE 1 G PO TABS
1.0000 g | ORAL_TABLET | Freq: Four times a day (QID) | ORAL | 0 refills | Status: DC
  Filled 2021-10-31: qty 90, 23d supply, fill #0

## 2021-10-31 MED ORDER — VITAMIN B-6 100 MG PO TABS
100.0000 mg | ORAL_TABLET | Freq: Every day | ORAL | 0 refills | Status: DC
  Filled 2021-10-31: qty 30, 30d supply, fill #0

## 2021-10-31 MED ORDER — CITALOPRAM HYDROBROMIDE 10 MG PO TABS
10.0000 mg | ORAL_TABLET | Freq: Every day | ORAL | 0 refills | Status: DC
  Filled 2021-10-31: qty 30, 30d supply, fill #0

## 2021-10-31 MED ORDER — THIAMINE HCL 100 MG PO TABS
100.0000 mg | ORAL_TABLET | Freq: Every day | ORAL | 0 refills | Status: DC
  Filled 2021-10-31: qty 30, 30d supply, fill #0

## 2021-10-31 MED ORDER — MELATONIN 3 MG PO TABS
3.0000 mg | ORAL_TABLET | Freq: Every evening | ORAL | 0 refills | Status: DC | PRN
  Filled 2021-10-31: qty 30, 30d supply, fill #0

## 2021-10-31 MED ORDER — POLYETHYLENE GLYCOL 3350 17 GM/SCOOP PO POWD
17.0000 g | Freq: Two times a day (BID) | ORAL | 0 refills | Status: DC | PRN
  Filled 2021-10-31: qty 238, 7d supply, fill #0

## 2021-10-31 MED ORDER — HYDROCERIN EX CREA
1.0000 | TOPICAL_CREAM | Freq: Two times a day (BID) | CUTANEOUS | 0 refills | Status: DC
  Filled 2021-10-31: qty 454, 57d supply, fill #0

## 2021-10-31 MED ORDER — GABAPENTIN 300 MG PO CAPS
300.0000 mg | ORAL_CAPSULE | Freq: Two times a day (BID) | ORAL | 0 refills | Status: DC
  Filled 2021-10-31: qty 60, 30d supply, fill #0

## 2021-10-31 MED ORDER — DILTIAZEM HCL ER COATED BEADS 180 MG PO CP24
360.0000 mg | ORAL_CAPSULE | Freq: Every day | ORAL | 0 refills | Status: DC
  Filled 2021-10-31: qty 60, 30d supply, fill #0

## 2021-10-31 MED ORDER — ASPIRIN 81 MG PO TBEC
81.0000 mg | DELAYED_RELEASE_TABLET | Freq: Every day | ORAL | 0 refills | Status: AC
  Filled 2021-10-31: qty 30, 30d supply, fill #0

## 2021-10-31 MED ORDER — PANTOPRAZOLE SODIUM 40 MG PO TBEC
40.0000 mg | DELAYED_RELEASE_TABLET | Freq: Every day | ORAL | 0 refills | Status: DC
  Filled 2021-10-31: qty 30, 30d supply, fill #0

## 2021-10-31 MED ORDER — CITALOPRAM HYDROBROMIDE 10 MG PO TABS
10.0000 mg | ORAL_TABLET | Freq: Every day | ORAL | Status: DC
  Administered 2021-10-31: 10 mg via ORAL
  Filled 2021-10-31: qty 1

## 2021-10-31 NOTE — Progress Notes (Signed)
Physical Therapy Session Note  Patient Details  Name: Billy Anderson MRN: [id number] Date of Birth: 07-Sep-1965  Today's Date: 10/31/2021 PT Individual Time: 0900-1003 PT Individual Time Calculation (min): 63 min   Short Term Goals: Week 2:  PT Short Term Goal 1 (Week 2): STG=LTG due to ELOS.  Skilled Therapeutic Interventions/Progress Updates:      Therapy Documentation Precautions:  Precautions Precautions: Fall, Back Precaution Comments: back for comfort Restrictions Weight Bearing Restrictions: No  Pt received seated in recliner at bedside, without complaints of pain and agreeable to PT. Treatment with emphasis on  building UE/LE activity tolerance and reviewing HEP to prepare for discharge. Pt mod I with all mobility throughout session and ambulated to dayroom. Pt requested to warm up on NuStep and performed x 10 minutes at level 6 without rest breaks. PT engaged in discussion regarding creating walking program and home exercise program with emphasis on hip strength and balance exercises to decrease risk of falls. Pr reports he has limited space to walk at his house and in his neighborhood and PT recommended pt and spouse walk in community either at mall or track. PT created walking program to meet patient's individual needs and provided pt with blank calender to keep up with exercises. Pt provided pt with [place] fall prevention exercises that target balance and strength. Pt ambulated to room and left seated in recliner with all needs in reach.   Therapy/Group: Individual Therapy  Verl Dicker Verl Dicker PT, DPT  10/31/2021, 7:42 AM

## 2021-10-31 NOTE — TOC Progression Note (Signed)
Discharge meds from [facility] stored in [place]. Please bring white sheet from patient shadow chart to main pharmacy for pick up. - [place] 10/31/2021

## 2021-10-31 NOTE — Progress Notes (Signed)
Physical Therapy Session Note  Patient Details  Name: Billy Anderson MRN: [id number] Date of Birth: Aug 08, 1965  Today's Date: 10/31/2021 PT Individual Time: 1332-1400 PT Individual Time Calculation (min): 28 min   Short Term Goals: Week 1:  PT Short Term Goal 1 (Week 1): Patient will perform basic transfers with supervision consistently using LRAD. PT Short Term Goal 1 - Progress (Week 1): Met PT Short Term Goal 2 (Week 1): Patient will improve score on Berg Balance Scale by 7 points to meet MCID for reduced fall risk. PT Short Term Goal 2 - Progress (Week 1): Met PT Short Term Goal 3 (Week 1): Patient will ambulate with supervision >400 feet using LRAD. PT Short Term Goal 3 - Progress (Week 1): Met Week 2:  PT Short Term Goal 1 (Week 2): STG=LTG due to ELOS.  Skilled Therapeutic Interventions/Progress Updates:  Patient seated upright in recliner on entrance to room. Patient alert and agreeable to PT session.   Patient with no pain complaint at start of session. Relates need to practice getting on/ off low couch seat, need to perform car transfer, and worried re: gait over uneven ground as pt has gravel driveway.   Therapeutic Activity: Transfers: Pt performed sit<>stand and stand pivot transfers throughout session with Mod I. In ADL apartment, pt is able to complete transfers to low couch seat with supervision initially and then Mod I throughout demonstrating good technique learned recently. Car transfer performed x2 with Mod I and good ability to park rollator at "back door" for driver to tend to, then use car to stabilize and get in correctly with pivot while seated to bring Les in/ out.   Pt worried re: being able to get up from floor as primary therapist attempted to have pt perform floor transfer but pt with difficulty bringing one foot under in order to kneel on one LE and push up onto other. Unfortunately, out of time in session but visually demonstrated to pt that once quadruped,  crawl to couch, bring arms onto seat of couch, bring outer LE out to pt's side enough to extend and push hips up and onto couch so he is sidelying on couch with feet on floor. Then take a break, breath, regroup and push UB to seated postion. From here pt has demonstrated ability to stand from seated position. Demonstrated again per pt request and pt relating that he feels that he could perform this as needed.    Gait Training:  Pt ambulated >300 ft x2 using rollator with Mod I/ supervision for fatigue. Also able to walk through mulch with rollator demonstrating ability to control rollator over uneven surface. Demonstrated good technique to bring rollator down from step and up onto step into mulch pit. Cues not required for safe technique.   Patient seated upright in recliner at end of session with brakes locked, no alarm set as pt Mod I in room, and all needs within reach.   Therapy Documentation Precautions:  Precautions Precautions: Fall, Back Precaution Comments: back for comfort Restrictions Weight Bearing Restrictions: No General:   Vital Signs:   Pain:  No pain related this session.  Therapy/Group: Individual Therapy  Alger Simons PT, DPT, CSRS 10/31/2021, 12:38 PM

## 2021-10-31 NOTE — Progress Notes (Addendum)
PROGRESS NOTE   Subjective/Complaints: Pt without new issues. Has outing today to HT.  ROS: Patient denies fever, rash, sore throat, blurred vision, dizziness, nausea, vomiting, diarrhea, cough, shortness of breath or chest pain, joint or back/neck pain, headache, or mood change.    Objective:   No results found. No results for input(s): "WBC", "HGB", "HCT", "PLT" in the last 72 hours.  No results for input(s): "NA", "K", "CL", "CO2", "GLUCOSE", "BUN", "CREATININE", "CALCIUM" in the last 72 hours.   Intake/Output Summary (Last 24 hours) at 10/31/2021 1030 Last data filed at 10/31/2021 0831 Gross per 24 hour  Intake 944 ml  Output 775 ml  Net 169 ml         Physical Exam: Vital Signs Blood pressure 120/65, pulse 64, temperature 98.4 F (36.9 C), resp. rate 16, height 6\' 1"  (1.854 m), weight 83.1 kg, SpO2 98 %.  Constitutional: No distress . Vital signs reviewed. HEENT: NCAT, EOMI, oral membranes moist Neck: supple Cardiovascular: RRR without murmur. No JVD    Respiratory/Chest: CTA Bilaterally without wheezes or rales. Normal effort    GI/Abdomen: BS +, non-tender, non-distended Ext: no clubbing, cyanosis, or edema Psych: pleasant and cooperative  Skin: buttock wounds resolved. Small petechiae on forearms Neuro:  Alert and oriented x 3. Follows commands. Normal insight and awareness. Intact Memory. Normal language and speech. Cranial nerve exam unremarkable. Motor: 5/5 UE (except right shoulder), LE: 4HF, KE and 4+/5 ADF/PF--stable to improved. No obvious sensory findings. DTR's trace in LE's. No resting tone. Musculoskeletal:  Can't perform initial 20-30 deg shoulder abduction on right. .      Assessment/Plan: 1. Functional deficits which require 3+ hours per day of interdisciplinary therapy in a comprehensive inpatient rehab setting. Physiatrist is providing close team supervision and 24 hour management of  active medical problems listed below. Physiatrist and rehab team continue to assess barriers to discharge/monitor patient progress toward functional and medical goals  Care Tool:  Bathing    Body parts bathed by patient: Right arm, Left arm, Chest, Abdomen, Front perineal area, Buttocks, Right upper leg, Left upper leg, Right lower leg, Left lower leg, Face         Bathing assist Assist Level: Independent with assistive device     Upper Body Dressing/Undressing Upper body dressing   What is the patient wearing?: Pull over shirt    Upper body assist Assist Level: Independent with assistive device    Lower Body Dressing/Undressing Lower body dressing      What is the patient wearing?: Pants     Lower body assist Assist for lower body dressing: Independent with assitive device     Toileting Toileting    Toileting assist Assist for toileting: Independent with assistive device Assistive Device Comment: sit down rolling walker   Transfers Chair/bed transfer  Transfers assist     Chair/bed transfer assist level: Independent with assistive device Chair/bed transfer assistive device: Museum/gallery exhibitions officer assist      Assist level: Supervision/Verbal cueing Assistive device: Rollator Max distance: 921   Walk 10 feet activity   Assist     Assist level: Independent with assistive device Assistive device: Rollator  Walk 50 feet activity   Assist    Assist level: Independent with assistive device Assistive device: Rollator    Walk 150 feet activity   Assist    Assist level: Supervision/Verbal cueing Assistive device: Rollator    Walk 10 feet on uneven surface  activity   Assist     Assist level: Supervision/Verbal cueing Assistive device: Walker-rolling   Wheelchair     Assist Is the patient using a wheelchair?: No Type of Wheelchair: Manual    Wheelchair assist level: Supervision/Verbal cueing Max  wheelchair distance: >150 ft    Wheelchair 50 feet with 2 turns activity    Assist        Assist Level: Supervision/Verbal cueing   Wheelchair 150 feet activity     Assist      Assist Level: Supervision/Verbal cueing   Blood pressure 120/65, pulse 64, temperature 98.4 F (36.9 C), resp. rate 16, height 6\' 1"  (1.854 m), weight 83.1 kg, SpO2 98 %.  Medical Problem List and Plan: 1. Functional deficits secondary to ambulatory dysfunction/lumbar spinal stenosis/close compression fracture L5 lumbar vertebra             -patient may shower             -ELOS/Goals: 10/14, PT/OT supervision goals           -Continue CIR therapies including PT, OT, community outing today  -finalize dc plannng  Will reach out to his wife re: any questions she has 2.  Antithrombotics: -DVT/anticoagulation:  Mechanical: Antiembolism stockings, thigh (TED hose) Bilateral lower extremities             -antiplatelet therapy: baby ASA  - small petechiae may be coming from aspirin, +/- ETOH 3. Pain Management/chronic back pain: Neurontin 300 mg twice daily 4. Anxiety/bipolar disorder/Sleep: Melatonin 10 mg nightly as needed.             -antipsychotic agents: Zyprexa 20 mg nightly, Lamictal 200 mg nightly  -affect normal but patient suffering from significant depression prior to arrival. Had been on antidepressant before but for whatever reason, this had fallen off.    -will begin celexa 10mg  qhs   -discuss psychiatry follow up as outpt (had been seen in [facility] system before) 5. Neuropsych/cognition: This patient is capable of making decisions on his own behalf. 6. Skin/Wound Care: buttock wounds essentially healed--can dc foam dressings  -pressure relief, nutrition  7. Fluids/Electrolytes/Nutrition: encourage PO  -albumin low--add protein supp  -sodium up to 138 10/9   -dc'ed salt tabs 8.  PAF.  Not on anticoagulation prior to admission.  Cardiac rate controlled.  Cardizem 360 mg daily.  Aspirin  currently on hold.  HR controlled  9.  Alcohol/tobacco abuse.  Provide counseling.  Off CIWA, Continue thiamine as well as folic acid.  Patient on Depade 50 mg daily prior to admission.  NicoDerm patch ongoing 10.  Transaminitis/hyperbilirubinemia.  Pattern consistent with alcohol.  CK normalized. 11.  Hypertension.  Continue Cardizem.  Lisinopril held due to hyponatremia.    -10/13 bp controlled 12.  Elevated AST.  CK within normal limits.  Likely from alcohol.    10/5 WNL 13.  Chronic thrombocytopenia:  Likely due to alcohol abuse.      -platelets up to 368 14.  Positive Hemoccult/chronic diarrhea.  H&H stable.  No overt bleeding.  Outpatient follow-up GI. Diarrhea has improved.    Continue PPI  10/5 hgb up to 12.6. follow up on Monday   10/7 last BM  Scheduled Senokot to prevent constipation  10/13 had bm 10/12 after sorbitol 15.  GERD.  Continue Protonix 16.  History of seizure disorder.  Continue Lamictal 17. Hypomagnesemia. 2.0 10/9 -Continue Mag ox 18. Seizure disorder:  -pt has not had a seizure since 10/22  -he remains on lamictal as prescribed  -10/12 had another serious talk today about ETOH use  -I will hold off on clearance for driving based on behavioral history I've been told about.  19. Right shoulder weakness  -states he might have injured it about a year ago  -appears to be right supraspinatus injury, likely complete tear  -consider MRI as outpt. Surgical options are probably pretty limited at this point      LOS: 9 days A FACE TO New Site 10/31/2021, 10:30 AM

## 2021-10-31 NOTE — Progress Notes (Addendum)
Inpatient Rehabilitation Discharge Medication Review by a Pharmacist  A complete drug regimen review was completed for this patient to identify any potential clinically significant medication issues.  High Risk Drug Classes Is patient taking? Indication by Medication  Antipsychotic Yes Olanzapine (zyprexa) - Bipolar disorder  Anticoagulant No   Antibiotic No   Opioid No   Antiplatelet Yes Aspirin-stroke prophylaxis  Hypoglycemics/insulin No   Vasoactive Medication Yes Diltiazem- HTN, Afib  Chemotherapy No   Other Yes Gabapentin- chronic pain Lamotrigine-seizure and bipolar Nicotine patch-smoking cessation Protonix-GERD Melatonin-sleep Allegra- allergy Calcium/vitD, folic acid, MVI, vitB6, thiamine,Magnesium oxide -vitamin supplements Voltaren- pain Citalopram -depression Senokot, miralax- bowel regimen      Type of Medication Issue Identified Description of Issue Recommendation(s)  Drug Interaction(s) (clinically significant)     Duplicate Therapy     Allergy     No Medication Administration End Date     Incorrect Dose     Additional Drug Therapy Needed     Significant med changes from prior encounter (inform family/care partners about these prior to discharge). Lisinopril, naltrexone, Humira SQ, prior to admission meds discontinued. Sodium chloride tabs discontinued Inform patient/family   Other       Clinically significant medication issues were identified that warrant physician communication and completion of prescribed/recommended actions by midnight of the next day:  No  Name of provider notified for urgent issues identified:   Provider Method of Notification:    Pharmacist comments:   Time spent performing this drug regimen review (minutes):  15   Thank you for allowing [place] to participate in this patients care. Jens Som, PharmD 10/31/2021 3:09 PM  **Pharmacist phone directory can be found on [email or web address] listed under [place]**

## 2021-10-31 NOTE — Progress Notes (Signed)
Inpatient Rehabilitation Care Coordinator Discharge Note   Patient Details  Name: Billy Anderson MRN: [id number] Date of Birth: 1965/05/21   Discharge location: D/c to home with his wife  Length of Stay: 9 days  Discharge activity level: ambulatory level Modified Independent with rollator for household mobility and supervision with RW for community mobility  Home/community participation: Limited  Patient response OE:HOZYYQ Literacy - How often do you need to have someone help you when you read instructions, pamphlets, or other written material from your doctor or pharmacy?: Never  Patient response MG:NOIBBC Isolation - How often do you feel lonely or isolated from those around you?: Patient unable to respond  Services provided included: MD, RD, PT, CM, TR, Pharmacy, Neuropsych, SW, RN, SLP, OT  Financial Services:  Financial Services Utilized: Medicaid    Choices offered to/list presented to: Yes  Follow-up services arranged:  Patient/Family has no preference for HH/DME agencies, Other (Comment) (HHPT/OT/SLP referral sent out to [facility] and waiting on follow-up. Pt is aware there will be follow-up on MOnday to provide updates on HHA progress as pt prefers to begin to [facility] vs OPT.)           Patient response to transportation need: Is the patient able to respond to transportation needs?: Yes In the past 12 months, has lack of transportation kept you from medical appointments or from getting medications?: No In the past 12 months, has lack of transportation kept you from meetings, work, or from getting things needed for daily living?: Patient refused   Comments (or additional information):  Patient/Family verbalized understanding of follow-up arrangements:  Yes  Individual responsible for coordination of the follow-up plan: contact pt or pt wife Billy Anderson  Confirmed correct DME delivered: Billy Anderson 10/31/2021    Billy Anderson

## 2021-10-31 NOTE — Progress Notes (Signed)
Occupational Therapy Discharge Summary  Patient Details  Name: Billy Anderson MRN: [id number] Date of Birth: 1965/07/10  Today's Date: 10/31/2021 OT Individual Time: 1000-1100 OT Individual Time Calculation (min): 60 min   Pt received in recliner with 5 out of 10 pain in back. Called LPN for tylenol provided for pain relief  ADL: Pt completes ADL at overall MOD I Level. Skilled interventions include: reinforcing seated doffing of clothing at home as pt does not have grab bar by toilet like hospital room set up therefore should not stand on 1LE without support. Overall safe transitional movements with BSC over toilet and Rolator used for gathering and transporting clothing items. Pt also educated on using BSC bucket under feet when sitting on high surfaces to assist with access to feet for footwear to decrease bending/energy expenditure.   Therapeutic activity 2x12 stairs for BLE strengthening, activity tolerance and practice for functional mobility at home. Pt able to complete with setup and no cuing. Seated rest break in between. Pt able ot sit on Rolator with cue to lock breaks and pet therapy dog. Pt smiling and engaing with dog.   Pt left at end of session in Ortho gym for next tx session  Session 2: Today's Date: 10/31/2021 OT Concurrent Time: 1100-1200 OT Concurrent Time Calculation (min): 60 min  Pain reported during session as 3/10. Tylenol in prior session provided for pain relief. Patient actively participated in the [place] program in a concurrent setting for social participation. Session focused on education and training in breathing techniques to regulate the nervous system, gentle yoga poses with a focus on core stability, back/leg ROM stretching and sdynamic unsupported sitting balance, guided meditation to regulate attention and guided discussion on the topic of the feeling of wholeness. Skilled treatment interventions include: grading poses up and down for pt  specific factors, educating on how this program could be used at home for pain relief/relaxation, and cued for opportunities to share when comfortable in discussion at end of session to help with coping with change Patient required mod cues and superviison assist for all seated postures with directional cues throughout. Pt able ot state that he felt like he would use the arm stretches at home to help his roator cuff. Exited session with pt seated in recliner, no exit alarm on and call light in reach    Date of Discharge from [place] 13, 2023   Patient has met 11 of 11 long term goals due to improved activity tolerance, improved balance, postural control, ability to compensate for deficits, and improved coordination. Billy Anderson has done well in rehab fully participating in tx. Patient to discharge at overall Modified Independent level.  Billy Anderson, pts partner participated in family training to review mobility strategies, safety with ADLs and pts CLOF. Pt needs to continue to maintain this level of activity at home to keep up strength and endurance. Pt and partner aware. Pt continues to demo memory and occasional decreased awareness of balance deficits with IADL activities, but partner is able to provide this A at home. Patient's care partner is independent to provide the necessary  intermittent supervision  assistance at discharge.    Reasons goals not met: n/a  Recommendation:  Patient will benefit from ongoing skilled OT services in home health setting to continue to advance functional skills in the area of BADL, iADL, and Reduce care partner burden.  Equipment: BSC  Reasons for discharge: treatment goals met and discharge from hospital  Patient/family agrees with progress made and  goals achieved: Yes  OT Discharge Precautions/Restrictions    General   Vital Signs Therapy Vitals Temp: 98.4 F (36.9 C) Pulse Rate: 64 Resp: 16 BP: 120/65 Patient Position (if appropriate):  Lying Oxygen Therapy SpO2: 98 % O2 Device: Room Air ADL  ADL Eating: Independent Where Assessed-Eating: Chair Grooming: Modified independent Where Assessed-Grooming: Standing at sink Upper Body Bathing: Modified independent Where Assessed-Upper Body Bathing: Chair Lower Body Bathing: Modified independent Where Assessed-Lower Body Bathing: Chair Upper Body Dressing: Modified independent (Device) Where Assessed-Upper Body Dressing: Chair Lower Body Dressing: Modified independent Where Assessed-Lower Body Dressing: Chair Toileting: Modified independent Where Assessed-Toileting: Medical laboratory scientific officer: Modified independent Armed forces technical officer Method: Magazine features editor: Close supervision Social research officer, government Method: Heritage manager: Other (comment) (padded toilet seat) Vision/Perception  Vision - History Ability to See in Adequate Light: 0 Adequate Perception Perception: Within Functional Limits Praxis Praxis: Intact  Cognition (see flowsheet for BIMS) Overall Cognitive Status: History of cognitive impairments - at baseline (patient's wife reports pt with decreased memory and recall of events since seizures >2 years ago, states patient is currently at baseline) Arousal/Alertness: Awake/alert Orientation Level: Oriented X4 Year: 2023 Month: October Day of Week: Correct Memory: Impaired Memory Impairment: Retrieval deficit;Decreased long term memory Decreased Short Term Memory: Verbal complex Awareness: Appears intact Problem Solving: Appears intact Safety/Judgment: Appears intact Comments: Requires continued education/support for alcohol cessation and behavior modification Sensation Sensation Light Touch: Appears Intact (baseline sensory deficits along L ulnar nerve distribution) Hot/Cold: Appears Intact Proprioception: Impaired by gross assessment (B lower extremities) Coordination Gross Motor Movements are Fluid and Coordinated:  No Fine Motor Movements are Fluid and Coordinated: No Coordination and Movement Description: decreased postural control, motor control, and proprioception of B lower extremities, limited ROM and strenght due to old R shoulder injury Heel Shin Test: slow and deliberate with dysmetria bilaterally Motor  Motor Motor: Abnormal postural alignment and control;Paraplegia  Mobility Bed Mobility Bed Mobility: Supine to Sit;Rolling Right;Rolling Left;Sit to Supine Rolling Right: Independent Rolling Left: Independent Supine to Sit: Independent Sit to Supine: Independent Transfers Transfers: Sit to Stand;Stand to Sit;Stand Pivot Transfers Sit to Stand: Independent with assistive device Stand to Sit: Independent with assistive device Stand Pivot Transfers: Independent with assistive device Transfer (Assistive device): 4-wheeled walker (or RW) Trunk/Postural Assessment  Cervical Assessment Cervical Assessment: Exceptions to [facility] (head forward) Thoracic Assessment Thoracic Assessment: Exceptions to [facility] (rounded shoudlers) Lumbar Assessment Lumbar Assessment: Exceptions to [facility] (posterior pelvic tilt) Postural Control Postural Control: Deficits on evaluation (delayed/reliant on external aides- grab bars/walker)  Balance Standardized Balance Assessment Standardized Balance Assessment: Berg Balance Test Berg Balance Test Sit to Stand: Able to stand  independently using hands Standing Unsupported: Able to stand 2 minutes with supervision Sitting with Back Unsupported but Feet Supported on Floor or Stool: Able to sit safely and securely 2 minutes Stand to Sit: Controls descent by using hands Transfers: Able to transfer safely, definite need of hands Standing Unsupported with Eyes Closed: Able to stand 10 seconds with supervision Standing Ubsupported with Feet Together: Needs help to attain position but able to stand for 30 seconds with feet together From Standing, Reach Forward with Outstretched  Arm: Reaches forward but needs supervision From Standing Position, Pick up Object from Floor: Able to pick up shoe, needs supervision From Standing Position, Turn to Look Behind Over each Shoulder: Needs supervision when turning Turn 360 Degrees: Needs assistance while turning Standing Unsupported, Alternately Place Feet on Step/Stool: Needs assistance to keep from falling or unable  to try Standing Unsupported, One Foot in Front: Needs help to step but can hold 15 seconds Standing on One Leg: Unable to try or needs assist to prevent fall Total Score: 26 Dynamic Sitting Balance Dynamic Sitting - Balance Support: During functional activity;Feet supported Dynamic Sitting - Level of Assistance: 7: Independent Static Standing Balance Static Standing - Balance Support: During functional activity;Bilateral upper extremity supported Static Standing - Level of Assistance: 6: Modified independent (Device/Increase time) Dynamic Standing Balance Dynamic Standing - Balance Support: Right upper extremity supported;Left upper extremity supported;During functional activity Dynamic Standing - Level of Assistance: 6: Modified independent (Device/Increase time) Extremity/Trunk Assessment   RUE: supraspinatus tear at baseline, Pt uses funcitonally in ADL with LUE to assist higher reach   LUE generalized weakness ROM 0-100 shoulder flexion   Lowella Dell Malissie Musgrave 10/31/2021, 7:01 AM

## 2021-10-31 NOTE — Progress Notes (Signed)
Patient ID: Keltin Baird III, male   DOB: [birth year], 56 y.o.   MRN: [id number]  SW returned phone call to pt wife to discuss questions and concerns. She has a lot of questions related to pt medical care, and would like to speak with the attending. When discussing if she prefers outpatient or [place] based on PT updates stating wife prefers [place]. Wife stated to ask patient. SW relayed concerns to medical physicians.   *SW left message for pt wife informing that pt was unable to be accepted by [facility], and we can schedule with another provider, and they are welcome to change providers if needed.   SW scheduled new patient appointment/hospital follow-up with [facility] for 10/16 @ 11am with Charlyn Minerva, FNP.   SW met with pt in room to discuss above. He prefers HH. No HHA preference. SW informed pt there will likely not be an answer before end of day, and if not, will inform on Monday with updates.   SW sent HHPT/OT/SLP referral to [facility] and waiting on follow-up.   Loralee Pacas, MSW, [place] Office: [phone] Cell: [phone] Fax: [phone]

## 2021-10-31 NOTE — Progress Notes (Signed)
Discharge instructions reviewed with patient and wife. Reiterated  NO ALCOHOL and NO DRIVING.

## 2021-11-01 NOTE — Progress Notes (Signed)
PROGRESS NOTE   Subjective/Complaints: No new complaints this morning He feels ready for discharge today He is waiting on his family to pick him up Appreciate Pam reviewing discharge instructions yesterday  ROS: Patient denies fever, rash, sore throat, blurred vision, dizziness, nausea, vomiting, diarrhea, cough, shortness of breath or chest pain, joint or back/neck pain, headache, or mood change.    Objective:   No results found. No results for input(s): "WBC", "HGB", "HCT", "PLT" in the last 72 hours.  No results for input(s): "NA", "K", "CL", "CO2", "GLUCOSE", "BUN", "CREATININE", "CALCIUM" in the last 72 hours.   Intake/Output Summary (Last 24 hours) at 11/01/2021 1253 Last data filed at 11/01/2021 0900 Gross per 24 hour  Intake 1355 ml  Output 1050 ml  Net 305 ml         Physical Exam: Vital Signs Blood pressure 125/83, pulse 86, temperature 98.2 F (36.8 C), resp. rate 16, height 6\' 1"  (1.854 m), weight 83.1 kg, SpO2 97 %.  Gen: no distress, normal appearing HEENT: oral mucosa pink and moist, NCAT Cardio: Reg rate Chest: normal effort, normal rate of breathing Abd: soft, non-distended Ext: no edema Psych: pleasant, normal affect  Skin: buttock wounds resolved. Small petechiae on forearms Neuro:  Alert and oriented x 3. Follows commands. Normal insight and awareness. Intact Memory. Normal language and speech. Cranial nerve exam unremarkable. Motor: 5/5 UE (except right shoulder), LE: 4HF, KE and 4+/5 ADF/PF--stable to improved. No obvious sensory findings. DTR's trace in LE's. No resting tone. Musculoskeletal:  Can't perform initial 20-30 deg shoulder abduction on right. .      Assessment/Plan: 1. Functional deficits which require 3+ hours per day of interdisciplinary therapy in a comprehensive inpatient rehab setting. Physiatrist is providing close team supervision and 24 hour management of active  medical problems listed below. Physiatrist and rehab team continue to assess barriers to discharge/monitor patient progress toward functional and medical goals  Care Tool:  Bathing    Body parts bathed by patient: Right arm, Left arm, Chest, Abdomen, Front perineal area, Buttocks, Right upper leg, Left upper leg, Right lower leg, Left lower leg, Face         Bathing assist Assist Level: Independent with assistive device     Upper Body Dressing/Undressing Upper body dressing   What is the patient wearing?: Pull over shirt    Upper body assist Assist Level: Independent with assistive device    Lower Body Dressing/Undressing Lower body dressing      What is the patient wearing?: Pants     Lower body assist Assist for lower body dressing: Independent with assitive device     Toileting Toileting    Toileting assist Assist for toileting: Independent with assistive device Assistive Device Comment: sit down rolling walker   Transfers Chair/bed transfer  Transfers assist     Chair/bed transfer assist level: Independent with assistive device Chair/bed transfer assistive device: Museum/gallery exhibitions officer assist      Assist level: Supervision/Verbal cueing Assistive device: Rollator Max distance: 921   Walk 10 feet activity   Assist     Assist level: Independent with assistive device Assistive device: Rollator  Walk 50 feet activity   Assist    Assist level: Independent with assistive device Assistive device: Rollator    Walk 150 feet activity   Assist    Assist level: Supervision/Verbal cueing Assistive device: Rollator    Walk 10 feet on uneven surface  activity   Assist     Assist level: Supervision/Verbal cueing Assistive device: Walker-rolling   Wheelchair     Assist Is the patient using a wheelchair?: No Type of Wheelchair: Manual    Wheelchair assist level: Supervision/Verbal cueing Max wheelchair  distance: >150 ft    Wheelchair 50 feet with 2 turns activity    Assist        Assist Level: Supervision/Verbal cueing   Wheelchair 150 feet activity     Assist      Assist Level: Supervision/Verbal cueing   Blood pressure 125/83, pulse 86, temperature 98.2 F (36.8 C), resp. rate 16, height 6\' 1"  (1.854 m), weight 83.1 kg, SpO2 97 %.  Medical Problem List and Plan: 1. Functional deficits secondary to ambulatory dysfunction/lumbar spinal stenosis/close compression fracture L5 lumbar vertebra             -patient may shower             -ELOS/Goals: 10/14, PT/OT supervision goals           -d/c home today  Will reach out to his wife re: any questions she has 2.  Antithrombotics: -DVT/anticoagulation:  Mechanical: Antiembolism stockings, thigh (TED hose) Bilateral lower extremities             -antiplatelet therapy: baby ASA  - small petechiae may be coming from aspirin, +/- ETOH 3. Pain Management/chronic back pain: continue Neurontin 300 mg twice daily 4. Anxiety/bipolar disorder/Sleep: continue Melatonin 10 mg nightly as needed.             -antipsychotic agents: Zyprexa 20 mg nightly, Lamictal 200 mg nightly  -affect normal but patient suffering from significant depression prior to arrival. Had been on antidepressant before but for whatever reason, this had fallen off.    -will begin celexa 10mg  qhs   -discuss psychiatry follow up as outpt (had been seen in [facility] system before) 5. Neuropsych/cognition: This patient is capable of making decisions on his own behalf. 6. Skin/Wound Care: buttock wounds essentially healed--can dc foam dressings  -pressure relief, nutrition  7. Fluids/Electrolytes/Nutrition: encourage PO  -albumin low--add protein supp  -sodium up to 138 10/9   -dc'ed salt tabs 8.  PAF.  Not on anticoagulation prior to admission.  Cardiac rate controlled.  continue Cardizem 360 mg daily.  Aspirin currently on hold.  HR controlled  9.  Alcohol/tobacco  abuse.  Provide counseling.  Off CIWA, Continue thiamine as well as folic acid.  Patient on Depade 50 mg daily prior to admission.  NicoDerm patch ongoing 10.  Transaminitis/hyperbilirubinemia.  Pattern consistent with alcohol.  CK normalized. 11.  Hypertension.  Continue Cardizem.  Lisinopril held due to hyponatremia.    -10/13 bp controlled 12.  Elevated AST.  CK within normal limits.  Likely from alcohol.    10/5 WNL 13.  Chronic thrombocytopenia:  Likely due to alcohol abuse.      -platelets up to 368 14.  Positive Hemoccult/chronic diarrhea.  H&H stable.  No overt bleeding.  Outpatient follow-up GI. Diarrhea has improved.    Continue PPI  10/5 hgb up to 12.6. follow up on Monday   10/7 last BM   Scheduled Senokot to prevent constipation  10/13  had bm 10/12 after sorbitol 15.  GERD.  Continue Protonix 16.  History of seizure disorder.  Continue Lamictal 17. Hypomagnesemia. 2.0 10/9 -Continue Mag ox 18. Seizure disorder:  -pt has not had a seizure since 10/22  -he remains on lamictal as prescribed  -10/12 had another serious talk today about ETOH use  -I will hold off on clearance for driving based on behavioral history I've been told about.  19. Right shoulder weakness  -states he might have injured it about a year ago  -appears to be right supraspinatus injury, likely complete tear  -consider MRI as outpt. Surgical options are probably pretty limited at this point   >30 minutes spent in discharge of patient including review of medications and follow-up appointments, physical examination, and in answering all patient's questions       LOS: 10 days A FACE TO FACE EVALUATION WAS [place] 11/01/2021, 12:53 PM

## 2021-11-01 NOTE — Plan of Care (Addendum)
  Problem: Sit to Stand Goal: LTG:  Patient will perform sit to stand with assistance level (PT) Description: LTG:  Patient will perform sit to stand with assistance level (PT) Outcome: Completed/Met Flowsheets (Taken 10/23/2021 1227 by Grunenberg, Cherie L, PT) LTG: PT will perform sit to stand in preparation for functional mobility with assistance level: Independent with assistive device   Problem: RH Bed Mobility Goal: LTG Patient will perform bed mobility with assist (PT) Description: LTG: Patient will perform bed mobility with assistance, with/without cues (PT). Outcome: Completed/Met Flowsheets (Taken 10/23/2021 1227 by Grunenberg, Cherie L, PT) LTG: Pt will perform bed mobility with assistance level of: Independent   Problem: RH Bed to Chair Transfers Goal: LTG Patient will perform bed/chair transfers w/assist (PT) Description: LTG: Patient will perform bed to chair transfers with assistance (PT). Outcome: Completed/Met Flowsheets (Taken 10/23/2021 1227 by Grunenberg, Cherie L, PT) LTG: Pt will perform Bed to Chair Transfers with assistance level: Independent with assistive device    Problem: RH Car Transfers Goal: LTG Patient will perform car transfers with assist (PT) Description: LTG: Patient will perform car transfers with assistance (PT). Outcome: Completed/Met Flowsheets (Taken 10/23/2021 1227 by Grunenberg, Cherie L, PT) LTG: Pt will perform car transfers with assist:: Supervision/Verbal cueing   Problem: RH Ambulation Goal: LTG Patient will ambulate in controlled environment (PT) Description: LTG: Patient will ambulate in a controlled environment, # of feet with assistance (PT). Outcome: Completed/Met Flowsheets (Taken 10/23/2021 1227 by Grunenberg, Cherie L, PT) LTG: Pt will ambulate in controlled environ  assist needed:: Supervision/Verbal cueing LTG: Ambulation distance in controlled environment: >500 ft using LRAD Goal: LTG Patient will ambulate in home environment  (PT) Description: LTG: Patient will ambulate in home environment, # of feet with assistance (PT). Outcome: Completed/Met Flowsheets (Taken 10/23/2021 1227 by Grunenberg, Cherie L, PT) LTG: Pt will ambulate in home environ  assist needed:: Independent with assistive device LTG: Ambulation distance in home environment: 50 ft using LRAD Goal: LTG Patient will ambulate in community environment (PT) Description: LTG: Patient will ambulate in community environment, # of feet with assistance (PT). Outcome: Completed/Met Flowsheets (Taken 10/23/2021 1227 by Grunenberg, Cherie L, PT) LTG: Pt will ambulate in community environ  assist needed:: Supervision/Verbal cueing LTG: Ambulation distance in community environment: 150 ft using LRAD   Problem: RH Stairs Goal: LTG Patient will ambulate up and down stairs w/assist (PT) Description: LTG: Patient will ambulate up and down # of stairs with assistance (PT) Outcome: Completed/Met Flowsheets (Taken 10/23/2021 1227 by Grunenberg, Cherie L, PT) LTG: Pt will ambulate up/down stairs assist needed:: Supervision/Verbal cueing LTG: Pt will  ambulate up and down number of stairs: 5 steps using 1 rail for home entry    Problem: RH Floor Transfers Goal: LTG Patient will perform floor transfers w/assist (PT) Description: LTG: Patient will perform floor transfers with assistance (PT). Outcome: Not Met Flowsheets (Taken 10/23/2021 1227 by Grunenberg, Cherie L, PT) LTG: PT WILL PERFORM FLOOR TRANFERS  WITH  ASSIST:: Minimal Assistance - Patient > 75%  Pt requires ModA at time of d/c but demonstrates understanding of modified floor transfer using couch. To be practiced with OPPT.

## 2021-11-03 ENCOUNTER — Ambulatory Visit (INDEPENDENT_AMBULATORY_CARE_PROVIDER_SITE_OTHER): Payer: Medicaid Other | Admitting: Nurse Practitioner

## 2021-11-03 ENCOUNTER — Telehealth: Payer: Self-pay

## 2021-11-03 ENCOUNTER — Encounter: Payer: Self-pay | Admitting: Nurse Practitioner

## 2021-11-03 VITALS — BP 116/59 | HR 88 | Temp 98.3°F | Ht 73.0 in | Wt 177.6 lb

## 2021-11-03 DIAGNOSIS — L4050 Arthropathic psoriasis, unspecified: Secondary | ICD-10-CM

## 2021-11-03 DIAGNOSIS — M545 Low back pain, unspecified: Secondary | ICD-10-CM | POA: Diagnosis not present

## 2021-11-03 DIAGNOSIS — D696 Thrombocytopenia, unspecified: Secondary | ICD-10-CM

## 2021-11-03 DIAGNOSIS — F1021 Alcohol dependence, in remission: Secondary | ICD-10-CM

## 2021-11-03 DIAGNOSIS — I480 Paroxysmal atrial fibrillation: Secondary | ICD-10-CM

## 2021-11-03 DIAGNOSIS — I10 Essential (primary) hypertension: Secondary | ICD-10-CM

## 2021-11-03 DIAGNOSIS — F17200 Nicotine dependence, unspecified, uncomplicated: Secondary | ICD-10-CM

## 2021-11-03 DIAGNOSIS — R569 Unspecified convulsions: Secondary | ICD-10-CM | POA: Insufficient documentation

## 2021-11-03 DIAGNOSIS — Z09 Encounter for follow-up examination after completed treatment for conditions other than malignant neoplasm: Secondary | ICD-10-CM

## 2021-11-03 DIAGNOSIS — F3110 Bipolar disorder, current episode manic without psychotic features, unspecified: Secondary | ICD-10-CM

## 2021-11-03 DIAGNOSIS — E871 Hypo-osmolality and hyponatremia: Secondary | ICD-10-CM

## 2021-11-03 DIAGNOSIS — G8929 Other chronic pain: Secondary | ICD-10-CM

## 2021-11-03 MED ORDER — SODIUM CHLORIDE 1 G PO TABS: 1.0000 g | ORAL_TABLET | Freq: Three times a day (TID) | ORAL | 0 refills | Status: DC

## 2021-11-03 NOTE — Assessment & Plan Note (Signed)
Lab Results  Component Value Date   NA 138 10/27/2021   K 4.8 10/27/2021   CO2 28 10/27/2021   GLUCOSE 99 10/27/2021   BUN 17 10/27/2021   CREATININE 0.78 10/27/2021   CALCIUM 9.4 10/27/2021   GFRNONAA >60 10/27/2021  Has history of hyponatremia Sodium tablets 1 g 3 times daily reordered We will check sodium level in 1 week

## 2021-11-03 NOTE — Assessment & Plan Note (Addendum)
Chronic condition currently on gabapentin 300 mg twice daily, able to ambulate with walker  Referral to neurosurgery is pending. Patient encouraged to engage in regular daily exercise as tolerated, continue to use walker to prevent falls

## 2021-11-03 NOTE — Assessment & Plan Note (Signed)
Humira was discontinued while he was in the hospital  Patient referred to dermatology

## 2021-11-03 NOTE — Assessment & Plan Note (Signed)
Currently on olanzapine 20 mg daily, Celexa 10 mg daily, denies SI, HI Followed by psychiatrist would like referral to a new psychiatry locally. Referral to psych placed today

## 2021-11-03 NOTE — Patient Instructions (Addendum)
Please take sodium 1g three times daily, labs in one week   It is important that you exercise regularly at least 30 minutes 5 times a week as tolerated  Think about what you will eat, plan ahead. Choose " clean, green, fresh or frozen" over canned, processed or packaged foods which are more sugary, salty and fatty. 70 to 75% of food eaten should be vegetables and fruit. Three meals at set times with snacks allowed between meals, but they must be fruit or vegetables. Aim to eat over a 12 hour period , example 7 am to 7 pm, and STOP after  your last meal of the day. Drink water,generally about 64 ounces per day, no other drink is as healthy. Fruit juice is best enjoyed in a healthy way, by EATING the fruit.  Thanks for choosing [name] we consider it a privelige to serve you.

## 2021-11-03 NOTE — Assessment & Plan Note (Signed)
Presents for hospital discharge follow-up for chronic back pain  Hospital discharge summaries labs reviewed by me today. Patient denies any new complaints Able to ambulate with a walker.

## 2021-11-03 NOTE — Assessment & Plan Note (Signed)
Currently on Cardizem 360 mg daily, aspirin 81 mg daily Continue current medications Denies palpitation, shortness of breath

## 2021-11-03 NOTE — Assessment & Plan Note (Signed)
BP Readings from Last 3 Encounters:  11/03/21 (!) 116/59  11/01/21 125/83  10/22/21 127/79  Chronic medical condition well-controlled On Cardizem 360 mg daily. Lisinopril was discontinued while in the hospital Continue Cardizem 360 mg daily. Regular walking exercise as tolerated encouraged.

## 2021-11-03 NOTE — Assessment & Plan Note (Addendum)
Lab Results  Component Value Date   WBC 7.0 10/27/2021   HGB 12.3 (L) 10/27/2021   HCT 34.6 (L) 10/27/2021   MCV 111.6 (H) 10/27/2021   PLT 368 10/27/2021  Platelet is back to normal Patient encouraged to continue to abstain from drinking alcohol.

## 2021-11-03 NOTE — Progress Notes (Signed)
New Patient Office Visit  Subjective:  Patient ID: Billy Anderson, male    DOB: 02-20-1965  Age: 56 y.o. MRN: [id number]  CC:  Chief Complaint  Patient presents with   Establish Care   Hospitalization Follow-up    Per pt he is feeling better     HPI Billy Anderson is a 56 y.o. male with past medical history of hypertension, paroxysmal A-fib, anxiety, bipolar disorder, alcohol use , tobacco use, chronic back pain, seizure, psoriatic arthritis who presents for hospital discharge follow-up  and to establish care for his chronic medical conditions.  He was on admission at the hospital from 10/15/2021 to 10/22/2021  for thrombocytopenia, ambulatory dysfunction, chronic low back pain .Neurosurgeon was consulted for his chronic low back pain there was no plan for surgical intervention.  He was discharged to inpatient rehab from October 22, 2021 to November 01, 2021 .  Patient is now able to walk with a walker, has been taking Tylenol as needed for his pain also on gabapentin.  Has upcoming appointment with neurosurgery, states that he will resume PT in November    Had positive Hemoccult while in the hospital currently on PPI, referral to GI is pending, needs colonoscopy  Seizure disorder.  Currently on Lamictal 200 mg daily, Gabapentin 300mg  BID states that he takes sodium 1 g up to 3-4 times daily due to hyponatremia as a result of taking Lamictal.  Would like to go back on sodium tablets.  Last seizure was about a year ago.  He was previously seen neurologist at [facility].   Would refer to dermatologist, he was on Humira for psoriasis medication was discontinued while he was in the hospital.   Needs referral to psychiatry in [place] area, currently on Celexa 10 mg daily, olanzapine 20 mg daily patient denies SI, HI    He has stopped drinking alcohol since 2 weeks ago, smokes 1 pack of cigarettes daily started smoking at age 27.   Due for Tdap vaccine, second dose of shingles vaccine  need for both vaccines discussed with patient patient encouraged to get the vaccine from the pharmacy     Past Medical History:  Diagnosis Date   Seizures [facility])     Past Surgical History:  Procedure Laterality Date   HAND SURGERY Left     Family History  Problem Relation Age of Onset   Bipolar disorder Mother     Social History   Socioeconomic History   Marital status: Married    Spouse name: Not on file   Number of children: 3   Years of education: Not on file   Highest education level: Not on file  Occupational History   Not on file  Tobacco Use   Smoking status: Every Day    Packs/day: 1.00    Types: Cigarettes   Smokeless tobacco: Not on file  Vaping Use   Vaping Use: Never used  Substance and Sexual Activity   Alcohol use: Not Currently    Alcohol/week: 48.0 standard drinks of alcohol    Types: 48 Shots of liquor per week    Comment: 1 pint a day   Drug use: Never   Sexual activity: Not Currently  Other Topics Concern   Not on file  Social History Narrative   Not on file   Social Determinants of Health   Financial Resource Strain: Not on file  Food Insecurity: Not on file  Transportation Needs: Unknown (10/16/2021)   PRAPARE - Transportation  Lack of Transportation (Medical): Patient refused    Lack of Transportation (Non-Medical): Patient refused  Physical Activity: Not on file  Stress: Not on file  Social Connections: Not on file  Intimate Partner Violence: Not on file    ROS Review of Systems  Constitutional: Negative.  Negative for activity change, appetite change, chills and diaphoresis.  HENT: Negative.  Negative for congestion, dental problem, drooling and ear discharge.   Respiratory: Negative.  Negative for cough, choking, chest tightness and shortness of breath.   Cardiovascular: Negative.  Negative for chest pain, palpitations and leg swelling.  Gastrointestinal: Negative.  Negative for abdominal distention, abdominal pain and anal  bleeding.  Musculoskeletal:  Positive for back pain.  Psychiatric/Behavioral: Negative.  Negative for agitation, behavioral problems, confusion, decreased concentration and dysphoric mood.     Objective:   Today's Vitals: BP (!) 116/59   Pulse 88   Temp 98.3 F (36.8 C)   Ht 6\' 1"  (1.854 m)   Wt 177 lb 9.6 oz (80.6 kg)   SpO2 100%   BMI 23.43 kg/m   Physical Exam Constitutional:      General: He is not in acute distress.    Appearance: He is not ill-appearing, toxic-appearing or diaphoretic.  Eyes:     General: No scleral icterus.       Right eye: No discharge.        Left eye: No discharge.     Extraocular Movements: Extraocular movements intact.     Conjunctiva/sclera: Conjunctivae normal.     Pupils: Pupils are equal, round, and reactive to light.  Cardiovascular:     Rate and Rhythm: Normal rate and regular rhythm.     Heart sounds: No murmur heard.    No friction rub. No gallop.  Pulmonary:     Effort: Pulmonary effort is normal. No respiratory distress.     Breath sounds: Normal breath sounds. No stridor. No wheezing, rhonchi or rales.  Chest:     Chest wall: No tenderness.  Abdominal:     General: There is no distension.     Palpations: Abdomen is soft.     Tenderness: There is no abdominal tenderness. There is no right CVA tenderness, left CVA tenderness or guarding.  Musculoskeletal:        General: No deformity.     Right lower leg: No edema.     Left lower leg: No edema.     Comments: Using a walker for ambulation, denies tenderness on palpation of lower back, no swelling noted    Skin:    Capillary Refill: Capillary refill takes less than 2 seconds.  Neurological:     Mental Status: He is alert and oriented to person, place, and time.     Cranial Nerves: No cranial nerve deficit.     Gait: Gait abnormal.  Psychiatric:        Mood and Affect: Mood normal.        Thought Content: Thought content normal.        Judgment: Judgment normal.      Assessment & Plan:   Problem List Items Addressed This Visit       Cardiovascular and Mediastinum   Essential hypertension    BP Readings from Last 3 Encounters:  11/03/21 (!) 116/59  11/01/21 125/83  10/22/21 127/79  Chronic medical condition well-controlled On Cardizem 360 mg daily. Lisinopril was discontinued while in the hospital Continue Cardizem 360 mg daily. Regular walking exercise as tolerated encouraged.  Paroxysmal atrial fibrillation (HCC)    Currently on Cardizem 360 mg daily, aspirin 81 mg daily Continue current medications Denies palpitation, shortness of breath        Musculoskeletal and Integument   Psoriatic arthritis (HCC)    Humira was discontinued while he was in the hospital  Patient referred to dermatology       Relevant Orders   Ambulatory referral to Dermatology     Hematopoietic and Hemostatic   Thrombocytopenia ([place]) - Primary    Lab Results  Component Value Date   WBC 7.0 10/27/2021   HGB 12.3 (L) 10/27/2021   HCT 34.6 (L) 10/27/2021   MCV 111.6 (H) 10/27/2021   PLT 368 10/27/2021  Platelet is back to normal Patient encouraged to continue to abstain from drinking alcohol.        Other   Alcohol use disorder, moderate, in early remission [facility])    Patient stated that he stopped drinking alcohol about 2 weeks ago Patient encouraged to continue to abstain from drinking alcohol.  Need to avoid alcohol including liver damage, falls, confusion discussed with patient he verbalized understanding      Hyponatremia    Lab Results  Component Value Date   NA 138 10/27/2021   K 4.8 10/27/2021   CO2 28 10/27/2021   GLUCOSE 99 10/27/2021   BUN 17 10/27/2021   CREATININE 0.78 10/27/2021   CALCIUM 9.4 10/27/2021   GFRNONAA >60 10/27/2021  Has history of hyponatremia Sodium tablets 1 g 3 times daily reordered We will check sodium level in 1 week      Relevant Medications   sodium chloride 1 g tablet   Other Relevant Orders    Sodium   Bipolar I disorder, most recent episode (or current) manic ([place])    Currently on olanzapine 20 mg daily, Celexa 10 mg daily, denies SI, HI Followed by psychiatrist would like referral to a new psychiatry locally. Referral to psych placed today      Relevant Orders   Ambulatory referral to Psychiatry   Chronic midline low back pain    Chronic condition currently on gabapentin 300 mg twice daily, able to ambulate with walker  Referral to neurosurgery is pending. Patient encouraged to engage in regular daily exercise as tolerated, continue to use walker to prevent falls      Tobacco use disorder    Smokes about 1 pack/day  Asked about quitting: confirms that he/she currently smokes cigarettes Advise to quit smoking: Educated about QUITTING to reduce the risk of cancer, cardio and cerebrovascular disease. Assess willingness: Unwilling to quit at this time, but is working on cutting back. Assist with counseling and pharmacotherapy: Counseled for 5 minutes and literature provided. Arrange for follow up: follow up in 2 months and continue to offer help.      Hospital discharge follow-up    Presents for hospital discharge follow-up for chronic back pain  Hospital discharge summaries labs reviewed by me today. Patient denies any new complaints Able to ambulate with a walker.      Seizure [facility])    Patient encouraged to continue to abstain from alcohol Continue Lamictal 200 mg daily, gabapentin 300mg  BID  Sodium 1 g 3 times daily reordered Patient referred to neurology      Relevant Orders   Ambulatory referral to Neurology    Outpatient Encounter Medications as of 11/03/2021  Medication Sig   aspirin EC 81 MG tablet Take 1 tablet (81 mg total) by mouth daily.   Calcium  Carb-Cholecalciferol (CALCIUM 600+D3 PO) Take 1 tablet by mouth 2 (two) times daily.   citalopram (CELEXA) 10 MG tablet Take 1 tablet (10 mg total) by mouth at bedtime.   diltiazem (CARDIZEM CD) 180 MG  24 hr capsule Take 2 capsules (360 mg total) by mouth daily.   Ensure Max Protein (ENSURE MAX PROTEIN) LIQD Take 330 mLs (11 oz total) by mouth daily.   Fexofenadine HCl (ALLEGRA PO) Take 1 tablet by mouth daily as needed (allergies).   folic acid (FOLVITE) 1 MG tablet Take 1 tablet (1 mg total) by mouth daily.   gabapentin (NEURONTIN) 300 MG capsule Take 1 capsule (300 mg total) by mouth 2 (two) times daily.   lamoTRIgine (LAMICTAL) 100 MG tablet Take 2 tablets (200 mg total) by mouth at bedtime.   melatonin 3 MG TABS tablet Take 1 tablet (3 mg total) by mouth at bedtime as needed (for sleep).   Multiple Vitamin (MULTIVITAMIN) tablet Take 1 tablet by mouth daily.   OLANZapine (ZYPREXA) 20 MG tablet Take 20 mg by mouth at bedtime.   pantoprazole (PROTONIX) 40 MG tablet Take 1 tablet (40 mg total) by mouth daily.   polyethylene glycol powder (GLYCOLAX/MIRALAX) 17 GM/SCOOP powder Take 17 g by mouth 2 (two) times daily as needed for mild constipation.   pyridOXINE (VITAMIN B6) 100 MG tablet Take 1 tablet (100 mg total) by mouth daily.   thiamine (VITAMIN B1) 100 MG tablet Take 1 tablet (100 mg total) by mouth daily.   diclofenac Sodium (VOLTAREN) 1 % GEL Apply 2 g topically 4 (four) times daily as needed (pain). (Patient not taking: Reported on 11/03/2021)   hydrocerin (EUCERIN) CREA Apply 1 Application topically 2 (two) times daily. (Patient not taking: Reported on 11/03/2021)   nicotine (NICODERM CQ - DOSED IN MG/24 HOURS) 21 mg/24hr patch Place 1 patch (21 mg total) onto the skin daily. (Patient not taking: Reported on 11/03/2021)   senna-docusate (SENOKOT-S) 8.6-50 MG tablet Take 1 tablet by mouth 2 (two) times daily as needed for moderate constipation. (Patient not taking: Reported on 11/03/2021)   sodium chloride 1 g tablet Take 1 tablet (1 g total) by mouth 3 (three) times daily with meals.   No facility-administered encounter medications on file as of 11/03/2021.    Follow-up: Return in  about 2 months (around 01/03/2022) for HTN/AFIB.   Renee Rival, FNP

## 2021-11-03 NOTE — Assessment & Plan Note (Signed)
Smokes about 1 pack/day  Asked about quitting: confirms that he/she currently smokes cigarettes Advise to quit smoking: Educated about QUITTING to reduce the risk of cancer, cardio and cerebrovascular disease. Assess willingness: Unwilling to quit at this time, but is working on cutting back. Assist with counseling and pharmacotherapy: Counseled for 5 minutes and literature provided. Arrange for follow up: follow up in 2 months and continue to offer help.

## 2021-11-03 NOTE — Assessment & Plan Note (Signed)
Patient stated that he stopped drinking alcohol about 2 weeks ago Patient encouraged to continue to abstain from drinking alcohol.  Need to avoid alcohol including liver damage, falls, confusion discussed with patient he verbalized understanding

## 2021-11-03 NOTE — Assessment & Plan Note (Signed)
Patient encouraged to continue to abstain from alcohol Continue Lamictal 200 mg daily, gabapentin 300mg  BID  Sodium 1 g 3 times daily reordered Patient referred to neurology

## 2021-11-03 NOTE — Telephone Encounter (Signed)
SW confirmed with [facility] able to accept referral for HHPT/OT/SLP.  SW left message for pt [phone]) and pt wife [name] [phone]) informing on HHA with branch contact and encouraged follow-up if there has been no contact.   Case closed to SW.   Loralee Pacas, MSW, [place] Office: [phone] Cell: [phone] Fax: [phone]

## 2021-11-05 ENCOUNTER — Telehealth: Payer: Self-pay

## 2021-11-05 NOTE — Telephone Encounter (Signed)
Care everywhere 

## 2021-12-02 ENCOUNTER — Other Ambulatory Visit: Payer: Self-pay | Admitting: Physical Medicine and Rehabilitation

## 2021-12-02 ENCOUNTER — Telehealth: Payer: Self-pay

## 2021-12-02 MED ORDER — LAMOTRIGINE 100 MG PO TABS
200.0000 mg | ORAL_TABLET | Freq: Every evening | ORAL | 0 refills | Status: DC
  Filled 2021-12-02: qty 30, 15d supply, fill #0

## 2021-12-02 NOTE — Telephone Encounter (Signed)
Pt called wanting for a referral to see dermatology. Pt advised that he was seen in the last 30 days . Pt will be in for lab tomorrow. Please advise if you are ok to with putting this in.

## 2021-12-03 ENCOUNTER — Encounter: Payer: Medicaid Other | Admitting: Registered Nurse

## 2021-12-03 ENCOUNTER — Other Ambulatory Visit (HOSPITAL_COMMUNITY): Payer: Self-pay

## 2021-12-04 ENCOUNTER — Telehealth: Payer: Self-pay | Admitting: Physical Medicine & Rehabilitation

## 2021-12-04 ENCOUNTER — Other Ambulatory Visit: Payer: Self-pay

## 2021-12-04 NOTE — Telephone Encounter (Signed)
Therapist requesting verbal orders for physical therapy.   2x 2 weeks 1x 1 week   Please call and leave on voicemail [phone]

## 2021-12-05 ENCOUNTER — Other Ambulatory Visit: Payer: Medicaid Other

## 2021-12-05 DIAGNOSIS — E871 Hypo-osmolality and hyponatremia: Secondary | ICD-10-CM

## 2021-12-05 NOTE — Telephone Encounter (Signed)
Notified. 

## 2021-12-06 LAB — SODIUM: Sodium: 138 mmol/L (ref 134–144)

## 2021-12-08 NOTE — Progress Notes (Signed)
Normal sodium level, continue current medications

## 2021-12-15 ENCOUNTER — Telehealth: Payer: Self-pay

## 2021-12-15 NOTE — Telephone Encounter (Signed)
Patient called in request referral for dermatologist from Dr. Riley Kill, advised pt that he needs to contact his PCP for referral, patient understood.

## 2021-12-22 ENCOUNTER — Telehealth: Admit: 2021-12-22 | Discharge: 2021-12-23 | Payer: MEDICAID

## 2021-12-22 DIAGNOSIS — F1021 Alcohol dependence, in remission: Principal | ICD-10-CM

## 2021-12-22 DIAGNOSIS — F319 Bipolar disorder, unspecified: Principal | ICD-10-CM

## 2021-12-22 DIAGNOSIS — E871 Hypo-osmolality and hyponatremia: Principal | ICD-10-CM

## 2021-12-22 MED ORDER — OLANZAPINE 20 MG TABLET
ORAL_TABLET | Freq: Every evening | ORAL | 2 refills | 30.00000 days | Status: CP
Start: 2021-12-22 — End: 2022-03-22

## 2021-12-22 MED ORDER — NALTREXONE 50 MG TABLET
ORAL_TABLET | Freq: Every day | ORAL | 2 refills | 30 days | Status: CP
Start: 2021-12-22 — End: 2022-03-22

## 2021-12-22 MED ORDER — LAMOTRIGINE 100 MG TABLET
ORAL_TABLET | Freq: Every day | ORAL | 1 refills | 30 days | Status: CP
Start: 2021-12-22 — End: 2021-12-22

## 2021-12-23 ENCOUNTER — Other Ambulatory Visit: Payer: Self-pay | Admitting: Nurse Practitioner

## 2021-12-23 MED ORDER — CITALOPRAM HYDROBROMIDE 10 MG PO TABS: 10.0000 mg | ORAL_TABLET | Freq: Every day | ORAL | 1 refills | Status: DC

## 2021-12-23 NOTE — Telephone Encounter (Signed)
Caller & Relationship to patient:  MRN #  [id number]   Call Back Number:   Date of Last Office Visit: Visit date not found     Date of Next Office Visit: Visit date not found    Medication(s) to be Refilled: Citalopram   Preferred Pharmacy:   ** Please notify patient to allow 48-72 hours to process** **Let patient know to contact pharmacy at the end of the day to make sure medication is ready. ** **If patient has not been seen in a year or longer, book an appointment **Advise to use MyChart for refill requests OR to contact their pharmacy

## 2021-12-24 ENCOUNTER — Ambulatory Visit (HOSPITAL_BASED_OUTPATIENT_CLINIC_OR_DEPARTMENT_OTHER): Payer: Medicare Other | Admitting: Psychiatry

## 2021-12-24 ENCOUNTER — Encounter (HOSPITAL_COMMUNITY): Payer: Self-pay | Admitting: Psychiatry

## 2021-12-24 VITALS — Wt 185.0 lb

## 2021-12-24 DIAGNOSIS — F1021 Alcohol dependence, in remission: Secondary | ICD-10-CM

## 2021-12-24 DIAGNOSIS — F319 Bipolar disorder, unspecified: Secondary | ICD-10-CM

## 2021-12-24 MED ORDER — LAMOTRIGINE 100 MG PO TABS: 100.0000 mg | ORAL_TABLET | Freq: Two times a day (BID) | ORAL | 1 refills | Status: DC

## 2021-12-24 MED ORDER — OLANZAPINE 20 MG PO TABS: 20.0000 mg | ORAL_TABLET | Freq: Every day | ORAL | 1 refills | Status: DC

## 2021-12-24 MED ORDER — NALTREXONE HCL 50 MG PO TABS: 50.0000 mg | ORAL_TABLET | Freq: Every day | ORAL | 1 refills | Status: DC

## 2021-12-24 NOTE — Progress Notes (Signed)
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 Differential/Platelet     Status: Abnormal   Collection Time: 10/23/21  5:32 AM  Result Value Ref Range   WBC 3.7 (L) 4.0 - 10.5 K/uL   RBC 3.34 (L) 4.22 - 5.81 MIL/uL   Hemoglobin 12.6 (L) 13.0 - 17.0 g/dL   HCT 70.6 (L) 23.7 - 62.8 %   MCV 106.3 (H) 80.0 - 100.0 fL   MCH 37.7 (H) 26.0 - 34.0 pg   MCHC 35.5 30.0 - 36.0 g/dL   RDW 31.5 17.6 - 16.0 %   Platelets 234 150 - 400 K/uL   nRBC 0.0 0.0 - 0.2 %   Neutrophils Relative % 48 %   Neutro Abs 1.8 1.7 - 7.7 K/uL   Lymphocytes Relative 25 %   Lymphs Abs 0.9 0.7 - 4.0 K/uL   Monocytes Relative 21 %   Monocytes Absolute 0.8 0.1 - 1.0 K/uL   Eosinophils Relative 3 %  Eosinophils Absolute 0.1 0.0 - 0.5 K/uL   Basophils Relative 1 %   Basophils Absolute 0.0 0.0 - 0.1 K/uL   Immature Granulocytes 2 %   Abs Immature Granulocytes 0.07 0.00 - 0.07 K/uL    Comment: Performed at [facility], [address], [place], [place] [address]  Magnesium     Status: None   Collection Time: 10/23/21  5:32 AM  Result Value Ref Range   Magnesium 1.8 1.7 - 2.4 mg/dL    Comment: Performed at [facility], [address] [place], [place] [address]  CBC     Status: Abnormal   Collection Time: 10/27/21  5:57 AM  Result Value Ref Range   WBC 7.0 4.0 - 10.5 K/uL   RBC 3.10 (L) 4.22 - 5.81 MIL/uL   Hemoglobin 12.3 (L) 13.0 - 17.0 g/dL   HCT 09.8 (L) 11.9 - 14.7 %   MCV 111.6 (H) 80.0 - 100.0 fL   MCH 39.7 (H) 26.0 - 34.0 pg   MCHC 35.5 30.0 - 36.0 g/dL   RDW 82.9 56.2 - 13.0 %   Platelets 368 150 - 400 K/uL   nRBC 0.0 0.0 - 0.2 %    Comment: Performed at [facility], [address], [place], [place] [address]  Basic metabolic panel     Status: None   Collection Time: 10/27/21  5:57 AM  Result Value Ref Range   Sodium 138 135 - 145 mmol/L   Potassium 4.8 3.5 - 5.1 mmol/L   Chloride 101 98 - 111 mmol/L   CO2 28  22 - 32 mmol/L   Glucose, Bld 99 70 - 99 mg/dL    Comment: Glucose reference range applies only to samples taken after fasting for at least 8 hours.   BUN 17 6 - 20 mg/dL   Creatinine, Ser 4.69 0.61 - 1.24 mg/dL   Calcium 9.4 8.9 - 62.9 mg/dL   GFR, Estimated >52 >84 mL/min    Comment: (NOTE) Calculated using the CKD-EPI Creatinine Equation (2021)    Anion gap 9 5 - 15    Comment: Performed at [facility], [address] [place], [place] [address]  Magnesium     Status: None   Collection Time: 10/27/21  5:57 AM  Result Value Ref Range   Magnesium 2.0 1.7 - 2.4 mg/dL    Comment: Performed at [facility], [address], [place], [place] [address]  Sodium     Status: None   Collection Time: 12/05/21  2:18 PM  Result Value Ref Range   Sodium 138 134 - 144 mmol/L      Constitutional:  Wt 185 lb (83.9 kg)   BMI 24.41 kg/m    Musculoskeletal: Strength & Muscle Tone: within normal limits Gait & Station: unsteady, uses walker Patient leans: N/A  Psychiatric Specialty Exam: Physical Exam  ROS  Weight 185 lb (83.9 kg).There is no height or weight on file to calculate BMI.  General Appearance: Casual  Eye Contact:  Good  Speech:  Slow  Volume:  Normal  Mood:  Euthymic  Affect:  Congruent  Thought Process:  Goal Directed  Orientation:  Full (Time, Place, and Person)  Thought Content:  WDL  Suicidal Thoughts:  No  Homicidal Thoughts:  No  Memory:  Immediate;   Good Recent;   Good Remote;   Fair  Judgement:  Fair  Insight:  Present  Psychomotor Activity:  Normal  Concentration:  Concentration:  Good and Attention Span: Good  Recall:  [employer] of Knowledge:  Fair  Language:  Good  Akathisia:  No  Handed:  Right  AIMS (if indicated):     Assets:  Communication Skills Desire for Improvement Housing Resilience Social Support  ADL's:  Intact  Cognition:  WNL  Sleep:   7 hrs     Assessment/Plan:  Patient is 56 year old Caucasian, married man  with significant history of drinking, hyponatremia, bipolar disorder, seizure disorder and back pain.  Patient is stable on current dose of olanzapine 20 mg daily and Lamictal 100 mg twice a day.  He had no seizures since 12 months.  He was taking naltrexone to help his alcohol craving but he is ran out and like to go back on it.  Patient is not interested in therapy.  So far he is tolerating all his medication and reported no side effects.  Recently his PCP started on Celexa but he does not want to take because he does not want his mania to come back.  I will continue olanzapine 20 mg daily, lamotrigine 100 mg twice a day and restart naltrexone 50 mg daily.  Patient has appointment coming up with the neurology and I recommend to keep that appointment.  Discuss safety concern that anytime having active suicidal thoughts or homicidal thoughts then he need to call 911 or go to local emergency room.  Discussed medication side effects.  Follow-up in 6 weeks.     Cleotis Nipper, MD 12/24/2021    Follow Up Instructions: I discussed the assessment and treatment plan with the patient. The patient was provided an opportunity to ask questions and all were answered. The patient agreed with the plan and demonstrated an understanding of the instructions.   The patient was advised to call back or seek an in-person evaluation if the symptoms worsen or if the condition fails to improve as anticipated.   Collaboration of Care: Primary Care Provider AEB notes are available in epic to review.   Patient/Guardian was advised Release of Information must be obtained prior to any record release in order to collaborate their care with an outside provider. Patient/Guardian was advised if they have not already done so to contact the registration department to sign all necessary forms in order for [place] to release information regarding their care.    Consent: Patient/Guardian gives verbal consent for treatment and assignment of  benefits for services provided during this visit. Patient/Guardian expressed understanding and agreed to proceed.     I provided 62 minutes of non-face-to-face time during this encounter.

## 2022-01-02 ENCOUNTER — Ambulatory Visit: Payer: Self-pay | Admitting: Nurse Practitioner

## 2022-01-05 ENCOUNTER — Encounter: Payer: Self-pay | Admitting: Neurology

## 2022-01-05 ENCOUNTER — Telehealth: Payer: Self-pay | Admitting: Neurology

## 2022-01-05 ENCOUNTER — Ambulatory Visit (INDEPENDENT_AMBULATORY_CARE_PROVIDER_SITE_OTHER): Payer: Medicare Other | Admitting: Neurology

## 2022-01-05 VITALS — BP 116/74 | HR 64 | Ht 72.0 in | Wt 182.0 lb

## 2022-01-05 DIAGNOSIS — F1021 Alcohol dependence, in remission: Secondary | ICD-10-CM | POA: Diagnosis not present

## 2022-01-05 DIAGNOSIS — M5412 Radiculopathy, cervical region: Secondary | ICD-10-CM

## 2022-01-05 DIAGNOSIS — G40009 Localization-related (focal) (partial) idiopathic epilepsy and epileptic syndromes with seizures of localized onset, not intractable, without status epilepticus: Secondary | ICD-10-CM | POA: Diagnosis not present

## 2022-01-05 DIAGNOSIS — F319 Bipolar disorder, unspecified: Secondary | ICD-10-CM | POA: Diagnosis not present

## 2022-01-05 DIAGNOSIS — Z5181 Encounter for therapeutic drug level monitoring: Secondary | ICD-10-CM | POA: Diagnosis not present

## 2022-01-05 MED ORDER — GABAPENTIN 300 MG PO CAPS: 300.0000 mg | ORAL_CAPSULE | Freq: Two times a day (BID) | ORAL | 11 refills | Status: DC

## 2022-01-05 MED ORDER — TIZANIDINE HCL 4 MG PO TABS: 4.0000 mg | ORAL_TABLET | Freq: Three times a day (TID) | ORAL | 0 refills | Status: DC | PRN

## 2022-01-05 MED ORDER — LAMOTRIGINE 100 MG PO TABS: 200.0000 mg | ORAL_TABLET | Freq: Every evening | ORAL | 11 refills | Status: DC

## 2022-01-05 NOTE — Telephone Encounter (Signed)
medicare/[place] NPR sent to GI [phone] 

## 2022-01-05 NOTE — Patient Instructions (Signed)
Continue with lamotrigine 200 mg at night Continue with gabapentin 300 mg twice daily Will check a lamotrigine level with BMP Trial of tizanidine for back pain Follow-up in 6 months or sooner if worse

## 2022-01-05 NOTE — Progress Notes (Signed)
[facility]  PATIENT: Billy ListenMarvin Kirschbaum III DOB: 31-Aug-1965  REQUESTING CLINICIAN: Donell BeersPaseda, Folashade R, FNP HISTORY FROM: Patient and spouse  REASON FOR VISIT: Establish care for his epilepsy    HISTORICAL  CHIEF COMPLAINT:  Chief Complaint  Patient presents with   New Patient (Initial Visit)    Rm 13. Accompanied by wife. NP/Internal referral for seizures.    HISTORY OF PRESENT ILLNESS:  This is a 56 year old gentleman past medical history of seizure disorder, history of carpal tunnel syndrome left hand, chronic back pain, alcoholism in remission, hyponatremia improved who is presenting to establish care for his seizures.  Per wife seizure started first in December 2020, he had a second seizure in January 2021.  Seizure has been described as grand mal seizure.  Wife reported seizure always start with patient having a head deviation to the left followed by generalized convulsion.  Since then he had a total about 10 seizures and his last seizure was on October 2022.  He is compliant on the Lamotrigine 200 mg at night and also gabapentin 300 mg twice daily.  Denies any side effects from the medication.  His seizure risk factors include history of head trauma and history of alcohol abuse.      Handedness: Right hand  Onset: December 2020  Seizure Type: Head deviation to the left followed by generalized convulsion  Current frequency: Last seizure October 2022  Any injuries from seizures: Fall, head injury  Seizure risk factors: Alcohol abuse, history of falls.  Previous ASMs: Lamotrigine, Depakote, Gabapentin  Currenty ASMs: Lamotrigine 200 mg twice daily, Gabapentin 300 mg BID   ASMs side effects: Denies  Brain Images: MRI brain July 2022 normal  Previous EEGs: EEG July 2022: Diffuse slowing    OTHER MEDICAL CONDITIONS: Seizure disorder, L carpal tunnel syndrome, chronic back pain, alcohol abuse in remission, hyponatremia improved   REVIEW OF SYSTEMS:  Full 14 system review of systems performed and negative with exception of: As noted in the HPI   ALLERGIES: Allergies  Allergen Reactions   Ativan [Lorazepam] Other (See Comments)    Confusion  Hallucinations    Roxicodone [Oxycodone] Itching   Ultram [Tramadol] Other (See Comments)    Seizures    Vistaril [Hydroxyzine] Itching and Other (See Comments)    Sedation Not effective in reducing anxiety    HOME MEDICATIONS: Outpatient Medications Prior to Visit  Medication Sig Dispense Refill   aspirin EC 81 MG tablet Take 1 tablet (81 mg total) by mouth daily. 30 tablet 0   Calcium Carb-Cholecalciferol (CALCIUM 600+D3 PO) Take 1 tablet by mouth 2 (two) times daily.     citalopram (CELEXA) 10 MG tablet Take 1 tablet (10 mg total) by mouth at bedtime. 90 tablet 1   diclofenac Sodium (VOLTAREN) 1 % GEL Apply 2 g topically 4 (four) times daily as needed (pain).     diltiazem (CARDIZEM CD) 180 MG 24 hr capsule Take 2 capsules (360 mg total) by mouth daily. 60 capsule 0   Fexofenadine HCl (ALLEGRA PO) Take 1 tablet by mouth daily.     folic acid (FOLVITE) 1 MG tablet Take 1 tablet (1 mg total) by mouth daily. 30 tablet 0   melatonin 3 MG TABS tablet Take 1 tablet (3 mg total) by mouth at bedtime as needed (for sleep). 30 tablet 0   Multiple Vitamin (MULTIVITAMIN) tablet Take 1 tablet by mouth daily.     naltrexone (DEPADE) 50 MG tablet Take 1 tablet (50 mg total) by  mouth daily. 30 tablet 1   OLANZapine (ZYPREXA) 20 MG tablet Take 1 tablet (20 mg total) by mouth at bedtime. 30 tablet 1   pantoprazole (PROTONIX) 40 MG tablet Take 1 tablet (40 mg total) by mouth daily. 30 tablet 0   polyethylene glycol powder (GLYCOLAX/MIRALAX) 17 GM/SCOOP powder Take 17 g by mouth 2 (two) times daily as needed for mild constipation. 238 g 0   pyridOXINE (VITAMIN B6) 100 MG tablet Take 1 tablet (100 mg total) by mouth daily. 30 tablet 0   sodium chloride 1 g tablet Take 1 tablet (1 g total) by mouth 3 (three)  times daily with meals. 120 tablet 0   thiamine (VITAMIN B1) 100 MG tablet Take 1 tablet (100 mg total) by mouth daily. 30 tablet 0   gabapentin (NEURONTIN) 300 MG capsule Take 1 capsule (300 mg total) by mouth 2 (two) times daily. 60 capsule 0   lamoTRIgine (LAMICTAL) 100 MG tablet Take 1 tablet (100 mg total) by mouth 2 (two) times daily. (Patient taking differently: Take 200 mg by mouth daily.) 60 tablet 1   Ensure Max Protein (ENSURE MAX PROTEIN) LIQD Take 330 mLs (11 oz total) by mouth daily.     hydrocerin (EUCERIN) CREA Apply 1 Application topically 2 (two) times daily. (Patient not taking: Reported on 11/03/2021) 454 g 0   nicotine (NICODERM CQ - DOSED IN MG/24 HOURS) 21 mg/24hr patch Place 1 patch (21 mg total) onto the skin daily. (Patient not taking: Reported on 11/03/2021) 28 patch 0   senna-docusate (SENOKOT-S) 8.6-50 MG tablet Take 1 tablet by mouth 2 (two) times daily as needed for moderate constipation. (Patient not taking: Reported on 11/03/2021) 60 tablet 0   No facility-administered medications prior to visit.    PAST MEDICAL HISTORY: Past Medical History:  Diagnosis Date   Seizures (HCC)     PAST SURGICAL HISTORY: Past Surgical History:  Procedure Laterality Date   HAND SURGERY Left     FAMILY HISTORY: Family History  Problem Relation Age of Onset   Bipolar disorder Mother     SOCIAL HISTORY: Social History   Socioeconomic History   Marital status: Married    Spouse name: Not on file   Number of children: 3   Years of education: Not on file   Highest education level: Not on file  Occupational History   Not on file  Tobacco Use   Smoking status: Every Day    Packs/day: 1.00    Types: Cigarettes   Smokeless tobacco: Not on file  Vaping Use   Vaping Use: Never used  Substance and Sexual Activity   Alcohol use: Not Currently    Alcohol/week: 48.0 standard drinks of alcohol    Types: 48 Shots of liquor per week    Comment: 1 pint a day   Drug use:  Never   Sexual activity: Not Currently  Other Topics Concern   Not on file  Social History Narrative   Not on file   Social Determinants of Health   Financial Resource Strain: Not on file  Food Insecurity: Not on file  Transportation Needs: Unknown (10/16/2021)   PRAPARE - Transportation    Lack of Transportation (Medical): Patient refused    Lack of Transportation (Non-Medical): Patient refused  Physical Activity: Not on file  Stress: Not on file  Social Connections: Not on file  Intimate Partner Violence: Not on file    PHYSICAL EXAM  GENERAL EXAM/CONSTITUTIONAL: Vitals:  Vitals:   01/05/22 1536  BP: 116/74  Pulse: 64  Weight: 182 lb (82.6 kg)  Height: 6' (1.829 m)   Body mass index is 24.68 kg/m. Wt Readings from Last 3 Encounters:  01/05/22 182 lb (82.6 kg)  11/03/21 177 lb 9.6 oz (80.6 kg)  10/29/21 183 lb 3.2 oz (83.1 kg)   Patient is in no distress; well developed, nourished and groomed; neck is supple  EYES: Visual fields full to confrontation, Extraocular movements intacts,  No results found.  MUSCULOSKELETAL: Gait, strength, tone, movements noted in Neurologic exam below  NEUROLOGIC: MENTAL STATUS:      No data to display         awake, alert, oriented to person, place and time recent and remote memory intact normal attention and concentration language fluent, comprehension intact, naming intact fund of knowledge appropriate  CRANIAL NERVE:  2nd, 3rd, 4th, 6th - Visual fields full to confrontation, extraocular muscles intact, no nystagmus 5th - facial sensation symmetric 7th - facial strength symmetric 8th - hearing intact 9th - palate elevates symmetrically, uvula midline 11th - shoulder shrug symmetric 12th - tongue protrusion midline  MOTOR:  He has atrophy of both thenar and hypothenar aspects of the hand, left worse than right.   SENSORY:  normal and symmetric to light touch  COORDINATION:  finger-nose-finger, fine finger  movements normal  REFLEXES:  deep tendon reflexes present and symmetric  GAIT/STATION:  normal   DIAGNOSTIC DATA (LABS, IMAGING, TESTING) - I reviewed patient records, labs, notes, testing and imaging myself where available.  Lab Results  Component Value Date   WBC 7.0 10/27/2021   HGB 12.3 (L) 10/27/2021   HCT 34.6 (L) 10/27/2021   MCV 111.6 (H) 10/27/2021   PLT 368 10/27/2021      Component Value Date/Time   NA 138 12/05/2021 1418   K 4.8 10/27/2021 0557   CL 101 10/27/2021 0557   CO2 28 10/27/2021 0557   GLUCOSE 99 10/27/2021 0557   BUN 17 10/27/2021 0557   CREATININE 0.78 10/27/2021 0557   CALCIUM 9.4 10/27/2021 0557   PROT 6.6 10/23/2021 0532   ALBUMIN 3.1 (L) 10/23/2021 0532   AST 27 10/23/2021 0532   ALT 29 10/23/2021 0532   ALKPHOS 85 10/23/2021 0532   BILITOT 0.7 10/23/2021 0532   GFRNONAA >60 10/27/2021 0557   No results found for: "CHOL", "HDL", "LDLCALC", "LDLDIRECT", "TRIG" No results found for: "HGBA1C" Lab Results  Component Value Date   VITAMINB12 2,320 (H) 10/17/2021   Lab Results  Component Value Date   TSH 0.860 10/16/2021    MRI brain 08/16/2020 No acute intracranial abnormality. Chronic microvascular and atrophic changes as noted above.   EEG 08/16/2020 This EEG is borderline abnormal for some mild background slowing.     ASSESSMENT AND PLAN  56 y.o. year old male  with history of seizure disorder, chronic back pain, alcoholism in remission who is presenting to establish care for his seizure.  In terms of the seizure, his last seizure was reported October 2022.  He is compliant with his lamotrigine 200 mg at bedtime.  I will check the level and will likely continue patient on lamotrigine 200 mg at bedtime.  He is also on gabapentin 300 mg twice daily. In terms of his chronic back pain, he is on gabapentin, I will give him a trial of a muscle relaxant, tizanidine to take as needed every 8 hours. On exam today he is noted to have bilateral  thenar and hypothenar aspects of both hands atrophy  left worse than right.  He reported history of left carpal tunnel syndrome status post Carpal tunnel release surgery.  He does also have bilateral upper extremities weakness in the shoulder abduction about 4 out of 5 right worse than left.  Due to the generalized weakness of the upper extremities, atrophy of the hands, I will obtain a cervical spine MRI to rule out cervical myelopathy.  If the cervical spine come back unrevealing, we will obtain a nerve conduction study of bilateral hands.  This was explained to the patient and wife and they are comfortable with plan.  I will see them in 6 months for follow-up or sooner if worse.   1. Partial idiopathic epilepsy with seizures of localized onset, not intractable, without status epilepticus (HCC)   2. Bipolar I disorder (HCC)   3. Alcohol use disorder, moderate, in early remission (HCC)   4. Therapeutic drug monitoring   5. Cervical radiculopathy     Patient Instructions  Continue with lamotrigine 200 mg at night Continue with gabapentin 300 mg twice daily Will check a lamotrigine level with BMP Trial of tizanidine for back pain Follow-up in 6 months or sooner if worse    Per [employer] statutes, patients with seizures are not allowed to drive until they have been seizure-free for six months.  Other recommendations include using caution when using heavy equipment or power tools. Avoid working on ladders or at heights. Take showers instead of baths.  Do not swim alone.  Ensure the water temperature is not too high on the home water heater. Do not go swimming alone. Do not lock yourself in a room alone (i.e. bathroom). When caring for infants or small children, sit down when holding, feeding, or changing them to minimize risk of injury to the child in the event you have a seizure. Maintain good sleep hygiene. Avoid alcohol.  Also recommend adequate sleep, hydration, good diet and minimize  stress.   During the Seizure  - First, ensure adequate ventilation and place patients on the floor on their left side  Loosen clothing around the neck and ensure the airway is patent. If the patient is clenching the teeth, do not force the mouth open with any object as this can cause severe damage - Remove all items from the surrounding that can be hazardous. The patient may be oblivious to what's happening and may not even know what he or she is doing. If the patient is confused and wandering, either gently guide him/her away and block access to outside areas - Reassure the individual and be comforting - Call 911. In most cases, the seizure ends before EMS arrives. However, there are cases when seizures may last over 3 to 5 minutes. Or the individual may have developed breathing difficulties or severe injuries. If a pregnant patient or a person with diabetes develops a seizure, it is prudent to call an ambulance. - Finally, if the patient does not regain full consciousness, then call EMS. Most patients will remain confused for about 45 to 90 minutes after a seizure, so you must use judgment in calling for help. - Avoid restraints but make sure the patient is in a bed with padded side rails - Place the individual in a lateral position with the neck slightly flexed; this will help the saliva drain from the mouth and prevent the tongue from falling backward - Remove all nearby furniture and other hazards from the area - Provide verbal assurance as the individual is regaining consciousness -  Provide the patient with privacy if possible - Call for help and start treatment as ordered by the caregiver   After the Seizure (Postictal Stage)  After a seizure, most patients experience confusion, fatigue, muscle pain and/or a headache. Thus, one should permit the individual to sleep. For the next few days, reassurance is essential. Being calm and helping reorient the person is also of importance.  Most  seizures are painless and end spontaneously. Seizures are not harmful to others but can lead to complications such as stress on the lungs, brain and the heart. Individuals with prior lung problems may develop labored breathing and respiratory distress.     Orders Placed This Encounter  Procedures   MR CERVICAL SPINE WO CONTRAST   Lamotrigine level   Basic Metabolic Panel    Meds ordered this encounter  Medications   gabapentin (NEURONTIN) 300 MG capsule    Sig: Take 1 capsule (300 mg total) by mouth 2 (two) times daily.    Dispense:  60 capsule    Refill:  11   lamoTRIgine (LAMICTAL) 100 MG tablet    Sig: Take 2 tablets (200 mg total) by mouth at bedtime.    Dispense:  60 tablet    Refill:  11   tiZANidine (ZANAFLEX) 4 MG tablet    Sig: Take 1 tablet (4 mg total) by mouth every 8 (eight) hours as needed for muscle spasms.    Dispense:  30 tablet    Refill:  0    Return in about 6 months (around 07/07/2022).    Windell Norfolk, MD 01/05/2022, 4:34 PM  [facility] [address] [place], [place] [address] [phone]

## 2022-01-07 LAB — BASIC METABOLIC PANEL
BUN/Creatinine Ratio: 8 — ABNORMAL LOW (ref 9–20)
BUN: 7 mg/dL (ref 6–24)
CO2: 23 mmol/L (ref 20–29)
Calcium: 9.5 mg/dL (ref 8.7–10.2)
Chloride: 98 mmol/L (ref 96–106)
Creatinine, Ser: 0.85 mg/dL (ref 0.76–1.27)
Glucose: 89 mg/dL (ref 70–99)
Potassium: 4.9 mmol/L (ref 3.5–5.2)
Sodium: 138 mmol/L (ref 134–144)
eGFR: 101 mL/min/{1.73_m2} (ref >59)

## 2022-01-07 LAB — LAMOTRIGINE LEVEL: Lamotrigine Lvl: 2 ug/mL (ref 2.0–20.0)

## 2022-01-13 ENCOUNTER — Telehealth: Payer: Self-pay

## 2022-01-13 NOTE — Telephone Encounter (Signed)
Patient asking for an rx for humira be called in for him. Please advise.

## 2022-01-14 ENCOUNTER — Other Ambulatory Visit: Payer: Self-pay | Admitting: Nurse Practitioner

## 2022-02-04 ENCOUNTER — Encounter (HOSPITAL_COMMUNITY): Payer: Self-pay | Admitting: Psychiatry

## 2022-02-04 ENCOUNTER — Other Ambulatory Visit (HOSPITAL_COMMUNITY): Payer: Self-pay

## 2022-02-04 ENCOUNTER — Other Ambulatory Visit: Payer: Self-pay | Admitting: Neurology

## 2022-02-04 ENCOUNTER — Telehealth (HOSPITAL_BASED_OUTPATIENT_CLINIC_OR_DEPARTMENT_OTHER): Payer: Medicare HMO | Admitting: Psychiatry

## 2022-02-04 DIAGNOSIS — F319 Bipolar disorder, unspecified: Secondary | ICD-10-CM | POA: Diagnosis not present

## 2022-02-04 DIAGNOSIS — F1021 Alcohol dependence, in remission: Secondary | ICD-10-CM

## 2022-02-04 DIAGNOSIS — L409 Psoriasis, unspecified: Principal | ICD-10-CM

## 2022-02-04 MED ORDER — HUMIRA SYRINGE CITRATE FREE 40 MG/0.4 ML
0 refills | 0.00000 days
Start: 2022-02-04 — End: ?

## 2022-02-04 MED ORDER — OLANZAPINE 20 MG PO TABS: 20.0000 mg | ORAL_TABLET | Freq: Every day | ORAL | 2 refills | Status: DC

## 2022-02-04 MED ORDER — NALTREXONE HCL 50 MG PO TABS: 50.0000 mg | ORAL_TABLET | Freq: Every day | ORAL | 2 refills | Status: DC

## 2022-02-04 NOTE — Progress Notes (Signed)
Virtual Visit via Video Note  I connected with Billy Anderson on 02/04/22 at  3:20 PM EST by a video enabled telemedicine application and verified that I am speaking with the correct person using two identifiers.  Location: Patient: Home Provider: Home Office   I discussed the limitations of evaluation and management by telemedicine and the availability of in person appointments. The patient expressed understanding and agreed to proceed.  History of Present Illness: Patient is evaluated by video session.  He is a 57 year old Caucasian, married man who is referred from PCP for the management of his psychiatric symptoms.  Patient was seen first time 4 weeks ago.  He had a history of mania, alcohol addiction, depression complicated with seizures and hyponatremia.  He has been doing very well on his medication.  He denies any recent mania, psychosis or any hallucination.  He remained sober from drinking.  His last sodium level was okay.  He recently had a visit with the neurology and his Lamictal was continued.  He had a blood work which is stable.  Denies any recent excessive or impulsive buying, agitation.  He is pleased that his neurologist cleared him so he can start driving.  His license was restricted due to seizures.  He denies any suicidal thoughts.  He is taking naltrexone which is helping his alcohol craving.  Patient moved from [place] to live close to his wife's mother who is sick.  He lives with his wife and his 2 kids.  Patient reported no tremors, shakes or any EPS.  He denies any side effects or concerns from the medication.  He is not taking Celexa which was prescribed by PCP but he felt he does not need it.    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  Started drinking at age 12. Multiple rehab for alcohol addiction. H/O low sodium and seizure. Tried lithium, Seroquel, Vraylar with poor outcome.  No history of suicidal attempt.   History of mania with excessive spending and grandiosity.  No history of abuse.   Recent Results (from the past 2160 hour(s))  Sodium     Status: None   Collection Time: 12/05/21  2:18 PM  Result Value Ref Range   Sodium 138 134 - 144 mmol/L  Lamotrigine level     Status: None   Collection Time: 01/05/22  4:21 PM  Result Value Ref Range   Lamotrigine Lvl 2.0 2.0 - 20.0 ug/mL    Comment:                                 Detection Limit = 1.0  Basic Metabolic Panel     Status: Abnormal   Collection Time: 01/05/22  4:21 PM  Result Value Ref Range   Glucose 89 70 - 99 mg/dL   BUN 7 6 - 24 mg/dL   Creatinine, Ser 0.85 0.76 - 1.27 mg/dL   eGFR 101 >59 mL/min/1.73   BUN/Creatinine Ratio 8 (L) 9 - 20   Sodium 138 134 - 144 mmol/L   Potassium 4.9 3.5 - 5.2 mmol/L   Chloride 98 96 - 106 mmol/L   CO2 23 20 - 29 mmol/L   Calcium 9.5 8.7 - 10.2 mg/dL     Psychiatric Specialty Exam: Physical Exam  Review of Systems  Weight 182 lb (82.6 kg).There is no height or weight on file to calculate BMI.  General Appearance: Casual  Eye Contact:  Good  Speech:  Slow  Volume:  Normal  Mood:  Euthymic  Affect:  Congruent  Thought Process:  Goal Directed  Orientation:  Full (Time, Place, and Person)  Thought Content:  Logical  Suicidal Thoughts:  No  Homicidal Thoughts:  No  Memory:  Immediate;   Good Recent;   Good Remote;   Fair  Judgement:  Intact  Insight:  Present  Psychomotor Activity:  Normal  Concentration:  Concentration: Fair and Attention Span: Fair  Recall:  [employer] of Knowledge:  Good  Language:  Good  Akathisia:  No  Handed:  Right  AIMS (if indicated):     Assets:  Communication Skills Desire for Improvement Housing Social Support  ADL's:  Intact  Cognition:  WNL  Sleep:   7 hrs      Assessment and Plan: Bipolar disorder type I.  Alcohol abuse in partial remission.  Patient is stable on the current medication.  He remains sober from drinking.  Continue  olanzapine 20 mg daily, naltrexone 50 mg daily.  He is also getting Lamictal 200 mg a day prescribed by PCP.  I reviewed Lamictal level and labs which was done by neurology.  Recommended to call us back if is any question or any concern.  He is not interested in therapy.  We will follow-up in 3 months.  Follow Up Instructions:    I discussed the assessment and treatment plan with the patient. The patient was provided an opportunity to ask questions and all were answered. The patient agreed with the plan and demonstrated an understanding of the instructions.   The patient was advised to call back or seek an in-person evaluation if the symptoms worsen or if the condition fails to improve as anticipated.  Collaboration of Care: Other provider involved in patient's care AEB notes are available in epic to review.  Patient/Guardian was advised Release of Information must be obtained prior to any record release in order to collaborate their care with an outside provider. Patient/Guardian was advised if they have not already done so to contact the registration department to sign all necessary forms in order for [place] to release information regarding their care.   Consent: Patient/Guardian gives verbal consent for treatment and assignment of benefits for services provided during this visit. Patient/Guardian expressed understanding and agreed to proceed.    I provided 21 minutes of non-face-to-face time during this encounter.   Kathlee Nations, MD

## 2022-02-05 MED ORDER — HUMIRA SYRINGE CITRATE FREE 40 MG/0.4 ML
0 refills | 0.00000 days
Start: 2022-02-05 — End: ?

## 2022-02-06 ENCOUNTER — Other Ambulatory Visit (HOSPITAL_COMMUNITY): Payer: Self-pay

## 2022-02-13 ENCOUNTER — Encounter: Payer: Self-pay | Admitting: Neurology

## 2022-02-15 ENCOUNTER — Ambulatory Visit
Admission: RE | Admit: 2022-02-15 | Discharge: 2022-02-15 | Disposition: A | Discharge status: Home / Self Care | Payer: Medicare HMO | Source: Ambulatory Visit | Attending: Neurology | Admitting: Neurology

## 2022-02-15 DIAGNOSIS — M5412 Radiculopathy, cervical region: Secondary | ICD-10-CM | POA: Diagnosis not present

## 2022-02-17 ENCOUNTER — Other Ambulatory Visit: Payer: Self-pay | Admitting: Neurology

## 2022-02-17 DIAGNOSIS — G959 Disease of spinal cord, unspecified: Secondary | ICD-10-CM

## 2022-02-17 DIAGNOSIS — M5412 Radiculopathy, cervical region: Secondary | ICD-10-CM

## 2022-02-18 ENCOUNTER — Telehealth: Payer: Self-pay | Admitting: Neurology

## 2022-02-18 NOTE — Telephone Encounter (Signed)
Referral for neurosurgery fax to [facility]. Phone: [phone], Fax: [phone] 

## 2022-02-23 NOTE — Telephone Encounter (Signed)
Scheduled 2/13 at 3:30pm with Dr. Christella Noa

## 2022-02-24 ENCOUNTER — Telehealth: Payer: Self-pay | Admitting: Nurse Practitioner

## 2022-02-24 NOTE — Telephone Encounter (Signed)
Caller & Relationship to patient:  MRN #  [id number]   Call Back Number:   Date of Last Office Visit: 01/13/2022     Date of Next Office Visit: Visit date not found    Medication(s) to be Refilled: [facility] to be [facility] [phone]  Preferred Pharmacy:   ** Please notify patient to allow 48-72 hours to process** **Let patient know to contact pharmacy at the end of the day to make sure medication is ready. ** **If patient has not been seen in a year or longer, book an appointment **Advise to use MyChart for refill requests OR to contact their pharmacy

## 2022-02-26 DIAGNOSIS — I480 Paroxysmal atrial fibrillation: Principal | ICD-10-CM

## 2022-02-26 MED ORDER — OMEPRAZOLE 20 MG CAPSULE,DELAYED RELEASE
ORAL_CAPSULE | Freq: Every day | ORAL | 3 refills | 90 days
Start: 2022-02-26 — End: ?

## 2022-02-26 MED ORDER — DILTIAZEM ER 360 MG CAPSULE,24 HR,EXTENDED RELEASE
ORAL_CAPSULE | ORAL | 1 refills | 90 days
Start: 2022-02-26 — End: ?

## 2022-02-27 DIAGNOSIS — I480 Paroxysmal atrial fibrillation: Principal | ICD-10-CM

## 2022-02-27 MED ORDER — OMEPRAZOLE 20 MG CAPSULE,DELAYED RELEASE
ORAL_CAPSULE | Freq: Every day | ORAL | 3 refills | 90 days
Start: 2022-02-27 — End: ?

## 2022-02-27 MED ORDER — DILTIAZEM ER 360 MG CAPSULE,24 HR,EXTENDED RELEASE
ORAL_CAPSULE | ORAL | 1 refills | 90.00000 days
Start: 2022-02-27 — End: ?

## 2022-03-02 MED ORDER — OMEPRAZOLE 20 MG CAPSULE,DELAYED RELEASE
ORAL_CAPSULE | Freq: Every day | ORAL | 3 refills | 90 days
Start: 2022-03-02 — End: ?

## 2022-03-03 DIAGNOSIS — M47812 Spondylosis without myelopathy or radiculopathy, cervical region: Secondary | ICD-10-CM | POA: Diagnosis not present

## 2022-03-10 ENCOUNTER — Other Ambulatory Visit: Payer: Self-pay | Admitting: Neurosurgery

## 2022-03-16 MED ORDER — OMEPRAZOLE 20 MG CAPSULE,DELAYED RELEASE
ORAL_CAPSULE | Freq: Every day | ORAL | 3 refills | 0 days
Start: 2022-03-16 — End: ?

## 2022-03-16 NOTE — Progress Notes (Signed)
Surgical Instructions    Your procedure is scheduled on Thursday, March 1.  Report to [facility] Main Entrance "A" at 11:00 A.M., then check in with the Admitting office.  Call this number if you have problems the morning of surgery:  [phone]   If you have any questions prior to your surgery date call [phone]: Open Monday-Friday 8am-4pm If you experience any cold or flu symptoms such as cough, fever, chills, shortness of breath, etc. between now and your scheduled surgery, please notify us at the above number     Remember:  Do not eat after midnight the night before your surgery  You may drink clear liquids until 10:00 the morning of your surgery.   Clear liquids allowed are: Water, Non-Citrus Juices (without pulp), Carbonated Beverages, Clear Tea, Black Coffee ONLY (NO MILK, CREAM OR POWDERED CREAMER of any kind), and Gatorade    Take these medicines the morning of surgery with A SIP OF WATER:  Fexofenadine HCl (ALLEGRA PO)  gabapentin (NEURONTIN)  pantoprazole (PROTONIX)  tiZANidine (ZANAFLEX) if needed  Follow your surgeon's instructions on when to stop Aspirin.  If no instructions were given by your surgeon then you will need to call the office to get those instructions.    Please hold naltrexone (Depade) for 3 days prior to surgery.   As of today, STOP taking any diclofenac Sodium (VOLTAREN) 1 % GEL, Aleve, Naproxen, Ibuprofen, Motrin, Advil, Goody's, BC's, all herbal medications, fish oil, and all vitamins.       [facility] is not responsible for any belongings or valuables.    Do NOT Smoke (Tobacco/Vaping)  24 hours prior to your procedure  If you use a CPAP at night, you may bring your mask for your overnight stay.   Contacts, glasses, hearing aids, dentures or partials may not be worn into surgery, please bring cases for these belongings   For patients admitted to the hospital, discharge time will be determined by your treatment team.   Patients  discharged the day of surgery will not be allowed to drive home, and someone needs to stay with them for 24 hours.   SURGICAL WAITING ROOM VISITATION Patients having surgery or a procedure may have no more than 2 support people in the waiting area - these visitors may rotate.   Children under the age of 20 must have an adult with them who is not the patient. If the patient needs to stay at the hospital during part of their recovery, the visitor guidelines for inpatient rooms apply. Pre-op nurse will coordinate an appropriate time for 1 support person to accompany patient in pre-op.  This support person may not rotate.   Please refer to [email or web address] for the visitor guidelines for Inpatients (after your surgery is over and you are in a regular room).    Special instructions:    Oral Hygiene is also important to reduce your risk of infection.  Remember - BRUSH YOUR TEETH THE MORNING OF SURGERY WITH YOUR REGULAR TOOTHPASTE   [facility]- Preparing For Surgery  Before surgery, you can play an important role. Because skin is not sterile, your skin needs to be as free of germs as possible. You can reduce the number of germs on your skin by washing with CHG (chlorahexidine gluconate) Soap before surgery.  CHG is an antiseptic cleaner which kills germs and bonds with the skin to continue killing germs even after washing.     Please do not use if you have an allergy to  CHG or antibacterial soaps. If your skin becomes reddened/irritated stop using the CHG.  Do not shave (including legs and underarms) for at least 48 hours prior to first CHG shower. It is OK to shave your face.  Please follow these instructions carefully.     Shower the NIGHT BEFORE SURGERY and the MORNING OF SURGERY with CHG Soap.   If you chose to wash your hair, wash your hair first as usual with your normal shampoo. After you shampoo, rinse your hair and body thoroughly to  remove the shampoo.  Then [employer] and genitals (private parts) with your normal soap and rinse thoroughly to remove soap.  After that Use CHG Soap as you would any other liquid soap. You can apply CHG directly to the skin and wash gently with a scrungie or a clean washcloth.   Apply the CHG Soap to your body ONLY FROM THE NECK DOWN.  Do not use on open wounds or open sores. Avoid contact with your eyes, ears, mouth and genitals (private parts). Wash Face and genitals (private parts)  with your normal soap.   Wash thoroughly, paying special attention to the area where your surgery will be performed.  Thoroughly rinse your body with warm water from the neck down.  DO NOT shower/wash with your normal soap after using and rinsing off the CHG Soap.  Pat yourself dry with a CLEAN TOWEL.  Wear CLEAN PAJAMAS to bed the night before surgery  Place CLEAN SHEETS on your bed the night before your surgery  DO NOT SLEEP WITH PETS.   Day of Surgery:  Take a shower with CHG soap. Wear Clean/Comfortable clothing the morning of surgery Do not wear jewelry or makeup. Do not wear lotions, powders, perfumes/cologne or deodorant. Do not shave 48 hours prior to surgery.  Men may shave face and neck. Do not bring valuables to the hospital. Do not wear nail polish, gel polish, artificial nails, or any other type of covering on natural nails (fingers and toes) If you have artificial nails or gel coating that need to be removed by a nail salon, please have this removed prior to surgery. Artificial nails or gel coating may interfere with anesthesia's ability to adequately monitor your vital signs.  Remember to brush your teeth WITH YOUR REGULAR TOOTHPASTE.    If you received a COVID test during your pre-op visit, it is requested that you wear a mask when out in public, stay away from anyone that may not be feeling well, and notify your surgeon if you develop symptoms. If you have been in contact with anyone  that has tested positive in the last 10 days, please notify your surgeon.    Please read over the following fact sheets that you were given.

## 2022-03-17 ENCOUNTER — Telehealth: Payer: Self-pay | Admitting: Nurse Practitioner

## 2022-03-17 ENCOUNTER — Encounter (HOSPITAL_COMMUNITY): Payer: Self-pay

## 2022-03-17 ENCOUNTER — Other Ambulatory Visit: Payer: Self-pay

## 2022-03-17 ENCOUNTER — Encounter (HOSPITAL_COMMUNITY)
Admission: RE | Admit: 2022-03-17 | Discharge: 2022-03-17 | Disposition: A | Discharge status: Home / Self Care | Payer: Medicare HMO | Source: Ambulatory Visit | Attending: Neurosurgery | Admitting: Neurosurgery

## 2022-03-17 VITALS — BP 112/75 | HR 73 | Temp 97.6°F | Resp 18 | Ht 72.0 in | Wt 182.7 lb

## 2022-03-17 DIAGNOSIS — Z01818 Encounter for other preprocedural examination: Secondary | ICD-10-CM

## 2022-03-17 DIAGNOSIS — Z01812 Encounter for preprocedural laboratory examination: Secondary | ICD-10-CM | POA: Insufficient documentation

## 2022-03-17 DIAGNOSIS — M47812 Spondylosis without myelopathy or radiculopathy, cervical region: Secondary | ICD-10-CM | POA: Diagnosis not present

## 2022-03-17 DIAGNOSIS — F1021 Alcohol dependence, in remission: Secondary | ICD-10-CM | POA: Insufficient documentation

## 2022-03-17 DIAGNOSIS — M4802 Spinal stenosis, cervical region: Secondary | ICD-10-CM | POA: Insufficient documentation

## 2022-03-17 DIAGNOSIS — F319 Bipolar disorder, unspecified: Secondary | ICD-10-CM | POA: Diagnosis not present

## 2022-03-17 DIAGNOSIS — M47817 Spondylosis without myelopathy or radiculopathy, lumbosacral region: Secondary | ICD-10-CM | POA: Insufficient documentation

## 2022-03-17 DIAGNOSIS — I491 Atrial premature depolarization: Secondary | ICD-10-CM | POA: Diagnosis not present

## 2022-03-17 DIAGNOSIS — M48061 Spinal stenosis, lumbar region without neurogenic claudication: Secondary | ICD-10-CM | POA: Diagnosis not present

## 2022-03-17 DIAGNOSIS — G4733 Obstructive sleep apnea (adult) (pediatric): Secondary | ICD-10-CM | POA: Diagnosis not present

## 2022-03-17 DIAGNOSIS — M4856XA Collapsed vertebra, not elsewhere classified, lumbar region, initial encounter for fracture: Secondary | ICD-10-CM | POA: Diagnosis not present

## 2022-03-17 DIAGNOSIS — K219 Gastro-esophageal reflux disease without esophagitis: Secondary | ICD-10-CM | POA: Insufficient documentation

## 2022-03-17 DIAGNOSIS — I480 Paroxysmal atrial fibrillation: Secondary | ICD-10-CM | POA: Diagnosis not present

## 2022-03-17 HISTORY — DX: Cardiac arrhythmia, unspecified: I49.9

## 2022-03-17 HISTORY — DX: Unspecified osteoarthritis, unspecified site: M19.90

## 2022-03-17 HISTORY — DX: Gastro-esophageal reflux disease without esophagitis: K21.9

## 2022-03-17 HISTORY — DX: Pneumonia, unspecified organism: J18.9

## 2022-03-17 HISTORY — DX: Bipolar disorder, unspecified: F31.9

## 2022-03-17 HISTORY — DX: Essential (primary) hypertension: I10

## 2022-03-17 LAB — BASIC METABOLIC PANEL
Anion gap: 9 (ref 5–15)
BUN: 8 mg/dL (ref 6–20)
CO2: 25 mmol/L (ref 22–32)
Calcium: 9 mg/dL (ref 8.9–10.3)
Chloride: 98 mmol/L (ref 98–111)
Creatinine, Ser: 0.76 mg/dL (ref 0.61–1.24)
GFR, Estimated: >60 mL/min (ref >60)
Glucose, Bld: 90 mg/dL (ref 70–99)
Potassium: 4.4 mmol/L (ref 3.5–5.1)
Sodium: 132 mmol/L — ABNORMAL LOW (ref 135–145)

## 2022-03-17 LAB — CBC
HCT: 39.9 % (ref 39.0–52.0)
Hemoglobin: 14.1 g/dL (ref 13.0–17.0)
MCH: 32.7 pg (ref 26.0–34.0)
MCHC: 35.3 g/dL (ref 30.0–36.0)
MCV: 92.6 fL (ref 80.0–100.0)
Platelets: 271 10*3/uL (ref 150–400)
RBC: 4.31 MIL/uL (ref 4.22–5.81)
RDW: 13.3 % (ref 11.5–15.5)
WBC: 9.1 10*3/uL (ref 4.0–10.5)
nRBC: 0 % (ref 0.0–0.2)

## 2022-03-17 LAB — TYPE AND SCREEN
ABO/RH(D): O POS
Antibody Screen: NEGATIVE

## 2022-03-17 LAB — SURGICAL PCR SCREEN
MRSA, PCR: NEGATIVE
Staphylococcus aureus: POSITIVE — AB

## 2022-03-17 MED ORDER — OMEPRAZOLE 20 MG CAPSULE,DELAYED RELEASE
ORAL_CAPSULE | Freq: Every day | ORAL | 3 refills | 90 days
Start: 2022-03-17 — End: ?

## 2022-03-17 NOTE — Progress Notes (Signed)
PCP - Dewaine Conger. [place] FNP Cardiologist - denies Neurologist - Amadou Camara,MD  PPM/ICD - denies Device Orders -  Rep Notified -   Chest x-ray -na  EKG - 09/25/21 Stress Test - denies ECHO - 05/23/20 Cardiac Cath - denies  Sleep Study - pt says he was diagnosed with sleep apnea but doesn't use a CPAP. He can't remember when the study was and no results found in EPIC or Care Everywhere CPAP -   Fasting Blood Sugar - na Checks Blood Sugar _____ times a day  Last dose of GLP1 agonist-  na GLP1 instructions:   Blood Thinner Instructions:na Aspirin Instructions:Pt states he was instructed to stop his aspirin on 2/25.  ERAS Protcol - clear liquids until 1000 PRE-SURGERY Ensure or G2- no  COVID TEST- na   Anesthesia review: yes-history of PAF  Patient denies shortness of breath, fever, cough and chest pain at PAT appointment   All instructions explained to the patient, with a verbal understanding of the material. Patient agrees to go over the instructions while at home for a better understanding. Patient also instructed to wear a mask when out in public prior to surgery.. The opportunity to ask questions was provided.

## 2022-03-17 NOTE — Telephone Encounter (Signed)
Caller & Relationship to patient:  Self MRN #  [id number]   Call Back Number: [phone]  Date of Last Office Visit: 02/24/2022     Date of Next Office Visit: Visit date not found    Medication(s) to be Refilled: Omeprozole  [facility]acy: [facility] on [address]  Marland Kitchen Please notify patient to allow 48-72 hours to process** **Let patient know to contact pharmacy at the end of the day to make sure medication is ready. ** **If patient has not been seen in a year or longer, book an appointment **Advise to use MyChart for refill requests OR to contact their pharmacy

## 2022-03-17 NOTE — Telephone Encounter (Signed)
Caller & Relationship to patient:  MRN #  [id number]   Call Back Number:   Date of Last Office Visit: 03/17/2022     Date of Next Office Visit: Visit date not found    Medication(s) to be Refilled: Diltiazem  Preferred Pharmacy: [facility] on [address]  ** Please notify patient to allow 48-72 hours to process** **Let patient know to contact pharmacy at the end of the day to make sure medication is ready. ** **If patient has not been seen in a year or longer, book an appointment **Advise to use MyChart for refill requests OR to contact their pharmacy

## 2022-03-17 NOTE — Progress Notes (Addendum)
Surgical Instructions    Your procedure is scheduled on Friday, March 1.  Report to [facility] Main Entrance "A" at 11:00 A.M., then check in with the Admitting office.  Call this number if you have problems the morning of surgery:  [phone]   If you have any questions prior to your surgery date call [phone]: Open Monday-Friday 8am-4pm If you experience any cold or flu symptoms such as cough, fever, chills, shortness of breath, etc. between now and your scheduled surgery, please notify us at the above number     Remember:  Do not eat after midnight the night before your surgery  You may drink clear liquids until 10:00 the morning of your surgery.   Clear liquids allowed are: Water, Non-Citrus Juices (without pulp), Carbonated Beverages, Clear Tea, Black Coffee ONLY (NO MILK, CREAM OR POWDERED CREAMER of any kind), and Gatorade    Take these medicines the morning of surgery with A SIP OF WATER:   diltiazem (CARDIZEM CD)  gabapentin (NEURONTIN)  pantoprazole (PROTONIX)  tiZANidine (ZANAFLEX) if needed Fexofenadine HCl (ALLEGRA PO)   Follow your surgeon's instructions on when to stop Aspirin.  If no instructions were given by your surgeon then you will need to call the office to get those instructions.    Please hold naltrexone (Depade) for 3 days prior to surgery.   As of today, STOP taking any diclofenac Sodium (VOLTAREN) 1 % GEL, Aleve, Naproxen, Ibuprofen, Motrin, Advil, Goody's, BC's, all herbal medications, fish oil, and all vitamins.       [facility] is not responsible for any belongings or valuables.    Do NOT Smoke (Tobacco/Vaping)  24 hours prior to your procedure  If you use a CPAP at night, you may bring your mask for your overnight stay.   Contacts, glasses, hearing aids, dentures or partials may not be worn into surgery, please bring cases for these belongings   For patients admitted to the hospital, discharge time will be determined by your treatment  team.   Patients discharged the day of surgery will not be allowed to drive home, and someone needs to stay with them for 24 hours.   SURGICAL WAITING ROOM VISITATION Patients having surgery or a procedure may have no more than 2 support people in the waiting area - these visitors may rotate.   Children under the age of 81 must have an adult with them who is not the patient. If the patient needs to stay at the hospital during part of their recovery, the visitor guidelines for inpatient rooms apply. Pre-op nurse will coordinate an appropriate time for 1 support person to accompany patient in pre-op.  This support person may not rotate.   Please refer to [email or web address] for the visitor guidelines for Inpatients (after your surgery is over and you are in a regular room).    Special instructions:    Oral Hygiene is also important to reduce your risk of infection.  Remember - BRUSH YOUR TEETH THE MORNING OF SURGERY WITH YOUR REGULAR TOOTHPASTE   [place]- Preparing For Surgery  Before surgery, you can play an important role. Because skin is not sterile, your skin needs to be as free of germs as possible. You can reduce the number of germs on your skin by washing with CHG (chlorahexidine gluconate) Soap before surgery.  CHG is an antiseptic cleaner which kills germs and bonds with the skin to continue killing germs even after washing.     Please do not use if  you have an allergy to CHG or antibacterial soaps. If your skin becomes reddened/irritated stop using the CHG.  Do not shave (including legs and underarms) for at least 48 hours prior to first CHG shower. It is OK to shave your face.  Please follow these instructions carefully.     Shower the NIGHT BEFORE SURGERY and the MORNING OF SURGERY with CHG Soap.   If you chose to wash your hair, wash your hair first as usual with your normal shampoo. After you shampoo, rinse your hair and  body thoroughly to remove the shampoo.  Then [employer] and genitals (private parts) with your normal soap and rinse thoroughly to remove soap.  After that Use CHG Soap as you would any other liquid soap. You can apply CHG directly to the skin and wash gently with a scrungie or a clean washcloth.   Apply the CHG Soap to your body ONLY FROM THE NECK DOWN.  Do not use on open wounds or open sores. Avoid contact with your eyes, ears, mouth and genitals (private parts). Wash Face and genitals (private parts)  with your normal soap.   Wash thoroughly, paying special attention to the area where your surgery will be performed.  Thoroughly rinse your body with warm water from the neck down.  DO NOT shower/wash with your normal soap after using and rinsing off the CHG Soap.  Pat yourself dry with a CLEAN TOWEL.  Wear CLEAN PAJAMAS to bed the night before surgery  Place CLEAN SHEETS on your bed the night before your surgery  DO NOT SLEEP WITH PETS.   Day of Surgery:  Take a shower with CHG soap. Wear Clean/Comfortable clothing the morning of surgery Do not wear jewelry or makeup. Do not wear lotions, powders, perfumes/cologne or deodorant. Do not shave 48 hours prior to surgery.  Men may shave face and neck. Do not bring valuables to the hospital. Do not wear nail polish, gel polish, artificial nails, or any other type of covering on natural nails (fingers and toes) If you have artificial nails or gel coating that need to be removed by a nail salon, please have this removed prior to surgery. Artificial nails or gel coating may interfere with anesthesia's ability to adequately monitor your vital signs.  Remember to brush your teeth WITH YOUR REGULAR TOOTHPASTE.    If you received a COVID test during your pre-op visit, it is requested that you wear a mask when out in public, stay away from anyone that may not be feeling well, and notify your surgeon if you develop symptoms. If you have been in  contact with anyone that has tested positive in the last 10 days, please notify your surgeon.    Please read over the following fact sheets that you were given.

## 2022-03-18 NOTE — Progress Notes (Signed)
Anesthesia Chart Review:  Case: [id number] Date/Time: 03/20/22 1253   Procedure: C3-4, C4-5 ACDF - RM 20 to follow 3C   Anesthesia type: General   Pre-op diagnosis: Cervical spondylosis without myelopathy   Location: [facility] OR ROOM 20 / [place] OR   Surgeons: Ashok Pall, MD       DISCUSSION: Patient is a 57 year old male scheduled for the above procedure. Dx: Cervical spondylosis and myelopathy.  History includes smoking, seizures (diagnosed 12/2018), PAF (afib with RVR 05/19/20 in setting of alcohol withdrawal, echo showed normal LVEF, chads2vasc score 1, Rx ASA & Cardizem), OSA (severe OSA 2017; does not use CPAP), psoriatic arthritis, GERD, C. difficile (05/20/20), bipolar disorder, left carpal tunnel release. Prior head trauma and alcoholism in remission (quit ~ 10/2021; had been drinking 1 pint liquor daily) are also listed in neurology notes. He is on naltrexone.   10/15/21 - 10/423 [place] admission for ambulatory dysfunction--unable to walk with muscle wasting.  MRI lumbar spine showed severe L4-L5 spinal canal stenosis and mild to moderate canal and foraminal stenosis elsewhere in the lumbar region with chronic L5 decompression fracture. Neurosurgeon Dr. Christella Noa consulted and recommended rehab therapies. Also with tremor with underlying alcohol abuse/use disorder and was initially managed on CIWA protocol then as needed Ativan without withdrawal symptoms. Transaminitis and mild thrombocytopenia thought related to alcohol use. CIR Admission 10/22/21-11/01/21. PLT and AST and ALT normalized 10/23/21. He had evaluation with neurology on 01/05/22 for seizures and cervical radiculopathy. 02/15/22 MIR showed prominent spondylitic changes throughout most severe at C3/4 and C4-5 where there is multi factorial spinal stenosis with cord compression and severe bilateral foraminal narrowing and possible encroachment on the exiting nerve roots. He was referred back to neurosurgery.   He reported instructions to  hold ASA starting 03/15/22.  Anesthesia team to evaluate on the day of surgery.    VS: BP 112/75   Pulse 73   Temp 36.4 C (Oral)   Resp 18   Ht 6' (1.829 m)   Wt 82.9 kg   SpO2 99%   BMI 24.78 kg/m    PROVIDERS: Renee Rival, FNP is PCP [facility] [place]) Alric Ran, MD is neurologist, established 01/05/22. Berniece Andreas, MD is psychiatrist   LABS: Labs reviewed: Acceptable for surgery. Normal AST and ALT on 10/23/21.  (all labs ordered are listed, but only abnormal results are displayed)  Labs Reviewed  SURGICAL PCR SCREEN - Abnormal; Notable for the following components:      Result Value   Staphylococcus aureus POSITIVE (*)    All other components within normal limits  BASIC METABOLIC PANEL - Abnormal; Notable for the following components:   Sodium 132 (*)    All other components within normal limits  CBC  TYPE AND SCREEN    OTHER: EEG 08/16/20 [facility]): IMPRESSION: This EEG is borderline abnormal for some mild background slowing.  CLINICAL INTERPRETATION:  Diffuse slowing is indicative of bihemispheric dysfunction which could be  on the basis of a toxic, metabolic, or primary neuronal disorder.  Sleep Study 07/06/15 [facility] CE): IMPRESSION:  In this split night study:  1. Severe obstructive sleep apnea (OSA) - overall AHI = 62.  The  respiratory events were associated with arousals and oxygen saturation to  a low of 62%.  The total time spent with O2 saturation < 89% was 34  minutes.  2. Severe periodic limb movements that persisted after OSA was corrected.  3. In the PAP titration portion of the study, the  patient's OSA was  corrected by CPAP at level of 10 cm H2O, however this final pressure was  not tested in REM supine sleep.   RECOMMENDATIONS:  1. CPAP at level of 10 cm H2O and with heated humidifier for nasal dryness, mask: [employer]: Eason size medium...    IMAGES: MRI C-spine 02/15/22: IMPRESSION: Abnormal MRI scan  cervical spine without contrast showing prominent spondylitic changes throughout most severe at C3/4 and C4-5 where there is multi factorial spinal stenosis with cord compression and severe bilateral foraminal narrowing and possible encroachment on the exiting nerve roots.  There are also ill-defined spinal cord hyperintensities at C2, C4 and C6 which are of unclear significance and may represent remote age demyelinating plaques.   MRI L-spine 02/15/22: IMPRESSION: 1. L4-L5 severe spinal canal stenosis and mild bilateral neural foraminal narrowing. 2. L1-L2 and L2-L3 moderate spinal canal stenosis, with moderate right neural foraminal narrowing at L1-L2. 3. L3-L4 mild-to-moderate spinal canal stenosis and mild-to-moderate right neural foraminal narrowing. 4. Effacement of the lateral recesses at L1-L2, L2-L3, L3-L4, and L4-L5 likely compresses the descending L2, L3, L4, and L5 nerve roots, respectively. 5. Multilevel facet arthropathy, which is worst at L5-S1, where it is severe. Widening of the left facets at L3-L4 can indicate instability. 6. Chronic compression deformity of L5, with approximately 75% vertebral body height loss centrally and 3 mm retropulsion of the posterosuperior cortex of L5.  MRI Brain 08/16/20 [facility]): No acute intracranial abnormality. Chronic microvascular and atrophic changes as noted above.   US Liver 07/09/20 [facility] CE): --Patent hepatic vasculature with normal flow direction. Mildly sluggish flow is noted in left portal vein.  --Increased hepatic echogenicity, can be seen with hepatic steatosis.    EKG: 10/15/21: Sinus rhythm Atrial premature complexes Low voltage, precordial leads Abnormal R-wave progression, early transition No previous ECGs available Confirmed by Leanord Asal ([id number]) on 10/15/2021 9:51:32 PM   CV: Echo (Limited) 05/23/20 [facility]): Summary   1. Limited study to assess ventricular function.    2. The left ventricle is normal in size  with normal wall thickness.    3. The left ventricular systolic function is normal, LVEF is visually  estimated at > 55%.    4. Mitral annular calcification is present (mild).    5. The right ventricle is mildly dilated in size, with normal systolic  function.   Echo 02/26/20 [facility] CE): Summary   1. The left ventricle is normal in size with normal wall thickness.    2. The left ventricular systolic function is normal, LVEF is visually  estimated at 65-70%.    3. The right ventricle is normal in size, with normal systolic function.    4. The left atrium is moderately dilated in size.    5. IVC size and inspiratory change suggest mildly elevated right atrial  pressure. (5-10 mmHg).    6. Pulmonary systolic pressure cannot be estimated due to insufficient TR  jet.    Past Medical History:  Diagnosis Date   Arthritis    psoriatic arthritis   Bipolar disorder ([place])    Dysrhythmia    PAF   GERD (gastroesophageal reflux disease)    Hypertension    Pneumonia    Seizures (HCC)     Past Surgical History:  Procedure Laterality Date   HAND SURGERY Left    carpal tunnel release    MEDICATIONS:  aspirin EC 81 MG tablet   Calcium Carb-Cholecalciferol (CALCIUM 600+D3 PO)   citalopram (CELEXA) 10 MG  tablet   diclofenac Sodium (VOLTAREN) 1 % GEL   diltiazem (CARDIZEM CD) 180 MG 24 hr capsule   Fexofenadine HCl (ALLEGRA PO)   folic acid (FOLVITE) 1 MG tablet   folic acid (FOLVITE) A999333 MCG tablet   gabapentin (NEURONTIN) 300 MG capsule   lamoTRIgine (LAMICTAL) 100 MG tablet   melatonin 3 MG TABS tablet   Multiple Vitamin (MULTIVITAMIN) tablet   naltrexone (DEPADE) 50 MG tablet   OLANZapine (ZYPREXA) 20 MG tablet   pantoprazole (PROTONIX) 40 MG tablet   polyethylene glycol powder (GLYCOLAX/MIRALAX) 17 GM/SCOOP powder   pyridOXINE (VITAMIN B6) 100 MG tablet   sodium chloride 1 g tablet   thiamine (VITAMIN B1) 100 MG tablet   tiZANidine (ZANAFLEX) 4 MG tablet   No current  facility-administered medications for this encounter.  Folic acid 1 mg is listed as not taking.   Myra Gianotti, PA-C Surgical Short Stay/Anesthesiology [facility] Phone [phone] [facility] Phone [phone] 03/18/2022 3:43 PM

## 2022-03-18 NOTE — Anesthesia Preprocedure Evaluation (Addendum)
Anesthesia Evaluation  Patient identified by MRN, date of birth, ID band Patient awake    Reviewed: Allergy & Precautions, NPO status , Patient's Chart, lab work & pertinent test results  Airway Mallampati: II  TM Distance: >3 FB Neck ROM: Limited    Dental no notable dental hx.    Pulmonary Current Smoker and Patient abstained from smoking.   Pulmonary exam normal        Cardiovascular hypertension, Pt. on medications  Rhythm:Regular Rate:Normal  Echo (Limited) 05/23/20 ([facility]): Summary   1. Limited study to assess ventricular function.    2. The left ventricle is normal in size with normal wall thickness.    3. The left ventricular systolic function is normal, LVEF is visually  estimated at > 55%.    4. Mitral annular calcification is present (mild).    5. The right ventricle is mildly dilated in size, with normal systolic  function.     Neuro/Psych Seizures -, Well Controlled,   Anxiety  Bipolar Disorder      GI/Hepatic Neg liver ROS,GERD  Medicated,,  Endo/Other  negative endocrine ROS    Renal/GU negative Renal ROS  negative genitourinary   Musculoskeletal  (+) Arthritis , Osteoarthritis,    Abdominal Normal abdominal exam  (+)   Peds  Hematology  (+) Blood dyscrasia, anemia   Anesthesia Other Findings   Reproductive/Obstetrics                              Anesthesia Physical Anesthesia Plan  ASA: 3  Anesthesia Plan: General   Post-op Pain Management:    Induction: Intravenous  PONV Risk Score and Plan: 1 and Ondansetron, Dexamethasone, Midazolam and Treatment may vary due to age or medical condition  Airway Management Planned: Mask, Oral ETT and Video Laryngoscope Planned  Additional Equipment: None  Intra-op Plan:   Post-operative Plan:   Informed Consent: I have reviewed the patients History and Physical, chart, labs and discussed the procedure including the  risks, benefits and alternatives for the proposed anesthesia with the patient or authorized representative who has indicated his/her understanding and acceptance.     Dental advisory given  Plan Discussed with: CRNA  Anesthesia Plan Comments: (PAT note written 03/18/2022 by Myra Gianotti, PA-C.  )        Anesthesia Quick Evaluation

## 2022-03-20 ENCOUNTER — Other Ambulatory Visit: Payer: Self-pay

## 2022-03-20 ENCOUNTER — Ambulatory Visit (HOSPITAL_BASED_OUTPATIENT_CLINIC_OR_DEPARTMENT_OTHER): Payer: Medicare HMO | Admitting: Certified Registered Nurse Anesthetist

## 2022-03-20 ENCOUNTER — Ambulatory Visit (HOSPITAL_COMMUNITY): Payer: Medicare HMO

## 2022-03-20 ENCOUNTER — Encounter (HOSPITAL_COMMUNITY)
Admission: RE | Disposition: A | Discharge status: Home / Self Care | Payer: Self-pay | Source: Home / Self Care | Attending: Neurosurgery

## 2022-03-20 ENCOUNTER — Ambulatory Visit (HOSPITAL_COMMUNITY)
Admission: RE | Admit: 2022-03-20 | Discharge: 2022-03-21 | Disposition: A | Discharge status: Home / Self Care | Payer: Medicare HMO | Attending: Neurosurgery | Admitting: Neurosurgery

## 2022-03-20 ENCOUNTER — Ambulatory Visit (HOSPITAL_COMMUNITY): Payer: Medicare HMO | Admitting: Vascular Surgery

## 2022-03-20 ENCOUNTER — Encounter (HOSPITAL_COMMUNITY): Payer: Self-pay | Admitting: Neurosurgery

## 2022-03-20 DIAGNOSIS — M1990 Unspecified osteoarthritis, unspecified site: Secondary | ICD-10-CM | POA: Diagnosis not present

## 2022-03-20 DIAGNOSIS — M4802 Spinal stenosis, cervical region: Secondary | ICD-10-CM | POA: Diagnosis not present

## 2022-03-20 DIAGNOSIS — F1020 Alcohol dependence, uncomplicated: Secondary | ICD-10-CM | POA: Diagnosis not present

## 2022-03-20 DIAGNOSIS — F17200 Nicotine dependence, unspecified, uncomplicated: Secondary | ICD-10-CM | POA: Diagnosis not present

## 2022-03-20 DIAGNOSIS — F319 Bipolar disorder, unspecified: Secondary | ICD-10-CM | POA: Insufficient documentation

## 2022-03-20 DIAGNOSIS — M47812 Spondylosis without myelopathy or radiculopathy, cervical region: Secondary | ICD-10-CM

## 2022-03-20 DIAGNOSIS — I10 Essential (primary) hypertension: Secondary | ICD-10-CM | POA: Insufficient documentation

## 2022-03-20 DIAGNOSIS — K219 Gastro-esophageal reflux disease without esophagitis: Secondary | ICD-10-CM | POA: Diagnosis not present

## 2022-03-20 DIAGNOSIS — F17210 Nicotine dependence, cigarettes, uncomplicated: Secondary | ICD-10-CM

## 2022-03-20 DIAGNOSIS — Z79899 Other long term (current) drug therapy: Secondary | ICD-10-CM | POA: Diagnosis not present

## 2022-03-20 DIAGNOSIS — Z7982 Long term (current) use of aspirin: Secondary | ICD-10-CM | POA: Insufficient documentation

## 2022-03-20 DIAGNOSIS — G992 Myelopathy in diseases classified elsewhere: Secondary | ICD-10-CM | POA: Diagnosis present

## 2022-03-20 DIAGNOSIS — M4322 Fusion of spine, cervical region: Secondary | ICD-10-CM | POA: Diagnosis not present

## 2022-03-20 DIAGNOSIS — M4712 Other spondylosis with myelopathy, cervical region: Secondary | ICD-10-CM | POA: Diagnosis not present

## 2022-03-20 DIAGNOSIS — G40909 Epilepsy, unspecified, not intractable, without status epilepticus: Secondary | ICD-10-CM | POA: Insufficient documentation

## 2022-03-20 DIAGNOSIS — F419 Anxiety disorder, unspecified: Secondary | ICD-10-CM | POA: Diagnosis not present

## 2022-03-20 HISTORY — PX: ANTERIOR CERVICAL DECOMP/DISCECTOMY FUSION: SHX1161

## 2022-03-20 LAB — ABO/RH: ABO/RH(D): O POS

## 2022-03-20 SURGERY — ANTERIOR CERVICAL DECOMPRESSION/DISCECTOMY FUSION 2 LEVELS
Anesthesia: General

## 2022-03-20 MED ORDER — MIDAZOLAM HCL 2 MG/2ML IJ SOLN
INTRAMUSCULAR | Status: AC
  Filled 2022-03-20: qty 2

## 2022-03-20 MED ORDER — ROCURONIUM BROMIDE 10 MG/ML (PF) SYRINGE
PREFILLED_SYRINGE | INTRAVENOUS | Status: AC
  Filled 2022-03-20: qty 20

## 2022-03-20 MED ORDER — TIZANIDINE HCL 4 MG PO TABS
4.0000 mg | ORAL_TABLET | Freq: Three times a day (TID) | ORAL | Status: DC | PRN
  Administered 2022-03-20: 4 mg via ORAL
  Filled 2022-03-20: qty 1

## 2022-03-20 MED ORDER — CELECOXIB 200 MG PO CAPS
200.0000 mg | ORAL_CAPSULE | Freq: Two times a day (BID) | ORAL | Status: DC
  Administered 2022-03-20: 200 mg via ORAL
  Filled 2022-03-20: qty 1

## 2022-03-20 MED ORDER — SODIUM CHLORIDE 0.9 % IV SOLN: 250.0000 mL | INTRAVENOUS | Status: DC

## 2022-03-20 MED ORDER — FENTANYL CITRATE (PF) 100 MCG/2ML IJ SOLN
INTRAMUSCULAR | Status: DC | PRN
  Administered 2022-03-20: 100 ug via INTRAVENOUS
  Administered 2022-03-20: 50 ug via INTRAVENOUS

## 2022-03-20 MED ORDER — MIDAZOLAM HCL 5 MG/5ML IJ SOLN
INTRAMUSCULAR | Status: DC | PRN
  Administered 2022-03-20: 2 mg via INTRAVENOUS

## 2022-03-20 MED ORDER — CITALOPRAM HYDROBROMIDE 20 MG PO TABS
10.0000 mg | ORAL_TABLET | Freq: Every day | ORAL | Status: DC
  Administered 2022-03-20: 10 mg via ORAL
  Filled 2022-03-20: qty 1

## 2022-03-20 MED ORDER — SUGAMMADEX SODIUM 200 MG/2ML IV SOLN
INTRAVENOUS | Status: DC | PRN
  Administered 2022-03-20: 200 mg via INTRAVENOUS

## 2022-03-20 MED ORDER — OYSTER SHELL CALCIUM/D3 500-5 MG-MCG PO TABS
1.0000 | ORAL_TABLET | Freq: Two times a day (BID) | ORAL | Status: DC
  Administered 2022-03-20: 1 via ORAL
  Filled 2022-03-20: qty 1

## 2022-03-20 MED ORDER — LACTATED RINGERS IV SOLN: INTRAVENOUS | Status: DC | PRN

## 2022-03-20 MED ORDER — HYDROCODONE-ACETAMINOPHEN 10-325 MG PO TABS: 2.0000 | ORAL_TABLET | ORAL | Status: DC | PRN

## 2022-03-20 MED ORDER — KETOROLAC TROMETHAMINE 0.5 % OP SOLN
OPHTHALMIC | Status: AC
  Filled 2022-03-20: qty 5

## 2022-03-20 MED ORDER — ADULT MULTIVITAMIN W/MINERALS CH
1.0000 | ORAL_TABLET | Freq: Every day | ORAL | Status: DC
  Administered 2022-03-20: 1 via ORAL
  Filled 2022-03-20: qty 1

## 2022-03-20 MED ORDER — CHLORHEXIDINE GLUCONATE 0.12 % MT SOLN
15.0000 mL | Freq: Once | OROMUCOSAL | Status: AC
  Administered 2022-03-20: 15 mL via OROMUCOSAL
  Filled 2022-03-20: qty 15

## 2022-03-20 MED ORDER — KETOROLAC TROMETHAMINE 0.5 % OP SOLN
1.0000 [drp] | Freq: Four times a day (QID) | OPHTHALMIC | Status: DC
  Administered 2022-03-20: 1 [drp] via OPHTHALMIC

## 2022-03-20 MED ORDER — POLYETHYLENE GLYCOL 3350 17 GM/SCOOP PO POWD: 17.0000 g | Freq: Two times a day (BID) | ORAL | Status: DC | PRN

## 2022-03-20 MED ORDER — THIAMINE HCL 100 MG PO TABS
100.0000 mg | ORAL_TABLET | Freq: Every day | ORAL | Status: DC
  Filled 2022-03-20: qty 1

## 2022-03-20 MED ORDER — MORPHINE SULFATE (PF) 2 MG/ML IV SOLN: 2.0000 mg | INTRAVENOUS | Status: DC | PRN

## 2022-03-20 MED ORDER — CEFAZOLIN SODIUM-DEXTROSE 2-4 GM/100ML-% IV SOLN
2.0000 g | INTRAVENOUS | Status: AC
  Administered 2022-03-20: 2 g via INTRAVENOUS
  Filled 2022-03-20: qty 100

## 2022-03-20 MED ORDER — PHENYLEPHRINE HCL-NACL 20-0.9 MG/250ML-% IV SOLN
INTRAVENOUS | Status: DC | PRN
  Administered 2022-03-20: 25 ug/min via INTRAVENOUS

## 2022-03-20 MED ORDER — ONDANSETRON HCL 4 MG PO TABS: 4.0000 mg | ORAL_TABLET | Freq: Four times a day (QID) | ORAL | Status: DC | PRN

## 2022-03-20 MED ORDER — ACETAMINOPHEN 650 MG RE SUPP: 650.0000 mg | RECTAL | Status: DC | PRN

## 2022-03-20 MED ORDER — ACETAMINOPHEN 325 MG PO TABS: 650.0000 mg | ORAL_TABLET | ORAL | Status: DC | PRN

## 2022-03-20 MED ORDER — POLYMYXIN B-TRIMETHOPRIM 10000-0.1 UNIT/ML-% OP SOLN
1.0000 [drp] | Freq: Four times a day (QID) | OPHTHALMIC | Status: DC
  Filled 2022-03-20: qty 10

## 2022-03-20 MED ORDER — FOLIC ACID 0.5 MG HALF TAB: 500.0000 ug | ORAL_TABLET | Freq: Every day | ORAL | Status: DC

## 2022-03-20 MED ORDER — DEXAMETHASONE SODIUM PHOSPHATE 10 MG/ML IJ SOLN
INTRAMUSCULAR | Status: AC
  Filled 2022-03-20: qty 1

## 2022-03-20 MED ORDER — PROPOFOL 10 MG/ML IV BOLUS
INTRAVENOUS | Status: DC | PRN
  Administered 2022-03-20: 150 mg via INTRAVENOUS

## 2022-03-20 MED ORDER — FENTANYL CITRATE (PF) 100 MCG/2ML IJ SOLN
25.0000 ug | INTRAMUSCULAR | Status: DC | PRN
  Administered 2022-03-20: 25 ug via INTRAVENOUS
  Administered 2022-03-20: 50 ug via INTRAVENOUS
  Administered 2022-03-20: 25 ug via INTRAVENOUS

## 2022-03-20 MED ORDER — GABAPENTIN 300 MG PO CAPS
300.0000 mg | ORAL_CAPSULE | Freq: Two times a day (BID) | ORAL | Status: DC
  Administered 2022-03-20: 300 mg via ORAL
  Filled 2022-03-20: qty 1

## 2022-03-20 MED ORDER — 0.9 % SODIUM CHLORIDE (POUR BTL) OPTIME
TOPICAL | Status: DC | PRN
  Administered 2022-03-20: 1000 mL

## 2022-03-20 MED ORDER — GELATIN ABSORBABLE 12-7 MM EX MISC
CUTANEOUS | Status: DC | PRN
  Administered 2022-03-20: 1

## 2022-03-20 MED ORDER — KETAMINE HCL 50 MG/5ML IJ SOSY
PREFILLED_SYRINGE | INTRAMUSCULAR | Status: AC
  Filled 2022-03-20: qty 5

## 2022-03-20 MED ORDER — LIDOCAINE-EPINEPHRINE 0.5 %-1:200000 IJ SOLN
INTRAMUSCULAR | Status: AC
  Filled 2022-03-20: qty 50

## 2022-03-20 MED ORDER — BSS IO SOLN: 15.0000 mL | Freq: Once | INTRAOCULAR | Status: DC

## 2022-03-20 MED ORDER — PANTOPRAZOLE SODIUM 40 MG PO TBEC: 40.0000 mg | DELAYED_RELEASE_TABLET | Freq: Every day | ORAL | 0 refills | Status: DC

## 2022-03-20 MED ORDER — HYDROCODONE-ACETAMINOPHEN 7.5-325 MG PO TABS
1.0000 | ORAL_TABLET | ORAL | Status: DC | PRN
  Administered 2022-03-21: 1 via ORAL
  Filled 2022-03-20: qty 1

## 2022-03-20 MED ORDER — ONDANSETRON HCL 4 MG/2ML IJ SOLN: 4.0000 mg | Freq: Four times a day (QID) | INTRAMUSCULAR | Status: DC | PRN

## 2022-03-20 MED ORDER — THROMBIN 5000 UNITS EX SOLR
CUTANEOUS | Status: DC | PRN
  Administered 2022-03-20: 5000 [IU] via TOPICAL

## 2022-03-20 MED ORDER — ACETAMINOPHEN 10 MG/ML IV SOLN: 1000.0000 mg | Freq: Once | INTRAVENOUS | Status: DC | PRN

## 2022-03-20 MED ORDER — SODIUM CHLORIDE 1 G PO TABS
1.0000 g | ORAL_TABLET | Freq: Three times a day (TID) | ORAL | Status: DC
  Filled 2022-03-20 (×2): qty 1

## 2022-03-20 MED ORDER — LORATADINE 10 MG PO TABS
10.0000 mg | ORAL_TABLET | Freq: Every day | ORAL | Status: DC
  Filled 2022-03-20: qty 1

## 2022-03-20 MED ORDER — THROMBIN (RECOMBINANT) 5000 UNITS EX SOLR: CUTANEOUS | Status: DC | PRN

## 2022-03-20 MED ORDER — LIDOCAINE 2% (20 MG/ML) 5 ML SYRINGE
INTRAMUSCULAR | Status: AC
  Filled 2022-03-20: qty 5

## 2022-03-20 MED ORDER — CHLORHEXIDINE GLUCONATE CLOTH 2 % EX PADS: 6.0000 | MEDICATED_PAD | Freq: Once | CUTANEOUS | Status: DC

## 2022-03-20 MED ORDER — ASPIRIN 81 MG PO TBEC
81.0000 mg | DELAYED_RELEASE_TABLET | Freq: Every day | ORAL | Status: DC
  Administered 2022-03-20: 81 mg via ORAL
  Filled 2022-03-20: qty 1

## 2022-03-20 MED ORDER — LIDOCAINE 2% (20 MG/ML) 5 ML SYRINGE
INTRAMUSCULAR | Status: DC | PRN
  Administered 2022-03-20: 60 mg via INTRAVENOUS

## 2022-03-20 MED ORDER — LIDOCAINE-EPINEPHRINE 0.5 %-1:200000 IJ SOLN
INTRAMUSCULAR | Status: DC | PRN
  Administered 2022-03-20: 5 mL

## 2022-03-20 MED ORDER — PHENYLEPHRINE 80 MCG/ML (10ML) SYRINGE FOR IV PUSH (FOR BLOOD PRESSURE SUPPORT)
PREFILLED_SYRINGE | INTRAVENOUS | Status: AC
  Filled 2022-03-20: qty 10

## 2022-03-20 MED ORDER — DEXAMETHASONE SODIUM PHOSPHATE 10 MG/ML IJ SOLN
INTRAMUSCULAR | Status: DC | PRN
  Administered 2022-03-20: 10 mg via INTRAVENOUS

## 2022-03-20 MED ORDER — KETAMINE HCL 10 MG/ML IJ SOLN
INTRAMUSCULAR | Status: DC | PRN
  Administered 2022-03-20: 30 mg via INTRAVENOUS

## 2022-03-20 MED ORDER — SENNA 8.6 MG PO TABS
1.0000 | ORAL_TABLET | Freq: Two times a day (BID) | ORAL | Status: DC
  Administered 2022-03-20: 8.6 mg via ORAL
  Filled 2022-03-20: qty 1

## 2022-03-20 MED ORDER — SODIUM CHLORIDE 0.9% FLUSH: 3.0000 mL | INTRAVENOUS | Status: DC | PRN

## 2022-03-20 MED ORDER — ROCURONIUM BROMIDE 10 MG/ML (PF) SYRINGE
PREFILLED_SYRINGE | INTRAVENOUS | Status: DC | PRN
  Administered 2022-03-20: 10 mg via INTRAVENOUS
  Administered 2022-03-20: 70 mg via INTRAVENOUS
  Administered 2022-03-20: 20 mg via INTRAVENOUS
  Administered 2022-03-20: 30 mg via INTRAVENOUS

## 2022-03-20 MED ORDER — MELATONIN 3 MG PO TABS
3.0000 mg | ORAL_TABLET | Freq: Every day | ORAL | Status: DC
  Administered 2022-03-20: 3 mg via ORAL
  Filled 2022-03-20 (×2): qty 1

## 2022-03-20 MED ORDER — POTASSIUM CHLORIDE IN NACL 20-0.9 MEQ/L-% IV SOLN: INTRAVENOUS | Status: DC

## 2022-03-20 MED ORDER — DILTIAZEM HCL ER COATED BEADS 180 MG PO CP24: 360.0000 mg | ORAL_CAPSULE | Freq: Every day | ORAL | 0 refills | Status: DC

## 2022-03-20 MED ORDER — THROMBIN 5000 UNITS EX SOLR
CUTANEOUS | Status: AC
  Filled 2022-03-20: qty 5000

## 2022-03-20 MED ORDER — FENTANYL CITRATE (PF) 250 MCG/5ML IJ SOLN
INTRAMUSCULAR | Status: AC
  Filled 2022-03-20: qty 5

## 2022-03-20 MED ORDER — PHENOL 1.4 % MT LIQD: 1.0000 | OROMUCOSAL | Status: DC | PRN

## 2022-03-20 MED ORDER — DILTIAZEM HCL ER COATED BEADS 360 MG PO CP24
360.0000 mg | ORAL_CAPSULE | Freq: Every day | ORAL | Status: DC
  Administered 2022-03-20: 360 mg via ORAL
  Filled 2022-03-20 (×3): qty 1

## 2022-03-20 MED ORDER — VITAMIN B-6 100 MG PO TABS
100.0000 mg | ORAL_TABLET | Freq: Every day | ORAL | Status: DC
  Filled 2022-03-20: qty 1

## 2022-03-20 MED ORDER — GELATIN ABSORBABLE 12-7 MM EX MISC: CUTANEOUS | Status: DC | PRN

## 2022-03-20 MED ORDER — SODIUM CHLORIDE 0.9% FLUSH
3.0000 mL | Freq: Two times a day (BID) | INTRAVENOUS | Status: DC
  Administered 2022-03-20: 3 mL via INTRAVENOUS

## 2022-03-20 MED ORDER — LACTATED RINGERS IV SOLN: INTRAVENOUS | Status: DC

## 2022-03-20 MED ORDER — ONDANSETRON HCL 4 MG/2ML IJ SOLN
INTRAMUSCULAR | Status: AC
  Filled 2022-03-20: qty 2

## 2022-03-20 MED ORDER — HYDROCODONE-ACETAMINOPHEN 7.5-325 MG PO TABS
1.0000 | ORAL_TABLET | Freq: Four times a day (QID) | ORAL | Status: DC
  Administered 2022-03-20 – 2022-03-21 (×2): 1 via ORAL
  Filled 2022-03-20 (×2): qty 1

## 2022-03-20 MED ORDER — NALTREXONE HCL 50 MG PO TABS
50.0000 mg | ORAL_TABLET | Freq: Every day | ORAL | Status: DC
  Administered 2022-03-20: 50 mg via ORAL
  Filled 2022-03-20 (×3): qty 1

## 2022-03-20 MED ORDER — LAMOTRIGINE 100 MG PO TABS
200.0000 mg | ORAL_TABLET | Freq: Every day | ORAL | Status: DC
  Administered 2022-03-20: 200 mg via ORAL
  Filled 2022-03-20: qty 2

## 2022-03-20 MED ORDER — OLANZAPINE 10 MG PO TABS
20.0000 mg | ORAL_TABLET | Freq: Every day | ORAL | Status: DC
  Administered 2022-03-20: 20 mg via ORAL
  Filled 2022-03-20 (×2): qty 2

## 2022-03-20 MED ORDER — ORAL CARE MOUTH RINSE: 15.0000 mL | Freq: Once | OROMUCOSAL | Status: AC

## 2022-03-20 MED ORDER — PANTOPRAZOLE SODIUM 40 MG PO TBEC
40.0000 mg | DELAYED_RELEASE_TABLET | Freq: Every day | ORAL | Status: DC
  Filled 2022-03-20: qty 1

## 2022-03-20 MED ORDER — THROMBIN 5000 UNITS EX SOLR: CUTANEOUS | Status: DC | PRN

## 2022-03-20 MED ORDER — FENTANYL CITRATE (PF) 100 MCG/2ML IJ SOLN
INTRAMUSCULAR | Status: AC
  Filled 2022-03-20: qty 2

## 2022-03-20 MED ORDER — MENTHOL 3 MG MT LOZG: 1.0000 | LOZENGE | OROMUCOSAL | Status: DC | PRN

## 2022-03-20 SURGICAL SUPPLY — 47 items
ADH SKN CLS APL DERMABOND .7 (GAUZE/BANDAGES/DRESSINGS) ×1
BAG COUNTER SPONGE SURGICOUNT (BAG) ×1 IMPLANT
BAG SPNG CNTER NS LX DISP (BAG) ×2
BAND INSRT 18 STRL LF DISP RB (MISCELLANEOUS) ×2
BAND RUBBER #18 3X1/16 STRL (MISCELLANEOUS) ×2 IMPLANT
BONE SPACER C-PLUG 14X9X12 (Bone Implant) IMPLANT
BUR DRUM 4.0 (BURR) ×1 IMPLANT
BUR MATCHSTICK NEURO 3.0 LAGG (BURR) ×1 IMPLANT
CANISTER SUCT 3000ML PPV (MISCELLANEOUS) ×1 IMPLANT
DERMABOND ADVANCED .7 DNX12 (GAUZE/BANDAGES/DRESSINGS) ×1 IMPLANT
DRAPE HALF SHEET 40X57 (DRAPES) IMPLANT
DRAPE LAPAROTOMY 100X72 PEDS (DRAPES) ×1 IMPLANT
DRAPE MICROSCOPE SLANT 54X150 (MISCELLANEOUS) ×1 IMPLANT
DURAPREP 6ML APPLICATOR 50/CS (WOUND CARE) ×1 IMPLANT
ELECT COATED BLADE 2.86 ST (ELECTRODE) ×1 IMPLANT
ELECT REM PT RETURN 9FT ADLT (ELECTROSURGICAL) ×1
ELECTRODE REM PT RTRN 9FT ADLT (ELECTROSURGICAL) ×1 IMPLANT
GAUZE 4X4 16PLY ~~LOC~~+RFID DBL (SPONGE) IMPLANT
GLOVE BIOGEL M 7.0 STRL (GLOVE) IMPLANT
GLOVE ECLIPSE 6.5 STRL STRAW (GLOVE) ×1 IMPLANT
GLOVE EXAM NITRILE XL STR (GLOVE) IMPLANT
GOWN STRL REUS W/ TWL LRG LVL3 (GOWN DISPOSABLE) ×3 IMPLANT
GOWN STRL REUS W/ TWL XL LVL3 (GOWN DISPOSABLE) IMPLANT
GOWN STRL REUS W/TWL 2XL LVL3 (GOWN DISPOSABLE) IMPLANT
GOWN STRL REUS W/TWL LRG LVL3 (GOWN DISPOSABLE) ×3
GOWN STRL REUS W/TWL XL LVL3 (GOWN DISPOSABLE) ×1
KIT BASIN OR (CUSTOM PROCEDURE TRAY) ×1 IMPLANT
KIT TURNOVER KIT B (KITS) ×1 IMPLANT
NDL HYPO 25X1 1.5 SAFETY (NEEDLE) ×1 IMPLANT
NDL SPNL 22GX3.5 QUINCKE BK (NEEDLE) ×1 IMPLANT
NEEDLE HYPO 25X1 1.5 SAFETY (NEEDLE) ×1 IMPLANT
NEEDLE SPNL 22GX3.5 QUINCKE BK (NEEDLE) ×1 IMPLANT
NS IRRIG 1000ML POUR BTL (IV SOLUTION) ×1 IMPLANT
PACK LAMINECTOMY NEURO (CUSTOM PROCEDURE TRAY) ×1 IMPLANT
PAD ARMBOARD 7.5X6 YLW CONV (MISCELLANEOUS) ×3 IMPLANT
PLATE ACP 1.6X36 2LVL (Plate) IMPLANT
SCREW ACP VA SD 3.5X15 (Screw) IMPLANT
SCREW ACP VA ST 3.5X15 (Screw) IMPLANT
SPIKE FLUID TRANSFER (MISCELLANEOUS) ×1 IMPLANT
SPONGE INTESTINAL PEANUT (DISPOSABLE) ×1 IMPLANT
SPONGE SURGIFOAM ABS GEL SZ50 (HEMOSTASIS) ×1 IMPLANT
SUT VIC AB 0 CT1 27 (SUTURE) ×1
SUT VIC AB 0 CT1 27XBRD ANTBC (SUTURE) IMPLANT
SUT VIC AB 3-0 SH 8-18 (SUTURE) ×1 IMPLANT
TOWEL GREEN STERILE (TOWEL DISPOSABLE) ×1 IMPLANT
TOWEL GREEN STERILE FF (TOWEL DISPOSABLE) ×1 IMPLANT
WATER STERILE IRR 1000ML POUR (IV SOLUTION) ×1 IMPLANT

## 2022-03-20 NOTE — Telephone Encounter (Signed)
Done KH 

## 2022-03-20 NOTE — Telephone Encounter (Signed)
Pt medication was refilled today pt to make a f/u appt.

## 2022-03-20 NOTE — Transfer of Care (Signed)
Immediate Anesthesia Transfer of Care Note  Patient: Billy Anderson  Procedure(s) Performed: Cervical Three-Four, Cervical Four-Five Anterior Cervical Discectomy Fusion  Patient Location: PACU  Anesthesia Type:General  Level of Consciousness: patient cooperative  Airway & Oxygen Therapy: Patient Spontanous Breathing  Post-op Assessment: Report given to RN and Post -op Vital signs reviewed and stable  Post vital signs: Reviewed and stable  Last Vitals:  Vitals Value Taken Time  BP    Temp    Pulse    Resp    SpO2      Last Pain:  Vitals:   03/20/22 1147  TempSrc:   PainSc: 0-No pain         Complications: No notable events documented.

## 2022-03-20 NOTE — Op Note (Signed)
03/20/2022  6:26 PM  PATIENT:  Billy Anderson  57 y.o. male With fairly severe cervical stenosis at C3/4, and C4/5. He is admitted for cervical decompression and arthrodesis PRE-OPERATIVE DIAGNOSIS:  Cervical spondylosis without myelopathy C3/4,4/5 POST-OPERATIVE DIAGNOSIS:  Cervical spondylosis without myelopathy C3/4,4/5 PROCEDURE:  Anterior Cervical decompression C3/4, 4/5 Arthrodesis C3-5 with 63m structural allografts At C3/4, and C4/5 [employer] Anterior instrumentation(Nuvasive ACP) C4-6  SURGEON:   Surgeon(s): CAshok Pall MD   ASSISTANTS:none3  ANESTHESIA:   general  EBL:  Total I/O In: 1000 [I.V.:1000] Out: 200 [Blood:200]  BLOOD ADMINISTERED:none  CELL SAVER GIVEN:none  COUNT:per nursing  DRAINS: none   SPECIMEN:  No Specimen  DICTATION: Mr. HHaddowwas taken to the operating room, intubated, and placed under general anesthesia without difficulty. He was positioned supine with his head in slight extension on a horseshoe headrest. The neck was prepped and draped in a sterile manner. I infiltrated 4 cc's 1/2%lidocaine/1:200,000 strength epinephrine into the planned incision starting from the midline to the medial border of the left sternocleidomastoid muscle. I opened the incision with a 10 blade and dissected sharply through soft tissue to the platysma. I dissected in the plane superior to the platysma both rostrally and caudally. I then opened the platysma in a horizontal fashion with Metzenbaum scissors, and dissected in the inferior plane rostrally and caudally. With both blunt and sharp technique I created an avascular corridor to the cervical spine. I placed a spinal needle(s) in the disc space at C3/4 . I then reflected the longus colli from C3 to C5 and placed self retaining retractors. I opened the disc space(s) at C3/4, and 4/5 with a 15 blade. I removed disc with curettes, Kerrison punches, and the drill. Using the drill I removed osteophytes and prepared for the  decompression.  I decompressed the spinal canal and the C4, and 5 root(s) with the drill, Kerrison punches, and the curettes. I used the microscope to aid in microdissection. I removed the posterior longitudinal ligament to fully expose and decompress the thecal sac. I exposed the roots laterally taking down the 3/4, and 4/5 uncovertebral joints. With the decompression complete I moved on to the arthrodesis. I used the drill to level the surfaces of C3,4,and 5. I removed soft tissue to prepare the disc space and the bony surfaces. I measured the spaces and placed 813mstructural allografts into the disc spaces.  I then placed the anterior instrumentation. I placed 2 screws in each vertebral body through the plate. I locked the screws into place. Intraoperative xray showed the graft, plate, and screws to be in good position. I irrigated the wound, achieved hemostasis, and closed the wound in layers. I approximated the platysma, and the subcuticular plane with vicryl sutures. I used Dermabond for a sterile dressing.   PLAN OF CARE: Admit for overnight observation  PATIENT DISPOSITION:  PACU - hemodynamically stable.   Delay start of Pharmacological VTE agent (>24hrs) due to surgical blood loss or risk of bleeding:  no

## 2022-03-20 NOTE — Anesthesia Procedure Notes (Signed)
Procedure Name: Intubation Date/Time: 03/20/2022 2:53 PM  Performed by: Georgia Duff, CRNAPre-anesthesia Checklist: Patient identified, Emergency Drugs available, Suction available and Patient being monitored Patient Re-evaluated:Patient Re-evaluated prior to induction Oxygen Delivery Method: Circle System Utilized Preoxygenation: Pre-oxygenation with 100% oxygen Induction Type: IV induction Ventilation: Mask ventilation without difficulty Laryngoscope Size: Glidescope and 4 Grade View: Grade I Tube type: Oral Tube size: 7.5 mm Number of attempts: 1 Airway Equipment and Method: Stylet and Oral airway Placement Confirmation: ETT inserted through vocal cords under direct vision, positive ETCO2 and breath sounds checked- equal and bilateral Secured at: 22 cm Tube secured with: Tape Dental Injury: Teeth and Oropharynx as per pre-operative assessment

## 2022-03-20 NOTE — H&P (Signed)
There were no vitals taken for this visit. I saw Billy Anderson in the hospital on 10/15/2021, and at that time I was asked to see him for spinal stenosis and difficulty with his ambulation.  That note is in the chart.  Billy Anderson and I at that time focused only on his lower back, and while Billy Anderson had a number of degenerative changes, I did not see anything that I thought required surgery and certainly nothing that required him to remain in the hospital. Today, Billy Anderson is following up since September, but now has a problem in his neck.  Billy Anderson was seen by [facility] on 01/06/2012 and when Billy Anderson came in to see them, Billy Anderson presented with his history of a seizure disorder, carpal tunnel syndrome on the left, chronic back pain, alcoholism, hyponatremia, for his seizures.  Billy Anderson is taking lamotrigine and gabapentin.  Billy Anderson also takes Aspirin, Calcium Carbonate, Diclofenac Gel, Diltiazem, Fexofenadine, Folic acid, Melatonin, Multivitamin, Naltrexone, Zyprexa, Protonix, MiraLAX, Paroxetine, Thiamin, Gabapentin, and Senokot-S.  Past medical history significant for seizure disorder, carpal tunnel, and bipolar disorder.  Billy Anderson is married.  Billy Anderson does smoke.  Says Billy Anderson stopped smoking maybe a few weeks ago.  Billy Anderson has not smoked since Billy Anderson was discharged from the hospital.  With his alcohol use, it was commented that it was 1 pint a day.  Billy Anderson does not work.     Billy Anderson has wasting of the thenar and hypothenar eminence on the left, which is worse than the right side, though, there is some on the right.  Billy Anderson did have the carpal tunnel surgery on the left side and stated that Billy Anderson never got better, and both Billy Anderson and the wife state that Billy Anderson got worse after the carpal tunnel and that this may account for the wasting in the upper extremities, but Billy Anderson was sent to me as a result of an MRI that was done showing fairly severe stenosis at C3-4 and C4-5.     On examination today, Billy Anderson vital signs are as follows:  Height 72 inches, weight 185 pounds, BMI is 25.09,  blood pressure 101/66, pulse 92, temperature is 98.5, pain is 5/10.  Billy Anderson is alert.  Billy Anderson looks much older than his stated age.  Billy Anderson is weak bilaterally in the upper extremities at probably 4/5.  Reflexes 2+.  1+ at the biceps, triceps, and brachioradialis.  Billy Anderson is brisk at the brachioradialis on the left side at 3+ and biceps is 3+ there also.  Pupils equal, round, react to light.  Full extraocular movements.  Full visual fields.  Hearing intact to voice.  Uvula elevates in the midline.  Shoulder shrug is normal.  Tongue protrudes in the midline.  Billy Anderson does have the atrophy of the thenar and hypothenar eminences on his left hand, not really as notable on the right side.     Romberg is positive.  Gait is steady.  Billy Anderson does use a walker.     MRI shows severe compression of the spinal cord at C3-4, less severe but still quite bad at C4-5.  Billy Anderson has foraminal narrowing at the other levels, but does not have the same amount of cord compression.  I have proposed that Billy Anderson undergo an ACDF at C4-5 and at C3-4 in order to decompress the spinal canal.  Billy Anderson was speaking of severe pain in his lower back, which is the original reason why I saw him in the hospital, but I had to tell him that I do not think this  is going to relieve his back pain in any way.  Risks and benefits were explained, fusion failure, hardware failure, damage to the spinal cord, paralysis, weakness in 1 or both or all extremities, problems with swallowing, possible infection and bleeding.  Billy Anderson understands, was given a detailed instruction sheet that goes over the operation in greater detail than I have dictated and wishes to proceed.

## 2022-03-21 DIAGNOSIS — F419 Anxiety disorder, unspecified: Secondary | ICD-10-CM | POA: Diagnosis not present

## 2022-03-21 DIAGNOSIS — F319 Bipolar disorder, unspecified: Secondary | ICD-10-CM | POA: Diagnosis not present

## 2022-03-21 DIAGNOSIS — G959 Disease of spinal cord, unspecified: Secondary | ICD-10-CM | POA: Diagnosis not present

## 2022-03-21 DIAGNOSIS — G40909 Epilepsy, unspecified, not intractable, without status epilepticus: Secondary | ICD-10-CM | POA: Diagnosis not present

## 2022-03-21 DIAGNOSIS — M1990 Unspecified osteoarthritis, unspecified site: Secondary | ICD-10-CM | POA: Diagnosis not present

## 2022-03-21 DIAGNOSIS — M4712 Other spondylosis with myelopathy, cervical region: Secondary | ICD-10-CM | POA: Diagnosis not present

## 2022-03-21 DIAGNOSIS — I10 Essential (primary) hypertension: Secondary | ICD-10-CM | POA: Diagnosis not present

## 2022-03-21 DIAGNOSIS — K219 Gastro-esophageal reflux disease without esophagitis: Secondary | ICD-10-CM | POA: Diagnosis not present

## 2022-03-21 DIAGNOSIS — M4802 Spinal stenosis, cervical region: Secondary | ICD-10-CM | POA: Diagnosis not present

## 2022-03-21 DIAGNOSIS — L4050 Arthropathic psoriasis, unspecified: Secondary | ICD-10-CM | POA: Diagnosis not present

## 2022-03-21 DIAGNOSIS — Z79899 Other long term (current) drug therapy: Secondary | ICD-10-CM | POA: Diagnosis not present

## 2022-03-21 MED ORDER — TIZANIDINE HCL 4 MG PO TABS: 4.0000 mg | ORAL_TABLET | Freq: Four times a day (QID) | ORAL | 0 refills | Status: DC | PRN

## 2022-03-21 MED ORDER — HYDROCODONE-ACETAMINOPHEN 7.5-325 MG PO TABS: 1.0000 | ORAL_TABLET | Freq: Four times a day (QID) | ORAL | 0 refills | Status: DC

## 2022-03-21 NOTE — Evaluation (Signed)
Occupational Therapy Evaluation and Discharge Patient Details Name: Billy Anderson MRN: [id number] DOB: [birth year] Today's Date: 03/21/2022   History of Present Illness Pt is a 57 yo male s/p anterior Cervical decompression C3/4, 4/5 and fusion C3-C6 due to MRI showing fairly severe stenosis at C3-4 and C4-5.   Clinical Impression   This 57 yo male admitted and underwent above presents to acute OT with all education completed and post op cervical surgery therapy handout provided and discussed. We will D/C from acute OT.      Recommendations for follow up therapy are one component of a multi-disciplinary discharge planning process, led by the attending physician.  Recommendations may be updated based on patient status, additional functional criteria and insurance authorization.   Follow Up Recommendations  No OT follow up     Assistance Recommended at Discharge PRN  Patient can return home with the following Assistance with cooking/housework    Functional Status Assessment  Patient has had a recent decline in their functional status and demonstrates the ability to make significant improvements in function in a reasonable and predictable amount of time. (without further skilled OT, all education completed)  Equipment Recommendations  None recommended by OT       Precautions / Restrictions Precautions Precautions: Cervical Precaution Booklet Issued: Yes (comment) Required Braces or Orthoses: Cervical Brace Cervical Brace: Soft collar;For comfort Restrictions Weight Bearing Restrictions: No      Mobility Bed Mobility Overal bed mobility: Modified Independent             General bed mobility comments: increased time    Transfers Overall transfer level: Modified independent Equipment used: Rolling walker (2 wheels) Transfers: Sit to/from Stand Sit to Stand: Modified independent (Device/Increase time)           General transfer comment: increased time       Balance Overall balance assessment: Mild deficits observed, not formally tested                                         ADL either performed or assessed with clinical judgement   ADL Overall ADL's : Modified independent                                       General ADL Comments: Educated on dressing sequencing for pull over shirt, using straws for drinking from cups, using 2 cups for mouth care (one to spit and one to rinse with straw), bed mobility, when in watch how much moving neck, do not move neck to extreme ROM up, down, or side to side     Vision Patient Visual Report: No change from baseline              Pertinent Vitals/Pain Pain Assessment Pain Assessment: 0-10 Pain Score: 2  Pain Location: incisional Pain Descriptors / Indicators:  (stiff) Pain Intervention(s): Limited activity within patient's tolerance, Monitored during session     Hand Dominance Right   Extremity/Trunk Assessment Upper Extremity Assessment Upper Extremity Assessment: RUE deficits/detail;LUE deficits/detail RUE Deficits / Details: Limited AROM at shoulder for flexion RUE Coordination: decreased gross motor LUE Deficits / Details: limited strength/coordination/sensation in hand LUE Coordination: decreased fine motor           Communication Communication Communication: No difficulties   Cognition Arousal/Alertness: Awake/alert Behavior  During Therapy: WFL for tasks assessed/performed Overall Cognitive Status: Within Functional Limits for tasks assessed                                                  Home Living Family/patient expects to be discharged to:: Private residence Living Arrangements: Spouse/significant other Available Help at Discharge: Family;Available PRN/intermittently Type of Home: House Home Access: Stairs to enter [employer] of Steps: 5 Entrance Stairs-Rails: Left;Right Home Layout: One level      Bathroom Shower/Tub: Walk-in shower;Door   Bathroom Toilet: Handicapped height     Home Equipment: Merchant navy officer (4 wheels);Shower seat   Additional Comments: lives with girlfriend who works during the day.      Prior Functioning/Environment Prior Level of Function : Independent/Modified Independent             Mobility Comments: ambulated using either rollator in house and RW when out and about ADLs Comments: Mod I  due to decreased AROM right shoulder and decreased strength/feeling in left hand        OT Problem List: Decreased strength;Decreased range of motion;Impaired balance (sitting and/or standing);Impaired UE functional use;Pain;Impaired sensation         OT Goals(Current goals can be found in the care plan section) Acute Rehab OT Goals Patient Stated Goal: to go home today, get more movement and sensation back in UEs         AM-PAC OT "6 Clicks" Daily Activity     Outcome Measure Help from another person eating meals?: None Help from another person taking care of personal grooming?: None Help from another person toileting, which includes using toliet, bedpan, or urinal?: None Help from another person bathing (including washing, rinsing, drying)?: None Help from another person to put on and taking off regular upper body clothing?: None Help from another person to put on and taking off regular lower body clothing?: None 6 Click Score: 24   End of Session Equipment Utilized During Treatment: Rolling walker (2 wheels) Nurse Communication:  (no further OT needs)  Activity Tolerance: Patient tolerated treatment well Patient left: in chair;with call bell/phone within reach  OT Visit Diagnosis: Unsteadiness on feet (R26.81);Other abnormalities of gait and mobility (R26.89);Pain Pain - part of body:  (incisional)                Time: [id number] OT Time Calculation (min): 25 min Charges:  OT General Charges $OT Visit: 1 Visit OT Evaluation $OT Eval  Moderate Complexity: 1 Mod OT Treatments $Self Care/Home Management : 8-22 mins [place] Office [phone]    Billy Anderson 03/21/2022, 9:46 AM

## 2022-03-21 NOTE — Discharge Summary (Signed)
Physician Discharge Summary  Patient ID: Billy Anderson MRN: [id number] DOB/AGE: 58-May-1967 58 y.o.  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 Post op he has weakness in the right deltoid muscle. And weakness in the right biceps.  His wound is clean, dry, without signs of infection. Speaking voice is normal. He is voiding, ambulating, and tolerating a regular diet.   Treatments: surgery: Acdf C3/4,4/5. Nuvasive ACP hardware  Discharge Exam: Blood pressure 124/74, pulse 65, temperature 97.8 F (36.6 C), temperature source Oral, resp. rate 19, height 6' (1.829 m), weight 82.6 kg, SpO2 95 %. General appearance: alert, cooperative, appears stated age, and no distress Neurologic: Motor: 4/5 deltoid on the right, 4/5 biceps, grip right side  Disposition: Discharge disposition: 01-Home or Self Care      Cervical spondylosis without myelopathy  Allergies as of 03/21/2022       Reactions   Ativan [lorazepam] Other (See Comments)   Confusion  Hallucinations    Roxicodone [oxycodone] Itching   Ultram [tramadol] Other (See Comments)   Seizures    Vistaril [hydroxyzine] Itching, Other (See Comments)   Sedation Not effective in reducing anxiety        Medication List     TAKE these medications    ALLEGRA PO Take 180 mg by mouth daily.   Aspirin Low Dose 81 MG tablet Generic drug: aspirin EC Take 1 tablet (81 mg total) by mouth daily.   CALCIUM 600+D3 PO Take 1 tablet by mouth 2 (two) times daily.   citalopram 10 MG tablet Commonly known as: CELEXA Take 1 tablet (10 mg total) by mouth at bedtime.   diclofenac Sodium 1 % Gel Commonly  known as: VOLTAREN Apply 2 g topically 4 (four) times daily as needed (pain).   diltiazem 180 MG 24 hr capsule Commonly known as: CARDIZEM CD Take 2 capsules (360 mg total) by mouth daily.   folic acid A999333 MCG tablet Commonly known as: FOLVITE Take 400 mcg by mouth daily.   folic acid 1 MG tablet Commonly known as: FOLVITE Take 1 tablet (1 mg total) by mouth daily.   gabapentin 300 MG capsule Commonly known as: NEURONTIN Take 1 capsule (300 mg total) by mouth 2 (two) times daily.   HYDROcodone-acetaminophen 7.5-325 MG tablet Commonly known as: NORCO Take 1 tablet by mouth every 6 (six) hours.   lamoTRIgine 100 MG tablet Commonly known as: LAMICTAL Take 2 tablets (200 mg total) by mouth at bedtime.   melatonin 3 MG Tabs tablet Take 1 tablet (3 mg total) by mouth at bedtime as needed (for sleep). What changed: when to take this   multivitamin tablet Take 1 tablet by mouth daily.   naltrexone 50 MG tablet Commonly known as: DEPADE Take 1 tablet (50 mg total) by mouth daily.   OLANZapine 20 MG tablet Commonly known as: ZYPREXA Take 1 tablet (20 mg total) by mouth at bedtime.   pantoprazole 40 MG tablet Commonly known as: PROTONIX Take 1 tablet (40 mg total) by mouth daily.   polyethylene glycol powder 17 GM/SCOOP powder Commonly known as: GLYCOLAX/MIRALAX Take 17 g by mouth 2 (two) times daily as needed for mild constipation.   pyridOXINE 100 MG tablet Commonly  known as: VITAMIN B6 Take 1 tablet (100 mg total) by mouth daily.   sodium chloride 1 g tablet Take 1 tablet (1 g total) by mouth 3 (three) times daily with meals.   thiamine 100 MG tablet Commonly known as: VITAMIN B1 Take 1 tablet (100 mg total) by mouth daily.   tiZANidine 4 MG tablet Commonly known as: ZANAFLEX TAKE ONE TABLET BY MOUTH EVERY 8 HOURS AS NEEDED FOR MUSCLE SPASMS What changed: Another medication with the same name was added. Make sure you understand how and when to take each.    tiZANidine 4 MG tablet Commonly known as: ZANAFLEX Take 1 tablet (4 mg total) by mouth every 6 (six) hours as needed for muscle spasms. What changed: You were already taking a medication with the same name, and this prescription was added. Make sure you understand how and when to take each.        Follow-up Information     Ashok Pall, MD Follow up.   Specialty: Neurosurgery Why: keep your scheduled Appointment Contact information: [address] [place] [address] [phone]                 Signed: Ashok Pall 03/21/2022, 8:15 AM

## 2022-03-21 NOTE — Progress Notes (Signed)
Patient is discharged from room 3C05 at this time. Alert and in stable condition. IV site d/c'd and instructions read to patient with understanding verbalized and all questions answered. Left unit via wheelchair with all belongings at side.

## 2022-03-21 NOTE — Care Management (Signed)
Patient with order to DC to home today.  Unit staff to provide DME needed for home.   No HH needs identified  Patient will have family/ friends provide transportation home. No other TOC needs identified for DC

## 2022-03-21 NOTE — Discharge Instructions (Signed)

## 2022-03-21 NOTE — Anesthesia Postprocedure Evaluation (Signed)
Anesthesia Post Note  Patient: Billy Anderson  Procedure(s) Performed: Cervical Three-Four, Cervical Four-Five Anterior Cervical Discectomy Fusion     Patient location during evaluation: PACU Anesthesia Type: General Level of consciousness: awake and alert Pain management: pain level controlled Vital Signs Assessment: post-procedure vital signs reviewed and stable Respiratory status: spontaneous breathing, nonlabored ventilation, respiratory function stable and patient connected to nasal cannula oxygen Cardiovascular status: blood pressure returned to baseline and stable Postop Assessment: no apparent nausea or vomiting Anesthetic complications: no   No notable events documented.  Last Vitals:  Vitals:   03/21/22 0320 03/21/22 0733  BP: 127/75 124/74  Pulse: 64 65  Resp: 18 19  Temp: 36.9 C 36.6 C  SpO2: 94% 95%    Last Pain:  Vitals:   03/21/22 0733  TempSrc: Oral  PainSc:                  March Rummage Laiylah Roettger

## 2022-03-21 NOTE — Evaluation (Signed)
Physical Therapy Evaluation Patient Details Name: Billy Anderson MRN: [id number] DOB: 1965-02-25 Today's Date: 03/21/2022  History of Present Illness  Pt is a 57 yo male s/p anterior Cervical decompression C3/4, 4/5 and fusion C3-C6 due to MRI showing fairly severe stenosis at C3-4 and C4-5.  Clinical Impression  Pt presents to PT with deficits in activity tolerance, strength, power, sensation (tingling/numbness in LUE), and functional mobility. Pt is currently able to transfer and ambulated with support of RW, PT provides cues to improve posture and adjusts pt's walker to increase trunk extension. PT provides cues during stair training to limit twisting when utilizing railing for support. Pt will benefit from continued frequent mobilization in an effort to restore independence in function.       Recommendations for follow up therapy are one component of a multi-disciplinary discharge planning process, led by the attending physician.  Recommendations may be updated based on patient status, additional functional criteria and insurance authorization.  Follow Up Recommendations No PT follow up      Assistance Recommended at Discharge PRN  Patient can return home with the following  A little help with bathing/dressing/bathroom;Assistance with cooking/housework;Assist for transportation    Equipment Recommendations None recommended by PT  Recommendations for Other Services       Functional Status Assessment Patient has had a recent decline in their functional status and demonstrates the ability to make significant improvements in function in a reasonable and predictable amount of time.     Precautions / Restrictions Precautions Precautions: Cervical Precaution Booklet Issued: Yes (comment) Required Braces or Orthoses:  (no brace needed per orders at time of eval, soft collar orders placed after PT performed evaluation) Cervical Brace: Soft collar;For comfort Restrictions Weight Bearing  Restrictions: No      Mobility  Bed Mobility               General bed mobility comments: received and left in recline, denies concerns about bed mobility    Transfers Overall transfer level: Modified independent Equipment used: Rolling walker (2 wheels) Transfers: Sit to/from Stand Sit to Stand: Modified independent (Device/Increase time)                Ambulation/Gait Ambulation/Gait assistance: Supervision Gait Distance (Feet): 200 Feet Assistive device: Rolling walker (2 wheels) Gait Pattern/deviations: Step-through pattern Gait velocity: reduced Gait velocity interpretation: <1.8 ft/sec, indicate of risk for recurrent falls   General Gait Details: slowed step-through gait, PT encourages increased trunk extension, adjusts walker height after walker to reduce upper thoracic flexion  Stairs Stairs: Yes Stairs assistance: Supervision Stair Management: One rail Left, Step to pattern, Forwards Number of Stairs: 5 General stair comments: BUE on railing  Wheelchair Mobility    Modified Rankin (Stroke Patients Only)       Balance Overall balance assessment: Needs assistance Sitting-balance support: No upper extremity supported, Feet supported Sitting balance-Leahy Scale: Good     Standing balance support: Bilateral upper extremity supported, Reliant on assistive device for balance Standing balance-Leahy Scale: Poor                               Pertinent Vitals/Pain Pain Assessment Pain Assessment: 0-10 Pain Score: 5  Pain Location: incisional Pain Descriptors / Indicators:  (stiff) Pain Intervention(s): Limited activity within patient's tolerance    Home Living Family/patient expects to be discharged to:: Private residence Living Arrangements: Spouse/significant other Available Help at Discharge: Family;Available PRN/intermittently Type of Home: House  Home Access: Stairs to enter Entrance Stairs-Rails: Engineer, site of Steps: 5   Home Layout: One level Home Equipment: Merchant navy officer (4 wheels);Shower seat;Rolling Environmental consultant (2 wheels) Additional Comments: lives with girlfriend who works during the day.    Prior Function Prior Level of Function : Needs assist             Mobility Comments: ambulated using either rollator in house and RW when out and about ADLs Comments: pt reports intermittent assistance for cooking and dressing     Hand Dominance   Dominant Hand: Right    Extremity/Trunk Assessment   Upper Extremity Assessment Upper Extremity Assessment: Defer to OT evaluation RUE Deficits / Details: Limited AROM at shoulder for flexion RUE Coordination: decreased gross motor LUE Deficits / Details: limited strength/coordination/sensation in hand LUE Coordination: decreased fine motor    Lower Extremity Assessment Lower Extremity Assessment: Generalized weakness    Cervical / Trunk Assessment Cervical / Trunk Assessment: Neck Surgery  Communication   Communication: No difficulties  Cognition Arousal/Alertness: Awake/alert Behavior During Therapy: WFL for tasks assessed/performed Overall Cognitive Status: Within Functional Limits for tasks assessed                                          General Comments General comments (skin integrity, edema, etc.): VSS on RA    Exercises     Assessment/Plan    PT Assessment Patient needs continued PT services  PT Problem List Decreased strength;Decreased activity tolerance;Decreased mobility;Decreased balance;Decreased knowledge of use of DME;Decreased knowledge of precautions;Pain       PT Treatment Interventions DME instruction;Gait training;Stair training;Functional mobility training;Therapeutic activities;Therapeutic exercise;Balance training;Neuromuscular re-education;Patient/family education    PT Goals (Current goals can be found in the Care Plan section)  Acute Rehab PT Goals Patient Stated  Goal: to go home PT Goal Formulation: With patient Time For Goal Achievement: 03/26/22 Potential to Achieve Goals: Good    Frequency Min 5X/week     Co-evaluation               AM-PAC PT "6 Clicks" Mobility  Outcome Measure Help needed turning from your back to your side while in a flat bed without using bedrails?: A Little Help needed moving from lying on your back to sitting on the side of a flat bed without using bedrails?: A Little Help needed moving to and from a bed to a chair (including a wheelchair)?: None Help needed standing up from a chair using your arms (e.g., wheelchair or bedside chair)?: None Help needed to walk in hospital room?: A Little Help needed climbing 3-5 steps with a railing? : A Little 6 Click Score: 20    End of Session Equipment Utilized During Treatment: Gait belt Activity Tolerance: Patient tolerated treatment well Patient left: in chair;with call bell/phone within reach Nurse Communication: Mobility status PT Visit Diagnosis: Other abnormalities of gait and mobility (R26.89);Muscle weakness (generalized) (M62.81);Pain Pain - part of body:  (neck)    Time: [id number] PT Time Calculation (min) (ACUTE ONLY): 14 min   Charges:   PT Evaluation $PT Eval Low Complexity: 1 Low          Zenaida Niece, PT, DPT Acute Rehabilitation Office [phone]   Zenaida Niece 03/21/2022, 10:00 AM

## 2022-03-21 NOTE — Progress Notes (Signed)
Orthopedic Tech Progress Note Patient Details:  Billy Anderson [birth year] [id number]   Soft cervical collar was applied and adjusted to pt without incident.  Ortho Devices Type of Ortho Device: Soft collar Ortho Device/Splint Location: pt's neck/c-spine Ortho Device/Splint Interventions: Ordered, Application, Adjustment   Post Interventions Patient Tolerated: Well Instructions Provided: Care of device, Adjustment of device  Elverta Dimiceli Jeri Modena 03/21/2022, 9:37 AM

## 2022-03-24 ENCOUNTER — Encounter (HOSPITAL_COMMUNITY): Payer: Self-pay | Admitting: Neurosurgery

## 2022-04-06 DIAGNOSIS — L400 Psoriasis vulgaris: Secondary | ICD-10-CM | POA: Diagnosis not present

## 2022-04-06 DIAGNOSIS — D225 Melanocytic nevi of trunk: Secondary | ICD-10-CM | POA: Diagnosis not present

## 2022-04-06 DIAGNOSIS — L728 Other follicular cysts of the skin and subcutaneous tissue: Secondary | ICD-10-CM | POA: Diagnosis not present

## 2022-04-13 DIAGNOSIS — M47812 Spondylosis without myelopathy or radiculopathy, cervical region: Secondary | ICD-10-CM | POA: Diagnosis not present

## 2022-04-15 ENCOUNTER — Other Ambulatory Visit: Payer: Self-pay | Admitting: Nurse Practitioner

## 2022-05-04 ENCOUNTER — Other Ambulatory Visit: Payer: Self-pay | Admitting: Nurse Practitioner

## 2022-05-06 ENCOUNTER — Telehealth (HOSPITAL_BASED_OUTPATIENT_CLINIC_OR_DEPARTMENT_OTHER): Payer: Medicare HMO | Admitting: Psychiatry

## 2022-05-06 ENCOUNTER — Encounter (HOSPITAL_COMMUNITY): Payer: Self-pay | Admitting: Psychiatry

## 2022-05-06 DIAGNOSIS — F319 Bipolar disorder, unspecified: Secondary | ICD-10-CM | POA: Diagnosis not present

## 2022-05-06 DIAGNOSIS — F1021 Alcohol dependence, in remission: Secondary | ICD-10-CM | POA: Diagnosis not present

## 2022-05-06 MED ORDER — OLANZAPINE 20 MG PO TABS: 20.0000 mg | ORAL_TABLET | Freq: Every day | ORAL | 2 refills | Status: DC

## 2022-05-06 MED ORDER — NALTREXONE HCL 50 MG PO TABS: 50.0000 mg | ORAL_TABLET | Freq: Every day | ORAL | 2 refills | Status: DC

## 2022-05-06 NOTE — Progress Notes (Signed)
[facility] MD Virtual Progress Note   Patient Location: Home Provider Location: Home Office  I connect with patient by video and verified that I am speaking with correct person by using two identifiers. I discussed the limitations of evaluation and management by telemedicine and the availability of in person appointments. I also discussed with the patient that there may be a patient responsible charge related to this service. The patient expressed understanding and agreed to proceed.  Billy Anderson [id number] 57 y.o.  05/06/2022 3:10 PM  History of Present Illness:  Patient is evaluated by video session.  He is taking his medication as prescribed.  He reported mood is stable and denies any recent impulsive behavior, mania, psychosis.  He claimed to be sober from drinking and taking the naltrexone that helps the craving.  He is seeing neurology and recently had spinal fusion for cervical stenosis.  He was given pain medicine but now he is no longer taking it.  Patient is on disability.  He sleeps sometimes more than 10 hours but also endorses he wakes up every hour to go to the bathroom.  He denies any hallucination, paranoia or any suicidal thoughts.  He is taking gabapentin and Lamictal from neurology.  He was also given Celexa by primary care.  He is tolerating olanzapine and denies any tremors, shakes or any EPS.  He is cleared from driving but has not started driving yet.  He feels the current medicine keeping his mood stable and like to continue it.  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  Started drinking at age 37. Multiple rehab for alcohol addiction. H/O low sodium and seizure. Tried lithium, Seroquel, Vraylar with poor outcome.  Given Celexa by PCP but stopped.  No history of suicidal attempt.  History of mania with excessive spending and grandiosity.  No history of abuse.     Outpatient Encounter Medications as of  05/06/2022  Medication Sig   aspirin EC 81 MG tablet Take 1 tablet (81 mg total) by mouth daily.   Calcium Carb-Cholecalciferol (CALCIUM 600+D3 PO) Take 1 tablet by mouth 2 (two) times daily.   diclofenac Sodium (VOLTAREN) 1 % GEL Apply 2 g topically 4 (four) times daily as needed (pain).   diltiazem (CARDIZEM CD) 180 MG 24 hr capsule TAKE 2 CAPSULES BY MOUTH DAILY   Fexofenadine HCl (ALLEGRA PO) Take 180 mg by mouth daily.   folic acid (FOLVITE) 1 MG tablet Take 1 tablet (1 mg total) by mouth daily. (Patient not taking: Reported on 03/12/2022)   folic acid (FOLVITE) 400 MCG tablet Take 400 mcg by mouth daily.   gabapentin (NEURONTIN) 300 MG capsule Take 1 capsule (300 mg total) by mouth 2 (two) times daily.   HYDROcodone-acetaminophen (NORCO) 7.5-325 MG tablet Take 1 tablet by mouth every 6 (six) hours.   lamoTRIgine (LAMICTAL) 100 MG tablet Take 2 tablets (200 mg total) by mouth at bedtime.   melatonin 3 MG TABS tablet Take 1 tablet (3 mg total) by mouth at bedtime as needed (for sleep). (Patient taking differently: Take 3 mg by mouth at bedtime.)   Multiple Vitamin (MULTIVITAMIN) tablet Take 1 tablet by mouth daily.   naltrexone (DEPADE) 50 MG tablet Take 1 tablet (50 mg total) by mouth daily.   OLANZapine (ZYPREXA) 20 MG tablet Take 1 tablet (20 mg total) by mouth at bedtime.   pantoprazole (PROTONIX) 40 MG tablet TAKE 1 TABLET BY MOUTH DAILY   polyethylene glycol powder (GLYCOLAX/MIRALAX)  17 GM/SCOOP powder Take 17 g by mouth 2 (two) times daily as needed for mild constipation.   pyridOXINE (VITAMIN B6) 100 MG tablet Take 1 tablet (100 mg total) by mouth daily.   sodium chloride 1 g tablet Take 1 tablet (1 g total) by mouth 3 (three) times daily with meals.   thiamine (VITAMIN B1) 100 MG tablet Take 1 tablet (100 mg total) by mouth daily.   tiZANidine (ZANAFLEX) 4 MG tablet TAKE ONE TABLET BY MOUTH EVERY 8 HOURS AS NEEDED FOR MUSCLE SPASMS   tiZANidine (ZANAFLEX) 4 MG tablet Take 1 tablet (4  mg total) by mouth every 6 (six) hours as needed for muscle spasms.   [DISCONTINUED] citalopram (CELEXA) 10 MG tablet Take 1 tablet (10 mg total) by mouth at bedtime.   [DISCONTINUED] naltrexone (DEPADE) 50 MG tablet Take 1 tablet (50 mg total) by mouth daily.   [DISCONTINUED] OLANZapine (ZYPREXA) 20 MG tablet Take 1 tablet (20 mg total) by mouth at bedtime.   No facility-administered encounter medications on file as of 05/06/2022.    Recent Results (from the past 2160 hour(s))  Type and screen     Status: None   Collection Time: 03/17/22  3:18 PM  Result Value Ref Range   ABO/RH(D) O POS    Antibody Screen NEG    Sample Expiration 03/31/2022,2359    Extend sample reason      NO TRANSFUSIONS OR PREGNANCY IN THE PAST 3 MONTHS Performed at [facility], [address] [place], [place] [address]   Basic metabolic panel per protocol     Status: Abnormal   Collection Time: 03/17/22  3:29 PM  Result Value Ref Range   Sodium 132 (L) 135 - 145 mmol/L   Potassium 4.4 3.5 - 5.1 mmol/L   Chloride 98 98 - 111 mmol/L   CO2 25 22 - 32 mmol/L   Glucose, Bld 90 70 - 99 mg/dL    Comment: Glucose reference range applies only to samples taken after fasting for at least 8 hours.   BUN 8 6 - 20 mg/dL   Creatinine, Ser 2.95 0.61 - 1.24 mg/dL   Calcium 9.0 8.9 - 62.1 mg/dL   GFR, Estimated >30 >86 mL/min    Comment: (NOTE) Calculated using the CKD-EPI Creatinine Equation (2021)    Anion gap 9 5 - 15    Comment: Performed at [facility], [address] [place], [place] [address]  CBC per protocol     Status: None   Collection Time: 03/17/22  3:29 PM  Result Value Ref Range   WBC 9.1 4.0 - 10.5 K/uL   RBC 4.31 4.22 - 5.81 MIL/uL   Hemoglobin 14.1 13.0 - 17.0 g/dL   HCT 96.2 95.2 - 84.1 %   MCV 92.6 80.0 - 100.0 fL   MCH 32.7 26.0 - 34.0 pg   MCHC 35.3 30.0 - 36.0 g/dL   RDW 32.4 40.1 - 02.7 %   Platelets 271 150 - 400 K/uL   nRBC 0.0 0.0 - 0.2 %    Comment: Performed at [facility], [address] [place], [place] [address]  Surgical pcr screen     Status: Abnormal   Collection Time: 03/17/22  4:11 PM   Specimen: Nasal Mucosa; Nasal Swab  Result Value Ref Range   MRSA, PCR NEGATIVE NEGATIVE   Staphylococcus aureus POSITIVE (A) NEGATIVE    Comment: (NOTE) The Xpert SA Assay (FDA approved for NASAL specimens in  patients 77 years of age and older), is one component of a comprehensive surveillance program. It is not intended to diagnose infection nor to guide or monitor treatment. Performed at [facility], [address] [place], [place] [address]   ABO/Rh     Status: None   Collection Time: 03/20/22 11:52 AM  Result Value Ref Range   ABO/RH(D)      O POS Performed at [facility], [address] [place], [place] [address]      Psychiatric Specialty Exam: Physical Exam  Review of Systems  Weight 182 lb (82.6 kg).Body mass index is 24.68 kg/m.  General Appearance: Casual  Eye Contact:  Good  Speech:  Clear and Coherent and Normal Rate  Volume:  Normal  Mood:  Euthymic  Affect:  Congruent  Thought Process:  Goal Directed  Orientation:  Full (Time, Place, and Person)  Thought Content:  Logical  Suicidal Thoughts:  No  Homicidal Thoughts:  No  Memory:  Immediate;   Good Recent;   Good Remote;   Good  Judgement:  Good  Insight:  Present  Psychomotor Activity:  Normal  Concentration:  Concentration: Good and Attention Span: Good  Recall:  Good  Fund of Knowledge:  Good  Language:  Good  Akathisia:  No  Handed:  Right  AIMS (if indicated):     Assets:  Communication Skills Desire for Improvement Housing Resilience Social Support  ADL's:  Intact  Cognition:  WNL  Sleep: Some time more than 10 hours has wake up in the night to go to the bathroom.     Assessment/Plan: Bipolar I disorder - Plan: OLANZapine (ZYPREXA) 20 MG tablet  Alcohol use disorder, moderate, in early remission - Plan: naltrexone (DEPADE) 50 MG  tablet  Patient is stable on current medication.  He remains sober from drinking and denies any impulsive behavior.  Continue olanzapine 20 mg daily and naltrexone 50 mg daily.  He is also getting Neurontin, Lamictal from neurology.  Recently had spinal fusion for cervical stenosis and recovering better.  Discussed medication side effects and benefits.  He is not interested in therapy.  Recommend to call us back if is any question or any concern.  Follow-up in 3 months.   Follow Up Instructions:     I discussed the assessment and treatment plan with the patient. The patient was provided an opportunity to ask questions and all were answered. The patient agreed with the plan and demonstrated an understanding of the instructions.   The patient was advised to call back or seek an in-person evaluation if the symptoms worsen or if the condition fails to improve as anticipated.    Collaboration of Care: Other provider involved in patient's care AEB notes are available in epic to review.  Patient/Guardian was advised Release of Information must be obtained prior to any record release in order to collaborate their care with an outside provider. Patient/Guardian was advised if they have not already done so to contact the registration department to sign all necessary forms in order for [place] to release information regarding their care.   Consent: Patient/Guardian gives verbal consent for treatment and assignment of benefits for services provided during this visit. Patient/Guardian expressed understanding and agreed to proceed.     I provided 18 minutes of non face to face time during this encounter.  Note: This document was prepared by [employer] voice dictation technology and any errors that results from this process are unintentional.  Cleotis Nipper, MD 05/06/2022

## 2022-05-12 ENCOUNTER — Encounter: Payer: Self-pay | Admitting: Nurse Practitioner

## 2022-05-12 ENCOUNTER — Telehealth (INDEPENDENT_AMBULATORY_CARE_PROVIDER_SITE_OTHER): Payer: Medicare HMO | Admitting: Nurse Practitioner

## 2022-05-12 DIAGNOSIS — R569 Unspecified convulsions: Secondary | ICD-10-CM | POA: Diagnosis not present

## 2022-05-12 DIAGNOSIS — I480 Paroxysmal atrial fibrillation: Secondary | ICD-10-CM

## 2022-05-12 DIAGNOSIS — F17210 Nicotine dependence, cigarettes, uncomplicated: Secondary | ICD-10-CM

## 2022-05-12 DIAGNOSIS — F17200 Nicotine dependence, unspecified, uncomplicated: Secondary | ICD-10-CM

## 2022-05-12 DIAGNOSIS — M4802 Spinal stenosis, cervical region: Secondary | ICD-10-CM | POA: Diagnosis not present

## 2022-05-12 DIAGNOSIS — G992 Myelopathy in diseases classified elsewhere: Secondary | ICD-10-CM | POA: Diagnosis not present

## 2022-05-12 DIAGNOSIS — N3281 Overactive bladder: Secondary | ICD-10-CM

## 2022-05-12 MED ORDER — OXYBUTYNIN CHLORIDE ER 10 MG PO TB24: 10.0000 mg | ORAL_TABLET | Freq: Every day | ORAL | 0 refills | Status: DC

## 2022-05-12 NOTE — Assessment & Plan Note (Signed)
He has been doing well since after having surgery Starting physical therapy soon Takes Norco as needed, tizanidine as needed

## 2022-05-12 NOTE — Assessment & Plan Note (Signed)
Continues to smoke 1 pack of cigarettes a day To avoid smoking discussed Patient counseled on smoking cessation

## 2022-05-12 NOTE — Progress Notes (Signed)
Virtual Visit via Telephone Note  I connected with Billy Anderson 89 @ on 05/12/22 at 0420pm by telephone and verified that I am speaking with the correct person using two identifiers.  I spent 11 minutes on this telehealth encounter  Location: Patient: home Provider: office   I discussed the limitations, risks, security and privacy concerns of performing an evaluation and management service by telephone and the availability of in person appointments. I also discussed with the patient that there may be a patient responsible charge related to this service. The patient expressed understanding and agreed to proceed.   History of Present Illness: Billy Anderson  has a past medical history of Arthritis, Bipolar disorder, Dysrhythmia, GERD (gastroesophageal reflux disease), Hypertension, Pneumonia, and Seizures.  Patient presents for follow-up for his chronic medical conditions  Patient stated that he has been feeling much better since he had his neck surgery, he is scheduled to start physical therapy soon.  He is able to ambulate using a walker.  Stated that he has had 1 postop visit since he had his surgery.  Patient complains of urinary frequency, rare urinary incontinence for several months.  He has been getting up every hour at night to urinate.  He denies straining, dysuria, hesitancy, fever, chills.   Continues to smoke 1 pack of cigarettes daily.  He denies cough, shortness of breath, wheezing.  He continues to abstain from drinking alcohol.   Observations/Objective: Patient alert and oriented x 4.  No sign of distress noted  Assessment and Plan: Tobacco use disorder Continues to smoke 1 pack of cigarettes a day To avoid smoking discussed Patient counseled on smoking cessation  Overactive bladder Start Ditropan 10 mg daily Patient was encouraged to avoid drinking water late in the night. 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 The patient was provided an opportunity to ask questions and all were answered. The patient agreed with the plan and demonstrated an understanding of the instructions.   The patient was advised to call back or seek an in-person evaluation if the symptoms worsen or if the condition fails to improve as anticipated.Virtual Visit via Telephone

## 2022-05-12 NOTE — Assessment & Plan Note (Signed)
Continue Cardizem 360 mg daily Aspirin 81 mg daily No complaints of palpitation, dizziness, shortness of breath

## 2022-05-12 NOTE — Assessment & Plan Note (Signed)
Start Ditropan 10 mg daily Patient was encouraged to avoid drinking water late in the night. He declined urology referral today Last blood sugar was within normal range this does not suggest type 2 diabetes

## 2022-05-12 NOTE — Patient Instructions (Signed)
1. Overactive bladder  - oxybutynin (DITROPAN XL) 10 MG 24 hr tablet; Take 1 tablet (10 mg total) by mouth at bedtime.  Dispense: 90 tablet; Refill: 0     It is important that you exercise regularly at least 30 minutes 5 times a week as tolerated  Think about what you will eat, plan ahead. Choose " clean, green, fresh or frozen" over canned, processed or packaged foods which are more sugary, salty and fatty. 70 to 75% of food eaten should be vegetables and fruit. Three meals at set times with snacks allowed between meals, but they must be fruit or vegetables. Aim to eat over a 12 hour period , example 7 am to 7 pm, and STOP after  your last meal of the day. Drink water,generally about 64 ounces per day, no other drink is as healthy. Fruit juice is best enjoyed in a healthy way, by EATING the fruit.  Thanks for choosing [facility] we consider it a privelige to serve you.

## 2022-05-12 NOTE — Assessment & Plan Note (Signed)
  Continue lamotrigine 200 mg at night, gabapentin 300 mg twice daily, sodium chloride 1 g 3 times daily with meals Patient to maintain close follow-up with neurologist at [facility]

## 2022-05-20 ENCOUNTER — Telehealth: Payer: Self-pay | Admitting: Neurology

## 2022-05-20 NOTE — Telephone Encounter (Signed)
Sent mychart msg informing pt of appt change due to provider schedule change 

## 2022-05-21 ENCOUNTER — Other Ambulatory Visit: Payer: Self-pay

## 2022-05-21 MED ORDER — PANTOPRAZOLE SODIUM 40 MG PO TBEC: 40.0000 mg | DELAYED_RELEASE_TABLET | Freq: Every day | ORAL | 0 refills | Status: DC

## 2022-06-07 DIAGNOSIS — I480 Paroxysmal atrial fibrillation: Principal | ICD-10-CM

## 2022-06-07 MED ORDER — DILTIAZEM ER 360 MG CAPSULE,24 HR,EXTENDED RELEASE
ORAL_CAPSULE | ORAL | 1 refills | 90 days
Start: 2022-06-07 — End: ?

## 2022-06-08 MED ORDER — DILTIAZEM ER 360 MG CAPSULE,24 HR,EXTENDED RELEASE
ORAL_CAPSULE | ORAL | 1 refills | 90 days
Start: 2022-06-08 — End: ?

## 2022-06-15 ENCOUNTER — Other Ambulatory Visit: Payer: Self-pay | Admitting: Nurse Practitioner

## 2022-06-22 DIAGNOSIS — M542 Cervicalgia: Secondary | ICD-10-CM | POA: Diagnosis not present

## 2022-06-24 ENCOUNTER — Encounter: Payer: Self-pay | Admitting: Neurology

## 2022-06-24 ENCOUNTER — Ambulatory Visit: Payer: Medicare Other | Admitting: Neurology

## 2022-06-27 ENCOUNTER — Other Ambulatory Visit: Payer: Self-pay | Admitting: Nurse Practitioner

## 2022-07-07 ENCOUNTER — Ambulatory Visit: Payer: Medicare Other | Admitting: Neurology

## 2022-07-19 ENCOUNTER — Other Ambulatory Visit: Payer: Self-pay | Admitting: Nurse Practitioner

## 2022-07-20 ENCOUNTER — Other Ambulatory Visit: Payer: Self-pay | Admitting: Nurse Practitioner

## 2022-07-20 MED ORDER — DILTIAZEM HCL ER COATED BEADS 180 MG PO CP24: 360.0000 mg | ORAL_CAPSULE | Freq: Every day | ORAL | 4 refills | Status: DC

## 2022-07-20 NOTE — Telephone Encounter (Signed)
Please advise Kh 

## 2022-07-27 ENCOUNTER — Other Ambulatory Visit: Payer: Self-pay | Admitting: Nurse Practitioner

## 2022-07-28 NOTE — Telephone Encounter (Signed)
Please advise in Fola absence. KH   

## 2022-08-01 ENCOUNTER — Other Ambulatory Visit: Payer: Self-pay | Admitting: Nurse Practitioner

## 2022-08-03 ENCOUNTER — Other Ambulatory Visit: Payer: Self-pay | Admitting: Nurse Practitioner

## 2022-08-03 MED ORDER — PANTOPRAZOLE SODIUM 40 MG PO TBEC: 40.0000 mg | DELAYED_RELEASE_TABLET | Freq: Every day | ORAL | 3 refills | Status: DC

## 2022-08-04 ENCOUNTER — Telehealth (HOSPITAL_BASED_OUTPATIENT_CLINIC_OR_DEPARTMENT_OTHER): Payer: Medicare HMO | Admitting: Psychiatry

## 2022-08-04 ENCOUNTER — Encounter (HOSPITAL_COMMUNITY): Payer: Self-pay | Admitting: Psychiatry

## 2022-08-04 VITALS — Wt 182.0 lb

## 2022-08-04 DIAGNOSIS — F319 Bipolar disorder, unspecified: Secondary | ICD-10-CM | POA: Diagnosis not present

## 2022-08-04 DIAGNOSIS — F1021 Alcohol dependence, in remission: Secondary | ICD-10-CM

## 2022-08-04 DIAGNOSIS — F1091 Alcohol use, unspecified, in remission: Secondary | ICD-10-CM

## 2022-08-04 MED ORDER — OLANZAPINE 20 MG PO TABS: 20.0000 mg | ORAL_TABLET | Freq: Every day | ORAL | 2 refills | Status: DC

## 2022-08-04 MED ORDER — NALTREXONE HCL 50 MG PO TABS: 50.0000 mg | ORAL_TABLET | Freq: Every day | ORAL | 2 refills | Status: DC

## 2022-08-04 NOTE — Progress Notes (Signed)
[facility] MD Virtual Progress Note   Patient Location: Home Provider Location: Home Office  I connect with patient by video and verified that I am speaking with correct person by using two identifiers. I discussed the limitations of evaluation and management by telemedicine and the availability of in person appointments. I also discussed with the patient that there may be a patient responsible charge related to this service. The patient expressed understanding and agreed to proceed.  Billy Anderson [id number] 57 y.o.  08/04/2022 2:45 PM  History of Present Illness:  Patient is evaluated by video session.  He is taking his medication as prescribed.  He remains sober from drinking and pleased that it is more than 8 months he has not had any slips.  Recently saw a neurologist for follow-up but no new medication.  He remained seizure-free.  He has numbness and tingling in his left hand but overall he feels he is symptoms are stable.  He denies any mania, psychosis, hallucination.  Denies any agitation or any paranoia.  He is taking Celexa prescribed by PCP.  He also taking olanzapine and Given by [place].  He Takes the Lamictal from Neurology for Seizures.  He Has No Rash, Itching, Tremors or Shakes.  He Is Not Interested in Therapy.  His Appetite Is Okay and His Weight Is Stable.  Like to Continue His Current Medication.  His Celexa Is Recently Given by and Has 1 More refill.  Patient is on disability.  He lives with his wife.  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  Started drinking at age 42. Multiple rehab for alcohol addiction. H/O low sodium and seizure. Tried lithium, Seroquel, Vraylar with poor outcome.  Given Celexa by PCP but stopped.  No history of suicidal attempt.  History of mania with excessive spending and grandiosity.  No history of abuse.     Outpatient Encounter Medications as of 08/04/2022  Medication Sig   aspirin EC  81 MG tablet Take 1 tablet (81 mg total) by mouth daily.   Calcium Carb-Cholecalciferol (CALCIUM 600+D3 PO) Take 1 tablet by mouth 2 (two) times daily.   citalopram (CELEXA) 10 MG tablet TAKE 1 TABLET BY MOUTH AT BEDTIME   diclofenac Sodium (VOLTAREN) 1 % GEL Apply 2 g topically 4 (four) times daily as needed (pain). (Patient not taking: Reported on 05/12/2022)   diltiazem (CARDIZEM CD) 180 MG 24 hr capsule Take 2 capsules (360 mg total) by mouth daily.   Fexofenadine HCl (ALLEGRA PO) Take 180 mg by mouth daily.   folic acid (FOLVITE) 1 MG tablet Take 1 tablet (1 mg total) by mouth daily. (Patient not taking: Reported on 03/12/2022)   folic acid (FOLVITE) 400 MCG tablet Take 400 mcg by mouth daily. (Patient not taking: Reported on 05/12/2022)   gabapentin (NEURONTIN) 300 MG capsule Take 1 capsule (300 mg total) by mouth 2 (two) times daily.   HYDROcodone-acetaminophen (NORCO) 7.5-325 MG tablet Take 1 tablet by mouth every 6 (six) hours.   lamoTRIgine (LAMICTAL) 100 MG tablet Take 2 tablets (200 mg total) by mouth at bedtime.   melatonin 3 MG TABS tablet Take 1 tablet (3 mg total) by mouth at bedtime as needed (for sleep). (Patient not taking: Reported on 05/12/2022)   Multiple Vitamin (MULTIVITAMIN) tablet Take 1 tablet by mouth daily.   naltrexone (DEPADE) 50 MG tablet Take 1 tablet (50 mg total) by mouth daily.   OLANZapine (ZYPREXA) 20 MG tablet Take 1 tablet (20  mg total) by mouth at bedtime.   oxybutynin (DITROPAN XL) 10 MG 24 hr tablet Take 1 tablet (10 mg total) by mouth at bedtime.   pantoprazole (PROTONIX) 40 MG tablet Take 1 tablet (40 mg total) by mouth daily.   polyethylene glycol powder (GLYCOLAX/MIRALAX) 17 GM/SCOOP powder Take 17 g by mouth 2 (two) times daily as needed for mild constipation. (Patient not taking: Reported on 05/12/2022)   pyridOXINE (VITAMIN B6) 100 MG tablet Take 1 tablet (100 mg total) by mouth daily.   sodium chloride 1 g tablet Take 1 tablet (1 g total) by mouth 3  (three) times daily with meals.   thiamine (VITAMIN B1) 100 MG tablet Take 1 tablet (100 mg total) by mouth daily.   tiZANidine (ZANAFLEX) 4 MG tablet Take 1 tablet (4 mg total) by mouth every 6 (six) hours as needed for muscle spasms.   No facility-administered encounter medications on file as of 08/04/2022.    No results found for this or any previous visit (from the past 2160 hour(s)).   Psychiatric Specialty Exam: Physical Exam  Review of Systems  Neurological:  Positive for numbness.    Weight 182 lb (82.6 kg).There is no height or weight on file to calculate BMI.  General Appearance: Casual  Eye Contact:  Fair  Speech:  Slow  Volume:  Decreased  Mood:  Euthymic  Affect:  Congruent  Thought Process:  Goal Directed  Orientation:  Full (Time, Place, and Person)  Thought Content:  Logical  Suicidal Thoughts:  No  Homicidal Thoughts:  No  Memory:  Immediate;   Good Recent;   Good Remote;   Fair  Judgement:  Intact  Insight:  Present  Psychomotor Activity:  Decreased  Concentration:  Concentration: Fair and Attention Span: Fair  Recall:  [employer] of Knowledge:  Good  Language:  Good  Akathisia:  No  Handed:  Right  AIMS (if indicated):     Assets:  Communication Skills Desire for Improvement Housing Resilience Social Support  ADL's:  Intact  Cognition:  WNL  Sleep:  ok     Assessment/Plan: Bipolar I disorder (HCC) - Plan: OLANZapine (ZYPREXA) 20 MG tablet  Alcohol use disorder, moderate, in early remission (HCC) - Plan: naltrexone (DEPADE) 50 MG tablet  Patient is stable on his current medication.  He remains sober from drinking.  Continue olanzapine 20 mg daily and naltrexone 50 mg daily.  He was recently given Celexa from PCP but in the future he like to have his prescription from our office.  Recommend to call us back if is any question or any concern.  Follow-up in 3 months.   Follow Up Instructions:     I discussed the assessment and treatment plan  with the patient. The patient was provided an opportunity to ask questions and all were answered. The patient agreed with the plan and demonstrated an understanding of the instructions.   The patient was advised to call back or seek an in-person evaluation if the symptoms worsen or if the condition fails to improve as anticipated.    Collaboration of Care: Other provider involved in patient's care AEB notes are available in epic to review.  Patient/Guardian was advised Release of Information must be obtained prior to any record release in order to collaborate their care with an outside provider. Patient/Guardian was advised if they have not already done so to contact the registration department to sign all necessary forms in order for [place] to release information regarding  their care.   Consent: Patient/Guardian gives verbal consent for treatment and assignment of benefits for services provided during this visit. Patient/Guardian expressed understanding and agreed to proceed.     Note: This document was prepared by [employer] voice dictation technology and any errors that results from this process are unintentional.    Cleotis Nipper, MD 08/04/2022

## 2022-08-08 ENCOUNTER — Other Ambulatory Visit: Payer: Self-pay | Admitting: Nurse Practitioner

## 2022-08-08 DIAGNOSIS — N3281 Overactive bladder: Secondary | ICD-10-CM

## 2022-09-08 DIAGNOSIS — M5412 Radiculopathy, cervical region: Secondary | ICD-10-CM | POA: Diagnosis not present

## 2022-10-07 ENCOUNTER — Ambulatory Visit: Payer: Medicaid Other | Admitting: Dermatology

## 2022-11-03 ENCOUNTER — Telehealth (HOSPITAL_COMMUNITY): Payer: Medicare HMO | Admitting: Psychiatry

## 2022-11-05 ENCOUNTER — Other Ambulatory Visit: Payer: Self-pay | Admitting: Nurse Practitioner

## 2022-11-05 DIAGNOSIS — N3281 Overactive bladder: Secondary | ICD-10-CM

## 2022-11-11 ENCOUNTER — Telehealth (HOSPITAL_BASED_OUTPATIENT_CLINIC_OR_DEPARTMENT_OTHER): Payer: Medicare HMO | Admitting: Psychiatry

## 2022-11-11 ENCOUNTER — Encounter (HOSPITAL_COMMUNITY): Payer: Self-pay | Admitting: Psychiatry

## 2022-11-11 VITALS — Wt 188.0 lb

## 2022-11-11 DIAGNOSIS — F1021 Alcohol dependence, in remission: Secondary | ICD-10-CM

## 2022-11-11 DIAGNOSIS — F319 Bipolar disorder, unspecified: Secondary | ICD-10-CM | POA: Diagnosis not present

## 2022-11-11 MED ORDER — CITALOPRAM HYDROBROMIDE 10 MG PO TABS: 10.0000 mg | ORAL_TABLET | Freq: Every day | ORAL | 0 refills | Status: DC

## 2022-11-11 MED ORDER — OLANZAPINE 20 MG PO TABS: 20.0000 mg | ORAL_TABLET | Freq: Every day | ORAL | 0 refills | Status: DC

## 2022-11-11 MED ORDER — NALTREXONE HCL 50 MG PO TABS: 50.0000 mg | ORAL_TABLET | Freq: Every day | ORAL | 0 refills | Status: DC

## 2022-11-11 NOTE — Progress Notes (Signed)
[facility] MD Virtual Progress Note   Patient Location: Home Provider Location: Office  I connect with patient by video and verified that I am speaking with correct person by using two identifiers. I discussed the limitations of evaluation and management by telemedicine and the availability of in person appointments. I also discussed with the patient that there may be a patient responsible charge related to this service. The patient expressed understanding and agreed to proceed.  Billy Anderson [id number] 57 y.o.  11/11/2022 2:14 PM  History of Present Illness:  Patient is evaluated by video session.  He is taking the medication as prescribed.  He remains sober from drinking for almost a year.  He feels the naltrexone and Celexa are working.  He denies any crying spells or any feeling of hopelessness or worthlessness.  He has no tremor or shakes or any EPS.  He has no seizures in recent months.  He is taking the Detrol from neurology.  His appetite is okay.  His weight is stable.  Denies any mania, impulsive behavior, agitation, anger, excessive spending.  His sleep better with the olanzapine that has been very helpful.  He like to get Celexa from our office as he has no more refills and in the past it was given by primary care.  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  Started drinking at age 61. Multiple rehab for alcohol addiction. H/O low sodium and seizure. Tried lithium, Seroquel, Vraylar with poor outcome.  Given Celexa by PCP but stopped.  No history of suicidal attempt.  History of mania with excessive spending and grandiosity.  No history of abuse.     Outpatient Encounter Medications as of 11/11/2022  Medication Sig   aspirin EC 81 MG tablet Take 1 tablet (81 mg total) by mouth daily.   Calcium Carb-Cholecalciferol (CALCIUM 600+D3 PO) Take 1 tablet by mouth 2 (two) times daily.   citalopram (CELEXA) 10 MG tablet TAKE  1 TABLET BY MOUTH AT BEDTIME   diclofenac Sodium (VOLTAREN) 1 % GEL Apply 2 g topically 4 (four) times daily as needed (pain). (Patient not taking: Reported on 05/12/2022)   diltiazem (CARDIZEM CD) 180 MG 24 hr capsule Take 2 capsules (360 mg total) by mouth daily.   Fexofenadine HCl (ALLEGRA PO) Take 180 mg by mouth daily.   folic acid (FOLVITE) 1 MG tablet Take 1 tablet (1 mg total) by mouth daily. (Patient not taking: Reported on 03/12/2022)   folic acid (FOLVITE) 400 MCG tablet Take 400 mcg by mouth daily. (Patient not taking: Reported on 05/12/2022)   gabapentin (NEURONTIN) 300 MG capsule Take 1 capsule (300 mg total) by mouth 2 (two) times daily.   HYDROcodone-acetaminophen (NORCO) 7.5-325 MG tablet Take 1 tablet by mouth every 6 (six) hours.   lamoTRIgine (LAMICTAL) 100 MG tablet Take 2 tablets (200 mg total) by mouth at bedtime.   melatonin 3 MG TABS tablet Take 1 tablet (3 mg total) by mouth at bedtime as needed (for sleep). (Patient not taking: Reported on 05/12/2022)   Multiple Vitamin (MULTIVITAMIN) tablet Take 1 tablet by mouth daily.   naltrexone (DEPADE) 50 MG tablet Take 1 tablet (50 mg total) by mouth daily.   OLANZapine (ZYPREXA) 20 MG tablet Take 1 tablet (20 mg total) by mouth at bedtime.   oxybutynin (DITROPAN-XL) 10 MG 24 hr tablet TAKE 1 TABLET BY MOUTH AT BEDTIME   pantoprazole (PROTONIX) 40 MG tablet Take 1 tablet (40 mg total) by mouth  daily.   polyethylene glycol powder (GLYCOLAX/MIRALAX) 17 GM/SCOOP powder Take 17 g by mouth 2 (two) times daily as needed for mild constipation. (Patient not taking: Reported on 05/12/2022)   pyridOXINE (VITAMIN B6) 100 MG tablet Take 1 tablet (100 mg total) by mouth daily.   sodium chloride 1 g tablet Take 1 tablet (1 g total) by mouth 3 (three) times daily with meals.   thiamine (VITAMIN B1) 100 MG tablet Take 1 tablet (100 mg total) by mouth daily.   tiZANidine (ZANAFLEX) 4 MG tablet Take 1 tablet (4 mg total) by mouth every 6 (six) hours as  needed for muscle spasms.   No facility-administered encounter medications on file as of 11/11/2022.    No results found for this or any previous visit (from the past 2160 hour(s)).   Psychiatric Specialty Exam: Physical Exam  Review of Systems  Weight 188 lb (85.3 kg).There is no height or weight on file to calculate BMI.  General Appearance: Casual  Eye Contact:  Fair  Speech:  Slow  Volume:  Decreased  Mood:  Euthymic  Affect:  Appropriate  Thought Process:  Goal Directed  Orientation:  Full (Time, Place, and Person)  Thought Content:  WDL  Suicidal Thoughts:  No  Homicidal Thoughts:  No  Memory:  Immediate;   Good Recent;   Good Remote;   Fair  Judgement:  Intact  Insight:  Present  Psychomotor Activity:  Decreased  Concentration:  Concentration: Fair and Attention Span: Fair  Recall:  [employer] of Knowledge:  Fair  Language:  Good  Akathisia:  No  Handed:  Right  AIMS (if indicated):     Assets:  Communication Skills Desire for Improvement Housing Resilience Social Support  ADL's:  Intact  Cognition:  WNL  Sleep:  ok     Assessment/Plan: Bipolar I disorder (HCC) - Plan: OLANZapine (ZYPREXA) 20 MG tablet, citalopram (CELEXA) 10 MG tablet  Alcohol use disorder, moderate, in early remission (HCC) - Plan: naltrexone (DEPADE) 50 MG tablet, citalopram (CELEXA) 10 MG tablet  Patient is stable on current medication.  Continue olanzapine 20 mg daily, naltrexone 50 mg daily and Celexa 10 mg daily.  Recommended to call us back if is any question or any concern.  Follow-up in 3 months   Follow Up Instructions:     I discussed the assessment and treatment plan with the patient. The patient was provided an opportunity to ask questions and all were answered. The patient agreed with the plan and demonstrated an understanding of the instructions.   The patient was advised to call back or seek an in-person evaluation if the symptoms worsen or if the condition fails to  improve as anticipated.    Collaboration of Care: Other provider involved in patient's care AEB notes are available in epic to review  Patient/Guardian was advised Release of Information must be obtained prior to any record release in order to collaborate their care with an outside provider. Patient/Guardian was advised if they have not already done so to contact the registration department to sign all necessary forms in order for [place] to release information regarding their care.   Consent: Patient/Guardian gives verbal consent for treatment and assignment of benefits for services provided during this visit. Patient/Guardian expressed understanding and agreed to proceed.     I provided 16 minutes of non face to face time during this encounter.  Note: This document was prepared by [employer] voice dictation technology and any errors that results from this  process are unintentional.    Cleotis Nipper, MD 11/11/2022

## 2022-11-19 ENCOUNTER — Ambulatory Visit: Payer: Medicare Other | Admitting: Neurology

## 2022-12-07 DIAGNOSIS — Z6825 Body mass index (BMI) 25.0-25.9, adult: Secondary | ICD-10-CM | POA: Diagnosis not present

## 2022-12-07 DIAGNOSIS — M47812 Spondylosis without myelopathy or radiculopathy, cervical region: Secondary | ICD-10-CM | POA: Diagnosis not present

## 2022-12-07 DIAGNOSIS — G5602 Carpal tunnel syndrome, left upper limb: Secondary | ICD-10-CM | POA: Diagnosis not present

## 2022-12-31 ENCOUNTER — Other Ambulatory Visit: Payer: Self-pay | Admitting: Nurse Practitioner

## 2023-01-19 ENCOUNTER — Emergency Department (HOSPITAL_COMMUNITY): Payer: Medicare HMO

## 2023-01-19 ENCOUNTER — Encounter (HOSPITAL_COMMUNITY): Payer: Self-pay

## 2023-01-19 ENCOUNTER — Other Ambulatory Visit: Payer: Self-pay

## 2023-01-19 ENCOUNTER — Inpatient Hospital Stay (HOSPITAL_COMMUNITY)
Admission: EM | Admit: 2023-01-19 | Discharge: 2023-01-21 | DRG: 603 | Disposition: A | Discharge status: Home Health | Payer: Medicare HMO | Attending: Internal Medicine | Admitting: Internal Medicine

## 2023-01-19 DIAGNOSIS — L03116 Cellulitis of left lower limb: Secondary | ICD-10-CM | POA: Diagnosis not present

## 2023-01-19 DIAGNOSIS — Z7982 Long term (current) use of aspirin: Secondary | ICD-10-CM

## 2023-01-19 DIAGNOSIS — Z79899 Other long term (current) drug therapy: Secondary | ICD-10-CM

## 2023-01-19 DIAGNOSIS — F319 Bipolar disorder, unspecified: Secondary | ICD-10-CM | POA: Diagnosis not present

## 2023-01-19 DIAGNOSIS — F17210 Nicotine dependence, cigarettes, uncomplicated: Secondary | ICD-10-CM | POA: Diagnosis present

## 2023-01-19 DIAGNOSIS — Z818 Family history of other mental and behavioral disorders: Secondary | ICD-10-CM

## 2023-01-19 DIAGNOSIS — R609 Edema, unspecified: Secondary | ICD-10-CM | POA: Diagnosis not present

## 2023-01-19 DIAGNOSIS — R0989 Other specified symptoms and signs involving the circulatory and respiratory systems: Secondary | ICD-10-CM | POA: Diagnosis not present

## 2023-01-19 DIAGNOSIS — K219 Gastro-esophageal reflux disease without esophagitis: Secondary | ICD-10-CM | POA: Diagnosis present

## 2023-01-19 DIAGNOSIS — I480 Paroxysmal atrial fibrillation: Secondary | ICD-10-CM | POA: Diagnosis not present

## 2023-01-19 DIAGNOSIS — I10 Essential (primary) hypertension: Secondary | ICD-10-CM | POA: Diagnosis present

## 2023-01-19 DIAGNOSIS — E871 Hypo-osmolality and hyponatremia: Secondary | ICD-10-CM | POA: Diagnosis present

## 2023-01-19 DIAGNOSIS — G40909 Epilepsy, unspecified, not intractable, without status epilepticus: Secondary | ICD-10-CM | POA: Diagnosis not present

## 2023-01-19 DIAGNOSIS — M48061 Spinal stenosis, lumbar region without neurogenic claudication: Secondary | ICD-10-CM | POA: Diagnosis present

## 2023-01-19 DIAGNOSIS — R569 Unspecified convulsions: Secondary | ICD-10-CM

## 2023-01-19 DIAGNOSIS — L4050 Arthropathic psoriasis, unspecified: Secondary | ICD-10-CM | POA: Diagnosis present

## 2023-01-19 DIAGNOSIS — L03119 Cellulitis of unspecified part of limb: Secondary | ICD-10-CM

## 2023-01-19 DIAGNOSIS — F1010 Alcohol abuse, uncomplicated: Secondary | ICD-10-CM | POA: Diagnosis not present

## 2023-01-19 DIAGNOSIS — N3281 Overactive bladder: Secondary | ICD-10-CM | POA: Diagnosis present

## 2023-01-19 DIAGNOSIS — D84821 Immunodeficiency due to drugs: Secondary | ICD-10-CM | POA: Diagnosis present

## 2023-01-19 DIAGNOSIS — Z79620 Long term (current) use of immunosuppressive biologic: Secondary | ICD-10-CM | POA: Diagnosis not present

## 2023-01-19 DIAGNOSIS — R0602 Shortness of breath: Secondary | ICD-10-CM | POA: Diagnosis not present

## 2023-01-19 DIAGNOSIS — L03115 Cellulitis of right lower limb: Secondary | ICD-10-CM | POA: Diagnosis not present

## 2023-01-19 DIAGNOSIS — F3110 Bipolar disorder, current episode manic without psychotic features, unspecified: Secondary | ICD-10-CM | POA: Diagnosis present

## 2023-01-19 DIAGNOSIS — F1011 Alcohol abuse, in remission: Secondary | ICD-10-CM | POA: Diagnosis present

## 2023-01-19 DIAGNOSIS — R509 Fever, unspecified: Secondary | ICD-10-CM | POA: Diagnosis not present

## 2023-01-19 DIAGNOSIS — K7460 Unspecified cirrhosis of liver: Secondary | ICD-10-CM | POA: Diagnosis not present

## 2023-01-19 LAB — URINALYSIS, COMPLETE (UACMP) WITH MICROSCOPIC
Bacteria, UA: NONE SEEN
Bilirubin Urine: NEGATIVE
Glucose, UA: NEGATIVE mg/dL
Hgb urine dipstick: NEGATIVE
Ketones, ur: 5 mg/dL — AB
Leukocytes,Ua: NEGATIVE
Nitrite: NEGATIVE
Protein, ur: NEGATIVE mg/dL
Specific Gravity, Urine: 1.012 (ref 1.005–1.030)
pH: 7 (ref 5.0–8.0)

## 2023-01-19 LAB — CBC WITH DIFFERENTIAL/PLATELET
Abs Immature Granulocytes: 0.04 10*3/uL (ref 0.00–0.07)
Basophils Absolute: 0.1 10*3/uL (ref 0.0–0.1)
Basophils Relative: 0 %
Eosinophils Absolute: 0.2 10*3/uL (ref 0.0–0.5)
Eosinophils Relative: 1 %
HCT: 43.8 % (ref 39.0–52.0)
Hemoglobin: 15.8 g/dL (ref 13.0–17.0)
Immature Granulocytes: 0 %
Lymphocytes Relative: 10 %
Lymphs Abs: 1.4 10*3/uL (ref 0.7–4.0)
MCH: 35.7 pg — ABNORMAL HIGH (ref 26.0–34.0)
MCHC: 36.1 g/dL — ABNORMAL HIGH (ref 30.0–36.0)
MCV: 98.9 fL (ref 80.0–100.0)
Monocytes Absolute: 1.2 10*3/uL — ABNORMAL HIGH (ref 0.1–1.0)
Monocytes Relative: 9 %
Neutro Abs: 10.7 10*3/uL — ABNORMAL HIGH (ref 1.7–7.7)
Neutrophils Relative %: 80 %
Platelets: 257 10*3/uL (ref 150–400)
RBC: 4.43 MIL/uL (ref 4.22–5.81)
RDW: 13.6 % (ref 11.5–15.5)
WBC: 13.6 10*3/uL — ABNORMAL HIGH (ref 4.0–10.5)
nRBC: 0 % (ref 0.0–0.2)

## 2023-01-19 LAB — I-STAT CHEM 8, ED
BUN: 6 mg/dL (ref 6–20)
Calcium, Ion: 1.07 mmol/L — ABNORMAL LOW (ref 1.15–1.40)
Chloride: 87 mmol/L — ABNORMAL LOW (ref 98–111)
Creatinine, Ser: 0.6 mg/dL — ABNORMAL LOW (ref 0.61–1.24)
Glucose, Bld: 80 mg/dL (ref 70–99)
HCT: 49 % (ref 39.0–52.0)
Hemoglobin: 16.7 g/dL (ref 13.0–17.0)
Potassium: 4.1 mmol/L (ref 3.5–5.1)
Sodium: 123 mmol/L — ABNORMAL LOW (ref 135–145)
TCO2: 24 mmol/L (ref 22–32)

## 2023-01-19 LAB — COMPREHENSIVE METABOLIC PANEL
ALT: 15 U/L (ref 0–44)
AST: 41 U/L (ref 15–41)
Albumin: 2.9 g/dL — ABNORMAL LOW (ref 3.5–5.0)
Alkaline Phosphatase: 76 U/L (ref 38–126)
Anion gap: 8 (ref 5–15)
BUN: 8 mg/dL (ref 6–20)
CO2: 24 mmol/L (ref 22–32)
Calcium: 8.2 mg/dL — ABNORMAL LOW (ref 8.9–10.3)
Chloride: 92 mmol/L — ABNORMAL LOW (ref 98–111)
Creatinine, Ser: 0.56 mg/dL — ABNORMAL LOW (ref 0.61–1.24)
GFR, Estimated: >60 mL/min (ref >60)
Glucose, Bld: 137 mg/dL — ABNORMAL HIGH (ref 70–99)
Potassium: 4.4 mmol/L (ref 3.5–5.1)
Sodium: 124 mmol/L — ABNORMAL LOW (ref 135–145)
Total Bilirubin: 1.4 mg/dL — ABNORMAL HIGH (ref 0.0–1.2)
Total Protein: 6.7 g/dL (ref 6.5–8.1)

## 2023-01-19 LAB — BASIC METABOLIC PANEL
Anion gap: 10 (ref 5–15)
BUN: 8 mg/dL (ref 6–20)
CO2: 24 mmol/L (ref 22–32)
Calcium: 8.5 mg/dL — ABNORMAL LOW (ref 8.9–10.3)
Chloride: 84 mmol/L — ABNORMAL LOW (ref 98–111)
Creatinine, Ser: 0.53 mg/dL — ABNORMAL LOW (ref 0.61–1.24)
GFR, Estimated: >60 mL/min (ref >60)
Glucose, Bld: 87 mg/dL (ref 70–99)
Potassium: 3.8 mmol/L (ref 3.5–5.1)
Sodium: 118 mmol/L — CL (ref 135–145)

## 2023-01-19 LAB — BRAIN NATRIURETIC PEPTIDE: B Natriuretic Peptide: 82.6 pg/mL (ref 0.0–100.0)

## 2023-01-19 LAB — HIV ANTIBODY (ROUTINE TESTING W REFLEX): HIV Screen 4th Generation wRfx: NONREACTIVE

## 2023-01-19 LAB — NA AND K (SODIUM & POTASSIUM), RAND UR
Potassium Urine: 29 mmol/L
Sodium, Ur: 90 mmol/L

## 2023-01-19 LAB — I-STAT CG4 LACTIC ACID, ED
Lactic Acid, Venous: 2.4 mmol/L (ref 0.5–1.9)
Lactic Acid, Venous: 0.7 mmol/L (ref 0.5–1.9)

## 2023-01-19 LAB — CREATININE, URINE, RANDOM: Creatinine, Urine: 76 mg/dL

## 2023-01-19 LAB — PHOSPHORUS: Phosphorus: 2.7 mg/dL (ref 2.5–4.6)

## 2023-01-19 LAB — OSMOLALITY: Osmolality: 276 mosm/kg (ref 275–295)

## 2023-01-19 LAB — OSMOLALITY, URINE: Osmolality, Ur: 406 mosm/kg (ref 300–900)

## 2023-01-19 LAB — MAGNESIUM: Magnesium: 1.8 mg/dL (ref 1.7–2.4)

## 2023-01-19 MED ORDER — ACETAMINOPHEN 650 MG RE SUPP: 650.0000 mg | Freq: Four times a day (QID) | RECTAL | Status: DC | PRN

## 2023-01-19 MED ORDER — THIAMINE HCL 100 MG/ML IJ SOLN
100.0000 mg | Freq: Every day | INTRAMUSCULAR | Status: DC
Start: 2023-01-19 — End: 2023-01-21
  Administered 2023-01-19: 100 mg via INTRAVENOUS
  Filled 2023-01-19: qty 2

## 2023-01-19 MED ORDER — ENSURE ENLIVE PO LIQD
237.0000 mL | Freq: Three times a day (TID) | ORAL | Status: DC
Start: 2023-01-19 — End: 2023-01-21
  Administered 2023-01-19 – 2023-01-21 (×5): 237 mL via ORAL
  Filled 2023-01-19 (×3): qty 237

## 2023-01-19 MED ORDER — LORAZEPAM 1 MG PO TABS: 0.0000 mg | ORAL_TABLET | Freq: Two times a day (BID) | ORAL | Status: DC

## 2023-01-19 MED ORDER — SODIUM CHLORIDE 0.9 % IV BOLUS
1000.0000 mL | Freq: Once | INTRAVENOUS | Status: AC
  Administered 2023-01-19: 1000 mL via INTRAVENOUS

## 2023-01-19 MED ORDER — SODIUM CHLORIDE 1 G PO TABS
2.0000 g | ORAL_TABLET | Freq: Three times a day (TID) | ORAL | Status: DC
  Administered 2023-01-19: 2 g via ORAL
  Filled 2023-01-19 (×2): qty 2

## 2023-01-19 MED ORDER — VANCOMYCIN HCL IN DEXTROSE 1-5 GM/200ML-% IV SOLN
1000.0000 mg | Freq: Once | INTRAVENOUS | Status: AC
  Administered 2023-01-19: 1000 mg via INTRAVENOUS
  Filled 2023-01-19: qty 200

## 2023-01-19 MED ORDER — ALUM & MAG HYDROXIDE-SIMETH 200-200-20 MG/5ML PO SUSP
30.0000 mL | Freq: Four times a day (QID) | ORAL | Status: DC | PRN
  Administered 2023-01-19 – 2023-01-20 (×2): 30 mL via ORAL
  Filled 2023-01-19 (×2): qty 30

## 2023-01-19 MED ORDER — FOLIC ACID 1 MG PO TABS
1.0000 mg | ORAL_TABLET | Freq: Every day | ORAL | Status: DC
Start: 2023-01-19 — End: 2023-01-21
  Administered 2023-01-19 – 2023-01-21 (×3): 1 mg via ORAL
  Filled 2023-01-19 (×3): qty 1

## 2023-01-19 MED ORDER — THIAMINE MONONITRATE 100 MG PO TABS
100.0000 mg | ORAL_TABLET | Freq: Every day | ORAL | Status: DC
Start: 2023-01-19 — End: 2023-01-21
  Administered 2023-01-20 – 2023-01-21 (×2): 100 mg via ORAL
  Filled 2023-01-19 (×2): qty 1

## 2023-01-19 MED ORDER — LORAZEPAM 2 MG/ML IJ SOLN: 0.0000 mg | Freq: Two times a day (BID) | INTRAMUSCULAR | Status: DC

## 2023-01-19 MED ORDER — PANTOPRAZOLE SODIUM 40 MG PO TBEC
40.0000 mg | DELAYED_RELEASE_TABLET | Freq: Every day | ORAL | Status: DC
  Administered 2023-01-19 – 2023-01-21 (×3): 40 mg via ORAL
  Filled 2023-01-19 (×3): qty 1

## 2023-01-19 MED ORDER — OXYBUTYNIN CHLORIDE ER 5 MG PO TB24
10.0000 mg | ORAL_TABLET | Freq: Every day | ORAL | Status: DC
  Administered 2023-01-19 – 2023-01-20 (×2): 10 mg via ORAL
  Filled 2023-01-19 (×2): qty 2
  Filled 2023-01-19: qty 1

## 2023-01-19 MED ORDER — LORAZEPAM 1 MG PO TABS: 0.0000 mg | ORAL_TABLET | Freq: Four times a day (QID) | ORAL | Status: DC

## 2023-01-19 MED ORDER — ENOXAPARIN SODIUM 40 MG/0.4ML IJ SOSY
40.0000 mg | PREFILLED_SYRINGE | INTRAMUSCULAR | Status: DC
Start: 2023-01-19 — End: 2023-01-21
  Administered 2023-01-19 – 2023-01-20 (×2): 40 mg via SUBCUTANEOUS
  Filled 2023-01-19 (×2): qty 0.4

## 2023-01-19 MED ORDER — THIAMINE HCL 100 MG/ML IJ SOLN: 100.0000 mg | Freq: Every day | INTRAMUSCULAR | Status: DC

## 2023-01-19 MED ORDER — LAMOTRIGINE 100 MG PO TABS
200.0000 mg | ORAL_TABLET | Freq: Every day | ORAL | Status: DC
  Administered 2023-01-19 – 2023-01-20 (×2): 200 mg via ORAL
  Filled 2023-01-19 (×2): qty 2

## 2023-01-19 MED ORDER — OLANZAPINE 10 MG PO TABS
20.0000 mg | ORAL_TABLET | Freq: Every day | ORAL | Status: DC
  Administered 2023-01-19 – 2023-01-20 (×2): 20 mg via ORAL
  Filled 2023-01-19 (×3): qty 2

## 2023-01-19 MED ORDER — ONDANSETRON HCL 4 MG PO TABS
4.0000 mg | ORAL_TABLET | Freq: Four times a day (QID) | ORAL | Status: DC | PRN
  Administered 2023-01-20: 4 mg via ORAL
  Filled 2023-01-19: qty 1

## 2023-01-19 MED ORDER — ASPIRIN 81 MG PO TBEC
81.0000 mg | DELAYED_RELEASE_TABLET | Freq: Every day | ORAL | Status: DC
  Administered 2023-01-19 – 2023-01-21 (×3): 81 mg via ORAL
  Filled 2023-01-19 (×3): qty 1

## 2023-01-19 MED ORDER — ONDANSETRON HCL 4 MG/2ML IJ SOLN: 4.0000 mg | Freq: Four times a day (QID) | INTRAMUSCULAR | Status: DC | PRN

## 2023-01-19 MED ORDER — HYDROCODONE-ACETAMINOPHEN 5-325 MG PO TABS: 1.0000 | ORAL_TABLET | Freq: Four times a day (QID) | ORAL | Status: DC | PRN

## 2023-01-19 MED ORDER — NALTREXONE HCL 50 MG PO TABS
50.0000 mg | ORAL_TABLET | Freq: Every day | ORAL | Status: DC
  Administered 2023-01-19 – 2023-01-20 (×2): 50 mg via ORAL
  Filled 2023-01-19 (×3): qty 1

## 2023-01-19 MED ORDER — SODIUM CHLORIDE 0.9 % IV SOLN
1.0000 g | INTRAVENOUS | Status: DC
  Administered 2023-01-19 – 2023-01-21 (×3): 1 g via INTRAVENOUS
  Filled 2023-01-19 (×3): qty 10

## 2023-01-19 MED ORDER — LORAZEPAM 1 MG PO TABS: 1.0000 mg | ORAL_TABLET | ORAL | Status: DC | PRN

## 2023-01-19 MED ORDER — THIAMINE MONONITRATE 100 MG PO TABS: 100.0000 mg | ORAL_TABLET | Freq: Every day | ORAL | Status: DC

## 2023-01-19 MED ORDER — ACETAMINOPHEN 325 MG PO TABS: 650.0000 mg | ORAL_TABLET | Freq: Four times a day (QID) | ORAL | Status: DC | PRN

## 2023-01-19 MED ORDER — ADULT MULTIVITAMIN W/MINERALS CH
1.0000 | ORAL_TABLET | Freq: Every day | ORAL | Status: DC
  Administered 2023-01-19 – 2023-01-21 (×3): 1 via ORAL
  Filled 2023-01-19 (×3): qty 1

## 2023-01-19 MED ORDER — CITALOPRAM HYDROBROMIDE 20 MG PO TABS
10.0000 mg | ORAL_TABLET | Freq: Every day | ORAL | Status: DC
  Administered 2023-01-19 – 2023-01-20 (×2): 10 mg via ORAL
  Filled 2023-01-19 (×2): qty 1

## 2023-01-19 MED ORDER — LORAZEPAM 2 MG/ML IJ SOLN
0.0000 mg | Freq: Four times a day (QID) | INTRAMUSCULAR | Status: DC
  Administered 2023-01-19: 4 mg via INTRAVENOUS
  Filled 2023-01-19: qty 2

## 2023-01-19 MED ORDER — LORAZEPAM 2 MG/ML IJ SOLN: 1.0000 mg | INTRAMUSCULAR | Status: DC | PRN

## 2023-01-19 MED ORDER — DOCUSATE SODIUM 100 MG PO CAPS
100.0000 mg | ORAL_CAPSULE | Freq: Two times a day (BID) | ORAL | Status: DC
  Administered 2023-01-20: 100 mg via ORAL
  Filled 2023-01-19 (×5): qty 1

## 2023-01-19 MED ORDER — ORAL CARE MOUTH RINSE: 15.0000 mL | OROMUCOSAL | Status: DC | PRN

## 2023-01-19 MED ORDER — LORAZEPAM 2 MG/ML IJ SOLN
0.0000 mg | Freq: Four times a day (QID) | INTRAMUSCULAR | Status: AC
  Administered 2023-01-19: 2 mg via INTRAVENOUS
  Filled 2023-01-19: qty 1

## 2023-01-19 NOTE — ED Triage Notes (Signed)
Pt BIB [facility] for bilateral lower leg and feet edema with erythema and weeping x 4 days with progressive worsening.

## 2023-01-19 NOTE — ED Provider Notes (Signed)
 [facility]-EMERGENCY DEPT [facility] Emergency Department Provider Note MRN:  [id number]  Arrival date & time: 01/19/23     Chief Complaint   Wound Infection   History of Present Illness   Billy Anderson is a 57 y.o. year-old male presents to the ED with chief complaint of lower extremity swelling, weeping, and redness.  States that he noticed the symptoms a few days ago and symptoms have been gradually worsening.  He states that he stays at home and hasn't done anything out of the ordinary.  He normally walks with a walker.  He states that he drinks beer, but not everyday.  He denies fever, pain, or itching.  He denies confusion.     Review of Systems  Pertinent positive and negative review of systems noted in HPI.    Physical Exam   Vitals:   01/19/23 0445 01/19/23 0500  BP: 108/80   Pulse: 94 95  Resp: 17   Temp:    SpO2: 93% 93%    CONSTITUTIONAL:  chronically ill-appearing, NAD NEURO:  Alert and oriented x 3, CN 3-12 grossly intact EYES:  eyes equal and reactive ENT/NECK:  Supple, no stridor  CARDIO:  normal rate, regular rhythm, appears well-perfused  PULM:  No respiratory distress, CTAB GI/GU:  non-distended, no focal tenderness MSK/SPINE:  No gross deformities, no edema, moves all extremities  SKIN:  no rash, atraumatic   *Additional and/or pertinent findings included in MDM below  Diagnostic and Interventional Summary    EKG Interpretation Date/Time:    Ventricular Rate:    PR Interval:    QRS Duration:    QT Interval:    QTC Calculation:   R Axis:      Text Interpretation:         Labs Reviewed  CBC WITH DIFFERENTIAL/PLATELET - Abnormal; Notable for the following components:      Result Value   WBC 13.6 (*)    MCH 35.7 (*)    MCHC 36.1 (*)    Neutro Abs 10.7 (*)    Monocytes Absolute 1.2 (*)    All other components within normal limits  BASIC METABOLIC PANEL - Abnormal; Notable for the following components:   Sodium 118 (*)    Chloride  84 (*)    Creatinine, Ser 0.53 (*)    Calcium  8.5 (*)    All other components within normal limits  I-STAT CHEM 8, ED - Abnormal; Notable for the following components:   Sodium 123 (*)    Chloride 87 (*)    Creatinine, Ser 0.60 (*)    Calcium , Ion 1.07 (*)    All other components within normal limits  I-STAT CG4 LACTIC ACID, ED - Abnormal; Notable for the following components:   Lactic Acid, Venous 2.4 (*)    All other components within normal limits  CULTURE, BLOOD (ROUTINE X 2)  CULTURE, BLOOD (ROUTINE X 2)  BRAIN NATRIURETIC PEPTIDE  I-STAT CG4 LACTIC ACID, ED    DG Chest Port 1 View    (Results Pending)    Medications  LORazepam  (ATIVAN ) injection 0-4 mg (4 mg Intravenous Given 01/19/23 0401)    Or  LORazepam  (ATIVAN ) tablet 0-4 mg ( Oral See Alternative 01/19/23 0401)  LORazepam  (ATIVAN ) injection 0-4 mg (has no administration in time range)    Or  LORazepam  (ATIVAN ) tablet 0-4 mg (has no administration in time range)  thiamine  (VITAMIN B1) tablet 100 mg (has no administration in time range)    Or  thiamine  (VITAMIN B1)  injection 100 mg (has no administration in time range)  sodium chloride  0.9 % bolus 1,000 mL (1,000 mLs Intravenous New Bag/Given 01/19/23 0402)  vancomycin  (VANCOCIN ) IVPB 1000 mg/200 mL premix (1,000 mg Intravenous New Bag/Given 01/19/23 0405)     Procedures  /  Critical Care .Critical Care  Performed by: Vicky Charleston, PA-C Authorized by: Vicky Charleston, PA-C   Critical care provider statement:    Critical care time (minutes):  45   Critical care was necessary to treat or prevent imminent or life-threatening deterioration of the following conditions:  Metabolic crisis   Critical care was time spent personally by me on the following activities:  Development of treatment plan with patient or surrogate, discussions with consultants, evaluation of patient's response to treatment, examination of patient, ordering and review of laboratory studies,  ordering and review of radiographic studies, ordering and performing treatments and interventions, pulse oximetry, re-evaluation of patient's condition and review of old charts   ED Course and Medical Decision Making  I have reviewed the triage vital signs, the nursing notes, and pertinent available records from the EMR.  Social Determinants Affecting Complexity of Care: Patient has no clinically significant social determinants affecting this chief complaint..   ED Course: Clinical Course as of 01/19/23 0536  Tue Jan 19, 2023  0342 Basic metabolic panel(!!) Critically low sodium, might be 2/2 [employer], will give a liter of NS  [RB]  0343 CBC with Differential(!) Leukocytosis to 13.6.  LEs worrisome for cellulitis. [RB]  0535 I-Stat CG4 Lactic Acid(!!) Mildly elevated lactic, getting broad spectrum abx [RB]    Clinical Course User Index [RB] Vicky Charleston, PA-C    Medical Decision Making Patient here with bilateral lower extremity swelling and weeping.  He states that he stays at home.  Hasn't gotten his feet wet.  Denies any injuries.  Exam concerning for cellulitis.  Will start IV vanc.    Labs notable for hyponatremia.  Patient drinks beer, but states he doesn't drink every day.  Hyponatremia might be due to beer drinker potomania.  Will start CIWA protocol in case he is under estimating his ETOH use.  He doesn't seem acutely confused.  He's not had seizure.  I don't think he needs hypertonic at this point.  Discussed with Dr. Haze, who agrees.  Will give some NS.  Will need admission.  Amount and/or Complexity of Data Reviewed Labs: ordered. Decision-making details documented in ED Course. Radiology: ordered.  Risk OTC drugs. Prescription drug management. Decision regarding hospitalization.         Consultants: I consulted with Hospitalist, Dr. Shona, who is appreciated for admitting.   Treatment and Plan: Patient's exam and diagnostic results  are concerning for cellulitis and hyponatremia (probably 2/2 ETOH).  Feel that patient will need admission to the hospital for further treatment and evaluation.  Patient discussed with attending physician, Dr. Haze, who agrees with plan.  Final Clinical Impressions(s) / ED Diagnoses     ICD-10-CM   1. Cellulitis of left lower extremity  L03.116     2. Cellulitis of right lower extremity  L03.115     3. Hyponatremia  E87.1       ED Discharge Orders     None         Discharge Instructions Discussed with and Provided to Patient:   Discharge Instructions   None      Vicky Charleston, PA-C 01/19/23 0539    Haze Lonni PARAS, MD 01/21/23 [phone]

## 2023-01-19 NOTE — H&P (Signed)
 [facility] History and Physical   Patient: Billy Anderson FMW:[id number] DOB: Nov 07, 1965 DOA: 01/19/2023 PCP: Paseda, Folashade R, FNP  DOS: the patient was seen and examined on 01/19/2023  Patient coming from: Home  Chief Complaint: Leg swelling and pain ED TRIAGE note: Pt BIB [facility] for bilateral lower leg and feet edema with erythema and weeping x 4 days with progressive worsening.    HPI: Billy Anderson is a 57 y.o. male with Past medical history of alcohol abuse, bipolar disorder, GERD, seizure disorder, psoriasis on Humira, PAF, overactive bladder, chronic hyponatremia. The patient presented to the hospital with complaints of pain and swelling of his legs ongoing for last 4 days.  Patient That left leg has an ulcer that was weeping making his hands soaking wet. Reports that he has chronic mild swelling of his legs which is significantly worsening now with redness. Reports pain described as crampy in nature and continuous. Denies any vomiting but had some nausea earlier. Denies any diarrhea right now. No abdominal pain. No fever no chills. No injury or trauma reported by the patient. Left leg has an area with chronic appearing scar but the patient denies any prior history of surgery at there. Has some tenderness of his left leg which he is not aware of consideration. Drink 6 beers a day. Active smoker. Does not know on his medication but reports that he is compliant with his medication.  His wife is his caregiver who provides this medication. Normally walks with a walker.  Review of Systems: As mentioned in the history of present illness. All other systems reviewed and are negative.  Past Medical History:  Diagnosis Date   Arthritis    psoriatic arthritis   Bipolar disorder (HCC)    Dysrhythmia    PAF   GERD (gastroesophageal reflux disease)    Hypertension    Pneumonia    Seizures (HCC)    Past Surgical History:  Procedure Laterality Date   ANTERIOR  CERVICAL DECOMP/DISCECTOMY FUSION N/A 03/20/2022   Procedure: Cervical Three-Four, Cervical Four-Five Anterior Cervical Discectomy Fusion;  Surgeon: Gillie Duncans, MD;  Location: [facility] OR;  Service: Neurosurgery;  Laterality: N/A;  RM 20 to follow 3C   HAND SURGERY Left    carpal tunnel release   Social History:  reports that he has been smoking cigarettes. He does not have any smokeless tobacco history on file. He reports that he does not currently use alcohol after a past usage of about 48.0 standard drinks of alcohol per week. He reports that he does not use drugs.  Allergies  Allergen Reactions   Ativan  [Lorazepam ] Other (See Comments)    Confusion  Hallucinations    Roxicodone [Oxycodone] Itching   Ultram [Tramadol] Other (See Comments)    Seizures    Vistaril [Hydroxyzine] Itching and Other (See Comments)    Sedation Not effective in reducing anxiety    Family history reviewed and not pertinent Family History  Problem Relation Age of Onset   Bipolar disorder Mother     Prior to Admission medications   Medication Sig Start Date End Date Taking? Authorizing Provider  aspirin  EC 81 MG tablet Take 1 tablet (81 mg total) by mouth daily. 10/31/21  Yes Love, Sharlet RAMAN, PA-C  Calcium  Carb-Cholecalciferol  (CALCIUM  600+D3 PO) Take 1 tablet by mouth 2 (two) times daily.   Yes [provider]  citalopram  (CELEXA ) 10 MG tablet Take 1 tablet (10 mg total) by mouth at bedtime. 11/11/22  Yes Arfeen, Leni DASEN,  MD  diltiazem  (CARDIZEM  CD) 180 MG 24 hr capsule Take 2 capsules (360 mg total) by mouth daily. 07/20/22  Yes Paseda, Folashade R, FNP  Fexofenadine HCl (ALLEGRA PO) Take 180 mg by mouth daily.   Yes [provider]  HUMIRA, 2 PEN, 40 MG/0.8ML AJKT pen Inject 0.8 mLs into the skin every 14 (fourteen) days. 01/01/23  Yes [provider]  lamoTRIgine  (LAMICTAL ) 100 MG tablet Take 2 tablets (200 mg total) by mouth at bedtime. 01/05/22 01/19/23 Yes Gregg Lek, MD   sodium chloride  1 g tablet Take 1 tablet (1 g total) by mouth 3 (three) times daily with meals. 11/03/21  Yes Paseda, Folashade R, FNP  thiamine  (VITAMIN B1) 100 MG tablet Take 1 tablet (100 mg total) by mouth daily. 10/31/21  Yes Love, Sharlet RAMAN, PA-C  tiZANidine  (ZANAFLEX ) 4 MG tablet Take 1 tablet (4 mg total) by mouth every 6 (six) hours as needed for muscle spasms. 03/21/22  Yes Gillie Duncans, MD  Multiple Vitamin (MULTIVITAMIN) tablet Take 1 tablet by mouth daily. 10/22/21   Gonfa, Taye T, MD  naltrexone  (DEPADE) 50 MG tablet Take 1 tablet (50 mg total) by mouth daily. 11/11/22   Arfeen, Leni DASEN, MD  OLANZapine  (ZYPREXA ) 20 MG tablet Take 1 tablet (20 mg total) by mouth at bedtime. 11/11/22   Arfeen, Leni DASEN, MD  oxybutynin  (DITROPAN -XL) 10 MG 24 hr tablet TAKE 1 TABLET BY MOUTH AT BEDTIME 11/05/22   Paseda, Folashade R, FNP  pantoprazole  (PROTONIX ) 40 MG tablet TAKE 1 TABLET BY MOUTH DAILY 01/01/23   Paseda, Folashade R, FNP  pyridOXINE  (VITAMIN B6) 100 MG tablet Take 1 tablet (100 mg total) by mouth daily. 10/31/21   Maurice Sharlet RAMAN, PA-C    Physical Exam: Vitals:   01/19/23 0500 01/19/23 0621 01/19/23 0857 01/19/23 0902  BP:   (!) 143/77 135/78  Pulse: 95  99 97  Resp:   19   Temp:  98 F (36.7 C) 99.3 F (37.4 C)   TempSrc:  Oral Oral   SpO2: 93%  97%     General: alert and oriented to time, place, and person. Appear in moderate distress, affect flat in affect Eyes: PERRL, Conjunctiva normal ENT: Oral Mucosa Clear, moist  Neck: no JVD, no Abnormal Mass Or lumps Cardiovascular: S1 and S2 Present, no Murmur, peripheral pulses symmetrical Respiratory: good respiratory effort, Bilateral Air entry equal and Decreased, no signs of accessory muscle use, Clear to Auscultation, no Crackles, no wheezes Abdomen: Bowel Sound present, Soft and no tenderness, no hernia Skin: Significant swelling of bilateral lower extremity, chronic appearing redness involving both upper and lower extremity  with warmth.  Chronic appearing scar on the left leg with surrounding induration and contracture, scabs on the left leg. Extremities: bilateral  Pedal edema, left calf tenderness Neurologic: without any new focal findings  Data Reviewed: I have personally reviewed and interpreted labs, imaging as discussed below.  CBC: Recent Labs  Lab 01/19/23 0243 01/19/23 0253  WBC 13.6*  --   NEUTROABS 10.7*  --   HGB 15.8 16.7  HCT 43.8 49.0  MCV 98.9  --   PLT 257  --    Basic Metabolic Panel: Recent Labs  Lab 01/19/23 0243 01/19/23 0253  NA 118* 123*  K 3.8 4.1  CL 84* 87*  CO2 24  --   GLUCOSE 87 80  BUN 8 6  CREATININE 0.53* 0.60*  CALCIUM  8.5*  --    GFR: CrCl cannot be calculated (Unknown  ideal weight.). Liver Function Tests: No results for input(s): AST, ALT, ALKPHOS, BILITOT, PROT, ALBUMIN in the last 168 hours. No results for input(s): LIPASE, AMYLASE in the last 168 hours. No results for input(s): AMMONIA in the last 168 hours. Coagulation Profile: No results for input(s): INR, PROTIME in the last 168 hours. Cardiac Enzymes: No results for input(s): CKTOTAL, CKMB, CKMBINDEX, TROPONINI in the last 168 hours. BNP (last 3 results) No results for input(s): PROBNP in the last 8760 hours. HbA1C: No results for input(s): HGBA1C in the last 72 hours. CBG: No results for input(s): GLUCAP in the last 168 hours. Lipid Profile: No results for input(s): CHOL, HDL, LDLCALC, TRIG, CHOLHDL, LDLDIRECT in the last 72 hours. Thyroid Function Tests: No results for input(s): TSH, T4TOTAL, FREET4, T3FREE, THYROIDAB in the last 72 hours. Anemia Panel: No results for input(s): VITAMINB12, FOLATE, FERRITIN, TIBC, IRON, RETICCTPCT in the last 72 hours. Urine analysis: No results found for: COLORURINE, APPEARANCEUR, LABSPEC, PHURINE, GLUCOSEU, HGBUR, BILIRUBINUR, KETONESUR, PROTEINUR, UROBILINOGEN,  NITRITE, LEUKOCYTESUR  Radiological Exams on Admission: DG Chest Port 1 View Result Date: 01/19/2023 CLINICAL DATA:  Shortness of breath. EXAM: PORTABLE CHEST 1 VIEW COMPARISON:  None Available. FINDINGS: Low lung volumes. Cardiopericardial silhouette is at upper limits of normal for size. There is pulmonary vascular congestion without overt pulmonary edema. No focal consolidation, pneumothorax, or evidence of pleural effusion. No acute bony abnormality. Telemetry leads overlie the chest. IMPRESSION: Low volume film with pulmonary vascular congestion. Electronically Signed   By: Camellia Candle M.D.   On: 01/19/2023 05:47   I reviewed all nursing notes, pharmacy notes, vitals, pertinent old records.  Assessment/Plan Cellulitis of lower extremity Primary reason for patient's presentation. Started on IV antibiotic vancomycin  and ceftriaxone . Will continue with ceftriaxone  for now. Monitor for progression.  Bilateral leg edema. Calf tenderness. Check ultrasound Doppler to rule out DVT.  Hyponatremia. Acute on chronic. Sodium level at baseline is around 130s. Takes salt tablets on a daily basis. Sodium on admission 118.  Improving to 123. Will recheck. Free water restriction. Likely beer potomania. Patient received IV fluid earlier.  Will continue with sodium tablets.  Increase to 2 g 3 times daily.  Essential hypertension Blood pressure stable.  On Cardizem  at home for A-fib.  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 Cirrhosis appears to be under control. Monitor.  Paroxysmal atrial fibrillation (HCC) CHA2DS2-VASc 1. On aspirin . Will continue. On Cardizem .  Will continue to lower dose.  Bipolar I disorder, most recent episode (or current) manic (HCC) Seizure (HCC) Alcohol abuse Patient is on Lamictal .  200 mg daily.  Patient confirms that he is taking the dose at home and compliant with it. Also on naltrexone  and Zyprexa . Continue Celexa   as well. Continues to drink 6 beers a day.  Recommended to continue.  Clinically patient has a plan to consider rehab outpatient. On CIWA protocol with thiamine  and folic acid .  GERD. Continue PPI.  Overactive bladder. Ditropan .  Will continue.    Nutrition: Regular diet.  Fluid restriction DVT Prophylaxis: Subcutaneous Lovenox   Advance goals of care discussion:   Code Status: Full Code   Consults: none   Family Communication: no family was present at bedside, at the time of interview.   Author: Yetta Blanch, MD [facility] 01/19/2023 10:11 AM   To reach On-call, see care teams to locate the attending and reach out to them via [email or web address]. If 7PM-7AM, please contact night-coverage If you still have difficulty reaching the attending provider, please page  the [facility] (Director on Call) for [facility] on amion for assistance.

## 2023-01-19 NOTE — Progress Notes (Signed)
 ED Pharmacy Antibiotic Sign Off An antibiotic consult was received from an ED provider for Vancomcyin per pharmacy dosing for cellulitis. A chart review was completed to assess appropriateness.   The following one time order(s) were placed:  Vancomycin  1gm IV  Further antibiotic and/or antibiotic pharmacy consults should be ordered by the admitting provider if indicated.   Thank you for allowing pharmacy to be a part of this patient's care.   Kemp Arvin Fletcher, [facility]  Clinical Pharmacist 01/19/23 3:50 AM

## 2023-01-20 ENCOUNTER — Inpatient Hospital Stay (HOSPITAL_COMMUNITY): Payer: Medicare HMO

## 2023-01-20 DIAGNOSIS — R609 Edema, unspecified: Secondary | ICD-10-CM | POA: Diagnosis not present

## 2023-01-20 DIAGNOSIS — L03115 Cellulitis of right lower limb: Secondary | ICD-10-CM | POA: Diagnosis not present

## 2023-01-20 DIAGNOSIS — L03116 Cellulitis of left lower limb: Secondary | ICD-10-CM

## 2023-01-20 LAB — CBC WITH DIFFERENTIAL/PLATELET
Abs Immature Granulocytes: 0.04 10*3/uL (ref 0.00–0.07)
Basophils Absolute: 0 10*3/uL (ref 0.0–0.1)
Basophils Relative: 0 %
Eosinophils Absolute: 0.3 10*3/uL (ref 0.0–0.5)
Eosinophils Relative: 3 %
HCT: 40.2 % (ref 39.0–52.0)
Hemoglobin: 14.5 g/dL (ref 13.0–17.0)
Immature Granulocytes: 1 %
Lymphocytes Relative: 26 %
Lymphs Abs: 2.1 10*3/uL (ref 0.7–4.0)
MCH: 36.3 pg — ABNORMAL HIGH (ref 26.0–34.0)
MCHC: 36.1 g/dL — ABNORMAL HIGH (ref 30.0–36.0)
MCV: 100.8 fL — ABNORMAL HIGH (ref 80.0–100.0)
Monocytes Absolute: 0.9 10*3/uL (ref 0.1–1.0)
Monocytes Relative: 11 %
Neutro Abs: 5 10*3/uL (ref 1.7–7.7)
Neutrophils Relative %: 59 %
Platelets: 246 10*3/uL (ref 150–400)
RBC: 3.99 MIL/uL — ABNORMAL LOW (ref 4.22–5.81)
RDW: 13.7 % (ref 11.5–15.5)
WBC: 8.3 10*3/uL (ref 4.0–10.5)
nRBC: 0 % (ref 0.0–0.2)

## 2023-01-20 LAB — COMPREHENSIVE METABOLIC PANEL
ALT: 15 U/L (ref 0–44)
AST: 25 U/L (ref 15–41)
Albumin: 2.8 g/dL — ABNORMAL LOW (ref 3.5–5.0)
Alkaline Phosphatase: 65 U/L (ref 38–126)
Anion gap: 8 (ref 5–15)
BUN: 7 mg/dL (ref 6–20)
CO2: 27 mmol/L (ref 22–32)
Calcium: 8.2 mg/dL — ABNORMAL LOW (ref 8.9–10.3)
Chloride: 92 mmol/L — ABNORMAL LOW (ref 98–111)
Creatinine, Ser: 0.6 mg/dL — ABNORMAL LOW (ref 0.61–1.24)
GFR, Estimated: >60 mL/min (ref >60)
Glucose, Bld: 88 mg/dL (ref 70–99)
Potassium: 3.8 mmol/L (ref 3.5–5.1)
Sodium: 127 mmol/L — ABNORMAL LOW (ref 135–145)
Total Bilirubin: 0.9 mg/dL (ref 0.0–1.2)
Total Protein: 6.5 g/dL (ref 6.5–8.1)

## 2023-01-20 LAB — MAGNESIUM: Magnesium: 2 mg/dL (ref 1.7–2.4)

## 2023-01-20 MED ORDER — SODIUM CHLORIDE 1 G PO TABS
2.0000 g | ORAL_TABLET | Freq: Three times a day (TID) | ORAL | Status: DC
  Administered 2023-01-20 – 2023-01-21 (×2): 2 g via ORAL
  Filled 2023-01-20 (×5): qty 2

## 2023-01-20 MED ORDER — SODIUM CHLORIDE 1 G PO TABS
1.0000 g | ORAL_TABLET | Freq: Three times a day (TID) | ORAL | Status: DC
  Administered 2023-01-20: 1 g via ORAL
  Filled 2023-01-20 (×2): qty 1

## 2023-01-20 MED ORDER — FUROSEMIDE 10 MG/ML IJ SOLN
20.0000 mg | Freq: Once | INTRAMUSCULAR | Status: AC
  Administered 2023-01-20: 20 mg via INTRAVENOUS
  Filled 2023-01-20: qty 2

## 2023-01-20 NOTE — Plan of Care (Signed)
  Problem: Coping: Goal: Level of anxiety will decrease Outcome: Progressing   Problem: Nutrition: Goal: Adequate nutrition will be maintained Outcome: Adequate for Discharge

## 2023-01-20 NOTE — Progress Notes (Signed)
 BLE venous duplex has been completed.   Results can be found under chart review under CV PROC. 01/20/2023 12:08 PM Shemar Plemmons RVT, RDMS

## 2023-01-20 NOTE — Plan of Care (Signed)
   Problem: Coping: Goal: Level of anxiety will decrease Outcome: Progressing   Problem: Pain Management: Goal: General experience of comfort will improve Outcome: Progressing   Problem: Safety: Goal: Ability to remain free from injury will improve Outcome: Progressing

## 2023-01-20 NOTE — Plan of Care (Signed)
  Problem: Activity: Goal: Risk for activity intolerance will decrease Outcome: Progressing   Problem: Pain Management: Goal: General experience of comfort will improve Outcome: Progressing   Problem: Skin Integrity: Goal: Risk for impaired skin integrity will decrease Outcome: Progressing

## 2023-01-20 NOTE — Progress Notes (Signed)
 [facility] Progress Note Patient: Billy Anderson FMW:[id number] DOB: May 12, 1965 DOA: 01/19/2023  DOS: the patient was seen and examined on 01/20/2023  Brief Hospital Course: Billy Anderson is a 58 y.o. male with Past medical history of alcohol abuse, bipolar disorder, GERD, seizure disorder, psoriasis on Humira, PAF, overactive bladder, chronic hyponatremia. The patient presented to the hospital with complaints of pain and swelling of his legs ongoing for last 4 days.   Currently being treated for cellulitis.  Assessment and plan. Cellulitis of lower extremity Primary reason for patient's presentation. Started on IV antibiotic vancomycin  and ceftriaxone . Will continue with ceftriaxone  for now. Monitor for progression.   Bilateral leg edema. Calf tenderness. Lower extremity Doppler ruled out DVT. Will provide IV Lasix .   Hyponatremia. Acute on chronic. Sodium level at baseline is around 130s. Takes salt tablets on a daily basis. Sodium on admission 118.  Improving Free water restriction.  Continue Likely beer potomania. Patient received IV fluid earlier.  Will continue with sodium tablets.  Increase to 2 g 3 times daily.   Essential hypertension Blood pressure stable.  On Cardizem  at home for A-fib.   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 Cirrhosis appears to be under control. Monitor.   Paroxysmal atrial fibrillation (HCC) CHA2DS2-VASc 1. On aspirin . Will continue. On Cardizem .  Will continue to lower dose.   Bipolar I disorder, most recent episode (or current) manic (HCC) Seizure (HCC) Alcohol abuse Patient is on Lamictal .  200 mg daily.  Patient confirms that he is taking the dose at home and compliant with it. Also on naltrexone  and Zyprexa . Continue Celexa  as well. Continues to drink 6 beers a day.  Recommended to continue.  Clinically patient has a plan to consider rehab outpatient. On CIWA protocol with thiamine  and  folic acid .   GERD. Continue PPI.   Overactive bladder. Ditropan .  Will continue.  Subjective: Feeling better.  Swelling in the leg still present.  Pain resolved.  No nausea no vomiting.  Physical Exam: General: in Mild distress, diffuse lower extremity erythematous rash Cardiovascular: S1 and S2 Present, No Murmur Respiratory: Good respiratory effort, Bilateral Air entry present. No Crackles, No wheezes Abdomen: Bowel Sound present, No tenderness Extremities: Bilateral leg edema Neuro: Alert and oriented x3, no new focal deficit  Data Reviewed: I have Reviewed nursing notes, Vitals, and Lab results. Since last encounter, pertinent lab results CBC and BMP   . I have ordered test including CBC and BMP  .   Disposition: Status is: Inpatient Remains inpatient appropriate because: Monitor for improvement in sodium level.  enoxaparin  (LOVENOX ) injection 40 mg Start: 01/19/23 2200   Family Communication: No one at bedside Level of care: Telemetry   Vitals:   01/20/23 0459 01/20/23 0945 01/20/23 1333 01/20/23 1805  BP: (!) 147/70 128/69 129/65 (!) 143/100  Pulse: 78 77 72 92  Resp: 16 16 16 16   Temp: 98.3 F (36.8 C) 99.4 F (37.4 C) 98.6 F (37 C) 98 F (36.7 C)  TempSrc: Oral     SpO2: 96% 96% 94% 96%  Weight:      Height:         Author: Yetta Blanch, MD 01/20/2023 6:30 PM  Please look on [email or web address] to find out who is on call.

## 2023-01-21 DIAGNOSIS — L03116 Cellulitis of left lower limb: Secondary | ICD-10-CM | POA: Diagnosis not present

## 2023-01-21 DIAGNOSIS — L03115 Cellulitis of right lower limb: Secondary | ICD-10-CM | POA: Diagnosis not present

## 2023-01-21 LAB — RENAL FUNCTION PANEL
Albumin: 2.7 g/dL — ABNORMAL LOW (ref 3.5–5.0)
Anion gap: 7 (ref 5–15)
BUN: 10 mg/dL (ref 6–20)
CO2: 26 mmol/L (ref 22–32)
Calcium: 8 mg/dL — ABNORMAL LOW (ref 8.9–10.3)
Chloride: 95 mmol/L — ABNORMAL LOW (ref 98–111)
Creatinine, Ser: 0.46 mg/dL — ABNORMAL LOW (ref 0.61–1.24)
GFR, Estimated: >60 mL/min (ref >60)
Glucose, Bld: 102 mg/dL — ABNORMAL HIGH (ref 70–99)
Phosphorus: 3.8 mg/dL (ref 2.5–4.6)
Potassium: 4.1 mmol/L (ref 3.5–5.1)
Sodium: 128 mmol/L — ABNORMAL LOW (ref 135–145)

## 2023-01-21 MED ORDER — CEFADROXIL 500 MG PO CAPS: 500.0000 mg | ORAL_CAPSULE | Freq: Two times a day (BID) | ORAL | 0 refills | Status: AC

## 2023-01-21 MED ORDER — SODIUM CHLORIDE 1 G PO TABS: 2.0000 g | ORAL_TABLET | Freq: Three times a day (TID) | ORAL | 0 refills | Status: DC

## 2023-01-21 MED ORDER — DOCUSATE SODIUM 100 MG PO CAPS: 100.0000 mg | ORAL_CAPSULE | Freq: Two times a day (BID) | ORAL | 0 refills | Status: DC

## 2023-01-21 MED ORDER — VITAMIN B-1 100 MG PO TABS: 100.0000 mg | ORAL_TABLET | Freq: Every day | ORAL | 0 refills | Status: DC

## 2023-01-21 MED ORDER — GABAPENTIN 100 MG PO CAPS: 100.0000 mg | ORAL_CAPSULE | Freq: Three times a day (TID) | ORAL | 0 refills | Status: DC

## 2023-01-21 MED ORDER — DILTIAZEM HCL ER COATED BEADS 180 MG PO CP24: 180.0000 mg | ORAL_CAPSULE | Freq: Every day | ORAL | Status: DC

## 2023-01-21 MED ORDER — FOLIC ACID 1 MG PO TABS: 1.0000 mg | ORAL_TABLET | Freq: Every day | ORAL | 0 refills | Status: DC

## 2023-01-21 NOTE — Plan of Care (Signed)
  Problem: Health Behavior/Discharge Planning: Goal: Ability to manage health-related needs will improve Outcome: Progressing   Problem: Activity: Goal: Risk for activity intolerance will decrease Outcome: Progressing   Problem: Nutrition: Goal: Adequate nutrition will be maintained Outcome: Progressing   

## 2023-01-21 NOTE — Progress Notes (Signed)
 Orthopedic Tech Progress Note Patient Details:  Billy Anderson 1965-07-08 [id number]  Ortho Devices Type of Ortho Device: Radio broadcast assistant Ortho Device/Splint Location: bi lat unna applied Ortho Device/Splint Interventions: Ordered, Application, Adjustment   Post Interventions Patient Tolerated: Well Instructions Provided: Adjustment of device, Care of device  Waylan Thom Loving 01/21/2023, 9:54 AM

## 2023-01-21 NOTE — Consult Note (Signed)
 WOC Nurse Consult Note: Reason for Consult: Cellulitis to bilateral lower legs, currently on antibiotic coverage with Rocephin  Q 24 hours.   Is on Humira as well.   Wound type: inflammatory, infectious. Pressure Injury POA: NA Measurement: chronic skin changes with psoriatic plaque to left anterior lower leg  4 x 2.2 cm x 0.2 cm with silvery edges Bilateral dorsal feet with erythema, improving.  Generalized edema to lower legs and feet and chronic skin changes.   Wound azi:Mliib red Drainage (amount, consistency, odor) moderate weeping Periwound: edema, erythema Dressing procedure/placement/frequency: Cleanse bilateral lower legs with soap and water and apply aquacel to weeping lesions (LAWSON # [id number]) Unna boot to bilateral lower legs.  Change twice weekly.  Will not follow at this time.  Please re-consult if needed.  Darice Cooley MSN, RN, FNP-BC CWON Wound, Ostomy, Continence Nurse Outpatient [facility] [phone] Pager [phone]

## 2023-01-21 NOTE — Discharge Summary (Signed)
 Physician Discharge Summary   Patient: Billy Anderson MRN: [id number] DOB: [birth year]  Admit date:     01/19/2023  Discharge date: 01/21/2023  Discharge Physician: Yetta Blanch  PCP: Paseda, Folashade R, FNP  Recommendations at discharge: Follow-up with PCP for a wound check. Home health ordered.   Follow-up Information     Paseda, Folashade R, FNP. Schedule an appointment as soon as possible for a visit in 1 week(s).   Specialty: Nurse Practitioner Why: For wound re-check, with BMP lab to look at kidney and electrolytes Contact information: [address] [place] [address] [phone]         [facility] Follow up.   Specialty: Home Health Services Why: to provide home health nursing visits Contact information: [address] [place] [address] [phone]                Discharge Diagnoses: Principal Problem:   Bilateral cellulitis of lower leg Active Problems:   Essential hypertension   Hyponatremia   Psoriatic arthritis (HCC)   Paroxysmal atrial fibrillation (HCC)   Bipolar I disorder, most recent episode (or current) manic (HCC)   Lumbar spinal stenosis   Seizure (HCC)   Alcohol abuse  Hospital Course: Conard Alvira III is a 58 y.o. male with Past medical history of alcohol abuse, bipolar disorder, GERD, seizure disorder, psoriasis on Humira, PAF, overactive bladder, chronic hyponatremia. The patient presented to the hospital with complaints of pain and swelling of his legs ongoing for last 4 days.   Currently being treated for cellulitis.   Assessment and plan. Cellulitis of lower extremity Primary reason for patient's presentation. Started on IV antibiotic vancomycin  and ceftriaxone . Then switched to ceftriaxone .  Now on oral antibiotic.   Bilateral leg edema. Calf tenderness. Lower extremity Doppler ruled out DVT.   Hyponatremia. Acute on chronic. Sodium level at baseline is around  130s. Takes salt tablets on a daily basis. Sodium on admission 118.  Improving Free water restriction.  Continue Likely beer potomania. Patient received IV fluid earlier.  Will continue with sodium tablets.  Increase to 2 g 3 times daily.   Essential hypertension Blood pressure stable.  On Cardizem  at home for A-fib.   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 Cirrhosis appears to be under control. Monitor.   Paroxysmal atrial fibrillation (HCC) CHA2DS2-VASc 1. On aspirin . Will continue. On Cardizem .  Will continue to lower dose.   Bipolar I disorder, most recent episode (or current) manic (HCC) Seizure (HCC) Alcohol abuse Patient is on Lamictal .  200 mg daily.  Patient confirms that he is taking the dose at home and compliant with it. Also on naltrexone  and Zyprexa . Continue Celexa  as well. Continues to drink 6 beers a day.  Recommended to continue.  Clinically patient has a plan to consider rehab outpatient. On CIWA protocol with thiamine  and folic acid .   GERD. Continue PPI.   Overactive bladder. Ditropan .  Will continue.  Consultants:  none  Procedures performed:  none  DISCHARGE MEDICATION: Allergies as of 01/21/2023       Reactions   Ativan  [lorazepam ] Other (See Comments)   Confusion  Hallucinations    Roxicodone [oxycodone] Itching   Ultram [tramadol] Other (See Comments)   Seizures    Vistaril [hydroxyzine] Itching, Other (See Comments)   Sedation Not effective in reducing anxiety        Medication List     PAUSE taking these medications    Humira (  2 Pen) 40 MG/0.8ML Ajkt pen Wait to take this until: January 28, 2023 Generic drug: adalimumab Inject 0.8 mLs into the skin every 14 (fourteen) days.       STOP taking these medications    omeprazole 20 MG capsule Commonly known as: PRILOSEC   thiamine  100 MG tablet Commonly known as: VITAMIN B1   tiZANidine  4 MG tablet Commonly known as: ZANAFLEX         TAKE these medications    ALLEGRA PO Take 180 mg by mouth daily.   Aspirin  Low Dose 81 MG tablet Generic drug: aspirin  EC Take 1 tablet (81 mg total) by mouth daily.   CALCIUM  600+D3 PO Take 1 tablet by mouth 2 (two) times daily.   cefadroxil  500 MG capsule Commonly known as: DURICEF Take 1 capsule (500 mg total) by mouth 2 (two) times daily for 5 days.   citalopram  10 MG tablet Commonly known as: CELEXA  Take 1 tablet (10 mg total) by mouth at bedtime.   diltiazem  180 MG 24 hr capsule Commonly known as: CARDIZEM  CD Take 1 capsule (180 mg total) by mouth daily. What changed: how much to take   docusate sodium  100 MG capsule Commonly known as: COLACE Take 1 capsule (100 mg total) by mouth 2 (two) times daily.   folic acid  1 MG tablet Commonly known as: FOLVITE  Take 1 tablet (1 mg total) by mouth daily. Start taking on: January 22, 2023   gabapentin  100 MG capsule Commonly known as: Neurontin  Take 1 capsule (100 mg total) by mouth 3 (three) times daily for 10 days.   lamoTRIgine  100 MG tablet Commonly known as: LAMICTAL  Take 2 tablets (200 mg total) by mouth at bedtime.   multivitamin tablet Take 1 tablet by mouth daily. What changed: when to take this   naltrexone  50 MG tablet Commonly known as: DEPADE Take 1 tablet (50 mg total) by mouth daily. What changed: when to take this   OLANZapine  20 MG tablet Commonly known as: ZYPREXA  Take 1 tablet (20 mg total) by mouth at bedtime.   oxybutynin  10 MG 24 hr tablet Commonly known as: DITROPAN -XL TAKE 1 TABLET BY MOUTH AT BEDTIME   pantoprazole  40 MG tablet Commonly known as: PROTONIX  TAKE 1 TABLET BY MOUTH DAILY   pyridOXINE  100 MG tablet Commonly known as: VITAMIN B6 Take 1 tablet (100 mg total) by mouth daily. What changed: when to take this   sodium chloride  1 g tablet Take 2 tablets (2 g total) by mouth 3 (three) times daily with meals. What changed: how much to take   thiamine  100 MG  tablet Commonly known as: Vitamin B-1 Take 1 tablet (100 mg total) by mouth daily. Start taking on: January 22, 2023               Discharge Care Instructions  (From admission, onward)           Start     Ordered   01/21/23 0000  Discharge wound care:       Comments: Cleanse bilateral lower legs with soap and water and apply aquacel to weeping lesions Unna boot to bilateral lower legs.  Change twice weekly   01/21/23 1028           Disposition: Home Diet recommendation: Regular diet  Discharge Exam: Vitals:   01/20/23 1805 01/20/23 2222 01/21/23 0530 01/21/23 1324  BP: (!) 143/100 (!) 144/72 117/69 (!) 146/81  Pulse: 92 77 70 60  Resp: 16 17 17  15  Temp: 98 F (36.7 C) 99.3 F (37.4 C) 98.8 F (37.1 C) 98.1 F (36.7 C)  TempSrc:  Oral Oral   SpO2: 96% 94% 96% 95%  Weight:      Height:       General: Appear in no distress; no visible Abnormal Neck Mass Or lumps, Conjunctiva normal Cardiovascular: S1 and S2 Present, no Murmur, Respiratory: good respiratory effort, Bilateral Air entry present and CTA, no Crackles, no wheezes Abdomen: Bowel Sound present, Non tender  Extremities: bilateral Pedal edema Neurology: alert and oriented to time, place, and person  Filed Weights   01/19/23 1825  Weight: 83.9 kg   Condition at discharge: stable  The results of significant diagnostics from this hospitalization (including imaging, microbiology, ancillary and laboratory) are listed below for reference.   Imaging Studies: VAS US  LOWER EXTREMITY VENOUS (DVT) Result Date: 01/20/2023  Lower Venous DVT Study Patient Name:  Kmarion Rawl III  Date of Exam:   01/20/2023 Medical Rec #: [id number]         Accession #:    [id number] Date of Birth: 06/19/1965         Patient Gender: M Patient Age:   60 years Exam Location:  [facility] Procedure:      VAS US  LOWER EXTREMITY VENOUS (DVT) Referring Phys: Tyniah Kastens  --------------------------------------------------------------------------------  Indications: Edema, and Erythema. Other Indications: Cellulitis. Limitations: Poor ultrasound/tissue interface and patient unable to obtain proper position due to back pain. Comparison Study: Previous exam on 01/03/2020 (@[facility] ) was negative for DVT Performing Technologist: Ezzie Potters RVT, RDMS  Examination Guidelines: A complete evaluation includes B-mode imaging, spectral Doppler, color Doppler, and power Doppler as needed of all accessible portions of each vessel. Bilateral testing is considered an integral part of a complete examination. Limited examinations for reoccurring indications may be performed as noted. The reflux portion of the exam is performed with the patient in reverse Trendelenburg.  +---------+---------------+---------+-----------+----------+-------------------+ RIGHT    CompressibilityPhasicitySpontaneityPropertiesThrombus Aging      +---------+---------------+---------+-----------+----------+-------------------+ CFV      Full           Yes      Yes                                      +---------+---------------+---------+-----------+----------+-------------------+ SFJ      Full                                                             +---------+---------------+---------+-----------+----------+-------------------+ FV Prox  Full           Yes      Yes                                      +---------+---------------+---------+-----------+----------+-------------------+ FV Mid   Full           Yes      Yes                                      +---------+---------------+---------+-----------+----------+-------------------+ FV DistalFull  Yes      Yes                                      +---------+---------------+---------+-----------+----------+-------------------+ PFV      Full                                                              +---------+---------------+---------+-----------+----------+-------------------+ POP      Full           Yes      Yes                                      +---------+---------------+---------+-----------+----------+-------------------+ PTV      Full                                                             +---------+---------------+---------+-----------+----------+-------------------+ PERO                                                  Not well visualized +---------+---------------+---------+-----------+----------+-------------------+   +---------+---------------+---------+-----------+----------+--------------+ LEFT     CompressibilityPhasicitySpontaneityPropertiesThrombus Aging +---------+---------------+---------+-----------+----------+--------------+ CFV      Full           Yes      Yes                                 +---------+---------------+---------+-----------+----------+--------------+ SFJ      Full                                                        +---------+---------------+---------+-----------+----------+--------------+ FV Prox  Full           Yes      Yes                                 +---------+---------------+---------+-----------+----------+--------------+ FV Mid   Full           Yes      Yes                                 +---------+---------------+---------+-----------+----------+--------------+ FV DistalFull           Yes      Yes                                 +---------+---------------+---------+-----------+----------+--------------+ PFV      Full                                                        +---------+---------------+---------+-----------+----------+--------------+  POP      Full           Yes      Yes                                 +---------+---------------+---------+-----------+----------+--------------+ PTV      Full                                                         +---------+---------------+---------+-----------+----------+--------------+ PERO     Full                                                        +---------+---------------+---------+-----------+----------+--------------+     Summary: BILATERAL: - No evidence of deep vein thrombosis seen in the lower extremities, bilaterally. -No evidence of popliteal cyst, bilaterally. - Subcutaneous edema of calf and ankle, bilaterally.  LEFT: - Ultrasound characteristics of enlarged lymph nodes noted in the groin.  *See table(s) above for measurements and observations. Electronically signed by Penne Colorado MD on 01/20/2023 at 5:35:48 PM.    Final    DG Chest Port 1 View Result Date: 01/19/2023 CLINICAL DATA:  Shortness of breath. EXAM: PORTABLE CHEST 1 VIEW COMPARISON:  None Available. FINDINGS: Low lung volumes. Cardiopericardial silhouette is at upper limits of normal for size. There is pulmonary vascular congestion without overt pulmonary edema. No focal consolidation, pneumothorax, or evidence of pleural effusion. No acute bony abnormality. Telemetry leads overlie the chest. IMPRESSION: Low volume film with pulmonary vascular congestion. Electronically Signed   By: Camellia Candle M.D.   On: 01/19/2023 05:47    Microbiology: Results for orders placed or performed during the hospital encounter of 01/19/23  Blood culture (routine x 2)     Status: None (Preliminary result)   Collection Time: 01/19/23  3:49 AM   Specimen: BLOOD LEFT HAND  Result Value Ref Range Status   Specimen Description   Final    BLOOD LEFT HAND Performed at [facility], [address], [place], [place] [address]    Special Requests   Final    BOTTLES DRAWN AEROBIC AND ANAEROBIC Blood Culture results may not be optimal due to an inadequate volume of blood received in culture bottles Performed at [facility], [address] [place], [place] [address]    Culture   Final    NO GROWTH 2  DAYS Performed at [facility], [address], [place], [place] [address]    Report Status PENDING  Incomplete  Blood culture (routine x 2)     Status: None (Preliminary result)   Collection Time: 01/19/23  3:50 AM   Specimen: BLOOD RIGHT ARM  Result Value Ref Range Status   Specimen Description   Final    BLOOD RIGHT ARM Performed at [facility], [address], [place], [place] [address]    Special Requests   Final    BOTTLES DRAWN AEROBIC AND ANAEROBIC Blood Culture results may not be optimal due to an inadequate volume of blood received in culture bottles Performed at [facility],  [address], [place], [place] [address]    Culture   Final    NO GROWTH 2 DAYS Performed at [facility], [address] [place], [place] [address]    Report Status PENDING  Incomplete   Labs: CBC: Recent Labs  Lab 01/19/23 0243 01/19/23 0253 01/20/23 0821  WBC 13.6*  --  8.3  NEUTROABS 10.7*  --  5.0  HGB 15.8 16.7 14.5  HCT 43.8 49.0 40.2  MCV 98.9  --  100.8*  PLT 257  --  246   Basic Metabolic Panel: Recent Labs  Lab 01/19/23 0243 01/19/23 0253 01/19/23 0939 01/20/23 0821 01/21/23 0828  NA 118* 123* 124* 127* 128*  K 3.8 4.1 4.4 3.8 4.1  CL 84* 87* 92* 92* 95*  CO2 24  --  24 27 26   GLUCOSE 87 80 137* 88 102*  BUN 8 6 8 7 10   CREATININE 0.53* 0.60* 0.56* 0.60* 0.46*  CALCIUM  8.5*  --  8.2* 8.2* 8.0*  MG  --   --  1.8 2.0  --   PHOS  --   --  2.7  --  3.8   Liver Function Tests: Recent Labs  Lab 01/19/23 0939 01/20/23 0821 01/21/23 0828  AST 41 25  --   ALT 15 15  --   ALKPHOS 76 65  --   BILITOT 1.4* 0.9  --   PROT 6.7 6.5  --   ALBUMIN 2.9* 2.8* 2.7*   CBG: No results for input(s): GLUCAP in the last 168 hours.  Discharge time spent: greater than 30 minutes.  Author: Yetta Blanch, MD  [facility] 01/21/2023

## 2023-01-21 NOTE — TOC Transition Note (Signed)
 Transition of Care [facility]) - Discharge Note   Patient Details  Name: Billy Anderson MRN: [id number] Date of Birth: 1965-01-24  Transition [facility]) CM/SW Contact:  NORMAN ASPEN, LCSW Phone Number: 01/21/2023, 11:09 AM   Clinical Narrative:     Met with pt today to review TOC consult to assist with [facility] arrangements and follow up on SA concerns.  Pt very pleasant and agreeable with HHRN follow up and has no HH agency preference.  Able to secure coverage with [facility].  With pt agreement, CSW contacted his s/o, Ms. Bisbee, who is aware that [facility] will want to have confirmed caregiver and provide education on wound care.  She is very agreeable and notes that she has had two family members in the past who required Unna boot care.  Re: SA concerns, pt denies any concerns himself about his ETOH intake and decline CSW placing any resource information on AVS.   No further TOC needs.  Final next level of care: Home w Home Health Services Barriers to Discharge: No Barriers Identified   Patient Goals and CMS Choice Patient states their goals for this hospitalization and ongoing recovery are:: return home          Discharge Placement                       Discharge Plan and Services Additional resources added to the After Visit Summary for                  DME Arranged: N/A DME Agency: NA       HH Arranged: RN HH Agency: [facility] Date [facility] Agency Contacted: 01/21/23 Time HH Agency Contacted: [id number] Representative spoke with at [facility] Agency: Cindie  Social Drivers of Health (SDOH) Interventions SDOH Screenings   Food Insecurity: No Food Insecurity (01/19/2023)  Housing: High Risk (01/19/2023)  Transportation Needs: No Transportation Needs (01/19/2023)  Utilities: Not At Risk (01/19/2023)  Depression (PHQ2-9): Low Risk  (12/24/2021)  Financial Resource Strain: Medium Risk (07/24/2020)   Received from [facility]  Tobacco Use: High Risk (01/19/2023)  Health  Literacy: Low Risk  (10/02/2020)   Received from [facility]     Readmission Risk Interventions    01/21/2023   11:08 AM  Readmission Risk Prevention Plan  Transportation Screening Complete  PCP or Specialist Appt within 5-7 Days Complete  Home Care Screening Complete  Medication Review (RN CM) Complete

## 2023-01-24 LAB — CULTURE, BLOOD (ROUTINE X 2)
Culture: NO GROWTH
Culture: NO GROWTH

## 2023-01-25 DIAGNOSIS — I10 Essential (primary) hypertension: Secondary | ICD-10-CM | POA: Diagnosis not present

## 2023-01-25 DIAGNOSIS — L03116 Cellulitis of left lower limb: Secondary | ICD-10-CM | POA: Diagnosis not present

## 2023-01-25 DIAGNOSIS — R238 Other skin changes: Secondary | ICD-10-CM | POA: Diagnosis not present

## 2023-01-25 DIAGNOSIS — N3281 Overactive bladder: Secondary | ICD-10-CM | POA: Diagnosis not present

## 2023-01-25 DIAGNOSIS — I480 Paroxysmal atrial fibrillation: Secondary | ICD-10-CM | POA: Diagnosis not present

## 2023-01-25 DIAGNOSIS — E871 Hypo-osmolality and hyponatremia: Secondary | ICD-10-CM | POA: Diagnosis not present

## 2023-01-25 DIAGNOSIS — G40909 Epilepsy, unspecified, not intractable, without status epilepticus: Secondary | ICD-10-CM | POA: Diagnosis not present

## 2023-01-25 DIAGNOSIS — F319 Bipolar disorder, unspecified: Secondary | ICD-10-CM | POA: Diagnosis not present

## 2023-01-25 DIAGNOSIS — L03115 Cellulitis of right lower limb: Secondary | ICD-10-CM | POA: Diagnosis not present

## 2023-01-26 ENCOUNTER — Ambulatory Visit (INDEPENDENT_AMBULATORY_CARE_PROVIDER_SITE_OTHER): Payer: Self-pay | Admitting: Nurse Practitioner

## 2023-01-26 ENCOUNTER — Encounter: Payer: Self-pay | Admitting: Nurse Practitioner

## 2023-01-26 VITALS — BP 110/64 | HR 70 | Temp 97.0°F | Wt 188.0 lb

## 2023-01-26 DIAGNOSIS — L03116 Cellulitis of left lower limb: Secondary | ICD-10-CM | POA: Diagnosis not present

## 2023-01-26 DIAGNOSIS — Z1211 Encounter for screening for malignant neoplasm of colon: Secondary | ICD-10-CM

## 2023-01-26 DIAGNOSIS — F3110 Bipolar disorder, current episode manic without psychotic features, unspecified: Secondary | ICD-10-CM

## 2023-01-26 DIAGNOSIS — N3281 Overactive bladder: Secondary | ICD-10-CM | POA: Diagnosis not present

## 2023-01-26 DIAGNOSIS — Z09 Encounter for follow-up examination after completed treatment for conditions other than malignant neoplasm: Secondary | ICD-10-CM

## 2023-01-26 DIAGNOSIS — I480 Paroxysmal atrial fibrillation: Secondary | ICD-10-CM

## 2023-01-26 DIAGNOSIS — Z23 Encounter for immunization: Secondary | ICD-10-CM

## 2023-01-26 DIAGNOSIS — L03115 Cellulitis of right lower limb: Secondary | ICD-10-CM

## 2023-01-26 DIAGNOSIS — F17200 Nicotine dependence, unspecified, uncomplicated: Secondary | ICD-10-CM

## 2023-01-26 DIAGNOSIS — Q845 Enlarged and hypertrophic nails: Secondary | ICD-10-CM

## 2023-01-26 DIAGNOSIS — E871 Hypo-osmolality and hyponatremia: Secondary | ICD-10-CM | POA: Diagnosis not present

## 2023-01-26 DIAGNOSIS — F1010 Alcohol abuse, uncomplicated: Secondary | ICD-10-CM

## 2023-01-26 DIAGNOSIS — Z122 Encounter for screening for malignant neoplasm of respiratory organs: Secondary | ICD-10-CM

## 2023-01-26 DIAGNOSIS — M48061 Spinal stenosis, lumbar region without neurogenic claudication: Secondary | ICD-10-CM

## 2023-01-26 DIAGNOSIS — I10 Essential (primary) hypertension: Secondary | ICD-10-CM

## 2023-01-26 MED ORDER — SODIUM CHLORIDE 1 G PO TABS: 2.0000 g | ORAL_TABLET | Freq: Three times a day (TID) | ORAL | 3 refills | Status: DC

## 2023-01-26 MED ORDER — OXYBUTYNIN CHLORIDE ER 10 MG PO TB24: 10.0000 mg | ORAL_TABLET | Freq: Every day | ORAL | 0 refills | Status: DC

## 2023-01-26 MED ORDER — DILTIAZEM HCL ER COATED BEADS 180 MG PO CP24: 180.0000 mg | ORAL_CAPSULE | Freq: Every day | ORAL | 1 refills | Status: DC

## 2023-01-26 MED ORDER — FOLIC ACID 1 MG PO TABS: 1.0000 mg | ORAL_TABLET | Freq: Every day | ORAL | 0 refills | Status: DC

## 2023-01-26 MED ORDER — GABAPENTIN 100 MG PO CAPS: 100.0000 mg | ORAL_CAPSULE | Freq: Two times a day (BID) | ORAL | 1 refills | Status: DC

## 2023-01-26 NOTE — Progress Notes (Signed)
 Established Patient Office Visit  Subjective:  Patient ID: Billy Anderson, male    DOB: 1965/09/04  Age: 58 y.o. MRN: [id number]  CC:  Chief Complaint  Patient presents with   Hospitalization Follow-up    HPI Billy Anderson is a 58 y.o. male  has a past medical history of Arthritis, Bipolar disorder (HCC), Dysrhythmia, GERD (gastroesophageal reflux disease), Hypertension, Pneumonia, and Seizures (HCC). Patient presents for hospital discharge follow-up.  Patient was on admission at the hospital from 01/19/2023 to 01/21/2023 for bilateral cellulitis of lower leg.  He received IV antibiotics vancomycin  and ceftriaxone  at the hospital he was discharged home on cefadroxil  500 mg twice daily which he has completed today.  He currently denies fever, pain.  Has been taking gabapentin  100 mg 3 times daily.  Has dressing to both lower extremities, instruction is to have dressing changes done twice weekly, by cleansing bilateral lower extremities with soap and water and apply Aquacel to weeping lesions Unna boots to bilateral lower extremities ,stated that the home health nurse was with them this week and she plans returning on Friday.  He has been following up regularly with a rheumatologist and psychiatrist.  He also follows up with orthopedics for pain in cervical spine  Continues to smoke 1 pack of cigarettes daily, we discussed referral for lung cancer screening.  Patient is due for colon cancer screening, we discussed referral to the GI today  Patient is accompanied by his spouse  Past Medical History:  Diagnosis Date   Arthritis    psoriatic arthritis   Bipolar disorder (HCC)    Dysrhythmia    PAF   GERD (gastroesophageal reflux disease)    Hypertension    Pneumonia    Seizures (HCC)     Past Surgical History:  Procedure Laterality Date   ANTERIOR CERVICAL DECOMP/DISCECTOMY FUSION N/A 03/20/2022   Procedure: Cervical Three-Four, Cervical Four-Five Anterior Cervical Discectomy  Fusion;  Surgeon: Gillie Duncans, MD;  Location: [facility] OR;  Service: Neurosurgery;  Laterality: N/A;  RM 20 to follow 3C   HAND SURGERY Left    carpal tunnel release    Family History  Problem Relation Age of Onset   Bipolar disorder Mother     Social History   Socioeconomic History   Marital status: Married    Spouse name: Not on file   Number of children: 3   Years of education: Not on file   Highest education level: Not on file  Occupational History   Not on file  Tobacco Use   Smoking status: Every Day    Current packs/day: 1.00    Types: Cigarettes   Smokeless tobacco: Not on file  Vaping Use   Vaping status: Some Days  Substance and Sexual Activity   Alcohol use: Not Currently    Alcohol/week: 48.0 standard drinks of alcohol    Types: 48 Shots of liquor per week    Comment: 1 pint a day   Drug use: Never   Sexual activity: Not Currently  Other Topics Concern   Not on file  Social History Narrative   Not on file   Social Drivers of Health   Financial Resource Strain: Medium Risk (07/24/2020)   [facility]   Overall Financial Resource Strain (CARDIA)    Difficulty of Paying Living Expenses: Somewhat hard  Food Insecurity: No Food Insecurity (01/19/2023)   Hunger Vital Sign    Worried About Running Out of Food in the Last Year: Never true  Ran Out of Food in the Last Year: Never true  Transportation Needs: No Transportation Needs (01/19/2023)   PRAPARE - Administrator, Civil Service (Medical): No    Lack of Transportation (Non-Medical): No  Physical Activity: Not on file  Stress: Not on file  Social Connections: Not on file  Intimate Partner Violence: Not At Risk (01/19/2023)   Humiliation, Afraid, Rape, and Kick questionnaire    Fear of Current or Ex-Partner: No    Emotionally Abused: No    Physically Abused: No    Sexually Abused: No    Outpatient Medications Prior to Visit  Medication Sig Dispense Refill   aspirin  EC  81 MG tablet Take 1 tablet (81 mg total) by mouth daily. 30 tablet 0   Calcium  Carb-Cholecalciferol  (CALCIUM  600+D3 PO) Take 1 tablet by mouth 2 (two) times daily.     citalopram  (CELEXA ) 10 MG tablet Take 1 tablet (10 mg total) by mouth at bedtime. 90 tablet 0   docusate sodium  (COLACE) 100 MG capsule Take 1 capsule (100 mg total) by mouth 2 (two) times daily. 10 capsule 0   Fexofenadine HCl (ALLEGRA PO) Take 180 mg by mouth daily.     lamoTRIgine  (LAMICTAL ) 100 MG tablet Take 2 tablets (200 mg total) by mouth at bedtime. 60 tablet 11   Multiple Vitamin (MULTIVITAMIN) tablet Take 1 tablet by mouth daily. (Patient taking differently: Take 1 tablet by mouth in the morning and at bedtime.)     naltrexone  (DEPADE) 50 MG tablet Take 1 tablet (50 mg total) by mouth daily. (Patient taking differently: Take 50 mg by mouth at bedtime.) 90 tablet 0   OLANZapine  (ZYPREXA ) 20 MG tablet Take 1 tablet (20 mg total) by mouth at bedtime. 90 tablet 0   pantoprazole  (PROTONIX ) 40 MG tablet TAKE 1 TABLET BY MOUTH DAILY 30 tablet 3   pyridOXINE  (VITAMIN B6) 100 MG tablet Take 1 tablet (100 mg total) by mouth daily. (Patient taking differently: Take 100 mg by mouth in the morning and at bedtime.) 30 tablet 0   thiamine  (VITAMIN B-1) 100 MG tablet Take 1 tablet (100 mg total) by mouth daily. 30 tablet 0   diltiazem  (CARDIZEM  CD) 180 MG 24 hr capsule Take 1 capsule (180 mg total) by mouth daily.     folic acid  (FOLVITE ) 1 MG tablet Take 1 tablet (1 mg total) by mouth daily. 30 tablet 0   gabapentin  (NEURONTIN ) 100 MG capsule Take 1 capsule (100 mg total) by mouth 3 (three) times daily for 10 days. 30 capsule 0   oxybutynin  (DITROPAN -XL) 10 MG 24 hr tablet TAKE 1 TABLET BY MOUTH AT BEDTIME 90 tablet 0   sodium chloride  1 g tablet Take 2 tablets (2 g total) by mouth 3 (three) times daily with meals. 60 tablet 0   cefadroxil  (DURICEF) 500 MG capsule Take 1 capsule (500 mg total) by mouth 2 (two) times daily for 5 days.  (Patient not taking: Reported on 01/26/2023) 10 capsule 0   HUMIRA, 2 PEN, 40 MG/0.8ML AJKT pen Inject 0.8 mLs into the skin every 14 (fourteen) days. (Patient not taking: Reported on 01/26/2023)     No facility-administered medications prior to visit.    Allergies  Allergen Reactions   Ativan  [Lorazepam ] Other (See Comments)    Confusion  Hallucinations    Roxicodone [Oxycodone] Itching   Ultram [Tramadol] Other (See Comments)    Seizures    Vistaril [Hydroxyzine] Itching and Other (See Comments)    Sedation  Not effective in reducing anxiety    ROS Review of Systems    Objective:    Physical Exam Vitals and nursing note reviewed.  Constitutional:      General: He is not in acute distress.    Appearance: Normal appearance. He is obese. He is not ill-appearing, toxic-appearing or diaphoretic.  HENT:     Mouth/Throat:     Mouth: Mucous membranes are moist.     Pharynx: Oropharynx is clear. No oropharyngeal exudate or posterior oropharyngeal erythema.  Eyes:     General: No scleral icterus.       Right eye: No discharge.        Left eye: No discharge.     Extraocular Movements: Extraocular movements intact.     Conjunctiva/sclera: Conjunctivae normal.  Cardiovascular:     Rate and Rhythm: Normal rate and regular rhythm.     Pulses: Normal pulses.     Heart sounds: Normal heart sounds. No murmur heard.    No friction rub. No gallop.  Pulmonary:     Effort: Pulmonary effort is normal. No respiratory distress.     Breath sounds: Normal breath sounds. No stridor. No wheezing, rhonchi or rales.  Chest:     Chest wall: No tenderness.  Abdominal:     General: There is no distension.     Palpations: Abdomen is soft.     Tenderness: There is no abdominal tenderness. There is no right CVA tenderness, left CVA tenderness or guarding.  Musculoskeletal:        General: No swelling, deformity or signs of injury.     Right lower leg: No edema.     Left lower leg: No edema.      Comments: has Unna boots on bilateral lower extremities.  Bilateral lower extremities appears pink in color.  No swelling or redness noted, has elongated hypertrophic nails bilaterally Has decreased range of motion of the spine, using a walker for ambulation  Skin:    General: Skin is warm and dry.     Coloration: Skin is not jaundiced or pale.     Findings: No bruising or erythema.  Neurological:     Mental Status: He is alert and oriented to person, place, and time.     Motor: No weakness.     Coordination: Coordination normal.     Gait: Gait abnormal.  Psychiatric:        Mood and Affect: Mood normal.        Behavior: Behavior normal.        Thought Content: Thought content normal.        Judgment: Judgment normal.     BP 110/64   Pulse 70   Temp (!) 97 F (36.1 C)   Wt 188 lb (85.3 kg)   SpO2 97%   BMI 25.50 kg/m  Wt Readings from Last 3 Encounters:  01/26/23 188 lb (85.3 kg)  01/19/23 185 lb (83.9 kg)  11/11/22 188 lb (85.3 kg)    Lab Results  Component Value Date   TSH 0.860 10/16/2021   Lab Results  Component Value Date   WBC 8.3 01/20/2023   HGB 14.5 01/20/2023   HCT 40.2 01/20/2023   MCV 100.8 (H) 01/20/2023   PLT 246 01/20/2023   Lab Results  Component Value Date   NA 128 (L) 01/21/2023   K 4.1 01/21/2023   CO2 26 01/21/2023   GLUCOSE 102 (H) 01/21/2023   BUN 10 01/21/2023   CREATININE 0.46 (L) 01/21/2023  BILITOT 0.9 01/20/2023   ALKPHOS 65 01/20/2023   AST 25 01/20/2023   ALT 15 01/20/2023   PROT 6.5 01/20/2023   ALBUMIN 2.7 (L) 01/21/2023   CALCIUM  8.0 (L) 01/21/2023   ANIONGAP 7 01/21/2023   EGFR 101 01/05/2022   No results found for: CHOL No results found for: HDL No results found for: LDLCALC No results found for: TRIG No results found for: CHOLHDL No results found for: YHAJ8R    Assessment & Plan:   Problem List Items Addressed This Visit       Cardiovascular and Mediastinum   Essential hypertension   BP  Readings from Last 3 Encounters:  01/26/23 110/64  01/21/23 (!) 146/81  03/21/22 124/74  Controlled on Cardizem  180 mg daily Continue current medication        Relevant Medications   diltiazem  (CARDIZEM  CD) 180 MG 24 hr capsule   Other Relevant Orders   Basic Metabolic Panel   Paroxysmal atrial fibrillation (HCC)   Continue Cardizem  180 mg daily, aspirin  81 mg daily      Relevant Medications   diltiazem  (CARDIZEM  CD) 180 MG 24 hr capsule     Musculoskeletal and Integument   Enlarged and hypertrophic nails   Has upcoming appointment with podiatry for nail care tomorrow        Genitourinary   Overactive bladder   Continue oxybutynin  10 mg daily      Relevant Medications   oxybutynin  (DITROPAN -XL) 10 MG 24 hr tablet     Other   Hyponatremia   Chronic medical condition Currently on sodium chloride  2 g 3 times daily.  Stated that he has been taking 1 g instead of 2 g Rechecking BMP today Encouraged to avoid alcohol Lab Results  Component Value Date   NA 128 (L) 01/21/2023   K 4.1 01/21/2023   CO2 26 01/21/2023   GLUCOSE 102 (H) 01/21/2023   BUN 10 01/21/2023   CREATININE 0.46 (L) 01/21/2023   CALCIUM  8.0 (L) 01/21/2023   EGFR 101 01/05/2022   GFRNONAA >60 01/21/2023         Relevant Medications   sodium chloride  1 g tablet   Other Relevant Orders   Basic Metabolic Panel   Bipolar I disorder, most recent episode (or current) manic (HCC)   Continue Lamictal  200 mg daily, olanzapine  20 mg daily, Celexa  10 mg daily Patient encouraged to maintain close follow-up with psychiatrist      Tobacco use disorder   Smokes about 1 pack/day  Asked about quitting: confirms that he/she currently smokes cigarettes Advise to quit smoking: Educated about QUITTING to reduce the risk of cancer, cardio and cerebrovascular disease. Assess willingness: Unwilling to quit at this time, but is working on cutting back. Assist with counseling and pharmacotherapy: Counseled for 5  minutes and literature provided. Arrange for follow up: follow up in 4 months and continue to offer help.  Low-dose chest CT scan ordered to screen for lung cancer      Lumbar spinal stenosis   Uses a walker for ambulation Followed by orthopedics Continue gabapentin  100 mg twice daily      Hospital discharge follow-up   Hospital chart reviewed, including discharge summary Medications reconciled and reviewed with the patient in detail       Bilateral cellulitis of lower leg - Primary   Unna boots noted on bilateral lower extremity Dressing is clean and dry Has hypertrophic long nails bilaterally, stated that he has an upcoming appointment with podiatry tomorrow Continue wound  dressing at home, home health RN will be coming to the home this week for wound care      Alcohol abuse   Drinks alcohol occasionally, need to quit drinking alcohol discussed Takes naltrexone  50 mg daily      Need for influenza vaccination   Patient educated on CDC recommendation for the vaccine. Verbal consent was obtained from the patient, vaccine administered by nurse, no sign of adverse reactions noted at this time. Patient education on arm soreness and use of tylenol  or for this patient  was discussed. Patient educated on the signs and symptoms of adverse effect and advise to contact the office if they occur. Vaccine information sheet given to patient.        Relevant Orders   Flu vaccine trivalent PF, 6mos and older(Flulaval,Afluria,Fluarix,Fluzone) (Completed)   Other Visit Diagnoses       Screening for colon cancer       Relevant Orders   Ambulatory referral to Gastroenterology     Screening for lung cancer       Relevant Orders   CT CHEST LUNG CA SCREEN LOW DOSE W/O CM       Meds ordered this encounter  Medications   folic acid  (FOLVITE ) 1 MG tablet    Sig: Take 1 tablet (1 mg total) by mouth daily.    Dispense:  90 tablet    Refill:  0   gabapentin  (NEURONTIN ) 100 MG capsule    Sig:  Take 1 capsule (100 mg total) by mouth 2 (two) times daily for 10 days.    Dispense:  180 capsule    Refill:  1   sodium chloride  1 g tablet    Sig: Take 2 tablets (2 g total) by mouth 3 (three) times daily with meals.    Dispense:  60 tablet    Refill:  3   oxybutynin  (DITROPAN -XL) 10 MG 24 hr tablet    Sig: Take 1 tablet (10 mg total) by mouth at bedtime.    Dispense:  90 tablet    Refill:  0   diltiazem  (CARDIZEM  CD) 180 MG 24 hr capsule    Sig: Take 1 capsule (180 mg total) by mouth daily.    Dispense:  90 capsule    Refill:  1    Follow-up: Return in about 4 months (around 05/26/2023) for HTN.    Roxanne Orner R Linda Grimmer, FNP

## 2023-01-26 NOTE — Assessment & Plan Note (Signed)
 Continue Lamictal 200 mg daily, olanzapine 20 mg daily, Celexa 10 mg daily Patient encouraged to maintain close follow-up with psychiatrist

## 2023-01-26 NOTE — Assessment & Plan Note (Signed)
 Has upcoming appointment with podiatry for nail care tomorrow

## 2023-01-26 NOTE — Assessment & Plan Note (Signed)
 Hospital chart reviewed, including discharge summary Medications reconciled and reviewed with the patient in detail

## 2023-01-26 NOTE — Assessment & Plan Note (Signed)
 Chronic medical condition Currently on sodium chloride  2 g 3 times daily.  Stated that he has been taking 1 g instead of 2 g Rechecking BMP today Encouraged to avoid alcohol Lab Results  Component Value Date   NA 128 (L) 01/21/2023   K 4.1 01/21/2023   CO2 26 01/21/2023   GLUCOSE 102 (H) 01/21/2023   BUN 10 01/21/2023   CREATININE 0.46 (L) 01/21/2023   CALCIUM  8.0 (L) 01/21/2023   EGFR 101 01/05/2022   GFRNONAA >60 01/21/2023

## 2023-01-26 NOTE — Patient Instructions (Signed)
 Please consider getting Shingrix and Tdap vaccine at local pharmacy.   1. Bilateral cellulitis of lower leg (Primary)   2. Essential hypertension  - Basic Metabolic Panel  3. Overactive bladder  - oxybutynin  (DITROPAN -XL) 10 MG 24 hr tablet; Take 1 tablet (10 mg total) by mouth at bedtime.  Dispense: 90 tablet; Refill: 0  4. Paroxysmal atrial fibrillation (HCC)   5. Tobacco use disorder   6. Hyponatremia  - Basic Metabolic Panel - sodium chloride  1 g tablet; Take 2 tablets (2 g total) by mouth 3 (three) times daily with meals.  Dispense: 60 tablet; Refill: 3  7. Need for influenza vaccination  - Flu vaccine trivalent PF, 6mos and older(Flulaval,Afluria,Fluarix,Fluzone)   It is important that you exercise regularly at least 30 minutes 5 times a week as tolerated  Think about what you will eat, plan ahead. Choose  clean, green, fresh or frozen over canned, processed or packaged foods which are more sugary, salty and fatty. 70 to 75% of food eaten should be vegetables and fruit. Three meals at set times with snacks allowed between meals, but they must be fruit or vegetables. Aim to eat over a 12 hour period , example 7 am to 7 pm, and STOP after  your last meal of the day. Drink water,generally about 64 ounces per day, no other drink is as healthy. Fruit juice is best enjoyed in a healthy way, by EATING the fruit.  [facility] for [facility] we consider it a privelige to serve you.

## 2023-01-26 NOTE — Assessment & Plan Note (Signed)
 Smokes about 1 pack/day  Asked about quitting: confirms that he/she currently smokes cigarettes Advise to quit smoking: Educated about QUITTING to reduce the risk of cancer, cardio and cerebrovascular disease. Assess willingness: Unwilling to quit at this time, but is working on cutting back. Assist with counseling and pharmacotherapy: Counseled for 5 minutes and literature provided. Arrange for follow up: follow up in 4 months and continue to offer help.  Low-dose chest CT scan ordered to screen for lung cancer

## 2023-01-26 NOTE — Assessment & Plan Note (Signed)
 BP Readings from Last 3 Encounters:  01/26/23 110/64  01/21/23 (!) 146/81  03/21/22 124/74  Controlled on Cardizem 180 mg daily Continue current medication

## 2023-01-26 NOTE — Assessment & Plan Note (Signed)
Continue oxybutynin 10 mg daily 

## 2023-01-26 NOTE — Assessment & Plan Note (Signed)
 Continue Cardizem 180 mg daily, aspirin 81 mg daily

## 2023-01-26 NOTE — Assessment & Plan Note (Signed)
 Uses a walker for ambulation Followed by orthopedics Continue gabapentin 100 mg twice daily

## 2023-01-26 NOTE — Assessment & Plan Note (Signed)
 Unna boots noted on bilateral lower extremity Dressing is clean and dry Has hypertrophic long nails bilaterally, stated that he has an upcoming appointment with podiatry tomorrow Continue wound dressing at home, home health RN will be coming to the home this week for wound care

## 2023-01-26 NOTE — Assessment & Plan Note (Signed)
 Drinks alcohol occasionally, need to quit drinking alcohol discussed Takes naltrexone 50 mg daily

## 2023-01-26 NOTE — Assessment & Plan Note (Signed)
 Patient educated on CDC recommendation for the vaccine. Verbal consent was obtained from the patient, vaccine administered by nurse, no sign of adverse reactions noted at this time. Patient education on arm soreness and use of tylenol or  for this patient  was discussed. Patient educated on the signs and symptoms of adverse effect and advise to contact the office if they occur. Vaccine information sheet given to patient.

## 2023-01-27 ENCOUNTER — Telehealth: Payer: Self-pay

## 2023-01-27 LAB — BASIC METABOLIC PANEL
BUN/Creatinine Ratio: 10 (ref 9–20)
BUN: 6 mg/dL (ref 6–24)
CO2: 26 mmol/L (ref 20–29)
Calcium: 9 mg/dL (ref 8.7–10.2)
Chloride: 95 mmol/L — ABNORMAL LOW (ref 96–106)
Creatinine, Ser: 0.62 mg/dL — ABNORMAL LOW (ref 0.76–1.27)
Glucose: 105 mg/dL — ABNORMAL HIGH (ref 70–99)
Potassium: 4.9 mmol/L (ref 3.5–5.2)
Sodium: 133 mmol/L — ABNORMAL LOW (ref 134–144)
eGFR: 111 mL/min/{1.73_m2} (ref >59)

## 2023-01-27 NOTE — Telephone Encounter (Signed)
 Copied from CRM [id number]. Topic: Clinical - Home Health Verbal Orders >> Jan 27, 2023 10:08 AM Curlee DEL wrote: Caller/Agency: [facility] Callback Number: [phone] - Genna to leave a message on this line. Service Requested: Physical Therapy Frequency: once a week for 1 week Any new concerns about the patient? Yes, patient asked if we could start Physical therapy next week instead of this week.

## 2023-01-28 ENCOUNTER — Ambulatory Visit: Payer: Medicare HMO | Admitting: Podiatry

## 2023-01-28 NOTE — Telephone Encounter (Signed)
 Ranell Patrick of approval. Brayton Caves

## 2023-01-29 DIAGNOSIS — I10 Essential (primary) hypertension: Secondary | ICD-10-CM | POA: Diagnosis not present

## 2023-01-29 DIAGNOSIS — R238 Other skin changes: Secondary | ICD-10-CM | POA: Diagnosis not present

## 2023-01-29 DIAGNOSIS — F319 Bipolar disorder, unspecified: Secondary | ICD-10-CM | POA: Diagnosis not present

## 2023-01-29 DIAGNOSIS — L03115 Cellulitis of right lower limb: Secondary | ICD-10-CM | POA: Diagnosis not present

## 2023-01-29 DIAGNOSIS — I480 Paroxysmal atrial fibrillation: Secondary | ICD-10-CM | POA: Diagnosis not present

## 2023-01-29 DIAGNOSIS — L03116 Cellulitis of left lower limb: Secondary | ICD-10-CM | POA: Diagnosis not present

## 2023-01-29 DIAGNOSIS — N3281 Overactive bladder: Secondary | ICD-10-CM | POA: Diagnosis not present

## 2023-01-29 DIAGNOSIS — G40909 Epilepsy, unspecified, not intractable, without status epilepticus: Secondary | ICD-10-CM | POA: Diagnosis not present

## 2023-01-29 DIAGNOSIS — E871 Hypo-osmolality and hyponatremia: Secondary | ICD-10-CM | POA: Diagnosis not present

## 2023-01-31 ENCOUNTER — Other Ambulatory Visit: Payer: Self-pay | Admitting: Neurology

## 2023-01-31 DIAGNOSIS — F319 Bipolar disorder, unspecified: Secondary | ICD-10-CM

## 2023-01-31 DIAGNOSIS — F1021 Alcohol dependence, in remission: Secondary | ICD-10-CM

## 2023-02-01 ENCOUNTER — Other Ambulatory Visit: Payer: Self-pay | Admitting: Nurse Practitioner

## 2023-02-01 DIAGNOSIS — N3281 Overactive bladder: Secondary | ICD-10-CM

## 2023-02-02 DIAGNOSIS — L03115 Cellulitis of right lower limb: Secondary | ICD-10-CM | POA: Diagnosis not present

## 2023-02-02 DIAGNOSIS — N3281 Overactive bladder: Secondary | ICD-10-CM | POA: Diagnosis not present

## 2023-02-02 DIAGNOSIS — G40909 Epilepsy, unspecified, not intractable, without status epilepticus: Secondary | ICD-10-CM | POA: Diagnosis not present

## 2023-02-02 DIAGNOSIS — L03116 Cellulitis of left lower limb: Secondary | ICD-10-CM | POA: Diagnosis not present

## 2023-02-02 DIAGNOSIS — I480 Paroxysmal atrial fibrillation: Secondary | ICD-10-CM | POA: Diagnosis not present

## 2023-02-02 DIAGNOSIS — F319 Bipolar disorder, unspecified: Secondary | ICD-10-CM | POA: Diagnosis not present

## 2023-02-02 DIAGNOSIS — I10 Essential (primary) hypertension: Secondary | ICD-10-CM | POA: Diagnosis not present

## 2023-02-02 DIAGNOSIS — R238 Other skin changes: Secondary | ICD-10-CM | POA: Diagnosis not present

## 2023-02-02 DIAGNOSIS — E871 Hypo-osmolality and hyponatremia: Secondary | ICD-10-CM | POA: Diagnosis not present

## 2023-02-05 DIAGNOSIS — E871 Hypo-osmolality and hyponatremia: Secondary | ICD-10-CM | POA: Diagnosis not present

## 2023-02-05 DIAGNOSIS — L03115 Cellulitis of right lower limb: Secondary | ICD-10-CM | POA: Diagnosis not present

## 2023-02-05 DIAGNOSIS — L03116 Cellulitis of left lower limb: Secondary | ICD-10-CM | POA: Diagnosis not present

## 2023-02-05 DIAGNOSIS — R238 Other skin changes: Secondary | ICD-10-CM | POA: Diagnosis not present

## 2023-02-05 DIAGNOSIS — N3281 Overactive bladder: Secondary | ICD-10-CM | POA: Diagnosis not present

## 2023-02-05 DIAGNOSIS — G40909 Epilepsy, unspecified, not intractable, without status epilepticus: Secondary | ICD-10-CM | POA: Diagnosis not present

## 2023-02-05 DIAGNOSIS — F319 Bipolar disorder, unspecified: Secondary | ICD-10-CM | POA: Diagnosis not present

## 2023-02-05 DIAGNOSIS — I10 Essential (primary) hypertension: Secondary | ICD-10-CM | POA: Diagnosis not present

## 2023-02-05 DIAGNOSIS — I480 Paroxysmal atrial fibrillation: Secondary | ICD-10-CM | POA: Diagnosis not present

## 2023-02-07 ENCOUNTER — Other Ambulatory Visit: Payer: Self-pay | Admitting: Nurse Practitioner

## 2023-02-08 DIAGNOSIS — G40909 Epilepsy, unspecified, not intractable, without status epilepticus: Secondary | ICD-10-CM | POA: Diagnosis not present

## 2023-02-08 DIAGNOSIS — L03116 Cellulitis of left lower limb: Secondary | ICD-10-CM | POA: Diagnosis not present

## 2023-02-08 DIAGNOSIS — I10 Essential (primary) hypertension: Secondary | ICD-10-CM | POA: Diagnosis not present

## 2023-02-08 DIAGNOSIS — E871 Hypo-osmolality and hyponatremia: Secondary | ICD-10-CM | POA: Diagnosis not present

## 2023-02-08 DIAGNOSIS — N3281 Overactive bladder: Secondary | ICD-10-CM | POA: Diagnosis not present

## 2023-02-08 DIAGNOSIS — L03115 Cellulitis of right lower limb: Secondary | ICD-10-CM | POA: Diagnosis not present

## 2023-02-08 DIAGNOSIS — R238 Other skin changes: Secondary | ICD-10-CM | POA: Diagnosis not present

## 2023-02-08 DIAGNOSIS — F319 Bipolar disorder, unspecified: Secondary | ICD-10-CM | POA: Diagnosis not present

## 2023-02-08 DIAGNOSIS — I480 Paroxysmal atrial fibrillation: Secondary | ICD-10-CM | POA: Diagnosis not present

## 2023-02-09 DIAGNOSIS — I480 Paroxysmal atrial fibrillation: Secondary | ICD-10-CM | POA: Diagnosis not present

## 2023-02-09 DIAGNOSIS — L03116 Cellulitis of left lower limb: Secondary | ICD-10-CM | POA: Diagnosis not present

## 2023-02-09 DIAGNOSIS — L03115 Cellulitis of right lower limb: Secondary | ICD-10-CM | POA: Diagnosis not present

## 2023-02-09 DIAGNOSIS — G40909 Epilepsy, unspecified, not intractable, without status epilepticus: Secondary | ICD-10-CM | POA: Diagnosis not present

## 2023-02-09 DIAGNOSIS — F319 Bipolar disorder, unspecified: Secondary | ICD-10-CM | POA: Diagnosis not present

## 2023-02-09 DIAGNOSIS — I10 Essential (primary) hypertension: Secondary | ICD-10-CM | POA: Diagnosis not present

## 2023-02-09 DIAGNOSIS — N3281 Overactive bladder: Secondary | ICD-10-CM | POA: Diagnosis not present

## 2023-02-09 DIAGNOSIS — R238 Other skin changes: Secondary | ICD-10-CM | POA: Diagnosis not present

## 2023-02-09 DIAGNOSIS — E871 Hypo-osmolality and hyponatremia: Secondary | ICD-10-CM | POA: Diagnosis not present

## 2023-02-12 DIAGNOSIS — G40909 Epilepsy, unspecified, not intractable, without status epilepticus: Secondary | ICD-10-CM | POA: Diagnosis not present

## 2023-02-12 DIAGNOSIS — N3281 Overactive bladder: Secondary | ICD-10-CM | POA: Diagnosis not present

## 2023-02-12 DIAGNOSIS — F319 Bipolar disorder, unspecified: Secondary | ICD-10-CM | POA: Diagnosis not present

## 2023-02-12 DIAGNOSIS — R238 Other skin changes: Secondary | ICD-10-CM | POA: Diagnosis not present

## 2023-02-12 DIAGNOSIS — L03116 Cellulitis of left lower limb: Secondary | ICD-10-CM | POA: Diagnosis not present

## 2023-02-12 DIAGNOSIS — L03115 Cellulitis of right lower limb: Secondary | ICD-10-CM | POA: Diagnosis not present

## 2023-02-12 DIAGNOSIS — E871 Hypo-osmolality and hyponatremia: Secondary | ICD-10-CM | POA: Diagnosis not present

## 2023-02-12 DIAGNOSIS — I480 Paroxysmal atrial fibrillation: Secondary | ICD-10-CM | POA: Diagnosis not present

## 2023-02-12 DIAGNOSIS — I10 Essential (primary) hypertension: Secondary | ICD-10-CM | POA: Diagnosis not present

## 2023-02-16 DIAGNOSIS — I10 Essential (primary) hypertension: Secondary | ICD-10-CM | POA: Diagnosis not present

## 2023-02-16 DIAGNOSIS — G40909 Epilepsy, unspecified, not intractable, without status epilepticus: Secondary | ICD-10-CM | POA: Diagnosis not present

## 2023-02-16 DIAGNOSIS — L03116 Cellulitis of left lower limb: Secondary | ICD-10-CM | POA: Diagnosis not present

## 2023-02-16 DIAGNOSIS — N3281 Overactive bladder: Secondary | ICD-10-CM | POA: Diagnosis not present

## 2023-02-16 DIAGNOSIS — E871 Hypo-osmolality and hyponatremia: Secondary | ICD-10-CM | POA: Diagnosis not present

## 2023-02-16 DIAGNOSIS — R238 Other skin changes: Secondary | ICD-10-CM | POA: Diagnosis not present

## 2023-02-16 DIAGNOSIS — I480 Paroxysmal atrial fibrillation: Secondary | ICD-10-CM | POA: Diagnosis not present

## 2023-02-16 DIAGNOSIS — F319 Bipolar disorder, unspecified: Secondary | ICD-10-CM | POA: Diagnosis not present

## 2023-02-16 DIAGNOSIS — L03115 Cellulitis of right lower limb: Secondary | ICD-10-CM | POA: Diagnosis not present

## 2023-02-19 DIAGNOSIS — F319 Bipolar disorder, unspecified: Secondary | ICD-10-CM | POA: Diagnosis not present

## 2023-02-19 DIAGNOSIS — L03116 Cellulitis of left lower limb: Secondary | ICD-10-CM | POA: Diagnosis not present

## 2023-02-19 DIAGNOSIS — N3281 Overactive bladder: Secondary | ICD-10-CM | POA: Diagnosis not present

## 2023-02-19 DIAGNOSIS — E871 Hypo-osmolality and hyponatremia: Secondary | ICD-10-CM | POA: Diagnosis not present

## 2023-02-19 DIAGNOSIS — R238 Other skin changes: Secondary | ICD-10-CM | POA: Diagnosis not present

## 2023-02-19 DIAGNOSIS — G40909 Epilepsy, unspecified, not intractable, without status epilepticus: Secondary | ICD-10-CM | POA: Diagnosis not present

## 2023-02-19 DIAGNOSIS — I10 Essential (primary) hypertension: Secondary | ICD-10-CM | POA: Diagnosis not present

## 2023-02-19 DIAGNOSIS — I480 Paroxysmal atrial fibrillation: Secondary | ICD-10-CM | POA: Diagnosis not present

## 2023-02-19 DIAGNOSIS — L03115 Cellulitis of right lower limb: Secondary | ICD-10-CM | POA: Diagnosis not present

## 2023-02-23 ENCOUNTER — Other Ambulatory Visit: Payer: Self-pay | Admitting: Neurology

## 2023-02-23 ENCOUNTER — Telehealth: Payer: Self-pay | Admitting: Neurology

## 2023-02-23 DIAGNOSIS — I480 Paroxysmal atrial fibrillation: Secondary | ICD-10-CM | POA: Diagnosis not present

## 2023-02-23 DIAGNOSIS — L03116 Cellulitis of left lower limb: Secondary | ICD-10-CM | POA: Diagnosis not present

## 2023-02-23 DIAGNOSIS — I10 Essential (primary) hypertension: Secondary | ICD-10-CM | POA: Diagnosis not present

## 2023-02-23 DIAGNOSIS — R238 Other skin changes: Secondary | ICD-10-CM | POA: Diagnosis not present

## 2023-02-23 DIAGNOSIS — F319 Bipolar disorder, unspecified: Secondary | ICD-10-CM | POA: Diagnosis not present

## 2023-02-23 DIAGNOSIS — E871 Hypo-osmolality and hyponatremia: Secondary | ICD-10-CM | POA: Diagnosis not present

## 2023-02-23 DIAGNOSIS — L03115 Cellulitis of right lower limb: Secondary | ICD-10-CM | POA: Diagnosis not present

## 2023-02-23 DIAGNOSIS — N3281 Overactive bladder: Secondary | ICD-10-CM | POA: Diagnosis not present

## 2023-02-23 DIAGNOSIS — G40909 Epilepsy, unspecified, not intractable, without status epilepticus: Secondary | ICD-10-CM | POA: Diagnosis not present

## 2023-02-23 NOTE — Telephone Encounter (Signed)
LVM and sent mychart msg informing pt of need to reschedule 06/24/23 appt - MD out

## 2023-02-26 DIAGNOSIS — E871 Hypo-osmolality and hyponatremia: Secondary | ICD-10-CM | POA: Diagnosis not present

## 2023-02-26 DIAGNOSIS — F319 Bipolar disorder, unspecified: Secondary | ICD-10-CM | POA: Diagnosis not present

## 2023-02-26 DIAGNOSIS — I480 Paroxysmal atrial fibrillation: Secondary | ICD-10-CM | POA: Diagnosis not present

## 2023-02-26 DIAGNOSIS — I10 Essential (primary) hypertension: Secondary | ICD-10-CM | POA: Diagnosis not present

## 2023-02-26 DIAGNOSIS — L03115 Cellulitis of right lower limb: Secondary | ICD-10-CM | POA: Diagnosis not present

## 2023-02-26 DIAGNOSIS — R238 Other skin changes: Secondary | ICD-10-CM | POA: Diagnosis not present

## 2023-02-26 DIAGNOSIS — G40909 Epilepsy, unspecified, not intractable, without status epilepticus: Secondary | ICD-10-CM | POA: Diagnosis not present

## 2023-02-26 DIAGNOSIS — L03116 Cellulitis of left lower limb: Secondary | ICD-10-CM | POA: Diagnosis not present

## 2023-02-26 DIAGNOSIS — N3281 Overactive bladder: Secondary | ICD-10-CM | POA: Diagnosis not present

## 2023-03-02 DIAGNOSIS — I480 Paroxysmal atrial fibrillation: Secondary | ICD-10-CM | POA: Diagnosis not present

## 2023-03-02 DIAGNOSIS — L03116 Cellulitis of left lower limb: Secondary | ICD-10-CM | POA: Diagnosis not present

## 2023-03-02 DIAGNOSIS — E871 Hypo-osmolality and hyponatremia: Secondary | ICD-10-CM | POA: Diagnosis not present

## 2023-03-02 DIAGNOSIS — L03115 Cellulitis of right lower limb: Secondary | ICD-10-CM | POA: Diagnosis not present

## 2023-03-02 DIAGNOSIS — F319 Bipolar disorder, unspecified: Secondary | ICD-10-CM | POA: Diagnosis not present

## 2023-03-02 DIAGNOSIS — R238 Other skin changes: Secondary | ICD-10-CM | POA: Diagnosis not present

## 2023-03-02 DIAGNOSIS — I10 Essential (primary) hypertension: Secondary | ICD-10-CM | POA: Diagnosis not present

## 2023-03-02 DIAGNOSIS — G40909 Epilepsy, unspecified, not intractable, without status epilepticus: Secondary | ICD-10-CM | POA: Diagnosis not present

## 2023-03-02 DIAGNOSIS — N3281 Overactive bladder: Secondary | ICD-10-CM | POA: Diagnosis not present

## 2023-03-05 DIAGNOSIS — N3281 Overactive bladder: Secondary | ICD-10-CM | POA: Diagnosis not present

## 2023-03-05 DIAGNOSIS — I480 Paroxysmal atrial fibrillation: Secondary | ICD-10-CM | POA: Diagnosis not present

## 2023-03-05 DIAGNOSIS — I10 Essential (primary) hypertension: Secondary | ICD-10-CM | POA: Diagnosis not present

## 2023-03-05 DIAGNOSIS — F319 Bipolar disorder, unspecified: Secondary | ICD-10-CM | POA: Diagnosis not present

## 2023-03-05 DIAGNOSIS — L03115 Cellulitis of right lower limb: Secondary | ICD-10-CM | POA: Diagnosis not present

## 2023-03-05 DIAGNOSIS — L03116 Cellulitis of left lower limb: Secondary | ICD-10-CM | POA: Diagnosis not present

## 2023-03-05 DIAGNOSIS — G40909 Epilepsy, unspecified, not intractable, without status epilepticus: Secondary | ICD-10-CM | POA: Diagnosis not present

## 2023-03-05 DIAGNOSIS — E871 Hypo-osmolality and hyponatremia: Secondary | ICD-10-CM | POA: Diagnosis not present

## 2023-03-05 DIAGNOSIS — R238 Other skin changes: Secondary | ICD-10-CM | POA: Diagnosis not present

## 2023-03-09 ENCOUNTER — Ambulatory Visit (INDEPENDENT_AMBULATORY_CARE_PROVIDER_SITE_OTHER): Payer: Medicare HMO | Admitting: Podiatry

## 2023-03-09 DIAGNOSIS — I872 Venous insufficiency (chronic) (peripheral): Secondary | ICD-10-CM | POA: Diagnosis not present

## 2023-03-09 DIAGNOSIS — B351 Tinea unguium: Secondary | ICD-10-CM | POA: Diagnosis not present

## 2023-03-09 DIAGNOSIS — M79674 Pain in right toe(s): Secondary | ICD-10-CM

## 2023-03-09 DIAGNOSIS — M79675 Pain in left toe(s): Secondary | ICD-10-CM

## 2023-03-09 NOTE — Progress Notes (Unsigned)
Subjective:   Patient ID: Billy Anderson, male   DOB: 58 y.o.   MRN: [id number]   HPI No chief complaint on file.  58 year old male presents the office today for concerns of thick, elongated nails that he is not able to trim himself.  No swelling redness or drainage of the toenail sites.  He was previously hospitalized last month for cellulitis of the left lower extremity. He has a home health nurse that is wrapping his legs. The wound is doing much better he states.   Review of Systems  All other systems reviewed and are negative.  Past Medical History:  Diagnosis Date   Arthritis    psoriatic arthritis   Bipolar disorder (HCC)    Dysrhythmia    PAF   GERD (gastroesophageal reflux disease)    Hypertension    Pneumonia    Seizures (HCC)     Past Surgical History:  Procedure Laterality Date   ANTERIOR CERVICAL DECOMP/DISCECTOMY FUSION N/A 03/20/2022   Procedure: Cervical Three-Four, Cervical Four-Five Anterior Cervical Discectomy Fusion;  Surgeon: Coletta Memos, MD;  Location: [facility] OR;  Service: Neurosurgery;  Laterality: N/A;  RM 20 to follow 3C   HAND SURGERY Left    carpal tunnel release     Current Outpatient Medications:    aspirin EC 81 MG tablet, Take 1 tablet (81 mg total) by mouth daily., Disp: 30 tablet, Rfl: 0   Calcium Carb-Cholecalciferol (CALCIUM 600+D3 PO), Take 1 tablet by mouth 2 (two) times daily., Disp: , Rfl:    citalopram (CELEXA) 10 MG tablet, Take 1 tablet (10 mg total) by mouth at bedtime., Disp: 90 tablet, Rfl: 0   diltiazem (CARDIZEM CD) 180 MG 24 hr capsule, TAKE 2 CAPSULES BY MOUTH DAILY, Disp: 60 capsule, Rfl: 3   docusate sodium (COLACE) 100 MG capsule, Take 1 capsule (100 mg total) by mouth 2 (two) times daily., Disp: 10 capsule, Rfl: 0   Fexofenadine HCl (ALLEGRA PO), Take 180 mg by mouth daily., Disp: , Rfl:    folic acid (FOLVITE) 1 MG tablet, Take 1 tablet (1 mg total) by mouth daily., Disp: 90 tablet, Rfl: 0   gabapentin (NEURONTIN) 100 MG  capsule, Take 1 capsule (100 mg total) by mouth 2 (two) times daily for 10 days., Disp: 180 capsule, Rfl: 1   HUMIRA, 2 PEN, 40 MG/0.8ML AJKT pen, Inject 0.8 mLs into the skin every 14 (fourteen) days. (Patient not taking: Reported on 01/26/2023), Disp: , Rfl:    lamoTRIgine (LAMICTAL) 100 MG tablet, TAKE TWO TABLETS BY MOUTH AT BEDTIME, Disp: 60 tablet, Rfl: 11   Multiple Vitamin (MULTIVITAMIN) tablet, Take 1 tablet by mouth daily. (Patient taking differently: Take 1 tablet by mouth in the morning and at bedtime.), Disp: , Rfl:    naltrexone (DEPADE) 50 MG tablet, Take 1 tablet (50 mg total) by mouth daily. (Patient taking differently: Take 50 mg by mouth at bedtime.), Disp: 90 tablet, Rfl: 0   OLANZapine (ZYPREXA) 20 MG tablet, Take 1 tablet (20 mg total) by mouth at bedtime., Disp: 90 tablet, Rfl: 0   oxybutynin (DITROPAN-XL) 10 MG 24 hr tablet, TAKE 1 TABLET BY MOUTH AT BEDTIME, Disp: 90 tablet, Rfl: 0   pantoprazole (PROTONIX) 40 MG tablet, TAKE 1 TABLET BY MOUTH DAILY, Disp: 30 tablet, Rfl: 3   pyridOXINE (VITAMIN B6) 100 MG tablet, Take 1 tablet (100 mg total) by mouth daily. (Patient taking differently: Take 100 mg by mouth in the morning and at bedtime.), Disp: 30 tablet, Rfl:  0   sodium chloride 1 g tablet, Take 2 tablets (2 g total) by mouth 3 (three) times daily with meals., Disp: 60 tablet, Rfl: 3   thiamine (VITAMIN B-1) 100 MG tablet, Take 1 tablet (100 mg total) by mouth daily., Disp: 30 tablet, Rfl: 0  Allergies  Allergen Reactions   Ativan [Lorazepam] Other (See Comments)    Confusion  Hallucinations    Roxicodone [Oxycodone] Itching   Ultram [Tramadol] Other (See Comments)    Seizures    Vistaril [Hydroxyzine] Itching and Other (See Comments)    Sedation Not effective in reducing anxiety           Objective:  Physical Exam  General: AAO x3, NAD  Dermatological: Nails are hypertrophic, dystrophic, brittle, discolored, elongated 10. No surrounding redness or  drainage. Tenderness nails 1-5 bilaterally.  Ulceration on the left leg appears to have resolved.  There is no new ulcerations. Venous insuffiencey noted.   Vascular: Dorsalis Pedis artery and Posterior Tibial artery pedal pulses are palpable  bilateral with immedate capillary fill time. Pedal hair growth present.  There is no pain with calf compression, swelling, warmth, erythema.   Neruologic: Sensation decreased with SWMF.   Musculoskeletal: No other areas of discomfort noted.      Assessment:   Symptomatic onychomycosis      Plan:  -Treatment options discussed including all alternatives, risks, and complications -Etiology of symptoms were discussed -Sharply debrided the nails x 10 without any complications or bleeding. Discussed urea nail gel for the thicker toenails, especially the big toenails.  -Continue home health dressing changes as ordered. -Elevation -Monitor for any clinical signs or symptoms of infection and directed to call the office immediately should any occur or go to the ER.  Return in about 3 months (around 06/06/2023).  Vivi Barrack DPM

## 2023-03-09 NOTE — Patient Instructions (Signed)
You can use UREA NAIL GEL on the thicker toenails   Apply a small amount of antibiotic ointment and band-aid on the right 4th toe until healed. Monitor for any signs/symptoms of infection. Call the office immediately if any occur or go directly to the emergency room. Call with any questions/concerns.  Keep feet elevated

## 2023-03-10 DIAGNOSIS — L03116 Cellulitis of left lower limb: Secondary | ICD-10-CM | POA: Diagnosis not present

## 2023-03-10 DIAGNOSIS — L03115 Cellulitis of right lower limb: Secondary | ICD-10-CM | POA: Diagnosis not present

## 2023-03-10 DIAGNOSIS — I480 Paroxysmal atrial fibrillation: Secondary | ICD-10-CM | POA: Diagnosis not present

## 2023-03-10 DIAGNOSIS — G40909 Epilepsy, unspecified, not intractable, without status epilepticus: Secondary | ICD-10-CM | POA: Diagnosis not present

## 2023-03-10 DIAGNOSIS — I10 Essential (primary) hypertension: Secondary | ICD-10-CM | POA: Diagnosis not present

## 2023-03-10 DIAGNOSIS — F319 Bipolar disorder, unspecified: Secondary | ICD-10-CM | POA: Diagnosis not present

## 2023-03-10 DIAGNOSIS — E871 Hypo-osmolality and hyponatremia: Secondary | ICD-10-CM | POA: Diagnosis not present

## 2023-03-10 DIAGNOSIS — R238 Other skin changes: Secondary | ICD-10-CM | POA: Diagnosis not present

## 2023-03-10 DIAGNOSIS — N3281 Overactive bladder: Secondary | ICD-10-CM | POA: Diagnosis not present

## 2023-03-12 DIAGNOSIS — I10 Essential (primary) hypertension: Secondary | ICD-10-CM | POA: Diagnosis not present

## 2023-03-12 DIAGNOSIS — N3281 Overactive bladder: Secondary | ICD-10-CM | POA: Diagnosis not present

## 2023-03-12 DIAGNOSIS — F319 Bipolar disorder, unspecified: Secondary | ICD-10-CM | POA: Diagnosis not present

## 2023-03-12 DIAGNOSIS — L03116 Cellulitis of left lower limb: Secondary | ICD-10-CM | POA: Diagnosis not present

## 2023-03-12 DIAGNOSIS — R238 Other skin changes: Secondary | ICD-10-CM | POA: Diagnosis not present

## 2023-03-12 DIAGNOSIS — L03115 Cellulitis of right lower limb: Secondary | ICD-10-CM | POA: Diagnosis not present

## 2023-03-12 DIAGNOSIS — G40909 Epilepsy, unspecified, not intractable, without status epilepticus: Secondary | ICD-10-CM | POA: Diagnosis not present

## 2023-03-12 DIAGNOSIS — I480 Paroxysmal atrial fibrillation: Secondary | ICD-10-CM | POA: Diagnosis not present

## 2023-03-12 DIAGNOSIS — E871 Hypo-osmolality and hyponatremia: Secondary | ICD-10-CM | POA: Diagnosis not present

## 2023-03-13 ENCOUNTER — Other Ambulatory Visit (HOSPITAL_COMMUNITY): Payer: Self-pay | Admitting: Psychiatry

## 2023-03-13 DIAGNOSIS — F319 Bipolar disorder, unspecified: Secondary | ICD-10-CM

## 2023-03-13 IMAGING — CR DG SHOULDER 2+V*R*
3 series · 3 of 3 positions shown · non-contrast
Comparison: None.

CLINICAL DATA: Pain with extension.  Bilateral shoulder pain.

EXAM:
RIGHT SHOULDER - 2+ VIEW

[x shoulder ap right (1 of 3)]
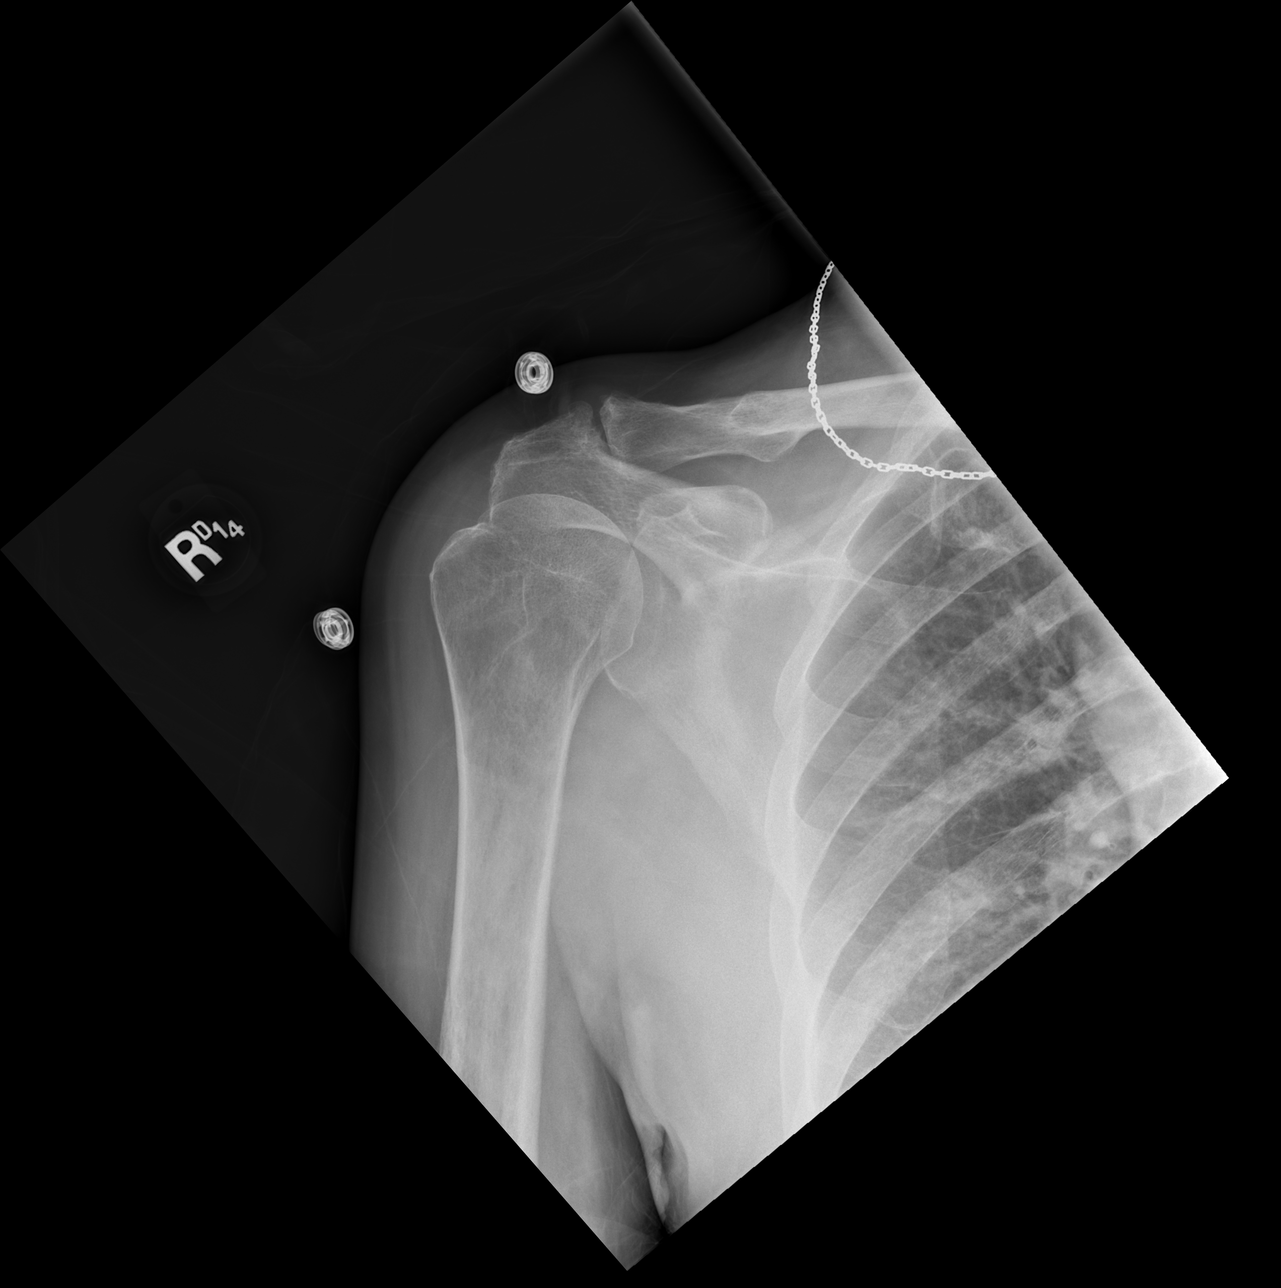

[x shoulder ap right (2 of 3)]
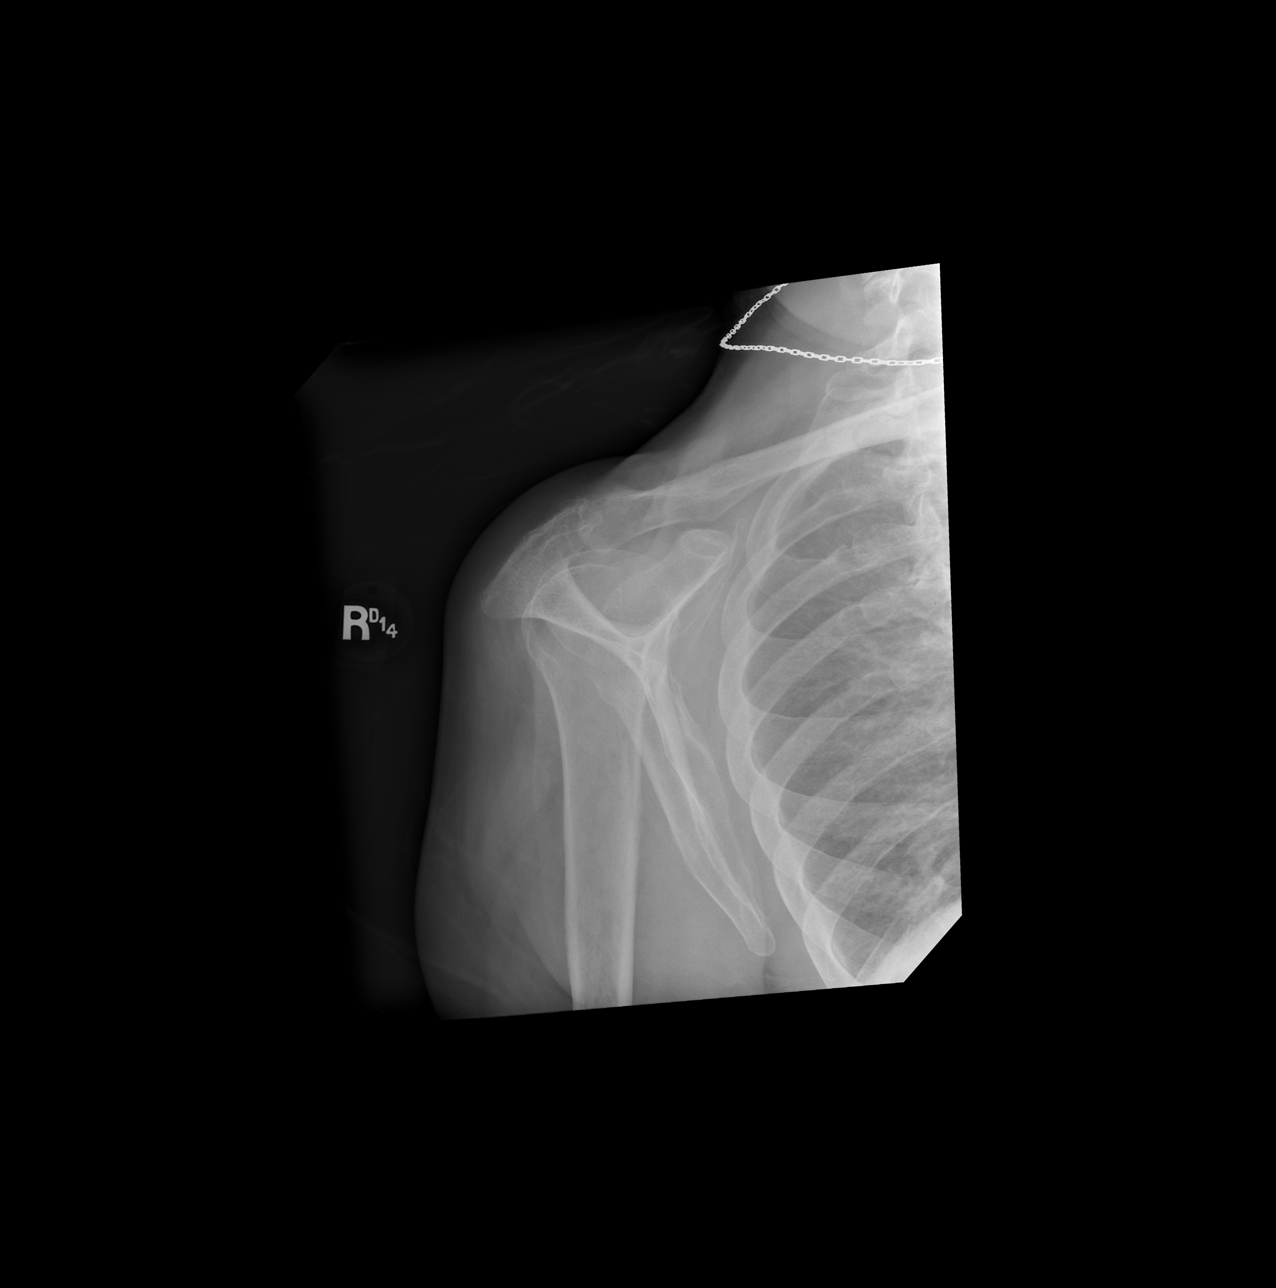

[x shoulder ap right (3 of 3)]
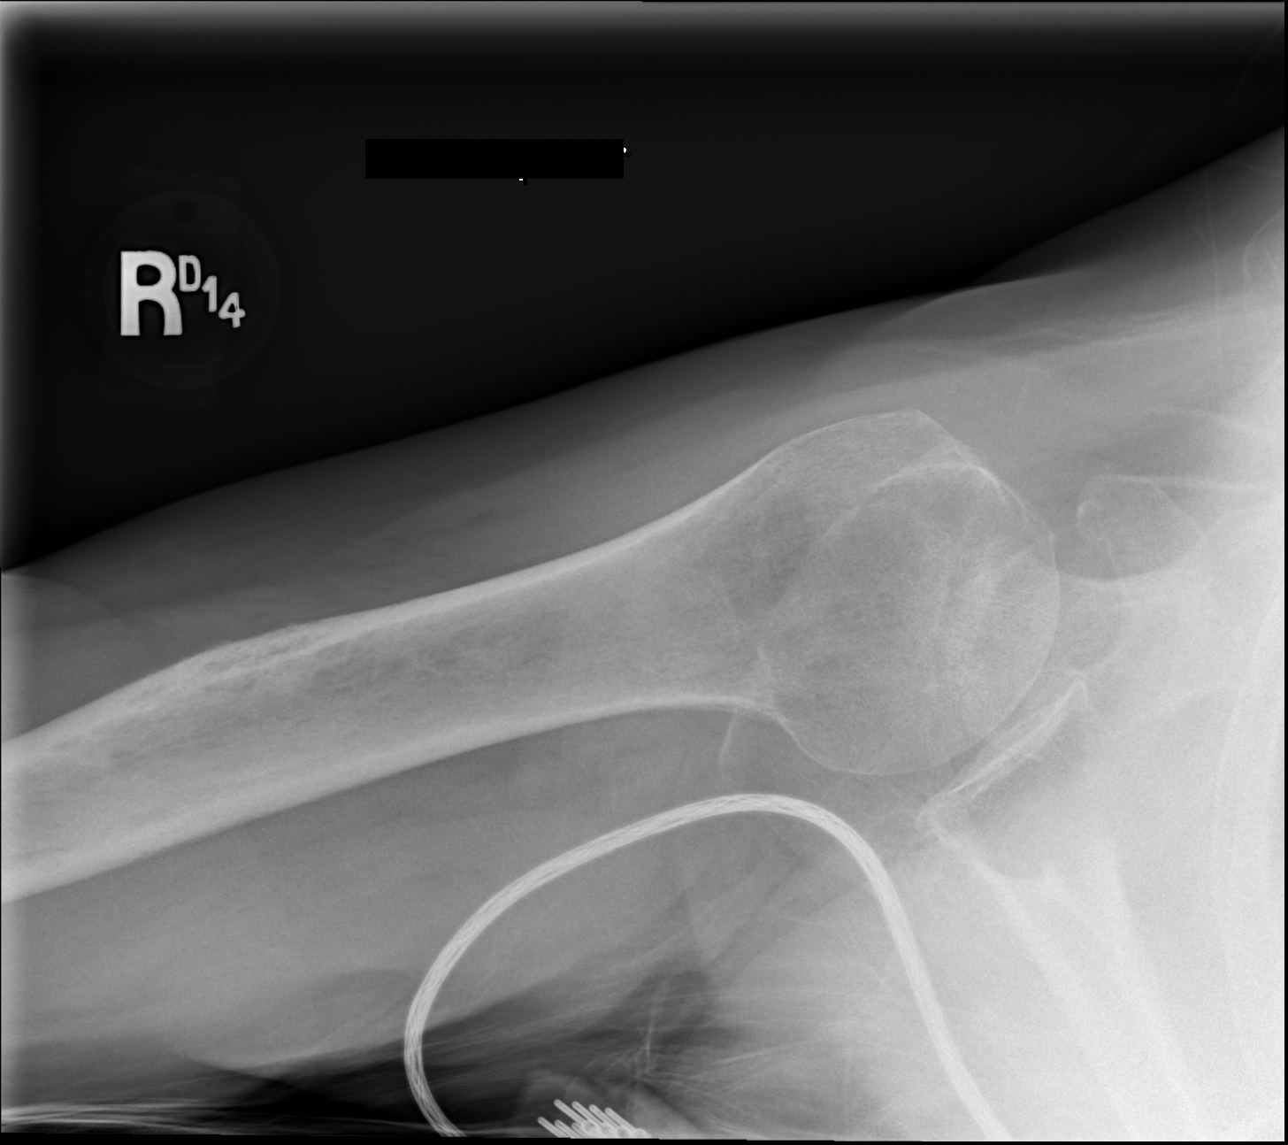

[3 of 3 positions shown; findings below may reference images not displayed]

FINDINGS: Shallow glenoid with mild spurring. Moderate acromioclavicular
degenerative change. No fracture, erosion or focal bone abnormality.
Mild subcortical cystic change in the lateral humeral head. No soft
tissue calcifications
IMPRESSION: 1. Mild glenohumeral and moderate acromioclavicular degenerative
change.
2. Mild subcortical cystic change in the lateral humeral head may be
rotator cuff arthropathy.

## 2023-03-16 DIAGNOSIS — L03116 Cellulitis of left lower limb: Secondary | ICD-10-CM | POA: Diagnosis not present

## 2023-03-16 DIAGNOSIS — N3281 Overactive bladder: Secondary | ICD-10-CM | POA: Diagnosis not present

## 2023-03-16 DIAGNOSIS — R238 Other skin changes: Secondary | ICD-10-CM | POA: Diagnosis not present

## 2023-03-16 DIAGNOSIS — L03115 Cellulitis of right lower limb: Secondary | ICD-10-CM | POA: Diagnosis not present

## 2023-03-16 DIAGNOSIS — G40909 Epilepsy, unspecified, not intractable, without status epilepticus: Secondary | ICD-10-CM | POA: Diagnosis not present

## 2023-03-16 DIAGNOSIS — I480 Paroxysmal atrial fibrillation: Secondary | ICD-10-CM | POA: Diagnosis not present

## 2023-03-16 DIAGNOSIS — I10 Essential (primary) hypertension: Secondary | ICD-10-CM | POA: Diagnosis not present

## 2023-03-16 DIAGNOSIS — F319 Bipolar disorder, unspecified: Secondary | ICD-10-CM | POA: Diagnosis not present

## 2023-03-16 DIAGNOSIS — E871 Hypo-osmolality and hyponatremia: Secondary | ICD-10-CM | POA: Diagnosis not present

## 2023-03-17 ENCOUNTER — Other Ambulatory Visit: Payer: Self-pay

## 2023-03-17 MED ORDER — PANTOPRAZOLE SODIUM 40 MG PO TBEC: 40.0000 mg | DELAYED_RELEASE_TABLET | Freq: Every day | ORAL | 3 refills | Status: DC

## 2023-03-19 DIAGNOSIS — G40909 Epilepsy, unspecified, not intractable, without status epilepticus: Secondary | ICD-10-CM | POA: Diagnosis not present

## 2023-03-19 DIAGNOSIS — I10 Essential (primary) hypertension: Secondary | ICD-10-CM | POA: Diagnosis not present

## 2023-03-19 DIAGNOSIS — R238 Other skin changes: Secondary | ICD-10-CM | POA: Diagnosis not present

## 2023-03-19 DIAGNOSIS — N3281 Overactive bladder: Secondary | ICD-10-CM | POA: Diagnosis not present

## 2023-03-19 DIAGNOSIS — L03115 Cellulitis of right lower limb: Secondary | ICD-10-CM | POA: Diagnosis not present

## 2023-03-19 DIAGNOSIS — I480 Paroxysmal atrial fibrillation: Secondary | ICD-10-CM | POA: Diagnosis not present

## 2023-03-19 DIAGNOSIS — F319 Bipolar disorder, unspecified: Secondary | ICD-10-CM | POA: Diagnosis not present

## 2023-03-19 DIAGNOSIS — L03116 Cellulitis of left lower limb: Secondary | ICD-10-CM | POA: Diagnosis not present

## 2023-03-19 DIAGNOSIS — E871 Hypo-osmolality and hyponatremia: Secondary | ICD-10-CM | POA: Diagnosis not present

## 2023-03-23 ENCOUNTER — Other Ambulatory Visit (HOSPITAL_COMMUNITY): Payer: Self-pay | Admitting: Psychiatry

## 2023-03-23 DIAGNOSIS — R238 Other skin changes: Secondary | ICD-10-CM | POA: Diagnosis not present

## 2023-03-23 DIAGNOSIS — E871 Hypo-osmolality and hyponatremia: Secondary | ICD-10-CM | POA: Diagnosis not present

## 2023-03-23 DIAGNOSIS — G40909 Epilepsy, unspecified, not intractable, without status epilepticus: Secondary | ICD-10-CM | POA: Diagnosis not present

## 2023-03-23 DIAGNOSIS — I10 Essential (primary) hypertension: Secondary | ICD-10-CM | POA: Diagnosis not present

## 2023-03-23 DIAGNOSIS — F319 Bipolar disorder, unspecified: Secondary | ICD-10-CM

## 2023-03-23 DIAGNOSIS — N3281 Overactive bladder: Secondary | ICD-10-CM | POA: Diagnosis not present

## 2023-03-23 DIAGNOSIS — L03116 Cellulitis of left lower limb: Secondary | ICD-10-CM | POA: Diagnosis not present

## 2023-03-23 DIAGNOSIS — I480 Paroxysmal atrial fibrillation: Secondary | ICD-10-CM | POA: Diagnosis not present

## 2023-03-23 DIAGNOSIS — L03115 Cellulitis of right lower limb: Secondary | ICD-10-CM | POA: Diagnosis not present

## 2023-04-06 DIAGNOSIS — L400 Psoriasis vulgaris: Secondary | ICD-10-CM | POA: Diagnosis not present

## 2023-04-06 DIAGNOSIS — L728 Other follicular cysts of the skin and subcutaneous tissue: Secondary | ICD-10-CM | POA: Diagnosis not present

## 2023-04-15 ENCOUNTER — Inpatient Hospital Stay (HOSPITAL_COMMUNITY)
Admission: EM | Admit: 2023-04-15 | Discharge: 2023-04-23 | DRG: 644 | Disposition: A | Discharge status: Home Health | Attending: Internal Medicine | Admitting: Internal Medicine

## 2023-04-15 ENCOUNTER — Other Ambulatory Visit: Payer: Self-pay

## 2023-04-15 ENCOUNTER — Emergency Department (HOSPITAL_COMMUNITY)

## 2023-04-15 ENCOUNTER — Encounter (HOSPITAL_COMMUNITY): Payer: Self-pay | Admitting: Emergency Medicine

## 2023-04-15 DIAGNOSIS — F1011 Alcohol abuse, in remission: Secondary | ICD-10-CM | POA: Diagnosis present

## 2023-04-15 DIAGNOSIS — K219 Gastro-esophageal reflux disease without esophagitis: Secondary | ICD-10-CM | POA: Diagnosis present

## 2023-04-15 DIAGNOSIS — M6282 Rhabdomyolysis: Secondary | ICD-10-CM | POA: Insufficient documentation

## 2023-04-15 DIAGNOSIS — Z885 Allergy status to narcotic agent status: Secondary | ICD-10-CM

## 2023-04-15 DIAGNOSIS — L97529 Non-pressure chronic ulcer of other part of left foot with unspecified severity: Secondary | ICD-10-CM | POA: Insufficient documentation

## 2023-04-15 DIAGNOSIS — Z981 Arthrodesis status: Secondary | ICD-10-CM | POA: Diagnosis not present

## 2023-04-15 DIAGNOSIS — E878 Other disorders of electrolyte and fluid balance, not elsewhere classified: Secondary | ICD-10-CM | POA: Diagnosis present

## 2023-04-15 DIAGNOSIS — R509 Fever, unspecified: Secondary | ICD-10-CM | POA: Diagnosis not present

## 2023-04-15 DIAGNOSIS — I10 Essential (primary) hypertension: Secondary | ICD-10-CM | POA: Diagnosis not present

## 2023-04-15 DIAGNOSIS — R519 Headache, unspecified: Secondary | ICD-10-CM | POA: Diagnosis not present

## 2023-04-15 DIAGNOSIS — F319 Bipolar disorder, unspecified: Secondary | ICD-10-CM | POA: Diagnosis not present

## 2023-04-15 DIAGNOSIS — R4182 Altered mental status, unspecified: Secondary | ICD-10-CM | POA: Diagnosis not present

## 2023-04-15 DIAGNOSIS — Z818 Family history of other mental and behavioral disorders: Secondary | ICD-10-CM

## 2023-04-15 DIAGNOSIS — L03116 Cellulitis of left lower limb: Secondary | ICD-10-CM | POA: Diagnosis not present

## 2023-04-15 DIAGNOSIS — R569 Unspecified convulsions: Secondary | ICD-10-CM | POA: Diagnosis not present

## 2023-04-15 DIAGNOSIS — L4050 Arthropathic psoriasis, unspecified: Secondary | ICD-10-CM | POA: Diagnosis not present

## 2023-04-15 DIAGNOSIS — F1010 Alcohol abuse, uncomplicated: Secondary | ICD-10-CM | POA: Diagnosis not present

## 2023-04-15 DIAGNOSIS — I428 Other cardiomyopathies: Secondary | ICD-10-CM | POA: Diagnosis not present

## 2023-04-15 DIAGNOSIS — E871 Hypo-osmolality and hyponatremia: Principal | ICD-10-CM | POA: Diagnosis present

## 2023-04-15 DIAGNOSIS — F17210 Nicotine dependence, cigarettes, uncomplicated: Secondary | ICD-10-CM | POA: Diagnosis present

## 2023-04-15 DIAGNOSIS — I480 Paroxysmal atrial fibrillation: Secondary | ICD-10-CM | POA: Diagnosis not present

## 2023-04-15 DIAGNOSIS — R609 Edema, unspecified: Secondary | ICD-10-CM | POA: Diagnosis not present

## 2023-04-15 DIAGNOSIS — R0602 Shortness of breath: Secondary | ICD-10-CM | POA: Diagnosis not present

## 2023-04-15 DIAGNOSIS — Z7982 Long term (current) use of aspirin: Secondary | ICD-10-CM

## 2023-04-15 DIAGNOSIS — G40909 Epilepsy, unspecified, not intractable, without status epilepticus: Secondary | ICD-10-CM | POA: Diagnosis not present

## 2023-04-15 DIAGNOSIS — E222 Syndrome of inappropriate secretion of antidiuretic hormone: Principal | ICD-10-CM | POA: Diagnosis present

## 2023-04-15 DIAGNOSIS — Z888 Allergy status to other drugs, medicaments and biological substances status: Secondary | ICD-10-CM

## 2023-04-15 DIAGNOSIS — Z79899 Other long term (current) drug therapy: Secondary | ICD-10-CM | POA: Diagnosis not present

## 2023-04-15 DIAGNOSIS — R17 Unspecified jaundice: Secondary | ICD-10-CM | POA: Diagnosis not present

## 2023-04-15 DIAGNOSIS — F312 Bipolar disorder, current episode manic severe with psychotic features: Secondary | ICD-10-CM | POA: Diagnosis not present

## 2023-04-15 DIAGNOSIS — S81801A Unspecified open wound, right lower leg, initial encounter: Secondary | ICD-10-CM

## 2023-04-15 DIAGNOSIS — F3110 Bipolar disorder, current episode manic without psychotic features, unspecified: Secondary | ICD-10-CM | POA: Diagnosis not present

## 2023-04-15 DIAGNOSIS — K7689 Other specified diseases of liver: Secondary | ICD-10-CM | POA: Diagnosis not present

## 2023-04-15 DIAGNOSIS — F1021 Alcohol dependence, in remission: Secondary | ICD-10-CM | POA: Diagnosis not present

## 2023-04-15 DIAGNOSIS — R000 Tachycardia, unspecified: Secondary | ICD-10-CM | POA: Diagnosis not present

## 2023-04-15 LAB — CBC WITH DIFFERENTIAL/PLATELET
Abs Immature Granulocytes: 0.06 10*3/uL (ref 0.00–0.07)
Basophils Absolute: 0 10*3/uL (ref 0.0–0.1)
Basophils Relative: 0 %
Eosinophils Absolute: 0.1 10*3/uL (ref 0.0–0.5)
Eosinophils Relative: 1 %
HCT: 35.2 % — ABNORMAL LOW (ref 39.0–52.0)
Hemoglobin: 13 g/dL (ref 13.0–17.0)
Immature Granulocytes: 1 %
Lymphocytes Relative: 7 %
Lymphs Abs: 0.8 10*3/uL (ref 0.7–4.0)
MCH: 33.8 pg (ref 26.0–34.0)
MCHC: 36.9 g/dL — ABNORMAL HIGH (ref 30.0–36.0)
MCV: 91.4 fL (ref 80.0–100.0)
Monocytes Absolute: 0.7 10*3/uL (ref 0.1–1.0)
Monocytes Relative: 7 %
Neutro Abs: 8.8 10*3/uL — ABNORMAL HIGH (ref 1.7–7.7)
Neutrophils Relative %: 84 %
Platelets: 303 10*3/uL (ref 150–400)
RBC: 3.85 MIL/uL — ABNORMAL LOW (ref 4.22–5.81)
RDW: 11.9 % (ref 11.5–15.5)
WBC: 10.4 10*3/uL (ref 4.0–10.5)
nRBC: 0 % (ref 0.0–0.2)

## 2023-04-15 LAB — CBG MONITORING, ED: Glucose-Capillary: 77 mg/dL (ref 70–99)

## 2023-04-15 LAB — COMPREHENSIVE METABOLIC PANEL WITH GFR
ALT: 41 U/L (ref 0–44)
AST: 96 U/L — ABNORMAL HIGH (ref 15–41)
Albumin: 3.5 g/dL (ref 3.5–5.0)
Alkaline Phosphatase: 89 U/L (ref 38–126)
Anion gap: 13 (ref 5–15)
BUN: <5 mg/dL — ABNORMAL LOW (ref 6–20)
CO2: 23 mmol/L (ref 22–32)
Calcium: 8.5 mg/dL — ABNORMAL LOW (ref 8.9–10.3)
Chloride: 72 mmol/L — ABNORMAL LOW (ref 98–111)
Creatinine, Ser: 0.44 mg/dL — ABNORMAL LOW (ref 0.61–1.24)
GFR, Estimated: >60 mL/min (ref >60)
Glucose, Bld: 80 mg/dL (ref 70–99)
Potassium: 4.6 mmol/L (ref 3.5–5.1)
Sodium: 108 mmol/L — CL (ref 135–145)
Total Bilirubin: 1.3 mg/dL — ABNORMAL HIGH (ref 0.0–1.2)
Total Protein: 6.6 g/dL (ref 6.5–8.1)

## 2023-04-15 LAB — URINALYSIS, ROUTINE W REFLEX MICROSCOPIC
Bilirubin Urine: NEGATIVE
Glucose, UA: NEGATIVE mg/dL
Hgb urine dipstick: NEGATIVE
Ketones, ur: 80 mg/dL — AB
Leukocytes,Ua: NEGATIVE
Nitrite: NEGATIVE
Protein, ur: NEGATIVE mg/dL
Specific Gravity, Urine: 1.01 (ref 1.005–1.030)
pH: 6 (ref 5.0–8.0)

## 2023-04-15 LAB — I-STAT CG4 LACTIC ACID, ED
Lactic Acid, Venous: 1 mmol/L (ref 0.5–1.9)
Lactic Acid, Venous: 0.4 mmol/L — ABNORMAL LOW (ref 0.5–1.9)

## 2023-04-15 LAB — PROTIME-INR
INR: 1 (ref 0.8–1.2)
Prothrombin Time: 13.2 s (ref 11.4–15.2)

## 2023-04-15 LAB — ETHANOL: Alcohol, Ethyl (B): <10 mg/dL (ref <10)

## 2023-04-15 LAB — CK: Total CK: 1709 U/L — ABNORMAL HIGH (ref 49–397)

## 2023-04-15 LAB — TROPONIN I (HIGH SENSITIVITY)
Troponin I (High Sensitivity): 12 ng/L (ref <18)
Troponin I (High Sensitivity): 13 ng/L (ref <18)

## 2023-04-15 LAB — BRAIN NATRIURETIC PEPTIDE: B Natriuretic Peptide: 185.3 pg/mL — ABNORMAL HIGH (ref 0.0–100.0)

## 2023-04-15 LAB — APTT: aPTT: 43 s — ABNORMAL HIGH (ref 24–36)

## 2023-04-15 MED ORDER — LORAZEPAM 1 MG PO TABS
1.0000 mg | ORAL_TABLET | ORAL | Status: AC | PRN
  Administered 2023-04-16: 1 mg via ORAL
  Filled 2023-04-15: qty 1

## 2023-04-15 MED ORDER — LORAZEPAM 2 MG/ML IJ SOLN
1.0000 mg | INTRAMUSCULAR | Status: AC | PRN
  Administered 2023-04-15: 2 mg via INTRAVENOUS
  Filled 2023-04-15: qty 1

## 2023-04-15 MED ORDER — THIAMINE MONONITRATE 100 MG PO TABS
100.0000 mg | ORAL_TABLET | Freq: Every day | ORAL | Status: DC
  Administered 2023-04-15 – 2023-04-23 (×9): 100 mg via ORAL
  Filled 2023-04-15 (×9): qty 1

## 2023-04-15 MED ORDER — SODIUM CHLORIDE 0.9 % IV SOLN
2.0000 g | Freq: Once | INTRAVENOUS | Status: AC
  Administered 2023-04-15: 2 g via INTRAVENOUS
  Filled 2023-04-15: qty 20

## 2023-04-15 MED ORDER — THIAMINE HCL 100 MG/ML IJ SOLN
100.0000 mg | Freq: Every day | INTRAMUSCULAR | Status: DC
  Filled 2023-04-15 (×2): qty 2

## 2023-04-15 MED ORDER — ADULT MULTIVITAMIN W/MINERALS CH
1.0000 | ORAL_TABLET | Freq: Every day | ORAL | Status: DC
  Administered 2023-04-15 – 2023-04-23 (×9): 1 via ORAL
  Filled 2023-04-15 (×9): qty 1

## 2023-04-15 MED ORDER — FOLIC ACID 1 MG PO TABS
1.0000 mg | ORAL_TABLET | Freq: Every day | ORAL | Status: DC
Start: 2023-04-15 — End: 2023-04-23
  Administered 2023-04-15 – 2023-04-23 (×9): 1 mg via ORAL
  Filled 2023-04-15 (×9): qty 1

## 2023-04-15 NOTE — ED Notes (Signed)
 Patient transported to X-ray

## 2023-04-15 NOTE — ED Provider Notes (Incomplete)
  Physical Exam  BP (!) 145/59   Pulse 77   Temp 98.2 F (36.8 C) (Oral)   Resp 20   SpO2 100%   Physical Exam  Procedures  .Critical Care  Performed by: Paris Lore, PA-C Authorized by: Paris Lore, PA-C   Critical care provider statement:    Critical care time (minutes):  45   Critical care was time spent personally by me on the following activities:  Development of treatment plan with patient or surrogate, discussions with consultants, evaluation of patient's response to treatment, examination of patient, obtaining history from patient or surrogate, ordering and performing treatments and interventions, ordering and review of laboratory studies, ordering and review of radiographic studies, pulse oximetry and re-evaluation of patient's condition   ED Course / MDM   Clinical Course as of 04/15/23 2337  Thu Apr 15, 2023  2337 PCCM repaged by secretary at my request, due to consult pending >  1 hr.  [RS]    Clinical Course User Index [RS] Camelle Henkels, Eugene Gavia, PA-C   Medical Decision Making Amount and/or Complexity of Data Reviewed Labs: ordered.    Details: Labs remarkable for critical hyponatremia of 108, glucose 80.  CK of 1700, troponin is normal, INR is unremarkable patient will require ICU admission for hyponatremia..   Radiology: ordered.  Risk OTC drugs. Prescription drug management.   ***  58 y/o male with hx of ETOH use disorder (reports drinking at least a 6 pack of beer daily) presents with BLE with weeping, AMS. States he is supposed to be taking salt tabs but unclear if he is. Patient signed out pending labs.   Labs remarkable for critical hyponatremia of 108, glucose 80. patient will require ICU admission for hyponatremia.Marland Kitchen  History of admission for natremia 118 with concomitant cellulitis of the lower extremities and December for acute on chronic hyponatremia.  Patient without evidence of seizure activity, he is not obtunded, ANO x 4  though strange and flat affect. Neurologically nonfocal. Pending PCCM consult at this time.

## 2023-04-15 NOTE — ED Provider Notes (Signed)
 Billy Anderson EMERGENCY DEPARTMENT AT [facility] Provider Note   CSN: [id number] Arrival date & time: 04/15/23  2022     History  Chief Complaint  Patient presents with   Leg Swelling    Billy Anderson is a 58 y.o. male from chart review with h/o alcohol use disorder, psoriatic arthritis, bipolar, hypertension, seizures presents to the ER today for evaluation of lower leg swelling and weeping for the past few days.  He reports that his significant other at home wrapped his leg for the past 2 to 3 days because of the weeping.  Has not changed the bandage since.  Denies any fevers.  Reports some bilateral lower extremity pain.  He denies any chest pain or shortness of breath however the patient does look slightly tachypneic.  Patient reports "I am a lot of medications for a lot of different things".  He is unsure of what other medications he is on.  He is able to tell me that he is on a blood pressure medication however does not know the name and is also on a salt tab.  When asking and he manages these medications he was unsure if it was the hospital where he was discharged or his primary care provider.  He cannot provide me a timeline of when he last saw his PCP.  Reports daily tobacco use of 1 pack/day.  Reports that he drinks around a sixpack on the weekends.  Last alcohol consumption was last weekend.  Reports he is no longer taking Humira.   HPI     Home Medications Prior to Admission medications   Medication Sig Start Date End Date Taking? Authorizing Provider  aspirin EC 81 MG tablet Take 1 tablet (81 mg total) by mouth daily. 10/31/21   Love, Evlyn Kanner, PA-C  Calcium Carb-Cholecalciferol (CALCIUM 600+D3 PO) Take 1 tablet by mouth 2 (two) times daily.    [provider]  citalopram (CELEXA) 10 MG tablet Take 1 tablet (10 mg total) by mouth at bedtime. 11/11/22   Arfeen, Phillips Grout, MD  diltiazem (CARDIZEM CD) 180 MG 24 hr capsule TAKE 2 CAPSULES BY MOUTH DAILY 02/08/23    Paseda, Baird Kay, FNP  docusate sodium (COLACE) 100 MG capsule Take 1 capsule (100 mg total) by mouth 2 (two) times daily. 01/21/23   Rolly Salter, MD  Fexofenadine HCl (ALLEGRA PO) Take 180 mg by mouth daily.    [provider]  folic acid (FOLVITE) 1 MG tablet Take 1 tablet (1 mg total) by mouth daily. 01/26/23   Paseda, Baird Kay, FNP  gabapentin (NEURONTIN) 100 MG capsule Take 1 capsule (100 mg total) by mouth 2 (two) times daily for 10 days. 01/26/23 02/05/23  Paseda, Baird Kay, FNP  HUMIRA, 2 PEN, 40 MG/0.8ML AJKT pen Inject 0.8 mLs into the skin every 14 (fourteen) days. Patient not taking: Reported on 01/26/2023 01/01/23   [provider]  lamoTRIgine (LAMICTAL) 100 MG tablet TAKE TWO TABLETS BY MOUTH AT BEDTIME 02/01/23   Windell Norfolk, MD  Multiple Vitamin (MULTIVITAMIN) tablet Take 1 tablet by mouth daily. Patient taking differently: Take 1 tablet by mouth in the morning and at bedtime. 10/22/21   Almon Hercules, MD  naltrexone (DEPADE) 50 MG tablet Take 1 tablet (50 mg total) by mouth daily. Patient taking differently: Take 50 mg by mouth at bedtime. 11/11/22   Arfeen, Phillips Grout, MD  OLANZapine (ZYPREXA) 20 MG tablet Take 1 tablet (20 mg total) by mouth at bedtime.  11/11/22   Arfeen, Phillips Grout, MD  oxybutynin (DITROPAN-XL) 10 MG 24 hr tablet TAKE 1 TABLET BY MOUTH AT BEDTIME 02/01/23   Paseda, Baird Kay, FNP  pantoprazole (PROTONIX) 40 MG tablet Take 1 tablet (40 mg total) by mouth daily. 03/17/23   Donell Beers, FNP  pyridOXINE (VITAMIN B6) 100 MG tablet Take 1 tablet (100 mg total) by mouth daily. Patient taking differently: Take 100 mg by mouth in the morning and at bedtime. 10/31/21   Love, Evlyn Kanner, PA-C  sodium chloride 1 g tablet Take 2 tablets (2 g total) by mouth 3 (three) times daily with meals. 01/26/23   Donell Beers, FNP  thiamine (VITAMIN B-1) 100 MG tablet Take 1 tablet (100 mg total) by mouth daily. 01/22/23   Rolly Salter, MD      Allergies     Ativan [lorazepam], Roxicodone [oxycodone], Ultram [tramadol], and Vistaril [hydroxyzine]    Review of Systems   Review of Systems  Constitutional:  Negative for chills and fever.  Respiratory:  Negative for shortness of breath.   Cardiovascular:  Negative for chest pain.  Skin:  Positive for wound.    Physical Exam Updated Vital Signs BP (!) 168/76   Pulse 78   Temp 98.2 F (36.8 C) (Oral)   Resp 17   SpO2 97%  Physical Exam Vitals and nursing note reviewed.  Constitutional:      Appearance: He is not toxic-appearing.  Eyes:     General: No scleral icterus. Cardiovascular:     Rate and Rhythm: Normal rate.  Pulmonary:     Comments: Appears slightly tachypneic however he denies any shortness of breath.  Satting well on room air.  Does have some coarse lung sounds bilaterally, likely due to patient's tobacco use history.  There is no accessory muscle use however. Abdominal:     Palpations: Abdomen is soft.     Tenderness: There is no abdominal tenderness. There is no guarding or rebound.  Musculoskeletal:     Right lower leg: Edema present.     Left lower leg: Edema present.     Comments: Left leg was wrapped in 2 layers of Ace bandage that were saturated and fluid, not blood.  The left leg does appear smaller than the right leg.  The right leg has some dry scaly skin however no increase in erythema.  Does have 1-2+ pitting edema present extending into the knee.  Palpable pulse, patient is able to move his toes.  For the left leg, he has erythema seen from ankle extending into the mid lower leg.  He has some blistering seen to the dorsum of the left foot as well as some deroofed blistering seen to the anterior shin.  Some active serous discharge from the area.  Compartments are firm but pliable.  There is no skin sloughing seen.  Skin:    General: Skin is warm.  Neurological:     Mental Status: He is alert.     Comments: Odd affect, but oriented and alert     ED Results /  Procedures / Treatments   Labs (all labs ordered are listed, but only abnormal results are displayed) Labs Reviewed  CBC WITH DIFFERENTIAL/PLATELET - Abnormal; Notable for the following components:      Result Value   RBC 3.85 (*)    HCT 35.2 (*)    MCHC 36.9 (*)    Neutro Abs 8.8 (*)    All other components within normal limits  CULTURE, BLOOD (ROUTINE X 2)  CULTURE, BLOOD (ROUTINE X 2)  COMPREHENSIVE METABOLIC PANEL WITH GFR  PROTIME-INR  APTT  BRAIN NATRIURETIC PEPTIDE  ETHANOL  CK  I-STAT CG4 LACTIC ACID, ED  TROPONIN I (HIGH SENSITIVITY)    EKG None  Radiology No results found.  Procedures Procedures   Medications Ordered in ED Medications  LORazepam (ATIVAN) tablet 1-4 mg ( Oral See Alternative 04/15/23 2125)    Or  LORazepam (ATIVAN) injection 1-4 mg (2 mg Intravenous Given 04/15/23 2125)  thiamine (VITAMIN B1) tablet 100 mg (100 mg Oral Given 04/15/23 2125)    Or  thiamine (VITAMIN B1) injection 100 mg ( Intravenous See Alternative 04/15/23 2125)  folic acid (FOLVITE) tablet 1 mg (1 mg Oral Given 04/15/23 2125)  multivitamin with minerals tablet 1 tablet (1 tablet Oral Given 04/15/23 2125)    ED Course/ Medical Decision Making/ A&P                               Medical Decision Making Amount and/or Complexity of Data Reviewed Labs: ordered. Radiology: ordered.  Risk OTC drugs. Prescription drug management.   58 y.o. male presents to the ER for evaluation of lower leg edema. Differential diagnosis includes but is not limited to cellulitis versus DVT versus peripheral edema. Vital signs elevated blood pressure 168/76 otherwise unremarkable. Physical exam as noted above.   On previous chart review, patient was admitted at the very end of December for similar presentation.  Had a sodium of 118.  Was concern for some beer drinker [place] mania.  I also see mention that he was on Humira for psoriatic arthritis.  Patient reports that he has not had this since  December given his change in insurance.  He does not feel like his arthritis is worsening since being off of it.  I do see a posthospital visit note from 1 - 7 - 25 after he was admitted for the cellulitis.  I also see mention in his primary care visit about paroxysmal A-fib HCC continue on Cardizem and aspirin.  He does have mention of hyponatremia at this time as well as his to be taking 2 g of salt tab 3 times daily.  He denies daily alcohol use.  Reports his last drink was this weekend it was a sixpack.  I do see alcohol use disorder mentioned in his previous charts.  Patient is not super familiar with the medications and what he takes.  I am unsure of compliancy.  Patient does appear mildly tremulous throughout, no focal shaking.  Is alert and awake.  Will add on CIWA protocol for extra precaution.  Patient is not meeting SIRS criteria with vital signs however will order lactic and blood cultures due to presentation.  I independently reviewed and interpreted the patient's labs.  CBC with a white count of 10.4 however does have a left shift at 8.8.  Mildly decreased hematocrit however hemoglobin within normal limits.  Lactic acid normal at 1.0.  Ethanol less than 10. CMP, CK, PT/INR, troponin, ABG, BNP, troponin pending.  Chest x-ray pending.  EKG obtained.  Will handoff to oncoming shift to follow-up with labs.  Rocephin given for cellulitis.  Patient does have 2+ pitting edema with weeping wounds with clear fluid.  Compartments are pliable.  Suspicion for any compartment syndrome.  There is an increase in erythema however temperature appear symmetric.  He has palpable DP and PT pulses bilaterally.  11:15 PM Care of Christiano Blandon Anderson transferred to PA Rebekah Sponseller at the end of my shift as the patient will require reassessment once labs/imaging have resulted. Patient presentation, ED course, and plan of care discussed with review of all pertinent labs and imaging. Please see his/her note for  further details regarding further ED course and disposition. Plan at time of handoff is follow up with labs and imaging. This may be altered or completely changed at the discretion of the oncoming team pending results of further workup.  Portions of this report may have been transcribed using voice recognition software. Every effort was made to ensure accuracy; however, inadvertent computerized transcription errors may be present.   Final Clinical Impression(s) / ED Diagnoses Final diagnoses:  None    Rx / DC Orders ED Discharge Orders     None         Achille Rich, PA-C 04/15/23 2320    Vanetta Mulders, MD 04/16/23 2308

## 2023-04-15 NOTE — ED Triage Notes (Signed)
 Patient arrived with complaints of bilateral leg swelling that started yesterday. Reports weeping, red and hot to touch. Hypertensive in triage, did take his medication just prior to arrival.

## 2023-04-15 NOTE — ED Provider Notes (Signed)
 Physical Exam  BP (!) 145/59   Pulse 77   Temp 98.2 F (36.8 C) (Oral)   Resp 20   SpO2 100%   Physical Exam Vitals and nursing note reviewed.  Constitutional:      Appearance: He is not toxic-appearing.  HENT:     Head: Normocephalic and atraumatic.  Eyes:     General: No scleral icterus.       Right eye: No discharge.        Left eye: No discharge.     Conjunctiva/sclera: Conjunctivae normal.  Pulmonary:     Effort: Pulmonary effort is normal.  Skin:    General: Skin is warm and dry.     Comments: Bilateral lower extremity skin changes, see preceding ED provider note for further insight.  Neurological:     General: No focal deficit present.     Mental Status: He is alert and oriented to person, place, and time.     GCS: GCS eye subscore is 4. GCS verbal subscore is 4. GCS motor subscore is 6.     Cranial Nerves: Cranial nerves 2-12 are intact.     Sensory: Sensation is intact.     Motor: Motor function is intact.     Coordination: Coordination is intact.     Comments: Gait deferred for patient safety.  Psychiatric:        Mood and Affect: Mood normal.     Procedures  .Critical Care  Performed by: Paris Lore, PA-C Authorized by: Paris Lore, PA-C   Critical care provider statement:    Critical care time (minutes):  45   Critical care was time spent personally by me on the following activities:  Development of treatment plan with patient or surrogate, discussions with consultants, evaluation of patient's response to treatment, examination of patient, obtaining history from patient or surrogate, ordering and performing treatments and interventions, ordering and review of laboratory studies, ordering and review of radiographic studies, pulse oximetry and re-evaluation of patient's condition   ED Course / MDM   Clinical Course as of 04/16/23 0023  Thu Apr 15, 2023  2337 PCCM repaged by secretary at my request, due to consult pending >  1 hr.   [RS]  Fri Apr 16, 2023  0007 Consult to Dr. Gaynell Face, PCCM, who recommends initiation of NS bolus 500 cc at 100cc/hr. She will consult on the patient in the ED and admit to her service. I appreciate her collaboration in the care of this patient.  [RS]    Clinical Course User Index [RS] Kalup Jaquith, Eugene Gavia, PA-C   Medical Decision Making Amount and/or Complexity of Data Reviewed Labs: ordered.    Details: Labs remarkable for critical hyponatremia of 108, glucose 80.  CK of 1700, troponin is normal, INR is unremarkable patient will require ICU admission for hyponatremia..   Radiology: ordered.  Risk OTC drugs. Prescription drug management. Decision regarding hospitalization.    Care of this patient assumed from preceding ED provider Achille Rich, PA-C at time of shift change.  In brief, 58 y/o male with hx of ETOH use disorder (reports drinking at least a 6 pack of beer daily) presents with BLE with weeping, AMS. States he is supposed to be taking salt tabs but unclear if he is.  Patient per preceding ED provider to have cellulitic changes of the left lower extremity for which she was initiated on Rocephin, however patient signed out pending labs.   Labs remarkable for critical hyponatremia of 108, glucose  80. Patient will require ICU admission for hyponatremia.  This does appear to be an acute on chronic presentation given history of admission for natremia 118 with concomitant cellulitis of the lower extremities in December.  Concern in the past for beer potomania. Patient without evidence of seizure activity, he is not obtunded, ANO x 4 though strange and flat affect. Neurologically nonfocal. Pending PCCM consult at this time.   Consult to Dr. Gaynell Face as above, proceeding with NS bolus of 500 cc at 100 cc/h per her recommendation.  Patient remains hemodynamically stable with GCS of 14.  [name] voiced understanding of his medical evaluation and treatment plan. Each of their questions  answered to their expressed satisfaction.  He is amenable to the plan for admission to ICU. He is on CIWA protocol.   NA uptrending after bolus, PCCM Dr. Gaynell Face at the bedside. Patient remains hemodynamically stable, GCS of 14, mildly confused.   This chart was dictated using voice recognition software, Dragon. Despite the best efforts of this provider to proofread and correct errors, errors may still occur which can change documentation meaning.    Paris Lore, PA-C 04/16/23 0443    Gilda Crease, MD 04/16/23 [phone]

## 2023-04-16 ENCOUNTER — Emergency Department (HOSPITAL_COMMUNITY)

## 2023-04-16 DIAGNOSIS — E871 Hypo-osmolality and hyponatremia: Secondary | ICD-10-CM

## 2023-04-16 DIAGNOSIS — L03116 Cellulitis of left lower limb: Secondary | ICD-10-CM | POA: Diagnosis not present

## 2023-04-16 DIAGNOSIS — Z79899 Other long term (current) drug therapy: Secondary | ICD-10-CM | POA: Diagnosis not present

## 2023-04-16 DIAGNOSIS — F1010 Alcohol abuse, uncomplicated: Secondary | ICD-10-CM | POA: Diagnosis present

## 2023-04-16 DIAGNOSIS — R17 Unspecified jaundice: Secondary | ICD-10-CM | POA: Diagnosis not present

## 2023-04-16 DIAGNOSIS — L97529 Non-pressure chronic ulcer of other part of left foot with unspecified severity: Secondary | ICD-10-CM | POA: Diagnosis present

## 2023-04-16 DIAGNOSIS — I480 Paroxysmal atrial fibrillation: Secondary | ICD-10-CM | POA: Diagnosis not present

## 2023-04-16 DIAGNOSIS — F319 Bipolar disorder, unspecified: Secondary | ICD-10-CM | POA: Diagnosis present

## 2023-04-16 DIAGNOSIS — Z885 Allergy status to narcotic agent status: Secondary | ICD-10-CM | POA: Diagnosis not present

## 2023-04-16 DIAGNOSIS — F1021 Alcohol dependence, in remission: Secondary | ICD-10-CM | POA: Diagnosis not present

## 2023-04-16 DIAGNOSIS — R569 Unspecified convulsions: Secondary | ICD-10-CM | POA: Diagnosis present

## 2023-04-16 DIAGNOSIS — R4182 Altered mental status, unspecified: Secondary | ICD-10-CM | POA: Diagnosis not present

## 2023-04-16 DIAGNOSIS — R519 Headache, unspecified: Secondary | ICD-10-CM | POA: Diagnosis not present

## 2023-04-16 DIAGNOSIS — L4050 Arthropathic psoriasis, unspecified: Secondary | ICD-10-CM | POA: Diagnosis not present

## 2023-04-16 DIAGNOSIS — E222 Syndrome of inappropriate secretion of antidiuretic hormone: Secondary | ICD-10-CM | POA: Diagnosis present

## 2023-04-16 DIAGNOSIS — F3110 Bipolar disorder, current episode manic without psychotic features, unspecified: Secondary | ICD-10-CM | POA: Diagnosis not present

## 2023-04-16 DIAGNOSIS — I10 Essential (primary) hypertension: Secondary | ICD-10-CM

## 2023-04-16 DIAGNOSIS — F17210 Nicotine dependence, cigarettes, uncomplicated: Secondary | ICD-10-CM | POA: Diagnosis present

## 2023-04-16 DIAGNOSIS — Z981 Arthrodesis status: Secondary | ICD-10-CM | POA: Diagnosis not present

## 2023-04-16 DIAGNOSIS — E878 Other disorders of electrolyte and fluid balance, not elsewhere classified: Secondary | ICD-10-CM | POA: Diagnosis present

## 2023-04-16 DIAGNOSIS — F312 Bipolar disorder, current episode manic severe with psychotic features: Secondary | ICD-10-CM | POA: Diagnosis not present

## 2023-04-16 DIAGNOSIS — M6282 Rhabdomyolysis: Secondary | ICD-10-CM | POA: Diagnosis present

## 2023-04-16 DIAGNOSIS — K7689 Other specified diseases of liver: Secondary | ICD-10-CM | POA: Diagnosis not present

## 2023-04-16 DIAGNOSIS — Z7982 Long term (current) use of aspirin: Secondary | ICD-10-CM | POA: Diagnosis not present

## 2023-04-16 DIAGNOSIS — R000 Tachycardia, unspecified: Secondary | ICD-10-CM | POA: Diagnosis not present

## 2023-04-16 DIAGNOSIS — G40909 Epilepsy, unspecified, not intractable, without status epilepticus: Secondary | ICD-10-CM | POA: Diagnosis not present

## 2023-04-16 DIAGNOSIS — Z818 Family history of other mental and behavioral disorders: Secondary | ICD-10-CM | POA: Diagnosis not present

## 2023-04-16 DIAGNOSIS — Z888 Allergy status to other drugs, medicaments and biological substances status: Secondary | ICD-10-CM | POA: Diagnosis not present

## 2023-04-16 DIAGNOSIS — I428 Other cardiomyopathies: Secondary | ICD-10-CM | POA: Diagnosis not present

## 2023-04-16 DIAGNOSIS — K219 Gastro-esophageal reflux disease without esophagitis: Secondary | ICD-10-CM | POA: Diagnosis present

## 2023-04-16 LAB — OSMOLALITY, URINE
Osmolality, Ur: 317 mosm/kg (ref 300–900)
Osmolality, Ur: 311 mosm/kg (ref 300–900)

## 2023-04-16 LAB — CBC
HCT: 36.1 % — ABNORMAL LOW (ref 39.0–52.0)
Hemoglobin: 13.3 g/dL (ref 13.0–17.0)
MCH: 33.9 pg (ref 26.0–34.0)
MCHC: 36.8 g/dL — ABNORMAL HIGH (ref 30.0–36.0)
MCV: 92.1 fL (ref 80.0–100.0)
Platelets: 288 10*3/uL (ref 150–400)
RBC: 3.92 MIL/uL — ABNORMAL LOW (ref 4.22–5.81)
RDW: 11.9 % (ref 11.5–15.5)
WBC: 10.8 10*3/uL — ABNORMAL HIGH (ref 4.0–10.5)
nRBC: 0 % (ref 0.0–0.2)

## 2023-04-16 LAB — BASIC METABOLIC PANEL WITH GFR
Anion gap: 10 (ref 5–15)
Anion gap: 13 (ref 5–15)
BUN: <5 mg/dL — ABNORMAL LOW (ref 6–20)
BUN: <5 mg/dL — ABNORMAL LOW (ref 6–20)
CO2: 23 mmol/L (ref 22–32)
CO2: 22 mmol/L (ref 22–32)
Calcium: 7.6 mg/dL — ABNORMAL LOW (ref 8.9–10.3)
Calcium: 8.2 mg/dL — ABNORMAL LOW (ref 8.9–10.3)
Chloride: 76 mmol/L — ABNORMAL LOW (ref 98–111)
Chloride: 74 mmol/L — ABNORMAL LOW (ref 98–111)
Creatinine, Ser: 0.41 mg/dL — ABNORMAL LOW (ref 0.61–1.24)
Creatinine, Ser: 0.48 mg/dL — ABNORMAL LOW (ref 0.61–1.24)
GFR, Estimated: >60 mL/min (ref >60)
GFR, Estimated: >60 mL/min (ref >60)
Glucose, Bld: 128 mg/dL — ABNORMAL HIGH (ref 70–99)
Glucose, Bld: 93 mg/dL (ref 70–99)
Potassium: 3.5 mmol/L (ref 3.5–5.1)
Potassium: 4 mmol/L (ref 3.5–5.1)
Sodium: 109 mmol/L — CL (ref 135–145)
Sodium: 109 mmol/L — CL (ref 135–145)

## 2023-04-16 LAB — SODIUM
Sodium: 108 mmol/L — CL (ref 135–145)
Sodium: 109 mmol/L — CL (ref 135–145)
Sodium: 110 mmol/L — CL (ref 135–145)
Sodium: 116 mmol/L — CL (ref 135–145)

## 2023-04-16 LAB — SODIUM, URINE, RANDOM
Sodium, Ur: 37 mmol/L
Sodium, Ur: 62 mmol/L

## 2023-04-16 LAB — MRSA NEXT GEN BY PCR, NASAL: MRSA by PCR Next Gen: NOT DETECTED

## 2023-04-16 LAB — CORTISOL: Cortisol, Plasma: 11 ug/dL

## 2023-04-16 LAB — OSMOLALITY: Osmolality: 225 mosm/kg — CL (ref 275–295)

## 2023-04-16 LAB — TSH: TSH: 0.602 u[IU]/mL (ref 0.350–4.500)

## 2023-04-16 LAB — MAGNESIUM: Magnesium: 1.8 mg/dL (ref 1.7–2.4)

## 2023-04-16 LAB — HEPATITIS PANEL, ACUTE
HCV Ab: NONREACTIVE
Hep A IgM: NONREACTIVE
Hep B C IgM: NONREACTIVE
Hepatitis B Surface Ag: NONREACTIVE

## 2023-04-16 LAB — CK: Total CK: 1146 U/L — ABNORMAL HIGH (ref 49–397)

## 2023-04-16 MED ORDER — DEXTROSE 5 % IV BOLUS
250.0000 mL | Freq: Once | INTRAVENOUS | Status: AC
  Administered 2023-04-16: 250 mL via INTRAVENOUS

## 2023-04-16 MED ORDER — PANTOPRAZOLE SODIUM 40 MG PO TBEC
40.0000 mg | DELAYED_RELEASE_TABLET | Freq: Every day | ORAL | Status: DC
  Administered 2023-04-16 – 2023-04-19 (×4): 40 mg via ORAL
  Filled 2023-04-16 (×4): qty 1

## 2023-04-16 MED ORDER — DOCUSATE SODIUM 100 MG PO CAPS
100.0000 mg | ORAL_CAPSULE | Freq: Two times a day (BID) | ORAL | Status: DC | PRN
  Administered 2023-04-21: 100 mg via ORAL
  Filled 2023-04-16: qty 1

## 2023-04-16 MED ORDER — CHLORHEXIDINE GLUCONATE CLOTH 2 % EX PADS
6.0000 | MEDICATED_PAD | Freq: Every day | CUTANEOUS | Status: DC
  Administered 2023-04-16 – 2023-04-18 (×3): 6 via TOPICAL

## 2023-04-16 MED ORDER — ENOXAPARIN SODIUM 40 MG/0.4ML IJ SOSY
40.0000 mg | PREFILLED_SYRINGE | INTRAMUSCULAR | Status: DC
  Administered 2023-04-16 – 2023-04-23 (×8): 40 mg via SUBCUTANEOUS
  Filled 2023-04-16 (×8): qty 0.4

## 2023-04-16 MED ORDER — CEFAZOLIN SODIUM-DEXTROSE 2-4 GM/100ML-% IV SOLN
2.0000 g | Freq: Three times a day (TID) | INTRAVENOUS | Status: AC
  Administered 2023-04-16 – 2023-04-22 (×21): 2 g via INTRAVENOUS
  Filled 2023-04-16 (×21): qty 100

## 2023-04-16 MED ORDER — SODIUM CHLORIDE 0.9 % IV SOLN
INTRAVENOUS | Status: DC
Start: 2023-04-16 — End: 2023-04-16

## 2023-04-16 MED ORDER — CALCIUM GLUCONATE-NACL 2-0.675 GM/100ML-% IV SOLN
2.0000 g | Freq: Once | INTRAVENOUS | Status: AC
  Administered 2023-04-16: 2000 mg via INTRAVENOUS
  Filled 2023-04-16: qty 100

## 2023-04-16 MED ORDER — SODIUM CHLORIDE 0.9 % IV BOLUS
1000.0000 mL | Freq: Once | INTRAVENOUS | Status: AC
  Administered 2023-04-16: 1000 mL via INTRAVENOUS

## 2023-04-16 MED ORDER — ORAL CARE MOUTH RINSE: 15.0000 mL | OROMUCOSAL | Status: DC | PRN

## 2023-04-16 MED ORDER — POLYETHYLENE GLYCOL 3350 17 G PO PACK: 17.0000 g | PACK | Freq: Every day | ORAL | Status: DC | PRN

## 2023-04-16 MED ORDER — SODIUM CHLORIDE 1 G PO TABS
2.0000 g | ORAL_TABLET | Freq: Three times a day (TID) | ORAL | Status: DC
  Administered 2023-04-16: 2 g via ORAL
  Filled 2023-04-16: qty 2

## 2023-04-16 MED ORDER — SODIUM CHLORIDE 0.9 % IV BOLUS
500.0000 mL | Freq: Once | INTRAVENOUS | Status: AC
  Administered 2023-04-16: 500 mL via INTRAVENOUS

## 2023-04-16 NOTE — Progress Notes (Signed)
 eLink Physician-Brief Progress Note Patient Name: Maximo Spratling III DOB: 1965/05/31 MRN: [id number]   Date of Service  04/16/2023  HPI/Events of Note  Increase in serum sodium from 110 to 116 within 3 hours.  eICU Interventions  NS gtt + oral sodium chloride tablets discontinued. D5W 250 ml iv over 1 hour ordered, continue serial serum sodium checks.        Migdalia Dk 04/16/2023, 9:27 PM

## 2023-04-16 NOTE — ED Notes (Signed)
 ED TO INPATIENT HANDOFF REPORT  ED Nurse Name and Phone #: Majel [name], RN  S Name/Age/Gender Billy Anderson 58 y.o. male Room/Bed: WA14/WA14  Code Status   Code Status: Full Code  Home/SNF/Other Home Patient oriented to: self, place, time, and situation Is this baseline? Yes   Triage Complete: Triage complete  Chief Complaint Hyponatremia [E87.1]  Triage Note Patient arrived with complaints of bilateral leg swelling that started yesterday. Reports weeping, red and hot to touch. Hypertensive in triage, did take his medication just prior to arrival.    Allergies Allergies  Allergen Reactions   Ativan [Lorazepam] Other (See Comments)    Confusion  Hallucinations    Roxicodone [Oxycodone] Itching   Ultram [Tramadol] Other (See Comments)    Seizures    Vistaril [Hydroxyzine] Itching and Other (See Comments)    Sedation Not effective in reducing anxiety    Level of Care/Admitting Diagnosis ED Disposition     ED Disposition  Admit   Condition  --   Comment  Hospital Area: [facility] [[id number]]  Level of Care: ICU [6]  May admit patient to [facility] if equivalent level of care is available:: Yes  Covid Evaluation: Confirmed COVID Negative  Diagnosis: Hyponatremia [198519]  Admitting Physician: Briant Sites [[id number]]  Attending Physician: Briant Sites [[id number]]  Certification:: I certify this patient will need inpatient services for at least 2 midnights  Expected Medical Readiness: 04/19/2023          B Medical/Surgery History Past Medical History:  Diagnosis Date   Arthritis    psoriatic arthritis   Bipolar disorder (HCC)    Dysrhythmia    PAF   GERD (gastroesophageal reflux disease)    Hypertension    Pneumonia    Seizures (HCC)    Past Surgical History:  Procedure Laterality Date   ANTERIOR CERVICAL DECOMP/DISCECTOMY FUSION N/A 03/20/2022   Procedure: Cervical Three-Four, Cervical Four-Five Anterior  Cervical Discectomy Fusion;  Surgeon: Coletta Memos, MD;  Location: [facility] OR;  Service: Neurosurgery;  Laterality: N/A;  RM 20 to follow 3C   HAND SURGERY Left    carpal tunnel release     A IV Location/Drains/Wounds Patient Lines/Drains/Airways Status     Active Line/Drains/Airways     Name Placement date Placement time Site Days   Peripheral IV 04/15/23 20 G 1" Anterior;Distal;Right;Upper Arm 04/15/23  2100  Arm  1   Peripheral IV 04/16/23 22 G Posterior;Right Hand 04/16/23  0124  Hand  less than 1   Wound / Incision (Open or Dehisced) 01/21/23  Pretibial Circumferential;Bilateral Red/weeping areas & dry/crusted/flaky areas scattered on BLE 01/21/23  0945  Pretibial  85            Intake/Output Last 24 hours  Intake/Output Summary (Last 24 hours) at 04/16/2023 0348 Last data filed at 04/15/2023 2354 Gross per 24 hour  Intake --  Output 300 ml  Net -300 ml    Labs/Imaging Results for orders placed or performed during the hospital encounter of 04/15/23 (from the past 48 hours)  Comprehensive metabolic panel     Status: Abnormal   Collection Time: 04/15/23  9:05 PM  Result Value Ref Range   Sodium 108 (LL) 135 - 145 mmol/L    Comment: CRITICAL RESULT CALLED TO, READ BACK BY AND VERIFIED WITH JACKS, R RN @ 2200 ON 04/15/2023 BY MTA    Potassium 4.6 3.5 - 5.1 mmol/L   Chloride 72 (L) 98 - 111 mmol/L   CO2  23 22 - 32 mmol/L   Glucose, Bld 80 70 - 99 mg/dL    Comment: Glucose reference range applies only to samples taken after fasting for at least 8 hours.   BUN <5 (L) 6 - 20 mg/dL   Creatinine, Ser 8.29 (L) 0.61 - 1.24 mg/dL   Calcium 8.5 (L) 8.9 - 10.3 mg/dL   Total Protein 6.6 6.5 - 8.1 g/dL   Albumin 3.5 3.5 - 5.0 g/dL   AST 96 (H) 15 - 41 U/L   ALT 41 0 - 44 U/L   Alkaline Phosphatase 89 38 - 126 U/L   Total Bilirubin 1.3 (H) 0.0 - 1.2 mg/dL   GFR, Estimated >56 >21 mL/min    Comment: (NOTE) Calculated using the CKD-EPI Creatinine Equation (2021)    Anion gap 13  5 - 15    Comment: Performed at [facility], [address] [place], [place] [address]  CBC with Differential     Status: Abnormal   Collection Time: 04/15/23  9:05 PM  Result Value Ref Range   WBC 10.4 4.0 - 10.5 K/uL   RBC 3.85 (L) 4.22 - 5.81 MIL/uL   Hemoglobin 13.0 13.0 - 17.0 g/dL   HCT 78.4 (L) 69.6 - 29.5 %   MCV 91.4 80.0 - 100.0 fL   MCH 33.8 26.0 - 34.0 pg   MCHC 36.9 (H) 30.0 - 36.0 g/dL   RDW 28.4 13.2 - 44.0 %   Platelets 303 150 - 400 K/uL   nRBC 0.0 0.0 - 0.2 %   Neutrophils Relative % 84 %   Neutro Abs 8.8 (H) 1.7 - 7.7 K/uL   Lymphocytes Relative 7 %   Lymphs Abs 0.8 0.7 - 4.0 K/uL   Monocytes Relative 7 %   Monocytes Absolute 0.7 0.1 - 1.0 K/uL   Eosinophils Relative 1 %   Eosinophils Absolute 0.1 0.0 - 0.5 K/uL   Basophils Relative 0 %   Basophils Absolute 0.0 0.0 - 0.1 K/uL   Immature Granulocytes 1 %   Abs Immature Granulocytes 0.06 0.00 - 0.07 K/uL    Comment: Performed at [facility], [address] [place], [place] [address]  Protime-INR     Status: None   Collection Time: 04/15/23  9:05 PM  Result Value Ref Range   Prothrombin Time 13.2 11.4 - 15.2 seconds   INR 1.0 0.8 - 1.2    Comment: (NOTE) INR goal varies based on device and disease states. Performed at [facility], [address] [place], [place] [address]   APTT     Status: Abnormal   Collection Time: 04/15/23  9:05 PM  Result Value Ref Range   aPTT 43 (H) 24 - 36 seconds    Comment:        IF BASELINE aPTT IS ELEVATED, SUGGEST PATIENT RISK ASSESSMENT BE USED TO DETERMINE APPROPRIATE ANTICOAGULANT THERAPY. Performed at [facility], [address], [place], [place] [address]   Brain natriuretic peptide     Status: Abnormal   Collection Time: 04/15/23  9:05 PM  Result Value Ref Range   B Natriuretic Peptide 185.3 (H) 0.0 - 100.0 pg/mL    Comment: Performed at [facility], [address], [place], [place] [address]  Troponin I (High Sensitivity)     Status: None   Collection Time: 04/15/23  9:05 PM  Result Value Ref Range   Troponin I (High Sensitivity) 12 <18 ng/L  Comment: (NOTE) Elevated high sensitivity troponin I (hsTnI) values and significant  changes across serial measurements may suggest ACS but many other  chronic and acute conditions are known to elevate hsTnI results.  Refer to the "Links" section for chest pain algorithms and additional  guidance. Performed at [facility], [address], [place], [place] [address]   Ethanol     Status: None   Collection Time: 04/15/23  9:05 PM  Result Value Ref Range   Alcohol, Ethyl (B) <10 <10 mg/dL    Comment: (NOTE) Lowest detectable limit for serum alcohol is 10 mg/dL.  For medical purposes only. Performed at [facility], [address] [place], [place] [address]   CK     Status: Abnormal   Collection Time: 04/15/23  9:05 PM  Result Value Ref Range   Total CK 1,709 (H) 49 - 397 U/L    Comment: Performed at [facility], [address] [place], [place] [address]  Blood Culture (routine x 2)     Status: None (Preliminary result)   Collection Time: 04/15/23  9:15 PM   Specimen: BLOOD RIGHT HAND  Result Value Ref Range   Specimen Description      BLOOD RIGHT HAND Performed at [facility], [address] [place], [place] [address]    Special Requests      BOTTLES DRAWN AEROBIC AND ANAEROBIC Blood Culture results may not be optimal due to an inadequate volume of blood received in culture bottles Performed at [facility], [address], [place], [place] [address]    Culture PENDING    Report Status PENDING   I-Stat Lactic Acid, ED     Status: None   Collection Time: 04/15/23  9:16 PM  Result Value Ref Range   Lactic Acid, Venous 1.0 0.5 - 1.9 mmol/L  Troponin I (High Sensitivity)     Status: None   Collection  Time: 04/15/23 10:55 PM  Result Value Ref Range   Troponin I (High Sensitivity) 13 <18 ng/L    Comment: (NOTE) Elevated high sensitivity troponin I (hsTnI) values and significant  changes across serial measurements may suggest ACS but many other  chronic and acute conditions are known to elevate hsTnI results.  Refer to the "Links" section for chest pain algorithms and additional  guidance. Performed at [facility], [address], [place], [place] [address]   Osmolality     Status: Abnormal   Collection Time: 04/15/23 10:55 PM  Result Value Ref Range   Osmolality 225 (LL) 275 - 295 mOsm/kg    Comment: REPEATED TO VERIFY CRITICAL RESULT CALLED TO, READ BACK BY AND VERIFIED WITH: LACIVITA,H RN 04/16/2023 AT 0038 SKEEN,P Performed at [facility], [address], [place], [place] [address]   POC CBG, ED     Status: None   Collection Time: 04/15/23 10:59 PM  Result Value Ref Range   Glucose-Capillary 77 70 - 99 mg/dL    Comment: Glucose reference range applies only to samples taken after fasting for at least 8 hours.  I-Stat Lactic Acid, ED     Status: Abnormal   Collection Time: 04/15/23 11:07 PM  Result Value Ref Range   Lactic Acid, Venous 0.4 (L) 0.5 - 1.9 mmol/L  Urinalysis, Routine w reflex microscopic -Urine, Clean Catch     Status: Abnormal   Collection Time: 04/15/23 11:45 PM  Result Value Ref Range   Color, Urine YELLOW YELLOW  APPearance HAZY (A) CLEAR   Specific Gravity, Urine 1.010 1.005 - 1.030   pH 6.0 5.0 - 8.0   Glucose, UA NEGATIVE NEGATIVE mg/dL   Hgb urine dipstick NEGATIVE NEGATIVE   Bilirubin Urine NEGATIVE NEGATIVE   Ketones, ur 80 (A) NEGATIVE mg/dL   Protein, ur NEGATIVE NEGATIVE mg/dL   Nitrite NEGATIVE NEGATIVE   Leukocytes,Ua NEGATIVE NEGATIVE    Comment: Performed at [facility], [address] [place], [place] [address]  Basic metabolic panel     Status: Abnormal   Collection Time: 04/16/23  2:35  AM  Result Value Ref Range   Sodium 109 (LL) 135 - 145 mmol/L    Comment: CRITICAL RESULT CALLED TO, READ BACK BY AND VERIFIED WITH Annie Roseboom RN @ [phone] ON 04/16/2023 BY MTA    Potassium 3.5 3.5 - 5.1 mmol/L    Comment: DELTA CHECK NOTED   Chloride 76 (L) 98 - 111 mmol/L   CO2 23 22 - 32 mmol/L   Glucose, Bld 128 (H) 70 - 99 mg/dL    Comment: Glucose reference range applies only to samples taken after fasting for at least 8 hours.   BUN <5 (L) 6 - 20 mg/dL   Creatinine, Ser 7.82 (L) 0.61 - 1.24 mg/dL   Calcium 7.6 (L) 8.9 - 10.3 mg/dL   GFR, Estimated >95 >62 mL/min    Comment: (NOTE) Calculated using the CKD-EPI Creatinine Equation (2021)    Anion gap 10 5 - 15    Comment: Performed at [facility], [address] [place], [place] [address]   CT HEAD WO CONTRAST ( ) Result Date: 04/16/2023 CLINICAL DATA:  Hypertension and altered mental status, initial encounter EXAM: CT HEAD WITHOUT CONTRAST TECHNIQUE: Contiguous axial images were obtained from the base of the skull through the vertex without intravenous contrast. RADIATION DOSE REDUCTION: This exam was performed according to the departmental dose-optimization program which includes automated exposure control, adjustment of the mA and/or kV according to patient size and/or use of iterative reconstruction technique. COMPARISON:  None Available. FINDINGS: Brain: No evidence of acute infarction, hemorrhage, hydrocephalus, extra-axial collection or mass lesion/mass effect. Vascular: No hyperdense vessel or unexpected calcification. Skull: Normal. Negative for fracture or focal lesion. Sinuses/Orbits: No acute finding. Other: None. IMPRESSION: No acute intracranial abnormality noted. Electronically Signed   By: Alcide Clever M.D.   On: 04/16/2023 02:14   DG Chest 2 View Result Date: 04/15/2023 CLINICAL DATA:  Shortness of breath EXAM: CHEST - 2 VIEW COMPARISON:  Chest x-ray 01/19/2023 FINDINGS: The heart size and mediastinal  contours are within normal limits. Both lungs are clear. The visualized skeletal structures are unremarkable. IMPRESSION: No active cardiopulmonary disease. Electronically Signed   By: Darliss Cheney M.D.   On: 04/15/2023 21:36    Pending Labs Unresulted Labs (From admission, onward)     Start     Ordered   04/23/23 0500  Creatinine, serum  (enoxaparin (LOVENOX)    CrCl >/= 30 ml/min)  Weekly,   R     Comments: while on enoxaparin therapy    04/16/23 0345   04/16/23 0500  CBC  Tomorrow morning,   R        04/16/23 0345   04/16/23 0500  Basic metabolic panel  Tomorrow morning,   R        04/16/23 0345   04/16/23 0500  Magnesium  Tomorrow morning,   R        04/16/23 0345   04/16/23 0345  Sodium, urine, random  Once,   R        04/16/23 0345   04/16/23 0345  Sodium  Now then every 4 hours,   R      04/16/23 0345   04/16/23 0345  Osmolality, urine  Once,   R        04/16/23 0345   04/16/23 0343  CBC  (enoxaparin (LOVENOX)    CrCl >/= 30 ml/min)  Once,   R       Comments: Baseline for enoxaparin therapy IF NOT ALREADY DRAWN.  Notify MD if PLT < 100 K.    04/16/23 0345   04/16/23 0343  Creatinine, serum  (enoxaparin (LOVENOX)    CrCl >/= 30 ml/min)  Once,   R       Comments: Baseline for enoxaparin therapy IF NOT ALREADY DRAWN.    04/16/23 0345   04/15/23 2235  Lamotrigine level  Once,   URGENT        04/15/23 2234   04/15/23 2048  Blood Culture (routine x 2)  (Undifferentiated presentation (screening labs and basic nursing orders))  BLOOD CULTURE X 2,   STAT      04/15/23 2047            Vitals/Pain Today's Vitals   04/16/23 0300 04/16/23 0315 04/16/23 0330 04/16/23 0345  BP: (!) 148/75 (!) 127/98 (!) 143/63   Pulse: 73 73 74   Resp: 18 20 17    Temp:   98.1 F (36.7 C)   TempSrc:   Oral   SpO2: 100% 98% 99% 99%  PainSc:        Isolation Precautions No active isolations  Medications Medications  LORazepam (ATIVAN) tablet 1-4 mg (1 mg Oral Given 04/16/23 0021)     Or  LORazepam (ATIVAN) injection 1-4 mg ( Intravenous See Alternative 04/16/23 0021)  thiamine (VITAMIN B1) tablet 100 mg (100 mg Oral Given 04/15/23 2125)    Or  thiamine (VITAMIN B1) injection 100 mg ( Intravenous See Alternative 04/15/23 2125)  folic acid (FOLVITE) tablet 1 mg (1 mg Oral Given 04/15/23 2125)  multivitamin with minerals tablet 1 tablet (1 tablet Oral Given 04/15/23 2125)  0.9 %  sodium chloride infusion (has no administration in time range)  docusate sodium (COLACE) capsule 100 mg (has no administration in time range)  polyethylene glycol (MIRALAX / GLYCOLAX) packet 17 g (has no administration in time range)  enoxaparin (LOVENOX) injection 40 mg (has no administration in time range)  calcium gluconate 2 g/ 100 mL sodium chloride IVPB (has no administration in time range)  cefTRIAXone (ROCEPHIN) 2 g in sodium chloride 0.9 % 100 mL IVPB (0 g Intravenous Stopped 04/15/23 2346)  sodium chloride 0.9 % bolus 500 mL ( Intravenous Restarted 04/16/23 0124)    Mobility walks with person assist     Focused Assessments See Chart   R Recommendations: See Admitting Provider Note  Report given to:   Additional Notes:  See chart

## 2023-04-16 NOTE — ED Notes (Signed)
 Patient transported to CT

## 2023-04-16 NOTE — Progress Notes (Addendum)
 NAME:  Billy Anderson, MRN:  [id number], DOB:  1965-10-14, LOS: 0 ADMISSION DATE:  04/15/2023, CONSULTATION DATE:  04/16/23 REFERRING MD:  EDP, CHIEF COMPLAINT:  hyponatremia   History of Present Illness:  58 yo male presented 2/2 b/l le swelling and pain that began reportedly 2 days ago. He states that they have been weeping, and are warm to touch. Pt states he has not been eating or drinking well for past few days 2/2 dental issues. He endorses feeling weak and has been unable to move around much or clean his wounds on his le. Denies fever/chills/n/v/d. States he has been taking his medications as prescribed with exception of humira and salt tabs. He states other than weakness and le issues he denies any other complaints.   Pt also denies and etoh use since last weekend. He states he has not gone thru withdrawals for years.   Pertinent  Medical History  Etoh abuse Bipolar d/o Psoriatic arthritis Htn Chronic hyponatremia  Significant Hospital Events: Including procedures, antibiotic start and stop dates in addition to other pertinent events   3/28 Admitted to ICU for continued sodium checks  Interim History / Subjective:  Seen lying in bed with no acute complaints  Objective   Blood pressure (!) 149/78, pulse 69, temperature 99.5 F (37.5 C), temperature source Axillary, resp. rate 18, height 6' (1.829 m), SpO2 97%.        Intake/Output Summary (Last 24 hours) at 04/16/2023 0842 Last data filed at 04/16/2023 0735 Gross per 24 hour  Intake 527.96 ml  Output 350 ml  Net 177.96 ml   There were no vitals filed for this visit.  Examination: General: Acute on chronic ill-appearing deconditioned middle-aged male lying in bed in no acute distress HEENT: [place]/AT, MM pink/moist, PERRL,  Neuro: Alert and oriented x 3, nonfocal CV: s1s2 regular rate and rhythm, no murmur, rubs, or gallops,  PULM: Clear to auscultation bilaterally, no increased work of breathing, no added breath sounds,  on room air GI: soft, bowel sounds active in all 4 quadrants, non-tender, non-distended, tolerating oral diet Extremities: warm/dry, no edema  Skin: no rashes or lesions  Resolved Hospital Problem list     Assessment & Plan:  Hyponatremia, acute on chronic -Sodium on admission 108 compared to sodium 133 January 2025, it appears baseline sodium levels range in the low 130s..  Patient reports significant weakness with decreased oral intake prior to admission -Per discharge summary January 2025 patient was prescribed 2g sodium chloride 3 times daily but it does not appear this prescription was ever filled  P: Check sodium levels every 4 hours Saline bolus with given slow rise in sodium levels Close monitoring of neurostatus Seizure precautions Resume oral sodium supplementation  Rhabdomyolysis -CK 1,709 on admission with reports of poor mobility due to weakness prior to admission P: IV hydration as above Trend CK levels  Hypochloremia Hypocalcemia P: Supplement as needed Trend BMP  Le cellulitis P: Empiric Ancef Local wound care  Etoh use -Patient reports today 3/28 the last alcoholic drink was 6 months ago -Medication reconciliation not yet completed that appears patient is on naltrexone at baseline P: CIWA protocol Monitor Seizure precautions  Psoriatic arthritis -It appears patient is on Humira at baseline P: Consider resuming home medications when medication reconciliation completed  Bipolar d/o -Home medications include Celexa, Lamictal, and Zyprexa P: Resume home medications when appropriate  Essential hypertension -Home medication includes Cardizem and low-dose aspirin P: Continuous telemetry Goal of euvolemia Optimize electrolytes  Best Practice (right click and "Reselect all SmartList Selections" daily)   Diet/type: Regular consistency (see orders) DVT prophylaxis prophylactic heparin  Pressure ulcer(s): present on admission  GI prophylaxis:  N/A Lines: N/A Foley:  N/A Code Status:  full code Last date of multidisciplinary goals of care discussion [with pt 3/28]  Critical care time:  CRITICAL CARE Performed by: Ovie Eastep D. Harris   Total critical care time: 38 minutes  Critical care time was exclusive of separately billable procedures and treating other patients.  Critical care was necessary to treat or prevent imminent or life-threatening deterioration.  Critical care was time spent personally by me on the following activities: development of treatment plan with patient and/or surrogate as well as nursing, discussions with consultants, evaluation of patient's response to treatment, examination of patient, obtaining history from patient or surrogate, ordering and performing treatments and interventions, ordering and review of laboratory studies, ordering and review of radiographic studies, pulse oximetry and re-evaluation of patient's condition.  Romana Deaton D. Harris, NP-C [facility] Personal contact information can be found on Amion  If no contact or response made please call [phone] 04/16/2023, 9:25 AM

## 2023-04-16 NOTE — Plan of Care (Signed)
  Problem: Education: Goal: Knowledge of General Education information will improve Description: Including pain rating scale, medication(s)/side effects and non-pharmacologic comfort measures Outcome: Progressing   Problem: Clinical Measurements: Goal: Ability to maintain clinical measurements within normal limits will improve Outcome: Progressing Goal: Respiratory complications will improve Outcome: Progressing   Problem: Nutrition: Goal: Adequate nutrition will be maintained Outcome: Progressing   Problem: Coping: Goal: Level of anxiety will decrease Outcome: Progressing   Problem: Elimination: Goal: Will not experience complications related to urinary retention Outcome: Progressing   Problem: Clinical Measurements: Goal: Cardiovascular complication will be avoided Outcome: Not Progressing   Problem: Activity: Goal: Risk for activity intolerance will decrease Outcome: Not Progressing

## 2023-04-16 NOTE — Progress Notes (Signed)
 Brief PCCM Progress Note   Repeat sodium had dropped back to 108 but after additional fluid bolus sodium is back up to 109, discussion held with attending and decision made to decrease NS drip and resume oral sodium supplemetation and continue to monitor sodium levels closely   Billy Brickner D. Harris, NP-C [facility] Personal contact information can be found on Amion  If no contact or response made please call [phone] 04/16/2023, 2:58 PM

## 2023-04-16 NOTE — Evaluation (Signed)
 Physical Therapy Evaluation Patient Details Name: Billy Anderson MRN: [id number] DOB: Feb 05, 1965 Today's Date: 04/16/2023  History of Present Illness  Pt admitted from home with LE edema/cellulitis, Rhabdomyolysis, hyponatremia, hypocalcemia and inability to stand 2* bil foot pain with WB  Clinical Impression  Pt admitted as above and presenting with functional mobility limitations 2* generalized LE weakness, pain increased with WB and balance deficits.  Pt should progress to dc home with family assist and could benefit from follow up HHPT dependent on acute stay progress.        If plan is discharge home, recommend the following: A little help with walking and/or transfers;A little help with bathing/dressing/bathroom;Assistance with cooking/housework;Assist for transportation;Help with stairs or ramp for entrance   Can travel by private vehicle        Equipment Recommendations None recommended by PT  Recommendations for Other Services       Functional Status Assessment Patient has had a recent decline in their functional status and demonstrates the ability to make significant improvements in function in a reasonable and predictable amount of time.     Precautions / Restrictions Precautions Precautions: Fall Recall of Precautions/Restrictions: Intact Restrictions Weight Bearing Restrictions Per Provider Order: No      Mobility  Bed Mobility Overal bed mobility: Modified Independent             General bed mobility comments: unassisted supine<>sit    Transfers Overall transfer level: Needs assistance Equipment used: Rolling walker (2 wheels) Transfers: Sit to/from Stand Sit to Stand: Min assist, +2 safety/equipment           General transfer comment: Steady assist with cues for use of UEs to self assist    Ambulation/Gait Ambulation/Gait assistance: Min assist, +2 safety/equipment Gait Distance (Feet): 5 Feet Assistive device: Rolling walker (2  wheels) Gait Pattern/deviations: Step-to pattern, Decreased step length - right, Decreased step length - left, Shuffle, Trunk flexed       General Gait Details: Side shuffled up side of bed with RW only  Stairs            Wheelchair Mobility     Tilt Bed    Modified Rankin (Stroke Patients Only)       Balance Overall balance assessment: Needs assistance Sitting-balance support: No upper extremity supported, Feet supported Sitting balance-Leahy Scale: Good     Standing balance support: Bilateral upper extremity supported Standing balance-Leahy Scale: Poor                               Pertinent Vitals/Pain Pain Assessment Pain Assessment: 0-10 Pain Score: 4  Pain Location: bilat feet with WB Pain Descriptors / Indicators: Aching, Grimacing, Sore Pain Intervention(s): Limited activity within patient's tolerance, Monitored during session    Home Living Family/patient expects to be discharged to:: Private residence Living Arrangements: Spouse/significant other Available Help at Discharge: Family;Available PRN/intermittently Type of Home: House Home Access: Stairs to enter Entrance Stairs-Rails: Doctor, general practice of Steps: 5   Home Layout: One level Home Equipment: Agricultural consultant (2 wheels);Rollator (4 wheels);Cane - single point;Shower seat      Prior Function Prior Level of Function : Independent/Modified Independent             Mobility Comments: using rollator ADLs Comments: Assist of family as needed     Extremity/Trunk Assessment   Upper Extremity Assessment Upper Extremity Assessment: Overall WFL for tasks assessed    Lower Extremity Assessment  Lower Extremity Assessment: RLE deficits/detail;LLE deficits/detail RLE Deficits / Details: Reddened and edematous RLE: Unable to fully assess due to pain LLE Deficits / Details: Reddened and edematous LLE: Unable to fully assess due to pain    Cervical / Trunk  Assessment Cervical / Trunk Assessment: Normal  Communication   Communication Communication: No apparent difficulties    Cognition Arousal: Alert Behavior During Therapy: WFL for tasks assessed/performed, Impulsive   PT - Cognitive impairments: No apparent impairments                         Following commands: Intact       Cueing Cueing Techniques: Verbal cues     General Comments      Exercises     Assessment/Plan    PT Assessment Patient needs continued PT services  PT Problem List Decreased strength;Decreased activity tolerance;Decreased balance;Decreased mobility;Decreased knowledge of use of DME;Pain       PT Treatment Interventions DME instruction;Gait training;Stair training;Functional mobility training;Therapeutic activities;Therapeutic exercise;Balance training;Patient/family education    PT Goals (Current goals can be found in the Care Plan section)  Acute Rehab PT Goals Patient Stated Goal: RegaiN IND PT Goal Formulation: With patient Time For Goal Achievement: 04/29/23 Potential to Achieve Goals: Good    Frequency Min 3X/week     Co-evaluation               AM-PAC PT "6 Clicks" Mobility  Outcome Measure Help needed turning from your back to your side while in a flat bed without using bedrails?: None Help needed moving from lying on your back to sitting on the side of a flat bed without using bedrails?: None Help needed moving to and from a bed to a chair (including a wheelchair)?: A Little Help needed standing up from a chair using your arms (e.g., wheelchair or bedside chair)?: A Little Help needed to walk in hospital room?: Total Help needed climbing 3-5 steps with a railing? : Total 6 Click Score: 16    End of Session Equipment Utilized During Treatment: Gait belt Activity Tolerance: Patient limited by pain Patient left: in bed;with call bell/phone within reach;with bed alarm set Nurse Communication: Mobility status PT  Visit Diagnosis: Difficulty in walking, not elsewhere classified (R26.2)    Time: 8295-6213 PT Time Calculation (min) (ACUTE ONLY): 15 min   Charges:   PT Evaluation $PT Eval Low Complexity: 1 Low   PT General Charges $$ ACUTE PT VISIT: 1 Visit         Mauro Kaufmann PT Acute Rehabilitation Services Pager [phone] Office [phone]   Billy Anderson 04/16/2023, 1:17 PM

## 2023-04-16 NOTE — H&P (Signed)
 NAME:  Billy Anderson, MRN:  [id number], DOB:  06/18/1965, LOS: 0 ADMISSION DATE:  04/15/2023, CONSULTATION DATE:  04/16/23 REFERRING MD:  EDP, CHIEF COMPLAINT:  hyponatremia   History of Present Illness:  58 yo male presented 2/2 b/l le swelling and pain that began reportedly 2 days ago. He states that they have been weeping, and are warm to touch. Pt states he has not been eating or drinking well for past few days 2/2 dental issues. He endorses feeling weak and has been unable to move around much or clean his wounds on his le. Denies fever/chills/n/v/d. States he has been taking his medications as prescribed with exception of humira and salt tabs. He states other than weakness and le issues he denies any other complaints.   Pt also denies and etoh use since last weekend. He states he has not gone thru withdrawals for years.   Pertinent  Medical History  Etoh abuse Bipolar d/o Psoriatic arthritis Htn Chronic hyponatremia   Significant Hospital Events: Including procedures, antibiotic start and stop dates in addition to other pertinent events   Admitted to ICU 3/28 for continued sodium checks  Interim History / Subjective:    Objective   Blood pressure (!) 154/128, pulse 71, temperature 98 F (36.7 C), temperature source Oral, resp. rate 20, SpO2 98%.        Intake/Output Summary (Last 24 hours) at 04/16/2023 0255 Last data filed at 04/15/2023 2354 Gross per 24 hour  Intake --  Output 300 ml  Net -300 ml   There were no vitals filed for this visit.  Examination: General: resting comfortably supine in bed, nad, appears chronically ill HENT: ncat, eomi, mm dry but pink, perrla Lungs: ctab Cardiovascular: tachy, no m/g/r Abdomen: soft nt/nd bs+ Extremities: no c/c + psoriatic plaques noted b/l le, erythema L>>R open wounds on toes of L  Neuro: no focal deficits, mildly tremulous. Oriented x3, flat affect GU: deferred.   Resolved Hospital Problem list     Assessment &  Plan:  Hyponatremia, acute on chronic Hypochloremia hypocalcemia Rhabdomyolysis Le cellulitis Etoh use Psoriatic arthritis Bipolar d/o Htn Sz d/o -started NS for pt after bolus -will recheck sodium level now and based on result titrate -cont abx for le cellulitis, f/u cx -monitor for signs of withdraws -recheck ck in am -replace calcium -home medications can be affecting this as well, he does report he has also not been taking his sodium tablets.  Best Practice (right click and "Reselect all SmartList Selections" daily)   Diet/type: Regular consistency (see orders) DVT prophylaxis prophylactic heparin  Pressure ulcer(s): present on admission  GI prophylaxis: N/A Lines: N/A Foley:  N/A Code Status:  full code Last date of multidisciplinary goals of care discussion [with pt 3/28]  Labs   CBC: Recent Labs  Lab 04/15/23 2105  WBC 10.4  NEUTROABS 8.8*  HGB 13.0  HCT 35.2*  MCV 91.4  PLT 303    Basic Metabolic Panel: Recent Labs  Lab 04/15/23 2105  NA 108*  K 4.6  CL 72*  CO2 23  GLUCOSE 80  BUN <5*  CREATININE 0.44*  CALCIUM 8.5*   GFR: CrCl cannot be calculated (Unknown ideal weight.). Recent Labs  Lab 04/15/23 2105 04/15/23 2116 04/15/23 2307  WBC 10.4  --   --   LATICACIDVEN  --  1.0 0.4*    Liver Function Tests: Recent Labs  Lab 04/15/23 2105  AST 96*  ALT 41  ALKPHOS 89  BILITOT 1.3*  PROT  6.6  ALBUMIN 3.5   No results for input(s): "LIPASE", "AMYLASE" in the last 168 hours. No results for input(s): "AMMONIA" in the last 168 hours.  ABG    Component Value Date/Time   TCO2 24 01/19/2023 0253     Coagulation Profile: Recent Labs  Lab 04/15/23 2105  INR 1.0    Cardiac Enzymes: Recent Labs  Lab 04/15/23 2105  CKTOTAL 1,709*    HbA1C: No results found for: "HGBA1C"  CBG: Recent Labs  Lab 04/15/23 2259  GLUCAP 77    Review of Systems:   As per HPI  Past Medical History:  He,  has a past medical history of  Arthritis, Bipolar disorder (HCC), Dysrhythmia, GERD (gastroesophageal reflux disease), Hypertension, Pneumonia, and Seizures (HCC).   Surgical History:   Past Surgical History:  Procedure Laterality Date   ANTERIOR CERVICAL DECOMP/DISCECTOMY FUSION N/A 03/20/2022   Procedure: Cervical Three-Four, Cervical Four-Five Anterior Cervical Discectomy Fusion;  Surgeon: Coletta Memos, MD;  Location: [facility];  Service: Neurosurgery;  Laterality: N/A;  RM 20 to follow 3C   HAND SURGERY Left    carpal tunnel release     Social History:   reports that he has been smoking cigarettes. He does not have any smokeless tobacco history on file. He reports that he does not currently use alcohol after a past usage of about 48.0 standard drinks of alcohol per week. He reports that he does not use drugs.   Family History:  His family history includes Bipolar disorder in his mother.   Allergies Allergies  Allergen Reactions   Ativan [Lorazepam] Other (See Comments)    Confusion  Hallucinations    Roxicodone [Oxycodone] Itching   Ultram [Tramadol] Other (See Comments)    Seizures    Vistaril [Hydroxyzine] Itching and Other (See Comments)    Sedation Not effective in reducing anxiety     Home Medications  Prior to Admission medications   Medication Sig Start Date End Date Taking? Authorizing Provider  halobetasol (ULTRAVATE) 0.05 % cream Apply topically. 04/08/23  Yes [provider]  sodium chloride 1 g tablet Take 2 tablets (2 g total) by mouth 3 (three) times daily with meals. 01/26/23  Yes Paseda, Baird Kay, FNP  aspirin EC 81 MG tablet Take 1 tablet (81 mg total) by mouth daily. 10/31/21   Love, Evlyn Kanner, PA-C  Calcium Carb-Cholecalciferol (CALCIUM 600+D3 PO) Take 1 tablet by mouth 2 (two) times daily.    [provider]  citalopram (CELEXA) 10 MG tablet Take 1 tablet (10 mg total) by mouth at bedtime. 11/11/22   Arfeen, Phillips Grout, MD  diltiazem (CARDIZEM CD) 180 MG 24 hr capsule TAKE 2  CAPSULES BY MOUTH DAILY 02/08/23   Paseda, Baird Kay, FNP  docusate sodium (COLACE) 100 MG capsule Take 1 capsule (100 mg total) by mouth 2 (two) times daily. 01/21/23   Rolly Salter, MD  Fexofenadine HCl (ALLEGRA PO) Take 180 mg by mouth daily.    [provider]  folic acid (FOLVITE) 1 MG tablet Take 1 tablet (1 mg total) by mouth daily. 01/26/23   Paseda, Baird Kay, FNP  gabapentin (NEURONTIN) 100 MG capsule Take 1 capsule (100 mg total) by mouth 2 (two) times daily for 10 days. 01/26/23 02/05/23  Paseda, Baird Kay, FNP  HUMIRA, 2 PEN, 40 MG/0.8ML AJKT pen Inject 0.8 mLs into the skin every 14 (fourteen) days. Patient not taking: Reported on 01/26/2023 01/01/23   [provider]  lamoTRIgine (LAMICTAL) 100 MG tablet  TAKE TWO TABLETS BY MOUTH AT BEDTIME 02/01/23   Windell Norfolk, MD  Multiple Vitamin (MULTIVITAMIN) tablet Take 1 tablet by mouth daily. Patient taking differently: Take 1 tablet by mouth in the morning and at bedtime. 10/22/21   Almon Hercules, MD  naltrexone (DEPADE) 50 MG tablet Take 1 tablet (50 mg total) by mouth daily. Patient taking differently: Take 50 mg by mouth at bedtime. 11/11/22   Arfeen, Phillips Grout, MD  OLANZapine (ZYPREXA) 20 MG tablet Take 1 tablet (20 mg total) by mouth at bedtime. 11/11/22   Arfeen, Phillips Grout, MD  oxybutynin (DITROPAN-XL) 10 MG 24 hr tablet TAKE 1 TABLET BY MOUTH AT BEDTIME 02/01/23   Paseda, Baird Kay, FNP  pantoprazole (PROTONIX) 40 MG tablet Take 1 tablet (40 mg total) by mouth daily. 03/17/23   Donell Beers, FNP  pyridOXINE (VITAMIN B6) 100 MG tablet Take 1 tablet (100 mg total) by mouth daily. Patient taking differently: Take 100 mg by mouth in the morning and at bedtime. 10/31/21   Love, Evlyn Kanner, PA-C  thiamine (VITAMIN B-1) 100 MG tablet Take 1 tablet (100 mg total) by mouth daily. 01/22/23   Rolly Salter, MD     Critical care time: 

## 2023-04-17 ENCOUNTER — Inpatient Hospital Stay (HOSPITAL_COMMUNITY)

## 2023-04-17 DIAGNOSIS — I428 Other cardiomyopathies: Secondary | ICD-10-CM

## 2023-04-17 DIAGNOSIS — E871 Hypo-osmolality and hyponatremia: Secondary | ICD-10-CM | POA: Diagnosis not present

## 2023-04-17 LAB — PROTIME-INR
INR: 1 (ref 0.8–1.2)
Prothrombin Time: 13.5 s (ref 11.4–15.2)

## 2023-04-17 LAB — SODIUM
Sodium: 116 mmol/L — CL (ref 135–145)
Sodium: 116 mmol/L — CL (ref 135–145)
Sodium: 118 mmol/L — CL (ref 135–145)
Sodium: 118 mmol/L — CL (ref 135–145)

## 2023-04-17 LAB — ECHOCARDIOGRAM COMPLETE
AR max vel: 2.32 cm2
AV Area VTI: 2.73 cm2
AV Area mean vel: 2.49 cm2
AV Mean grad: 5 mmHg
AV Peak grad: 10.4 mmHg
Ao pk vel: 1.61 m/s
Area-P 1/2: 3.54 cm2
Height: 72 in
MV VTI: 2.65 cm2
S' Lateral: 3.7 cm
Single Plane A4C EF: 68.9 %
Weight: 3089.97 [oz_av]

## 2023-04-17 LAB — COMPREHENSIVE METABOLIC PANEL WITH GFR
ALT: 31 U/L (ref 0–44)
AST: 52 U/L — ABNORMAL HIGH (ref 15–41)
Albumin: 2.9 g/dL — ABNORMAL LOW (ref 3.5–5.0)
Alkaline Phosphatase: 74 U/L (ref 38–126)
Anion gap: 11 (ref 5–15)
BUN: <5 mg/dL — ABNORMAL LOW (ref 6–20)
CO2: 23 mmol/L (ref 22–32)
Calcium: 8.1 mg/dL — ABNORMAL LOW (ref 8.9–10.3)
Chloride: 84 mmol/L — ABNORMAL LOW (ref 98–111)
Creatinine, Ser: 0.35 mg/dL — ABNORMAL LOW (ref 0.61–1.24)
GFR, Estimated: >60 mL/min (ref >60)
Glucose, Bld: 99 mg/dL (ref 70–99)
Potassium: 3.5 mmol/L (ref 3.5–5.1)
Sodium: 118 mmol/L — CL (ref 135–145)
Total Bilirubin: 0.4 mg/dL (ref 0.0–1.2)
Total Protein: 6 g/dL — ABNORMAL LOW (ref 6.5–8.1)

## 2023-04-17 LAB — PHOSPHORUS: Phosphorus: 2.9 mg/dL (ref 2.5–4.6)

## 2023-04-17 LAB — CK: Total CK: 330 U/L (ref 49–397)

## 2023-04-17 LAB — AMMONIA: Ammonia: 35 umol/L (ref 9–35)

## 2023-04-17 LAB — MAGNESIUM: Magnesium: 1.7 mg/dL (ref 1.7–2.4)

## 2023-04-17 MED ORDER — POTASSIUM CHLORIDE CRYS ER 20 MEQ PO TBCR
40.0000 meq | EXTENDED_RELEASE_TABLET | Freq: Once | ORAL | Status: AC
  Administered 2023-04-17: 40 meq via ORAL
  Filled 2023-04-17: qty 2

## 2023-04-17 MED ORDER — AMLODIPINE BESYLATE 5 MG PO TABS
5.0000 mg | ORAL_TABLET | Freq: Every day | ORAL | Status: DC
  Administered 2023-04-17 – 2023-04-18 (×2): 5 mg via ORAL
  Filled 2023-04-17 (×2): qty 1

## 2023-04-17 MED ORDER — MAGNESIUM SULFATE 2 GM/50ML IV SOLN
2.0000 g | Freq: Once | INTRAVENOUS | Status: AC
  Administered 2023-04-17: 2 g via INTRAVENOUS
  Filled 2023-04-17: qty 50

## 2023-04-17 MED ORDER — HYDRALAZINE HCL 25 MG PO TABS
25.0000 mg | ORAL_TABLET | Freq: Four times a day (QID) | ORAL | Status: DC | PRN
  Administered 2023-04-17 – 2023-04-19 (×6): 25 mg via ORAL
  Filled 2023-04-17 (×6): qty 1

## 2023-04-17 MED ORDER — SODIUM CHLORIDE 1 G PO TABS
1.0000 g | ORAL_TABLET | Freq: Once | ORAL | Status: AC
  Administered 2023-04-17: 1 g via ORAL
  Filled 2023-04-17: qty 1

## 2023-04-17 NOTE — Plan of Care (Signed)
  Problem: Education: Goal: Knowledge of General Education information will improve Description: Including pain rating scale, medication(s)/side effects and non-pharmacologic comfort measures Outcome: Progressing   Problem: Clinical Measurements: Goal: Respiratory complications will improve Outcome: Progressing   Problem: Nutrition: Goal: Adequate nutrition will be maintained Outcome: Progressing   Problem: Coping: Goal: Level of anxiety will decrease Outcome: Progressing   Problem: Elimination: Goal: Will not experience complications related to urinary retention Outcome: Progressing   Problem: Clinical Measurements: Goal: Cardiovascular complication will be avoided Outcome: Not Progressing   Problem: Activity: Goal: Risk for activity intolerance will decrease Outcome: Not Progressing

## 2023-04-17 NOTE — Progress Notes (Signed)
 [facility]  PROGRESS NOTE  Billy Anderson BMW:[id number] DOB: 16-Jul-1965 DOA: 04/15/2023 PCP: Donell Beers, FNP   Brief HPI:   58 yo male Etoh abuse, Bipolar d/o, Psoriatic arthritis, Htn, Chronic hyponatremia presented 2/2 b/l le swelling and pain that began reportedly 2 days ago. He states that they have been weeping, and are warm to touch. Pt states he has not been eating or drinking well for past few days 2/2 dental issues. He endorses feeling weak and has been unable to move around much or clean his wounds on his le. Denies fever/chills/n/v/d. States he has been taking his medications as prescribed with exception of humira and salt tabs. He states other than weakness and le issues he denies any other complaints.     Assessment/Plan:   Hyponatremia, acute on chronic -Sodium admission was 108, previous sodium was 133 from January 2025. -That appears to baseline as sodium is in 130s. -Patient had very poor p.o. intake prior to admission. -Per discharge summary he was prescribed 2 g sodium 3 times daily in January 2025, however patient did not take it. -Patient was started on sodium chloride tablets along with normal saline -Sodium improved to 110 to 116 within 3 hours so NS gtt. and oral sodium chloride tablets were discontinued. -Sodium has remained 118 today -Will continue to check sodium every 6 hours -Start 1.2 L fluid restriction -Urine osmolality is low at 225,   Rhabdomyolysis -CK 1,709 on admission with reports of poor mobility due to weakness prior to admission Repeat CK level today is 330    Le cellulitis Empiric Ancef Local wound care   Etoh use -Patient reports today 3/28 the last alcoholic drink was 6 months ago -Medication reconciliation not yet completed that appears patient is on naltrexone at baseline CIWA protocol Monitor Seizure precautions   Psoriatic arthritis -It appears patient is on Humira at baseline Consider resuming home  medications when medication reconciliation completed   Bipolar d/o -Home medications include Celexa, Lamictal, and Zyprexa Resume home medications when appropriate   Essential hypertension -Home medication includes Cardizem and low-dose aspirin Continuous telemetry Goal of euvolemia Optimize electrolytes      Medications     Chlorhexidine Gluconate Cloth  6 each Topical Daily   enoxaparin (LOVENOX) injection  40 mg Subcutaneous Q24H   folic acid  1 mg Oral Daily   multivitamin with minerals  1 tablet Oral Daily   pantoprazole  40 mg Oral Daily   potassium chloride  40 mEq Oral Once   thiamine  100 mg Oral Daily   Or   thiamine  100 mg Intravenous Daily     Data Reviewed:   CBG:  Recent Labs  Lab 04/15/23 2259  GLUCAP 77    SpO2: 94 %    Vitals:   04/17/23 0300 04/17/23 0344 04/17/23 0500 04/17/23 0725  BP:  (!) 145/48    Pulse: 77 70  65  Resp: 20 19  15   Temp:      TempSrc:      SpO2: 96% 95%  94%  Weight:   87.6 kg   Height:          Data Reviewed:  Basic Metabolic Panel: Recent Labs  Lab 04/15/23 2105 04/16/23 0235 04/16/23 0524 04/16/23 0842 04/16/23 1202 04/16/23 1542 04/16/23 1939 04/17/23 0005 04/17/23 0308  NA 108* 109* 109*   < > 109* 110* 116* 116* 118*  K 4.6 3.5 4.0  --   --   --   --   --  3.5  CL 72* 76* 74*  --   --   --   --   --  84*  CO2 23 23 22   --   --   --   --   --  23  GLUCOSE 80 128* 93  --   --   --   --   --  99  BUN <5* <5* <5*  --   --   --   --   --  <5*  CREATININE 0.44* 0.41* 0.48*  --   --   --   --   --  0.35*  CALCIUM 8.5* 7.6* 8.2*  --   --   --   --   --  8.1*  MG  --   --  1.8  --   --   --   --   --  1.7  PHOS  --   --   --   --   --   --   --   --  2.9   < > = values in this interval not displayed.    CBC: Recent Labs  Lab 04/15/23 2105 04/16/23 0524  WBC 10.4 10.8*  NEUTROABS 8.8*  --   HGB 13.0 13.3  HCT 35.2* 36.1*  MCV 91.4 92.1  PLT 303 288    LFT Recent Labs  Lab  04/15/23 2105 04/17/23 0308  AST 96* 52*  ALT 41 31  ALKPHOS 89 74  BILITOT 1.3* 0.4  PROT 6.6 6.0*  ALBUMIN 3.5 2.9*     Antibiotics: Anti-infectives (From admission, onward)    Start     Dose/Rate Route Frequency Ordered Stop   04/16/23 0600  ceFAZolin (ANCEF) IVPB 2g/100 mL premix        2 g 200 mL/hr over 30 Minutes Intravenous Every 8 hours 04/16/23 0528 04/23/23 0559   04/15/23 2230  cefTRIAXone (ROCEPHIN) 2 g in sodium chloride 0.9 % 100 mL IVPB        2 g 200 mL/hr over 30 Minutes Intravenous  Once 04/15/23 2226 04/15/23 2346        DVT prophylaxis: Heparin  Code Status: Full code  Family Communication:    CONSULTS    Subjective   Denies any complaints   Objective    Physical Examination:   General-appears in no acute distress Heart-S1-S2, regular, no murmur auscultated Lungs-clear to auscultation bilaterally, no wheezing or crackles auscultated Abdomen-soft, nontender, no organomegaly Extremities-no edema in the lower extremities Neuro-alert, oriented x3, no focal deficit noted   Status is: Inpatient:             Meredeth Ide   [facility] If 7PM-7AM, please contact night-coverage at [email or web address], Office  [phone]   04/17/2023, 7:58 AM  LOS: 1 day

## 2023-04-17 NOTE — Progress Notes (Signed)
 [facility] ADULT ICU REPLACEMENT PROTOCOL   The patient does apply for the [facility] Adult ICU Electrolyte Replacment Protocol based on the criteria listed below:   1.Exclusion criteria: TCTS, ECMO, Dialysis, and Myasthenia Gravis patients 2. Is GFR >/= 30 ml/min? Yes.    Patient's GFR today is >60 3. Is SCr </= 2? Yes.   Patient's SCr is 0.35 mg/dL 4. Did SCr increase >/= 0.5 in 24 hours? No. 5.Pt's weight >40kg  Yes.   6. Abnormal electrolyte(s): Potassium, Magnesium  7. Electrolytes replaced per protocol 8.  Call MD STAT for K+ </= 2.5, Phos </= 1, or Mag </= 1 Physician:  Dr. Namon Cirri A Jirah Rider 04/17/2023 5:22 AM

## 2023-04-18 DIAGNOSIS — E871 Hypo-osmolality and hyponatremia: Secondary | ICD-10-CM | POA: Diagnosis not present

## 2023-04-18 LAB — SODIUM
Sodium: 120 mmol/L — ABNORMAL LOW (ref 135–145)
Sodium: 121 mmol/L — ABNORMAL LOW (ref 135–145)
Sodium: 121 mmol/L — ABNORMAL LOW (ref 135–145)

## 2023-04-18 MED ORDER — SODIUM CHLORIDE 1 G PO TABS
2.0000 g | ORAL_TABLET | Freq: Once | ORAL | Status: AC
  Administered 2023-04-18: 2 g via ORAL
  Filled 2023-04-18: qty 2

## 2023-04-18 MED ORDER — AMLODIPINE BESYLATE 5 MG PO TABS
5.0000 mg | ORAL_TABLET | Freq: Once | ORAL | Status: AC
  Administered 2023-04-18: 5 mg via ORAL
  Filled 2023-04-18: qty 1

## 2023-04-18 MED ORDER — MELATONIN 5 MG PO TABS
5.0000 mg | ORAL_TABLET | Freq: Every evening | ORAL | Status: AC | PRN
  Administered 2023-04-18: 5 mg via ORAL
  Filled 2023-04-18: qty 1

## 2023-04-18 MED ORDER — AMLODIPINE BESYLATE 10 MG PO TABS
10.0000 mg | ORAL_TABLET | Freq: Every day | ORAL | Status: DC
  Administered 2023-04-19: 10 mg via ORAL
  Filled 2023-04-18: qty 1

## 2023-04-18 NOTE — Consult Note (Signed)
 WOC Nurse Consult Note: Reason for Consult: BLE cellulitis and L leg weeping  Wound type: full thickness r/t edema, cellulitis  Pressure Injury POA: NA  Measurement: see nursing flowsheet  Wound bed: ruptured blister dorsal L foot and anterior shin per MD note  Drainage (amount, consistency, odor)  serous  Periwound: edema  Dressing procedure/placement/frequency: Cleanse L lower leg/dorsal foot (intact skin and open) with Vashe wound cleanser [employer] [place] [phone]), apply Xeroform gauze [place] [phone]) to open wound beds, cover with ABD pad and wrap with Kerlix roll gauze beginning right above toes and ending right below knee.  May apply Ace bandage wrapped in same fashion as Kerlix for light compression.   Per MD note right leg is dry, bedside RN denies any wounds to that leg as well.   POC discussed with bedside nurse. WOC team will not follow. Re-consult if further needs arise.   Thank you,    Priscella Mann MSN, RN-BC, [employer] [phone]

## 2023-04-18 NOTE — Plan of Care (Signed)
  Problem: Nutrition: Goal: Adequate nutrition will be maintained Outcome: Progressing   Problem: Coping: Goal: Level of anxiety will decrease Outcome: Progressing   Problem: Clinical Measurements: Goal: Ability to avoid or minimize complications of infection will improve Outcome: Progressing

## 2023-04-18 NOTE — Progress Notes (Signed)
 [facility]  PROGRESS NOTE  Billy Anderson HYQ:[id number] DOB: 1965-03-21 DOA: 04/15/2023 PCP: Donell Beers, FNP   Brief HPI:   58 yo male Etoh abuse, Bipolar d/o, Psoriatic arthritis, Htn, Chronic hyponatremia presented 2/2 b/l le swelling and pain that began reportedly 2 days ago. He states that they have been weeping, and are warm to touch. Pt states he has not been eating or drinking well for past few days 2/2 dental issues. He endorses feeling weak and has been unable to move around much or clean his wounds on his le. Denies fever/chills/n/v/d. States he has been taking his medications as prescribed with exception of humira and salt tabs. He states other than weakness and le issues he denies any other complaints.     Assessment/Plan:   Hyponatremia, acute on chronic -Sodium admission was 108, previous sodium was 133 from January 2025. -That appears to baseline as sodium is in 130s. -Patient had very poor p.o. intake prior to admission. -Per discharge summary he was prescribed 2 g sodium 3 times daily in January 2025, however patient did not take it. -Patient was started on sodium chloride tablets along with normal saline -Sodium improved to 110 to 116 within 3 hours so NS gtt. and oral sodium chloride tablets were discontinued. -Will continue to check sodium every 6 hours -Started 1.2 L fluid restriction -Serum osmolality is low at 225; urine osmolality 317, urine sodium 37 -Sodium is slowly improving, it has improved to 121 today.   -We have been giving him salt tablets intermittently.   Rhabdomyolysis -CK 1,709 on admission with reports of poor mobility due to weakness prior to admission Repeat CK level  is 330    Le cellulitis Empiric Ancef Local wound care   Etoh use -Patient reports today 3/28 the last alcoholic drink was 6 months ago -Medication reconciliation not yet completed that appears patient is on naltrexone at baseline CIWA  protocol Monitor Seizure precautions   Psoriatic arthritis -It appears patient is on Humira at baseline Consider resuming home medications when medication reconciliation completed   Bipolar d/o -Home medications include Celexa, Lamictal, and Zyprexa Resume home medications when appropriate   Essential hypertension -Home medication includes Cardizem and low-dose aspirin Continuous telemetry Goal of euvolemia Optimize electrolytes      Medications     amLODipine  5 mg Oral Daily   Chlorhexidine Gluconate Cloth  6 each Topical Daily   enoxaparin (LOVENOX) injection  40 mg Subcutaneous Q24H   folic acid  1 mg Oral Daily   multivitamin with minerals  1 tablet Oral Daily   pantoprazole  40 mg Oral Daily   thiamine  100 mg Oral Daily   Or   thiamine  100 mg Intravenous Daily     Data Reviewed:   CBG:  Recent Labs  Lab 04/15/23 2259  GLUCAP 77    SpO2: 98 %    Vitals:   04/18/23 0800 04/18/23 0900 04/18/23 0901 04/18/23 1000  BP: (!) 169/74 (!) 143/63  (!) 166/80  Pulse: 61 74 70 70  Resp: 20 (!) 21 20 18   Temp:      TempSrc:      SpO2: 94% (!) 89% 95% 98%  Weight:      Height:          Data Reviewed:  Basic Metabolic Panel: Recent Labs  Lab 04/15/23 2105 04/16/23 0235 04/16/23 0524 04/16/23 0842 04/17/23 0308 04/17/23 1136 04/17/23 1642 04/17/23 2125 04/18/23 0304 04/18/23 1034  NA 108*  109* 109*   < > 118* 118* 116* 118* 120* 121*  K 4.6 3.5 4.0  --  3.5  --   --   --   --   --   CL 72* 76* 74*  --  84*  --   --   --   --   --   CO2 23 23 22   --  23  --   --   --   --   --   GLUCOSE 80 128* 93  --  99  --   --   --   --   --   BUN <5* <5* <5*  --  <5*  --   --   --   --   --   CREATININE 0.44* 0.41* 0.48*  --  0.35*  --   --   --   --   --   CALCIUM 8.5* 7.6* 8.2*  --  8.1*  --   --   --   --   --   MG  --   --  1.8  --  1.7  --   --   --   --   --   PHOS  --   --   --   --  2.9  --   --   --   --   --    < > = values in this interval  not displayed.    CBC: Recent Labs  Lab 04/15/23 2105 04/16/23 0524  WBC 10.4 10.8*  NEUTROABS 8.8*  --   HGB 13.0 13.3  HCT 35.2* 36.1*  MCV 91.4 92.1  PLT 303 288    LFT Recent Labs  Lab 04/15/23 2105 04/17/23 0308  AST 96* 52*  ALT 41 31  ALKPHOS 89 74  BILITOT 1.3* 0.4  PROT 6.6 6.0*  ALBUMIN 3.5 2.9*     Antibiotics: Anti-infectives (From admission, onward)    Start     Dose/Rate Route Frequency Ordered Stop   04/16/23 0600  ceFAZolin (ANCEF) IVPB 2g/100 mL premix        2 g 200 mL/hr over 30 Minutes Intravenous Every 8 hours 04/16/23 0528 04/23/23 0559   04/15/23 2230  cefTRIAXone (ROCEPHIN) 2 g in sodium chloride 0.9 % 100 mL IVPB        2 g 200 mL/hr over 30 Minutes Intravenous  Once 04/15/23 2226 04/15/23 2346        DVT prophylaxis: Heparin  Code Status: Full code  Family Communication:    CONSULTS    Subjective   Denies any complaints.   Objective    Physical Examination:  General-appears in no acute distress Heart-S1-S2, regular, no murmur auscultated Lungs-clear to auscultation bilaterally, no wheezing or crackles auscultated Abdomen-soft, nontender, no organomegaly Extremities-no edema in the lower extremities, right lower extremity in dressing.  No lesions or open wounds noted. Neuro-alert, oriented x3, no focal deficit noted   Status is: Inpatient:             Meredeth Ide   [facility] If 7PM-7AM, please contact night-coverage at [email or web address], Office  [phone]   04/18/2023, 12:04 PM  LOS: 2 days

## 2023-04-18 NOTE — TOC Initial Note (Signed)
 Transition of Care [facility]) - Initial/Assessment Note    Patient Details  Name: Billy Anderson MRN: [id number] Date of Birth: [birth year]  Transition of Care [facility] CM/SW Contact:    Billy Prows, RN Phone Number: 04/18/2023, 3:04 PM  Clinical Narrative:                 Billy Anderson w/ pt in room; pt says he lives at home w/ his S.O. Billy Anderson [phone]); he plans to return at d/c; he says his S.O. will provide transportation; he verified insurance/PCP; pt denies SDOH risks; pt says he has a walker, and shower chair; pt says he had HHRN from [facility] for dressing changes; pt says does not have home oxygen; PT recc HHPT dependent on acute stay progress; TOC is following; will set up [facility] services when final determination made.  Expected Discharge Plan: Home/Self Care Barriers to Discharge: Continued Medical Work up   Patient Goals and CMS Choice Patient states their goals for this hospitalization and ongoing recovery are:: home CMS Medicare.gov Compare Post Acute Care list provided to:: Patient        Expected Discharge Plan and Services   Discharge Planning Services: CM Consult   Living arrangements for the past 2 months: Single Family Home                                      Prior Living Arrangements/Services Living arrangements for the past 2 months: Single Family Home Lives with:: Significant Other Patient language and need for interpreter reviewed:: Yes Do you feel safe going back to the place where you live?: Yes      Need for Family Participation in Patient Care: Yes (Comment) Care giver support system in place?: Yes (comment) Current home services: DME (walker, shower chair) Criminal Activity/Legal Involvement Pertinent to Current Situation/Hospitalization: No - Comment as needed  Activities of Daily Living   ADL Screening (condition at time of admission) Independently performs ADLs?: No Does the patient have a NEW difficulty with  bathing/dressing/toileting/self-feeding that is expected to last >3 days?: No Does the patient have a NEW difficulty with getting in/out of bed, walking, or climbing stairs that is expected to last >3 days?: Yes (Initiates electronic notice to provider for possible PT consult) Does the patient have a NEW difficulty with communication that is expected to last >3 days?: No Is the patient deaf or have difficulty hearing?: Yes Does the patient have difficulty seeing, even when wearing glasses/contacts?: No Does the patient have difficulty concentrating, remembering, or making decisions?: No  Permission Sought/Granted Permission sought to share information with : Case Manager Permission granted to share information with : Yes, Verbal Permission Granted  Share Information with NAME: Case Manager     Permission granted to share info w Relationship: Billy Anderson (S.O.) [phone]     Emotional Assessment Appearance:: Appears stated age Attitude/Demeanor/Rapport: Gracious Affect (typically observed): Accepting Orientation: : Oriented to Self, Oriented to Place, Oriented to  Time, Oriented to Situation Alcohol / Substance Use: Not Applicable Psych Involvement: No (comment)  Admission diagnosis:  Hyponatremia [E87.1] Patient Active Problem List   Diagnosis Date Noted   Need for influenza vaccination 01/26/2023   Enlarged and hypertrophic nails 01/26/2023   Bilateral cellulitis of lower leg 01/19/2023   Alcohol abuse 01/19/2023   Overactive bladder 05/12/2022   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 [facility]) 03/20/2022  Hospital discharge follow-up 11/03/2021   Seizure (HCC) 11/03/2021   Shoulder weakness--likely left supraspinatous injury/tear 10/31/2021   Pressure injury of skin 10/24/2021   Closed compression fracture of L5 lumbar vertebra, sequela 10/17/2021   Lumbar spinal stenosis 10/17/2021   Hyperbilirubinemia 10/17/2021   Transaminitis  10/17/2021   Ambulatory dysfunction 10/16/2021   Generalized weakness 10/16/2021   Compliance with medication regimen 10/02/2020   Driving safety issue 28/41/3244   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   Paroxysmal atrial fibrillation (HCC) 05/20/2020   Chronic midline low back pain 03/26/2020   Encephalopathy 03/23/2020   Tobacco use disorder 02/23/2020   Thrombocytopenia (HCC) 10/19/2019   Hyponatremia 10/19/2019   Macrocytic anemia 10/19/2019   Recurrent falls 02/20/2019   Positive colorectal cancer screening using DNA-based stool test 06/24/2016   Psoriatic arthritis (HCC) 06/07/2015   Essential hypertension 06/08/2012   Anxiety state 06/08/2012   Alcohol use disorder, moderate, in early remission (HCC) 05/11/2012   Bipolar I disorder, most recent episode (or current) manic (HCC) 05/11/2012   PCP:  Billy Beers, FNP Pharmacy:   [id number] - [place], [place] - [address] [place] [address] Phone: [phone] Fax: [phone]     Social Drivers of Health (SDOH) Social History: SDOH Screenings   Food Insecurity: No Food Insecurity (04/18/2023)  Housing: Low Risk  (04/18/2023)  Recent Concern: Housing - High Risk (04/16/2023)  Transportation Needs: No Transportation Needs (04/18/2023)  Utilities: Not At Risk (04/16/2023)  Depression (PHQ2-9): Low Risk  (01/26/2023)  Financial Resource Strain: Medium Risk (07/24/2020)   Received from [facility]  Tobacco Use: High Risk (04/15/2023)  Health Literacy: Low Risk  (10/02/2020)   Received from [facility]   SDOH Interventions: Food Insecurity Interventions: Intervention Not Indicated, Inpatient TOC Housing Interventions: Intervention Not Indicated, Inpatient TOC Transportation Interventions: Intervention Not Indicated, Inpatient TOC Utilities Interventions: Intervention Not Indicated, Inpatient TOC   Readmission Risk Interventions    04/18/2023     3:01 PM 01/21/2023   11:08 AM  Readmission Risk Prevention Plan  Transportation Screening Complete Complete  PCP or Specialist Appt within 5-7 Days Complete Complete  Home Care Screening Complete Complete  Medication Review (RN CM) Complete Complete

## 2023-04-19 DIAGNOSIS — I10 Essential (primary) hypertension: Secondary | ICD-10-CM

## 2023-04-19 DIAGNOSIS — L03116 Cellulitis of left lower limb: Secondary | ICD-10-CM | POA: Diagnosis not present

## 2023-04-19 DIAGNOSIS — S81801A Unspecified open wound, right lower leg, initial encounter: Secondary | ICD-10-CM

## 2023-04-19 DIAGNOSIS — E871 Hypo-osmolality and hyponatremia: Secondary | ICD-10-CM | POA: Diagnosis not present

## 2023-04-19 LAB — BASIC METABOLIC PANEL WITH GFR
Anion gap: 10 (ref 5–15)
BUN: <5 mg/dL — ABNORMAL LOW (ref 6–20)
CO2: 23 mmol/L (ref 22–32)
Calcium: 8.3 mg/dL — ABNORMAL LOW (ref 8.9–10.3)
Chloride: 87 mmol/L — ABNORMAL LOW (ref 98–111)
Creatinine, Ser: 0.42 mg/dL — ABNORMAL LOW (ref 0.61–1.24)
GFR, Estimated: >60 mL/min (ref >60)
Glucose, Bld: 109 mg/dL — ABNORMAL HIGH (ref 70–99)
Potassium: 3.5 mmol/L (ref 3.5–5.1)
Sodium: 120 mmol/L — ABNORMAL LOW (ref 135–145)

## 2023-04-19 LAB — LAMOTRIGINE LEVEL: Lamotrigine Lvl: 1.9 ug/mL — ABNORMAL LOW (ref 2.0–20.0)

## 2023-04-19 LAB — SODIUM
Sodium: 120 mmol/L — ABNORMAL LOW (ref 135–145)
Sodium: 121 mmol/L — ABNORMAL LOW (ref 135–145)
Sodium: 118 mmol/L — CL (ref 135–145)

## 2023-04-19 MED ORDER — CHLORHEXIDINE GLUCONATE CLOTH 2 % EX PADS
6.0000 | MEDICATED_PAD | Freq: Every day | CUTANEOUS | Status: DC
  Administered 2023-04-19 – 2023-04-21 (×3): 6 via TOPICAL

## 2023-04-19 MED ORDER — PANTOPRAZOLE SODIUM 40 MG PO TBEC
40.0000 mg | DELAYED_RELEASE_TABLET | Freq: Two times a day (BID) | ORAL | Status: DC
  Administered 2023-04-19 – 2023-04-23 (×8): 40 mg via ORAL
  Filled 2023-04-19 (×9): qty 1

## 2023-04-19 MED ORDER — SODIUM CHLORIDE 1 G PO TABS
2.0000 g | ORAL_TABLET | Freq: Once | ORAL | Status: AC
  Administered 2023-04-20: 2 g via ORAL
  Filled 2023-04-19: qty 2

## 2023-04-19 MED ORDER — METOPROLOL TARTRATE 25 MG PO TABS
25.0000 mg | ORAL_TABLET | Freq: Two times a day (BID) | ORAL | Status: DC
  Administered 2023-04-19 – 2023-04-20 (×3): 25 mg via ORAL
  Filled 2023-04-19 (×3): qty 1

## 2023-04-19 MED ORDER — SODIUM CHLORIDE 1 G PO TABS
2.0000 g | ORAL_TABLET | Freq: Once | ORAL | Status: AC
  Administered 2023-04-19: 2 g via ORAL
  Filled 2023-04-19: qty 2

## 2023-04-19 MED ORDER — DILTIAZEM HCL 90 MG PO TABS
180.0000 mg | ORAL_TABLET | Freq: Two times a day (BID) | ORAL | Status: DC
  Administered 2023-04-20 – 2023-04-23 (×7): 180 mg via ORAL
  Filled 2023-04-19 (×5): qty 2
  Filled 2023-04-19: qty 6
  Filled 2023-04-19 (×3): qty 2

## 2023-04-19 MED ORDER — HYDRALAZINE HCL 50 MG PO TABS
50.0000 mg | ORAL_TABLET | Freq: Four times a day (QID) | ORAL | Status: DC | PRN
  Filled 2023-04-19: qty 1

## 2023-04-19 NOTE — Progress Notes (Signed)
 [facility]  PROGRESS NOTE  Billy Anderson III YNW:[id number] DOB: 1965/02/28 DOA: 04/15/2023 PCP: Donell Beers, FNP   Brief HPI:   58 yo male Etoh abuse, Bipolar d/o, Psoriatic arthritis, Htn, Chronic hyponatremia presented 2/2 b/l le swelling and pain that began reportedly 2 days ago. He states that they have been weeping, and are warm to touch. Pt states he has not been eating or drinking well for past few days 2/2 dental issues. He endorses feeling weak and has been unable to move around much or clean his wounds on his le. Denies fever/chills/n/v/d. States he has been taking his medications as prescribed with exception of humira and salt tabs. He states other than weakness and le issues he denies any other complaints.     Assessment/Plan:   Hyponatremia, acute on chronic -Sodium admission was 108, previous sodium was 133 from January 2025. -That appears to baseline as sodium is in 130s. -Patient had very poor p.o. intake prior to admission. -Per discharge summary he was prescribed 2 g sodium 3 times daily in January 2025, however patient did not take it. -Patient was started on sodium chloride tablets along with normal saline -Sodium improved to 110 to 116 within 3 hours so NS gtt. and oral sodium chloride tablets were discontinued. -Will continue to check sodium every 6 hours -Started 1.2 L fluid restriction -Serum osmolality is low at 225; urine osmolality 317, urine sodium 37 -Sodium is slowly improving, it has improved to 120 today.   -We have been giving him salt tablets intermittently.   Rhabdomyolysis -CK 1,709 on admission with reports of poor mobility due to weakness prior to admission Repeat CK level  is 330    Le cellulitis Empiric Ancef Local wound care   Etoh use -Patient reports today 3/28 the last alcoholic drink was 6 months ago -Medication reconciliation not yet completed that appears patient is on naltrexone at baseline CIWA  protocol Monitor Seizure precautions   Psoriatic arthritis -It appears patient is on Humira at baseline Consider resuming home medications when medication reconciliation completed   Bipolar d/o -Home medications include Celexa, Lamictal, and Zyprexa Resume home medications when appropriate   Essential hypertension -Home medication includes Cardizem and low-dose aspirin Continuous telemetry Goal of euvolemia Optimize electrolytes -He was started on amlodipine.  Will discontinue amlodipine and restart Cardizem from tomorrow morning Start metoprolol 25 mg p.o. twice daily Hydralazine 50 mg p.o. every 6 hours as needed      Medications     amLODipine  10 mg Oral Daily   Chlorhexidine Gluconate Cloth  6 each Topical Daily   enoxaparin (LOVENOX) injection  40 mg Subcutaneous Q24H   folic acid  1 mg Oral Daily   multivitamin with minerals  1 tablet Oral Daily   pantoprazole  40 mg Oral Daily   sodium chloride  2 g Oral Once   thiamine  100 mg Oral Daily   Or   thiamine  100 mg Intravenous Daily     Data Reviewed:   CBG:  Recent Labs  Lab 04/15/23 2259  GLUCAP 77    SpO2: 96 %    Vitals:   04/19/23 0333 04/19/23 0358 04/19/23 0400 04/19/23 0702  BP: (!) 152/78  (!) 179/82 (!) 174/82  Pulse:      Resp: 15  14   Temp:  98.7 F (37.1 C)    TempSrc:  Oral    SpO2:      Weight:      Height:  Data Reviewed:  Basic Metabolic Panel: Recent Labs  Lab 04/15/23 2105 04/16/23 0235 04/16/23 0524 04/16/23 1914 04/17/23 0308 04/17/23 1136 04/17/23 2125 04/18/23 0304 04/18/23 1034 04/18/23 1632 04/19/23 0246  NA 108* 109* 109*   < > 118*   < > 118* 120* 121* 121* 120*  K 4.6 3.5 4.0  --  3.5  --   --   --   --   --  3.5  CL 72* 76* 74*  --  84*  --   --   --   --   --  87*  CO2 23 23 22   --  23  --   --   --   --   --  23  GLUCOSE 80 128* 93  --  99  --   --   --   --   --  109*  BUN <5* <5* <5*  --  <5*  --   --   --   --   --  <5*  CREATININE  0.44* 0.41* 0.48*  --  0.35*  --   --   --   --   --  0.42*  CALCIUM 8.5* 7.6* 8.2*  --  8.1*  --   --   --   --   --  8.3*  MG  --   --  1.8  --  1.7  --   --   --   --   --   --   PHOS  --   --   --   --  2.9  --   --   --   --   --   --    < > = values in this interval not displayed.    CBC: Recent Labs  Lab 04/15/23 2105 04/16/23 0524  WBC 10.4 10.8*  NEUTROABS 8.8*  --   HGB 13.0 13.3  HCT 35.2* 36.1*  MCV 91.4 92.1  PLT 303 288    LFT Recent Labs  Lab 04/15/23 2105 04/17/23 0308  AST 96* 52*  ALT 41 31  ALKPHOS 89 74  BILITOT 1.3* 0.4  PROT 6.6 6.0*  ALBUMIN 3.5 2.9*     Antibiotics: Anti-infectives (From admission, onward)    Start     Dose/Rate Route Frequency Ordered Stop   04/16/23 0600  ceFAZolin (ANCEF) IVPB 2g/100 mL premix        2 g 200 mL/hr over 30 Minutes Intravenous Every 8 hours 04/16/23 0528 04/23/23 0559   04/15/23 2230  cefTRIAXone (ROCEPHIN) 2 g in sodium chloride 0.9 % 100 mL IVPB        2 g 200 mL/hr over 30 Minutes Intravenous  Once 04/15/23 2226 04/15/23 2346        DVT prophylaxis: Heparin  Code Status: Full code  Family Communication:    CONSULTS    Subjective    Denies any complaints.  Objective    Physical Examination:  General-appears in no acute distress Heart-S1-S2, regular, no murmur auscultated Lungs-clear to auscultation bilaterally, no wheezing or crackles auscultated Abdomen-soft, nontender, no organomegaly Extremities-no edema in the lower extremities Neuro-alert, oriented x3, no focal deficit noted   Status is: Inpatient:             Meredeth Ide   [facility] If 7PM-7AM, please contact night-coverage at [email or web address], Office  [phone]   04/19/2023, 7:39 AM  LOS: 3 days

## 2023-04-19 NOTE — Progress Notes (Signed)
 Physical Therapy Treatment Patient Details Name: Billy Anderson MRN: [id number] DOB: 1965-04-13 Today's Date: 04/19/2023   History of Present Illness Pt admitted from home with LE edema/cellulitis, Rhabdomyolysis, hyponatremia, hypocalcemia and inability to stand 2* bil foot pain with WB. PMH: hyponatremia, seizures, bipolar, dysrythmia, Pneumonia, ACDF    PT Comments  The patient reports that he  has not been able to sleep, can not get comfortable. Patient agreeable to mobilize to recliner using a RW. Limited by pain in the LLE with WB. Will attempt progressive ambulation with Rw next visit.    If plan is discharge home, recommend the following: A little help with walking and/or transfers;A little help with bathing/dressing/bathroom;Assistance with cooking/housework;Assist for transportation;Help with stairs or ramp for entrance   Can travel by private vehicle        Equipment Recommendations  None recommended by PT    Recommendations for Other Services       Precautions / Restrictions Precautions Precautions: Fall Precaution/Restrictions Comments: seizures Restrictions Weight Bearing Restrictions Per Provider Order: No     Mobility  Bed Mobility Overal bed mobility: Needs Assistance             General bed mobility comments: unassisted supine<sit    Transfers Overall transfer level: Needs assistance Equipment used: Rolling walker (2 wheels) Transfers: Sit to/from Stand Sit to Stand: Min assist           General transfer comment: Steady assist with cues for use of UEs to self assist    Ambulation/Gait Ambulation/Gait assistance: Min assist Gait Distance (Feet): 5 Feet   Gait Pattern/deviations: Step-to pattern, Decreased step length - right, Decreased step length - left, Shuffle, Trunk flexed       General Gait Details: antalgic and limited tolerance   Stairs             Wheelchair Mobility     Tilt Bed    Modified Rankin (Stroke  Patients Only)       Balance   Sitting-balance support: No upper extremity supported, Feet supported Sitting balance-Leahy Scale: Good     Standing balance support: Bilateral upper extremity supported, Reliant on assistive device for balance, During functional activity Standing balance-Leahy Scale: Poor                              Communication Communication Communication: No apparent difficulties  Cognition   Behavior During Therapy: WFL for tasks assessed/performed, Impulsive, Flat affect   PT - Cognitive impairments: No apparent impairments                         Following commands: Intact      Cueing    Exercises      General Comments        Pertinent Vitals/Pain Pain Assessment Pain Assessment: Faces Faces Pain Scale: Hurts even more Pain Location: bilat feet with WB Pain Descriptors / Indicators: Aching, Grimacing, Sore Pain Intervention(s): Limited activity within patient's tolerance, Monitored during session    Home Living                          Prior Function            PT Goals (current goals can now be found in the care plan section) Progress towards PT goals: Progressing toward goals    Frequency    Min 3X/week  PT Plan      Co-evaluation              AM-PAC PT "6 Clicks" Mobility   Outcome Measure  Help needed turning from your back to your side while in a flat bed without using bedrails?: None Help needed moving from lying on your back to sitting on the side of a flat bed without using bedrails?: None Help needed moving to and from a bed to a chair (including a wheelchair)?: A Little Help needed standing up from a chair using your arms (e.g., wheelchair or bedside chair)?: A Little Help needed to walk in hospital room?: Total Help needed climbing 3-5 steps with a railing? : Total 6 Click Score: 16    End of Session   Activity Tolerance: Patient limited by pain Patient left: in  chair;with call bell/phone within reach;with chair alarm set Nurse Communication: Mobility status PT Visit Diagnosis: Difficulty in walking, not elsewhere classified (R26.2)     Time: 4098-1191 PT Time Calculation (min) (ACUTE ONLY): 14 min  Charges:    $Therapeutic Activity: 8-22 mins PT General Charges $$ ACUTE PT VISIT: 1 Visit                     Blanchard Kelch PT Acute Rehabilitation Services Office [phone]     Rada Hay 04/19/2023, 12:48 PM

## 2023-04-20 DIAGNOSIS — E871 Hypo-osmolality and hyponatremia: Secondary | ICD-10-CM | POA: Diagnosis not present

## 2023-04-20 DIAGNOSIS — L03116 Cellulitis of left lower limb: Secondary | ICD-10-CM | POA: Diagnosis not present

## 2023-04-20 DIAGNOSIS — I10 Essential (primary) hypertension: Secondary | ICD-10-CM | POA: Diagnosis not present

## 2023-04-20 LAB — CULTURE, BLOOD (ROUTINE X 2)
Culture: NO GROWTH
Culture: NO GROWTH

## 2023-04-20 LAB — SODIUM
Sodium: 119 mmol/L — CL (ref 135–145)
Sodium: 119 mmol/L — CL (ref 135–145)
Sodium: 125 mmol/L — ABNORMAL LOW (ref 135–145)

## 2023-04-20 MED ORDER — TOLVAPTAN 15 MG PO TABS
15.0000 mg | ORAL_TABLET | Freq: Once | ORAL | Status: AC
  Administered 2023-04-20: 15 mg via ORAL
  Filled 2023-04-20: qty 1

## 2023-04-20 MED ORDER — SODIUM CHLORIDE 1 G PO TABS
2.0000 g | ORAL_TABLET | Freq: Three times a day (TID) | ORAL | Status: DC
  Administered 2023-04-20: 2 g via ORAL
  Filled 2023-04-20: qty 2

## 2023-04-20 MED ORDER — SODIUM CHLORIDE 1 G PO TABS: 2.0000 g | ORAL_TABLET | Freq: Once | ORAL | Status: DC

## 2023-04-20 NOTE — Evaluation (Signed)
 Occupational Therapy Evaluation Patient Details Name: Billy Anderson MRN: [id number] DOB: [birth year] Today's Date: 04/20/2023   History of Present Illness   Pt admitted from home with LE edema/cellulitis, Rhabdomyolysis, hyponatremia, hypocalcemia and inability to stand 2* bil foot pain with WB. PMH: hyponatremia, seizures, bipolar, dysrythmia, Pneumonia, ACDF     Clinical Impressions PTA, patient was living at home with GF, is on disability and used rollator for mobility and required some intermittent assistance for BADL's and assist for IADL's and transportation. Patient reports he was having HH wound care prior to admission but patient unsure of exact timeline. Currently, patient presents with activity tolerance, generalized strength, balance deficits with fluctuating levels of L Le pain- none reported this am with ADL training session and mobility. Patient will require continued Acute level hospital based OT services and recommending HHOT and family support and assistance upon discharge to home setting.      If plan is discharge home, recommend the following:   A lot of help with walking and/or transfers;A little help with bathing/dressing/bathroom;Assistance with cooking/housework;Direct supervision/assist for medications management;Direct supervision/assist for financial management;Assist for transportation;Help with stairs or ramp for entrance     Functional Status Assessment   Patient has had a recent decline in their functional status and demonstrates the ability to make significant improvements in function in a reasonable and predictable amount of time.     Equipment Recommendations   None recommended by OT      Precautions/Restrictions   Precautions Precautions: Fall Recall of Precautions/Restrictions: Intact Precaution/Restrictions Comments: seizures Restrictions Weight Bearing Restrictions Per Provider Order: No     Mobility Bed Mobility Overal bed  mobility: Needs Assistance Bed Mobility: Supine to Sit     Supine to sit: Supervision          Transfers Overall transfer level: Needs assistance Equipment used: Rolling walker (2 wheels) Transfers: Sit to/from Stand Sit to Stand: Min assist           General transfer comment: Steady assist with cues for use of UEs to self assist      Balance Overall balance assessment: Needs assistance Sitting-balance support: No upper extremity supported, Feet supported Sitting balance-Leahy Scale: Good     Standing balance support: Bilateral upper extremity supported, Reliant on assistive device for balance, During functional activity Standing balance-Leahy Scale: Poor                             ADL either performed or assessed with clinical judgement   ADL Overall ADL's : Needs assistance/impaired Eating/Feeding: Modified independent;Bed level   Grooming: Wash/dry hands;Wash/dry face;Oral care;Applying deodorant;Brushing hair;Sitting;Set up   Upper Body Bathing: Supervision/ safety;Sitting   Lower Body Bathing: Minimal assistance;Sitting/lateral leans;Sit to/from stand   Upper Body Dressing : Contact guard assist;Sitting   Lower Body Dressing: Minimal assistance;Sitting/lateral leans;Sit to/from stand   Toilet Transfer: Minimal assistance;Grab bars;Rolling walker (2 wheels)   Toileting- Clothing Manipulation and Hygiene: Minimal assistance       Functional mobility during ADLs: Minimal assistance;Rolling walker (2 wheels);Cueing for safety;Cueing for sequencing General ADL Comments: decreased functional reach to LE's     Vision Baseline Vision/History: 1 Wears glasses Ability to See in Adequate Light: 0 Adequate Patient Visual Report: No change from baseline Vision Assessment?: No apparent visual deficits     Perception Perception: Within Functional Limits       Praxis Praxis: WFL       Pertinent Vitals/Pain Pain Assessment Pain  Assessment:  No/denies pain     Extremity/Trunk Assessment Upper Extremity Assessment Upper Extremity Assessment: Right hand dominant (L hand 5th digit contracture from an old dislocation injury does not impact function)   Lower Extremity Assessment Lower Extremity Assessment: Generalized weakness;Defer to PT evaluation   Cervical / Trunk Assessment Cervical / Trunk Assessment: Normal   Communication Communication Communication: No apparent difficulties   Cognition Arousal: Alert Behavior During Therapy: WFL for tasks assessed/performed, Impulsive, Flat affect Cognition: No apparent impairments             OT - Cognition Comments: slower processing but overall WFL's                 Following commands: Intact       Cueing  General Comments   Cueing Techniques: Verbal cues  L LE with dressing around shin/calf and sock intact           Home Living Family/patient expects to be discharged to:: Private residence Living Arrangements: Spouse/significant other Available Help at Discharge: Family;Available PRN/intermittently Type of Home: House Home Access: Stairs to enter [employer] of Steps: 5 Entrance Stairs-Rails: Right;Left Home Layout: One level     Bathroom Shower/Tub: Producer, television/film/video: Handicapped height     Home Equipment: Agricultural consultant (2 wheels);Rollator (4 wheels);Cane - single point;Shower seat   Additional Comments: SO is home during the day      Prior Functioning/Environment Prior Level of Function : Independent/Modified Independent             Mobility Comments: using rollator ADLs Comments: Assist of family as needed    OT Problem List: Decreased strength;Decreased activity tolerance;Impaired balance (sitting and/or standing)   OT Treatment/Interventions: Self-care/ADL training;Therapeutic exercise;Neuromuscular education;Energy conservation;DME and/or AE instruction;Therapeutic activities;Patient/family  education;Balance training      OT Goals(Current goals can be found in the care plan section)   Acute Rehab OT Goals Patient Stated Goal: to get stronger OT Goal Formulation: With patient Time For Goal Achievement: 05/04/23 Potential to Achieve Goals: Good   OT Frequency:  Min 2X/week       AM-PAC OT "6 Clicks" Daily Activity     Outcome Measure Help from another person eating meals?: A Little Help from another person taking care of personal grooming?: A Little Help from another person toileting, which includes using toliet, bedpan, or urinal?: A Little Help from another person bathing (including washing, rinsing, drying)?: A Little Help from another person to put on and taking off regular upper body clothing?: A Little Help from another person to put on and taking off regular lower body clothing?: A Little 6 Click Score: 18   End of Session Equipment Utilized During Treatment: Gait belt;Rolling walker (2 wheels) Nurse Communication: Mobility status  Activity Tolerance: Patient tolerated treatment well Patient left: in bed;with call bell/phone within reach;with bed alarm set (patient reported he did not sleep well and requested to lay back down after session)  OT Visit Diagnosis: Unsteadiness on feet (R26.81);Muscle weakness (generalized) (M62.81)                Time: 0820-0900 OT Time Calculation (min): 40 min Charges:  OT General Charges $OT Visit: 1 Visit OT Evaluation $OT Eval Moderate Complexity: 1 Mod OT Treatments $Self Care/Home Management : 23-37 mins Jayvian Escoe OT/L Acute Rehabilitation Department  [phone]  04/20/2023, 1:22 PM

## 2023-04-20 NOTE — Progress Notes (Signed)
 Physical Therapy Treatment Patient Details Name: Billy Anderson MRN: [id number] DOB: [birth year] Today's Date: 04/20/2023   History of Present Illness Pt admitted from home with LE edema/cellulitis, Rhabdomyolysis, hyponatremia, hypocalcemia and inability to stand 2* bil foot pain with WB. PMH: hyponatremia, seizures, bipolar, dysrythmia, Pneumonia, ACDF    PT Comments  Pt agreeable to therapy, demonstrates significant improvement this date. Pt comes to sitting EOB without assistance, therapist managing lines for safety. Pt powers to stand with BUE assisting, CGA for safety. Pt amb ~80 ft with RW, no LE buckling noted, maintains L toes curled up off of floor, needs increased time and steps with turns, no overt LOB. Pt denies dizziness with positional changes, standing and ambulation; no complaints during session. Encouraged pt in time OOB and nursing assisting with mobility as tolerable. Cont to recommend HHPT with family support at d/c.   If plan is discharge home, recommend the following: A little help with walking and/or transfers;A little help with bathing/dressing/bathroom;Assistance with cooking/housework;Assist for transportation;Help with stairs or ramp for entrance   Can travel by private vehicle        Equipment Recommendations  None recommended by PT    Recommendations for Other Services       Precautions / Restrictions Precautions Precautions: Fall Recall of Precautions/Restrictions: Intact Precaution/Restrictions Comments: seizures Restrictions Weight Bearing Restrictions Per Provider Order: No     Mobility  Bed Mobility Overal bed mobility: Needs Assistance Bed Mobility: Supine to Sit     Supine to sit: Supervision     General bed mobility comments: supv for supine to sit, therapist managing lines for safety    Transfers Overall transfer level: Needs assistance Equipment used: Rolling walker (2 wheels) Transfers: Sit to/from Stand Sit to Stand: Contact  guard assist           General transfer comment: CGA to power to stand, maintains L toes curled off of floor, powers up with BUE assisting    Ambulation/Gait Ambulation/Gait assistance: Contact guard assist Gait Distance (Feet): 80 Feet Assistive device: Rolling walker (2 wheels) Gait Pattern/deviations: Step-through pattern, Decreased stride length, Trunk flexed, Wide base of support Gait velocity: decreased     General Gait Details: step through gait pattern, wide BOS, maintains L toes curled off of floor, trunk slightly forward flexed, increased time and steps with turns, no LE buckling or overt LOB   Stairs             Wheelchair Mobility     Tilt Bed    Modified Rankin (Stroke Patients Only)       Balance Overall balance assessment: Needs assistance Sitting-balance support: No upper extremity supported, Feet supported Sitting balance-Leahy Scale: Good     Standing balance support: Bilateral upper extremity supported, Reliant on assistive device for balance, During functional activity Standing balance-Leahy Scale: Poor                              Communication Communication Communication: No apparent difficulties  Cognition Arousal: Alert Behavior During Therapy: Flat affect   PT - Cognitive impairments: No family/caregiver present to determine baseline                       PT - Cognition Comments: pt flat, responds to questions with increased time Following commands: Intact      Cueing Cueing Techniques: Verbal cues  Exercises      General Comments  Pertinent Vitals/Pain Pain Assessment Pain Assessment: No/denies pain    Home Living Family/patient expects to be discharged to:: Private residence Living Arrangements: Spouse/significant other Available Help at Discharge: Family;Available PRN/intermittently Type of Home: House Home Access: Stairs to enter Entrance Stairs-Rails: Conservation officer, historic buildings of Steps: 5   Home Layout: One level Home Equipment: Agricultural consultant (2 wheels);Rollator (4 wheels);Cane - single point;Shower seat Additional Comments: SO is home during the day    Prior Function            PT Goals (current goals can now be found in the care plan section) Acute Rehab PT Goals Patient Stated Goal: RegaiN IND PT Goal Formulation: With patient Time For Goal Achievement: 04/29/23 Potential to Achieve Goals: Good Progress towards PT goals: Progressing toward goals    Frequency    Min 3X/week      PT Plan      Co-evaluation              AM-PAC PT "6 Clicks" Mobility   Outcome Measure  Help needed turning from your back to your side while in a flat bed without using bedrails?: None Help needed moving from lying on your back to sitting on the side of a flat bed without using bedrails?: None Help needed moving to and from a bed to a chair (including a wheelchair)?: A Little Help needed standing up from a chair using your arms (e.g., wheelchair or bedside chair)?: A Little Help needed to walk in hospital room?: A Little Help needed climbing 3-5 steps with a railing? : A Little 6 Click Score: 20    End of Session Equipment Utilized During Treatment: Gait belt Activity Tolerance: Patient tolerated treatment well Patient left: in chair;with call bell/phone within reach;with chair alarm set;with nursing/sitter in room Nurse Communication: Mobility status PT Visit Diagnosis: Difficulty in walking, not elsewhere classified (R26.2)     Time: 1057-1110 PT Time Calculation (min) (ACUTE ONLY): 13 min  Charges:    $Gait Training: 8-22 mins PT General Charges $$ ACUTE PT VISIT: 1 Visit                     Tori Rosaire Cueto PT, DPT 04/20/23, 11:32 AM

## 2023-04-20 NOTE — Progress Notes (Signed)
 [facility]  PROGRESS NOTE  Billy Anderson ZOX:[id number] DOB: 09/25/1965 DOA: 04/15/2023 PCP: Donell Beers, FNP   Brief HPI:   58 yo male Etoh abuse, Bipolar d/o, Psoriatic arthritis, Htn, Chronic hyponatremia presented 2/2 b/l le swelling and pain that began reportedly 2 days ago. He states that they have been weeping, and are warm to touch. Pt states he has not been eating or drinking well for past few days 2/2 dental issues. He endorses feeling weak and has been unable to move around much or clean his wounds on his le. Denies fever/chills/n/v/d. States he has been taking his medications as prescribed with exception of humira and salt tabs. He states other than weakness and le issues he denies any other complaints.     Assessment/Plan:   Hyponatremia, acute on chronic -Sodium admission was 108, previous sodium was 133 from January 2025. -That appears to baseline as sodium is in 130s. -Patient had very poor p.o. intake prior to admission. -Per discharge summary he was prescribed 2 g sodium 3 times daily in January 2025, however patient did not take it. -Patient was started on sodium chloride tablets along with normal saline -Sodium improved to 110 to 116 within 3 hours so NS gtt. and oral sodium chloride tablets were discontinued. -Will continue to check sodium every 6 hours -Started 1.2 L fluid restriction -Serum osmolality is low at 225; urine osmolality 317, urine sodium 37 -Sodium is slowly improving, it has improved to 120 today.   -We have been giving him salt tablets intermittently. -Discussed with Dr. Arlean Hopping, Will start scheduled sodium chloride tablets 2 g p.o. 3 times daily.  Change fluid restriction to 1 L/day -Nephrology to see in consult.   Rhabdomyolysis -CK 1,709 on admission with reports of poor mobility due to weakness prior to admission Repeat CK level  is 330    Le cellulitis Empiric Ancef Local wound care -Significantly improved   Etoh  use -Patient reports today 3/28 the last alcoholic drink was 6 months ago -Medication reconciliation not yet completed that appears patient is on naltrexone at baseline CIWA protocol Monitor Seizure precautions   Psoriatic arthritis -It appears patient is on Humira at baseline Consider resuming home medications when medication reconciliation completed   Bipolar d/o -Home medications include Celexa, Lamictal, and Zyprexa -Will hold his medications due to hyponatremia as above   Essential hypertension -Home medication includes Cardizem and low-dose aspirin Continuous telemetry Goal of euvolemia Optimize electrolytes -He was started on amlodipine.  Amlodipine has been discontinued and patient started back on Cardizem  Will hold metoprolol as blood pressure has improved with Cardizem. Hydralazine 50 mg p.o. every 6 hours as needed      Medications     Chlorhexidine Gluconate Cloth  6 each Topical Q2200   diltiazem  180 mg Oral Q12H   enoxaparin (LOVENOX) injection  40 mg Subcutaneous Q24H   folic acid  1 mg Oral Daily   metoprolol tartrate  25 mg Oral BID   multivitamin with minerals  1 tablet Oral Daily   pantoprazole  40 mg Oral BID   sodium chloride  2 g Oral TID WC   thiamine  100 mg Oral Daily   Or   thiamine  100 mg Intravenous Daily     Data Reviewed:   CBG:  Recent Labs  Lab 04/15/23 2259  GLUCAP 77    SpO2: 98 %    Vitals:   04/19/23 1146 04/19/23 1951 04/20/23 0420 04/20/23 1123  BP:  131/72 134/75 (!) 156/89 114/72  Pulse: (!) 51 61 (!) 55 (!) 57  Resp:  17 17   Temp: 98 F (36.7 C) 97.8 F (36.6 C) 97.7 F (36.5 C) 97.8 F (36.6 C)  TempSrc: Oral  Oral   SpO2: 98% 99% 98% 98%  Weight:      Height:          Data Reviewed:  Basic Metabolic Panel: Recent Labs  Lab 04/15/23 2105 04/16/23 0235 04/16/23 0524 04/16/23 0842 04/17/23 0308 04/17/23 1136 04/19/23 0246 04/19/23 1107 04/19/23 1650 04/19/23 2259 04/20/23 0554  NA 108*  109* 109*   < > 118*   < > 120* 120* 121* 118* 119*  K 4.6 3.5 4.0  --  3.5  --  3.5  --   --   --   --   CL 72* 76* 74*  --  84*  --  87*  --   --   --   --   CO2 23 23 22   --  23  --  23  --   --   --   --   GLUCOSE 80 128* 93  --  99  --  109*  --   --   --   --   BUN <5* <5* <5*  --  <5*  --  <5*  --   --   --   --   CREATININE 0.44* 0.41* 0.48*  --  0.35*  --  0.42*  --   --   --   --   CALCIUM 8.5* 7.6* 8.2*  --  8.1*  --  8.3*  --   --   --   --   MG  --   --  1.8  --  1.7  --   --   --   --   --   --   PHOS  --   --   --   --  2.9  --   --   --   --   --   --    < > = values in this interval not displayed.    CBC: Recent Labs  Lab 04/15/23 2105 04/16/23 0524  WBC 10.4 10.8*  NEUTROABS 8.8*  --   HGB 13.0 13.3  HCT 35.2* 36.1*  MCV 91.4 92.1  PLT 303 288    LFT Recent Labs  Lab 04/15/23 2105 04/17/23 0308  AST 96* 52*  ALT 41 31  ALKPHOS 89 74  BILITOT 1.3* 0.4  PROT 6.6 6.0*  ALBUMIN 3.5 2.9*     Antibiotics: Anti-infectives (From admission, onward)    Start     Dose/Rate Route Frequency Ordered Stop   04/16/23 0600  ceFAZolin (ANCEF) IVPB 2g/100 mL premix        2 g 200 mL/hr over 30 Minutes Intravenous Every 8 hours 04/16/23 0528 04/23/23 0559   04/15/23 2230  cefTRIAXone (ROCEPHIN) 2 g in sodium chloride 0.9 % 100 mL IVPB        2 g 200 mL/hr over 30 Minutes Intravenous  Once 04/15/23 2226 04/15/23 2346        DVT prophylaxis: Heparin  Code Status: Full code  Family Communication:    CONSULTS    Subjective    Denies any complaints.  Sodium still low at 119.  Objective    Physical Examination:  General-appears in no acute distress Heart-S1-S2, regular, no murmur auscultated Lungs-clear to auscultation bilaterally, no wheezing or crackles  auscultated Abdomen-soft, nontender, no organomegaly Extremities-no edema in the lower extremities Neuro-alert, oriented x3, no focal deficit noted   Status is: Inpatient:              Meredeth Ide   [facility] If 7PM-7AM, please contact night-coverage at [email or web address], Office  [phone]   04/20/2023, 12:12 PM  LOS: 4 days

## 2023-04-20 NOTE — Progress Notes (Signed)
 [facility] medication guide printed and reviewed with patient at this time. All of his questions and concerns were addressed. He was given instructions to immediately notify staff if he starts having any adverse side effects. He verbalized understanding. Printed guide left at bedside for further patient education.

## 2023-04-20 NOTE — Consult Note (Signed)
 Renal Service Consult Note [facility]  Billy Anderson 04/20/2023 Maree Krabbe, MD Requesting Physician: Dr. Sharl Ma  Reason for Consult: Hyponatremia  HPI: The patient is a 58 y.o. year-old w/ PMH as below who presented to ED c/o bilateral leg swelling w/ some weeping, red and hot to touch. BP's high in ED. He was taking his meds at home except the humira and salt tabs. Hx of etoh abuse but denied use for a few days. No withdrawal for years. In ED labs showed Na+ 108 (133 from jan 2025). He was rx'd salt tabs in Jan 2025 2gm tid, however he did not take it. Pt was admitted and started on NaCl tablets and normal saline. Na+ improved to 116 after 6 hrs but it was felt to too rapid of improvement, so he was started on IV D5W. Serum osm 225, Urine osm was 317 and UNa 37.  Today Na+ is up to 120. We are asked to see for hyponatremia.   Pt seen in room. He denies any hx of CHF, cirrhosis. Has some L lower leg swelling, it is wrapped and he says he had "infection" in that leg. Denies any etoh intake recently.   Hospital admit,m Jan 2025 -- admitted for bilat LE cellulitis, HTN, hyponatremia, psoriatic arthritis, PAF, bipolar 1 d/o,  spinal stenosis, seizures, etoh abuse   PMH Bipolar d/o Psoriatic arthritis HTN GERD PNA Seizures   ROS - denies CP, no joint pain, no HA, no blurry vision, no rash, no diarrhea, no nausea/ vomiting   Past Medical History  Past Medical History:  Diagnosis Date   Arthritis    psoriatic arthritis   Bipolar disorder (HCC)    Dysrhythmia    PAF   GERD (gastroesophageal reflux disease)    Hypertension    Pneumonia    Seizures (HCC)    Past Surgical History  Past Surgical History:  Procedure Laterality Date   ANTERIOR CERVICAL DECOMP/DISCECTOMY FUSION N/A 03/20/2022   Procedure: Cervical Three-Four, Cervical Four-Five Anterior Cervical Discectomy Fusion;  Surgeon: Coletta Memos, MD;  Location: [facility] OR;  Service: Neurosurgery;  Laterality:  N/A;  RM 20 to follow 3C   HAND SURGERY Left    carpal tunnel release   Family History  Family History  Problem Relation Age of Onset   Bipolar disorder Mother    Social History  reports that he has been smoking cigarettes. He does not have any smokeless tobacco history on file. He reports that he does not currently use alcohol after a past usage of about 48.0 standard drinks of alcohol per week. He reports that he does not use drugs. Allergies  Allergies  Allergen Reactions   Ativan [Lorazepam] Other (See Comments)    Confusion  Hallucinations    Roxicodone [Oxycodone] Itching   Ultram [Tramadol] Other (See Comments)    Seizures    Vistaril [Hydroxyzine] Itching and Other (See Comments)    Sedation Not effective in reducing anxiety   Home medications Prior to Admission medications   Medication Sig Start Date End Date Taking? Authorizing Provider  aspirin EC 81 MG tablet Take 1 tablet (81 mg total) by mouth daily. Patient taking differently: Take 81 mg by mouth at bedtime. 10/31/21  Yes Love, Evlyn Kanner, PA-C  Calcium Carb-Cholecalciferol (CALCIUM 600+D3 PO) Take 1 tablet by mouth 2 (two) times daily.   Yes [provider]  citalopram (CELEXA) 10 MG tablet Take 1 tablet (10 mg total) by mouth at bedtime. 11/11/22  Yes Arfeen,  Phillips Grout, MD  diltiazem (CARDIZEM CD) 180 MG 24 hr capsule TAKE 2 CAPSULES BY MOUTH DAILY Patient taking differently: Take 180 mg by mouth in the morning and at bedtime. 02/08/23  Yes Paseda, Baird Kay, FNP  Fexofenadine HCl (ALLEGRA PO) Take 180 mg by mouth daily.   Yes [provider]  folic acid (FOLVITE) 1 MG tablet Take 1 tablet (1 mg total) by mouth daily. Patient taking differently: Take 1 mg by mouth in the morning and at bedtime. 01/26/23  Yes Paseda, Baird Kay, FNP  gabapentin (NEURONTIN) 100 MG capsule Take 1 capsule (100 mg total) by mouth 2 (two) times daily for 10 days. Patient taking differently: Take 100 mg by mouth 3 (three)  times daily. 01/26/23 04/16/23 Yes Paseda, Folashade R, FNP  HUMIRA, 2 PEN, 40 MG/0.8ML AJKT pen Inject 0.8 mLs into the skin every 14 (fourteen) days. 01/01/23  Yes [provider]  lamoTRIgine (LAMICTAL) 100 MG tablet TAKE TWO TABLETS BY MOUTH AT BEDTIME 02/01/23  Yes Camara, Amalia Hailey, MD  Multiple Vitamin (MULTIVITAMIN) tablet Take 1 tablet by mouth daily. Patient taking differently: Take 1 tablet by mouth in the morning and at bedtime. 10/22/21  Yes Almon Hercules, MD  naltrexone (DEPADE) 50 MG tablet Take 1 tablet (50 mg total) by mouth daily. 11/11/22  Yes Arfeen, Phillips Grout, MD  OLANZapine (ZYPREXA) 20 MG tablet Take 1 tablet (20 mg total) by mouth at bedtime. 11/11/22  Yes Arfeen, Phillips Grout, MD  oxybutynin (DITROPAN-XL) 10 MG 24 hr tablet TAKE 1 TABLET BY MOUTH AT BEDTIME 02/01/23  Yes Paseda, Folashade R, FNP  pantoprazole (PROTONIX) 40 MG tablet Take 1 tablet (40 mg total) by mouth daily. 03/17/23  Yes Paseda, Baird Kay, FNP  pyridOXINE (VITAMIN B6) 100 MG tablet Take 1 tablet (100 mg total) by mouth daily. 10/31/21  Yes Love, Evlyn Kanner, PA-C  thiamine (VITAMIN B-1) 100 MG tablet Take 1 tablet (100 mg total) by mouth daily. 01/22/23  Yes Rolly Salter, MD  tiZANidine (ZANAFLEX) 4 MG tablet Take 4 mg by mouth daily as needed for muscle spasms. 01/31/23  Yes [provider]  halobetasol (ULTRAVATE) 0.05 % cream Apply topically. Patient not taking: Reported on 04/16/2023 04/08/23   [provider]  sodium chloride 1 g tablet Take 2 tablets (2 g total) by mouth 3 (three) times daily with meals. Patient not taking: Reported on 04/16/2023 01/26/23   Edwin Dada R, FNP     Vitals:   04/19/23 1146 04/19/23 1951 04/20/23 0420 04/20/23 1123  BP: 131/72 134/75 (!) 156/89 114/72  Pulse: (!) 51 61 (!) 55 (!) 57  Resp:  17 17   Temp: 98 F (36.7 C) 97.8 F (36.6 C) 97.7 F (36.5 C) 97.8 F (36.6 C)  TempSrc: Oral  Oral   SpO2: 98% 99% 98% 98%  Weight:      Height:        Exam Gen alert, no distress No rash, cyanosis or gangrene Sclera anicteric, throat clear  No jvd or bruits Chest clear bilat to bases, no rales/ wheezing RRR no MRG Abd soft ntnd no mass or ascites +bs GU normal male MS no joint effusions or deformity Ext no RLE edema, 1+ LLE pretib edema/ wrapped,m no other edema Neuro is alert, Ox 3 , nf       Renal-related home meds: Cardizem CD 180 bid Salt tabs 2 gm tid Others: PPI, zyprexa, naltrexone, lamictal, humira, gabapentin, celexa, asa  Urine lytes from 01/19/23  were --> UOsm 406, UNa 90 Urine lytes from 04/15/23 were --> UOsm 317, UNa 37 Repeat 04/16/23   -->  UOsm 311, UNa 62    Assessment/ Plan: Hyponatremia - Na+ 119-120 here in patient w/ hx etoh abuse, seizures, depression. He is euvolemic on exam except for mild L lower leg swelling prob due to infection. Doubt CHF or cirrhosis. Cortisol and TSH are wnl. Etoh abuse can be associated w/ low solute hyponatremia, but the urine osm should be sig lower (< 100). He is on SSRI which may be the culprit. Would dc the SSRI. Will hold salt tabs/ fluid restrict for 24 hrs and give tolvaptan 15mg  x 1 this afternoon. Get Na+ levels every 6 hrs. Will follow.  H/o etoh abuse Bipolar d/o Seizure d/o - on lamictal      Rob Janisse Ghan  MD [facility] 04/20/2023, 1:42 PM  Recent Labs  Lab 04/15/23 2105 04/16/23 0235 04/16/23 0524 04/17/23 0308 04/19/23 0246  HGB 13.0  --  13.3  --   --   ALBUMIN 3.5  --   --  2.9*  --   CALCIUM 8.5*   < > 8.2* 8.1* 8.3*  PHOS  --   --   --  2.9  --   CREATININE 0.44*   < > 0.48* 0.35* 0.42*  K 4.6   < > 4.0 3.5 3.5   < > = values in this interval not displayed.   Inpatient medications:  Chlorhexidine Gluconate Cloth  6 each Topical Q2200   diltiazem  180 mg Oral Q12H   enoxaparin (LOVENOX) injection  40 mg Subcutaneous Q24H   folic acid  1 mg Oral Daily   multivitamin with minerals  1 tablet Oral Daily   pantoprazole  40 mg Oral BID   sodium chloride   2 g Oral TID WC   thiamine  100 mg Oral Daily   Or   thiamine  100 mg Intravenous Daily     ceFAZolin (ANCEF) IV 2 g (04/20/23 0510)   docusate sodium, hydrALAZINE, melatonin, mouth rinse, polyethylene glycol

## 2023-04-20 NOTE — TOC Progression Note (Signed)
 Transition of Care [facility]) - Progression Note    Patient Details  Name: Billy Anderson MRN: [id number] Date of Birth: [birth year]  Transition of [facility] CM/SW Contact  Otelia Santee, LCSW Phone Number: 04/20/2023, 10:41 AM  Clinical Narrative:    Pt agreeable to [facility] recommendation. Pt reports receiving HHRN through [facility] in the past and is agreeable to have HH arranged with them again. HHPT/OT/RN has been arranged with [facility]. HH orders will need to be placed prior to discharge.    Expected Discharge Plan: Home/Self Care Barriers to Discharge: Barriers Resolved  Expected Discharge Plan and Services In-house Referral: NA Discharge Planning Services: NA Post Acute Care Choice: Home Health Living arrangements for the past 2 months: Single Family Home                 DME Arranged: N/A DME Agency: NA       HH Arranged: RN, PT, OT HH Agency: [facility] Date [facility] Agency Contacted: 04/20/23 Time HH Agency Contacted: 1041 Representative spoke with at [facility] Agency: Cindie   Social Determinants of Health (SDOH) Interventions SDOH Screenings   Food Insecurity: No Food Insecurity (04/18/2023)  Housing: Low Risk  (04/18/2023)  Recent Concern: Housing - High Risk (04/16/2023)  Transportation Needs: No Transportation Needs (04/18/2023)  Utilities: Not At Risk (04/16/2023)  Depression (PHQ2-9): Low Risk  (01/26/2023)  Financial Resource Strain: Medium Risk (07/24/2020)   Received from [facility]  Tobacco Use: High Risk (04/15/2023)  Health Literacy: Low Risk  (10/02/2020)   Received from [facility]    Readmission Risk Interventions    04/18/2023    3:01 PM 01/21/2023   11:08 AM  Readmission Risk Prevention Plan  Transportation Screening Complete Complete  PCP or Specialist Appt within 5-7 Days Complete Complete  Home Care Screening Complete Complete  Medication Review (RN CM) Complete Complete

## 2023-04-21 DIAGNOSIS — I10 Essential (primary) hypertension: Secondary | ICD-10-CM | POA: Diagnosis not present

## 2023-04-21 DIAGNOSIS — M6282 Rhabdomyolysis: Secondary | ICD-10-CM | POA: Insufficient documentation

## 2023-04-21 DIAGNOSIS — F1010 Alcohol abuse, uncomplicated: Secondary | ICD-10-CM

## 2023-04-21 DIAGNOSIS — L4050 Arthropathic psoriasis, unspecified: Secondary | ICD-10-CM

## 2023-04-21 DIAGNOSIS — F319 Bipolar disorder, unspecified: Secondary | ICD-10-CM | POA: Diagnosis not present

## 2023-04-21 DIAGNOSIS — L97529 Non-pressure chronic ulcer of other part of left foot with unspecified severity: Secondary | ICD-10-CM | POA: Insufficient documentation

## 2023-04-21 DIAGNOSIS — E871 Hypo-osmolality and hyponatremia: Secondary | ICD-10-CM | POA: Diagnosis not present

## 2023-04-21 LAB — SODIUM
Sodium: 123 mmol/L — ABNORMAL LOW (ref 135–145)
Sodium: 125 mmol/L — ABNORMAL LOW (ref 135–145)
Sodium: 126 mmol/L — ABNORMAL LOW (ref 135–145)
Sodium: 126 mmol/L — ABNORMAL LOW (ref 135–145)

## 2023-04-21 LAB — BASIC METABOLIC PANEL WITH GFR
Anion gap: 10 (ref 5–15)
BUN: 6 mg/dL (ref 6–20)
CO2: 24 mmol/L (ref 22–32)
Calcium: 8.8 mg/dL — ABNORMAL LOW (ref 8.9–10.3)
Chloride: 91 mmol/L — ABNORMAL LOW (ref 98–111)
Creatinine, Ser: 0.46 mg/dL — ABNORMAL LOW (ref 0.61–1.24)
GFR, Estimated: >60 mL/min (ref >60)
Glucose, Bld: 100 mg/dL — ABNORMAL HIGH (ref 70–99)
Potassium: 3.7 mmol/L (ref 3.5–5.1)
Sodium: 125 mmol/L — ABNORMAL LOW (ref 135–145)

## 2023-04-21 MED ORDER — OXYBUTYNIN CHLORIDE ER 10 MG PO TB24
10.0000 mg | ORAL_TABLET | Freq: Every day | ORAL | Status: DC
  Administered 2023-04-21 – 2023-04-22 (×2): 10 mg via ORAL
  Filled 2023-04-21 (×2): qty 1

## 2023-04-21 MED ORDER — TIZANIDINE HCL 4 MG PO TABS
4.0000 mg | ORAL_TABLET | Freq: Every day | ORAL | Status: DC | PRN
  Administered 2023-04-21 – 2023-04-22 (×2): 4 mg via ORAL
  Filled 2023-04-21 (×2): qty 1

## 2023-04-21 MED ORDER — ASPIRIN 81 MG PO TBEC
81.0000 mg | DELAYED_RELEASE_TABLET | Freq: Every day | ORAL | Status: DC
Start: 2023-04-21 — End: 2023-04-23
  Administered 2023-04-21 – 2023-04-22 (×2): 81 mg via ORAL
  Filled 2023-04-21 (×2): qty 1

## 2023-04-21 MED ORDER — SODIUM CHLORIDE 1 G PO TABS
2.0000 g | ORAL_TABLET | Freq: Three times a day (TID) | ORAL | Status: DC
  Administered 2023-04-21 (×3): 2 g via ORAL
  Filled 2023-04-21 (×3): qty 2

## 2023-04-21 NOTE — Assessment & Plan Note (Signed)
 Continuing dressing changes daily with Xeroform gauze to open wound beds, ABD pad and Kerlix wrap Treatment of concurrent cellulitis as noted above

## 2023-04-21 NOTE — Progress Notes (Signed)
 Mobility Specialist - Progress Note   04/21/23 1159  Mobility  Activity Ambulated with assistance in hallway  Level of Assistance Contact guard assist, steadying assist  Assistive Device Front wheel walker  Distance Ambulated (ft) 120 ft  Activity Response Tolerated well  Mobility Referral Yes  Mobility visit 1 Mobility  Mobility Specialist Start Time (ACUTE ONLY) 1141  Mobility Specialist Stop Time (ACUTE ONLY) 1154  Mobility Specialist Time Calculation (min) (ACUTE ONLY) 13 min   Pt received in bed and agreeable to mobility. Pt was minA from STS & CG during ambulation. No complaints during session. Pt to recliner after session with all needs met.   [facility]

## 2023-04-21 NOTE — Assessment & Plan Note (Signed)
 Alcohol abuse likely contributed to degree of patient's hyponatremia Counseled on cessation No evidence of withdrawal

## 2023-04-21 NOTE — Assessment & Plan Note (Signed)
 Outpatient Humira.

## 2023-04-21 NOTE — Assessment & Plan Note (Signed)
 Continue diltiazem As needed intravenous angiotensins for markedly elevated blood pressure

## 2023-04-21 NOTE — Assessment & Plan Note (Signed)
 Clinically improving Continuing Ancef Currently receiving day 6 of 7 Continue local wound care

## 2023-04-21 NOTE — Assessment & Plan Note (Signed)
 Sodium admission was 108, previous sodium was 133 from January 2025. Status post tolvaptan x 1 on 4/1 Nephrology following, their input is appreciated 1 L fluid restriction Continuing salt tablets Checking sodium every 6 hours per nephrology recommendations Etiology possibly secondary to alcohol abuse, SSRI use prior to discharge

## 2023-04-21 NOTE — Assessment & Plan Note (Signed)
 Extremely mild Creatine kinase downtrending

## 2023-04-21 NOTE — Assessment & Plan Note (Signed)
 Zyprexa, Lamictal, Celexa held due to concerns over hyponatremia

## 2023-04-21 NOTE — Progress Notes (Signed)
 PROGRESS NOTE   Billy Anderson  ZOX:[id number] DOB: 10/14/1965 DOA: 04/15/2023 PCP: Donell Beers, FNP   Date of Service: the patient was seen and examined on 04/21/2023  Brief Narrative:  58 yo male with past medical history of alcohol abuse, bipolar disorder, psoriatic arthritis, hypertension chronic hyponatremia presenting to [facility] emergency department on 3/27 with complaints of bilateral lower extremity swelling and pain and poor oral intake.  Upon evaluation in the emergency room patient was found to have an extremely low sodium of 108, down from 133 in January.  Patient was initially admitted to the [facility] service.    Patient was initially managed with intravenous normal saline bolus and sodium chloride tablets with rapid improvement in sodium to 116 over 6 hours.  Due to concerns for overcorrection patient was switched temporarily to D5 water.  Dr. Arlean Hopping with Nephrology was consulted.  Sodium level stabilized and correction slowed.  Patient was transferred to the hospitalist service on 4/1.  Nephrology recommended discontinuation of the patient's SSRI and a dose of tolvaptan administered on 4/1.  Continued monitoring of sodium levels with frequent blood testing was performed.   Assessment & Plan Hyponatremia Sodium admission was 108, previous sodium was 133 from January 2025. Status post tolvaptan x 1 on 4/1 Nephrology following, their input is appreciated 1 L fluid restriction Continuing salt tablets Checking sodium every 6 hours per nephrology recommendations Etiology possibly secondary to alcohol abuse, SSRI use prior to discharge Cellulitis of left lower extremity Clinically improving Continuing Ancef Currently receiving day 6 of 7 Continue local wound care Foot ulcer, left, with unspecified severity (HCC) Continuing dressing changes daily with Xeroform gauze to open wound beds, ABD pad and Kerlix wrap Treatment of concurrent cellulitis as noted  above Essential hypertension Continue diltiazem As needed intravenous angiotensins for markedly elevated blood pressure  Rhabdomyolysis Extremely mild Creatine kinase downtrending Bipolar disorder (HCC) Zyprexa, Lamictal, Celexa held due to concerns over hyponatremia  Psoriatic arthritis (HCC) Outpatient Humira Alcohol abuse Alcohol abuse likely contributed to degree of patient's hyponatremia Counseled on cessation No evidence of withdrawal   Subjective:  Patient reports improving strength and improved leg swelling.  Left lower extremity pain and redness have improved compared to earlier in the hospitalization.  Physical Exam:  Vitals:   04/20/23 0420 04/20/23 1123 04/20/23 2057 04/21/23 0341  BP: (!) 156/89 114/72 (!) 140/77 (!) 122/57  Pulse: (!) 55 (!) 57 (!) 56 (!) 50  Resp: 17  18 20   Temp: 97.7 F (36.5 C) 97.8 F (36.6 C) 97.7 F (36.5 C) 98.1 F (36.7 C)  TempSrc: Oral   Oral  SpO2: 98% 98% 96% 97%  Weight:      Height:        Constitutional: Awake alert and oriented x3, no associated distress.   Skin: Dressing of the left leg is clean dry and intact. Eyes: Pupils are equally reactive to light.  No evidence of scleral icterus or conjunctival pallor.  ENMT: Moist mucous membranes noted.  Posterior pharynx clear of any exudate or lesions.   Respiratory: clear to auscultation bilaterally, no wheezing, no crackles. Normal respiratory effort. No accessory muscle use.  Cardiovascular: Regular rate and rhythm, no murmurs / rubs / gallops. No extremity edema. 2+ pedal pulses. No carotid bruits.  Abdomen: Abdomen is soft and nontender.  No evidence of intra-abdominal masses.  Positive bowel sounds noted in all quadrants.   Musculoskeletal: No joint deformity upper and lower extremities. Good ROM, no contractures. Normal muscle  tone.    Data Reviewed:  I have personally reviewed and interpreted labs, imaging.  Significant findings are   CBC: Recent Labs  Lab  04/15/23 2105 04/16/23 0524  WBC 10.4 10.8*  NEUTROABS 8.8*  --   HGB 13.0 13.3  HCT 35.2* 36.1*  MCV 91.4 92.1  PLT 303 288   Basic Metabolic Panel: Recent Labs  Lab 04/16/23 0235 04/16/23 0524 04/16/23 0842 04/17/23 0308 04/17/23 1136 04/19/23 0246 04/19/23 1107 04/20/23 0554 04/20/23 1201 04/20/23 1739 04/20/23 2340 04/21/23 0540  NA 109* 109*   < > 118*   < > 120*   < > 119* 119* 125* 123* 125*  K 3.5 4.0  --  3.5  --  3.5  --   --   --   --   --  3.7  CL 76* 74*  --  84*  --  87*  --   --   --   --   --  91*  CO2 23 22  --  23  --  23  --   --   --   --   --  24  GLUCOSE 128* 93  --  99  --  109*  --   --   --   --   --  100*  BUN <5* <5*  --  <5*  --  <5*  --   --   --   --   --  6  CREATININE 0.41* 0.48*  --  0.35*  --  0.42*  --   --   --   --   --  0.46*  CALCIUM 7.6* 8.2*  --  8.1*  --  8.3*  --   --   --   --   --  8.8*  MG  --  1.8  --  1.7  --   --   --   --   --   --   --   --   PHOS  --   --   --  2.9  --   --   --   --   --   --   --   --    < > = values in this interval not displayed.   GFR: Estimated Creatinine Clearance: 110.5 mL/min (A) (by C-G formula based on SCr of 0.46 mg/dL (L)). Liver Function Tests: Recent Labs  Lab 04/15/23 2105 04/17/23 0308  AST 96* 52*  ALT 41 31  ALKPHOS 89 74  BILITOT 1.3* 0.4  PROT 6.6 6.0*  ALBUMIN 3.5 2.9*    Coagulation Profile: Recent Labs  Lab 04/15/23 2105 04/17/23 0308  INR 1.0 1.0     Code Status:  Full code.  Code status decision has been confirmed with: patient    Severity of Illness:  The appropriate patient status for this patient is INPATIENT. Inpatient status is judged to be reasonable and necessary in order to provide the required intensity of service to ensure the patient's safety. The patient's presenting symptoms, physical exam findings, and initial radiographic and laboratory data in the context of their chronic comorbidities is felt to place them at high risk for further clinical  deterioration. Furthermore, it is not anticipated that the patient will be medically stable for discharge from the hospital within 2 midnights of admission.   * I certify that at the point of admission it is my clinical judgment that the patient will require inpatient hospital care spanning beyond 2  midnights from the point of admission due to high intensity of service, high risk for further deterioration and high frequency of surveillance required.*  Time spent:  45 minutes  Author:  Marinda Elk MD  04/21/2023 8:54 AM

## 2023-04-21 NOTE — Progress Notes (Signed)
 [facility] Progress Note  Subjective:    Vitals:   04/20/23 1123 04/20/23 2057 04/21/23 0341 04/21/23 1315  BP: 114/72 (!) 140/77 (!) 122/57 117/65  Pulse: (!) 57 (!) 56 (!) 50 72  Resp:  18 20 16   Temp: 97.8 F (36.6 C) 97.7 F (36.5 C) 98.1 F (36.7 C) (!) 97.3 F (36.3 C)  TempSrc:   Oral Oral  SpO2: 98% 96% 97% 99%  Weight:      Height:        Exam: Gen alert, no distress Sclera anicteric, throat clear  No jvd or bruits Chest clear bilat to bases RRR no MRG Abd soft ntnd no mass or ascites +bs MS no joint effusions or deformity Ext no RLE edema, 1+ LLE pretib edema/ wrapped Neuro is alert, Ox 3 , nf        Renal-related home meds: Cardizem CD 180 bid Salt tabs 2 gm tid Others: PPI, zyprexa, naltrexone, lamictal, humira, gabapentin, celexa, asa   01/19/23 --> UOsm 406, UNa 90 04/15/23 --> UOsm 317, UNa 37 04/16/23  --> UOsm 311, UNa 62       Assessment/ Plan: Hyponatremia - Na+ 119-120 here on admission in patient w/ hx etoh abuse, seizures, depression. He is euvolemic on exam except for mild L lower leg swelling prob due to infection. No evidence of CHF or cirrhosis. Cortisol and TSH are wnl. Etoh abuse can be associated w/ low solute hyponatremia but the urine osm should be sig lower (< 100). SSRI could be the cause, it is on hold. Pt rec'd tolvaptan 15mg  x 1 on 4/01. Na+ up to 125 from 122. Have dc'd tolvaptan and will resume po salt tabs along w/ fluid restriction 1000 cc/d. Cont q 6 hr Na+ levels. Will follow.  H/o etoh abuse Bipolar d/o Seizure d/o - on lamictal     Rob Nathaly Dawkins MD  [facility] 04/21/2023, 2:35 PM  Recent Labs  Lab 04/15/23 2105 04/16/23 0235 04/16/23 0524 04/17/23 0308 04/19/23 0246 04/21/23 0540  HGB 13.0  --  13.3  --   --   --   ALBUMIN 3.5  --   --  2.9*  --   --   CALCIUM 8.5*   < > 8.2* 8.1* 8.3* 8.8*  PHOS  --   --   --  2.9  --   --   CREATININE 0.44*   < > 0.48* 0.35* 0.42* 0.46*  K 4.6   < > 4.0 3.5 3.5 3.7    < > = values in this interval not displayed.   No results for input(s): "IRON", "TIBC", "FERRITIN" in the last 168 hours. Inpatient medications:  Chlorhexidine Gluconate Cloth  6 each Topical Q2200   diltiazem  180 mg Oral Q12H   enoxaparin (LOVENOX) injection  40 mg Subcutaneous Q24H   folic acid  1 mg Oral Daily   multivitamin with minerals  1 tablet Oral Daily   pantoprazole  40 mg Oral BID   sodium chloride  2 g Oral TID WC   thiamine  100 mg Oral Daily   Or   thiamine  100 mg Intravenous Daily     ceFAZolin (ANCEF) IV 2 g (04/21/23 0609)   docusate sodium, hydrALAZINE, mouth rinse, polyethylene glycol

## 2023-04-21 NOTE — Plan of Care (Signed)

## 2023-04-21 NOTE — Hospital Course (Signed)
 58 yo male with past medical history of alcohol abuse, bipolar disorder, psoriatic arthritis, hypertension chronic hyponatremia presenting to [facility] emergency department on 3/27 with complaints of bilateral lower extremity swelling and pain and poor oral intake.  Upon evaluation in the emergency room patient was found to have an extremely low sodium of 108, down from 133 in January.  Patient was initially admitted to the [facility] service.    Patient was initially managed with intravenous normal saline bolus and sodium chloride tablets with rapid improvement in sodium to 116 over 6 hours.  Due to concerns for overcorrection patient was switched temporarily to D5 water.  Dr. Arlean Hopping with Nephrology was consulted.  Sodium level stabilized and correction slowed.  Patient was transferred to the hospitalist service on 4/1.  Nephrology recommended discontinuation of the patient's SSRI and a dose of tolvaptan administered on 4/1.  Continued monitoring of sodium levels with frequent blood testing was performed.

## 2023-04-22 DIAGNOSIS — F1010 Alcohol abuse, uncomplicated: Secondary | ICD-10-CM | POA: Diagnosis not present

## 2023-04-22 DIAGNOSIS — E871 Hypo-osmolality and hyponatremia: Secondary | ICD-10-CM | POA: Diagnosis not present

## 2023-04-22 DIAGNOSIS — I10 Essential (primary) hypertension: Secondary | ICD-10-CM | POA: Diagnosis not present

## 2023-04-22 DIAGNOSIS — F319 Bipolar disorder, unspecified: Secondary | ICD-10-CM | POA: Diagnosis not present

## 2023-04-22 LAB — COMPREHENSIVE METABOLIC PANEL WITH GFR
ALT: 14 U/L (ref 0–44)
AST: 31 U/L (ref 15–41)
Albumin: 3.1 g/dL — ABNORMAL LOW (ref 3.5–5.0)
Alkaline Phosphatase: 71 U/L (ref 38–126)
Anion gap: 12 (ref 5–15)
BUN: 6 mg/dL (ref 6–20)
CO2: 22 mmol/L (ref 22–32)
Calcium: 8.5 mg/dL — ABNORMAL LOW (ref 8.9–10.3)
Chloride: 92 mmol/L — ABNORMAL LOW (ref 98–111)
Creatinine, Ser: 0.43 mg/dL — ABNORMAL LOW (ref 0.61–1.24)
GFR, Estimated: >60 mL/min (ref >60)
Glucose, Bld: 87 mg/dL (ref 70–99)
Potassium: 3.5 mmol/L (ref 3.5–5.1)
Sodium: 126 mmol/L — ABNORMAL LOW (ref 135–145)
Total Bilirubin: 0.7 mg/dL (ref 0.0–1.2)
Total Protein: 6.7 g/dL (ref 6.5–8.1)

## 2023-04-22 LAB — CBC WITH DIFFERENTIAL/PLATELET
Abs Immature Granulocytes: 0.06 10*3/uL (ref 0.00–0.07)
Basophils Absolute: 0.1 10*3/uL (ref 0.0–0.1)
Basophils Relative: 1 %
Eosinophils Absolute: 0.5 10*3/uL (ref 0.0–0.5)
Eosinophils Relative: 7 %
HCT: 37.3 % — ABNORMAL LOW (ref 39.0–52.0)
Hemoglobin: 12.9 g/dL — ABNORMAL LOW (ref 13.0–17.0)
Immature Granulocytes: 1 %
Lymphocytes Relative: 22 %
Lymphs Abs: 1.8 10*3/uL (ref 0.7–4.0)
MCH: 34 pg (ref 26.0–34.0)
MCHC: 34.6 g/dL (ref 30.0–36.0)
MCV: 98.4 fL (ref 80.0–100.0)
Monocytes Absolute: 0.8 10*3/uL (ref 0.1–1.0)
Monocytes Relative: 10 %
Neutro Abs: 4.6 10*3/uL (ref 1.7–7.7)
Neutrophils Relative %: 59 %
Platelets: 346 10*3/uL (ref 150–400)
RBC: 3.79 MIL/uL — ABNORMAL LOW (ref 4.22–5.81)
RDW: 12.6 % (ref 11.5–15.5)
WBC: 7.9 10*3/uL (ref 4.0–10.5)
nRBC: 0 % (ref 0.0–0.2)

## 2023-04-22 LAB — MAGNESIUM: Magnesium: 1.6 mg/dL — ABNORMAL LOW (ref 1.7–2.4)

## 2023-04-22 LAB — SODIUM
Sodium: 125 mmol/L — ABNORMAL LOW (ref 135–145)
Sodium: 128 mmol/L — ABNORMAL LOW (ref 135–145)

## 2023-04-22 MED ORDER — MAGNESIUM SULFATE 4 GM/100ML IV SOLN
4.0000 g | Freq: Once | INTRAVENOUS | Status: AC
  Administered 2023-04-22: 4 g via INTRAVENOUS
  Filled 2023-04-22: qty 100

## 2023-04-22 MED ORDER — SODIUM CHLORIDE 1 G PO TABS
1.0000 g | ORAL_TABLET | Freq: Two times a day (BID) | ORAL | Status: DC
  Administered 2023-04-22 – 2023-04-23 (×3): 1 g via ORAL
  Filled 2023-04-22 (×3): qty 1

## 2023-04-22 MED ORDER — TOLVAPTAN 15 MG PO TABS
15.0000 mg | ORAL_TABLET | Freq: Once | ORAL | Status: AC
  Administered 2023-04-22: 15 mg via ORAL
  Filled 2023-04-22: qty 1

## 2023-04-22 NOTE — Progress Notes (Signed)
  [facility] Progress Note  Subjective:  Seen in room Na+ 126, not much improvement   Vitals:   04/21/23 0341 04/21/23 1315 04/21/23 2012 04/22/23 0519  BP: (!) 122/57 117/65 123/89 (!) 148/72  Pulse: (!) 50 72 73 (!) 56  Resp: 20 16 17 17   Temp: 98.1 F (36.7 C) (!) 97.3 F (36.3 C) 98.3 F (36.8 C) 97.8 F (36.6 C)  TempSrc: Oral Oral    SpO2: 97% 99% 100% 99%  Weight:      Height:        Exam: Gen alert, no distress Sclera anicteric, throat clear  No jvd or bruits Chest clear bilat to bases RRR no MRG Abd soft ntnd no mass or ascites +bs MS no joint effusions or deformity Ext no RLE edema, 1+ LLE pretib edema/ wrapped Neuro is alert, Ox 3 , nf        Renal-related home meds: Cardizem CD 180 bid Salt tabs 2 gm tid Others: PPI, zyprexa, naltrexone, lamictal, humira, gabapentin, celexa, asa   01/19/23 --> UOsm 406, UNa 90 04/15/23 --> UOsm 317, UNa 37 04/16/23  --> UOsm 311, UNa 62       Assessment/ Plan: Hyponatremia - Na+ 119-120 here on admission in patient w/ hx etoh abuse, seizures, depression. He is euvolemic on exam except for mild L lower leg swelling prob due to infection. No evidence of CHF or cirrhosis. Cortisol and TSH are wnl. Etoh abuse can be associated w/ low solute hyponatremia but the urine osm should be sig lower (< 100). SSRI could be the cause and has been on hold. S/P tolvaptan 15mg  x 1 on 4/01.  With salt tabs and fluid restriction 1000 cc/d, Na+ is not responding very well, up just 1 pt to 126 today. Will try another dose of tolvaptan 15mg . Will follow.  H/o etoh abuse Bipolar d/o Seizure d/o - on lamictal     Rob Arlean Hopping MD  [facility] 04/22/2023, 8:13 AM  Recent Labs  Lab 04/16/23 0524 04/17/23 0308 04/19/23 0246 04/21/23 0540 04/22/23 0548  HGB 13.3  --   --   --  12.9*  ALBUMIN  --  2.9*  --   --  3.1*  CALCIUM 8.2* 8.1*   < > 8.8* 8.5*  PHOS  --  2.9  --   --   --   CREATININE 0.48* 0.35*   < > 0.46* 0.43*  K 4.0  3.5   < > 3.7 3.5   < > = values in this interval not displayed.   No results for input(s): "IRON", "TIBC", "FERRITIN" in the last 168 hours. Inpatient medications:  aspirin EC  81 mg Oral QHS   Chlorhexidine Gluconate Cloth  6 each Topical Q2200   diltiazem  180 mg Oral Q12H   enoxaparin (LOVENOX) injection  40 mg Subcutaneous Q24H   folic acid  1 mg Oral Daily   multivitamin with minerals  1 tablet Oral Daily   oxybutynin  10 mg Oral QHS   pantoprazole  40 mg Oral BID   sodium chloride  1 g Oral BID WC   thiamine  100 mg Oral Daily   Or   thiamine  100 mg Intravenous Daily   tolvaptan  15 mg Oral Once     ceFAZolin (ANCEF) IV 2 g (04/22/23 0728)   docusate sodium, hydrALAZINE, mouth rinse, polyethylene glycol, tiZANidine

## 2023-04-22 NOTE — Assessment & Plan Note (Addendum)
 Extremely mild Creatine kinase downtrended

## 2023-04-22 NOTE — Assessment & Plan Note (Signed)
 Continue diltiazem As needed intravenous angiotensins for markedly elevated blood pressure

## 2023-04-22 NOTE — Assessment & Plan Note (Signed)
 Zyprexa, Lamictal, Celexa held due to concerns over hyponatremia

## 2023-04-22 NOTE — Assessment & Plan Note (Addendum)
 Sodium 126, still moderate target  sodium admission was 108, previous sodium was 133 from January 2025. Status post tolvaptan x 1 on 4/1, per nephrology, will repeat dose of tolvaptan today. 1200cc/day fluid restriction Continuing salt tablets Checking sodium every 6 hours per nephrology recommendations Etiology possibly secondary to alcohol abuse, SSRI use prior to discharge

## 2023-04-22 NOTE — Assessment & Plan Note (Signed)
 Outpatient Humira.

## 2023-04-22 NOTE — Assessment & Plan Note (Addendum)
 Clinically improving Continuing Ancef Currently receiving day 7 of 7 Continue local wound care

## 2023-04-22 NOTE — Progress Notes (Signed)
 PROGRESS NOTE   Billy Anderson  ZOX:[id number] DOB: [birth year] DOA: 04/15/2023 PCP: Donell Beers, FNP   Date of Service: the patient was seen and examined on 04/22/2023  Brief Narrative:  58 yo male with past medical history of alcohol abuse, bipolar disorder, psoriatic arthritis, hypertension chronic hyponatremia presenting to [facility] emergency department on 3/27 with complaints of bilateral lower extremity swelling and pain and poor oral intake.  Upon evaluation in the emergency room patient was found to have an extremely low sodium of 108, down from 133 in January.  Patient was initially admitted to the [facility] service.    Patient was initially managed with intravenous normal saline bolus and sodium chloride tablets with rapid improvement in sodium to 116 over 6 hours.  Due to concerns for overcorrection patient was switched temporarily to D5 water.  Dr. Arlean Hopping with Nephrology was consulted.  Sodium level stabilized and correction slowed.  Patient was transferred to the hospitalist service on 4/1.  Nephrology recommended discontinuation of the patient's SSRI and a dose of tolvaptan administered on 4/1.  Continued monitoring of sodium levels with frequent blood testing was performed.   Assessment & Plan Hyponatremia Sodium 126, still moderate target  sodium admission was 108, previous sodium was 133 from January 2025. Status post tolvaptan x 1 on 4/1, per nephrology, will repeat dose of tolvaptan today. 1200cc/day fluid restriction Continuing salt tablets Checking sodium every 6 hours per nephrology recommendations Etiology possibly secondary to alcohol abuse, SSRI use prior to discharge Cellulitis of left lower extremity Clinically improving Continuing Ancef Currently receiving day 7 of 7 Continue local wound care Foot ulcer, left, with unspecified severity (HCC) Continuing dressing changes daily with Xeroform gauze to open wound beds, ABD pad and Kerlix  wrap Treatment of concurrent cellulitis as noted above Essential hypertension Continue diltiazem As needed intravenous angiotensins for markedly elevated blood pressure  Rhabdomyolysis Extremely mild Creatine kinase downtrended Bipolar disorder (HCC) Zyprexa, Lamictal, Celexa held due to concerns over hyponatremia  Psoriatic arthritis (HCC) Outpatient Humira Alcohol abuse Alcohol abuse likely contributed to degree of patient's hyponatremia Counseled on cessation No evidence of withdrawal   Subjective:  Patient reports continued weakness and somewhat unsteady gait when attempting to walk.  Patient reports left lower extremity pain has resolved  Physical Exam:  Vitals:   04/21/23 0341 04/21/23 1315 04/21/23 2012 04/22/23 0519  BP: (!) 122/57 117/65 123/89 (!) 148/72  Pulse: (!) 50 72 73 (!) 56  Resp: 20 16 17 17   Temp: 98.1 F (36.7 C) (!) 97.3 F (36.3 C) 98.3 F (36.8 C) 97.8 F (36.6 C)  TempSrc: Oral Oral    SpO2: 97% 99% 100% 99%  Weight:      Height:        Constitutional: Awake alert and oriented x3, no associated distress.   Skin: Dressing of the left leg is clean dry and intact. Eyes: Pupils are equally reactive to light.  No evidence of scleral icterus or conjunctival pallor.  ENMT: Moist mucous membranes noted.  Posterior pharynx clear of any exudate or lesions.   Respiratory: clear to auscultation bilaterally, no wheezing, no crackles. Normal respiratory effort. No accessory muscle use.  Cardiovascular: Regular rate and rhythm, no murmurs / rubs / gallops. No extremity edema. 2+ pedal pulses. No carotid bruits.  Abdomen: Abdomen is soft and nontender.  No evidence of intra-abdominal masses.  Positive bowel sounds noted in all quadrants.   Musculoskeletal: Some tenderness noted of the left lower extremity on palpation.  No joint deformity upper and lower extremities. Good ROM, no contractures. Normal muscle tone.    Data Reviewed:  I have personally  reviewed and interpreted labs, imaging.  Significant findings are   CBC: Recent Labs  Lab 04/15/23 2105 04/16/23 0524 04/22/23 0548  WBC 10.4 10.8* 7.9  NEUTROABS 8.8*  --  4.6  HGB 13.0 13.3 12.9*  HCT 35.2* 36.1* 37.3*  MCV 91.4 92.1 98.4  PLT 303 288 346   Basic Metabolic Panel: Recent Labs  Lab 04/16/23 0524 04/16/23 0842 04/17/23 0308 04/17/23 1136 04/19/23 0246 04/19/23 1107 04/21/23 0540 04/21/23 1133 04/21/23 1648 04/21/23 2306 04/22/23 0548  NA 109*   < > 118*   < > 120*   < > 125* 125* 126* 126* 126*  K 4.0  --  3.5  --  3.5  --  3.7  --   --   --  3.5  CL 74*  --  84*  --  87*  --  91*  --   --   --  92*  CO2 22  --  23  --  23  --  24  --   --   --  22  GLUCOSE 93  --  99  --  109*  --  100*  --   --   --  87  BUN <5*  --  <5*  --  <5*  --  6  --   --   --  6  CREATININE 0.48*  --  0.35*  --  0.42*  --  0.46*  --   --   --  0.43*  CALCIUM 8.2*  --  8.1*  --  8.3*  --  8.8*  --   --   --  8.5*  MG 1.8  --  1.7  --   --   --   --   --   --   --  1.6*  PHOS  --   --  2.9  --   --   --   --   --   --   --   --    < > = values in this interval not displayed.   GFR: Estimated Creatinine Clearance: 110.5 mL/min (A) (by C-G formula based on SCr of 0.43 mg/dL (L)). Liver Function Tests: Recent Labs  Lab 04/15/23 2105 04/17/23 0308 04/22/23 0548  AST 96* 52* 31  ALT 41 31 14  ALKPHOS 89 74 71  BILITOT 1.3* 0.4 0.7  PROT 6.6 6.0* 6.7  ALBUMIN 3.5 2.9* 3.1*    Coagulation Profile: Recent Labs  Lab 04/15/23 2105 04/17/23 0308  INR 1.0 1.0     Code Status:  Full code.  Code status decision has been confirmed with: patient    Severity of Illness:  The appropriate patient status for this patient is INPATIENT. Inpatient status is judged to be reasonable and necessary in order to provide the required intensity of service to ensure the patient's safety. The patient's presenting symptoms, physical exam findings, and initial radiographic and laboratory  data in the context of their chronic comorbidities is felt to place them at high risk for further clinical deterioration. Furthermore, it is not anticipated that the patient will be medically stable for discharge from the hospital within 2 midnights of admission.   * I certify that at the point of admission it is my clinical judgment that the patient will require inpatient hospital care spanning beyond 2 midnights from the  point of admission due to high intensity of service, high risk for further deterioration and high frequency of surveillance required.*  Time spent:  40 minutes  Author:  Marinda Elk MD  04/22/2023 8:45 AM

## 2023-04-22 NOTE — Plan of Care (Signed)
  Problem: Education: Goal: Knowledge of General Education information will improve Description: Including pain rating scale, medication(s)/side effects and non-pharmacologic comfort measures Outcome: Progressing   Problem: Health Behavior/Discharge Planning: Goal: Ability to manage health-related needs will improve Outcome: Progressing   Problem: Clinical Measurements: Goal: Ability to maintain clinical measurements within normal limits will improve Outcome: Progressing Goal: Cardiovascular complication will be avoided Outcome: Progressing   Problem: Nutrition: Goal: Adequate nutrition will be maintained Outcome: Progressing   Problem: Elimination: Goal: Will not experience complications related to urinary retention Outcome: Progressing   Problem: Safety: Goal: Ability to remain free from injury will improve Outcome: Progressing   Problem: Skin Integrity: Goal: Risk for impaired skin integrity will decrease Outcome: Progressing   Problem: Skin Integrity: Goal: Skin integrity will improve Outcome: Progressing

## 2023-04-22 NOTE — Progress Notes (Signed)
 Physical Therapy Treatment Patient Details Name: Billy Anderson MRN: [id number] DOB: 18-Mar-1965 Today's Date: 04/22/2023   History of Present Illness Pt admitted from home with LE edema/cellulitis, Rhabdomyolysis, hyponatremia, hypocalcemia and inability to stand 2* bil foot pain with WB. PMH: hyponatremia, seizures, bipolar, dysrythmia, Pneumonia, ACDF    PT Comments  Pt agreeable to mobilize and ambulate improved distance of 280 ft today with RW.  Current d/c plan for home with HHPT and family support remains appropriate.    If plan is discharge home, recommend the following: A little help with walking and/or transfers;A little help with bathing/dressing/bathroom;Assistance with cooking/housework;Assist for transportation;Help with stairs or ramp for entrance   Can travel by private vehicle        Equipment Recommendations  None recommended by PT    Recommendations for Other Services       Precautions / Restrictions Precautions Precautions: Fall Precaution/Restrictions Comments: hx seizures     Mobility  Bed Mobility Overal bed mobility: Needs Assistance Bed Mobility: Supine to Sit     Supine to sit: Supervision     General bed mobility comments: therapist managing lines for safety    Transfers Overall transfer level: Needs assistance Equipment used: Rolling walker (2 wheels) Transfers: Sit to/from Stand Sit to Stand: Contact guard assist           General transfer comment: verbal cues for hand placement    Ambulation/Gait Ambulation/Gait assistance: Contact guard assist Gait Distance (Feet): 280 Feet Assistive device: Rolling walker (2 wheels) Gait Pattern/deviations: Step-through pattern, Decreased stride length, Trunk flexed Gait velocity: decreased     General Gait Details: verbal cue for posture (tends to keep trunk forward flexed); also observed bil toe curls during weight bearing; no overt LOB   Stairs             Wheelchair Mobility      Tilt Bed    Modified Rankin (Stroke Patients Only)       Balance Overall balance assessment: Needs assistance         Standing balance support: Bilateral upper extremity supported, Reliant on assistive device for balance, During functional activity Standing balance-Leahy Scale: Poor                              Communication Communication Communication: Impaired Factors Affecting Communication: Hearing impaired  Cognition Arousal: Alert Behavior During Therapy: Flat affect   PT - Cognitive impairments: No family/caregiver present to determine baseline                       PT - Cognition Comments: pt flat, responds to questions with increased time possiblly ?HOH Following commands: Intact      Cueing Cueing Techniques: Verbal cues  Exercises      General Comments        Pertinent Vitals/Pain Pain Assessment Pain Assessment: No/denies pain Pain Intervention(s): Repositioned, Monitored during session    Home Living                          Prior Function            PT Goals (current goals can now be found in the care plan section) Progress towards PT goals: Progressing toward goals    Frequency    Min 2X/week      PT Plan      Co-evaluation  AM-PAC PT "6 Clicks" Mobility   Outcome Measure  Help needed turning from your back to your side while in a flat bed without using bedrails?: None Help needed moving from lying on your back to sitting on the side of a flat bed without using bedrails?: None Help needed moving to and from a bed to a chair (including a wheelchair)?: A Little Help needed standing up from a chair using your arms (e.g., wheelchair or bedside chair)?: A Little Help needed to walk in hospital room?: A Little Help needed climbing 3-5 steps with a railing? : A Little 6 Click Score: 20    End of Session Equipment Utilized During Treatment: Gait belt Activity Tolerance: Patient  tolerated treatment well Patient left: in chair;with call bell/phone within reach;with chair alarm set Nurse Communication: Mobility status PT Visit Diagnosis: Difficulty in walking, not elsewhere classified (R26.2)     Time: 0960-4540 PT Time Calculation (min) (ACUTE ONLY): 15 min  Charges:    $Gait Training: 8-22 mins PT General Charges $$ ACUTE PT VISIT: 1 Visit                     Paulino Door, DPT Physical Therapist Acute Rehabilitation Services Office: [phone]    Billy Anderson 04/22/2023, 12:14 PM

## 2023-04-22 NOTE — Assessment & Plan Note (Signed)
 Alcohol abuse likely contributed to degree of patient's hyponatremia Counseled on cessation No evidence of withdrawal

## 2023-04-22 NOTE — Assessment & Plan Note (Signed)
 Continuing dressing changes daily with Xeroform gauze to open wound beds, ABD pad and Kerlix wrap Treatment of concurrent cellulitis as noted above

## 2023-04-23 ENCOUNTER — Other Ambulatory Visit (HOSPITAL_COMMUNITY): Payer: Self-pay

## 2023-04-23 ENCOUNTER — Observation Stay (HOSPITAL_COMMUNITY)
Admission: EM | Admit: 2023-04-23 | Discharge: 2023-04-24 | Disposition: A | Discharge status: Home / Self Care | Attending: Family Medicine | Admitting: Family Medicine

## 2023-04-23 ENCOUNTER — Other Ambulatory Visit: Payer: Self-pay

## 2023-04-23 ENCOUNTER — Emergency Department (HOSPITAL_COMMUNITY)

## 2023-04-23 DIAGNOSIS — I10 Essential (primary) hypertension: Secondary | ICD-10-CM | POA: Diagnosis present

## 2023-04-23 DIAGNOSIS — F1021 Alcohol dependence, in remission: Secondary | ICD-10-CM | POA: Diagnosis not present

## 2023-04-23 DIAGNOSIS — I480 Paroxysmal atrial fibrillation: Secondary | ICD-10-CM | POA: Diagnosis not present

## 2023-04-23 DIAGNOSIS — G40909 Epilepsy, unspecified, not intractable, without status epilepticus: Principal | ICD-10-CM | POA: Insufficient documentation

## 2023-04-23 DIAGNOSIS — R519 Headache, unspecified: Secondary | ICD-10-CM | POA: Diagnosis not present

## 2023-04-23 DIAGNOSIS — R569 Unspecified convulsions: Secondary | ICD-10-CM | POA: Diagnosis not present

## 2023-04-23 DIAGNOSIS — F3110 Bipolar disorder, current episode manic without psychotic features, unspecified: Secondary | ICD-10-CM | POA: Diagnosis present

## 2023-04-23 DIAGNOSIS — L03116 Cellulitis of left lower limb: Secondary | ICD-10-CM | POA: Diagnosis not present

## 2023-04-23 DIAGNOSIS — F17210 Nicotine dependence, cigarettes, uncomplicated: Secondary | ICD-10-CM | POA: Insufficient documentation

## 2023-04-23 DIAGNOSIS — R17 Unspecified jaundice: Secondary | ICD-10-CM | POA: Insufficient documentation

## 2023-04-23 DIAGNOSIS — R000 Tachycardia, unspecified: Secondary | ICD-10-CM | POA: Diagnosis not present

## 2023-04-23 DIAGNOSIS — Z7982 Long term (current) use of aspirin: Secondary | ICD-10-CM | POA: Insufficient documentation

## 2023-04-23 DIAGNOSIS — E871 Hypo-osmolality and hyponatremia: Secondary | ICD-10-CM | POA: Diagnosis not present

## 2023-04-23 DIAGNOSIS — Z79899 Other long term (current) drug therapy: Secondary | ICD-10-CM | POA: Insufficient documentation

## 2023-04-23 DIAGNOSIS — F312 Bipolar disorder, current episode manic severe with psychotic features: Secondary | ICD-10-CM | POA: Diagnosis not present

## 2023-04-23 DIAGNOSIS — F17200 Nicotine dependence, unspecified, uncomplicated: Secondary | ICD-10-CM | POA: Diagnosis present

## 2023-04-23 DIAGNOSIS — L4052 Psoriatic arthritis mutilans: Secondary | ICD-10-CM | POA: Insufficient documentation

## 2023-04-23 DIAGNOSIS — L4050 Arthropathic psoriasis, unspecified: Secondary | ICD-10-CM | POA: Diagnosis present

## 2023-04-23 LAB — CBC WITH DIFFERENTIAL/PLATELET
Abs Immature Granulocytes: 0.04 10*3/uL (ref 0.00–0.07)
Basophils Absolute: 0.1 10*3/uL (ref 0.0–0.1)
Basophils Relative: 1 %
Eosinophils Absolute: 0.3 10*3/uL (ref 0.0–0.5)
Eosinophils Relative: 3 %
HCT: 39.7 % (ref 39.0–52.0)
Hemoglobin: 14.2 g/dL (ref 13.0–17.0)
Immature Granulocytes: 0 %
Lymphocytes Relative: 25 %
Lymphs Abs: 2.5 10*3/uL (ref 0.7–4.0)
MCH: 33.8 pg (ref 26.0–34.0)
MCHC: 35.8 g/dL (ref 30.0–36.0)
MCV: 94.5 fL (ref 80.0–100.0)
Monocytes Absolute: 0.7 10*3/uL (ref 0.1–1.0)
Monocytes Relative: 7 %
Neutro Abs: 6.3 10*3/uL (ref 1.7–7.7)
Neutrophils Relative %: 64 %
Platelets: 446 10*3/uL — ABNORMAL HIGH (ref 150–400)
RBC: 4.2 MIL/uL — ABNORMAL LOW (ref 4.22–5.81)
RDW: 12.5 % (ref 11.5–15.5)
WBC: 10 10*3/uL (ref 4.0–10.5)
nRBC: 0 % (ref 0.0–0.2)

## 2023-04-23 LAB — LIPASE, BLOOD: Lipase: 24 U/L (ref 11–51)

## 2023-04-23 LAB — COMPREHENSIVE METABOLIC PANEL WITH GFR
ALT: 15 U/L (ref 0–44)
AST: 33 U/L (ref 15–41)
Albumin: 3.8 g/dL (ref 3.5–5.0)
Alkaline Phosphatase: 84 U/L (ref 38–126)
Anion gap: 17 — ABNORMAL HIGH (ref 5–15)
BUN: 7 mg/dL (ref 6–20)
CO2: 18 mmol/L — ABNORMAL LOW (ref 22–32)
Calcium: 9.1 mg/dL (ref 8.9–10.3)
Chloride: 94 mmol/L — ABNORMAL LOW (ref 98–111)
Creatinine, Ser: 0.6 mg/dL — ABNORMAL LOW (ref 0.61–1.24)
GFR, Estimated: >60 mL/min (ref >60)
Glucose, Bld: 133 mg/dL — ABNORMAL HIGH (ref 70–99)
Potassium: 3.5 mmol/L (ref 3.5–5.1)
Sodium: 129 mmol/L — ABNORMAL LOW (ref 135–145)
Total Bilirubin: 0.7 mg/dL (ref 0.0–1.2)
Total Protein: 7.2 g/dL (ref 6.5–8.1)

## 2023-04-23 LAB — BASIC METABOLIC PANEL WITH GFR
Anion gap: 11 (ref 5–15)
BUN: <5 mg/dL — ABNORMAL LOW (ref 6–20)
CO2: 22 mmol/L (ref 22–32)
Calcium: 8.6 mg/dL — ABNORMAL LOW (ref 8.9–10.3)
Chloride: 96 mmol/L — ABNORMAL LOW (ref 98–111)
Creatinine, Ser: 0.49 mg/dL — ABNORMAL LOW (ref 0.61–1.24)
GFR, Estimated: >60 mL/min (ref >60)
Glucose, Bld: 82 mg/dL (ref 70–99)
Potassium: 3.6 mmol/L (ref 3.5–5.1)
Sodium: 129 mmol/L — ABNORMAL LOW (ref 135–145)

## 2023-04-23 LAB — TROPONIN I (HIGH SENSITIVITY): Troponin I (High Sensitivity): 4 ng/L (ref <18)

## 2023-04-23 LAB — CBG MONITORING, ED: Glucose-Capillary: 148 mg/dL — ABNORMAL HIGH (ref 70–99)

## 2023-04-23 MED ORDER — OLANZAPINE 20 MG PO TABS
20.0000 mg | ORAL_TABLET | Freq: Every day | ORAL | 0 refills | Status: DC
  Filled 2023-04-23: qty 30, 30d supply, fill #0

## 2023-04-23 MED ORDER — VITAMIN B-1 100 MG PO TABS
100.0000 mg | ORAL_TABLET | Freq: Every day | ORAL | 0 refills | Status: AC
  Filled 2023-04-23: qty 30, 30d supply, fill #0

## 2023-04-23 MED ORDER — SODIUM CHLORIDE 1 G PO TABS
2.0000 g | ORAL_TABLET | Freq: Two times a day (BID) | ORAL | Status: DC
  Administered 2023-04-23 – 2023-04-24 (×3): 2 g via ORAL
  Filled 2023-04-23 (×4): qty 2

## 2023-04-23 MED ORDER — ONDANSETRON HCL 4 MG/2ML IJ SOLN: 4.0000 mg | Freq: Four times a day (QID) | INTRAMUSCULAR | Status: DC | PRN

## 2023-04-23 MED ORDER — LACTATED RINGERS IV BOLUS
1000.0000 mL | Freq: Once | INTRAVENOUS | Status: AC
  Administered 2023-04-23: 1000 mL via INTRAVENOUS

## 2023-04-23 MED ORDER — FOLIC ACID 1 MG PO TABS
1.0000 mg | ORAL_TABLET | Freq: Every day | ORAL | Status: DC
  Administered 2023-04-24: 1 mg via ORAL
  Filled 2023-04-23: qty 1

## 2023-04-23 MED ORDER — DILTIAZEM HCL ER COATED BEADS 240 MG PO CP24
360.0000 mg | ORAL_CAPSULE | Freq: Every day | ORAL | Status: DC
  Administered 2023-04-24: 360 mg via ORAL
  Filled 2023-04-23: qty 1

## 2023-04-23 MED ORDER — ACETAMINOPHEN 325 MG PO TABS: 650.0000 mg | ORAL_TABLET | Freq: Four times a day (QID) | ORAL | Status: DC | PRN

## 2023-04-23 MED ORDER — LAMOTRIGINE 100 MG PO TABS
200.0000 mg | ORAL_TABLET | Freq: Every day | ORAL | Status: DC
Start: 2023-04-23 — End: 2023-04-24
  Administered 2023-04-23: 200 mg via ORAL
  Filled 2023-04-23: qty 2

## 2023-04-23 MED ORDER — OLANZAPINE 10 MG PO TABS
20.0000 mg | ORAL_TABLET | Freq: Every day | ORAL | Status: DC
  Administered 2023-04-23: 20 mg via ORAL
  Filled 2023-04-23 (×2): qty 2

## 2023-04-23 MED ORDER — LAMOTRIGINE 100 MG PO TABS: 200.0000 mg | ORAL_TABLET | Freq: Every day | ORAL | Status: DC

## 2023-04-23 MED ORDER — ONDANSETRON HCL 4 MG PO TABS: 4.0000 mg | ORAL_TABLET | Freq: Four times a day (QID) | ORAL | Status: DC | PRN

## 2023-04-23 MED ORDER — LAMOTRIGINE 200 MG PO TABS
200.0000 mg | ORAL_TABLET | Freq: Every day | ORAL | 0 refills | Status: DC
  Filled 2023-04-23: qty 30, 30d supply, fill #0

## 2023-04-23 MED ORDER — SODIUM CHLORIDE 1 G PO TABS
2.0000 g | ORAL_TABLET | Freq: Two times a day (BID) | ORAL | 0 refills | Status: AC
Start: 2023-04-23 — End: ?
  Filled 2023-04-23: qty 28, 7d supply, fill #0

## 2023-04-23 MED ORDER — FOLIC ACID 1 MG PO TABS
1.0000 mg | ORAL_TABLET | Freq: Every day | ORAL | 0 refills | Status: AC
  Filled 2023-04-23: qty 30, 30d supply, fill #0

## 2023-04-23 MED ORDER — OLANZAPINE 10 MG PO TABS
20.0000 mg | ORAL_TABLET | Freq: Every day | ORAL | Status: DC
  Filled 2023-04-23: qty 2

## 2023-04-23 MED ORDER — PANTOPRAZOLE SODIUM 40 MG PO TBEC
40.0000 mg | DELAYED_RELEASE_TABLET | Freq: Every day | ORAL | Status: DC
  Administered 2023-04-24: 40 mg via ORAL
  Filled 2023-04-23: qty 1

## 2023-04-23 MED ORDER — ENOXAPARIN SODIUM 40 MG/0.4ML IJ SOSY
40.0000 mg | PREFILLED_SYRINGE | INTRAMUSCULAR | Status: DC
  Administered 2023-04-23: 40 mg via SUBCUTANEOUS
  Filled 2023-04-23: qty 0.4

## 2023-04-23 MED ORDER — ASPIRIN 81 MG PO TBEC
81.0000 mg | DELAYED_RELEASE_TABLET | Freq: Every day | ORAL | Status: DC
Start: 2023-04-23 — End: 2023-04-24
  Administered 2023-04-23 – 2023-04-24 (×2): 81 mg via ORAL
  Filled 2023-04-23: qty 1

## 2023-04-23 MED ORDER — SODIUM CHLORIDE 1 G PO TABS
2.0000 g | ORAL_TABLET | Freq: Two times a day (BID) | ORAL | Status: DC
  Filled 2023-04-23: qty 2

## 2023-04-23 MED ORDER — OXYBUTYNIN CHLORIDE ER 10 MG PO TB24
10.0000 mg | ORAL_TABLET | Freq: Every day | ORAL | Status: DC
  Administered 2023-04-23: 10 mg via ORAL
  Filled 2023-04-23: qty 1

## 2023-04-23 MED ORDER — THIAMINE MONONITRATE 100 MG PO TABS
100.0000 mg | ORAL_TABLET | Freq: Every day | ORAL | Status: DC
  Administered 2023-04-24: 100 mg via ORAL
  Filled 2023-04-23: qty 1

## 2023-04-23 MED ORDER — SENNOSIDES-DOCUSATE SODIUM 8.6-50 MG PO TABS: 1.0000 | ORAL_TABLET | Freq: Every evening | ORAL | Status: DC | PRN

## 2023-04-23 MED ORDER — SODIUM CHLORIDE 1 G PO TABS
2.0000 g | ORAL_TABLET | Freq: Two times a day (BID) | ORAL | Status: DC
  Administered 2023-04-23: 2 g via ORAL
  Filled 2023-04-23: qty 2

## 2023-04-23 MED ORDER — ACETAMINOPHEN 650 MG RE SUPP: 650.0000 mg | Freq: Four times a day (QID) | RECTAL | Status: DC | PRN

## 2023-04-23 NOTE — Discharge Summary (Addendum)
 Physician Discharge Summary   Patient: Billy Anderson MRN: [id number] DOB: 21-Jun-1965  Admit date:     04/15/2023  Discharge date: 04/23/23  Discharge Physician: Marinda Elk   PCP: Donell Beers, FNP   Recommendations at discharge:   Please take all prescribed medications exactly as instructed as detailed in your medication list. Please consume regular diet.  Please restrict water intake to no more than 1.2 to 1.5 liters daily.  Please abstain from alcohol use. Please increase your physical activity as tolerated. Please maintain all outpatient follow-up appointments including follow-up with your primary care provider and mental health provider. Please inquire with your primary care provider about getting repeat blood work including a repeat chemistry in one week to check your sodium levels.  Please return to the emergency department if you develop worsening weakness or inability to tolerate oral intake.  Discharge Diagnoses: Principal Problem:   Hyponatremia Active Problems:   Essential hypertension   Psoriatic arthritis (HCC)   Alcohol abuse   Cellulitis of left lower extremity   Foot ulcer, left, with unspecified severity (HCC)   Rhabdomyolysis   Bipolar disorder (HCC)  Resolved Problems:   * No resolved hospital problems. *   Hospital Course: 59 yo male with past medical history of alcohol abuse, bipolar disorder, psoriatic arthritis, hypertension chronic hyponatremia presenting to [facility] emergency department on 3/27 with complaints of bilateral lower extremity swelling and pain and poor oral intake.  Upon evaluation in the emergency room patient was found to have an extremely low sodium of 108, down from 133 in January.  Patient was initially admitted to the [facility] service.    Patient was initially managed with intravenous normal saline bolus and sodium chloride tablets with rapid improvement in sodium to 116 over 6 hours.  Due to concerns for  overcorrection patient was switched temporarily to D5 water.  Dr. Arlean Hopping with Nephrology was consulted who assisted with hyponatremia management.    Concerning the etiology of the patient's hyponatremia, this was felt to most likely be secondary to the patient's longstanding heavy alcohol use.  Nephrology felt that with patient's SSRI use with Celexa could also be contributing and therefore this was also discontinued.  Lamotrigine and olanzapine were also temporarily held however these were resumed prior to discharge.  In the days that followed, the sodium level slowly improved.    Patient was transferred to the hospitalist service on 4/1.  Patient received a dose of tolvaptan on 4/1 and a repeat dose of tolvaptan on 4/3.  As patient sodium levels improved patient's associated generalized weakness resolved.  Concerning the patient's left lower extremity cellulitis and left lower extremity wounds.  Wound care team was consulted and recommended daily dressing changes which were continued throughout the hospitalization.  Patient was additionally treated with a course of 7 days of intravenous antibiotics with cefazolin which was completed during this hospitalization.  Patient continued to clinically improve with sodium improving to 129 on 4/4.  Patient was discharged home with home health services for physical therapy and wound care on 4/4 in improved and stable condition.   Consultants: Dr. Arlean Hopping with Nephrology Procedures performed: None  Disposition: Home health Diet recommendation:  Discharge Diet Orders (From admission, onward)     Start     Ordered   04/23/23 0000  Diet general        04/23/23 1429           Regular diet  DISCHARGE MEDICATION: Allergies as of 04/23/2023  Reactions   Ativan [lorazepam] Other (See Comments)   Confusion  Hallucinations    Roxicodone [oxycodone] Itching   Ultram [tramadol] Other (See Comments)   Seizures    Vistaril [hydroxyzine] Itching,  Other (See Comments)   Sedation Not effective in reducing anxiety        Medication List     STOP taking these medications    citalopram 10 MG tablet Commonly known as: CELEXA   gabapentin 100 MG capsule Commonly known as: Neurontin       TAKE these medications    ALLEGRA PO Take 180 mg by mouth daily.   Aspirin Low Dose 81 MG tablet Generic drug: aspirin EC Take 1 tablet (81 mg total) by mouth daily. What changed: when to take this   CALCIUM 600+D3 PO Take 1 tablet by mouth 2 (two) times daily.   diltiazem 180 MG 24 hr capsule Commonly known as: CARDIZEM CD TAKE 2 CAPSULES BY MOUTH DAILY What changed:  how much to take when to take this   folic acid 1 MG tablet Commonly known as: FOLVITE Take 1 tablet (1 mg total) by mouth daily. What changed: when to take this   lamoTRIgine 200 MG tablet Commonly known as: LAMICTAL Take 1 tablet (200 mg total) by mouth at bedtime.   OLANZapine 20 MG tablet Commonly known as: ZYPREXA Take 1 tablet (20 mg total) by mouth at bedtime.   oxybutynin 10 MG 24 hr tablet Commonly known as: DITROPAN-XL TAKE 1 TABLET BY MOUTH AT BEDTIME   pantoprazole 40 MG tablet Commonly known as: PROTONIX Take 1 tablet (40 mg total) by mouth daily.   sodium chloride 1 g tablet Take 2 tablets (2 g total) by mouth 2 (two) times daily with a meal.   thiamine 100 MG tablet Commonly known as: VITAMIN B1 Take 1 tablet (100 mg total) by mouth daily. Start taking on: April 24, 2023   tiZANidine 4 MG tablet Commonly known as: ZANAFLEX Take 4 mg by mouth daily as needed for muscle spasms.               Discharge Care Instructions  (From admission, onward)           Start     Ordered   04/23/23 0000  Discharge wound care:       Comments: Cleanse L lower leg/dorsal foot (intact skin and open) with Vashe wound cleanser,do not rinse and allow to air dry.  Apply Xeroform gauze to open wound beds, cover with ABD pad and wrap with  Kerlix roll gauze beginning right above toes and ending right below knee.  May apply Ace bandage wrapped in same fashion as Kerlix for light compression.   04/23/23 1429            Follow-up Information     Paseda, Baird Kay, FNP. Schedule an appointment as soon as possible for a visit in 1 week(s).   Specialty: Nurse Practitioner Contact information: [address] [place] [address] [phone]         Care, [facility] Follow up.   Specialty: Home Health Services Why: Frances Furbish will follow up with you at discharge to provide home health services Contact information: [address] [place] [address] [phone]                 Discharge Exam: Filed Weights   04/17/23 0500 04/18/23 0327 04/23/23 0500  Weight: 87.6 kg 86.3 kg 77.6 kg  Constitutional: Awake alert and oriented x3, no associated distress.   Respiratory: clear to auscultation bilaterally, no wheezing, no crackles. Normal respiratory effort. No accessory muscle use.  Cardiovascular: Regular rate and rhythm, no murmurs / rubs / gallops. No extremity edema. 2+ pedal pulses. No carotid bruits.  Abdomen: Abdomen is soft and nontender.  No evidence of intra-abdominal masses.  Positive bowel sounds noted in all quadrants.   Musculoskeletal: Left lower extremity dressing is clean dry and intact.  No joint deformity upper and lower extremities. Good ROM, no contractures. Normal muscle tone.     Condition at discharge: fair  The results of significant diagnostics from this hospitalization (including imaging, microbiology, ancillary and laboratory) are listed below for reference.   Imaging Studies: ECHOCARDIOGRAM COMPLETE Result Date: 04/17/2023    ECHOCARDIOGRAM REPORT   Patient Name:   Billy Anderson Date of Exam: 04/17/2023 Medical Rec #:  [id number]        Height:       72.0 in Accession #:    [id number]       Weight:       193.1 lb Date of Birth:  02-17-1965         BSA:          2.099 m Patient Age:    57 years         BP:           165/67 mmHg Patient Gender: M                HR:           66 bpm. Exam Location:  Inpatient Procedure: 2D Echo, Cardiac Doppler and Color Doppler (Both Spectral and Color            Flow Doppler were utilized during procedure). Indications:    Cardiomyopathy  History:        Patient has no prior history of Echocardiogram examinations.                 Arrythmias:Atrial Fibrillation; Risk Factors:ETOH and                 Hypertension.  Sonographer:    Amy Chionchio Referring Phys: [id number] MURALI RAMASWAMY IMPRESSIONS  1. Left ventricular ejection fraction, by estimation, is 60 to 65%. The left ventricle has normal function. The left ventricle has no regional wall motion abnormalities. Left ventricular diastolic parameters were normal.  2. Right ventricular systolic function is normal. The right ventricular size is normal. There is normal pulmonary artery systolic pressure. The estimated right ventricular systolic pressure is 29.4 mmHg.  3. Left atrial size was severely dilated.  4. The mitral valve is normal in structure. No evidence of mitral valve regurgitation. No evidence of mitral stenosis.  5. The aortic valve is tricuspid. Aortic valve regurgitation is not visualized. Aortic valve sclerosis is present, with no evidence of aortic valve stenosis.  6. The inferior vena cava is normal in size with greater than 50% respiratory variability, suggesting right atrial pressure of 3 mmHg. FINDINGS  Left Ventricle: Left ventricular ejection fraction, by estimation, is 60 to 65%. The left ventricle has normal function. The left ventricle has no regional wall motion abnormalities. The left ventricular internal cavity size was normal in size. There is  no left ventricular hypertrophy. Left ventricular diastolic parameters were normal. Right Ventricle: The right ventricular size is normal. No increase in right ventricular wall thickness. Right ventricular  systolic function is normal. There is normal pulmonary  artery systolic pressure. The tricuspid regurgitant velocity is 2.57 m/s, and  with an assumed right atrial pressure of 3 mmHg, the estimated right ventricular systolic pressure is 29.4 mmHg. Left Atrium: Left atrial size was severely dilated. Right Atrium: Right atrial size was normal in size. Pericardium: There is no evidence of pericardial effusion. Mitral Valve: The mitral valve is normal in structure. No evidence of mitral valve regurgitation. No evidence of mitral valve stenosis. MV peak gradient, 4.4 mmHg. The mean mitral valve gradient is 2.0 mmHg. Tricuspid Valve: The tricuspid valve is normal in structure. Tricuspid valve regurgitation is trivial. No evidence of tricuspid stenosis. Aortic Valve: The aortic valve is tricuspid. Aortic valve regurgitation is not visualized. Aortic valve sclerosis is present, with no evidence of aortic valve stenosis. Aortic valve mean gradient measures 5.0 mmHg. Aortic valve peak gradient measures 10.4 mmHg. Aortic valve area, by VTI measures 2.73 cm. Pulmonic Valve: The pulmonic valve was not well visualized. Pulmonic valve regurgitation is not visualized. No evidence of pulmonic stenosis. Aorta: The aortic root and ascending aorta are structurally normal, with no evidence of dilitation. Venous: The inferior vena cava is normal in size with greater than 50% respiratory variability, suggesting right atrial pressure of 3 mmHg. IAS/Shunts: No atrial level shunt detected by color flow Doppler.  LEFT VENTRICLE PLAX 2D LVIDd:         5.10 cm      Diastology LVIDs:         3.70 cm      LV e' medial:    9.68 cm/s LV PW:         1.00 cm      LV E/e' medial:  9.8 LV IVS:        1.00 cm      LV e' lateral:   14.40 cm/s LVOT diam:     2.10 cm      LV E/e' lateral: 6.6 LV SV:         83 LV SV Index:   40 LVOT Area:     3.46 cm  LV Volumes (MOD) LV vol d, MOD A4C: 143.0 ml LV vol s, MOD A4C: 44.5 ml LV SV MOD A4C:     143.0 ml RIGHT  VENTRICLE          IVC RV Basal diam:  3.70 cm  IVC diam: 1.90 cm RV Mid diam:    3.10 cm TAPSE (M-mode): 2.0 cm LEFT ATRIUM            Index        RIGHT ATRIUM           Index LA Vol (A4C): 106.0 ml 50.50 ml/m  RA Area:     15.40 cm                                     RA Volume:   37.20 ml  17.72 ml/m  AORTIC VALVE                     PULMONIC VALVE AV Area (Vmax):    2.32 cm      PV Vmax:       1.06 m/s AV Area (Vmean):   2.49 cm      PV Peak grad:  4.5 mmHg AV Area (VTI):     2.73 cm AV Vmax:  161.00 cm/s AV Vmean:          100.000 cm/s AV VTI:            0.306 m AV Peak Grad:      10.4 mmHg AV Mean Grad:      5.0 mmHg LVOT Vmax:         108.00 cm/s LVOT Vmean:        71.900 cm/s LVOT VTI:          0.241 m LVOT/AV VTI ratio: 0.79  AORTA Ao Root diam: 3.20 cm Ao Asc diam:  3.20 cm MITRAL VALVE               TRICUSPID VALVE MV Area (PHT): 3.54 cm    TR Peak grad:   26.4 mmHg MV Area VTI:   2.65 cm    TR Vmax:        257.00 cm/s MV Peak grad:  4.4 mmHg MV Mean grad:  2.0 mmHg    SHUNTS MV Vmax:       1.05 m/s    Systemic VTI:  0.24 m MV Vmean:      61.6 cm/s   Systemic Diam: 2.10 cm MV Decel Time: 214 msec MV E velocity: 95.20 cm/s MV A velocity: 86.40 cm/s MV E/A ratio:  1.10 Mihai Croitoru MD Electronically signed by Thurmon Fair MD Signature Date/Time: 04/17/2023/1:12:12 PM    Final    US Abdomen Limited RUQ (LIVER/GB) Result Date: 04/17/2023 CLINICAL DATA:  90207 Alcoholic (HCC) 90207 EXAM: ULTRASOUND ABDOMEN LIMITED RIGHT UPPER QUADRANT COMPARISON:  None Available. FINDINGS: Gallbladder: No gallstones visualized. No sonographic Murphy sign noted by sonographer. There is wall prominence of the visualized gallbladder which may reflect summation of adjacent pericholecystic fat. Common bile duct: Diameter: Visualized portion measures 5 mm, within normal limits. Liver: No focal lesion identified within the limitations of the exam. Mildly heterogeneous in parenchymal echogenicity. Portal vein is  patent on color Doppler imaging with normal direction of blood flow towards the liver. Other: None. IMPRESSION: 1. Mildly heterogeneous hepatic parenchymal echogenicity. This is nonspecific but can be seen in the setting of underlying hepatocellular disease. 2. Gallbladder wall prominence is favored to reflect summation with adjacent pericholecystic fat. No definitive sonographic evidence of acute cholecystitis. Electronically Signed   By: Meda Klinefelter M.D.   On: 04/17/2023 12:22   CT HEAD WO CONTRAST ( ) Result Date: 04/16/2023 CLINICAL DATA:  Hypertension and altered mental status, initial encounter EXAM: CT HEAD WITHOUT CONTRAST TECHNIQUE: Contiguous axial images were obtained from the base of the skull through the vertex without intravenous contrast. RADIATION DOSE REDUCTION: This exam was performed according to the departmental dose-optimization program which includes automated exposure control, adjustment of the mA and/or kV according to patient size and/or use of iterative reconstruction technique. COMPARISON:  None Available. FINDINGS: Brain: No evidence of acute infarction, hemorrhage, hydrocephalus, extra-axial collection or mass lesion/mass effect. Vascular: No hyperdense vessel or unexpected calcification. Skull: Normal. Negative for fracture or focal lesion. Sinuses/Orbits: No acute finding. Other: None. IMPRESSION: No acute intracranial abnormality noted. Electronically Signed   By: Alcide Clever M.D.   On: 04/16/2023 02:14   DG Chest 2 View Result Date: 04/15/2023 CLINICAL DATA:  Shortness of breath EXAM: CHEST - 2 VIEW COMPARISON:  Chest x-ray 01/19/2023 FINDINGS: The heart size and mediastinal contours are within normal limits. Both lungs are clear. The visualized skeletal structures are unremarkable. IMPRESSION: No active cardiopulmonary disease. Electronically Signed   By: Mcneil Sober.D.  On: 04/15/2023 21:36    Microbiology: Results for orders placed or performed during the  hospital encounter of 04/15/23  Blood Culture (routine x 2)     Status: None   Collection Time: 04/15/23  9:00 PM   Specimen: BLOOD  Result Value Ref Range Status   Specimen Description   Final    BLOOD LEFT ANTECUBITAL Performed at [facility], [address] [place], [place] [address]    Special Requests   Final    BOTTLES DRAWN AEROBIC AND ANAEROBIC Blood Culture results may not be optimal due to an inadequate volume of blood received in culture bottles Performed at [facility], [address], [place], [place] [address]    Culture   Final    NO GROWTH 5 DAYS Performed at [facility], [address] [place], [place] [address]    Report Status 04/20/2023 FINAL  Final  Blood Culture (routine x 2)     Status: None   Collection Time: 04/15/23  9:15 PM   Specimen: BLOOD RIGHT HAND  Result Value Ref Range Status   Specimen Description   Final    BLOOD RIGHT HAND Performed at [facility], [address], [place], [place] [address]    Special Requests   Final    BOTTLES DRAWN AEROBIC AND ANAEROBIC Blood Culture results may not be optimal due to an inadequate volume of blood received in culture bottles Performed at [facility], [address] [place], [place] [address]    Culture   Final    NO GROWTH 5 DAYS Performed at [facility], [address] [place], [place] [address]    Report Status 04/20/2023 FINAL  Final  MRSA Next Gen by PCR, Nasal     Status: None   Collection Time: 04/16/23  3:55 AM   Specimen: Nasal Mucosa; Nasal Swab  Result Value Ref Range Status   MRSA by PCR Next Gen NOT DETECTED NOT DETECTED Final    Comment: (NOTE) The GeneXpert MRSA Assay (FDA approved for NASAL specimens only), is one component of a comprehensive MRSA colonization surveillance program. It is not intended to diagnose MRSA infection nor to guide or monitor treatment for MRSA infections. Test performance is  not FDA approved in patients less than 2 years old. Performed at [facility], [address] [place], [place] [address]     Labs: CBC: Recent Labs  Lab 04/22/23 0548 04/23/23 1802  WBC 7.9 10.0  NEUTROABS 4.6 6.3  HGB 12.9* 14.2  HCT 37.3* 39.7  MCV 98.4 94.5  PLT 346 446*   Basic Metabolic Panel: Recent Labs  Lab 04/17/23 0308 04/17/23 1136 04/19/23 0246 04/19/23 1107 04/21/23 0540 04/21/23 1133 04/22/23 0548 04/22/23 1414 04/22/23 2200 04/23/23 0834 04/23/23 1802  NA 118*   < > 120*   < > 125*   < > 126* 125* 128* 129* 129*  K 3.5  --  3.5  --  3.7  --  3.5  --   --  3.6 3.5  CL 84*  --  87*  --  91*  --  92*  --   --  96* 94*  CO2 23  --  23  --  24  --  22  --   --  22 18*  GLUCOSE 99  --  109*  --  100*  --  87  --   --  82 133*  BUN <  5*  --  <5*  --  6  --  6  --   --  <5* 7  CREATININE 0.35*  --  0.42*  --  0.46*  --  0.43*  --   --  0.49* 0.60*  CALCIUM 8.1*  --  8.3*  --  8.8*  --  8.5*  --   --  8.6* 9.1  MG 1.7  --   --   --   --   --  1.6*  --   --   --   --   PHOS 2.9  --   --   --   --   --   --   --   --   --   --    < > = values in this interval not displayed.   Liver Function Tests: Recent Labs  Lab 04/17/23 0308 04/22/23 0548 04/23/23 1802  AST 52* 31 33  ALT 31 14 15   ALKPHOS 74 71 84  BILITOT 0.4 0.7 0.7  PROT 6.0* 6.7 7.2  ALBUMIN 2.9* 3.1* 3.8   CBG: Recent Labs  Lab 04/23/23 1753  GLUCAP 148*    Discharge time spent: greater than 30 minutes.  Signed: Marinda Elk, MD [facility] 04/23/2023

## 2023-04-23 NOTE — Assessment & Plan Note (Deleted)
 Zyprexa, Lamictal, Celexa held due to concerns over hyponatremia

## 2023-04-23 NOTE — Assessment & Plan Note (Deleted)
 Continue diltiazem As needed intravenous angiotensins for markedly elevated blood pressure

## 2023-04-23 NOTE — Assessment & Plan Note (Deleted)
 Outpatient Humira.

## 2023-04-23 NOTE — ED Triage Notes (Signed)
 Pt was previously admitted for hyponatremia. Pt was being discharged and had seizure in the lobby at discharge. Pt brought to ED for evaluation. Pt appears post ictal on arrival. Pt pupils uneven, but reactive and accommodating. L = 5mm, R = 3 mm.

## 2023-04-23 NOTE — TOC Transition Note (Signed)
 Transition of Care [facility]) - Discharge Note   Patient Details  Name: Billy Anderson MRN: [id number] Date of Birth: [birth year]  Transition [facility] CM/SW Contact:  Otelia Santee, LCSW Phone Number: 04/23/2023, 3:24 PM   Clinical Narrative:    Pt to return home with home health PT/OT/RN through [facility]. HH orders in place. No DME needs. Pt's family to provide transportation at discharge.    Final next level of care: Home w Home Health Services Barriers to Discharge: Barriers Resolved   Patient Goals and CMS Choice Patient states their goals for this hospitalization and ongoing recovery are:: home CMS Medicare.gov Compare Post Acute Care list provided to:: Patient Choice offered to / list presented to : Patient Terry ownership interest in [facility].provided to::  (NA)    Discharge Placement                       Discharge Plan and Services Additional resources added to the After Visit Summary for   In-house Referral: NA Discharge Planning Services: NA Post Acute Care Choice: Home Health          DME Arranged: N/A DME Agency: NA       HH Arranged: RN, PT, OT HH Agency: [facility] Date [facility] Agency Contacted: 04/20/23 Time HH Agency Contacted: 1041 Representative spoke with at [facility] Agency: Cindie  Social Drivers of Health (SDOH) Interventions SDOH Screenings   Food Insecurity: No Food Insecurity (04/18/2023)  Housing: Low Risk  (04/18/2023)  Recent Concern: Housing - High Risk (04/16/2023)  Transportation Needs: No Transportation Needs (04/18/2023)  Utilities: Not At Risk (04/16/2023)  Depression (PHQ2-9): Low Risk  (01/26/2023)  Financial Resource Strain: Medium Risk (07/24/2020)   Received from [facility]  Tobacco Use: High Risk (04/15/2023)  Health Literacy: Low Risk  (10/02/2020)   Received from [facility]     Readmission Risk Interventions    04/18/2023    3:01 PM 01/21/2023   11:08 AM  Readmission Risk Prevention  Plan  Transportation Screening Complete Complete  PCP or Specialist Appt within 5-7 Days Complete Complete  Home Care Screening Complete Complete  Medication Review (RN CM) Complete Complete

## 2023-04-23 NOTE — Plan of Care (Signed)

## 2023-04-23 NOTE — Discharge Instructions (Signed)
 Please take all prescribed medications exactly as instructed as detailed in your medication list. Please consume regular diet.  Please restrict water intake to no more than 1.2 to 1.5 liters daily.  Please abstain from alcohol use. Please increase your physical activity as tolerated. Please maintain all outpatient follow-up appointments including follow-up with your primary care provider and mental health provider. Please inquire with your primary care provider about getting repeat blood work including a repeat chemistry in one week to check your sodium levels.  Please return to the emergency department if you develop worsening weakness or inability to tolerate oral intake.

## 2023-04-23 NOTE — Assessment & Plan Note (Deleted)
 Continuing dressing changes daily with Xeroform gauze to open wound beds, ABD pad and Kerlix wrap Treatment of concurrent cellulitis as noted above

## 2023-04-23 NOTE — Assessment & Plan Note (Deleted)
 Extremely mild Creatine kinase downtrended

## 2023-04-23 NOTE — Assessment & Plan Note (Deleted)
 Clinically improving Continuing Ancef Currently receiving day 7 of 7 Continue local wound care

## 2023-04-23 NOTE — Progress Notes (Signed)
 Discharge medications delivered to patient at bedside [facility]

## 2023-04-23 NOTE — Assessment & Plan Note (Deleted)
 Sodium 126, still moderate target  sodium admission was 108, previous sodium was 133 from January 2025. Status post tolvaptan x 1 on 4/1, per nephrology, will repeat dose of tolvaptan today. 1200cc/day fluid restriction Continuing salt tablets Checking sodium every 6 hours per nephrology recommendations Etiology possibly secondary to alcohol abuse, SSRI use prior to discharge

## 2023-04-23 NOTE — Progress Notes (Signed)
 Mobility Specialist - Progress Note   04/23/23 1102  Mobility  Activity Ambulated with assistance in hallway  Level of Assistance Contact guard assist, steadying assist  Assistive Device Front wheel walker  Distance Ambulated (ft) 500 ft  Range of Motion/Exercises Active  Activity Response Tolerated well  Mobility Referral Yes  Mobility visit 1 Mobility  Mobility Specialist Start Time (ACUTE ONLY) 1050  Mobility Specialist Stop Time (ACUTE ONLY) 1102  Mobility Specialist Time Calculation (min) (ACUTE ONLY) 12 min   Pt was found in bed and agreeable to ambulate. C/o 4/10 pain from feet. At EOS returned to recliner chair with all needs met. Call bell in reach and chair alarm on.  Billey Chang Mobility Specialist

## 2023-04-23 NOTE — Assessment & Plan Note (Deleted)
 Alcohol abuse likely contributed to degree of patient's hyponatremia Counseled on cessation No evidence of withdrawal

## 2023-04-23 NOTE — Care Management Important Message (Signed)
 Important Message  Patient Details IM Letter given to the Patient. Name: Lawrnce Reyez III MRN: [id number] Date of Birth: [birth year]   Important Message Given:  Yes - Medicare IM     Caren Macadam 04/23/2023, 2:36 PM

## 2023-04-23 NOTE — ED Provider Notes (Signed)
 [place] EMERGENCY DEPARTMENT AT [facility] Provider Note   CSN: [id number] Arrival date & time: 04/23/23  1749     History Chief Complaint  Patient presents with   Seizures    HPI Billy Anderson is a 58 y.o. male presenting for seizure episode.  58 year old male with a expansive medical history.  Admitted the past week for hyponatremia.  Was discharged in the hospital earlier today and had a seizure episode right after discharge.  Brought back to the ER per rapid response team.  Patient very confused initially but gradually improved.  He denies any new symptoms as he improved.   Patient's recorded medical, surgical, social, medication list and allergies were reviewed in the Snapshot window as part of the initial history.   Review of Systems   Review of Systems  Constitutional:  Negative for chills and fever.  HENT:  Negative for ear pain and sore throat.   Eyes:  Negative for pain and visual disturbance.  Respiratory:  Negative for cough and shortness of breath.   Cardiovascular:  Negative for chest pain and palpitations.  Gastrointestinal:  Negative for abdominal pain and vomiting.  Genitourinary:  Negative for dysuria and hematuria.  Musculoskeletal:  Negative for arthralgias and back pain.  Skin:  Negative for color change and rash.  Neurological:  Positive for seizures. Negative for syncope.  All other systems reviewed and are negative.   Physical Exam Updated Vital Signs BP 139/70 (BP Location: Left Arm)   Pulse 61   Temp 98.2 F (36.8 C) (Oral)   Resp 15   SpO2 95%  Physical Exam Vitals and nursing note reviewed.  Constitutional:      General: He is not in acute distress.    Appearance: He is well-developed.  HENT:     Head: Normocephalic and atraumatic.  Eyes:     Conjunctiva/sclera: Conjunctivae normal.  Cardiovascular:     Rate and Rhythm: Normal rate and regular rhythm.     Heart sounds: No murmur heard. Pulmonary:     Effort:  Pulmonary effort is normal. No respiratory distress.     Breath sounds: Normal breath sounds.  Abdominal:     Palpations: Abdomen is soft.     Tenderness: There is no abdominal tenderness.  Musculoskeletal:        General: No swelling.     Cervical back: Neck supple.  Skin:    General: Skin is warm and dry.     Capillary Refill: Capillary refill takes less than 2 seconds.  Neurological:     Mental Status: He is alert.  Psychiatric:        Mood and Affect: Mood normal.      ED Course/ Medical Decision Making/ A&P    Procedures Procedures   Medications Ordered in ED Medications  enoxaparin (LOVENOX) injection 40 mg (40 mg Subcutaneous Given 04/23/23 2217)  acetaminophen (TYLENOL) tablet 650 mg (has no administration in time range)    Or  acetaminophen (TYLENOL) suppository 650 mg (has no administration in time range)  ondansetron (ZOFRAN) tablet 4 mg (has no administration in time range)    Or  ondansetron (ZOFRAN) injection 4 mg (has no administration in time range)  senna-docusate (Senokot-S) tablet 1 tablet (has no administration in time range)  diltiazem (CARDIZEM CD) 24 hr capsule 360 mg (has no administration in time range)  folic acid (FOLVITE) tablet 1 mg (has no administration in time range)  lamoTRIgine (LAMICTAL) tablet 200 mg (200 mg Oral Given 04/23/23  2217)  OLANZapine (ZYPREXA) tablet 20 mg (20 mg Oral Given 04/23/23 2217)  oxybutynin (DITROPAN-XL) 24 hr tablet 10 mg (10 mg Oral Given 04/23/23 2221)  pantoprazole (PROTONIX) EC tablet 40 mg (has no administration in time range)  thiamine (VITAMIN B1) tablet 100 mg (has no administration in time range)  aspirin EC tablet 81 mg (81 mg Oral Given 04/23/23 2217)  sodium chloride tablet 2 g (2 g Oral Given 04/23/23 2251)  lactated ringers bolus 1,000 mL (0 mLs Intravenous Stopped 04/23/23 2218)   Medical Decision Making:   Billy Anderson is a 58 y.o. male who presented to the ED today with altered mental status detailed  above.    Patient placed on continuous vitals and telemetry monitoring while in ED which was reviewed periodically.  Complete initial physical exam performed, notably the patient  was HDS in NAD.    Reviewed and confirmed nursing documentation for past medical history, family history, social history.    Initial Assessment:   With the patient's presentation of altered mental status, most likely diagnosis is delerium 2/2 infectious etiology (UTI/CAP/URI) vs metabolic abnormality (Na/K/Mg/Ca) vs nonspecific etiology. Other diagnoses were considered including (but not limited to) CVA, ICH, intracranial mass, critical dehydration, heptatic dysfunction, uremia, hypercarbia, intoxication, endrocrine abnormality, toxidrome. These are considered less likely due to history of present illness and physical exam findings.   This is most consistent with an acute life/limb threatening illness complicated by underlying chronic conditions.  Initial Plan:  CTH to evaluate for intracranial etiology of patient's symptoms  Screening labs including CBC and Metabolic panel to evaluate for infectious or metabolic etiology of disease.  EKG to evaluate for cardiac pathology Objective evaluation as below reviewed   Initial Study Results:   Laboratory  All laboratory results reviewed without evidence of clinically relevant pathology.   EKG EKG was reviewed independently. Rate, rhythm, axis, intervals all examined and without medically relevant abnormality. ST segments without concerns for elevations.    Radiology:  All images reviewed independently. Agree with radiology report at this time.   CT HEAD WO CONTRAST ( ) Result Date: 04/23/2023 CLINICAL DATA:  Seizure.  Postictal state.  Headache. EXAM: CT HEAD WITHOUT CONTRAST TECHNIQUE: Contiguous axial images were obtained from the base of the skull through the vertex without intravenous contrast. RADIATION DOSE REDUCTION: This exam was performed according to the  departmental dose-optimization program which includes automated exposure control, adjustment of the mA and/or kV according to patient size and/or use of iterative reconstruction technique. COMPARISON:  04/16/2023 FINDINGS: Brain: No evidence of intracranial hemorrhage, acute infarction, hydrocephalus, extra-axial collection, or mass lesion/mass effect. Mild diffuse cerebral atrophy, without significant change. Vascular:  No hyperdense vessel or other acute findings. Skull: No evidence of fracture or other significant bone abnormality. Sinuses/Orbits:  No acute findings. Other: None. IMPRESSION: No acute intracranial abnormality. Stable mild cerebral atrophy. Electronically Signed   By: Danae Orleans M.D.   On: 04/23/2023 19:28   ECHOCARDIOGRAM COMPLETE Result Date: 04/17/2023    ECHOCARDIOGRAM REPORT   Patient Name:   Billy Anderson Date of Exam: 04/17/2023 Medical Rec #:  [id number]        Height:       72.0 in Accession #:    [id number]       Weight:       193.1 lb Date of Birth:  07/23/1965        BSA:          2.099 m Patient Age:  57 years         BP:           165/67 mmHg Patient Gender: M                HR:           66 bpm. Exam Location:  Inpatient Procedure: 2D Echo, Cardiac Doppler and Color Doppler (Both Spectral and Color            Flow Doppler were utilized during procedure). Indications:    Cardiomyopathy  History:        Patient has no prior history of Echocardiogram examinations.                 Arrythmias:Atrial Fibrillation; Risk Factors:ETOH and                 Hypertension.  Sonographer:    Amy Chionchio Referring Phys: [id number] MURALI RAMASWAMY IMPRESSIONS  1. Left ventricular ejection fraction, by estimation, is 60 to 65%. The left ventricle has normal function. The left ventricle has no regional wall motion abnormalities. Left ventricular diastolic parameters were normal.  2. Right ventricular systolic function is normal. The right ventricular size is normal. There is normal pulmonary artery  systolic pressure. The estimated right ventricular systolic pressure is 29.4 mmHg.  3. Left atrial size was severely dilated.  4. The mitral valve is normal in structure. No evidence of mitral valve regurgitation. No evidence of mitral stenosis.  5. The aortic valve is tricuspid. Aortic valve regurgitation is not visualized. Aortic valve sclerosis is present, with no evidence of aortic valve stenosis.  6. The inferior vena cava is normal in size with greater than 50% respiratory variability, suggesting right atrial pressure of 3 mmHg. FINDINGS  Left Ventricle: Left ventricular ejection fraction, by estimation, is 60 to 65%. The left ventricle has normal function. The left ventricle has no regional wall motion abnormalities. The left ventricular internal cavity size was normal in size. There is  no left ventricular hypertrophy. Left ventricular diastolic parameters were normal. Right Ventricle: The right ventricular size is normal. No increase in right ventricular wall thickness. Right ventricular systolic function is normal. There is normal pulmonary artery systolic pressure. The tricuspid regurgitant velocity is 2.57 m/s, and  with an assumed right atrial pressure of 3 mmHg, the estimated right ventricular systolic pressure is 29.4 mmHg. Left Atrium: Left atrial size was severely dilated. Right Atrium: Right atrial size was normal in size. Pericardium: There is no evidence of pericardial effusion. Mitral Valve: The mitral valve is normal in structure. No evidence of mitral valve regurgitation. No evidence of mitral valve stenosis. MV peak gradient, 4.4 mmHg. The mean mitral valve gradient is 2.0 mmHg. Tricuspid Valve: The tricuspid valve is normal in structure. Tricuspid valve regurgitation is trivial. No evidence of tricuspid stenosis. Aortic Valve: The aortic valve is tricuspid. Aortic valve regurgitation is not visualized. Aortic valve sclerosis is present, with no evidence of aortic valve stenosis. Aortic valve  mean gradient measures 5.0 mmHg. Aortic valve peak gradient measures 10.4 mmHg. Aortic valve area, by VTI measures 2.73 cm. Pulmonic Valve: The pulmonic valve was not well visualized. Pulmonic valve regurgitation is not visualized. No evidence of pulmonic stenosis. Aorta: The aortic root and ascending aorta are structurally normal, with no evidence of dilitation. Venous: The inferior vena cava is normal in size with greater than 50% respiratory variability, suggesting right atrial pressure of 3 mmHg. IAS/Shunts: No atrial level shunt detected by color flow Doppler.  LEFT VENTRICLE PLAX 2D LVIDd:         5.10 cm      Diastology LVIDs:         3.70 cm      LV e' medial:    9.68 cm/s LV PW:         1.00 cm      LV E/e' medial:  9.8 LV IVS:        1.00 cm      LV e' lateral:   14.40 cm/s LVOT diam:     2.10 cm      LV E/e' lateral: 6.6 LV SV:         83 LV SV Index:   40 LVOT Area:     3.46 cm  LV Volumes (MOD) LV vol d, MOD A4C: 143.0 ml LV vol s, MOD A4C: 44.5 ml LV SV MOD A4C:     143.0 ml RIGHT VENTRICLE          IVC RV Basal diam:  3.70 cm  IVC diam: 1.90 cm RV Mid diam:    3.10 cm TAPSE (M-mode): 2.0 cm LEFT ATRIUM            Index        RIGHT ATRIUM           Index LA Vol (A4C): 106.0 ml 50.50 ml/m  RA Area:     15.40 cm                                     RA Volume:   37.20 ml  17.72 ml/m  AORTIC VALVE                     PULMONIC VALVE AV Area (Vmax):    2.32 cm      PV Vmax:       1.06 m/s AV Area (Vmean):   2.49 cm      PV Peak grad:  4.5 mmHg AV Area (VTI):     2.73 cm AV Vmax:           161.00 cm/s AV Vmean:          100.000 cm/s AV VTI:            0.306 m AV Peak Grad:      10.4 mmHg AV Mean Grad:      5.0 mmHg LVOT Vmax:         108.00 cm/s LVOT Vmean:        71.900 cm/s LVOT VTI:          0.241 m LVOT/AV VTI ratio: 0.79  AORTA Ao Root diam: 3.20 cm Ao Asc diam:  3.20 cm MITRAL VALVE               TRICUSPID VALVE MV Area (PHT): 3.54 cm    TR Peak grad:   26.4 mmHg MV Area VTI:   2.65 cm    TR  Vmax:        257.00 cm/s MV Peak grad:  4.4 mmHg MV Mean grad:  2.0 mmHg    SHUNTS MV Vmax:       1.05 m/s    Systemic VTI:  0.24 m MV Vmean:      61.6 cm/s   Systemic Diam: 2.10 cm MV Decel Time: 214 msec MV E velocity: 95.20 cm/s MV A velocity: 86.40 cm/s MV  E/A ratio:  1.10 Rachelle Hora Croitoru MD Electronically signed by Thurmon Fair MD Signature Date/Time: 04/17/2023/1:12:12 PM    Final    US Abdomen Limited RUQ (LIVER/GB) Result Date: 04/17/2023 CLINICAL DATA:  90207 Alcoholic (HCC) 90207 EXAM: ULTRASOUND ABDOMEN LIMITED RIGHT UPPER QUADRANT COMPARISON:  None Available. FINDINGS: Gallbladder: No gallstones visualized. No sonographic Murphy sign noted by sonographer. There is wall prominence of the visualized gallbladder which may reflect summation of adjacent pericholecystic fat. Common bile duct: Diameter: Visualized portion measures 5 mm, within normal limits. Liver: No focal lesion identified within the limitations of the exam. Mildly heterogeneous in parenchymal echogenicity. Portal vein is patent on color Doppler imaging with normal direction of blood flow towards the liver. Other: None. IMPRESSION: 1. Mildly heterogeneous hepatic parenchymal echogenicity. This is nonspecific but can be seen in the setting of underlying hepatocellular disease. 2. Gallbladder wall prominence is favored to reflect summation with adjacent pericholecystic fat. No definitive sonographic evidence of acute cholecystitis. Electronically Signed   By: Meda Klinefelter M.D.   On: 04/17/2023 12:22   CT HEAD WO CONTRAST ( ) Result Date: 04/16/2023 CLINICAL DATA:  Hypertension and altered mental status, initial encounter EXAM: CT HEAD WITHOUT CONTRAST TECHNIQUE: Contiguous axial images were obtained from the base of the skull through the vertex without intravenous contrast. RADIATION DOSE REDUCTION: This exam was performed according to the departmental dose-optimization program which includes automated exposure control, adjustment  of the mA and/or kV according to patient size and/or use of iterative reconstruction technique. COMPARISON:  None Available. FINDINGS: Brain: No evidence of acute infarction, hemorrhage, hydrocephalus, extra-axial collection or mass lesion/mass effect. Vascular: No hyperdense vessel or unexpected calcification. Skull: Normal. Negative for fracture or focal lesion. Sinuses/Orbits: No acute finding. Other: None. IMPRESSION: No acute intracranial abnormality noted. Electronically Signed   By: Alcide Clever M.D.   On: 04/16/2023 02:14   DG Chest 2 View Result Date: 04/15/2023 CLINICAL DATA:  Shortness of breath EXAM: CHEST - 2 VIEW COMPARISON:  Chest x-ray 01/19/2023 FINDINGS: The heart size and mediastinal contours are within normal limits. Both lungs are clear. The visualized skeletal structures are unremarkable. IMPRESSION: No active cardiopulmonary disease. Electronically Signed   By: Darliss Cheney M.D.   On: 04/15/2023 21:36     Disposition:   Based on the above findings, I believe this patient is stable for admission.    Patient/family educated about specific findings on our evaluation and explained exact reasons for admission.  Patient/family educated about clinical situation and time was allowed to answer questions.   Admission team communicated with and agreed with need for admission. Patient admitted. Patient ready to move at this time.     Emergency Department Medication Summary:   Medications  enoxaparin (LOVENOX) injection 40 mg (40 mg Subcutaneous Given 04/23/23 2217)  acetaminophen (TYLENOL) tablet 650 mg (has no administration in time range)    Or  acetaminophen (TYLENOL) suppository 650 mg (has no administration in time range)  ondansetron (ZOFRAN) tablet 4 mg (has no administration in time range)    Or  ondansetron (ZOFRAN) injection 4 mg (has no administration in time range)  senna-docusate (Senokot-S) tablet 1 tablet (has no administration in time range)  diltiazem (CARDIZEM CD)  24 hr capsule 360 mg (has no administration in time range)  folic acid (FOLVITE) tablet 1 mg (has no administration in time range)  lamoTRIgine (LAMICTAL) tablet 200 mg (200 mg Oral Given 04/23/23 2217)  OLANZapine (ZYPREXA) tablet 20 mg (20 mg Oral Given 04/23/23 2217)  oxybutynin (  DITROPAN-XL) 24 hr tablet 10 mg (10 mg Oral Given 04/23/23 2221)  pantoprazole (PROTONIX) EC tablet 40 mg (has no administration in time range)  thiamine (VITAMIN B1) tablet 100 mg (has no administration in time range)  aspirin EC tablet 81 mg (81 mg Oral Given 04/23/23 2217)  sodium chloride tablet 2 g (2 g Oral Given 04/23/23 2251)  lactated ringers bolus 1,000 mL (0 mLs Intravenous Stopped 04/23/23 2218)          Clinical Impression:  1. Seizure [facility])      Admit   Final Clinical Impression(s) / ED Diagnoses Final diagnoses:  Seizure [facility])    Rx / DC Orders ED Discharge Orders     None         Glyn Ade, MD 04/23/23 2357

## 2023-04-23 NOTE — Progress Notes (Signed)
 Occupational Therapy Treatment Patient Details Name: Billy Anderson MRN: [id number] DOB: Jan 07, 1966 Today's Date: 04/23/2023   History of present illness Pt admitted from home with LE edema/cellulitis, Rhabdomyolysis, hyponatremia, hypocalcemia and inability to stand 2* bil foot pain with WB. PMH: hyponatremia, seizures, bipolar, dysrythmia, Pneumonia, ACDF   OT comments  Patient up in recliner prior to OT session and open to all therapy presented. Improved with overall BADL's standing sink side with RW for support with brief seated rest breaks. Patient continues with slower overall processing and decreased higher level balance and mobility. Patient and nursing report he will be discharging today. If patient remains in hospital, he continues to be a candidate for Acute care OT. OT recommending assistance from SO and HHOT if able upon discharge from hospital.       If plan is discharge home, recommend the following:  A little help with walking and/or transfers;A little help with bathing/dressing/bathroom;Assistance with cooking/housework;Direct supervision/assist for medications management;Direct supervision/assist for financial management;Assist for transportation;Help with stairs or ramp for entrance;Supervision due to cognitive status   Equipment Recommendations  None recommended by OT       Precautions / Restrictions Precautions Precautions: Fall Recall of Precautions/Restrictions: Intact Precaution/Restrictions Comments: hx seizures Restrictions Weight Bearing Restrictions Per Provider Order: No       Mobility Bed Mobility Overal bed mobility: Needs Assistance Bed Mobility: Sit to Supine     Supine to sit: Modified independent (Device/Increase time), HOB elevated, Used rails          Transfers Overall transfer level: Needs assistance Equipment used: Rolling walker (2 wheels) Transfers: Sit to/from Stand, Bed to chair/wheelchair/BSC Sit to Stand: Supervision     Step  pivot transfers: Supervision     General transfer comment: verbal cues for hand placement     Balance Overall balance assessment: Needs assistance Sitting-balance support: Feet supported Sitting balance-Leahy Scale: Good     Standing balance support: Single extremity supported, During functional activity Standing balance-Leahy Scale: Fair                             ADL either performed or assessed with clinical judgement   ADL Overall ADL's : Needs assistance/impaired Eating/Feeding: Modified independent;Bed level   Grooming: Wash/dry hands;Wash/dry face;Oral care;Applying deodorant;Brushing hair;Supervision/safety;Standing   Upper Body Bathing: Standing;Supervision/ safety   Lower Body Bathing: Contact guard assist;Sit to/from stand;Sitting/lateral leans                         General ADL Comments: addressed standing tolerance and functioanl balance and reach this visit with significant improvmemnent from IE    Extremity/Trunk Assessment Upper Extremity Assessment Upper Extremity Assessment: Right hand dominant   Lower Extremity Assessment Lower Extremity Assessment: Generalized weakness                 Communication Communication Communication: Impaired Factors Affecting Communication: Hearing impaired   Cognition Arousal: Alert Behavior During Therapy: Flat affect Cognition: No apparent impairments             OT - Cognition Comments: slower processing but overall WFL's                 Following commands: Intact        Cueing   Cueing Techniques: Verbal cues  Exercises      Shoulder Instructions       General Comments L LE wrap intact    Pertinent  Vitals/ Pain       Pain Assessment Pain Assessment: No/denies pain   Frequency  Min 2X/week        Progress Toward Goals  OT Goals(current goals can now be found in the care plan section)  Progress towards OT goals: Progressing toward goals  Acute  Rehab OT Goals Patient Stated Goal: to go home later today OT Goal Formulation: With patient Time For Goal Achievement: 05/04/23 Potential to Achieve Goals: Good ADL Goals Pt Will Perform Lower Body Bathing: with supervision;with adaptive equipment;sit to/from stand Pt Will Perform Lower Body Dressing: with contact guard assist;with adaptive equipment;sitting/lateral leans;sit to/from stand Pt Will Transfer to Toilet: with contact guard assist;grab bars;ambulating Pt/caregiver will Perform Home Exercise Program: Increased strength;Independently;With written HEP provided   AM-PAC OT "6 Clicks" Daily Activity     Outcome Measure   Help from another person eating meals?: None Help from another person taking care of personal grooming?: A Little Help from another person toileting, which includes using toliet, bedpan, or urinal?: A Little Help from another person bathing (including washing, rinsing, drying)?: A Little Help from another person to put on and taking off regular upper body clothing?: A Little Help from another person to put on and taking off regular lower body clothing?: A Little 6 Click Score: 19    End of Session Equipment Utilized During Treatment: Gait belt;Rolling walker (2 wheels)  OT Visit Diagnosis: Unsteadiness on feet (R26.81);Muscle weakness (generalized) (M62.81)   Activity Tolerance Patient tolerated treatment well   Patient Left in bed;with call bell/phone within reach;with bed alarm set;with nursing/sitter in room   Nurse Communication  (updated on ADL's completed)        Time: 1445-1459 OT Time Calculation (min): 14 min  Charges: OT General Charges $OT Visit: 1 Visit OT Treatments $Self Care/Home Management : 8-22 mins Regina Ganci OT/L Acute Rehabilitation Department  [phone]  04/23/2023, 3:14 PM

## 2023-04-23 NOTE — Progress Notes (Signed)
 [facility] Progress Note  Subjective:  Seen in room Na+ 129 this am  Vitals:   04/22/23 1306 04/22/23 2009 04/23/23 0500 04/23/23 0514  BP: 137/67 135/76  (!) 157/84  Pulse: 65 81  (!) 59  Resp:  18  16  Temp: 97.9 F (36.6 C) 97.7 F (36.5 C)  98 F (36.7 C)  TempSrc: Oral     SpO2: 100% 100%  99%  Weight:   77.6 kg   Height:        Exam: Gen alert, no distress Sclera anicteric, throat clear  No jvd or bruits Chest clear bilat to bases RRR no MRG Abd soft ntnd no mass or ascites +bs MS no joint effusions or deformity Ext no RLE edema, 1+ LLE pretib edema/ wrapped Neuro is alert, Ox 3 , nf        Renal-related home meds: Cardizem CD 180 bid Salt tabs 2 gm tid Others: PPI, zyprexa, naltrexone, lamictal, humira, gabapentin, celexa, asa   01/19/23 --> UOsm 406, UNa 90 04/15/23 --> UOsm 317, UNa 37 04/16/23  --> UOsm 311, UNa 62       Assessment/ Plan: Hyponatremia - Na+ 119-120 here on admission in patient w/ hx etoh abuse, seizures, depression. He was euvolemic on exam except for mild L lower leg swelling prob due to infection. No evidence of CHF or cirrhosis. Cortisol and TSH were wnl. Etoh abuse can be associated w/ low solute hyponatremia but the urine osm should be sig lower (< 100). Urine lytes c/w SIADH picture, due to SSRI vs other. SSRI has not been given here and should be avoided for now. S/P tolvaptan 15mg  x 1 on 4/01.  With salt tabs and fluid restriction did not get a good response (likely not following fluid restriction). Gave 2nd dose of tolvaptan 15mg  and today Na+ 129. Ok for discharge. Will arrange for f/u in the office at [facility]. Would d/c him on 1000 fluid restriction and salt tabs 2gm bid for 2 weeks. If he has PCP would recommend to get labs in 7-10 days. Will sign off.  H/o etoh abuse Bipolar d/o - avoid SSRI's please. Is on zyprexa here.  Seizure d/o - on lamictal HTN - on cardizem and hydralazine prn      Vinson Moselle MD   [facility] 04/23/2023, 10:51 AM  Recent Labs  Lab 04/17/23 0308 04/19/23 0246 04/22/23 0548 04/23/23 0834  HGB  --   --  12.9*  --   ALBUMIN 2.9*  --  3.1*  --   CALCIUM 8.1*   < > 8.5* 8.6*  PHOS 2.9  --   --   --   CREATININE 0.35*   < > 0.43* 0.49*  K 3.5   < > 3.5 3.6   < > = values in this interval not displayed.   No results for input(s): "IRON", "TIBC", "FERRITIN" in the last 168 hours. Inpatient medications:  aspirin EC  81 mg Oral QHS   Chlorhexidine Gluconate Cloth  6 each Topical Q2200   diltiazem  180 mg Oral Q12H   enoxaparin (LOVENOX) injection  40 mg Subcutaneous Q24H   folic acid  1 mg Oral Daily   lamoTRIgine  200 mg Oral QHS   multivitamin with minerals  1 tablet Oral Daily   OLANZapine  20 mg Oral QHS   oxybutynin  10 mg Oral QHS   pantoprazole  40 mg Oral BID   sodium chloride  1 g Oral BID WC  thiamine  100 mg Oral Daily   Or   thiamine  100 mg Intravenous Daily     docusate sodium, hydrALAZINE, mouth rinse, polyethylene glycol, tiZANidine

## 2023-04-23 NOTE — H&P (Signed)
 PCP:   Donell Beers, FNP   Chief Complaint:  Seizure  HPI: This is a 58 year old male with past medical history significant for chronic hyponatremia, alcohol abuse, bipolar disorder, seizures, psoriatic arthritis, HTN.  He was admitted 3/28 -4/4 with lower extremity cellulitis and wounds and hyponatremia presenting sodium 104.  Patient admitted to ICU, serial sodium measurements done.  IV fluid and tolvaptan x 2 given.  Sodium increased to 129.  During hospitalization the patient's Lamictal was held.  Lamictal resumed, dose given earlier today, patient discharged.  While being discharged the patient had a tonic-clonic seizure.  Overnight admission requested.  No further seizure activity since patient in the ER.  Review of Systems:  Per HPI  Past Medical History: Past Medical History:  Diagnosis Date   Arthritis    psoriatic arthritis   Bipolar disorder (HCC)    Dysrhythmia    PAF   GERD (gastroesophageal reflux disease)    Hypertension    Pneumonia    Seizures (HCC)    Past Surgical History:  Procedure Laterality Date   ANTERIOR CERVICAL DECOMP/DISCECTOMY FUSION N/A 03/20/2022   Procedure: Cervical Three-Four, Cervical Four-Five Anterior Cervical Discectomy Fusion;  Surgeon: Coletta Memos, MD;  Location: [facility] OR;  Service: Neurosurgery;  Laterality: N/A;  RM 20 to follow 3C   HAND SURGERY Left    carpal tunnel release    Medications: Prior to Admission medications   Medication Sig Start Date End Date Taking? Authorizing Provider  aspirin EC 81 MG tablet Take 1 tablet (81 mg total) by mouth daily. Patient taking differently: Take 81 mg by mouth at bedtime. 10/31/21   Love, Evlyn Kanner, PA-C  Calcium Carb-Cholecalciferol (CALCIUM 600+D3 PO) Take 1 tablet by mouth 2 (two) times daily.    [provider]  diltiazem (CARDIZEM CD) 180 MG 24 hr capsule TAKE 2 CAPSULES BY MOUTH DAILY Patient taking differently: Take 180 mg by mouth in the morning and at bedtime. 02/08/23    Donell Beers, FNP  Fexofenadine HCl (ALLEGRA PO) Take 180 mg by mouth daily.    [provider]  folic acid (FOLVITE) 1 MG tablet Take 1 tablet (1 mg total) by mouth daily. 04/23/23   Shalhoub, Deno Lunger, MD  lamoTRIgine (LAMICTAL) 200 MG tablet Take 1 tablet (200 mg total) by mouth at bedtime. 04/23/23   Shalhoub, Deno Lunger, MD  OLANZapine (ZYPREXA) 20 MG tablet Take 1 tablet (20 mg total) by mouth at bedtime. 04/23/23   Shalhoub, Deno Lunger, MD  oxybutynin (DITROPAN-XL) 10 MG 24 hr tablet TAKE 1 TABLET BY MOUTH AT BEDTIME 02/01/23   Paseda, Baird Kay, FNP  pantoprazole (PROTONIX) 40 MG tablet Take 1 tablet (40 mg total) by mouth daily. 03/17/23   Donell Beers, FNP  sodium chloride 1 g tablet Take 2 tablets (2 g total) by mouth 2 (two) times daily with a meal. 04/23/23   Shalhoub, Deno Lunger, MD  thiamine (VITAMIN B-1) 100 MG tablet Take 1 tablet (100 mg total) by mouth daily. 04/24/23   Shalhoub, Deno Lunger, MD  tiZANidine (ZANAFLEX) 4 MG tablet Take 4 mg by mouth daily as needed for muscle spasms. 01/31/23   [provider]    Allergies:   Allergies  Allergen Reactions   Ativan [Lorazepam] Other (See Comments)    Confusion  Hallucinations    Roxicodone [Oxycodone] Itching   Ultram [Tramadol] Other (See Comments)    Seizures    Vistaril [Hydroxyzine] Itching and Other (See Comments)  Sedation Not effective in reducing anxiety    Social History:  reports that he has been smoking cigarettes. He does not have any smokeless tobacco history on file. He reports that he does not currently use alcohol after a past usage of about 48.0 standard drinks of alcohol per week. He reports that he does not use drugs.  Family History: Family History  Problem Relation Age of Onset   Bipolar disorder Mother     Physical Exam: Vitals:   04/23/23 1758 04/23/23 1811 04/23/23 1916  BP: (!) 159/106  139/70  Pulse: (!) 126  61  Resp: 18  15  Temp:  98 F (36.7 C)   TempSrc:  Oral    SpO2: 96%  95%    General:  A&Ox3, well developed, in no distress.  Appears older than stated age Eyes: No scleral icterus ENT: Moist oral mucosa, neck supple Lungs: CTA B/L, no use of accessory muscles Cardiovascular: RRR, no murmurs. No carotid bruits, no JVD Abdomen: soft, positive BS, NTND GU: not examined Neuro: CN II - XII grossly intact Musculoskeletal: Moves all extremity.  Skin: LLE edema with blisters Psych: appropriate patient   Labs on Admission:  Recent Labs    04/22/23 0548 04/22/23 1414 04/23/23 0834 04/23/23 1802  NA 126*   < > 129* 129*  K 3.5  --  3.6 3.5  CL 92*  --  96* 94*  CO2 22  --  22 18*  GLUCOSE 87  --  82 133*  BUN 6  --  <5* 7  CREATININE 0.43*  --  0.49* 0.60*  CALCIUM 8.5*  --  8.6* 9.1  MG 1.6*  --   --   --    < > = values in this interval not displayed.   Recent Labs    04/22/23 0548 04/23/23 1802  AST 31 33  ALT 14 15  ALKPHOS 71 84  BILITOT 0.7 0.7  PROT 6.7 7.2  ALBUMIN 3.1* 3.8   Recent Labs    04/23/23 1802  LIPASE 24   Recent Labs    04/22/23 0548 04/23/23 1802  WBC 7.9 10.0  NEUTROABS 4.6 6.3  HGB 12.9* 14.2  HCT 37.3* 39.7  MCV 98.4 94.5  PLT 346 446*    Micro Results: Recent Results (from the past 240 hours)  Blood Culture (routine x 2)     Status: None   Collection Time: 04/15/23  9:00 PM   Specimen: BLOOD  Result Value Ref Range Status   Specimen Description   Final    BLOOD LEFT ANTECUBITAL Performed at [facility], [address], [place], [place] [address]    Special Requests   Final    BOTTLES DRAWN AEROBIC AND ANAEROBIC Blood Culture results may not be optimal due to an inadequate volume of blood received in culture bottles Performed at [facility], [address], [place], [place] [address]    Culture   Final    NO GROWTH 5 DAYS Performed at [facility], [address], [place], [place] [address]    Report Status 04/20/2023 FINAL  Final   Blood Culture (routine x 2)     Status: None   Collection Time: 04/15/23  9:15 PM   Specimen: BLOOD RIGHT HAND  Result Value Ref Range Status   Specimen Description   Final    BLOOD RIGHT HAND Performed at [facility], [address], [place], [place] [address]  Special Requests   Final    BOTTLES DRAWN AEROBIC AND ANAEROBIC Blood Culture results may not be optimal due to an inadequate volume of blood received in culture bottles Performed at [facility], [address], [place], [place] [address]    Culture   Final    NO GROWTH 5 DAYS Performed at [facility], [address], [place], [place] [address]    Report Status 04/20/2023 FINAL  Final  MRSA Next Gen by PCR, Nasal     Status: None   Collection Time: 04/16/23  3:55 AM   Specimen: Nasal Mucosa; Nasal Swab  Result Value Ref Range Status   MRSA by PCR Next Gen NOT DETECTED NOT DETECTED Final    Comment: (NOTE) The GeneXpert MRSA Assay (FDA approved for NASAL specimens only), is one component of a comprehensive MRSA colonization surveillance program. It is not intended to diagnose MRSA infection nor to guide or monitor treatment for MRSA infections. Test performance is not FDA approved in patients less than 68 years old. Performed at [facility], [address], [place], [place] [address]      Radiological Exams on Admission: CT HEAD WO CONTRAST ( ) Result Date: 04/23/2023 CLINICAL DATA:  Seizure.  Postictal state.  Headache. EXAM: CT HEAD WITHOUT CONTRAST TECHNIQUE: Contiguous axial images were obtained from the base of the skull through the vertex without intravenous contrast. RADIATION DOSE REDUCTION: This exam was performed according to the departmental dose-optimization program which includes automated exposure control, adjustment of the mA and/or kV according to patient size and/or use of iterative reconstruction technique. COMPARISON:  04/16/2023 FINDINGS: Brain:  No evidence of intracranial hemorrhage, acute infarction, hydrocephalus, extra-axial collection, or mass lesion/mass effect. Mild diffuse cerebral atrophy, without significant change. Vascular:  No hyperdense vessel or other acute findings. Skull: No evidence of fracture or other significant bone abnormality. Sinuses/Orbits:  No acute findings. Other: None. IMPRESSION: No acute intracranial abnormality. Stable mild cerebral atrophy. Electronically Signed   By: Danae Orleans M.D.   On: 04/23/2023 19:28    Assessment/Plan Present on Admission:   Seizure disorder -Lamictal level ordered.  Seizure precaution -Additional dose of Lamictal given -Ativan as needed -Monitor overnight   Chronic hyponatremia -Continue sodium bicarb tablet twice daily   Left lower extremity cellulitis and wounds -Treated with cefazolin during hospitalization.  Was followed by wound care. -Wound care consult placed   Bipolar I disorder, most recent episode (or current) manic (HCC) -Zyprexa resumed   Essential hypertension //  Paroxysmal atrial fibrillation (HCC) -Cardizem and baby aspirin resumed   Psoriatic arthritis (HCC)  Alcohol abuse history -folic acid, thiamine resumed  Royetta Probus 04/23/2023, 9:46 PM

## 2023-04-24 ENCOUNTER — Other Ambulatory Visit (HOSPITAL_COMMUNITY): Payer: Self-pay

## 2023-04-24 ENCOUNTER — Encounter (HOSPITAL_COMMUNITY): Payer: Self-pay | Admitting: Family Medicine

## 2023-04-24 ENCOUNTER — Other Ambulatory Visit: Payer: Self-pay

## 2023-04-24 DIAGNOSIS — R569 Unspecified convulsions: Secondary | ICD-10-CM | POA: Diagnosis not present

## 2023-04-24 LAB — BASIC METABOLIC PANEL WITH GFR
Anion gap: 11 (ref 5–15)
BUN: 5 mg/dL — ABNORMAL LOW (ref 6–20)
CO2: 23 mmol/L (ref 22–32)
Calcium: 8.8 mg/dL — ABNORMAL LOW (ref 8.9–10.3)
Chloride: 98 mmol/L (ref 98–111)
Creatinine, Ser: 0.54 mg/dL — ABNORMAL LOW (ref 0.61–1.24)
GFR, Estimated: >60 mL/min (ref >60)
Glucose, Bld: 81 mg/dL (ref 70–99)
Potassium: 3.4 mmol/L — ABNORMAL LOW (ref 3.5–5.1)
Sodium: 132 mmol/L — ABNORMAL LOW (ref 135–145)

## 2023-04-24 LAB — CBC WITH DIFFERENTIAL/PLATELET
Abs Immature Granulocytes: 0.03 10*3/uL (ref 0.00–0.07)
Basophils Absolute: 0.1 10*3/uL (ref 0.0–0.1)
Basophils Relative: 1 %
Eosinophils Absolute: 0.3 10*3/uL (ref 0.0–0.5)
Eosinophils Relative: 4 %
HCT: 33.4 % — ABNORMAL LOW (ref 39.0–52.0)
Hemoglobin: 11.7 g/dL — ABNORMAL LOW (ref 13.0–17.0)
Immature Granulocytes: 0 %
Lymphocytes Relative: 19 %
Lymphs Abs: 1.5 10*3/uL (ref 0.7–4.0)
MCH: 34.6 pg — ABNORMAL HIGH (ref 26.0–34.0)
MCHC: 35 g/dL (ref 30.0–36.0)
MCV: 98.8 fL (ref 80.0–100.0)
Monocytes Absolute: 0.7 10*3/uL (ref 0.1–1.0)
Monocytes Relative: 9 %
Neutro Abs: 5.3 10*3/uL (ref 1.7–7.7)
Neutrophils Relative %: 67 %
Platelets: 366 10*3/uL (ref 150–400)
RBC: 3.38 MIL/uL — ABNORMAL LOW (ref 4.22–5.81)
RDW: 12.5 % (ref 11.5–15.5)
WBC: 7.9 10*3/uL (ref 4.0–10.5)
nRBC: 0 % (ref 0.0–0.2)

## 2023-04-24 MED ORDER — LAMOTRIGINE 100 MG PO TABS
100.0000 mg | ORAL_TABLET | Freq: Two times a day (BID) | ORAL | Status: DC
  Administered 2023-04-24: 100 mg via ORAL

## 2023-04-24 MED ORDER — LAMOTRIGINE 25 MG PO TABS
ORAL_TABLET | ORAL | 0 refills | Status: DC
  Filled 2023-04-24: qty 84, 28d supply, fill #0

## 2023-04-24 MED ORDER — LAMOTRIGINE 25 MG PO TABS: 50.0000 mg | ORAL_TABLET | Freq: Two times a day (BID) | ORAL | Status: DC

## 2023-04-24 MED ORDER — LAMOTRIGINE 100 MG PO TABS
100.0000 mg | ORAL_TABLET | Freq: Two times a day (BID) | ORAL | 1 refills | Status: DC
  Filled 2023-04-24 (×2): qty 60, 30d supply, fill #0

## 2023-04-24 MED ORDER — LAMOTRIGINE 100 MG PO TABS: 100.0000 mg | ORAL_TABLET | Freq: Two times a day (BID) | ORAL | Status: DC

## 2023-04-24 MED ORDER — LEVETIRACETAM 500 MG PO TABS
500.0000 mg | ORAL_TABLET | Freq: Two times a day (BID) | ORAL | Status: DC
  Administered 2023-04-24: 500 mg via ORAL
  Filled 2023-04-24: qty 1

## 2023-04-24 MED ORDER — LAMOTRIGINE 25 MG PO TABS: 50.0000 mg | ORAL_TABLET | Freq: Every day | ORAL | Status: DC

## 2023-04-24 MED ORDER — LEVETIRACETAM 500 MG PO TABS
500.0000 mg | ORAL_TABLET | Freq: Two times a day (BID) | ORAL | 0 refills | Status: DC
  Filled 2023-04-24: qty 60, 30d supply, fill #0

## 2023-04-24 NOTE — TOC CM/SW Note (Deleted)

## 2023-04-24 NOTE — Care Management Obs Status (Signed)
 MEDICARE OBSERVATION STATUS NOTIFICATION   Patient Details  Name: Billy Anderson MRN: [id number] Date of Birth: [birth year]   Medicare Observation Status Notification Given:  Yes    Diona Browner, LCSW 04/24/2023, 1:23 PM

## 2023-04-24 NOTE — Plan of Care (Addendum)
 No complaints of pain. Seizure precautions in place. LBM 4/2. No acute events overnight.  Problem: Education: Goal: Knowledge of General Education information will improve Description: Including pain rating scale, medication(s)/side effects and non-pharmacologic comfort measures Outcome: Progressing   Problem: Clinical Measurements: Goal: Ability to maintain clinical measurements within normal limits will improve Outcome: Progressing Goal: Will remain free from infection Outcome: Progressing   Problem: Safety: Goal: Ability to remain free from injury will improve Outcome: Progressing   Problem: Education: Goal: Expressions of having a comfortable level of knowledge regarding the disease process will increase Outcome: Progressing   Problem: Clinical Measurements: Goal: Complications related to the disease process, condition or treatment will be avoided or minimized Outcome: Progressing

## 2023-04-24 NOTE — Discharge Summary (Addendum)
 Physician Discharge Summary  Billy Anderson UUV:[id number] DOB: 03-10-1965 DOA: 04/23/2023  PCP: Billy Beers, FNP  Admit date: 04/23/2023 Discharge date: 04/24/2023  Time spent: 40 minutes  Recommendations for Outpatient Follow-up:  Follow outpatient CBC/CMP  Lamictal being uptitrated - on keppra in interim - follow with neurology outpatient Follow lower extremity wound outpatient   Discharge Diagnoses:  Principal Problem:   Seizure (HCC) Active Problems:   Alcohol use disorder, moderate, in early remission (HCC)   Essential hypertension   Hyponatremia   Psoriatic arthritis (HCC)   Paroxysmal atrial fibrillation (HCC)   Bipolar I disorder, most recent episode (or current) manic (HCC)   Tobacco use disorder   Hyperbilirubinemia   Discharge Condition: stable  Diet recommendation: heart healthy  Filed Weights   04/24/23 0005  Weight: 81.1 kg    History of present illness:   58 year old male with past medical history significant for chronic hyponatremia, alcohol abuse, bipolar disorder, seizures, psoriatic arthritis, HTN.  He was admitted 3/28 -4/4 with lower extremity cellulitis and wounds and hyponatremia presenting sodium 104.  Patient admitted to ICU, serial sodium measurements done.  IV fluid and tolvaptan x 2 given.  Sodium increased to 129.  During hospitalization the patient's Lamictal was held.    On the day of discharge he had Billy Anderson breakthrough seizure.  Lamictal was redosed.  On hospital day 1, transitioned to keppra and lamictal titration restarted.    Stable for discharge.  Plan for outpatient follow up.  See below for additional details.   Hospital Course:  Assessment and Plan:  Breakthrough Seizure Seizure Disorder Lamictal was held during last admission.  Appears >5 half lives. Will need reuptitration.  Start at 50 mg daily x2 weeks, then 50 mg twice daily x2 weeks, then 100 mg BID.  While uptitrating, start keppra (discussed plan for AED's with  neurology informally). Head CT without acute abnormality, mild cerebral atrophy Therapy recommending home health  Per [facility] statutes, patients with seizures are not allowed to drive until  they have been seizure-free for six months. Use caution when using heavy equipment or power tools. Avoid working on ladders or at heights. Take showers instead of baths. Ensure the water temperature is not too high on the home water heater. Do not go swimming alone. When caring for infants or small children, sit down when holding, feeding, or changing them to minimize risk of injury to the child in the event you have Billy Anderson seizure. Also, Maintain good sleep hygiene. Avoid alcohol.  Hyponatremia Continue on salt tabs Follow outpatient   History of Etoh Abuse Noted Encourage cessation  Lower Extremity Cellulitis and Wounds Continue wound care per prior hospital stay Healing well per my discussion with RN  Bipolar Zyprexa Lamictal  Hypertension Cardizem  Atrial Fibrillation Cardizem Only on aspirin  Psoriatic Arthritis noted    Procedures: none   Consultations: none  Discharge Exam: Vitals:   04/24/23 0811 04/24/23 0924  BP: (!) 157/78 (!) 157/78  Pulse: 64   Resp: 20   Temp: 98 F (36.7 C)   SpO2: 96%    No complaints Discussed with wife regarding discharge plans.  I tried to reassure that we'd adjusted medications, though she was obviously appropriately concerned regarding Billy Anderson.  After discharge entered, received message from RN that she was concerned he was being discharged too soon.  Asked RN to give me Billy Anderson call once she's here and sees him so we can talk additionally.   Called to f/u  no answer, will f/u with RN.  General: No acute distress. Cardiovascular: RRR Lungs: unlabored Abdomen: Soft, nontender, nondistended Neurological: Alert and oriented 3. Moves all extremities 4. Cranial nerves II through XII grossly intact. Extremities: dressing to  LLE  Discharge Instructions   Discharge Instructions     Call MD for:  difficulty breathing, headache or visual disturbances   Complete by: As directed    Call MD for:  extreme fatigue   Complete by: As directed    Call MD for:  hives   Complete by: As directed    Call MD for:  persistant dizziness or light-headedness   Complete by: As directed    Call MD for:  persistant nausea and vomiting   Complete by: As directed    Call MD for:  redness, tenderness, or signs of infection (pain, swelling, redness, odor or green/yellow discharge around incision site)   Complete by: As directed    Call MD for:  severe uncontrolled pain   Complete by: As directed    Call MD for:  temperature >100.4   Complete by: As directed    Diet - low sodium heart healthy   Complete by: As directed    Discharge instructions   Complete by: As directed    You were seen for Billy Anderson breakthrough seizure.  This occurred after your lamictal was held during the last hospitalization.  Since your lamictal was held over 5 days, we need to reuptitrate your lamictal dose again.  Start your lamictal at 50 mg daily.  Take this for 2 weeks.  After that, take 50 mg lamictal twice Billy Anderson day for 2 weeks.  Then you can transition back to your 100 mg twice Billy Anderson day lamictal.  While you're gradually increasing your lamictal, we'll have you on keppra.  Per [facility] statutes, patients with seizures are not allowed to drive until  they have been seizure-free for six months. Use caution when using heavy equipment or power tools. Avoid working on ladders or at heights. Take showers instead of baths. Ensure the water temperature is not too high on the home water heater. Do not go swimming alone. When caring for infants or small children, sit down when holding, feeding, or changing them to minimize risk of injury to the child in the event you have Billy Anderson seizure. Also, Maintain good sleep hygiene. Avoid alcohol.  Follow with your outpatient  neurologist so he can review your medications.  Continue your wound care at home.  Return for new, recurrent, or worsening symptoms.  Please ask your PCP to request records from this hospitalization so they know what was done and what the next steps will be.   Discharge wound care:   Complete by: As directed    Cleanse L lower leg/dorsal foot (intact skin and open) with Vashe wound cleanser,do not rinse and allow to air dry.  Apply Xeroform gauze to open wound beds, cover with ABD pad and wrap with Kerlix roll gauze beginning right above toes and ending right below knee.  May apply Ace bandage wrapped in same fashion as Kerlix for light compression.   Increase activity slowly   Complete by: As directed       Allergies as of 04/24/2023       Reactions   Ativan [lorazepam] Other (See Comments)   Confusion  Hallucinations    Roxicodone [oxycodone] Itching   Ultram [tramadol] Other (See Comments)   Seizures    Vistaril [hydroxyzine] Itching, Other (See Comments)  Sedation Not effective in reducing anxiety        Medication List     TAKE these medications    ALLEGRA PO Take 180 mg by mouth daily.   Aspirin Low Dose 81 MG tablet Generic drug: aspirin EC Take 1 tablet (81 mg total) by mouth daily. What changed: when to take this   CALCIUM 600+D3 PO Take 1 tablet by mouth 2 (two) times daily.   diltiazem 180 MG 24 hr capsule Commonly known as: CARDIZEM CD TAKE 2 CAPSULES BY MOUTH DAILY What changed:  how much to take when to take this   folic acid 1 MG tablet Commonly known as: FOLVITE Take 1 tablet (1 mg total) by mouth daily.   lamoTRIgine 25 MG tablet Commonly known as: LAMICTAL Take 2 tablets (50 mg total) by mouth daily for 14 days, THEN 2 tablets (50 mg total) 2 (two) times daily for 14 days. After finishing 50 mg twice Wilburta Milbourn day, transition back to your normal 100 mg twice Nisa Decaire day.  Take keppra while you're titrating to goal.. Start taking on: April 25, 2023 What  changed:  medication strength See the new instructions.   lamoTRIgine 100 MG tablet Commonly known as: LaMICtal Take 1 tablet (100 mg total) by mouth 2 (two) times daily. Start this after you've finished the 50 mg twice Link Burgeson day dose. Start taking on: May 22, 2023 What changed:  additional instructions These instructions start on May 22, 2023. If you are unsure what to do until then, ask your doctor or other care provider.   levETIRAcetam 500 MG tablet Commonly known as: KEPPRA Take 1 tablet (500 mg total) by mouth 2 (two) times daily. Take keppra until your lamictal has been titrated up to the goal dose.  After you're back to your normal lamictal dose, you can stop the keppra.   OLANZapine 20 MG tablet Commonly known as: ZYPREXA Take 1 tablet (20 mg total) by mouth at bedtime.   oxybutynin 10 MG 24 hr tablet Commonly known as: DITROPAN-XL TAKE 1 TABLET BY MOUTH AT BEDTIME   pantoprazole 40 MG tablet Commonly known as: PROTONIX Take 1 tablet (40 mg total) by mouth daily.   sodium chloride 1 g tablet Take 2 tablets (2 g total) by mouth 2 (two) times daily with Sade Hollon meal. What changed: when to take this   thiamine 100 MG tablet Commonly known as: VITAMIN B1 Take 1 tablet (100 mg total) by mouth daily.   tiZANidine 4 MG tablet Commonly known as: ZANAFLEX Take 4 mg by mouth daily as needed for muscle spasms.               Discharge Care Instructions  (From admission, onward)           Start     Ordered   04/24/23 0000  Discharge wound care:       Comments: Cleanse L lower leg/dorsal foot (intact skin and open) with Vashe wound cleanser,do not rinse and allow to air dry.  Apply Xeroform gauze to open wound beds, cover with ABD pad and wrap with Kerlix roll gauze beginning right above toes and ending right below knee.  May apply Ace bandage wrapped in same fashion as Kerlix for light compression.   04/24/23 1514           Allergies  Allergen Reactions   Ativan  [Lorazepam] Other (See Comments)    Confusion  Hallucinations    Roxicodone [Oxycodone] Itching   Ultram [Tramadol] Other (  See Comments)    Seizures    Vistaril [Hydroxyzine] Itching and Other (See Comments)    Sedation Not effective in reducing anxiety      The results of significant diagnostics from this hospitalization (including imaging, microbiology, ancillary and laboratory) are listed below for reference.    Significant Diagnostic Studies: CT HEAD WO CONTRAST ( ) Result Date: 04/23/2023 CLINICAL DATA:  Seizure.  Postictal state.  Headache. EXAM: CT HEAD WITHOUT CONTRAST TECHNIQUE: Contiguous axial images were obtained from the base of the skull through the vertex without intravenous contrast. RADIATION DOSE REDUCTION: This exam was performed according to the departmental dose-optimization program which includes automated exposure control, adjustment of the mA and/or kV according to patient size and/or use of iterative reconstruction technique. COMPARISON:  04/16/2023 FINDINGS: Brain: No evidence of intracranial hemorrhage, acute infarction, hydrocephalus, extra-axial collection, or mass lesion/mass effect. Mild diffuse cerebral atrophy, without significant change. Vascular:  No hyperdense vessel or other acute findings. Skull: No evidence of fracture or other significant bone abnormality. Sinuses/Orbits:  No acute findings. Other: None. IMPRESSION: No acute intracranial abnormality. Stable mild cerebral atrophy. Electronically Signed   By: Danae Orleans M.D.   On: 04/23/2023 19:28   ECHOCARDIOGRAM COMPLETE Result Date: 04/17/2023    ECHOCARDIOGRAM REPORT   Patient Name:   Billy Anderson Date of Exam: 04/17/2023 Medical Rec #:  [id number]        Height:       72.0 in Accession #:    [id number]       Weight:       193.1 lb Date of Birth:  [birth year]        BSA:          2.099 m Patient Age:    57 years         BP:           165/67 mmHg Patient Gender: M                HR:           66 bpm.  Exam Location:  Inpatient Procedure: 2D Echo, Cardiac Doppler and Color Doppler (Both Spectral and Color            Flow Doppler were utilized during procedure). Indications:    Cardiomyopathy  History:        Patient has no prior history of Echocardiogram examinations.                 Arrythmias:Atrial Fibrillation; Risk Factors:ETOH and                 Hypertension.  Sonographer:    Amy Chionchio Referring Phys: [id number] MURALI RAMASWAMY IMPRESSIONS  1. Left ventricular ejection fraction, by estimation, is 60 to 65%. The left ventricle has normal function. The left ventricle has no regional wall motion abnormalities. Left ventricular diastolic parameters were normal.  2. Right ventricular systolic function is normal. The right ventricular size is normal. There is normal pulmonary artery systolic pressure. The estimated right ventricular systolic pressure is 29.4 mmHg.  3. Left atrial size was severely dilated.  4. The mitral valve is normal in structure. No evidence of mitral valve regurgitation. No evidence of mitral stenosis.  5. The aortic valve is tricuspid. Aortic valve regurgitation is not visualized. Aortic valve sclerosis is present, with no evidence of aortic valve stenosis.  6. The inferior vena cava is normal in size with greater than 50% respiratory variability, suggesting right atrial pressure of 3 mmHg.  FINDINGS  Left Ventricle: Left ventricular ejection fraction, by estimation, is 60 to 65%. The left ventricle has normal function. The left ventricle has no regional wall motion abnormalities. The left ventricular internal cavity size was normal in size. There is  no left ventricular hypertrophy. Left ventricular diastolic parameters were normal. Right Ventricle: The right ventricular size is normal. No increase in right ventricular wall thickness. Right ventricular systolic function is normal. There is normal pulmonary artery systolic pressure. The tricuspid regurgitant velocity is 2.57 m/s, and  with an  assumed right atrial pressure of 3 mmHg, the estimated right ventricular systolic pressure is 29.4 mmHg. Left Atrium: Left atrial size was severely dilated. Right Atrium: Right atrial size was normal in size. Pericardium: There is no evidence of pericardial effusion. Mitral Valve: The mitral valve is normal in structure. No evidence of mitral valve regurgitation. No evidence of mitral valve stenosis. MV peak gradient, 4.4 mmHg. The mean mitral valve gradient is 2.0 mmHg. Tricuspid Valve: The tricuspid valve is normal in structure. Tricuspid valve regurgitation is trivial. No evidence of tricuspid stenosis. Aortic Valve: The aortic valve is tricuspid. Aortic valve regurgitation is not visualized. Aortic valve sclerosis is present, with no evidence of aortic valve stenosis. Aortic valve mean gradient measures 5.0 mmHg. Aortic valve peak gradient measures 10.4 mmHg. Aortic valve area, by VTI measures 2.73 cm. Pulmonic Valve: The pulmonic valve was not well visualized. Pulmonic valve regurgitation is not visualized. No evidence of pulmonic stenosis. Aorta: The aortic root and ascending aorta are structurally normal, with no evidence of dilitation. Venous: The inferior vena cava is normal in size with greater than 50% respiratory variability, suggesting right atrial pressure of 3 mmHg. IAS/Shunts: No atrial level shunt detected by color flow Doppler.  LEFT VENTRICLE PLAX 2D LVIDd:         5.10 cm      Diastology LVIDs:         3.70 cm      LV e' medial:    9.68 cm/s LV PW:         1.00 cm      LV E/e' medial:  9.8 LV IVS:        1.00 cm      LV e' lateral:   14.40 cm/s LVOT diam:     2.10 cm      LV E/e' lateral: 6.6 LV SV:         83 LV SV Index:   40 LVOT Area:     3.46 cm  LV Volumes (MOD) LV vol d, MOD A4C: 143.0 ml LV vol s, MOD A4C: 44.5 ml LV SV MOD A4C:     143.0 ml RIGHT VENTRICLE          IVC RV Basal diam:  3.70 cm  IVC diam: 1.90 cm RV Mid diam:    3.10 cm TAPSE (M-mode): 2.0 cm LEFT ATRIUM            Index         RIGHT ATRIUM           Index LA Vol (A4C): 106.0 ml 50.50 ml/m  RA Area:     15.40 cm                                     RA Volume:   37.20 ml  17.72 ml/m  AORTIC VALVE  PULMONIC VALVE AV Area (Vmax):    2.32 cm      PV Vmax:       1.06 m/s AV Area (Vmean):   2.49 cm      PV Peak grad:  4.5 mmHg AV Area (VTI):     2.73 cm AV Vmax:           161.00 cm/s AV Vmean:          100.000 cm/s AV VTI:            0.306 m AV Peak Grad:      10.4 mmHg AV Mean Grad:      5.0 mmHg LVOT Vmax:         108.00 cm/s LVOT Vmean:        71.900 cm/s LVOT VTI:          0.241 m LVOT/AV VTI ratio: 0.79  AORTA Ao Root diam: 3.20 cm Ao Asc diam:  3.20 cm MITRAL VALVE               TRICUSPID VALVE MV Area (PHT): 3.54 cm    TR Peak grad:   26.4 mmHg MV Area VTI:   2.65 cm    TR Vmax:        257.00 cm/s MV Peak grad:  4.4 mmHg MV Mean grad:  2.0 mmHg    SHUNTS MV Vmax:       1.05 m/s    Systemic VTI:  0.24 m MV Vmean:      61.6 cm/s   Systemic Diam: 2.10 cm MV Decel Time: 214 msec MV E velocity: 95.20 cm/s MV Harwood Nall velocity: 86.40 cm/s MV E/Donald Jacque ratio:  1.10 Mihai Croitoru MD Electronically signed by Thurmon Fair MD Signature Date/Time: 04/17/2023/1:12:12 PM    Final    US Abdomen Limited RUQ (LIVER/GB) Result Date: 04/17/2023 CLINICAL DATA:  90207 Alcoholic (HCC) 90207 EXAM: ULTRASOUND ABDOMEN LIMITED RIGHT UPPER QUADRANT COMPARISON:  None Available. FINDINGS: Gallbladder: No gallstones visualized. No sonographic Murphy sign noted by sonographer. There is wall prominence of the visualized gallbladder which may reflect summation of adjacent pericholecystic fat. Common bile duct: Diameter: Visualized portion measures 5 mm, within normal limits. Liver: No focal lesion identified within the limitations of the exam. Mildly heterogeneous in parenchymal echogenicity. Portal vein is patent on color Doppler imaging with normal direction of blood flow towards the liver. Other: None. IMPRESSION: 1. Mildly heterogeneous  hepatic parenchymal echogenicity. This is nonspecific but can be seen in the setting of underlying hepatocellular disease. 2. Gallbladder wall prominence is favored to reflect summation with adjacent pericholecystic fat. No definitive sonographic evidence of acute cholecystitis. Electronically Signed   By: Meda Klinefelter M.D.   On: 04/17/2023 12:22   CT HEAD WO CONTRAST ( ) Result Date: 04/16/2023 CLINICAL DATA:  Hypertension and altered mental status, initial encounter EXAM: CT HEAD WITHOUT CONTRAST TECHNIQUE: Contiguous axial images were obtained from the base of the skull through the vertex without intravenous contrast. RADIATION DOSE REDUCTION: This exam was performed according to the departmental dose-optimization program which includes automated exposure control, adjustment of the mA and/or kV according to patient size and/or use of iterative reconstruction technique. COMPARISON:  None Available. FINDINGS: Brain: No evidence of acute infarction, hemorrhage, hydrocephalus, extra-axial collection or mass lesion/mass effect. Vascular: No hyperdense vessel or unexpected calcification. Skull: Normal. Negative for fracture or focal lesion. Sinuses/Orbits: No acute finding. Other: None. IMPRESSION: No acute intracranial abnormality noted. Electronically Signed   By: Eulah Pont.D.  On: 04/16/2023 02:14   DG Chest 2 View Result Date: 04/15/2023 CLINICAL DATA:  Shortness of breath EXAM: CHEST - 2 VIEW COMPARISON:  Chest x-ray 01/19/2023 FINDINGS: The heart size and mediastinal contours are within normal limits. Both lungs are clear. The visualized skeletal structures are unremarkable. IMPRESSION: No active cardiopulmonary disease. Electronically Signed   By: Darliss Cheney M.D.   On: 04/15/2023 21:36    Microbiology: Recent Results (from the past 240 hours)  Blood Culture (routine x 2)     Status: None   Collection Time: 04/15/23  9:00 PM   Specimen: BLOOD  Result Value Ref Range Status    Specimen Description   Final    BLOOD LEFT ANTECUBITAL Performed at [facility], [address], [place], [place] [address]    Special Requests   Final    BOTTLES DRAWN AEROBIC AND ANAEROBIC Blood Culture results may not be optimal due to an inadequate volume of blood received in culture bottles Performed at [facility], [address] [place], [place] [address]    Culture   Final    NO GROWTH 5 DAYS Performed at [facility], [address], [place], [place] [address]    Report Status 04/20/2023 FINAL  Final  Blood Culture (routine x 2)     Status: None   Collection Time: 04/15/23  9:15 PM   Specimen: BLOOD RIGHT HAND  Result Value Ref Range Status   Specimen Description   Final    BLOOD RIGHT HAND Performed at [facility], [address] [place], [place] [address]    Special Requests   Final    BOTTLES DRAWN AEROBIC AND ANAEROBIC Blood Culture results may not be optimal due to an inadequate volume of blood received in culture bottles Performed at [facility], [address] [place], [place] [address]    Culture   Final    NO GROWTH 5 DAYS Performed at [facility], [address], [place], [place] [address]    Report Status 04/20/2023 FINAL  Final  MRSA Next Gen by PCR, Nasal     Status: None   Collection Time: 04/16/23  3:55 AM   Specimen: Nasal Mucosa; Nasal Swab  Result Value Ref Range Status   MRSA by PCR Next Gen NOT DETECTED NOT DETECTED Final    Comment: (NOTE) The GeneXpert MRSA Assay (FDA approved for NASAL specimens only), is one component of [facility]mina Ribaud[facility] comprehensive MRSA colonization surveillance program. It is not intended to diagnose MRSA infection nor to guide or monitor treatment for MRSA infections. Test performance is not FDA approved in patients less than 38 years old. Performed at [facility], [address] [place], [place] [address]      Labs: Basic  Metabolic Panel: Recent Labs  Lab 04/21/23 0540 04/21/23 1133 04/22/23 0548 04/22/23 1414 04/22/23 2200 04/23/23 0834 04/23/23 1802 04/24/23 0552  NA 125*   < > 126* 125* 128* 129* 129* 132*  K 3.7  --  3.5  --   --  3.6 3.5 3.4*  CL 91*  --  92*  --   --  96* 94* 98  CO2 24  --  22  --   --  22 18* 23  GLUCOSE 100*  --  87  --   --  82 133* 81  BUN 6  --  6  --   --  <5* 7 5*  CREATININE 0.46*  --  0.43*  --   --  0.49* 0.60* 0.54*  CALCIUM 8.8*  --  8.5*  --   --  8.6* 9.1 8.8*  MG  --   --  1.6*  --   --   --   --   --    < > = values in this interval not displayed.   Liver Function Tests: Recent Labs  Lab 04/22/23 0548 04/23/23 1802  AST 31 33  ALT 14 15  ALKPHOS 71 84  BILITOT 0.7 0.7  PROT 6.7 7.2  ALBUMIN 3.1* 3.8   Recent Labs  Lab 04/23/23 1802  LIPASE 24   No results for input(s): "AMMONIA" in the last 168 hours. CBC: Recent Labs  Lab 04/22/23 0548 04/23/23 1802 04/24/23 0552  WBC 7.9 10.0 7.9  NEUTROABS 4.6 6.3 5.3  HGB 12.9* 14.2 11.7*  HCT 37.3* 39.7 33.4*  MCV 98.4 94.5 98.8  PLT 346 446* 366   Cardiac Enzymes: No results for input(s): "CKTOTAL", "CKMB", "CKMBINDEX", "TROPONINI" in the last 168 hours. BNP: BNP (last 3 results) Recent Labs    01/19/23 0243 04/15/23 2105  BNP 82.6 185.3*    ProBNP (last 3 results) No results for input(s): "PROBNP" in the last 8760 hours.  CBG: Recent Labs  Lab 04/23/23 1753  GLUCAP 148*       Signed:  Lacretia Nicks MD.  [facility] 04/24/2023, 3:15 PM

## 2023-04-24 NOTE — Evaluation (Signed)
 Physical Therapy Evaluation Patient Details Name: Billy Anderson MRN: [id number] DOB: 01/31/1965 Today's Date: 04/24/2023  History of Present Illness  Pt with recent admission 3/28 -4/4 with lower extremity cellulitis and wounds and hyponatremia presenting sodium 104.  Patient admitted to ICU, serial sodium measurements done.  IV fluid and tolvaptan x 2 given.  Sodium increased to 129.  During hospitalization the patient's Lamictal was held.  Lamictal resumed, dose given earlier 4/4 and patient discharged.  While being discharged the patient had a tonic-clonic seizure.  Overnight admission requested.  No further seizure activity since patient in the ER. PMH: hyponatremia, seizures, bipolar, dysrythmia, Pneumonia, ACDF  Clinical Impression  Pt admitted with above diagnosis.  Pt currently with functional limitations due to the deficits listed below (see PT Problem List). Pt will benefit from acute skilled PT to increase their independence and safety with mobility to allow discharge.  Pt discharged and readmitted same day with seizure.  Pt able to ambulate in hallway and reports likely d/c home today.  Pt set up with HHPT upon recent discharge which remains appropriate.         If plan is discharge home, recommend the following: A little help with walking and/or transfers;A little help with bathing/dressing/bathroom;Assistance with cooking/housework;Assist for transportation;Help with stairs or ramp for entrance   Can travel by private vehicle        Equipment Recommendations None recommended by PT  Recommendations for Other Services       Functional Status Assessment Patient has had a recent decline in their functional status and demonstrates the ability to make significant improvements in function in a reasonable and predictable amount of time.     Precautions / Restrictions Precautions Precautions: Fall Precaution/Restrictions Comments: hx seizures      Mobility  Bed  Mobility Overal bed mobility: Needs Assistance Bed Mobility: Supine to Sit, Sit to Supine     Supine to sit: Supervision, HOB elevated Sit to supine: Supervision, HOB elevated   General bed mobility comments: increased time    Transfers Overall transfer level: Needs assistance Equipment used: Rolling walker (2 wheels) Transfers: Sit to/from Stand Sit to Stand: Supervision, Contact guard assist           General transfer comment: verbal cues for hand placement    Ambulation/Gait Ambulation/Gait assistance: Contact guard assist, Supervision Gait Distance (Feet): 180 Feet Assistive device: Rolling walker (2 wheels) Gait Pattern/deviations: Step-through pattern, Decreased stride length, Trunk flexed Gait velocity: decreased     General Gait Details: verbal cue for posture (tends to keep trunk forward flexed); also observed bil toe curls during weight bearing; no overt LOB; reports foot pain limiting distance today (prefers shoes but wearing hospital socks)  Stairs            Wheelchair Mobility     Tilt Bed    Modified Rankin (Stroke Patients Only)       Balance                                             Pertinent Vitals/Pain Pain Assessment Pain Assessment: Faces Faces Pain Scale: Hurts even more Pain Location: bilat feet with WB (only wearing hospital socks, states his shoes would have been preferrable for ambulation while ambulating) Pain Descriptors / Indicators: Sore, Tender Pain Intervention(s): Repositioned, Monitored during session    Home Living Family/patient expects to be discharged  to:: Private residence Living Arrangements: Spouse/significant other Available Help at Discharge: Family;Available PRN/intermittently Type of Home: House Home Access: Stairs to enter Entrance Stairs-Rails: Doctor, general practice of Steps: 5   Home Layout: One level Home Equipment: Agricultural consultant (2 wheels);Rollator (4  wheels);Cane - single point;Shower seat      Prior Function Prior Level of Function : Independent/Modified Independent             Mobility Comments: using rollator ADLs Comments: Assist of family as needed     Extremity/Trunk Assessment        Lower Extremity Assessment Lower Extremity Assessment: Generalized weakness    Cervical / Trunk Assessment Cervical / Trunk Assessment: Other exceptions Cervical / Trunk Exceptions: forward flexed posture but able to correct with upright cues  Communication   Communication Communication: Impaired Factors Affecting Communication: Hearing impaired    Cognition Arousal: Alert Behavior During Therapy: Flat affect   PT - Cognitive impairments: No family/caregiver present to determine baseline                       PT - Cognition Comments: flat but appropriate Following commands: Intact       Cueing       General Comments      Exercises     Assessment/Plan    PT Assessment Patient needs continued PT services  PT Problem List Decreased strength;Decreased activity tolerance;Decreased balance;Decreased mobility;Decreased knowledge of use of DME;Pain       PT Treatment Interventions DME instruction;Gait training;Stair training;Functional mobility training;Therapeutic activities;Therapeutic exercise;Balance training;Patient/family education    PT Goals (Current goals can be found in the Care Plan section)  Acute Rehab PT Goals PT Goal Formulation: With patient Time For Goal Achievement: 05/01/23 Potential to Achieve Goals: Good    Frequency Min 2X/week     Co-evaluation               AM-PAC PT "6 Clicks" Mobility  Outcome Measure Help needed turning from your back to your side while in a flat bed without using bedrails?: None Help needed moving from lying on your back to sitting on the side of a flat bed without using bedrails?: None Help needed moving to and from a bed to a chair (including a  wheelchair)?: A Little Help needed standing up from a chair using your arms (e.g., wheelchair or bedside chair)?: A Little Help needed to walk in hospital room?: A Little Help needed climbing 3-5 steps with a railing? : A Little 6 Click Score: 20    End of Session Equipment Utilized During Treatment: Gait belt Activity Tolerance: Patient tolerated treatment well Patient left: in bed;with call bell/phone within reach;with bed alarm set Nurse Communication: Mobility status PT Visit Diagnosis: Difficulty in walking, not elsewhere classified (R26.2)    Time: 1610-9604 PT Time Calculation (min) (ACUTE ONLY): 14 min   Charges:   PT Evaluation $PT Eval Low Complexity: 1 Low   PT General Charges $$ ACUTE PT VISIT: 1 Visit        Thomasene Mohair PT, DPT Physical Therapist Acute Rehabilitation Services Office: [phone]   Kati L Payson 04/24/2023, 12:30 PM

## 2023-04-26 ENCOUNTER — Other Ambulatory Visit: Payer: Self-pay | Admitting: Nurse Practitioner

## 2023-04-26 ENCOUNTER — Telehealth: Payer: Self-pay

## 2023-04-26 DIAGNOSIS — L03115 Cellulitis of right lower limb: Secondary | ICD-10-CM

## 2023-04-26 DIAGNOSIS — Z8701 Personal history of pneumonia (recurrent): Secondary | ICD-10-CM | POA: Diagnosis not present

## 2023-04-26 DIAGNOSIS — R569 Unspecified convulsions: Secondary | ICD-10-CM

## 2023-04-26 DIAGNOSIS — Z7409 Other reduced mobility: Secondary | ICD-10-CM

## 2023-04-26 DIAGNOSIS — L03116 Cellulitis of left lower limb: Secondary | ICD-10-CM | POA: Diagnosis not present

## 2023-04-26 DIAGNOSIS — I480 Paroxysmal atrial fibrillation: Secondary | ICD-10-CM | POA: Diagnosis not present

## 2023-04-26 DIAGNOSIS — I119 Hypertensive heart disease without heart failure: Secondary | ICD-10-CM | POA: Diagnosis not present

## 2023-04-26 DIAGNOSIS — F17210 Nicotine dependence, cigarettes, uncomplicated: Secondary | ICD-10-CM | POA: Diagnosis not present

## 2023-04-26 DIAGNOSIS — E871 Hypo-osmolality and hyponatremia: Secondary | ICD-10-CM | POA: Diagnosis not present

## 2023-04-26 DIAGNOSIS — L4050 Arthropathic psoriasis, unspecified: Secondary | ICD-10-CM | POA: Diagnosis not present

## 2023-04-26 DIAGNOSIS — G40919 Epilepsy, unspecified, intractable, without status epilepticus: Secondary | ICD-10-CM | POA: Diagnosis not present

## 2023-04-26 DIAGNOSIS — F319 Bipolar disorder, unspecified: Secondary | ICD-10-CM | POA: Diagnosis not present

## 2023-04-26 LAB — LAMOTRIGINE LEVEL: Lamotrigine Lvl: <1 ug/mL — ABNORMAL LOW (ref 2.0–20.0)

## 2023-04-26 NOTE — Telephone Encounter (Signed)
 Copied from CRM [id number]. Topic: Clinical - Home Health Verbal Orders >> Apr 26, 2023  1:44 PM Sasha H wrote: Caller/Agency: [facility] Callback Number: [phone] Service Requested: Physical Therapy Frequency: 1x for 8 weeks for PT Any new concerns about the patient? Yes , pt was in hospital , received referral, was discharged Phy Therapy seen pt today in home. PT had fall on 4/5, abrasion on left forearm, chronic cellulitis (unclear which dr is addressing this). Referral for home health nurse is needed for skin issues, disease and med management. OT eval also needed for ADL's. Pt would also benefit with a referral to Neurology. Med are being taken differently from how it is listed on discharge papers, Pt has chronic back pain with no meds addressing it.

## 2023-04-27 NOTE — Telephone Encounter (Signed)
 Approval was advised [facility]

## 2023-04-28 DIAGNOSIS — G40919 Epilepsy, unspecified, intractable, without status epilepticus: Secondary | ICD-10-CM | POA: Diagnosis not present

## 2023-04-28 DIAGNOSIS — L4050 Arthropathic psoriasis, unspecified: Secondary | ICD-10-CM | POA: Diagnosis not present

## 2023-04-28 DIAGNOSIS — E871 Hypo-osmolality and hyponatremia: Secondary | ICD-10-CM | POA: Diagnosis not present

## 2023-04-28 DIAGNOSIS — Z8701 Personal history of pneumonia (recurrent): Secondary | ICD-10-CM | POA: Diagnosis not present

## 2023-04-28 DIAGNOSIS — F319 Bipolar disorder, unspecified: Secondary | ICD-10-CM | POA: Diagnosis not present

## 2023-04-28 DIAGNOSIS — I480 Paroxysmal atrial fibrillation: Secondary | ICD-10-CM | POA: Diagnosis not present

## 2023-04-28 DIAGNOSIS — L03116 Cellulitis of left lower limb: Secondary | ICD-10-CM | POA: Diagnosis not present

## 2023-04-28 DIAGNOSIS — I119 Hypertensive heart disease without heart failure: Secondary | ICD-10-CM | POA: Diagnosis not present

## 2023-04-28 DIAGNOSIS — F17210 Nicotine dependence, cigarettes, uncomplicated: Secondary | ICD-10-CM | POA: Diagnosis not present

## 2023-04-29 ENCOUNTER — Telehealth: Payer: Self-pay

## 2023-04-29 NOTE — Telephone Encounter (Signed)
 Copied from CRM [id number]. Topic: Clinical - Home Health Verbal Orders >> Apr 29, 2023 11:28 AM Eunice Blase wrote: Caller/Agency: Karlyn Agee per Zollie Pee Number: [phone] Service Requested: Skilled Nursing wound care Frequency: 2x a week Any new concerns about the patient? No  Advised of ok [facility]

## 2023-05-03 NOTE — Telephone Encounter (Signed)
 Lvm advise ing of ok. [facility]

## 2023-05-04 DIAGNOSIS — F17210 Nicotine dependence, cigarettes, uncomplicated: Secondary | ICD-10-CM | POA: Diagnosis not present

## 2023-05-04 DIAGNOSIS — G40919 Epilepsy, unspecified, intractable, without status epilepticus: Secondary | ICD-10-CM | POA: Diagnosis not present

## 2023-05-04 DIAGNOSIS — L4050 Arthropathic psoriasis, unspecified: Secondary | ICD-10-CM | POA: Diagnosis not present

## 2023-05-04 DIAGNOSIS — L03116 Cellulitis of left lower limb: Secondary | ICD-10-CM | POA: Diagnosis not present

## 2023-05-04 DIAGNOSIS — I480 Paroxysmal atrial fibrillation: Secondary | ICD-10-CM | POA: Diagnosis not present

## 2023-05-04 DIAGNOSIS — Z8701 Personal history of pneumonia (recurrent): Secondary | ICD-10-CM | POA: Diagnosis not present

## 2023-05-04 DIAGNOSIS — I119 Hypertensive heart disease without heart failure: Secondary | ICD-10-CM | POA: Diagnosis not present

## 2023-05-04 DIAGNOSIS — E871 Hypo-osmolality and hyponatremia: Secondary | ICD-10-CM | POA: Diagnosis not present

## 2023-05-04 DIAGNOSIS — F319 Bipolar disorder, unspecified: Secondary | ICD-10-CM | POA: Diagnosis not present

## 2023-05-07 ENCOUNTER — Encounter (HOSPITAL_COMMUNITY): Payer: Self-pay | Admitting: Psychiatry

## 2023-05-07 ENCOUNTER — Telehealth (HOSPITAL_BASED_OUTPATIENT_CLINIC_OR_DEPARTMENT_OTHER): Payer: Self-pay | Admitting: Psychiatry

## 2023-05-07 VITALS — Wt 180.0 lb

## 2023-05-07 DIAGNOSIS — F1021 Alcohol dependence, in remission: Secondary | ICD-10-CM

## 2023-05-07 DIAGNOSIS — F319 Bipolar disorder, unspecified: Secondary | ICD-10-CM

## 2023-05-07 MED ORDER — NALTREXONE HCL 50 MG PO TABS: 50.0000 mg | ORAL_TABLET | Freq: Every day | ORAL | 1 refills | Status: DC

## 2023-05-07 MED ORDER — OLANZAPINE 20 MG PO TABS: 20.0000 mg | ORAL_TABLET | Freq: Every day | ORAL | 1 refills | Status: DC

## 2023-05-07 NOTE — Progress Notes (Signed)
 [facility] MD Virtual Progress Note   Patient Location: Home Provider Location: Home Office  I connect with patient by video and verified that I am speaking with correct person by using two identifiers. I discussed the limitations of evaluation and management by telemedicine and the availability of in person appointments. I also discussed with the patient that there may be a patient responsible charge related to this service. The patient expressed understanding and agreed to proceed.  Billy Anderson [id number] 58 y.o.  05/07/2023 9:27 AM  History of Present Illness:  Patient is evaluated by video session.  He was last seen October 2024.  He apologized did not make a follow-up appointment but then he also admitted twice because of medical reason.  Initially he had cellulitis and then he had low sodium and seizures.  During the hospitalization his medicines were discontinued but he is back on olanzapine .  He is not taking Celexa .  He admitted relapse into drinking and had a drink beer 2 weeks ago.  He denies any mania or psychosis but at times irritability, frustration.  His last sodium level 132.  He is restricted from driving.  He denies any crying spells or any feeling of hopelessness or worthlessness.  He is not taking naltrexone  but like to go back on it.  He lives with his wife who is supportive and takes him to the doctor's appointment.  He has no tremor or shakes or any EPS.  He lost weight as did not like hospital food but now he is building up his appetite.  He like to keep his olanzapine  and want to go back on naltrexone .  I explained Celexa  may be discontinued due to low sodium.  He is now on Keppra  and Lamictal .  He is scheduled to see neurology but appointment is in October 2025.  However he is going to see his primary care very soon.  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  Started drinking at age 58. Multiple rehab for  alcohol addiction. H/O low sodium and seizure. Tried lithium, Seroquel, Vraylar with poor outcome.  Given Celexa  by PCP but stopped.  No history of suicidal attempt.  History of mania with excessive spending and grandiosity.  No history of abuse.     Outpatient Encounter Medications as of 05/07/2023  Medication Sig   aspirin  EC 81 MG tablet Take 1 tablet (81 mg total) by mouth daily. (Patient taking differently: Take 81 mg by mouth at bedtime.)   Calcium  Carb-Cholecalciferol  (CALCIUM  600+D3 PO) Take 1 tablet by mouth 2 (two) times daily.   diltiazem  (CARDIZEM  CD) 180 MG 24 hr capsule TAKE 2 CAPSULES BY MOUTH DAILY (Patient taking differently: Take 180 mg by mouth in the morning and at bedtime.)   Fexofenadine HCl (ALLEGRA PO) Take 180 mg by mouth daily.   folic acid  (FOLVITE ) 1 MG tablet Take 1 tablet (1 mg total) by mouth daily.   [START ON 05/22/2023] lamoTRIgine  (LAMICTAL ) 100 MG tablet Take 1 tablet (100 mg total) by mouth 2 (two) times daily. Start this after you've finished the 50 mg twice a day dose.   lamoTRIgine  (LAMICTAL ) 25 MG tablet Take 2 tablets (50 mg total) by mouth daily for 14 days, THEN 2 tablets (50 mg total) 2 (two) times daily for 14 days. After finishing 50 mg twice a day, transition back to your normal 100 mg twice a day.  Take keppra  while you're titrating to goal..   levETIRAcetam  (KEPPRA ) 500  MG tablet Take 1 tablet (500 mg total) by mouth 2 (two) times daily. Take keppra  until your lamictal  has been titrated up to the goal dose.  After you're back to your normal lamictal  dose, you can stop the keppra .   OLANZapine  (ZYPREXA ) 20 MG tablet Take 1 tablet (20 mg total) by mouth at bedtime.   oxybutynin  (DITROPAN -XL) 10 MG 24 hr tablet TAKE 1 TABLET BY MOUTH AT BEDTIME   pantoprazole  (PROTONIX ) 40 MG tablet Take 1 tablet (40 mg total) by mouth daily.   sodium chloride  1 g tablet Take 2 tablets (2 g total) by mouth 2 (two) times daily with a meal. (Patient taking differently: Take 2  g by mouth 3 (three) times daily with meals.)   thiamine  (VITAMIN B-1) 100 MG tablet Take 1 tablet (100 mg total) by mouth daily.   tiZANidine  (ZANAFLEX ) 4 MG tablet Take 4 mg by mouth daily as needed for muscle spasms.   No facility-administered encounter medications on file as of 05/07/2023.   Comprehensive metabolic panel     Status: Abnormal   Collection Time: 04/15/23  9:05 PM  Result Value Ref Range   Sodium 108 (LL) 135 - 145 mmol/L    Comment: CRITICAL RESULT CALLED TO, READ BACK BY AND VERIFIED WITH JACKS, R RN @ 2200 ON 04/15/2023 BY MTA    Potassium 4.6 3.5 - 5.1 mmol/L   Chloride 72 (L) 98 - 111 mmol/L   CO2 23 22 - 32 mmol/L   Glucose, Bld 80 70 - 99 mg/dL    Comment: Glucose reference range applies only to samples taken after fasting for at least 8 hours.   BUN <5 (L) 6 - 20 mg/dL   Creatinine, Ser 6.57 (L) 0.61 - 1.24 mg/dL   Calcium  8.5 (L) 8.9 - 10.3 mg/dL   Total Protein 6.6 6.5 - 8.1 g/dL   Albumin 3.5 3.5 - 5.0 g/dL   AST 96 (H) 15 - 41 U/L   ALT 41 0 - 44 U/L   Alkaline Phosphatase 89 38 - 126 U/L   Total Bilirubin 1.3 (H) 0.0 - 1.2 mg/dL   GFR, Estimated >84 >69 mL/min    Comment: (NOTE) Calculated using the CKD-EPI Creatinine Equation (2021)    Anion gap 13 5 - 15    Comment: Performed at [facility], [address], [place], [place] [address]  CBC with Differential     Status: Abnormal   Collection Time: 04/15/23  9:05 PM  Result Value Ref Range   WBC 10.4 4.0 - 10.5 K/uL   RBC 3.85 (L) 4.22 - 5.81 MIL/uL   Hemoglobin 13.0 13.0 - 17.0 g/dL   HCT 84.1 (L) 32.4 - 40.1 %   MCV 91.4 80.0 - 100.0 fL   MCH 33.8 26.0 - 34.0 pg   MCHC 36.9 (H) 30.0 - 36.0 g/dL   RDW 02.7 25.3 - 66.4 %   Platelets 303 150 - 400 K/uL   nRBC 0.0 0.0 - 0.2 %   Neutrophils Relative % 84 %   Neutro Abs 8.8 (H) 1.7 - 7.7 K/uL   Lymphocytes Relative 7 %   Lymphs Abs 0.8 0.7 - 4.0 K/uL   Monocytes Relative 7 %   Monocytes Absolute 0.7 0.1 - 1.0 K/uL    Eosinophils Relative 1 %   Eosinophils Absolute 0.1 0.0 - 0.5 K/uL   Basophils Relative 0 %   Basophils Absolute 0.0 0.0 - 0.1 K/uL   Immature Granulocytes 1 %  Abs Immature Granulocytes 0.06 0.00 - 0.07 K/uL    Comment: Performed at [facility], [address] [place], [place] [address]  Protime-INR     Status: None   Collection Time: 04/15/23  9:05 PM  Result Value Ref Range   Prothrombin Time 13.2 11.4 - 15.2 seconds   INR 1.0 0.8 - 1.2    Comment: (NOTE) INR goal varies based on device and disease states. Performed at [facility], [address], [place], [place] [address]   APTT     Status: Abnormal   Collection Time: 04/15/23  9:05 PM  Result Value Ref Range   aPTT 43 (H) 24 - 36 seconds    Comment:        IF BASELINE aPTT IS ELEVATED, SUGGEST PATIENT RISK ASSESSMENT BE USED TO DETERMINE APPROPRIATE ANTICOAGULANT THERAPY. Performed at [facility], [address] [place], [place] [address]   Brain natriuretic peptide     Status: Abnormal   Collection Time: 04/15/23  9:05 PM  Result Value Ref Range   B Natriuretic Peptide 185.3 (H) 0.0 - 100.0 pg/mL    Comment: Performed at [facility], [address] [place], [place] [address]  Troponin I (High Sensitivity)     Status: None   Collection Time: 04/15/23  9:05 PM  Result Value Ref Range   Troponin I (High Sensitivity) 12 <18 ng/L    Comment: (NOTE) Elevated high sensitivity troponin I (hsTnI) values and significant  changes across serial measurements may suggest ACS but many other  chronic and acute conditions are known to elevate hsTnI results.  Refer to the "Links" section for chest pain algorithms and additional  guidance. Performed at [facility], [address] [place], [place] [address]   Ethanol     Status: None   Collection Time: 04/15/23  9:05 PM  Result Value Ref Range   Alcohol, Ethyl (B) <10 <10 mg/dL     Comment: (NOTE) Lowest detectable limit for serum alcohol is 10 mg/dL.  For medical purposes only. Performed at [facility], [address], [place], [place] [address]   CK     Status: Abnormal   Collection Time: 04/15/23  9:05 PM  Result Value Ref Range   Total CK 1,709 (H) 49 - 397 U/L    Comment: Performed at [facility], [address] [place], [place] [address]                                              I-Stat Lactic Acid, ED     Status: None   Collection Time: 04/15/23  9:16 PM  Result Value Ref Range   Lactic Acid, Venous 1.0 0.5 - 1.9 mmol/L  Troponin I (High Sensitivity)     Status: None   Collection Time: 04/15/23 10:55 PM  Result Value Ref Range   Troponin I (High Sensitivity) 13 <18 ng/L    Comment: (NOTE) Elevated high sensitivity troponin I (hsTnI) values and significant  changes across serial measurements may suggest ACS but many other  chronic and acute conditions are known to elevate hsTnI results.  Refer to the "Links" section for chest pain algorithms and additional  guidance. Performed at [facility], [address] [place], [place] [address]   Lamotrigine  level  Status: Abnormal   Collection Time: 04/15/23 10:55 PM  Result Value Ref Range   Lamotrigine  Lvl 1.9 (L) 2.0 - 20.0 ug/mL    Comment: (NOTE)                                Detection Limit = 1.0 Performed At: [facility] [address] [place], [place] [id number] Pearlean Botts MD QQ:[id number]   Osmolality     Status: Abnormal   Collection Time: 04/15/23 10:55 PM  Result Value Ref Range   Osmolality 225 (LL) 275 - 295 mOsm/kg    Comment: REPEATED TO VERIFY CRITICAL RESULT CALLED TO, READ BACK BY AND VERIFIED WITH: LACIVITA,H RN 04/16/2023 AT 0038 SKEEN,P Performed at [facility], [address], [place], [place] [address]   POC CBG, ED     Status: None   Collection Time: 04/15/23 10:59 PM   Result Value Ref Range   Glucose-Capillary 77 70 - 99 mg/dL    Comment: Glucose reference range applies only to samples taken after fasting for at least 8 hours.  I-Stat Lactic Acid, ED     Status: Abnormal   Collection Time: 04/15/23 11:07 PM  Result Value Ref Range   Lactic Acid, Venous 0.4 (L) 0.5 - 1.9 mmol/L  Urinalysis, Routine w reflex microscopic -Urine, Clean Catch     Status: Abnormal   Collection Time: 04/15/23 11:45 PM  Result Value Ref Range   Color, Urine YELLOW YELLOW   APPearance HAZY (A) CLEAR   Specific Gravity, Urine 1.010 1.005 - 1.030   pH 6.0 5.0 - 8.0   Glucose, UA NEGATIVE NEGATIVE mg/dL   Hgb urine dipstick NEGATIVE NEGATIVE   Bilirubin Urine NEGATIVE NEGATIVE   Ketones, ur 80 (A) NEGATIVE mg/dL   Protein, ur NEGATIVE NEGATIVE mg/dL   Nitrite NEGATIVE NEGATIVE   Leukocytes,Ua NEGATIVE NEGATIVE    Comment: Performed at [facility], [address] [place], [place] [address]  Sodium, urine, random     Status: None   Collection Time: 04/15/23 11:45 PM  Result Value Ref Range   Sodium, Ur 37 mmol/L    Comment: Performed at [facility], [address] [place], [place] [address]  Osmolality, urine     Status: None   Collection Time: 04/15/23 11:45 PM  Result Value Ref Range   Osmolality, Ur 317 300 - 900 mOsm/kg    Comment: Performed at [facility], [address] [place], [place] [address]  Basic metabolic panel     Status: Abnormal   Collection Time: 04/16/23  2:35 AM  Result Value Ref Range   Sodium 109 (LL) 135 - 145 mmol/L    Comment: CRITICAL RESULT CALLED TO, READ BACK BY AND VERIFIED WITH IVKOV RN @ [phone] ON 04/16/2023 BY MTA    Potassium 3.5 3.5 - 5.1 mmol/L    Comment: Billy CHECK NOTED   Chloride 76 (L) 98 - 111 mmol/L   CO2 23 22 - 32 mmol/L   Glucose, Bld 128 (H) 70 - 99 mg/dL    Comment: Glucose reference range applies only to samples taken after fasting for at least 8 hours.   BUN <5 (L) 6  - 20 mg/dL   Creatinine, Ser 3.55 (L) 0.61 - 1.24 mg/dL   Calcium  7.6 (L) 8.9 - 10.3 mg/dL   GFR, Estimated >73 >22 mL/min    Comment: (NOTE) Calculated using the CKD-EPI  Creatinine Equation (2021)    Anion gap 10 5 - 15    Comment: Performed at [facility], [address] [place], [place] [address]  MRSA Next Gen by PCR, Nasal     Status: None                                                   WBC 10.8 (H) 4.0 - 10.5 K/uL   RBC 3.92 (L) 4.22 - 5.81 MIL/uL   Hemoglobin 13.3 13.0 - 17.0 g/dL   HCT 60.4 (L) 54.0 - 98.1 %   MCV 92.1 80.0 - 100.0 fL   MCH 33.9 26.0 - 34.0 pg   MCHC 36.8 (H) 30.0 - 36.0 g/dL   RDW 19.1 47.8 - 29.5 %   Platelets 288 150 - 400 K/uL   nRBC 0.0 0.0 - 0.2 %    Comment: Performed at [facility], [address], [place], [place] [address]  Basic metabolic panel     Status: Abnormal   Collection Time: 04/16/23  5:24 AM  Result Value Ref Range   Sodium 109 (LL) 135 - 145 mmol/L    Comment: CRITICAL RESULT CALLED TO, READ BACK BY AND VERIFIED WITH Stewart Elk, M RN @ [phone] ON 04/16/23 CAL    Potassium 4.0 3.5 - 5.1 mmol/L   Chloride 74 (L) 98 - 111 mmol/L   CO2 22 22 - 32 mmol/L   Glucose, Bld 93 70 - 99 mg/dL    Comment: Glucose reference range applies only to samples taken after fasting for at least 8 hours.   BUN <5 (L) 6 - 20 mg/dL   Creatinine, Ser 8.46 (L) 0.61 - 1.24 mg/dL   Calcium  8.2 (L) 8.9 - 10.3 mg/dL   GFR, Estimated >96 >29 mL/min    Comment: (NOTE) Calculated using the CKD-EPI Creatinine Equation (2021)    Anion gap 13 5 - 15    Comment: Performed at [facility], [address], [place], [place] [address]  Magnesium      Status: None   Collection Time: 04/16/23  5:24 AM  Result Value Ref Range   Magnesium  1.8 1.7 - 2.4 mg/dL    Comment: Performed at [facility], [address], [place], [place] [address]  Osmolality, urine     Status: None    Collection Time: 04/16/23  6:46 AM  Result Value Ref Range   Osmolality, Ur 311 300 - 900 mOsm/kg    Comment: Performed at [facility], [address] [place], [place] [address]  Sodium, urine, random     Status: None   Collection Time: 04/16/23  6:46 AM  Result Value Ref Range   Sodium, Ur 62 mmol/L    Comment: Performed at [facility], [address] [place], [place] [address]  Sodium     Status: Abnormal   Collection Time: 04/16/23  8:42 AM  Result Value Ref Range   Sodium 108 (LL) 135 - 145 mmol/L    Comment: CRITICAL RESULT CALLED TO, READ BACK BY AND VERIFIED WITH WEST, A RN @ 1022 ON 04/16/23 CAL Performed at [facility], [address], [place], [place] [address]   Sodium     Status: Abnormal   Collection Time: 04/16/23 12:02 PM  Result Value Ref Range  Sodium 109 (LL) 135 - 145 mmol/L    Comment: CRITICAL VALUE NOTED. VALUE IS CONSISTENT WITH PREVIOUSLY REPORTED/CALLED VALUE Performed at [facility], [address] [place], [place] [address]   TSH     Status: None   Collection Time: 04/16/23  3:42 PM  Result Value Ref Range   TSH 0.602 0.350 - 4.500 uIU/mL    Comment: Performed by a 3rd Generation assay with a functional sensitivity of <=0.01 uIU/mL. Performed at [facility], [address], [place], [place] [address]   Cortisol     Status: None   Collection Time: 04/16/23  3:42 PM  Result Value Ref Range   Cortisol, Plasma 11.0 ug/dL    Comment: (NOTE) AM    6.7 - 22.6 ug/dL PM   <86.5       ug/dL Performed at [facility], [address], [place], [place] [address]   Sodium     Status: Abnormal   Collection Time: 04/16/23  3:42 PM  Result Value Ref Range   Sodium 110 (LL) 135 - 145 mmol/L    Comment: CRITICAL VALUE NOTED. VALUE IS CONSISTENT WITH PREVIOUSLY REPORTED/CALLED VALUE Performed at [facility], [address], [place], [place]  [address]   Hepatitis panel, acute     Status: None   Collection Time: 04/16/23  3:42 PM  Result Value Ref Range   Hepatitis B Surface Ag NON REACTIVE NON REACTIVE   HCV Ab NON REACTIVE NON REACTIVE    Comment: (NOTE) Nonreactive HCV antibody screen is consistent with no HCV infections,  unless recent infection is suspected or other evidence exists to indicate HCV infection.     Hep A IgM NON REACTIVE NON REACTIVE   Hep B C IgM NON REACTIVE NON REACTIVE    Comment: Performed at [facility], [address] [place], [place] [address]  Sodium     Status: Abnormal   Collection Time: 04/16/23  7:39 PM  Result Value Ref Range   Sodium 116 (LL) 135 - 145 mmol/L    Comment: Billy CHECK NOTED CRITICAL RESULT CALLED TO, READ BACK BY AND VERIFIED WITH A. MOYER, RNN 2106 04/16/23 BY Gearld Keep Performed at [facility], [address] [place], [place] [address]   Sodium     Status: Abnormal   Collection Time: 04/17/23 12:05 AM  Result Value Ref Range   Sodium 116 (LL) 135 - 145 mmol/L    Comment: CRITICAL RESULT CALLED TO, READ BACK BY AND VERIFIED WITH Gearldean Keepers, RN [phone] 04/17/23 BY Gearld Keep Performed at [facility], [address], [place], [place] [address]   Ammonia     Status: None   Collection Time: 04/17/23  3:08 AM  Result Value Ref Range   Ammonia 35 9 - 35 umol/L    Comment: HEMOLYSIS AT THIS LEVEL MAY AFFECT RESULT Performed at [facility], [address] [place], [place] [address]   Comprehensive metabolic panel with GFR     Status: Abnormal   Collection Time: 04/17/23  3:08 AM  Result Value Ref Range   Sodium 118 (LL) 135 - 145 mmol/L    Comment: CRITICAL RESULT CALLED TO, READ BACK BY AND VERIFIED WITH A. CHAVEZ, RN 0448 04/17/23 BY V. NICANOR    Potassium 3.5 3.5 - 5.1 mmol/L   Chloride 84 (L) 98 - 111 mmol/L   CO2 23 22 - 32 mmol/L   Glucose,  Bld 99 70 - 99 mg/dL    Comment: Glucose reference  range applies only to samples taken after fasting for at least 8 hours.   BUN <5 (L) 6 - 20 mg/dL   Creatinine, Ser 1.61 (L) 0.61 - 1.24 mg/dL   Calcium  8.1 (L) 8.9 - 10.3 mg/dL   Total Protein 6.0 (L) 6.5 - 8.1 g/dL   Albumin 2.9 (L) 3.5 - 5.0 g/dL   AST 52 (H) 15 - 41 U/L   ALT 31 0 - 44 U/L   Alkaline Phosphatase 74 38 - 126 U/L   Total Bilirubin 0.4 0.0 - 1.2 mg/dL   GFR, Estimated >09 >60 mL/min    Comment: (NOTE) Calculated using the CKD-EPI Creatinine Equation (2021)    Anion gap 11 5 - 15    Comment: Performed at [facility], [address], [place], [place] [address]  Magnesium      Status: None   Collection Time: 04/17/23  3:08 AM  Result Value Ref Range   Magnesium  1.7 1.7 - 2.4 mg/dL    Comment: Performed at [facility], [address] [place], [place] [address]  Phosphorus     Status: None   Collection Time: 04/17/23  3:08 AM  Result Value Ref Range   Phosphorus 2.9 2.5 - 4.6 mg/dL    Comment: Performed at [facility], [address] [place], [place] [address]  Protime-INR     Status: None   Collection Time: 04/17/23  3:08 AM  Result Value Ref Range   Prothrombin Time 13.5 11.4 - 15.2 seconds   INR 1.0 0.8 - 1.2    Comment: (NOTE) INR goal varies based on device and disease states. Performed at [facility], [address], [place], [place] [address]  Sodium     Status: Abnormal   Collection Time: 04/20/23 12:01 PM  Result Value Ref Range    Sodium 119 (LL) 135 - 145 mmol/L    Comment: CRITICAL RESULT CALLED TO, READ BACK BY AND VERIFIED WITH rn j jimenez at 1301 04/20/23 cru[place]hank a Performed at [facility], [address], [place], [place] [address]   Sodium     Status: Abnormal   Collection Time: 04/20/23  5:39 PM  Result Value Ref Range   Sodium 125 (L) 135 - 145 mmol/L    Comment: Billy CHECK NOTED Performed at [facility], [address] [place], [place] [address]   Sodium     Status: Abnormal   Collection Time: 04/20/23 11:40 PM  Result Value Ref Range   Sodium 123 (L) 135 - 145 mmol/L    Comment: Performed at [facility], [address], [place], [place] [address]  Basic metabolic panel     Status: Abnormal   Collection Time: 04/21/23  5:40 AM  Result Value Ref Range   Sodium 125 (L) 135 - 145 mmol/L   Potassium 3.7 3.5 - 5.1 mmol/L   Chloride 91 (L) 98 - 111 mmol/L   CO2 24 22 - 32 mmol/L   Glucose, Bld 100 (H) 70 - 99 mg/dL    Comment: Glucose reference range applies only to samples taken after fasting for at least 8 hours.   BUN 6 6 - 20 mg/dL   Creatinine, Ser 9.14 (L) 0.61 - 1.24 mg/dL   Calcium  8.8 (L) 8.9 - 10.3 mg/dL   GFR, Estimated >78 >29 mL/min    Comment: (NOTE) Calculated using the CKD-EPI Creatinine Equation (2021)    Anion gap 10 5 - 15    Comment: Performed at [facility], [address] [place], [place] [address]  Sodium     Status: Abnormal   Collection Time: 04/21/23 11:33 AM  Result Value Ref Range   Sodium 125 (L) 135 - 145 mmol/L    Comment: Performed at [facility], [address] [place], [place] [address]  Sodium     Status: Abnormal   Collection Time: 04/21/23  4:48 PM  Result Value Ref Range   Sodium 126 (L) 135 - 145 mmol/L    Comment: Performed at [facility], [address] [place], [place] [address]  Sodium     Status: Abnormal   Collection Time:  04/21/23 11:06 PM  Result Value Ref Range   Sodium 126 (L) 135 - 145 mmol/L    Comment: Performed at [facility], [address] [place], [place] [address]  CBC with Differential/Platelet     Status: Abnormal   Collection Time: 04/22/23  5:48 AM  Result Value Ref Range   WBC 7.9 4.0 - 10.5 K/uL   RBC 3.79 (L) 4.22 - 5.81 MIL/uL   Hemoglobin 12.9 (L) 13.0 - 17.0 g/dL   HCT 41.3 (L) 24.4 - 01.0 %   MCV 98.4 80.0 - 100.0 fL   MCH 34.0 26.0 - 34.0 pg   MCHC 34.6 30.0 - 36.0 g/dL   RDW 27.2 53.6 - 64.4 %   Platelets 346 150 - 400 K/uL   nRBC 0.0 0.0 - 0.2 %   Neutrophils Relative % 59 %   Neutro Abs 4.6 1.7 - 7.7 K/uL   Lymphocytes Relative 22 %   Lymphs Abs 1.8 0.7 - 4.0 K/uL   Monocytes  Relative 10 %   Monocytes Absolute 0.8 0.1 - 1.0 K/uL   Eosinophils Relative 7 %   Eosinophils Absolute 0.5 0.0 - 0.5 K/uL   Basophils Relative 1 %   Basophils Absolute 0.1 0.0 - 0.1 K/uL   Immature Granulocytes 1 %   Abs Immature Granulocytes 0.06 0.00 - 0.07 K/uL    Comment: Performed at [facility], [address], [place], [place] [address]  Comprehensive metabolic panel with GFR     Status: Abnormal   Collection Time: 04/22/23  5:48 AM  Result Value Ref Range   Sodium 126 (L) 135 - 145 mmol/L   Potassium 3.5 3.5 - 5.1 mmol/L   Chloride 92 (L) 98 - 111 mmol/L   CO2 22 22 - 32 mmol/L   Glucose, Bld 87 70 - 99 mg/dL    Comment: Glucose reference range applies only to samples taken after fasting for at least 8 hours.   BUN 6 6 - 20 mg/dL   Creatinine, Ser 6.04 (L) 0.61 - 1.24 mg/dL   Calcium  8.5 (L) 8.9 - 10.3 mg/dL   Total Protein 6.7 6.5 - 8.1 g/dL   Albumin 3.1 (L) 3.5 - 5.0 g/dL   AST 31 15 - 41 U/L   ALT 14 0 - 44 U/L   Alkaline Phosphatase 71 38 - 126 U/L   Total Bilirubin 0.7 0.0 - 1.2 mg/dL   GFR, Estimated >54 >09 mL/min    Comment: (NOTE) Calculated using the CKD-EPI Creatinine Equation (2021)    Anion gap 12 5 - 15    Comment:  Performed at [facility], [address] [place], [place] [address]  Magnesium      Status: Abnormal   Collection Time: 04/22/23  5:48 AM  Result Value Ref Range   Magnesium  1.6 (L) 1.7 - 2.4 mg/dL    Comment: Performed at [facility], [address], [place], [place] [address]  Sodium     Status: Abnormal   Collection Time: 04/22/23  2:14 PM  Result Value Ref Range   Sodium 125 (L) 135 - 145 mmol/L    Comment: Performed at [facility], [address], [place], [place] [address]  Sodium     Status: Abnormal   Collection Time: 04/22/23 10:00 PM  Result Value Ref Range   Sodium 128 (L) 135 - 145 mmol/L    Comment: Performed at [facility], [address] [place], [place] [address]  Basic metabolic panel with GFR     Status: Abnormal   Collection Time: 04/23/23  8:34 AM  Result Value Ref Range   Sodium 129 (L) 135 - 145 mmol/L   Potassium 3.6 3.5 - 5.1 mmol/L   Chloride 96 (L) 98 - 111 mmol/L   CO2 22 22 - 32 mmol/L   Glucose, Bld 82 70 - 99 mg/dL    Comment: Glucose reference range applies only to samples taken after fasting for at least 8 hours.   BUN <5 (L) 6 - 20 mg/dL   Creatinine, Ser 8.46 (L) 0.61 - 1.24 mg/dL   Calcium  8.6 (L) 8.9 - 10.3 mg/dL   GFR, Estimated >96 >29 mL/min    Comment: (NOTE) Calculated using the CKD-EPI Creatinine Equation (2021)    Anion gap 11 5 - 15    Comment: Performed at [facility], [address] [place], [place] [address]  CBG monitoring, ED     Status: Abnormal   Collection  Time: 04/23/23  5:53 PM  Result Value Ref Range   Glucose-Capillary 148 (H) 70 - 99 mg/dL    Comment: Glucose reference range applies only to samples taken after fasting for at least 8 hours.  CBC with Differential     Status: Abnormal   Collection Time: 04/23/23  6:02 PM  Result Value Ref Range   WBC 10.0 4.0 - 10.5 K/uL   RBC 4.20 (L) 4.22 - 5.81 MIL/uL   Hemoglobin  14.2 13.0 - 17.0 g/dL   HCT 56.2 13.0 - 86.5 %   MCV 94.5 80.0 - 100.0 fL   MCH 33.8 26.0 - 34.0 pg   MCHC 35.8 30.0 - 36.0 g/dL   RDW 78.4 69.6 - 29.5 %   Platelets 446 (H) 150 - 400 K/uL   nRBC 0.0 0.0 - 0.2 %   Neutrophils Relative % 64 %   Neutro Abs 6.3 1.7 - 7.7 K/uL   Lymphocytes Relative 25 %   Lymphs Abs 2.5 0.7 - 4.0 K/uL   Monocytes Relative 7 %   Monocytes Absolute 0.7 0.1 - 1.0 K/uL   Eosinophils Relative 3 %   Eosinophils Absolute 0.3 0.0 - 0.5 K/uL   Basophils Relative 1 %   Basophils Absolute 0.1 0.0 - 0.1 K/uL   Immature Granulocytes 0 %   Abs Immature Granulocytes 0.04 0.00 - 0.07 K/uL    Comment: Performed at [facility], [address], [place], [place] [address]  Comprehensive metabolic panel     Status: Abnormal   Collection Time: 04/23/23  6:02 PM  Result Value Ref Range   Sodium 129 (L) 135 - 145 mmol/L   Potassium 3.5 3.5 - 5.1 mmol/L   Chloride 94 (L) 98 - 111 mmol/L   CO2 18 (L) 22 - 32 mmol/L   Glucose, Bld 133 (H) 70 - 99 mg/dL    Comment: Glucose reference range applies only to samples taken after fasting for at least 8 hours.   BUN 7 6 - 20 mg/dL   Creatinine, Ser 2.44 (L) 0.61 - 1.24 mg/dL   Calcium  9.1 8.9 - 10.3 mg/dL   Total Protein 7.2 6.5 - 8.1 g/dL   Albumin 3.8 3.5 - 5.0 g/dL   AST 33 15 - 41 U/L   ALT 15 0 - 44 U/L   Alkaline Phosphatase 84 38 - 126 U/L   Total Bilirubin 0.7 0.0 - 1.2 mg/dL   GFR, Estimated >01 >02 mL/min    Comment: (NOTE) Calculated using the CKD-EPI Creatinine Equation (2021)    Anion gap 17 (H) 5 - 15    Comment: Performed at [facility], [address], [place], [place] [address]  Troponin I (High Sensitivity)     Status: None   Collection Time: 04/23/23  6:02 PM  Result Value Ref Range   Troponin I (High Sensitivity) 4 <18 ng/L    Comment: (NOTE) Elevated high sensitivity troponin I (hsTnI) values and significant  changes across serial measurements may suggest ACS  but many other  chronic and acute conditions are known to elevate hsTnI results.  Refer to the "Links" section for chest pain algorithms and additional  guidance. Performed at [facility], [address], [place], [place] [address]   Lipase, blood     Status: None   Collection Time: 04/23/23  6:02 PM  Result Value Ref Range   Lipase 24 11 - 51 U/L    Comment: Performed at [facility], [address] [place], [place] [address]  Lamotrigine  level     Status: Abnormal   Collection Time: 04/23/23  6:02 PM  Result Value Ref Range   Lamotrigine  Lvl <1.0 (L) 2.0 - 20.0 ug/mL    Comment: (NOTE)                                Detection Limit = 1.0 Performed At: [facility] [address] [place], [place] [id number] Pearlean Botts MD [id number]   Basic metabolic panel     Status: Abnormal   Collection Time: 04/24/23  5:52 AM  Result Value Ref Range   Sodium 132 (L) 135 - 145 mmol/L   Potassium 3.4 (L) 3.5 - 5.1 mmol/L   Chloride 98 98 - 111 mmol/L   CO2 23 22 - 32 mmol/L   Glucose, Bld 81 70 - 99 mg/dL    Comment: Glucose reference range applies only to samples taken after fasting for at least 8 hours.   BUN 5 (L) 6 - 20 mg/dL   Creatinine, Ser 3.08 (L) 0.61 - 1.24 mg/dL   Calcium  8.8 (L) 8.9 - 10.3 mg/dL   GFR, Estimated >65 >78 mL/min    Comment: (NOTE) Calculated using the CKD-EPI Creatinine Equation (2021)    Anion gap 11 5 - 15    Comment: Performed at [facility], [address], [place], [place] [address]  CBC with Differential/Platelet     Status: Abnormal   Collection Time: 04/24/23  5:52 AM  Result Value Ref Range   WBC 7.9 4.0 - 10.5 K/uL   RBC 3.38 (L) 4.22 - 5.81 MIL/uL   Hemoglobin 11.7 (L) 13.0 - 17.0 g/dL   HCT 95.2 (L) 84.1 - 32.4 %   MCV 98.8 80.0 - 100.0 fL   MCH 34.6 (H) 26.0 - 34.0 pg   MCHC 35.0 30.0 - 36.0 g/dL   RDW 40.1 02.7 - 25.3 %   Platelets 366 150 - 400 K/uL   nRBC 0.0 0.0  - 0.2 %   Neutrophils Relative % 67 %   Neutro Abs 5.3 1.7 - 7.7 K/uL   Lymphocytes Relative 19 %   Lymphs Abs 1.5 0.7 - 4.0 K/uL   Monocytes Relative 9 %   Monocytes Absolute 0.7 0.1 - 1.0 K/uL   Eosinophils Relative 4 %   Eosinophils Absolute 0.3 0.0 - 0.5 K/uL   Basophils Relative 1 %   Basophils Absolute 0.1 0.0 - 0.1 K/uL   Immature Granulocytes 0 %   Abs Immature Granulocytes 0.03 0.00 - 0.07 K/uL    Comment: Performed at [facility], [address] [place], [place] [address]     Psychiatric Specialty Exam: Physical Exam  Review of Systems  Weight 180 lb (81.6 kg).There is no height or weight on file to calculate BMI.  General Appearance: Casual  Eye Contact:  Fair  Speech:  Slow  Volume:  Decreased  Mood:  Dysphoric  Affect:  Congruent  Thought Process:  Descriptions of Associations: Intact  Orientation:  Full (Time, Place, and Person)  Thought Content:  Rumination  Suicidal Thoughts:  No  Homicidal Thoughts:  No  Memory:  Immediate;   Fair Recent;   Fair Remote;   Fair  Judgement:  Fair  Insight:  Shallow  Psychomotor Activity:  Decreased  Concentration:  Concentration: Fair and Attention Span: Fair  Recall:  [employer] of  Knowledge:  Good  Language:  Good  Akathisia:  No  Handed:  Right  AIMS (if indicated):     Assets:  Communication Skills Desire for Improvement Housing Social Support  ADL's:  Intact  Cognition:  WNL  Sleep:  ok       01/26/2023    2:57 PM 12/24/2021    3:49 PM  Depression screen PHQ 2/9  Decreased Interest 0 0  Down, Depressed, Hopeless 0 0  PHQ - 2 Score 0 0    Assessment/Plan: Bipolar I disorder (HCC) - Plan: OLANZapine  (ZYPREXA ) 20 MG tablet, naltrexone  (DEPADE) 50 MG tablet  Alcohol use disorder, moderate, in early remission (HCC) - Plan: OLANZapine  (ZYPREXA ) 20 MG tablet, naltrexone  (DEPADE) 50 MG tablet  I reviewed blood work results.  He was admitted twice.  He had cellulitis and then he had low  sodium and seizures.  He reported nurses coming to change the bandage because wound is not completely healed yet.  He admitted relapse into drinking.  I recommend to go back on naltrexone  since he feels it helped him a lot.  I also recommend not to take the Celexa  for now since he has low sodium and it is not clear if the Celexa  was causing or contributing to low sodium.  He was taking only 10 mg.  Keep olanzapine  20 mg at bedtime.  I do believe you need to see a neurology sooner as patient is taking Keppra  and Lamictal  and not sure what he continue to take.  Encouraged to contact primary care and get a early referral to neurology.  Will follow-up in 2 months .  Discussed not to drink alcohol and hoping the naproxen helped him.  Recommend to call us  back if is any question or any concern.   Follow Up Instructions:     I discussed the assessment and treatment plan with the patient. The patient was provided an opportunity to ask questions and all were answered. The patient agreed with the plan and demonstrated an understanding of the instructions.   The patient was advised to call back or seek an in-person evaluation if the symptoms worsen or if the condition fails to improve as anticipated.    Collaboration of Care: Other provider involved in patient's care AEB notes are available in epic to review  Patient/Guardian was advised Release of Information must be obtained prior to any record release in order to collaborate their care with an outside provider. Patient/Guardian was advised if they have not already done so to contact the registration department to sign all necessary forms in order for us  to release information regarding their care.   Consent: Patient/Guardian gives verbal consent for treatment and assignment of benefits for services provided during this visit. Patient/Guardian expressed understanding and agreed to proceed.     Total encounter time 28 minutes which includes face-to-face  time, chart reviewed, care coordination, order entry and documentation during this encounter.   Note: This document was prepared by [employer] voice dictation technology and any errors that results from this process are unintentional.    Arturo Late, MD 05/07/2023

## 2023-05-10 ENCOUNTER — Ambulatory Visit (INDEPENDENT_AMBULATORY_CARE_PROVIDER_SITE_OTHER): Payer: Self-pay | Admitting: Nurse Practitioner

## 2023-05-10 ENCOUNTER — Encounter: Payer: Self-pay | Admitting: Nurse Practitioner

## 2023-05-10 VITALS — BP 116/69 | HR 86 | Temp 97.8°F | Wt 180.0 lb

## 2023-05-10 DIAGNOSIS — F17200 Nicotine dependence, unspecified, uncomplicated: Secondary | ICD-10-CM

## 2023-05-10 DIAGNOSIS — E871 Hypo-osmolality and hyponatremia: Secondary | ICD-10-CM | POA: Diagnosis not present

## 2023-05-10 DIAGNOSIS — Z09 Encounter for follow-up examination after completed treatment for conditions other than malignant neoplasm: Secondary | ICD-10-CM | POA: Diagnosis not present

## 2023-05-10 DIAGNOSIS — M48061 Spinal stenosis, lumbar region without neurogenic claudication: Secondary | ICD-10-CM | POA: Diagnosis not present

## 2023-05-10 DIAGNOSIS — F17210 Nicotine dependence, cigarettes, uncomplicated: Secondary | ICD-10-CM

## 2023-05-10 DIAGNOSIS — I480 Paroxysmal atrial fibrillation: Secondary | ICD-10-CM | POA: Diagnosis not present

## 2023-05-10 DIAGNOSIS — I10 Essential (primary) hypertension: Secondary | ICD-10-CM

## 2023-05-10 DIAGNOSIS — F1010 Alcohol abuse, uncomplicated: Secondary | ICD-10-CM | POA: Diagnosis not present

## 2023-05-10 DIAGNOSIS — R569 Unspecified convulsions: Secondary | ICD-10-CM

## 2023-05-10 DIAGNOSIS — Z1211 Encounter for screening for malignant neoplasm of colon: Secondary | ICD-10-CM | POA: Diagnosis not present

## 2023-05-10 DIAGNOSIS — L03115 Cellulitis of right lower limb: Secondary | ICD-10-CM

## 2023-05-10 DIAGNOSIS — L03116 Cellulitis of left lower limb: Secondary | ICD-10-CM

## 2023-05-10 MED ORDER — GABAPENTIN 100 MG PO CAPS: 100.0000 mg | ORAL_CAPSULE | Freq: Two times a day (BID) | ORAL | 1 refills | Status: DC

## 2023-05-10 NOTE — Assessment & Plan Note (Signed)
 Currently not on medication but was on medication in the past Recent magnesium  level was normal Encouraged to avoid alcohol Rechecking labs

## 2023-05-10 NOTE — Assessment & Plan Note (Signed)
 Continue Cardizem  360 mg daily, aspirin  81 mg daily Patient referred to cardiology

## 2023-05-10 NOTE — Assessment & Plan Note (Signed)
 Uses a walker for ambulation Gabapentin  was discontinued at the hospital but he has been taking the medication at home Gabapentin  100 mg twice daily restarted

## 2023-05-10 NOTE — Assessment & Plan Note (Signed)
 Cessation encouraged Continue naproxen 50 mg daily

## 2023-05-10 NOTE — Assessment & Plan Note (Signed)
 Lab Results  Component Value Date   NA 132 (L) 04/24/2023   K 3.4 (L) 04/24/2023   CO2 23 04/24/2023   GLUCOSE 81 04/24/2023   BUN 5 (L) 04/24/2023   CREATININE 0.54 (L) 04/24/2023   CALCIUM  8.8 (L) 04/24/2023   EGFR 111 01/26/2023   GFRNONAA >60 04/24/2023    Sodium chloride  2 g twice daily Rechecking labs Encouraged to avoid alcohol

## 2023-05-10 NOTE — Patient Instructions (Addendum)
 Please call [phone] to schedule your chest CT to [facility] for lung cancer   For the swelling in your lower extremities, be sure to elevate your legs when able, mind the salt intake, stay physically active and consider wearing compression stockings.     Cleanse L lower leg/dorsal foot (intact skin and open) with Vashe wound cleanser,do not rinse and allow to air dry. Apply Xeroform gauze to open wound beds, cover with ABD pad and wrap with Kerlix roll gauze beginning right above toes and ending right below knee. May apply Ace bandage wrapped in same fashion as Kerlix for light compression.     Spinal stenosis of lumbar region, unspecified whether neurogenic claudication present  - gabapentin  (NEURONTIN ) 100 MG capsule; Take 1 capsule (100 mg total) by mouth 2 (two) times daily.  Dispense: 180 capsule; Refill: 1  . Screening for colon cancer  - Cologuard   It is important that you exercise regularly at least 30 minutes 5 times a week as tolerated  Think about what you will eat, plan ahead. Choose " clean, green, fresh or frozen" over canned, processed or packaged foods which are more sugary, salty and fatty. 70 to 75% of food eaten should be vegetables and fruit. Three meals at set times with snacks allowed between meals, but they must be fruit or vegetables. Aim to eat over a 12 hour period , example 7 am to 7 pm, and STOP after  your last meal of the day. Drink water,generally about 64 ounces per day, no other drink is as healthy. Fruit juice is best enjoyed in a healthy way, by EATING the fruit.  Thanks for choosing [facility] we consider it a privelige to serve you.

## 2023-05-10 NOTE — Assessment & Plan Note (Signed)
 Hospital chart reviewed, including discharge summary Medications reconciled and reviewed with the patient in detail

## 2023-05-10 NOTE — Assessment & Plan Note (Deleted)
 Had Unna boots in place on LLE, dressing was removed to examine theextremity Minimal swelling noted.  No redness, discharge noted Patient encouraged to continue compression dressing at home, leg elevation encouraged

## 2023-05-10 NOTE — Assessment & Plan Note (Signed)
 Had Unna boots in place on LLE, dressing was removed to examine theextremity Minimal swelling noted.  No redness, discharge noted Patient encouraged to continue compression dressing at home, leg elevation encouraged

## 2023-05-10 NOTE — Assessment & Plan Note (Signed)
 Smokes about 1 pack/day  Asked about quitting: confirms that he/she currently smokes cigarettes Advise to quit smoking: Educated about QUITTING to reduce the risk of cancer, cardio and cerebrovascular disease. Assess willingness: Unwilling to quit at this time, not working on cutting back. Assist with counseling and pharmacotherapy: Counseled for 5 minutes and literature provided. Arrange for follow up: follow up in 4 months and continue to offer help.  Encouraged to get low-dose chest CT scan done to screen for lung cancer

## 2023-05-10 NOTE — Assessment & Plan Note (Signed)
 Continue Keppra  500 mg twice daily, lamotrigine  50 mg twice daily Stop Keppra  once he started taking lamotrigine  100 mg twice daily Follow-up with neurology

## 2023-05-10 NOTE — Assessment & Plan Note (Signed)
 Well-controlled ,continue Cardizem  360 mg daily BP Readings from Last 3 Encounters:  05/10/23 116/69  04/24/23 (!) 157/78  04/23/23 (!) 157/84

## 2023-05-10 NOTE — Progress Notes (Addendum)
 Established Patient Office Visit  Subjective:  Patient ID: Billy Anderson, male    DOB: 10-23-1965  Age: 58 y.o. MRN: [id number]  CC:  Chief Complaint  Patient presents with   Hospitalization Follow-up    HPI Billy Anderson is a 58 y.o. male  has a past medical history of Arthritis, Bipolar disorder (HCC), Dysrhythmia, GERD (gastroesophageal reflux disease), Hypertension, Pneumonia, and Seizures (HCC).  Patient presents for hospitalization follow-up.  Patient was on admission from 04/15/2023 to 04/23/2023 for hyponatremia and then 04/23/2023 to 04/24/2023 for  seizures.  Hyponatremia  he was initially managed with IV normal saline bolus and sodium chloride  tablets with rapid improvement in his sodium level, nephrology was consulted, etiology of hyponatremia was felt to be most likely secondary to patient's longstanding heavy alcohol use, they also felt that use of Celexa  could be contributing so Celexa  was discontinued.  Patient continues to drink beer 2 to 3 days a week says he drinks he will drink 4 beers.  Taking sodium chloride  2 g twice daily for hyponatremia, not # 50 mg daily for alcohol use disorder.  Cellulitis of left lower extremity.  Was doing daily dressing changes at the hospital now doing weekly dressing changes at home by home health nurse, received course of 7 days IV antibiotics during his hospitalization.  Seizures. during his first  hospitalization patient's Lamictal  was held however on the day of discharge he had breakthrough seizure Lamictal  was redosed.  Currently on Keppra  500 mg twice daily, lamotrigine  50 mg twice daily.  Was recommended to stop taking Keppra  once he is back on lamotrigine  100 mg twice daily.  He is scheduled to see neurology in October 2025, however he would like to see neurology soon.  He is restricted from driving   A-fib.  Currently on Cardizem  360 mg daily, aspirin  81 mg daily.  Not seen cardiology  Tobacco use disorder.  Started smoking in  his teenage years, has cut down to 1 pack a day.  Currently denies shortness of breath cough, wheezing  He is accompanied by his wife who has been supportive      Past Medical History:  Diagnosis Date   Arthritis    psoriatic arthritis   Bipolar disorder (HCC)    Dysrhythmia    PAF   GERD (gastroesophageal reflux disease)    Hypertension    Pneumonia    Seizures (HCC)     Past Surgical History:  Procedure Laterality Date   ANTERIOR CERVICAL DECOMP/DISCECTOMY FUSION N/A 03/20/2022   Procedure: Cervical Three-Four, Cervical Four-Five Anterior Cervical Discectomy Fusion;  Surgeon: Audie Bleacher, MD;  Location: [facility] OR;  Service: Neurosurgery;  Laterality: N/A;  RM 20 to follow 3C   HAND SURGERY Left    carpal tunnel release    Family History  Problem Relation Age of Onset   Bipolar disorder Mother     Social History   Socioeconomic History   Marital status: Married    Spouse name: Not on file   Number of children: 3   Years of education: Not on file   Highest education level: Not on file  Occupational History   Not on file  Tobacco Use   Smoking status: Every Day    Current packs/day: 1.00    Types: Cigarettes   Smokeless tobacco: Not on file  Vaping Use   Vaping status: Some Days  Substance and Sexual Activity   Alcohol use: Not Currently    Alcohol/week: 48.0 standard drinks of alcohol  Types: 48 Shots of liquor per week    Comment: 1 pint a day   Drug use: Never   Sexual activity: Not Currently  Other Topics Concern   Not on file  Social History Narrative   Lives with his spouse   Social Drivers of Health   Financial Resource Strain: Medium Risk (07/24/2020)   Received from [facility]   Overall Financial Resource Strain (CARDIA)    Difficulty of Paying Living Expenses: Somewhat hard  Food Insecurity: No Food Insecurity (04/24/2023)   Hunger Vital Sign    Worried About Running Out of Food in the Last Year: Never true    Ran Out of Food in the  Last Year: Never true  Transportation Needs: No Transportation Needs (04/24/2023)   PRAPARE - Administrator, Civil Service (Medical): No    Lack of Transportation (Non-Medical): No  Physical Activity: Not on file  Stress: Not on file  Social Connections: Not on file  Intimate Partner Violence: Not At Risk (04/24/2023)   Humiliation, Afraid, Rape, and Kick questionnaire    Fear of Current or Ex-Partner: No    Emotionally Abused: No    Physically Abused: No    Sexually Abused: No    Outpatient Medications Prior to Visit  Medication Sig Dispense Refill   aspirin  EC 81 MG tablet Take 1 tablet (81 mg total) by mouth daily. (Patient taking differently: Take 81 mg by mouth at bedtime.) 30 tablet 0   Calcium  Carb-Cholecalciferol  (CALCIUM  600+D3 PO) Take 1 tablet by mouth 2 (two) times daily.     diltiazem  (CARDIZEM  CD) 180 MG 24 hr capsule TAKE 2 CAPSULES BY MOUTH DAILY (Patient taking differently: Take 180 mg by mouth in the morning and at bedtime.) 60 capsule 3   Fexofenadine HCl (ALLEGRA PO) Take 180 mg by mouth daily.     folic acid  (FOLVITE ) 1 MG tablet Take 1 tablet (1 mg total) by mouth daily. 30 tablet 0   lamoTRIgine  (LAMICTAL ) 25 MG tablet Take 2 tablets (50 mg total) by mouth daily for 14 days, THEN 2 tablets (50 mg total) 2 (two) times daily for 14 days. After finishing 50 mg twice a day, transition back to your normal 100 mg twice a day.  Take keppra  while you're titrating to goal.. 84 tablet 0   levETIRAcetam  (KEPPRA ) 500 MG tablet Take 1 tablet (500 mg total) by mouth 2 (two) times daily. Take keppra  until your lamictal  has been titrated up to the goal dose.  After you're back to your normal lamictal  dose, you can stop the keppra . 60 tablet 0   naltrexone  (DEPADE) 50 MG tablet Take 1 tablet (50 mg total) by mouth daily. 30 tablet 1   OLANZapine  (ZYPREXA ) 20 MG tablet Take 1 tablet (20 mg total) by mouth at bedtime. 30 tablet 1   oxybutynin  (DITROPAN -XL) 10 MG 24 hr tablet  TAKE 1 TABLET BY MOUTH AT BEDTIME 90 tablet 0   pantoprazole  (PROTONIX ) 40 MG tablet Take 1 tablet (40 mg total) by mouth daily. 30 tablet 3   sodium chloride  1 g tablet Take 2 tablets (2 g total) by mouth 2 (two) times daily with a meal. (Patient taking differently: Take 2 g by mouth 3 (three) times daily with meals.) 180 tablet 0   thiamine  (VITAMIN B-1) 100 MG tablet Take 1 tablet (100 mg total) by mouth daily. 30 tablet 0   tiZANidine  (ZANAFLEX ) 4 MG tablet Take 4 mg by mouth daily as needed  for muscle spasms.     [START ON 05/22/2023] lamoTRIgine  (LAMICTAL ) 100 MG tablet Take 1 tablet (100 mg total) by mouth 2 (two) times daily. Start this after you've finished the 50 mg twice a day dose. (Patient not taking: Reported on 05/10/2023) 60 tablet 1   No facility-administered medications prior to visit.    Allergies  Allergen Reactions   Ativan  [Lorazepam ] Other (See Comments)    Confusion  Hallucinations    Roxicodone [Oxycodone] Itching   Ultram [Tramadol] Other (See Comments)    Seizures    Vistaril [Hydroxyzine] Itching and Other (See Comments)    Sedation Not effective in reducing anxiety    ROS Review of Systems  Constitutional:  Negative for appetite change, chills, fatigue and fever.  HENT:  Negative for congestion, postnasal drip, rhinorrhea and sneezing.   Respiratory:  Negative for cough, shortness of breath and wheezing.   Cardiovascular:  Negative for chest pain, palpitations and leg swelling.  Gastrointestinal:  Negative for abdominal pain, constipation, nausea and vomiting.  Genitourinary:  Negative for difficulty urinating, dysuria, flank pain and frequency.  Musculoskeletal:  Positive for arthralgias. Negative for back pain, joint swelling and myalgias.  Skin:  Negative for color change, pallor, rash and wound.  Neurological:  Negative for dizziness, facial asymmetry, numbness and headaches.  Psychiatric/Behavioral:  Negative for behavioral problems, confusion,  self-injury and suicidal ideas.       Objective:    Physical Exam Vitals and nursing note reviewed.  Constitutional:      General: He is not in acute distress.    Appearance: Normal appearance. He is not ill-appearing, toxic-appearing or diaphoretic.  HENT:     Mouth/Throat:     Mouth: Mucous membranes are moist.     Pharynx: Oropharynx is clear. No oropharyngeal exudate or posterior oropharyngeal erythema.  Eyes:     General: No scleral icterus.       Right eye: No discharge.        Left eye: No discharge.     Extraocular Movements: Extraocular movements intact.     Conjunctiva/sclera: Conjunctivae normal.  Cardiovascular:     Rate and Rhythm: Normal rate and regular rhythm.     Pulses: Normal pulses.     Heart sounds: Normal heart sounds. No murmur heard.    No friction rub. No gallop.  Pulmonary:     Effort: Pulmonary effort is normal. No respiratory distress.     Breath sounds: Normal breath sounds. No stridor. No wheezing, rhonchi or rales.  Chest:     Chest wall: No tenderness.  Abdominal:     General: There is no distension.     Palpations: Abdomen is soft.     Tenderness: There is no abdominal tenderness. There is no right CVA tenderness, left CVA tenderness or guarding.  Musculoskeletal:        General: No swelling, tenderness, deformity or signs of injury.     Right lower leg: Edema present.     Left lower leg: Edema present.     Comments: Nonpitting edema bilateral lower extremity No redness, discharge, or ulcer noted Using a walker for ambulation  Skin:    General: Skin is warm and dry.     Coloration: Skin is not jaundiced or pale.     Findings: No bruising, erythema or lesion.  Neurological:     Mental Status: He is alert and oriented to person, place, and time.     Motor: No weakness.     Coordination: Coordination  normal.     Gait: Gait abnormal.  Psychiatric:        Mood and Affect: Mood normal.        Behavior: Behavior normal.        Thought  Content: Thought content normal.        Judgment: Judgment normal.     BP 116/69   Pulse 86   Temp 97.8 F (36.6 C)   Wt 180 lb (81.6 kg)   SpO2 96%   BMI 24.41 kg/m  Wt Readings from Last 3 Encounters:  05/10/23 180 lb (81.6 kg)  05/07/23 180 lb (81.6 kg)  04/24/23 178 lb 12.7 oz (81.1 kg)    Lab Results  Component Value Date   TSH 0.602 04/16/2023   Lab Results  Component Value Date   WBC 7.9 04/24/2023   HGB 11.7 (L) 04/24/2023   HCT 33.4 (L) 04/24/2023   MCV 98.8 04/24/2023   PLT 366 04/24/2023   Lab Results  Component Value Date   NA 132 (L) 04/24/2023   K 3.4 (L) 04/24/2023   CO2 23 04/24/2023   GLUCOSE 81 04/24/2023   BUN 5 (L) 04/24/2023   CREATININE 0.54 (L) 04/24/2023   BILITOT 0.7 04/23/2023   ALKPHOS 84 04/23/2023   AST 33 04/23/2023   ALT 15 04/23/2023   PROT 7.2 04/23/2023   ALBUMIN 3.8 04/23/2023   CALCIUM  8.8 (L) 04/24/2023   ANIONGAP 11 04/24/2023   EGFR 111 01/26/2023   No results found for: "CHOL" No results found for: "HDL" No results found for: "LDLCALC" No results found for: "TRIG" No results found for: "CHOLHDL" No results found for: "HGBA1C"    Assessment & Plan:   Problem List Items Addressed This Visit       Cardiovascular and Mediastinum   Essential hypertension   Well-controlled ,continue Cardizem  360 mg daily BP Readings from Last 3 Encounters:  05/10/23 116/69  04/24/23 (!) 157/78  04/23/23 (!) 157/84         Paroxysmal atrial fibrillation (HCC)   Continue Cardizem  360 mg daily, aspirin  81 mg daily Patient referred to cardiology      Relevant Orders   Ambulatory referral to Cardiology     Other   Hyponatremia   Lab Results  Component Value Date   NA 132 (L) 04/24/2023   K 3.4 (L) 04/24/2023   CO2 23 04/24/2023   GLUCOSE 81 04/24/2023   BUN 5 (L) 04/24/2023   CREATININE 0.54 (L) 04/24/2023   CALCIUM  8.8 (L) 04/24/2023   EGFR 111 01/26/2023   GFRNONAA >60 04/24/2023    Sodium chloride  2 g twice  daily Rechecking labs Encouraged to avoid alcohol      Relevant Orders   CMP14+EGFR   Tobacco use disorder   Smokes about 1 pack/day  Asked about quitting: confirms that he/she currently smokes cigarettes Advise to quit smoking: Educated about QUITTING to reduce the risk of cancer, cardio and cerebrovascular disease. Assess willingness: Unwilling to quit at this time, not working on cutting back. Assist with counseling and pharmacotherapy: Counseled for 5 minutes and literature provided. Arrange for follow up: follow up in 4 months and continue to offer help.  Encouraged to get low-dose chest CT scan done to screen for lung cancer      Lumbar spinal stenosis   Uses a walker for ambulation Gabapentin  was discontinued at the hospital but he has been taking the medication at home Gabapentin  100 mg twice daily restarted  Relevant Medications   gabapentin  (NEURONTIN ) 100 MG capsule   Hospital discharge follow-up   Hospital chart reviewed, including discharge summary Medications reconciled and reviewed with the patient in detail       Seizure (HCC) - Primary   Continue Keppra  500 mg twice daily, lamotrigine  50 mg twice daily Stop Keppra  once he started taking lamotrigine  100 mg twice daily Follow-up with neurology      Relevant Medications   gabapentin  (NEURONTIN ) 100 MG capsule   Other Relevant Orders   CBC   CMP14+EGFR   Bilateral cellulitis of lower leg   Had Unna boots in place on LLE, dressing was removed to examine theextremity Minimal swelling noted.  No redness, discharge noted Patient encouraged to continue compression dressing at home, leg elevation encouraged      Alcohol abuse   Cessation encouraged Continue naproxen 50 mg daily      Cellulitis of left lower extremity   Hypomagnesemia   Currently not on medication but was on medication in the past Recent magnesium  level was normal Encouraged to avoid alcohol Rechecking labs      Relevant Orders    Magnesium    Other Visit Diagnoses       Screening for colon cancer       Relevant Orders   Cologuard       Meds ordered this encounter  Medications   gabapentin  (NEURONTIN ) 100 MG capsule    Sig: Take 1 capsule (100 mg total) by mouth 2 (two) times daily.    Dispense:  180 capsule    Refill:  1    Follow-up: Return in about 4 months (around 09/09/2023) for CPE.    Joniel Graumann R Malee Grays, FNP

## 2023-05-11 ENCOUNTER — Ambulatory Visit: Payer: Self-pay

## 2023-05-11 ENCOUNTER — Telehealth: Payer: Self-pay

## 2023-05-11 DIAGNOSIS — I119 Hypertensive heart disease without heart failure: Secondary | ICD-10-CM | POA: Diagnosis not present

## 2023-05-11 DIAGNOSIS — F17210 Nicotine dependence, cigarettes, uncomplicated: Secondary | ICD-10-CM | POA: Diagnosis not present

## 2023-05-11 DIAGNOSIS — F319 Bipolar disorder, unspecified: Secondary | ICD-10-CM | POA: Diagnosis not present

## 2023-05-11 DIAGNOSIS — I480 Paroxysmal atrial fibrillation: Secondary | ICD-10-CM | POA: Diagnosis not present

## 2023-05-11 DIAGNOSIS — G40919 Epilepsy, unspecified, intractable, without status epilepticus: Secondary | ICD-10-CM | POA: Diagnosis not present

## 2023-05-11 DIAGNOSIS — L03116 Cellulitis of left lower limb: Secondary | ICD-10-CM | POA: Diagnosis not present

## 2023-05-11 DIAGNOSIS — Z8701 Personal history of pneumonia (recurrent): Secondary | ICD-10-CM | POA: Diagnosis not present

## 2023-05-11 DIAGNOSIS — E871 Hypo-osmolality and hyponatremia: Secondary | ICD-10-CM | POA: Diagnosis not present

## 2023-05-11 DIAGNOSIS — L4050 Arthropathic psoriasis, unspecified: Secondary | ICD-10-CM | POA: Diagnosis not present

## 2023-05-11 LAB — CBC
Hematocrit: 39.5 % (ref 37.5–51.0)
Hemoglobin: 13.8 g/dL (ref 13.0–17.7)
MCH: 33.9 pg — ABNORMAL HIGH (ref 26.6–33.0)
MCHC: 34.9 g/dL (ref 31.5–35.7)
MCV: 97 fL (ref 79–97)
Platelets: 385 10*3/uL (ref 150–450)
RBC: 4.07 x10E6/uL — ABNORMAL LOW (ref 4.14–5.80)
RDW: 12.3 % (ref 11.6–15.4)
WBC: 7.4 10*3/uL (ref 3.4–10.8)

## 2023-05-11 LAB — MAGNESIUM: Magnesium: 1.8 mg/dL (ref 1.6–2.3)

## 2023-05-11 LAB — CMP14+EGFR
ALT: 11 IU/L (ref 0–44)
AST: 16 IU/L (ref 0–40)
Albumin: 3.9 g/dL (ref 3.8–4.9)
Alkaline Phosphatase: 113 IU/L (ref 44–121)
BUN/Creatinine Ratio: 9 (ref 9–20)
BUN: 5 mg/dL — ABNORMAL LOW (ref 6–24)
Bilirubin Total: 0.4 mg/dL (ref 0.0–1.2)
CO2: 24 mmol/L (ref 20–29)
Calcium: 9.4 mg/dL (ref 8.7–10.2)
Chloride: 92 mmol/L — ABNORMAL LOW (ref 96–106)
Creatinine, Ser: 0.55 mg/dL — ABNORMAL LOW (ref 0.76–1.27)
Globulin, Total: 2.9 g/dL (ref 1.5–4.5)
Glucose: 97 mg/dL (ref 70–99)
Potassium: 5.1 mmol/L (ref 3.5–5.2)
Sodium: 129 mmol/L — ABNORMAL LOW (ref 134–144)
Total Protein: 6.8 g/dL (ref 6.0–8.5)
eGFR: 115 mL/min/{1.73_m2} (ref >59)

## 2023-05-11 NOTE — Telephone Encounter (Signed)
 Devon with [facility] called for clarification of orders for the patient's legs. I advised of what instructions I could see but she wanted to know if there were any orders for his right leg. She would like updated orders for his wound care if possible.    Reason for Disposition  Caller requesting routine or non-urgent lab result  Answer Assessment - Initial Assessment Questions 1. REASON FOR CALL or QUESTION: "What is your reason for calling today?" or "How can I best help you?" or "What question do you have that I can help answer?"     Would like wound care clarification.   2. CALLER: Document the source of call. (e.g., laboratory, patient).     Devon with [facility]  Protocols used: PCP Call - No Triage-A-AH

## 2023-05-11 NOTE — Telephone Encounter (Signed)
 Copied from CRM [id number]. Topic: Clinical - Lab/Test Results >> May 11, 2023  4:43 PM Rennis Case wrote: Reason for CRM: Lab results given. No further questions.

## 2023-05-12 ENCOUNTER — Telehealth: Payer: Self-pay

## 2023-05-12 DIAGNOSIS — Z8701 Personal history of pneumonia (recurrent): Secondary | ICD-10-CM | POA: Diagnosis not present

## 2023-05-12 DIAGNOSIS — F17210 Nicotine dependence, cigarettes, uncomplicated: Secondary | ICD-10-CM | POA: Diagnosis not present

## 2023-05-12 DIAGNOSIS — L4050 Arthropathic psoriasis, unspecified: Secondary | ICD-10-CM | POA: Diagnosis not present

## 2023-05-12 DIAGNOSIS — L03116 Cellulitis of left lower limb: Secondary | ICD-10-CM | POA: Diagnosis not present

## 2023-05-12 DIAGNOSIS — G40919 Epilepsy, unspecified, intractable, without status epilepticus: Secondary | ICD-10-CM | POA: Diagnosis not present

## 2023-05-12 DIAGNOSIS — I119 Hypertensive heart disease without heart failure: Secondary | ICD-10-CM | POA: Diagnosis not present

## 2023-05-12 DIAGNOSIS — E871 Hypo-osmolality and hyponatremia: Secondary | ICD-10-CM | POA: Diagnosis not present

## 2023-05-12 DIAGNOSIS — F319 Bipolar disorder, unspecified: Secondary | ICD-10-CM | POA: Diagnosis not present

## 2023-05-12 DIAGNOSIS — I480 Paroxysmal atrial fibrillation: Secondary | ICD-10-CM | POA: Diagnosis not present

## 2023-05-12 NOTE — Telephone Encounter (Signed)
 Copied from CRM [id number]. Topic: Clinical - Home Health Verbal Orders >> May 11, 2023  5:39 PM Phil Braun wrote: Caller/Agency: [facility] Callback Number: [phone] Service Requested: Skilled Nursing Frequency: requesting orders to cleanse bilateral legs with vashe order or wound cleanser and wrap legs with una boot to layer compression. 2x weekly Any new concerns about the patient? No

## 2023-05-12 NOTE — Telephone Encounter (Signed)
 Lvm was left. KH

## 2023-05-13 ENCOUNTER — Encounter: Payer: Self-pay | Admitting: Radiology

## 2023-05-14 ENCOUNTER — Other Ambulatory Visit (HOSPITAL_COMMUNITY): Payer: Self-pay | Admitting: Psychiatry

## 2023-05-14 ENCOUNTER — Telehealth: Payer: Self-pay | Admitting: Nurse Practitioner

## 2023-05-14 DIAGNOSIS — I119 Hypertensive heart disease without heart failure: Secondary | ICD-10-CM | POA: Diagnosis not present

## 2023-05-14 DIAGNOSIS — F17210 Nicotine dependence, cigarettes, uncomplicated: Secondary | ICD-10-CM | POA: Diagnosis not present

## 2023-05-14 DIAGNOSIS — Z8701 Personal history of pneumonia (recurrent): Secondary | ICD-10-CM | POA: Diagnosis not present

## 2023-05-14 DIAGNOSIS — I480 Paroxysmal atrial fibrillation: Secondary | ICD-10-CM | POA: Diagnosis not present

## 2023-05-14 DIAGNOSIS — L03116 Cellulitis of left lower limb: Secondary | ICD-10-CM | POA: Diagnosis not present

## 2023-05-14 DIAGNOSIS — G40919 Epilepsy, unspecified, intractable, without status epilepticus: Secondary | ICD-10-CM | POA: Diagnosis not present

## 2023-05-14 DIAGNOSIS — F319 Bipolar disorder, unspecified: Secondary | ICD-10-CM

## 2023-05-14 DIAGNOSIS — F1021 Alcohol dependence, in remission: Secondary | ICD-10-CM

## 2023-05-14 DIAGNOSIS — E871 Hypo-osmolality and hyponatremia: Secondary | ICD-10-CM | POA: Diagnosis not present

## 2023-05-14 DIAGNOSIS — L4050 Arthropathic psoriasis, unspecified: Secondary | ICD-10-CM | POA: Diagnosis not present

## 2023-05-14 NOTE — Telephone Encounter (Signed)
 Copied from CRM [id number]. Topic: General - Other >> May 14, 2023  1:00 PM Marissa P wrote: Reason for CRM: Devon from [facility] for this patient   CB# [phone]

## 2023-05-17 ENCOUNTER — Other Ambulatory Visit: Payer: Self-pay | Admitting: Nurse Practitioner

## 2023-05-17 ENCOUNTER — Telehealth: Payer: Self-pay

## 2023-05-17 ENCOUNTER — Ambulatory Visit: Payer: Self-pay

## 2023-05-17 DIAGNOSIS — N3281 Overactive bladder: Secondary | ICD-10-CM

## 2023-05-17 NOTE — Telephone Encounter (Signed)
 Completed.

## 2023-05-17 NOTE — Telephone Encounter (Signed)
 Billy Anderson from [facility] called in returning a voicemail from [place] CMA Friday afternoon. Billy Anderson initially called for clarification about pt's orders pertaining to wound care for his legs and an Radio broadcast assistant. RN called the [facility] and was connected with Charice. RN connect [place] and [place] and then dropped off the call.    Reason for Disposition  Health Information question, no triage required and triager able to answer question  Answer Assessment - Initial Assessment Questions 1. REASON FOR CALL or QUESTION: "What is your reason for calling today?" or "How can I best help you?" or "What question do you have that I can help answer?"     Billy Anderson from [facility] is returning a voicemail from Friday 4/25. Charice CMA called per chart review.  Billy Anderson states that pt's leg wounds were being dressed with xerofoam, ABD pad, and then wrapped. Billy Anderson states pt has no wounds at this time. Billy Anderson also states pt has orders for an Radio broadcast assistant. Billy Anderson needs clarification about orders and whether pt should be wearing that boot. Pt seen 4/21.  Protocols used: Information Only Call - No Triage-A-AH

## 2023-05-18 ENCOUNTER — Telehealth: Payer: Self-pay

## 2023-05-18 ENCOUNTER — Other Ambulatory Visit: Payer: Self-pay | Admitting: Nurse Practitioner

## 2023-05-18 ENCOUNTER — Ambulatory Visit: Payer: Self-pay

## 2023-05-18 DIAGNOSIS — L4050 Arthropathic psoriasis, unspecified: Secondary | ICD-10-CM | POA: Diagnosis not present

## 2023-05-18 DIAGNOSIS — Z8701 Personal history of pneumonia (recurrent): Secondary | ICD-10-CM | POA: Diagnosis not present

## 2023-05-18 DIAGNOSIS — F319 Bipolar disorder, unspecified: Secondary | ICD-10-CM | POA: Diagnosis not present

## 2023-05-18 DIAGNOSIS — L03116 Cellulitis of left lower limb: Secondary | ICD-10-CM | POA: Diagnosis not present

## 2023-05-18 DIAGNOSIS — I119 Hypertensive heart disease without heart failure: Secondary | ICD-10-CM | POA: Diagnosis not present

## 2023-05-18 DIAGNOSIS — G40919 Epilepsy, unspecified, intractable, without status epilepticus: Secondary | ICD-10-CM | POA: Diagnosis not present

## 2023-05-18 DIAGNOSIS — I480 Paroxysmal atrial fibrillation: Secondary | ICD-10-CM | POA: Diagnosis not present

## 2023-05-18 DIAGNOSIS — F17210 Nicotine dependence, cigarettes, uncomplicated: Secondary | ICD-10-CM | POA: Diagnosis not present

## 2023-05-18 DIAGNOSIS — R600 Localized edema: Secondary | ICD-10-CM

## 2023-05-18 DIAGNOSIS — E871 Hypo-osmolality and hyponatremia: Secondary | ICD-10-CM | POA: Diagnosis not present

## 2023-05-18 NOTE — Telephone Encounter (Signed)
 Copied from CRM [id number]. Topic: Clinical - Red Word Triage >> May 18, 2023  4:09 PM Baldomero Bone wrote: Red Word that prompted transfer to Nurse Triage: Billy Anderson home health needs to speak with nurse regarding edema in legs. left leg has a red hue to it.   Chief Complaint: Medical advice  Disposition: [] ED /[] Urgent Care (no appt availability in office) / [] Appointment(In office/virtual)/ []  [facility] [place] [facility]/ [] Home Care/ [] Refused Recommended Disposition /[] [place] [facility]/ [x]  Follow-up with PCP Additional Notes: Devon with [facility] Calling for odor clarification. Sts she has been using compression socks while waiting on order, [place] reports that pts LLE has red hue and at crease of foot, it reddened. Redness more swollen on left leg than, but not open. Call made to CAL transferred to Nurse Jullie Oiler for assistance Reason for Disposition  [1] Caller requests to speak ONLY to PCP AND [2] URGENT question  Answer Assessment - Initial Assessment Questions 1. REASON FOR CALL or QUESTION: "What is your reason for calling today?" or "How can I best help you?" or "What question do you have that I can help answer?"     Need order clarification  2. CALLER: Document the source of call. (e.g., laboratory, patient).     [facility]  Protocols used: PCP Call - No Triage-A-AH

## 2023-05-18 NOTE — Telephone Encounter (Signed)
 Sent to PCP ?

## 2023-05-18 NOTE — Telephone Encounter (Signed)
 Devon from [facility] called from [phone]  Pt has redness and more swelling on the left leg.  Also has welling in the right .No open areas but has crease on the top of left foot . Hx cellulitis. Please advise doppler was completed or can be ordered to rule any concerns  for [employer].  Are we wrapping both or just left.  How often does pt need boot changed . Normally changed Q seven days per [place].  For visit today order she will wrap both. Please advise [facility]

## 2023-05-19 NOTE — Telephone Encounter (Signed)
 Sent message to provider. KH

## 2023-05-20 NOTE — Telephone Encounter (Signed)
 Devon was advise . KH

## 2023-05-20 NOTE — Telephone Encounter (Signed)
 Billy Anderson was advised. KH

## 2023-05-26 ENCOUNTER — Ambulatory Visit: Payer: Self-pay | Admitting: Nurse Practitioner

## 2023-05-26 DIAGNOSIS — G40919 Epilepsy, unspecified, intractable, without status epilepticus: Secondary | ICD-10-CM | POA: Diagnosis not present

## 2023-05-26 DIAGNOSIS — L03116 Cellulitis of left lower limb: Secondary | ICD-10-CM | POA: Diagnosis not present

## 2023-05-26 DIAGNOSIS — I480 Paroxysmal atrial fibrillation: Secondary | ICD-10-CM | POA: Diagnosis not present

## 2023-05-26 DIAGNOSIS — E871 Hypo-osmolality and hyponatremia: Secondary | ICD-10-CM | POA: Diagnosis not present

## 2023-05-26 DIAGNOSIS — Z8701 Personal history of pneumonia (recurrent): Secondary | ICD-10-CM | POA: Diagnosis not present

## 2023-05-26 DIAGNOSIS — F319 Bipolar disorder, unspecified: Secondary | ICD-10-CM | POA: Diagnosis not present

## 2023-05-26 DIAGNOSIS — L4050 Arthropathic psoriasis, unspecified: Secondary | ICD-10-CM | POA: Diagnosis not present

## 2023-05-26 DIAGNOSIS — F17210 Nicotine dependence, cigarettes, uncomplicated: Secondary | ICD-10-CM | POA: Diagnosis not present

## 2023-05-26 DIAGNOSIS — I119 Hypertensive heart disease without heart failure: Secondary | ICD-10-CM | POA: Diagnosis not present

## 2023-05-27 ENCOUNTER — Ambulatory Visit: Payer: Self-pay

## 2023-05-27 ENCOUNTER — Telehealth: Payer: Self-pay | Admitting: Nurse Practitioner

## 2023-05-27 NOTE — Telephone Encounter (Signed)
  Chief Complaint: info only Symptoms: productive yellow/green cough Frequency: x 1 week  Additional Notes: Pt HH RN who was doing routine wound care home visit, is calling to report productive green/yellow cough. RN states that pt has hx of being a long-time smoker. Triage was limited d/t caller not being the patient. RN stated that she is required to notify PCP of sx. Triager reinforced that pt would need to call NT directly to further assess and see if acute visit is needed with PCP. RN will relay info to patient.     Copied from CRM [id number]. Topic: Clinical - Red Word Triage >> May 27, 2023 11:20 AM Everlene Hobby D wrote: [facility] calling from Home Health to let office know patient is  complaining of yellow/ greenish mucus and a cough for the last week. And doesn't seem to be getting better. Reason for Disposition  [1] Caller is not with the adult (patient) AND [2] probable NON-URGENT symptoms  Answer Assessment - Initial Assessment Questions 1. ONSET: "When did the cough begin?"      X 1 week 2. SEVERITY: "How bad is the cough today?"      Denies impacting normal activities 3. SPUTUM: "Describe the color of your sputum" (none, dry cough; clear, white, yellow, green)     Yellow/green 4. HEMOPTYSIS: "Are you coughing up any blood?" If so ask: "How much?" (flecks, streaks, tablespoons, etc.)     denies 5. DIFFICULTY BREATHING: "Are you having difficulty breathing?" If Yes, ask: "How bad is it?" (e.g., mild, moderate, severe)    - MILD: No SOB at rest, mild SOB with walking, speaks normally in sentences, can lie down, no retractions, pulse < 100.    - MODERATE: SOB at rest, SOB with minimal exertion and prefers to sit, cannot lie down flat, speaks in phrases, mild retractions, audible wheezing, pulse 100-120.    - SEVERE: Very SOB at rest, speaks in single words, struggling to breathe, sitting hunched forward, retractions, pulse > 120      denies 6. FEVER: "Do you have a fever?" If Yes, ask: "What  is your temperature, how was it measured, and when did it start?"     denies 7. CARDIAC HISTORY: "Do you have any history of heart disease?" (e.g., heart attack, congestive heart failure)      denies 8. LUNG HISTORY: "Do you have any history of lung disease?"  (e.g., pulmonary embolus, asthma, emphysema)     Hx of chronic smoker 9. PE RISK FACTORS: "Do you have a history of blood clots?" (or: recent major surgery, recent prolonged travel, bedridden)     denies 10. OTHER SYMPTOMS: "Do you have any other symptoms?" (e.g., runny nose, wheezing, chest pain)       denies  Answer Assessment - Initial Assessment Questions 1. REASON FOR CALL or QUESTION: "What is your reason for calling today?" or "How can I best help you?" or "What question do you have that I can help answer?"     Home health RN Sim Dryer calling to report productive yellow/green cough pt has been experiencing x 1 week. RN states that she is required to call and notify PCP. Triager explained that if the patient is symptomatic and requires acute visit with PCP, to have pt call us  back directly to further assist. [facility] RN will relay info to patient to call NT back.  Protocols used: Cough - Acute Productive-A-AH, Information Only Call - No Triage-A-AH

## 2023-05-27 NOTE — Telephone Encounter (Signed)
 Copied from CRM [id number]. Topic: Clinical - Medical Advice >> May 27, 2023 11:22 AM Dominic Friendly wrote: [facility] with Home Health is calling to inform the office Tuesday she is doing a nursing discharge and recommends he follows wound care clinic if the doctor wants him to continue >> May 27, 2023 11:22 AM Everlene Hobby D wrote: Call back [phone]

## 2023-05-27 NOTE — Telephone Encounter (Signed)
 Just FYI  No longer has the wound and if you would like for him to continue he will need to be referred to wound center.  Devon stated that she will measure pt at her next visit for pt compression socks . When we get the number next week he will need them ordered through [facility]. KH

## 2023-05-27 NOTE — Telephone Encounter (Signed)
 Sent to pcp [employer]

## 2023-06-02 DIAGNOSIS — L4050 Arthropathic psoriasis, unspecified: Secondary | ICD-10-CM | POA: Diagnosis not present

## 2023-06-02 DIAGNOSIS — I119 Hypertensive heart disease without heart failure: Secondary | ICD-10-CM | POA: Diagnosis not present

## 2023-06-02 DIAGNOSIS — F17210 Nicotine dependence, cigarettes, uncomplicated: Secondary | ICD-10-CM | POA: Diagnosis not present

## 2023-06-02 DIAGNOSIS — G40919 Epilepsy, unspecified, intractable, without status epilepticus: Secondary | ICD-10-CM | POA: Diagnosis not present

## 2023-06-02 DIAGNOSIS — Z8701 Personal history of pneumonia (recurrent): Secondary | ICD-10-CM | POA: Diagnosis not present

## 2023-06-02 DIAGNOSIS — F319 Bipolar disorder, unspecified: Secondary | ICD-10-CM | POA: Diagnosis not present

## 2023-06-02 DIAGNOSIS — I480 Paroxysmal atrial fibrillation: Secondary | ICD-10-CM | POA: Diagnosis not present

## 2023-06-02 DIAGNOSIS — L03116 Cellulitis of left lower limb: Secondary | ICD-10-CM | POA: Diagnosis not present

## 2023-06-02 DIAGNOSIS — E871 Hypo-osmolality and hyponatremia: Secondary | ICD-10-CM | POA: Diagnosis not present

## 2023-06-03 ENCOUNTER — Telehealth: Payer: Self-pay

## 2023-06-03 DIAGNOSIS — R600 Localized edema: Secondary | ICD-10-CM

## 2023-06-03 NOTE — Telephone Encounter (Signed)
 Copied from CRM [id number]. Topic: General - Other >> Jun 02, 2023  5:03 PM Felizardo Hotter wrote: Reason for CRM: Received call from [facility], per [facility] Ph: [phone] regarding measurements for compression socks: Left 9 inches ankle, 13 inches calf, knee to floor 17 inches and the right 10 inches ankle, 13 inches calf, knee to floor 17 inches. Please call [phone] pt for ETA for delivery of compression socks.   Order was place to home delivery for compression stockings. KH

## 2023-06-07 ENCOUNTER — Ambulatory Visit: Payer: Medicare HMO | Admitting: Podiatry

## 2023-06-07 ENCOUNTER — Encounter: Payer: Self-pay | Admitting: Podiatry

## 2023-06-07 DIAGNOSIS — M79675 Pain in left toe(s): Secondary | ICD-10-CM | POA: Diagnosis not present

## 2023-06-07 DIAGNOSIS — M79674 Pain in right toe(s): Secondary | ICD-10-CM | POA: Diagnosis not present

## 2023-06-07 DIAGNOSIS — I872 Venous insufficiency (chronic) (peripheral): Secondary | ICD-10-CM

## 2023-06-07 DIAGNOSIS — B351 Tinea unguium: Secondary | ICD-10-CM

## 2023-06-07 DIAGNOSIS — D696 Thrombocytopenia, unspecified: Secondary | ICD-10-CM

## 2023-06-07 NOTE — Progress Notes (Signed)
 This patient returns to my office for at risk foot care.  This patient requires this care by a professional since this patient will be at risk due to having thrombocytopenia.  This patient is unable to cut nails himself since the patient cannot reach his nails.These nails are painful walking and wearing shoes.  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  General Appearance  Alert, conversant and in no acute stress.  Vascular  Deferred  Neurologic  Deferred   Nails Thick disfigured discolored nails with subungual debris  from hallux to fifth toes bilaterally. No evidence of bacterial infection or drainage bilaterally.  Orthopedic  No limitations of motion  feet .  No crepitus or effusions noted.  No bony pathology or digital deformities noted.  Skin  normotropic skin with no porokeratosis noted bilaterally.  No signs of infections or ulcers noted.     Onychomycosis  Pain in right toes  Pain in left toes  Consent was obtained for treatment procedures.   Mechanical debridement of nails 1-5  bilaterally performed with a nail nipper.  Filed with dremel without incident.    Return office visit     3 months                 Told patient to return for periodic foot care and evaluation due to potential at risk complications.   Ruffin Cotton DPM

## 2023-06-23 ENCOUNTER — Telehealth (HOSPITAL_COMMUNITY): Payer: Self-pay | Admitting: Psychiatry

## 2023-06-24 ENCOUNTER — Ambulatory Visit: Payer: Medicare Other | Admitting: Neurology

## 2023-07-07 ENCOUNTER — Other Ambulatory Visit (HOSPITAL_COMMUNITY): Payer: Self-pay | Admitting: Psychiatry

## 2023-07-07 DIAGNOSIS — F319 Bipolar disorder, unspecified: Secondary | ICD-10-CM

## 2023-07-07 DIAGNOSIS — F1021 Alcohol dependence, in remission: Secondary | ICD-10-CM

## 2023-07-15 ENCOUNTER — Other Ambulatory Visit (HOSPITAL_COMMUNITY): Payer: Self-pay | Admitting: Psychiatry

## 2023-07-15 DIAGNOSIS — F319 Bipolar disorder, unspecified: Secondary | ICD-10-CM

## 2023-07-15 DIAGNOSIS — F1021 Alcohol dependence, in remission: Secondary | ICD-10-CM

## 2023-07-19 ENCOUNTER — Telehealth (HOSPITAL_COMMUNITY): Payer: Self-pay | Admitting: Psychiatry

## 2023-07-20 ENCOUNTER — Other Ambulatory Visit (HOSPITAL_COMMUNITY): Payer: Self-pay | Admitting: *Deleted

## 2023-07-20 DIAGNOSIS — F319 Bipolar disorder, unspecified: Secondary | ICD-10-CM

## 2023-07-20 DIAGNOSIS — F1021 Alcohol dependence, in remission: Secondary | ICD-10-CM

## 2023-07-20 MED ORDER — OLANZAPINE 20 MG PO TABS: 20.0000 mg | ORAL_TABLET | Freq: Every day | ORAL | 0 refills | Status: DC

## 2023-07-20 MED ORDER — NALTREXONE HCL 50 MG PO TABS: 50.0000 mg | ORAL_TABLET | Freq: Every day | ORAL | 0 refills | Status: DC

## 2023-07-24 ENCOUNTER — Other Ambulatory Visit: Payer: Self-pay | Admitting: Nurse Practitioner

## 2023-08-04 ENCOUNTER — Telehealth: Payer: Self-pay | Admitting: Neurology

## 2023-08-04 NOTE — Telephone Encounter (Signed)
 Pt has cx appointment due to being in hospital, will call back to r/s

## 2023-08-05 ENCOUNTER — Ambulatory Visit: Admitting: Neurology

## 2023-08-06 ENCOUNTER — Inpatient Hospital Stay (HOSPITAL_COMMUNITY)
Admission: EM | Admit: 2023-08-06 | Discharge: 2023-08-09 | DRG: 603 | Disposition: A | Discharge status: Home Health | Attending: Hospitalist | Admitting: Hospitalist

## 2023-08-06 ENCOUNTER — Encounter (HOSPITAL_COMMUNITY): Payer: Self-pay

## 2023-08-06 ENCOUNTER — Other Ambulatory Visit: Payer: Self-pay

## 2023-08-06 DIAGNOSIS — M7989 Other specified soft tissue disorders: Secondary | ICD-10-CM | POA: Diagnosis not present

## 2023-08-06 DIAGNOSIS — L0390 Cellulitis, unspecified: Secondary | ICD-10-CM | POA: Diagnosis not present

## 2023-08-06 DIAGNOSIS — G40909 Epilepsy, unspecified, not intractable, without status epilepticus: Secondary | ICD-10-CM | POA: Diagnosis present

## 2023-08-06 DIAGNOSIS — I739 Peripheral vascular disease, unspecified: Secondary | ICD-10-CM | POA: Diagnosis present

## 2023-08-06 DIAGNOSIS — L4050 Arthropathic psoriasis, unspecified: Secondary | ICD-10-CM | POA: Diagnosis present

## 2023-08-06 DIAGNOSIS — Z888 Allergy status to other drugs, medicaments and biological substances status: Secondary | ICD-10-CM

## 2023-08-06 DIAGNOSIS — I10 Essential (primary) hypertension: Secondary | ICD-10-CM | POA: Diagnosis present

## 2023-08-06 DIAGNOSIS — F419 Anxiety disorder, unspecified: Secondary | ICD-10-CM | POA: Diagnosis present

## 2023-08-06 DIAGNOSIS — L03116 Cellulitis of left lower limb: Principal | ICD-10-CM | POA: Diagnosis present

## 2023-08-06 DIAGNOSIS — Z87891 Personal history of nicotine dependence: Secondary | ICD-10-CM

## 2023-08-06 DIAGNOSIS — I480 Paroxysmal atrial fibrillation: Secondary | ICD-10-CM | POA: Diagnosis present

## 2023-08-06 DIAGNOSIS — Z79899 Other long term (current) drug therapy: Secondary | ICD-10-CM

## 2023-08-06 DIAGNOSIS — Z7982 Long term (current) use of aspirin: Secondary | ICD-10-CM | POA: Diagnosis not present

## 2023-08-06 DIAGNOSIS — I872 Venous insufficiency (chronic) (peripheral): Secondary | ICD-10-CM | POA: Diagnosis present

## 2023-08-06 DIAGNOSIS — L03115 Cellulitis of right lower limb: Secondary | ICD-10-CM | POA: Diagnosis present

## 2023-08-06 DIAGNOSIS — F1011 Alcohol abuse, in remission: Secondary | ICD-10-CM | POA: Diagnosis present

## 2023-08-06 DIAGNOSIS — Z885 Allergy status to narcotic agent status: Secondary | ICD-10-CM

## 2023-08-06 DIAGNOSIS — E871 Hypo-osmolality and hyponatremia: Secondary | ICD-10-CM | POA: Diagnosis present

## 2023-08-06 DIAGNOSIS — E876 Hypokalemia: Secondary | ICD-10-CM | POA: Diagnosis present

## 2023-08-06 DIAGNOSIS — K219 Gastro-esophageal reflux disease without esophagitis: Secondary | ICD-10-CM | POA: Diagnosis present

## 2023-08-06 DIAGNOSIS — Z818 Family history of other mental and behavioral disorders: Secondary | ICD-10-CM | POA: Diagnosis not present

## 2023-08-06 DIAGNOSIS — F319 Bipolar disorder, unspecified: Secondary | ICD-10-CM | POA: Diagnosis present

## 2023-08-06 DIAGNOSIS — L03031 Cellulitis of right toe: Secondary | ICD-10-CM | POA: Diagnosis not present

## 2023-08-06 LAB — CBC WITH DIFFERENTIAL/PLATELET
Abs Immature Granulocytes: 0.05 K/uL (ref 0.00–0.07)
Basophils Absolute: 0.1 K/uL (ref 0.0–0.1)
Basophils Relative: 0 %
Eosinophils Absolute: 0.3 K/uL (ref 0.0–0.5)
Eosinophils Relative: 3 %
HCT: 40.7 % (ref 39.0–52.0)
Hemoglobin: 14.3 g/dL (ref 13.0–17.0)
Immature Granulocytes: 0 %
Lymphocytes Relative: 10 %
Lymphs Abs: 1.1 K/uL (ref 0.7–4.0)
MCH: 34 pg (ref 26.0–34.0)
MCHC: 35.1 g/dL (ref 30.0–36.0)
MCV: 96.9 fL (ref 80.0–100.0)
Monocytes Absolute: 0.6 K/uL (ref 0.1–1.0)
Monocytes Relative: 6 %
Neutro Abs: 9.3 K/uL — ABNORMAL HIGH (ref 1.7–7.7)
Neutrophils Relative %: 81 %
Platelets: 362 K/uL (ref 150–400)
RBC: 4.2 MIL/uL — ABNORMAL LOW (ref 4.22–5.81)
RDW: 12.2 % (ref 11.5–15.5)
WBC: 11.4 K/uL — ABNORMAL HIGH (ref 4.0–10.5)
nRBC: 0 % (ref 0.0–0.2)

## 2023-08-06 LAB — COMPREHENSIVE METABOLIC PANEL WITH GFR
ALT: 15 U/L (ref 0–44)
AST: 21 U/L (ref 15–41)
Albumin: 3.3 g/dL — ABNORMAL LOW (ref 3.5–5.0)
Alkaline Phosphatase: 96 U/L (ref 38–126)
Anion gap: 10 (ref 5–15)
BUN: <5 mg/dL — ABNORMAL LOW (ref 6–20)
CO2: 26 mmol/L (ref 22–32)
Calcium: 8.7 mg/dL — ABNORMAL LOW (ref 8.9–10.3)
Chloride: 88 mmol/L — ABNORMAL LOW (ref 98–111)
Creatinine, Ser: 0.5 mg/dL — ABNORMAL LOW (ref 0.61–1.24)
GFR, Estimated: >60 mL/min (ref >60)
Glucose, Bld: 100 mg/dL — ABNORMAL HIGH (ref 70–99)
Potassium: 4.2 mmol/L (ref 3.5–5.1)
Sodium: 124 mmol/L — ABNORMAL LOW (ref 135–145)
Total Bilirubin: 0.7 mg/dL (ref 0.0–1.2)
Total Protein: 7.3 g/dL (ref 6.5–8.1)

## 2023-08-06 LAB — I-STAT CG4 LACTIC ACID, ED: Lactic Acid, Venous: 1 mmol/L (ref 0.5–1.9)

## 2023-08-06 NOTE — ED Triage Notes (Signed)
 PT bib EMS. PT has history of cellulitis has flare up on both legs left leg is weeping.  EMS vitals.  BP: 132/62 HR: 82 SpO2: 96%

## 2023-08-07 ENCOUNTER — Inpatient Hospital Stay (HOSPITAL_COMMUNITY)

## 2023-08-07 DIAGNOSIS — M7989 Other specified soft tissue disorders: Secondary | ICD-10-CM | POA: Diagnosis not present

## 2023-08-07 DIAGNOSIS — L4050 Arthropathic psoriasis, unspecified: Secondary | ICD-10-CM | POA: Diagnosis present

## 2023-08-07 DIAGNOSIS — G40909 Epilepsy, unspecified, not intractable, without status epilepticus: Secondary | ICD-10-CM | POA: Diagnosis present

## 2023-08-07 DIAGNOSIS — L03031 Cellulitis of right toe: Secondary | ICD-10-CM | POA: Diagnosis not present

## 2023-08-07 DIAGNOSIS — Z87891 Personal history of nicotine dependence: Secondary | ICD-10-CM | POA: Diagnosis not present

## 2023-08-07 DIAGNOSIS — K219 Gastro-esophageal reflux disease without esophagitis: Secondary | ICD-10-CM | POA: Diagnosis present

## 2023-08-07 DIAGNOSIS — I872 Venous insufficiency (chronic) (peripheral): Secondary | ICD-10-CM | POA: Diagnosis present

## 2023-08-07 DIAGNOSIS — E876 Hypokalemia: Secondary | ICD-10-CM | POA: Diagnosis present

## 2023-08-07 DIAGNOSIS — Z7982 Long term (current) use of aspirin: Secondary | ICD-10-CM | POA: Diagnosis not present

## 2023-08-07 DIAGNOSIS — F419 Anxiety disorder, unspecified: Secondary | ICD-10-CM | POA: Diagnosis present

## 2023-08-07 DIAGNOSIS — L03116 Cellulitis of left lower limb: Secondary | ICD-10-CM | POA: Diagnosis present

## 2023-08-07 DIAGNOSIS — E871 Hypo-osmolality and hyponatremia: Secondary | ICD-10-CM | POA: Diagnosis present

## 2023-08-07 DIAGNOSIS — Z818 Family history of other mental and behavioral disorders: Secondary | ICD-10-CM | POA: Diagnosis not present

## 2023-08-07 DIAGNOSIS — F319 Bipolar disorder, unspecified: Secondary | ICD-10-CM | POA: Diagnosis present

## 2023-08-07 DIAGNOSIS — Z79899 Other long term (current) drug therapy: Secondary | ICD-10-CM | POA: Diagnosis not present

## 2023-08-07 DIAGNOSIS — L0390 Cellulitis, unspecified: Secondary | ICD-10-CM | POA: Diagnosis present

## 2023-08-07 DIAGNOSIS — L03115 Cellulitis of right lower limb: Secondary | ICD-10-CM | POA: Diagnosis present

## 2023-08-07 DIAGNOSIS — F1011 Alcohol abuse, in remission: Secondary | ICD-10-CM | POA: Diagnosis present

## 2023-08-07 DIAGNOSIS — I739 Peripheral vascular disease, unspecified: Secondary | ICD-10-CM | POA: Diagnosis present

## 2023-08-07 DIAGNOSIS — Z888 Allergy status to other drugs, medicaments and biological substances status: Secondary | ICD-10-CM | POA: Diagnosis not present

## 2023-08-07 DIAGNOSIS — Z885 Allergy status to narcotic agent status: Secondary | ICD-10-CM | POA: Diagnosis not present

## 2023-08-07 DIAGNOSIS — I10 Essential (primary) hypertension: Secondary | ICD-10-CM | POA: Diagnosis present

## 2023-08-07 DIAGNOSIS — I480 Paroxysmal atrial fibrillation: Secondary | ICD-10-CM | POA: Diagnosis present

## 2023-08-07 LAB — COMPREHENSIVE METABOLIC PANEL WITH GFR
ALT: 12 U/L (ref 0–44)
AST: 15 U/L (ref 15–41)
Albumin: 2.7 g/dL — ABNORMAL LOW (ref 3.5–5.0)
Alkaline Phosphatase: 77 U/L (ref 38–126)
Anion gap: 9 (ref 5–15)
BUN: 6 mg/dL (ref 6–20)
CO2: 23 mmol/L (ref 22–32)
Calcium: 8 mg/dL — ABNORMAL LOW (ref 8.9–10.3)
Chloride: 94 mmol/L — ABNORMAL LOW (ref 98–111)
Creatinine, Ser: 0.52 mg/dL — ABNORMAL LOW (ref 0.61–1.24)
GFR, Estimated: >60 mL/min (ref >60)
Glucose, Bld: 105 mg/dL — ABNORMAL HIGH (ref 70–99)
Potassium: 3.9 mmol/L (ref 3.5–5.1)
Sodium: 126 mmol/L — ABNORMAL LOW (ref 135–145)
Total Bilirubin: 0.8 mg/dL (ref 0.0–1.2)
Total Protein: 6.1 g/dL — ABNORMAL LOW (ref 6.5–8.1)

## 2023-08-07 LAB — CBC
HCT: 38 % — ABNORMAL LOW (ref 39.0–52.0)
Hemoglobin: 13 g/dL (ref 13.0–17.0)
MCH: 33.2 pg (ref 26.0–34.0)
MCHC: 34.2 g/dL (ref 30.0–36.0)
MCV: 96.9 fL (ref 80.0–100.0)
Platelets: 341 K/uL (ref 150–400)
RBC: 3.92 MIL/uL — ABNORMAL LOW (ref 4.22–5.81)
RDW: 12.3 % (ref 11.5–15.5)
WBC: 11.1 K/uL — ABNORMAL HIGH (ref 4.0–10.5)
nRBC: 0 % (ref 0.0–0.2)

## 2023-08-07 LAB — LACTIC ACID, PLASMA
Lactic Acid, Venous: 1.3 mmol/L (ref 0.5–1.9)
Lactic Acid, Venous: 1.2 mmol/L (ref 0.5–1.9)

## 2023-08-07 LAB — BASIC METABOLIC PANEL WITH GFR
Anion gap: 11 (ref 5–15)
BUN: 5 mg/dL — ABNORMAL LOW (ref 6–20)
CO2: 22 mmol/L (ref 22–32)
Calcium: 8 mg/dL — ABNORMAL LOW (ref 8.9–10.3)
Chloride: 93 mmol/L — ABNORMAL LOW (ref 98–111)
Creatinine, Ser: 0.49 mg/dL — ABNORMAL LOW (ref 0.61–1.24)
GFR, Estimated: >60 mL/min (ref >60)
Glucose, Bld: 128 mg/dL — ABNORMAL HIGH (ref 70–99)
Potassium: 3.9 mmol/L (ref 3.5–5.1)
Sodium: 126 mmol/L — ABNORMAL LOW (ref 135–145)

## 2023-08-07 LAB — CREATININE, SERUM
Creatinine, Ser: 0.59 mg/dL — ABNORMAL LOW (ref 0.61–1.24)
GFR, Estimated: >60 mL/min (ref >60)

## 2023-08-07 LAB — HEMOGLOBIN A1C
Hgb A1c MFr Bld: 4.8 % (ref 4.8–5.6)
Mean Plasma Glucose: 91.06 mg/dL

## 2023-08-07 LAB — C-REACTIVE PROTEIN: CRP: 2.2 mg/dL — ABNORMAL HIGH (ref <1.0)

## 2023-08-07 LAB — SEDIMENTATION RATE: Sed Rate: 17 mm/h — ABNORMAL HIGH (ref 0–16)

## 2023-08-07 MED ORDER — ASPIRIN 81 MG PO TBEC
81.0000 mg | DELAYED_RELEASE_TABLET | Freq: Every day | ORAL | Status: DC
  Administered 2023-08-07 – 2023-08-08 (×2): 81 mg via ORAL
  Filled 2023-08-07 (×2): qty 1

## 2023-08-07 MED ORDER — VANCOMYCIN HCL 1250 MG/250ML IV SOLN
1250.0000 mg | Freq: Two times a day (BID) | INTRAVENOUS | Status: DC
  Administered 2023-08-07 – 2023-08-08 (×3): 1250 mg via INTRAVENOUS
  Filled 2023-08-07 (×4): qty 250

## 2023-08-07 MED ORDER — FOLIC ACID 1 MG PO TABS
1.0000 mg | ORAL_TABLET | Freq: Every day | ORAL | Status: DC
  Administered 2023-08-07 – 2023-08-09 (×3): 1 mg via ORAL
  Filled 2023-08-07 (×3): qty 1

## 2023-08-07 MED ORDER — SODIUM CHLORIDE 0.9 % IV SOLN: INTRAVENOUS | Status: DC

## 2023-08-07 MED ORDER — PIPERACILLIN-TAZOBACTAM 3.375 G IVPB 30 MIN
3.3750 g | Freq: Once | INTRAVENOUS | Status: AC
  Administered 2023-08-07: 3.375 g via INTRAVENOUS
  Filled 2023-08-07: qty 50

## 2023-08-07 MED ORDER — HEPARIN SODIUM (PORCINE) 5000 UNIT/ML IJ SOLN
5000.0000 [IU] | Freq: Three times a day (TID) | INTRAMUSCULAR | Status: DC
  Administered 2023-08-07 – 2023-08-09 (×7): 5000 [IU] via SUBCUTANEOUS
  Filled 2023-08-07 (×7): qty 1

## 2023-08-07 MED ORDER — PIPERACILLIN-TAZOBACTAM 3.375 G IVPB
3.3750 g | Freq: Three times a day (TID) | INTRAVENOUS | Status: DC
  Administered 2023-08-07 – 2023-08-09 (×7): 3.375 g via INTRAVENOUS
  Filled 2023-08-07 (×7): qty 50

## 2023-08-07 MED ORDER — VANCOMYCIN HCL IN DEXTROSE 1-5 GM/200ML-% IV SOLN
1000.0000 mg | Freq: Once | INTRAVENOUS | Status: AC
  Administered 2023-08-07: 1000 mg via INTRAVENOUS
  Filled 2023-08-07: qty 200

## 2023-08-07 MED ORDER — LAMOTRIGINE 100 MG PO TABS
100.0000 mg | ORAL_TABLET | Freq: Two times a day (BID) | ORAL | Status: DC
  Administered 2023-08-07 – 2023-08-09 (×5): 100 mg via ORAL
  Filled 2023-08-07 (×5): qty 1

## 2023-08-07 MED ORDER — LEVETIRACETAM 500 MG PO TABS: 500.0000 mg | ORAL_TABLET | Freq: Two times a day (BID) | ORAL | Status: DC

## 2023-08-07 MED ORDER — ALBUTEROL SULFATE (2.5 MG/3ML) 0.083% IN NEBU: 2.5000 mg | INHALATION_SOLUTION | RESPIRATORY_TRACT | Status: DC | PRN

## 2023-08-07 MED ORDER — HYDROCERIN EX CREA
TOPICAL_CREAM | Freq: Every day | CUTANEOUS | Status: DC
  Filled 2023-08-07: qty 113

## 2023-08-07 NOTE — ED Provider Notes (Signed)
 [place] EMERGENCY DEPARTMENT AT [facility] Provider Note   CSN: [id number] Arrival date & time: 08/06/23  2110     Patient presents with: Cellulitis (PT bib EMS. PT has history of cellulitis has flare up on both legs left leg is weeping.)   Billy Anderson is a 58 y.o. male.   The history is provided by the patient.  Rash Location:  Leg Leg rash location:  R lower leg and L lower leg Quality: draining and painful   Quality comment:  Redness Pain details:    Quality:  Aching   Severity:  Moderate   Onset quality:  Gradual   Timing:  Constant   Progression:  Unchanged Severity:  Severe Timing:  Constant Progression:  Worsening Chronicity:  New Context: not food, not plant contact, not pollen, not pregnancy, not sick contacts and not sun exposure   Relieved by:  Nothing Worsened by:  Nothing Ineffective treatments:  None tried Associated symptoms: no abdominal pain, no URI and not vomiting       Past Medical History:  Diagnosis Date   Arthritis    psoriatic arthritis   Bipolar disorder (HCC)    Dysrhythmia    PAF   GERD (gastroesophageal reflux disease)    Hypertension    Pneumonia    Seizures (HCC)      Prior to Admission medications   Medication Sig Start Date End Date Taking? Authorizing Provider  aspirin  EC 81 MG tablet Take 1 tablet (81 mg total) by mouth daily. Patient taking differently: Take 81 mg by mouth at bedtime. 10/31/21   Love, Sharlet RAMAN, PA-C  Calcium  Carb-Cholecalciferol  (CALCIUM  600+D3 PO) Take 1 tablet by mouth 2 (two) times daily.    [provider]  diltiazem  (CARDIZEM  CD) 180 MG 24 hr capsule TAKE 2 CAPSULES BY MOUTH DAILY 07/26/23   Paseda, Folashade R, FNP  Fexofenadine HCl (ALLEGRA PO) Take 180 mg by mouth daily.    [provider]  folic acid  (FOLVITE ) 1 MG tablet Take 1 tablet (1 mg total) by mouth daily. 04/23/23   Shalhoub, Zachary PARAS, MD  gabapentin  (NEURONTIN ) 100 MG capsule Take 1 capsule (100 mg total)  by mouth 2 (two) times daily. 05/10/23 11/06/23  Paseda, Folashade R, FNP  lamoTRIgine  (LAMICTAL ) 100 MG tablet Take 1 tablet (100 mg total) by mouth 2 (two) times daily. Start this after you've finished the 50 mg twice a day dose. 05/22/23 07/21/23  Perri DELENA Meliton Mickey., MD  lamoTRIgine  (LAMICTAL ) 25 MG tablet Take 2 tablets (50 mg total) by mouth daily for 14 days, THEN 2 tablets (50 mg total) 2 (two) times daily for 14 days. After finishing 50 mg twice a day, transition back to your normal 100 mg twice a day.  Take keppra  while you're titrating to goal.. 04/25/23 05/23/23  Perri DELENA Meliton Mickey., MD  levETIRAcetam  (KEPPRA ) 500 MG tablet Take 1 tablet (500 mg total) by mouth 2 (two) times daily. Take keppra  until your lamictal  has been titrated up to the goal dose.  After you're back to your normal lamictal  dose, you can stop the keppra . 04/24/23 05/24/23  Perri DELENA Meliton Mickey., MD  naltrexone  (DEPADE) 50 MG tablet Take 1 tablet (50 mg total) by mouth daily. 07/20/23   Arfeen, Leni DASEN, MD  OLANZapine  (ZYPREXA ) 20 MG tablet Take 1 tablet (20 mg total) by mouth at bedtime. 07/20/23   Arfeen, Leni DASEN, MD  oxybutynin  (DITROPAN -XL) 10 MG 24 hr tablet TAKE 1 TABLET BY  MOUTH AT BEDTIME 05/18/23   Paseda, Folashade R, FNP  pantoprazole  (PROTONIX ) 40 MG tablet Take 1 tablet (40 mg total) by mouth daily. 03/17/23   Paseda, Folashade R, FNP  sodium chloride  1 g tablet Take 2 tablets (2 g total) by mouth 2 (two) times daily with a meal. Patient taking differently: Take 2 g by mouth 3 (three) times daily with meals. 04/23/23   Shalhoub, Zachary PARAS, MD  thiamine  (VITAMIN B-1) 100 MG tablet Take 1 tablet (100 mg total) by mouth daily. 04/24/23   Shalhoub, Zachary PARAS, MD  tiZANidine  (ZANAFLEX ) 4 MG tablet Take 4 mg by mouth daily as needed for muscle spasms. 01/31/23   [provider]    Allergies: Ativan  [lorazepam ], Roxicodone [oxycodone], Ultram [tramadol], and Vistaril [hydroxyzine]    Review of Systems  Gastrointestinal:   Negative for abdominal pain and vomiting.  Skin:  Positive for color change and rash.  All other systems reviewed and are negative.   Updated Vital Signs BP (!) 153/81   Pulse 80   Temp 98.3 F (36.8 C) (Oral)   Resp 17   Wt 82 kg   SpO2 100%   BMI 24.51 kg/m   Physical Exam Vitals and nursing note reviewed.  Constitutional:      General: He is not in acute distress.    Appearance: He is well-developed. He is not diaphoretic.  HENT:     Head: Normocephalic and atraumatic.     Nose: Nose normal.  Eyes:     Conjunctiva/sclera: Conjunctivae normal.     Pupils: Pupils are equal, round, and reactive to light.  Cardiovascular:     Rate and Rhythm: Normal rate and regular rhythm.     Pulses: Normal pulses.     Heart sounds: Normal heart sounds.  Pulmonary:     Effort: Pulmonary effort is normal.     Breath sounds: Normal breath sounds. No wheezing or rales.  Abdominal:     General: Bowel sounds are normal.     Palpations: Abdomen is soft.     Tenderness: There is no abdominal tenderness. There is no guarding or rebound.  Musculoskeletal:        General: Normal range of motion.     Cervical back: Normal range of motion and neck supple.  Skin:    General: Skin is warm and dry.     Capillary Refill: Capillary refill takes less than 2 seconds.     Findings: Erythema present.      Neurological:     General: No focal deficit present.     Mental Status: He is alert and oriented to person, place, and time.     Deep Tendon Reflexes: Reflexes normal.  Psychiatric:        Mood and Affect: Mood normal.     (all labs ordered are listed, but only abnormal results are displayed) Results for orders placed or performed during the hospital encounter of 08/06/23  Comprehensive metabolic panel   Collection Time: 08/06/23  9:43 PM  Result Value Ref Range   Sodium 124 (L) 135 - 145 mmol/L   Potassium 4.2 3.5 - 5.1 mmol/L   Chloride 88 (L) 98 - 111 mmol/L   CO2 26 22 - 32 mmol/L    Glucose, Bld 100 (H) 70 - 99 mg/dL   BUN <5 (L) 6 - 20 mg/dL   Creatinine, Ser 9.49 (L) 0.61 - 1.24 mg/dL   Calcium  8.7 (L) 8.9 - 10.3 mg/dL   Total Protein 7.3  6.5 - 8.1 g/dL   Albumin 3.3 (L) 3.5 - 5.0 g/dL   AST 21 15 - 41 U/L   ALT 15 0 - 44 U/L   Alkaline Phosphatase 96 38 - 126 U/L   Total Bilirubin 0.7 0.0 - 1.2 mg/dL   GFR, Estimated >39 >39 mL/min   Anion gap 10 5 - 15  CBC with Differential   Collection Time: 08/06/23  9:43 PM  Result Value Ref Range   WBC 11.4 (H) 4.0 - 10.5 K/uL   RBC 4.20 (L) 4.22 - 5.81 MIL/uL   Hemoglobin 14.3 13.0 - 17.0 g/dL   HCT 59.2 60.9 - 47.9 %   MCV 96.9 80.0 - 100.0 fL   MCH 34.0 26.0 - 34.0 pg   MCHC 35.1 30.0 - 36.0 g/dL   RDW 87.7 88.4 - 84.4 %   Platelets 362 150 - 400 K/uL   nRBC 0.0 0.0 - 0.2 %   Neutrophils Relative % 81 %   Neutro Abs 9.3 (H) 1.7 - 7.7 K/uL   Lymphocytes Relative 10 %   Lymphs Abs 1.1 0.7 - 4.0 K/uL   Monocytes Relative 6 %   Monocytes Absolute 0.6 0.1 - 1.0 K/uL   Eosinophils Relative 3 %   Eosinophils Absolute 0.3 0.0 - 0.5 K/uL   Basophils Relative 0 %   Basophils Absolute 0.1 0.0 - 0.1 K/uL   Immature Granulocytes 0 %   Abs Immature Granulocytes 0.05 0.00 - 0.07 K/uL  I-Stat CG4 Lactic Acid   Collection Time: 08/06/23  9:53 PM  Result Value Ref Range   Lactic Acid, Venous 1.0 0.5 - 1.9 mmol/L   No results found.  EKG: None  Radiology: No results found.   Procedures   Medications Ordered in the ED  vancomycin  (VANCOCIN ) IVPB 1000 mg/200 mL premix (has no administration in time range)  piperacillin -tazobactam (ZOSYN ) IVPB 3.375 g (has no administration in time range)  0.9 %  sodium chloride  infusion (has no administration in time range)                                    Medical Decision Making Patient with cellulitis and hyponatremia   Amount and/or Complexity of Data Reviewed External Data Reviewed: notes.    Details: Previous notes reviewed  Labs: ordered.    Details: Sodium low  124, normal potassium 4.2, normal creatinine. White count slight elevation 11.4, normal hemoglobin 14.3, normal platelets.  Blood cultures sent   Risk Prescription drug management. Decision regarding hospitalization.     Final diagnoses:  Cellulitis, unspecified cellulitis site  Hyponatremia  Left leg cellulitis   No signs of systemic illness or infection. The patient is nontoxic-appearing on exam and vital signs are within normal limits.  I have reviewed the triage vital signs and the nursing notes. Pertinent labs & imaging results that were available during my care of the patient were reviewed by me and considered in my medical decision making (see chart for details). After history, exam, and medical workup I feel the patient has been appropriately medically screened and is safe for discharge home. Pertinent diagnoses were discussed with the patient. Patient was given return precautions.    ED Discharge Orders     None          Cayson Kalb, MD 08/07/23 9745

## 2023-08-07 NOTE — Progress Notes (Signed)
  Progress Note   Patient: Billy Anderson FMW:[id number] DOB: 1965-08-02 DOA: 08/06/2023     0 DOS: the patient was seen and examined on 08/07/2023   Brief hospital course: Billy Anderson is a 58 y.o. 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 Patient presents to ED due to episode of recurrent cellulitis. Patient noted lower extremity weeping with increase redness and swelling. Patient notes no fever/chills/ sob/ chest pain.     Assessment and Plan: Recurrent Left lower extremity cellulitis in setting of chronic venostasis  -Continue with vanc /zosyn , de-escalate when data available -f/u with culture  -trend crp/esr  -lower extremity dopplers pending   Hx of ETOH abuse -patient notes in remission - No signs of withdrawal     GERD - Continue ppi    P Atrial fib  -resume cardizem  -resume asa    Seizure d/o -resume  lamictal  /gabapentin     Bipolar disorder -resume home regimen once med rec completed    HTN  -stable    Hyponatremia, improved to 126 from 124 at admission -lower that typical baseline  -repeat labs s/p ivfs in ED  - further work up base on trend    Psoriatic Arthritis  - note he was humira but ran out of this medication  -pt has not taken medication in some time.        Subjective: Feels better but he still complains of left lower extremity pain  Physical Exam: Vitals:   08/07/23 0630 08/07/23 0853 08/07/23 1035 08/07/23 1038  BP: (!) 140/80 (!) 149/71  123/68  Pulse: 78 80  77  Resp: 18 18  18   Temp:  99.7 F (37.6 C)  98.6 F (37 C)  TempSrc:  Oral  Oral  SpO2: 96% 91%  96%  Weight:   84.7 kg   Height:   6' (1.829 m)    Constitutional: Alert, awake, calm, comfortable HEENT: Neck supple Respiratory: clear to auscultation bilaterally, no wheezing, no crackles. Normal respiratory effort.  No accessory muscle use.  Cardiovascular: Regular rate and rhythm, no murmurs / rubs / gallops. No extremity edema. 2+ pedal pulses. No carotid bruits.  Abdomen: no tenderness, no masses palpated. No hepatosplenomegaly. Bowel sounds positive.  Musculoskeletal: no clubbing / cyanosis. No joint deformity upper and lower extremities. Good ROM, no contractures. Normal muscle tone.  Skin: Bilateral lower extremity has redness and swelling below knee diffuse Neurologic: CN 2-12 grossly intact. Sensation intact, DTR normal. Strength 5/5 x all 4 extremities.  Psychiatric: Normal judgment and insight. Alert and oriented x 3. Normal mood.   Data Reviewed:  Sodium 126 potassium 3.9 white count 11.1  Family Communication: No family was available  Disposition: Status is: Inpatient Remains inpatient appropriate because: Ongoing treatment of bilateral lower EXTR cellulitis mostly on the left  Planned Discharge Destination: Home    Time spent: 35 minutes  Author: Nena Rebel, MD 08/07/2023 1:05 PM  For on call review [email or web address].

## 2023-08-07 NOTE — Hospital Course (Signed)
 Billy Anderson is a 58 y.o. 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 Patient presents to ED due to episode of recurrent cellulitis. Patient noted lower extremity weeping with increase redness and swelling. Patient notes no fever/chills/ sob/ chest pain.  Patient was treated with IV antibiotics Zosyn  and vancomycin .  Vancomycin  was discontinued and continued on Zosyn .  Patient's symptoms improved and patient has been plan for discharge home on Augmentin .  Patient also has been arranged home care with nursing for dressing.  Patient verbalized understand instruction and agree with the plan.  Patient will be discharged home today.

## 2023-08-07 NOTE — Consult Note (Signed)
 WOC team consulted for cellulitis B lower legs with weeping. Secure chat to primary team to request photo documentation.   Please note that the [facility] nursing team is utilizing a standardized work plan to manage patient consults. We are triaging consults and will try to see the patients within 48 hours. Wound photos in the patient's chart allow us  to consult on the patient in the most efficient and timely manner.    Thank you,    Powell Bar MSN, RN-BC, [employer]

## 2023-08-07 NOTE — H&P (Signed)
 History and Physical    Billy Anderson FMW:[id number] DOB: 1965/08/07 DOA: 08/06/2023  PCP: Paseda, Folashade R, FNP  Patient coming from: home  I have personally briefly reviewed patient's old medical records in [facility]  Chief Complaint: b/l lower extremity cellulitis   HPI: Billy Anderson is a 58 y.o. 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 Patient presents to ED due to episode of recurrent cellulitis. Patient noted lower extremity weeping with increase redness and swelling. Patient notes no fever/chills/ sob/ chest pain.   ED Course:  Afeb, BP 133/74, hr 74 , rr 17 sat 96%  Labs  Wbc 11.4, hgb 14.3, plt 362,  Na 124 (129), cl 88, bicarb 26, glu 100 cr 0.5,  Tx vanc /zosyn  Review of Systems: As per HPI otherwise 10 point review of systems negative.   Past Medical History:  Diagnosis Date   Arthritis    psoriatic arthritis   Bipolar disorder (HCC)    Dysrhythmia    PAF   GERD (gastroesophageal reflux disease)    Hypertension    Pneumonia    Seizures (HCC)     Past Surgical History:  Procedure Laterality Date   ANTERIOR CERVICAL DECOMP/DISCECTOMY FUSION N/A 03/20/2022   Procedure: Cervical Three-Four, Cervical Four-Five Anterior Cervical Discectomy Fusion;  Surgeon: Gillie Duncans, MD;  Location: [facility] OR;  Service: Neurosurgery;  Laterality: N/A;  RM 20 to follow 3C   HAND SURGERY Left    carpal tunnel release     reports that he has been smoking cigarettes. He does not have any smokeless tobacco history on file. He reports that he does not currently use alcohol after a past usage of about 48.0 standard drinks of alcohol per week. He reports that he does not use drugs.  Allergies  Allergen Reactions   Ativan  [Lorazepam ] Other (See Comments)    Confusion  Hallucinations    Roxicodone [Oxycodone] Itching    Ultram [Tramadol] Other (See Comments)    Seizures    Vistaril [Hydroxyzine] Itching and Other (See Comments)    Sedation Not effective in reducing anxiety    Family History  Problem Relation Age of Onset   Bipolar disorder Mother     Prior to Admission medications   Medication Sig Start Date End Date Taking? Authorizing Provider  aspirin  EC 81 MG tablet Take 1 tablet (81 mg total) by mouth daily. Patient taking differently: Take 81 mg by mouth at bedtime. 10/31/21   Love, Sharlet RAMAN, PA-C  Calcium  Carb-Cholecalciferol  (CALCIUM  600+D3 PO) Take 1 tablet by mouth 2 (two) times daily.    [provider]  diltiazem  (CARDIZEM  CD) 180 MG 24 hr capsule TAKE 2 CAPSULES BY MOUTH DAILY 07/26/23   Paseda, Folashade R, FNP  Fexofenadine HCl (ALLEGRA PO) Take 180 mg by mouth daily.    [provider]  folic acid  (FOLVITE ) 1 MG tablet Take 1 tablet (1 mg total) by mouth daily. 04/23/23   Shalhoub, Zachary PARAS, MD  gabapentin  (NEURONTIN ) 100 MG capsule Take 1 capsule (100 mg total) by mouth 2 (two) times daily. 05/10/23 11/06/23  Paseda, Folashade R, FNP  lamoTRIgine  (LAMICTAL ) 100 MG tablet Take 1 tablet (100 mg total) by mouth 2 (two) times daily. Start this after you've finished the 50 mg twice a day dose. 05/22/23 07/21/23  Perri DELENA Meliton Mickey., MD  lamoTRIgine  (LAMICTAL ) 25  MG tablet Take 2 tablets (50 mg total) by mouth daily for 14 days, THEN 2 tablets (50 mg total) 2 (two) times daily for 14 days. After finishing 50 mg twice a day, transition back to your normal 100 mg twice a day.  Take keppra  while you're titrating to goal.. 04/25/23 05/23/23  Perri DELENA Meliton Mickey., MD  levETIRAcetam  (KEPPRA ) 500 MG tablet Take 1 tablet (500 mg total) by mouth 2 (two) times daily. Take keppra  until your lamictal  has been titrated up to the goal dose.  After you're back to your normal lamictal  dose, you can stop the keppra . 04/24/23 05/24/23  Perri DELENA Meliton Mickey., MD  naltrexone  (DEPADE) 50 MG tablet Take 1 tablet  (50 mg total) by mouth daily. 07/20/23   Arfeen, Leni DASEN, MD  OLANZapine  (ZYPREXA ) 20 MG tablet Take 1 tablet (20 mg total) by mouth at bedtime. 07/20/23   Arfeen, Leni DASEN, MD  oxybutynin  (DITROPAN -XL) 10 MG 24 hr tablet TAKE 1 TABLET BY MOUTH AT BEDTIME 05/18/23   Paseda, Folashade R, FNP  pantoprazole  (PROTONIX ) 40 MG tablet Take 1 tablet (40 mg total) by mouth daily. 03/17/23   Paseda, Folashade R, FNP  sodium chloride  1 g tablet Take 2 tablets (2 g total) by mouth 2 (two) times daily with a meal. Patient taking differently: Take 2 g by mouth 3 (three) times daily with meals. 04/23/23   Shalhoub, Zachary PARAS, MD  thiamine  (VITAMIN B-1) 100 MG tablet Take 1 tablet (100 mg total) by mouth daily. 04/24/23   Shalhoub, Zachary PARAS, MD  tiZANidine  (ZANAFLEX ) 4 MG tablet Take 4 mg by mouth daily as needed for muscle spasms. 01/31/23   [provider]    Physical Exam: Vitals:   08/06/23 2120 08/06/23 2126 08/07/23 0000 08/07/23 0106  BP:  133/73 (!) 153/81   Pulse:  74 80   Resp:  17 17   Temp:  98.3 F (36.8 C)  98.3 F (36.8 C)  TempSrc:  Oral  Oral  SpO2:  96% 100%   Weight: 82 kg       Constitutional: NAD, calm, comfortable Vitals:   08/06/23 2120 08/06/23 2126 08/07/23 0000 08/07/23 0106  BP:  133/73 (!) 153/81   Pulse:  74 80   Resp:  17 17   Temp:  98.3 F (36.8 C)  98.3 F (36.8 C)  TempSrc:  Oral  Oral  SpO2:  96% 100%   Weight: 82 kg      Eyes: PERRL, lids and conjunctivae normal ENMT: Mucous membranes are moist. Posterior pharynx clear of any exudate or lesions.Normal dentition.  Neck: normal, supple, no masses, no thyromegaly Respiratory: clear to auscultation bilaterally, no wheezing, no crackles. Normal respiratory effort. No accessory muscle use.  Cardiovascular: Regular rate and rhythm, no murmurs / rubs / gallops.  + extremity edema. Warm extremities .  Abdomen: no tenderness, no masses palpated. No hepatosplenomegaly. Bowel sounds positive.  Musculoskeletal: no  clubbing / cyanosis. No joint deformity upper and lower extremities. Good ROM, no contractures. Normal muscle tone.  Skin: chronic skin changes on lower extremity , with  swelling,  R>L  as well as redness L>R.  Neurologic: CN ]grossly intact. Sensation intact, . Strength 5/5 in all 4.  Psychiatric: Normal judgment and insight. Alert and oriented x 3. Normal mood.    Labs on Admission: I have personally reviewed following labs and imaging studies  CBC: Recent Labs  Lab 08/06/23 2143  WBC 11.4*  NEUTROABS 9.3*  HGB  14.3  HCT 40.7  MCV 96.9  PLT 362   Basic Metabolic Panel: Recent Labs  Lab 08/06/23 2143  NA 124*  K 4.2  CL 88*  CO2 26  GLUCOSE 100*  BUN <5*  CREATININE 0.50*  CALCIUM  8.7*   GFR: Estimated Creatinine Clearance: 110.5 mL/min (A) (by C-G formula based on SCr of 0.5 mg/dL (L)). Liver Function Tests: Recent Labs  Lab 08/06/23 2143  AST 21  ALT 15  ALKPHOS 96  BILITOT 0.7  PROT 7.3  ALBUMIN 3.3*   No results for input(s): LIPASE, AMYLASE in the last 168 hours. No results for input(s): AMMONIA in the last 168 hours. Coagulation Profile: No results for input(s): INR, PROTIME in the last 168 hours. Cardiac Enzymes: No results for input(s): CKTOTAL, CKMB, CKMBINDEX, TROPONINI in the last 168 hours. BNP (last 3 results) No results for input(s): PROBNP in the last 8760 hours. HbA1C: No results for input(s): HGBA1C in the last 72 hours. CBG: No results for input(s): GLUCAP in the last 168 hours. Lipid Profile: No results for input(s): CHOL, HDL, LDLCALC, TRIG, CHOLHDL, LDLDIRECT in the last 72 hours. Thyroid Function Tests: No results for input(s): TSH, T4TOTAL, FREET4, T3FREE, THYROIDAB in the last 72 hours. Anemia Panel: No results for input(s): VITAMINB12, FOLATE, FERRITIN, TIBC, IRON, RETICCTPCT in the last 72 hours. Urine analysis:    Component Value Date/Time   COLORURINE YELLOW  04/15/2023 2345   APPEARANCEUR HAZY (A) 04/15/2023 2345   LABSPEC 1.010 04/15/2023 2345   PHURINE 6.0 04/15/2023 2345   GLUCOSEU NEGATIVE 04/15/2023 2345   HGBUR NEGATIVE 04/15/2023 2345   BILIRUBINUR NEGATIVE 04/15/2023 2345   KETONESUR 80 (A) 04/15/2023 2345   PROTEINUR NEGATIVE 04/15/2023 2345   NITRITE NEGATIVE 04/15/2023 2345   LEUKOCYTESUR NEGATIVE 04/15/2023 2345    Radiological Exams on Admission: No results found.  EKG: Independently reviewed.   Assessment/Plan  Recurrent Left lower extremity cellulitis in setting of chronic venostasis  -admit to med tele  - continue with vanc /zosyn , can de-escalate as able -f/u with culture data  -trend crp/esr  -lower extremity dopplers   Hx of ETOH abuse -patient notes in remission    GERD -ppi   P Atrial fib  -resume cardizem  -resume asa   Seizure d/o -resume  lamictal  /gabapentin     Bipolar d/o   -resume home regimen once med rec completed   HTN  -stable   Hyponatremia -lower that typical baseline  -repeat labs s/p ivfs in ED  - further work up base on trend   Psoriatic Arthritis  - note he was humira but ran out of this medication  -pt has not taken medication in some time.  DVT prophylaxis: heparin  Code Status: full/ as discussed per patient wishes in event of cardiac arrest  Family Communication:  Disposition Plan: full/ as discussed per patient wishes in event of cardiac arrest  Consults called: wound care Admission status: med tele   Camila DELENA Ned MD [facility]   If 7PM-7AM, please contact night-coverage [email or web address] Password [email or web address]  08/07/2023, 3:13 AM

## 2023-08-07 NOTE — Progress Notes (Signed)
 VASCULAR LAB    Bilateral lower extremity venous duplex has been performed.  See CV proc for preliminary results.   Christon Gallaway, RVT 08/07/2023, 3:32 PM

## 2023-08-07 NOTE — Progress Notes (Signed)
 Pharmacy Antibiotic Note  Billy Anderson is a 58 y.o. male admitted on 08/06/2023 with BLE cellulitis.  Pharmacy has been consulted for Vancomycin  & Zosyn  dosing.  Plan: Zosyn  3.375gm IV Q8h to be infused over 4hrs Vancomycin  1250mg  IV q12h to target AUC 400-550.  Estimated AUC on this regimen = 441. Monitor renal function and cx data  Check Scr daily while on V/Z combination  Weight: 82 kg (180 lb 12.4 oz)  Temp (24hrs), Avg:98.3 F (36.8 C), Min:98.3 F (36.8 C), Max:98.3 F (36.8 C)  Recent Labs  Lab 08/06/23 2143 08/06/23 2153  WBC 11.4*  --   CREATININE 0.50*  --   LATICACIDVEN  --  1.0    Estimated Creatinine Clearance: 110.5 mL/min (A) (by C-G formula based on SCr of 0.5 mg/dL (L)).    Allergies  Allergen Reactions   Ativan  [Lorazepam ] Other (See Comments)    Confusion  Hallucinations    Roxicodone [Oxycodone] Itching   Ultram [Tramadol] Other (See Comments)    Seizures    Vistaril [Hydroxyzine] Itching and Other (See Comments)    Sedation Not effective in reducing anxiety    Antimicrobials this admission: 7/19 Vancomycin  >>  7/19 Zosyn  >>   Dose adjustments this admission:  Microbiology results: 7/19 BCx:   Thank you for allowing pharmacy to be a part of this patient's care.  Rosaline Millet PharmD 08/07/2023 3:47 AM

## 2023-08-07 NOTE — Consult Note (Signed)
 WOC Nurse Consult Note: Reason for Consult: L leg cellulitis  Wound type: full thickness L leg r/t venous insufficiency  Pressure Injury POA: NA  Measurement: see nursing flowsheet  Wound bed: left lateral leg red moist  Drainage (amount, consistency, odor) see nursing flowsheet  Periwound: erythema  Dressing procedure/placement/frequency: Cleanse L lower leg  with Vashe wound cleanser Soila [phone]) intact skin and open wound bed.  Apply Xeroform gauze (Lawson [phone]) to open wound bed daily and cover with ABD pad, secure with Kerlix roll gauze beginning right above toes and ending right below knee.  May apply Ace bandage wrapped in same fashion as Kerlix for light compression.   Will order Eucerin cream for dry skin of R leg and B feet (DO NOT PLACE IN BETWEEN TOES).   POC discussed with bedside nurse. WOC team will not follow. Re-consult if further needs arise.   Thank you,    Powell Bar MSN, RN-BC, [employer]

## 2023-08-07 NOTE — ED Notes (Signed)
 Gave pt coke Paramedic aware

## 2023-08-08 DIAGNOSIS — L03031 Cellulitis of right toe: Secondary | ICD-10-CM | POA: Diagnosis not present

## 2023-08-08 LAB — BASIC METABOLIC PANEL WITH GFR
Anion gap: 10 (ref 5–15)
BUN: <5 mg/dL — ABNORMAL LOW (ref 6–20)
CO2: 22 mmol/L (ref 22–32)
Calcium: 8 mg/dL — ABNORMAL LOW (ref 8.9–10.3)
Chloride: 96 mmol/L — ABNORMAL LOW (ref 98–111)
Creatinine, Ser: 0.47 mg/dL — ABNORMAL LOW (ref 0.61–1.24)
GFR, Estimated: >60 mL/min (ref >60)
Glucose, Bld: 182 mg/dL — ABNORMAL HIGH (ref 70–99)
Potassium: 3.3 mmol/L — ABNORMAL LOW (ref 3.5–5.1)
Sodium: 128 mmol/L — ABNORMAL LOW (ref 135–145)

## 2023-08-08 LAB — CBC
HCT: 35.3 % — ABNORMAL LOW (ref 39.0–52.0)
Hemoglobin: 12.4 g/dL — ABNORMAL LOW (ref 13.0–17.0)
MCH: 34.3 pg — ABNORMAL HIGH (ref 26.0–34.0)
MCHC: 35.1 g/dL (ref 30.0–36.0)
MCV: 97.8 fL (ref 80.0–100.0)
Platelets: 311 K/uL (ref 150–400)
RBC: 3.61 MIL/uL — ABNORMAL LOW (ref 4.22–5.81)
RDW: 12.4 % (ref 11.5–15.5)
WBC: 8.2 K/uL (ref 4.0–10.5)
nRBC: 0 % (ref 0.0–0.2)

## 2023-08-08 LAB — MAGNESIUM: Magnesium: 1.7 mg/dL (ref 1.7–2.4)

## 2023-08-08 LAB — CREATININE, SERUM
Creatinine, Ser: 0.5 mg/dL — ABNORMAL LOW (ref 0.61–1.24)
GFR, Estimated: >60 mL/min (ref >60)

## 2023-08-08 MED ORDER — MAGNESIUM SULFATE 2 GM/50ML IV SOLN
2.0000 g | Freq: Once | INTRAVENOUS | Status: AC
  Administered 2023-08-08: 2 g via INTRAVENOUS
  Filled 2023-08-08: qty 50

## 2023-08-08 MED ORDER — POTASSIUM CHLORIDE 10 MEQ/100ML IV SOLN
10.0000 meq | INTRAVENOUS | Status: AC
  Administered 2023-08-08 (×2): 10 meq via INTRAVENOUS
  Filled 2023-08-08 (×2): qty 100

## 2023-08-08 MED ORDER — POTASSIUM CHLORIDE 20 MEQ PO PACK
20.0000 meq | PACK | Freq: Once | ORAL | Status: AC
  Administered 2023-08-08: 20 meq via ORAL
  Filled 2023-08-08: qty 1

## 2023-08-08 NOTE — Progress Notes (Signed)
  Progress Note   Patient: Billy Anderson FMW:[id number] DOB: Mar 31, 1965 DOA: 08/06/2023     1 DOS: the patient was seen and examined on 08/08/2023   Brief hospital course: Carol Loftin III is a 58 y.o. 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 Patient presents to ED due to episode of recurrent cellulitis. Patient noted lower extremity weeping with increase redness and swelling. Patient notes no fever/chills/ sob/ chest pain.     Assessment and Plan: Recurrent Left lower extremity cellulitis in setting of chronic venostasis  -Continue with zosyn , discontinue vancomycin  as there is no growth also there is no MRSA PCR positive. -f/u with culture  -trend crp/esr  -lower extremity Dopplers did not show DVT on bilateral lower extremity   Hx of ETOH abuse -patient notes in remission - No signs of withdrawal     GERD - Continue ppi    P Atrial fib  -resume cardizem  -resume asa    Seizure d/o -resume  lamictal  /gabapentin     Bipolar disorder -resume home regimen once med rec completed    HTN  -stable    Hyponatremia, improved to 126 from 124 at admission -lower that typical baseline  -repeat labs s/p ivfs in ED  - further work up base on trend    Psoriatic Arthritis  - note he was humira but ran out of this medication  -pt has not taken medication in some time.          Subjective: Feels better denies any fever or chills  Physical Exam: Vitals:   08/07/23 1433 08/07/23 1900 08/07/23 2315 08/08/23 0409  BP: (!) 161/84 134/83 (!) 153/80 (!) 177/86  Pulse: 77 70 67 72  Resp: 18 18 17 19   Temp: 98.3 F (36.8 C) 98.7 F (37.1 C) 98.5 F (36.9 C) 98.5 F (36.9 C)  TempSrc: Oral Oral Oral Oral  SpO2: 95% 95% 97% 94%  Weight:      Height:       Constitutional: Alert, awake, calm, comfortable HEENT: Neck  supple Respiratory: clear to auscultation bilaterally, no wheezing, no crackles. Normal respiratory effort. No accessory muscle use.  Cardiovascular: Regular rate and rhythm, no murmurs / rubs / gallops. No extremity edema. 2+ pedal pulses. No carotid bruits.  Abdomen: no tenderness, no masses palpated. No hepatosplenomegaly. Bowel sounds positive.  Musculoskeletal: no clubbing / cyanosis. No joint deformity upper and lower extremities. Good ROM, no contractures. Normal muscle tone.  Skin: bilateral lower extremity dressing by wound care Neurologic: CN 2-12 grossly intact. Sensation intact, DTR normal. Strength 5/5 x all 4 extremities.  Psychiatric: Normal judgment and insight. Alert and oriented x 3. Normal mood.   Data Reviewed:  Potassium 3.3, WBC 8.2  Family Communication: None available  Disposition: Status is: Inpatient Remains inpatient appropriate because: Remains to be inpatient due to hypokalemia and ongoing treatment of bilateral lower extremity cellulitis  Planned Discharge Destination: Home versus rehab    Time spent: 35 minutes  Author: Nena Rebel, MD 08/08/2023 12:54 PM  For on call review [email or web address].

## 2023-08-09 ENCOUNTER — Other Ambulatory Visit (HOSPITAL_COMMUNITY): Payer: Self-pay

## 2023-08-09 DIAGNOSIS — L03115 Cellulitis of right lower limb: Secondary | ICD-10-CM

## 2023-08-09 LAB — BASIC METABOLIC PANEL WITH GFR
Anion gap: 7 (ref 5–15)
BUN: <5 mg/dL — ABNORMAL LOW (ref 6–20)
CO2: 23 mmol/L (ref 22–32)
Calcium: 8.2 mg/dL — ABNORMAL LOW (ref 8.9–10.3)
Chloride: 98 mmol/L (ref 98–111)
Creatinine, Ser: 0.53 mg/dL — ABNORMAL LOW (ref 0.61–1.24)
GFR, Estimated: >60 mL/min (ref >60)
Glucose, Bld: 108 mg/dL — ABNORMAL HIGH (ref 70–99)
Potassium: 3.8 mmol/L (ref 3.5–5.1)
Sodium: 128 mmol/L — ABNORMAL LOW (ref 135–145)

## 2023-08-09 LAB — MAGNESIUM: Magnesium: 1.9 mg/dL (ref 1.7–2.4)

## 2023-08-09 MED ORDER — AMOXICILLIN-POT CLAVULANATE 875-125 MG PO TABS: 1.0000 | ORAL_TABLET | Freq: Two times a day (BID) | ORAL | Status: DC

## 2023-08-09 MED ORDER — GABAPENTIN 100 MG PO CAPS
100.0000 mg | ORAL_CAPSULE | Freq: Two times a day (BID) | ORAL | Status: DC
  Administered 2023-08-09: 100 mg via ORAL
  Filled 2023-08-09: qty 1

## 2023-08-09 MED ORDER — AMOXICILLIN-POT CLAVULANATE 875-125 MG PO TABS
1.0000 | ORAL_TABLET | Freq: Two times a day (BID) | ORAL | 0 refills | Status: DC
  Filled 2023-08-09: qty 20, 10d supply, fill #0

## 2023-08-09 NOTE — TOC Transition Note (Signed)
 Transition of Care [facility]) - Discharge Note  Patient Details  Name: Billy Anderson MRN: [id number] Date of Birth: 27-Sep-1965  Transition of Care [facility]) CM/SW Contact:  Duwaine GORMAN Aran, LCSW Phone Number: 08/09/2023, 1:01 PM  Clinical Narrative: Patient will need [facility] for wound care at discharge. Patient has used [facility] before and would like to try another agency. Patient agreeable to referral being made to [facility]. CSW made [facility] referral to [facility] with [facility], which was accepted. CSW updated patient. Hospitalist placed HH orders. Care management signing off.  Final next level of care: Home w Home Health Services Barriers to Discharge: Barriers Resolved  Patient Goals and CMS Choice Patient states their goals for this hospitalization and ongoing recovery are:: Get [facility] CMS Medicare.gov Compare Post Acute Care list provided to:: Patient Choice offered to / list presented to : Patient  Discharge Plan and Services Additional resources added to the After Visit Summary for        DME Arranged: N/A DME Agency: NA HH Arranged: RN HH Agency: [facility] Date [facility] Agency Contacted: 08/09/23 Representative spoke with at [facility] Agency: Arna  Social Drivers of Health (SDOH) Interventions SDOH Screenings   Food Insecurity: No Food Insecurity (08/07/2023)  Housing: Low Risk  (08/07/2023)  Transportation Needs: No Transportation Needs (08/07/2023)  Utilities: Not At Risk (08/07/2023)  Depression (PHQ2-9): Medium Risk (05/10/2023)  Financial Resource Strain: Medium Risk (07/24/2020)   Received from [facility]  Tobacco Use: High Risk (08/06/2023)  Health Literacy: Low Risk  (10/02/2020)   Received from [facility]   Readmission Risk Interventions    04/18/2023    3:01 PM 01/21/2023   11:08 AM  Readmission Risk Prevention Plan  Transportation Screening Complete Complete  PCP or Specialist Appt within 5-7 Days Complete Complete  Home Care Screening Complete Complete  Medication Review  (RN CM) Complete Complete

## 2023-08-09 NOTE — Discharge Summary (Signed)
 Physician Discharge Summary   Patient: Billy Anderson MRN: [id number] DOB: Jan 31, 1965  Admit date:     08/06/2023  Discharge date: 08/09/23  Discharge Physician: Nena Rebel   PCP: Paseda, Folashade R, FNP   Recommendations at discharge:   Continue taking antibiotics Continue wound care/home home care  Discharge Diagnoses: Principal Problem:   Cellulitis Active Problems:   Essential hypertension  Resolved Problems:   * No resolved hospital problems. *  Hospital Course: Billy Anderson is a 58 y.o. 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 Patient presents to ED due to episode of recurrent cellulitis. Patient noted lower extremity weeping with increase redness and swelling. Patient notes no fever/chills/ sob/ chest pain.  Patient was treated with IV antibiotics Zosyn  and vancomycin .  Vancomycin  was discontinued and continued on Zosyn .  Patient's symptoms improved and patient has been plan for discharge home on Augmentin .  Patient also has been arranged home care with nursing for dressing.  Patient verbalized understand instruction and agree with the plan.  Patient will be discharged home today.  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  Later vancomycin  was discontinued.  His symptoms improved.  His wound was evaluated and wrapped by wound care.  Patient feels better and will be discharging home on oral antibiotics and wound care at home. Hx of ETOH abuse -patient notes in remission - No signs of withdrawal     GERD - Continue home meds   P Atrial fib  -resume home cardizem  -resume asa    Seizure d/o -resume  lamictal  /gabapentin     Bipolar disorder -resume home regimen once med rec completed    HTN  -stable     Hyponatremia, appears to be chronic due to his medications.  He is asymptomatic.   Psoriatic Arthritis  - Resume home medications       Pain control - [place]  Controlled Substance Reporting System database was reviewed. and patient was instructed, not to drive, operate heavy machinery, perform activities at heights, swimming or participation in water activities or provide baby-sitting services while on Pain, Sleep and Anxiety Medications; until their outpatient Physician has advised to do so again. Also recommended to not to take more than prescribed Pain, Sleep and Anxiety Medications.  Consultants: Wound care Procedures performed: None Disposition: Home Diet recommendation:  Regular diet DISCHARGE MEDICATION: Allergies as of 08/09/2023       Reactions   Ativan  [lorazepam ] Other (See Comments)   Confusion  Hallucinations    Roxicodone [oxycodone] Itching   Ultram [tramadol] Other (See Comments)   Seizures    Vistaril [hydroxyzine] Itching, Other (See Comments)   Sedation Not effective in reducing anxiety        Medication List     TAKE these medications    ALLEGRA PO Take 180 mg by mouth daily.   amoxicillin -clavulanate 875-125 MG tablet Commonly known as: AUGMENTIN  Take 1 tablet by mouth every 12 (twelve) hours.   Aspirin  Low Dose 81 MG tablet Generic drug: aspirin  EC Take 1 tablet (81 mg total) by mouth daily.   CALCIUM  600+D3 PO Take 1 tablet by mouth 2 (two) times daily.   diltiazem  180 MG 24 hr capsule Commonly known as: CARDIZEM  CD TAKE 2 CAPSULES BY MOUTH DAILY   folic acid  1  MG tablet Commonly known as: FOLVITE  Take 1 tablet (1 mg total) by mouth daily.   gabapentin  100 MG capsule Commonly known as: Neurontin  Take 1 capsule (100 mg total) by mouth 2 (two) times daily.   Glucosamine 500 MG Caps Take 1 capsule by mouth daily.   lamoTRIgine  100 MG tablet Commonly known as: LaMICtal  Take 1 tablet (100 mg total) by mouth 2 (two) times  daily. Start this after you've finished the 50 mg twice a day dose.   magnesium  oxide 400 (240 Mg) MG tablet Commonly known as: MAG-OX Take 400 mg by mouth 2 (two) times daily.   MELATIN PO Take 1 tablet by mouth as needed (for sleep).   naltrexone  50 MG tablet Commonly known as: DEPADE Take 1 tablet (50 mg total) by mouth daily.   OLANZapine  20 MG tablet Commonly known as: ZYPREXA  Take 1 tablet (20 mg total) by mouth at bedtime.   oxybutynin  10 MG 24 hr tablet Commonly known as: DITROPAN -XL TAKE 1 TABLET BY MOUTH AT BEDTIME   pantoprazole  40 MG tablet Commonly known as: PROTONIX  Take 1 tablet (40 mg total) by mouth daily.   sodium chloride  1 g tablet Take 2 tablets (2 g total) by mouth 2 (two) times daily with a meal.   SUPER B COMPLEX PO Take 1 tablet by mouth daily.   thiamine  100 MG tablet Commonly known as: VITAMIN B1 Take 1 tablet (100 mg total) by mouth daily.   tiZANidine  4 MG tablet Commonly known as: ZANAFLEX  Take 4 mg by mouth daily as needed for muscle spasms.        Discharge Exam: Filed Weights   08/06/23 2120 08/07/23 1035  Weight: 82 kg 84.7 kg   Constitutional: Alert, awake, calm, comfortable HEENT: Neck supple Respiratory: clear to auscultation bilaterally, no wheezing, no crackles. Normal respiratory effort. No accessory muscle use.  Cardiovascular: Regular rate and rhythm, no murmurs / rubs / gallops. No extremity edema. 2+ pedal pulses. No carotid bruits.  Abdomen: no tenderness, no masses palpated. No hepatosplenomegaly. Bowel sounds positive.  Musculoskeletal: no clubbing / cyanosis. No joint deformity upper and lower extremities. Good ROM, no contractures. Normal muscle tone.  Skin: Bilateral lower extremity cellulitis with dressing for weeping wound Neurologic: CN 2-12 grossly intact. Sensation intact, DTR normal. Strength 5/5 x all 4 extremities.  Psychiatric: Normal judgment and insight. Alert and oriented x 3. Normal mood.     Condition at discharge: stable  The results of significant diagnostics from this hospitalization (including imaging, microbiology, ancillary and laboratory) are listed below for reference.   Imaging Studies: VAS US  LOWER EXTREMITY VENOUS (DVT) Result Date: 08/08/2023  Lower Venous DVT Study Patient Name:  Billy Anderson  Date of Exam:   08/07/2023 Medical Rec #: [id number]         Accession #:    [id number] Date of Birth: 1965-05-23         Patient Gender: M Patient Age:   2 years Exam Location:  [facility] Procedure:      VAS US  LOWER EXTREMITY VENOUS (DVT) Referring Phys: CAMILA NED --------------------------------------------------------------------------------  Indications: Pain, Swelling, Erythema, and Cellulitis of left leg.  Risk Factors: Psoriatic arthritis, patient on Humera but has not had prescription for awhile secondary to price. Limitations: Pitting edema and poor ultrasound/tissue interface. Comparison Study: Prior negative bilateral LEV done 01/20/23 Performing Technologist: Alberta Lis RVS  Examination Guidelines: A complete evaluation includes B-mode imaging, spectral Doppler, color Doppler, and power Doppler as needed of  all accessible portions of each vessel. Bilateral testing is considered an integral part of a complete examination. Limited examinations for reoccurring indications may be performed as noted. The reflux portion of the exam is performed with the patient in reverse Trendelenburg.  +---------+---------------+---------+-----------+----------+--------------+ RIGHT    CompressibilityPhasicitySpontaneityPropertiesThrombus Aging +---------+---------------+---------+-----------+----------+--------------+ CFV      Full           Yes      Yes                                 +---------+---------------+---------+-----------+----------+--------------+ SFJ      Full                                                         +---------+---------------+---------+-----------+----------+--------------+ FV Prox  Full                                                        +---------+---------------+---------+-----------+----------+--------------+ FV Mid   Full           Yes      Yes                                 +---------+---------------+---------+-----------+----------+--------------+ FV DistalFull                                                        +---------+---------------+---------+-----------+----------+--------------+ PFV      Full                                                        +---------+---------------+---------+-----------+----------+--------------+ POP      Full           Yes      Yes                                 +---------+---------------+---------+-----------+----------+--------------+ PTV      Full                                                        +---------+---------------+---------+-----------+----------+--------------+ PERO     Full                                                        +---------+---------------+---------+-----------+----------+--------------+   +---------+---------------+---------+-----------+----------+-------------------+ LEFT     CompressibilityPhasicitySpontaneityPropertiesThrombus Aging      +---------+---------------+---------+-----------+----------+-------------------+  CFV      Full           Yes      Yes                                      +---------+---------------+---------+-----------+----------+-------------------+ SFJ      Full                                                             +---------+---------------+---------+-----------+----------+-------------------+ FV Prox  Full                                                             +---------+---------------+---------+-----------+----------+-------------------+ FV Mid   Full                                                              +---------+---------------+---------+-----------+----------+-------------------+ FV DistalFull           Yes      Yes                                      +---------+---------------+---------+-----------+----------+-------------------+ PFV      Full                                                             +---------+---------------+---------+-----------+----------+-------------------+ POP      Full           Yes      Yes                                      +---------+---------------+---------+-----------+----------+-------------------+ PTV                                                   Not well visualized +---------+---------------+---------+-----------+----------+-------------------+ PERO                                                  Not well visualized +---------+---------------+---------+-----------+----------+-------------------+     Summary: RIGHT: - There is no evidence of deep vein thrombosis in the lower extremity.  - Ultrasound characteristics of enlarged lymph nodes are noted in the groin. Subcutaneous edema noted  LEFT: - There is no evidence  of deep vein thrombosis in the lower extremity. However, portions of this examination were limited- see technologist comments above.  - Ultrasound characteristics of enlarged lymph nodes noted in the groin. Subcutaneous edema noted.  *See table(s) above for measurements and observations. Electronically signed by Fonda Rim on 08/08/2023 at 9:22:24 AM.    Final     Microbiology: Results for orders placed or performed during the hospital encounter of 08/06/23  Blood culture (routine x 2)     Status: None (Preliminary result)   Collection Time: 08/07/23  1:29 AM   Specimen: BLOOD LEFT FOREARM  Result Value Ref Range Status   Specimen Description   Final    BLOOD LEFT FOREARM Performed at [facility], [address] [place], [place] [address]    Special Requests   Final    BOTTLES DRAWN  AEROBIC AND ANAEROBIC Blood Culture adequate volume Performed at [facility], [address], [place], [place] [address]    Culture   Final    NO GROWTH 2 DAYS Performed at [facility], [address] [place], [place] [address]    Report Status PENDING  Incomplete  Blood culture (routine x 2)     Status: None (Preliminary result)   Collection Time: 08/07/23  1:30 AM   Specimen: BLOOD  Result Value Ref Range Status   Specimen Description   Final    BLOOD RIGHT ANTECUBITAL Performed at [facility], [address], [place], [place] [address]    Special Requests   Final    BOTTLES DRAWN AEROBIC AND ANAEROBIC Blood Culture adequate volume Performed at [facility], [address] [place], [place] [address]    Culture   Final    NO GROWTH 2 DAYS Performed at [facility], [address], [place], [place] [address]    Report Status PENDING  Incomplete    Labs: CBC: Recent Labs  Lab 08/06/23 2143 08/07/23 0631 08/08/23 0402  WBC 11.4* 11.1* 8.2  NEUTROABS 9.3*  --   --   HGB 14.3 13.0 12.4*  HCT 40.7 38.0* 35.3*  MCV 96.9 96.9 97.8  PLT 362 341 311   Basic Metabolic Panel: Recent Labs  Lab 08/06/23 2143 08/07/23 0631 08/07/23 1110 08/07/23 1656 08/08/23 0402 08/08/23 0909 08/09/23 0339  NA 124*  --  126* 126*  --  128* 128*  K 4.2  --  3.9 3.9  --  3.3* 3.8  CL 88*  --  93* 94*  --  96* 98  CO2 26  --  22 23  --  22 23  GLUCOSE 100*  --  128* 105*  --  182* 108*  BUN <5*  --  5* 6  --  <5* <5*  CREATININE 0.50*   < > 0.49* 0.52* 0.50* 0.47* 0.53*  CALCIUM  8.7*  --  8.0* 8.0*  --  8.0* 8.2*  MG  --   --   --   --  1.7  --  1.9   < > = values in this interval not displayed.   Liver Function Tests: Recent Labs  Lab 08/06/23 2143 08/07/23 1656  AST 21 15  ALT 15 12  ALKPHOS 96 77  BILITOT 0.7 0.8  PROT 7.3 6.1*  ALBUMIN 3.3* 2.7*   CBG: No results for input(s): GLUCAP in the last 168  hours.  Discharge time spent: greater than 30 minutes.  Signed: Nena Rebel, MD [facility] 08/09/2023

## 2023-08-10 ENCOUNTER — Telehealth: Payer: Self-pay

## 2023-08-10 NOTE — Transitions of Care (Post Inpatient/ED Visit) (Signed)
   08/10/2023  Name: Billy Anderson MRN: [id number] DOB: 28-Aug-1965  Today's TOC FU Call Status: Today's TOC FU Call Status:: Unsuccessful Call (1st Attempt) Unsuccessful Call (1st Attempt) Date: 08/10/23  Attempted to reach the patient regarding the most recent Inpatient/ED visit.  Follow Up Plan: Additional outreach attempts will be made to reach the patient to complete the Transitions of Care (Post Inpatient/ED visit) call.   Arvin Seip RN, BSN, CCM [employer], Population Health Case Manager Phone: [phone]

## 2023-08-11 ENCOUNTER — Telehealth: Payer: Self-pay

## 2023-08-11 ENCOUNTER — Other Ambulatory Visit: Payer: Self-pay | Admitting: Nurse Practitioner

## 2023-08-11 NOTE — Telephone Encounter (Signed)
 Copied from CRM [id number]. Topic: Clinical - Medical Advice >> Aug 11, 2023 10:42 AM Miller H wrote: Reason for CRM: Unable reach patient for home health care//PH [phone]

## 2023-08-11 NOTE — Transitions of Care (Post Inpatient/ED Visit) (Signed)
   08/11/2023  Name: Billy Anderson MRN: [id number] DOB: 02/10/1965  Today's TOC FU Call Status: Today's TOC FU Call Status:: Unsuccessful Call (2nd Attempt) Unsuccessful Call (2nd Attempt) Date: 08/11/23  Attempted to reach the patient regarding the most recent Inpatient/ED visit.  Follow Up Plan: Additional outreach attempts will be made to reach the patient to complete the Transitions of Care (Post Inpatient/ED visit) call.   Arvin Seip RN, BSN, CCM [employer], Population Health Case Manager Phone: [phone]

## 2023-08-12 ENCOUNTER — Telehealth: Payer: Self-pay

## 2023-08-12 LAB — CULTURE, BLOOD (ROUTINE X 2)
Culture: NO GROWTH
Culture: NO GROWTH
Special Requests: ADEQUATE
Special Requests: ADEQUATE

## 2023-08-12 NOTE — Transitions of Care (Post Inpatient/ED Visit) (Signed)
   08/12/2023  Name: Billy Anderson MRN: [id number] DOB: [birth year]  Today's TOC FU Call Status: Today's TOC FU Call Status:: Unsuccessful Call (3rd Attempt) Unsuccessful Call (3rd Attempt) Date: 08/12/23  Attempted to reach the patient regarding the most recent Inpatient/ED visit.  Follow Up Plan: No further outreach attempts will be made at this time. We have been unable to contact the patient.  Arvin Seip RN, BSN, CCM [employer], Population Health Case Manager Phone: [phone]

## 2023-08-13 DIAGNOSIS — I10 Essential (primary) hypertension: Secondary | ICD-10-CM | POA: Diagnosis not present

## 2023-08-13 DIAGNOSIS — G8929 Other chronic pain: Secondary | ICD-10-CM | POA: Diagnosis not present

## 2023-08-13 DIAGNOSIS — M4802 Spinal stenosis, cervical region: Secondary | ICD-10-CM | POA: Diagnosis not present

## 2023-08-13 DIAGNOSIS — I872 Venous insufficiency (chronic) (peripheral): Secondary | ICD-10-CM | POA: Diagnosis not present

## 2023-08-13 DIAGNOSIS — I739 Peripheral vascular disease, unspecified: Secondary | ICD-10-CM | POA: Diagnosis not present

## 2023-08-13 DIAGNOSIS — D539 Nutritional anemia, unspecified: Secondary | ICD-10-CM | POA: Diagnosis not present

## 2023-08-13 DIAGNOSIS — L97822 Non-pressure chronic ulcer of other part of left lower leg with fat layer exposed: Secondary | ICD-10-CM | POA: Diagnosis not present

## 2023-08-13 DIAGNOSIS — I480 Paroxysmal atrial fibrillation: Secondary | ICD-10-CM | POA: Diagnosis not present

## 2023-08-13 DIAGNOSIS — L03116 Cellulitis of left lower limb: Secondary | ICD-10-CM | POA: Diagnosis not present

## 2023-08-16 ENCOUNTER — Encounter (HOSPITAL_COMMUNITY): Payer: Self-pay | Admitting: Psychiatry

## 2023-08-16 ENCOUNTER — Telehealth (HOSPITAL_COMMUNITY): Payer: Self-pay | Admitting: Psychiatry

## 2023-08-16 ENCOUNTER — Telehealth: Payer: Self-pay | Admitting: Neurology

## 2023-08-16 VITALS — Wt 186.0 lb

## 2023-08-16 DIAGNOSIS — F319 Bipolar disorder, unspecified: Secondary | ICD-10-CM

## 2023-08-16 DIAGNOSIS — F1021 Alcohol dependence, in remission: Secondary | ICD-10-CM | POA: Diagnosis not present

## 2023-08-16 MED ORDER — OLANZAPINE 20 MG PO TABS: 20.0000 mg | ORAL_TABLET | Freq: Every day | ORAL | 1 refills | Status: DC

## 2023-08-16 MED ORDER — LAMOTRIGINE 100 MG PO TABS: 100.0000 mg | ORAL_TABLET | Freq: Two times a day (BID) | ORAL | 1 refills | Status: DC

## 2023-08-16 MED ORDER — NALTREXONE HCL 50 MG PO TABS: 50.0000 mg | ORAL_TABLET | Freq: Every day | ORAL | 1 refills | Status: DC

## 2023-08-16 NOTE — Progress Notes (Signed)
 [facility] MD Virtual Progress Note   Patient Location: Home  Provider Location: Home office  I connect with patient by video and verified that I am speaking with correct person by using two identifiers. I discussed the limitations of evaluation and management by telemedicine and the availability of in person appointments. I also discussed with the patient that there may be a patient responsible charge related to this service. The patient expressed understanding and agreed to proceed.  Billy Anderson [id number] 58 y.o.  08/16/2023 9:44 AM  History of Present Illness:  Patient is evaluated by video session.  He was admitted again in the hospital due to cellulitis.  His medicines were adjusted.  He was restarted on Lamictal  and now he is taking only 100 mg even though recommend to take 100 mg twice a day.  His last sodium level was 128.  Patient has not seen neurology in a while.  Sometimes he gets confused and need reassurance from his wife to answer the question.  Denies any mania, psychosis, irritability.  He is taking naltrexone  and claimed to be sober from drinking for past few months.  He is also on olanzapine  which is helping his mood, irritability, anger and mania.  Sometimes he sleeps too much but like to get rest.  Denies any suicidal thoughts or homicidal thoughts.  He is on gabapentin  and he like to increase the dose.  He is taking 100 mg twice a day but he mention he used to take up to 4 times a day which is prescribed by PCP.  Denies any tremors, shakes.  He is taking naltrexone .  He has a history of seizures and he is not sure what was the last seizure but believe more than a month ago.  Patient does not go outside as frequently.  His wife helps him to to take him to the doctor's appointment.  His appetite is okay and his weight is stable.  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  Started drinking at age 13. Multiple rehab  for alcohol addiction. H/O low sodium and seizure. Tried lithium, Seroquel, Vraylar with poor outcome.  Given Celexa  by PCP but stopped.  No history of suicidal attempt.  History of mania with excessive spending and grandiosity.  No history of abuse.    Past Medical History:  Diagnosis Date   Arthritis    psoriatic arthritis   Bipolar disorder (HCC)    Dysrhythmia    PAF   GERD (gastroesophageal reflux disease)    Hypertension    Pneumonia    Seizures (HCC)     Outpatient Encounter Medications as of 08/16/2023  Medication Sig   amoxicillin -clavulanate (AUGMENTIN ) 875-125 MG tablet Take 1 tablet by mouth every 12 (twelve) hours.   aspirin  EC 81 MG tablet Take 1 tablet (81 mg total) by mouth daily.   B Complex-C (SUPER B COMPLEX PO) Take 1 tablet by mouth daily.   Calcium  Carb-Cholecalciferol  (CALCIUM  600+D3 PO) Take 1 tablet by mouth 2 (two) times daily.   diltiazem  (CARDIZEM  CD) 180 MG 24 hr capsule TAKE 2 CAPSULES BY MOUTH DAILY   Fexofenadine HCl (ALLEGRA PO) Take 180 mg by mouth daily.   folic acid  (FOLVITE ) 1 MG tablet Take 1 tablet (1 mg total) by mouth daily.   gabapentin  (NEURONTIN ) 100 MG capsule Take 1 capsule (100 mg total) by mouth 2 (two) times daily.   Glucosamine 500 MG CAPS Take 1 capsule by mouth daily.   lamoTRIgine  (  LAMICTAL ) 100 MG tablet Take 1 tablet (100 mg total) by mouth 2 (two) times daily. Start this after you've finished the 50 mg twice a day dose.   magnesium  oxide (MAG-OX) 400 (240 Mg) MG tablet Take 400 mg by mouth 2 (two) times daily.   Melatonin-Pyridoxine  (MELATIN PO) Take 1 tablet by mouth as needed (for sleep).   naltrexone  (DEPADE) 50 MG tablet Take 1 tablet (50 mg total) by mouth daily.   OLANZapine  (ZYPREXA ) 20 MG tablet Take 1 tablet (20 mg total) by mouth at bedtime.   oxybutynin  (DITROPAN -XL) 10 MG 24 hr tablet TAKE 1 TABLET BY MOUTH AT BEDTIME   pantoprazole  (PROTONIX ) 40 MG tablet TAKE 1 TABLET BY MOUTH DAILY   sodium chloride  1 g tablet Take  2 tablets (2 g total) by mouth 2 (two) times daily with a meal.   thiamine  (VITAMIN B-1) 100 MG tablet Take 1 tablet (100 mg total) by mouth daily.   tiZANidine  (ZANAFLEX ) 4 MG tablet Take 4 mg by mouth daily as needed for muscle spasms.   No facility-administered encounter medications on file as of 08/16/2023.    Recent Results (from the past 2160 hours)  Comprehensive metabolic panel     Status: Abnormal   Collection Time: 08/06/23  9:43 PM  Result Value Ref Range   Sodium 124 (L) 135 - 145 mmol/L   Potassium 4.2 3.5 - 5.1 mmol/L   Chloride 88 (L) 98 - 111 mmol/L   CO2 26 22 - 32 mmol/L   Glucose, Bld 100 (H) 70 - 99 mg/dL    Comment: Glucose reference range applies only to samples taken after fasting for at least 8 hours.   BUN <5 (L) 6 - 20 mg/dL   Creatinine, Ser 9.49 (L) 0.61 - 1.24 mg/dL   Calcium  8.7 (L) 8.9 - 10.3 mg/dL   Total Protein 7.3 6.5 - 8.1 g/dL   Albumin 3.3 (L) 3.5 - 5.0 g/dL   AST 21 15 - 41 U/L   ALT 15 0 - 44 U/L   Alkaline Phosphatase 96 38 - 126 U/L   Total Bilirubin 0.7 0.0 - 1.2 mg/dL   GFR, Estimated >39 >39 mL/min    Comment: (NOTE) Calculated using the CKD-EPI Creatinine Equation (2021)    Anion gap 10 5 - 15    Comment: Performed at [facility], [address], [place], [place] [address]  CBC with Differential     Status: Abnormal   Collection Time: 08/06/23  9:43 PM  Result Value Ref Range   WBC 11.4 (H) 4.0 - 10.5 K/uL   RBC 4.20 (L) 4.22 - 5.81 MIL/uL   Hemoglobin 14.3 13.0 - 17.0 g/dL   HCT 59.2 60.9 - 47.9 %   MCV 96.9 80.0 - 100.0 fL   MCH 34.0 26.0 - 34.0 pg   MCHC 35.1 30.0 - 36.0 g/dL   RDW 87.7 88.4 - 84.4 %   Platelets 362 150 - 400 K/uL   nRBC 0.0 0.0 - 0.2 %   Neutrophils Relative % 81 %   Neutro Abs 9.3 (H) 1.7 - 7.7 K/uL   Lymphocytes Relative 10 %   Lymphs Abs 1.1 0.7 - 4.0 K/uL   Monocytes Relative 6 %   Monocytes Absolute 0.6 0.1 - 1.0 K/uL   Eosinophils Relative 3 %   Eosinophils Absolute 0.3 0.0  - 0.5 K/uL   Basophils Relative 0 %   Basophils Absolute 0.1 0.0 - 0.1 K/uL   Immature Granulocytes  0 %   Abs Immature Granulocytes 0.05 0.00 - 0.07 K/uL    Comment: Performed at [facility], [address] [place], [place] [address]  I-Stat CG4 Lactic Acid     Status: None   Collection Time: 08/06/23  9:53 PM  Result Value Ref Range   Lactic Acid, Venous 1.0 0.5 - 1.9 mmol/L  Blood culture (routine x 2)     Status: None   Collection Time: 08/07/23  1:29 AM   Specimen: BLOOD LEFT FOREARM  Result Value Ref Range   Specimen Description      BLOOD LEFT FOREARM Performed at [facility], [address], [place], [place] [address]    Special Requests      BOTTLES DRAWN AEROBIC AND ANAEROBIC Blood Culture adequate volume Performed at [facility], [address] [place], [place] [address]    Culture      NO GROWTH 5 DAYS Performed at [facility], [address], [place], [place] [address]    Report Status 08/12/2023 FINAL   Blood culture (routine x 2)     Status: None   Collection Time: 08/07/23  1:30 AM   Specimen: BLOOD  Result Value Ref Range   Specimen Description      BLOOD RIGHT ANTECUBITAL Performed at [facility], [address], [place], [place] [address]    Special Requests      BOTTLES DRAWN AEROBIC AND ANAEROBIC Blood Culture adequate volume Performed at [facility], [address], [place], [place] [address]    Culture      NO GROWTH 5 DAYS Performed at [facility], [address] [place], [place] [address]    Report Status 08/12/2023 FINAL   CBC     Status: Abnormal   Collection Time: 08/07/23  6:31 AM  Result Value Ref Range   WBC 11.1 (H) 4.0 - 10.5 K/uL   RBC 3.92 (L) 4.22 - 5.81 MIL/uL   Hemoglobin 13.0 13.0 - 17.0 g/dL   HCT 61.9 (L) 60.9 - 47.9 %   MCV 96.9 80.0 - 100.0 fL   MCH 33.2 26.0 - 34.0 pg   MCHC 34.2 30.0 - 36.0 g/dL   RDW 87.6 88.4 - 84.4  %   Platelets 341 150 - 400 K/uL   nRBC 0.0 0.0 - 0.2 %    Comment: Performed at [facility], [address], [place], [place] [address]  Creatinine, serum     Status: Abnormal   Collection Time: 08/07/23  6:31 AM  Result Value Ref Range   Creatinine, Ser 0.59 (L) 0.61 - 1.24 mg/dL   GFR, Estimated >39 >39 mL/min    Comment: (NOTE) Calculated using the CKD-EPI Creatinine Equation (2021) Performed at [facility], [address] [place], [place] [address]   Hemoglobin A1c     Status: None   Collection Time: 08/07/23  6:31 AM  Result Value Ref Range   Hgb A1c MFr Bld 4.8 4.8 - 5.6 %    Comment: (NOTE) Diagnosis of Diabetes The following HbA1c ranges recommended by the American Diabetes Association (ADA) may be used as an aid in the diagnosis of diabetes mellitus.  Hemoglobin             Suggested A1C NGSP%              Diagnosis  <5.7  Non Diabetic  5.7-6.4                Pre-Diabetic  >6.4                   Diabetic  <7.0                   Glycemic control for                       adults with diabetes.     Mean Plasma Glucose 91.06 mg/dL    Comment: Performed at [facility], [address], [place], [place] [address]  C-reactive protein     Status: Abnormal   Collection Time: 08/07/23  6:31 AM  Result Value Ref Range   CRP 2.2 (H) <1.0 mg/dL    Comment: Performed at [facility], [address], [place], [place] [address]  Sedimentation rate     Status: Abnormal   Collection Time: 08/07/23  6:31 AM  Result Value Ref Range   Sed Rate 17 (H) 0 - 16 mm/hr    Comment: Performed at [facility], [address] [place] [address] [place], [place] [address]  Lactic acid, plasma     Status: None   Collection Time: 08/07/23  6:31 AM  Result Value Ref Range   Lactic Acid, Venous 1.3 0.5 - 1.9 mmol/L    Comment: Performed at [facility], [address], [place], [place] [address]   Lactic acid, plasma     Status: None   Collection Time: 08/07/23 11:10 AM  Result Value Ref Range   Lactic Acid, Venous 1.2 0.5 - 1.9 mmol/L    Comment: Performed at [facility], [address] [place], [place] [address]  Basic metabolic panel     Status: Abnormal   Collection Time: 08/07/23 11:10 AM  Result Value Ref Range   Sodium 126 (L) 135 - 145 mmol/L   Potassium 3.9 3.5 - 5.1 mmol/L   Chloride 93 (L) 98 - 111 mmol/L   CO2 22 22 - 32 mmol/L   Glucose, Bld 128 (H) 70 - 99 mg/dL    Comment: Glucose reference range applies only to samples taken after fasting for at least 8 hours.   BUN 5 (L) 6 - 20 mg/dL   Creatinine, Ser 9.50 (L) 0.61 - 1.24 mg/dL   Calcium  8.0 (L) 8.9 - 10.3 mg/dL   GFR, Estimated >39 >39 mL/min    Comment: (NOTE) Calculated using the CKD-EPI Creatinine Equation (2021)    Anion gap 11 5 - 15    Comment: Performed at [facility], [address] [place], [place] [address]  Comprehensive metabolic panel     Status: Abnormal   Collection Time: 08/07/23  4:56 PM  Result Value Ref Range   Sodium 126 (L) 135 - 145 mmol/L   Potassium 3.9 3.5 - 5.1 mmol/L   Chloride 94 (L) 98 - 111 mmol/L   CO2 23 22 - 32 mmol/L   Glucose, Bld 105 (H) 70 - 99 mg/dL    Comment: Glucose reference range applies only to samples taken after fasting for at least 8 hours.   BUN 6 6 - 20 mg/dL   Creatinine, Ser 9.47 (L) 0.61 - 1.24 mg/dL   Calcium  8.0 (L) 8.9 - 10.3 mg/dL   Total Protein 6.1 (L) 6.5 - 8.1 g/dL   Albumin 2.7 (L) 3.5 - 5.0 g/dL  AST 15 15 - 41 U/L   ALT 12 0 - 44 U/L   Alkaline Phosphatase 77 38 - 126 U/L   Total Bilirubin 0.8 0.0 - 1.2 mg/dL   GFR, Estimated >39 >39 mL/min    Comment: (NOTE) Calculated using the CKD-EPI Creatinine Equation (2021)    Anion gap 9 5 - 15    Comment: Performed at [facility], [address], [place], [place] [address]  Creatinine, serum     Status: Abnormal   Collection  Time: 08/08/23  4:02 AM  Result Value Ref Range   Creatinine, Ser 0.50 (L) 0.61 - 1.24 mg/dL   GFR, Estimated >39 >39 mL/min    Comment: (NOTE) Calculated using the CKD-EPI Creatinine Equation (2021) Performed at [facility], [address] [place], [place] [address]   CBC     Status: Abnormal   Collection Time: 08/08/23  4:02 AM  Result Value Ref Range   WBC 8.2 4.0 - 10.5 K/uL   RBC 3.61 (L) 4.22 - 5.81 MIL/uL   Hemoglobin 12.4 (L) 13.0 - 17.0 g/dL   HCT 64.6 (L) 60.9 - 47.9 %   MCV 97.8 80.0 - 100.0 fL   MCH 34.3 (H) 26.0 - 34.0 pg   MCHC 35.1 30.0 - 36.0 g/dL   RDW 87.5 88.4 - 84.4 %   Platelets 311 150 - 400 K/uL   nRBC 0.0 0.0 - 0.2 %    Comment: Performed at [facility], [address] [place] [address], [place], [place] [address]  Magnesium      Status: None   Collection Time: 08/08/23  4:02 AM  Result Value Ref Range   Magnesium  1.7 1.7 - 2.4 mg/dL    Comment: Performed at [facility], [address] [place], [place] [address]  Basic metabolic panel     Status: Abnormal   Collection Time: 08/08/23  9:09 AM  Result Value Ref Range   Sodium 128 (L) 135 - 145 mmol/L   Potassium 3.3 (L) 3.5 - 5.1 mmol/L   Chloride 96 (L) 98 - 111 mmol/L   CO2 22 22 - 32 mmol/L   Glucose, Bld 182 (H) 70 - 99 mg/dL    Comment: Glucose reference range applies only to samples taken after fasting for at least 8 hours.   BUN <5 (L) 6 - 20 mg/dL   Creatinine, Ser 9.52 (L) 0.61 - 1.24 mg/dL   Calcium  8.0 (L) 8.9 - 10.3 mg/dL   GFR, Estimated >39 >39 mL/min    Comment: (NOTE) Calculated using the CKD-EPI Creatinine Equation (2021)    Anion gap 10 5 - 15    Comment: Performed at [facility], [address] [place], [place] [address]  Basic metabolic panel with GFR     Status: Abnormal   Collection Time: 08/09/23  3:39 AM  Result Value Ref Range   Sodium 128 (L) 135 - 145 mmol/L   Potassium 3.8 3.5 - 5.1 mmol/L   Chloride 98  98 - 111 mmol/L   CO2 23 22 - 32 mmol/L   Glucose, Bld 108 (H) 70 - 99 mg/dL    Comment: Glucose reference range applies only to samples taken after fasting for at least 8 hours.   BUN <5 (L) 6 - 20 mg/dL   Creatinine, Ser 9.46 (L) 0.61 - 1.24 mg/dL   Calcium  8.2 (L) 8.9 - 10.3 mg/dL   GFR, Estimated >39 >39 mL/min    Comment: (NOTE) Calculated  using the CKD-EPI Creatinine Equation (2021)    Anion gap 7 5 - 15    Comment: Performed at [facility], [address] [place], [place] [address]  Magnesium      Status: None   Collection Time: 08/09/23  3:39 AM  Result Value Ref Range   Magnesium  1.9 1.7 - 2.4 mg/dL    Comment: Performed at [facility], [address], [place], [place] [address]     Psychiatric Specialty Exam: Physical Exam  Review of Systems  Weight 186 lb (84.4 kg).There is no height or weight on file to calculate BMI.  General Appearance: Fairly Groomed  Eye Contact:  Fair  Speech:  Slow  Volume:  Decreased  Mood:  Anxious and Dysphoric  Affect:  Congruent  Thought Process:  Descriptions of Associations: Intact  Orientation:  Full (Time, Place, and Person)  Thought Content:  Rumination  Suicidal Thoughts:  No  Homicidal Thoughts:  No  Memory:  Immediate;   Good Recent;   Fair Remote;   Fair  Judgement:  Fair  Insight:  Shallow  Psychomotor Activity:  Decreased  Concentration:  Concentration: Fair and Attention Span: Fair  Recall:  [employer] of Knowledge:  Fair  Language:  Good  Akathisia:  No  Handed:  Right  AIMS (if indicated):     Assets:  Communication Skills Desire for Improvement Housing Social Support  ADL's:  Intact  Cognition:  WNL  Sleep:  10 hrs       05/10/2023    2:44 PM 01/26/2023    2:57 PM 12/24/2021    3:49 PM  Depression screen PHQ 2/9  Decreased Interest 1 0 0  Down, Depressed, Hopeless 1 0 0  PHQ - 2 Score 2 0 0  Altered sleeping 3    Tired, decreased energy 3    Change in appetite  1    Feeling bad or failure about yourself  1    Trouble concentrating 0    Moving slowly or fidgety/restless 0    Suicidal thoughts 0    PHQ-9 Score 10    Difficult doing work/chores Not difficult at all      Assessment/Plan: Bipolar I disorder (HCC) - Plan: naltrexone  (DEPADE) 50 MG tablet, OLANZapine  (ZYPREXA ) 20 MG tablet, lamoTRIgine  (LAMICTAL ) 100 MG tablet  Alcohol use disorder, moderate, in early remission (HCC) - Plan: naltrexone  (DEPADE) 50 MG tablet, OLANZapine  (ZYPREXA ) 20 MG tablet  Patient is 58 year old male with history of hypertension, A-fib, seizure disorder, low sodium, alcohol use disorder, bipolar disorder and history of noncompliance with follow-up.  Reviewed blood work results and collateral information from other providers.  His sodium is still low but kidney functions are stable.  Recommend to take the Lamictal  as prescribed which is 100 mg twice a day and keep olanzapine  20 mg daily.  I emphasized that he need to keep the appointment with neurology and primary care to address his seizure medicine.  He is on gabapentin  but like to go up the dose.  When asked why he did not answer other than he mention he has a history of seizures.  I emphasized that he need to have a visit discussion with his neurology.  He has no appointment coming up and I recommend to schedule appointment as soon as possible.  We will provide one-time Lamictal  100 mg twice a day as in the past he used to take from his neurology Dr. Gregg.  Encouraged to keep the naltrexone  and  olanzapine  to help his mood symptoms and drinking.  Patient claimed to be sober for past few months.  Will follow-up in 2 months.   Follow Up Instructions:     I discussed the assessment and treatment plan with the patient. The patient was provided an opportunity to ask questions and all were answered. The patient agreed with the plan and demonstrated an understanding of the instructions.   The patient was advised to call back  or seek an in-person evaluation if the symptoms worsen or if the condition fails to improve as anticipated.    Collaboration of Care: Other provider involved in patient's care AEB notes are available in epic to review.  Patient/Guardian was advised Release of Information must be obtained prior to any record release in order to collaborate their care with an outside provider. Patient/Guardian was advised if they have not already done so to contact the registration department to sign all necessary forms in order for us  to release information regarding their care.   Consent: Patient/Guardian gives verbal consent for treatment and assignment of benefits for services provided during this visit. Patient/Guardian expressed understanding and agreed to proceed.     Total encounter time 25 minutes which includes face-to-face time, chart reviewed, care coordination, order entry and documentation during this encounter.   Note: This document was prepared by [employer] voice dictation technology and any errors that results from this process are unintentional.    Leni ONEIDA Client, MD 08/16/2023

## 2023-08-16 NOTE — Telephone Encounter (Signed)
 Patient called for refill for gabapentin  (NEURONTIN ) 100 MG capsule. Informed patient Dr. Gregg was not the prescribing physician would need to call Dr. Paseda for refill. Patient verbalized understand.

## 2023-08-17 DIAGNOSIS — I480 Paroxysmal atrial fibrillation: Secondary | ICD-10-CM | POA: Diagnosis not present

## 2023-08-17 DIAGNOSIS — I872 Venous insufficiency (chronic) (peripheral): Secondary | ICD-10-CM | POA: Diagnosis not present

## 2023-08-17 DIAGNOSIS — I739 Peripheral vascular disease, unspecified: Secondary | ICD-10-CM | POA: Diagnosis not present

## 2023-08-17 DIAGNOSIS — M4802 Spinal stenosis, cervical region: Secondary | ICD-10-CM | POA: Diagnosis not present

## 2023-08-17 DIAGNOSIS — L97822 Non-pressure chronic ulcer of other part of left lower leg with fat layer exposed: Secondary | ICD-10-CM | POA: Diagnosis not present

## 2023-08-17 DIAGNOSIS — G8929 Other chronic pain: Secondary | ICD-10-CM | POA: Diagnosis not present

## 2023-08-17 DIAGNOSIS — D539 Nutritional anemia, unspecified: Secondary | ICD-10-CM | POA: Diagnosis not present

## 2023-08-17 DIAGNOSIS — L03116 Cellulitis of left lower limb: Secondary | ICD-10-CM | POA: Diagnosis not present

## 2023-08-17 DIAGNOSIS — I10 Essential (primary) hypertension: Secondary | ICD-10-CM | POA: Diagnosis not present

## 2023-08-20 ENCOUNTER — Telehealth: Payer: Self-pay

## 2023-08-20 DIAGNOSIS — I872 Venous insufficiency (chronic) (peripheral): Secondary | ICD-10-CM | POA: Diagnosis not present

## 2023-08-20 DIAGNOSIS — L97822 Non-pressure chronic ulcer of other part of left lower leg with fat layer exposed: Secondary | ICD-10-CM | POA: Diagnosis not present

## 2023-08-20 DIAGNOSIS — M4802 Spinal stenosis, cervical region: Secondary | ICD-10-CM | POA: Diagnosis not present

## 2023-08-20 DIAGNOSIS — D539 Nutritional anemia, unspecified: Secondary | ICD-10-CM | POA: Diagnosis not present

## 2023-08-20 DIAGNOSIS — I739 Peripheral vascular disease, unspecified: Secondary | ICD-10-CM | POA: Diagnosis not present

## 2023-08-20 DIAGNOSIS — G8929 Other chronic pain: Secondary | ICD-10-CM | POA: Diagnosis not present

## 2023-08-20 DIAGNOSIS — I10 Essential (primary) hypertension: Secondary | ICD-10-CM | POA: Diagnosis not present

## 2023-08-20 DIAGNOSIS — I480 Paroxysmal atrial fibrillation: Secondary | ICD-10-CM | POA: Diagnosis not present

## 2023-08-20 DIAGNOSIS — L03116 Cellulitis of left lower limb: Secondary | ICD-10-CM | POA: Diagnosis not present

## 2023-08-20 NOTE — Telephone Encounter (Signed)
Appt made. KH 

## 2023-08-20 NOTE — Telephone Encounter (Signed)
 Copied from CRM [id number]. Topic: Clinical - Order For Equipment >> Aug 20, 2023  3:39 PM Delon HERO wrote: Reason for CRM: Patient is calling to request a script for Medical Lift Chair and recliner. Request script to be sent to [facility]  Phone [phone] Fax- [phone]

## 2023-08-23 ENCOUNTER — Ambulatory Visit: Payer: Self-pay | Admitting: Nurse Practitioner

## 2023-08-24 DIAGNOSIS — L97822 Non-pressure chronic ulcer of other part of left lower leg with fat layer exposed: Secondary | ICD-10-CM | POA: Diagnosis not present

## 2023-08-24 DIAGNOSIS — I10 Essential (primary) hypertension: Secondary | ICD-10-CM | POA: Diagnosis not present

## 2023-08-24 DIAGNOSIS — L03116 Cellulitis of left lower limb: Secondary | ICD-10-CM | POA: Diagnosis not present

## 2023-08-24 DIAGNOSIS — G8929 Other chronic pain: Secondary | ICD-10-CM | POA: Diagnosis not present

## 2023-08-24 DIAGNOSIS — I872 Venous insufficiency (chronic) (peripheral): Secondary | ICD-10-CM | POA: Diagnosis not present

## 2023-08-24 DIAGNOSIS — I739 Peripheral vascular disease, unspecified: Secondary | ICD-10-CM | POA: Diagnosis not present

## 2023-08-24 DIAGNOSIS — M4802 Spinal stenosis, cervical region: Secondary | ICD-10-CM | POA: Diagnosis not present

## 2023-08-24 DIAGNOSIS — D539 Nutritional anemia, unspecified: Secondary | ICD-10-CM | POA: Diagnosis not present

## 2023-08-24 DIAGNOSIS — I480 Paroxysmal atrial fibrillation: Secondary | ICD-10-CM | POA: Diagnosis not present

## 2023-08-27 DIAGNOSIS — D539 Nutritional anemia, unspecified: Secondary | ICD-10-CM | POA: Diagnosis not present

## 2023-08-27 DIAGNOSIS — I872 Venous insufficiency (chronic) (peripheral): Secondary | ICD-10-CM | POA: Diagnosis not present

## 2023-08-27 DIAGNOSIS — M4802 Spinal stenosis, cervical region: Secondary | ICD-10-CM | POA: Diagnosis not present

## 2023-08-27 DIAGNOSIS — I480 Paroxysmal atrial fibrillation: Secondary | ICD-10-CM | POA: Diagnosis not present

## 2023-08-27 DIAGNOSIS — G8929 Other chronic pain: Secondary | ICD-10-CM | POA: Diagnosis not present

## 2023-08-27 DIAGNOSIS — L97822 Non-pressure chronic ulcer of other part of left lower leg with fat layer exposed: Secondary | ICD-10-CM | POA: Diagnosis not present

## 2023-08-27 DIAGNOSIS — I10 Essential (primary) hypertension: Secondary | ICD-10-CM | POA: Diagnosis not present

## 2023-08-27 DIAGNOSIS — I739 Peripheral vascular disease, unspecified: Secondary | ICD-10-CM | POA: Diagnosis not present

## 2023-08-27 DIAGNOSIS — L03116 Cellulitis of left lower limb: Secondary | ICD-10-CM | POA: Diagnosis not present

## 2023-08-31 DIAGNOSIS — M4802 Spinal stenosis, cervical region: Secondary | ICD-10-CM | POA: Diagnosis not present

## 2023-08-31 DIAGNOSIS — I739 Peripheral vascular disease, unspecified: Secondary | ICD-10-CM | POA: Diagnosis not present

## 2023-08-31 DIAGNOSIS — L03116 Cellulitis of left lower limb: Secondary | ICD-10-CM | POA: Diagnosis not present

## 2023-08-31 DIAGNOSIS — G8929 Other chronic pain: Secondary | ICD-10-CM | POA: Diagnosis not present

## 2023-08-31 DIAGNOSIS — I872 Venous insufficiency (chronic) (peripheral): Secondary | ICD-10-CM | POA: Diagnosis not present

## 2023-08-31 DIAGNOSIS — L97822 Non-pressure chronic ulcer of other part of left lower leg with fat layer exposed: Secondary | ICD-10-CM | POA: Diagnosis not present

## 2023-08-31 DIAGNOSIS — I10 Essential (primary) hypertension: Secondary | ICD-10-CM | POA: Diagnosis not present

## 2023-08-31 DIAGNOSIS — I480 Paroxysmal atrial fibrillation: Secondary | ICD-10-CM | POA: Diagnosis not present

## 2023-08-31 DIAGNOSIS — D539 Nutritional anemia, unspecified: Secondary | ICD-10-CM | POA: Diagnosis not present

## 2023-09-03 DIAGNOSIS — G8929 Other chronic pain: Secondary | ICD-10-CM | POA: Diagnosis not present

## 2023-09-03 DIAGNOSIS — I10 Essential (primary) hypertension: Secondary | ICD-10-CM | POA: Diagnosis not present

## 2023-09-03 DIAGNOSIS — I480 Paroxysmal atrial fibrillation: Secondary | ICD-10-CM | POA: Diagnosis not present

## 2023-09-03 DIAGNOSIS — L97822 Non-pressure chronic ulcer of other part of left lower leg with fat layer exposed: Secondary | ICD-10-CM | POA: Diagnosis not present

## 2023-09-03 DIAGNOSIS — M4802 Spinal stenosis, cervical region: Secondary | ICD-10-CM | POA: Diagnosis not present

## 2023-09-03 DIAGNOSIS — L03116 Cellulitis of left lower limb: Secondary | ICD-10-CM | POA: Diagnosis not present

## 2023-09-03 DIAGNOSIS — D539 Nutritional anemia, unspecified: Secondary | ICD-10-CM | POA: Diagnosis not present

## 2023-09-03 DIAGNOSIS — I739 Peripheral vascular disease, unspecified: Secondary | ICD-10-CM | POA: Diagnosis not present

## 2023-09-03 DIAGNOSIS — I872 Venous insufficiency (chronic) (peripheral): Secondary | ICD-10-CM | POA: Diagnosis not present

## 2023-09-07 ENCOUNTER — Ambulatory Visit: Admitting: Podiatry

## 2023-09-07 DIAGNOSIS — M4802 Spinal stenosis, cervical region: Secondary | ICD-10-CM | POA: Diagnosis not present

## 2023-09-07 DIAGNOSIS — I872 Venous insufficiency (chronic) (peripheral): Secondary | ICD-10-CM | POA: Diagnosis not present

## 2023-09-07 DIAGNOSIS — I10 Essential (primary) hypertension: Secondary | ICD-10-CM | POA: Diagnosis not present

## 2023-09-07 DIAGNOSIS — L97822 Non-pressure chronic ulcer of other part of left lower leg with fat layer exposed: Secondary | ICD-10-CM | POA: Diagnosis not present

## 2023-09-07 DIAGNOSIS — I739 Peripheral vascular disease, unspecified: Secondary | ICD-10-CM | POA: Diagnosis not present

## 2023-09-07 DIAGNOSIS — I480 Paroxysmal atrial fibrillation: Secondary | ICD-10-CM | POA: Diagnosis not present

## 2023-09-07 DIAGNOSIS — G8929 Other chronic pain: Secondary | ICD-10-CM | POA: Diagnosis not present

## 2023-09-07 DIAGNOSIS — L03116 Cellulitis of left lower limb: Secondary | ICD-10-CM | POA: Diagnosis not present

## 2023-09-07 DIAGNOSIS — D539 Nutritional anemia, unspecified: Secondary | ICD-10-CM | POA: Diagnosis not present

## 2023-09-10 ENCOUNTER — Ambulatory Visit: Payer: Self-pay | Admitting: Nurse Practitioner

## 2023-09-10 DIAGNOSIS — I872 Venous insufficiency (chronic) (peripheral): Secondary | ICD-10-CM | POA: Diagnosis not present

## 2023-09-10 DIAGNOSIS — D539 Nutritional anemia, unspecified: Secondary | ICD-10-CM | POA: Diagnosis not present

## 2023-09-10 DIAGNOSIS — I480 Paroxysmal atrial fibrillation: Secondary | ICD-10-CM | POA: Diagnosis not present

## 2023-09-10 DIAGNOSIS — L03116 Cellulitis of left lower limb: Secondary | ICD-10-CM | POA: Diagnosis not present

## 2023-09-10 DIAGNOSIS — I10 Essential (primary) hypertension: Secondary | ICD-10-CM | POA: Diagnosis not present

## 2023-09-10 DIAGNOSIS — G8929 Other chronic pain: Secondary | ICD-10-CM | POA: Diagnosis not present

## 2023-09-10 DIAGNOSIS — M4802 Spinal stenosis, cervical region: Secondary | ICD-10-CM | POA: Diagnosis not present

## 2023-09-10 DIAGNOSIS — I739 Peripheral vascular disease, unspecified: Secondary | ICD-10-CM | POA: Diagnosis not present

## 2023-09-10 DIAGNOSIS — L97822 Non-pressure chronic ulcer of other part of left lower leg with fat layer exposed: Secondary | ICD-10-CM | POA: Diagnosis not present

## 2023-09-14 DIAGNOSIS — L03116 Cellulitis of left lower limb: Secondary | ICD-10-CM | POA: Diagnosis not present

## 2023-09-14 DIAGNOSIS — I10 Essential (primary) hypertension: Secondary | ICD-10-CM | POA: Diagnosis not present

## 2023-09-14 DIAGNOSIS — I739 Peripheral vascular disease, unspecified: Secondary | ICD-10-CM | POA: Diagnosis not present

## 2023-09-14 DIAGNOSIS — I872 Venous insufficiency (chronic) (peripheral): Secondary | ICD-10-CM | POA: Diagnosis not present

## 2023-09-14 DIAGNOSIS — M4802 Spinal stenosis, cervical region: Secondary | ICD-10-CM | POA: Diagnosis not present

## 2023-09-14 DIAGNOSIS — I480 Paroxysmal atrial fibrillation: Secondary | ICD-10-CM | POA: Diagnosis not present

## 2023-09-14 DIAGNOSIS — D539 Nutritional anemia, unspecified: Secondary | ICD-10-CM | POA: Diagnosis not present

## 2023-09-14 DIAGNOSIS — G8929 Other chronic pain: Secondary | ICD-10-CM | POA: Diagnosis not present

## 2023-09-14 DIAGNOSIS — L97822 Non-pressure chronic ulcer of other part of left lower leg with fat layer exposed: Secondary | ICD-10-CM | POA: Diagnosis not present

## 2023-09-15 ENCOUNTER — Ambulatory Visit (INDEPENDENT_AMBULATORY_CARE_PROVIDER_SITE_OTHER): Admitting: Podiatry

## 2023-09-15 ENCOUNTER — Encounter: Payer: Self-pay | Admitting: Podiatry

## 2023-09-15 DIAGNOSIS — D696 Thrombocytopenia, unspecified: Secondary | ICD-10-CM

## 2023-09-15 DIAGNOSIS — M79674 Pain in right toe(s): Secondary | ICD-10-CM | POA: Diagnosis not present

## 2023-09-15 DIAGNOSIS — M79675 Pain in left toe(s): Secondary | ICD-10-CM

## 2023-09-15 DIAGNOSIS — B351 Tinea unguium: Secondary | ICD-10-CM

## 2023-09-15 NOTE — Progress Notes (Signed)
 This patient returns to my office for at risk foot care.  This patient requires this care by a professional since this patient will be at risk due to having thrombocytopenia.  This patient is unable to cut nails himself since the patient cannot reach his nails.These nails are painful walking and wearing shoes.  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  General Appearance  Alert, conversant and in no acute stress.  Vascular  Deferred  Neurologic  Deferred   Nails Thick disfigured discolored nails with subungual debris  from hallux to fifth toes bilaterally. No evidence of bacterial infection or drainage bilaterally.  Orthopedic  No limitations of motion  feet .  No crepitus or effusions noted.  No bony pathology or digital deformities noted.  Skin  normotropic skin with no porokeratosis noted bilaterally.  No signs of infections or ulcers noted.     Onychomycosis  Pain in right toes  Pain in left toes  Consent was obtained for treatment procedures.   Mechanical debridement of nails 1-5  bilaterally performed with a nail nipper.  Filed with dremel without incident.    Return office visit     3 months                 Told patient to return for periodic foot care and evaluation due to potential at risk complications.   Cordella Bold DPM  tomma

## 2023-09-17 DIAGNOSIS — I10 Essential (primary) hypertension: Secondary | ICD-10-CM | POA: Diagnosis not present

## 2023-09-17 DIAGNOSIS — I480 Paroxysmal atrial fibrillation: Secondary | ICD-10-CM | POA: Diagnosis not present

## 2023-09-17 DIAGNOSIS — L03116 Cellulitis of left lower limb: Secondary | ICD-10-CM | POA: Diagnosis not present

## 2023-09-17 DIAGNOSIS — D539 Nutritional anemia, unspecified: Secondary | ICD-10-CM | POA: Diagnosis not present

## 2023-09-17 DIAGNOSIS — G8929 Other chronic pain: Secondary | ICD-10-CM | POA: Diagnosis not present

## 2023-09-17 DIAGNOSIS — L97822 Non-pressure chronic ulcer of other part of left lower leg with fat layer exposed: Secondary | ICD-10-CM | POA: Diagnosis not present

## 2023-09-17 DIAGNOSIS — M4802 Spinal stenosis, cervical region: Secondary | ICD-10-CM | POA: Diagnosis not present

## 2023-09-17 DIAGNOSIS — I872 Venous insufficiency (chronic) (peripheral): Secondary | ICD-10-CM | POA: Diagnosis not present

## 2023-09-17 DIAGNOSIS — I739 Peripheral vascular disease, unspecified: Secondary | ICD-10-CM | POA: Diagnosis not present

## 2023-09-21 DIAGNOSIS — D539 Nutritional anemia, unspecified: Secondary | ICD-10-CM | POA: Diagnosis not present

## 2023-09-21 DIAGNOSIS — I872 Venous insufficiency (chronic) (peripheral): Secondary | ICD-10-CM | POA: Diagnosis not present

## 2023-09-21 DIAGNOSIS — M4802 Spinal stenosis, cervical region: Secondary | ICD-10-CM | POA: Diagnosis not present

## 2023-09-21 DIAGNOSIS — I739 Peripheral vascular disease, unspecified: Secondary | ICD-10-CM | POA: Diagnosis not present

## 2023-09-21 DIAGNOSIS — I10 Essential (primary) hypertension: Secondary | ICD-10-CM | POA: Diagnosis not present

## 2023-09-21 DIAGNOSIS — I480 Paroxysmal atrial fibrillation: Secondary | ICD-10-CM | POA: Diagnosis not present

## 2023-09-21 DIAGNOSIS — L97822 Non-pressure chronic ulcer of other part of left lower leg with fat layer exposed: Secondary | ICD-10-CM | POA: Diagnosis not present

## 2023-09-21 DIAGNOSIS — L03116 Cellulitis of left lower limb: Secondary | ICD-10-CM | POA: Diagnosis not present

## 2023-09-21 DIAGNOSIS — G8929 Other chronic pain: Secondary | ICD-10-CM | POA: Diagnosis not present

## 2023-09-24 DIAGNOSIS — L03116 Cellulitis of left lower limb: Secondary | ICD-10-CM | POA: Diagnosis not present

## 2023-09-24 DIAGNOSIS — D539 Nutritional anemia, unspecified: Secondary | ICD-10-CM | POA: Diagnosis not present

## 2023-09-24 DIAGNOSIS — I739 Peripheral vascular disease, unspecified: Secondary | ICD-10-CM | POA: Diagnosis not present

## 2023-09-24 DIAGNOSIS — I480 Paroxysmal atrial fibrillation: Secondary | ICD-10-CM | POA: Diagnosis not present

## 2023-09-24 DIAGNOSIS — I10 Essential (primary) hypertension: Secondary | ICD-10-CM | POA: Diagnosis not present

## 2023-09-24 DIAGNOSIS — L97822 Non-pressure chronic ulcer of other part of left lower leg with fat layer exposed: Secondary | ICD-10-CM | POA: Diagnosis not present

## 2023-09-24 DIAGNOSIS — M4802 Spinal stenosis, cervical region: Secondary | ICD-10-CM | POA: Diagnosis not present

## 2023-09-24 DIAGNOSIS — G8929 Other chronic pain: Secondary | ICD-10-CM | POA: Diagnosis not present

## 2023-09-24 DIAGNOSIS — I872 Venous insufficiency (chronic) (peripheral): Secondary | ICD-10-CM | POA: Diagnosis not present

## 2023-09-28 DIAGNOSIS — I872 Venous insufficiency (chronic) (peripheral): Secondary | ICD-10-CM | POA: Diagnosis not present

## 2023-09-28 DIAGNOSIS — D539 Nutritional anemia, unspecified: Secondary | ICD-10-CM | POA: Diagnosis not present

## 2023-09-28 DIAGNOSIS — M4802 Spinal stenosis, cervical region: Secondary | ICD-10-CM | POA: Diagnosis not present

## 2023-09-28 DIAGNOSIS — I10 Essential (primary) hypertension: Secondary | ICD-10-CM | POA: Diagnosis not present

## 2023-09-28 DIAGNOSIS — I739 Peripheral vascular disease, unspecified: Secondary | ICD-10-CM | POA: Diagnosis not present

## 2023-09-28 DIAGNOSIS — I480 Paroxysmal atrial fibrillation: Secondary | ICD-10-CM | POA: Diagnosis not present

## 2023-09-28 DIAGNOSIS — G8929 Other chronic pain: Secondary | ICD-10-CM | POA: Diagnosis not present

## 2023-09-28 DIAGNOSIS — L03116 Cellulitis of left lower limb: Secondary | ICD-10-CM | POA: Diagnosis not present

## 2023-09-28 DIAGNOSIS — L97822 Non-pressure chronic ulcer of other part of left lower leg with fat layer exposed: Secondary | ICD-10-CM | POA: Diagnosis not present

## 2023-10-01 DIAGNOSIS — I10 Essential (primary) hypertension: Secondary | ICD-10-CM | POA: Diagnosis not present

## 2023-10-01 DIAGNOSIS — L97822 Non-pressure chronic ulcer of other part of left lower leg with fat layer exposed: Secondary | ICD-10-CM | POA: Diagnosis not present

## 2023-10-01 DIAGNOSIS — M4802 Spinal stenosis, cervical region: Secondary | ICD-10-CM | POA: Diagnosis not present

## 2023-10-01 DIAGNOSIS — I480 Paroxysmal atrial fibrillation: Secondary | ICD-10-CM | POA: Diagnosis not present

## 2023-10-01 DIAGNOSIS — G8929 Other chronic pain: Secondary | ICD-10-CM | POA: Diagnosis not present

## 2023-10-01 DIAGNOSIS — I872 Venous insufficiency (chronic) (peripheral): Secondary | ICD-10-CM | POA: Diagnosis not present

## 2023-10-01 DIAGNOSIS — D539 Nutritional anemia, unspecified: Secondary | ICD-10-CM | POA: Diagnosis not present

## 2023-10-01 DIAGNOSIS — L03116 Cellulitis of left lower limb: Secondary | ICD-10-CM | POA: Diagnosis not present

## 2023-10-01 DIAGNOSIS — I739 Peripheral vascular disease, unspecified: Secondary | ICD-10-CM | POA: Diagnosis not present

## 2023-10-06 DIAGNOSIS — I10 Essential (primary) hypertension: Secondary | ICD-10-CM | POA: Diagnosis not present

## 2023-10-06 DIAGNOSIS — M4802 Spinal stenosis, cervical region: Secondary | ICD-10-CM | POA: Diagnosis not present

## 2023-10-06 DIAGNOSIS — I480 Paroxysmal atrial fibrillation: Secondary | ICD-10-CM | POA: Diagnosis not present

## 2023-10-06 DIAGNOSIS — D539 Nutritional anemia, unspecified: Secondary | ICD-10-CM | POA: Diagnosis not present

## 2023-10-06 DIAGNOSIS — L97822 Non-pressure chronic ulcer of other part of left lower leg with fat layer exposed: Secondary | ICD-10-CM | POA: Diagnosis not present

## 2023-10-06 DIAGNOSIS — I872 Venous insufficiency (chronic) (peripheral): Secondary | ICD-10-CM | POA: Diagnosis not present

## 2023-10-06 DIAGNOSIS — G8929 Other chronic pain: Secondary | ICD-10-CM | POA: Diagnosis not present

## 2023-10-06 DIAGNOSIS — L03116 Cellulitis of left lower limb: Secondary | ICD-10-CM | POA: Diagnosis not present

## 2023-10-06 DIAGNOSIS — I739 Peripheral vascular disease, unspecified: Secondary | ICD-10-CM | POA: Diagnosis not present

## 2023-10-11 ENCOUNTER — Other Ambulatory Visit (HOSPITAL_COMMUNITY): Payer: Self-pay | Admitting: Psychiatry

## 2023-10-11 DIAGNOSIS — L97822 Non-pressure chronic ulcer of other part of left lower leg with fat layer exposed: Secondary | ICD-10-CM | POA: Diagnosis not present

## 2023-10-11 DIAGNOSIS — I480 Paroxysmal atrial fibrillation: Secondary | ICD-10-CM | POA: Diagnosis not present

## 2023-10-11 DIAGNOSIS — I872 Venous insufficiency (chronic) (peripheral): Secondary | ICD-10-CM | POA: Diagnosis not present

## 2023-10-11 DIAGNOSIS — L03116 Cellulitis of left lower limb: Secondary | ICD-10-CM | POA: Diagnosis not present

## 2023-10-11 DIAGNOSIS — I739 Peripheral vascular disease, unspecified: Secondary | ICD-10-CM | POA: Diagnosis not present

## 2023-10-11 DIAGNOSIS — F319 Bipolar disorder, unspecified: Secondary | ICD-10-CM

## 2023-10-11 DIAGNOSIS — M4802 Spinal stenosis, cervical region: Secondary | ICD-10-CM | POA: Diagnosis not present

## 2023-10-11 DIAGNOSIS — F1021 Alcohol dependence, in remission: Secondary | ICD-10-CM

## 2023-10-11 DIAGNOSIS — I10 Essential (primary) hypertension: Secondary | ICD-10-CM | POA: Diagnosis not present

## 2023-10-11 DIAGNOSIS — G8929 Other chronic pain: Secondary | ICD-10-CM | POA: Diagnosis not present

## 2023-10-11 DIAGNOSIS — D539 Nutritional anemia, unspecified: Secondary | ICD-10-CM | POA: Diagnosis not present

## 2023-10-14 DIAGNOSIS — M48061 Spinal stenosis, lumbar region without neurogenic claudication: Secondary | ICD-10-CM | POA: Diagnosis not present

## 2023-10-14 DIAGNOSIS — F411 Generalized anxiety disorder: Secondary | ICD-10-CM | POA: Diagnosis not present

## 2023-10-14 DIAGNOSIS — L03116 Cellulitis of left lower limb: Secondary | ICD-10-CM | POA: Diagnosis not present

## 2023-10-14 DIAGNOSIS — I480 Paroxysmal atrial fibrillation: Secondary | ICD-10-CM | POA: Diagnosis not present

## 2023-10-14 DIAGNOSIS — L97822 Non-pressure chronic ulcer of other part of left lower leg with fat layer exposed: Secondary | ICD-10-CM | POA: Diagnosis not present

## 2023-10-14 DIAGNOSIS — I739 Peripheral vascular disease, unspecified: Secondary | ICD-10-CM | POA: Diagnosis not present

## 2023-10-14 DIAGNOSIS — L97812 Non-pressure chronic ulcer of other part of right lower leg with fat layer exposed: Secondary | ICD-10-CM | POA: Diagnosis not present

## 2023-10-14 DIAGNOSIS — I872 Venous insufficiency (chronic) (peripheral): Secondary | ICD-10-CM | POA: Diagnosis not present

## 2023-10-14 DIAGNOSIS — I10 Essential (primary) hypertension: Secondary | ICD-10-CM | POA: Diagnosis not present

## 2023-10-15 ENCOUNTER — Telehealth: Payer: Self-pay | Admitting: Nurse Practitioner

## 2023-10-15 NOTE — Telephone Encounter (Signed)
 Copied from CRM [id number]. Topic: General - Other >> Oct 11, 2023  2:02 PM Wess RAMAN wrote: Reason for CRM: Kaitlyn from [facility] would like to know if orders were received via fax on 10/08/23.  Order #: [id number]  Callback #: [phone] Fax #: [phone]

## 2023-10-18 ENCOUNTER — Encounter (HOSPITAL_COMMUNITY): Payer: Self-pay | Admitting: *Deleted

## 2023-10-18 ENCOUNTER — Telehealth (HOSPITAL_COMMUNITY): Payer: Self-pay

## 2023-10-18 DIAGNOSIS — F319 Bipolar disorder, unspecified: Secondary | ICD-10-CM

## 2023-10-18 DIAGNOSIS — F1021 Alcohol dependence, in remission: Secondary | ICD-10-CM

## 2023-10-18 MED ORDER — OLANZAPINE 20 MG PO TABS: 20.0000 mg | ORAL_TABLET | Freq: Every day | ORAL | 0 refills | Status: DC

## 2023-10-18 NOTE — Telephone Encounter (Signed)
 Send to [facility]. Please inform the patient. Thanks

## 2023-10-18 NOTE — Telephone Encounter (Signed)
 Patient is requesting a refill on Olanzapine , he has a follow up on 10/7

## 2023-10-19 ENCOUNTER — Telehealth (HOSPITAL_COMMUNITY): Admitting: Psychiatry

## 2023-10-19 ENCOUNTER — Encounter (HOSPITAL_COMMUNITY): Payer: Self-pay | Admitting: Psychiatry

## 2023-10-19 VITALS — Wt 186.0 lb

## 2023-10-19 DIAGNOSIS — L97822 Non-pressure chronic ulcer of other part of left lower leg with fat layer exposed: Secondary | ICD-10-CM | POA: Diagnosis not present

## 2023-10-19 DIAGNOSIS — I872 Venous insufficiency (chronic) (peripheral): Secondary | ICD-10-CM | POA: Diagnosis not present

## 2023-10-19 DIAGNOSIS — F319 Bipolar disorder, unspecified: Secondary | ICD-10-CM | POA: Diagnosis not present

## 2023-10-19 DIAGNOSIS — I10 Essential (primary) hypertension: Secondary | ICD-10-CM | POA: Diagnosis not present

## 2023-10-19 DIAGNOSIS — I739 Peripheral vascular disease, unspecified: Secondary | ICD-10-CM | POA: Diagnosis not present

## 2023-10-19 DIAGNOSIS — F1021 Alcohol dependence, in remission: Secondary | ICD-10-CM | POA: Diagnosis not present

## 2023-10-19 DIAGNOSIS — L03116 Cellulitis of left lower limb: Secondary | ICD-10-CM | POA: Diagnosis not present

## 2023-10-19 DIAGNOSIS — L97812 Non-pressure chronic ulcer of other part of right lower leg with fat layer exposed: Secondary | ICD-10-CM | POA: Diagnosis not present

## 2023-10-19 DIAGNOSIS — I480 Paroxysmal atrial fibrillation: Secondary | ICD-10-CM | POA: Diagnosis not present

## 2023-10-19 DIAGNOSIS — F411 Generalized anxiety disorder: Secondary | ICD-10-CM | POA: Diagnosis not present

## 2023-10-19 DIAGNOSIS — M48061 Spinal stenosis, lumbar region without neurogenic claudication: Secondary | ICD-10-CM | POA: Diagnosis not present

## 2023-10-19 MED ORDER — OLANZAPINE 20 MG PO TABS: 20.0000 mg | ORAL_TABLET | Freq: Every day | ORAL | 2 refills | Status: DC

## 2023-10-19 MED ORDER — LAMOTRIGINE 100 MG PO TABS: 100.0000 mg | ORAL_TABLET | Freq: Two times a day (BID) | ORAL | 2 refills | Status: DC

## 2023-10-19 MED ORDER — NALTREXONE HCL 50 MG PO TABS: 50.0000 mg | ORAL_TABLET | Freq: Every day | ORAL | 2 refills | Status: DC

## 2023-10-19 NOTE — Progress Notes (Signed)
 [facility] MD Virtual Progress Note   Patient Location: Home Provider Location: Home Office  I connect with patient by video and verified that I am speaking with correct person by using two identifiers. I discussed the limitations of evaluation and management by telemedicine and the availability of in person appointments. I also discussed with the patient that there may be a patient responsible charge related to this service. The patient expressed understanding and agreed to proceed.  Billy Anderson [id number] 58 y.o.  10/19/2023 8:57 AM  History of Present Illness:  Patient is evaluated by video session.  He reported no more seizures since the last visit and he remains sober from drinking for 9 months.  He admitted taking the naltrexone  has been helpful.  He sleeps good at night and sometimes he takes nap during the daytime.  He denies any hallucination, paranoia, agitation or mania.  He has not seen neurologist or PCP since the last visit but appointment with PCP is coming tomorrow and with neurologist in December.  He admitted since taking the medication he feel more stable but is still sometime get confused.  His wife take care of the responsibility of medication pillbox.  He does go outside for walk and sometimes it at the back porch.  He does not like going to the public places unless wife is with him and he usually stays in the car.  Reported appetite is okay and not much change in his weight.  He admitted difficulty remembering things and sometimes he has to ask his wife about certain things.  He is not sure how he is taking Lamictal  but believes taking twice a day.  He has no rash, itching shakes.  Denies any illegal substance use.  Denies any hopelessness.  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  Started drinking at age 82. Multiple rehab for alcohol addiction. H/O low sodium and seizure. Tried lithium, Seroquel, Vraylar with  poor outcome.  Given Celexa  by PCP but stopped.  No history of suicidal attempt.  History of mania with excessive spending and grandiosity.  No history of abuse.    Past Medical History:  Diagnosis Date   Arthritis    psoriatic arthritis   Bipolar disorder (HCC)    Dysrhythmia    PAF   GERD (gastroesophageal reflux disease)    Hypertension    Pneumonia    Seizures (HCC)     Outpatient Encounter Medications as of 10/19/2023  Medication Sig   amoxicillin -clavulanate (AUGMENTIN ) 875-125 MG tablet Take 1 tablet by mouth every 12 (twelve) hours.   aspirin  EC 81 MG tablet Take 1 tablet (81 mg total) by mouth daily.   B Complex-C (SUPER B COMPLEX PO) Take 1 tablet by mouth daily.   Calcium  Carb-Cholecalciferol  (CALCIUM  600+D3 PO) Take 1 tablet by mouth 2 (two) times daily.   diltiazem  (CARDIZEM  CD) 180 MG 24 hr capsule TAKE 2 CAPSULES BY MOUTH DAILY   Fexofenadine HCl (ALLEGRA PO) Take 180 mg by mouth daily.   folic acid  (FOLVITE ) 1 MG tablet Take 1 tablet (1 mg total) by mouth daily.   gabapentin  (NEURONTIN ) 100 MG capsule Take 1 capsule (100 mg total) by mouth 2 (two) times daily.   Glucosamine 500 MG CAPS Take 1 capsule by mouth daily.   lamoTRIgine  (LAMICTAL ) 100 MG tablet Take 1 tablet (100 mg total) by mouth 2 (two) times daily.   magnesium  oxide (MAG-OX) 400 (240 Mg) MG tablet Take 400 mg by mouth  2 (two) times daily.   Melatonin-Pyridoxine  (MELATIN PO) Take 1 tablet by mouth as needed (for sleep).   naltrexone  (DEPADE) 50 MG tablet Take 1 tablet (50 mg total) by mouth daily.   OLANZapine  (ZYPREXA ) 20 MG tablet Take 1 tablet (20 mg total) by mouth at bedtime.   oxybutynin  (DITROPAN -XL) 10 MG 24 hr tablet TAKE 1 TABLET BY MOUTH AT BEDTIME   pantoprazole  (PROTONIX ) 40 MG tablet TAKE 1 TABLET BY MOUTH DAILY   sodium chloride  1 g tablet Take 2 tablets (2 g total) by mouth 2 (two) times daily with a meal.   thiamine  (VITAMIN B-1) 100 MG tablet Take 1 tablet (100 mg total) by mouth daily.    tiZANidine  (ZANAFLEX ) 4 MG tablet Take 4 mg by mouth daily as needed for muscle spasms.   No facility-administered encounter medications on file as of 10/19/2023.    Recent Results (from the past 2160 hours)  Comprehensive metabolic panel     Status: Abnormal   Collection Time: 08/06/23  9:43 PM  Result Value Ref Range   Sodium 124 (L) 135 - 145 mmol/L   Potassium 4.2 3.5 - 5.1 mmol/L   Chloride 88 (L) 98 - 111 mmol/L   CO2 26 22 - 32 mmol/L   Glucose, Bld 100 (H) 70 - 99 mg/dL    Comment: Glucose reference range applies only to samples taken after fasting for at least 8 hours.   BUN <5 (L) 6 - 20 mg/dL   Creatinine, Ser 9.49 (L) 0.61 - 1.24 mg/dL   Calcium  8.7 (L) 8.9 - 10.3 mg/dL   Total Protein 7.3 6.5 - 8.1 g/dL   Albumin 3.3 (L) 3.5 - 5.0 g/dL   AST 21 15 - 41 U/L   ALT 15 0 - 44 U/L   Alkaline Phosphatase 96 38 - 126 U/L   Total Bilirubin 0.7 0.0 - 1.2 mg/dL   GFR, Estimated >39 >39 mL/min    Comment: (NOTE) Calculated using the CKD-EPI Creatinine Equation (2021)    Anion gap 10 5 - 15    Comment: Performed at [facility], [address], [place], [place] [address]  CBC with Differential     Status: Abnormal   Collection Time: 08/06/23  9:43 PM  Result Value Ref Range   WBC 11.4 (H) 4.0 - 10.5 K/uL   RBC 4.20 (L) 4.22 - 5.81 MIL/uL   Hemoglobin 14.3 13.0 - 17.0 g/dL   HCT 59.2 60.9 - 47.9 %   MCV 96.9 80.0 - 100.0 fL   MCH 34.0 26.0 - 34.0 pg   MCHC 35.1 30.0 - 36.0 g/dL   RDW 87.7 88.4 - 84.4 %   Platelets 362 150 - 400 K/uL   nRBC 0.0 0.0 - 0.2 %   Neutrophils Relative % 81 %   Neutro Abs 9.3 (H) 1.7 - 7.7 K/uL   Lymphocytes Relative 10 %   Lymphs Abs 1.1 0.7 - 4.0 K/uL   Monocytes Relative 6 %   Monocytes Absolute 0.6 0.1 - 1.0 K/uL   Eosinophils Relative 3 %   Eosinophils Absolute 0.3 0.0 - 0.5 K/uL   Basophils Relative 0 %   Basophils Absolute 0.1 0.0 - 0.1 K/uL   Immature Granulocytes 0 %   Abs Immature Granulocytes 0.05 0.00 -  0.07 K/uL    Comment: Performed at [facility], [address], [place], [place] [address]  I-Stat CG4 Lactic Acid     Status: None   Collection  Time: 08/06/23  9:53 PM  Result Value Ref Range   Lactic Acid, Venous 1.0 0.5 - 1.9 mmol/L  Blood culture (routine x 2)     Status: None   Collection Time: 08/07/23  1:29 AM   Specimen: BLOOD LEFT FOREARM  Result Value Ref Range   Specimen Description      BLOOD LEFT FOREARM Performed at [facility], [address] [place], [place] [address]    Special Requests      BOTTLES DRAWN AEROBIC AND ANAEROBIC Blood Culture adequate volume Performed at [facility], [address], [place], [place] [address]    Culture      NO GROWTH 5 DAYS Performed at [facility], [address] [place], [place] [address]    Report Status 08/12/2023 FINAL   Blood culture (routine x 2)     Status: None   Collection Time: 08/07/23  1:30 AM   Specimen: BLOOD  Result Value Ref Range   Specimen Description      BLOOD RIGHT ANTECUBITAL Performed at [facility], [address], [place], [place] [address]    Special Requests      BOTTLES DRAWN AEROBIC AND ANAEROBIC Blood Culture adequate volume Performed at [facility], [address] [place], [place] [address]    Culture      NO GROWTH 5 DAYS Performed at [facility], [address], [place], [place] [address]    Report Status 08/12/2023 FINAL   CBC     Status: Abnormal   Collection Time: 08/07/23  6:31 AM  Result Value Ref Range   WBC 11.1 (H) 4.0 - 10.5 K/uL   RBC 3.92 (L) 4.22 - 5.81 MIL/uL   Hemoglobin 13.0 13.0 - 17.0 g/dL   HCT 61.9 (L) 60.9 - 47.9 %   MCV 96.9 80.0 - 100.0 fL   MCH 33.2 26.0 - 34.0 pg   MCHC 34.2 30.0 - 36.0 g/dL   RDW 87.6 88.4 - 84.4 %   Platelets 341 150 - 400 K/uL   nRBC 0.0 0.0 - 0.2 %    Comment: Performed at [facility], [address]  [place], [place] [address]  Creatinine, serum     Status: Abnormal   Collection Time: 08/07/23  6:31 AM  Result Value Ref Range   Creatinine, Ser 0.59 (L) 0.61 - 1.24 mg/dL   GFR, Estimated >39 >39 mL/min    Comment: (NOTE) Calculated using the CKD-EPI Creatinine Equation (2021) Performed at [facility], [address], [place], [place] [address]   Hemoglobin A1c     Status: None   Collection Time: 08/07/23  6:31 AM  Result Value Ref Range   Hgb A1c MFr Bld 4.8 4.8 - 5.6 %    Comment: (NOTE) Diagnosis of Diabetes The following HbA1c ranges recommended by the American Diabetes Association (ADA) may be used as an aid in the diagnosis of diabetes mellitus.  Hemoglobin             Suggested A1C NGSP%              Diagnosis  <5.7                   Non Diabetic  5.7-6.4                Pre-Diabetic  >6.4  Diabetic  <7.0                   Glycemic control for                       adults with diabetes.     Mean Plasma Glucose 91.06 mg/dL    Comment: Performed at [facility], [address] [place], [place] [address]  C-reactive protein     Status: Abnormal   Collection Time: 08/07/23  6:31 AM  Result Value Ref Range   CRP 2.2 (H) <1.0 mg/dL    Comment: Performed at [facility], [address] [place], [place] [address]  Sedimentation rate     Status: Abnormal   Collection Time: 08/07/23  6:31 AM  Result Value Ref Range   Sed Rate 17 (H) 0 - 16 mm/hr    Comment: Performed at [facility], [address] [place], [place] [address]  Lactic acid, plasma     Status: None   Collection Time: 08/07/23  6:31 AM  Result Value Ref Range   Lactic Acid, Venous 1.3 0.5 - 1.9 mmol/L    Comment: Performed at [facility], [address], [place], [place] [address]  Lactic acid, plasma     Status: None   Collection Time: 08/07/23 11:10 AM  Result Value Ref Range   Lactic Acid, Venous 1.2 0.5 - 1.9  mmol/L    Comment: Performed at [facility], [address], [place], [place] [address]  Basic metabolic panel     Status: Abnormal   Collection Time: 08/07/23 11:10 AM  Result Value Ref Range   Sodium 126 (L) 135 - 145 mmol/L   Potassium 3.9 3.5 - 5.1 mmol/L   Chloride 93 (L) 98 - 111 mmol/L   CO2 22 22 - 32 mmol/L   Glucose, Bld 128 (H) 70 - 99 mg/dL    Comment: Glucose reference range applies only to samples taken after fasting for at least 8 hours.   BUN 5 (L) 6 - 20 mg/dL   Creatinine, Ser 9.50 (L) 0.61 - 1.24 mg/dL   Calcium  8.0 (L) 8.9 - 10.3 mg/dL   GFR, Estimated >39 >39 mL/min    Comment: (NOTE) Calculated using the CKD-EPI Creatinine Equation (2021)    Anion gap 11 5 - 15    Comment: Performed at [facility], [address], [place], [place] [address]  Comprehensive metabolic panel     Status: Abnormal   Collection Time: 08/07/23  4:56 PM  Result Value Ref Range   Sodium 126 (L) 135 - 145 mmol/L   Potassium 3.9 3.5 - 5.1 mmol/L   Chloride 94 (L) 98 - 111 mmol/L   CO2 23 22 - 32 mmol/L   Glucose, Bld 105 (H) 70 - 99 mg/dL    Comment: Glucose reference range applies only to samples taken after fasting for at least 8 hours.   BUN 6 6 - 20 mg/dL   Creatinine, Ser 9.47 (L) 0.61 - 1.24 mg/dL   Calcium  8.0 (L) 8.9 - 10.3 mg/dL   Total Protein 6.1 (L) 6.5 - 8.1 g/dL   Albumin 2.7 (L) 3.5 - 5.0 g/dL   AST 15 15 - 41 U/L   ALT 12 0 - 44 U/L   Alkaline Phosphatase 77 38 - 126 U/L   Total Bilirubin 0.8 0.0 - 1.2 mg/dL   GFR, Estimated >39 >39  mL/min    Comment: (NOTE) Calculated using the CKD-EPI Creatinine Equation (2021)    Anion gap 9 5 - 15    Comment: Performed at [facility], [address] [place], [place] [address]  Creatinine, serum     Status: Abnormal   Collection Time: 08/08/23  4:02 AM  Result Value Ref Range   Creatinine, Ser 0.50 (L) 0.61 - 1.24 mg/dL   GFR, Estimated >39 >39 mL/min    Comment:  (NOTE) Calculated using the CKD-EPI Creatinine Equation (2021) Performed at [facility], [address], [place], [place] [address]   CBC     Status: Abnormal   Collection Time: 08/08/23  4:02 AM  Result Value Ref Range   WBC 8.2 4.0 - 10.5 K/uL   RBC 3.61 (L) 4.22 - 5.81 MIL/uL   Hemoglobin 12.4 (L) 13.0 - 17.0 g/dL   HCT 64.6 (L) 60.9 - 47.9 %   MCV 97.8 80.0 - 100.0 fL   MCH 34.3 (H) 26.0 - 34.0 pg   MCHC 35.1 30.0 - 36.0 g/dL   RDW 87.5 88.4 - 84.4 %   Platelets 311 150 - 400 K/uL   nRBC 0.0 0.0 - 0.2 %    Comment: Performed at [facility], [address] [place], [place] [address]  Magnesium      Status: None   Collection Time: 08/08/23  4:02 AM  Result Value Ref Range   Magnesium  1.7 1.7 - 2.4 mg/dL    Comment: Performed at [facility], [address] [place], [place] [address]  Basic metabolic panel     Status: Abnormal   Collection Time: 08/08/23  9:09 AM  Result Value Ref Range   Sodium 128 (L) 135 - 145 mmol/L   Potassium 3.3 (L) 3.5 - 5.1 mmol/L   Chloride 96 (L) 98 - 111 mmol/L   CO2 22 22 - 32 mmol/L   Glucose, Bld 182 (H) 70 - 99 mg/dL    Comment: Glucose reference range applies only to samples taken after fasting for at least 8 hours.   BUN <5 (L) 6 - 20 mg/dL   Creatinine, Ser 9.52 (L) 0.61 - 1.24 mg/dL   Calcium  8.0 (L) 8.9 - 10.3 mg/dL   GFR, Estimated >39 >39 mL/min    Comment: (NOTE) Calculated using the CKD-EPI Creatinine Equation (2021)    Anion gap 10 5 - 15    Comment: Performed at [facility], [address] [place], [place] [address]  Basic metabolic panel with GFR     Status: Abnormal   Collection Time: 08/09/23  3:39 AM  Result Value Ref Range   Sodium 128 (L) 135 - 145 mmol/L   Potassium 3.8 3.5 - 5.1 mmol/L   Chloride 98 98 - 111 mmol/L   CO2 23 22 - 32 mmol/L   Glucose, Bld 108 (H) 70 - 99 mg/dL    Comment: Glucose reference range applies only to samples  taken after fasting for at least 8 hours.   BUN <5 (L) 6 - 20 mg/dL   Creatinine, Ser 9.46 (L) 0.61 - 1.24 mg/dL   Calcium  8.2 (L) 8.9 - 10.3 mg/dL   GFR, Estimated >39 >39 mL/min    Comment: (NOTE) Calculated using the CKD-EPI Creatinine Equation (2021)    Anion gap 7 5 - 15    Comment: Performed at [facility], [address], [place], [place] [address]  Magnesium   Status: None   Collection Time: 08/09/23  3:39 AM  Result Value Ref Range   Magnesium  1.9 1.7 - 2.4 mg/dL    Comment: Performed at [facility], [address] [place], [place] [address]     Psychiatric Specialty Exam: Physical Exam  Review of Systems  Weight 186 lb (84.4 kg).There is no height or weight on file to calculate BMI.  General Appearance: Fairly Groomed  Eye Contact:  Fair  Speech:  Slow  Volume:  Decreased  Mood:  Euthymic  Affect:  Congruent  Thought Process:  Descriptions of Associations: Intact  Orientation:  Full (Time, Place, and Person)  Thought Content:  WDL  Suicidal Thoughts:  No  Homicidal Thoughts:  No  Memory:  Immediate;   Fair Recent;   Fair Remote;   Fair  Judgement:  Intact  Insight:  Shallow  Psychomotor Activity:  Decreased  Concentration:  Concentration: Fair and Attention Span: Fair  Recall:  [employer] of Knowledge:  Fair  Language:  Fair  Akathisia:  No  Handed:  Right  AIMS (if indicated):     Assets:  Communication Skills Desire for Improvement Housing Social Support  ADL's:  Intact  Cognition:  WNL  Sleep:  8-9 hrs. Sometimes nap in day time       05/10/2023    2:44 PM 01/26/2023    2:57 PM 12/24/2021    3:49 PM  Depression screen PHQ 2/9  Decreased Interest 1 0 0  Down, Depressed, Hopeless 1 0 0  PHQ - 2 Score 2 0 0  Altered sleeping 3    Tired, decreased energy 3    Change in appetite 1    Feeling bad or failure about yourself  1    Trouble concentrating 0    Moving slowly or fidgety/restless 0    Suicidal  thoughts 0    PHQ-9 Score 10    Difficult doing work/chores Not difficult at all      Assessment/Plan: Bipolar I disorder (HCC) - Plan: lamoTRIgine  (LAMICTAL ) 100 MG tablet, OLANZapine  (ZYPREXA ) 20 MG tablet, naltrexone  (DEPADE) 50 MG tablet  Alcohol use disorder, moderate, in early remission (HCC) - Plan: OLANZapine  (ZYPREXA ) 20 MG tablet, naltrexone  (DEPADE) 50 MG tablet  Patient is 58 year old man with history of hypertension, A-fib, seizure disorder, alcohol use disorder and bipolar disorder.  Since taking the medication he reported symptoms are stable.  He claims to be sober from drinking for 9 months.  Reviewed current medication.  He is on gabapentin  prescribed by neurology.  Lamictal  was also prescribed by neurology but he has not seen in a while and prescription was provided by our office which also helps his mood symptoms.  Encouraged to keep appointment with primary care and neurologist.  He has history of low sodium and he requires blood work.  He like olanzapine  since it helps sleep.  Continue olanzapine  20 mg at bedtime and Lamictal  100 mg twice a day.  Encouraged to have his Lamictal  filled in the future by neurology because primarily he is taking for seizure disorder.  Continue naltrexone  which is helping his alcohol craving.  Patient is not interested in therapy.  Recommend to call back if is any question or any concern.  Follow-up in 3 months.   Follow Up Instructions:     I discussed the assessment and treatment plan with the patient. The patient was provided an opportunity to ask questions and all were answered. The patient agreed with the plan and  demonstrated an understanding of the instructions.   The patient was advised to call back or seek an in-person evaluation if the symptoms worsen or if the condition fails to improve as anticipated.    Collaboration of Care: Other provider involved in patient's care AEB notes are available in epic to review  Patient/Guardian was  advised Release of Information must be obtained prior to any record release in order to collaborate their care with an outside provider. Patient/Guardian was advised if they have not already done so to contact the registration department to sign all necessary forms in order for us  to release information regarding their care.   Consent: Patient/Guardian gives verbal consent for treatment and assignment of benefits for services provided during this visit. Patient/Guardian expressed understanding and agreed to proceed.     Total encounter time 18 minutes which includes face-to-face time, chart reviewed, care coordination, order entry and documentation during this encounter.   Note: This document was prepared by [employer] voice dictation technology and any errors that results from this process are unintentional.    Leni ONEIDA Client, MD 10/19/2023

## 2023-10-20 ENCOUNTER — Ambulatory Visit: Payer: Self-pay | Admitting: Nurse Practitioner

## 2023-10-22 DIAGNOSIS — L03116 Cellulitis of left lower limb: Secondary | ICD-10-CM | POA: Diagnosis not present

## 2023-10-22 DIAGNOSIS — L97812 Non-pressure chronic ulcer of other part of right lower leg with fat layer exposed: Secondary | ICD-10-CM | POA: Diagnosis not present

## 2023-10-22 DIAGNOSIS — I872 Venous insufficiency (chronic) (peripheral): Secondary | ICD-10-CM | POA: Diagnosis not present

## 2023-10-22 DIAGNOSIS — I480 Paroxysmal atrial fibrillation: Secondary | ICD-10-CM | POA: Diagnosis not present

## 2023-10-22 DIAGNOSIS — L97822 Non-pressure chronic ulcer of other part of left lower leg with fat layer exposed: Secondary | ICD-10-CM | POA: Diagnosis not present

## 2023-10-22 DIAGNOSIS — F411 Generalized anxiety disorder: Secondary | ICD-10-CM | POA: Diagnosis not present

## 2023-10-22 DIAGNOSIS — I739 Peripheral vascular disease, unspecified: Secondary | ICD-10-CM | POA: Diagnosis not present

## 2023-10-22 DIAGNOSIS — M48061 Spinal stenosis, lumbar region without neurogenic claudication: Secondary | ICD-10-CM | POA: Diagnosis not present

## 2023-10-22 DIAGNOSIS — I10 Essential (primary) hypertension: Secondary | ICD-10-CM | POA: Diagnosis not present

## 2023-10-26 DIAGNOSIS — I480 Paroxysmal atrial fibrillation: Secondary | ICD-10-CM | POA: Diagnosis not present

## 2023-10-26 DIAGNOSIS — L03116 Cellulitis of left lower limb: Secondary | ICD-10-CM | POA: Diagnosis not present

## 2023-10-26 DIAGNOSIS — I10 Essential (primary) hypertension: Secondary | ICD-10-CM | POA: Diagnosis not present

## 2023-10-26 DIAGNOSIS — L97812 Non-pressure chronic ulcer of other part of right lower leg with fat layer exposed: Secondary | ICD-10-CM | POA: Diagnosis not present

## 2023-10-26 DIAGNOSIS — F411 Generalized anxiety disorder: Secondary | ICD-10-CM | POA: Diagnosis not present

## 2023-10-26 DIAGNOSIS — I739 Peripheral vascular disease, unspecified: Secondary | ICD-10-CM | POA: Diagnosis not present

## 2023-10-26 DIAGNOSIS — L97822 Non-pressure chronic ulcer of other part of left lower leg with fat layer exposed: Secondary | ICD-10-CM | POA: Diagnosis not present

## 2023-10-26 DIAGNOSIS — M48061 Spinal stenosis, lumbar region without neurogenic claudication: Secondary | ICD-10-CM | POA: Diagnosis not present

## 2023-10-26 DIAGNOSIS — I872 Venous insufficiency (chronic) (peripheral): Secondary | ICD-10-CM | POA: Diagnosis not present

## 2023-11-02 DIAGNOSIS — L03116 Cellulitis of left lower limb: Secondary | ICD-10-CM | POA: Diagnosis not present

## 2023-11-02 DIAGNOSIS — M48061 Spinal stenosis, lumbar region without neurogenic claudication: Secondary | ICD-10-CM | POA: Diagnosis not present

## 2023-11-02 DIAGNOSIS — I739 Peripheral vascular disease, unspecified: Secondary | ICD-10-CM | POA: Diagnosis not present

## 2023-11-02 DIAGNOSIS — I872 Venous insufficiency (chronic) (peripheral): Secondary | ICD-10-CM | POA: Diagnosis not present

## 2023-11-02 DIAGNOSIS — I480 Paroxysmal atrial fibrillation: Secondary | ICD-10-CM | POA: Diagnosis not present

## 2023-11-02 DIAGNOSIS — F411 Generalized anxiety disorder: Secondary | ICD-10-CM | POA: Diagnosis not present

## 2023-11-02 DIAGNOSIS — L97812 Non-pressure chronic ulcer of other part of right lower leg with fat layer exposed: Secondary | ICD-10-CM | POA: Diagnosis not present

## 2023-11-02 DIAGNOSIS — L97822 Non-pressure chronic ulcer of other part of left lower leg with fat layer exposed: Secondary | ICD-10-CM | POA: Diagnosis not present

## 2023-11-02 DIAGNOSIS — I10 Essential (primary) hypertension: Secondary | ICD-10-CM | POA: Diagnosis not present

## 2023-11-05 DIAGNOSIS — M48061 Spinal stenosis, lumbar region without neurogenic claudication: Secondary | ICD-10-CM | POA: Diagnosis not present

## 2023-11-05 DIAGNOSIS — L03116 Cellulitis of left lower limb: Secondary | ICD-10-CM | POA: Diagnosis not present

## 2023-11-05 DIAGNOSIS — L97812 Non-pressure chronic ulcer of other part of right lower leg with fat layer exposed: Secondary | ICD-10-CM | POA: Diagnosis not present

## 2023-11-05 DIAGNOSIS — L97822 Non-pressure chronic ulcer of other part of left lower leg with fat layer exposed: Secondary | ICD-10-CM | POA: Diagnosis not present

## 2023-11-05 DIAGNOSIS — F411 Generalized anxiety disorder: Secondary | ICD-10-CM | POA: Diagnosis not present

## 2023-11-05 DIAGNOSIS — I872 Venous insufficiency (chronic) (peripheral): Secondary | ICD-10-CM | POA: Diagnosis not present

## 2023-11-05 DIAGNOSIS — I480 Paroxysmal atrial fibrillation: Secondary | ICD-10-CM | POA: Diagnosis not present

## 2023-11-05 DIAGNOSIS — I739 Peripheral vascular disease, unspecified: Secondary | ICD-10-CM | POA: Diagnosis not present

## 2023-11-05 DIAGNOSIS — I10 Essential (primary) hypertension: Secondary | ICD-10-CM | POA: Diagnosis not present

## 2023-11-09 DIAGNOSIS — I872 Venous insufficiency (chronic) (peripheral): Secondary | ICD-10-CM | POA: Diagnosis not present

## 2023-11-09 DIAGNOSIS — I480 Paroxysmal atrial fibrillation: Secondary | ICD-10-CM | POA: Diagnosis not present

## 2023-11-09 DIAGNOSIS — M48061 Spinal stenosis, lumbar region without neurogenic claudication: Secondary | ICD-10-CM | POA: Diagnosis not present

## 2023-11-09 DIAGNOSIS — I739 Peripheral vascular disease, unspecified: Secondary | ICD-10-CM | POA: Diagnosis not present

## 2023-11-09 DIAGNOSIS — I10 Essential (primary) hypertension: Secondary | ICD-10-CM | POA: Diagnosis not present

## 2023-11-09 DIAGNOSIS — L97812 Non-pressure chronic ulcer of other part of right lower leg with fat layer exposed: Secondary | ICD-10-CM | POA: Diagnosis not present

## 2023-11-09 DIAGNOSIS — L97822 Non-pressure chronic ulcer of other part of left lower leg with fat layer exposed: Secondary | ICD-10-CM | POA: Diagnosis not present

## 2023-11-09 DIAGNOSIS — F411 Generalized anxiety disorder: Secondary | ICD-10-CM | POA: Diagnosis not present

## 2023-11-09 DIAGNOSIS — L03116 Cellulitis of left lower limb: Secondary | ICD-10-CM | POA: Diagnosis not present

## 2023-11-11 ENCOUNTER — Ambulatory Visit: Admitting: Neurology

## 2023-11-12 DIAGNOSIS — I10 Essential (primary) hypertension: Secondary | ICD-10-CM | POA: Diagnosis not present

## 2023-11-12 DIAGNOSIS — F411 Generalized anxiety disorder: Secondary | ICD-10-CM | POA: Diagnosis not present

## 2023-11-12 DIAGNOSIS — L03116 Cellulitis of left lower limb: Secondary | ICD-10-CM | POA: Diagnosis not present

## 2023-11-12 DIAGNOSIS — I480 Paroxysmal atrial fibrillation: Secondary | ICD-10-CM | POA: Diagnosis not present

## 2023-11-12 DIAGNOSIS — L97822 Non-pressure chronic ulcer of other part of left lower leg with fat layer exposed: Secondary | ICD-10-CM | POA: Diagnosis not present

## 2023-11-12 DIAGNOSIS — M48061 Spinal stenosis, lumbar region without neurogenic claudication: Secondary | ICD-10-CM | POA: Diagnosis not present

## 2023-11-12 DIAGNOSIS — L97812 Non-pressure chronic ulcer of other part of right lower leg with fat layer exposed: Secondary | ICD-10-CM | POA: Diagnosis not present

## 2023-11-12 DIAGNOSIS — I872 Venous insufficiency (chronic) (peripheral): Secondary | ICD-10-CM | POA: Diagnosis not present

## 2023-11-12 DIAGNOSIS — I739 Peripheral vascular disease, unspecified: Secondary | ICD-10-CM | POA: Diagnosis not present

## 2023-11-16 DIAGNOSIS — I10 Essential (primary) hypertension: Secondary | ICD-10-CM | POA: Diagnosis not present

## 2023-11-16 DIAGNOSIS — L97812 Non-pressure chronic ulcer of other part of right lower leg with fat layer exposed: Secondary | ICD-10-CM | POA: Diagnosis not present

## 2023-11-16 DIAGNOSIS — I872 Venous insufficiency (chronic) (peripheral): Secondary | ICD-10-CM | POA: Diagnosis not present

## 2023-11-16 DIAGNOSIS — F411 Generalized anxiety disorder: Secondary | ICD-10-CM | POA: Diagnosis not present

## 2023-11-16 DIAGNOSIS — M48061 Spinal stenosis, lumbar region without neurogenic claudication: Secondary | ICD-10-CM | POA: Diagnosis not present

## 2023-11-16 DIAGNOSIS — I739 Peripheral vascular disease, unspecified: Secondary | ICD-10-CM | POA: Diagnosis not present

## 2023-11-16 DIAGNOSIS — L97822 Non-pressure chronic ulcer of other part of left lower leg with fat layer exposed: Secondary | ICD-10-CM | POA: Diagnosis not present

## 2023-11-16 DIAGNOSIS — L03116 Cellulitis of left lower limb: Secondary | ICD-10-CM | POA: Diagnosis not present

## 2023-11-16 DIAGNOSIS — I480 Paroxysmal atrial fibrillation: Secondary | ICD-10-CM | POA: Diagnosis not present

## 2023-11-17 ENCOUNTER — Encounter: Payer: Self-pay | Admitting: Nurse Practitioner

## 2023-11-17 ENCOUNTER — Ambulatory Visit: Payer: Self-pay | Admitting: Nurse Practitioner

## 2023-11-17 VITALS — BP 126/60 | HR 54 | Wt 191.0 lb

## 2023-11-17 DIAGNOSIS — Z23 Encounter for immunization: Secondary | ICD-10-CM | POA: Diagnosis not present

## 2023-11-17 DIAGNOSIS — Z122 Encounter for screening for malignant neoplasm of respiratory organs: Secondary | ICD-10-CM | POA: Diagnosis not present

## 2023-11-17 DIAGNOSIS — Z7409 Other reduced mobility: Secondary | ICD-10-CM

## 2023-11-17 DIAGNOSIS — I480 Paroxysmal atrial fibrillation: Secondary | ICD-10-CM

## 2023-11-17 DIAGNOSIS — Z789 Other specified health status: Secondary | ICD-10-CM | POA: Insufficient documentation

## 2023-11-17 DIAGNOSIS — I10 Essential (primary) hypertension: Secondary | ICD-10-CM

## 2023-11-17 DIAGNOSIS — M48061 Spinal stenosis, lumbar region without neurogenic claudication: Secondary | ICD-10-CM | POA: Diagnosis not present

## 2023-11-17 DIAGNOSIS — R569 Unspecified convulsions: Secondary | ICD-10-CM

## 2023-11-17 DIAGNOSIS — S81801D Unspecified open wound, right lower leg, subsequent encounter: Secondary | ICD-10-CM

## 2023-11-17 DIAGNOSIS — F17200 Nicotine dependence, unspecified, uncomplicated: Secondary | ICD-10-CM | POA: Diagnosis not present

## 2023-11-17 DIAGNOSIS — E871 Hypo-osmolality and hyponatremia: Secondary | ICD-10-CM

## 2023-11-17 DIAGNOSIS — F1011 Alcohol abuse, in remission: Secondary | ICD-10-CM | POA: Diagnosis not present

## 2023-11-17 MED ORDER — AMOXICILLIN-POT CLAVULANATE 875-125 MG PO TABS: 1.0000 | ORAL_TABLET | Freq: Two times a day (BID) | ORAL | 0 refills | Status: DC

## 2023-11-17 MED ORDER — GABAPENTIN 300 MG PO CAPS: 300.0000 mg | ORAL_CAPSULE | Freq: Three times a day (TID) | ORAL | 4 refills | Status: AC

## 2023-11-17 MED ORDER — TIZANIDINE HCL 4 MG PO TABS: 4.0000 mg | ORAL_TABLET | Freq: Every day | ORAL | 1 refills | Status: DC | PRN

## 2023-11-17 NOTE — Assessment & Plan Note (Signed)
 BP Readings from Last 3 Encounters:  11/17/23 126/60  08/09/23 (!) 167/81  05/10/23 116/69  Controlled on diltiazem  360 mg daily Continue current medications

## 2023-11-17 NOTE — Assessment & Plan Note (Signed)
 On magnesium  oxide 400 mg twice daily Rechecking labs

## 2023-11-17 NOTE — Assessment & Plan Note (Signed)
 Continue aspirin  81 mg daily, Cardizem  360 mg daily

## 2023-11-17 NOTE — Assessment & Plan Note (Signed)
 Continues to smoke one pack per day. Smoking cessation encouraged due to impact on wound healing and cancer risk. - Encourage smoking cessation. - Reorder CT scan for lung cancer screening.

## 2023-11-17 NOTE — Progress Notes (Signed)
 Established Patient Office Visit  Subjective:  Patient ID: Billy Anderson, male    DOB: 12-Dec-1965  Age: 57 y.o. MRN: [id number]  CC:  Chief Complaint  Patient presents with   Medical Management of Chronic Issues    HPI    Discussed the use of AI scribe software for clinical note transcription with the patient, who gave verbal consent to proceed.  History of Present Illness Billy Anderson Billy Anderson is a 58 year old male   has a past medical history of Arthritis, Bipolar disorder (HCC), Dysrhythmia, GERD (gastroesophageal reflux disease), Hypertension, Pneumonia, and Seizures (HCC). who presents for follow-up after  hospitalization for cellulitis in July 2025.   He has been hospitalized three times for cellulitis, most recently in July. The cellulitis affected both legs, but the left leg has healed, still has a wound on the right leg, has a dressing on the right leg and he is now wearing compression sock on the left leg  No current drainage, redness, or pain. Home health care visits occur twice a week for dressing changes.   He had a seizure in July due to missed doses of lamotrigine  and gabapentin  while hospitalized. He has not seen a neurologist recently, and an appointment was missed and rescheduled for December. Gabapentin  was reduced to 100 mg three twice a day in the hospital, but it is not effective for his back pain, which is primarily in the lower back. He uses a walker for mobility.  He takes  tizanidine  as needed for muscle spasms, but he is currently out of the muscle relaxer.   He smokes one pack of cigarettes a day and has not quit smoking yet. He takes four sodium tablets a day, two in the morning and two at night, due to swelling in his feet.  He requested a prescription for a lift chair to avoid sales tax on a recent purchase. He has difficulty getting up from chairs without arms and uses a walker to prevent falls.   History of alcohol abuse he does not consume alcohol  currently, on naloxone 50 mg daily  No fever, chills, chest pain, or shortness of breath.  Assessment & Plan     Past Medical History:  Diagnosis Date   Arthritis    psoriatic arthritis   Bipolar disorder (HCC)    Dysrhythmia    PAF   GERD (gastroesophageal reflux disease)    Hypertension    Pneumonia    Seizures (HCC)     Past Surgical History:  Procedure Laterality Date   ANTERIOR CERVICAL DECOMP/DISCECTOMY FUSION N/A 03/20/2022   Procedure: Cervical Three-Four, Cervical Four-Five Anterior Cervical Discectomy Fusion;  Surgeon: Gillie Duncans, MD;  Location: [facility] OR;  Service: Neurosurgery;  Laterality: N/A;  RM 20 to follow 3C   HAND SURGERY Left    carpal tunnel release    Family History  Problem Relation Age of Onset   Bipolar disorder Mother     Social History   Socioeconomic History   Marital status: Married    Spouse name: Not on file   Number of children: 3   Years of education: Not on file   Highest education level: Not on file  Occupational History   Not on file  Tobacco Use   Smoking status: Every Day    Current packs/day: 1.00    Types: Cigarettes   Smokeless tobacco: Not on file  Vaping Use   Vaping status: Some Days  Substance and Sexual Activity  Alcohol use: Not Currently    Alcohol/week: 48.0 standard drinks of alcohol    Types: 48 Shots of liquor per week    Comment: 1 pint a day   Drug use: Never   Sexual activity: Not Currently  Other Topics Concern   Not on file  Social History Narrative   Lives with his spouse   Social Drivers of Health   Financial Resource Strain: Medium Risk (07/24/2020)   Received from [facility]   Overall Financial Resource Strain (CARDIA)    Difficulty of Paying Living Expenses: Somewhat hard  Food Insecurity: No Food Insecurity (08/07/2023)   Hunger Vital Sign    Worried About Running Out of Food in the Last Year: Never true    Ran Out of Food in the Last Year: Never true  Transportation Needs: No  Transportation Needs (08/07/2023)   PRAPARE - Administrator, Civil Service (Medical): No    Lack of Transportation (Non-Medical): No  Physical Activity: Not on file  Stress: Not on file  Social Connections: Not on file  Intimate Partner Violence: Unknown (08/07/2023)   Humiliation, Afraid, Rape, and Kick questionnaire    Fear of Current or Ex-Partner: Not on file    Emotionally Abused: No    Physically Abused: Not on file    Sexually Abused: Not on file    Outpatient Medications Prior to Visit  Medication Sig Dispense Refill   aspirin  EC 81 MG tablet Take 1 tablet (81 mg total) by mouth daily. 30 tablet 0   B Complex-C (SUPER B COMPLEX PO) Take 1 tablet by mouth daily.     Calcium  Carb-Cholecalciferol  (CALCIUM  600+D3 PO) Take 1 tablet by mouth 2 (two) times daily.     diltiazem  (CARDIZEM  CD) 180 MG 24 hr capsule TAKE 2 CAPSULES BY MOUTH DAILY 60 capsule 3   Fexofenadine HCl (ALLEGRA PO) Take 180 mg by mouth daily.     folic acid  (FOLVITE ) 1 MG tablet Take 1 tablet (1 mg total) by mouth daily. 30 tablet 0   Glucosamine 500 MG CAPS Take 1 capsule by mouth daily.     lamoTRIgine  (LAMICTAL ) 100 MG tablet Take 1 tablet (100 mg total) by mouth 2 (two) times daily. 60 tablet 2   magnesium  oxide (MAG-OX) 400 (240 Mg) MG tablet Take 400 mg by mouth 2 (two) times daily.     Melatonin-Pyridoxine  (MELATIN PO) Take 1 tablet by mouth as needed (for sleep).     naltrexone  (DEPADE) 50 MG tablet Take 1 tablet (50 mg total) by mouth daily. 30 tablet 2   OLANZapine  (ZYPREXA ) 20 MG tablet Take 1 tablet (20 mg total) by mouth at bedtime. 30 tablet 2   oxybutynin  (DITROPAN -XL) 10 MG 24 hr tablet TAKE 1 TABLET BY MOUTH AT BEDTIME 90 tablet 0   pantoprazole  (PROTONIX ) 40 MG tablet TAKE 1 TABLET BY MOUTH DAILY 30 tablet 3   sodium chloride  1 g tablet Take 2 tablets (2 g total) by mouth 2 (two) times daily with a meal. 180 tablet 0   thiamine  (VITAMIN B-1) 100 MG tablet Take 1 tablet (100 mg total)  by mouth daily. 30 tablet 0   gabapentin  (NEURONTIN ) 100 MG capsule Take 1 capsule (100 mg total) by mouth 2 (two) times daily. 180 capsule 1   tiZANidine  (ZANAFLEX ) 4 MG tablet Take 4 mg by mouth daily as needed for muscle spasms.     amoxicillin -clavulanate (AUGMENTIN ) 875-125 MG tablet Take 1 tablet by mouth every 12 (  twelve) hours. (Patient not taking: Reported on 11/17/2023) 20 tablet 0   No facility-administered medications prior to visit.    Allergies  Allergen Reactions   Ativan  [Lorazepam ] Other (See Comments)    Confusion  Hallucinations    Roxicodone [Oxycodone] Itching   Ultram [Tramadol] Other (See Comments)    Seizures    Vistaril [Hydroxyzine] Itching and Other (See Comments)    Sedation Not effective in reducing anxiety    ROS Review of Systems  Constitutional:  Negative for appetite change, chills, fatigue and fever.  HENT:  Negative for congestion, postnasal drip, rhinorrhea and sneezing.   Respiratory:  Negative for cough, shortness of breath and wheezing.   Cardiovascular:  Negative for chest pain, palpitations and leg swelling.  Gastrointestinal:  Negative for abdominal pain, constipation, nausea and vomiting.  Genitourinary:  Negative for difficulty urinating, dysuria, flank pain and frequency.  Musculoskeletal:  Positive for back pain. Negative for joint swelling and myalgias.  Skin:  Positive for color change and wound. Negative for pallor and rash.  Neurological:  Negative for dizziness, facial asymmetry, weakness, numbness and headaches.  Psychiatric/Behavioral:  Negative for behavioral problems, confusion, self-injury and suicidal ideas.       Objective:    Physical Exam Vitals and nursing note reviewed.  Constitutional:      General: He is not in acute distress.    Appearance: Normal appearance. He is not ill-appearing, toxic-appearing or diaphoretic.  Eyes:     General: No scleral icterus.       Right eye: No discharge.        Left eye: No  discharge.     Extraocular Movements: Extraocular movements intact.     Conjunctiva/sclera: Conjunctivae normal.  Cardiovascular:     Rate and Rhythm: Normal rate and regular rhythm.     Pulses: Normal pulses.     Heart sounds: Normal heart sounds. No murmur heard.    No friction rub. No gallop.  Pulmonary:     Effort: Pulmonary effort is normal. No respiratory distress.     Breath sounds: Normal breath sounds. No stridor. No wheezing, rhonchi or rales.  Chest:     Chest wall: No tenderness.  Abdominal:     General: There is no distension.     Palpations: Abdomen is soft.     Tenderness: There is no abdominal tenderness. There is no right CVA tenderness, left CVA tenderness or guarding.  Musculoskeletal:        General: No swelling, tenderness, deformity or signs of injury.     Comments: Decreased range of motion of the spine, using a walker for ambulation  Skin:    General: Skin is warm and dry.     Capillary Refill: Capillary refill takes less than 2 seconds.     Coloration: Skin is not jaundiced or pale.     Findings: No bruising or erythema.     Comments: Right leg dressing in place unable to assess Compression socks on left lower extremity, patient declined assessment No edema noted  Neurological:     Mental Status: He is alert and oriented to person, place, and time.     Motor: No weakness.     Gait: Gait abnormal.  Psychiatric:        Mood and Affect: Mood normal.        Behavior: Behavior normal.        Thought Content: Thought content normal.        Judgment: Judgment normal.  BP 126/60   Pulse (!) 54   Wt 191 lb (86.6 kg)   SpO2 98%   BMI 25.90 kg/m  Wt Readings from Last 3 Encounters:  11/17/23 191 lb (86.6 kg)  08/07/23 186 lb 11.7 oz (84.7 kg)  05/10/23 180 lb (81.6 kg)    Lab Results  Component Value Date   TSH 0.602 04/16/2023   Lab Results  Component Value Date   WBC 8.2 08/08/2023   HGB 12.4 (L) 08/08/2023   HCT 35.3 (L) 08/08/2023    MCV 97.8 08/08/2023   PLT 311 08/08/2023   Lab Results  Component Value Date   NA 128 (L) 08/09/2023   K 3.8 08/09/2023   CO2 23 08/09/2023   GLUCOSE 108 (H) 08/09/2023   BUN <5 (L) 08/09/2023   CREATININE 0.53 (L) 08/09/2023   BILITOT 0.8 08/07/2023   ALKPHOS 77 08/07/2023   AST 15 08/07/2023   ALT 12 08/07/2023   PROT 6.1 (L) 08/07/2023   ALBUMIN 2.7 (L) 08/07/2023   CALCIUM  8.2 (L) 08/09/2023   ANIONGAP 7 08/09/2023   EGFR 115 05/10/2023   No results found for: CHOL No results found for: HDL No results found for: LDLCALC No results found for: TRIG No results found for: [facility] Lab Results  Component Value Date   HGBA1C 4.8 08/07/2023      Assessment & Plan:   Problem List Items Addressed This Visit       Cardiovascular and Mediastinum   Essential hypertension   BP Readings from Last 3 Encounters:  11/17/23 126/60  08/09/23 (!) 167/81  05/10/23 116/69  Controlled on diltiazem  360 mg daily Continue current medications      Relevant Orders   CMP14+EGFR   Magnesium    Paroxysmal atrial fibrillation (HCC)   Continue aspirin  81 mg daily, Cardizem  360 mg daily        Other   Hyponatremia   Lab Results  Component Value Date   NA 128 (L) 08/09/2023   K 3.8 08/09/2023   CO2 23 08/09/2023   GLUCOSE 108 (H) 08/09/2023   BUN <5 (L) 08/09/2023   CREATININE 0.53 (L) 08/09/2023   CALCIUM  8.2 (L) 08/09/2023   EGFR 115 05/10/2023   GFRNONAA >60 08/09/2023   Managed with sodium chloride  tablets 2 g twice daily. Concerns about swelling, but reducing sodium intake could worsen hyponatremia and seizure risk. - Continue current sodium chloride  dosage. - Monitor sodium levels with lab work.      Tobacco use disorder   Continues to smoke one pack per day. Smoking cessation encouraged due to impact on wound healing and cancer risk. - Encourage smoking cessation. - Reorder CT scan for lung cancer screening.      Lumbar spinal stenosis   Uses a walker  for ambulation Increase gabapentin  to 300 mg 3 times daily      Relevant Medications   gabapentin  (NEURONTIN ) 300 MG capsule   tiZANidine  (ZANAFLEX ) 4 MG tablet   Seizure (HCC)   Continue lamotrigine  100 mg twice daily Follow-up with neurology as planned      Relevant Medications   gabapentin  (NEURONTIN ) 300 MG capsule   Alcohol abuse   In remission Continue  naproxen 50 mg daily      Need for influenza vaccination   Relevant Orders   Flu vaccine trivalent PF, 6mos and older(Flulaval,Afluria,Fluarix,Fluzone) (Completed)   Leg wound, right - Primary   Cellulitis of right lower limb  Right leg requires dressing changes. No drainage, pain, fever, or  systemic symptoms. Unable to access site , dressing in place, has a picture of the wound his phone  which looks like an ulcer  - Prescribed Augmentin  twice daily for 10 days - Continue home health care for dressing changes twice a week. - Advised use of compression socks and leg elevation.       Relevant Medications   amoxicillin -clavulanate (AUGMENTIN ) 875-125 MG tablet   Other Relevant Orders   CBC   Hypomagnesemia   On magnesium  oxide 400 mg twice daily Rechecking labs      Relevant Orders   Magnesium    Impaired mobility and ADLs   He requested a prescription for a lift chair to avoid sales tax on a recent purchase. He has difficulty getting up from chairs without arms and uses a walker to prevent falls. Also keeps his leg elevated when sitting to manage edema          Other Visit Diagnoses       Screening for lung cancer       Relevant Orders   CT CHEST LUNG CA SCREEN LOW DOSE W/O CM       Meds ordered this encounter  Medications   amoxicillin -clavulanate (AUGMENTIN ) 875-125 MG tablet    Sig: Take 1 tablet by mouth every 12 (twelve) hours.    Dispense:  20 tablet    Refill:  0   gabapentin  (NEURONTIN ) 300 MG capsule    Sig: Take 1 capsule (300 mg total) by mouth 3 (three) times daily.    Dispense:  90  capsule    Refill:  4   tiZANidine  (ZANAFLEX ) 4 MG tablet    Sig: Take 1 tablet (4 mg total) by mouth daily as needed for muscle spasms.    Dispense:  30 tablet    Refill:  1    Follow-up: Return in about 4 months (around 03/18/2024).    Mckenzee Beem R Khushi Zupko, FNP

## 2023-11-17 NOTE — Assessment & Plan Note (Addendum)
 In remission Continue  naproxen 50 mg daily

## 2023-11-17 NOTE — Assessment & Plan Note (Addendum)
 He requested a prescription for a lift chair to avoid sales tax on a recent purchase. He has difficulty getting up from chairs without arms and uses a walker to prevent falls. Also keeps his leg elevated when sitting to manage edema

## 2023-11-17 NOTE — Assessment & Plan Note (Signed)
 Continue lamotrigine  100 mg twice daily Follow-up with neurology as planned

## 2023-11-17 NOTE — Assessment & Plan Note (Signed)
 Lab Results  Component Value Date   NA 128 (L) 08/09/2023   K 3.8 08/09/2023   CO2 23 08/09/2023   GLUCOSE 108 (H) 08/09/2023   BUN <5 (L) 08/09/2023   CREATININE 0.53 (L) 08/09/2023   CALCIUM  8.2 (L) 08/09/2023   EGFR 115 05/10/2023   GFRNONAA >60 08/09/2023   Managed with sodium chloride  tablets 2 g twice daily. Concerns about swelling, but reducing sodium intake could worsen hyponatremia and seizure risk. - Continue current sodium chloride  dosage. - Monitor sodium levels with lab work.

## 2023-11-17 NOTE — Assessment & Plan Note (Signed)
 Uses a walker for ambulation Increase gabapentin  to 300 mg 3 times daily

## 2023-11-17 NOTE — Patient Instructions (Signed)
.   Need for influenza vaccination  - Flu vaccine trivalent PF, 6mos and older(Flulaval,Afluria,Fluarix,Fluzone)   . Cellulitis of right leg  - amoxicillin -clavulanate (AUGMENTIN ) 875-125 MG tablet; Take 1 tablet by mouth every 12 (twelve) hours.  Dispense: 20 tablet; Refill: 0   Spinal stenosis of lumbar region, unspecified whether neurogenic claudication present  - gabapentin  (NEURONTIN ) 300 MG capsule; Take 1 capsule (300 mg total) by mouth 3 (three) times daily.  Dispense: 90 capsule; Refill: 4    It is important that you exercise regularly at least 30 minutes 5 times a week as tolerated  Think about what you will eat, plan ahead. Choose  clean, green, fresh or frozen over canned, processed or packaged foods which are more sugary, salty and fatty. 70 to 75% of food eaten should be vegetables and fruit. Three meals at set times with snacks allowed between meals, but they must be fruit or vegetables. Aim to eat over a 12 hour period , example 7 am to 7 pm, and STOP after  your last meal of the day. Drink water,generally about 64 ounces per day, no other drink is as healthy. Fruit juice is best enjoyed in a healthy way, by EATING the fruit.  Thanks for choosing [facility] we consider it a privelige to serve you.

## 2023-11-17 NOTE — Assessment & Plan Note (Signed)
 Cellulitis of right lower limb  Right leg requires dressing changes. No drainage, pain, fever, or systemic symptoms. Unable to access site , dressing in place, has a picture of the wound his phone  which looks like an ulcer  - Prescribed Augmentin  twice daily for 10 days - Continue home health care for dressing changes twice a week. - Advised use of compression socks and leg elevation.

## 2023-11-19 DIAGNOSIS — I872 Venous insufficiency (chronic) (peripheral): Secondary | ICD-10-CM | POA: Diagnosis not present

## 2023-11-19 DIAGNOSIS — I480 Paroxysmal atrial fibrillation: Secondary | ICD-10-CM | POA: Diagnosis not present

## 2023-11-19 DIAGNOSIS — F411 Generalized anxiety disorder: Secondary | ICD-10-CM | POA: Diagnosis not present

## 2023-11-19 DIAGNOSIS — I10 Essential (primary) hypertension: Secondary | ICD-10-CM | POA: Diagnosis not present

## 2023-11-19 DIAGNOSIS — M48061 Spinal stenosis, lumbar region without neurogenic claudication: Secondary | ICD-10-CM | POA: Diagnosis not present

## 2023-11-19 DIAGNOSIS — I739 Peripheral vascular disease, unspecified: Secondary | ICD-10-CM | POA: Diagnosis not present

## 2023-11-19 DIAGNOSIS — L97822 Non-pressure chronic ulcer of other part of left lower leg with fat layer exposed: Secondary | ICD-10-CM | POA: Diagnosis not present

## 2023-11-19 DIAGNOSIS — L03116 Cellulitis of left lower limb: Secondary | ICD-10-CM | POA: Diagnosis not present

## 2023-11-19 DIAGNOSIS — L97812 Non-pressure chronic ulcer of other part of right lower leg with fat layer exposed: Secondary | ICD-10-CM | POA: Diagnosis not present

## 2023-11-23 DIAGNOSIS — L97812 Non-pressure chronic ulcer of other part of right lower leg with fat layer exposed: Secondary | ICD-10-CM | POA: Diagnosis not present

## 2023-11-23 DIAGNOSIS — I739 Peripheral vascular disease, unspecified: Secondary | ICD-10-CM | POA: Diagnosis not present

## 2023-11-23 DIAGNOSIS — F411 Generalized anxiety disorder: Secondary | ICD-10-CM | POA: Diagnosis not present

## 2023-11-23 DIAGNOSIS — L97822 Non-pressure chronic ulcer of other part of left lower leg with fat layer exposed: Secondary | ICD-10-CM | POA: Diagnosis not present

## 2023-11-23 DIAGNOSIS — I10 Essential (primary) hypertension: Secondary | ICD-10-CM | POA: Diagnosis not present

## 2023-11-23 DIAGNOSIS — M48061 Spinal stenosis, lumbar region without neurogenic claudication: Secondary | ICD-10-CM | POA: Diagnosis not present

## 2023-11-23 DIAGNOSIS — I872 Venous insufficiency (chronic) (peripheral): Secondary | ICD-10-CM | POA: Diagnosis not present

## 2023-11-23 DIAGNOSIS — L03116 Cellulitis of left lower limb: Secondary | ICD-10-CM | POA: Diagnosis not present

## 2023-11-23 DIAGNOSIS — I480 Paroxysmal atrial fibrillation: Secondary | ICD-10-CM | POA: Diagnosis not present

## 2023-11-26 DIAGNOSIS — L03116 Cellulitis of left lower limb: Secondary | ICD-10-CM | POA: Diagnosis not present

## 2023-11-26 DIAGNOSIS — I872 Venous insufficiency (chronic) (peripheral): Secondary | ICD-10-CM | POA: Diagnosis not present

## 2023-11-26 DIAGNOSIS — L97812 Non-pressure chronic ulcer of other part of right lower leg with fat layer exposed: Secondary | ICD-10-CM | POA: Diagnosis not present

## 2023-11-26 DIAGNOSIS — I10 Essential (primary) hypertension: Secondary | ICD-10-CM | POA: Diagnosis not present

## 2023-11-26 DIAGNOSIS — M48061 Spinal stenosis, lumbar region without neurogenic claudication: Secondary | ICD-10-CM | POA: Diagnosis not present

## 2023-11-26 DIAGNOSIS — L97822 Non-pressure chronic ulcer of other part of left lower leg with fat layer exposed: Secondary | ICD-10-CM | POA: Diagnosis not present

## 2023-11-26 DIAGNOSIS — I739 Peripheral vascular disease, unspecified: Secondary | ICD-10-CM | POA: Diagnosis not present

## 2023-11-26 DIAGNOSIS — I480 Paroxysmal atrial fibrillation: Secondary | ICD-10-CM | POA: Diagnosis not present

## 2023-11-26 DIAGNOSIS — F411 Generalized anxiety disorder: Secondary | ICD-10-CM | POA: Diagnosis not present

## 2023-11-30 ENCOUNTER — Other Ambulatory Visit: Payer: Self-pay

## 2023-11-30 DIAGNOSIS — L97812 Non-pressure chronic ulcer of other part of right lower leg with fat layer exposed: Secondary | ICD-10-CM | POA: Diagnosis not present

## 2023-11-30 DIAGNOSIS — Z7409 Other reduced mobility: Secondary | ICD-10-CM

## 2023-11-30 DIAGNOSIS — F411 Generalized anxiety disorder: Secondary | ICD-10-CM | POA: Diagnosis not present

## 2023-11-30 DIAGNOSIS — I872 Venous insufficiency (chronic) (peripheral): Secondary | ICD-10-CM | POA: Diagnosis not present

## 2023-11-30 DIAGNOSIS — L03116 Cellulitis of left lower limb: Secondary | ICD-10-CM | POA: Diagnosis not present

## 2023-11-30 DIAGNOSIS — M48061 Spinal stenosis, lumbar region without neurogenic claudication: Secondary | ICD-10-CM

## 2023-11-30 DIAGNOSIS — I480 Paroxysmal atrial fibrillation: Secondary | ICD-10-CM | POA: Diagnosis not present

## 2023-11-30 DIAGNOSIS — I739 Peripheral vascular disease, unspecified: Secondary | ICD-10-CM | POA: Diagnosis not present

## 2023-11-30 DIAGNOSIS — I10 Essential (primary) hypertension: Secondary | ICD-10-CM | POA: Diagnosis not present

## 2023-11-30 DIAGNOSIS — L97822 Non-pressure chronic ulcer of other part of left lower leg with fat layer exposed: Secondary | ICD-10-CM | POA: Diagnosis not present

## 2023-12-03 DIAGNOSIS — I10 Essential (primary) hypertension: Secondary | ICD-10-CM | POA: Diagnosis not present

## 2023-12-03 DIAGNOSIS — F411 Generalized anxiety disorder: Secondary | ICD-10-CM | POA: Diagnosis not present

## 2023-12-03 DIAGNOSIS — L97822 Non-pressure chronic ulcer of other part of left lower leg with fat layer exposed: Secondary | ICD-10-CM | POA: Diagnosis not present

## 2023-12-03 DIAGNOSIS — I872 Venous insufficiency (chronic) (peripheral): Secondary | ICD-10-CM | POA: Diagnosis not present

## 2023-12-03 DIAGNOSIS — I480 Paroxysmal atrial fibrillation: Secondary | ICD-10-CM | POA: Diagnosis not present

## 2023-12-03 DIAGNOSIS — L97812 Non-pressure chronic ulcer of other part of right lower leg with fat layer exposed: Secondary | ICD-10-CM | POA: Diagnosis not present

## 2023-12-03 DIAGNOSIS — L03116 Cellulitis of left lower limb: Secondary | ICD-10-CM | POA: Diagnosis not present

## 2023-12-03 DIAGNOSIS — M48061 Spinal stenosis, lumbar region without neurogenic claudication: Secondary | ICD-10-CM | POA: Diagnosis not present

## 2023-12-03 DIAGNOSIS — I739 Peripheral vascular disease, unspecified: Secondary | ICD-10-CM | POA: Diagnosis not present

## 2023-12-07 DIAGNOSIS — I739 Peripheral vascular disease, unspecified: Secondary | ICD-10-CM | POA: Diagnosis not present

## 2023-12-07 DIAGNOSIS — F411 Generalized anxiety disorder: Secondary | ICD-10-CM | POA: Diagnosis not present

## 2023-12-07 DIAGNOSIS — L03116 Cellulitis of left lower limb: Secondary | ICD-10-CM | POA: Diagnosis not present

## 2023-12-07 DIAGNOSIS — I480 Paroxysmal atrial fibrillation: Secondary | ICD-10-CM | POA: Diagnosis not present

## 2023-12-07 DIAGNOSIS — L97822 Non-pressure chronic ulcer of other part of left lower leg with fat layer exposed: Secondary | ICD-10-CM | POA: Diagnosis not present

## 2023-12-07 DIAGNOSIS — L97812 Non-pressure chronic ulcer of other part of right lower leg with fat layer exposed: Secondary | ICD-10-CM | POA: Diagnosis not present

## 2023-12-07 DIAGNOSIS — I872 Venous insufficiency (chronic) (peripheral): Secondary | ICD-10-CM | POA: Diagnosis not present

## 2023-12-07 DIAGNOSIS — I10 Essential (primary) hypertension: Secondary | ICD-10-CM | POA: Diagnosis not present

## 2023-12-07 DIAGNOSIS — M48061 Spinal stenosis, lumbar region without neurogenic claudication: Secondary | ICD-10-CM | POA: Diagnosis not present

## 2023-12-10 DIAGNOSIS — I10 Essential (primary) hypertension: Secondary | ICD-10-CM | POA: Diagnosis not present

## 2023-12-10 DIAGNOSIS — I739 Peripheral vascular disease, unspecified: Secondary | ICD-10-CM | POA: Diagnosis not present

## 2023-12-10 DIAGNOSIS — L97812 Non-pressure chronic ulcer of other part of right lower leg with fat layer exposed: Secondary | ICD-10-CM | POA: Diagnosis not present

## 2023-12-10 DIAGNOSIS — L03116 Cellulitis of left lower limb: Secondary | ICD-10-CM | POA: Diagnosis not present

## 2023-12-10 DIAGNOSIS — I872 Venous insufficiency (chronic) (peripheral): Secondary | ICD-10-CM | POA: Diagnosis not present

## 2023-12-10 DIAGNOSIS — M48061 Spinal stenosis, lumbar region without neurogenic claudication: Secondary | ICD-10-CM | POA: Diagnosis not present

## 2023-12-10 DIAGNOSIS — L97822 Non-pressure chronic ulcer of other part of left lower leg with fat layer exposed: Secondary | ICD-10-CM | POA: Diagnosis not present

## 2023-12-10 DIAGNOSIS — F411 Generalized anxiety disorder: Secondary | ICD-10-CM | POA: Diagnosis not present

## 2023-12-10 DIAGNOSIS — I480 Paroxysmal atrial fibrillation: Secondary | ICD-10-CM | POA: Diagnosis not present

## 2023-12-20 ENCOUNTER — Encounter: Payer: Self-pay | Admitting: Podiatry

## 2023-12-20 ENCOUNTER — Ambulatory Visit: Admitting: Podiatry

## 2023-12-20 DIAGNOSIS — D696 Thrombocytopenia, unspecified: Secondary | ICD-10-CM | POA: Diagnosis not present

## 2023-12-20 DIAGNOSIS — M79675 Pain in left toe(s): Secondary | ICD-10-CM | POA: Diagnosis not present

## 2023-12-20 DIAGNOSIS — M79674 Pain in right toe(s): Secondary | ICD-10-CM

## 2023-12-20 DIAGNOSIS — B351 Tinea unguium: Secondary | ICD-10-CM

## 2023-12-20 NOTE — Progress Notes (Signed)
 This patient returns to my office for at risk foot care.  This patient requires this care by a professional since this patient will be at risk due to having thrombocytopenia.  This patient is unable to cut nails himself since the patient cannot reach his nails.These nails are painful walking and wearing shoes.  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  General Appearance  Alert, conversant and in no acute stress.  Vascular  Deferred right leg/foot.  Dorsalis pedis and posterior pulse WNL left.  Neurologic  Deferred right leg/foot.  LOPS WNL left foot.  Nails Thick disfigured discolored nails with subungual debris  from hallux to fifth toes bilaterally. No evidence of bacterial infection or drainage bilaterally.  Orthopedic  No limitations of motion  feet .  No crepitus or effusions noted.  No bony pathology or digital deformities noted.  Skin  normotropic skin with no porokeratosis noted bilaterally.  No signs of infections or ulcers noted.     Onychomycosis  Pain in right toes  Pain in left toes  Consent was obtained for treatment procedures.   Mechanical debridement of nails 1-5  bilaterally performed with a nail nipper.  Filed with dremel without incident.    Return office visit     3 months                 Told patient to return for periodic foot care and evaluation due to potential at risk complications.   Cordella Bold DPM  tomma

## 2023-12-28 ENCOUNTER — Other Ambulatory Visit: Payer: Self-pay | Admitting: Nurse Practitioner

## 2023-12-29 ENCOUNTER — Encounter: Payer: Self-pay | Admitting: Neurology

## 2023-12-29 ENCOUNTER — Ambulatory Visit: Admitting: Neurology

## 2023-12-29 VITALS — BP 153/87 | HR 63 | Ht 72.0 in | Wt 191.0 lb

## 2023-12-29 DIAGNOSIS — G40009 Localization-related (focal) (partial) idiopathic epilepsy and epileptic syndromes with seizures of localized onset, not intractable, without status epilepticus: Secondary | ICD-10-CM

## 2023-12-29 DIAGNOSIS — M48061 Spinal stenosis, lumbar region without neurogenic claudication: Secondary | ICD-10-CM

## 2023-12-29 DIAGNOSIS — G959 Disease of spinal cord, unspecified: Secondary | ICD-10-CM

## 2023-12-29 DIAGNOSIS — F319 Bipolar disorder, unspecified: Secondary | ICD-10-CM | POA: Diagnosis not present

## 2023-12-29 MED ORDER — LAMOTRIGINE 100 MG PO TABS: 100.0000 mg | ORAL_TABLET | Freq: Two times a day (BID) | ORAL | 4 refills | Status: AC

## 2023-12-29 MED ORDER — TIZANIDINE HCL 4 MG PO TABS: 4.0000 mg | ORAL_TABLET | Freq: Every day | ORAL | 1 refills | Status: AC | PRN

## 2023-12-29 NOTE — Progress Notes (Signed)
 [facility]  PATIENT: Billy Anderson DOB: July 23, 1965  REQUESTING CLINICIAN: Paseda, Folashade R, FNP HISTORY FROM: Patient and spouse  REASON FOR VISIT: Establish care for his epilepsy    HISTORICAL  CHIEF COMPLAINT:  Chief Complaint  Patient presents with   RM 13    Partial idiopathic epilepsy with seizures of localized onset, not intractable, without status epilepticus; chronic L hand numbness and muscle aches in right hip/back area; alone    INTERVAL HISTORY 12/29/2023 Patient presents today for follow-up, last visit was in December 2023.  At that time we obtained a cervical spine MRI which showed multilevel severe stenosis with cord compression.  He did follow-up with Dr. Gillie, had ACDF surgery; he tells me following the surgery he still having difficulty with his hands weakness, but his symptoms are not getting worse.  He tells me he was seen at [facility] for a nerve conduction study. In April 2024, he was admitted to the hospital for cellulitis and during that admission his lamotrigine  was held and he did have a breakthrough seizure.  Since restarting lamotrigine , he has not had any seizure.  He denies any side effect with the medication.  He tells me that he continue to drink beer socially.   HISTORY OF PRESENT ILLNESS:  This is a 58 year old gentleman past medical history of seizure disorder, history of carpal tunnel syndrome left hand, chronic back pain, alcoholism in remission, hyponatremia improved who is presenting to establish care for his seizures.  Per wife seizure started first in December 2020, he had a second seizure in January 2021.  Seizure has been described as grand mal seizure.  Wife reported seizure always start with patient having a head deviation to the left followed by generalized convulsion.  Since then he had a total about 10 seizures and his last seizure was on October 2022.  He is compliant on the Lamotrigine  200 mg at night and  also gabapentin  300 mg twice daily.  Denies any side effects from the medication.  His seizure risk factors include history of head trauma and history of alcohol abuse.      Handedness: Right hand  Onset: December 2020  Seizure Type: Head deviation to the left followed by generalized convulsion  Current frequency: Last seizure October 2022  Any injuries from seizures: Fall, head injury  Seizure risk factors: Alcohol abuse, history of falls.  Previous ASMs: Lamotrigine , Depakote, Gabapentin   Currenty ASMs: Lamotrigine  100 mg twice daily, Gabapentin  300 mg BID   ASMs side effects: Denies  Brain Images: MRI brain July 2022 normal  Previous EEGs: EEG July 2022: Diffuse slowing    OTHER MEDICAL CONDITIONS: Seizure disorder, L carpal tunnel syndrome, chronic back pain, alcohol abuse in remission, hyponatremia improved   REVIEW OF SYSTEMS: Full 14 system review of systems performed and negative with exception of: As noted in the HPI   ALLERGIES: Allergies  Allergen Reactions   Ativan  [Lorazepam ] Other (See Comments)    Confusion  Hallucinations    Roxicodone [Oxycodone] Itching   Ultram [Tramadol] Other (See Comments)    Seizures    Vistaril [Hydroxyzine] Itching and Other (See Comments)    Sedation Not effective in reducing anxiety    HOME MEDICATIONS: Outpatient Medications Prior to Visit  Medication Sig Dispense Refill   aspirin  EC 81 MG tablet Take 1 tablet (81 mg total) by mouth daily. 30 tablet 0   B Complex-C (SUPER B COMPLEX PO) Take 1 tablet by mouth daily.  Calcium  Carb-Cholecalciferol  (CALCIUM  600+D3 PO) Take 1 tablet by mouth 2 (two) times daily.     diltiazem  (CARDIZEM  CD) 180 MG 24 hr capsule TAKE 2 CAPSULES BY MOUTH DAILY 180 capsule 1   Fexofenadine HCl (ALLEGRA PO) Take 180 mg by mouth daily.     folic acid  (FOLVITE ) 1 MG tablet Take 1 tablet (1 mg total) by mouth daily. 30 tablet 0   gabapentin  (NEURONTIN ) 300 MG capsule Take 1 capsule (300 mg  total) by mouth 3 (three) times daily. 90 capsule 4   Glucosamine 500 MG CAPS Take 1 capsule by mouth daily.     halobetasol (ULTRAVATE) 0.05 % cream Apply topically.     magnesium  oxide (MAG-OX) 400 (240 Mg) MG tablet Take 400 mg by mouth 2 (two) times daily.     Melatonin-Pyridoxine  (MELATIN PO) Take 1 tablet by mouth as needed (for sleep).     naltrexone  (DEPADE) 50 MG tablet Take 1 tablet (50 mg total) by mouth daily. 30 tablet 2   OLANZapine  (ZYPREXA ) 20 MG tablet Take 1 tablet (20 mg total) by mouth at bedtime. 30 tablet 2   oxybutynin  (DITROPAN -XL) 10 MG 24 hr tablet TAKE 1 TABLET BY MOUTH AT BEDTIME 90 tablet 0   pantoprazole  (PROTONIX ) 40 MG tablet TAKE 1 TABLET BY MOUTH DAILY 30 tablet 3   sodium chloride  1 g tablet Take 2 tablets (2 g total) by mouth 2 (two) times daily with a meal. 180 tablet 0   thiamine  (VITAMIN B-1) 100 MG tablet Take 1 tablet (100 mg total) by mouth daily. 30 tablet 0   lamoTRIgine  (LAMICTAL ) 100 MG tablet Take 1 tablet (100 mg total) by mouth 2 (two) times daily. 60 tablet 2   tiZANidine  (ZANAFLEX ) 4 MG tablet Take 1 tablet (4 mg total) by mouth daily as needed for muscle spasms. 30 tablet 1   amoxicillin -clavulanate (AUGMENTIN ) 875-125 MG tablet Take 1 tablet by mouth every 12 (twelve) hours. 20 tablet 0   diltiazem  (CARDIZEM  CD) 180 MG 24 hr capsule TAKE 2 CAPSULES BY MOUTH DAILY 60 capsule 3   No facility-administered medications prior to visit.    PAST MEDICAL HISTORY: Past Medical History:  Diagnosis Date   Arthritis    psoriatic arthritis   Bipolar disorder (HCC)    Dysrhythmia    PAF   GERD (gastroesophageal reflux disease)    Hypertension    Pneumonia    Seizures (HCC)     PAST SURGICAL HISTORY: Past Surgical History:  Procedure Laterality Date   ANTERIOR CERVICAL DECOMP/DISCECTOMY FUSION N/A 03/20/2022   Procedure: Cervical Three-Four, Cervical Four-Five Anterior Cervical Discectomy Fusion;  Surgeon: Gillie Duncans, MD;  Location: [facility] OR;   Service: Neurosurgery;  Laterality: N/A;  RM 20 to follow 3C   HAND SURGERY Left    carpal tunnel release    FAMILY HISTORY: Family History  Problem Relation Age of Onset   Bipolar disorder Mother     SOCIAL HISTORY: Social History   Socioeconomic History   Marital status: Married    Spouse name: Not on file   Number of children: 3   Years of education: Not on file   Highest education level: Not on file  Occupational History   Not on file  Tobacco Use   Smoking status: Every Day    Current packs/day: 1.00    Types: Cigarettes   Smokeless tobacco: Not on file  Vaping Use   Vaping status: Some Days  Substance and Sexual Activity   Alcohol  use: Not Currently    Alcohol/week: 48.0 standard drinks of alcohol    Types: 48 Shots of liquor per week    Comment: 1 pint a day   Drug use: Never   Sexual activity: Not Currently  Other Topics Concern   Not on file  Social History Narrative   Lives with his spouse   Social Drivers of Health   Financial Resource Strain: Medium Risk (07/24/2020)   Received from [facility]   Overall Financial Resource Strain (CARDIA)    Difficulty of Paying Living Expenses: Somewhat hard  Food Insecurity: No Food Insecurity (08/07/2023)   Hunger Vital Sign    Worried About Running Out of Food in the Last Year: Never true    Ran Out of Food in the Last Year: Never true  Transportation Needs: No Transportation Needs (08/07/2023)   PRAPARE - Administrator, Civil Service (Medical): No    Lack of Transportation (Non-Medical): No  Physical Activity: Not on file  Stress: Not on file  Social Connections: Not on file  Intimate Partner Violence: Unknown (08/07/2023)   Humiliation, Afraid, Rape, and Kick questionnaire    Fear of Current or Ex-Partner: Not on file    Emotionally Abused: No    Physically Abused: Not on file    Sexually Abused: Not on file    PHYSICAL EXAM  GENERAL EXAM/CONSTITUTIONAL: Vitals:  Vitals:   12/29/23  1152  BP: (!) 153/87  Pulse: 63  Weight: 191 lb (86.6 kg)  Height: 6' (1.829 m)    Body mass index is 25.9 kg/m. Wt Readings from Last 3 Encounters:  12/29/23 191 lb (86.6 kg)  11/17/23 191 lb (86.6 kg)  08/07/23 186 lb 11.7 oz (84.7 kg)   Patient is in no distress; well developed, nourished and groomed; neck is supple  MUSCULOSKELETAL: Gait, strength, tone, movements noted in Neurologic exam below  NEUROLOGIC: MENTAL STATUS:      No data to display         awake, alert, oriented to person, place and time recent and remote memory intact normal attention and concentration language fluent, comprehension intact, naming intact fund of knowledge appropriate  CRANIAL NERVE:  2nd, 3rd, 4th, 6th - Visual fields full to confrontation, extraocular muscles intact, no nystagmus 5th - facial sensation symmetric 7th - facial strength symmetric 8th - hearing intact 9th - palate elevates symmetrically, uvula midline 11th - shoulder shrug symmetric 12th - tongue protrusion midline  MOTOR:  He has atrophy of both thenar and hypothenar aspects of the hand, left worse than right.   SENSORY:  normal and symmetric to light touch  COORDINATION:  finger-nose-finger, fine finger movements normal  REFLEXES:  Loss of right biceps reflex and bilateral patellar reflex. Left biceps reflex is present  GAIT/STATION:  Ambulates with a walker   DIAGNOSTIC DATA (LABS, IMAGING, TESTING) - I reviewed patient records, labs, notes, testing and imaging myself where available.  Lab Results  Component Value Date   WBC 8.2 08/08/2023   HGB 12.4 (L) 08/08/2023   HCT 35.3 (L) 08/08/2023   MCV 97.8 08/08/2023   PLT 311 08/08/2023      Component Value Date/Time   NA 128 (L) 08/09/2023 0339   NA 129 (L) 05/10/2023 1542   K 3.8 08/09/2023 0339   CL 98 08/09/2023 0339   CO2 23 08/09/2023 0339   GLUCOSE 108 (H) 08/09/2023 0339   BUN <5 (L) 08/09/2023 0339   BUN 5 (L) 05/10/2023 1542  CREATININE 0.53 (L) 08/09/2023 0339   CALCIUM  8.2 (L) 08/09/2023 0339   PROT 6.1 (L) 08/07/2023 1656   PROT 6.8 05/10/2023 1542   ALBUMIN 2.7 (L) 08/07/2023 1656   ALBUMIN 3.9 05/10/2023 1542   AST 15 08/07/2023 1656   ALT 12 08/07/2023 1656   ALKPHOS 77 08/07/2023 1656   BILITOT 0.8 08/07/2023 1656   BILITOT 0.4 05/10/2023 1542   GFRNONAA >60 08/09/2023 0339   No results found for: CHOL, HDL, LDLCALC, LDLDIRECT, TRIG Lab Results  Component Value Date   HGBA1C 4.8 08/07/2023   Lab Results  Component Value Date   VITAMINB12 2,320 (H) 10/17/2021   Lab Results  Component Value Date   TSH 0.602 04/16/2023    MRI brain 08/16/2020 No acute intracranial abnormality. Chronic microvascular and atrophic changes as noted above.   EEG 08/16/2020 This EEG is borderline abnormal for some mild background slowing.   MRI Cervical spine 02/15/2022 Abnormal MRI scan cervical spine without contrast showing prominent spondylitic changes throughout most severe at C3/4 and C4-5 where there is multi factorial spinal stenosis with cord compression and severe bilateral foraminal narrowing and possible encroachment on the exiting nerve roots. There are also ill-defined spinal cord hyperintensities at C2, C4 and C6 which are of unclear significance and may represent remote age demyelinating plaques    ASSESSMENT AND PLAN  58 y.o. year old male  with history of seizure disorder, chronic back pain, alcoholism, cervical myelopathy status post ACDF surgery who is presenting for follow-up.  In terms of the seizures, he did have a breakthrough seizure in April in the setting of the hospital holding his lamotrigine .  Since then lamotrigine  has been restarted, currently taking 100 mg twice daily and he has not had any additional seizures.  He is doing well.  When he comes to his bilateral hands weakness, unfortunately I told patient that he may not have complete use of his hands, normal fine finger  movement, normal dexterity due to the severity of the cervical myelopathy, but I did advise him to continue with exercises of his hands and fingers.  He is complaining of low back pain, I did advise him to follow-up with [facility] since he is a patient there.  For his epilepsy will continue to see him on a yearly basis.    1. Partial idiopathic epilepsy with seizures of localized onset, not intractable, without status epilepticus (HCC)   2. Cervical myelopathy (HCC)   3. Bipolar I disorder (HCC)   4. Spinal stenosis of lumbar region, unspecified whether neurogenic claudication present      Patient Instructions  Continue with current medications, including lamotrigine  100 mg twice daily Will prescribe patient tizanidine  for his back spasm Continue follow-up with [facility] for the back pain/spasm Follow-up in 1 year for epilepsy or sooner if worse  Per [place] statutes, patients with seizures are not allowed to drive until they have been seizure-free for six months.  Other recommendations include using caution when using heavy equipment or power tools. Avoid working on ladders or at heights. Take showers instead of baths.  Do not swim alone.  Ensure the water temperature is not too high on the home water heater. Do not go swimming alone. Do not lock yourself in a room alone (i.e. bathroom). When caring for infants or small children, sit down when holding, feeding, or changing them to minimize risk of injury to the child in the event you have a seizure. Maintain good sleep hygiene. Avoid alcohol.  Also recommend adequate sleep, hydration, good diet and minimize stress.   During the Seizure  - First, ensure adequate ventilation and place patients on the floor on their left side  Loosen clothing around the neck and ensure the airway is patent. If the patient is clenching the teeth, do not force the mouth open with any object as this can cause severe damage - Remove all items from  the surrounding that can be hazardous. The patient may be oblivious to what's happening and may not even know what he or she is doing. If the patient is confused and wandering, either gently guide him/her away and block access to outside areas - Reassure the individual and be comforting - Call 911. In most cases, the seizure ends before EMS arrives. However, there are cases when seizures may last over 3 to 5 minutes. Or the individual may have developed breathing difficulties or severe injuries. If a pregnant patient or a person with diabetes develops a seizure, it is prudent to call an ambulance. - Finally, if the patient does not regain full consciousness, then call EMS. Most patients will remain confused for about 45 to 90 minutes after a seizure, so you must use judgment in calling for help. - Avoid restraints but make sure the patient is in a bed with padded side rails - Place the individual in a lateral position with the neck slightly flexed; this will help the saliva drain from the mouth and prevent the tongue from falling backward - Remove all nearby furniture and other hazards from the area - Provide verbal assurance as the individual is regaining consciousness - Provide the patient with privacy if possible - Call for help and start treatment as ordered by the caregiver   After the Seizure (Postictal Stage)  After a seizure, most patients experience confusion, fatigue, muscle pain and/or a headache. Thus, one should permit the individual to sleep. For the next few days, reassurance is essential. Being calm and helping reorient the person is also of importance.  Most seizures are painless and end spontaneously. Seizures are not harmful to others but can lead to complications such as stress on the lungs, brain and the heart. Individuals with prior lung problems may develop labored breathing and respiratory distress.     No orders of the defined types were placed in this encounter.   Meds  ordered this encounter  Medications   lamoTRIgine  (LAMICTAL ) 100 MG tablet    Sig: Take 1 tablet (100 mg total) by mouth 2 (two) times daily.    Dispense:  180 tablet    Refill:  4   tiZANidine  (ZANAFLEX ) 4 MG tablet    Sig: Take 1 tablet (4 mg total) by mouth daily as needed for muscle spasms.    Dispense:  30 tablet    Refill:  1    Return in about 1 year (around 12/28/2024).    Pastor Falling, MD 12/29/2023, 12:30 PM  [facility] [address], [address] [place], [place] [address] [phone]

## 2023-12-29 NOTE — Patient Instructions (Signed)
 Continue with current medications, including lamotrigine  100 mg twice daily Will prescribe patient tizanidine  for his back spasm Continue follow-up with [facility] for the back pain/spasm Follow-up in 1 year for epilepsy or sooner if worse

## 2024-01-06 ENCOUNTER — Telehealth (HOSPITAL_COMMUNITY): Admitting: Psychiatry

## 2024-01-07 ENCOUNTER — Telehealth (HOSPITAL_COMMUNITY): Admitting: Psychiatry

## 2024-01-21 ENCOUNTER — Other Ambulatory Visit: Payer: Self-pay | Admitting: Nurse Practitioner

## 2024-01-21 DIAGNOSIS — N3281 Overactive bladder: Secondary | ICD-10-CM

## 2024-01-21 NOTE — Telephone Encounter (Signed)
 oxybutynin  (DITROPAN -XL) 10 MG 24 hr tablet [Pharmacy Med Name: oxyBUTYnin  CL ER 10 MG TABLET]

## 2024-01-24 ENCOUNTER — Other Ambulatory Visit: Payer: Self-pay | Admitting: Nurse Practitioner

## 2024-01-24 DIAGNOSIS — N3281 Overactive bladder: Secondary | ICD-10-CM

## 2024-01-24 MED ORDER — OXYBUTYNIN CHLORIDE ER 10 MG PO TB24: 10.0000 mg | ORAL_TABLET | Freq: Every day | ORAL | 1 refills | Status: AC

## 2024-02-01 ENCOUNTER — Telehealth (HOSPITAL_COMMUNITY): Admitting: Psychiatry

## 2024-02-01 ENCOUNTER — Encounter (HOSPITAL_COMMUNITY): Payer: Self-pay | Admitting: Psychiatry

## 2024-02-01 VITALS — Wt 191.0 lb

## 2024-02-01 DIAGNOSIS — F1021 Alcohol dependence, in remission: Secondary | ICD-10-CM

## 2024-02-01 DIAGNOSIS — F319 Bipolar disorder, unspecified: Secondary | ICD-10-CM | POA: Diagnosis not present

## 2024-02-01 MED ORDER — OLANZAPINE 20 MG PO TABS: 20.0000 mg | ORAL_TABLET | Freq: Every day | ORAL | 2 refills | Status: AC

## 2024-02-01 MED ORDER — NALTREXONE HCL 50 MG PO TABS: 50.0000 mg | ORAL_TABLET | Freq: Every day | ORAL | 2 refills | Status: AC

## 2024-02-01 NOTE — Progress Notes (Signed)
 " [facility] MD Virtual Progress Note   Patient Location: Home Provider Location: Home Office  I connect with patient by video and verified that I am speaking with correct person by using two identifiers. I discussed the limitations of evaluation and management by telemedicine and the availability of in person appointments. I also discussed with the patient that there may be a patient responsible charge related to this service. The patient expressed understanding and agreed to proceed.  Billy Anderson [id number] 59 y.o.  02/01/2024 10:47 AM  History of Present Illness:  Patient is evaluated by video session.  He recently had a visit with neurologist had no change in the medication.  He is on Lamictal  by neurology and he also takes gabapentin .  Denies any irritability, anger, mania, psychosis.  He remains sober from drinking and has been more than a year that he had drink alcohol.  Patient told his girlfriend lives with him with her 2 grownup children.  He had good holidays.  His appetite is okay.  He takes naltrexone  which is helping to stay away from drinking.  He does go outside when weather is good and sits on the porch.  He still does not like going outside unless he is accompanied by someone.  He has no rash itching tremor or shakes.  He wants to keep his current medication.  His appetite is okay and his weight is stable.  He sleeps okay.  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  Started drinking at age 76. Multiple rehab for alcohol addiction. H/O low sodium and seizure. Tried lithium, Seroquel, Vraylar with poor outcome.  Given Celexa  by PCP but stopped.  No history of suicidal attempt.  History of mania with excessive spending and grandiosity.  No history of abuse.    Past Medical History:  Diagnosis Date   Arthritis    psoriatic arthritis   Bipolar disorder (HCC)    Dysrhythmia    PAF   GERD (gastroesophageal reflux  disease)    Hypertension    Pneumonia    Seizures (HCC)     Outpatient Encounter Medications as of 02/01/2024  Medication Sig   aspirin  EC 81 MG tablet Take 1 tablet (81 mg total) by mouth daily.   B Complex-C (SUPER B COMPLEX PO) Take 1 tablet by mouth daily.   Calcium  Carb-Cholecalciferol  (CALCIUM  600+D3 PO) Take 1 tablet by mouth 2 (two) times daily.   diltiazem  (CARDIZEM  CD) 180 MG 24 hr capsule TAKE 2 CAPSULES BY MOUTH DAILY   Fexofenadine HCl (ALLEGRA PO) Take 180 mg by mouth daily.   folic acid  (FOLVITE ) 1 MG tablet Take 1 tablet (1 mg total) by mouth daily.   gabapentin  (NEURONTIN ) 300 MG capsule Take 1 capsule (300 mg total) by mouth 3 (three) times daily.   Glucosamine 500 MG CAPS Take 1 capsule by mouth daily.   halobetasol (ULTRAVATE) 0.05 % cream Apply topically.   lamoTRIgine  (LAMICTAL ) 100 MG tablet Take 1 tablet (100 mg total) by mouth 2 (two) times daily.   magnesium  oxide (MAG-OX) 400 (240 Mg) MG tablet Take 400 mg by mouth 2 (two) times daily.   Melatonin-Pyridoxine  (MELATIN PO) Take 1 tablet by mouth as needed (for sleep).   naltrexone  (DEPADE) 50 MG tablet Take 1 tablet (50 mg total) by mouth daily.   OLANZapine  (ZYPREXA ) 20 MG tablet Take 1 tablet (20 mg total) by mouth at bedtime.   oxybutynin  (DITROPAN -XL) 10 MG 24 hr tablet Take 1  tablet (10 mg total) by mouth at bedtime.   pantoprazole  (PROTONIX ) 40 MG tablet TAKE 1 TABLET BY MOUTH DAILY   sodium chloride  1 g tablet Take 2 tablets (2 g total) by mouth 2 (two) times daily with a meal.   thiamine  (VITAMIN B-1) 100 MG tablet Take 1 tablet (100 mg total) by mouth daily.   tiZANidine  (ZANAFLEX ) 4 MG tablet Take 1 tablet (4 mg total) by mouth daily as needed for muscle spasms.   No facility-administered encounter medications on file as of 02/01/2024.    No results found for this or any previous visit (from the past 2160 hours).    Psychiatric Specialty Exam: Physical Exam  Review of Systems  There were no  vitals taken for this visit.There is no height or weight on file to calculate BMI.  General Appearance: Fairly Groomed  Eye Contact:  Fair  Speech:  Slow  Volume:  Decreased  Mood:  Euthymic  Affect:  Congruent  Thought Process:  Descriptions of Associations: Intact  Orientation:  Full (Time, Place, and Person)  Thought Content:  WDL  Suicidal Thoughts:  No  Homicidal Thoughts:  No  Memory:  Immediate;   Fair Recent;   Fair Remote;   Fair  Judgement:  Intact  Insight:  Shallow  Psychomotor Activity:  Decreased  Concentration:  Concentration: Fair and Attention Span: Fair  Recall:  [employer] of Knowledge:  Fair  Language:  Fair  Akathisia:  No  Handed:  Right  AIMS (if indicated):     Assets:  Communication Skills Desire for Improvement Housing Social Support  ADL's:  Intact  Cognition:  WNL  Sleep:  8-9 hrs. Sometimes nap in day time       11/17/2023    3:51 PM 05/10/2023    2:44 PM 01/26/2023    2:57 PM 12/24/2021    3:49 PM  Depression screen PHQ 2/9  Decreased Interest 0 1 0 0  Down, Depressed, Hopeless 0 1 0 0  PHQ - 2 Score 0 2 0 0  Altered sleeping 0 3    Tired, decreased energy 0 3    Change in appetite 0 1    Feeling bad or failure about yourself  0 1    Trouble concentrating 0 0    Moving slowly or fidgety/restless 0 0    Suicidal thoughts 0 0    PHQ-9 Score 0  10     Difficult doing work/chores Not difficult at all Not difficult at all       Data saved with a previous flowsheet row definition    Assessment/Plan: Bipolar I disorder (HCC) - Plan: naltrexone  (DEPADE) 50 MG tablet, OLANZapine  (ZYPREXA ) 20 MG tablet  Alcohol use disorder, moderate, in early remission (HCC) - Plan: naltrexone  (DEPADE) 50 MG tablet, OLANZapine  (ZYPREXA ) 20 MG tablet  Patient is 59 year old man with history of hypertension, A-fib, seizure disorder, alcohol use disorder and bipolar disorder.  He is stable on his current medication.  He remains sober from drinking which he  believe at least a year.  He has no side effects from the medication.  Continue Zyprexa  20 mg at bedtime and naltrexone  20 mg daily.  He is also on Lamictal  and gabapentin  from other provider.  Recommend to call back if is any question or any concern.  Follow-up in 3 months.  Follow Up Instructions:     I discussed the assessment and treatment plan with the patient. The patient was provided an opportunity to  ask questions and all were answered. The patient agreed with the plan and demonstrated an understanding of the instructions.   The patient was advised to call back or seek an in-person evaluation if the symptoms worsen or if the condition fails to improve as anticipated.    Collaboration of Care: Other provider involved in patient's care AEB notes are available in epic to review  Patient/Guardian was advised Release of Information must be obtained prior to any record release in order to collaborate their care with an outside provider. Patient/Guardian was advised if they have not already done so to contact the registration department to sign all necessary forms in order for us  to release information regarding their care.   Consent: Patient/Guardian gives verbal consent for treatment and assignment of benefits for services provided during this visit. Patient/Guardian expressed understanding and agreed to proceed.     Total encounter time 17 minutes which includes face-to-face time, chart reviewed, care coordination, order entry and documentation during this encounter.   Note: This document was prepared by [employer] voice dictation technology and any errors that results from this process are unintentional.    Leni ONEIDA Client, MD 02/01/2024   "

## 2024-03-15 ENCOUNTER — Ambulatory Visit: Payer: Self-pay | Admitting: Nurse Practitioner

## 2024-03-20 ENCOUNTER — Ambulatory Visit: Admitting: Podiatry

## 2024-05-02 ENCOUNTER — Telehealth (HOSPITAL_COMMUNITY): Admitting: Psychiatry

## 2024-12-28 ENCOUNTER — Ambulatory Visit: Admitting: Neurology
